# Patient Record
Sex: Male | Born: 1935
Health system: Southern US, Community
[De-identification: ages and names within clinical notes are randomized; demographics above are authoritative.]

## PROBLEM LIST (undated history)

## (undated) DIAGNOSIS — Z9289 Personal history of other medical treatment: Secondary | ICD-10-CM

## (undated) DIAGNOSIS — M1990 Unspecified osteoarthritis, unspecified site: Secondary | ICD-10-CM

## (undated) DIAGNOSIS — S72001A Fracture of unspecified part of neck of right femur, initial encounter for closed fracture: Secondary | ICD-10-CM

## (undated) DIAGNOSIS — R001 Bradycardia, unspecified: Secondary | ICD-10-CM

## (undated) DIAGNOSIS — H269 Unspecified cataract: Secondary | ICD-10-CM

## (undated) DIAGNOSIS — R2689 Other abnormalities of gait and mobility: Secondary | ICD-10-CM

## (undated) DIAGNOSIS — I10 Essential (primary) hypertension: Secondary | ICD-10-CM

## (undated) DIAGNOSIS — G4733 Obstructive sleep apnea (adult) (pediatric): Secondary | ICD-10-CM

## (undated) DIAGNOSIS — N400 Enlarged prostate without lower urinary tract symptoms: Secondary | ICD-10-CM

## (undated) DIAGNOSIS — G20A1 Parkinson's disease without dyskinesia, without mention of fluctuations: Secondary | ICD-10-CM

## (undated) DIAGNOSIS — G20 Parkinson's disease: Secondary | ICD-10-CM

## (undated) DIAGNOSIS — I4891 Unspecified atrial fibrillation: Secondary | ICD-10-CM

## (undated) DIAGNOSIS — S92109A Unspecified fracture of unspecified talus, initial encounter for closed fracture: Secondary | ICD-10-CM

## (undated) DIAGNOSIS — I2510 Atherosclerotic heart disease of native coronary artery without angina pectoris: Secondary | ICD-10-CM

## (undated) DIAGNOSIS — K219 Gastro-esophageal reflux disease without esophagitis: Secondary | ICD-10-CM

## (undated) DIAGNOSIS — I442 Atrioventricular block, complete: Secondary | ICD-10-CM

## (undated) HISTORY — DX: Unspecified cataract: H26.9

## (undated) HISTORY — PX: PROSTATE BIOPSY: SHX241

## (undated) HISTORY — DX: Benign prostatic hyperplasia without lower urinary tract symptoms: N40.0

## (undated) HISTORY — PX: FRACTURE SURGERY: SHX138

## (undated) HISTORY — DX: Obstructive sleep apnea (adult) (pediatric): G47.33

## (undated) HISTORY — PX: INGUINAL HERNIA REPAIR: SUR1180

## (undated) HISTORY — PX: COLONOSCOPY: SHX174

## (undated) HISTORY — DX: Bradycardia, unspecified: R00.1

## (undated) HISTORY — PX: HEMORROIDECTOMY: SUR656

## (undated) HISTORY — DX: Other abnormalities of gait and mobility: R26.89

## (undated) HISTORY — DX: Atherosclerotic heart disease of native coronary artery without angina pectoris: I25.10

## (undated) HISTORY — DX: Personal history of other medical treatment: Z92.89

## (undated) HISTORY — DX: Unspecified atrial fibrillation: I48.91

---

## 2005-04-08 ENCOUNTER — Ambulatory Visit: Payer: Self-pay | Admitting: Cardiology

## 2005-05-14 ENCOUNTER — Ambulatory Visit: Payer: Self-pay | Admitting: Cardiology

## 2005-05-14 ENCOUNTER — Encounter: Payer: Self-pay | Admitting: Cardiology

## 2005-05-14 ENCOUNTER — Ambulatory Visit: Payer: Self-pay

## 2005-06-22 ENCOUNTER — Ambulatory Visit: Payer: Self-pay | Admitting: Internal Medicine

## 2005-09-14 ENCOUNTER — Ambulatory Visit: Payer: Self-pay | Admitting: Cardiology

## 2007-09-09 ENCOUNTER — Ambulatory Visit (HOSPITAL_COMMUNITY): Admission: RE | Admit: 2007-09-09 | Discharge: 2007-09-09 | Payer: Self-pay | Admitting: Emergency Medicine

## 2007-09-09 ENCOUNTER — Ambulatory Visit: Payer: Self-pay | Admitting: *Deleted

## 2007-09-09 ENCOUNTER — Encounter: Payer: Self-pay | Admitting: Emergency Medicine

## 2008-12-24 ENCOUNTER — Encounter: Payer: Self-pay | Admitting: Cardiology

## 2010-07-10 NOTE — Assessment & Plan Note (Signed)
[facility]                              CARDIOLOGY OFFICE NOTE   NAME:Chad Russell, Chad Russell                      MRN:          [id number]  DATE:09/14/2005                            DOB:          11-15-1926    PRIMARY CARE PHYSICIAN:  Jonita Albee, MD   REASON FOR PRESENTATION:  Evaluate patient with complete heart block.   HISTORY OF PRESENT ILLNESS:  The patient returns for followup of the above.  He has seen Dr. Graciela Husbands in consultation.  He does have a complete heart block,  but no absolute indication for a pacemaker.  Dr. Graciela Husbands reviewed this at the  [employer].  There was a split opinion about whether the  electrophysiology experts at that meeting would put in a pacemaker.  This  has been discussed at length now with Chad Russell.  He has done quite a bit of  research and comes today to discuss this.   He still denies any symptoms.  He is quite active. He denies any presyncope  or syncope.  He has had no chest pain.  He has had no shortness of breath.  He has no palpitations.   PAST MEDICAL HISTORY:  1.  Hypertension.  2.  Bradycardia.  3.  Hemorrhoidectomy.   ALLERGIES:  None.   MEDICATIONS:  1.  Norvasc 10 mg daily.  2.  Aspirin 1/4 of a 325 mg daily.  3.  Ocuvite.  4.  Saw palmetto.  5.  Multivitamin.   REVIEW OF SYSTEMS:  Positive for mild lower extremity swelling.   PHYSICAL EXAMINATION:  GENERAL:  The patient is in no distress.  VITAL SIGNS:  Blood pressure 146/90, heart rate 40 and regular.  HEENT:  Eyes unremarkable, pupils equal, round and reactive to light, fundi  not visualized.  NECK:  No jugular venous distention, 45 degrees, carotid upstroke brisk and  symmetric, no bruits, no thyromegaly.  LYMPHATICS:  No adenopathy.  LUNGS:  Clear to auscultation bilaterally.  BACK:  No costovertebral angle tenderness.  CHEST:  Unremarkable.  HEART:  PMI not displaced or sustained, S1 and S2 within normal limits, no  S3, no  S4.  No murmurs.  ABDOMEN: Flat, positive bowel sounds, normal in frequency and pitch, no  bruits, no rebound, no guarding, no midline pulse, no masses, no  organomegaly.  SKIN:  No rashes, no nodules.  EXTREMITIES:  2+ pulses throughout.  No edema, no cyanosis, no clubbing.  NEUROLOGIC:  Grossly intact throughout.   EKG sinus rhythm, rate 60, complete heart block, narrow complex escape  rhythm, rate 40, no acute ST-T wave changes.   ASSESSMENT AND PLAN:  1.  Heart block.  Again we have had a very long discussion (greater than      half hour) with this entity.  There is no class I or IIA indication for      pacing at this point.  There is a very slight risk potentially of sudden      cardiac death, and we have reviewed this.  There is a risk of symptoms  to include syncope.  However, this is weighed against the risks of      putting in a pacemaker, which I have described in detail.  I would      suggest a pacemaker.  However, the patient at this point would still      like to watch this expectantly.  He will let me know at the first sign      of any symptoms.  2.  Hypertension.  His blood pressure is very slightly elevated.  He thinks      he has a little white coat.  He is tolerating the Norvasc.  He will keep      a blood pressure diary and share this with Dr. Perrin Maltese or myself.  3.  Followup.  See him back in 6 months, or sooner if needed.                               Rollene Rotunda, MD, [facility]    JH/MedQ  DD:  09/14/2005  DT:  09/15/2005  Job #:  [id number]   cc:   Jonita Albee, MD

## 2011-03-11 ENCOUNTER — Ambulatory Visit: Payer: Medicare Other | Attending: Cardiology | Admitting: Physical Therapy

## 2011-03-15 ENCOUNTER — Emergency Department (HOSPITAL_COMMUNITY): Payer: Medicare Other

## 2011-03-15 ENCOUNTER — Inpatient Hospital Stay (HOSPITAL_COMMUNITY)
Admission: EM | Admit: 2011-03-15 | Discharge: 2011-03-17 | DRG: 280 | Disposition: A | Discharge status: Home / Self Care | Payer: Medicare Other | Source: Ambulatory Visit | Attending: Cardiology | Admitting: Cardiology

## 2011-03-15 ENCOUNTER — Encounter (HOSPITAL_COMMUNITY): Payer: Self-pay | Admitting: Emergency Medicine

## 2011-03-15 ENCOUNTER — Other Ambulatory Visit: Payer: Self-pay

## 2011-03-15 DIAGNOSIS — I214 Non-ST elevation (NSTEMI) myocardial infarction: Principal | ICD-10-CM | POA: Diagnosis present

## 2011-03-15 DIAGNOSIS — I498 Other specified cardiac arrhythmias: Secondary | ICD-10-CM | POA: Diagnosis present

## 2011-03-15 DIAGNOSIS — I10 Essential (primary) hypertension: Secondary | ICD-10-CM | POA: Diagnosis present

## 2011-03-15 DIAGNOSIS — R079 Chest pain, unspecified: Secondary | ICD-10-CM

## 2011-03-15 DIAGNOSIS — I442 Atrioventricular block, complete: Secondary | ICD-10-CM

## 2011-03-15 DIAGNOSIS — I469 Cardiac arrest, cause unspecified: Secondary | ICD-10-CM | POA: Diagnosis not present

## 2011-03-15 DIAGNOSIS — I2510 Atherosclerotic heart disease of native coronary artery without angina pectoris: Secondary | ICD-10-CM | POA: Diagnosis present

## 2011-03-15 HISTORY — DX: Atrioventricular block, complete: I44.2

## 2011-03-15 HISTORY — DX: Essential (primary) hypertension: I10

## 2011-03-15 LAB — POCT I-STAT, CHEM 8
Calcium, Ion: 1.21 mmol/L (ref 1.12–1.32)
Creatinine, Ser: 1.1 mg/dL (ref 0.50–1.35)
Hemoglobin: 17 g/dL (ref 13.0–17.0)
Sodium: 141 mEq/L (ref 135–145)
TCO2: 28 mmol/L (ref 0–100)

## 2011-03-15 LAB — DIFFERENTIAL
Basophils Absolute: 0 10*3/uL (ref 0.0–0.1)
Eosinophils Relative: 0 % (ref 0–5)
Lymphocytes Relative: 15 % (ref 12–46)
Neutro Abs: 8.5 10*3/uL — ABNORMAL HIGH (ref 1.7–7.7)

## 2011-03-15 LAB — COMPREHENSIVE METABOLIC PANEL
ALT: 15 U/L (ref 0–53)
Alkaline Phosphatase: 51 U/L (ref 39–117)
BUN: 20 mg/dL (ref 6–23)
CO2: 27 mEq/L (ref 19–32)
Chloride: 102 mEq/L (ref 96–112)
GFR calc Af Amer: 87 mL/min — ABNORMAL LOW (ref >90)
GFR calc non Af Amer: 75 mL/min — ABNORMAL LOW (ref >90)
Glucose, Bld: 91 mg/dL (ref 70–99)
Potassium: 4.7 mEq/L (ref 3.5–5.1)
Total Bilirubin: 0.5 mg/dL (ref 0.3–1.2)
Total Protein: 6.5 g/dL (ref 6.0–8.3)

## 2011-03-15 LAB — CARDIAC PANEL(CRET KIN+CKTOT+MB+TROPI)
CK, MB: 5.8 ng/mL — ABNORMAL HIGH (ref 0.3–4.0)
Total CK: 161 U/L (ref 7–232)
Troponin I: 1.39 ng/mL (ref <0.30)

## 2011-03-15 LAB — CBC
Platelets: 270 10*3/uL (ref 150–400)
RDW: 13.4 % (ref 11.5–15.5)
WBC: 11 10*3/uL — ABNORMAL HIGH (ref 4.0–10.5)

## 2011-03-15 LAB — APTT: aPTT: 33 seconds (ref 24–37)

## 2011-03-15 LAB — PROTIME-INR: INR: 0.96 (ref 0.00–1.49)

## 2011-03-15 LAB — POCT I-STAT TROPONIN I

## 2011-03-15 MED ORDER — ROSUVASTATIN CALCIUM 20 MG PO TABS
20.0000 mg | ORAL_TABLET | Freq: Every day | ORAL | Status: DC
  Administered 2011-03-16 – 2011-03-17 (×3): 20 mg via ORAL
  Filled 2011-03-15 (×4): qty 1

## 2011-03-15 MED ORDER — MORPHINE SULFATE 4 MG/ML IJ SOLN
4.0000 mg | Freq: Once | INTRAMUSCULAR | Status: AC
  Administered 2011-03-15: 4 mg via INTRAVENOUS
  Filled 2011-03-15: qty 1

## 2011-03-15 MED ORDER — ASPIRIN 81 MG PO CHEW
324.0000 mg | CHEWABLE_TABLET | ORAL | Status: AC
  Administered 2011-03-16: 324 mg via ORAL
  Filled 2011-03-15 (×2): qty 4

## 2011-03-15 MED ORDER — LISINOPRIL 10 MG PO TABS
10.0000 mg | ORAL_TABLET | Freq: Every day | ORAL | Status: DC
  Administered 2011-03-16 – 2011-03-17 (×2): 10 mg via ORAL
  Filled 2011-03-15 (×3): qty 1

## 2011-03-15 MED ORDER — ONDANSETRON HCL 4 MG/2ML IJ SOLN: 4.0000 mg | Freq: Four times a day (QID) | INTRAMUSCULAR | Status: DC | PRN

## 2011-03-15 MED ORDER — NITROGLYCERIN IN D5W 200-5 MCG/ML-% IV SOLN
5.0000 ug/min | INTRAVENOUS | Status: DC
  Administered 2011-03-15: 5 ug/min via INTRAVENOUS
  Filled 2011-03-15: qty 250

## 2011-03-15 MED ORDER — ASPIRIN 81 MG PO CHEW
324.0000 mg | CHEWABLE_TABLET | ORAL | Status: AC
  Administered 2011-03-16: 324 mg via ORAL

## 2011-03-15 MED ORDER — ACETAMINOPHEN 325 MG PO TABS: 650.0000 mg | ORAL_TABLET | ORAL | Status: DC | PRN

## 2011-03-15 MED ORDER — NITROGLYCERIN 2 % TD OINT
1.0000 [in_us] | TOPICAL_OINTMENT | Freq: Once | TRANSDERMAL | Status: AC
  Administered 2011-03-15: 1 [in_us] via TOPICAL
  Filled 2011-03-15: qty 30

## 2011-03-15 MED ORDER — SODIUM CHLORIDE 0.9 % IJ SOLN: 3.0000 mL | INTRAMUSCULAR | Status: DC | PRN

## 2011-03-15 MED ORDER — HYDROCHLOROTHIAZIDE 12.5 MG PO CAPS
12.5000 mg | ORAL_CAPSULE | Freq: Every day | ORAL | Status: DC
  Administered 2011-03-17: 12.5 mg via ORAL
  Filled 2011-03-15 (×3): qty 1

## 2011-03-15 MED ORDER — ONDANSETRON HCL 4 MG/2ML IJ SOLN
4.0000 mg | Freq: Once | INTRAMUSCULAR | Status: AC
  Administered 2011-03-15: 4 mg via INTRAVENOUS
  Filled 2011-03-15: qty 2

## 2011-03-15 MED ORDER — MORPHINE SULFATE 2 MG/ML IJ SOLN
2.0000 mg | Freq: Once | INTRAMUSCULAR | Status: AC
  Administered 2011-03-15: 2 mg via INTRAVENOUS
  Filled 2011-03-15: qty 1

## 2011-03-15 MED ORDER — ASPIRIN EC 81 MG PO TBEC
81.0000 mg | DELAYED_RELEASE_TABLET | Freq: Every day | ORAL | Status: DC
  Administered 2011-03-17: 81 mg via ORAL
  Filled 2011-03-15: qty 1

## 2011-03-15 MED ORDER — SODIUM CHLORIDE 0.9 % IV SOLN
1.0000 mL/kg/h | INTRAVENOUS | Status: DC
  Administered 2011-03-16: 1.095 mL/kg/h via INTRAVENOUS

## 2011-03-15 MED ORDER — ASPIRIN 300 MG RE SUPP: 300.0000 mg | RECTAL | Status: AC

## 2011-03-15 MED ORDER — HEPARIN SOD (PORCINE) IN D5W 100 UNIT/ML IV SOLN
1300.0000 [IU]/h | INTRAVENOUS | Status: DC
  Administered 2011-03-15: 1100 [IU]/h via INTRAVENOUS
  Filled 2011-03-15 (×2): qty 250

## 2011-03-15 MED ORDER — NITROGLYCERIN 0.4 MG SL SUBL: 0.4000 mg | SUBLINGUAL_TABLET | SUBLINGUAL | Status: DC | PRN

## 2011-03-15 MED ORDER — SODIUM CHLORIDE 0.9 % IJ SOLN
3.0000 mL | Freq: Two times a day (BID) | INTRAMUSCULAR | Status: DC
  Administered 2011-03-16: 02:00:00 via INTRAVENOUS
  Administered 2011-03-16: 3 mL via INTRAVENOUS

## 2011-03-15 MED ORDER — HEPARIN BOLUS VIA INFUSION
4000.0000 [IU] | Freq: Once | INTRAVENOUS | Status: AC
  Administered 2011-03-15: 4000 [IU] via INTRAVENOUS
  Filled 2011-03-15: qty 4000

## 2011-03-15 MED ORDER — SODIUM CHLORIDE 0.9 % IV SOLN: 250.0000 mL | INTRAVENOUS | Status: DC | PRN

## 2011-03-15 NOTE — ED Provider Notes (Signed)
History     CSN: [id number]  Arrival date & time 03/15/11  1551   First MD Initiated Contact with Patient 03/15/11 1625      Chief Complaint  Patient presents with  . Chest Pain    (Consider location/radiation/quality/duration/timing/severity/associated sxs/prior treatment) HPI  Patient relates he was reviewing documents and was getting a little aggravated about noon and he started developing an achy constant pain in the center of his chest. He denies radiation of the pain. He states it feels like he's been lifting something heavy and he has sore muscles. He states nothing makes it feel better, nothing makes it feel worse. He states he would rate it at a level of 2 or 3 on pain scale. He denies nausea, vomiting, diaphoresis, shortness of breath, feeling dizzy, or lightheaded. He states he's never had this before. He relates about 1:30 he took 4 baby aspirin  Patient states he has a lifelong history of bradycardia and his heart rate can be 35 without symptoms. He states he has been evaluated by Dr. Antoine Poche in 2007 and has had an echo and stress test a couple years ago that were fine. He states Dr Antoine Poche took his EKG's to a conference and the general consensus was to do no intervention. Pt does admit he has felt off balance when standing for the past 18 months that he relates to a bad episode of poison ivy on his face that had him bed ridden for 2-3 days 18 months ago.  PCP Dr Christen Butter at [facility] Cardiologist Dr Antoine Poche  Past Medical History  Diagnosis Date  . Hypertension   Bradycardia  Past Surgical History  Procedure Date  . Hemmorhoidectomy     No family history on file.  History  Substance Use Topics  . Smoking status: Never Smoker   . Smokeless tobacco: Not on file  . 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    Allergies  Review of patient's allergies indicates no known allergies.  Home Medications   Current Outpatient Rx  Name Route Sig Dispense Refill  . ASPIRIN 81 MG PO CHEW Oral Chew 324 mg by mouth as needed. Chest pain    . BIOTIN 10 MG PO TABS Oral Take 1 tablet by mouth daily.    Marland Kitchen VITAMIN D 1000 UNITS PO TABS Oral Take 1,000 Units by mouth daily.    . OMEGA-3 FATTY ACIDS 1000 MG PO CAPS Oral Take 1 g by mouth daily.    Marland Kitchen HYDROCHLOROTHIAZIDE 12.5 MG PO CAPS Oral Take 12.5 mg by mouth daily.    Marland Kitchen LISINOPRIL 10 MG PO TABS Oral Take 10 mg by mouth daily.    Marland Kitchen VITAMIN B-12 1000 MCG PO TABS Oral Take 1,000 mcg by mouth daily.    Marland Kitchen VITAMIN C 500 MG PO TABS Oral Take 500 mg by mouth daily.      BP 159/99  Pulse 56  Temp(Src) 98.1 F (36.7 C) (Oral)  Resp 20  SpO2 100%  Vital signs normal except for bradycardia   Physical Exam  Nursing note and vitals reviewed. Constitutional: He is oriented to person, place, and time. He appears well-developed and well-nourished.  Non-toxic appearance. He does not appear ill. No distress.  HENT:  Head: Normocephalic and atraumatic.  Right Ear: External  ear normal.  Left Ear: External ear normal.  Nose: Nose normal. No mucosal edema or rhinorrhea.  Mouth/Throat: Oropharynx is clear and moist and mucous membranes are normal. No dental abscesses or uvula swelling.  Eyes: Conjunctivae and EOM are normal. Pupils are equal, round, and reactive to light.  Neck: Normal range of motion and full passive range of motion without pain. Neck supple.  Cardiovascular: Regular rhythm and normal heart sounds.  Bradycardia present.  Exam reveals no gallop and no friction rub.   No murmur heard. Pulmonary/Chest: Effort normal and breath sounds normal. No respiratory distress. He has no wheezes. He has no rhonchi. He has no rales. He exhibits no tenderness and no crepitus.  Abdominal: Soft. Normal appearance and bowel sounds are normal. He exhibits no  distension. There is no tenderness. There is no rebound and no guarding.  Musculoskeletal: Normal range of motion. He exhibits no edema and no tenderness.       Moves all extremities well.   Neurological: He is alert and oriented to person, place, and time. He has normal strength. No cranial nerve deficit.  Skin: Skin is warm, dry and intact. No rash noted. No erythema. No pallor.  Psychiatric: He has a normal mood and affect. His speech is normal and behavior is normal. His mood appears not anxious.    ED Course  Procedures (including critical care time)  Pt had NTG paste applied and given low dose morphine.  After receiving the results of his troponin, pt was started on NTG drip and given more morphine for pain. He was also started on heparin per pharmacy.  Pt had taken appropriate dose of ASA about 1:30 pm at home.   17:30  Cardiology will come see patient.   Results for orders placed during the hospital encounter of 03/15/11  CBC      Component Value Range   WBC 11.0 (*) 4.0 - 10.5 (K/uL)   RBC 4.77  4.22 - 5.81 (MIL/uL)   Hemoglobin 15.5  13.0 - 17.0 (g/dL)   HCT 16.1  09.6 - 04.5 (%)   MCV 95.6  78.0 - 100.0 (fL)   MCH 32.5  26.0 - 34.0 (pg)   MCHC 34.0  30.0 - 36.0 (g/dL)   RDW 40.9  81.1 - 91.4 (%)   Platelets 270  150 - 400 (K/uL)  DIFFERENTIAL      Component Value Range   Neutrophils Relative 77  43 - 77 (%)   Neutro Abs 8.5 (*) 1.7 - 7.7 (K/uL)   Lymphocytes Relative 15  12 - 46 (%)   Lymphs Abs 1.6  0.7 - 4.0 (K/uL)   Monocytes Relative 7  3 - 12 (%)   Monocytes Absolute 0.8  0.1 - 1.0 (K/uL)   Eosinophils Relative 0  0 - 5 (%)   Eosinophils Absolute 0.0  0.0 - 0.7 (K/uL)   Basophils Relative 0  0 - 1 (%)   Basophils Absolute 0.0  0.0 - 0.1 (K/uL)  POCT I-STAT, CHEM 8      Component Value Range   Sodium 141  135 - 145 (mEq/L)   Potassium 4.1  3.5 - 5.1 (mEq/L)   Chloride 105  96 - 112 (mEq/L)   BUN 23  6 - 23 (mg/dL)   Creatinine, Ser 7.82  0.50 -  1.35 (mg/dL)   Glucose, Bld 956 (*) 70 - 99 (mg/dL)   Calcium, Ion 2.13  0.86 - 1.32 (mmol/L)   TCO2 28  0 - 100 (  mmol/L)   Hemoglobin 17.0  13.0 - 17.0 (g/dL)   HCT 81.1  91.4 - 78.2 (%)  POCT I-STAT TROPONIN I      Component Value Range   Troponin i, poc 0.11 (*) 0.00 - 0.08 (ng/mL)   Comment NOTIFIED PHYSICIAN     Comment 3            Laboratory interpretation all normal except positive troponin, leukocytosis   Dg Chest Port 1 View  03/15/2011  *RADIOLOGY REPORT*  Clinical Data: Chest pain and hypertension.  PORTABLE CHEST - 1 VIEW  Comparison: CT of chest dated 04/16/2005  Findings: Bilateral calcified pleural plaques and chronic lung disease present.  No evidence of edema, infiltrate or pleural effusion.  Heart is mildly enlarged.  There is a thoracic scoliosis.  IMPRESSION: Evidence of chronic lung disease with bilateral calcified pleural plaques.  Mild cardiomegaly without edema.  Original Report Authenticated By: Reola Calkins, M.D.     Date: 03/15/2011  Rate: 40  Rhythm: 3rd degree heart block  QRS Axis: normal  Intervals: normal  ST/T Wave abnormalities: nonspecific ST changes  Conduction Disutrbances:none  Narrative Interpretation:   Old EKG Reviewed: none available   Diagnoses that have been ruled out:  None  Diagnoses that are still under consideration:  None  Final diagnoses:  NSTEMI (non-ST elevated myocardial infarction)  Complete heart block    Plan admission to cardiology  Devoria Albe, MD, FACEP    MDM          Ward Givens, MD 03/16/11 1057

## 2011-03-15 NOTE — ED Notes (Addendum)
Keep nitro drip infusing unless BP goes below 90 systolic per Dr. [place]

## 2011-03-15 NOTE — H&P (Signed)
Physician History and Physical    Patient ID: Chad Russell MRN: [id number] DOB/AGE: 1926-06-29 78 y.o. Admit date: 03/15/2011  Primary Care Physician: Karyl Kinnier MD Primary Cardiologist: Angelina Sheriff MD  HPI: Chad Russell is a very pleasant 78 year old white male who presents to the emergency department today with complaint of chest pain. He has a history of chronic complete heart block. His heart rate normally runs in the 40s. He remains very active and still works as a Runner, broadcasting/film/video. Today he was dealing with some stressful legal matters when he developed acute onset of mid substernal chest pain. This did not radiate. He denied any shortness of breath, diaphoresis, or nausea. His pain persisted and was graded at 3/10. He came to the emergency room and he has had mild relief with nitrates and morphine. He has no prior history of coronary disease. He does have a history of hypertension. There is no history of diabetes, hypercholesterolemia, or tobacco use. There is no family history of cardiac disease. This patient has a long history of complete heart block. There was a fairly extensive evaluation in 2007 with consideration for pacemaker implant. Patient states his heart rate has always been in the 40s and he has been completely asymptomatic. He deferred having a pacemaker placed. He has not had regular cardiac followup since then.  Review of systems is positive for a sense of imbalance. He states this began about 18 months ago after he had a breakout of poison ivy rash and also received an insect bite on his neck. He did undergo recent neurologic evaluation to test his balance and he passed without any difficulty. Past Medical History  Diagnosis Date  . Hypertension   . Complete heart block     No family history on file.  History   Social History  . Marital Status: Married    Spouse Name: N/A    Number of Children: 0  . Years of Education: N/A   Occupational History  . ski  Secondary school teacher   . civil Art gallery manager    Social History Main Topics  . Smoking status: Never Smoker   . Smokeless tobacco: Not on file  . Alcohol Use: 1.2 oz/week    2 Glasses of wine per week     daily  . Drug Use: No  . Sexually Active:    Other Topics Concern  . Not on file   Social History Narrative  . No narrative on file    Past Surgical History  Procedure Date  . Hemmorhoidectomy       Physical Exam: Blood pressure 159/99, pulse 56, temperature 98.1 F (36.7 C), temperature source Oral, resp. rate 20, height 6\' 1"  (1.854 m), weight 77.565 kg (171 lb), SpO2 100.00%. he is a pleasant elderly white male who appears younger than his stated age. He is normocephalic, atraumatic. Pupils are equal round and reactive to light accommodation. Sclera are clear. Oropharynx is clear. Neck is supple without JVD, adenopathy, thyromegaly, or bruits. Lungs are clear. Cardiac exam reveals a regular rate and rhythm with bradycardia. S1 and S2 are normal. There are no murmurs, rubs, or clicks. Abdomen is soft and nontender. No masses or bruits. There is no hepatosplenomegaly. Femoral and pedal pulses are 2+ and symmetric. He is alert and oriented x3. Cranial nerves II through XII are intact. Skin is warm and dry.  Labs:   Lab Results  Component Value Date   WBC 11.0* 03/15/2011   HGB 17.0 03/15/2011   HCT 50.0 03/15/2011  MCV 95.6 03/15/2011   PLT 270 03/15/2011     Lab 03/15/11 1707  NA 141  K 4.1  CL 105  CO2 --  BUN 23  CREATININE 1.10  CALCIUM --  PROT --  BILITOT --  ALKPHOS --  ALT --  AST --  GLUCOSE 102*   POC troponin is .11    No results found for this basename: CHOL   No results found for this basename: HDL   No results found for this basename: LDLCALC   No results found for this basename: TRIG   No results found for this basename: CHOLHDL   No results found for this basename: LDLDIRECT      Radiology: Chest x-ray shows bilateral calcified pleural plaques with  some chronic lung disease. There is mild cardiomegaly. EKG: Normal sinus rhythm with complete heart block and AV dissociation. There is a narrow complex escape with nonspecific ST abnormality.   ASSESSMENT AND PLAN:   1. Acute chest pain. Elevated troponin suggests this is a non-ST elevation myocardial infarction. Patient has continued low-grade chest pain.  2. Complete heart block, chronic with narrow complex escape. Patient is asymptomatic.  3. Hypertension.  4. Disequilibrium.   Plan:  Patient will be admitted to step down unit. Given the need for acute cardiac care we will transfer him to [facility]. We will initiate antianginal therapy with aspirin, IV heparin, and IV nitroglycerin. We will need to avoid beta blockers or any other rate slowing medications because of his bradycardia. We'll continue on his current antihypertensive therapy. We'll cycle cardiac enzymes and ECG. His subsequent enzymes are positive I would recommend invasive evaluation with coronary angiography tomorrow. We will start statin therapy.  SignedTheron Arista [facility] 03/15/2011, 6:19 PM

## 2011-03-15 NOTE — ED Notes (Signed)
Nitro patch removed before nitro infusion

## 2011-03-15 NOTE — ED Notes (Signed)
Critical lab value: Troponin 0.139

## 2011-03-15 NOTE — ED Notes (Signed)
Pt presenting to ed with c/o chest soreness pt states no shortness of breath, no nausea, no vomiting, no back pain. Pt is alert and oriented at this time. Pt states his blood pressure was elevated today and his pulse was in the 40's at which is high for him his pulse is normally in the 30's.

## 2011-03-15 NOTE — Progress Notes (Signed)
ANTICOAGULATION CONSULT NOTE - Initial Consult  Pharmacy Consult for IV Heparin Indication: chest pain/ACS  No Known Allergies  Patient Measurements: Patient stated height: 73" Patient stated weight: 187 lbs (85 kg) IBW: 79.9 kg Heparin Dosing Weight: actual body weight -> 85 kg per patient  Vital Signs: Temp: 98.1 F (36.7 C) (01/21 1607) Temp src: Oral (01/21 1607) BP: 159/99 mmHg (01/21 1607) Pulse Rate: 56  (01/21 1607)  Labs:  Basename 03/15/11 1707 03/15/11 1700  HGB 17.0 15.5  HCT 50.0 45.6  PLT -- 270  APTT -- 33  LABPROT -- 13.0  INR -- 0.96  HEPARINUNFRC -- --  CREATININE 1.10 --  CKTOTAL -- --  CKMB -- --  TROPONINI -- --   CrCl is unknown because there is no height on file for the current visit.  Medical History: Past Medical History  Diagnosis Date  . Hypertension     Medications:  Scheduled:    .  morphine injection  2 mg Intravenous Once  .  morphine injection  4 mg Intravenous Once  . nitroGLYCERIN  1 inch Topical Once  . ondansetron (ZOFRAN) IV  4 mg Intravenous Once   Infusions:    . nitroGLYCERIN      Assessment: Chad Russell presenting with chest pain to begin IV heparin for ACS/STEMI. SCr 1.10  Goal of Therapy:  Heparin level 0.3-0.7 units/ml   Plan:  Heparin 4000 units IV bolus x1 then begin infusion at 1100 units/hr (11 ml/hr). Follow up 8 hour heparin level. Daily heparin levels and CBC.  Clance Boll 03/15/2011,6:05 PM

## 2011-03-16 ENCOUNTER — Encounter (HOSPITAL_COMMUNITY): Payer: Self-pay | Admitting: *Deleted

## 2011-03-16 ENCOUNTER — Other Ambulatory Visit: Payer: Self-pay

## 2011-03-16 ENCOUNTER — Encounter (HOSPITAL_COMMUNITY)
Admission: EM | Disposition: A | Discharge status: Home / Self Care | Payer: Self-pay | Source: Ambulatory Visit | Attending: Cardiology

## 2011-03-16 DIAGNOSIS — I059 Rheumatic mitral valve disease, unspecified: Secondary | ICD-10-CM

## 2011-03-16 DIAGNOSIS — I214 Non-ST elevation (NSTEMI) myocardial infarction: Secondary | ICD-10-CM

## 2011-03-16 HISTORY — PX: LEFT HEART CATHETERIZATION WITH CORONARY ANGIOGRAM: SHX5451

## 2011-03-16 LAB — BASIC METABOLIC PANEL
Calcium: 9.2 mg/dL (ref 8.4–10.5)
GFR calc Af Amer: 86 mL/min — ABNORMAL LOW (ref >90)
GFR calc non Af Amer: 74 mL/min — ABNORMAL LOW (ref >90)
Glucose, Bld: 87 mg/dL (ref 70–99)
Sodium: 140 mEq/L (ref 135–145)

## 2011-03-16 LAB — LIPID PANEL
HDL: 62 mg/dL (ref >39)
LDL Cholesterol: 73 mg/dL (ref 0–99)
Total CHOL/HDL Ratio: 2.4 RATIO

## 2011-03-16 LAB — CBC
MCH: 32.3 pg (ref 26.0–34.0)
MCHC: 33.6 g/dL (ref 30.0–36.0)
Platelets: 210 10*3/uL (ref 150–400)
RBC: 4.18 MIL/uL — ABNORMAL LOW (ref 4.22–5.81)

## 2011-03-16 LAB — HEPARIN LEVEL (UNFRACTIONATED): Heparin Unfractionated: 0.21 IU/mL — ABNORMAL LOW (ref 0.30–0.70)

## 2011-03-16 LAB — TSH: TSH: 4.001 u[IU]/mL (ref 0.350–4.500)

## 2011-03-16 LAB — CARDIAC PANEL(CRET KIN+CKTOT+MB+TROPI)
CK, MB: 6.4 ng/mL (ref 0.3–4.0)
Troponin I: 0.95 ng/mL (ref <0.30)

## 2011-03-16 SURGERY — LEFT HEART CATHETERIZATION WITH CORONARY ANGIOGRAM
Anesthesia: LOCAL

## 2011-03-16 MED ORDER — LIDOCAINE HCL (PF) 1 % IJ SOLN
INTRAMUSCULAR | Status: AC
  Filled 2011-03-16: qty 30

## 2011-03-16 MED ORDER — LISINOPRIL 10 MG PO TABS: 10.0000 mg | ORAL_TABLET | Freq: Every day | ORAL | Status: DC

## 2011-03-16 MED ORDER — OMEGA-3 FATTY ACIDS 1000 MG PO CAPS: 1.0000 g | ORAL_CAPSULE | Freq: Every day | ORAL | Status: DC

## 2011-03-16 MED ORDER — HEART ATTACK BOUNCING BOOK
Freq: Once | Status: AC
  Administered 2011-03-16: 22:00:00
  Filled 2011-03-16: qty 1

## 2011-03-16 MED ORDER — SODIUM CHLORIDE 0.9 % IV SOLN
1.0000 mL/kg/h | INTRAVENOUS | Status: AC
  Administered 2011-03-16: 1 mL/kg/h via INTRAVENOUS

## 2011-03-16 MED ORDER — MIDAZOLAM HCL 2 MG/2ML IJ SOLN
INTRAMUSCULAR | Status: AC
  Filled 2011-03-16: qty 2

## 2011-03-16 MED ORDER — VITAMIN B-12 1000 MCG PO TABS
1000.0000 ug | ORAL_TABLET | Freq: Every day | ORAL | Status: DC
  Administered 2011-03-16 – 2011-03-17 (×2): 1000 ug via ORAL
  Filled 2011-03-16 (×2): qty 1

## 2011-03-16 MED ORDER — HEPARIN SODIUM (PORCINE) 1000 UNIT/ML IJ SOLN
INTRAMUSCULAR | Status: AC
  Filled 2011-03-16: qty 1

## 2011-03-16 MED ORDER — HEPARIN (PORCINE) IN NACL 2-0.9 UNIT/ML-% IJ SOLN
INTRAMUSCULAR | Status: AC
  Filled 2011-03-16: qty 2000

## 2011-03-16 MED ORDER — CLOPIDOGREL BISULFATE 75 MG PO TABS: 75.0000 mg | ORAL_TABLET | Freq: Every day | ORAL | Status: DC

## 2011-03-16 MED ORDER — NITROGLYCERIN 0.2 MG/ML ON CALL CATH LAB
INTRAVENOUS | Status: AC
  Filled 2011-03-16: qty 1

## 2011-03-16 MED ORDER — VERAPAMIL HCL 2.5 MG/ML IV SOLN
INTRAVENOUS | Status: AC
  Filled 2011-03-16: qty 2

## 2011-03-16 MED ORDER — OMEGA-3-ACID ETHYL ESTERS 1 G PO CAPS
1.0000 g | ORAL_CAPSULE | Freq: Every day | ORAL | Status: DC
  Administered 2011-03-16 – 2011-03-17 (×2): 1 g via ORAL
  Filled 2011-03-16 (×3): qty 1

## 2011-03-16 MED ORDER — HYDROCHLOROTHIAZIDE 12.5 MG PO CAPS: 12.5000 mg | ORAL_CAPSULE | Freq: Every day | ORAL | Status: DC

## 2011-03-16 MED ORDER — VITAMIN D3 25 MCG (1000 UNIT) PO TABS
1000.0000 [IU] | ORAL_TABLET | Freq: Every day | ORAL | Status: DC
  Administered 2011-03-16 – 2011-03-17 (×2): 1000 [IU] via ORAL
  Filled 2011-03-16 (×2): qty 1

## 2011-03-16 NOTE — Op Note (Signed)
Cardiac Catheterization Procedure Note  Name: Chad Russell MRN: [id number] DOB: Feb 19, 1927  Procedure: Left Heart Cath, Selective Coronary Angiography, LV angiography  Indication: 78 year old white male who presents with a non-ST elevation myocardial infarction.   Procedural Details: The right wrist was prepped, draped, and anesthetized with 1% lidocaine. Using the modified Seldinger technique, a 5 French sheath was introduced into the right radial artery. 3 mg of verapamil was administered through the sheath, weight-based unfractionated heparin was administered intravenously. Standard Judkins catheters were used for selective coronary angiography and left ventriculography. Catheter exchanges were performed over an exchange length guidewire. There were no immediate procedural complications. A TR band was used for radial hemostasis at the completion of the procedure.  The patient was transferred to the post catheterization recovery area for further monitoring.  Procedural Findings: Hemodynamics: AO 152/60 with a mean of 87 mmHg LV 145/16 mmHg  Coronary angiography: Coronary dominance: right  Left mainstem: Normal.  Left anterior descending (LAD): This is a large vessel that gives rise to 3 diagonal branches. There are mild irregularities in the mid vessel of approximately 10-20%.  Left circumflex (LCx): Distrust to one large obtuse marginal vessel and then terminates in a smaller marginal branch. There is minor irregularity in the proximal vessel less than 10%.  Right coronary artery (RCA): This is a dominant vessel. There is diffuse irregularity in the mid vessel up to 20%.  Left ventriculography: Left ventricular systolic function is overall normal, LVEF is estimated at 55%, there is hypokinesis of the basal anterior wall. There is no significant mitral insufficiency.  Final Conclusions:   1. Nonobstructive atherosclerotic coronary disease. 2. Good overall left ventricular  systolic function with wall motion abnormality involving the basal anterior wall.  Recommendations: Recommend medical therapy. We'll obtain an echocardiogram.  Theron Arista [facility] 03/16/2011, 12:41 PM

## 2011-03-16 NOTE — Plan of Care (Signed)
Problem: Phase I Progression Outcomes Goal: Hemodynamically stable Outcome: Completed/Met Date Met:  03/16/11 Pt stable; heart rate runs in the 30s (pt and MD report that this is pts baseline)

## 2011-03-16 NOTE — Progress Notes (Signed)
TR BAND REMOVAL  LOCATION:    right radial  DEFLATED PER PROTOCOL:    yes  TIME BAND OFF / DRESSING APPLIED:    1500   SITE UPON ARRIVAL:    Level 0  SITE AFTER BAND REMOVAL:    Level 0  REVERSE ALLEN'S TEST:     positive  CIRCULATION SENSATION AND MOVEMENT:    Within Normal Limits   yes  COMMENTS:   Tolerated procedure well 

## 2011-03-16 NOTE — Progress Notes (Signed)
2D Echo completed.  Masahiro Iglesia Johnson, RDCS 

## 2011-03-16 NOTE — ED Notes (Signed)
Report given to [facility] transport team. 

## 2011-03-16 NOTE — Interval H&P Note (Signed)
History and Physical Interval Note:  03/16/2011 11:49 AM  Chad Russell  has presented today for surgery, with the diagnosis of chest pain  The various methods of treatment have been discussed with the patient and family. After consideration of risks, benefits and other options for treatment, the patient has consented to  Procedure(s): LEFT HEART CATHETERIZATION WITH CORONARY ANGIOGRAM as a surgical intervention .  The patients' history has been reviewed, patient examined, no change in status, stable for surgery.  I have reviewed the patients' chart and labs.  Questions were answered to the patient's satisfaction.     Theron Arista [facility]

## 2011-03-16 NOTE — Progress Notes (Signed)
Subjective:  No further chest pain. Pulse remains extremely slow sinus bradycardia.  Asystole during sleep.  Objective:  Vital Signs in the last 24 hours: Temp:  [97.6 F (36.4 C)-98.1 F (36.7 C)] 97.6 F (36.4 C) (01/22 0800) Pulse Rate:  [30-88] 35  (01/22 0725) Resp:  [9-20] 14  (01/22 0725) BP: (86-159)/(42-99) 144/71 mmHg (01/22 0725) SpO2:  [91 %-100 %] 98 % (01/22 0725) Weight:  [171 lb (77.565 kg)-187 lb 2.7 oz (84.9 kg)] 187 lb 2.7 oz (84.9 kg) (01/22 0140)  Intake/Output from previous day: 01/21 0701 - 01/22 0700 In: 431 [I.V.:431] Out: 300 [Urine:300] Intake/Output from this shift: Total I/O In: 98 [I.V.:98] Out: 100 [Urine:100]     . aspirin  324 mg Oral NOW   Or  . aspirin  300 mg Rectal NOW  . aspirin  324 mg Oral Pre-Cath  . aspirin EC  81 mg Oral Daily  . heparin  4,000 Units Intravenous Once  . hydrochlorothiazide  12.5 mg Oral Daily  . lisinopril  10 mg Oral Daily  .  morphine injection  2 mg Intravenous Once  .  morphine injection  4 mg Intravenous Once  . nitroGLYCERIN  1 inch Topical Once  . ondansetron (ZOFRAN) IV  4 mg Intravenous Once  . rosuvastatin  20 mg Oral Daily  . sodium chloride  3 mL Intravenous Q12H      . sodium chloride 1.095 mL/kg/hr (03/16/11 0327)  . heparin 1,300 Units/hr (03/16/11 1610)  . nitroGLYCERIN 5 mcg/min (03/15/11 1825)    Physical Exam: The patient appears to be in no distress.  Head and neck exam reveals that the pupils are equal and reactive.  The extraocular movements are full.  There is no scleral icterus.  Mouth and pharynx are benign.  No lymphadenopathy.  No carotid bruits.  The jugular venous pressure is normal.  Thyroid is not enlarged or tender.  Chest is clear to percussion and auscultation.  No rales or rhonchi.  Expansion of the chest is symmetrical.  Heart reveals no abnormal lift or heave.  First and second heart sounds are normal.  There is no murmur gallop rub or click.  The abdomen is  soft and nontender.  Bowel sounds are normoactive.  There is no hepatosplenomegaly or mass.  There are no abdominal bruits.  Extremities reveal no phlebitis or edema.  Pedal pulses are good.  There is no cyanosis or clubbing.  Neurologic exam is normal strength and no lateralizing weakness.  No sensory deficits.  Integument reveals no rash  Lab Results:  Basename 03/16/11 0510 03/15/11 1707 03/15/11 1700  WBC 9.0 -- 11.0*  HGB 13.5 17.0 --  PLT 210 -- 270    Basename 03/16/11 0510 03/15/11 2200  NA 140 136  K 3.8 4.7  CL 106 102  CO2 26 27  GLUCOSE 87 91  BUN 19 20  CREATININE 0.99 0.95    Basename 03/16/11 0530 03/16/11 0023  TROPONINI 0.95* 1.21*   Hepatic Function Panel  Basename 03/15/11 2200  PROT 6.5  ALBUMIN 3.9  AST 25  ALT 15  ALKPHOS 51  BILITOT 0.5  BILIDIR --  IBILI --    Basename 03/16/11 0510  CHOL 146   No results found for this basename: PROTIME in the last 72 hours  Imaging: Imaging results have been reviewed  Cardiac Studies: Marked bradycardia. Assessment/Plan:   NSTEMI  No further pain. Enzymes coming down  Marked sinus bradycardia.    Present as far  back as 2007--asymptomatic.  Plan:  Cath today.   LOS: 1 day    Cassell Clement 03/16/2011, 8:31 AM

## 2011-03-16 NOTE — H&P (View-Only) (Signed)
 Subjective:  No further chest pain. Pulse remains extremely slow sinus bradycardia.  Asystole during sleep.  Objective:  Vital Signs in the last 24 hours: Temp:  [97.6 F (36.4 C)-98.1 F (36.7 C)] 97.6 F (36.4 C) (01/22 0800) Pulse Rate:  [30-88] 35  (01/22 0725) Resp:  [9-20] 14  (01/22 0725) BP: (86-159)/(42-99) 144/71 mmHg (01/22 0725) SpO2:  [91 %-100 %] 98 % (01/22 0725) Weight:  [171 lb (77.565 kg)-187 lb 2.7 oz (84.9 kg)] 187 lb 2.7 oz (84.9 kg) (01/22 0140)  Intake/Output from previous day: 01/21 0701 - 01/22 0700 In: 431 [I.V.:431] Out: 300 [Urine:300] Intake/Output from this shift: Total I/O In: 98 [I.V.:98] Out: 100 [Urine:100]     . aspirin  324 mg Oral NOW   Or  . aspirin  300 mg Rectal NOW  . aspirin  324 mg Oral Pre-Cath  . aspirin EC  81 mg Oral Daily  . heparin  4,000 Units Intravenous Once  . hydrochlorothiazide  12.5 mg Oral Daily  . lisinopril  10 mg Oral Daily  .  morphine injection  2 mg Intravenous Once  .  morphine injection  4 mg Intravenous Once  . nitroGLYCERIN  1 inch Topical Once  . ondansetron (ZOFRAN) IV  4 mg Intravenous Once  . rosuvastatin  20 mg Oral Daily  . sodium chloride  3 mL Intravenous Q12H      . sodium chloride 1.095 mL/kg/hr (03/16/11 0327)  . heparin 1,300 Units/hr (03/16/11 0638)  . nitroGLYCERIN 5 mcg/min (03/15/11 1825)    Physical Exam: The patient appears to be in no distress.  Head and neck exam reveals that the pupils are equal and reactive.  The extraocular movements are full.  There is no scleral icterus.  Mouth and pharynx are benign.  No lymphadenopathy.  No carotid bruits.  The jugular venous pressure is normal.  Thyroid is not enlarged or tender.  Chest is clear to percussion and auscultation.  No rales or rhonchi.  Expansion of the chest is symmetrical.  Heart reveals no abnormal lift or heave.  First and second heart sounds are normal.  There is no murmur gallop rub or click.  The abdomen is  soft and nontender.  Bowel sounds are normoactive.  There is no hepatosplenomegaly or mass.  There are no abdominal bruits.  Extremities reveal no phlebitis or edema.  Pedal pulses are good.  There is no cyanosis or clubbing.  Neurologic exam is normal strength and no lateralizing weakness.  No sensory deficits.  Integument reveals no rash  Lab Results:  Basename 03/16/11 0510 03/15/11 1707 03/15/11 1700  WBC 9.0 -- 11.0*  HGB 13.5 17.0 --  PLT 210 -- 270    Basename 03/16/11 0510 03/15/11 2200  NA 140 136  K 3.8 4.7  CL 106 102  CO2 26 27  GLUCOSE 87 91  BUN 19 20  CREATININE 0.99 0.95    Basename 03/16/11 0530 03/16/11 0023  TROPONINI 0.95* 1.21*   Hepatic Function Panel  Basename 03/15/11 2200  PROT 6.5  ALBUMIN 3.9  AST 25  ALT 15  ALKPHOS 51  BILITOT 0.5  BILIDIR --  IBILI --    Basename 03/16/11 0510  CHOL 146   No results found for this basename: PROTIME in the last 72 hours  Imaging: Imaging results have been reviewed  Cardiac Studies: Marked bradycardia. Assessment/Plan:   NSTEMI  No further pain. Enzymes coming down  Marked sinus bradycardia.    Present as far   back as 2007--asymptomatic.  Plan:  Cath today.   LOS: 1 day    Latwan Luchsinger 03/16/2011, 8:31 AM    

## 2011-03-16 NOTE — Progress Notes (Signed)
ANTICOAGULATION CONSULT NOTE - Follow Up Consult  Pharmacy Consult for heparin Indication: chest pain/ACS  No Known Allergies  Patient Measurements: Height: 6\' 1"  (185.4 cm) Weight: 187 lb 2.7 oz (84.9 kg) IBW/kg (Calculated) : 79.9   Vital Signs: Temp: 97.7 F (36.5 C) (01/22 0415) Temp src: Oral (01/22 0415) BP: 118/82 mmHg (01/22 0500) Pulse Rate: 33  (01/22 0500)  Labs:  Basename 03/16/11 0510 03/16/11 0023 03/15/11 2200 03/15/11 1707 03/15/11 1700  HGB 13.5 -- -- 17.0 --  HCT 40.2 -- -- 50.0 45.6  PLT 210 -- -- -- 270  APTT -- -- -- -- 33  LABPROT -- -- -- -- 13.0  INR -- -- -- -- 0.96  HEPARINUNFRC 0.21* -- -- -- --  CREATININE -- -- 0.95 1.10 --  CKTOTAL -- 172 161 -- --  CKMB -- 6.4* 5.8* -- --  TROPONINI -- 1.21* 1.39* -- --   Estimated Creatinine Clearance: 67.8 ml/min (by C-G formula based on Cr of 0.95).   Medications:  Scheduled:    . aspirin  324 mg Oral NOW   Or  . aspirin  300 mg Rectal NOW  . aspirin  324 mg Oral Pre-Cath  . aspirin EC  81 mg Oral Daily  . heparin  4,000 Units Intravenous Once  . hydrochlorothiazide  12.5 mg Oral Daily  . lisinopril  10 mg Oral Daily  .  morphine injection  2 mg Intravenous Once  .  morphine injection  4 mg Intravenous Once  . nitroGLYCERIN  1 inch Topical Once  . ondansetron (ZOFRAN) IV  4 mg Intravenous Once  . rosuvastatin  20 mg Oral Daily  . sodium chloride  3 mL Intravenous Q12H   Infusions:    . sodium chloride 1.095 mL/kg/hr (03/16/11 0327)  . heparin 1,100 Units/hr (03/15/11 2235)  . nitroGLYCERIN 5 mcg/min (03/15/11 1825)    Assessment: 78yo male subtherapeutic with initial dosing for CP.  Currently not on cath lab schedule.  Goal of Therapy:  Heparin level 0.3-0.7 units/ml   Plan:  Will increase gtt by ~2 units/kg/hr to 1300 units/hr and check level in ~8hr.  Colleen Can PharmD BCPS 03/16/2011,6:37 AM

## 2011-03-17 ENCOUNTER — Encounter (HOSPITAL_COMMUNITY): Payer: Self-pay | Admitting: Dietician

## 2011-03-17 ENCOUNTER — Other Ambulatory Visit: Payer: Self-pay

## 2011-03-17 DIAGNOSIS — I214 Non-ST elevation (NSTEMI) myocardial infarction: Principal | ICD-10-CM

## 2011-03-17 NOTE — Discharge Summary (Signed)
Discharge Summary   Patient ID: Chad Russell MRN: [id number], DOB/AGE: 06/05/1926 78 y.o. Admit date: 03/15/2011 D/C date:     03/17/2011   Primary Discharge Diagnoses:  1. NSTEMI - nonobstructive CAD with EF 55% and hypokinesis of basal anterior wall by cath 03/16/11  - EF 65-70% by echo 03/16/11 2. Chronic complete heart block - chronic and symptomatic with eval 2007 including consideration for pacemaker although patient has elected not to have one  3. HTN   Hospital Course: 78 y/o M with hx of CHB (has deferred pacemaker), HTN presented to [facility] with complaints of chest pain. He is still active and works as a Runner, broadcasting/film/video. He was dealing with stressful legal matters when he developed acute onset of mid substernal chest pain, non-radiating and not associated with any SOB, diaphoresis or nausea. He also reported a sense of disequilibrium. He came to the emergency room and he has had mild relief with nitrates and morphine. POC troponin was 0.11 felt possibly consistent with NSTEMI. EKG showed normal sinus rhythm with complete heart block and AV dissociation, narrow complex escape with nonspecific ST abnormality. He was placed on aspirin, IV heparin, IV nitroglycerin & Plavix. BB were avoided given CHB. Enzymes continued to trend upward with a peak of 1.39. Dr. Patty Sermons noted on 03/16/11 that he continued to have slow bradycardia & asystsole while asleep but was felt to be stable. Cath demonstrated nonobstructive CAD with EF 55% and hypokinesis of basal anterior wall. Follow-up 2D echo 1/22 demonstrated vigorous & normal LV function with EF 65-70%. The patient remained stable as of today. He has continued to elect not to have a pacemaker. Dr. Antoine Poche recommended no further cardiac workup. Dr. Antoine Poche has seen and examined him and feels he is stable for discharge. He would like the patient to continue prior home meds. Chad Russell ambulated well with cardiac rehab today without any  complications or complaints.  Discharge Vitals: Blood pressure 134/66, pulse 80, temperature 98.7 F (37.1 C), temperature source Oral, resp. rate 18, height 6\' 1"  (1.854 m), weight 187 lb 2.7 oz (84.9 kg), SpO2 96.00%.  Labs: Lab Results  Component Value Date   WBC 9.0 03/16/2011   HGB 13.5 03/16/2011   HCT 40.2 03/16/2011   MCV 96.2 03/16/2011   PLT 210 03/16/2011    Lab 03/16/11 0510 03/15/11 2200  NA 140 --  K 3.8 --  CL 106 --  CO2 26 --  BUN 19 --  CREATININE 0.99 --  CALCIUM 9.2 --  PROT -- 6.5  BILITOT -- 0.5  ALKPHOS -- 51  ALT -- 15  AST -- 25  GLUCOSE 87 --    Basename 03/16/11 0530 03/16/11 0023 03/15/11 2200  CKTOTAL 161 172 161  CKMB 6.1* 6.4* 5.8*  TROPONINI 0.95* 1.21* 1.39*   Lab Results  Component Value Date   CHOL 146 03/16/2011   HDL 62 03/16/2011   LDLCALC 73 03/16/2011   TRIG 56 03/16/2011    Diagnostic Studies/Procedures   1. Chest Port 1 View 03/15/2011  *RADIOLOGY REPORT*  Clinical Data: Chest pain and hypertension.  PORTABLE CHEST - 1 VIEW  Comparison: CT of chest dated 04/16/2005  Findings: Bilateral calcified pleural plaques and chronic lung disease present.  No evidence of edema, infiltrate or pleural effusion.  Heart is mildly enlarged.  There is a thoracic scoliosis.  IMPRESSION: Evidence of chronic lung disease with bilateral calcified pleural plaques.  Mild cardiomegaly without edema.  Original Report Authenticated By:  Reola Calkins, M.D.   2. 2D Echo 03/16/11 Study Conclusions - Left ventricle: The cavity size was normal. Wall thickness was normal. Systolic function was vigorous. The estimated ejection fraction was in the range of 65% to 70%. Wall motion was normal; there were no regional wall motion abnormalities. Left ventricular diastolic function parameters were normal. - Mitral valve: Calcified annulus. Mildly thickened leaflets . Mild regurgitation. - Left atrium: The atrium was mildly dilated. - Right ventricle: The cavity size was  mildly dilated.   Discharge Medications   Medication List  As of 03/17/2011 11:38 AM   TAKE these medications         aspirin 81 MG chewable tablet   Chew 324 mg by mouth as needed. Chest pain      Biotin 10 MG Tabs   Take 1 tablet by mouth daily.      cholecalciferol 1000 UNITS tablet   Commonly known as: VITAMIN D   Take 1,000 Units by mouth daily.      fish oil-omega-3 fatty acids 1000 MG capsule   Take 1 g by mouth daily.      hydrochlorothiazide 12.5 MG capsule   Commonly known as: MICROZIDE   Take 12.5 mg by mouth daily.      lisinopril 10 MG tablet   Commonly known as: PRINIVIL,ZESTRIL   Take 10 mg by mouth daily.      vitamin B-12 1000 MCG tablet   Commonly known as: CYANOCOBALAMIN   Take 1,000 mcg by mouth daily.      vitamin C 500 MG tablet   Commonly known as: ASCORBIC ACID   Take 500 mg by mouth daily.            Disposition   The patient will be discharged in stable condition to home. Discharge Orders    Future Appointments: Provider: Department: Dept Phone: Center:   04/01/2011 10:00 AM Rollene Rotunda, MD [facility] [phone] [facility]     Future Orders Please Complete By Expires   Diet - low sodium heart healthy      Increase activity slowly      Comments:   No driving for 2 days. No lifting over 5 lbs for 1 week. No sexual activity for 1 week. Keep procedure site clean & dry. If you notice increased pain, swelling, bleeding or pus, call/return!  You may shower, but no soaking baths/hot tubs/pools for 1 week.     Follow-up Information    Follow up with Rollene Rotunda, MD. (04/01/11 at 10am)    Contact information:   [address], [address] [place] [address] [phone]            Duration of Discharge Encounter: Greater than 30 minutes including physician and PA time.  Signed, Ronie Spies PA-C 03/17/2011, 11:38 AM  Patient seen and examined.  Plan as discussed in my rounding  note for today and outlined above. Rollene Rotunda  03/17/2011  5:29 PM

## 2011-03-17 NOTE — Progress Notes (Signed)
   SUBJECTIVE:  Post cath yesterday with nonobstructive disease.  LV normal with no significant valvular disease.  Denies chest pain or SOB.   PHYSICAL EXAM Filed Vitals:   03/16/11 1900 03/16/11 2045 03/17/11 0028 03/17/11 0410  BP: 124/62 140/69 109/67 133/67  Pulse: 37 35 65 36  Temp:  97.6 F (36.4 C) 98 F (36.7 C) 97.3 F (36.3 C)  TempSrc:  Oral Oral Oral  Resp:      Height:      Weight:      SpO2: 94% 98% 98% 95%   General:  No distress Lungs:  Clear Heart:  No murmur Abdomen:  Positive bowel sounds, no rebound no guarding Extremities:  Right wrist without hematoma or echymosis.  No edema  LABS: Lab Results  Component Value Date   CKTOTAL 161 03/16/2011   CKMB 6.1* 03/16/2011   TROPONINI 0.95* 03/16/2011   No results found for this or any previous visit (from the past 24 hour(s)).  Intake/Output Summary (Last 24 hours) at 03/17/11 0711 Last data filed at 03/16/11 2046  Gross per 24 hour  Intake 1644.04 ml  Output    675 ml  Net 969.04 ml   ASSESSMENT AND PLAN:   1)  NSTEMI (non-ST elevated myocardial infarction):  Non obstructive disease on cardiac catheterization.  No further cardiac work up.  Discontinue Plavix.     2) Complete heart block:  This is a chronic rhythm for many years without symptoms.  He has elected not to have a pacemaker.     3) HTN (hypertension):  At discharge continue previous medcations.  He has a follow up appt with me on Feb 7th.    Rollene Rotunda 03/17/2011 7:11 AM ;d

## 2011-03-17 NOTE — Progress Notes (Signed)
Pt quit many years ago.

## 2011-03-17 NOTE — Progress Notes (Signed)
CARDIAC REHAB PHASE I   PRE:  Rate/Rhythm: 36 CHB    BP: sitting 166/73    SaO2: 98 RA  MODE:  Ambulation: 340 ft   POST:  Rate/Rhythm: 45 CHB    BP: sitting 159/81     SaO2:   Tolerated well. No c/o. Gave MI book, walking gl and discussed CRPII. Requests his name be sent to [place] [facility].  [id number]  Harriet Masson CES, ACSM

## 2011-04-01 ENCOUNTER — Encounter: Payer: Self-pay | Admitting: Cardiology

## 2011-04-01 ENCOUNTER — Ambulatory Visit (INDEPENDENT_AMBULATORY_CARE_PROVIDER_SITE_OTHER): Payer: Medicare Other | Admitting: Cardiology

## 2011-04-01 VITALS — BP 180/80 | HR 40 | Ht 73.0 in | Wt 194.0 lb

## 2011-04-01 DIAGNOSIS — I10 Essential (primary) hypertension: Secondary | ICD-10-CM

## 2011-04-01 MED ORDER — LISINOPRIL-HYDROCHLOROTHIAZIDE 10-12.5 MG PO TABS: 1.0000 | ORAL_TABLET | Freq: Every day | ORAL | Status: DC

## 2011-04-01 NOTE — Assessment & Plan Note (Signed)
He has had no chest pain. He had only minimal plaque on his catheterization. No change in therapy is indicated.

## 2011-04-01 NOTE — Assessment & Plan Note (Signed)
He has had long-standing asymptomatic complete heart block with pacemaker. No change in therapy is indicated.

## 2011-04-01 NOTE — Progress Notes (Signed)
   HPI The patient presents for followup after recent hospitalization for non-Q wave myocardial infarction. He actually had a catheterization demonstrating no obstructive coronary disease.  Since discharge he has done well. He denies any cardiovascular symptoms. The patient denies any new symptoms such as chest discomfort, neck or arm discomfort. There has been no new shortness of breath, PND or orthopnea. There have been no reported palpitations, presyncope or syncope.  He has been active as is his usual.  No Known Allergies  Current Outpatient Prescriptions  Medication Sig Dispense Refill  . aspirin 81 MG chewable tablet Chew 324 mg by mouth as needed. Chest pain      . beta carotene 40981 UNIT capsule Take 25,000 Units by mouth daily.      . Biotin 10 MG TABS Take 1 tablet by mouth daily.      . cholecalciferol (VITAMIN D) 1000 UNITS tablet Take 1,000 Units by mouth daily.      Marland Kitchen co-enzyme Q-10 30 MG capsule Take 30 mg by mouth daily.      . fish oil-omega-3 fatty acids 1000 MG capsule Take 1 g by mouth daily.      Marland Kitchen lisinopril-hydrochlorothiazide (PRINZIDE,ZESTORETIC) 10-12.5 MG per tablet Take 1 tablet by mouth daily.      . LUTEIN-ZEAXANTHIN PO Take by mouth daily.      . Magnesium Oxide (MAG-OX 400 PO) Take by mouth daily.      . Misc Natural Products (GLUCOSAMINE CHOND COMPLEX/MSM PO) Take by mouth daily.      Marland Kitchen Resveratrol 250 MG CAPS Take by mouth daily.      Marland Kitchen Specialty Vitamins Products (PROSTATE PO) Take by mouth daily. 2 tab daily      . Turmeric 500 MG CAPS Take by mouth daily.      . vitamin B-12 (CYANOCOBALAMIN) 1000 MCG tablet Take 1,000 mcg by mouth daily.      . vitamin C (ASCORBIC ACID) 500 MG tablet Take 500 mg by mouth daily.        Past Medical History  Diagnosis Date  . Hypertension   . Complete heart block     Past Surgical History  Procedure Date  . Hemmorhoidectomy     ROS:  As stated in the HPI and negative for all other systems.  PHYSICAL EXAM BP  180/80  Pulse 40  Ht 6\' 1"  (1.854 m)  Wt 194 lb (87.998 kg)  BMI 25.60 kg/m2 GENERAL:  Well appearing HEENT:  Pupils equal round and reactive, fundi not visualized, oral mucosa unremarkable NECK:  No jugular venous distention, waveform within normal limits, carotid upstroke brisk and symmetric, no bruits, no thyromegaly LYMPHATICS:  No cervical, inguinal adenopathy LUNGS:  Clear to auscultation bilaterally BACK:  No CVA tenderness CHEST:  Unremarkable HEART:  PMI not displaced or sustained,S1 and S2 within normal limits, no S3, no S4, no clicks, no rubs, no murmurs ABD:  Flat, positive bowel sounds normal in frequency in pitch, no bruits, no rebound, no guarding, no midline pulsatile mass, no hepatomegaly, no splenomegaly EXT:  2 plus pulses throughout, no edema, no cyanosis no clubbing SKIN:  No rashes no nodules NEURO:  Cranial nerves II through XII grossly intact, motor grossly intact throughout PSYCH:  Cognitively intact, oriented to person place and time  ASSESSMENT AND PLAN

## 2011-04-01 NOTE — Patient Instructions (Signed)
The current medical regimen is effective;  continue present plan and medications.  You will be scheduled for a 24 hour blood pressure monitor.  You will be called with an appointment.  Please follow up with Dr Antoine Poche after wearing this monitor.

## 2011-04-01 NOTE — Assessment & Plan Note (Signed)
His blood pressure is elevated today. He says it's always under 130 systolic at home. I think his blood pressure was the cause for his non-Q-wave myocardial infarction. I'm going to have him come back for a 24-hour blood pressure monitor.  Further adjustments to his medications will be based on this.

## 2011-05-04 ENCOUNTER — Encounter (INDEPENDENT_AMBULATORY_CARE_PROVIDER_SITE_OTHER): Payer: Medicare Other

## 2011-05-04 DIAGNOSIS — R030 Elevated blood-pressure reading, without diagnosis of hypertension: Secondary | ICD-10-CM

## 2011-05-04 DIAGNOSIS — I10 Essential (primary) hypertension: Secondary | ICD-10-CM

## 2011-05-11 ENCOUNTER — Encounter: Payer: Self-pay | Admitting: Cardiology

## 2011-05-11 ENCOUNTER — Ambulatory Visit (INDEPENDENT_AMBULATORY_CARE_PROVIDER_SITE_OTHER): Payer: Medicare Other | Admitting: Cardiology

## 2011-05-11 VITALS — BP 180/70 | HR 38 | Ht 73.0 in | Wt 195.0 lb

## 2011-05-11 DIAGNOSIS — I442 Atrioventricular block, complete: Secondary | ICD-10-CM

## 2011-05-11 MED ORDER — LISINOPRIL 10 MG PO TABS: 10.0000 mg | ORAL_TABLET | Freq: Every day | ORAL | Status: DC

## 2011-05-11 NOTE — Progress Notes (Signed)
HPI The patient presents for followup after recent hospitalization for non-Q wave myocardial infarction. He actually had a catheterization demonstrating no obstructive coronary disease.  He also has chronic CHB but has not required a pacemaker.  After the last visit he had a 24-hour blood pressure monitor. This demonstrated that the stent the predominance of that day with systolics above 140s peaking in the 190s. In the early part of his sleeping hours he also was hypertensive.  The patient denies any new symptoms such as chest discomfort, neck or arm discomfort. There has been no new shortness of breath, PND or orthopnea. There have been no reported palpitations, presyncope or syncope.  In particular he denies any chest discomfort that he had at the time of his non-Q-wave microinfarction.  No Known Allergies  Current Outpatient Prescriptions  Medication Sig Dispense Refill  . aspirin 81 MG chewable tablet Chew 324 mg by mouth as needed. Chest pain      . beta carotene 40981 UNIT capsule Take 25,000 Units by mouth daily.      . Biotin 10 MG TABS Take 1 tablet by mouth daily.      . cholecalciferol (VITAMIN D) 1000 UNITS tablet Take 1,000 Units by mouth daily.      Marland Kitchen co-enzyme Q-10 30 MG capsule Take 30 mg by mouth daily.      . fish oil-omega-3 fatty acids 1000 MG capsule Take 1 g by mouth daily.      Marland Kitchen lisinopril-hydrochlorothiazide (PRINZIDE,ZESTORETIC) 10-12.5 MG per tablet Take 1 tablet by mouth daily.  30 tablet  11  . LUTEIN-ZEAXANTHIN PO Take by mouth daily.      . Magnesium Oxide (MAG-OX 400 PO) Take by mouth daily.      . Misc Natural Products (GLUCOSAMINE CHOND COMPLEX/MSM PO) Take by mouth daily.      Marland Kitchen Resveratrol 250 MG CAPS Take by mouth daily.      Marland Kitchen Specialty Vitamins Products (PROSTATE PO) Take by mouth daily. 2 tab daily      . Turmeric 500 MG CAPS Take by mouth daily.      . vitamin B-12 (CYANOCOBALAMIN) 1000 MCG tablet Take 1,000 mcg by mouth daily.      . vitamin C  (ASCORBIC ACID) 500 MG tablet Take 500 mg by mouth daily.        Past Medical History  Diagnosis Date  . Hypertension   . Complete heart block     Past Surgical History  Procedure Date  . Hemmorhoidectomy     ROS:  As stated in the HPI and negative for all other systems.  PHYSICAL EXAM BP 180/70  Pulse 38  Ht 6\' 1"  (1.854 m)  Wt 195 lb (88.451 kg)  BMI 25.73 kg/m2 GENERAL:  Well appearing NECK:  No jugular venous distention, waveform within normal limits, carotid upstroke brisk and symmetric, no bruits, no thyromegaly LYMPHATICS:  No cervical, inguinal adenopathy LUNGS:  Clear to auscultation bilaterally BACK:  No CVA tenderness CHEST:  Unremarkable HEART:  PMI not displaced or sustained,S1 and S2 within normal limits, no S3, no S4, no clicks, no rubs, no murmurs ABD:  Flat, positive bowel sounds normal in frequency in pitch, no bruits, no rebound, no guarding, no midline pulsatile mass, no hepatomegaly, no splenomegaly EXT:  2 plus pulses throughout, no edema, no cyanosis no clubbing NEURO:  Cranial nerves II through XII grossly intact, motor grossly intact throughout PSYCH:  Cognitively intact, oriented to person place and time  ASSESSMENT AND  PLAN

## 2011-05-11 NOTE — Assessment & Plan Note (Signed)
Today I will increase his lisinopril to 20 mg and continue other meds as listed. He will continue with his own blood pressure diary.

## 2011-05-11 NOTE — Patient Instructions (Signed)
Take an additional Lisinopril 10mg  daily.  Your physician recommends that you schedule a follow-up appointment in: 4 months with Dr. Antoine Poche.

## 2011-05-11 NOTE — Assessment & Plan Note (Signed)
He has had no further chest pain.  No further work the tongue is needed.

## 2011-05-11 NOTE — Assessment & Plan Note (Signed)
This has been chronic and stable and no pacemaker is planned.

## 2011-07-23 ENCOUNTER — Telehealth: Payer: Self-pay | Admitting: Family Medicine

## 2011-07-23 ENCOUNTER — Ambulatory Visit (INDEPENDENT_AMBULATORY_CARE_PROVIDER_SITE_OTHER): Payer: Medicare Other | Admitting: Family Medicine

## 2011-07-23 ENCOUNTER — Ambulatory Visit (HOSPITAL_COMMUNITY)
Admission: RE | Admit: 2011-07-23 | Discharge: 2011-07-23 | Disposition: A | Discharge status: Home / Self Care | Payer: Medicare Other | Source: Ambulatory Visit | Attending: Family Medicine | Admitting: Family Medicine

## 2011-07-23 VITALS — BP 210/100 | HR 34 | Temp 98.0°F | Resp 20 | Ht 72.0 in | Wt 189.0 lb

## 2011-07-23 DIAGNOSIS — M79609 Pain in unspecified limb: Secondary | ICD-10-CM | POA: Insufficient documentation

## 2011-07-23 DIAGNOSIS — M7989 Other specified soft tissue disorders: Secondary | ICD-10-CM

## 2011-07-23 DIAGNOSIS — M79669 Pain in unspecified lower leg: Secondary | ICD-10-CM

## 2011-07-23 DIAGNOSIS — I82819 Embolism and thrombosis of superficial veins of unspecified lower extremities: Secondary | ICD-10-CM | POA: Insufficient documentation

## 2011-07-23 DIAGNOSIS — M79661 Pain in right lower leg: Secondary | ICD-10-CM

## 2011-07-23 NOTE — Telephone Encounter (Signed)
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  Ryan notified, and patient advised results and that he should follow up with Dr. Milus Glazier in a few days-- also that after MD reviewed, if he had further recommendation we would contact patient.  Note to MD.

## 2011-07-23 NOTE — Patient Instructions (Signed)
Deep Vein Thrombosis A deep vein thrombosis (DVT) is a blood clot (thrombus) that develops in a deep vein. A DVT is a clot in the deep, larger veins of the leg, arm, or pelvis. These are more dangerous than clots that might form in veins on the surface of the body. Deep vein thrombosis can lead to complications if the clot breaks off and travels in the bloodstream to the lungs. CAUSES Blood clots form in a vein for different reasons. Usually several things cause blood clots. They include:  The flow of blood slows down.   The inside of the vein is damaged in some way.   The person has a condition that makes blood clot more easily. These conditions may include:   Older age (especially over 75 years old).   Having a history of blood clots.   Having major or lengthy surgery. Hip surgery is particularly high-risk.   Breaking a hip or leg.   Sitting or lying still for a long time.   Cancer or cancer treatment.   Having a long, thin tube (catheter) placed inside a vein during a medical procedure.   Being overweight (obese).   Pregnancy and childbirth.   Medicines with estrogen.   Smoking.   Other circulation or heart problems.  SYMPTOMS When a clot forms, it can either partially or totally block the blood flow in that vein. Symptoms of a DVT can include:  Swelling of the leg or arm, especially if one side is much worse.   Warmth and redness of the leg or arm, especially if one side is much worse.   Pain in an arm or leg. If the clot is in the leg, symptoms may be more noticeable or worse when standing or walking.  If the blood clot travels to the lung, it may cause:  Shortness of breath.   Chest pain. The pain may be worsened by deep breaths.   Coughing up thick mucus (phlegm), possibly flecked with blood.  Anyone with these symptoms should get emergency medical treatment right away. Call your local emergency services (911 in [place] if you have these symptoms. DIAGNOSIS If  a DVT is suspected, your caregiver will take a full medical history. He or she will also perform a physical exam. Tests that also may be required include:  Studies of the clotting properties of the blood.   An ultrasound scan.   X-rays to show the flow of blood when special dye is injected into the veins (venography).   Studies of your lungs if you have any chest symptoms.  PREVENTION  Exercise the legs regularly. Take a brisk 30 minute walk every day.   Maintain a weight that is appropriate for your height.   Avoid sitting or lying in bed for long periods of time without moving your legs.   Women, particularly those over the age of 35, should consider the risks and benefits of taking estrogen medicines, including birth control pills.   Do not smoke, especially if you take estrogen medicines.   Long-distance travel can increase your risk. You should exercise your legs by walking or pumping the muscles every hour.   In hospital prevention:   Prevention may include medical and nonmedical measures.  TREATMENT  The most common treatment for DVT is blood thinning (anticoagulant) medicine, which reduces the blood's tendency to clot. Anticoagulants can stop new blood clots from forming and old ones from growing. They cannot dissolve existing clots. Your body does this by itself over time.   Anticoagulants can be given by mouth, by intravenous (IV) access, or by injection. Your caregiver will determine the best program for you.   Less commonly, clot-dissolving drugs (thrombolytics) are used to dissolve a DVT. They carry a high risk of bleeding, so they are used mainly in severe cases.   Very rarely, a blood clot in the leg needs to be removed surgically.   If you are unable to take anticoagulants, your caregiver may arrange for you to have a filter placed in a main vein in your belly (abdomen). This filter prevents clots from traveling to your lungs.  HOME CARE INSTRUCTIONS  Take all  medicines prescribed by your caregiver. Follow the directions carefully.   You will most likely continue taking anticoagulants after you leave the hospital. Your caregiver will advise you on the length of treatment (usually 3 to 6 months, sometimes for life).   Taking too much or too little of an anticoagulant is dangerous. While taking this type of medicine, you will need to have regular blood tests to be sure the dose is correct. The dose can change for many reasons. It is critically important that you take this medicine exactly as prescribed, and that you have blood tests exactly as directed.   Many foods can interfere with anticoagulants. These include foods high in vitamin K, such as spinach, kale, broccoli, cabbage, collard and turnip greens, Brussels sprouts, peas, cauliflower, seaweed, parsley, beef and pork liver, green tea, and soybean oil. Your caregiver should discuss limits on these foods with you or you should arrange a visit with a dietician to answer your questions.   Many medicines can interfere with anticoagulants. You must tell your caregiver about any and all medicines you take. This includes all vitamins and supplements. Be especially cautious with aspirin and anti-inflammatory medicines. Ask your caregiver before taking these.   Anticoagulants can have side effects, mostly excessive bruising or bleeding. You will need to hold pressure over cuts for longer than usual. Avoid alcoholic drinks or consume only very small amounts while taking this medicine.   If you are taking an anticoagulant:   Wear a medical alert bracelet.   Notify your dentist or other caregivers before procedures.   Avoid contact sports.   Ask your caregiver how soon you can go back to normal activities. Not being active can lead to new clots. Ask for a list of what you should and should not do.   Exercise your lower leg muscles. This is important while traveling.   You may need to wear compression  stockings. These are tight elastic stockings that apply pressure to the lower legs. This can help keep the blood in the legs from clotting.   If you are a smoker, you should quit.   Learn as much as you can about DVT.  SEEK MEDICAL CARE IF:  You have unusual bruising or any bleeding problems.   The swelling or pain in your affected arm or leg is not gradually improving.   You anticipate surgery or long-distance travel. You should get specific advice on DVT prevention.   You discover other family members with blood clots. This may require further testing for inherited diseases or conditions.  SEEK IMMEDIATE MEDICAL CARE IF:  You develop chest pain.   You develop severe shortness of breath.   You begin to cough up bloody mucus or phlegm (sputum).   You feel dizzy or faint.   You develop swelling or pain in the leg.   You have   breathing problems after traveling.  MAKE SURE YOU:  Understand these instructions.   Will watch your condition.   Will get help right away if you are not doing well or get worse.  Document Released: 02/08/2005 Document Revised: 01/28/2011 Document Reviewed: 04/02/2010 [employer] Patient Information 2012 [employer]. 

## 2011-07-23 NOTE — Progress Notes (Signed)
78 yo man with spontaneous right mid calf pain today x several hours.  No trauma, swelling or ecchymosis  Wife had phlebitis.  O:  No acute distress,  Right calf exam is completely normal. No ecchymosis, no tenderness, no swelling, no redness.  Assessment: Remote possibility of phlebitis, new onset  Plan: Check venous Doppler today, if negative continue baby aspirin daily

## 2011-09-10 ENCOUNTER — Ambulatory Visit (INDEPENDENT_AMBULATORY_CARE_PROVIDER_SITE_OTHER): Payer: Medicare Other | Admitting: Emergency Medicine

## 2011-09-10 VITALS — BP 108/62 | HR 49 | Temp 97.7°F | Resp 16 | Ht 70.5 in | Wt 183.0 lb

## 2011-09-10 DIAGNOSIS — R300 Dysuria: Secondary | ICD-10-CM

## 2011-09-10 DIAGNOSIS — N3090 Cystitis, unspecified without hematuria: Secondary | ICD-10-CM

## 2011-09-10 DIAGNOSIS — N3000 Acute cystitis without hematuria: Secondary | ICD-10-CM

## 2011-09-10 LAB — POCT UA - MICROSCOPIC ONLY
Casts, Ur, LPF, POC: NEGATIVE
Crystals, Ur, HPF, POC: NEGATIVE
Epithelial cells, urine per micros: NEGATIVE
Mucus, UA: NEGATIVE
Yeast, UA: NEGATIVE

## 2011-09-10 LAB — POCT URINALYSIS DIPSTICK
Glucose, UA: NEGATIVE
Ketones, UA: 15
Nitrite, UA: POSITIVE
Protein, UA: 100
Spec Grav, UA: 1.02
Urobilinogen, UA: 1
pH, UA: 5.5

## 2011-09-10 MED ORDER — SULFAMETHOXAZOLE-TRIMETHOPRIM 800-160 MG PO TABS: 1.0000 | ORAL_TABLET | Freq: Two times a day (BID) | ORAL | Status: AC

## 2011-09-10 NOTE — Patient Instructions (Signed)
Heat Disorders  Heat related disorders are illnesses caused by continued exposure to hot and humid environments, not drinking enough fluids, and/or your body failing to regulate its temperature correctly. People suffer from heat stress and heat related disorders when their bodies are unable to compensate and cool down through sweating. With sufficient heat, sweating is not enough to keep you cool, and your body temperature can rise quickly. Very high body temperatures can damage your brain and other vital organs. High humidity (moisture in the air), adds to heat stress, because it is harder for sweat to evaporate and cool your body. Heat stress and disorders are not uncommon. Some medicines can increase your risk for heat related illness. Ask your caregiver about your medicines during periods of intense heat.   Heat related disorders include:   Heatstroke. When you cannot sweat or regulate your body temperature in an adequate way. This is very dangerous and can be life threatening. Get emergency medical help.   Heat exhaustion. Overheating causes heavy sweating and a fast heart rate. Your body can still regulate its own temperature.   Heat cramps. Painful, uncontrollable muscle spasms. Can occur during heavy exercise in hot environments.   Sunburn. Skin becomes red and painful (burned) after being out in the sun.   Heat rash. Sweat ducts become blocked, which traps sweat under the skin. This causes blisters and red bumps and may cause an itchy or tingling feeling.  PREVENTING HEAT STRESS AND HEAT RELATED DISORDERS  Overheating can be dangerous and life threatening. When exercising, working, or doing other activities in hot and humid environments, do the following:   Stay informed by listening to and watching local broadcast weather and safety updates during intense heat.   Air conditioning is the best way to prevent heat disorders. If your home is not air conditioned, spend time in air conditioned places  (malls, public libraries, or heat shelters set up by your local health department).   Wear light-weight, light colored, loose fitting clothing. Wear as little clothing as possible when at home.   Increase your fluid intake. Drink enough water and fluids to keep your urine clear or pale yellow. DO NOT WAIT UNTIL YOU ARE THIRSTY TO DRINK. You may already be heat stressed, and not recognize it.   If your caregiver has suggested that you limit the amount of fluid you drink or has prescribed water pills for a medical problem, ask how much you should drink when the weather is hot.   Do not drink liquids with alcohol, caffeine, or lots of sugar. They can cause more loss of body fluid.   Heavy sweating drains your body's salt and minerals, which must be replaced. If you must exercise in the heat, a sports beverage can replace the salt and minerals you lose in sweat. If you are on a low-salt diet, check with your caregiver before drinking a sports beverage.   Sunburn reduces your body's ability to cool itself and causes a loss of needed body fluids. If you go outdoors, protect yourself from the sun by wearing a wide-brimmed hat, along with sunglasses.   Put on sunscreen of SPF 15 or higher, 30 minutes before going out. (The most effective products say "broad spectrum" or "UVA/UVB protection" on the label.) Reapply sunscreen frequently -- at least every 1-2 hours.   Take added precautions when both the heat and humidity are high.   Rest often.   Even young and otherwise healthy people can become heat stressed and suffer   from a heat disorder, if they participate in strenuous activities during hot weather.   If you must be outdoors, try going out only during morning and evening hours, when it is cooler. Rest often in shady areas, so that your body's temperature can adjust.   If your heart pounds or you are gasping for breath, STOP all activity. Go immediately to a cool area, or at least into the shade, and rest.  This is especially true if you become lightheaded, confused, weak, or faint.   Electric fans may make you comfortable, but they DO NOT prevent heat related problems.  SYMPTOMS    Headache.   Nosebleed.   Weakness.   You feel very hot.   Muscle cramps.   Restlessness.   Fainting or dizziness.   Fast breathing and shortness of breath.   Excessive sweating. (There may be little or no sweating in late stages of heat exhaustion.)   Rapid pulse, heart pounding.   Feeling sick to your stomach (nauseous, vomiting).   Skin becoming cold and clammy, or excessively hot and dry.  HOME CARE INSTRUCTIONS    Lie down and rest in a cool or air conditioned area.   Drink enough water and fluids to keep your urine clear or pale yellow. Avoid fluids with caffeine or high sugar content. Avoid coffee, tea, alcohol or stimulants.   Do not take salt tablets, unless advised by your caregiver.   Avoid hot foods and heavy meals.   Bathe or shower in cool water.   Wear minimal clothing.   Use a fan. Add cool or warm mist to the air, if possible.   If possible, decrease the use of your stove or oven at home.   Monitor adults at risk at least twice a day, watching closely for signs of heat exhaustion or heat stroke. Infants and young children also require more frequent watching.   Never leave infants, children or pets in a parked car, even if the windows are cracked open.   If you are 65 years of age or older, have a friend or relative call to check on you twice a day during a heat wave. If you know someone in this age group, check on them at least twice a day.  SEEK IMMEDIATE MEDICAL CARE IF:   You have a hard time breathing.   You vomit or pass blood in your stool.   You have a seizure, feel dizzy or faint, or pass out.   You develop severe sweating.   Your skin is red, hot and dry (there is no sweating).   Your urine turns a dark color or has blood in it.   You are making very little or no urine.   You are  unable to keep fluids down.   You develop chest or abdominal pain.   You develop a throbbing headache.   You develop nausea or confusion.  IF YOU OBSERVE SOMEONE WHO MIGHT HAVE HEAT STROKE  This can be life threatening. Call your local emergency services (911 in the [place]   If the victim is in the sun, get him or her to a shady area.   Cool the victim rapidly, using whatever methods you have:   Place the victim in a tub of cool water or a cool shower.   Spray the victim with cool water from a garden hose, or sponge the person with cool water.   Wrap the victim in a cool, wet sheet and fan them.     If emergency medical help is delayed, call the hospital emergency room or your local emergency services (911 in the [place] for further instructions.   Sometimes a victim's muscles will begin to twitch from heat stroke. If this happens, keep the victim from injuring himself. However, do not place any object in the mouth. Give fluids, unless the muscle twitching makes it difficult or unsafe to do so. If there is vomiting, make sure the airway remains open by turning the victim on his or her side.  Document Released: 02/06/2000 Document Revised: 01/28/2011 Document Reviewed: 11/25/2008  [employer] Patient Information 2012 [employer].

## 2011-09-10 NOTE — Progress Notes (Signed)
  Subjective:    Patient ID: Chad Russell, male    DOB: 1926-10-08, 78 y.o.   MRN: [id number]  HPI Comments: Three year history of recurrent vertigo.  Never evaluated.  Says had episode yesterday following a 'hike' with his wife.  Apparently took no fluids outside despite the heat and spent nearly 24 hours at bedrest. Now resolved.  Denies other neuro complaints related to this episode of vertigo.  Dysuria  This is a new problem. The current episode started today. The problem occurs every urination. The problem has been unchanged. The pain is mild. There has been no fever. He is not sexually active. There is no history of pyelonephritis. Associated symptoms include chills, frequency and urgency. Pertinent negatives include no discharge, flank pain, hematuria, hesitancy, nausea, possible pregnancy, sweats or vomiting. He has tried nothing for the symptoms. The treatment provided no relief. There is no history of catheterization, kidney stones, recurrent UTIs, a single kidney, urinary stasis or a urological procedure.  Muscle Pain Associated symptoms include dysuria. Pertinent negatives include no fatigue, fever, headaches, nausea, vomiting or weakness.      Review of Systems  Constitutional: Positive for chills. Negative for fever, diaphoresis, activity change, appetite change, fatigue and unexpected weight change.  HENT: Negative.   Eyes: Negative.   Respiratory: Negative.   Cardiovascular: Negative.   Gastrointestinal: Negative.  Negative for nausea and vomiting.  Genitourinary: Positive for dysuria, urgency and frequency. Negative for hesitancy, hematuria and flank pain.  Musculoskeletal: Negative.   Skin: Negative.   Neurological: Positive for dizziness. Negative for tremors, seizures, syncope, facial asymmetry, speech difficulty, weakness, light-headedness, numbness and headaches.       Objective:   Physical Exam  Constitutional: He is oriented to person, place, and time. He  appears well-developed and well-nourished.  HENT:  Head: Normocephalic and atraumatic.  Right Ear: External ear normal.  Left Ear: External ear normal.  Eyes: Conjunctivae are normal. Pupils are equal, round, and reactive to light.  Neck: Normal range of motion. Neck supple.  Cardiovascular: Normal rate and regular rhythm.   Pulmonary/Chest: Effort normal.  Abdominal: Soft.  Musculoskeletal: Normal range of motion.  Neurological: He is alert and oriented to person, place, and time. He has normal reflexes. No cranial nerve deficit. He exhibits normal muscle tone. Coordination normal.  Skin: Skin is dry.          Assessment & Plan:   Cystitis Septra Follow up as needed

## 2011-11-15 ENCOUNTER — Ambulatory Visit (INDEPENDENT_AMBULATORY_CARE_PROVIDER_SITE_OTHER): Payer: Medicare Other

## 2011-11-15 DIAGNOSIS — Z23 Encounter for immunization: Secondary | ICD-10-CM

## 2012-01-12 ENCOUNTER — Encounter (HOSPITAL_COMMUNITY): Payer: Self-pay | Admitting: Emergency Medicine

## 2012-01-12 ENCOUNTER — Ambulatory Visit (INDEPENDENT_AMBULATORY_CARE_PROVIDER_SITE_OTHER): Payer: Medicare Other | Admitting: Family Medicine

## 2012-01-12 ENCOUNTER — Emergency Department (HOSPITAL_COMMUNITY)
Admission: EM | Admit: 2012-01-12 | Discharge: 2012-01-12 | Disposition: A | Discharge status: Home / Self Care | Payer: Medicare Other | Attending: Emergency Medicine | Admitting: Emergency Medicine

## 2012-01-12 ENCOUNTER — Ambulatory Visit: Payer: Medicare Other

## 2012-01-12 ENCOUNTER — Emergency Department (HOSPITAL_COMMUNITY): Payer: Medicare Other

## 2012-01-12 ENCOUNTER — Encounter: Payer: Self-pay | Admitting: Emergency Medicine

## 2012-01-12 VITALS — BP 115/66 | HR 54 | Temp 97.3°F | Resp 16

## 2012-01-12 DIAGNOSIS — I10 Essential (primary) hypertension: Secondary | ICD-10-CM | POA: Insufficient documentation

## 2012-01-12 DIAGNOSIS — S322XXA Fracture of coccyx, initial encounter for closed fracture: Secondary | ICD-10-CM | POA: Insufficient documentation

## 2012-01-12 DIAGNOSIS — M25559 Pain in unspecified hip: Secondary | ICD-10-CM

## 2012-01-12 DIAGNOSIS — I252 Old myocardial infarction: Secondary | ICD-10-CM | POA: Insufficient documentation

## 2012-01-12 DIAGNOSIS — Z87891 Personal history of nicotine dependence: Secondary | ICD-10-CM | POA: Insufficient documentation

## 2012-01-12 DIAGNOSIS — I442 Atrioventricular block, complete: Secondary | ICD-10-CM | POA: Insufficient documentation

## 2012-01-12 DIAGNOSIS — M25552 Pain in left hip: Secondary | ICD-10-CM

## 2012-01-12 DIAGNOSIS — I951 Orthostatic hypotension: Secondary | ICD-10-CM

## 2012-01-12 DIAGNOSIS — S7000XA Contusion of unspecified hip, initial encounter: Secondary | ICD-10-CM

## 2012-01-12 DIAGNOSIS — S7002XA Contusion of left hip, initial encounter: Secondary | ICD-10-CM

## 2012-01-12 DIAGNOSIS — Y939 Activity, unspecified: Secondary | ICD-10-CM | POA: Insufficient documentation

## 2012-01-12 DIAGNOSIS — N501 Vascular disorders of male genital organs: Secondary | ICD-10-CM

## 2012-01-12 DIAGNOSIS — S3210XA Unspecified fracture of sacrum, initial encounter for closed fracture: Secondary | ICD-10-CM | POA: Insufficient documentation

## 2012-01-12 DIAGNOSIS — Y9289 Other specified places as the place of occurrence of the external cause: Secondary | ICD-10-CM | POA: Insufficient documentation

## 2012-01-12 DIAGNOSIS — W138XXA Fall from, out of or through other building or structure, initial encounter: Secondary | ICD-10-CM | POA: Insufficient documentation

## 2012-01-12 DIAGNOSIS — Z79899 Other long term (current) drug therapy: Secondary | ICD-10-CM | POA: Insufficient documentation

## 2012-01-12 LAB — POCT CBC
Granulocyte percent: 84.8 %G — AB (ref 37–80)
HCT, POC: 42.6 % — AB (ref 43.5–53.7)
Hemoglobin: 12.9 g/dL — AB (ref 14.1–18.1)
Lymph, poc: 2.3 (ref 0.6–3.4)
MCH, POC: 31.8 pg — AB (ref 27–31.2)
MCHC: 30.3 g/dL — AB (ref 31.8–35.4)
MCV: 104.9 fL — AB (ref 80–97)
MID (cbc): 0.6 (ref 0–0.9)
MPV: 9.4 fL (ref 0–99.8)
POC Granulocyte: 16.6 — AB (ref 2–6.9)
POC LYMPH PERCENT: 11.9 %L (ref 10–50)
POC MID %: 3.3 %M (ref 0–12)
Platelet Count, POC: 225 10*3/uL (ref 142–424)
RBC: 4.06 M/uL — AB (ref 4.69–6.13)
RDW, POC: 14 %
WBC: 19.6 10*3/uL — AB (ref 4.6–10.2)

## 2012-01-12 LAB — BASIC METABOLIC PANEL
CO2: 25 mEq/L (ref 19–32)
Calcium: 8.9 mg/dL (ref 8.4–10.5)
GFR calc Af Amer: 74 mL/min — ABNORMAL LOW (ref >90)
GFR calc non Af Amer: 64 mL/min — ABNORMAL LOW (ref >90)
Sodium: 139 mEq/L (ref 135–145)

## 2012-01-12 LAB — CBC WITH DIFFERENTIAL/PLATELET
Basophils Absolute: 0 10*3/uL (ref 0.0–0.1)
Eosinophils Absolute: 0 10*3/uL (ref 0.0–0.7)
Eosinophils Relative: 0 % (ref 0–5)
Lymphocytes Relative: 4 % — ABNORMAL LOW (ref 12–46)
MCV: 98.9 fL (ref 78.0–100.0)
Platelets: 187 10*3/uL (ref 150–400)
RDW: 13.6 % (ref 11.5–15.5)
WBC: 15.4 10*3/uL — ABNORMAL HIGH (ref 4.0–10.5)

## 2012-01-12 MED ORDER — HYDROCODONE-ACETAMINOPHEN 5-500 MG PO TABS: 1.0000 | ORAL_TABLET | Freq: Four times a day (QID) | ORAL | Status: DC | PRN

## 2012-01-12 NOTE — ED Provider Notes (Signed)
History     CSN: [id number]  Arrival date & time 01/12/12  4098   First MD Initiated Contact with Patient 01/12/12 2021      Chief Complaint  Patient presents with  . Hip Pain     HPI Patient fell from roof and landed on buttock.  Hx of 3rd degree heart block.  Patient states that he had no syncope.  This was mechanical fall.  Has buttock hematoma.  Only complaint is buttock pain.   Past Medical History  Diagnosis Date  . Hypertension   . Complete heart block   . NSTEMI (non-ST elevated myocardial infarction)     Past Surgical History  Procedure Date  . Hemmorhoidectomy     History reviewed. No pertinent family history.  History  Substance Use Topics  . Smoking status: Former Smoker -- 1.0 packs/day for 3 years    Types: Cigarettes    Quit date: 03/17/1955  . Smokeless tobacco: Not on file  . Alcohol Use: 1.2 oz/week    2 Glasses of wine per week     Comment: daily      Review of Systems  All other systems reviewed and are negative.    Allergies  Review of patient's allergies indicates no known allergies.  Home Medications   Current Outpatient Rx  Name  Route  Sig  Dispense  Refill  . ASPIRIN 81 MG PO CHEW   Oral   Chew 324 mg by mouth as needed. Chest pain         . BETA CAROTENE 11914 UNITS PO CAPS   Oral   Take 25,000 Units by mouth daily.         Marland Kitchen BIOTIN 10 MG PO TABS   Oral   Take 1 tablet by mouth daily.         Marland Kitchen VITAMIN D 1000 UNITS PO TABS   Oral   Take 4,000 Units by mouth daily.          Marland Kitchen COENZYME Q10 30 MG PO CAPS   Oral   Take 30 mg by mouth daily.         Marland Kitchen LISINOPRIL 5 MG PO TABS   Oral   Take 5 mg by mouth daily.         Marland Kitchen LISINOPRIL-HYDROCHLOROTHIAZIDE 10-12.5 MG PO TABS   Oral   Take 0.5 tablets by mouth daily.         . LUTEIN-ZEAXANTHIN PO   Oral   Take by mouth daily.         Marland Kitchen MAG-OX 400 PO   Oral   Take by mouth daily.         Marland Kitchen PROBIOTIC DAILY PO   Oral   Take 1 tablet by mouth  daily.         Marland Kitchen RESVERATROL 250 MG PO CAPS   Oral   Take by mouth daily.         Marland Kitchen PROSTATE PO   Oral   Take by mouth daily. 2 tab daily         . TURMERIC 500 MG PO CAPS   Oral   Take by mouth daily.         Marland Kitchen VITAMIN B-12 1000 MCG PO TABS   Oral   Take 1,000 mcg by mouth daily.         Marland Kitchen VITAMIN C 500 MG PO TABS   Oral   Take 500 mg by mouth daily.         Marland Kitchen  HYDROCODONE-ACETAMINOPHEN 5-500 MG PO TABS   Oral   Take 1-2 tablets by mouth every 6 (six) hours as needed for pain.   20 tablet   0     BP 93/53  Pulse 41  Resp 18  SpO2 98%  Physical Exam  Nursing note and vitals reviewed. Constitutional: He is oriented to person, place, and time. He appears well-developed and well-nourished. No distress.  HENT:  Head: Normocephalic and atraumatic.  Eyes: Pupils are equal, round, and reactive to light.  Neck: Normal range of motion.  Cardiovascular: Normal rate and intact distal pulses.   Pulmonary/Chest: No respiratory distress.  Abdominal: Normal appearance. He exhibits no distension.  Genitourinary:     Musculoskeletal: Normal range of motion.  Neurological: He is alert and oriented to person, place, and time. No cranial nerve deficit.  Skin: Skin is warm and dry. No rash noted.  Psychiatric: He has a normal mood and affect. His behavior is normal.    ED Course  Procedures (including critical care time)  Labs Reviewed  CBC WITH DIFFERENTIAL - Abnormal; Notable for the following:    WBC 15.4 (*)     RBC 3.49 (*)     Hemoglobin 11.8 (*)     HCT 34.5 (*)     Neutrophils Relative 86 (*)     Neutro Abs 13.3 (*)     Lymphocytes Relative 4 (*)     Lymphs Abs 0.6 (*)     Monocytes Absolute 1.6 (*)     All other components within normal limits  BASIC METABOLIC PANEL - Abnormal; Notable for the following:    Glucose, Bld 121 (*)     GFR calc non Af Amer 64 (*)     GFR calc Af Amer 74 (*)     All other components within normal limits  LAB REPORT  - SCANNED   Dg Hip Complete Left  01/12/2012  *RADIOLOGY REPORT*  Clinical Data: Left hip pain after fall.  LEFT HIP - COMPLETE 2+ VIEW  Comparison: None.  Findings: Study is limited by incomplete visualization of the proximal left femur and left pubic rami.  On the two provided images, there is no evidence for a femoral neck fracture, but the lesser trochanter has not been included on either film.  Bones are diffusely demineralized.  IMPRESSION: No evidence for fracture on the provided images although assessment left hip is limited by field of view.  If symptoms persist or worsen, repeat imaging of the left hip is recommended.   Original Report Authenticated By: Kennith Center, M.D.    Ct Pelvis Wo Contrast  01/12/2012  *RADIOLOGY REPORT*  Clinical Data: Fall, left hip pain.  CT PELVIS WITHOUT CONTRAST  Technique:  Multidetector CT imaging of the pelvis was performed following the standard protocol without intravenous contrast.  Comparison: None.  Findings: The visualized intrapelvic contents show circumferential bladder wall thickening, nonspecific given incomplete distension. The prostate gland is enlarged and lobular in contour.  There is scattered atherosclerotic calcification of the aorta and its branches. No aneurysmal dilatation.  There is mild presacral edema.  There is a small hematoma collecting anterior to the left sacroiliac joint.  In addition, there is subcutaneous fat stranding and hematoma posterior to the left sacroiliac joint, along the gluteal musculature.  Diffuse osteopenia and multilevel degenerative changes.  Left SI joint ankylosis.  There is a nondisplaced fracture of the inferior left sacrum.  No appreciable extension into the SI joint.  The femoral acetabular joints are  intact.  No displaced femur or acetabular fracture is identified. The pubic rami are intact.  Left supra-acetabular bone island.  IMPRESSION: Nondisplaced inferior left sacral fracture with associated hematoma  anterior and posterior to the left SI joint.  The left hip appears intact. MRI has increased sensitivity for nondisplaced fracture if clinical concern persists.  Enlarged prostate gland.  Recommend PSA and physical exam correlation.   Original Report Authenticated By: Jearld Lesch, M.D.    EKG:  Complete heart block ,but no change since last tracing other than rate has improved.  1. Sacral fracture   2. Pelvic hematoma, male       MDM  Case discussed with Dr, Rayburn Ma or ortho.  States ok to ambulate.  Patient ambulating with little difficulty in hall.   After treatment in the ED the patient feels back to baseline and insists on going home.        Nelia Shi, MD 01/13/12 2255

## 2012-01-12 NOTE — ED Notes (Addendum)
Received from [facility]. Fell fell 4-5 feet while on roof and landed on his left hip. Has walked on it and bear weight. According to Pt. And wife he had an episode at the [facility] where he was standing and became faint and nauseated. Given 900cc of NS there.

## 2012-01-12 NOTE — ED Notes (Signed)
Patient transported to CT 

## 2012-01-12 NOTE — Progress Notes (Signed)
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 He says that he was on a ladder and he felt his lungs from the ladder and landed on his left buttock. There was no loss of consciousness or head injury. He continued to work but when he went inside to see his wife he could color with swelling and increasing pain in the left buttock.  In addition patient is feeling little lightheaded when he stands up at the present time. His wife has applied ice to the left buttock. Patient was driven to the facility by his wife who accompanies him at the bedside.  Objective: Large area of induration and ecchymosis the left cheek of the left buttock. Patient is able to bear weight and walk but he looks ashen when he stands.  I checked patient's blood pressure standing and sitting: 100/60 sitting and 60/46 standing  Heart: Regular at about 45 beats per minute, no murmur Abdomen: Soft nontender without HSM or swelling Extremities: Other than the large ecchymosis, patient seems to have full range of motion of his arms and legs. Head and neck: Nontender including nontender spine, alert, oriented, and cooperative. He OM intact, pupils equal reactive to light and accommodation. There is no evidence of head trauma. [facility] reading (PRIMARY) by  Dr. Milus Glazier: no fracture, some soft tissue swelling  6:25 pm  BP 120/80 sitting, 80/60 standing (after 300 cc NS infused):  Patient still lightheaded when standing  Assessment:  This is an elderly gentleman with trauma this evening and even though he seems or tolerated the fall for the first hour afterwards, he is now hypotensive was standing and has a huge hematoma suggesting a significant bleed. Furthermore am not clear whether he has some pelvic bleeding as well. For these reasons I am sending the patient by EMS to the emergency department at [facility] hospital for further evaluation and observation.

## 2012-02-23 DIAGNOSIS — N400 Enlarged prostate without lower urinary tract symptoms: Secondary | ICD-10-CM

## 2012-02-23 HISTORY — DX: Benign prostatic hyperplasia without lower urinary tract symptoms: N40.0

## 2012-04-26 ENCOUNTER — Encounter (HOSPITAL_COMMUNITY): Payer: Self-pay | Admitting: Sports Medicine

## 2012-04-26 ENCOUNTER — Inpatient Hospital Stay (HOSPITAL_COMMUNITY)
Admission: AD | Admit: 2012-04-26 | Discharge: 2012-04-29 | DRG: 690 | Disposition: A | Discharge status: Home / Self Care | Payer: Medicare Other | Source: Ambulatory Visit | Attending: Family Medicine | Admitting: Family Medicine

## 2012-04-26 ENCOUNTER — Encounter: Payer: Self-pay | Admitting: Family Medicine

## 2012-04-26 ENCOUNTER — Inpatient Hospital Stay (HOSPITAL_COMMUNITY): Payer: Medicare Other

## 2012-04-26 ENCOUNTER — Ambulatory Visit (INDEPENDENT_AMBULATORY_CARE_PROVIDER_SITE_OTHER): Payer: Medicare Other | Admitting: Family Medicine

## 2012-04-26 VITALS — BP 118/73 | HR 45 | Temp 97.5°F | Resp 18 | Ht 71.0 in | Wt 177.0 lb

## 2012-04-26 DIAGNOSIS — N390 Urinary tract infection, site not specified: Principal | ICD-10-CM

## 2012-04-26 DIAGNOSIS — I2510 Atherosclerotic heart disease of native coronary artery without angina pectoris: Secondary | ICD-10-CM | POA: Diagnosis present

## 2012-04-26 DIAGNOSIS — I442 Atrioventricular block, complete: Secondary | ICD-10-CM

## 2012-04-26 DIAGNOSIS — N179 Acute kidney failure, unspecified: Secondary | ICD-10-CM | POA: Diagnosis present

## 2012-04-26 DIAGNOSIS — R350 Frequency of micturition: Secondary | ICD-10-CM

## 2012-04-26 DIAGNOSIS — J111 Influenza due to unidentified influenza virus with other respiratory manifestations: Secondary | ICD-10-CM

## 2012-04-26 DIAGNOSIS — I252 Old myocardial infarction: Secondary | ICD-10-CM

## 2012-04-26 DIAGNOSIS — D72829 Elevated white blood cell count, unspecified: Secondary | ICD-10-CM

## 2012-04-26 DIAGNOSIS — M79609 Pain in unspecified limb: Secondary | ICD-10-CM | POA: Diagnosis present

## 2012-04-26 DIAGNOSIS — R300 Dysuria: Secondary | ICD-10-CM

## 2012-04-26 DIAGNOSIS — I10 Essential (primary) hypertension: Secondary | ICD-10-CM | POA: Diagnosis present

## 2012-04-26 DIAGNOSIS — R5381 Other malaise: Secondary | ICD-10-CM

## 2012-04-26 DIAGNOSIS — R5383 Other fatigue: Secondary | ICD-10-CM | POA: Diagnosis present

## 2012-04-26 DIAGNOSIS — M79669 Pain in unspecified lower leg: Secondary | ICD-10-CM

## 2012-04-26 DIAGNOSIS — I509 Heart failure, unspecified: Secondary | ICD-10-CM | POA: Diagnosis present

## 2012-04-26 DIAGNOSIS — E871 Hypo-osmolality and hyponatremia: Secondary | ICD-10-CM | POA: Diagnosis present

## 2012-04-26 DIAGNOSIS — N401 Enlarged prostate with lower urinary tract symptoms: Secondary | ICD-10-CM

## 2012-04-26 DIAGNOSIS — N400 Enlarged prostate without lower urinary tract symptoms: Secondary | ICD-10-CM | POA: Diagnosis present

## 2012-04-26 DIAGNOSIS — F17200 Nicotine dependence, unspecified, uncomplicated: Secondary | ICD-10-CM | POA: Diagnosis present

## 2012-04-26 DIAGNOSIS — R6889 Other general symptoms and signs: Secondary | ICD-10-CM

## 2012-04-26 DIAGNOSIS — Z7982 Long term (current) use of aspirin: Secondary | ICD-10-CM

## 2012-04-26 DIAGNOSIS — B9689 Other specified bacterial agents as the cause of diseases classified elsewhere: Secondary | ICD-10-CM | POA: Diagnosis present

## 2012-04-26 DIAGNOSIS — E440 Moderate protein-calorie malnutrition: Secondary | ICD-10-CM

## 2012-04-26 DIAGNOSIS — Z79899 Other long term (current) drug therapy: Secondary | ICD-10-CM

## 2012-04-26 DIAGNOSIS — J441 Chronic obstructive pulmonary disease with (acute) exacerbation: Secondary | ICD-10-CM | POA: Diagnosis present

## 2012-04-26 DIAGNOSIS — R7881 Bacteremia: Secondary | ICD-10-CM

## 2012-04-26 HISTORY — DX: Unspecified fracture of unspecified talus, initial encounter for closed fracture: S92.109A

## 2012-04-26 LAB — POCT URINALYSIS DIPSTICK
Glucose, UA: NEGATIVE
Nitrite, UA: NEGATIVE
Spec Grav, UA: >=1.03

## 2012-04-26 LAB — POCT UA - MICROSCOPIC ONLY: Crystals, Ur, HPF, POC: NEGATIVE

## 2012-04-26 LAB — POCT CBC
Hemoglobin: 15.9 g/dL (ref 14.1–18.1)
MCH, POC: 32.1 pg — AB (ref 27–31.2)
MCV: 98.9 fL — AB (ref 80–97)
RBC: 4.95 M/uL (ref 4.69–6.13)
WBC: 15.4 10*3/uL — AB (ref 4.6–10.2)

## 2012-04-26 LAB — CBC
HCT: 36.7 % — ABNORMAL LOW (ref 39.0–52.0)
MCH: 31.9 pg (ref 26.0–34.0)
MCV: 90.2 fL (ref 78.0–100.0)
RDW: 13.7 % (ref 11.5–15.5)
WBC: 12.2 10*3/uL — ABNORMAL HIGH (ref 4.0–10.5)

## 2012-04-26 LAB — COMPREHENSIVE METABOLIC PANEL
BUN: 40 mg/dL — ABNORMAL HIGH (ref 6–23)
CO2: 20 mEq/L (ref 19–32)
Calcium: 8.8 mg/dL (ref 8.4–10.5)
Chloride: 97 mEq/L (ref 96–112)
Creatinine, Ser: 1.57 mg/dL — ABNORMAL HIGH (ref 0.50–1.35)
GFR calc non Af Amer: 39 mL/min — ABNORMAL LOW (ref >90)
Total Bilirubin: 0.4 mg/dL (ref 0.3–1.2)

## 2012-04-26 LAB — GLUCOSE, POCT (MANUAL RESULT ENTRY): POC Glucose: 129 mg/dl — AB (ref 70–99)

## 2012-04-26 MED ORDER — HEPARIN SODIUM (PORCINE) 5000 UNIT/ML IJ SOLN
5000.0000 [IU] | Freq: Three times a day (TID) | INTRAMUSCULAR | Status: DC
  Administered 2012-04-26 – 2012-04-29 (×8): 5000 [IU] via SUBCUTANEOUS
  Filled 2012-04-26 (×11): qty 1

## 2012-04-26 MED ORDER — SODIUM CHLORIDE 0.9 % IJ SOLN
3.0000 mL | Freq: Two times a day (BID) | INTRAMUSCULAR | Status: DC
  Administered 2012-04-28 (×2): 3 mL via INTRAVENOUS

## 2012-04-26 MED ORDER — SODIUM CHLORIDE 0.9 % IV BOLUS (SEPSIS)
500.0000 mL | Freq: Once | INTRAVENOUS | Status: AC
  Administered 2012-04-27: 500 mL via INTRAVENOUS

## 2012-04-26 MED ORDER — SODIUM CHLORIDE 0.9 % IV SOLN
INTRAVENOUS | Status: DC
  Administered 2012-04-26: 23:00:00 via INTRAVENOUS

## 2012-04-26 MED ORDER — ACETAMINOPHEN 650 MG RE SUPP: 650.0000 mg | Freq: Four times a day (QID) | RECTAL | Status: DC | PRN

## 2012-04-26 MED ORDER — PIPERACILLIN-TAZOBACTAM 3.375 G IVPB
3.3750 g | Freq: Three times a day (TID) | INTRAVENOUS | Status: DC
  Administered 2012-04-26 – 2012-04-28 (×5): 3.375 g via INTRAVENOUS
  Filled 2012-04-26 (×7): qty 50

## 2012-04-26 MED ORDER — ASPIRIN 81 MG PO TABS
81.0000 mg | ORAL_TABLET | Freq: Every day | ORAL | Status: DC
  Filled 2012-04-26 (×2): qty 1

## 2012-04-26 MED ORDER — ASPIRIN EC 81 MG PO TBEC
81.0000 mg | DELAYED_RELEASE_TABLET | Freq: Every day | ORAL | Status: DC
  Administered 2012-04-27 – 2012-04-29 (×3): 81 mg via ORAL
  Filled 2012-04-26 (×3): qty 1

## 2012-04-26 MED ORDER — ACETAMINOPHEN 325 MG PO TABS: 650.0000 mg | ORAL_TABLET | Freq: Four times a day (QID) | ORAL | Status: DC | PRN

## 2012-04-26 NOTE — Patient Instructions (Addendum)
Go to [facility] for admission by the [facility] Service.  You will need to go to the Emergency room ONLY to be registered for admission, as you should have a bed already scheduled with the [facility] Teaching Service.

## 2012-04-26 NOTE — Progress Notes (Signed)
Subjective:    Patient ID: Chad Russell, male    DOB: [birth year], 79 y.o.   MRN: [id number]  HPI Chad Russell is a 79 y.o. male Hx of HTN, NSTEMI 03/15/11, - non-Q wave myocardial infarction. catheterization demonstrating no obstructive coronary disease.  chronic CHB but has not required a pacemaker. Followed by Dr. Antoine Poche with OV in 04/2011.   10 days ago, and about a week ago- around friends with respiratory infection of some type.  He started with shivers 1 week ago, urinary urgency and frequency over this past week - persistent. Lost a few pounds with this over past week - less appetite. Shaking chills this morning. Cold chills, but no fever.  Rare cough, some sinus congestion.  No abd pain. Malodorous urine, darker yellow. No hematuria.  Tx: none except 3 alleve. otc alka seltzer once only.   No prior eval for these current symptoms.    Review of Systems  Constitutional: Positive for chills.  Respiratory: Positive for cough. Negative for chest tightness and shortness of breath.   Cardiovascular: Negative for chest pain and palpitations.  Genitourinary: Positive for dysuria and frequency.  Neurological: Positive for dizziness and light-headedness (past week. ). Negative for syncope.       Objective:   Physical Exam  Vitals reviewed. Constitutional: He is oriented to person, place, and time. He appears well-developed. No distress.  Appears fatigued, slightly tremulous at times during exam, but conversant, nontoxic.    HENT:  Head: Normocephalic and atraumatic.  Dry lips, somewhat tacky oral mucosa.   Cardiovascular: Normal heart sounds and normal pulses.   No extrasystoles are present. Bradycardia present.   Pulses:      Radial pulses are 2+ on the right side, and 2+ on the left side.  Pulmonary/Chest: Effort normal and breath sounds normal. No respiratory distress. He has no wheezes. He has no rales.  Abdominal: Soft. He exhibits no distension. There is no  tenderness. There is no rebound and no guarding.  Neurological: He is alert and oriented to person, place, and time.  Skin: Skin is warm and dry.  Slight tenting of dorsal forearm skin.   Psychiatric: He has a normal mood and affect. His behavior is normal.   Results for orders placed in visit on 04/26/12  GLUCOSE, POCT (MANUAL RESULT ENTRY)      Result Value Range   POC Glucose 129 (*) 70 - 99 mg/dl  POCT CBC      Result Value Range   WBC 15.4 (*) 4.6 - 10.2 K/uL   Lymph, poc 2.3  0.6 - 3.4   POC LYMPH PERCENT 15.0  10 - 50 %L   MID (cbc) 1.1 (*) 0 - 0.9   POC MID % 7.0  0 - 12 %M   POC Granulocyte 12.0 (*) 2 - 6.9   Granulocyte percent 78.0  37 - 80 %G   RBC 4.95  4.69 - 6.13 M/uL   Hemoglobin 15.9  14.1 - 18.1 g/dL   HCT, POC 40.9  81.1 - 53.7 %   MCV 98.9 (*) 80 - 97 fL   MCH, POC 32.1 (*) 27 - 31.2 pg   MCHC 32.4  31.8 - 35.4 g/dL   RDW, POC 91.4     Platelet Count, POC 274  142 - 424 K/uL   MPV 9.1  0 - 99.8 fL  POCT URINALYSIS DIPSTICK      Result Value Range   Color, UA orange  Clarity, UA cloudy     Glucose, UA neg     Bilirubin, UA small     Ketones, UA trace     Spec Grav, UA >=1.030     Blood, UA mod     pH, UA 5.5     Protein, UA 100     Urobilinogen, UA 0.2     Nitrite, UA neg     Leukocytes, UA small (1+)    POCT UA - MICROSCOPIC ONLY      Result Value Range   WBC, Ur, HPF, POC tntc     RBC, urine, microscopic tntc     Bacteria, U Microscopic 5+     Mucus, UA pos     Epithelial cells, urine per micros 0-3     Crystals, Ur, HPF, POC neg     Casts, Ur, LPF, POC neg     Yeast, UA neg         Assessment & Plan:  Chad Russell is a 79 y.o. male Rigors - Plan: POCT CBC  Urinary frequency - Plan: POCT glucose (manual entry), POCT urinalysis dipstick, POCT UA - Microscopic Only  Dysuria - Plan: POCT urinalysis dipstick, POCT UA - Microscopic Only  Other malaise and fatigue - Plan: POCT CBC, Basic metabolic panel  UTI (urinary tract  infection)  Leukocytosis, unspecified  Suspected UTI with concern of bacteremia with rigors, and possible volume depletion.  Hx of chronic, asymptomatic complete heart block followed by Dr. Antoine Poche.  Denies any chest pain/pressure.   Admit to [facility] FPTS for eval, possible blood and urine cultures, IVF, antibiotics.  Discussed with Dr. Paulina Fusi of FPTS, plan of telemetry bed.  1850: pt to go to ED for admitting only.  Has tele bed arranged through FPTS.  Sent by private vehicle.    Patient Instructions  Go to [facility] for admission by the [facility] Service.  You will need to go to the Emergency room ONLY to be registered for admission, as you should have a bed already scheduled with the [facility] Teaching Service.

## 2012-04-26 NOTE — H&P (Signed)
Family Medicine Teaching Service Admission H&P Service Pager: [phone]  Patient name: Chad Russell Medical record number: [id number] Date of birth: 10-May-1926 Age: 79 y.o. Gender: male  Primary Care Provider: Shade Flood, MD Attending Physician: Sanjuana Letters, MD Consultants: CARDIOLOGY - known 3rd degree heart block  CODE STATUS: FULL CODE  CC  Fatigue, Malaise and Rigors  HPI  Chad Russell is a 79 y.o. year old male who presented to [facility] with 1 week of malaise, fatigue, and reported daily rigors.  He had been in his normal state of health and been extremely active with plans for a skiing trip later this week prior to becoming ill.  He denies and nausea or vomiting, no diarrhea, no bloating.  No significant cough but some upper airway congestion.  Significant urinary urgency and frequency with malodorous, dark yellow urine.  No hematuria.  No history of instrumentation, kidney stones, solitary kidney.  Hx of UTI treated in 08/2011 with Septra however no culture obtained and pt reports no significant in improvement following treatment.  Hx of Prostate Biopsy without evidence of CA.  Not currently taking OTC meds but did try aleve and alka seltzer without relief  At [facility] found to have leukocytosis, + UA and given significant systemic symptoms concerns for occult bacteremia were raised.  FMTS was asked for direct admission.  Of significance pt does have a history of 3rd Degree Heart Block that has been asymptomatic, narrow escape beat and chronotropically competent not requiring a pacer at this time.  Hx of Q-wave MI in 02/2011 (no obstructing coronary lesions on CATH  ROS   Constitutional Malaise, significant fatigue with ~16hrs of sleep q night X last 4 nights, generalized weakness  Infectious Rigors but no objective fever  Resp No cough, mild congestion  Cardiac No chest pain, no dyspnea, no orthostasis  GI No N/V.  No  constipation, no diarrhea, no melana, no hematachezia  GU Frequency, urgency as above  Skin No new rashes noted  Psych Had been under a lot of stress with Q-Wave MI  Neuro No weakness, no falls  MSK Generalized weakness and fatigue  Trauma Larey Seat off ladder in Nov, 2013 with sacral fx - no falls since  Activity Highly active until acute illness, active ski instructor  Diet Decreased po intake since acute illness, otherwise healthy   MEDS Has been holding ACEi during acute illness     HISTORY  PMHx:  Past Medical History  Diagnosis Date  . Hypertension   . Complete heart block   . NSTEMI (non-ST elevated myocardial infarction)   . Talar fracture     casted no surg    PSHx: Past Surgical History  Procedure Laterality Date  . Hemmorhoidectomy    . Colonoscopy      Repeated and normal in 2011  . Prostate biopsy      negative - [facility]    Social Hx: History   Social History  . Marital Status: Married    Spouse Name: N/A    Number of Children: 0  . Years of Education: N/A   Occupational History  . ski Secondary school teacher   . civil Art gallery manager    Social History Main Topics  . Smoking status: Former Smoker -- 1.00 packs/day for 3 years    Types: Cigarettes    Quit date: 03/17/1955  . Smokeless tobacco: None  . Alcohol Use: 1.2 oz/week    2 Glasses of wine per week  Comment: daily  . Drug Use: No  . Sexually Active: No   Other Topics Concern  . 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 No pertinent family history.  Allergies: No Known Allergies  Home Medications: Prescriptions prior to admission  Medication Sig Dispense Refill  . aspirin 81 MG tablet Take 81 mg by mouth daily.      . beta carotene 16109 UNIT capsule Take 25,000 Units by mouth daily.      . Biotin 10 MG TABS Take 1 tablet by  mouth daily.      . cholecalciferol (VITAMIN D) 1000 UNITS tablet Take 4,000 Units by mouth daily.       Marland Kitchen co-enzyme Q-10 30 MG capsule Take 30 mg by mouth daily.      Marland Kitchen lisinopril-hydrochlorothiazide (PRINZIDE,ZESTORETIC) 10-12.5 MG per tablet Take 0.5 tablets by mouth daily.      . LUTEIN-ZEAXANTHIN PO Take by mouth daily.      . Magnesium Oxide (MAG-OX 400 PO) Take by mouth daily.      . Probiotic Product (PROBIOTIC DAILY PO) Take 1 tablet by mouth daily.      Marland Kitchen Resveratrol 250 MG CAPS Take by mouth daily.      Marland Kitchen Specialty Vitamins Products (PROSTATE PO) Take by mouth daily. 2 tab daily      . Turmeric 500 MG CAPS Take by mouth daily.      . vitamin B-12 (CYANOCOBALAMIN) 1000 MCG tablet Take 1,000 mcg by mouth daily.      . vitamin C (ASCORBIC ACID) 500 MG tablet Take 500 mg by mouth daily.      . [DISCONTINUED] aspirin 81 MG chewable tablet Chew 324 mg by mouth as needed. Chest pain      . [DISCONTINUED] HYDROcodone-acetaminophen (VICODIN) 5-500 MG per tablet Take 1-2 tablets by mouth every 6 (six) hours as needed for pain.  20 tablet  0  . [DISCONTINUED] lisinopril (PRINIVIL,ZESTRIL) 5 MG tablet Take 5 mg by mouth daily.        OBJECTIVE  Vitals: Temp:  [97.5 F (36.4 C)-98.2 F (36.8 C)] 98.2 F (36.8 C) (03/05 2100) Pulse Rate:  [45-52] 52 (03/05 2100) Resp:  [18] 18 (03/05 2100) BP: (118-119)/(69-73) 119/69 mmHg (03/05 2100) Weight:  [176 lb 9.4 oz (80.1 kg)-177 lb (80.287 kg)] 176 lb 9.4 oz (80.1 kg) (03/05 2100)  Weight: Wt Readings from Last 3 Encounters:  04/26/12 176 lb 9.4 oz (80.1 kg)  04/26/12 177 lb (80.287 kg)  09/10/11 183 lb (83.008 kg)    I&Os: Yesterday:   This shift: Total I/O In: 3 [I.V.:3] Out: -    PE: GENERAL:  Elderly well built and athletic  male. In no discomfort; no respiratory distress. PSYCH: Alert and appropriately interactive; Insight: great H&N: AT/[place], trachea midline EENT:  MMM, no scleral icterus, EOMi HEART: Bradycardic, S1/S2  heard, no murmur LUNGS: crackles in RLL ABDOMEN: +BS, soft, non-tender BACK: no CVA tenderness EXTREMITIES: Moves all 4 extremities spontaneously, warm well perfused, no edema, bilateral DP and PT pulses 2/4.   SKIN: lesion on central region of back that is an erythematous papule concerning for superficial BCC    LABS: CBC BMET   Recent Labs Lab 04/26/12 1746 04/26/12 2223  WBC 15.4* 12.2*  HGB 15.9 13.0  HCT 49.0 36.7*  PLT  --  212   No results found for this basename: NA, K, CL, CO2, BUN, CREATININE, GLUCOSE, CALCIUM,  in the last 168 hours   URINE STUDIES: Results for JARELL, MCEWEN (MRN [id number]) as of 04/26/2012 23:08  04/26/2012 17:46  Color, UA orange  Specific Gravity, UA >=1.030  pH, UA 5.5  Glucose neg  Bilirubin, UA small  Ketones, UA trace  Clarity, UA cloudy  Protein, UA 100  Urobilinogen, UA 0.2  Nitrite, UA neg  Leukocytes, UA small (1+)  RBC, UA mod  Mucus, UA pos  Yeast, UA neg  Epithelial cells, urine per micros 0-3  Casts, Ur, LPF, POC neg  Crystals, Ur, HPF, POC neg  WBC, Ur, HPF, POC tntc  Bacteria, U Microscopic 5+  RBC, urine, microscopic tntc   MICRO: None Obtained  ORDERED: 3/5 Blood Cultures X 2 >> 3/5 Urine Gram Stain >>  IMAGING: No results found.  Medications:   acetaminophen, acetaminophen  Assessment & Plan  LOS: [age] y.o. year old highly active male with Leukocytosis rigors, malaise, fatigue X 1 week with likely UTI vs PNA concern for occult bacteremia.  Hx of complete heart block without pacer and currently asymptomatic.    # ID (Leukocytosis, Unclear UTI, crackles in RLL):  - Pt with suspected prior UTI in July 2013.  Will repeat UA here and obtain gram stain, urine culture.  Obtain Blood cultures given rigors and significant Leukocytosis concern for upper urinary track disease.  May need to consider obstruction, noted prostate enlargement from CT scan of pelvis in Nov 2013.   - Crackles on Exam - obtain 2V CXR as  this may represent PNA as well  Zosyn for Enterococcus coverage for UTI given recurrent UTI and for sufficient coverage of CAP.  Blood Cultures X 2  Urine Gram Stain  # CV (3rd Degree Heart Block, Hx of Q-wave MI, HTN): Obtain repeat EKG.  Given unusualness of case, I discussed with Dr. Tresa Endo ([facility] on Call).  Given pt is asymptomatic, narrow escape and chronotropically competent okay with current plan.  Pt is remarkable asymptomatic and is still participating in [employer] classes and working full time.    Although he has been stable given extent of fatigue may need to consider this a new change; hopefully his fatigue is only related to his infectious etiology.    TELE for changes in block and monitoring of new malignant arrhythmias  No indication for further cardiac risk evaluation given prior evaluation and lack of cardiac symptoms  # SKIN: likely BCC on upper thoracic region midline.  Will need further evaluated as OP. # Prostate Enlargement: seen on CT scan of pelvis 12/2011 -  Pt has previously been followed at [facility] and underwent prostate biopsy that was negative for malignancy however has failed to follow up.  May consider starting Flomax if culture proven UTI to help any type of outflow tract disease.  Consider referral back to [facility] as OP to re-establish.    PSA; if low reassuring, if indeterminate or high will need f/u at [facility] either way     --- FEN  *NS @ 122ml/hr -Regular Diet --- PPx: Heparin    Disposition  Pt to be admitted to [facility] for IV ABX and IV Fluids.  Cultures obtained and will need to be monitored for 24+ hours prior to d/c.  Monitor for s/sx Sepsis.  Cardiology to evaluate in AM given complexity/unusualness of case; anticipate no change in cardiology  plans.     Andrena Mews, DO [facility] Resident - PGY-2 04/26/2012 11:08 PM

## 2012-04-26 NOTE — Consult Note (Signed)
ANTIBIOTIC CONSULT NOTE - INITIAL  Pharmacy Consult for Zosyn Indication: UTI and ? PNA  No Known Allergies  Patient Measurements: Height: 5\' 11"  (180.3 cm) Weight: 176 lb 9.4 oz (80.1 kg) IBW/kg (Calculated) : 75.3  Vital Signs: Temp: 98.2 F (36.8 C) (03/05 2100) Temp src: Oral (03/05 2100) BP: 119/69 mmHg (03/05 2100) Pulse Rate: 52 (03/05 2100) Intake/Output from previous day:   Intake/Output from this shift:    Labs:  Recent Labs  04/26/12 1746  WBC 15.4*  HGB 15.9   Estimated Creatinine Clearance: 56.8 ml/min (by C-G formula based on Cr of 1.05).  Microbiology: No results found for this or any previous visit (from the past 720 hour(s)).  Medical History: Past Medical History  Diagnosis Date  . Hypertension   . Complete heart block   . NSTEMI (non-ST elevated myocardial infarction)   . Talar fracture     casted no surg   Assessment: 85yom to begin zosyn for suspected UTI and possible pneumonia. BMET for this admission is pending but sCr from 12/2011 was 1.05 with estimated CrCl 5ml/min.  Goal of Therapy:  Appropriate zosyn dosing  Plan:  1) Zosyn 3.375g IV q8 (4 hour infusion) 2) Follow up BMET and adjust dose if needed  Fredrik Rigger 04/26/2012,9:39 PM

## 2012-04-26 NOTE — Progress Notes (Signed)
Nurse spoke with Dr. Berline Chough concerning patient's 3rd Heart Block and HR in the 30's and 40's. Dr. Berline Chough informed the nurse that the patient is asymptomatic and that he would inform central monitoring to decrease the alarm to 30. Nurse contacts central monitoring and spoke with monitor tech Clydie Braun. Clydie Braun informed the nurse that the Dr had indeed called to notify them of the Hrt Block and HR, and instructed them to lower the alarm to 30. Dr. Berline Chough instructed the nurse to call him if the patient experiences any unusual arrhythmias. The nurse complied with Drs instructions. Chad Russell

## 2012-04-27 DIAGNOSIS — J111 Influenza due to unidentified influenza virus with other respiratory manifestations: Secondary | ICD-10-CM

## 2012-04-27 LAB — CBC
Hemoglobin: 13.9 g/dL (ref 13.0–17.0)
MCH: 31.7 pg (ref 26.0–34.0)
MCV: 91.3 fL (ref 78.0–100.0)
RBC: 4.38 MIL/uL (ref 4.22–5.81)

## 2012-04-27 LAB — BASIC METABOLIC PANEL
BUN: 36 mg/dL — ABNORMAL HIGH (ref 6–23)
CO2: 24 mEq/L (ref 19–32)
CO2: 25 mEq/L (ref 19–32)
Chloride: 99 mEq/L (ref 96–112)
GFR calc non Af Amer: 41 mL/min — ABNORMAL LOW (ref >90)
Glucose, Bld: 120 mg/dL — ABNORMAL HIGH (ref 70–99)
Glucose, Bld: 100 mg/dL — ABNORMAL HIGH (ref 70–99)
Potassium: 4.5 mEq/L (ref 3.5–5.3)
Potassium: 4.5 mEq/L (ref 3.5–5.1)
Sodium: 135 mEq/L (ref 135–145)

## 2012-04-27 LAB — URINALYSIS, ROUTINE W REFLEX MICROSCOPIC
Glucose, UA: NEGATIVE mg/dL
Specific Gravity, Urine: 1.02 (ref 1.005–1.030)
pH: 5 (ref 5.0–8.0)

## 2012-04-27 LAB — GRAM STAIN

## 2012-04-27 LAB — URINE MICROSCOPIC-ADD ON

## 2012-04-27 MED ORDER — DOXYCYCLINE HYCLATE 100 MG PO TABS
50.0000 mg | ORAL_TABLET | Freq: Two times a day (BID) | ORAL | Status: DC
  Filled 2012-04-27 (×2): qty 0.5

## 2012-04-27 MED ORDER — DOXYCYCLINE HYCLATE 100 MG PO TABS
100.0000 mg | ORAL_TABLET | Freq: Two times a day (BID) | ORAL | Status: DC
  Administered 2012-04-27 (×2): 100 mg via ORAL
  Filled 2012-04-27 (×4): qty 1

## 2012-04-27 MED ORDER — DOXYCYCLINE HYCLATE 50 MG PO CAPS
50.0000 mg | ORAL_CAPSULE | Freq: Two times a day (BID) | ORAL | Status: DC
  Filled 2012-04-27 (×2): qty 1

## 2012-04-27 MED ORDER — TAMSULOSIN HCL 0.4 MG PO CAPS
0.4000 mg | ORAL_CAPSULE | Freq: Every day | ORAL | Status: DC
  Administered 2012-04-27 – 2012-04-29 (×3): 0.4 mg via ORAL
  Filled 2012-04-27 (×3): qty 1

## 2012-04-27 NOTE — Clinical Documentation Improvement (Signed)
MALNUTRITION DOCUMENTATION CLARIFICATION  THIS DOCUMENT IS NOT A PERMANENT PART OF THE MEDICAL RECORD  TO RESPOND TO THE THIS QUERY, FOLLOW THE INSTRUCTIONS BELOW:  1. If needed, update documentation for the patient's encounter via the notes activity.  2. Access this query again and click edit on the In [employer].  3. After updating, or not, click F2 to complete all highlighted (required) fields concerning your review. Select "additional documentation in the medical record" OR "no additional documentation provided".  4. Click Sign note button.  5. The deficiency will fall out of your In Basket *Please let us know if you are not able to complete this workflow by phone or e-mail (listed below).  Please update your documentation within the medical record to reflect your response to this query.                                                                                        04/27/12   Dear Dr.Hensel / Associates,  In a better effort to capture your patient's severity of illness, reflect appropriate length of stay and utilization of resources, a review of the patient medical record has revealed the following indicators.    Based on your clinical judgment, please clarify and document in a progress note and/or discharge summary the clinical condition associated with the following supporting information:  In responding to this query please exercise your independent judgment.  The fact that a query is asked, does not imply that any particular answer is desired or expected.   Possible Clinical Conditions?  _______Mild Malnutrition  _______Moderate Malnutrition _______Protein Calorie Malnutrition _______Other Condition _______Cannot clinically determine     Supporting Information: Risk Factors:  Signs & Symptoms: Ht: 5'11"     Wt: 176lb 9.4oz  BMI:  24.64   Nutrition Consult: 04/27/12: Patient meets criteria for Moderate Malnutrition in the context of acute illness, given  less than 75% intake of energy requirement for > 7 days. Patient reports decreased appetite 10 days prior to admission and a 7 pound weight loss during that time period.   You may use possible, probable, or suspect with inpatient documentation. possible, probable, suspected diagnoses MUST be documented at the time of discharge  Reviewed: additional documentation in the medical record  Thank You,  Marciano Sequin,  Clinical Documentation Specialist:  Pager: [phone]  Phone: [phone]  Health Information Management [place]

## 2012-04-27 NOTE — Progress Notes (Signed)
Pt c/o of right lower leg pain. Pt does not complain of tenderness. Area is not swollen or red. MD paged and made aware. MD to follow up with patient. Will continue to monitor. Dion Saucier

## 2012-04-27 NOTE — Progress Notes (Signed)
INITIAL NUTRITION ASSESSMENT  DOCUMENTATION CODES Per approved criteria  -Non-severe (moderate) malnutrition in the context of acute illness or injury   INTERVENTION:  No nutrition intervention at this time ---> patient declined RD to follow for nutrition care plan  NUTRITION DIAGNOSIS: Inadequate oral intake related to decreased appetite as evidenced by patient report   Goal: Oral intake with meals to meet >/= 90% of estimated nutrition needs  Monitor:  PO intake, weight, labs, I/O's  Reason for Assessment: Malnutrition Screening Tool Report  79 y.o. male  Admitting Dx: fatigue, malaise and rigors  ASSESSMENT: Patient presented to [facility] with 1 week of malaise, fatigue, and reported daily rigors; significant for urinary urgency and frequency; found to have leukocytosis, + UA and given significant systemic symptoms concerns for occult bacteremia patient admitted.  Patient reports his appetite PTA had been decreased for approximately 10 days; states he's also lost 7 lbs within this time frame; no % meal intake recorded, however, reports he ate breakfast fairly well this AM; declined addition of nutrition supplements.  Patient meets criteria for non-severe (moderate) malnutrition in the context of acute illness given < 75% intake of energy requirement for > 7 days and 4% weight loss in > 1 week.  Height: Ht Readings from Last 1 Encounters:  04/26/12 5\' 11"  (1.803 m)    Weight: Wt Readings from Last 1 Encounters:  04/26/12 176 lb 9.4 oz (80.1 kg)    Ideal Body Weight: 172 lb  % Ideal Body Weight: 102%  Wt Readings from Last 10 Encounters:  04/26/12 176 lb 9.4 oz (80.1 kg)  04/26/12 177 lb (80.287 kg)  09/10/11 183 lb (83.008 kg)  07/23/11 189 lb (85.73 kg)  05/11/11 195 lb (88.451 kg)  04/01/11 194 lb (87.998 kg)  03/16/11 187 lb 2.7 oz (84.9 kg)  03/16/11 187 lb 2.7 oz (84.9 kg)    Usual Body Weight: 183 lb  % Usual Body Weight:  96%  BMI:  Body mass index is 24.64 kg/(m^2).  Estimated Nutritional Needs: Kcal: 1800-2000 Protein: 90-100 gm Fluid: 1.8-2.0 L  Skin: Intact  Diet Order: Cardiac  EDUCATION NEEDS: -No education needs identified at this time   Intake/Output Summary (Last 24 hours) at 04/27/12 1138 Last data filed at 04/27/12 1110  Gross per 24 hour  Intake      3 ml  Output    825 ml  Net   -822 ml    Last BM: 3/5  Labs:   Recent Labs Lab 04/26/12 2223 04/27/12 0505  NA 128* 135  K 4.5 4.5  CL 97 99  CO2 20 25  BUN 40* 37*  CREATININE 1.57* 1.51*  CALCIUM 8.8 9.3  GLUCOSE 119* 100*    Scheduled Meds: . aspirin EC  81 mg Oral Daily  . doxycycline  100 mg Oral Q12H  . heparin  5,000 Units Subcutaneous Q8H  . piperacillin-tazobactam (ZOSYN)  IV  3.375 g Intravenous Q8H  . sodium chloride  3 mL Intravenous Q12H  . tamsulosin  0.4 mg Oral Daily    Continuous Infusions: . sodium chloride 100 mL/hr at 04/26/12 2234    Past Medical History  Diagnosis Date  . Hypertension   . Complete heart block   . NSTEMI (non-ST elevated myocardial infarction)   . Talar fracture     casted no surg    Past Surgical History  Procedure Laterality Date  . Hemmorhoidectomy    . Colonoscopy      Repeated  and normal in 2011  . Prostate biopsy      negative - [facility]    Maureen Chatters, [place], LDN Pager #: [phone] After-Hours Pager #: [phone]

## 2012-04-27 NOTE — Care Management Note (Unsigned)
    Page 1 of 1   04/27/2012     4:00:20 PM   CARE MANAGEMENT NOTE 04/27/2012  Patient:  Chad Russell, Chad Russell   Account Number:  [id number]  Date Initiated:  04/27/2012  Documentation initiated by:  Leann Mayweather  Subjective/Objective Assessment:   PT ADM WITH UTI, ? SEPSIS ON 04/26/12.  PTA, INDEPENDENT, LIVES WITH SPOUSE.     Action/Plan:   WILL FOLLOW FOR HOME NEEDS AS PT PROGRESSES.   Anticipated DC Date:  04/29/2012   Anticipated DC Plan:  HOME/SELF CARE      DC Planning Services  CM consult      Choice offered to / List presented to:             Status of service:  In process, will continue to follow Medicare Important Message given?   (If response is "NO", the following Medicare IM given date fields will be blank) Date Medicare IM given:   Date Additional Medicare IM given:    Discharge Disposition:    Per UR Regulation:  Reviewed for med. necessity/level of care/duration of stay  If discussed at Long Length of Stay Meetings, dates discussed:    Comments:

## 2012-04-27 NOTE — Progress Notes (Signed)
Utilization Review Completed.Dowell, Deborah T3/07/2012  

## 2012-04-27 NOTE — Progress Notes (Signed)
Seen and examined.  Agree with Dr. Adriana Simas.  See my co sign of the H&PE for my note of today.

## 2012-04-27 NOTE — Progress Notes (Signed)
Pt voided 300 ml in urinal. Measured post void residual per order and bladder scan revealed 360 ml of urine. Will continue to monitor intake and output. Chad Russell

## 2012-04-27 NOTE — Progress Notes (Signed)
Family Medicine Teaching Service Daily Progress Note Service Page: [phone]  Subjective:  Feeling much improved. Denies fevers, chills this am.  States appetite is improving.  Does report straining to urinate.  Objective: Temp:  [97.5 F (36.4 C)-98.2 F (36.8 C)] 98.2 F (36.8 C) (03/06 0345) Pulse Rate:  [40-52] 40 (03/06 0345) Resp:  [18] 18 (03/06 0345) BP: (118-154)/(62-73) 154/62 mmHg (03/06 0345) SpO2:  [97 %] 97 % (03/06 0345) Weight:  [176 lb 9.4 oz (80.1 kg)-177 lb (80.287 kg)] 176 lb 9.4 oz (80.1 kg) (03/05 2100)  Exam: General: well appearing, NAD. Cardiovascular: Bradycardic. Regular rhythm. No murmurs, rubs, or gallops. Respiratory: CTAB. No rales, rhonchi, or wheezing appreciated. Abdomen: soft, nontender, nondistended. Extremities: warm, well perfused. No edema  CBC BMET   Recent Labs Lab 04/26/12 1746 04/26/12 2223 04/27/12 0505  WBC 15.4* 12.2* 12.0*  HGB 15.9 13.0 13.9  HCT 49.0 36.7* 40.0  PLT  --  212 234    Recent Labs Lab 04/26/12 2223 04/27/12 0505  NA 128* 135  K 4.5 4.5  CL 97 99  CO2 20 25  BUN 40* 37*  CREATININE 1.57* 1.51*  GLUCOSE 119* 100*  CALCIUM 8.8 9.3     Urinalysis    Component Value Date/Time   COLORURINE AMBER* 04/27/2012 0019   APPEARANCEUR TURBID* 04/27/2012 0019   LABSPEC 1.020 04/27/2012 0019   PHURINE 5.0 04/27/2012 0019   GLUCOSEU NEGATIVE 04/27/2012 0019   HGBUR MODERATE* 04/27/2012 0019   BILIRUBINUR SMALL* 04/27/2012 0019   BILIRUBINUR small 04/26/2012 1746   KETONESUR 15* 04/27/2012 0019   PROTEINUR 100* 04/27/2012 0019   UROBILINOGEN 1.0 04/27/2012 0019   UROBILINOGEN 0.2 04/26/2012 1746   NITRITE NEGATIVE 04/27/2012 0019   NITRITE neg 04/26/2012 1746   LEUKOCYTESUR LARGE* 04/27/2012 0019   Imaging/Diagnostic Tests:  X-ray Chest Pa And Lateral  04/26/2012   IMPRESSION: Stable appearance to bilateral calcified pleural plaques.  There could be minimal superimposed right basilar opacity, which may reflect atelectasis or  possibly very mild pneumonia.   Assessment/Plan: 79 y.o. year old male with PMH of CAD, HTN, and Complete heart block presents with rigors, malaise, and fatigue x 1 week.  Patient also noted to have urinary urgency and frequency with malodorous urine.  Work up revealed mild leukocytosis of 15.4 and positive UA (large leukocytes, TNTC WBC's, many bacteria).  # UTI, possible prostatitis - Gram stain revealed gram - rods and gram + cocci in pairs.  Awaiting urine culture and blood cultures. - Will continue empiric Zosyn while awaiting culture results (patient has history of instrumentation and UTI's in the past).  Gram stain concerning for Enterococcus.  # Possible CAP - Chest xray revealed possible right basilar opacity which could represent PNA - Will continue empiric Zosyn. Doxycycline added this am.  # AKI - Creatinine elevated to 1.57 on admission - Will continue IV fluids and monitor with daily BMP  # Complete Heart Block - Long standing issue for patient.  No pacemaker placed. - Discussed with Cardiology. - Will continue to monitor closely.   # Hx of Prostate enlargement - Previously been followed by [facility].  FEN/GI: IV fluids - NS @ 100 mL/hr; Regular Diet PPx: Heparin Dispo: Pending clinical improvement Code Status: Full  Everlene Other, DO 04/27/2012, 8:05 AM

## 2012-04-27 NOTE — Clinical Documentation Improvement (Signed)
GENERIC DOCUMENTATION CLARIFICATION QUERY  THIS DOCUMENT IS NOT A PERMANENT PART OF THE MEDICAL RECORD  TO RESPOND TO THE THIS QUERY, FOLLOW THE INSTRUCTIONS BELOW:  1. If needed, update documentation for the patient's encounter via the notes activity.  2. Access this query again and click edit on the In [employer].  3. After updating, or not, click F2 to complete all highlighted (required) fields concerning your review. Select "additional documentation in the medical record" OR "no additional documentation provided".  4. Click Sign note button.  5. The deficiency will fall out of your In Basket *Please let us know if you are not able to complete this workflow by phone or e-mail (listed below).  Please update your documentation within the medical record to reflect your response to this query.                                                                                        04/27/12   Dear Dr. Leveda Anna / Associates,  In a better effort to capture your patient's severity of illness, reflect appropriate length of stay and utilization of resources, a review of the patient medical record has revealed the following indicators.    Based on your clinical judgment, please clarify and document in a progress note and/or discharge summary the clinical condition associated with the following supporting information:  In responding to this query please exercise your independent judgment.  The fact that a query is asked, does not imply that any particular answer is desired or expected.   Possible Clinical Conditions?  _______Hyponatremia  _______Other Condition  _______Cannot Clinically Determine     Supporting Information:  Diagnostics: 3/06: sodium: 128  Treatment: 3/05: 0.9% NaCl @ 153ml/hr.   You may use possible, probable, or suspect with inpatient documentation. possible, probable, suspected diagnoses MUST be documented at the time of discharge  Reviewed: additional  documentation in the medical record  Thank You,  Marciano Sequin,  Clinical Documentation Specialist:  Pager: [phone]  Phone: [phone]  Health Information Management [place]

## 2012-04-27 NOTE — H&P (Signed)
Seen and examined.  Agree with Dr. Jeri Cos, documentation and management.  Briefly  79 yo male with one hx of illness which began with rigors and progressed to rhinorrhea, generalized weakness and minimal cough.  Denies pain or dysuria.  Clearly he is improving.  Issues: Acute febrile illness, clinically resolving without treatment.  His clinical improvement strongly points to a viral etiology - likely influenza either a strain that was not covered by the flu shot he received or a partial case due to sub optimal immune response to the immunization.  Less likely are bacterial causes - either urinary or pneumonia.    Also has impressive third degree heart block.  The only addition I have is to put him on resp. Isolation.  He is not coughing now.  Still, since ILI is my leading diagnosis, I think it is prudent to put him on droplet precautions.

## 2012-04-28 ENCOUNTER — Inpatient Hospital Stay (HOSPITAL_COMMUNITY): Payer: Medicare Other

## 2012-04-28 DIAGNOSIS — E440 Moderate protein-calorie malnutrition: Secondary | ICD-10-CM

## 2012-04-28 DIAGNOSIS — M79609 Pain in unspecified limb: Secondary | ICD-10-CM

## 2012-04-28 DIAGNOSIS — R7881 Bacteremia: Secondary | ICD-10-CM

## 2012-04-28 DIAGNOSIS — N401 Enlarged prostate with lower urinary tract symptoms: Secondary | ICD-10-CM

## 2012-04-28 DIAGNOSIS — M79669 Pain in unspecified lower leg: Secondary | ICD-10-CM

## 2012-04-28 LAB — BASIC METABOLIC PANEL
CO2: 23 mEq/L (ref 19–32)
Chloride: 104 mEq/L (ref 96–112)
Creatinine, Ser: 1.34 mg/dL (ref 0.50–1.35)
GFR calc Af Amer: 55 mL/min — ABNORMAL LOW (ref >90)
Potassium: 4.1 mEq/L (ref 3.5–5.1)

## 2012-04-28 LAB — CBC
MCV: 93.2 fL (ref 78.0–100.0)
Platelets: 229 10*3/uL (ref 150–400)
RBC: 3.96 MIL/uL — ABNORMAL LOW (ref 4.22–5.81)
WBC: 9.1 10*3/uL (ref 4.0–10.5)

## 2012-04-28 LAB — INFLUENZA PANEL BY PCR (TYPE A & B)
Influenza A By PCR: NEGATIVE
Influenza B By PCR: NEGATIVE

## 2012-04-28 MED ORDER — LISINOPRIL 5 MG PO TABS
5.0000 mg | ORAL_TABLET | Freq: Every day | ORAL | Status: DC
  Administered 2012-04-28 – 2012-04-29 (×2): 5 mg via ORAL
  Filled 2012-04-28 (×2): qty 1

## 2012-04-28 MED ORDER — CEFTRIAXONE SODIUM 1 G IJ SOLR
1.0000 g | INTRAMUSCULAR | Status: DC
  Administered 2012-04-28: 1 g via INTRAVENOUS
  Filled 2012-04-28 (×2): qty 10

## 2012-04-28 NOTE — Progress Notes (Signed)
*  PRELIMINARY RESULTS* Vascular Ultrasound Right lower extremity venous duplex has been completed.  Preliminary findings: No evidence of DVT. 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   Farrel Demark, RDMS, RVT  04/28/2012, 10:41 AM

## 2012-04-28 NOTE — Progress Notes (Addendum)
Seen and examined.  Feeling great.   I am very surprised by the positive blood culture for gram neg rods.  This focuses my attention on the urinary track and I am no longer concerned about pneumonia.  Continue iv antibiotics until sensitivities are available.    Two additional problems to document.   1. On admit, patient had hyponatremia, likely due to his dehydration.   2. On admit, he had moderate malnutrition: Patient meets criteria for Moderate Malnutrition in the context of acute illness, given less than 75% intake of energy requirement for > 7 days. Patient reports decreased appetite 10 days prior to admission and a 7 pound weight loss during that time period.

## 2012-04-28 NOTE — Progress Notes (Signed)
Family Medicine Teaching Service Daily Progress Note Service Page: [phone]  Subjective:  Feeling well this morning.  Reports decreased appetite this am, which he attributes to Doxycycline.  Overnight Events: RN informed me of + Blood Culture - Gram negative rods.  Objective: Temp:  [97.8 F (36.6 C)-99 F (37.2 C)] 98.1 F (36.7 C) (03/07 0352) Pulse Rate:  [38-92] 38 (03/07 0352) Resp:  [20] 20 (03/07 0352) BP: (110-132)/(54-67) 132/62 mmHg (03/07 0352) SpO2:  [96 %-100 %] 96 % (03/07 0352)  Exam: General: well appearing, NAD. Cardiovascular: Bradycardic. Regular rhythm. No murmurs, rubs, or gallops. Respiratory: Rales appreciated in right base.  No rhonchi or wheezing. Abdomen: soft, nontender, nondistended. Extremities: warm, well perfused. No edema  CBC BMET   Recent Labs Lab 04/26/12 1746 04/26/12 2223 04/27/12 0505  WBC 15.4* 12.2* 12.0*  HGB 15.9 13.0 13.9  HCT 49.0 36.7* 40.0  PLT  --  212 234    Recent Labs Lab 04/26/12 1733 04/26/12 2223 04/27/12 0505  NA 131* 128* 135  K 4.5 4.5 4.5  CL 96 97 99  CO2 24 20 25   BUN 36* 40* 37*  CREATININE 1.53* 1.57* 1.51*  GLUCOSE 120* 119* 100*  CALCIUM 9.7 8.8 9.3     Urinalysis    Component Value Date/Time   COLORURINE AMBER* 04/27/2012 0019   APPEARANCEUR TURBID* 04/27/2012 0019   LABSPEC 1.020 04/27/2012 0019   PHURINE 5.0 04/27/2012 0019   GLUCOSEU NEGATIVE 04/27/2012 0019   HGBUR MODERATE* 04/27/2012 0019   BILIRUBINUR SMALL* 04/27/2012 0019   BILIRUBINUR small 04/26/2012 1746   KETONESUR 15* 04/27/2012 0019   PROTEINUR 100* 04/27/2012 0019   UROBILINOGEN 1.0 04/27/2012 0019   UROBILINOGEN 0.2 04/26/2012 1746   NITRITE NEGATIVE 04/27/2012 0019   NITRITE neg 04/26/2012 1746   LEUKOCYTESUR LARGE* 04/27/2012 0019   Imaging/Diagnostic Tests:  X-ray Chest Pa And Lateral  04/26/2012   IMPRESSION: Stable appearance to bilateral calcified pleural plaques.  There could be minimal superimposed right basilar opacity, which may  reflect atelectasis or possibly very mild pneumonia.   Assessment/Plan: 79 y.o. year old male with PMH of CAD, HTN, and Complete heart block presents with rigors, malaise, and fatigue x 1 week.  Patient also noted to have urinary urgency and frequency with malodorous urine.  Work up revealed mild leukocytosis of 15.4 and positive UA (large leukocytes, TNTC WBC's, many bacteria).  # Bacteremia secondary to UTI - Blood Culture returned last night.  + for Gram negative rods.  Urine culture revealed >100,000 CFU of E.coli - Given culture results, empiric Zosyn changed to IV Rocephin. Awaiting final culture results.  # Possible CAP - Chest xray revealed possible right basilar opacity which could represent PNA.   - Empiric Zosyn (see above) will cover majority of organisms if patient has underlying PNA as well.  - Repeat chest xray this am.  # AKI- Creatinine elevated to 1.57 on admission - Now resolved. Creatinine 1.34 (WNL) this am.  # Calf pain - Patient concerned for DVT.  Palpable cord present.  LE doppler ordered to rule out DVT although this is highly unlikely.  # Complete Heart Block - Long standing issue for patient.  No pacemaker placed. - Discussed with Cardiology. - Will continue to monitor closely.   # Hx of Prostate enlargement - Previously been followed by [facility]. - Will continue Flomax (started 3/6).  Patient retaining urine with post void residual of 263 this am.  Will need follow up with Urology upon discharge.  FEN/GI: Saline lock IV. Heart healthy diet. PPx: Heparin Dispo: Pending continued clinical improvement and blood culture results. Code Status: Full  Everlene Other, DO 04/28/2012, 6:50 AM

## 2012-04-28 NOTE — Progress Notes (Signed)
CRITICAL VALUE ALERT  Critical value received:  + Blood culture. + gram negative rods.   Date of notification:  2/7/20014  Time of notification:  0023   Critical value read back:yes  Nurse who received alert:  Starlyn Skeans, RN  MD notified (1st page):  Dr. Glendell Docker   Time of first page:  0035  MD notified (2nd page):  Time of second page:  Responding MD:  Dr.  Glendell Docker   Time MD responded:  [phone]

## 2012-04-28 NOTE — Discharge Summary (Signed)
Family Medicine Teaching [facility] Discharge Summary  Patient name: Chad Russell Medical record number: [id number] Date of birth: 07-02-1926 Age: 79 y.o. Gender: male Date of Admission: 04/26/2012  Date of Discharge: 04/29/12 Admitting Physician: Sanjuana Letters, MD  Primary Care Provider: Shade Flood, MD  Indication for Hospitalization: Leukocytosis, Positive UA, Rigors, Fever  Discharge Diagnoses:  Bacteremia - gram negative rod UTI Acute Kidney Injury Hyponatremia secondary to dehydration Benign prostatic hyperplasia with lower urinary tract symptoms HTN Moderate malnutrition Complete Heart Block Influenza-like illness CAD  Brief Hospital Course:  79 y.o. year old male with PMH of CAD, HTN, and Complete heart block presents with recent rigors, malaise, and fatigue. Patient also reported urinary urgency and frequency with malodorous urine. Work up revealed mild leukocytosis of 15.4 and positive UA (large leukocytes, TNTC WBC's, many bacteria).  1) Gram negative rod bacteremia secondary to urinary tract infection - Initial UA (from Urgent care) revealed large leukocytes, TNTC WBC's. - Blood cultures, urine culture and gram stain were obtained on admission.  Gram stain was concerning for enterococcus and patient was started on empiric IV Zosyn. - Urine culture returned positive for >100,000 CFU of E.coli that was pansensitive and blood culture was positive for E.coli as well.   - Patient was treated with IV Zosyn for 2 days and then transitioned to IV CTX while awaiting culture sensitivities. - Patient improved following antibiotics and patient was discharged on 7 day course of Keflex.  2) Acute Kidney injury  - Creatinine elevated at 1.57 on admission. Patient reported poor PO intake over the past several days. - Creatinine returned to baseline following IV fluids and improvement in PO intake.  3) Hyponatremia secondary to dehydration - Sodium 131 on  admission - Resolved following IV fluids and improvement in PO intake.  4) Benign prostatic hyperplasia with lower urinary tract symptoms - Patient started on Flomax during admission.   - Post void residuals were also obtained - 360 mL and 263 mL.   No foley catheter was placed.  Patient will need close outpatient follow up with Urology [facility] urology).  5) HTN - Home lisinopril was continued during admission.  6) Moderate malnutrion - In the setting of decreased PO intake and acute illness, patient met criteria for malnutrition. - Patient declined nutritional intervention.  Appetite improved during admission.  7) Complete heart block - Asymptomatic, no pacemaker. Discussed with cardiology who stated to monitor during admission.   8) Influenza-like illness - Patient reported recent cough and rhinorrhea on admission.  Patient likely had an underlying influenza-like illness prior to admission. - They was concern for PNA on admission and chest xray was obtained.  Patient also initally placed on Doxycline.  This was quickly discontinued as chest xray findings were not consistent with PNA.  Patient also tested for Influenza during admission, which was negative.  9) Calf pain - US obtained to rule out DVT.  Revealed chronic superficial thrombosis of small saphenous vein.  No DVT evident.  10) CAD - Continued daily Aspirin 81 mg.  Significant Labs and Imaging:   CBC BMET   Recent Labs Lab 04/27/12 0505 04/28/12 0725 04/29/12 0700  WBC 12.0* 9.1 7.7  HGB 13.9 12.8* 13.8  HCT 40.0 36.9* 39.8  PLT 234 229 291    Recent Labs Lab 04/27/12 0505 04/28/12 0725 04/29/12 0700  NA 135 137 139  K 4.5 4.1 3.9  CL 99 104 104  CO2 25 23 26   BUN 37* 29* 23  CREATININE  1.51* 1.34 1.18  GLUCOSE 100* 98 89  CALCIUM 9.3 8.6 9.0     Results for orders placed during the hospital encounter of 04/26/12  CULTURE, BLOOD (ROUTINE X 2)     Status: None   Collection Time    04/26/12 10:15  PM      Result Value Range Status   Specimen Description BLOOD LEFT FOREARM   Final   Special Requests BOTTLES DRAWN AEROBIC AND ANAEROBIC 10CC EACH   Final   Culture  Setup Time 04/27/2012 09:44   Final   Culture     Final   Value: ESCHERICHIA COLI     Note: Gram Stain Report Called to,Read Back By and Verified With: STEPHANIE AT 0710 04/29/12 BY SNOLO   Report Status PENDING   Incomplete  CULTURE, BLOOD (ROUTINE X 2)     Status: None   Collection Time    04/26/12 10:25 PM      Result Value Range Status   Specimen Description BLOOD LEFT FOREARM   Final   Special Requests BOTTLES DRAWN AEROBIC ONLY 10CC   Final   Culture  Setup Time 04/27/2012 09:44   Final   Culture     Final   Value: ESCHERICHIA COLI     Note: Gram Stain Report Called to,Read Back By and Verified With: REGGIE MANNING ON 04/28/2012 AT 12:25A BY WILEJ   Report Status PENDING   Incomplete  URINE CULTURE     Status: None   Collection Time    04/27/12 12:19 AM      Result Value Range Status   Specimen Description URINE, RANDOM   Final   Special Requests NONE   Final   Culture  Setup Time 04/27/2012 09:47   Final   Colony Count >=100,000 COLONIES/ML   Final   Culture ESCHERICHIA COLI   Final   Report Status 04/29/2012 FINAL   Final   Organism ID, Bacteria ESCHERICHIA COLI   Final  GRAM STAIN     Status: None   Collection Time    04/27/12 12:19 AM      Result Value Range Status   Specimen Description URINE, RANDOM   Final   Special Requests NONE   Final   Gram Stain     Final   Value: CYTOSPIN SLIDE     WBC PRESENT,BOTH PMN AND MONONUCLEAR     GRAM NEGATIVE RODS     GRAM POSITIVE COCCI IN PAIRS   Report Status 04/27/2012 FINAL   Final  CULTURE, BLOOD (SINGLE)     Status: None   Collection Time    04/28/12  7:25 AM      Result Value Range Status   Specimen Description BLOOD LEFT ARM   Final   Special Requests     Final   Value: BOTTLES DRAWN AEROBIC AND ANAEROBIC AERO 10CC ANA 5CC   Culture  Setup Time  04/28/2012 15:06   Final   Culture     Final   Value:        BLOOD CULTURE RECEIVED NO GROWTH TO DATE CULTURE WILL BE HELD FOR 5 DAYS BEFORE ISSUING A FINAL NEGATIVE REPORT   Report Status PENDING   Incomplete   Urinalysis    Component Value Date/Time   COLORURINE AMBER* 04/27/2012 0019   APPEARANCEUR TURBID* 04/27/2012 0019   LABSPEC 1.020 04/27/2012 0019   PHURINE 5.0 04/27/2012 0019   GLUCOSEU NEGATIVE 04/27/2012 0019   HGBUR MODERATE* 04/27/2012 0019   BILIRUBINUR SMALL* 04/27/2012 0019  BILIRUBINUR small 04/26/2012 1746   KETONESUR 15* 04/27/2012 0019   PROTEINUR 100* 04/27/2012 0019   UROBILINOGEN 1.0 04/27/2012 0019   UROBILINOGEN 0.2 04/26/2012 1746   NITRITE NEGATIVE 04/27/2012 0019   NITRITE neg 04/26/2012 1746   LEUKOCYTESUR LARGE* 04/27/2012 0019   Procedures: None  Consultations: None  Discharge Medications:    Medication List    STOP taking these medications       aspirin 81 MG chewable tablet     HYDROcodone-acetaminophen 5-500 MG per tablet  Commonly known as:  VICODIN     lisinopril 5 MG tablet  Commonly known as:  PRINIVIL,ZESTRIL     Prostate Tabs      TAKE these medications       aspirin 81 MG tablet  Take 81 mg by mouth daily.     beta carotene 16109 UNIT capsule  Take 25,000 Units by mouth daily.     Biotin 10 MG Tabs  Take 10 mg by mouth daily.     cephALEXin 500 MG capsule  Commonly known as:  KEFLEX  Take 1 capsule (500 mg total) by mouth 3 (three) times daily.     cholecalciferol 1000 UNITS tablet  Commonly known as:  VITAMIN D  Take 4,000 Units by mouth daily.     co-enzyme Q-10 30 MG capsule  Take 30 mg by mouth daily.     lisinopril-hydrochlorothiazide 10-12.5 MG per tablet  Commonly known as:  PRINZIDE,ZESTORETIC  Take 0.5 tablets by mouth daily.     LUTEIN-ZEAXANTHIN PO  Take by mouth daily.     magnesium oxide 400 MG tablet  Commonly known as:  MAG-OX  Take 400 mg by mouth daily.     OVER THE COUNTER MEDICATION  Take 500 mg by mouth  daily. Tumeric 500mg      PROBIOTIC DAILY PO  Take 1 tablet by mouth daily.     Resveratrol 250 MG Caps  Take 250 mg by mouth daily.     tamsulosin 0.4 MG Caps  Commonly known as:  FLOMAX  Take 1 capsule (0.4 mg total) by mouth daily.     vitamin B-12 1000 MCG tablet  Commonly known as:  CYANOCOBALAMIN  Take 1,000 mcg by mouth daily.     vitamin C 500 MG tablet  Commonly known as:  ASCORBIC ACID  Take 500 mg by mouth daily.       Issues for Follow Up:  1) Completion of antibiotic course 2) Ensure follow up with Urology for BPH and urinary retention. 3) Follow up appetite/PO intake  Outstanding Results: Final Blood culture   Discharge Instructions: Patient was counseled important signs and symptoms that should prompt return to medical care, changes in medications, dietary instructions, activity restrictions, and follow up appointments.   Follow-up Information   Schedule an appointment as soon as possible for a visit with Shade Flood, MD.   Contact information:   [address] [place] [address] [phone]       Schedule an appointment as soon as possible for a visit with [facility] .   Contact information:   [address] [place], [place] [address] [phone]      Discharge Condition: Stable. Discharged home.  Everlene Other, DO 04/29/2012, 8:17 PM

## 2012-04-29 DIAGNOSIS — R7881 Bacteremia: Secondary | ICD-10-CM

## 2012-04-29 DIAGNOSIS — E440 Moderate protein-calorie malnutrition: Secondary | ICD-10-CM

## 2012-04-29 LAB — URINE CULTURE

## 2012-04-29 LAB — BASIC METABOLIC PANEL
BUN: 23 mg/dL (ref 6–23)
CO2: 26 mEq/L (ref 19–32)
Calcium: 9 mg/dL (ref 8.4–10.5)
Chloride: 104 mEq/L (ref 96–112)
Creatinine, Ser: 1.18 mg/dL (ref 0.50–1.35)
GFR calc Af Amer: 64 mL/min — ABNORMAL LOW (ref >90)
GFR calc non Af Amer: 55 mL/min — ABNORMAL LOW (ref >90)
Glucose, Bld: 89 mg/dL (ref 70–99)
Potassium: 3.9 mEq/L (ref 3.5–5.1)
Sodium: 139 mEq/L (ref 135–145)

## 2012-04-29 LAB — CBC
HCT: 39.8 % (ref 39.0–52.0)
Hemoglobin: 13.8 g/dL (ref 13.0–17.0)
MCH: 32.5 pg (ref 26.0–34.0)
MCHC: 34.7 g/dL (ref 30.0–36.0)
MCV: 93.6 fL (ref 78.0–100.0)
Platelets: 291 10*3/uL (ref 150–400)
RBC: 4.25 MIL/uL (ref 4.22–5.81)
RDW: 13.7 % (ref 11.5–15.5)
WBC: 7.7 10*3/uL (ref 4.0–10.5)

## 2012-04-29 MED ORDER — TAMSULOSIN HCL 0.4 MG PO CAPS: 0.4000 mg | ORAL_CAPSULE | Freq: Every day | ORAL | Status: DC

## 2012-04-29 MED ORDER — CEPHALEXIN 500 MG PO CAPS: 500.0000 mg | ORAL_CAPSULE | Freq: Three times a day (TID) | ORAL | Status: DC

## 2012-04-29 NOTE — Progress Notes (Signed)
Pt discharged per MD order and protocol. All discharge instructions reviewed with patient and patient aware of all follow up appointments. All questions answered.

## 2012-04-29 NOTE — Progress Notes (Signed)
Seen and examined.  Chart reviewed.  Discussed with Dr. Mikel Cella.  Agree with admit, her documentation and management.  Briefly. 79 yo male with "cold" x 1 month.  Worsening SOB for past week.  Admits that she had not been taking any of her chronic meds for the past 2 months "because I was stupid."  Admitted with a combo of problems which are all feeding on themselves. Issues CHF - does appear volume overloaded and needs diuresis Acute exacerbation of COPD.   Hypertensive urgency.  She is responding to treatments and I anticipate steady improvement.  We will follow.  Will need to emphasize compliance and smoking cessation at DC.

## 2012-04-29 NOTE — Progress Notes (Signed)
Family Medicine Teaching Service Daily Progress Note Service Page: [phone]  Subjective:  Feeling much improved this morning.  No complaints.  States urination is much improved following flomax. Denies fever, chills, abdominal pain.  Objective: Temp:  [97.3 F (36.3 C)-98.4 F (36.9 C)] 97.4 F (36.3 C) (03/08 0426) Pulse Rate:  [33-41] 33 (03/08 0426) Resp:  [18] 18 (03/08 0426) BP: (126-156)/(53-69) 126/53 mmHg (03/08 0426) SpO2:  [96 %-99 %] 98 % (03/08 0426)  Exam: General: well appearing, NAD. Cardiovascular: Bradycardic. Regular rhythm. No murmurs, rubs, or gallops. Respiratory: Rales appreciated in right base.  No rhonchi or wheezing. Abdomen: soft, nontender, nondistended. Extremities: warm, well perfused. No edema  CBC BMET   Recent Labs Lab 04/26/12 2223 04/27/12 0505 04/28/12 0725  WBC 12.2* 12.0* 9.1  HGB 13.0 13.9 12.8*  HCT 36.7* 40.0 36.9*  PLT 212 234 229    Recent Labs Lab 04/26/12 2223 04/27/12 0505 04/28/12 0725  NA 128* 135 137  K 4.5 4.5 4.1  CL 97 99 104  CO2 20 25 23   BUN 40* 37* 29*  CREATININE 1.57* 1.51* 1.34  GLUCOSE 119* 100* 98  CALCIUM 8.8 9.3 8.6     Urinalysis    Component Value Date/Time   COLORURINE AMBER* 04/27/2012 0019   APPEARANCEUR TURBID* 04/27/2012 0019   LABSPEC 1.020 04/27/2012 0019   PHURINE 5.0 04/27/2012 0019   GLUCOSEU NEGATIVE 04/27/2012 0019   HGBUR MODERATE* 04/27/2012 0019   BILIRUBINUR SMALL* 04/27/2012 0019   BILIRUBINUR small 04/26/2012 1746   KETONESUR 15* 04/27/2012 0019   PROTEINUR 100* 04/27/2012 0019   UROBILINOGEN 1.0 04/27/2012 0019   UROBILINOGEN 0.2 04/26/2012 1746   NITRITE NEGATIVE 04/27/2012 0019   NITRITE neg 04/26/2012 1746   LEUKOCYTESUR LARGE* 04/27/2012 0019   Results for orders placed during the hospital encounter of 04/26/12  CULTURE, BLOOD (ROUTINE X 2)     Status: None   Collection Time    04/26/12 10:15 PM      Result Value Range Status   Specimen Description BLOOD LEFT FOREARM   Final   Special  Requests BOTTLES DRAWN AEROBIC AND ANAEROBIC 10CC EACH   Final   Culture  Setup Time 04/27/2012 09:44   Final   Culture     Final   Value:        BLOOD CULTURE RECEIVED NO GROWTH TO DATE CULTURE WILL BE HELD FOR 5 DAYS BEFORE ISSUING A FINAL NEGATIVE REPORT   Report Status PENDING   Incomplete  CULTURE, BLOOD (ROUTINE X 2)     Status: None   Collection Time    04/26/12 10:25 PM      Result Value Range Status   Specimen Description BLOOD LEFT FOREARM   Final   Special Requests BOTTLES DRAWN AEROBIC ONLY 10CC   Final   Culture  Setup Time 04/27/2012 09:44   Final   Culture     Final   Value: GRAM NEGATIVE RODS     Note: Gram Stain Report Called to,Read Back By and Verified With: REGGIE MANNING ON 04/28/2012 AT 12:25A BY WILEJ   Report Status PENDING   Incomplete  URINE CULTURE     Status: None   Collection Time    04/27/12 12:19 AM      Result Value Range Status   Specimen Description URINE, RANDOM   Final   Special Requests NONE   Final   Culture  Setup Time 04/27/2012 09:47   Final   Colony Count >=100,000 COLONIES/ML  Final   Culture ESCHERICHIA COLI   Final   Report Status PENDING   Incomplete  GRAM STAIN     Status: None   Collection Time    04/27/12 12:19 AM      Result Value Range Status   Specimen Description URINE, RANDOM   Final   Special Requests NONE   Final   Gram Stain     Final   Value: CYTOSPIN SLIDE     WBC PRESENT,BOTH PMN AND MONONUCLEAR     GRAM NEGATIVE RODS     GRAM POSITIVE COCCI IN PAIRS   Report Status 04/27/2012 FINAL   Final   Imaging/Diagnostic Tests:  X-ray Chest Pa And Lateral  04/26/2012   IMPRESSION: Stable appearance to bilateral calcified pleural plaques.  There could be minimal superimposed right basilar opacity, which may reflect atelectasis or possibly very mild pneumonia.   Assessment/Plan: 79 y.o. year old male with PMH of CAD, HTN, and Complete heart block presents with rigors, malaise, and fatigue x 1 week.  Patient also noted to have  urinary urgency and frequency with malodorous urine.  Work up revealed mild leukocytosis of 15.4 and positive UA (large leukocytes, TNTC WBC's, many bacteria).  # Bacteremia secondary to UTI - Blood Culture returned last night.  + for Gram negative rods.  Urine culture revealed >100,000 CFU of E.coli - Given culture results, empiric Zosyn changed to IV Rocephin. Awaiting final culture results.  # Recent influenza like illness with questionable PNA on admission - Chest xray revealed possible right basilar opacity which could represent PNA.  Repeat Chest xray was negative for infiltrate. - Influenza negative  # AKI- Creatinine elevated to 1.57 on admission - Now resolved. Creatinine 1.34 (WNL) this am.  # Hyponatremia secondary to dehydration - Now resolved  # Calf pain - Patient concerned for DVT.  Palpable cord present.  - LE doppler obtained yesterday and was negative for DVT.  Did revealed thrombosis of superficial saphenous vein.  # Complete Heart Block - Long standing issue for patient.  No pacemaker placed. - Discussed with Cardiology. - Will continue to monitor closely.   # Hx of Prostate enlargement - Previously been followed by [facility]. - Will continue Flomax (started 3/6).  Patient retaining urine with post void residual of 263 this am.  Will need follow up with Urology upon discharge.  # Moderate malnutrition  - In the setting of decreased PO intake and acute illness, patient met criteria for malnutrition.  - Patient declined nutritional intervention. Appetite improved during admission.  FEN/GI: Saline lock IV. Heart healthy diet. PPx: Heparin Dispo: Planned D/C home today following final culture results. Code Status: Full  Everlene Other, DO 04/29/2012, 6:36 AM

## 2012-04-30 LAB — CULTURE, BLOOD (ROUTINE X 2)

## 2012-04-30 NOTE — Discharge Summary (Signed)
Seen and examined on the day of DC.  Agree with DC as outlined by Dr. Cook. 

## 2012-05-04 LAB — CULTURE, BLOOD (SINGLE)

## 2012-05-10 ENCOUNTER — Telehealth: Payer: Self-pay | Admitting: Radiology

## 2012-05-10 NOTE — Telephone Encounter (Signed)
Nurse practitioner with [facility] called to advise patient home with BP 182/90. He is compliant with his medications. The nurse practitioner did advise patient to follow up here with Dr Neva Seat regarding his elevated blood pressure. He is going to check this also at the drug store, as his home unit may not be accurate.

## 2012-06-05 ENCOUNTER — Encounter: Payer: Self-pay | Admitting: Family Medicine

## 2012-06-05 ENCOUNTER — Ambulatory Visit (INDEPENDENT_AMBULATORY_CARE_PROVIDER_SITE_OTHER): Payer: Medicare Other | Admitting: Family Medicine

## 2012-06-05 VITALS — BP 170/86 | HR 38 | Temp 97.5°F | Resp 16 | Ht 71.0 in | Wt 185.6 lb

## 2012-06-05 DIAGNOSIS — R5381 Other malaise: Secondary | ICD-10-CM

## 2012-06-05 DIAGNOSIS — Z8619 Personal history of other infectious and parasitic diseases: Secondary | ICD-10-CM

## 2012-06-05 DIAGNOSIS — I498 Other specified cardiac arrhythmias: Secondary | ICD-10-CM

## 2012-06-05 DIAGNOSIS — I442 Atrioventricular block, complete: Secondary | ICD-10-CM

## 2012-06-05 DIAGNOSIS — R001 Bradycardia, unspecified: Secondary | ICD-10-CM

## 2012-06-05 DIAGNOSIS — Z87898 Personal history of other specified conditions: Secondary | ICD-10-CM

## 2012-06-05 DIAGNOSIS — I10 Essential (primary) hypertension: Secondary | ICD-10-CM

## 2012-06-05 LAB — CBC WITH DIFFERENTIAL/PLATELET
Eosinophils Relative: 4 % (ref 0–5)
HCT: 40.5 % (ref 39.0–52.0)
Hemoglobin: 13.3 g/dL (ref 13.0–17.0)
Lymphocytes Relative: 34 % (ref 12–46)
MCV: 94.4 fL (ref 78.0–100.0)
Monocytes Absolute: 0.9 10*3/uL (ref 0.1–1.0)
Monocytes Relative: 12 % (ref 3–12)
Neutro Abs: 3.7 10*3/uL (ref 1.7–7.7)
RDW: 15.7 % — ABNORMAL HIGH (ref 11.5–15.5)
WBC: 7.5 10*3/uL (ref 4.0–10.5)

## 2012-06-05 LAB — BASIC METABOLIC PANEL
BUN: 23 mg/dL (ref 6–23)
CO2: 26 mEq/L (ref 19–32)
Chloride: 105 mEq/L (ref 96–112)
Creat: 1.07 mg/dL (ref 0.50–1.35)
Glucose, Bld: 80 mg/dL (ref 70–99)
Potassium: 4.7 mEq/L (ref 3.5–5.3)

## 2012-06-05 LAB — TSH: TSH: 3.556 u[IU]/mL (ref 0.350–4.500)

## 2012-06-05 NOTE — Patient Instructions (Signed)
Keep a record of your blood pressures outside of the office and bring them to the next office visit, as well as your meter. Recheck in next 2 weeks. We will refer you back to Dr. Antoine Poche. Return to the clinic or go to the nearest emergency room if any of your symptoms worsen or new symptoms occur.

## 2012-06-05 NOTE — Progress Notes (Signed)
Subjective:    Patient ID: Chad Russell, male    DOB: 01-Mar-1926, 79 y.o.   MRN: [id number]  HPI Chad Russell is a 79 y.o. male  Complicated history with hospitalization March of this year for E coli bacteremia/uti. discharged 04/29/12. - Urine culture returned positive for >100,000 CFU of E.coli that was pansensitive and blood culture was positive for E.coli as well. - Patient was treated with IV Zosyn for 2 days and then transitioned to IV CTX while awaiting culture sensitivities. - Patient improved following antibiotics and patient was discharged on 7 day course of Keflex. Creatinine elevated at 1.57 on admission, and hyponatremic, but Creatinine and sodium returned to baseline following IV fluids and improvement in PO intake. Benign prostatic hyperplasia with lower urinary tract symptoms - Patient started on Flomax during admission. Planned on follow up with [facility].   Here for hospital follow up.  Will be following up with me as primary care provider. Seen by urology 05/29/12 - saw Dr Annabell Howells.  Next appt June 6th. Had urine test there - clear.  No bloodwork.  Feels much better. Urinating better on flomax. No fevers recently. Some decreased energy overall past 6 months.  Possible low testosterone in past.  Eating and drinking ok.  Drinking fluids, but trying to drink more. No lightheadness, dizziness or chest pains.  Longstanding bradycardia, but now with fatigue past 6 months.   Outside BP's: 130's over 70's.  Hx of white coat syndrome by his report, as higher usually in our office.   05/10/12 phone call - Nurse practitioner with [facility] called to advise patient home with BP 182/90. No chest pains, not dizzy, no headaches, no slurred speech.   Review of Systems  Constitutional: Positive for fatigue. Negative for unexpected weight change.  Eyes: Negative for visual disturbance.  Respiratory: Negative for cough, chest tightness and shortness of breath.   Cardiovascular:  Negative for chest pain, palpitations and leg swelling.  Gastrointestinal: Negative for abdominal pain and blood in stool.  Neurological: Negative for dizziness, weakness (generalized past 6 months. ), light-headedness and headaches.       Objective:   Physical Exam  Vitals reviewed. Constitutional: He is oriented to person, place, and time. He appears well-developed and well-nourished.  HENT:  Head: Normocephalic and atraumatic.  Eyes: EOM are normal. Pupils are equal, round, and reactive to light. No scleral icterus.  Neck: No JVD present. Carotid bruit is not present.  Cardiovascular: Regular rhythm, normal heart sounds and normal pulses.   No extrasystoles are present. Bradycardia present.   No murmur heard. Pulmonary/Chest: Effort normal and breath sounds normal. He has no rales.  Abdominal: He exhibits no distension. There is no tenderness. There is no CVA tenderness.  Musculoskeletal: He exhibits no edema.  Neurological: He is alert and oriented to person, place, and time.  Skin: Skin is warm and dry.  Psychiatric: He has a normal mood and affect. His behavior is normal.       Assessment & Plan:  [name] is a 79 y.o. male Other malaise and fatigue - Plan: Ambulatory referral to Cardiology, TSH, CBC with Differential, Basic metabolic panel  HTN (hypertension) - Plan: Ambulatory referral to Cardiology, TSH, CBC with Differential, Basic metabolic panel  Complete heart block - Plan: Ambulatory referral to Cardiology, TSH, CBC with Differential, Basic metabolic panel  Bradycardia - Plan: Ambulatory referral to Cardiology, TSH, CBC with Differential, Basic metabolic panel  Hx of bacteremia - Plan: TSH, CBC  with Differential, Basic metabolic panel   Hx of bradycardia, complete heart block - will refer back to Dr Antoine Poche given hx of complete block - prior asx, but now possible symptomatic bradycardia.  Can also discuss bp with Dr. Antoine Poche. In regards to fatigue - will  also check tsh, bmp, cbc. Follow up in next 2 weeks. Rtc/er/911 precautions discussed with patient and wife in room.   HTN - ? White coat component with isoilated systolic component. Check outide bp's and bring meter to next ov in few weeks. Continue same meds at this point.   Prior UTI bacteremia- doing well and s/p urology follow up.   Plan to discuss testosterone testing with urologist.   Patient Instructions  Keep a record of your blood pressures outside of the office and bring them to the next office visit, as well as your meter. Recheck in next 2 weeks. We will refer you back to Dr. Antoine Poche. Return to the clinic or go to the nearest emergency room if any of your symptoms worsen or new symptoms occur.

## 2012-06-06 ENCOUNTER — Other Ambulatory Visit: Payer: Self-pay | Admitting: Cardiology

## 2012-06-07 NOTE — Telephone Encounter (Signed)
..   Requested Prescriptions   Pending Prescriptions Disp Refills  . lisinopril-hydrochlorothiazide (PRINZIDE,ZESTORETIC) 10-12.5 MG per tablet [Pharmacy Med Name: LISINOPRIL/HCTZ 10-12.5MG  TAB] 30 tablet 1    Sig: TAKE 1 TABLET BY MOUTH DAILY.

## 2012-06-08 ENCOUNTER — Ambulatory Visit (INDEPENDENT_AMBULATORY_CARE_PROVIDER_SITE_OTHER): Payer: Medicare Other | Admitting: Physician Assistant

## 2012-06-08 ENCOUNTER — Encounter: Payer: Self-pay | Admitting: Physician Assistant

## 2012-06-08 VITALS — BP 148/80 | HR 36 | Ht 72.0 in | Wt 187.4 lb

## 2012-06-08 DIAGNOSIS — I2510 Atherosclerotic heart disease of native coronary artery without angina pectoris: Secondary | ICD-10-CM

## 2012-06-08 DIAGNOSIS — I4891 Unspecified atrial fibrillation: Secondary | ICD-10-CM | POA: Insufficient documentation

## 2012-06-08 DIAGNOSIS — I10 Essential (primary) hypertension: Secondary | ICD-10-CM

## 2012-06-08 DIAGNOSIS — I442 Atrioventricular block, complete: Secondary | ICD-10-CM

## 2012-06-08 MED ORDER — RIVAROXABAN 20 MG PO TABS: 20.0000 mg | ORAL_TABLET | Freq: Every day | ORAL | Status: DC

## 2012-06-08 NOTE — Progress Notes (Signed)
[address], [address] [place], [place]  [address] Phone: [phone] Fax:  [phone]  Date:  06/08/2012   ID:  Chad, Russell 10-29-1926, MRN [id number]  PCP:  Shade Flood, MD  Primary Cardiologist:  Dr. Rollene Rotunda     History of Present Illness: Chad Russell is a 79 y.o. male who returns for f/u.  He has a history of CAD, status post non-STEMI 1/13, complete heart block (patient has deferred pacemaker in the past), HTN. LHC 03/16/11: Mid LAD 10-20%, proximal circumflex 10%, mid RCA 20%, EF 55%. He was treated medically for his non-STEMI.  Echo 02/2011: EF 65-70%, normal wall motion, MAC, mild MR, mild LAE, mild RVE. Last seen by Dr. Antoine Poche 04/2011.  Regarding his complete heart block, he was stable at that time and pacemaker was planned. He was recently admitted 04/2012 with gram-negative bacteremia/urosepsis complicated by acute kidney injury.  He was seen back in followup by primary care.  He noted fatigue and was referred back here for followup.  The patient is an active octogenarian. 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 He denies chest pain or shortness of breath. He really denies any decreased exercise tolerance. He just notes fatigue over the last several months. He denies syncope or near-syncope. He denies palpitations. He denies orthopnea, PND or edema.  Labs (4/14):  K 4.7, Cr 1.07, Hgb 13.3, TSH 3.556  Wt Readings from Last 3 Encounters:  06/05/12 185 lb 9.6 oz (84.188 kg)  04/26/12 176 lb 9.4 oz (80.1 kg)  04/26/12 177 lb (80.287 kg)     Past Medical History  Diagnosis Date  . Hypertension   . Complete heart block   . NSTEMI (non-ST elevated myocardial infarction)   . Talar fracture     casted no surg  . CAD (coronary artery disease)     LHC 03/16/11: Mid LAD 10-20%, proximal circumflex 10%, mid RCA 20%, EF 55%.  . Atrial fibrillation   . Hx of echocardiogram     Echo 02/2011: EF 65-70%, normal wall motion, MAC, mild MR, mild LAE, mild RVE   . OSA (obstructive sleep apnea)     Current Outpatient Prescriptions  Medication Sig Dispense Refill  . aspirin 81 MG tablet Take 81 mg by mouth daily.      . beta carotene 69629 UNIT capsule Take 25,000 Units by mouth daily.      . Biotin 10 MG TABS Take 10 mg by mouth daily.      . cephALEXin (KEFLEX) 500 MG capsule Take 1 capsule (500 mg total) by mouth 3 (three) times daily.  21 capsule  0  . cholecalciferol (VITAMIN D) 1000 UNITS tablet Take 4,000 Units by mouth daily.       Marland Kitchen co-enzyme Q-10 30 MG capsule Take 30 mg by mouth daily.      Marland Kitchen lisinopril-hydrochlorothiazide (PRINZIDE,ZESTORETIC) 10-12.5 MG per tablet Take 0.5 tablets by mouth daily.      Marland Kitchen lisinopril-hydrochlorothiazide (PRINZIDE,ZESTORETIC) 10-12.5 MG per tablet TAKE 1 TABLET BY MOUTH DAILY.  30 tablet  1  . LUTEIN-ZEAXANTHIN PO Take by mouth daily.      . magnesium oxide (MAG-OX) 400 MG tablet Take 400 mg by mouth daily.      Marland Kitchen OVER THE COUNTER MEDICATION Take 500 mg by mouth daily. Tumeric 500mg       . Probiotic Product (PROBIOTIC DAILY PO) Take 1 tablet by mouth daily.      Marland Kitchen Resveratrol 250 MG CAPS Take 250 mg by mouth  daily.      . tamsulosin (FLOMAX) 0.4 MG CAPS Take 1 capsule (0.4 mg total) by mouth daily.  30 capsule  3  . vitamin B-12 (CYANOCOBALAMIN) 1000 MCG tablet Take 1,000 mcg by mouth daily.      . vitamin C (ASCORBIC ACID) 500 MG tablet Take 500 mg by mouth daily.       No current facility-administered medications for this visit.    Allergies:    Allergies  Allergen Reactions  . Doxycycline     Upset stomach    Social History:  The patient  reports that he quit smoking about 57 years ago. His smoking use included Cigarettes. He has a 3 pack-year smoking history. He does not have any smokeless tobacco history on file. He reports that he drinks about 1.2 ounces of alcohol per week. He reports that he does not use illicit drugs.   ROS:  Please see the history of present illness.   All other systems  reviewed and negative.   PHYSICAL EXAM: VS:  BP 148/80  Pulse 36  Ht 6' (1.829 m)  Wt 187 lb 6.4 oz (85.004 kg)  BMI 25.41 kg/m2 Well nourished, well developed, in no acute distress HEENT: normal Neck: no JVD Cardiac:  normal S1, S2; Slow RRR; no murmur Lungs:  clear to auscultation bilaterally, no wheezing, rhonchi or rales Abd: soft, nontender, no hepatomegaly Ext: no edema Skin: warm and dry Neuro:  CNs 2-12 intact, no focal abnormalities noted  EKG:  Atrial fibrillation, HR 36     ASSESSMENT AND PLAN:  1. Atrial Fibrillation:  His HR remains slow.  It is regular.  I reviewed his ECG with Dr. Hillis Range who was in the office today.  ECG c/w AFib and underlying CHB.  It is not clear that he is symptomatic with AFib.  Dr. Johney Frame recommending against DCCV due to his conduction system disease.  I do not think we need to pursue a rhythm control strategy at this point in time.  CHADS2=2.  He would benefit from anticoagulation.  We had a long discussion regarding warfarin and the NOACs.  He prefers one of the NOACs and he prefers once daily dosing.  His creatinine clearance is > 50.  I will place him on Xarelto 20 mg QD.  Arrange f/u CBC and BMET in 1 month.  Arrange visit with the CVRR clinic.   2. CAD:  No angina.  Stop ASA with start of NOAC.   3. Hypertension:  Controlled.  Continue current therapy.  4. Disposition:  F/u with Dr. Rollene Rotunda in 4-6 weeks.   Signed, Tereso Newcomer, PA-C  2:34 PM 06/08/2012

## 2012-06-08 NOTE — Patient Instructions (Addendum)
LABS TODAY; BMET, CBC W/DIFF IN 1 MONTH   PLEASE MAKE A NEW PT APPT WITH THE CV[facility] CLINIC FOR NEW START OF XARELTO THIS IS TO BE IN 1 MONTH YOU WILL HAVE LABS THEN  YOU HAVE A FOLLOW UP APPT WITH DR. HOCHREIN 07/13/12 @ 10:45  STOP ASPIRIN

## 2012-06-19 ENCOUNTER — Encounter: Payer: Self-pay | Admitting: Family Medicine

## 2012-06-19 ENCOUNTER — Ambulatory Visit (INDEPENDENT_AMBULATORY_CARE_PROVIDER_SITE_OTHER): Payer: Medicare Other | Admitting: Family Medicine

## 2012-06-19 VITALS — BP 136/90 | HR 34 | Temp 97.5°F | Resp 16 | Ht 71.0 in | Wt 183.4 lb

## 2012-06-19 DIAGNOSIS — R001 Bradycardia, unspecified: Secondary | ICD-10-CM

## 2012-06-19 DIAGNOSIS — R5381 Other malaise: Secondary | ICD-10-CM

## 2012-06-19 DIAGNOSIS — I10 Essential (primary) hypertension: Secondary | ICD-10-CM

## 2012-06-19 DIAGNOSIS — N390 Urinary tract infection, site not specified: Secondary | ICD-10-CM

## 2012-06-19 DIAGNOSIS — R5383 Other fatigue: Secondary | ICD-10-CM

## 2012-06-19 DIAGNOSIS — I498 Other specified cardiac arrhythmias: Secondary | ICD-10-CM

## 2012-06-19 DIAGNOSIS — I4891 Unspecified atrial fibrillation: Secondary | ICD-10-CM

## 2012-06-19 NOTE — Progress Notes (Signed)
Subjective:    Patient ID: Chad Russell, male    DOB: 04-Nov-1926, 79 y.o.   MRN: [id number]  HPI Chad Russell is a 79 y.o. male  See prior ov's.  Complicated history with hospitalization March of this year for E coli bacteremia/uti. discharged 04/29/12. - Urine culture returned positive for >100,000 CFU of E.coli that was pansensitive and blood culture was positive for E.coli as well. - Patient was treated with IV Zosyn for 2 days and then transitioned to IV CTX while awaiting culture sensitivities. - Patient improved following antibiotics and patient was discharged on 7 day course of Keflex. Creatinine elevated at 1.57 on admission, and hyponatremic, but Creatinine and sodium returned to baseline following IV fluids and improvement in PO intake. Benign prostatic hyperplasia with lower urinary tract symptoms - Patient started on Flomax during admission.  Urinating better on flomax, and follow up with urology 06/13/12 - noted to have recurrent UTI - started on Keflex. Plan on follow up there in 4 days for repeat u/a. No fever, improved urinary sx's.   Some decreased energy overall past 6 months discussed last ov.  No lightheadness, dizziness or chest pains. Hx of bradycardia, complete heart block - referred back to Dr Antoine Poche given hx of complete block - prior asx, but now possible symptomatic bradycardia. Seen by Tereso Newcomer, PA-C. Started on Xarelto in place of ASA.  Summary from cardiology ov: 1. Atrial Fibrillation: His HR remains slow. It is regular. I reviewed his ECG with Dr. Hillis Range who was in the office today. ECG c/w AFib and underlying CHB. It is not clear that he is symptomatic with AFib. Dr. Johney Frame recommending against DCCV due to his conduction system disease. I do not think we need to pursue a rhythm control strategy at this point in time. CHADS2=2. He would benefit from anticoagulation. We had a long discussion regarding warfarin and the NOACs. He prefers one of the NOACs and  he prefers once daily dosing. His creatinine clearance is > 50. I will place him on Xarelto 20 mg QD. Arrange f/u CBC and BMET in 1 month. Arrange visit with the CVRR clinic.  2. CAD: No angina. Stop ASA with start of NOAC.  3. Hypertension: Controlled. Continue current therapy.  4. Disposition: F/u with Dr. Rollene Rotunda in 4-6 weeks.    HTN- Outside BP's: 130's over 70's  by his report last ov. ? White coat component with isoilated systolic component. 136/90 in office today. Home readings: 105-138/56-82.  Denies chest pain, lightheadedness. Just fatigue. Denies depression sx's. Does have a court case in [place] that waiting on a decision on this.  That is weighing on his mind. Denies insomnia. Plans on discussing testosterone.     Results for orders placed in visit on 06/05/12  TSH      Result Value Range   TSH 3.556  0.350 - 4.500 uIU/mL  CBC WITH DIFFERENTIAL      Result Value Range   WBC 7.5  4.0 - 10.5 K/uL   RBC 4.29  4.22 - 5.81 MIL/uL   Hemoglobin 13.3  13.0 - 17.0 g/dL   HCT 40.9  81.1 - 91.4 %   MCV 94.4  78.0 - 100.0 fL   MCH 31.0  26.0 - 34.0 pg   MCHC 32.8  30.0 - 36.0 g/dL   RDW 78.2 (*) 95.6 - 21.3 %   Platelets 261  150 - 400 K/uL   Neutrophils Relative 50  43 - 77 %  Neutro Abs 3.7  1.7 - 7.7 K/uL   Lymphocytes Relative 34  12 - 46 %   Lymphs Abs 2.5  0.7 - 4.0 K/uL   Monocytes Relative 12  3 - 12 %   Monocytes Absolute 0.9  0.1 - 1.0 K/uL   Eosinophils Relative 4  0 - 5 %   Eosinophils Absolute 0.3  0.0 - 0.7 K/uL   Basophils Relative 0  0 - 1 %   Basophils Absolute 0.0  0.0 - 0.1 K/uL   Smear Review Criteria for review not met    BASIC METABOLIC PANEL      Result Value Range   Sodium 139  135 - 145 mEq/L   Potassium 4.7  3.5 - 5.3 mEq/L   Chloride 105  96 - 112 mEq/L   CO2 26  19 - 32 mEq/L   Glucose, Bld 80  70 - 99 mg/dL   BUN 23  6 - 23 mg/dL   Creat 1.61  0.96 - 0.45 mg/dL   Calcium 9.4  8.4 - 40.9 mg/dL      Review of Systems   Constitutional: Positive for fatigue. Negative for fever, chills and unexpected weight change.  Eyes: Negative for visual disturbance.  Respiratory: Negative for cough, chest tightness and shortness of breath.   Cardiovascular: Negative for chest pain, palpitations and leg swelling.  Gastrointestinal: Negative for abdominal pain and blood in stool.  Genitourinary: Negative for difficulty urinating (improving. see hpi. ).  Neurological: Negative for dizziness, light-headedness and headaches.      Objective:   Physical Exam  Vitals reviewed. Constitutional: He is oriented to person, place, and time. He appears well-developed and well-nourished.  HENT:  Head: Normocephalic and atraumatic.  Eyes: EOM are normal. Pupils are equal, round, and reactive to light.  Neck: No JVD present. Carotid bruit is not present.  Cardiovascular: Normal rate, regular rhythm and normal heart sounds.   No murmur heard. Pulmonary/Chest: Effort normal and breath sounds normal. He has no rales.  Abdominal: Soft. Bowel sounds are normal. There is no tenderness. There is no CVA tenderness.  Musculoskeletal: He exhibits no edema.  Neurological: He is alert and oriented to person, place, and time.  Skin: Skin is warm and dry.  Psychiatric: He has a normal mood and affect. His behavior is normal.      Assessment & Plan:  Chad Russell is a 79 y.o. male HTN (hypertension), Bradycardia, fatigue.  Hx longstanding bradycardia, afib with heart block, but prior asymptomatic. Fatigue past 6 months may be multifactorial with prior UTI, cardiac as above. Now on Xarelto for anticoagulation, and has follow up with cardiology planned. rtc precautions discussed.   Recurrent UTI - symptomatically improved - follow up as planned for repeat U/a and under care of urologist. rtc precautions.   HTN - home readings reviewed. Stable. No new meds.   Patient Instructions  Keep appointment with your cardiologist June 6th.  Discuss  your fatigue over the past 6 months with Dr. Antoine Poche to make sure your heart rate is not contributing to these symptoms.   Maintain good hydration. Keep follow up with urologist for recheck urine test and can discuss testosterone with urologist. Return to the clinic or go to the nearest emergency room if any of your symptoms worsen or new symptoms occur.

## 2012-06-19 NOTE — Patient Instructions (Signed)
Keep appointment with your cardiologist June 6th.  Discuss your fatigue over the past 6 months with Dr. Antoine Poche to make sure your heart rate is not contributing to these symptoms.   Maintain good hydration. Keep follow up with urologist for recheck urine test and can discuss testosterone with urologist. Return to the clinic or go to the nearest emergency room if any of your symptoms worsen or new symptoms occur.

## 2012-07-03 ENCOUNTER — Other Ambulatory Visit: Payer: Self-pay | Admitting: *Deleted

## 2012-07-03 NOTE — Telephone Encounter (Signed)
S/w sylvia, received approval for xarelto ( 20 mg ) daily till May 2015 approval #[id number]

## 2012-07-13 ENCOUNTER — Encounter: Payer: Self-pay | Admitting: Cardiology

## 2012-07-13 ENCOUNTER — Ambulatory Visit (INDEPENDENT_AMBULATORY_CARE_PROVIDER_SITE_OTHER): Payer: Medicare Other | Admitting: Cardiology

## 2012-07-13 ENCOUNTER — Ambulatory Visit (INDEPENDENT_AMBULATORY_CARE_PROVIDER_SITE_OTHER): Payer: Medicare Other | Admitting: *Deleted

## 2012-07-13 ENCOUNTER — Other Ambulatory Visit (INDEPENDENT_AMBULATORY_CARE_PROVIDER_SITE_OTHER): Payer: Medicare Other

## 2012-07-13 VITALS — BP 180/80 | HR 48 | Ht 72.0 in | Wt 182.4 lb

## 2012-07-13 DIAGNOSIS — I4891 Unspecified atrial fibrillation: Secondary | ICD-10-CM

## 2012-07-13 DIAGNOSIS — I442 Atrioventricular block, complete: Secondary | ICD-10-CM

## 2012-07-13 LAB — CBC WITH DIFFERENTIAL/PLATELET
Basophils Absolute: 0 10*3/uL (ref 0.0–0.1)
HCT: 40.8 % (ref 39.0–52.0)
Lymphocytes Relative: 36.2 % (ref 12.0–46.0)
Lymphs Abs: 2.5 10*3/uL (ref 0.7–4.0)
Monocytes Relative: 11 % (ref 3.0–12.0)
Neutrophils Relative %: 50.3 % (ref 43.0–77.0)
Platelets: 211 10*3/uL (ref 150.0–400.0)
RDW: 15.8 % — ABNORMAL HIGH (ref 11.5–14.6)
WBC: 6.9 10*3/uL (ref 4.5–10.5)

## 2012-07-13 LAB — BASIC METABOLIC PANEL
BUN: 27 mg/dL — ABNORMAL HIGH (ref 6–23)
Calcium: 9.1 mg/dL (ref 8.4–10.5)
Creatinine, Ser: 1.2 mg/dL (ref 0.4–1.5)
GFR: 64.4 mL/min (ref >60.00)
Glucose, Bld: 84 mg/dL (ref 70–99)

## 2012-07-13 NOTE — Patient Instructions (Signed)

## 2012-07-13 NOTE — Progress Notes (Signed)
HPI The patient presents for followup r non-Q wave myocardial infarction. He actually had a catheterization demonstrating no obstructive coronary disease.  He also has chronic CHB but has not required a pacemaker.  When he was last seen here he was noted to be in atrial fibrillation.  He was started on Xarelto.  At that time he had been complaining of fatigue but was also being managed for urinary infection. He says that he is recovered from this and feeling better.  He was able to hike yesterday.     Allergies  Allergen Reactions  . Doxycycline     Upset stomach    Current Outpatient Prescriptions  Medication Sig Dispense Refill  . B Complex-Biotin-FA (MULTI-B COMPLEX PO) Take by mouth daily.      . Biotin 10 MG TABS Take 10 mg by mouth daily.      Dewayne Shorter Seed OIL Take by mouth daily.      . cephALEXin (KEFLEX) 500 MG capsule Take 1 capsule (500 mg total) by mouth 3 (three) times daily.  21 capsule  0  . cholecalciferol (VITAMIN D) 1000 UNITS tablet Take 4,000 Units by mouth daily.       Marland Kitchen CINNAMON PO Take by mouth. 1/4 tsp every am      . co-enzyme Q-10 30 MG capsule Take 30 mg by mouth daily.      . Cranberry 400 MG CAPS Take by mouth daily.      Marland Kitchen lisinopril-hydrochlorothiazide (PRINZIDE,ZESTORETIC) 10-12.5 MG per tablet Take 0.5 tablets by mouth daily.      Marland Kitchen lisinopril-hydrochlorothiazide (PRINZIDE,ZESTORETIC) 10-12.5 MG per tablet TAKE 1 TABLET BY MOUTH DAILY.  30 tablet  1  . LUTEIN-ZEAXANTHIN PO Take 65 mg by mouth daily.       . magnesium oxide (MAG-OX) 400 MG tablet Take 400 mg by mouth daily.      Marland Kitchen OVER THE COUNTER MEDICATION Take 500 mg by mouth daily. Tumeric 500mg       . Probiotic Product (PROBIOTIC DAILY PO) Take 1 tablet by mouth daily.      Marland Kitchen Resveratrol 250 MG CAPS Take 250 mg by mouth daily.      . Rivaroxaban (XARELTO) 20 MG TABS Take 1 tablet (20 mg total) by mouth daily.  30 tablet  11  . tamsulosin (FLOMAX) 0.4 MG CAPS Take 1 capsule (0.4 mg total) by  mouth daily.  30 capsule  3  . TURMERIC PO Take by mouth daily.      . vitamin B-12 (CYANOCOBALAMIN) 1000 MCG tablet Take 1,000 mcg by mouth daily.      . vitamin C (ASCORBIC ACID) 500 MG tablet Take 500 mg by mouth daily.      . vitamin E 400 UNIT capsule Take 400 Units by mouth daily.       No current facility-administered medications for this visit.    Past Medical History  Diagnosis Date  . Hypertension   . Complete heart block   . NSTEMI (non-ST elevated myocardial infarction)   . Talar fracture     casted no surg  . CAD (coronary artery disease)     LHC 03/16/11: Mid LAD 10-20%, proximal circumflex 10%, mid RCA 20%, EF 55%.  . Atrial fibrillation   . Hx of echocardiogram     Echo 02/2011: EF 65-70%, normal wall motion, MAC, mild MR, mild LAE, mild RVE  . OSA (obstructive sleep apnea)     Past Surgical History  Procedure Laterality Date  .  Hemmorhoidectomy    . Colonoscopy      Repeated and normal in 2011  . Prostate biopsy      negative - [facility]    ROS:  As stated in the HPI and negative for all other systems.  PHYSICAL EXAM BP 180/80  Pulse 48  Ht 6' (1.829 m)  Wt 182 lb 6.4 oz (82.736 kg)  BMI 24.73 kg/m2 GENERAL:  Well appearing, in looking younger than stated age NECK:  No jugular venous distention, waveform within normal limits, carotid upstroke brisk and symmetric, no bruits, no thyromegaly LYMPHATICS:  No cervical, inguinal adenopathy LUNGS:  Clear to auscultation bilaterally CHEST:  Unremarkable HEART:  PMI not displaced or sustained,S1 and S2 within normal limits, no S3, no clicks, no rubs, no murmurs ABD:  Flat, positive bowel sounds normal in frequency in pitch, no bruits, no rebound, no guarding, no midline pulsatile mass, no hepatomegaly, no splenomegaly   ASSESSMENT AND PLAN   ATRIAL FIBRILLATION:  He tolerates this rhythm. He is tolerating anticoagulation. There is no plan for cardioversion as in the previous note.  COMPLETE HEART  BLOCK:  All of these years later he still has no indication for pacemaker.  HTN: his blood pressure is controlled at home with systolics in the 130 - 140 range.

## 2012-07-13 NOTE — Patient Instructions (Addendum)
The current medical regimen is effective;  continue present plan and medications.  Follow up in 6 months with Dr Hochrein.  You will receive a letter in the mail 2 months before you are due.  Please call us when you receive this letter to schedule your follow up appointment.  

## 2012-07-13 NOTE — Progress Notes (Signed)
Pt was started on Xarelto 20mg  for AFib on 06/09/12.    Reviewed patients medication list.  Pt is not  currently on any combined P-gp and strong CYP3A4 inhibitors/inducers (ketoconazole, traconazole, ritonavir, carbamazepine, phenytoin, rifampin, St. John's wort).  Reviewed labs.  SCr 1.2,   Weight  82.9 Kg,  CrCl- 54.69.   Dose appropriate  based on CrCl.   Hgb and HCT 13.8 & 40.8  A full discussion of the nature of anticoagulants has been carried out.  A benefit/risk analysis has been presented to the patient, so that they understand the justification for choosing anticoagulation with Xarelto at this time.  The need for compliance is stressed.  Pt is aware to take the medication once daily with the largest meal of the day.  Side effects of potential bleeding are discussed, including unusual colored urine or stools, coughing up blood or coffee ground emesis, nose bleeds or serious fall or head trauma.  Discussed signs and symptoms of stroke. The patient should avoid any OTC items containing aspirin or ibuprofen.  Avoid alcohol consumption.   Call if any signs of abnormal bleeding.  Discussed financial obligations and resolved any difficulty in obtaining medication.  Next lab test test in 6 months.

## 2012-08-06 ENCOUNTER — Other Ambulatory Visit: Payer: Self-pay | Admitting: Cardiology

## 2012-08-14 ENCOUNTER — Ambulatory Visit: Payer: Self-pay | Admitting: Pharmacist

## 2012-08-14 DIAGNOSIS — I4891 Unspecified atrial fibrillation: Secondary | ICD-10-CM

## 2012-08-20 ENCOUNTER — Other Ambulatory Visit (HOSPITAL_COMMUNITY): Payer: Self-pay | Admitting: Family Medicine

## 2012-08-21 ENCOUNTER — Ambulatory Visit: Payer: Medicare Other | Admitting: Family Medicine

## 2012-08-27 ENCOUNTER — Other Ambulatory Visit (HOSPITAL_COMMUNITY): Payer: Self-pay | Admitting: Family Medicine

## 2012-08-28 ENCOUNTER — Encounter: Payer: Self-pay | Admitting: Family Medicine

## 2012-08-28 ENCOUNTER — Ambulatory Visit (INDEPENDENT_AMBULATORY_CARE_PROVIDER_SITE_OTHER): Payer: Medicare Other | Admitting: Family Medicine

## 2012-08-28 VITALS — BP 148/80 | HR 39 | Temp 97.7°F | Resp 16 | Ht 71.5 in | Wt 182.0 lb

## 2012-08-28 DIAGNOSIS — I10 Essential (primary) hypertension: Secondary | ICD-10-CM

## 2012-08-28 DIAGNOSIS — R350 Frequency of micturition: Secondary | ICD-10-CM

## 2012-08-28 DIAGNOSIS — N400 Enlarged prostate without lower urinary tract symptoms: Secondary | ICD-10-CM | POA: Insufficient documentation

## 2012-08-28 LAB — POCT URINALYSIS DIPSTICK
Bilirubin, UA: NEGATIVE
Blood, UA: NEGATIVE
Glucose, UA: NEGATIVE
Ketones, UA: NEGATIVE
Leukocytes, UA: NEGATIVE
Nitrite, UA: NEGATIVE

## 2012-08-28 LAB — POCT UA - MICROSCOPIC ONLY: Yeast, UA: NEGATIVE

## 2012-08-28 MED ORDER — TAMSULOSIN HCL 0.4 MG PO CAPS: 0.4000 mg | ORAL_CAPSULE | Freq: Every day | ORAL | Status: DC

## 2012-08-28 NOTE — Progress Notes (Signed)
Subjective:    Patient ID: Chad Russell, male    DOB: 19-Jul-1926, 79 y.o.   MRN: [id number]  HPI Chad Russell is a 79 y.o. male  See prior ov's.  Hx recurrent UTI: Complicated history with hospitalization March 2014 E coli bacteremia/uti. discharged 04/29/12. - Creatinine elevated at 1.57 on admission, and hyponatremic, but Creatinine and sodium returned to baseline following IV fluids and improvement in PO intake.  Benign prostatic hyperplasia with lower urinary tract symptoms - Patient started on Flomax during admission.  Urinating better on flomax, and followed up with urology 06/13/12 - noted to have recurrent UTI - started on Keflex at their office. Last abx's in May for UTI - treated at urologist in May. Initially Flomax prescribed by urologist - Dr. Annabell Howells. Last seen June 6th.  Plan on repeat Nov 26th.  Ran out FLomax 2 days ago. Tried calling urologist for refill last week and yesterday, but has not received response. Did not take yesterday. Some frequency past 2 days. No fever. No abd pain. flomax usually allows less frequency and feels like bladder emptying better.  No change in balance with flomax.   Hx of bradycardia, complete heart block, hx of CAD.  Started on Xarelto in place of ASA. Cardiologist: Hochrein. ECG c/w AFib and underlying CHB at cardiologists office. recommended against DCCV due to his conduction system disease. Still taking Xarelto at night. No chest pains.  Has appt with cardiology this fall. Trouble with balance for years at times.  No recent changes.  Able to hike ok today. No dizziness/lightheadedness/fatigue.   HTN- Outside BP's: 123/67.  No new side effects with antihypertensives.    Results for orders placed in visit on 07/13/12  BASIC METABOLIC PANEL      Result Value Range   Sodium 140  135 - 145 mEq/L   Potassium 4.6  3.5 - 5.1 mEq/L   Chloride 105  96 - 112 mEq/L   CO2 27  19 - 32 mEq/L   Glucose, Bld 84  70 - 99 mg/dL   BUN 27 (*) 6 - 23 mg/dL    Creatinine, Ser 1.2  0.4 - 1.5 mg/dL   Calcium 9.1  8.4 - 11.9 mg/dL   GFR 14.78  >29.56 mL/min  CBC WITH DIFFERENTIAL      Result Value Range   WBC 6.9  4.5 - 10.5 K/uL   RBC 4.19 (*) 4.22 - 5.81 Mil/uL   Hemoglobin 13.8  13.0 - 17.0 g/dL   HCT 21.3  08.6 - 57.8 %   MCV 97.6  78.0 - 100.0 fl   MCHC 33.7  30.0 - 36.0 g/dL   RDW 46.9 (*) 62.9 - 52.8 %   Platelets 211.0  150.0 - 400.0 K/uL   Neutrophils Relative % 50.3  43.0 - 77.0 %   Lymphocytes Relative 36.2  12.0 - 46.0 %   Monocytes Relative 11.0  3.0 - 12.0 %   Eosinophils Relative 2.0  0.0 - 5.0 %   Basophils Relative 0.5  0.0 - 3.0 %   Neutro Abs 3.5  1.4 - 7.7 K/uL   Lymphs Abs 2.5  0.7 - 4.0 K/uL   Monocytes Absolute 0.8  0.1 - 1.0 K/uL   Eosinophils Absolute 0.1  0.0 - 0.7 K/uL   Basophils Absolute 0.0  0.0 - 0.1 K/uL   Review of Systems       Objective:   Physical Exam  Vitals reviewed. Constitutional: He is oriented to person, place, and  time. He appears well-developed and well-nourished.  HENT:  Head: Normocephalic and atraumatic.  Eyes: EOM are normal. Pupils are equal, round, and reactive to light.  Neck: No JVD present. Carotid bruit is not present.  Cardiovascular: Regular rhythm, normal heart sounds and normal pulses.  Bradycardia present.  PMI is not displaced.   No murmur heard. Pulmonary/Chest: Effort normal and breath sounds normal. He has no rales.  Abdominal: Normal appearance. He exhibits no distension. There is no tenderness. There is no rebound and no CVA tenderness.  Musculoskeletal: He exhibits no edema.  Neurological: He is alert and oriented to person, place, and time. He has normal strength. No sensory deficit. He displays a negative Romberg sign. Coordination and gait normal.  nonfocal   Skin: Skin is warm and dry.  Psychiatric: He has a normal mood and affect.    Results for orders placed in visit on 08/28/12  POCT UA - MICROSCOPIC ONLY      Result Value Range   WBC, Ur, HPF, POC  0-1     RBC, urine, microscopic 0-3     Bacteria, U Microscopic neg     Mucus, UA trace     Epithelial cells, urine per micros 0-2     Crystals, Ur, HPF, POC neg     Casts, Ur, LPF, POC neg     Yeast, UA neg    POCT URINALYSIS DIPSTICK      Result Value Range   Color, UA yellow     Clarity, UA clear     Glucose, UA neg     Bilirubin, UA neg     Ketones, UA neg     Spec Grav, UA 1.015     Blood, UA neg     pH, UA 6.0     Protein, UA neg     Urobilinogen, UA 0.2     Nitrite, UA neg     Leukocytes, UA Negative         Assessment & Plan:  Chad Russell is a 79 y.o. male BPH (benign prostatic hyperplasia) - Plan: POCT UA - Microscopic Only, POCT urinalysis dipstick, Urine culture, tamsulosin (FLOMAX) 0.4 MG CAPS  Urinary frequency - Plan: POCT UA - Microscopic Only, POCT urinalysis dipstick, Urine culture, tamsulosin (FLOMAX) 0.4 MG CAPS  Unspecified essential hypertension   HTN - stable.  No change in meds. Has hx of CHB, followed by cardiology, without recent change in symptoms.  To discuss allowable exercise with cardiology.   BPH - urinary frequency likely off flomax - restart flomax, check urine culture, but U/A does not appear infected at present.  rtc precautions. Follow up with urologist scheduled.   Hx of imbalance, without falls. Chronic problem.  Hx of CHB, but denies any new symptoms.   Can be discussed with cardiology at next ov. No apparent changes on flomax, but cautioned on anticholinergic effects., fall precautions, and to maintain hydration.  rtc precautions.  Meds ordered this encounter  . tamsulosin (FLOMAX) 0.4 MG CAPS    Sig: Take 1 capsule (0.4 mg total) by mouth daily.    Dispense:  90 capsule    Refill:  1    Patient Instructions  Continue follow up with your urologist and cardiologist as scheduled. Maintain adequate hydration.  Return to the clinic or go to the nearest emergency room if any of your symptoms worsen or new symptoms  occur.

## 2012-08-28 NOTE — Telephone Encounter (Signed)
Is his urologist prescribing this? Has he had recent follow up with urology?

## 2012-08-28 NOTE — Patient Instructions (Signed)
Continue follow up with your urologist and cardiologist as scheduled. Maintain adequate hydration.  Return to the clinic or go to the nearest emergency room if any of your symptoms worsen or new symptoms occur.

## 2012-08-28 NOTE — Telephone Encounter (Signed)
Will discuss at ov today

## 2012-08-30 LAB — URINE CULTURE: Organism ID, Bacteria: NO GROWTH

## 2012-09-29 ENCOUNTER — Encounter: Payer: Self-pay | Admitting: Radiology

## 2012-10-31 ENCOUNTER — Other Ambulatory Visit: Payer: Self-pay | Admitting: Cardiology

## 2012-10-31 DIAGNOSIS — I4891 Unspecified atrial fibrillation: Secondary | ICD-10-CM

## 2012-10-31 NOTE — Telephone Encounter (Signed)
PT AWARE  DR HOCHREIN  NOT  IN OFFICE THIS WEEK WILL  FORWARD TO DR HOCHREIN TO REVIEW  PER PT  WILL ONLY PICK UP 10 TABS   TO HOLD  HIM OVER  UNTIL  MESSAGE ADDRESSED .Zack Seal

## 2012-10-31 NOTE — Telephone Encounter (Signed)
New Problem   Xarelto regimen// 20 mg // believes that his legs muscle and joint pain may be the result of this high dosage of medication// wants to have it reduced to 10 mg// is due for a refill in 2 days and would like to switch to 10 mg//

## 2012-11-02 ENCOUNTER — Other Ambulatory Visit: Payer: Self-pay | Admitting: *Deleted

## 2012-11-02 DIAGNOSIS — I4891 Unspecified atrial fibrillation: Secondary | ICD-10-CM

## 2012-11-02 MED ORDER — RIVAROXABAN 20 MG PO TABS: 20.0000 mg | ORAL_TABLET | Freq: Every day | ORAL | Status: DC

## 2012-11-02 NOTE — Telephone Encounter (Signed)
Patient states he is having some leg cramps that he associates with xarelto, reviewed side effects and dosing of xarelto with patient, he states he knows not to stop this anticoagulant medication, he has an appointment to talk with Dr Antoine Poche on Tuesday 11/07/2012, he request a 10 day supply to go to his pharmacy

## 2012-11-02 NOTE — Telephone Encounter (Signed)
His therapeutic dose is 20 mg of Xarelto.  No data for 10 mg in this situation.  We could try a different NOAC such as Eliquis.

## 2012-11-03 NOTE — Telephone Encounter (Signed)
Left message for patient to call our office to discuss Dr. Jenene Slicker advice

## 2012-11-06 ENCOUNTER — Other Ambulatory Visit: Payer: Self-pay | Admitting: *Deleted

## 2012-11-06 DIAGNOSIS — I4891 Unspecified atrial fibrillation: Secondary | ICD-10-CM

## 2012-11-06 MED ORDER — RIVAROXABAN 20 MG PO TABS: 20.0000 mg | ORAL_TABLET | Freq: Every day | ORAL | Status: DC

## 2012-11-06 NOTE — Telephone Encounter (Signed)
PT AWARE WILL CONT TAKING THE  20 MG   OF XARELTO  AND  DISCUSS  WITH   DR [facility] AT NEXT VISIT./CY

## 2012-11-06 NOTE — Telephone Encounter (Signed)
Left message to call back  

## 2012-11-07 ENCOUNTER — Telehealth: Payer: Self-pay

## 2012-11-07 NOTE — Telephone Encounter (Signed)
Call patient to see if patient was taking 20 mg of xarelto or 10 mgs. he stated that he was taking 20 mg a day, I also made him aware that  Dr hochrein wanted him to sstay on the 20 mg of xarelto

## 2012-11-23 ENCOUNTER — Other Ambulatory Visit: Payer: Self-pay | Admitting: Family Medicine

## 2012-12-01 ENCOUNTER — Other Ambulatory Visit: Payer: Self-pay | Admitting: *Deleted

## 2012-12-01 DIAGNOSIS — I4891 Unspecified atrial fibrillation: Secondary | ICD-10-CM

## 2012-12-01 MED ORDER — RIVAROXABAN 20 MG PO TABS: 20.0000 mg | ORAL_TABLET | Freq: Every day | ORAL | Status: DC

## 2012-12-08 ENCOUNTER — Encounter: Payer: Self-pay | Admitting: Cardiology

## 2012-12-08 ENCOUNTER — Ambulatory Visit (INDEPENDENT_AMBULATORY_CARE_PROVIDER_SITE_OTHER): Payer: Medicare Other | Admitting: Cardiology

## 2012-12-08 VITALS — BP 186/92 | HR 37 | Ht 71.5 in | Wt 182.0 lb

## 2012-12-08 DIAGNOSIS — I214 Non-ST elevation (NSTEMI) myocardial infarction: Secondary | ICD-10-CM

## 2012-12-08 DIAGNOSIS — I4891 Unspecified atrial fibrillation: Secondary | ICD-10-CM

## 2012-12-08 DIAGNOSIS — I442 Atrioventricular block, complete: Secondary | ICD-10-CM

## 2012-12-08 DIAGNOSIS — I10 Essential (primary) hypertension: Secondary | ICD-10-CM

## 2012-12-08 MED ORDER — RIVAROXABAN 20 MG PO TABS: 20.0000 mg | ORAL_TABLET | Freq: Every day | ORAL | Status: DC

## 2012-12-08 NOTE — Patient Instructions (Signed)
The current medical regimen is effective;  continue present plan and medications.  Follow up in 6 months with Dr Hochrein.  You will receive a letter in the mail 2 months before you are due.  Please call us when you receive this letter to schedule your follow up appointment.  

## 2012-12-08 NOTE — Progress Notes (Signed)
HPI The patient presents for followup of [facility].   He has been in atrial fibrillation. He tolerates his complete heart block. Since I last saw him he has had no new cardiovascular complaints. He denies palpitations, presyncope or syncope. He has had no chest pressure, neck or arm discomfort. He remains active. He is tolerating his blood thinner. He's had no weight gain or edema. He has never required a pacemaker.   Allergies  Allergen Reactions  . Doxycycline     Upset stomach  . Keflex [Cephalexin]     Abdominal disruption    Current Outpatient Prescriptions  Medication Sig Dispense Refill  . B Complex-Biotin-FA (MULTI-B COMPLEX PO) Take by mouth daily.      . Biotin 10 MG TABS Take 10 mg by mouth daily.      Dewayne Shorter Seed OIL Take by mouth daily.      . cholecalciferol (VITAMIN D) 1000 UNITS tablet Take 4,000 Units by mouth daily.       Marland Kitchen CINNAMON PO Take by mouth. 1/4 tsp every am      . co-enzyme Q-10 30 MG capsule Take 30 mg by mouth daily.      . Cranberry 400 MG CAPS Take by mouth daily.      Marland Kitchen FLUZONE HIGH-DOSE injection       . lisinopril-hydrochlorothiazide (PRINZIDE,ZESTORETIC) 10-12.5 MG per tablet TAKE 1 TABLET BY MOUTH DAILY.  60 tablet  0  . LUTEIN-ZEAXANTHIN PO Take 65 mg by mouth daily.       . magnesium oxide (MAG-OX) 400 MG tablet Take 400 mg by mouth daily.      Marland Kitchen OVER THE COUNTER MEDICATION Take 500 mg by mouth daily. Tumeric 500mg       . Probiotic Product (PROBIOTIC DAILY PO) Take 1 tablet by mouth daily.      Marland Kitchen Resveratrol 250 MG CAPS Take 250 mg by mouth daily.      . Rivaroxaban (XARELTO) 20 MG TABS tablet Take 1 tablet (20 mg total) by mouth daily.  10 tablet  0  . tamsulosin (FLOMAX) 0.4 MG CAPS capsule TAKE 1 CAPSULE (0.4 MG TOTAL) BY MOUTH DAILY.  90 capsule  0  . TURMERIC PO Take by mouth daily.      . vitamin B-12 (CYANOCOBALAMIN) 1000 MCG tablet Take 1,000 mcg by mouth daily.      . vitamin C (ASCORBIC ACID) 500 MG tablet Take 500 mg by mouth daily.       . vitamin E 400 UNIT capsule Take 400 Units by mouth daily.       No current facility-administered medications for this visit.    Past Medical History  Diagnosis Date  . Hypertension   . Complete heart block   . NSTEMI (non-ST elevated myocardial infarction)   . Talar fracture     casted no surg  . CAD (coronary artery disease)     LHC 03/16/11: Mid LAD 10-20%, proximal circumflex 10%, mid RCA 20%, EF 55%.  . Atrial fibrillation   . Hx of echocardiogram     Echo 02/2011: EF 65-70%, normal wall motion, MAC, mild MR, mild LAE, mild RVE  . OSA (obstructive sleep apnea)   . BPH (benign prostatic hyperplasia) 2014    Urology Dr Annabell Howells with [facility] [phone]     Past Surgical History  Procedure Laterality Date  . Hemmorhoidectomy    . Colonoscopy      Repeated and normal in 2011  . Prostate biopsy  negative - [facility]    ROS:  As stated in the HPI and negative for all other systems.  PHYSICAL EXAM BP 186/92  Pulse 37  Ht 5' 11.5" (1.816 m)  Wt 182 lb (82.555 kg)  BMI 25.03 kg/m2 GENERAL:  Well appearing, in looking younger than stated age NECK:  No jugular venous distention, waveform within normal limits, carotid upstroke brisk and symmetric, no bruits, no thyromegaly LYMPHATICS:  No cervical, inguinal adenopathy LUNGS:  Clear to auscultation bilaterally CHEST:  Unremarkable HEART:  PMI not displaced or sustained,S1 and S2 within normal limits, no S3, no clicks, no rubs, no murmurs ABD:  Flat, positive bowel sounds normal in frequency in pitch, no bruits, no rebound, no guarding, no midline pulsatile mass, no hepatomegaly, no splenomegaly  EKG:   Atrial fibrillation with complete heart block, right bundle branch block  12/08/2012  ASSESSMENT AND PLAN   ATRIAL FIBRILLATION:  He tolerates this rhythm. He is tolerating anticoagulation. There is no plan for cardioversion.  He will remain on Xarelto and we discussed this at length.   COMPLETE HEART BLOCK:  All  of these years later he still has no indication for pacemaker.  HTN: his blood pressure is controlled at home with systolics in the 130 - 140 range.  NQWMI:    The patient has had no further symptoms. No further cardiovascular testing is suggested.

## 2012-12-25 ENCOUNTER — Telehealth: Payer: Self-pay | Admitting: Cardiology

## 2012-12-25 NOTE — Telephone Encounter (Signed)
Will forward information to Dr Antoine Poche

## 2012-12-25 NOTE — Telephone Encounter (Signed)
Up date     Pt's wife called and said this is a Ship broker to  Dr Antoine Poche.    Look at your e-mail about the [facility].     Thank you!

## 2013-01-10 ENCOUNTER — Telehealth: Payer: Self-pay | Admitting: Cardiology

## 2013-01-10 NOTE — Telephone Encounter (Signed)
New Problem:  Pt wants to know if he needs to come to his appt on the 11/24 with the coumadin clinic. Pt states he came in last month for his xarelto check. I informed the pt he saw Dr. Antoine Poche but I'm not seeing that he came into the coumadin clinic. Pt states he did. Please advise pt if he needs to come to his appt or if those levels have already been checked.

## 2013-01-10 NOTE — Telephone Encounter (Signed)
Spoke with pt.  He did see Dr. Antoine Poche in October but no labs have been done since May.  CrCl borderline at that time.  Pt okay with keeping appt next week to recheck bloodwork for Xarelto.

## 2013-01-15 ENCOUNTER — Ambulatory Visit (INDEPENDENT_AMBULATORY_CARE_PROVIDER_SITE_OTHER): Payer: Medicare Other | Admitting: *Deleted

## 2013-01-15 DIAGNOSIS — I4891 Unspecified atrial fibrillation: Secondary | ICD-10-CM

## 2013-01-15 LAB — BASIC METABOLIC PANEL
CO2: 28 mEq/L (ref 19–32)
Chloride: 104 mEq/L (ref 96–112)
Sodium: 139 mEq/L (ref 135–145)

## 2013-01-15 LAB — CBC
RDW: 14.2 % (ref 11.5–14.6)
WBC: 6.2 10*3/uL (ref 4.5–10.5)

## 2013-01-15 NOTE — Progress Notes (Signed)
Pt was started on Xarelto for Afib on 12/15/12.    Reviewed patients medication list.  Pt is not currently on any combined P-gp and strong CYP3A4 inhibitors/inducers (ketoconazole, traconazole, ritonavir, carbamazepine, phenytoin, rifampin, St. John's wort).  Reviewed labs.  SCr 1.1, Weight 83Kg, CrCl- 58.68  Dose appropriate based on CrCl.   Hgb and HCT 14.6 & 43.4 .  A full discussion of the nature of anticoagulants has been carried out.  A benefit/risk analysis has been presented to the patient, so that they understand the justification for choosing anticoagulation with Xarelto at this time.  The need for compliance is stressed.  Pt is aware to take the medication once daily with the largest meal of the day.  Side effects of potential bleeding are discussed, including unusual colored urine or stools, coughing up blood or coffee ground emesis, nose bleeds or serious fall or head trauma.  Discussed signs and symptoms of stroke. The patient should avoid any OTC items containing aspirin or ibuprofen.  Avoid alcohol consumption.   Call if any signs of abnormal bleeding.  Discussed financial obligations and resolved any difficulty in obtaining medication.  Next lab test test in 6 months.

## 2013-01-15 NOTE — Patient Instructions (Signed)

## 2013-03-13 ENCOUNTER — Other Ambulatory Visit: Payer: Self-pay | Admitting: Cardiology

## 2013-06-21 ENCOUNTER — Ambulatory Visit: Payer: Medicare Other | Admitting: Cardiology

## 2013-07-09 ENCOUNTER — Ambulatory Visit (INDEPENDENT_AMBULATORY_CARE_PROVIDER_SITE_OTHER): Payer: Medicare Other | Admitting: *Deleted

## 2013-07-09 ENCOUNTER — Telehealth: Payer: Self-pay | Admitting: Cardiology

## 2013-07-09 ENCOUNTER — Other Ambulatory Visit: Payer: Self-pay | Admitting: Cardiology

## 2013-07-09 DIAGNOSIS — I4891 Unspecified atrial fibrillation: Secondary | ICD-10-CM

## 2013-07-09 LAB — BASIC METABOLIC PANEL
BUN: 24 mg/dL — ABNORMAL HIGH (ref 6–23)
CHLORIDE: 106 meq/L (ref 96–112)
CO2: 29 meq/L (ref 19–32)
CREATININE: 1.1 mg/dL (ref 0.4–1.5)
Calcium: 8.9 mg/dL (ref 8.4–10.5)
GFR: 65.56 mL/min (ref >60.00)
Glucose, Bld: 89 mg/dL (ref 70–99)
POTASSIUM: 4 meq/L (ref 3.5–5.1)
SODIUM: 142 meq/L (ref 135–145)

## 2013-07-09 LAB — CBC
HCT: 43.2 % (ref 39.0–52.0)
Hemoglobin: 14.2 g/dL (ref 13.0–17.0)
MCHC: 32.9 g/dL (ref 30.0–36.0)
MCV: 102.6 fl — ABNORMAL HIGH (ref 78.0–100.0)
Platelets: 222 10*3/uL (ref 150.0–400.0)
RBC: 4.21 Mil/uL — ABNORMAL LOW (ref 4.22–5.81)
RDW: 13.8 % (ref 11.5–15.5)
WBC: 7.6 10*3/uL (ref 4.0–10.5)

## 2013-07-09 NOTE — Telephone Encounter (Signed)
Patient is returning your call. Please call back.  °

## 2013-07-09 NOTE — Progress Notes (Signed)
Pt was started on Xarelto for Afib on 12/15/12.    Reviewed patients medication list.  Pt is not currently on any combined P-gp and strong CYP3A4 inhibitors/inducers (ketoconazole, traconazole, ritonavir, carbamazepine, phenytoin, rifampin, St. John's wort).  Reviewed labs.  SCr 1.1, Weight 84.45Kg , CrCl- 59.71.  Dose appropriate based on CrCl.   Hgb and HCT 14.2/43.2.   A full discussion of the nature of anticoagulants has been carried out.  A benefit/risk analysis has been presented to the patient, so that they understand the justification for choosing anticoagulation with Xarelto at this time.  The need for compliance is stressed.  Pt is aware to take the medication once daily with the largest meal of the day.  Side effects of potential bleeding are discussed, including unusual colored urine or stools, coughing up blood or coffee ground emesis, nose bleeds or serious fall or head trauma.  Discussed signs and symptoms of stroke. The patient should avoid any OTC items containing aspirin or ibuprofen.  Avoid alcohol consumption.   Call if any signs of abnormal bleeding.  Discussed financial obligations and resolved any difficulty in obtaining medication.  Next lab test test in 6 months on 01/14/14.

## 2013-07-19 ENCOUNTER — Ambulatory Visit (INDEPENDENT_AMBULATORY_CARE_PROVIDER_SITE_OTHER): Payer: Medicare Other | Admitting: Cardiology

## 2013-07-19 VITALS — BP 170/80 | HR 38 | Ht 71.5 in | Wt 196.8 lb

## 2013-07-19 DIAGNOSIS — I442 Atrioventricular block, complete: Secondary | ICD-10-CM

## 2013-07-19 DIAGNOSIS — I214 Non-ST elevation (NSTEMI) myocardial infarction: Secondary | ICD-10-CM

## 2013-07-19 DIAGNOSIS — I4891 Unspecified atrial fibrillation: Secondary | ICD-10-CM

## 2013-07-19 DIAGNOSIS — I10 Essential (primary) hypertension: Secondary | ICD-10-CM

## 2013-07-19 NOTE — Progress Notes (Signed)
HPI The patient presents for followup of CHB.   He has been in atrial fibrillation. He tolerates his complete heart block. Since I last saw him he has had no new cardiovascular complaints. He denies palpitations, presyncope or syncope. He has had no chest pressure, neck or arm discomfort. He remains active. He is tolerating his blood thinner. He's had no weight gain or edema. He has never required a pacemaker.   Allergies  Allergen Reactions  . Doxycycline     Upset stomach  . Keflex [Cephalexin]     Abdominal disruption    Current Outpatient Prescriptions  Medication Sig Dispense Refill  . B Complex-Biotin-FA (MULTI-B COMPLEX PO) Take by mouth daily.      . Biotin 10 MG TABS Take 10 mg by mouth daily.      Antoine Poche Seed OIL Take by mouth daily.      . cholecalciferol (VITAMIN D) 1000 UNITS tablet Take 4,000 Units by mouth daily.       Marland Kitchen CINNAMON PO Take by mouth. 1/4 tsp every am      . co-enzyme Q-10 30 MG capsule Take 30 mg by mouth daily.      . Cranberry 400 MG CAPS Take by mouth daily.      Marland Kitchen FLUZONE HIGH-DOSE injection       . lisinopril-hydrochlorothiazide (PRINZIDE,ZESTORETIC) 10-12.5 MG per tablet TAKE 1 TABLET BY MOUTH DAILY.  30 tablet  0  . LUTEIN-ZEAXANTHIN PO Take 65 mg by mouth daily.       . magnesium oxide (MAG-OX) 400 MG tablet Take 400 mg by mouth daily.      Marland Kitchen OVER THE COUNTER MEDICATION Take 500 mg by mouth daily. Tumeric 500mg       . Probiotic Product (PROBIOTIC DAILY PO) Take 1 tablet by mouth daily.      Marland Kitchen Resveratrol 250 MG CAPS Take 250 mg by mouth daily.      . Rivaroxaban (XARELTO) 20 MG TABS tablet Take 1 tablet (20 mg total) by mouth daily.  90 tablet  3  . saw palmetto 160 MG capsule Take 160 mg by mouth 2 (two) times daily.      . tamsulosin (FLOMAX) 0.4 MG CAPS capsule TAKE 1 CAPSULE (0.4 MG TOTAL) BY MOUTH DAILY.  90 capsule  0  . TURMERIC PO Take by mouth daily.      . vitamin B-12 (CYANOCOBALAMIN) 1000 MCG tablet Take 1,000 mcg by mouth  daily.      . vitamin C (ASCORBIC ACID) 500 MG tablet Take 500 mg by mouth daily.      . vitamin E 400 UNIT capsule Take 400 Units by mouth daily.       No current facility-administered medications for this visit.    Past Medical History  Diagnosis Date  . Hypertension   . Complete heart block   . NSTEMI (non-ST elevated myocardial infarction)   . Talar fracture     casted no surg  . CAD (coronary artery disease)     LHC 03/16/11: Mid LAD 10-20%, proximal circumflex 10%, mid RCA 20%, EF 55%.  . Atrial fibrillation   . Hx of echocardiogram     Echo 02/2011: EF 65-70%, normal wall motion, MAC, mild MR, mild LAE, mild RVE  . OSA (obstructive sleep apnea)   . BPH (benign prostatic hyperplasia) 2014    Urology Dr [name] with [facility] [phone]     Past Surgical History  Procedure Laterality Date  . Hemmorhoidectomy    .  Colonoscopy      Repeated and normal in 2011  . Prostate biopsy      negative - [facility]    ROS:  As stated in the HPI and negative for all other systems.  PHYSICAL EXAM BP 170/80  Pulse 38  Ht 5' 11.5" (1.816 m)  Wt 196 lb 12.8 oz (89.268 kg)  BMI 27.07 kg/m2 GENERAL:  Well appearing, in looking younger than stated age NECK:  No jugular venous distention, waveform within normal limits, carotid upstroke brisk and symmetric, no bruits, no thyromegaly LYMPHATICS:  No cervical, inguinal adenopathy LUNGS:  Clear to auscultation bilaterally CHEST:  Unremarkable HEART:  PMI not displaced or sustained,S1 and S2 within normal limits, no S3, no clicks, no rubs, no murmurs ABD:  Flat, positive bowel sounds normal in frequency in pitch, no bruits, no rebound, no guarding, no midline pulsatile mass, no hepatomegaly, no splenomegaly  EKG:   Atrial fibrillation with complete heart block, right bundle branch block, junctional escape rhythm rate 38.   07/19/2013  ASSESSMENT AND PLAN   ATRIAL FIBRILLATION:  He tolerates this rhythm. He is tolerating anticoagulation.  There is no plan for cardioversion.  He will remain on Xarelto.  COMPLETE HEART BLOCK:  All of these years later he still has no indication for pacemaker.  HTN: his blood pressure is controlled at home with systolics in the 287 - 681 range.  NQWMI:    The patient has had no further symptoms. No further cardiovascular testing is suggested.

## 2013-07-19 NOTE — Patient Instructions (Signed)
The current medical regimen is effective;  continue present plan and medications.  Follow up in 6 months with Dr Hochrein.  You will receive a letter in the mail 2 months before you are due.  Please call us when you receive this letter to schedule your follow up appointment.  

## 2013-07-20 ENCOUNTER — Ambulatory Visit: Payer: Medicare Other | Admitting: Cardiology

## 2013-07-20 ENCOUNTER — Encounter: Payer: Self-pay | Admitting: Cardiology

## 2013-08-26 ENCOUNTER — Other Ambulatory Visit: Payer: Self-pay | Admitting: Cardiology

## 2013-10-02 ENCOUNTER — Other Ambulatory Visit: Payer: Self-pay | Admitting: Cardiology

## 2013-10-26 ENCOUNTER — Ambulatory Visit (INDEPENDENT_AMBULATORY_CARE_PROVIDER_SITE_OTHER): Payer: Medicare Other

## 2013-10-26 ENCOUNTER — Ambulatory Visit (INDEPENDENT_AMBULATORY_CARE_PROVIDER_SITE_OTHER): Payer: Medicare Other | Admitting: Family Medicine

## 2013-10-26 VITALS — BP 150/80 | HR 35 | Temp 97.5°F | Resp 18 | Ht 71.5 in | Wt 184.0 lb

## 2013-10-26 DIAGNOSIS — R2689 Other abnormalities of gait and mobility: Secondary | ICD-10-CM

## 2013-10-26 DIAGNOSIS — K5900 Constipation, unspecified: Secondary | ICD-10-CM

## 2013-10-26 DIAGNOSIS — R1011 Right upper quadrant pain: Secondary | ICD-10-CM

## 2013-10-26 DIAGNOSIS — R29818 Other symptoms and signs involving the nervous system: Secondary | ICD-10-CM

## 2013-10-26 DIAGNOSIS — I442 Atrioventricular block, complete: Secondary | ICD-10-CM

## 2013-10-26 LAB — COMPLETE METABOLIC PANEL WITH GFR
ALT: 17 U/L (ref 0–53)
AST: 20 U/L (ref 0–37)
Albumin: 4.7 g/dL (ref 3.5–5.2)
Alkaline Phosphatase: 50 U/L (ref 39–117)
BUN: 22 mg/dL (ref 6–23)
CHLORIDE: 102 meq/L (ref 96–112)
CO2: 29 meq/L (ref 19–32)
CREATININE: 1.09 mg/dL (ref 0.50–1.35)
Calcium: 9.7 mg/dL (ref 8.4–10.5)
GFR, EST AFRICAN AMERICAN: 71 mL/min
GFR, EST NON AFRICAN AMERICAN: 62 mL/min
GLUCOSE: 99 mg/dL (ref 70–99)
Potassium: 4.6 mEq/L (ref 3.5–5.3)
Sodium: 139 mEq/L (ref 135–145)
Total Bilirubin: 1.2 mg/dL (ref 0.2–1.2)
Total Protein: 7 g/dL (ref 6.0–8.3)

## 2013-10-26 LAB — POCT CBC
Granulocyte percent: 53 %G (ref 37–80)
HEMATOCRIT: 48.1 % (ref 43.5–53.7)
HEMOGLOBIN: 15.9 g/dL (ref 14.1–18.1)
LYMPH, POC: 2.9 (ref 0.6–3.4)
MCH, POC: 33.7 pg — AB (ref 27–31.2)
MCHC: 33.2 g/dL (ref 31.8–35.4)
MCV: 101.7 fL — AB (ref 80–97)
MID (cbc): 0.6 (ref 0–0.9)
MPV: 7.9 fL (ref 0–99.8)
POC GRANULOCYTE: 4 (ref 2–6.9)
POC LYMPH PERCENT: 38.5 %L (ref 10–50)
POC MID %: 8.5 %M (ref 0–12)
Platelet Count, POC: 212 10*3/uL (ref 142–424)
RBC: 4.73 M/uL (ref 4.69–6.13)
RDW, POC: 14.3 %
WBC: 7.5 10*3/uL (ref 4.6–10.2)

## 2013-10-26 LAB — TSH: TSH: 2.857 u[IU]/mL (ref 0.350–4.500)

## 2013-10-26 NOTE — Progress Notes (Signed)
Subjective:    Patient ID: Chad Russell, male    DOB: 06-05-1926, 80 y.o.   MRN: [id number]  This chart was scribed for [name], MD by Rosary Lively, ED scribe. This patient was seen in room Room/bed 12 and the patient's care was started at 10:25 AM.  PCP: [name], MD  Chief Complaint  Patient presents with  . Abdominal Pain    RLQ x 1 month  . Balance issues    ongoing prolem     HPI Chad Russell is a 80 y.o. male with a h/o multiple medical problems including complete heart block, h/o MI, HTN, Atrial fibrillation, most recently seen by Cardiology May 25th. HR: 38 BP: 170/80, but pt was asymptomatic at that time. Pt takes Xarelto QD for Afib. Pt has not required a pace maker in the past. Based on that visit no changes in medication as he was tolerating anti-coag, and asymptomatic from his heart block. Last seen by me in 08/2012. He did note at visit difficulty with balance at times for years without recent changes at that time. Pt reports that his balance has  degenerated slowly at current visit. Pt describes balance as unsteady, and is unable to maintain balance when moving slow or static. Pt reports that moving from a sit to stand position, his balance is normal.  Pt reports that he skis, but has never fallen. Pt has not seen a neurologist for issue. Pt reports that this may be due to lack of exercise. Pt denies weakness. He was taking Flomax, and discussed anticholinergic, but also encouraged to discuss balance issues with cardiologist. Pt reports that he takes Flomax daily.   Abdominal Pain: Pt describes that pain is in right quadrant, and rates pain as a 2 or 3 out of 10, onset 1 month ago. Pt denies swelling. Pt denies fever, nausea, diarrhea or vomiting. Pt reports that he has 2 BM a day, however he does have hard stool, and takes stool softener each day. Pt has not taken any medication to modify discomfort.   Mental Capacity: Pt denies change in memory  issues, and reports that he does [place].  Colonoscopy reportedly normal 6 years ago.   Patient Active Problem List   Diagnosis Date Noted  . BPH (benign prostatic hyperplasia) 08/28/2012  . Atrial fibrillation 06/08/2012  . Bacteremia 04/28/2012  . Hyperplasia of prostate with lower urinary tract symptoms (LUTS) 04/28/2012  . Calf pain 04/28/2012  . Moderate malnutrition 04/28/2012  . Influenza-like illness 04/27/2012  . Infection of urinary tract - recurrent 04/26/2012  . NSTEMI (non-ST elevated myocardial infarction) 03/15/2011  . Complete heart block 03/15/2011  . HTN (hypertension) 03/15/2011   Past Medical History  Diagnosis Date  . Hypertension   . Complete heart block   . NSTEMI (non-ST elevated myocardial infarction)   . Talar fracture     casted no surg  . CAD (coronary artery disease)     LHC 03/16/11: Mid LAD 10-20%, proximal circumflex 10%, mid RCA 20%, EF 55%.  . Atrial fibrillation   . Hx of echocardiogram     Echo 02/2011: EF 65-70%, normal wall motion, MAC, mild MR, mild LAE, mild RVE  . OSA (obstructive sleep apnea)   . BPH (benign prostatic hyperplasia) 2014    Urology Dr [name] with [facility] [phone]    Past Surgical History  Procedure Laterality Date  . Hemmorhoidectomy    . Colonoscopy      Repeated and normal in 2011  .  Prostate biopsy      negative - [facility]   Allergies  Allergen Reactions  . Doxycycline     Upset stomach  . Keflex [Cephalexin]     Abdominal disruption   Prior to Admission medications   Medication Sig Start Date End Date Taking? Authorizing Provider  B Complex-Biotin-FA (MULTI-B COMPLEX PO) Take by mouth daily.   Yes Historical Provider, MD  Biotin 10 MG TABS Take 10 mg by mouth daily.   Yes Historical Provider, MD  Celery Seed OIL Take by mouth daily.   Yes Historical Provider, MD  cholecalciferol (VITAMIN D) 1000 UNITS tablet Take 4,000 Units by mouth daily.    Yes Historical Provider, MD  CINNAMON PO Take by  mouth. 1/4 tsp every am   Yes Historical Provider, MD  co-enzyme Q-10 30 MG capsule Take 30 mg by mouth daily.   Yes Historical Provider, MD  Cranberry 400 MG CAPS Take by mouth daily.   Yes Historical Provider, MD  FLUZONE HIGH-DOSE injection  11/02/12  Yes Historical Provider, MD  lisinopril-hydrochlorothiazide (PRINZIDE,ZESTORETIC) 10-12.5 MG per tablet TAKE 1 TABLET BY MOUTH DAILY.   Yes Minus Breeding, MD  magnesium oxide (MAG-OX) 400 MG tablet Take 400 mg by mouth daily.   Yes Historical Provider, MD  OVER THE COUNTER MEDICATION Take 500 mg by mouth daily. Tumeric 500mg    Yes Historical Provider, MD  Probiotic Product (PROBIOTIC DAILY PO) Take 1 tablet by mouth daily.   Yes Historical Provider, MD  Resveratrol 250 MG CAPS Take 250 mg by mouth daily.   Yes Historical Provider, MD  saw palmetto 160 MG capsule Take 160 mg by mouth 2 (two) times daily.   Yes Historical Provider, MD  tamsulosin (FLOMAX) 0.4 MG CAPS capsule TAKE 1 CAPSULE (0.4 MG TOTAL) BY MOUTH DAILY. 11/23/12  Yes [name], MD  TURMERIC PO Take by mouth daily.   Yes Historical Provider, MD  vitamin B-12 (CYANOCOBALAMIN) 1000 MCG tablet Take 1,000 mcg by mouth daily.   Yes Historical Provider, MD  vitamin C (ASCORBIC ACID) 500 MG tablet Take 500 mg by mouth daily.   Yes Historical Provider, MD  vitamin E 400 UNIT capsule Take 400 Units by mouth daily.   Yes Historical Provider, MD  XARELTO 20 MG TABS tablet TAKE 1 TABLET BY MOUTH DAILY 10/04/13  Yes Minus Breeding, MD  LUTEIN-ZEAXANTHIN PO Take 65 mg by mouth daily.     Historical Provider, MD   History   Social History  . Marital Status: Married    Spouse Name: N/A    Number of Children: 0  . Years of Education: N/A   Occupational History  . 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 Smoking status: Former Smoker -- 1.00 packs/day for 3 years    Types: Cigarettes    Quit date: 03/17/1955  . Smokeless tobacco: Not on file  . Alcohol  Use: 1.2 oz/week    2 Glasses of wine per week     Comment: daily  . Drug Use: No  . Sexual Activity: No   Other Topics Concern  . 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  Eyes:       No hearing difficulty,  or recent changes  Gastrointestinal: Positive for constipation. Negative for nausea, vomiting and diarrhea.  Musculoskeletal: Negative for joint swelling.      Objective:   Physical Exam  Nursing note and vitals reviewed. Constitutional: He is oriented to person, place, and time. He appears well-developed and well-nourished.  HENT:  Head: Normocephalic and atraumatic.  Eyes: EOM are normal. Pupils are equal, round, and reactive to light.  No nystagmus.  Neck: Normal range of motion. Neck supple. No JVD present. Carotid bruit is not present.  Cardiovascular: Normal heart sounds.  Bradycardia present.   No murmur heard. HR: approximately 40, but regular  Pulmonary/Chest: Effort normal and breath sounds normal. He has no rales.  Abdominal: Bowel sounds are normal. There is tenderness in the right upper quadrant. There is no CVA tenderness. No hernia (No hernia palpated).  Musculoskeletal: Normal range of motion. He exhibits no edema.  Neurological: He is alert and oriented to person, place, and time. He displays a negative Romberg sign.  No pronator drift. Normal finger to nose. Unsteady with heel to toe, but able to accomplish.   Skin: Skin is warm and dry.  Psychiatric: He has a normal mood and affect. His behavior is normal.   Results for orders placed in visit on 10/26/13  POCT CBC      Result Value Ref Range   WBC 7.5  4.6 - 10.2 K/uL   Lymph, poc 2.9  0.6 - 3.4   POC LYMPH PERCENT 38.5  10 - 50 %L   MID (cbc) 0.6  0 - 0.9   POC MID % 8.5  0 - 12 %M   POC Granulocyte  4.0  2 - 6.9   Granulocyte percent 53.0  37 - 80 %G   RBC 4.73  4.69 - 6.13 M/uL   Hemoglobin 15.9  14.1 - 18.1 g/dL   HCT, POC 48.1  43.5 - 53.7 %   MCV 101.7 (*) 80 - 97 fL   MCH, POC 33.7 (*) 27 - 31.2 pg   MCHC 33.2  31.8 - 35.4 g/dL   RDW, POC 14.3     Platelet Count, POC 212  142 - 424 K/uL   MPV 7.9  0 - 99.8 fL   [facility] reading (PRIMARY) by Dr. Carlota Raspberry Abdominal Series: Increased stool throughout without specific bowel gas findings. On CXR multiple areas of scarring without apparent change from prior study.   Filed Vitals:   10/26/13 0919  BP: 150/80  Pulse: 35  Temp: 97.5 F (36.4 C)  TempSrc: Oral  Resp: 18  Height: 5' 11.5" (1.816 m)  Weight: 184 lb (83.462 kg)  SpO2: 99%      Assessment & Plan:  Chad Russell is a 80 y.o. male Abdominal pain, right upper quadrant - Plan: TSH, COMPLETE METABOLIC PANEL WITH GFR, DG Abd Acute W/Chest, POCT CBC Unspecified constipation - Plan: TSH, COMPLETE METABOLIC PANEL WITH GFR, DG Abd Acute W/Chest, POCT CBC   -suspected constipation cause vs abd wall/muscular cause, no hernia appreciated. CBC reassuring.  CMP pending.  Trial of stool softener, miralax if needed, other tx below. rtc precautions.   Balance problem - Plan: TSH, COMPLETE METABOLIC PANEL WITH GFR, DG Abd Acute W/Chest, POCT CBC, Ambulatory referral to Neurology, CT Head Wo Contrast, Complete heart block - Plan: TSH, COMPLETE METABOLIC PANEL WITH GFR, DG Abd Acute W/Chest, POCT CBC  -no focal weakness on exam. Balance issue with slower movements, no apparent tremor not dizzy/lightheaded or chest sx's. Hx of  heart block, and bradycardic with this, but this is not new symptom, and stable form cardiac standpoint when seen last OV.   -possible anticholinergic effect of Flomax as discussed prior, but will check TSH, lytes, CT head and refer to neuro for eval.  Advised to call cardiologist to discuss sx's as well to make sure they do not think this is related to his CHB.    -ER/911/RTC precautions discussed.     No orders of the defined types were placed in this encounter.   Patient Instructions  Your abdominal symptoms may be due in part to constipation.  You should receive a call or letter about your lab results within the next week to 10 days. Colace as stool softener over the counter. Miralax once if no bowel movement for 2 days. Increase fluids and fiber. See other information below. If this is not helping your abdominal pain in the next week to 10 days, we may need to look into other causes - return to discuss. Return to the clinic or go to the nearest emergency room if any of your symptoms worsen or new symptoms occur.  I will refer you to a neurologist for evaluation of the dizziness, and cat scan of the head to be scheduled.  The flomax may contribute to this as well as your heart block, so I would discuss these symptoms with Dr. Percival Spanish to make sure he does not think these are related to your heart condition. Return to the clinic or go to the nearest emergency room if any of your symptoms worsen or new symptoms occur.  Constipation Constipation is when a person has fewer than three bowel movements a week, has difficulty having a bowel movement, or has stools that are dry, hard, or larger than normal. As people grow older, constipation is more common. If you try to fix constipation with medicines that make you have a bowel movement (laxatives), the problem may get worse. Long-term laxative use may cause the muscles of the colon to become weak. A low-fiber diet, not taking in enough fluids, and taking certain medicines may make constipation worse.  CAUSES   Certain medicines, such as antidepressants, pain medicine, iron supplements, antacids, and water pills.   Certain diseases, such as diabetes, irritable bowel syndrome (IBS), thyroid disease, or depression.   Not drinking enough water.   Not eating enough fiber-rich foods.   Stress or travel.    Lack of physical activity or exercise.   Ignoring the urge to have a bowel movement.   Using laxatives too much.  SIGNS AND SYMPTOMS   Having fewer than three bowel movements a week.   Straining to have a bowel movement.   Having stools that are hard, dry, or larger than normal.   Feeling full or bloated.   Pain in the lower abdomen.   Not feeling relief after having a bowel movement.  DIAGNOSIS  Your health care provider will take a medical history and perform a physical exam. Further testing may be done for severe constipation. Some tests may include:  A barium enema X-ray to examine your rectum, colon, and, sometimes, your small intestine.   A sigmoidoscopy to examine your lower colon.   A colonoscopy to examine your entire colon. TREATMENT  Treatment will depend on the severity of your constipation and what is causing it. Some dietary treatments include drinking more fluids and eating more fiber-rich foods. Lifestyle treatments may include regular exercise. If these diet and lifestyle recommendations do not help,  your health care provider may recommend taking over-the-counter laxative medicines to help you have bowel movements. Prescription medicines may be prescribed if over-the-counter medicines do not work.  HOME CARE INSTRUCTIONS   Eat foods that have a lot of fiber, such as fruits, vegetables, whole grains, and beans.  Limit foods high in fat and processed sugars, such as french fries, hamburgers, cookies, candies, and soda.   A fiber supplement may be added to your diet if you cannot get enough fiber from foods.   Drink enough fluids to keep your urine clear or pale yellow.   Exercise regularly or as directed by your health care provider.   Go to the restroom when you have the urge to go. Do not hold it.   Only take over-the-counter or prescription medicines as directed by your health care provider. Do not take other medicines for constipation  without talking to your health care provider first.  [place] IF:   You have bright red blood in your stool.   Your constipation lasts for more than 4 days or gets worse.   You have abdominal or rectal pain.   You have thin, pencil-like stools.   You have unexplained weight loss. MAKE SURE YOU:   Understand these instructions.  Will watch your condition.  Will get help right away if you are not doing well or get worse. Document Released: 11/07/2003 Document Revised: 02/13/2013 Document Reviewed: 11/20/2012 [facility] Patient Information 2015 [place], [place]. This information is not intended to replace advice given to you by your health care provider. Make sure you discuss any questions you have with your health care provider.     I personally performed the services described in this documentation, which was scribed in my presence. The recorded information has been reviewed and considered, and addended by me as needed.

## 2013-10-26 NOTE — Patient Instructions (Addendum)
Your abdominal symptoms may be due in part to constipation.  You should receive a call or letter about your lab results within the next week to 10 days. Colace as stool softener over the counter. Miralax once if no bowel movement for 2 days. Increase fluids and fiber. See other information below. If this is not helping your abdominal pain in the next week to 10 days, we may need to look into other causes - return to discuss. Return to the clinic or go to the nearest emergency room if any of your symptoms worsen or new symptoms occur.  I will refer you to a neurologist for evaluation of the dizziness, and cat scan of the head to be scheduled.  The flomax may contribute to this as well as your heart block, so I would discuss these symptoms with Dr. Percival Spanish to make sure he does not think these are related to your heart condition. Return to the clinic or go to the nearest emergency room if any of your symptoms worsen or new symptoms occur.  Constipation Constipation is when a person has fewer than three bowel movements a week, has difficulty having a bowel movement, or has stools that are dry, hard, or larger than normal. As people grow older, constipation is more common. If you try to fix constipation with medicines that make you have a bowel movement (laxatives), the problem may get worse. Long-term laxative use may cause the muscles of the colon to become weak. A low-fiber diet, not taking in enough fluids, and taking certain medicines may make constipation worse.  CAUSES   Certain medicines, such as antidepressants, pain medicine, iron supplements, antacids, and water pills.   Certain diseases, such as diabetes, irritable bowel syndrome (IBS), thyroid disease, or depression.   Not drinking enough water.   Not eating enough fiber-rich foods.   Stress or travel.   Lack of physical activity or exercise.   Ignoring the urge to have a bowel movement.   Using laxatives too much.  SIGNS AND  SYMPTOMS   Having fewer than three bowel movements a week.   Straining to have a bowel movement.   Having stools that are hard, dry, or larger than normal.   Feeling full or bloated.   Pain in the lower abdomen.   Not feeling relief after having a bowel movement.  DIAGNOSIS  Your health care provider will take a medical history and perform a physical exam. Further testing may be done for severe constipation. Some tests may include:  A barium enema X-ray to examine your rectum, colon, and, sometimes, your small intestine.   A sigmoidoscopy to examine your lower colon.   A colonoscopy to examine your entire colon. TREATMENT  Treatment will depend on the severity of your constipation and what is causing it. Some dietary treatments include drinking more fluids and eating more fiber-rich foods. Lifestyle treatments may include regular exercise. If these diet and lifestyle recommendations do not help, your health care provider may recommend taking over-the-counter laxative medicines to help you have bowel movements. Prescription medicines may be prescribed if over-the-counter medicines do not work.  HOME CARE INSTRUCTIONS   Eat foods that have a lot of fiber, such as fruits, vegetables, whole grains, and beans.  Limit foods high in fat and processed sugars, such as french fries, hamburgers, cookies, candies, and soda.   A fiber supplement may be added to your diet if you cannot get enough fiber from foods.   Drink enough fluids to keep  your urine clear or pale yellow.   Exercise regularly or as directed by your health care provider.   Go to the restroom when you have the urge to go. Do not hold it.   Only take over-the-counter or prescription medicines as directed by your health care provider. Do not take other medicines for constipation without talking to your health care provider first.  [place] IF:   You have bright red blood in your stool.    Your constipation lasts for more than 4 days or gets worse.   You have abdominal or rectal pain.   You have thin, pencil-like stools.   You have unexplained weight loss. MAKE SURE YOU:   Understand these instructions.  Will watch your condition.  Will get help right away if you are not doing well or get worse. Document Released: 11/07/2003 Document Revised: 02/13/2013 Document Reviewed: 11/20/2012 [facility] Patient Information 2015 [place], [place]. This information is not intended to replace advice given to you by your health care provider. Make sure you discuss any questions you have with your health care provider.

## 2013-10-31 ENCOUNTER — Ambulatory Visit (INDEPENDENT_AMBULATORY_CARE_PROVIDER_SITE_OTHER): Payer: Medicare Other | Admitting: Neurology

## 2013-10-31 ENCOUNTER — Encounter: Payer: Self-pay | Admitting: Neurology

## 2013-10-31 VITALS — BP 138/79 | HR 48 | Temp 97.9°F | Ht 71.0 in | Wt 185.0 lb

## 2013-10-31 DIAGNOSIS — R29818 Other symptoms and signs involving the nervous system: Secondary | ICD-10-CM

## 2013-10-31 DIAGNOSIS — I4891 Unspecified atrial fibrillation: Secondary | ICD-10-CM

## 2013-10-31 DIAGNOSIS — R2689 Other abnormalities of gait and mobility: Secondary | ICD-10-CM | POA: Insufficient documentation

## 2013-10-31 DIAGNOSIS — I442 Atrioventricular block, complete: Secondary | ICD-10-CM

## 2013-10-31 DIAGNOSIS — I480 Paroxysmal atrial fibrillation: Secondary | ICD-10-CM

## 2013-10-31 DIAGNOSIS — G4733 Obstructive sleep apnea (adult) (pediatric): Secondary | ICD-10-CM

## 2013-10-31 HISTORY — DX: Other abnormalities of gait and mobility: R26.89

## 2013-10-31 NOTE — Progress Notes (Signed)
Subjective:    Patient ID: Chad Russell is a 80 y.o. male.  HPI   Star Age, MD, PhD [facility] [address], [address] [place], [place] [address]  Dear Dr. Carlota Raspberry,  I saw your patient, Chad Russell, upon your kind request in my neurologic clinic today for initial consultation of his gait and balance problems. The patient is accompanied by his wife today. As you know, Chad Russell is a 80 year old right-handed gentleman with an underlying complex medical history of hypertension, complete heart block, coronary artery disease, status post non-STEMI, recurrent UTI, OSA (by history), BPH, atrial fibrillation on Xarelto and complete heart block, who reports progressive problems with his balance and near falls. Thankfully he has never actually fallen, but slid off the roof one time, fell 6 ft, 3 years ago. He was seen in the ER and injured his hip and had a large hematoma. You last saw him on 10/26/2013 and ordered a head CT without contrast which has not yet been done. Recent blood work from 9/4 was reviewed and showed no significant abnormalities.  He reports issues with his balance, which started gradually some 5 years ago. He recalls having had issues with painting a door in a confined space, he recalls having had bug bites, perhaps a black widow spider.   He has been a Scientist, product/process development for 40 years and up until 2010, he really had good balance.  He has had bradycardia for many years. He quit smoking at age 66. He drinks 1-2 glasses of wine per day for the past 8-9. He does not use a cane.  Never had TIA or stroke symptoms, denying sudden onset of one sided weakness, numbness, tingling, slurring of speech or droopy face, hearing loss, tinnitus, diplopia or visual field cut or monocular loss of vision, and denies recurrent headaches.  He has nocturia x 3. He has been on Xarelto. He has been on Flomax and takes multiple supplements over-the-counter. Of note, he  snores which can be loud at times. He has witnessed breathing pauses but never actually had a sleep study and has never been treated with CPAP. Snoring has been less since he has elevated his head with 2 pillows. He may have mild hearing loss. He denies vertigo and orthostatic dizziness. His balance seems to be worse when he is hot, working outside, and in confined spaces.  His Past Medical History Is Significant For: Past Medical History  Diagnosis Date  . Hypertension   . Complete heart block   . NSTEMI (non-ST elevated myocardial infarction)   . Talar fracture     casted no surg  . CAD (coronary artery disease)     LHC 03/16/11: Mid LAD 10-20%, proximal circumflex 10%, mid RCA 20%, EF 55%.  . Atrial fibrillation   . Hx of echocardiogram     Echo 02/2011: EF 65-70%, normal wall motion, MAC, mild MR, mild LAE, mild RVE  . OSA (obstructive sleep apnea)   . BPH (benign prostatic hyperplasia) 2014    Urology Dr [name] with [phone]     His Past Surgical History Is Significant For: Past Surgical History  Procedure Laterality Date  . Hemmorhoidectomy    . Colonoscopy      Repeated and normal in 2011  . Prostate biopsy      negative - [facility]    His Family History Is Significant For: Family History  Problem Relation Age of Onset  . Multiple sclerosis Father   .  Hypertension Mother     His Social History Is Significant For: History   Social History  . Marital Status: Married    Spouse Name: Mardene Celeste    Number of Children: 0  . Years of Education: 18   Occupational History  . 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 Smoking status: Former Smoker -- 1.00 packs/day for 3 years    Types: Cigarettes    Quit date: 03/17/1955  . Smokeless tobacco: None  . Alcohol Use: 1.2 oz/week    2 Glasses of wine per week     Comment: daily  . Drug Use: No  . Sexual Activity: No   Other Topics Concern  . 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 Doxycycline     Upset stomach  . Keflex [Cephalexin]     Abdominal disruption  :   His Current Medications Are:  Outpatient Encounter Prescriptions as of 10/31/2013  Medication Sig  . B Complex-Biotin-FA (MULTI-B COMPLEX PO) Take by mouth daily.  . Biotin 10 MG TABS Take 10 mg by mouth daily.  Antoine Poche Seed OIL Take by mouth daily.  . cholecalciferol (VITAMIN D) 1000 UNITS tablet Take 4,000 Units by mouth daily.   Marland Kitchen CINNAMON PO Take by mouth. 1/4 tsp every am  . co-enzyme Q-10 30 MG capsule Take 30 mg by mouth daily.  . Cranberry 400 MG CAPS Take by mouth daily.  Marland Kitchen FLUZONE HIGH-DOSE injection   . lisinopril-hydrochlorothiazide (PRINZIDE,ZESTORETIC) 10-12.5 MG per tablet TAKE 1 TABLET BY MOUTH DAILY.  Marland Kitchen LUTEIN-ZEAXANTHIN PO Take 65 mg by mouth daily.   . magnesium oxide (MAG-OX) 400 MG tablet Take 400 mg by mouth daily.  Marland Kitchen OVER THE COUNTER MEDICATION Take 500 mg by mouth daily. Tumeric 500mg   . Probiotic Product (PROBIOTIC DAILY PO) Take 1 tablet by mouth daily.  Marland Kitchen Resveratrol 250 MG CAPS Take 250 mg by mouth daily.  . saw palmetto 160 MG capsule Take 160 mg by mouth 2 (two) times daily.  . tamsulosin (FLOMAX) 0.4 MG CAPS capsule TAKE 1 CAPSULE (0.4 MG TOTAL) BY MOUTH DAILY.  Marland Kitchen TURMERIC PO Take by mouth daily.  . vitamin B-12 (CYANOCOBALAMIN) 1000 MCG tablet Take 1,000 mcg by mouth daily.  . vitamin C (ASCORBIC ACID) 500 MG tablet Take 500 mg by mouth daily.  . vitamin E 400 UNIT capsule Take 400 Units by mouth daily.  Alveda Reasons 20 MG TABS tablet TAKE 1 TABLET BY MOUTH DAILY  : Review of Systems:  Out of a complete 14 point review of systems, all are reviewed and negative with the exception of these symptoms as listed below:   Review of Systems  Constitutional: Positive for  fatigue.  Gastrointestinal: Positive for constipation.  Genitourinary:       Urination problems  Skin:       itching  Neurological:       Balance issues,snoring    Objective:  Neurologic Exam  Physical Exam Physical Examination:   Filed Vitals:   10/31/13 0951  Temp: 97.9 F (36.6 C)   140/80, 43 sitting, 130/79, 48 standing, no orthostatic dizziness or vertigo.   General Examination: The patient is a very pleasant 80 y.o. male  in no acute distress. He appears somewhat frail and deconditioned and well groomed.   HEENT: Normocephalic, atraumatic, pupils are equal, round and reactive to light and accommodation. Funduscopic exam is normal with sharp disc margins noted. Extraocular tracking is good without limitation to gaze excursion or nystagmus noted. Normal smooth pursuit is noted. Hearing is mildly impaired. Tympanic membranes is clear on the left and obscured with cerumen on the right. Face is symmetric with normal facial animation and normal facial sensation. Speech is clear with no dysarthria noted. There is no hypophonia. There is no lip, neck/head, jaw or voice tremor. Neck is supple with full range of passive and active motion. There are no carotid bruits on auscultation. Oropharynx exam reveals: moderate mouth dryness, adequate dental hygiene and mild airway crowding, due to thicker time, tonsils in place, and an elongated uvula. Mallampati is class II. Tongue protrudes centrally and palate elevates symmetrically. Tonsils are 1+ in size/absent. Neck size is 16.75 inches.    Chest: Clear to auscultation without wheezing, rhonchi or crackles noted.  Heart: S1+S2+0, regular and normal without murmurs, rubs or gallops noted.   Abdomen: Soft, non-tender and non-distended with normal bowel sounds appreciated on auscultation.  Extremities: There is no pitting edema in the distal lower extremities bilaterally. Pedal pulses are intact.  Skin: Warm and dry without trophic changes  noted. There are multiple varicose veins.  Musculoskeletal: exam reveals no obvious joint deformities, tenderness or joint swelling or erythema.   Neurologically:  Mental status: The patient is awake, alert and oriented in all 4 spheres. His immediate and remote memory, attention, language skills and fund of knowledge are appropriate. There is no evidence of aphasia, agnosia, apraxia or anomia. Speech is clear with normal prosody and enunciation. Thought process is linear. Mood is normal and affect is normal.  Cranial nerves II - XII are as described above under HEENT exam. In addition: shoulder shrug is normal with equal shoulder height noted. Motor exam: Normal bulk, strength and tone is noted. There is no drift, tremor or rebound. Romberg is negative. Reflexes are 2+ throughout. Babinski: Toes are flexor bilaterally. Fine motor skills and coordination: intact with normal finger taps, normal hand movements, normal rapid alternating patting, normal foot taps and normal foot agility.  Cerebellar testing: No dysmetria or intention tremor on finger to nose testing. Heel to shin is unremarkable bilaterally. There is no truncal or gait ataxia.  Sensory exam: intact to light touch, pinprick, vibration, temperature sense in the upper and lower extremities.  Gait, station and balance: He stands with difficulty. No veering to one side is noted. No leaning to one side is noted. Posture is age-appropriate and stance is slightly wide-based. He is not able to do tandem walk. His gait is cautious and slightly slow for someone who has been physically very fit and active. He turns slightly slowly and cautiously.   Assessment and Plan:   In summary, Epimenio I Gathright is a very pleasant 80 y.o.-year old male with an underlying complex medical history of hypertension, complete heart block, coronary artery disease, status post non-STEMI, recurrent UTI, OSA (by history), BPH, atrial fibrillation on Xarelto and complete  heart block, who reports progressive problems with his balance and near falls of approximately 5 years duration. Upon examination he has no focal neurologic findings. He has a cautious gait and is unable to tandem walk safely. I believe his balance issues are probably a combination of things. He is advised to stay better hydrated. Upon further questioning,  he does not drink enough water. I've asked him to increase his water intake to at least 6 glasses of water per day. He may do better with using a cane as needed. Given his heart disease, would like to suggest a brain MRI as opposed to a CAT scan to look for atherosclerotic changes. I have advised him to consider reducing his alcohol consumption and may be limit himself to just one glass of wine per day. He is scheduled for a head CT but would be willing to have it switched if you agree and he would like to run this by you. He may benefit from a round of physical therapy which he can discuss is well with you. He has been labeled with sleep apnea but never actually had a sleep study. I would like to suggest a sleep study and consider treatment for sleep apnea if the need arises we'll in the form of CPAP. He would be willing to get tested and also try CPAP if the need arises. I did not suggest any new medications but it that was one culprit on the list of his medication it may be the Flomax. At this juncture, I suggested that I see him back after his sleep study and his MRI is completed. I have not ordered a brain MRI as yet as he would like to discuss this with you first and maybe have you change the order from a head CT to a brain MRI. We talked about maintaining a healthy lifestyle in general. I encouraged the patient to eat healthy, exercise daily and keep well hydrated, to keep a scheduled bedtime and wake time routine, to not skip any meals and eat healthy snacks in between meals and to have protein with every meal.  I answered all their questions today and the  patient and his wife were in agreement with the above outlined plan. Thank you very much for allowing me to participate in the care of this nice patient. If I can be of any further assistance to you please do not hesitate to call me at [phone].  Sincerely,   Star Age, MD, PhD

## 2013-10-31 NOTE — Patient Instructions (Addendum)
I believe your balance problems are due to several issues including: normal aging, excessive alcohol use, degenerative arthritis of your back, and possible atherosclerosis of the blood vessels in your brain.   Remember to drink plenty of fluid, eat healthy meals and do not skip any meals. Try to eat protein with a every meal and eat a healthy snack such as fruit or nuts in between meals. Try to keep a regular sleep-wake schedule and try to exercise daily, particularly in the form of walking, 10-20 minutes a day, if you can. Change positions slowly and you should start using a cane for safety.   As far as your medications are concerned, I would like to suggest no new medications. Think about reducing your alcohol intake.   As far as diagnostic testing: I would like to recommend an MRI brain as opposed to CT head and will recommend this to Dr. Carlota Raspberry.   Based on your symptoms and your exam I believe you are at risk for obstructive sleep apnea or OSA, and I think we should proceed with a sleep study to determine whether you do or do not have OSA and how severe it is. If you have more than mild OSA, I want you to consider treatment with CPAP. Please remember, the risks and ramifications of moderate to severe obstructive sleep apnea or OSA are: Cardiovascular disease, including congestive heart failure, stroke, difficult to control hypertension, arrhythmias, and even type 2 diabetes has been linked to untreated OSA. Sleep apnea causes disruption of sleep and sleep deprivation in most cases, which, in turn, can cause recurrent headaches, problems with memory, mood, concentration, focus, and vigilance. Most people with untreated sleep apnea report excessive daytime sleepiness, which can affect their ability to drive. Please do not drive if you feel sleepy.  I will see you back after your sleep study to go over the test results and where to go from there. We will call you after your sleep study and to set up an  appointment at the time.

## 2013-11-05 ENCOUNTER — Other Ambulatory Visit: Payer: Self-pay | Admitting: Cardiology

## 2013-11-13 ENCOUNTER — Ambulatory Visit (INDEPENDENT_AMBULATORY_CARE_PROVIDER_SITE_OTHER): Payer: Medicare Other | Admitting: Family Medicine

## 2013-11-13 ENCOUNTER — Telehealth: Payer: Self-pay | Admitting: Radiology

## 2013-11-13 VITALS — BP 162/94 | HR 36 | Temp 97.7°F | Resp 16 | Ht 71.5 in | Wt 182.6 lb

## 2013-11-13 DIAGNOSIS — R1900 Intra-abdominal and pelvic swelling, mass and lump, unspecified site: Secondary | ICD-10-CM

## 2013-11-13 DIAGNOSIS — R29818 Other symptoms and signs involving the nervous system: Secondary | ICD-10-CM

## 2013-11-13 DIAGNOSIS — R2689 Other abnormalities of gait and mobility: Secondary | ICD-10-CM

## 2013-11-13 DIAGNOSIS — R269 Unspecified abnormalities of gait and mobility: Secondary | ICD-10-CM

## 2013-11-13 NOTE — Telephone Encounter (Signed)
MRI scan ordered, to replace order for CT. Patient has no additional questions, to you [facility]

## 2013-11-13 NOTE — Patient Instructions (Signed)
Increase fluids and regular meals with protein as discussed. We will schedule th MRI of brain, and once we have the results of this and sleep study, can decide on next step - including possible physical therapy.   Stool softener if needed to lessen straining with bowel movements. If swelling in abdomen returns - return for recheck, or if painful and unable to press back in - emergency room if needed - see precautions below. This may be a hernia, so if recurs, may need to see a Psychologist, sport and exercise.  Hernia A hernia occurs when an internal organ pushes out through a weak spot in the abdominal wall. Hernias most commonly occur in the groin and around the navel. Hernias often can be pushed back into place (reduced). Most hernias tend to get worse over time. Some abdominal hernias can get stuck in the opening (irreducible or incarcerated hernia) and cannot be reduced. An irreducible abdominal hernia which is tightly squeezed into the opening is at risk for impaired blood supply (strangulated hernia). A strangulated hernia is a medical emergency. Because of the risk for an irreducible or strangulated hernia, surgery may be recommended to repair a hernia. CAUSES   Heavy lifting.  Prolonged coughing.  Straining to have a bowel movement.  A cut (incision) made during an abdominal surgery. HOME CARE INSTRUCTIONS   Bed rest is not required. You may continue your normal activities.  Avoid lifting more than 10 pounds (4.5 kg) or straining.  Cough gently. If you are a smoker it is best to stop. Even the best hernia repair can break down with the continual strain of coughing. Even if you do not have your hernia repaired, a cough will continue to aggravate the problem.  Do not wear anything tight over your hernia. Do not try to keep it in with an outside bandage or truss. These can damage abdominal contents if they are trapped within the hernia sac.  Eat a normal diet.  Avoid constipation. Straining over long periods  of time will increase hernia size and encourage breakdown of repairs. If you cannot do this with diet alone, stool softeners may be used. SEEK IMMEDIATE MEDICAL CARE IF:   You have a fever.  You develop increasing abdominal pain.  You feel nauseous or vomit.  Your hernia is stuck outside the abdomen, looks discolored, feels hard, or is tender.  You have any changes in your bowel habits or in the hernia that are unusual for you.  You have increased pain or swelling around the hernia.  You cannot push the hernia back in place by applying gentle pressure while lying down. MAKE SURE YOU:   Understand these instructions.  Will watch your condition.  Will get help right away if you are not doing well or get worse. Document Released: 02/08/2005 Document Revised: 05/03/2011 Document Reviewed: 09/28/2007 [facility] Patient Information 2015 [place], [place]. This information is not intended to replace advice given to you by your health care provider. Make sure you discuss any questions you have with your health care provider.

## 2013-11-13 NOTE — Telephone Encounter (Signed)
Spoke to patient.  He states that he has no metal in his body nor does he have claustrophobia. He is fine with proceeding with the MRI.

## 2013-11-13 NOTE — Progress Notes (Signed)
Subjective:    Patient ID: Chad Russell, male    DOB: 1926-09-26, 80 y.o.   MRN: [id number]  HPI Chad Russell is a 80 y.o. male  Balance problem - see prior ov. Sx's x 5 years. Possible multifactorial issue, with multiple comorbidities,including CAD, complete heart block, anticholinergic effect of Flomax, but referred to neurology.  Initially planned on CT head, but per neuro recommendations - changed to MRI brain - pending. No focal neuro findings on neuro eval, and advised to increase fluid intake. Also advised cane for stability, decrease alcohol to 1 glass per day, regular meals with protein intake, regular wake and bedtime routine, and planning on sleep study for OSA - this is scheduled this Sunday. Also considering physical therapy. No new neurologic sx's.  No falls. Doing normal activities.     Today presents with concern of hernia. Noticed bulge in R groin area this morning.  No pain, no pain with straining, and just noticed this am. Constipated past few days. Hard stools past few days, with straining to have BM. Softer stools today - 2 bm's today.  No watery stool. No blood in stools and able to defecate easier today. No fever, no abd pain.  No new urinary symptoms. Other abdominal pain from last ov has improved.    Patient Active Problem List   Diagnosis Date Noted  . Balance problem 10/31/2013  . OSA (obstructive sleep apnea) 10/31/2013  . BPH (benign prostatic hyperplasia) 08/28/2012  . Atrial fibrillation 06/08/2012  . Bacteremia 04/28/2012  . Hyperplasia of prostate with lower urinary tract symptoms (LUTS) 04/28/2012  . Calf pain 04/28/2012  . Moderate malnutrition 04/28/2012  . Influenza-like illness 04/27/2012  . Infection of urinary tract - recurrent 04/26/2012  . NSTEMI (non-ST elevated myocardial infarction) 03/15/2011  . Complete heart block 03/15/2011  . HTN (hypertension) 03/15/2011   Past Medical History  Diagnosis Date  . Hypertension   . Complete  heart block   . NSTEMI (non-ST elevated myocardial infarction)   . Talar fracture     casted no surg  . CAD (coronary artery disease)     LHC 03/16/11: Mid LAD 10-20%, proximal circumflex 10%, mid RCA 20%, EF 55%.  . Atrial fibrillation   . Hx of echocardiogram     Echo 02/2011: EF 65-70%, normal wall motion, MAC, mild MR, mild LAE, mild RVE  . OSA (obstructive sleep apnea)   . BPH (benign prostatic hyperplasia) 2014    Urology Dr [name] with [facility] [phone]   . Balance problem 10/31/2013   Past Surgical History  Procedure Laterality Date  . Hemmorhoidectomy    . Colonoscopy      Repeated and normal in 2011  . Prostate biopsy      negative - [facility]   Allergies  Allergen Reactions  . Doxycycline     Upset stomach  . Keflex [Cephalexin]     Abdominal disruption   Prior to Admission medications   Medication Sig Start Date End Date Taking? Authorizing Provider  B Complex-Biotin-FA (MULTI-B COMPLEX PO) Take by mouth daily.   Yes Historical Provider, MD  Biotin 10 MG TABS Take 10 mg by mouth daily.   Yes Historical Provider, MD  Celery Seed OIL Take by mouth daily.   Yes Historical Provider, MD  cholecalciferol (VITAMIN D) 1000 UNITS tablet Take 4,000 Units by mouth daily.    Yes Historical Provider, MD  CINNAMON PO Take by mouth. 1/4 tsp every am   Yes Historical  Provider, MD  co-enzyme Q-10 30 MG capsule Take 30 mg by mouth daily.   Yes Historical Provider, MD  Cranberry 400 MG CAPS Take by mouth daily.   Yes Historical Provider, MD  FLUZONE HIGH-DOSE injection  11/02/12  Yes Historical Provider, MD  lisinopril-hydrochlorothiazide (PRINZIDE,ZESTORETIC) 10-12.5 MG per tablet TAKE 1 TABLET BY MOUTH DAILY.   Yes Minus Breeding, MD  LUTEIN-ZEAXANTHIN PO Take 65 mg by mouth daily.    Yes Historical Provider, MD  magnesium oxide (MAG-OX) 400 MG tablet Take 400 mg by mouth daily.   Yes Historical Provider, MD  OVER THE COUNTER MEDICATION Take 500 mg by mouth daily. Tumeric 500mg     Yes Historical Provider, MD  Probiotic Product (PROBIOTIC DAILY PO) Take 1 tablet by mouth daily.   Yes Historical Provider, MD  Resveratrol 250 MG CAPS Take 250 mg by mouth daily.   Yes Historical Provider, MD  saw palmetto 160 MG capsule Take 160 mg by mouth 2 (two) times daily.   Yes Historical Provider, MD  tamsulosin (FLOMAX) 0.4 MG CAPS capsule TAKE 1 CAPSULE (0.4 MG TOTAL) BY MOUTH DAILY. 11/23/12  Yes [name], MD  TURMERIC PO Take by mouth daily.   Yes Historical Provider, MD  vitamin B-12 (CYANOCOBALAMIN) 1000 MCG tablet Take 1,000 mcg by mouth daily.   Yes Historical Provider, MD  vitamin C (ASCORBIC ACID) 500 MG tablet Take 500 mg by mouth daily.   Yes Historical Provider, MD  vitamin E 400 UNIT capsule Take 400 Units by mouth daily.   Yes Historical Provider, MD  XARELTO 20 MG TABS tablet TAKE 1 TABLET BY MOUTH DAILY 11/05/13  Yes Minus Breeding, MD   History   Social History  . Marital Status: Married    Spouse Name: Mardene Celeste    Number of Children: 0  . Years of Education: 18   Occupational History  . 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 Smoking status: Former Smoker -- 1.00 packs/day for 3 years    Types: Cigarettes    Quit date: 03/17/1955  . Smokeless tobacco: Not on file  . Alcohol Use: 1.2 oz/week    2 Glasses of wine per week     Comment: daily  . Drug Use: No  . Sexual Activity: No   Other Topics Concern  . 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       Swollen area R lower abdomen - resolved on own.   Skin: Negative for rash and wound.  Neurological:       Stable balance difficulty. nonfocal       Objective:   Physical Exam  Vitals  reviewed. Constitutional: He is oriented to person, place, and time. He appears well-developed and well-nourished. No distress.  Cardiovascular:  No extrasystoles are present. Bradycardia present.   Pulmonary/Chest: Effort normal and breath sounds normal.  Abdominal: Soft. Bowel sounds are normal. He exhibits no distension and no mass. There is no tenderness. There is no rebound and no guarding. No hernia. Hernia confirmed negative in the ventral area, confirmed negative in the right inguinal area and confirmed negative in the left inguinal area.    Neurological: He  is alert and oriented to person, place, and time. Gait (somewhat slowed initially/cautious gait. ) normal.  Skin: Skin is warm and dry.  Psychiatric: He has a normal mood and affect. His behavior is normal.   Filed Vitals:   11/13/13 1429  BP: 162/94  Pulse: 36  Temp: 97.7 F (36.5 C)  TempSrc: Oral  Resp: 16  Height: 5' 11.5" (1.816 m)  Weight: 182 lb 9.6 oz (82.827 kg)  SpO2: 99%       Assessment & Plan:   TAJAY MUZZY is a 80 y.o. male Balance problem  -workup pending, including MRI brain, sleep study  -discussed increased po fluids, protein with regular meals, and decrease alcohol to 1 glass or less a day (minimal change from reported current intake).    -consider PT.  Follow up in next month to decide next step.   -RTC/ER precautions.   Abdominal wall swelling  -ddx of spigelian hernia, but asx when occurred and not able to palpate any abnormality at present. If recurs, consider CT scanning to further identify if this is true hernia. RTC/ER precautions discussed for hernias and handout below. Left message on VM after received recommendations about CT scanning from surgeon's office.   No orders of the defined types were placed in this encounter.   Patient Instructions  Increase fluids and regular meals with protein as discussed. We will schedule th MRI of brain, and once we have the results of this and  sleep study, can decide on next step - including possible physical therapy.   Stool softener if needed to lessen straining with bowel movements. If swelling in abdomen returns - return for recheck, or if painful and unable to press back in - emergency room if needed - see precautions below. This may be a hernia, so if recurs, may need to see a Psychologist, sport and exercise.  Hernia A hernia occurs when an internal organ pushes out through a weak spot in the abdominal wall. Hernias most commonly occur in the groin and around the navel. Hernias often can be pushed back into place (reduced). Most hernias tend to get worse over time. Some abdominal hernias can get stuck in the opening (irreducible or incarcerated hernia) and cannot be reduced. An irreducible abdominal hernia which is tightly squeezed into the opening is at risk for impaired blood supply (strangulated hernia). A strangulated hernia is a medical emergency. Because of the risk for an irreducible or strangulated hernia, surgery may be recommended to repair a hernia. CAUSES   Heavy lifting.  Prolonged coughing.  Straining to have a bowel movement.  A cut (incision) made during an abdominal surgery. HOME CARE INSTRUCTIONS   Bed rest is not required. You may continue your normal activities.  Avoid lifting more than 10 pounds (4.5 kg) or straining.  Cough gently. If you are a smoker it is best to stop. Even the best hernia repair can break down with the continual strain of coughing. Even if you do not have your hernia repaired, a cough will continue to aggravate the problem.  Do not wear anything tight over your hernia. Do not try to keep it in with an outside bandage or truss. These can damage abdominal contents if they are trapped within the hernia sac.  Eat a normal diet.  Avoid constipation. Straining over long periods of time will increase hernia size and encourage breakdown of repairs. If you cannot do this with diet alone, stool softeners may be  used. SEEK IMMEDIATE MEDICAL CARE IF:  You have a fever.  You develop increasing abdominal pain.  You feel nauseous or vomit.  Your hernia is stuck outside the abdomen, looks discolored, feels hard, or is tender.  You have any changes in your bowel habits or in the hernia that are unusual for you.  You have increased pain or swelling around the hernia.  You cannot push the hernia back in place by applying gentle pressure while lying down. MAKE SURE YOU:   Understand these instructions.  Will watch your condition.  Will get help right away if you are not doing well or get worse. Document Released: 02/08/2005 Document Revised: 05/03/2011 Document Reviewed: 09/28/2007 [facility] Patient Information 2015 [place], [place]. This information is not intended to replace advice given to you by your health care provider. Make sure you discuss any questions you have with your health care provider.

## 2013-11-13 NOTE — Telephone Encounter (Signed)
Message copied by Candice Camp on Tue Nov 13, 2013 10:05 AM ------      Message from: [name]      Created: Sat Nov 10, 2013 11:49 PM       I ordered a head CT for Mr. Ozer, but per neurology note, recommended to change this to brain MRI.. I am fine with cancelling head ct and cna have brain MRI ordered if ok with Mr. Sulton. Brain MRI was favored due to his history of heart diease to evaluate for cerebral atherosclerotic disease. Let me know if he has questions.  ------

## 2013-11-13 NOTE — Telephone Encounter (Signed)
MRI brain ordered patient has no additional questions.

## 2013-11-13 NOTE — Addendum Note (Signed)
Addended byCandice Camp on: 11/13/2013 12:22 PM   Modules accepted: Orders

## 2013-11-18 ENCOUNTER — Ambulatory Visit (INDEPENDENT_AMBULATORY_CARE_PROVIDER_SITE_OTHER): Payer: Medicare Other | Admitting: Neurology

## 2013-11-18 DIAGNOSIS — I442 Atrioventricular block, complete: Secondary | ICD-10-CM

## 2013-11-18 DIAGNOSIS — I480 Paroxysmal atrial fibrillation: Secondary | ICD-10-CM

## 2013-11-18 DIAGNOSIS — G4761 Periodic limb movement disorder: Secondary | ICD-10-CM

## 2013-11-18 DIAGNOSIS — R0989 Other specified symptoms and signs involving the circulatory and respiratory systems: Secondary | ICD-10-CM

## 2013-11-18 DIAGNOSIS — R0609 Other forms of dyspnea: Secondary | ICD-10-CM

## 2013-11-18 DIAGNOSIS — G4733 Obstructive sleep apnea (adult) (pediatric): Secondary | ICD-10-CM

## 2013-11-18 DIAGNOSIS — R2689 Other abnormalities of gait and mobility: Secondary | ICD-10-CM

## 2013-11-21 ENCOUNTER — Ambulatory Visit
Admission: RE | Admit: 2013-11-21 | Discharge: 2013-11-21 | Disposition: A | Discharge status: Home / Self Care | Payer: Medicare Other | Source: Ambulatory Visit | Attending: Family Medicine | Admitting: Family Medicine

## 2013-11-21 DIAGNOSIS — R269 Unspecified abnormalities of gait and mobility: Secondary | ICD-10-CM

## 2013-11-23 ENCOUNTER — Telehealth: Payer: Self-pay | Admitting: Neurology

## 2013-11-23 NOTE — Telephone Encounter (Signed)
Please call and notify the patient that the recent sleep study did not show any significant obstructive sleep apnea. Please inform patient that I would like to go over the details of the study during a follow up appointment and if not already previously scheduled, arrange a followup appointment (please utilize a followu-up slot). Also, route or fax report to PCP and referring MD, if other than PCP.  Once you have spoken to patient, you can close this encounter.   Thanks,  Star Age, MD, PhD [facility])

## 2013-11-29 NOTE — Telephone Encounter (Signed)
Patient contacted and provided the results of his overnight sleep study that showed a normal AHI.  Patient was instructed a copy would be mailed to him and a copy was faxed to Dr. [name].  Patient was transferred to schedule a follow up appointment with Dr. Rexene Alberts

## 2013-11-30 ENCOUNTER — Encounter: Payer: Self-pay | Admitting: Neurology

## 2013-11-30 ENCOUNTER — Ambulatory Visit (INDEPENDENT_AMBULATORY_CARE_PROVIDER_SITE_OTHER): Payer: Medicare Other | Admitting: Neurology

## 2013-11-30 VITALS — BP 126/90 | HR 42 | Temp 97.8°F | Resp 14 | Ht 72.0 in | Wt 187.0 lb

## 2013-11-30 DIAGNOSIS — I442 Atrioventricular block, complete: Secondary | ICD-10-CM

## 2013-11-30 DIAGNOSIS — I480 Paroxysmal atrial fibrillation: Secondary | ICD-10-CM

## 2013-11-30 DIAGNOSIS — R29818 Other symptoms and signs involving the nervous system: Secondary | ICD-10-CM

## 2013-11-30 DIAGNOSIS — R2689 Other abnormalities of gait and mobility: Secondary | ICD-10-CM

## 2013-11-30 NOTE — Patient Instructions (Signed)
MRI brain looked age-appropriate.  Drink more water.  Please have your R ear waxed irrigated out.  Exercise in moderation.  Thankfully her sleep study did not indicate any significant obstructive sleep apnea. I agree, you are not heavy drinker. Nevertheless, current recommendations suggest that you limit yourself to one glass of wine. I can see you back as needed.

## 2013-11-30 NOTE — Progress Notes (Signed)
Subjective:    Patient ID: Chad Russell is a 80 y.o. male.  HPI    Interim history:   Chad Russell is an 80 year old right-handed gentleman with an underlying complex medical history of hypertension, complete heart block, coronary artery disease, status post non-STEMI, recurrent UTI, OSA (by history), BPH, atrial fibrillation on Xarelto and complete heart block, who presents for followup consultation of his balance issues and near falls. He is unaccompanied today. I first met him on 10/31/2013, at which time he reported progressive problems with his balance and near falls. He was scheduled for a head CT by his primary care physician at the time. He actually had a brain MRI without contrast on 11/21/2013: No acute intracranial abnormality, and largely unremarkable for age non contrast MRI appearance of the brain. In addition, I personally reviewed the images through the PACS system. Since he reported snoring and witnessed breathing pauses, I suggested he come back for a sleep study. He had a baseline sleep study on  11/18/2013 and went over his test results with him in detail today: His sleep efficiency was markedly reduced at 53.8% with a latency to sleep of 92 minutes and wake after sleep onset of 115.5 minutes with overall mild sleep fragmentation noted and the inability to return to sleep after approximately 3:30 AM after a bathroom break. He went to the restroom 2 times. The arousal index was normal at 5 per hour. He had an increased percentage of stage II sleep, absence of slow-wave sleep, and is a mildly increased percentage of REM sleep at 26.5% with a mildly reduced REM latency of 56 minutes. He had mild periodic leg movements at 14.6 per hour, resulting in only 0.6 arousals per hour. He had frequent PVCs. He had atrial fibrillation. Mild intermittent snoring was noted. Overall AHI was 3.8 per hour, baseline oxygen saturation was 93%, nadir was 90%.  Today, he reports that he was surprised and  am happy to see that my note indicated last time that he was a heavy drinker. I clarified to him that I did not mean to indicate that he is an alcoholic, but I did indicate to him that current suggestions or recommendations indicate that patient's limit themselves to one glass of wine per day and not 2. He feels overall unchanged. He is trying to drink more water. He does not go to the gym but is intending to sign up with Silver sneakers. He does not endorse any vertigo. He does not endorse any lightheadedness.  He reported issues with his balance, which started gradually some 5 years ago. He recalls having had issues with painting a door in a confined space, he recalls having had bug bites, perhaps a black widow spider.  He has been a Scientist, product/process development for 40 years and up until 2010, he really had good balance.  He has had bradycardia for many years. He quit smoking at age 45. He drinks 1-2 glasses of wine per day for the past 8-9. He does not use a cane.  Never had TIA or stroke symptoms, denying sudden onset of one sided weakness, numbness, tingling, slurring of speech or droopy face, hearing loss, tinnitus, diplopia or visual field cut or monocular loss of vision, and denies recurrent headaches.  He has nocturia x 3. He has been on Xarelto. He has been on Flomax and takes multiple supplements over-the-counter.  He may have mild hearing loss. He denies vertigo and orthostatic dizziness. His balance seems to be worse when he  is hot, working outside, and in confined spaces.   His Past Medical History Is Significant For: Past Medical History  Diagnosis Date  . Hypertension   . Complete heart block   . NSTEMI (non-ST elevated myocardial infarction)   . Talar fracture     casted no surg  . CAD (coronary artery disease)     LHC 03/16/11: Mid LAD 10-20%, proximal circumflex 10%, mid RCA 20%, EF 55%.  . Atrial fibrillation   . Hx of echocardiogram     Echo 02/2011: EF 65-70%, normal wall motion, MAC,  mild MR, mild LAE, mild RVE  . OSA (obstructive sleep apnea)   . BPH (benign prostatic hyperplasia) 2014    Urology Dr [name] with [facility] [phone]   . Balance problem 10/31/2013    His Past Surgical History Is Significant For: Past Surgical History  Procedure Laterality Date  . Hemmorhoidectomy    . Colonoscopy      Repeated and normal in 2011  . Prostate biopsy      negative - [facility]    His Family History Is Significant For: Family History  Problem Relation Age of Onset  . Multiple sclerosis Father   . Hypertension Mother     His Social History Is Significant For: History   Social History  . Marital Status: Married    Spouse Name: Chad Russell    Number of Children: 0  . Years of Education: 18   Occupational History  . 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 Smoking status: Former Smoker -- 1.00 packs/day for 3 years    Types: Cigarettes    Quit date: 03/17/1955  . Smokeless tobacco: None  . Alcohol Use: 1.2 oz/week    2 Glasses of wine per week     Comment: daily  . Drug Use: No  . Sexual Activity: No   Other Topics Concern  . 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 Doxycycline     Upset stomach  . Keflex [Cephalexin]     Abdominal disruption  :   His Current Medications Are:  Outpatient Encounter Prescriptions as of 11/30/2013  Medication Sig  . B Complex-Biotin-FA (MULTI-B COMPLEX PO) Take by mouth daily.  . Biotin 10 MG TABS Take 10 mg by mouth daily.  Antoine Poche Seed OIL Take by mouth daily.  . cholecalciferol (VITAMIN D) 1000 UNITS tablet Take 4,000 Units by mouth daily.   Marland Kitchen CINNAMON PO Take by mouth. 1/4 tsp every am  . co-enzyme Q-10 30 MG capsule Take 30 mg by mouth daily.  . Cranberry 400 MG CAPS Take by  mouth daily.  Marland Kitchen FLUZONE HIGH-DOSE injection   . lisinopril-hydrochlorothiazide (PRINZIDE,ZESTORETIC) 10-12.5 MG per tablet TAKE 1 TABLET BY MOUTH DAILY.  Marland Kitchen LUTEIN-ZEAXANTHIN PO Take 65 mg by mouth daily.   . magnesium oxide (MAG-OX) 400 MG tablet Take 400 mg by mouth daily.  Marland Kitchen OVER THE COUNTER MEDICATION Take 500 mg by mouth daily. Tumeric 565m  . Probiotic Product (PROBIOTIC DAILY PO) Take 1 tablet by mouth daily.  .Marland KitchenResveratrol 250 MG CAPS Take 250 mg by mouth daily.  . saw palmetto 160 MG capsule Take 160 mg by mouth 2 (two) times  daily.  . tamsulosin (FLOMAX) 0.4 MG CAPS capsule TAKE 1 CAPSULE (0.4 MG TOTAL) BY MOUTH DAILY.  Marland Kitchen TURMERIC PO Take by mouth daily.  . vitamin B-12 (CYANOCOBALAMIN) 1000 MCG tablet Take 1,000 mcg by mouth daily.  . vitamin C (ASCORBIC ACID) 500 MG tablet Take 500 mg by mouth daily.  . vitamin E 400 UNIT capsule Take 400 Units by mouth daily.  Alveda Reasons 20 MG TABS tablet TAKE 1 TABLET BY MOUTH DAILY  :  Review of Systems:  Out of a complete 14 point review of systems, all are reviewed and negative with the exception of these symptoms as listed below:   Review of Systems  All other systems reviewed and are negative.   Objective:  Neurologic Exam  Physical Exam Physical Examination:   Filed Vitals:   11/30/13 0825  BP: 126/90  Pulse: 42  Temp: 97.8 F (36.6 C)  Resp: 14   General Examination: The patient is a very pleasant 80 y.o. male in no acute distress. He appears somewhat frail and mildly deconditioned and is well groomed.   HEENT: Normocephalic, atraumatic, pupils are equal, round and reactive to light and accommodation. Funduscopic exam is normal with sharp disc margins noted. Extraocular tracking is good without limitation to gaze excursion or nystagmus noted. Normal smooth pursuit is noted. Hearing is mildly impaired. Tympanic membranes is clear on the left and again noted to be obscured with cerumen on the right. Face is symmetric with  normal facial animation and normal facial sensation. Speech is clear with no dysarthria noted. There is no hypophonia. There is no lip, neck/head, jaw or voice tremor. Neck is supple with full range of passive and active motion. There are no carotid bruits on auscultation. Oropharynx exam reveals: moderate mouth dryness, adequate dental hygiene and mild airway crowding, due to thicker time, tonsils in place, and an elongated uvula. Mallampati is class II. Tongue protrudes centrally and palate elevates symmetrically.   Chest: Clear to auscultation without wheezing, rhonchi or crackles noted.  Heart: S1+S2+0, regular and normal without murmurs, rubs or gallops noted.   Abdomen: Soft, non-tender and non-distended with normal bowel sounds appreciated on auscultation.  Extremities: There is no pitting edema in the distal lower extremities bilaterally. Pedal pulses are intact.  Skin: Warm and dry without trophic changes noted. There are multiple varicose veins.  Musculoskeletal: exam reveals no obvious joint deformities, tenderness or joint swelling or erythema.   Neurologically:  Mental status: The patient is awake, alert and oriented in all 4 spheres. His immediate and remote memory, attention, language skills and fund of knowledge are appropriate. There is no evidence of aphasia, agnosia, apraxia or anomia. Speech is clear with normal prosody and enunciation. Thought process is linear. Mood is normal and affect is normal.  Cranial nerves II - XII are as described above under HEENT exam. In addition: shoulder shrug is normal with equal shoulder height noted. Motor exam: Normal bulk, strength and tone is noted. There is no drift, tremor or rebound. Reflexes are 2+ throughout. Fine motor skills and coordination: intact with normal finger taps, normal hand movements, normal rapid alternating patting, normal foot taps and normal foot agility.  Cerebellar testing: No dysmetria or intention tremor on finger  to nose testing. Heel to shin is unremarkable bilaterally. There is no truncal or gait ataxia.  Sensory exam: intact to light touch.  Gait, station and balance: He stands with no difficulty. No veering to one side is noted. No leaning to one  side is noted. Posture is age-appropriate, but I do detect that he may have a slight degree of scoliosis and his right shoulder blade protrudes out just a little bit. stance is slightly wide-based. His gait is slightly slow. He turns slightly slowly and cautiously.   Assessment and Plan:   In summary, Chad Russell is a very pleasant 80 year old male with an underlying complex medical history of hypertension, complete heart block, coronary artery disease, status post non-STEMI, recurrent UTI, OSA (by history), BPH, atrial fibrillation on Xarelto and complete heart block, who reports progressive problems with his balance and near falls of approximately 5 years duration. Upon examination he has no focal neurologic findings. He has a cautious gait workup thus far reveals an age-appropriate brain MRI and negative sleep study for obstructive sleep apnea. Today we spent most of our visit discussing recent test results including blood work through his primary care physician, MRI results and sleep test results. He is advised to stay better hydrated and continue exercising and moderation. He is encouraged to sign out with his gym. He is encouraged to limit himself to one glass of wine per day. I clarified to him that I did not believe he was a heavy drinker and did not mean to indicate that last time. Nevertheless, most recommendations regarding alcohol intake on a daily basis indicate limitation to one glass of wine per day. All in all, I do not believe he has one specific issue here. He certainly does not indicate orthostatic symptoms or true vertigo. I did encourage him to have his wax cleared out of his right ear. He does believe he hears better on the left. At this  juncture, I suggested he followup with me on an as-needed basis. I answered all their questions today and the patient and his wife were in agreement.

## 2013-12-04 ENCOUNTER — Ambulatory Visit (INDEPENDENT_AMBULATORY_CARE_PROVIDER_SITE_OTHER): Payer: Medicare Other | Admitting: Family Medicine

## 2013-12-04 VITALS — BP 134/72 | HR 95 | Temp 97.5°F | Resp 16 | Ht 72.0 in | Wt 186.0 lb

## 2013-12-04 DIAGNOSIS — R2689 Other abnormalities of gait and mobility: Secondary | ICD-10-CM

## 2013-12-04 DIAGNOSIS — H6121 Impacted cerumen, right ear: Secondary | ICD-10-CM

## 2013-12-04 DIAGNOSIS — Z23 Encounter for immunization: Secondary | ICD-10-CM

## 2013-12-04 DIAGNOSIS — R29818 Other symptoms and signs involving the nervous system: Secondary | ICD-10-CM

## 2013-12-04 DIAGNOSIS — K4090 Unilateral inguinal hernia, without obstruction or gangrene, not specified as recurrent: Secondary | ICD-10-CM

## 2013-12-04 NOTE — Patient Instructions (Addendum)
I will refer you for evaluation of the hernia on the right side. See precautions below.  If surgery is recommended, you will likely need to be evaluated for surgical clearance by your cardiologist and other discussion or lab testing with me. Let me know.   As discussed, make sure you are staying hydrated, and if balance difficulties persist - can look at Flomax dosing or possibly different medication.  Return to the clinic or go to the nearest emergency room if any of your symptoms worsen or new symptoms occur.  Hernia A hernia occurs when an internal organ pushes out through a weak spot in the abdominal wall. Hernias most commonly occur in the groin and around the navel. Hernias often can be pushed back into place (reduced). Most hernias tend to get worse over time. Some abdominal hernias can get stuck in the opening (irreducible or incarcerated hernia) and cannot be reduced. An irreducible abdominal hernia which is tightly squeezed into the opening is at risk for impaired blood supply (strangulated hernia). A strangulated hernia is a medical emergency. Because of the risk for an irreducible or strangulated hernia, surgery may be recommended to repair a hernia. CAUSES   Heavy lifting.  Prolonged coughing.  Straining to have a bowel movement.  A cut (incision) made during an abdominal surgery. HOME CARE INSTRUCTIONS   Bed rest is not required. You may continue your normal activities.  Avoid lifting more than 10 pounds (4.5 kg) or straining.  Cough gently. If you are a smoker it is best to stop. Even the best hernia repair can break down with the continual strain of coughing. Even if you do not have your hernia repaired, a cough will continue to aggravate the problem.  Do not wear anything tight over your hernia. Do not try to keep it in with an outside bandage or truss. These can damage abdominal contents if they are trapped within the hernia sac.  Eat a normal diet.  Avoid constipation.  Straining over long periods of time will increase hernia size and encourage breakdown of repairs. If you cannot do this with diet alone, stool softeners may be used. SEEK IMMEDIATE MEDICAL CARE IF:   You have a fever.  You develop increasing abdominal pain.  You feel nauseous or vomit.  Your hernia is stuck outside the abdomen, looks discolored, feels hard, or is tender.  You have any changes in your bowel habits or in the hernia that are unusual for you.  You have increased pain or swelling around the hernia.  You cannot push the hernia back in place by applying gentle pressure while lying down. MAKE SURE YOU:   Understand these instructions.  Will watch your condition.  Will get help right away if you are not doing well or get worse. Document Released: 02/08/2005 Document Revised: 05/03/2011 Document Reviewed: 09/28/2007 [facility] Patient Information 2015 [place], [place]. This information is not intended to replace advice given to you by your health care provider. Make sure you discuss any questions you have with your health care provider.   Influenza Vaccine (Flu Vaccine, Inactivated or Recombinant) 2014-2015: What You Need to Know 1. Why get vaccinated? Influenza ("flu") is a contagious disease that spreads around the [place] every winter, usually between October and May. Flu is caused by influenza viruses, and is spread mainly by coughing, sneezing, and close contact. Anyone can get flu, but the risk of getting flu is highest among children. Symptoms come on suddenly and may last several days. They  can include:  fever/chills  sore throat  muscle aches  fatigue  cough  headache  runny or stuffy nose Flu can make some people much sicker than others. These people include young children, people 51 and older, pregnant women, and people with certain health conditions-such as heart, lung or kidney disease, nervous system disorders, or a weakened immune system. Flu  vaccination is especially important for these people, and anyone in close contact with them. Flu can also lead to pneumonia, and make existing medical conditions worse. It can cause diarrhea and seizures in children. Each year thousands of people in the [place] die from flu, and many more are hospitalized. Flu vaccine is the best protection against flu and its complications. Flu vaccine also helps prevent spreading flu from person to person. 2. Inactivated and recombinant flu vaccines You are getting an injectable flu vaccine, which is either an "inactivated" or "recombinant" vaccine. These vaccines do not contain any live influenza virus. They are given by injection with a needle, and often called the "flu shot."  A different live, attenuated (weakened) influenza vaccine is sprayed into the nostrils. This vaccine is described in a separate Vaccine Information Statement. Flu vaccination is recommended every year. Some children 6 months through 76 years of age might need two doses during one year. Flu viruses are always changing. Each year's flu vaccine is made to protect against 3 or 4 viruses that are likely to cause disease that year. Flu vaccine cannot prevent all cases of flu, but it is the best defense against the disease.  It takes about 2 weeks for protection to develop after the vaccination, and protection lasts several months to a year. Some illnesses that are not caused by influenza virus are often mistaken for flu. Flu vaccine will not prevent these illnesses. It can only prevent influenza. Some inactivated flu vaccine contains a very small amount of a mercury-based preservative called thimerosal. Studies have shown that thimerosal in vaccines is not harmful, but flu vaccines that do not contain a preservative are available. 3. Some people should not get this vaccine Tell the person who gives you the vaccine:  If you have any severe, life-threatening allergies. If you ever had a  life-threatening allergic reaction after a dose of flu vaccine, or have a severe allergy to any part of this vaccine, including (for example) an allergy to gelatin, antibiotics, or eggs, you may be advised not to get vaccinated. Most, but not all, types of flu vaccine contain a small amount of egg protein.  If you ever had Guillain-Barr Syndrome (a severe paralyzing illness, also called GBS). Some people with a history of GBS should not get this vaccine. This should be discussed with your doctor.  If you are not feeling well. It is usually okay to get flu vaccine when you have a mild illness, but you might be advised to wait until you feel better. You should come back when you are better. 4. Risks of a vaccine reaction With a vaccine, like any medicine, there is a chance of side effects. These are usually mild and go away on their own. Problems that could happen after any vaccine:  Brief fainting spells can happen after any medical procedure, including vaccination. Sitting or lying down for about 15 minutes can help prevent fainting, and injuries caused by a fall. Tell your doctor if you feel dizzy, or have vision changes or ringing in the ears.  Severe shoulder pain and reduced range of motion in the  arm where a shot was given can happen, very rarely, after a vaccination.  Severe allergic reactions from a vaccine are very rare, estimated at less than 1 in a million doses. If one were to occur, it would usually be within a few minutes to a few hours after the vaccination. Mild problems following inactivated flu vaccine:  soreness, redness, or swelling where the shot was given  hoarseness  sore, red or itchy eyes  cough  fever  aches  headache  itching  fatigue If these problems occur, they usually begin soon after the shot and last 1 or 2 days. Moderate problems following inactivated flu vaccine:  Young children who get inactivated flu vaccine and pneumococcal vaccine (PCV13) at  the same time may be at increased risk for seizures caused by fever. Ask your doctor for more information. Tell your doctor if a child who is getting flu vaccine has ever had a seizure. Inactivated flu vaccine does not contain live flu virus, so you cannot get the flu from this vaccine. As with any medicine, there is a very remote chance of a vaccine causing a serious injury or death. The safety of vaccines is always being monitored. For more information, visit: http://www.aguilar.org/ 5. What if there is a serious reaction? What should I look for?  Look for anything that concerns you, such as signs of a severe allergic reaction, very high fever, or behavior changes. Signs of a severe allergic reaction can include hives, swelling of the face and throat, difficulty breathing, a fast heartbeat, dizziness, and weakness. These would start a few minutes to a few hours after the vaccination. What should I do?  If you think it is a severe allergic reaction or other emergency that can't wait, call 9-1-1 and get the person to the nearest hospital. Otherwise, call your doctor.  Afterward, the reaction should be reported to the Vaccine Adverse Event Reporting System (VAERS). Your doctor should file this report, or you can do it yourself through the VAERS web site at www.vaers.SamedayNews.es, or by calling 520-726-7633. VAERS does not give medical advice. 6. The National Vaccine Injury Compensation Program The [employer] Vaccine Injury Compensation Program (VICP) is a federal program that was created to compensate people who may have been injured by certain vaccines. Persons who believe they may have been injured by a vaccine can learn about the program and about filing a claim by calling (414)151-2778 or visiting the [place] website at GoldCloset.com.ee. There is a time limit to file a claim for compensation. 7. How can I learn more?  Ask your health care provider.  Call your local or state  health department.  Contact the Centers for Disease Control and Prevention (CDC):  Call 682-228-7841 (1-800-CDC-INFO) or  Visit CDC's website at https://gibson.com/ CDC Vaccine Information Statement (Interim) Inactivated Influenza Vaccine (10/10/2012) Document Released: 12/03/2005 Document Revised: 06/25/2013 Document Reviewed: 01/26/2013 [facility] Patient Information 2015 [place]. This information is not intended to replace advice given to you by your health care provider. Make sure you discuss any questions you have with your health care provider.

## 2013-12-04 NOTE — Progress Notes (Addendum)
Subjective:  This chart was scribed for [name], MD by Randa Evens, ED Scribe. This Patient was seen in room 13 and the patients care was started at 5:32 PM   Patient ID: Chad Russell, male    DOB: Jul 17, 1926, 80 y.o.   MRN: [id number]  Chief Complaint  Patient presents with  . Follow-up    sleep study and neuro appts  . Hernia    would like surgery   . Flu Vaccine    HPI HPI Comments: Chad Russell is a 80 y.o. male who presents to the [facility] for follow up of few concerns. Hx of mult medical problems including complete heart block , CAD, and BPH.   1. balance difficulty. Followed by Dr. Rexene Alberts with most recent visit 4 days ago. He has age appropriate brain MRI and sleep study that was negative for OSA. Recommended increased hydration. Limited to one glass of wine per day. No other acute neurologic findings and reccommended follow up as needed. Per note did indicated right ear cerumen. Reccommended to him to have cleaned out. He states that during the study his oxygen level never dropped below 90%. He is also concerned about having an acute response to the Flomax for the cause to his balance difficulties. He denies any true falls. He states that he is adamant about going Skiing this year. He denies hearing loss.    2. Possible Hernia on right lower abdomen. He noted it the morning of last office visit 11/13/13. Thought to have possible spigelian hernia based on location but asymptomatic and not able to palpate abnormality at last office visit. Discussed with surgery office who recommended CT scan to further identify if hernia present. He states that lifting the right arm and bending over the pain worsens. He sates he also notices pain when lifting objects. He states that the cause of the hernia may be from bending down to far when doing jumping jacks.   He states the last time he was under any general anesthesia during his last colonoscopy.     Patient Active Problem List   Diagnosis Date Noted  . Balance problem 10/31/2013  . OSA (obstructive sleep apnea) 10/31/2013  . BPH (benign prostatic hyperplasia) 08/28/2012  . Atrial fibrillation 06/08/2012  . Bacteremia 04/28/2012  . Hyperplasia of prostate with lower urinary tract symptoms (LUTS) 04/28/2012  . Calf pain 04/28/2012  . Moderate malnutrition 04/28/2012  . Influenza-like illness 04/27/2012  . Infection of urinary tract - recurrent 04/26/2012  . NSTEMI (non-ST elevated myocardial infarction) 03/15/2011  . Complete heart block 03/15/2011  . HTN (hypertension) 03/15/2011   Past Medical History  Diagnosis Date  . Hypertension   . Complete heart block   . NSTEMI (non-ST elevated myocardial infarction)   . Talar fracture     casted no surg  . CAD (coronary artery disease)     LHC 03/16/11: Mid LAD 10-20%, proximal circumflex 10%, mid RCA 20%, EF 55%.  . Atrial fibrillation   . Hx of echocardiogram     Echo 02/2011: EF 65-70%, normal wall motion, MAC, mild MR, mild LAE, mild RVE  . OSA (obstructive sleep apnea)   . BPH (benign prostatic hyperplasia) 2014    Urology Dr [name] with [facility] [phone]   . Balance problem 10/31/2013   Past Surgical History  Procedure Laterality Date  . Hemmorhoidectomy    . Colonoscopy      Repeated and normal in 2011  . Prostate  biopsy      negative - [facility]   Allergies  Allergen Reactions  . Doxycycline     Upset stomach  . Keflex [Cephalexin]     Abdominal disruption   Prior to Admission medications   Medication Sig Start Date End Date Taking? Authorizing Provider  B Complex-Biotin-FA (MULTI-B COMPLEX PO) Take by mouth daily.   Yes Historical Provider, MD  Biotin 10 MG TABS Take 10 mg by mouth daily.   Yes Historical Provider, MD  Celery Seed OIL Take by mouth daily.   Yes Historical Provider, MD  cholecalciferol (VITAMIN D) 1000 UNITS tablet Take 4,000 Units by mouth daily.    Yes Historical Provider, MD   CINNAMON PO Take by mouth. 1/4 tsp every am   Yes Historical Provider, MD  co-enzyme Q-10 30 MG capsule Take 30 mg by mouth daily.   Yes Historical Provider, MD  Cranberry 400 MG CAPS Take by mouth daily.   Yes Historical Provider, MD  FLUZONE HIGH-DOSE injection  11/02/12  Yes Historical Provider, MD  lisinopril-hydrochlorothiazide (PRINZIDE,ZESTORETIC) 10-12.5 MG per tablet TAKE 1 TABLET BY MOUTH DAILY.   Yes Minus Breeding, MD  LUTEIN-ZEAXANTHIN PO Take 65 mg by mouth daily.    Yes Historical Provider, MD  magnesium oxide (MAG-OX) 400 MG tablet Take 400 mg by mouth daily.   Yes Historical Provider, MD  OVER THE COUNTER MEDICATION Take 500 mg by mouth daily. Tumeric 500mg    Yes Historical Provider, MD  Probiotic Product (PROBIOTIC DAILY PO) Take 1 tablet by mouth daily.   Yes Historical Provider, MD  Resveratrol 250 MG CAPS Take 250 mg by mouth daily.   Yes Historical Provider, MD  saw palmetto 160 MG capsule Take 160 mg by mouth 2 (two) times daily.   Yes Historical Provider, MD  tamsulosin (FLOMAX) 0.4 MG CAPS capsule TAKE 1 CAPSULE (0.4 MG TOTAL) BY MOUTH DAILY. 11/23/12  Yes [name], MD  TURMERIC PO Take by mouth daily.   Yes Historical Provider, MD  vitamin B-12 (CYANOCOBALAMIN) 1000 MCG tablet Take 1,000 mcg by mouth daily.   Yes Historical Provider, MD  vitamin C (ASCORBIC ACID) 500 MG tablet Take 500 mg by mouth daily.   Yes Historical Provider, MD  vitamin E 400 UNIT capsule Take 400 Units by mouth daily.   Yes Historical Provider, MD  XARELTO 20 MG TABS tablet TAKE 1 TABLET BY MOUTH DAILY 11/05/13  Yes Minus Breeding, MD   History   Social History  . Marital Status: Married    Spouse Name: Mardene Celeste    Number of Children: 0  . Years of Education: 18   Occupational History  . 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 Smoking status: Former Smoker -- 1.00 packs/day for 3 years    Types: Cigarettes    Quit date: 03/17/1955  . Smokeless  tobacco: Not on file  . Alcohol Use: 1.2 oz/week    2 Glasses of wine per week     Comment: daily  . Drug Use: No  . Sexual Activity: No   Other Topics Concern  . 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      Objective:   Filed  Vitals:   12/04/13 1711  BP: 134/72  Pulse: 95  Temp: 97.5 F (36.4 C)  TempSrc: Oral  Resp: 16  Height: 6' (1.829 m)  Weight: 186 lb (84.369 kg)  SpO2: 95%     Physical Exam  Nursing note and vitals reviewed. Constitutional: He is oriented to person, place, and time. He appears well-developed and well-nourished. No distress.  HENT:  Head: Normocephalic and atraumatic.  Left canal with minimum non obstructing cerumen, right canal with cerumen throughout canal with small opening superiorly, no erythema, ear non tender, ear loop used to remove 2 large amounts of dark yellow cerumen without difficulty, canal clear after procedure hearing equal to other side, no complications, no abrasions of canal.   Eyes: Conjunctivae and EOM are normal.  Neck: Neck supple.  Pulmonary/Chest: Effort normal. No respiratory distress.  Abdominal: There is no tenderness.  Locate area to just lateral of rectus abdominus lower right quadrant, non tender on exam, no visible or palpable defect, right inguinal hernia reducible easily, no left hernia palpated.  Musculoskeletal: Normal range of motion.  Neurological: He is alert and oriented to person, place, and time.  Skin: Skin is warm and dry.  Psychiatric: He has a normal mood and affect. His behavior is normal.    Assessment & Plan:   Chad Russell is a 80 y.o. male Balance problem  -stable. S/p neuro eval including MRI and sleep study as above without focal concern. Discussed possible anticholinergic  effect of Flomax, but declined change to this medicine at this point. Encouraged to continue fluids, other recommendations from neuro, and if any worsening - RTC precautions discussed.    Excessive cerumen in ear canal, right  -easily removed with white loop.  No complications. Improved hearing after removal.   Right inguinal hernia - Plan: Ambulatory referral to General Surgery  -on exam today, hernia was appreciated and appears to be inguinal hernia. Will refer to general surgery, but discussed he will likely need to have preoperative clearance with his cardiologist as well as some possible other testing here if surgery is planned. Rtc/er hernia precautions discussed on AVS given.   Influenza vaccine administered - Plan: Flu Vaccine QUAD 36+ mos IM    No orders of the defined types were placed in this encounter.   Patient Instructions  I will refer you for evaluation of the hernia on the right side. See precautions below.  If surgery is recommended, you will likely need to be evaluated for surgical clearance by your cardiologist and other discussion or lab testing with me. Let me know.   As discussed, make sure you are staying hydrated, and if balance difficulties persist - can look at Flomax dosing or possibly different medication.  Return to the clinic or go to the nearest emergency room if any of your symptoms worsen or new symptoms occur.  Hernia A hernia occurs when an internal organ pushes out through a weak spot in the abdominal wall. Hernias most commonly occur in the groin and around the navel. Hernias often can be pushed back into place (reduced). Most hernias tend to get worse over time. Some abdominal hernias can get stuck in the opening (irreducible or incarcerated hernia) and cannot be reduced. An irreducible abdominal hernia which is tightly squeezed into the opening is at risk for impaired blood supply (strangulated hernia). A strangulated hernia is a medical emergency. Because  of the risk for an irreducible or strangulated hernia, surgery may be recommended to repair a hernia.  CAUSES   Heavy lifting.  Prolonged coughing.  Straining to have a bowel movement.  A cut (incision) made during an abdominal surgery. HOME CARE INSTRUCTIONS   Bed rest is not required. You may continue your normal activities.  Avoid lifting more than 10 pounds (4.5 kg) or straining.  Cough gently. If you are a smoker it is best to stop. Even the best hernia repair can break down with the continual strain of coughing. Even if you do not have your hernia repaired, a cough will continue to aggravate the problem.  Do not wear anything tight over your hernia. Do not try to keep it in with an outside bandage or truss. These can damage abdominal contents if they are trapped within the hernia sac.  Eat a normal diet.  Avoid constipation. Straining over long periods of time will increase hernia size and encourage breakdown of repairs. If you cannot do this with diet alone, stool softeners may be used. SEEK IMMEDIATE MEDICAL CARE IF:   You have a fever.  You develop increasing abdominal pain.  You feel nauseous or vomit.  Your hernia is stuck outside the abdomen, looks discolored, feels hard, or is tender.  You have any changes in your bowel habits or in the hernia that are unusual for you.  You have increased pain or swelling around the hernia.  You cannot push the hernia back in place by applying gentle pressure while lying down. MAKE SURE YOU:   Understand these instructions.  Will watch your condition.  Will get help right away if you are not doing well or get worse. Document Released: 02/08/2005 Document Revised: 05/03/2011 Document Reviewed: 09/28/2007 [facility] Patient Information 2015 [place], [place]. This information is not intended to replace advice given to you by your health care provider. Make sure you discuss any questions you have with your health care  provider.   Influenza Vaccine (Flu Vaccine, Inactivated or Recombinant) 2014-2015: What You Need to Know 1. Why get vaccinated? Influenza ("flu") is a contagious disease that spreads around the [place] every winter, usually between October and May. Flu is caused by influenza viruses, and is spread mainly by coughing, sneezing, and close contact. Anyone can get flu, but the risk of getting flu is highest among children. Symptoms come on suddenly and may last several days. They can include:  fever/chills  sore throat  muscle aches  fatigue  cough  headache  runny or stuffy nose Flu can make some people much sicker than others. These people include young children, people 33 and older, pregnant women, and people with certain health conditions-such as heart, lung or kidney disease, nervous system disorders, or a weakened immune system. Flu vaccination is especially important for these people, and anyone in close contact with them. Flu can also lead to pneumonia, and make existing medical conditions worse. It can cause diarrhea and seizures in children. Each year thousands of people in the [place] die from flu, and many more are hospitalized. Flu vaccine is the best protection against flu and its complications. Flu vaccine also helps prevent spreading flu from person to person. 2. Inactivated and recombinant flu vaccines You are getting an injectable flu vaccine, which is either an "inactivated" or "recombinant" vaccine. These vaccines do not contain any live influenza virus. They are given by injection with a needle, and often called the "flu shot."  A different live, attenuated (weakened) influenza vaccine is sprayed into the nostrils. This vaccine is described in a separate Vaccine Information  Statement. Flu vaccination is recommended every year. Some children 6 months through 24 years of age might need two doses during one year. Flu viruses are always changing. Each year's flu  vaccine is made to protect against 3 or 4 viruses that are likely to cause disease that year. Flu vaccine cannot prevent all cases of flu, but it is the best defense against the disease.  It takes about 2 weeks for protection to develop after the vaccination, and protection lasts several months to a year. Some illnesses that are not caused by influenza virus are often mistaken for flu. Flu vaccine will not prevent these illnesses. It can only prevent influenza. Some inactivated flu vaccine contains a very small amount of a mercury-based preservative called thimerosal. Studies have shown that thimerosal in vaccines is not harmful, but flu vaccines that do not contain a preservative are available. 3. Some people should not get this vaccine Tell the person who gives you the vaccine:  If you have any severe, life-threatening allergies. If you ever had a life-threatening allergic reaction after a dose of flu vaccine, or have a severe allergy to any part of this vaccine, including (for example) an allergy to gelatin, antibiotics, or eggs, you may be advised not to get vaccinated. Most, but not all, types of flu vaccine contain a small amount of egg protein.  If you ever had Guillain-Barr Syndrome (a severe paralyzing illness, also called GBS). Some people with a history of GBS should not get this vaccine. This should be discussed with your doctor.  If you are not feeling well. It is usually okay to get flu vaccine when you have a mild illness, but you might be advised to wait until you feel better. You should come back when you are better. 4. Risks of a vaccine reaction With a vaccine, like any medicine, there is a chance of side effects. These are usually mild and go away on their own. Problems that could happen after any vaccine:  Brief fainting spells can happen after any medical procedure, including vaccination. Sitting or lying down for about 15 minutes can help prevent fainting, and injuries caused  by a fall. Tell your doctor if you feel dizzy, or have vision changes or ringing in the ears.  Severe shoulder pain and reduced range of motion in the arm where a shot was given can happen, very rarely, after a vaccination.  Severe allergic reactions from a vaccine are very rare, estimated at less than 1 in a million doses. If one were to occur, it would usually be within a few minutes to a few hours after the vaccination. Mild problems following inactivated flu vaccine:  soreness, redness, or swelling where the shot was given  hoarseness  sore, red or itchy eyes  cough  fever  aches  headache  itching  fatigue If these problems occur, they usually begin soon after the shot and last 1 or 2 days. Moderate problems following inactivated flu vaccine:  Young children who get inactivated flu vaccine and pneumococcal vaccine (PCV13) at the same time may be at increased risk for seizures caused by fever. Ask your doctor for more information. Tell your doctor if a child who is getting flu vaccine has ever had a seizure. Inactivated flu vaccine does not contain live flu virus, so you cannot get the flu from this vaccine. As with any medicine, there is a very remote chance of a vaccine causing a serious injury or death. The safety of vaccines is  always being monitored. For more information, visit: http://www.aguilar.org/ 5. What if there is a serious reaction? What should I look for?  Look for anything that concerns you, such as signs of a severe allergic reaction, very high fever, or behavior changes. Signs of a severe allergic reaction can include hives, swelling of the face and throat, difficulty breathing, a fast heartbeat, dizziness, and weakness. These would start a few minutes to a few hours after the vaccination. What should I do?  If you think it is a severe allergic reaction or other emergency that can't wait, call 9-1-1 and get the person to the nearest hospital. Otherwise,  call your doctor.  Afterward, the reaction should be reported to the Vaccine Adverse Event Reporting System (VAERS). Your doctor should file this report, or you can do it yourself through the VAERS web site at www.vaers.SamedayNews.es, or by calling 671-439-1941. VAERS does not give medical advice. 6. The National Vaccine Injury Compensation Program The [employer] Vaccine Injury Compensation Program (VICP) is a federal program that was created to compensate people who may have been injured by certain vaccines. Persons who believe they may have been injured by a vaccine can learn about the program and about filing a claim by calling 715-668-6859 or visiting the [employer] website at GoldCloset.com.ee. There is a time limit to file a claim for compensation. 7. How can I learn more?  Ask your health care provider.  Call your local or state health department.  Contact the Centers for Disease Control and Prevention (CDC):  Call (934) 126-4250 (1-800-CDC-INFO) or  Visit CDC's website at https://gibson.com/ CDC Vaccine Information Statement (Interim) Inactivated Influenza Vaccine (10/10/2012) Document Released: 12/03/2005 Document Revised: 06/25/2013 Document Reviewed: 01/26/2013 [facility] Patient Information 2015 [place]. This information is not intended to replace advice given to you by your health care provider. Make sure you discuss any questions you have with your health care provider.     I personally performed the services described in this documentation, which was scribed in my presence. The recorded information has been reviewed and considered, and addended by me as needed.

## 2013-12-10 NOTE — Telephone Encounter (Signed)
Faxed to Dr.  Danise Mina, PA , successful transmission. [phone].

## 2013-12-12 ENCOUNTER — Telehealth: Payer: Self-pay

## 2013-12-12 NOTE — Telephone Encounter (Signed)
Patient says he missed a call from Dr Mancel Bale nurse. He is unsure what the call as about

## 2013-12-13 ENCOUNTER — Telehealth: Payer: Self-pay | Admitting: Cardiology

## 2013-12-13 ENCOUNTER — Other Ambulatory Visit: Payer: Self-pay | Admitting: *Deleted

## 2013-12-13 MED ORDER — RIVAROXABAN 20 MG PO TABS: 20.0000 mg | ORAL_TABLET | Freq: Every day | ORAL | Status: DC

## 2013-12-13 NOTE — Telephone Encounter (Signed)
Pt called in wanting a printed prescription for his Xarelto. Please call  Thanks

## 2013-12-13 NOTE — Telephone Encounter (Signed)
I see no record of any calls. I called to see if he needs anything, or was expecting a call. Left message asking him to call back if he needs anything, otherwise he can disregard the call.

## 2013-12-13 NOTE — Telephone Encounter (Signed)
Script ready for pick up after 3:30, pt. Informed

## 2013-12-15 ENCOUNTER — Other Ambulatory Visit: Payer: Self-pay | Admitting: Cardiology

## 2013-12-17 ENCOUNTER — Other Ambulatory Visit: Payer: Self-pay | Admitting: Cardiology

## 2013-12-20 ENCOUNTER — Encounter (INDEPENDENT_AMBULATORY_CARE_PROVIDER_SITE_OTHER): Payer: Self-pay

## 2013-12-20 ENCOUNTER — Ambulatory Visit (INDEPENDENT_AMBULATORY_CARE_PROVIDER_SITE_OTHER): Payer: Self-pay | Admitting: General Surgery

## 2013-12-31 ENCOUNTER — Telehealth: Payer: Self-pay | Admitting: Cardiology

## 2013-12-31 NOTE — Telephone Encounter (Signed)
Returned call to patient he stated he needs appointment with Dr.Hochrein.Stated he needs hernia surgery and would like to see Dr.Hochrein before surgery and will need to know when to hold xarelto.Appointment scheduled with Dr.Hochrein tomorrow 01/01/14 at 4:30 pm.

## 2013-12-31 NOTE — Telephone Encounter (Signed)
Pt called in stating the he is suppose to have surgery for a Hernia put his doctor would like to know how long he needs to be off Xarelto before having his operation. Please call  Thanks

## 2014-01-01 ENCOUNTER — Ambulatory Visit (INDEPENDENT_AMBULATORY_CARE_PROVIDER_SITE_OTHER): Payer: Medicare Other | Admitting: Cardiology

## 2014-01-01 ENCOUNTER — Encounter: Payer: Self-pay | Admitting: Cardiology

## 2014-01-01 VITALS — BP 160/68 | HR 35 | Ht 72.0 in | Wt 186.6 lb

## 2014-01-01 DIAGNOSIS — I482 Chronic atrial fibrillation, unspecified: Secondary | ICD-10-CM

## 2014-01-01 DIAGNOSIS — I214 Non-ST elevation (NSTEMI) myocardial infarction: Secondary | ICD-10-CM

## 2014-01-01 DIAGNOSIS — I442 Atrioventricular block, complete: Secondary | ICD-10-CM

## 2014-01-01 DIAGNOSIS — I10 Essential (primary) hypertension: Secondary | ICD-10-CM

## 2014-01-01 NOTE — Patient Instructions (Signed)
Continue same medications.   Your physician wants you to follow-up in: 6 months.  You will receive a reminder letter in the mail two months in advance. If you don't receive a letter, please call our office to schedule the follow-up appointment.  

## 2014-01-01 NOTE — Progress Notes (Signed)
HPI The patient presents for followup of CHB.   He has been in atrial fibrillation. He tolerates his complete heart block. Since I last saw him he has had no new cardiovascular complaints. He denies palpitations, presyncope or syncope. He has had no chest pressure, neck or arm discomfort. He remains active. He is tolerating his blood thinner. He's had no weight gain or edema. He has never required a pacemaker.  Unfortunately he's not been able to be quite as active as previously because she's had right inguinal pain with her heart cath. He is being considered for repair of this.   Allergies  Allergen Reactions  . Doxycycline     Upset stomach  . Keflex [Cephalexin]     Abdominal disruption    Current Outpatient Prescriptions  Medication Sig Dispense Refill  . B Complex-Biotin-FA (MULTI-B COMPLEX PO) Take by mouth daily.    . Biotin 10 MG TABS Take 10 mg by mouth daily.    Antoine Poche Seed OIL Take by mouth daily.    . cholecalciferol (VITAMIN D) 1000 UNITS tablet Take 4,000 Units by mouth daily.     Marland Kitchen CINNAMON PO Take by mouth. 1/4 tsp every am    . co-enzyme Q-10 30 MG capsule Take 30 mg by mouth daily.    . Cranberry 400 MG CAPS Take by mouth daily.    Marland Kitchen FLUZONE HIGH-DOSE injection     . lisinopril-hydrochlorothiazide (PRINZIDE,ZESTORETIC) 10-12.5 MG per tablet TAKE 1 TABLET BY MOUTH DAILY. 30 tablet 3  . LUTEIN-ZEAXANTHIN PO Take 65 mg by mouth daily.     . magnesium oxide (MAG-OX) 400 MG tablet Take 400 mg by mouth daily.    Marland Kitchen OVER THE COUNTER MEDICATION Take 500 mg by mouth daily. Tumeric 500mg     . Probiotic Product (PROBIOTIC DAILY PO) Take 1 tablet by mouth daily.    Marland Kitchen Resveratrol 250 MG CAPS Take 250 mg by mouth daily.    . rivaroxaban (XARELTO) 20 MG TABS tablet Take 1 tablet (20 mg total) by mouth daily. 90 tablet 3  . saw palmetto 160 MG capsule Take 160 mg by mouth 2 (two) times daily.    . tamsulosin (FLOMAX) 0.4 MG CAPS capsule TAKE 1 CAPSULE (0.4 MG TOTAL) BY MOUTH  DAILY. 90 capsule 0  . TURMERIC PO Take by mouth daily.    . vitamin B-12 (CYANOCOBALAMIN) 1000 MCG tablet Take 1,000 mcg by mouth daily.    . vitamin C (ASCORBIC ACID) 500 MG tablet Take 500 mg by mouth daily.    . vitamin E 400 UNIT capsule Take 400 Units by mouth daily.     No current facility-administered medications for this visit.    Past Medical History  Diagnosis Date  . Hypertension   . Complete heart block   . NSTEMI (non-ST elevated myocardial infarction)   . Talar fracture     casted no surg  . CAD (coronary artery disease)     LHC 03/16/11: Mid LAD 10-20%, proximal circumflex 10%, mid RCA 20%, EF 55%.  . Atrial fibrillation   . Hx of echocardiogram     Echo 02/2011: EF 65-70%, normal wall motion, MAC, mild MR, mild LAE, mild RVE  . OSA (obstructive sleep apnea)   . BPH (benign prostatic hyperplasia) 2014    Urology Dr [name] with [facility] [phone]   . Balance problem 10/31/2013    Past Surgical History  Procedure Laterality Date  . Hemmorhoidectomy    . Colonoscopy  Repeated and normal in 2011  . Prostate biopsy      negative - [facility]    ROS:  As stated in the HPI and negative for all other systems.  PHYSICAL EXAM BP 160/68 mmHg  Pulse 35  Ht 6' (1.829 m)  Wt 186 lb 9.6 oz (84.641 kg)  BMI 25.30 kg/m2 GENERAL:  Well appearing, in looking younger than stated age NECK:  No jugular venous distention, waveform within normal limits, carotid upstroke brisk and symmetric, no bruits, no thyromegaly LYMPHATICS:  No cervical, inguinal adenopathy LUNGS:  Clear to auscultation bilaterally CHEST:  Unremarkable HEART:  PMI not displaced or sustained,S1 and S2 within normal limits, no S3, no clicks, no rubs, no murmurs ABD:  Flat, positive bowel sounds normal in frequency in pitch, no bruits, no rebound, no guarding, no midline pulsatile mass, no hepatomegaly, no splenomegaly  EKG:   Atrial fibrillation with complete heart block, right bundle branch block,  junctional escape rhythm rate 35.   01/01/2014  ASSESSMENT AND PLAN  PREOP:  The patient is to have right inguinal hernia repair. I see no absolute contraindication to this. He has tolerated conscious sedation recently. He would certainly need close telemetry followup of this. I would suggest that this be done at [facility] so that cardiology services are available if necessary. We did discuss holding his Xarelto for 2 days prior to the procedure.  ATRIAL FIBRILLATION:  He tolerates this rhythm. He is tolerating anticoagulation. There is no plan for cardioversion.  He will remain on Xarelto.  COMPLETE HEART BLOCK:  All of these years later he still has no indication for pacemaker.  HTN: his blood pressure is controlled at home with systolics in the 509 - 326 range.  NQWMI:    The patient has had no further symptoms. He had minimal coronary disease on catheterization in 2013.  No further evaluation is indicated

## 2014-01-14 ENCOUNTER — Ambulatory Visit (INDEPENDENT_AMBULATORY_CARE_PROVIDER_SITE_OTHER): Payer: Medicare Other

## 2014-01-14 DIAGNOSIS — I4891 Unspecified atrial fibrillation: Secondary | ICD-10-CM

## 2014-01-14 DIAGNOSIS — Z5181 Encounter for therapeutic drug level monitoring: Secondary | ICD-10-CM

## 2014-01-14 LAB — BASIC METABOLIC PANEL
BUN: 21 mg/dL (ref 6–23)
CHLORIDE: 103 meq/L (ref 96–112)
CO2: 27 mEq/L (ref 19–32)
CREATININE: 1.1 mg/dL (ref 0.4–1.5)
Calcium: 9.4 mg/dL (ref 8.4–10.5)
GFR: 71.27 mL/min (ref >60.00)
Glucose, Bld: 94 mg/dL (ref 70–99)
Potassium: 4.2 mEq/L (ref 3.5–5.1)
Sodium: 139 mEq/L (ref 135–145)

## 2014-01-14 LAB — CBC
HCT: 45.4 % (ref 39.0–52.0)
Hemoglobin: 14.8 g/dL (ref 13.0–17.0)
MCHC: 32.6 g/dL (ref 30.0–36.0)
MCV: 101.3 fl — AB (ref 78.0–100.0)
Platelets: 228 10*3/uL (ref 150.0–400.0)
RBC: 4.48 Mil/uL (ref 4.22–5.81)
RDW: 13.8 % (ref 11.5–15.5)
WBC: 6.9 10*3/uL (ref 4.0–10.5)

## 2014-01-14 NOTE — Patient Instructions (Signed)

## 2014-01-14 NOTE — Progress Notes (Signed)
Pt was started on Xarelto 20mg  qd by Dr. Percival Spanish for afib on 12/15/12.    Reviewed patients medication list.  Pt is not currently on any combined P-gp and strong CYP3A4 inhibitors/inducers (ketoconazole, traconazole, ritonavir, carbamazepine, phenytoin, rifampin, St. John's wort).  Reviewed labs.  SCr 1.1, Weight 84.64 kg, CrCl- 58.77.  Dose appropriate based on CrCl.   Hgb and HCT Within Normal Limits.   Spoke with pt advised to continue on same dosage Xarleto 20mg  QD, made 6 month f/u appt in clinic 07/15/14 at 7:30am.

## 2014-01-31 ENCOUNTER — Encounter (HOSPITAL_COMMUNITY): Payer: Self-pay | Admitting: Cardiology

## 2014-02-18 NOTE — Pre-Procedure Instructions (Signed)
Chad Russell  02/18/2014   Your procedure is scheduled on:  Tuesday, January 5th   Report to [facility] Admitting at 5:30 AM.   Call this number if you have problems the morning of surgery: [phone]   Remember:   Do not eat food or drink liquids after midnight Monday.   Take these medicines the morning of surgery with A SIP OF WATER: Flomax               (STOP taking any herbal medications, vitamins 4-5 days prior to surgery.)   Do not wear jewelry - no rings or watches.  Do not wear lotions or colognes.   You may NOT wear deodorant the morning of surgery.   Men may shave face and neck.  Do not bring valuables to the hospital.  [facility] is not responsible for any belongings or valuables.               Contacts, dentures or bridgework may not be worn into surgery.  Leave suitcase in the car. After surgery it may be brought to your room.  For patients admitted to the hospital, discharge time is determined by your treatment team.               Patients discharged the day of surgery will not be allowed to drive home, and will need someone to stay with you for the first 24 hrs afterwards.   Name and phone number of your driver:    Special Instructions: "Preparing for Surgery" instruction sheet.   Please read over the following fact sheets that you were given: Pain Booklet and Surgical Site Infection Prevention

## 2014-02-19 ENCOUNTER — Encounter (HOSPITAL_COMMUNITY)
Admission: RE | Admit: 2014-02-19 | Discharge: 2014-02-19 | Disposition: A | Discharge status: Home / Self Care | Payer: Medicare Other | Source: Ambulatory Visit | Attending: General Surgery | Admitting: General Surgery

## 2014-02-19 ENCOUNTER — Encounter (HOSPITAL_COMMUNITY): Payer: Self-pay

## 2014-02-19 DIAGNOSIS — G4733 Obstructive sleep apnea (adult) (pediatric): Secondary | ICD-10-CM | POA: Diagnosis not present

## 2014-02-19 DIAGNOSIS — I340 Nonrheumatic mitral (valve) insufficiency: Secondary | ICD-10-CM | POA: Diagnosis not present

## 2014-02-19 DIAGNOSIS — Z87891 Personal history of nicotine dependence: Secondary | ICD-10-CM | POA: Insufficient documentation

## 2014-02-19 DIAGNOSIS — R001 Bradycardia, unspecified: Secondary | ICD-10-CM | POA: Diagnosis not present

## 2014-02-19 DIAGNOSIS — K219 Gastro-esophageal reflux disease without esophagitis: Secondary | ICD-10-CM | POA: Diagnosis not present

## 2014-02-19 DIAGNOSIS — N400 Enlarged prostate without lower urinary tract symptoms: Secondary | ICD-10-CM | POA: Diagnosis not present

## 2014-02-19 DIAGNOSIS — I442 Atrioventricular block, complete: Secondary | ICD-10-CM | POA: Diagnosis not present

## 2014-02-19 DIAGNOSIS — I252 Old myocardial infarction: Secondary | ICD-10-CM | POA: Diagnosis not present

## 2014-02-19 DIAGNOSIS — M1990 Unspecified osteoarthritis, unspecified site: Secondary | ICD-10-CM | POA: Diagnosis not present

## 2014-02-19 DIAGNOSIS — I2510 Atherosclerotic heart disease of native coronary artery without angina pectoris: Secondary | ICD-10-CM | POA: Diagnosis not present

## 2014-02-19 DIAGNOSIS — R2689 Other abnormalities of gait and mobility: Secondary | ICD-10-CM | POA: Insufficient documentation

## 2014-02-19 DIAGNOSIS — Z01818 Encounter for other preprocedural examination: Secondary | ICD-10-CM | POA: Diagnosis present

## 2014-02-19 DIAGNOSIS — I4891 Unspecified atrial fibrillation: Secondary | ICD-10-CM | POA: Insufficient documentation

## 2014-02-19 DIAGNOSIS — I10 Essential (primary) hypertension: Secondary | ICD-10-CM | POA: Insufficient documentation

## 2014-02-19 HISTORY — DX: Unspecified osteoarthritis, unspecified site: M19.90

## 2014-02-19 HISTORY — DX: Gastro-esophageal reflux disease without esophagitis: K21.9

## 2014-02-19 LAB — CBC
HEMATOCRIT: 42.7 % (ref 39.0–52.0)
Hemoglobin: 14.1 g/dL (ref 13.0–17.0)
MCH: 33.3 pg (ref 26.0–34.0)
MCHC: 33 g/dL (ref 30.0–36.0)
MCV: 100.9 fL — AB (ref 78.0–100.0)
PLATELETS: 210 10*3/uL (ref 150–400)
RBC: 4.23 MIL/uL (ref 4.22–5.81)
RDW: 13.5 % (ref 11.5–15.5)
WBC: 6.9 10*3/uL (ref 4.0–10.5)

## 2014-02-19 LAB — BASIC METABOLIC PANEL
Anion gap: 7 (ref 5–15)
BUN: 20 mg/dL (ref 6–23)
CHLORIDE: 106 meq/L (ref 96–112)
CO2: 27 mmol/L (ref 19–32)
CREATININE: 1.03 mg/dL (ref 0.50–1.35)
Calcium: 9.3 mg/dL (ref 8.4–10.5)
GFR calc Af Amer: 74 mL/min — ABNORMAL LOW (ref >90)
GFR calc non Af Amer: 64 mL/min — ABNORMAL LOW (ref >90)
Glucose, Bld: 80 mg/dL (ref 70–99)
Potassium: 4.2 mmol/L (ref 3.5–5.1)
Sodium: 140 mmol/L (ref 135–145)

## 2014-02-19 NOTE — Progress Notes (Signed)
Pt. Sees Dr. Percival Spanish.  LOV was 2-3 weeks ago.  Has been doing well. No problems.   Has been instructed to stop taking Xarelto (per Dr. Rosezella Florida order) 2 days prior surgery.

## 2014-02-20 NOTE — Progress Notes (Signed)
Anesthesia Chart Review:  Patient is a 80 year old male scheduled for laparoscopic right IHR on 02/26/14 by Dr. Rosendo Gros.  History includes afib, complete heart block and bradycardia, NSTEMI 02/2011, mild CAD by 2013 cath, HTN, BPH, GERD, balance problem, BPH, OSA, arthritis. former smoker. PCP is listed as Dr. [name].   Cardiologist is Dr. Percival Spanish who last saw patient on 01/01/14 for routine follow-up and preoperative evaluation.  Patient's HR was 35 bpm at that time--as it was at PAT.  He also remained in afib.  Dr. Percival Spanish did not feel there was any need for cardioversion or PPM.  His note states, "I see no absolute contraindication to this. He has tolerated conscious sedation recently. He would certainly need close telemetry followup of this. I would suggest that this be done at [facility] so that cardiology services are available if necessary. We did discuss holding his Xarelto for 2 days prior to the procedure."  01/01/14 EKG: afib with slow ventricular response at 35 bpm with a competing junctional pacemaker, incomplete right BBB.  03/16/11 cardiac cath: -Coronary dominance: right -Left mainstem: Normal. -Left anterior descending (LAD): This is a large vessel that gives rise to 3 diagonal branches. There are mild irregularities in the mid vessel of approximately 10-20%. -Left circumflex (LCx): Distrust to one large obtuse marginal vessel and then terminates in a smaller marginal branch. There is minor irregularity in the proximal vessel less than 10%. -Right coronary artery (RCA): This is a dominant vessel. There is diffuse irregularity in the mid vessel up to 20%. -Left ventriculography: Left ventricular systolic function is overall normal, LVEF is estimated at 55%, there is hypokinesis of the basal anterior wall. There is no significant mitral insufficiency.  03/16/11 echo: - Left ventricle: The cavity size was normal. Wall thickness was normal. Systolic function was vigorous. The  estimatedejection fraction was in the range of 65% to 70%. Wallmotion was normal; there were no regional wall motionabnormalities. Left ventricular diastolic functionparameters were normal. - Mitral valve: Calcified annulus. Mildly thickened leaflets. Mild regurgitation. - Left atrium: The atrium was mildly dilated. - Right ventricle: The cavity size was mildly dilated.  10/26/13 1V CXR: The heart size and mediastinal contours are stable. The lungs are clear. There is no pleural effusion or pneumothorax. Calcified pleural plaque formation is again noted bilaterally.  Preoperative labs noted.    Patient felt stable for surgery following recent cardiology evaluation.  He has significant bradycardia, but has been asymptomatic without need for a PPM.  Consider placing pacing pads perioperatively.  Further evaluation by his assigned anesthesiologist on the day of surgery.  George Hugh [facility] Short Stay Center/Anesthesiology Phone [phone] 02/20/2014 9:25 AM

## 2014-02-25 MED ORDER — CHLORHEXIDINE GLUCONATE 4 % EX LIQD
1.0000 "application " | Freq: Once | CUTANEOUS | Status: DC
  Filled 2014-02-25: qty 15

## 2014-02-25 MED ORDER — VANCOMYCIN HCL IN DEXTROSE 1-5 GM/200ML-% IV SOLN
1000.0000 mg | INTRAVENOUS | Status: AC
  Administered 2014-02-26: 1000 mg via INTRAVENOUS
  Filled 2014-02-25: qty 200

## 2014-02-26 ENCOUNTER — Encounter (HOSPITAL_COMMUNITY): Payer: Self-pay | Admitting: *Deleted

## 2014-02-26 ENCOUNTER — Ambulatory Visit (HOSPITAL_COMMUNITY): Payer: PPO | Admitting: Certified Registered"

## 2014-02-26 ENCOUNTER — Ambulatory Visit (HOSPITAL_COMMUNITY)
Admission: RE | Admit: 2014-02-26 | Discharge: 2014-02-26 | Disposition: A | Discharge status: Home / Self Care | Payer: PPO | Source: Ambulatory Visit | Attending: General Surgery | Admitting: General Surgery

## 2014-02-26 ENCOUNTER — Encounter (HOSPITAL_COMMUNITY)
Admission: RE | Disposition: A | Discharge status: Home / Self Care | Payer: Self-pay | Source: Ambulatory Visit | Attending: General Surgery

## 2014-02-26 ENCOUNTER — Ambulatory Visit (HOSPITAL_COMMUNITY): Payer: PPO | Admitting: Vascular Surgery

## 2014-02-26 DIAGNOSIS — M1990 Unspecified osteoarthritis, unspecified site: Secondary | ICD-10-CM | POA: Diagnosis not present

## 2014-02-26 DIAGNOSIS — I10 Essential (primary) hypertension: Secondary | ICD-10-CM | POA: Diagnosis not present

## 2014-02-26 DIAGNOSIS — I4891 Unspecified atrial fibrillation: Secondary | ICD-10-CM | POA: Diagnosis not present

## 2014-02-26 DIAGNOSIS — Z87891 Personal history of nicotine dependence: Secondary | ICD-10-CM | POA: Diagnosis not present

## 2014-02-26 DIAGNOSIS — Z79899 Other long term (current) drug therapy: Secondary | ICD-10-CM | POA: Diagnosis not present

## 2014-02-26 DIAGNOSIS — I252 Old myocardial infarction: Secondary | ICD-10-CM | POA: Diagnosis not present

## 2014-02-26 DIAGNOSIS — G4733 Obstructive sleep apnea (adult) (pediatric): Secondary | ICD-10-CM | POA: Diagnosis not present

## 2014-02-26 DIAGNOSIS — K4090 Unilateral inguinal hernia, without obstruction or gangrene, not specified as recurrent: Secondary | ICD-10-CM | POA: Insufficient documentation

## 2014-02-26 DIAGNOSIS — K219 Gastro-esophageal reflux disease without esophagitis: Secondary | ICD-10-CM | POA: Insufficient documentation

## 2014-02-26 DIAGNOSIS — N400 Enlarged prostate without lower urinary tract symptoms: Secondary | ICD-10-CM | POA: Insufficient documentation

## 2014-02-26 DIAGNOSIS — I2510 Atherosclerotic heart disease of native coronary artery without angina pectoris: Secondary | ICD-10-CM | POA: Diagnosis not present

## 2014-02-26 HISTORY — PX: INGUINAL HERNIA REPAIR: SHX194

## 2014-02-26 HISTORY — PX: INSERTION OF MESH: SHX5868

## 2014-02-26 SURGERY — REPAIR, HERNIA, INGUINAL, LAPAROSCOPIC
Anesthesia: General | Site: Groin | Laterality: Right

## 2014-02-26 MED ORDER — OXYCODONE HCL 5 MG PO TABS
ORAL_TABLET | ORAL | Status: AC
  Administered 2014-02-26: 5 mg via ORAL
  Filled 2014-02-26: qty 1

## 2014-02-26 MED ORDER — ONDANSETRON HCL 4 MG/2ML IJ SOLN
INTRAMUSCULAR | Status: AC
  Filled 2014-02-26: qty 2

## 2014-02-26 MED ORDER — FENTANYL CITRATE 0.05 MG/ML IJ SOLN
INTRAMUSCULAR | Status: AC
  Filled 2014-02-26: qty 5

## 2014-02-26 MED ORDER — LIDOCAINE HCL (CARDIAC) 20 MG/ML IV SOLN
INTRAVENOUS | Status: DC | PRN
  Administered 2014-02-26: 80 mg via INTRAVENOUS

## 2014-02-26 MED ORDER — SODIUM CHLORIDE 0.9 % IJ SOLN: 3.0000 mL | Freq: Two times a day (BID) | INTRAMUSCULAR | Status: DC

## 2014-02-26 MED ORDER — NEOSTIGMINE METHYLSULFATE 10 MG/10ML IV SOLN
INTRAVENOUS | Status: AC
  Filled 2014-02-26: qty 1

## 2014-02-26 MED ORDER — EPINEPHRINE HCL 0.1 MG/ML IJ SOSY
PREFILLED_SYRINGE | INTRAMUSCULAR | Status: AC
  Filled 2014-02-26: qty 10

## 2014-02-26 MED ORDER — ACETAMINOPHEN 325 MG PO TABS: 650.0000 mg | ORAL_TABLET | ORAL | Status: DC | PRN

## 2014-02-26 MED ORDER — OXYCODONE-ACETAMINOPHEN 5-325 MG PO TABS: 1.0000 | ORAL_TABLET | ORAL | Status: DC | PRN

## 2014-02-26 MED ORDER — MIDAZOLAM HCL 2 MG/2ML IJ SOLN
INTRAMUSCULAR | Status: AC
  Filled 2014-02-26: qty 2

## 2014-02-26 MED ORDER — BUPIVACAINE HCL 0.25 % IJ SOLN
INTRAMUSCULAR | Status: DC | PRN
  Administered 2014-02-26: 3 mL

## 2014-02-26 MED ORDER — ROCURONIUM BROMIDE 50 MG/5ML IV SOLN
INTRAVENOUS | Status: AC
  Filled 2014-02-26: qty 1

## 2014-02-26 MED ORDER — SODIUM CHLORIDE 0.9 % IJ SOLN: 3.0000 mL | INTRAMUSCULAR | Status: DC | PRN

## 2014-02-26 MED ORDER — BUPIVACAINE HCL (PF) 0.25 % IJ SOLN
INTRAMUSCULAR | Status: AC
  Filled 2014-02-26: qty 30

## 2014-02-26 MED ORDER — MORPHINE SULFATE 2 MG/ML IJ SOLN: 1.0000 mg | INTRAMUSCULAR | Status: DC | PRN

## 2014-02-26 MED ORDER — SUCCINYLCHOLINE CHLORIDE 20 MG/ML IJ SOLN
INTRAMUSCULAR | Status: DC | PRN
  Administered 2014-02-26: 80 mg via INTRAVENOUS

## 2014-02-26 MED ORDER — MIDAZOLAM HCL 5 MG/5ML IJ SOLN
INTRAMUSCULAR | Status: DC | PRN
  Administered 2014-02-26: 1 mg via INTRAVENOUS

## 2014-02-26 MED ORDER — ONDANSETRON HCL 4 MG/2ML IJ SOLN
INTRAMUSCULAR | Status: DC | PRN
  Administered 2014-02-26: 4 mg via INTRAVENOUS

## 2014-02-26 MED ORDER — ACETAMINOPHEN 650 MG RE SUPP: 650.0000 mg | RECTAL | Status: DC | PRN

## 2014-02-26 MED ORDER — PROMETHAZINE HCL 25 MG/ML IJ SOLN: 6.2500 mg | INTRAMUSCULAR | Status: DC | PRN

## 2014-02-26 MED ORDER — LIDOCAINE HCL (CARDIAC) 20 MG/ML IV SOLN
INTRAVENOUS | Status: AC
  Filled 2014-02-26: qty 5

## 2014-02-26 MED ORDER — DEXAMETHASONE SODIUM PHOSPHATE 4 MG/ML IJ SOLN
INTRAMUSCULAR | Status: AC
  Filled 2014-02-26: qty 1

## 2014-02-26 MED ORDER — OXYCODONE HCL 5 MG PO TABS
5.0000 mg | ORAL_TABLET | ORAL | Status: DC | PRN
  Administered 2014-02-26: 5 mg via ORAL

## 2014-02-26 MED ORDER — LACTATED RINGERS IV SOLN
INTRAVENOUS | Status: DC | PRN
  Administered 2014-02-26: 07:00:00 via INTRAVENOUS

## 2014-02-26 MED ORDER — 0.9 % SODIUM CHLORIDE (POUR BTL) OPTIME
TOPICAL | Status: DC | PRN
  Administered 2014-02-26: 1000 mL

## 2014-02-26 MED ORDER — EPINEPHRINE HCL 0.1 MG/ML IJ SOSY
PREFILLED_SYRINGE | INTRAMUSCULAR | Status: DC | PRN
  Administered 2014-02-26: 30 ug via INTRAVENOUS

## 2014-02-26 MED ORDER — PROPOFOL 10 MG/ML IV BOLUS
INTRAVENOUS | Status: AC
  Filled 2014-02-26: qty 20

## 2014-02-26 MED ORDER — PROPOFOL 10 MG/ML IV BOLUS
INTRAVENOUS | Status: DC | PRN
  Administered 2014-02-26: 70 mg via INTRAVENOUS

## 2014-02-26 MED ORDER — SODIUM CHLORIDE 0.9 % IV SOLN: 250.0000 mL | INTRAVENOUS | Status: DC | PRN

## 2014-02-26 MED ORDER — SUCCINYLCHOLINE CHLORIDE 20 MG/ML IJ SOLN
INTRAMUSCULAR | Status: AC
  Filled 2014-02-26: qty 1

## 2014-02-26 MED ORDER — FENTANYL CITRATE 0.05 MG/ML IJ SOLN: 25.0000 ug | INTRAMUSCULAR | Status: DC | PRN

## 2014-02-26 MED ORDER — GLYCOPYRROLATE 0.2 MG/ML IJ SOLN
INTRAMUSCULAR | Status: AC
  Filled 2014-02-26: qty 2

## 2014-02-26 MED ORDER — FENTANYL CITRATE 0.05 MG/ML IJ SOLN
INTRAMUSCULAR | Status: DC | PRN
  Administered 2014-02-26 (×2): 25 ug via INTRAVENOUS
  Administered 2014-02-26: 50 ug via INTRAVENOUS

## 2014-02-26 MED ORDER — DEXAMETHASONE SODIUM PHOSPHATE 4 MG/ML IJ SOLN
INTRAMUSCULAR | Status: DC | PRN
  Administered 2014-02-26: 4 mg via INTRAVENOUS

## 2014-02-26 SURGICAL SUPPLY — 51 items
APL SKNCLS STERI-STRIP NONHPOA (GAUZE/BANDAGES/DRESSINGS) ×1
APPLIER CLIP 5 13 M/L LIGAMAX5 (MISCELLANEOUS)
APR CLP MED LRG 5 ANG JAW (MISCELLANEOUS)
BENZOIN TINCTURE PRP APPL 2/3 (GAUZE/BANDAGES/DRESSINGS) ×2 IMPLANT
CANISTER SUCTION 2500CC (MISCELLANEOUS) IMPLANT
CHLORAPREP W/TINT 26ML (MISCELLANEOUS) ×2 IMPLANT
CLIP APPLIE 5 13 M/L LIGAMAX5 (MISCELLANEOUS) IMPLANT
COVER SURGICAL LIGHT HANDLE (MISCELLANEOUS) ×2 IMPLANT
DECANTER SPIKE VIAL GLASS SM (MISCELLANEOUS) ×1 IMPLANT
DISSECTOR BLUNT TIP ENDO 5MM (MISCELLANEOUS) IMPLANT
DRAPE LAPAROSCOPIC ABDOMINAL (DRAPES) ×2 IMPLANT
ELECT REM PT RETURN 9FT ADLT (ELECTROSURGICAL) ×2
ELECTRODE REM PT RTRN 9FT ADLT (ELECTROSURGICAL) ×1 IMPLANT
GAUZE SPONGE 2X2 8PLY STRL LF (GAUZE/BANDAGES/DRESSINGS) ×1 IMPLANT
GLOVE BIO SURGEON STRL SZ7 (GLOVE) ×1 IMPLANT
GLOVE BIO SURGEON STRL SZ7.5 (GLOVE) ×2 IMPLANT
GLOVE BIOGEL PI IND STRL 6 (GLOVE) IMPLANT
GLOVE BIOGEL PI IND STRL 7.0 (GLOVE) IMPLANT
GLOVE BIOGEL PI INDICATOR 6 (GLOVE) ×2
GLOVE BIOGEL PI INDICATOR 7.0 (GLOVE) ×2
GLOVE SURG SS PI 7.0 STRL IVOR (GLOVE) ×1 IMPLANT
GOWN STRL REUS W/ TWL LRG LVL3 (GOWN DISPOSABLE) ×2 IMPLANT
GOWN STRL REUS W/ TWL XL LVL3 (GOWN DISPOSABLE) ×1 IMPLANT
GOWN STRL REUS W/TWL LRG LVL3 (GOWN DISPOSABLE) ×4
GOWN STRL REUS W/TWL XL LVL3 (GOWN DISPOSABLE) ×2
KIT BASIN OR (CUSTOM PROCEDURE TRAY) ×2 IMPLANT
KIT ROOM TURNOVER OR (KITS) ×2 IMPLANT
MESH 3DMAX 4X6 RT LRG (Mesh General) ×1 IMPLANT
NDL INSUFFLATION 14GA 120MM (NEEDLE) ×1 IMPLANT
NEEDLE INSUFFLATION 14GA 120MM (NEEDLE) ×2 IMPLANT
NS IRRIG 1000ML POUR BTL (IV SOLUTION) ×2 IMPLANT
PAD ARMBOARD 7.5X6 YLW CONV (MISCELLANEOUS) ×4 IMPLANT
RELOAD STAPLE 4.0 BLU F/HERNIA (INSTRUMENTS) ×1 IMPLANT
RELOAD STAPLE 4.8 BLK F/HERNIA (STAPLE) IMPLANT
RELOAD STAPLE HERNIA 4.0 BLUE (INSTRUMENTS) ×2 IMPLANT
RELOAD STAPLE HERNIA 4.8 BLK (STAPLE) IMPLANT
SCISSORS LAP 5X35 DISP (ENDOMECHANICALS) ×2 IMPLANT
SET IRRIG TUBING LAPAROSCOPIC (IRRIGATION / IRRIGATOR) IMPLANT
SET TROCAR LAP APPLE-HUNT 5MM (ENDOMECHANICALS) ×2 IMPLANT
SPONGE GAUZE 2X2 STER 10/PKG (GAUZE/BANDAGES/DRESSINGS) ×1
STAPLER HERNIA 12 8.5 360D (INSTRUMENTS) ×2 IMPLANT
STRIP CLOSURE SKIN 1/2X4 (GAUZE/BANDAGES/DRESSINGS) ×2 IMPLANT
SUT MNCRL AB 4-0 PS2 18 (SUTURE) ×2 IMPLANT
SUT VIC AB 1 CT1 27 (SUTURE) ×2
SUT VIC AB 1 CT1 27XBRD ANBCTR (SUTURE) IMPLANT
TOWEL OR 17X24 6PK STRL BLUE (TOWEL DISPOSABLE) ×1 IMPLANT
TOWEL OR 17X26 10 PK STRL BLUE (TOWEL DISPOSABLE) ×2 IMPLANT
TRAY FOLEY CATH 16FR SILVER (SET/KITS/TRAYS/PACK) ×2 IMPLANT
TRAY LAPAROSCOPIC (CUSTOM PROCEDURE TRAY) ×2 IMPLANT
TROCAR XCEL 12X100 BLDLESS (ENDOMECHANICALS) ×2 IMPLANT
TUBING INSUFFLATION (TUBING) ×2 IMPLANT

## 2014-02-26 NOTE — H&P (Signed)
Chad Russell is an 81 y.o. male.   Chief Complaint: RIH HPI: 75 y/ M with RIH and pain.  Pain has been less recently.    Past Medical History  Diagnosis Date  . Hypertension   . Complete heart block   . NSTEMI (non-ST elevated myocardial infarction)   . Talar fracture     casted no surg  . CAD (coronary artery disease)     LHC 03/16/11: Mid LAD 10-20%, proximal circumflex 10%, mid RCA 20%, EF 55%.  . Atrial fibrillation   . Hx of echocardiogram     Echo 02/2011: EF 65-70%, normal wall motion, MAC, mild MR, mild LAE, mild RVE  . OSA (obstructive sleep apnea)   . BPH (benign prostatic hyperplasia) 2014    Urology Dr [name] with [facility] [phone]   . Balance problem 10/31/2013  . GERD (gastroesophageal reflux disease)     takes occasional  zantac  . Arthritis     Past Surgical History  Procedure Laterality Date  . Hemmorhoidectomy    . Colonoscopy      Repeated and normal in 2011  . Prostate biopsy      negative - [facility]  . Left heart catheterization with coronary angiogram N/A 03/16/2011    Procedure: LEFT HEART CATHETERIZATION WITH CORONARY ANGIOGRAM;  Surgeon: Peter M Martinique, MD;  Location: [facility] CATH LAB;  Service: Cardiovascular;  Laterality: N/A;    Family History  Problem Relation Age of Onset  . Multiple sclerosis Father   . Hypertension Mother    Social History:  reports that he quit smoking about 58 years ago. His smoking use included Cigarettes. He has a 3 pack-year smoking history. He does not have any smokeless tobacco history on file. He reports that he drinks about 1.2 oz of alcohol per week. He reports that he does not use illicit drugs.  Allergies:  Allergies  Allergen Reactions  . Doxycycline     Upset stomach  . Keflex [Cephalexin]     Abdominal disruption    Medications Prior to Admission  Medication Sig Dispense Refill  . lisinopril-hydrochlorothiazide (PRINZIDE,ZESTORETIC) 10-12.5 MG per tablet TAKE 1 TABLET BY MOUTH DAILY. 30 tablet 3   . Misc Natural Products (DAILY HERBS PROSTATE PO) Take 1 tablet by mouth daily.    . Multiple Vitamins-Minerals (MULTIVITAMIN PO) Take 1 tablet by mouth daily.    . Probiotic Product (PROBIOTIC DAILY PO) Take 1 tablet by mouth daily.    . rivaroxaban (XARELTO) 20 MG TABS tablet Take 1 tablet (20 mg total) by mouth daily. 90 tablet 3  . tamsulosin (FLOMAX) 0.4 MG CAPS capsule TAKE 1 CAPSULE (0.4 MG TOTAL) BY MOUTH DAILY. 90 capsule 0    No results found for this or any previous visit (from the past 48 hour(s)). No results found.  Review of Systems  Constitutional: Negative for weight loss.  HENT: Negative for ear discharge, ear pain, hearing loss and tinnitus.   Eyes: Negative for blurred vision, double vision, photophobia and pain.  Respiratory: Negative for cough, sputum production and shortness of breath.   Cardiovascular: Negative for chest pain.  Gastrointestinal: Negative for nausea, vomiting and abdominal pain.  Genitourinary: Negative for dysuria, urgency, frequency and flank pain.  Musculoskeletal: Negative for myalgias, back pain, joint pain, falls and neck pain.  Neurological: Negative for dizziness, tingling, sensory change, focal weakness, loss of consciousness and headaches.  Endo/Heme/Allergies: Does not bruise/bleed easily.  Psychiatric/Behavioral: Negative for depression, memory loss and substance abuse. The patient  is not nervous/anxious.     Blood pressure 180/77, pulse 35, temperature 97.5 F (36.4 C), temperature source Oral, resp. rate 18, weight 189 lb (85.73 kg), SpO2 100 %. Physical Exam  Constitutional: He is oriented to person, place, and time. He appears well-developed and well-nourished.  HENT:  Head: Normocephalic.  Eyes: Pupils are equal, round, and reactive to light.  Neck: Normal range of motion. Neck supple.  Cardiovascular: Normal rate and normal heart sounds.   Respiratory: Effort normal and breath sounds normal.  GI: Soft. Bowel sounds are  normal. A hernia is present. Hernia confirmed positive in the right inguinal area. Hernia confirmed negative in the left inguinal area.  Neurological: He is alert and oriented to person, place, and time.     Assessment/Plan 81 y/o M with RIH To OR with RIHR with mesh All risks and benefits were discussed with the patient, to generally include infection, bleeding, damage to surrounding structures, acute and chronic nerve pain, and recurrence. Alternatives were offered and described.  All questions were answered and the patient voiced understanding of the procedure and wishes to proceed at this point.   Rosario Jacks., Henley Boettner 02/26/2014, 7:24 AM

## 2014-02-26 NOTE — Op Note (Signed)
02/26/2014  8:20 AM  PATIENT:  Chad Russell  81 y.o. male  PRE-OPERATIVE DIAGNOSIS:  RIGHT INGUINAL HERNIA  POST-OPERATIVE DIAGNOSIS:  RIGHT INGUINAL HERNIA  PROCEDURE:  Procedure(s): LAPAROSCOPIC RIGHT INGUINAL HERNIA REPAIR (Right) INSERTION OF MESH (Right)  SURGEON:  Surgeon(s) and Role:    * Ralene Ok, MD - Primary  ASSISTANTS: none   ANESTHESIA:   local and general  EBL:     BLOOD ADMINISTERED:none  DRAINS: none   LOCAL MEDICATIONS USED:  BUPIVICAINE   SPECIMEN:  No Specimen  DISPOSITION OF SPECIMEN:  N/A  COUNTS:  YES  TOURNIQUET:  * No tourniquets in log *  DICTATION: .Dragon Dictation  Findings:  The patient had a moderate sized right indirect hernia  Indications for procedure:  The patient is a 81 year old male with a right hernia for several months. Patient complained of symptomatology to his right inguinal area. The patient was taken back for elective inguinal hernia repair.  Details of the procedure: The patient was taken back to the operating room. The patient was placed in supine position with bilateral SCDs in place.  The patient was prepped and draped in the usual sterile fashion.  After appropriate anitbiotics were confirmed, a time-out was confirmed and all facts were verified.  0.25% Marcaine was used to infiltrate the umbilical area. He was used to cut down the skin and blunt dissection was used to get the anterior fashion.  The anterior fascia was incised approximately 1 cm and the muscles were retracted laterally. Blunt dissection was then used to create a space in the preperitoneal area. At this time a 10 mm camera was then introduced into the space and advanced the pubic tubercle and a 12 mm trocar was placed over this and insufflation was started.  At this time and space was created from medial to laterally the preperitoneal space. The hernia sac was identified in the indirect space.  There was no direct hernia. Dissection of the hernia  sac was undertaken the vas deferens was identified and protected in all parts of the case.   Once the hernia sac was taken down to approximately the umbilicus a Bard 3D max mesh was introduced into the preperitoneal space.  The mesh was brought over the hernia direct and indirect her nia spaces and anchored into place and secured to Cooper's ligament with 4.55mm staples from a Coviden hernia stapler. It was anchored to the anterior abdominal wall with 4.8 mm staples. The hernia sac was seen lying anterior to the mesh. There was no staples placed laterally. The insufflation was evacuated. The trochars were removed. The anterior fascia was reapproximated using #1 Vicryl on a UR- 6.  Intra-abdominal air was evacuated and the Veress needle removed. The skin was reapproximated using 4-0 Monocryl subcuticular fashion the patient was awakened from general anesthesia and taken to recovery in stable condition.   PLAN OF CARE: Discharge to home after PACU  PATIENT DISPOSITION:  PACU - hemodynamically stable.   Delay start of Pharmacological VTE agent (>24hrs) due to surgical blood loss or risk of bleeding: not applicable

## 2014-02-26 NOTE — Discharge Instructions (Signed)
[facility] _______[facility] ° °INGUINAL HERNIA REPAIR: POST OP INSTRUCTIONS ° °Always review your discharge instruction sheet given to you by the facility where your surgery was performed. °IF YOU HAVE DISABILITY OR FAMILY LEAVE FORMS, YOU MUST BRING THEM TO THE OFFICE FOR PROCESSING.   °DO NOT GIVE THEM TO YOUR DOCTOR. ° °1. A  prescription for pain medication may be given to you upon discharge.  Take your pain medication as prescribed, if needed.  If narcotic pain medicine is not needed, then you may take acetaminophen (Tylenol) or ibuprofen (Advil) as needed. °2. Take your usually prescribed medications unless otherwise directed. °3. If you need a refill on your pain medication, please contact your pharmacy.  They will contact our office to request authorization. Prescriptions will not be filled after 5 pm or on week-ends. °4. You should follow a light diet the first 24 hours after arrival home, such as soup and crackers, etc.  Be sure to include lots of fluids daily.  Resume your normal diet the day after surgery. °5. Most patients will experience some swelling and bruising around the umbilicus or in the groin and scrotum.  Ice packs and reclining will help.  Swelling and bruising can take several days to resolve.  °6. It is common to experience some constipation if taking pain medication after surgery.  Increasing fluid intake and taking a stool softener (such as Colace) will usually help or prevent this problem from occurring.  A mild laxative (Milk of Magnesia or Miralax) should be taken according to package directions if there are no bowel movements after 48 hours. °7. Unless discharge instructions indicate otherwise, you may remove your bandages 24-48 hours after surgery, and you may shower at that time.  You may have steri-strips (small skin tapes) in place directly over the incision.  These strips should be left on the skin for 7-10 days.  If your surgeon used skin glue on the incision, you  may shower in 24 hours.  The glue will flake off over the next 2-3 weeks.  Any sutures or staples will be removed at the office during your follow-up visit. °8. ACTIVITIES:  You may resume regular (light) daily activities beginning the next day--such as daily self-care, walking, climbing stairs--gradually increasing activities as tolerated.  You may have sexual intercourse when it is comfortable.  Refrain from any heavy lifting or straining until approved by your doctor. °a. You may drive when you are no longer taking prescription pain medication, you can comfortably wear a seatbelt, and you can safely maneuver your car and apply brakes. °b. RETURN TO WORK:  __________________________________________________________ °9. You should see your doctor in the office for a follow-up appointment approximately 2-3 weeks after your surgery.  Make sure that you call for this appointment within a day or two after you arrive home to insure a convenient appointment time. °10. OTHER INSTRUCTIONS:  __________________________________________________________________________________________________________________________________________________________________________________________  °WHEN TO CALL YOUR DOCTOR: °1. Fever over 101.0 °2. Inability to urinate °3. Nausea and/or vomiting °4. Extreme swelling or bruising °5. Continued bleeding from incision. °6. Increased pain, redness, or drainage from the incision ° °The clinic staff is available to answer your questions during regular business hours.  Please don’t hesitate to call and ask to speak to one of the nurses for clinical concerns.  If you have a medical emergency, go to the nearest emergency room or call 911.  A surgeon from [facility] is always on call at the hospital ° ° °[address], [place], [place], [place]  [address] ?  [address], [place], [place]   [address] [phone] ? [phone] ? FAX [phone] Web site:  [email or web address] General Anesthesia, Adult, Care After  Refer to this sheet in the next few weeks. These instructions provide you with information on caring for yourself after your procedure. Your health care provider may also give you more specific instructions. Your treatment has been planned according to current medical practices, but problems sometimes occur. Call your health care provider if you have any problems or questions after your procedure.  WHAT TO EXPECT AFTER THE PROCEDURE  After the procedure, it is typical to experience:  Sleepiness.  Nausea and vomiting. HOME CARE INSTRUCTIONS  For the first 24 hours after general anesthesia:  Have a responsible person with you.  Do not drive a car. If you are alone, do not take public transportation.  Do not drink alcohol.  Do not take medicine that has not been prescribed by your health care provider.  Do not sign important papers or make important decisions.  You may resume a normal diet and activities as directed by your health care provider.  Change bandages (dressings) as directed.  If you have questions or problems that seem related to general anesthesia, call the hospital and ask for the anesthetist or anesthesiologist on call. SEEK MEDICAL CARE IF:  You have nausea and vomiting that continue the day after anesthesia.  You develop a rash. SEEK IMMEDIATE MEDICAL CARE IF:  You have difficulty breathing.  You have chest pain.  You have any allergic problems. Document Released: 05/17/2000 Document Revised: 10/11/2012 Document Reviewed: 08/24/2012   [facility] Patient Information 2014 [place], [place].

## 2014-02-26 NOTE — Anesthesia Procedure Notes (Signed)
Procedure Name: Intubation Date/Time: 02/26/2014 7:35 AM Performed by: Melina Copa, Tucker Minter R Pre-anesthesia Checklist: Patient identified, Emergency Drugs available, Suction available, Patient being monitored and Timeout performed Patient Re-evaluated:Patient Re-evaluated prior to inductionOxygen Delivery Method: Circle system utilized Preoxygenation: Pre-oxygenation with 100% oxygen Intubation Type: IV induction Ventilation: Mask ventilation without difficulty Laryngoscope Size: Mac and 4 Grade View: Grade I Tube type: Oral Tube size: 8.0 mm Number of attempts: 1 Airway Equipment and Method: Stylet Placement Confirmation: ETT inserted through vocal cords under direct vision,  positive ETCO2 and breath sounds checked- equal and bilateral Secured at: 23 cm Tube secured with: Tape Dental Injury: Teeth and Oropharynx as per pre-operative assessment

## 2014-02-26 NOTE — Anesthesia Postprocedure Evaluation (Signed)
  Anesthesia Post-op Note  Patient: Chad Russell  Procedure(s) Performed: Procedure(s) (LRB): LAPAROSCOPIC RIGHT INGUINAL HERNIA REPAIR (Right) INSERTION OF MESH (Right)  Patient Location: PACU  Anesthesia Type: General  Level of Consciousness: awake and alert   Airway and Oxygen Therapy: Patient Spontanous Breathing  Post-op Pain: mild  Post-op Assessment: Post-op Vital signs reviewed, Patient's Cardiovascular Status Stable, Respiratory Function Stable, Patent Airway and No signs of Nausea or vomiting  Last Vitals:  Filed Vitals:   02/26/14 0900  BP: 158/73  Pulse: 41  Temp:   Resp: 16    Post-op Vital Signs: stable   Complications: No apparent anesthesia complications

## 2014-02-26 NOTE — Progress Notes (Signed)
Called Dr Rosendo Gros about when client may resume xarelto and per Dr Rosendo Gros client may resume xarelto in 2 days; Per Dr Rosendo Gros need to call Dr Kalman Shan with anesthesia about discharge and called Dr Kalman Shan and per Dr Kalman Shan ok to d/c home now

## 2014-02-26 NOTE — Progress Notes (Signed)
Report given to jamie rn as caregiver 

## 2014-02-26 NOTE — Anesthesia Preprocedure Evaluation (Addendum)
Anesthesia Evaluation  Patient identified by MRN, date of birth, ID band Patient awake    Reviewed: Allergy & Precautions, NPO status , Patient's Chart, lab work & pertinent test results  Airway Mallampati: II  TM Distance: >3 FB Neck ROM: Full    Dental no notable dental hx.    Pulmonary sleep apnea , former smoker,  breath sounds clear to auscultation  Pulmonary exam normal       Cardiovascular hypertension, + CAD and + Past MI negative cardio ROS  + dysrhythmias Atrial Fibrillation Rhythm:Irregular Rate:Bradycardia     Neuro/Psych negative neurological ROS  negative psych ROS   GI/Hepatic negative GI ROS, Neg liver ROS,   Endo/Other  negative endocrine ROS  Renal/GU negative Renal ROS  negative genitourinary   Musculoskeletal negative musculoskeletal ROS (+)   Abdominal   Peds negative pediatric ROS (+)  Hematology negative hematology ROS (+)   Anesthesia Other Findings   Reproductive/Obstetrics negative OB ROS                            Anesthesia Physical Anesthesia Plan  ASA: IV  Anesthesia Plan: General   Post-op Pain Management:    Induction: Intravenous  Airway Management Planned: Oral ETT  Additional Equipment:   Intra-op Plan:   Post-operative Plan: Extubation in OR  Informed Consent: I have reviewed the patients History and Physical, chart, labs and discussed the procedure including the risks, benefits and alternatives for the proposed anesthesia with the patient or authorized representative who has indicated his/her understanding and acceptance.   Dental advisory given  Plan Discussed with: CRNA and Surgeon  Anesthesia Plan Comments: (Bradycardia is the most common dysrrhythmia before asystole. The patient is in a high risk stratification bracket because of his heart block which has not been treated with a pacemaker. Discussed with patient )         Anesthesia Quick Evaluation

## 2014-02-26 NOTE — Transfer of Care (Signed)
Immediate Anesthesia Transfer of Care Note  Patient: Chad Russell  Procedure(s) Performed: Procedure(s): LAPAROSCOPIC RIGHT INGUINAL HERNIA REPAIR (Right) INSERTION OF MESH (Right)  Patient Location: PACU  Anesthesia Type:General  Level of Consciousness: awake, alert  and oriented  Airway & Oxygen Therapy: Patient Spontanous Breathing and Patient connected to nasal cannula oxygen  Post-op Assessment: Report given to PACU RN and Post -op Vital signs reviewed and stable  Post vital signs: Reviewed and stable  Complications: No apparent anesthesia complications

## 2014-02-28 ENCOUNTER — Encounter (HOSPITAL_COMMUNITY): Payer: Self-pay | Admitting: General Surgery

## 2014-03-01 ENCOUNTER — Ambulatory Visit (INDEPENDENT_AMBULATORY_CARE_PROVIDER_SITE_OTHER): Payer: PPO | Admitting: Family Medicine

## 2014-03-01 VITALS — BP 150/100 | HR 40 | Temp 97.8°F | Resp 18 | Ht 72.0 in | Wt 187.0 lb

## 2014-03-01 DIAGNOSIS — Z9889 Other specified postprocedural states: Secondary | ICD-10-CM

## 2014-03-01 DIAGNOSIS — Z8719 Personal history of other diseases of the digestive system: Secondary | ICD-10-CM

## 2014-03-01 DIAGNOSIS — J9811 Atelectasis: Secondary | ICD-10-CM

## 2014-03-01 NOTE — Patient Instructions (Signed)
Practice good deep breathing and coughing  Return if any increase congestion or fever should occur  Change the dressings again in 3 days or so. Probably can leave open or just under a Band-Aid at that point.  Return if needed

## 2014-03-01 NOTE — Progress Notes (Signed)
Subjective: 81 year old man who had a herniorrhaphy repair early this week. He's doing well from that. He wanted to be rechecked. He has had no come up occasions. 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  Objective: Throat clear. Neck supple without nodes. Chest has one little wheeze in the left lower lung which cleared with deep inspirations. Heart irregular without murmur. Chronic bradycardia. Dressings were removed. He does have Steri-Strips which are in place. Wounds all look good. Redressed with dry Telfa dressing.   Assessment: Status post herniorrhaphy Mild atelectasis Atrial fibrillation with chronic bradycardia  Plan: He looks great. he is to return when necessary.

## 2014-03-23 ENCOUNTER — Other Ambulatory Visit: Payer: Self-pay | Admitting: Cardiology

## 2014-03-25 NOTE — Telephone Encounter (Signed)
Rx refill sent to patient pharmacy   

## 2014-04-03 ENCOUNTER — Ambulatory Visit: Payer: PPO | Admitting: Podiatry

## 2014-05-14 ENCOUNTER — Encounter: Payer: Self-pay | Admitting: *Deleted

## 2014-07-15 ENCOUNTER — Ambulatory Visit (INDEPENDENT_AMBULATORY_CARE_PROVIDER_SITE_OTHER): Payer: PPO | Admitting: *Deleted

## 2014-07-15 DIAGNOSIS — I4891 Unspecified atrial fibrillation: Secondary | ICD-10-CM

## 2014-07-15 LAB — BASIC METABOLIC PANEL
BUN: 19 mg/dL (ref 6–23)
CHLORIDE: 104 meq/L (ref 96–112)
CO2: 29 mEq/L (ref 19–32)
Calcium: 9.4 mg/dL (ref 8.4–10.5)
Creatinine, Ser: 1.06 mg/dL (ref 0.40–1.50)
GFR: 70.41 mL/min (ref >60.00)
Glucose, Bld: 97 mg/dL (ref 70–99)
Potassium: 4 mEq/L (ref 3.5–5.1)
SODIUM: 139 meq/L (ref 135–145)

## 2014-07-15 LAB — CBC
HCT: 44.8 % (ref 39.0–52.0)
Hemoglobin: 14.8 g/dL (ref 13.0–17.0)
MCHC: 33 g/dL (ref 30.0–36.0)
MCV: 99.3 fl (ref 78.0–100.0)
Platelets: 213 10*3/uL (ref 150.0–400.0)
RBC: 4.51 Mil/uL (ref 4.22–5.81)
RDW: 14.3 % (ref 11.5–15.5)
WBC: 6.1 10*3/uL (ref 4.0–10.5)

## 2014-07-15 NOTE — Progress Notes (Signed)
Pt was started on Xarelto for Afib on 12/15/12.    Reviewed patients medication list.  Pt is not  currently on any combined P-gp and strong CYP3A4 inhibitors/inducers (ketoconazole, traconazole, ritonavir, carbamazepine, phenytoin, rifampin, St. John's wort).  Reviewed labs.  SCr 1.06, Weight 85Kg, CrCl- 61.25.  Dose of 20mg s daily   appropriate based on CrCl.   Hgb and HCT 14.8/44.8.   A full discussion of the nature of anticoagulants has been carried out.  A benefit/risk analysis has been presented to the patient, so that they understand the justification for choosing anticoagulation with Xarelto at this time.  The need for compliance is stressed.  Pt is aware to take the medication once daily with the largest meal of the day.  Side effects of potential bleeding are discussed, including unusual colored urine or stools, coughing up blood or coffee ground emesis, nose bleeds or serious fall or head trauma.  Discussed signs and symptoms of stroke. The patient should avoid any OTC items containing aspirin or ibuprofen.  Avoid alcohol consumption.   Call if any signs of abnormal bleeding.  Discussed financial obligations and resolved any difficulty in obtaining medication.  Next lab test test in 6 months.

## 2014-08-16 ENCOUNTER — Ambulatory Visit (INDEPENDENT_AMBULATORY_CARE_PROVIDER_SITE_OTHER): Payer: PPO | Admitting: Family Medicine

## 2014-08-16 VITALS — BP 142/72 | HR 36 | Temp 97.6°F | Resp 18 | Ht 72.5 in | Wt 183.0 lb

## 2014-08-16 DIAGNOSIS — I482 Chronic atrial fibrillation, unspecified: Secondary | ICD-10-CM

## 2014-08-16 DIAGNOSIS — R5383 Other fatigue: Secondary | ICD-10-CM | POA: Diagnosis not present

## 2014-08-16 DIAGNOSIS — S0096XS Insect bite (nonvenomous) of unspecified part of head, sequela: Secondary | ICD-10-CM

## 2014-08-16 DIAGNOSIS — I442 Atrioventricular block, complete: Secondary | ICD-10-CM

## 2014-08-16 DIAGNOSIS — N400 Enlarged prostate without lower urinary tract symptoms: Secondary | ICD-10-CM

## 2014-08-16 DIAGNOSIS — W57XXXS Bitten or stung by nonvenomous insect and other nonvenomous arthropods, sequela: Secondary | ICD-10-CM

## 2014-08-16 DIAGNOSIS — R339 Retention of urine, unspecified: Secondary | ICD-10-CM

## 2014-08-16 DIAGNOSIS — R29818 Other symptoms and signs involving the nervous system: Secondary | ICD-10-CM

## 2014-08-16 DIAGNOSIS — R2689 Other abnormalities of gait and mobility: Secondary | ICD-10-CM

## 2014-08-16 LAB — POCT CBC
GRANULOCYTE PERCENT: 58.8 % (ref 37–80)
HCT, POC: 45.4 % (ref 43.5–53.7)
Hemoglobin: 14.7 g/dL (ref 14.1–18.1)
Lymph, poc: 2.2 (ref 0.6–3.4)
MCH, POC: 32.1 pg — AB (ref 27–31.2)
MCHC: 32.4 g/dL (ref 31.8–35.4)
MCV: 99 fL — AB (ref 80–97)
MID (CBC): 0.5 (ref 0–0.9)
MPV: 7.4 fL (ref 0–99.8)
POC Granulocyte: 3.9 (ref 2–6.9)
POC LYMPH %: 33.8 % (ref 10–50)
POC MID %: 7.4 % (ref 0–12)
Platelet Count, POC: 227 10*3/uL (ref 142–424)
RBC: 4.59 M/uL — AB (ref 4.69–6.13)
RDW, POC: 14.8 %
WBC: 6.6 10*3/uL (ref 4.6–10.2)

## 2014-08-16 LAB — POCT UA - MICROSCOPIC ONLY
Bacteria, U Microscopic: NEGATIVE
CASTS, UR, LPF, POC: NEGATIVE
Crystals, Ur, HPF, POC: NEGATIVE
EPITHELIAL CELLS, URINE PER MICROSCOPY: NEGATIVE
MUCUS UA: NEGATIVE
RBC, urine, microscopic: NEGATIVE
Yeast, UA: NEGATIVE

## 2014-08-16 LAB — POCT URINALYSIS DIPSTICK
Bilirubin, UA: NEGATIVE
Blood, UA: NEGATIVE
Glucose, UA: NEGATIVE
KETONES UA: NEGATIVE
Leukocytes, UA: NEGATIVE
Nitrite, UA: NEGATIVE
PH UA: 6.5
Protein, UA: NEGATIVE
Spec Grav, UA: 1.02
Urobilinogen, UA: 0.2

## 2014-08-16 LAB — COMPLETE METABOLIC PANEL WITH GFR
ALBUMIN: 4.1 g/dL (ref 3.5–5.2)
ALT: 14 U/L (ref 0–53)
AST: 17 U/L (ref 0–37)
Alkaline Phosphatase: 44 U/L (ref 39–117)
BUN: 19 mg/dL (ref 6–23)
CHLORIDE: 105 meq/L (ref 96–112)
CO2: 28 meq/L (ref 19–32)
Calcium: 9.5 mg/dL (ref 8.4–10.5)
Creat: 1.08 mg/dL (ref 0.50–1.35)
GFR, EST AFRICAN AMERICAN: 71 mL/min
GFR, EST NON AFRICAN AMERICAN: 62 mL/min
GLUCOSE: 98 mg/dL (ref 70–99)
Potassium: 4.6 mEq/L (ref 3.5–5.3)
Sodium: 140 mEq/L (ref 135–145)
Total Bilirubin: 1.2 mg/dL (ref 0.2–1.2)
Total Protein: 6.5 g/dL (ref 6.0–8.3)

## 2014-08-16 LAB — TSH: TSH: 2.474 u[IU]/mL (ref 0.350–4.500)

## 2014-08-16 LAB — GLUCOSE, POCT (MANUAL RESULT ENTRY): POC GLUCOSE: 99 mg/dL (ref 70–99)

## 2014-08-16 NOTE — Progress Notes (Addendum)
Subjective:  This chart was scribed for Chad Ray, MD by [name], Medical Scribe. This patient was seen in room 13 and the patient's care was started 10:35 AM.    Patient ID: Chad Russell, male    DOB: Feb 03, 1927, 81 y.o.   MRN: [id number]  HPI Chad Russell is a 81 y.o. male Hx of multiple medical problems including complete heart block, followed by cardiologist, and atrial fibrillation.  Has chronic bradycardia with heart rate of 35 at his last visit with cardiologist in Nov. 2015.   He is here today for possible urinary infection that started a couple of weeks. He had similar symptoms from urinary infection 2 years ago. He sometimes feels like he's not completely emptying.He denies dysuria, and frequency. He saw his urologist 2 months ago and had his prostate exam done.   He also mentions feeling fatigue, like "out of pep" for the past couple of weeks. He denies chest pains, fever, weakness, SOB, black tarry stools. He goes on rigorous walking daily.   He notes taking flomax for bph.   Hx of balance difficulty - seen by neurologist and sleep study was ok. See notes from urology.  Balance feels about the same, if not better with more exercise. He had an age appropriate brain MRI and sleep study that was negative for OSA. Urologist recommended increased hydration. Limited to one glass of wine per day. No other acute neurologic findings and reccommended follow up as needed.   Did some research on Lyme - [place] [employer].  Noted to have some of those symptoms including fatigue, hair loss, imbalance, motion perception (trouble with this in past, but not now).  Did remember having bites on back of head or neck in 2009, but does not remember pulling ticks off of him. Was behind house locally. No travel to [place].     Patient Active Problem List   Diagnosis Date Noted  . Balance problem 10/31/2013  . OSA (obstructive sleep apnea) 10/31/2013  . BPH (benign  prostatic hyperplasia) 08/28/2012  . Atrial fibrillation 06/08/2012  . Bacteremia 04/28/2012  . Hyperplasia of prostate with lower urinary tract symptoms (LUTS) 04/28/2012  . Calf pain 04/28/2012  . Moderate malnutrition 04/28/2012  . Influenza-like illness 04/27/2012  . Infection of urinary tract - recurrent 04/26/2012  . NSTEMI (non-ST elevated myocardial infarction) 03/15/2011  . Complete heart block 03/15/2011  . HTN (hypertension) 03/15/2011   Past Medical History  Diagnosis Date  . Hypertension   . Complete heart block   . NSTEMI (non-ST elevated myocardial infarction)   . Talar fracture     casted no surg  . CAD (coronary artery disease)     LHC 03/16/11: Mid LAD 10-20%, proximal circumflex 10%, mid RCA 20%, EF 55%.  . Atrial fibrillation   . Hx of echocardiogram     Echo 02/2011: EF 65-70%, normal wall motion, MAC, mild MR, mild LAE, mild RVE  . OSA (obstructive sleep apnea)   . BPH (benign prostatic hyperplasia) 2014    Urology Dr [name] with [facility] [phone]   . Balance problem 10/31/2013  . GERD (gastroesophageal reflux disease)     takes occasional  zantac  . Arthritis    Past Surgical History  Procedure Laterality Date  . Hemmorhoidectomy    . Colonoscopy      Repeated and normal in 2011  . Prostate biopsy      negative - [facility]  . Left heart catheterization with coronary  angiogram N/A 03/16/2011    Procedure: LEFT HEART CATHETERIZATION WITH CORONARY ANGIOGRAM;  Surgeon: Peter M Martinique, MD;  Location: [facility] CATH LAB;  Service: Cardiovascular;  Laterality: N/A;  . Inguinal hernia repair Right 02/26/2014    Procedure: LAPAROSCOPIC RIGHT INGUINAL HERNIA REPAIR;  Surgeon: Ralene Ok, MD;  Location: [place];  Service: General;  Laterality: Right;  . Insertion of mesh Right 02/26/2014    Procedure: INSERTION OF MESH;  Surgeon: Ralene Ok, MD;  Location: [place];  Service: General;  Laterality: Right;   Allergies  Allergen Reactions  . Doxycycline     Upset  stomach  . Keflex [Cephalexin]     Abdominal disruption   Prior to Admission medications   Medication Sig Start Date End Date Taking? Authorizing Provider  lisinopril-hydrochlorothiazide (PRINZIDE,ZESTORETIC) 10-12.5 MG per tablet Take 1 tablet by mouth daily. 03/25/14  Yes Minus Breeding, MD  Misc Natural Products (DAILY HERBS PROSTATE PO) Take 1 tablet by mouth daily.   Yes Historical Provider, MD  Multiple Vitamins-Minerals (MULTIVITAMIN PO) Take 1 tablet by mouth daily.   Yes Historical Provider, MD  Probiotic Product (PROBIOTIC DAILY PO) Take 1 tablet by mouth daily.   Yes Historical Provider, MD  rivaroxaban (XARELTO) 20 MG TABS tablet Take 1 tablet (20 mg total) by mouth daily. 12/13/13  Yes Minus Breeding, MD  tamsulosin (FLOMAX) 0.4 MG CAPS capsule TAKE 1 CAPSULE (0.4 MG TOTAL) BY MOUTH DAILY. 11/23/12  Yes [name], MD   History   Social History  . Marital Status: Married    Spouse Name: Mardene Celeste  . Number of Children: 0  . Years of Education: 18   Occupational History  . 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 Smoking status: Former Smoker -- 1.00 packs/day for 3 years    Types: Cigarettes    Quit date: 03/17/1955  . Smokeless tobacco: Not on file  . Alcohol Use: 1.2 oz/week    2 Glasses of wine per week     Comment: daily  . Drug Use: No  . Sexual Activity: No   Other Topics Concern  . 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 Negative for fever.  Respiratory: Negative for shortness of breath.   Cardiovascular: Negative for chest pain.  Gastrointestinal: Negative for diarrhea.  Genitourinary: Negative for dysuria, urgency and frequency.  Neurological: Negative for weakness.       Objective:   Physical  Exam  Constitutional: He is oriented to person, place, and time. He appears well-developed and well-nourished. No distress.  HENT:  Head: Normocephalic and atraumatic.  Eyes: EOM are normal. Pupils are equal, round, and reactive to light.  Neck: Neck supple.  Cardiovascular: Regular rhythm.  Bradycardia present.   Pulmonary/Chest: Effort normal and breath sounds normal. No respiratory distress.  Abdominal: Soft. There is no tenderness.  Musculoskeletal: Normal range of motion.  Neurological: He is alert and oriented to person, place, and time.  Skin: Skin is warm and dry.  Psychiatric: He has a normal mood and affect. His behavior is normal.  Nursing note and vitals reviewed.   Filed Vitals:   08/16/14 0950  BP: 142/72  Pulse: 36  Temp: 97.6 F (  36.4 C)  Resp: 18  Height: 6' 0.5" (1.842 m)  Weight: 183 lb (83.008 kg)  SpO2: 97%     Results for orders placed or performed in visit on 08/16/14  POCT urinalysis dipstick  Result Value Ref Range   Color, UA yellow    Clarity, UA clear    Glucose, UA neg    Bilirubin, UA neg    Ketones, UA neg    Spec Grav, UA 1.020    Blood, UA neg    pH, UA 6.5    Protein, UA neg    Urobilinogen, UA 0.2    Nitrite, UA neg    Leukocytes, UA Negative Negative  POCT UA - Microscopic Only  Result Value Ref Range   WBC, Ur, HPF, POC 0-1    RBC, urine, microscopic neg    Bacteria, U Microscopic neg    Mucus, UA neg    Epithelial cells, urine per micros neg    Crystals, Ur, HPF, POC neg    Casts, Ur, LPF, POC neg    Yeast, UA neg   POCT CBC  Result Value Ref Range   WBC 6.6 4.6 - 10.2 K/uL   Lymph, poc 2.2 0.6 - 3.4   POC LYMPH PERCENT 33.8 10 - 50 %L   MID (cbc) 0.5 0 - 0.9   POC MID % 7.4 0 - 12 %M   POC Granulocyte 3.9 2 - 6.9   Granulocyte percent 58.8 37 - 80 %G   RBC 4.59 (A) 4.69 - 6.13 M/uL   Hemoglobin 14.7 14.1 - 18.1 g/dL   HCT, POC 45.4 43.5 - 53.7 %   MCV 99.0 (A) 80 - 97 fL   MCH, POC 32.1 (A) 27 - 31.2 pg   MCHC  32.4 31.8 - 35.4 g/dL   RDW, POC 14.8 %   Platelet Count, POC 227 142 - 424 K/uL   MPV 7.4 0 - 99.8 fL  POCT glucose (manual entry)  Result Value Ref Range   POC Glucose 99 70 - 99 mg/dl      Assessment & Plan:    CHARLY HOLCOMB is a 81 y.o. male Other fatigue - Plan: POCT urinalysis dipstick, POCT UA - Microscopic Only, PSA, POCT CBC, COMPLETE METABOLIC PANEL WITH GFR, TSH, POCT glucose (manual entry), B. Burgdorfi Antibodies, Urine culture  BPH (benign prostatic hyperplasia) - Plan: PSA, Urine culture  Incomplete emptying of bladder - Plan: POCT urinalysis dipstick, POCT UA - Microscopic Only, PSA  Tick bite of head, sequela - Plan: B. Burgdorfi Antibodies  Balance disorder - Plan: B. Burgdorfi Antibodies  Chronic a-fib  Complete heart block  Somewhat generalized vague sensation of fatigue, but no fevers, no chest pain, no near syncope or reported lightheadedness or dizziness.  His initial concern was if he had a urinary tract infection, but no sign of this on specimen in office. Can check PSA and urine culture.  Reassuring CBC and glucose.  Agreed to check initial tests for Lyme disease as he was concerned about this as a cause of fatigue, but questionable whether the symptoms on the back of his head were truly due to a tick or other bite.  He has been evaluated for his balance disorder by neurology in the past and this is improved by his report.  He does have a history of A. fib and complete heart block which has been overall asymptomatic, but I did  discuss my concern of his fatigue as possibly being symptomatic from  his heart block.  Advised to call cardiologist for this reason for office visit.  Return to clinic, ER or 911 if acute changes in his symptoms or worsening.   No orders of the defined types were placed in this encounter.   Patient Instructions  I do not see a source for fatigue in office today.  I will check kidney, liver and thyroid tests, as well as lyme tests,  but I do not think is the cause at this point. I am concerned that your heart block may be contributing to the fatigue, so call cardiologist to be seen in next 1 week - or if any worsening of symptoms - go to emergency room or call 911.  Return to the clinic or go to the nearest emergency room if any of your symptoms worsen or new symptoms occur.     I personally performed the services described in this documentation, which was scribed in my presence. The recorded information has been reviewed and considered, and addended by me as needed.

## 2014-08-16 NOTE — Patient Instructions (Signed)
I do not see a source for fatigue in office today.  I will check kidney, liver and thyroid tests, as well as lyme tests, but I do not think is the cause at this point. I am concerned that your heart block may be contributing to the fatigue, so call cardiologist to be seen in next 1 week - or if any worsening of symptoms - go to emergency room or call 911.  Return to the clinic or go to the nearest emergency room if any of your symptoms worsen or new symptoms occur.

## 2014-08-17 LAB — URINE CULTURE
COLONY COUNT: NO GROWTH
Organism ID, Bacteria: NO GROWTH

## 2014-08-17 LAB — PSA: PSA: 7.32 ng/mL — ABNORMAL HIGH (ref <=4.00)

## 2014-08-19 LAB — B. BURGDORFI ANTIBODIES: B burgdorferi Ab IgG+IgM: 0.06 {ISR}

## 2014-11-29 ENCOUNTER — Other Ambulatory Visit: Payer: Self-pay

## 2014-11-29 MED ORDER — RIVAROXABAN 20 MG PO TABS: 20.0000 mg | ORAL_TABLET | Freq: Every day | ORAL | Status: DC

## 2014-12-06 ENCOUNTER — Ambulatory Visit (INDEPENDENT_AMBULATORY_CARE_PROVIDER_SITE_OTHER): Payer: PPO | Admitting: *Deleted

## 2014-12-06 DIAGNOSIS — Z23 Encounter for immunization: Secondary | ICD-10-CM

## 2014-12-20 ENCOUNTER — Other Ambulatory Visit: Payer: Self-pay

## 2014-12-20 ENCOUNTER — Other Ambulatory Visit: Payer: Self-pay | Admitting: Cardiology

## 2014-12-20 NOTE — Telephone Encounter (Signed)
°  STAT if patient is at the pharmacy , call can be transferred to refill team.   1. Which medications need to be refilled? Xarelto 10mg    2. Which pharmacy/location is medication to be sent to?[facility]    3. Do they need a 30 day or 90 day supply? [place]

## 2014-12-20 NOTE — Telephone Encounter (Signed)
Called patient to verify dosage of Rx. ( The chart says 20 mg, telephone message says 10 mg.)  Pt's. wife states that he has already received his refill and it was correct, 20 mg.

## 2014-12-21 ENCOUNTER — Other Ambulatory Visit: Payer: Self-pay | Admitting: Cardiology

## 2014-12-31 ENCOUNTER — Encounter: Payer: Self-pay | Admitting: Cardiology

## 2015-01-06 ENCOUNTER — Ambulatory Visit: Payer: Self-pay | Admitting: Cardiology

## 2015-01-06 MED ORDER — RIVAROXABAN 20 MG PO TABS: 20.0000 mg | ORAL_TABLET | Freq: Every day | ORAL | Status: DC

## 2015-01-06 NOTE — Telephone Encounter (Signed)
Pt's medication sent to pt's pharmacy requested. Confirmation received.

## 2015-01-10 ENCOUNTER — Other Ambulatory Visit: Payer: Self-pay | Admitting: Cardiology

## 2015-01-20 ENCOUNTER — Ambulatory Visit (INDEPENDENT_AMBULATORY_CARE_PROVIDER_SITE_OTHER): Payer: PPO | Admitting: Pharmacist

## 2015-01-20 VITALS — Ht 72.0 in | Wt 183.0 lb

## 2015-01-20 DIAGNOSIS — I4891 Unspecified atrial fibrillation: Secondary | ICD-10-CM

## 2015-01-20 LAB — BASIC METABOLIC PANEL
BUN: 21 mg/dL (ref 7–25)
CO2: 28 mmol/L (ref 20–31)
CREATININE: 0.85 mg/dL (ref 0.70–1.11)
Calcium: 9.4 mg/dL (ref 8.6–10.3)
Chloride: 105 mmol/L (ref 98–110)
Glucose, Bld: 89 mg/dL (ref 65–99)
Potassium: 4.3 mmol/L (ref 3.5–5.3)
Sodium: 139 mmol/L (ref 135–146)

## 2015-01-20 LAB — CBC
HCT: 40.7 % (ref 39.0–52.0)
Hemoglobin: 14.1 g/dL (ref 13.0–17.0)
MCH: 33.4 pg (ref 26.0–34.0)
MCHC: 34.6 g/dL (ref 30.0–36.0)
MCV: 96.4 fL (ref 78.0–100.0)
MPV: 9.6 fL (ref 8.6–12.4)
Platelets: 225 10*3/uL (ref 150–400)
RBC: 4.22 MIL/uL (ref 4.22–5.81)
RDW: 14 % (ref 11.5–15.5)
WBC: 5.4 10*3/uL (ref 4.0–10.5)

## 2015-01-20 NOTE — Progress Notes (Signed)
Pt was started on Xarelto 20mg  daily by Dr. Percival Spanish for atrial fibrillation on 12/15/12.  Reviewed patients medication list.  Pt is not currently on any combined P-gp and strong CYP3A4 inhibitors/inducers (ketoconazole, traconazole, ritonavir, carbamazepine, phenytoin, rifampin, St. John's wort).  Reviewed labs.  SCr 0.85, Weight 83kg, CrCl 46mL/min.  Dose appropriate based on CrCl.   Hgb and HCT stable and WNL at 14.1 and 40.7. Pt has been informed of lab results.  A full discussion of the nature of anticoagulants has been carried out.  A benefit/risk analysis has been presented to the patient, so that they understand the justification for choosing anticoagulation with Xarelto at this time.  The need for compliance is stressed.  Pt is aware to take the medication once daily with the largest meal of the day.  Side effects of potential bleeding are discussed, including unusual colored urine or stools, coughing up blood or coffee ground emesis, nose bleeds or serious fall or head trauma.  Discussed signs and symptoms of stroke. The patient should avoid any OTC items containing aspirin or ibuprofen.  Avoid alcohol consumption.   Call if any signs of abnormal bleeding.  Discussed financial obligations and resolved any difficulty in obtaining medication.

## 2015-01-23 ENCOUNTER — Ambulatory Visit (INDEPENDENT_AMBULATORY_CARE_PROVIDER_SITE_OTHER): Payer: PPO | Admitting: Cardiology

## 2015-01-23 ENCOUNTER — Encounter: Payer: Self-pay | Admitting: Cardiology

## 2015-01-23 VITALS — BP 172/84 | HR 36 | Ht 72.0 in | Wt 181.6 lb

## 2015-01-23 DIAGNOSIS — I442 Atrioventricular block, complete: Secondary | ICD-10-CM

## 2015-01-23 DIAGNOSIS — I4819 Other persistent atrial fibrillation: Secondary | ICD-10-CM

## 2015-01-23 DIAGNOSIS — I214 Non-ST elevation (NSTEMI) myocardial infarction: Secondary | ICD-10-CM

## 2015-01-23 DIAGNOSIS — I481 Persistent atrial fibrillation: Secondary | ICD-10-CM

## 2015-01-23 NOTE — Patient Instructions (Signed)
Your physician wants you to follow-up in: 1 Year. You will receive a reminder letter in the mail two months in advance. If you don't receive a letter, please call our office to schedule the follow-up appointment.  If you need a refill on your cardiac medications before your next appointment, please call your pharmacy.   Merry Christmas and Happy New Year

## 2015-01-23 NOTE — Progress Notes (Signed)
HPI The patient presents for followup of CHB.   He has been in atrial fibrillation. He tolerates his complete heart block. Since I last saw him he has had no new cardiovascular complaints. He tolerated inguinal hernia repair.  He denies palpitations, presyncope or syncope. He has had no chest pressure, neck or arm discomfort. He remains active. He is tolerating his blood thinner. He's had no weight gain or edema. He has never required a pacemaker.    Allergies  Allergen Reactions  . Doxycycline     Upset stomach  . Keflex [Cephalexin]     Abdominal disruption    Current Outpatient Prescriptions  Medication Sig Dispense Refill  . lisinopril-hydrochlorothiazide (PRINZIDE,ZESTORETIC) 10-12.5 MG tablet TAKE 1 TABLET BY MOUTH DAILY 60 tablet 0  . Misc Natural Products (DAILY HERBS PROSTATE PO) Take 1 tablet by mouth daily.    . Multiple Vitamins-Minerals (MULTIVITAMIN PO) Take 1 tablet by mouth daily.    . Probiotic Product (PROBIOTIC DAILY PO) Take 1 tablet by mouth daily.    . rivaroxaban (XARELTO) 20 MG TABS tablet Take 1 tablet (20 mg total) by mouth daily. 30 tablet 1  . tamsulosin (FLOMAX) 0.4 MG CAPS capsule TAKE 1 CAPSULE (0.4 MG TOTAL) BY MOUTH DAILY. 90 capsule 0   No current facility-administered medications for this visit.    Past Medical History  Diagnosis Date  . Hypertension   . Complete heart block ([place])   . NSTEMI (non-ST elevated myocardial infarction) ([place])   . Talar fracture     casted no surg  . CAD (coronary artery disease)     LHC 03/16/11: Mid LAD 10-20%, proximal circumflex 10%, mid RCA 20%, EF 55%.  . Atrial fibrillation ([place])   . Hx of echocardiogram     Echo 02/2011: EF 65-70%, normal wall motion, MAC, mild MR, mild LAE, mild RVE  . OSA (obstructive sleep apnea)   . BPH (benign prostatic hyperplasia) 2014    Urology Dr [name] with [facility] [phone]   . Balance problem 10/31/2013  . GERD (gastroesophageal reflux disease)     takes occasional  zantac    . Arthritis     Past Surgical History  Procedure Laterality Date  . Hemmorhoidectomy    . Colonoscopy      Repeated and normal in 2011  . Prostate biopsy      negative - [facility]  . Left heart catheterization with coronary angiogram N/A 03/16/2011    Procedure: LEFT HEART CATHETERIZATION WITH CORONARY ANGIOGRAM;  Surgeon: Peter M Martinique, MD;  Location[facility] CATH LAB;  Service: Cardiovascular;  Laterality: N/A;  . Inguinal hernia repair Right 02/26/2014    Procedure: LAPAROSCOPIC RIGHT INGUINAL HERNIA REPAIR;  Surgeon: Ralene Ok, MD;  Location: [place];  Service: General;  Laterality: Right;  . Insertion of mesh Right 02/26/2014    Procedure: INSERTION OF MESH;  Surgeon: Ralene Ok, MD;  Location: [place];  Service: General;  Laterality: Right;    ROS:  As stated in the HPI and negative for all other systems.  PHYSICAL EXAM BP 172/84 mmHg  Pulse 36  Ht 6' (1.829 m)  Wt 181 lb 9.6 oz (82.373 kg)  BMI 24.62 kg/m2 GENERAL:  Well appearing, in looking younger than stated age NECK:  No jugular venous distention, waveform within normal limits, carotid upstroke brisk and symmetric, no bruits, no thyromegaly LYMPHATICS:  No cervical, inguinal adenopathy LUNGS:  Clear to auscultation bilaterally CHEST:  Unremarkable HEART:  PMI not displaced  or sustained,S1 and S2 within normal limits, no S3, no clicks, no rubs, no murmurs ABD:  Flat, positive bowel sounds normal in frequency in pitch, no bruits, no rebound, no guarding, no midline pulsatile mass, no hepatomegaly, no splenomegaly  EKG:   Atrial fibrillation with complete heart block, right bundle branch block, junctional escape rhythm rate 36.   01/23/2015  ASSESSMENT AND PLAN   ATRIAL FIBRILLATION:  He tolerates this rhythm. He is tolerating anticoagulation. There is no plan for cardioversion.  He will remain on Xarelto.  COMPLETE HEART BLOCK:  All of these years later he still has no indication for pacemaker.  HTN: his  blood pressure is controlled at home with systolics in the [id number] - [id number] range.  He will keep a blood pressure diary and I will make adjustments if he is running above 100.  NQWMI:    The patient has had no further symptoms. He had minimal coronary disease on catheterization in 2013.  No further evaluation is indicated

## 2015-02-17 ENCOUNTER — Other Ambulatory Visit: Payer: Self-pay | Admitting: Cardiology

## 2015-03-17 ENCOUNTER — Telehealth: Payer: Self-pay | Admitting: Cardiology

## 2015-03-17 NOTE — Telephone Encounter (Signed)
Left message for pt, samples of xarelto placed at the front desk for pick up if he would like

## 2015-03-17 NOTE — Telephone Encounter (Signed)
°*  STAT* If patient is at the pharmacy, call can be transferred to refill team.   1. Which medications need to be refilled? (please list name of each medication and dose if known) Xarelto-he just wants enough until his mail order comes  2. Which pharmacy/location (including street and city if local pharmacy) is medication to be sent to?[facility] [phone]  3. Do they need a 30 day or 90 day supply? 5

## 2015-03-19 ENCOUNTER — Other Ambulatory Visit: Payer: Self-pay | Admitting: Cardiology

## 2015-05-26 ENCOUNTER — Other Ambulatory Visit: Payer: Self-pay | Admitting: Pharmacist Clinician (PhC)/ Clinical Pharmacy Specialist

## 2015-05-26 MED ORDER — RIVAROXABAN 20 MG PO TABS: 20.0000 mg | ORAL_TABLET | Freq: Every day | ORAL | Status: DC

## 2015-06-18 DIAGNOSIS — H353221 Exudative age-related macular degeneration, left eye, with active choroidal neovascularization: Secondary | ICD-10-CM | POA: Diagnosis not present

## 2015-07-11 DIAGNOSIS — L218 Other seborrheic dermatitis: Secondary | ICD-10-CM | POA: Diagnosis not present

## 2015-07-11 DIAGNOSIS — L821 Other seborrheic keratosis: Secondary | ICD-10-CM | POA: Diagnosis not present

## 2015-07-11 DIAGNOSIS — Z85828 Personal history of other malignant neoplasm of skin: Secondary | ICD-10-CM | POA: Diagnosis not present

## 2015-07-11 DIAGNOSIS — L570 Actinic keratosis: Secondary | ICD-10-CM | POA: Diagnosis not present

## 2015-07-11 DIAGNOSIS — L820 Inflamed seborrheic keratosis: Secondary | ICD-10-CM | POA: Diagnosis not present

## 2015-07-11 DIAGNOSIS — L853 Xerosis cutis: Secondary | ICD-10-CM | POA: Diagnosis not present

## 2015-07-11 DIAGNOSIS — D1801 Hemangioma of skin and subcutaneous tissue: Secondary | ICD-10-CM | POA: Diagnosis not present

## 2015-07-23 DIAGNOSIS — H353221 Exudative age-related macular degeneration, left eye, with active choroidal neovascularization: Secondary | ICD-10-CM | POA: Diagnosis not present

## 2015-08-20 DIAGNOSIS — H353221 Exudative age-related macular degeneration, left eye, with active choroidal neovascularization: Secondary | ICD-10-CM | POA: Diagnosis not present

## 2015-08-30 ENCOUNTER — Ambulatory Visit (INDEPENDENT_AMBULATORY_CARE_PROVIDER_SITE_OTHER): Payer: PPO | Admitting: Physician Assistant

## 2015-08-30 VITALS — BP 128/80 | HR 37 | Temp 97.5°F | Resp 18 | Ht 71.0 in | Wt 182.2 lb

## 2015-08-30 DIAGNOSIS — R300 Dysuria: Secondary | ICD-10-CM | POA: Diagnosis not present

## 2015-08-30 LAB — POCT URINALYSIS DIP (MANUAL ENTRY)
BILIRUBIN UA: NEGATIVE
BILIRUBIN UA: NEGATIVE
Blood, UA: NEGATIVE
Glucose, UA: NEGATIVE
LEUKOCYTES UA: NEGATIVE
NITRITE UA: NEGATIVE
PH UA: 5.5
Protein Ur, POC: NEGATIVE
Spec Grav, UA: 1.01
Urobilinogen, UA: 0.2

## 2015-08-30 LAB — POC MICROSCOPIC URINALYSIS (UMFC): Mucus: ABSENT

## 2015-08-30 NOTE — Progress Notes (Signed)
Patient ID: Chad Russell, male    DOB: April 25, 1926, 82 y.o.   MRN: [id number]  PCP: [name], MD  Subjective:   Chief Complaint  Patient presents with  . Urinary Tract Infection    C/O fatigue & urinary stinging x 2-3 days    HPI Presents for evaluation of possible UTI.  Reports mild fatigue and urinary stinging x 2-3 days. No increased frequency or urgency. No hematuria. He would normally have contacted his urologist, but that office is not open on Saturdays.  No fever, chills. No abdominal or back pain. No nausea, vomiting. Did have a little GI discomfort a few days ago, but that is resolved. No worsening of the chronic balance problem.  Hospitalized for septicemia due to UTI 04/2012.     Review of Systems As above.    Patient Active Problem List   Diagnosis Date Noted  . Balance problem 10/31/2013  . OSA (obstructive sleep apnea) 10/31/2013  . BPH (benign prostatic hyperplasia) 08/28/2012  . Atrial fibrillation ([place]) 06/08/2012  . Bacteremia 04/28/2012  . Hyperplasia of prostate with lower urinary tract symptoms (LUTS) 04/28/2012  . Calf pain 04/28/2012  . Moderate malnutrition ([place]) 04/28/2012  . Influenza-like illness 04/27/2012  . Infection of urinary tract - recurrent 04/26/2012  . NSTEMI (non-ST elevated myocardial infarction) ([place]) 03/15/2011  . Complete heart block ([place]) 03/15/2011  . HTN (hypertension) 03/15/2011     Prior to Admission medications   Medication Sig Start Date End Date Taking? Authorizing Provider  lisinopril-hydrochlorothiazide (PRINZIDE,ZESTORETIC) 10-12.5 MG tablet Take 1 tablet by mouth daily. 02/18/15  Yes Minus Breeding, MD  Misc Natural Products (DAILY HERBS PROSTATE PO) Take 1 tablet by mouth daily.   Yes Historical Provider, MD  Multiple Vitamins-Minerals (MULTIVITAMIN PO) Take 1 tablet by mouth daily.   Yes Historical Provider, MD  Probiotic Product (PROBIOTIC DAILY PO) Take 1 tablet by mouth daily.   Yes  Historical Provider, MD  rivaroxaban (XARELTO) 20 MG TABS tablet Take 1 tablet (20 mg total) by mouth daily with supper. 05/26/15  Yes Minus Breeding, MD  tamsulosin (FLOMAX) 0.4 MG CAPS capsule TAKE 1 CAPSULE (0.4 MG TOTAL) BY MOUTH DAILY. 11/23/12  Yes [name], MD     Allergies  Allergen Reactions  . Doxycycline     Upset stomach  . Keflex [Cephalexin]     Abdominal disruption       Objective:  Physical Exam  Constitutional: He is oriented to person, place, and time. He appears well-developed and well-nourished. He is active and cooperative. No distress.  BP 128/80 mmHg  Pulse 37  Temp(Src) 97.5 F (36.4 C) (Oral)  Resp 18  Ht 5\' 11"  (1.803 m)  Wt 182 lb 4 oz (82.668 kg)  BMI 25.43 kg/m2  SpO2 98%  HENT:  Head: Normocephalic and atraumatic.  Right Ear: Hearing normal.  Left Ear: Hearing normal.  Eyes: Conjunctivae are normal. No scleral icterus.  Neck: Normal range of motion. Neck supple. No thyromegaly present.  Cardiovascular: Regular rhythm and normal heart sounds.  Bradycardia present.   Pulses:      Radial pulses are 2+ on the right side, and 2+ on the left side.  Pulmonary/Chest: Effort normal and breath sounds normal.  Abdominal: Normal appearance and bowel sounds are normal. He exhibits no mass. There is no tenderness. There is no CVA tenderness.  Lymphadenopathy:       Head (right side): No tonsillar, no preauricular, no posterior auricular and no occipital adenopathy present.  Head (left side): No tonsillar, no preauricular, no posterior auricular and no occipital adenopathy present.    He has no cervical adenopathy.       Right: No supraclavicular adenopathy present.       Left: No supraclavicular adenopathy present.  Neurological: He is alert and oriented to person, place, and time. No sensory deficit.  Skin: Skin is warm, dry and intact. No rash noted. No cyanosis or erythema. Nails show no clubbing.  Psychiatric: He has a normal mood and  affect. His speech is normal and behavior is normal.    Results for orders placed or performed in visit on 08/30/15  POCT Microscopic Urinalysis ([facility])  Result Value Ref Range   WBC,UR,HPF,POC None None WBC/hpf   RBC,UR,HPF,POC None None RBC/hpf   Bacteria None None, Too numerous to count   Mucus Absent Absent   Epithelial Cells, UR Per Microscopy None None, Too numerous to count cells/hpf  POCT urinalysis dipstick  Result Value Ref Range   Color, UA yellow yellow   Clarity, UA clear clear   Glucose, UA negative negative   Bilirubin, UA negative negative   Ketones, POC UA negative negative   Spec Grav, UA 1.010    Blood, UA negative negative   pH, UA 5.5    Protein Ur, POC negative negative   Urobilinogen, UA 0.2    Nitrite, UA Negative Negative   Leukocytes, UA Negative Negative          Assessment & Plan:   1. Burning with urination No evidence of infection on labs today. He doesn't want an antibiotic if he doesn't need it, and so we agree to wait on the culture. Certainly, if his symptoms escalate, would initiate antibiotic treatment. - POCT Microscopic Urinalysis ([facility]) - POCT urinalysis dipstick - Urine culture   Fara Chute, PA-C Physician Assistant-Certified [facility] [place]

## 2015-08-30 NOTE — Patient Instructions (Signed)
     IF you received an x-ray today, you will receive an invoice from [facility]. Please contact [facility] at [phone] with questions or concerns regarding your invoice.   IF you received labwork today, you will receive an invoice from [facility]. Please contact [facility] at [phone] with questions or concerns regarding your invoice.   Our billing staff will not be able to assist you with questions regarding bills from these companies.  You will be contacted with the lab results as soon as they are available. The fastest way to get your results is to activate your My Chart account. Instructions are located on the last page of this paperwork. If you have not heard from us regarding the results in 2 weeks, please contact this office.      

## 2015-08-31 LAB — URINE CULTURE
Colony Count: NO GROWTH
Organism ID, Bacteria: NO GROWTH

## 2015-09-02 ENCOUNTER — Telehealth: Payer: Self-pay | Admitting: Emergency Medicine

## 2015-09-02 NOTE — Telephone Encounter (Signed)
Pt given lab results Sx's resolved as of this time Instructed to f/u with Urology if sx's persists

## 2015-09-02 NOTE — Telephone Encounter (Signed)
-----   Message from Harrison Mons, [place] sent at 09/01/2015  5:17 PM EDT ----- Please call this patient. The UCx was NEGATIVE. If his symptoms persist, he should follow up here or with urology.

## 2015-09-23 DIAGNOSIS — H353221 Exudative age-related macular degeneration, left eye, with active choroidal neovascularization: Secondary | ICD-10-CM | POA: Diagnosis not present

## 2015-09-23 DIAGNOSIS — H353133 Nonexudative age-related macular degeneration, bilateral, advanced atrophic without subfoveal involvement: Secondary | ICD-10-CM | POA: Diagnosis not present

## 2015-10-09 ENCOUNTER — Encounter: Payer: Self-pay | Admitting: Family Medicine

## 2015-10-20 ENCOUNTER — Encounter: Payer: Self-pay | Admitting: Cardiology

## 2015-10-29 DIAGNOSIS — H353221 Exudative age-related macular degeneration, left eye, with active choroidal neovascularization: Secondary | ICD-10-CM | POA: Diagnosis not present

## 2015-11-02 NOTE — Progress Notes (Signed)
HPI The patient presents for followup of CHB.   He has been in atrial fibrillation. He tolerates his complete heart block. Since I last saw him he has  had no new cardiovascular complaints.   He denies palpitations, presyncope or syncope. He has had no chest pressure, neck or arm discomfort. He remains active. He is tolerating his blood thinner. He's had no weight gain or edema. He has never required a pacemaker.    Allergies  Allergen Reactions  . Doxycycline     Upset stomach  . Keflex [Cephalexin]     Abdominal disruption    Current Outpatient Prescriptions  Medication Sig Dispense Refill  . lisinopril-hydrochlorothiazide (PRINZIDE,ZESTORETIC) 10-12.5 MG tablet Take 1 tablet by mouth daily. 90 tablet 3  . Misc Natural Products (DAILY HERBS PROSTATE PO) Take 1 tablet by mouth daily.    . Multiple Vitamins-Minerals (MULTIVITAMIN PO) Take 1 tablet by mouth daily.    . Probiotic Product (PROBIOTIC DAILY PO) Take 1 tablet by mouth daily.    . rivaroxaban (XARELTO) 20 MG TABS tablet Take 1 tablet (20 mg total) by mouth daily with supper. 90 tablet 1  . tamsulosin (FLOMAX) 0.4 MG CAPS capsule TAKE 1 CAPSULE (0.4 MG TOTAL) BY MOUTH DAILY. (Patient taking differently: TAKE 1/2 CAPSULE (0.2 MG TOTAL) BY MOUTH DAILY.) 90 capsule 0   No current facility-administered medications for this visit.     Past Medical History:  Diagnosis Date  . Arthritis   . Atrial fibrillation ([place])   . Balance problem 10/31/2013  . BPH (benign prostatic hyperplasia) 2014   Urology Dr Chad Russell with [facility] [phone]   . CAD (coronary artery disease)    LHC 03/16/11: Mid LAD 10-20%, proximal circumflex 10%, mid RCA 20%, EF 55%.  . Complete heart block ([place])   . GERD (gastroesophageal reflux disease)    takes occasional  zantac  . Hx of echocardiogram    Echo 02/2011: EF 65-70%, normal wall motion, MAC, mild MR, mild LAE, mild RVE  . Hypertension   . NSTEMI (non-ST elevated myocardial infarction) ([place])   . OSA  (obstructive sleep apnea)   . Talar fracture    casted no surg    Past Surgical History:  Procedure Laterality Date  . COLONOSCOPY     Repeated and normal in 2011  . hemmorhoidectomy    . INGUINAL HERNIA REPAIR Right 02/26/2014   Procedure: LAPAROSCOPIC RIGHT INGUINAL HERNIA REPAIR;  Surgeon: Ralene Ok, MD;  Location: [place];  Service: General;  Laterality: Right;  . INSERTION OF MESH Right 02/26/2014   Procedure: INSERTION OF MESH;  Surgeon: Ralene Ok, MD;  Location: [place];  Service: General;  Laterality: Right;  . LEFT HEART CATHETERIZATION WITH CORONARY ANGIOGRAM N/A 03/16/2011   Procedure: LEFT HEART CATHETERIZATION WITH CORONARY ANGIOGRAM;  Surgeon: Peter M Martinique, MD;  Location: [facility] CATH LAB;  Service: Cardiovascular;  Laterality: N/A;  . PROSTATE BIOPSY     negative - [facility]    ROS:  Mild gait problem.  Otherwise as stated in the HPI and negative for all other systems.  PHYSICAL EXAM BP (!) 158/80   Pulse (!) 34   Ht 6' (1.829 m)   Wt 178 lb (80.7 kg)   BMI 24.14 kg/m  GENERAL:  Well appearing, in looking younger than stated age NECK:  No jugular venous distention, waveform within normal limits, carotid upstroke brisk and symmetric, no bruits, no thyromegaly LYMPHATICS:  No cervical, inguinal adenopathy LUNGS:  Clear to  auscultation bilaterally CHEST:  Unremarkable HEART:  PMI not displaced or sustained,S1 and S2 within normal limits, no S3, no clicks, no rubs, no murmurs ABD:  Flat, positive bowel sounds normal in frequency in pitch, no bruits, no rebound, no guarding, no midline pulsatile mass, no hepatomegaly, no splenomegaly   ASSESSMENT AND PLAN   ATRIAL FIBRILLATION:  He tolerates this rhythm. He is tolerating anticoagulation. There is no plan for cardioversion.  He will remain on Xarelto.  Chad Russell has a CHA2DS2 - VASc score of 3 with a risk of stroke of 3.2%.  COMPLETE HEART BLOCK:  All of these years later he still has no  indication for pacemaker.  HTN: his blood pressure is controlled at home with systolics in the [id number] - [id number] range.  No change in therapy is indicated.   NQWMI:    The patient has had no further symptoms. He had minimal coronary disease on catheterization in 2013.  No further evaluation is indicated

## 2015-11-03 ENCOUNTER — Encounter: Payer: Self-pay | Admitting: Cardiology

## 2015-11-03 ENCOUNTER — Ambulatory Visit (INDEPENDENT_AMBULATORY_CARE_PROVIDER_SITE_OTHER): Payer: PPO | Admitting: Cardiology

## 2015-11-03 ENCOUNTER — Other Ambulatory Visit: Payer: Self-pay

## 2015-11-03 VITALS — BP 158/80 | HR 34 | Ht 72.0 in | Wt 178.0 lb

## 2015-11-03 DIAGNOSIS — I481 Persistent atrial fibrillation: Secondary | ICD-10-CM | POA: Diagnosis not present

## 2015-11-03 DIAGNOSIS — I442 Atrioventricular block, complete: Secondary | ICD-10-CM

## 2015-11-03 DIAGNOSIS — I4819 Other persistent atrial fibrillation: Secondary | ICD-10-CM

## 2015-11-03 NOTE — Patient Instructions (Signed)
Medication Instructions:  Continue current medications  Labwork: None ordered  Testing/Procedures: None ordered  Follow-Up: Your physician wants you to follow-up in: 1 Year. You will receive a reminder letter in the mail two months in advance. If you don't receive a letter, please call our office to schedule the follow-up appointment.   Any Other Special Instructions Will Be Listed Below (If Applicable).   If you need a refill on your cardiac medications before your next appointment, please call your pharmacy.

## 2015-11-04 MED ORDER — RIVAROXABAN 20 MG PO TABS: 20.0000 mg | ORAL_TABLET | Freq: Every day | ORAL | 1 refills | Status: DC

## 2015-11-06 ENCOUNTER — Other Ambulatory Visit: Payer: Self-pay | Admitting: *Deleted

## 2015-11-06 MED ORDER — RIVAROXABAN 20 MG PO TABS: 20.0000 mg | ORAL_TABLET | Freq: Every day | ORAL | 1 refills | Status: DC

## 2015-11-18 ENCOUNTER — Ambulatory Visit: Payer: Self-pay | Admitting: Physician Assistant

## 2015-12-04 DIAGNOSIS — H4052X2 Glaucoma secondary to other eye disorders, left eye, moderate stage: Secondary | ICD-10-CM | POA: Diagnosis not present

## 2015-12-04 DIAGNOSIS — H353221 Exudative age-related macular degeneration, left eye, with active choroidal neovascularization: Secondary | ICD-10-CM | POA: Diagnosis not present

## 2015-12-11 ENCOUNTER — Encounter: Payer: Self-pay | Admitting: Family Medicine

## 2015-12-11 ENCOUNTER — Ambulatory Visit (INDEPENDENT_AMBULATORY_CARE_PROVIDER_SITE_OTHER): Payer: PPO | Admitting: Family Medicine

## 2015-12-11 VITALS — BP 130/88 | HR 32 | Temp 97.6°F | Resp 16 | Ht 71.0 in | Wt 175.4 lb

## 2015-12-11 DIAGNOSIS — M6281 Muscle weakness (generalized): Secondary | ICD-10-CM | POA: Diagnosis not present

## 2015-12-11 DIAGNOSIS — R972 Elevated prostate specific antigen [PSA]: Secondary | ICD-10-CM

## 2015-12-11 DIAGNOSIS — R29818 Other symptoms and signs involving the nervous system: Secondary | ICD-10-CM | POA: Diagnosis not present

## 2015-12-11 DIAGNOSIS — Z0000 Encounter for general adult medical examination without abnormal findings: Secondary | ICD-10-CM

## 2015-12-11 DIAGNOSIS — I442 Atrioventricular block, complete: Secondary | ICD-10-CM

## 2015-12-11 DIAGNOSIS — N400 Enlarged prostate without lower urinary tract symptoms: Secondary | ICD-10-CM

## 2015-12-11 DIAGNOSIS — R634 Abnormal weight loss: Secondary | ICD-10-CM

## 2015-12-11 DIAGNOSIS — Z131 Encounter for screening for diabetes mellitus: Secondary | ICD-10-CM

## 2015-12-11 DIAGNOSIS — Z1329 Encounter for screening for other suspected endocrine disorder: Secondary | ICD-10-CM

## 2015-12-11 LAB — COMPLETE METABOLIC PANEL WITH GFR
ALBUMIN: 4.2 g/dL (ref 3.6–5.1)
ALK PHOS: 42 U/L (ref 40–115)
ALT: 16 U/L (ref 9–46)
AST: 20 U/L (ref 10–35)
BILIRUBIN TOTAL: 1.1 mg/dL (ref 0.2–1.2)
BUN: 18 mg/dL (ref 7–25)
CALCIUM: 9.4 mg/dL (ref 8.6–10.3)
CO2: 29 mmol/L (ref 20–31)
CREATININE: 0.9 mg/dL (ref 0.70–1.11)
Chloride: 104 mmol/L (ref 98–110)
GFR, EST AFRICAN AMERICAN: 88 mL/min (ref >=60)
GFR, EST NON AFRICAN AMERICAN: 77 mL/min (ref >=60)
Glucose, Bld: 94 mg/dL (ref 65–99)
Potassium: 4.3 mmol/L (ref 3.5–5.3)
Sodium: 141 mmol/L (ref 135–146)
TOTAL PROTEIN: 6.5 g/dL (ref 6.1–8.1)

## 2015-12-11 LAB — TSH: TSH: 2.71 m[IU]/L (ref 0.40–4.50)

## 2015-12-11 LAB — CK: Total CK: 126 U/L (ref 7–232)

## 2015-12-11 MED ORDER — TAMSULOSIN HCL 0.4 MG PO CAPS: ORAL_CAPSULE | ORAL | 1 refills | Status: DC

## 2015-12-11 NOTE — Patient Instructions (Addendum)
I will check some muscle tests, other tests, and refer you to neurology to discuss the balance and muscle strength issues. Follow-up with me in the next 4-6 weeks to discuss that further.  Okay to continue one half of the Flomax up to once per day, I will recheck a prostate level.   Look at the information on advanced directives and let me know if you have any questions. Please bring a copy of that so we can scan it into your chart.  Keeping you healthy  Get these tests  Blood pressure- Have your blood pressure checked once a year by your healthcare provider.  Normal blood pressure is 120/80  Weight- Have your body mass index (BMI) calculated to screen for obesity.  BMI is a measure of body fat based on height and weight. You can also calculate your own BMI at ViewBanking.si.  Cholesterol- Have your cholesterol checked every year.  Diabetes- Have your blood sugar checked regularly if you have high blood pressure, high cholesterol, have a family history of diabetes or if you are overweight.  Screening for Colon Cancer- Colonoscopy starting at age 23.  Screening may begin sooner depending on your family history and other health conditions. Follow up colonoscopy as directed by your Gastroenterologist.  Screening for Prostate Cancer- Both blood work (PSA) and a rectal exam help screen for Prostate Cancer.  Screening begins at age 62 with African-American men and at age 87 with Caucasian men.  Screening may begin sooner depending on your family history.  Take these medicines  Aspirin- One aspirin daily can help prevent Heart disease and Stroke.  Flu shot- Every fall.  Tetanus- Every 10 years.  Zostavax- Once after the age of 22 to prevent Shingles.  Pneumonia shot- Once after the age of 63; if you are younger than 64, ask your healthcare provider if you need a Pneumonia shot.  Take these steps  Don't smoke- If you do smoke, talk to your doctor about quitting.  For tips on  how to quit, go to www.smokefree.gov or call 1-800-QUIT-NOW.  Be physically active- Exercise 5 days a week for at least 30 minutes.  If you are not already physically active start slow and gradually work up to 30 minutes of moderate physical activity.  Examples of moderate activity include walking briskly, mowing the yard, dancing, swimming, bicycling, etc.  Eat a healthy diet- Eat a variety of healthy food such as fruits, vegetables, low fat milk, low fat cheese, yogurt, lean meant, poultry, fish, beans, tofu, etc. For more information go to www.thenutritionsource.org  Drink alcohol in moderation- Limit alcohol intake to less than two drinks a day. Never drink and drive.  Dentist- Brush and floss twice daily; visit your dentist twice a year.  Depression- Your emotional health is as important as your physical health. If you're feeling down, or losing interest in things you would normally enjoy please talk to your healthcare provider.  Eye exam- Visit your eye doctor every year.  Safe sex- If you may be exposed to a sexually transmitted infection, use a condom.  Seat belts- Seat belts can save your life; always wear one.  Smoke/Carbon Monoxide detectors- These detectors need to be installed on the appropriate level of your home.  Replace batteries at least once a year.  Skin cancer- When out in the sun, cover up and use sunscreen 15 SPF or higher.  Violence- If anyone is threatening you, please tell your healthcare provider.  Living Will/ Health care power of attorney-  Speak with your healthcare provider and family.   IF you received an x-ray today, you will receive an invoice from [facility]. Please contact [facility] at [phone] with questions or concerns regarding your invoice.   IF you received labwork today, you will receive an invoice from [employer]. Please contact [facility] at [phone] with questions or concerns regarding your  invoice.   Our billing staff will not be able to assist you with questions regarding bills from these companies.  You will be contacted with the lab results as soon as they are available. The fastest way to get your results is to activate your My Chart account. Instructions are located on the last page of this paperwork. If you have not heard from [place] regarding the results in 2 weeks, please contact this office.

## 2015-12-11 NOTE — Progress Notes (Signed)
By signing my name below, I, Mesha Guinyard, attest that this documentation has been prepared under the direction and in the presence of [name], MD.  Electronically Signed: Verlee Monte, Medical Scribe. 12/11/15. 2:14 PM.  Subjective:    Patient ID: Chad Russell, male    DOB: Oct 19, 1926, 82 y.o.   MRN: [id number]  HPI Chief Complaint  Patient presents with  . Annual Exam  . Medication Refill    Tamsulosin HCI Flomax 0.4 mg    HPI Comments: Chad Russell is a 82 y.o. male who presents to the [facility] for medicare wellness exam and follow-up. Last visit with me was in June 2016. He has a hx of complete heart block with A. Fib, followed by Dr. Percival Spanish and historically he tolerated his complete heart block. Last cardiologist visit was a month ago, per that visit he was still tolerating his heart block without pacemaker, without new CV complaints, he was kept on xarelto for his a. fib, and hx of non q-wave MI with minimal CAD on cath on 2013. No changes from that visit. He has had some fatigue in the past. See my discussion in June 2016 regarding tick borne illness, based on pt's research concerned about lyme disease with hx of fatigue, hair loss, imblanance and hx of motion perception difficulty. He had a negative lyme antibody IGM and IG on August 16 2014. At that time his TSH and CMP were nl, hemoglobin was nl, PSA was elevated but lower than 2 years prior, and advised at that time to discuss his fatigue with cardiology. He's had a hx of balance difficulty and has been evaluated by neuro. Most recent visit seen with Dr. Rexene Alberts Oct 2015. He had a brain MRI without contrast in Sept 2015 without any acute abnormalities. With his balance issues that was reportedly there for 5 years when he was seen by neurology. Was recommended to hydrate and to continue exercise in moderation, recommended to 1 glass of wine per day, and follow-up PRN. He did have a sleep study that  did not indicated significant OSA. Pt is fasting.  HTN: Compliant with lisinopril-HCTZ. Lab Results  Component Value Date   CREATININE 0.85 01/20/2015   Cancer Screening: Prostate CA: Pt takes flomax .2 mg daily. Pt last saw the urologist last year. Pt's prostate biopsy was all negative. Pt was dx with prostate CA in the "early 90s" Lab Results  Component Value Date   PSA 7.32 (H) 08/16/2014   PSA 11.70 (H) 04/27/2012   Immunizations: Pt received pneumovax. Immunization History  Administered Date(s) Administered  . Influenza Nasal 11/05/2012  . Influenza Split 11/15/2011  . Influenza,inj,Quad PF,36+ Mos 12/04/2013, 12/06/2014  . Influenza-Unspecified 11/19/2015  . Pneumococcal Conjugate-13 03/13/2015   Vision:  Visual Acuity Screening   Right eye Left eye Both eyes  Without correction:     With correction: 20/40 20/40 20/30    Gait: Push off with his left foot, but his right foot won't push back. Pt states his balance is "screwed up". Pt mentions he was working on the flat roof but understands that he shouldn't go on the roof or climb the ladder.  Exercise: Pt walks in the bog garden with a walking stick for exercise. Pt would like to ski this winter and he entered in an exercise group. Pt states strength, but primarily balance is preventing him from skiing. Pt has been gradually losing weight since he started losing his strength and balance over the  past 7 years. Pt denies chest pain, SOB, fever, chills, night sweats, bloody stool, abdominal pain, and melena.  Depression Screening: Depression screen [facility] 2/9 12/11/2015 08/30/2015 08/16/2014  Decreased Interest 0 0 0  Down, Depressed, Hopeless 0 0 0  PHQ - 2 Score 0 0 0   Advance Directives: He does not have an advance directive.  Fall Screening: No falls in the past year Fall Risk  12/11/2015 08/30/2015  Falls in the past year? No No   Functional Status Survey: Is the patient deaf or have difficulty hearing?: No Does the  patient have difficulty seeing, even when wearing glasses/contacts?: No Does the patient have difficulty concentrating, remembering, or making decisions?: No Does the patient have difficulty walking or climbing stairs?: No Does the patient have difficulty dressing or bathing?: No Does the patient have difficulty doing errands alone such as visiting a doctor's office or shopping?: No  Patient Active Problem List   Diagnosis Date Noted  . Balance problem 10/31/2013  . OSA (obstructive sleep apnea) 10/31/2013  . BPH (benign prostatic hyperplasia) 08/28/2012  . Atrial fibrillation ([place]) 06/08/2012  . Bacteremia 04/28/2012  . Hyperplasia of prostate with lower urinary tract symptoms (LUTS) 04/28/2012  . Calf pain 04/28/2012  . Moderate malnutrition ([place]) 04/28/2012  . Influenza-like illness 04/27/2012  . Infection of urinary tract - recurrent 04/26/2012  . NSTEMI (non-ST elevated myocardial infarction) ([place]) 03/15/2011  . Complete heart block ([place]) 03/15/2011  . HTN (hypertension) 03/15/2011   Past Medical History:  Diagnosis Date  . Arthritis   . Atrial fibrillation ([place])   . Balance problem 10/31/2013  . BPH (benign prostatic hyperplasia) 2014   Urology Dr [name] with [facility] [phone]   . CAD (coronary artery disease)    LHC 03/16/11: Mid LAD 10-20%, proximal circumflex 10%, mid RCA 20%, EF 55%.  . Cataract   . Complete heart block ([place])   . GERD (gastroesophageal reflux disease)    takes occasional  zantac  . Hx of echocardiogram    Echo 02/2011: EF 65-70%, normal wall motion, MAC, mild MR, mild LAE, mild RVE  . Hypertension   . NSTEMI (non-ST elevated myocardial infarction) ([place])   . OSA (obstructive sleep apnea)   . Talar fracture    casted no surg   Past Surgical History:  Procedure Laterality Date  . COLONOSCOPY     Repeated and normal in 2011  . hemmorhoidectomy    . INGUINAL HERNIA REPAIR Right 02/26/2014   Procedure: LAPAROSCOPIC RIGHT INGUINAL HERNIA REPAIR;  Surgeon:  Ralene Ok, MD;  Location: [facility];  Service: General;  Laterality: Right;  . INSERTION OF MESH Right 02/26/2014   Procedure: INSERTION OF MESH;  Surgeon: Ralene Ok, MD;  Location: [facility];  Service: General;  Laterality: Right;  . LEFT HEART CATHETERIZATION WITH CORONARY ANGIOGRAM N/A 03/16/2011   Procedure: LEFT HEART CATHETERIZATION WITH CORONARY ANGIOGRAM;  Surgeon: Peter M Martinique, MD;  Location: [facility] CATH LAB;  Service: Cardiovascular;  Laterality: N/A;  . PROSTATE BIOPSY     negative - [facility]   Allergies  Allergen Reactions  . Doxycycline     Upset stomach  . Keflex [Cephalexin]     Abdominal disruption   Prior to Admission medications   Medication Sig Start Date End Date Taking? Authorizing Provider  lisinopril-hydrochlorothiazide (PRINZIDE,ZESTORETIC) 10-12.5 MG tablet Take 1 tablet by mouth daily. 02/18/15   Minus Breeding, MD  Misc Natural Products (DAILY HERBS PROSTATE PO) Take 1 tablet by mouth daily.  Historical Provider, MD  Multiple Vitamins-Minerals (MULTIVITAMIN PO) Take 1 tablet by mouth daily.    Historical Provider, MD  Probiotic Product (PROBIOTIC DAILY PO) Take 1 tablet by mouth daily.    Historical Provider, MD  rivaroxaban (XARELTO) 20 MG TABS tablet Take 1 tablet (20 mg total) by mouth daily with supper. 11/06/15   Minus Breeding, MD  tamsulosin (FLOMAX) 0.4 MG CAPS capsule TAKE 1 CAPSULE (0.4 MG TOTAL) BY MOUTH DAILY. Patient taking differently: TAKE 1/2 CAPSULE (0.2 MG TOTAL) BY MOUTH DAILY. 11/23/12   [name], MD   Social History   Social History  . Marital status: Married    Spouse name: Mardene Celeste  . Number of children: 0  . Years of education: 75   Occupational History  . 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 Smoking status: Former Smoker    Packs/day: 1.00    Years: 3.00    Types: Cigarettes    Quit date: 03/17/1955  . Smokeless tobacco: Former Systems developer  . Alcohol use 4.2 oz/week    7 Standard  drinks or equivalent per week     Comment: daily  . Drug use: No  . Sexual activity: No   Other Topics Concern  . 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  Neurological: Positive for weakness.    Objective:  Physical Exam  Constitutional: He is oriented to person, place, and time. He appears well-developed and well-nourished.  HENT:  Head: Normocephalic and atraumatic.  Right Ear: External ear normal.  Left Ear: External ear normal.  Mouth/Throat: Oropharynx is clear and moist.  Eyes: Conjunctivae and EOM are normal. Pupils are equal, round, and reactive to light.  Neck: Normal range of motion. Neck supple. No thyromegaly present.  Cardiovascular: Regular rhythm, normal heart sounds and intact distal pulses.  Bradycardia present.   Pulmonary/Chest: Effort normal and breath sounds normal. No respiratory distress. He has no wheezes.  Abdominal: Soft. He exhibits no distension. There is no tenderness.  Musculoskeletal: Normal range of motion. He exhibits edema (trace non pitting edema at the ankles bilaterally). He exhibits no tenderness.  Lymphadenopathy:    He has no cervical adenopathy.  Neurological: He is alert and oriented to person, place, and time. He has normal reflexes.  Reflex Scores:      Patellar reflexes are 2+ on the right side and 2+ on the left side.      Achilles reflexes are 2+ on the right side and 2+ on the left side. Appears to have minimal upper body movement but narrow based gait that appears to be overall stable Able to stand from seated position quickly without difficulty  Skin: Skin is warm and dry.  Psychiatric: He has a normal mood and affect. His behavior is normal.  Vitals reviewed.  BP 130/88   Pulse (!) 32   Temp 97.6 F (36.4 C)  (Oral)   Resp 16   Ht 5' 11"  (1.803 m)   Wt 175 lb 6.4 oz (79.6 kg)   SpO2 98%   BMI 24.46 kg/m   Wt Readings from Last 3 Encounters:  12/11/15 175 lb 6.4 oz (79.6 kg)  11/03/15 178 lb (80.7 kg)  08/30/15 182 lb  4 oz (82.7 kg)    Assessment & Plan:   Chad Russell is a 82 y.o. male Medicare annual wellness visit, subsequent  - -anticipatory guidance as below in AVS, screening labs above. Health maintenance items as above in HPI discussed/recommended as applicable.   -fall, depression and functional status assessments without any concerning responses.   -advance directive ppwk given.   Difficulty balancing - Plan: Sedimentation Rate, Ambulatory referral to Neurology, Muscle weakness-general - Plan: Sedimentation Rate, CK, Aldolase, Ambulatory referral to Neurology  - reported balance/weakness issues, but no focal findings appreciated on exam, gait overall appears WNL.    -check ESR, CPK, aldolase, and refer to different neuro for second opinion at his request.   Loss of weight - Plan: Sedimentation Rate, TSH  - check sed rate, neuro eval as above, tsh, and recheck to discuss further depending on labs.   BPH with elevated PSA - Plan: PSA, tamsulosin (FLOMAX) 0.4 MG CAPS capsule  - continue flomax - taking 1/2 dose per day, no apparent new anticholinergic side effects.   Complete heart block (HCC)  - prior asymptomatic. Followed by cardiology, without recent change in treatment.  Rtc/er precautions if fatigue or other new sx's that may be form his CHB.    Screening for diabetes mellitus - Plan: COMPLETE METABOLIC PANEL WITH GFR  Screening for thyroid disorder - Plan: TSH  Recheck in 4-6 weeks.   Meds ordered this encounter  Medications  . Docusate Sodium (COLACE PO)    Sig: Take by mouth.  . tamsulosin (FLOMAX) 0.4 MG CAPS capsule    Sig: TAKE 1/2 CAPSULE (0.2 MG TOTAL) BY MOUTH DAILY.    Dispense:  90 capsule    Refill:  1   Patient Instructions    I will check  some muscle tests, other tests, and refer you to neurology to discuss the balance and muscle strength issues. Follow-up with me in the next 4-6 weeks to discuss that further.  Okay to continue one half of the Flomax up to once per day, I will recheck a prostate level.   Look at the information on advanced directives and let me know if you have any questions. Please bring a copy of that so we can scan it into your chart.  Keeping you healthy  Get these tests  Blood pressure- Have your blood pressure checked once a year by your healthcare provider.  Normal blood pressure is 120/80  Weight- Have your body mass index (BMI) calculated to screen for obesity.  BMI is a measure of body fat based on height and weight. You can also calculate your own BMI at ViewBanking.[email or web address].  Cholesterol- Have your cholesterol checked every year.  Diabetes- Have your blood sugar checked regularly if you have high blood pressure, high cholesterol, have a family history of diabetes or if you are overweight.  Screening for Colon Cancer- Colonoscopy starting at age 81.  Screening may begin sooner depending on your family history and other health conditions. Follow up colonoscopy as directed by your Gastroenterologist.  Screening for Prostate Cancer- Both blood work (PSA) and a rectal exam help screen for Prostate Cancer.  Screening begins at age 27 with African-American men and at age 49 with Caucasian men.  Screening may begin sooner depending on your family history.  Take these medicines  Aspirin- One aspirin daily can help prevent Heart disease and Stroke.  Flu shot- Every fall.  Tetanus- Every 10 years.  Zostavax- Once after the age of 75 to prevent  Shingles.  Pneumonia shot- Once after the age of 7; if you are younger than 67, ask your healthcare provider if you need a Pneumonia shot.  Take these steps  Don't smoke- If you do smoke, talk to your doctor about quitting.  For tips on how to quit, go  to www.smokefree.gov or call 1-800-QUIT-NOW.  Be physically active- Exercise 5 days a week for at least 30 minutes.  If you are not already physically active start slow and gradually work up to 30 minutes of moderate physical activity.  Examples of moderate activity include walking briskly, mowing the yard, dancing, swimming, bicycling, etc.  Eat a healthy diet- Eat a variety of healthy food such as fruits, vegetables, low fat milk, low fat cheese, yogurt, lean meant, poultry, fish, beans, tofu, etc. For more information go to www.thenutritionsource.org  Drink alcohol in moderation- Limit alcohol intake to less than two drinks a day. Never drink and drive.  Dentist- Brush and floss twice daily; visit your dentist twice a year.  Depression- Your emotional health is as important as your physical health. If you're feeling down, or losing interest in things you would normally enjoy please talk to your healthcare provider.  Eye exam- Visit your eye doctor every year.  Safe sex- If you may be exposed to a sexually transmitted infection, use a condom.  Seat belts- Seat belts can save your life; always wear one.  Smoke/Carbon Monoxide detectors- These detectors need to be installed on the appropriate level of your home.  Replace batteries at least once a year.  Skin cancer- When out in the sun, cover up and use sunscreen 15 SPF or higher.  Violence- If anyone is threatening you, please tell your healthcare provider.  Living Will/ Health care power of attorney- Speak with your healthcare provider and family.   IF you received an x-ray today, you will receive an invoice from [facility]. Please contact [facility] at [phone] with questions or concerns regarding your invoice.   IF you received labwork today, you will receive an invoice from [employer]. Please contact [facility] at [phone] with questions or concerns regarding your invoice.   Our  billing staff will not be able to assist you with questions regarding bills from these companies.  You will be contacted with the lab results as soon as they are available. The fastest way to get your results is to activate your My Chart account. Instructions are located on the last page of this paperwork. If you have not heard from [place] regarding the results in 2 weeks, please contact this office.       I personally performed the services described in this documentation, which was scribed in my presence. The recorded information has been reviewed and considered, and addended by me as needed.   Signed,   [name], MD [facility].  12/12/15 10:22 PM

## 2015-12-12 LAB — PSA: PSA: 7.3 ng/mL — AB (ref <=4.0)

## 2015-12-12 LAB — SEDIMENTATION RATE: Sed Rate: 1 mm/hr (ref 0–20)

## 2015-12-15 ENCOUNTER — Telehealth: Payer: Self-pay | Admitting: *Deleted

## 2015-12-15 LAB — ALDOLASE

## 2015-12-15 NOTE — Telephone Encounter (Signed)
[facility] called and stated that they could not run aldolase.  Its a frozen test.  Would you like for [place] to call patient back for redraw?

## 2015-12-15 NOTE — Telephone Encounter (Signed)
No. Just CPK is okay. We can cancel the aldolase. Thanks.   -JG

## 2015-12-22 ENCOUNTER — Encounter: Payer: Self-pay | Admitting: *Deleted

## 2015-12-29 ENCOUNTER — Ambulatory Visit (INDEPENDENT_AMBULATORY_CARE_PROVIDER_SITE_OTHER): Payer: PPO | Admitting: Neurology

## 2015-12-29 ENCOUNTER — Encounter: Payer: Self-pay | Admitting: Neurology

## 2015-12-29 ENCOUNTER — Other Ambulatory Visit (INDEPENDENT_AMBULATORY_CARE_PROVIDER_SITE_OTHER): Payer: PPO

## 2015-12-29 ENCOUNTER — Other Ambulatory Visit: Payer: Self-pay | Admitting: Neurology

## 2015-12-29 VITALS — BP 120/70 | HR 43 | Ht 71.0 in | Wt 177.6 lb

## 2015-12-29 DIAGNOSIS — R259 Unspecified abnormal involuntary movements: Secondary | ICD-10-CM

## 2015-12-29 DIAGNOSIS — R2681 Unsteadiness on feet: Secondary | ICD-10-CM

## 2015-12-29 DIAGNOSIS — G609 Hereditary and idiopathic neuropathy, unspecified: Secondary | ICD-10-CM

## 2015-12-29 LAB — VITAMIN B12: Vitamin B-12: 1209 pg/mL — ABNORMAL HIGH (ref 211–911)

## 2015-12-29 NOTE — Patient Instructions (Addendum)
1.  Check blood work 2.  Nerve and muscle testing of the legs 3.  We will send a referral for balance therapy  Return to clinic in 4-5 months  [facility]  Preventing Falls in the [place] are common, often dreaded events in the lives of older people. Aside from the obvious injuries and even death that may result, falls can cause wide-ranging consequences including loss of independence, mental decline, decreased activity, and mobility. Younger people are also at risk of falling, especially those with chronic illnesses and fatigue.  Ways to reduce the risk for falling:  * Examine diet and medications. Warm foods and alcohol dilate blood vessels, which can lead to dizziness when standing. Sleep aids, antidepressants, and pain medications can also increase the likelihood of a fall.  * Get a vison exam. Poor vision, cataracts, and glaucoma increase the chances of falling.  * Check foot gear. Shoes should fit snugly and have a sturdy, nonskid sole and broad, low heel.  * Participate in a physician-approved exercise program to build and maintain muscle strength and improve balance and coordination.  * Increase vitamin D intake. Vitamin D improves muscle strength and increases the amount of calcium the body is able to absorb and deposit in bones.  How to prevent falls from common hazards:  * Floors - Remove all loose wires, cords, and throw rugs. Minimize clutter. Make sure rugs are anchored and smooth. Keep furniture in its usual place.  * Chairs - Use chairs with straight backs, armrests, and firm seats. Add firm cushions to existing pieces to add height.  * Bathroom - Install grab bars and non-skid tape in the tub or shower. Use a bathtub transfer bench or a shower chair with a back support. Use an elevated toilet seat and/or safety rails to assist standing from a low surface. Do not use towel racks or bathroom tissue holders to help you stand.  * Lighting - Make sure halls, stairways, and  entrances are well-lit. Install a night light in your bathroom or hallway. Make sure there is a light switch at the top and bottom of the staircase. Turn lights on if you get up in the middle of the night. Make sure lamps or light switches are within reach of the bed if you have to get up during the night.  * Kitchen - Install non-skid rubber mats near the sink and stove. Clean spills immediately. Store frequently used utensils, pots, and pans between waist and eye level. This helps prevent reaching and bending. Sit when getting things out of the lower cupboards.  * Living room / [place] furniture with wide spaces in between, giving enough room to move around. Establish a route through the living room that gives you something to hold onto as you walk.  * Stairs - Make sure treads, rails, and rugs are secure. Install a rail on both sides of the stairs. If stairs are a threat, it might be helpful to arrange most of your activities on the lower level to reduce the number of times you must climb the stairs.  * Entrances and doorways - Install metal handles on the walls adjacent to the doorknobs of all doors to make it more secure as you travel through the doorway.  Tips for maintaining balance:  * Keep at least one hand free at all times Try using a backpack or fanny pack to hold things rather than carrying them in your hands. Never carry objects in  both hands when walking as this interferes with keeping your balance.  * Attempt to swing both arms from front to back while walking. This might require a conscious effort if Parkinson's disease has diminished your movement. It will, however, help you to maintain balance and posture, and reduce fatigue.  * Consciously lift your feet off the ground when walking. Shuffling and dragging of the feet is a common culprit in losing your balance.  * When trying to navigate turns, use a "U" technique of facing forward and making a wide turn, rather than pivoting  sharply.  * Try to stand with your feet shoulder-length apart. When your feet are close together for any length of time, you increase your risk of losing your balance and falling.  * Do one thing at a time. Do not try to walk and accomplish another task, such as reading or looking around. The decrease in your automatic reflexes complicates motor function, so the less distraction, the better.  * Do not wear rubber or gripping soled shoes, they might "catch" on the floor and cause tripping.  * Move slowly when changing positions. Use deliberate, concentrated movements and, if needed, use a grab bar or walking aid. Count fifteen (15) seconds after standing to begin walking.  * If balance is a continuous problem, you might want to consider a walking aid such as a cane, walking stick, or walker. Once you have mastered walking with help, you may be ready to try it again on your own.  This information is provided by [facility] and is not intended to replace the medical advice of your physician or other health care providers. Please consult your physician or other health care providers for advice regarding your specific medical condition.

## 2015-12-29 NOTE — Progress Notes (Signed)
[facility] Clinic Note - Initial Visit   Date: 12/29/15  Chad Russell MRN: [id number] DOB: 04/04/1926   Dear Dr. Carlota Raspberry:  Thank you for your kind referral of Chad Russell for consultation of imbalance and generalized weakness. Although his history is well known to you, please allow [place] to reiterate it for the purpose of our medical record. The patient was accompanied to the clinic by wife who also provides collateral information.     History of Present Illness: Chad Russell is a 82 y.o. right-handed Caucasian male with atrial fibrillation on anticoagulation therapy, hypertension, and BPH  presenting for evaluation of imbalance and generalized weakness.    Starting around 2010, he began having unsteadiness when walking.  He recalls that it started suddenly after working in the yard and developing a rash to poison ivy, but he also noticed two distinct areas, which he thought were spider bites. Within a few hours, he developed visual distortion as if objects were moving slowly.  This visual disturbance improved, but since this time, he feels that his balance has always been off.  He walks independently and has not suffered any falls.  Imbalance is worse in poor lit settings, such as at night and he has placed night lights to assist him, if he needs to get up at night time use the bathroom.  He denies any numbness/tingling of the legs or low back pain.  His wife has also noticed that he tends to walk heavy with his feet.    His wife feels that his voice has become softer, movements are slower, and penmanship has become poor.  Sense of smell is good.   He drinks one glass of wine nightly. 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    Out-side paper records, electronic medical record, and images have been reviewed where available and summarized as:  Labs 12/11/2015:  CK 126, 2.71, ESR  1 Labs 08/16/2014:  Lyme negative    Past Medical History:  Diagnosis Date  . Arthritis   . Atrial fibrillation ([place])   . Balance problem 10/31/2013  . BPH (benign prostatic hyperplasia) 2014   Urology Dr [name] with [facility] [phone]   . CAD (coronary artery disease)    LHC 03/16/11: Mid LAD 10-20%, proximal circumflex 10%, mid RCA 20%, EF 55%.  . Cataract   . Complete heart block ([place])   . GERD (gastroesophageal reflux disease)    takes occasional  zantac  . Hx of echocardiogram    Echo 02/2011: EF 65-70%, normal wall motion, MAC, mild MR, mild LAE, mild RVE  . Hypertension   . NSTEMI (non-ST elevated myocardial infarction) ([place])   . OSA (obstructive sleep apnea)   . Talar fracture    casted no surg    Past Surgical History:  Procedure Laterality Date  . COLONOSCOPY     Repeated and normal in 2011  . hemmorhoidectomy    . INGUINAL HERNIA REPAIR Right 02/26/2014   Procedure: LAPAROSCOPIC RIGHT INGUINAL HERNIA REPAIR;  Surgeon: Ralene Ok, MD;  Location: [facility];  Service: General;  Laterality: Right;  . INSERTION OF MESH Right 02/26/2014   Procedure: INSERTION OF MESH;  Surgeon: Ralene Ok, MD;  Location: [facility];  Service: General;  Laterality: Right;  . LEFT HEART CATHETERIZATION WITH CORONARY ANGIOGRAM N/A 03/16/2011   Procedure: LEFT HEART CATHETERIZATION WITH CORONARY ANGIOGRAM;  Surgeon: Peter M Martinique, MD;  Location:  [facility];  Service: Cardiovascular;  Laterality: N/A;  . PROSTATE BIOPSY     negative - [facility]     Medications:  Outpatient Encounter Prescriptions as of 12/29/2015  Medication Sig Note  . Docusate Sodium (COLACE PO) Take by mouth.   Marland Kitchen lisinopril-hydrochlorothiazide (PRINZIDE,ZESTORETIC) 10-12.5 MG tablet Take 1 tablet by mouth daily.   . Misc Natural Products (DAILY HERBS PROSTATE PO) Take 1 tablet by mouth daily.   . Multiple Vitamins-Minerals (MULTIVITAMIN PO) Take 1 tablet by mouth daily.   . Probiotic Product (PROBIOTIC DAILY PO) Take  1 tablet by mouth daily.   . rivaroxaban (XARELTO) 20 MG TABS tablet Take 1 tablet (20 mg total) by mouth daily with supper.   . tamsulosin (FLOMAX) 0.4 MG CAPS capsule TAKE 1/2 CAPSULE (0.2 MG TOTAL) BY MOUTH DAILY.   Marland Kitchen timolol (TIMOPTIC) 0.5 % ophthalmic solution  12/29/2015: Received from: External Pharmacy   No facility-administered encounter medications on file as of 12/29/2015.      Allergies:  Allergies  Allergen Reactions  . Doxycycline     Upset stomach  . Keflex [Cephalexin]     Abdominal disruption    Family History: Family History  Problem Relation Age of Onset  . Multiple sclerosis Father   . Hypertension Mother     Social History: Social History  Substance Use Topics  . Smoking status: Former Smoker    Packs/day: 1.00    Years: 3.00    Types: Cigarettes    Quit date: 03/17/1955  . Smokeless tobacco: Former Systems developer  . 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   EYES: No visual changes or eye pain ENT: No hearing changes.  No history of nose bleeds.   RESPIRATORY: No cough, wheezing and shortness of breath.   CARDIOVASCULAR: Negative for chest pain, and palpitations.   GI: Negative for abdominal discomfort, blood in stools or black stools.  No recent change in bowel habits.   GU:  No history of incontinence.   MUSCLOSKELETAL: +history of joint pain or swelling.  No myalgias.   SKIN: Negative for lesions, rash, and itching.   HEMATOLOGY/ONCOLOGY: Negative for prolonged bleeding, bruising easily, and swollen nodes.  No history of cancer.   ENDOCRINE: Negative for cold or heat intolerance, polydipsia or goiter.   PSYCH:  No  depression or anxiety symptoms.   NEURO: As Above.   Vital Signs:  BP 120/70   Pulse (!) 43   Ht _0  (1.803 m)   Wt 177 lb 9 oz (80.5 kg)   SpO2 98%   BMI 24.76 kg/m    General Medical Exam:   General:  Well appearing, severely blunted affect, comfortable.   Eyes/ENT: see cranial nerve examination.   Neck: No masses appreciated.  Full range of motion without tenderness.  No carotid bruits. Respiratory:  Clear to auscultation, good air entry bilaterally.   Cardiac:  Regular rate and rhythm, no murmur.   Extremities:  No deformities, edema, or skin discoloration.  Skin:  No rashes or lesions.  Neurological Exam: MENTAL STATUS including orientation to time, place, person, recent  and remote memory, attention span and concentration, language, and fund of knowledge is normal.  Speech is not dysarthric.  CRANIAL NERVES: II:  No visual field defects.  Unremarkable fundi.   III-IV-VI: Pupils equal round and reactive to light.  Normal conjugate, extra-ocular eye movements in all directions of gaze.  No nystagmus.  No ptosis.   V:  Normal facial sensation.   VII:  Normal facial symmetry and movements.   VIII:  Normal hearing and vestibular function.   IX-X:  Normal palatal movement.   XI:  Normal shoulder shrug and head rotation.   XII:  Normal tongue strength and range of motion, no deviation or fasciculation.  MOTOR:  No atrophy, fasciculations or abnormal movements.  No pronator drift.  Tone is normal.    Right Upper Extremity:    Left Upper Extremity:    Deltoid  5/5   Deltoid  5/5   Biceps  5/5   Biceps  5/5   Triceps  5/5   Triceps  5/5   Wrist extensors  5/5   Wrist extensors  5/5   Wrist flexors  5/5   Wrist flexors  5/5   Finger extensors  5/5   Finger extensors  5/5   Finger flexors  5/5   Finger flexors  5/5   Dorsal interossei  5/5   Dorsal interossei  5/5   Abductor pollicis  5/5   Abductor pollicis  5/5   Tone (Ashworth scale)  0  Tone (Ashworth scale)  0    Right Lower Extremity:    Left Lower Extremity:    Hip flexors  5/5   Hip flexors  5/5   Hip extensors  5/5   Hip extensors  5/5   Knee flexors  5/5   Knee flexors  5/5   Knee extensors  5/5   Knee extensors  5/5   Dorsiflexors  5/5   Dorsiflexors  5/5   Plantarflexors  5/5   Plantarflexors  5/5   Toe extensors  4/5   Toe extensors  4 /5   Toe flexors  5/5   Toe flexors  5/5   Tone (Ashworth scale)  0  Tone (Ashworth scale)  0   MSRs:  Right                                                                 Left brachioradialis 2+  brachioradialis 2+  biceps 2+  biceps 2+  triceps 2+  triceps 2+  patellar 2+  patellar 2+  ankle jerk 0  ankle jerk 0  Hoffman no  Hoffman no  plantar response down  plantar response down   SENSORY: vibration is reduced to 70% at the knees, 30% at the ankles and absent at the great toe. Temperature and pin prick is also reduced distal to ankles.  There is moderate sway with Rhomberg testing.  COORDINATION/GAIT: Normal finger-to- nose-finger.   Mild bradykinesias with finger tapping and toe tapping.  Able to rise from a chair without using arms.  He walks with short steps, turns with 3 steps.  He can barely walk in his heels.  Toe walking intact.  He is unsteady with tandem gait.  IMPRESSION: Mr. Hanrahan is a 82 year-old gentleman referred for gait imbalance.  His neurological examination shows a distal predominant  large fiber peripheral neuropathy. I had extensive discussion with the patient regarding the pathogenesis, etiology, management, and natural course of neuropathy.  I would like to test for treatable causes of neuropathy. I discussed that in the vast majority of cases, despite checking for reversible causes, we are unable to find the underlying etiology and management is symptomatic.  The atypical feature about his history is that symptoms started abruptly following a skin rash/irritation in 2017 and have been gradually progressive since this time.     Of note, he does have a blunted affect with short shuffling gait, which can be seen in parkinsonism, but there is no rigidity, tremor.  We discussed a trial of sinemet going forward.  PLAN/RECOMMENDATIONS:  1.  Check SPEP with IFE, vitamin B1, vitamin B12, copper 2.  NCS/EMG of the leg 3.  PT for balance training 4.  Fall precautions discussed, especially since he is also on anticoagulation therapy for atrial fibrillation.  Fortunately, he has not suffered any falls.  Return to clinic in 5 months.   The duration of this appointment visit was 60 minutes of face-to-face time with the patient.  Greater than 50% of this time was spent in counseling, explanation of diagnosis, planning of further management, and coordination of care.   Thank you for allowing me to participate in patient's care.  If I can answer any additional questions, I would be pleased to do so.    Sincerely,    Donika K. Posey Pronto, DO

## 2015-12-30 ENCOUNTER — Ambulatory Visit (INDEPENDENT_AMBULATORY_CARE_PROVIDER_SITE_OTHER): Payer: PPO | Admitting: Neurology

## 2015-12-30 DIAGNOSIS — R2681 Unsteadiness on feet: Secondary | ICD-10-CM

## 2015-12-30 DIAGNOSIS — G609 Hereditary and idiopathic neuropathy, unspecified: Secondary | ICD-10-CM | POA: Diagnosis not present

## 2015-12-30 NOTE — Procedures (Signed)
[facility]  [address]  [place], [place] [address] Tel: [phone] Fax:  [phone] Test Date:  12/30/2015  Patient: Chad Russell DOB: 13-Jan-1927 Physician: Narda Amber, DO  Sex: Male Height: 6\' 0"  Ref Phys: Narda Amber, DO  ID#: [id number] Temp: 33.4C Technician: Jerilynn Mages. Dean   Patient Complaints: This is a 82 year old gentleman with atrial fibrillation on chronic anticoagulation referred for evaluation of gait instability.   NCV & EMG Findings: Extensive electrodiagnostic testing of the right lower extremity and additional studies of the left shows:  1. Bilateral sural and superficial peroneal sensory responses are absent. 2. Left peroneal motor response shows reduced amplitude at the extensor digitorum brevis, however, motor response recording at the tibialis anterior is within normal limits.  Bilateral tibial and the right peroneal motor responses are within normal limits. 3. Bilateral tibial H reflex studies are within normal limits. 4. Needle electrode examination was limited as the patient is on anticoagulation therapy. Chronic motor axon loss changes are seen affecting the muscles below the knee bilaterally, without accompanied active denervation.    Impression: The electrophysiologic findings are most consistent with a distal and symmetric sensorimotor polyneuropathy, axon loss in type, affecting the lower extremities.    ___________________________ Narda Amber, DO    Nerve Conduction Studies Anti Sensory Summary Table   Stim Site NR Peak (ms) Norm Peak (ms) P-T Amp (V) Norm P-T Amp  Left Sup Peroneal Anti Sensory (Ant Lat Mall)  33.4C  12 cm NR  <4.6  >3  Right Sup Peroneal Anti Sensory (Ant Lat Mall)  33.4C  12 cm NR  <4.6  >3  Left Sural Anti Sensory (Lat Mall)  33.4C  Calf NR  <4.6  >3  Right Sural Anti Sensory (Lat Mall)  33.4C  Calf NR  <4.6  >3   Motor Summary Table   Stim Site NR Onset (ms) Norm Onset (ms) O-P Amp (mV)  Norm O-P Amp Site1 Site2 Delta-0 (ms) Dist (cm) Vel (m/s) Norm Vel (m/s)  Left Peroneal Motor (Ext Dig Brev)  33.4C  Ankle    4.7 <6.0 2.0 >2.5 B Fib Ankle 8.0 34.0 43 >40  B Fib    12.7  1.4  Poplt B Fib 2.4 10.0 42 >40  Poplt    15.1  1.4         Right Peroneal Motor (Ext Dig Brev)  33.4C  Ankle    3.3 <6.0 4.2 >2.5 B Fib Ankle 7.7 35.0 45 >40  B Fib    11.0  3.9  Poplt B Fib 2.2 10.0 45 >40  Poplt    13.2  3.6         Left Peroneal TA Motor (Tib Ant)  33.4C  Fib Head    3.3 <4.5 4.1 >3 Poplit Fib Head 2.2 10.0 45 >40  Poplit    5.5  3.9         Left Tibial Motor (Abd Hall Brev)  33.4C  Ankle    3.8 <6.0 4.2 >4 Knee Ankle 10.6 43.0 41 >40  Knee    14.4  2.8         Right Tibial Motor (Abd Hall Brev)  33.4C  Ankle    3.9 <6.0 6.7 >4 Knee Ankle 9.7 42.0 43 >40  Knee    13.6  5.6          H Reflex Studies   NR H-Lat (ms) Lat Norm (ms) L-R H-Lat (ms) M-Lat (ms) HLat-MLat (ms)  Left Tibial (Gastroc)  33.4C     34.29 <35 0.68 4.49 29.80  Right Tibial (Gastroc)  33.4C     33.61 <35 0.68 4.49 29.12   EMG   Side Muscle Ins Act Fibs Psw Fasc Number Recrt Dur Dur. Amp Amp. Poly Poly. Comment  Right Gastroc Nml Nml Nml Nml 1- Rapid Some 1+ Some 1+ Nml Nml N/A  Right RectFemoris Nml Nml Nml Nml Nml Nml Nml Nml Nml Nml Nml Nml N/A  Right AntTibialis Nml Nml Nml Nml 1- Rapid Some 1+ Some 1+ Nml Nml N/A  Right BicepsFemS Nml Nml Nml Nml Nml Nml Nml Nml Nml Nml Nml Nml N/A  Left RectFemoris Nml Nml Nml Nml Nml Nml Nml Nml Nml Nml Nml Nml N/A  Left AntTibialis Nml Nml Nml Nml 1- Rapid Some 1+ Some 1+ Nml Nml N/A  Left BicepsFemS Nml Nml Nml Nml 1- Rapid Few 1+ Few 1+ Nml Nml N/A  Left Gastroc Nml Nml Nml Nml 1- Rapid Some 1+ Some 1+ Nml Nml N/A      Waveforms:

## 2015-12-31 LAB — COPPER, SERUM: COPPER: 89 ug/dL (ref 72–166)

## 2015-12-31 LAB — PROTEIN ELECTROPHORESIS, SERUM
ABNORMAL PROTEIN BAND1: 0.3 g/dL
ALPHA-2-GLOBULIN: 0.5 g/dL (ref 0.5–0.9)
Albumin ELP: 4 g/dL (ref 3.8–4.8)
Alpha-1-Globulin: 0.3 g/dL (ref 0.2–0.3)
Beta 2: 0.2 g/dL (ref 0.2–0.5)
Beta Globulin: 0.3 g/dL — ABNORMAL LOW (ref 0.4–0.6)
Gamma Globulin: 1 g/dL (ref 0.8–1.7)
Total Protein, Serum Electrophoresis: 6.3 g/dL (ref 6.1–8.1)

## 2016-01-01 LAB — IMMUNOFIXATION ELECTROPHORESIS
IGA: 206 mg/dL (ref 81–463)
IGG (IMMUNOGLOBIN G), SERUM: 1177 mg/dL (ref 694–1618)
IGM, SERUM: 43 mg/dL — AB (ref 48–271)

## 2016-01-02 LAB — VITAMIN B1: Vitamin B1 (Thiamine): 60 nmol/L — ABNORMAL HIGH (ref 8–30)

## 2016-01-05 DIAGNOSIS — R2689 Other abnormalities of gait and mobility: Secondary | ICD-10-CM | POA: Diagnosis not present

## 2016-01-05 DIAGNOSIS — M6281 Muscle weakness (generalized): Secondary | ICD-10-CM | POA: Diagnosis not present

## 2016-01-08 DIAGNOSIS — H353221 Exudative age-related macular degeneration, left eye, with active choroidal neovascularization: Secondary | ICD-10-CM | POA: Diagnosis not present

## 2016-01-08 DIAGNOSIS — H353133 Nonexudative age-related macular degeneration, bilateral, advanced atrophic without subfoveal involvement: Secondary | ICD-10-CM | POA: Diagnosis not present

## 2016-01-09 DIAGNOSIS — M6281 Muscle weakness (generalized): Secondary | ICD-10-CM | POA: Diagnosis not present

## 2016-01-09 DIAGNOSIS — R2689 Other abnormalities of gait and mobility: Secondary | ICD-10-CM | POA: Diagnosis not present

## 2016-01-12 ENCOUNTER — Other Ambulatory Visit: Payer: Self-pay | Admitting: *Deleted

## 2016-01-12 DIAGNOSIS — M6281 Muscle weakness (generalized): Secondary | ICD-10-CM | POA: Diagnosis not present

## 2016-01-12 DIAGNOSIS — R2689 Other abnormalities of gait and mobility: Secondary | ICD-10-CM | POA: Diagnosis not present

## 2016-01-12 DIAGNOSIS — G609 Hereditary and idiopathic neuropathy, unspecified: Secondary | ICD-10-CM

## 2016-01-13 ENCOUNTER — Other Ambulatory Visit: Payer: PPO

## 2016-01-13 DIAGNOSIS — G609 Hereditary and idiopathic neuropathy, unspecified: Secondary | ICD-10-CM | POA: Diagnosis not present

## 2016-01-16 LAB — IMMUNOFIXATION INTE

## 2016-01-19 LAB — PROTEIN ELECTROPHORESIS,RANDOM URN
CREATININE, URINE: 189 mg/dL (ref 20–370)
PROTEIN CREATININE RATIO: 111 mg/g{creat} (ref 22–128)
Total Protein, Urine: 21 mg/dL (ref 5–25)

## 2016-01-22 ENCOUNTER — Encounter: Payer: Self-pay | Admitting: Family Medicine

## 2016-01-22 ENCOUNTER — Ambulatory Visit (INDEPENDENT_AMBULATORY_CARE_PROVIDER_SITE_OTHER): Payer: PPO | Admitting: Family Medicine

## 2016-01-22 VITALS — BP 130/74 | HR 35 | Temp 97.3°F | Resp 16 | Ht 72.0 in | Wt 176.4 lb

## 2016-01-22 DIAGNOSIS — G609 Hereditary and idiopathic neuropathy, unspecified: Secondary | ICD-10-CM | POA: Diagnosis not present

## 2016-01-22 NOTE — Progress Notes (Signed)
By signing my name below, I, Mesha Guinyard, attest that this documentation has been prepared under the direction and in the presence of [name], MD.  Electronically Signed: Verlee Monte, Medical Scribe. 01/22/16. 10:57 AM.  Subjective:    Patient ID: Chad Russell, male    DOB: 01/20/1927, 82 y.o.   MRN: [id number]  HPI Chief Complaint  Patient presents with  . Follow-up    4-6 wks, per pt "feels fine"    HPI Comments: Chad Russell is a 82 y.o. male who presents to the [facility] for follow-up. Hx of HTN, difficulty balancing, BPH, compete heart block and peripheral neuropathy. He was most recently seen 11/6 by Dr. Posey Pronto for his neuropathy and evaluation of his balance and generalized weakness. Based on her note, had a distal predominant large fiber peripheral neuropathy. Testing for treatable causes was obtained including: Vit B12, Vit B1, copper, SPEP, and nerve conduction studies/EMG of the leg. Referred to PT for balance training, and fall precautions were discussed; had MCV and EMG on 11/7. EP testing was consistent with a distal and symmetric sensory motor polyneuropathy, axon loss type effecting lower extremities. He had a normal/high B1, B12. SPEP slightly elevated protein, UPEP was nl, planned for recheck blood work in 6 months. With his muscle weakness I did check a CPK that was nl, TSH that was nl, and CMP also nl in October.  Pt tried [facility] but they're out of network. discussed  getting a pair of orthotics from [facility] and feels like his shoes are not giving appropriate support. feels he is "working on an unstable platform". He does not have a pair of orthotics except in his ski boots.   Patient Active Problem List   Diagnosis Date Noted  . Balance problem 10/31/2013  . OSA (obstructive sleep apnea) 10/31/2013  . BPH (benign prostatic hyperplasia) 08/28/2012  . Atrial fibrillation ([place]) 06/08/2012  . Bacteremia  04/28/2012  . Hyperplasia of prostate with lower urinary tract symptoms (LUTS) 04/28/2012  . Calf pain 04/28/2012  . Moderate malnutrition ([place]) 04/28/2012  . Influenza-like illness 04/27/2012  . Infection of urinary tract - recurrent 04/26/2012  . NSTEMI (non-ST elevated myocardial infarction) ([place]) 03/15/2011  . Complete heart block ([place]) 03/15/2011  . HTN (hypertension) 03/15/2011   Past Medical History:  Diagnosis Date  . Arthritis   . Atrial fibrillation ([place])   . Balance problem 10/31/2013  . BPH (benign prostatic hyperplasia) 2014   Urology Dr [name] with [facility] [phone]   . CAD (coronary artery disease)    LHC 03/16/11: Mid LAD 10-20%, proximal circumflex 10%, mid RCA 20%, EF 55%.  . Cataract   . Complete heart block ([place])   . GERD (gastroesophageal reflux disease)    takes occasional  zantac  . Hx of echocardiogram    Echo 02/2011: EF 65-70%, normal wall motion, MAC, mild MR, mild LAE, mild RVE  . Hypertension   . NSTEMI (non-ST elevated myocardial infarction) ([place])   . OSA (obstructive sleep apnea)   . Talar fracture    casted no surg   Past Surgical History:  Procedure Laterality Date  . COLONOSCOPY     Repeated and normal in 2011  . hemmorhoidectomy    . INGUINAL HERNIA REPAIR Right 02/26/2014   Procedure: LAPAROSCOPIC RIGHT INGUINAL HERNIA REPAIR;  Surgeon: Ralene Ok, MD;  Location: [facility];  Service: General;  Laterality: Right;  . INSERTION OF MESH Right 02/26/2014   Procedure:  INSERTION OF MESH;  Surgeon: Ralene Ok, MD;  Location: [facility];  Service: General;  Laterality: Right;  . LEFT HEART CATHETERIZATION WITH CORONARY ANGIOGRAM N/A 03/16/2011   Procedure: LEFT HEART CATHETERIZATION WITH CORONARY ANGIOGRAM;  Surgeon: Peter M Martinique, MD;  Location: [facility] CATH LAB;  Service: Cardiovascular;  Laterality: N/A;  . PROSTATE BIOPSY     negative - [facility]   Allergies  Allergen Reactions  . Doxycycline     Upset stomach  . Keflex [Cephalexin]      Abdominal disruption   Prior to Admission medications   Medication Sig Start Date End Date Taking? Authorizing Provider  Docusate Sodium (COLACE PO) Take by mouth.   Yes Historical Provider, MD  lisinopril-hydrochlorothiazide (PRINZIDE,ZESTORETIC) 10-12.5 MG tablet Take 1 tablet by mouth daily. 02/18/15  Yes Minus Breeding, MD  Misc Natural Products (DAILY HERBS PROSTATE PO) Take 1 tablet by mouth daily.   Yes Historical Provider, MD  Multiple Vitamins-Minerals (MULTIVITAMIN PO) Take 1 tablet by mouth daily.   Yes Historical Provider, MD  Probiotic Product (PROBIOTIC DAILY PO) Take 1 tablet by mouth daily.   Yes Historical Provider, MD  rivaroxaban (XARELTO) 20 MG TABS tablet Take 1 tablet (20 mg total) by mouth daily with supper. 11/06/15  Yes Minus Breeding, MD  tamsulosin (FLOMAX) 0.4 MG CAPS capsule TAKE 1/2 CAPSULE (0.2 MG TOTAL) BY MOUTH DAILY. 12/11/15  Yes [name], MD  timolol (TIMOPTIC) 0.5 % ophthalmic solution  12/04/15  Yes Historical Provider, MD   Social History   Social History  . Marital status: Married    Spouse name: Mardene Celeste  . Number of children: 0  . Years of education: 48   Occupational History  . 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 Smoking status: Former Smoker    Packs/day: 1.00    Years: 3.00    Types: Cigarettes    Quit date: 03/17/1955  . Smokeless tobacco: Former Systems developer  . Alcohol use 4.2 oz/week    7 Standard drinks or equivalent per week     Comment: daily  . Drug use: No  . Sexual activity: No   Other Topics Concern  . 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  Has no children.     Review of Systems Objective:  Physical Exam  Constitutional: He appears well-developed and well-nourished. No distress.  HENT:  Head:  Normocephalic and atraumatic.  Eyes: Conjunctivae are normal.  Neck: Neck supple.  Cardiovascular: Normal rate.   Pulmonary/Chest: Effort normal.  Neurological: He is alert. He displays a negative Romberg sign.  Seated to standing less than 1 sec Slightly cautious gait, but not wide base.  Skin: Skin is warm and dry.  Psychiatric: He has a normal mood and affect. His behavior is normal.  Nursing note and vitals reviewed.  BP 130/74 (BP Location: Left Arm, Cuff Size: Normal)   Pulse (!) 35   Temp 97.3 F (36.3 C) (Oral)   Resp 16   Ht 6' (1.829 m) Comment: with shoes  Wt 176 lb 6.4 oz (80 kg)   SpO2 95%   BMI 23.92 kg/m  Assessment & Plan:  Chad Russell is a 82 y.o. male Idiopathic peripheral neuropathy -  Plan: AMB referral to sports medicine  - Continue follow-up with neurology, physical therapy. Advised to call neurologist to determine if other practice, including [facility] outpatient [facility] may be in network for balance training. Will also referred to [facility] sports medicine for assessment for orthotics. rtc precautions if worse symptoms sooner.  No orders of the defined types were placed in this encounter.  Patient Instructions    Call your neurologist to see if other physical therapy practice may be in network. [facility] outpatient [facility] therapy may be an option. I will let you know if [facility] sports medicine would be a good fit for orthotic assessment. Let me know if you have questions in the meantime. Otherwise follow-up with your neurologist as planned for further testing in 6 months and continue balance training.  Return to the clinic or go to the nearest emergency room if any of your symptoms worsen or new symptoms occur.   IF you received an x-ray today, you will receive an invoice from [facility]. Please contact [facility] at [phone] with questions or concerns regarding your invoice.   IF you received labwork today, you will receive an invoice  from [employer]. Please contact [facility] at [phone] with questions or concerns regarding your invoice.   Our billing staff will not be able to assist you with questions regarding bills from these companies.  You will be contacted with the lab results as soon as they are available. The fastest way to get your results is to activate your My Chart account. Instructions are located on the last page of this paperwork. If you have not heard from [place] regarding the results in 2 weeks, please contact this office.       I personally performed the services described in this documentation, which was scribed in my presence. The recorded information has been reviewed and considered, and addended by me as needed.   Signed,   [name], MD [facility].  01/24/16 10:09 AM

## 2016-01-22 NOTE — Patient Instructions (Addendum)
  Call your neurologist to see if other physical therapy practice may be in network. [facility] may be an option. I will let you know if [facility] would be a good fit for orthotic assessment. Let me know if you have questions in the meantime. Otherwise follow-up with your neurologist as planned for further testing in 6 months and continue balance training.  Return to the clinic or go to the nearest emergency room if any of your symptoms worsen or new symptoms occur.   IF you received an x-ray today, you will receive an invoice from [facility]. Please contact [facility] at [phone] with questions or concerns regarding your invoice.   IF you received labwork today, you will receive an invoice from [employer]. Please contact [facility] at [phone] with questions or concerns regarding your invoice.   Our billing staff will not be able to assist you with questions regarding bills from these companies.  You will be contacted with the lab results as soon as they are available. The fastest way to get your results is to activate your My Chart account. Instructions are located on the last page of this paperwork. If you have not heard from [place] regarding the results in 2 weeks, please contact this office.

## 2016-02-02 ENCOUNTER — Ambulatory Visit (INDEPENDENT_AMBULATORY_CARE_PROVIDER_SITE_OTHER): Payer: PPO | Admitting: Sports Medicine

## 2016-02-02 ENCOUNTER — Encounter: Payer: Self-pay | Admitting: Sports Medicine

## 2016-02-02 VITALS — BP 202/76 | Ht 72.0 in | Wt 175.0 lb

## 2016-02-02 DIAGNOSIS — R2689 Other abnormalities of gait and mobility: Secondary | ICD-10-CM

## 2016-02-02 NOTE — Progress Notes (Signed)
   Subjective:    Patient ID: Chad Russell, male    DOB: 29-Jun-1926, 82 y.o.   MRN: [id number]  HPI chief complaint: "I'm here for orthotics"  Very pleasant 82 year old male comes in today at the request of Dr. Janeann Forehand for evaluation and consideration of possible custom orthotics. Patient has a history of balance issues and gait abnormality secondary to this. He has been diagnosed with idiopathic peripheral neuropathy but, per his report and his wife's report, he still has really good feeling in his feet. He denies any pain. His main problem really is with balance. He feels like his current tennis shoes are "too loose" which leads to a problem with proprioception. He has had custom orthotics in the past. These were fitted in a pair of ski boots and he feels like a custom orthotic for his tennis shoes would be helpful. He is here today with his wife.  Past medical history is reviewed His medications are reviewed Allergies reviewed    Review of Systems    as above Objective:   Physical Exam  Well-developed, well-nourished. No acute distress. Awake alert and oriented 3.  Examination of both feet show a well-preserved arch. No significant callus buildup. No skin breakdown. There is a slight dusky appearance to both feet with slightly decreased posterior tibial and dorsalis pedis pulses. Sensation is intact to light touch.  Patient ambulates with a slow gait and is a bit unsteady on his feet.    Assessment & Plan:   Idiopathic peripheral neuropathy  Custom orthotics were constructed for the patient today. I do think they will help him with his sense of proprioception. He found them to be comfortable prior to leaving the office. Of note, the patient was unable to afford physical therapy at [facility]. I think physical therapy is very important for him so we have referred him to [facility]. As long as his orthotics are comfortable, he  will follow-up with [place] as needed.  Total time spent with the patient was 40 minutes with greater than 50% of the time spent in face-to-face consultation discussing orthotic construction, instruction, and fitting.  Patient was fitted for a : standard, cushioned, semi-rigid orthotic. The orthotic was heated and afterward the patient stood on the orthotic blank positioned on the orthotic stand. The patient was positioned in subtalar neutral position and 10 degrees of ankle dorsiflexion in a weight bearing stance. After completion of molding, a stable base was applied to the orthotic blank. The blank was ground to a stable position for weight bearing. Size: 13 Base: Blue EVA Posting: none Additional orthotic padding: none

## 2016-02-19 DIAGNOSIS — H353221 Exudative age-related macular degeneration, left eye, with active choroidal neovascularization: Secondary | ICD-10-CM | POA: Diagnosis not present

## 2016-03-30 DIAGNOSIS — H2513 Age-related nuclear cataract, bilateral: Secondary | ICD-10-CM | POA: Diagnosis not present

## 2016-03-30 DIAGNOSIS — H353221 Exudative age-related macular degeneration, left eye, with active choroidal neovascularization: Secondary | ICD-10-CM | POA: Diagnosis not present

## 2016-03-30 DIAGNOSIS — H353211 Exudative age-related macular degeneration, right eye, with active choroidal neovascularization: Secondary | ICD-10-CM | POA: Diagnosis not present

## 2016-03-30 DIAGNOSIS — H353133 Nonexudative age-related macular degeneration, bilateral, advanced atrophic without subfoveal involvement: Secondary | ICD-10-CM | POA: Diagnosis not present

## 2016-03-30 DIAGNOSIS — H3562 Retinal hemorrhage, left eye: Secondary | ICD-10-CM | POA: Diagnosis not present

## 2016-04-02 DIAGNOSIS — H353211 Exudative age-related macular degeneration, right eye, with active choroidal neovascularization: Secondary | ICD-10-CM | POA: Diagnosis not present

## 2016-04-17 ENCOUNTER — Other Ambulatory Visit: Payer: Self-pay | Admitting: Cardiology

## 2016-04-19 NOTE — Telephone Encounter (Signed)
Rx(s) sent to pharmacy electronically.  

## 2016-04-30 ENCOUNTER — Ambulatory Visit: Payer: Self-pay | Admitting: Neurology

## 2016-05-05 DIAGNOSIS — H353221 Exudative age-related macular degeneration, left eye, with active choroidal neovascularization: Secondary | ICD-10-CM | POA: Diagnosis not present

## 2016-05-10 DIAGNOSIS — H353211 Exudative age-related macular degeneration, right eye, with active choroidal neovascularization: Secondary | ICD-10-CM | POA: Diagnosis not present

## 2016-05-12 ENCOUNTER — Ambulatory Visit: Payer: Self-pay

## 2016-05-13 ENCOUNTER — Emergency Department (HOSPITAL_COMMUNITY)
Admission: EM | Admit: 2016-05-13 | Discharge: 2016-05-13 | Disposition: A | Discharge status: Home / Self Care | Payer: PPO | Attending: Emergency Medicine | Admitting: Emergency Medicine

## 2016-05-13 ENCOUNTER — Ambulatory Visit (INDEPENDENT_AMBULATORY_CARE_PROVIDER_SITE_OTHER): Payer: PPO | Admitting: Emergency Medicine

## 2016-05-13 VITALS — BP 127/79 | HR 44 | Temp 97.7°F | Resp 18 | Ht 72.0 in | Wt 178.8 lb

## 2016-05-13 DIAGNOSIS — R110 Nausea: Secondary | ICD-10-CM | POA: Diagnosis not present

## 2016-05-13 DIAGNOSIS — R300 Dysuria: Secondary | ICD-10-CM | POA: Diagnosis not present

## 2016-05-13 DIAGNOSIS — R102 Pelvic and perineal pain: Secondary | ICD-10-CM | POA: Diagnosis not present

## 2016-05-13 DIAGNOSIS — Z7901 Long term (current) use of anticoagulants: Secondary | ICD-10-CM | POA: Insufficient documentation

## 2016-05-13 DIAGNOSIS — R339 Retention of urine, unspecified: Secondary | ICD-10-CM

## 2016-05-13 DIAGNOSIS — Z79899 Other long term (current) drug therapy: Secondary | ICD-10-CM | POA: Diagnosis not present

## 2016-05-13 DIAGNOSIS — I10 Essential (primary) hypertension: Secondary | ICD-10-CM | POA: Diagnosis not present

## 2016-05-13 DIAGNOSIS — R310 Gross hematuria: Secondary | ICD-10-CM | POA: Diagnosis not present

## 2016-05-13 DIAGNOSIS — Z87891 Personal history of nicotine dependence: Secondary | ICD-10-CM | POA: Insufficient documentation

## 2016-05-13 DIAGNOSIS — R197 Diarrhea, unspecified: Secondary | ICD-10-CM

## 2016-05-13 DIAGNOSIS — I2510 Atherosclerotic heart disease of native coronary artery without angina pectoris: Secondary | ICD-10-CM | POA: Insufficient documentation

## 2016-05-13 DIAGNOSIS — I252 Old myocardial infarction: Secondary | ICD-10-CM | POA: Diagnosis not present

## 2016-05-13 LAB — URINALYSIS, ROUTINE W REFLEX MICROSCOPIC
BACTERIA UA: NONE SEEN
Bilirubin Urine: NEGATIVE
Glucose, UA: NEGATIVE mg/dL
Ketones, ur: NEGATIVE mg/dL
Leukocytes, UA: NEGATIVE
Nitrite: NEGATIVE
PROTEIN: NEGATIVE mg/dL
Specific Gravity, Urine: 1.011 (ref 1.005–1.030)
Squamous Epithelial / LPF: NONE SEEN
WBC UA: NONE SEEN WBC/hpf (ref 0–5)
pH: 5 (ref 5.0–8.0)

## 2016-05-13 NOTE — Addendum Note (Signed)
Addended by: Gari Crown D on: 05/13/2016 02:30 PM   Modules accepted: Orders

## 2016-05-13 NOTE — Progress Notes (Signed)
Chad Russell 83 y.o.   Chief Complaint  Patient presents with  . Urinary Tract Infection    x 2 days   . Diarrhea    x 2 days     HISTORY OF PRESENT ILLNESS: This is a 83 y.o. male complaining of urinary discomfort and burning; has h/o BPH and also sepsis from UTI in the past; had diarrhea yesterday; nauseous today with lower abdominal pain.Denies fever, chills, rectal bleeding. Thinks he's starting to develop UTI.  HPI   Prior to Admission medications   Medication Sig Start Date End Date Taking? Authorizing Provider  Docusate Sodium (COLACE PO) Take by mouth.   Yes Historical Provider, MD  lisinopril-hydrochlorothiazide (PRINZIDE,ZESTORETIC) 10-12.5 MG tablet TAKE 1 TABLET BY MOUTH DAILY. 04/19/16  Yes Minus Breeding, MD  Misc Natural Products (DAILY HERBS PROSTATE PO) Take 1 tablet by mouth daily.   Yes Historical Provider, MD  Multiple Vitamins-Minerals (MULTIVITAMIN PO) Take 1 tablet by mouth daily.   Yes Historical Provider, MD  Probiotic Product (PROBIOTIC DAILY PO) Take 1 tablet by mouth daily.   Yes Historical Provider, MD  rivaroxaban (XARELTO) 20 MG TABS tablet Take 1 tablet (20 mg total) by mouth daily with supper. 11/06/15  Yes Minus Breeding, MD  tamsulosin (FLOMAX) 0.4 MG CAPS capsule TAKE 1/2 CAPSULE (0.2 MG TOTAL) BY MOUTH DAILY. 12/11/15  Yes [name], MD  timolol (TIMOPTIC) 0.5 % ophthalmic solution  12/04/15  Yes Historical Provider, MD    Allergies  Allergen Reactions  . Doxycycline     Upset stomach  . Keflex [Cephalexin]     Abdominal disruption    Patient Active Problem List   Diagnosis Date Noted  . Balance problem 10/31/2013  . OSA (obstructive sleep apnea) 10/31/2013  . BPH (benign prostatic hyperplasia) 08/28/2012  . Atrial fibrillation ([place]) 06/08/2012  . Bacteremia 04/28/2012  . Hyperplasia of prostate with lower urinary tract symptoms (LUTS) 04/28/2012  . Calf pain 04/28/2012  . Moderate malnutrition ([place]) 04/28/2012  .  Influenza-like illness 04/27/2012  . Infection of urinary tract - recurrent 04/26/2012  . NSTEMI (non-ST elevated myocardial infarction) ([place]) 03/15/2011  . Complete heart block ([place]) 03/15/2011  . HTN (hypertension) 03/15/2011    Past Medical History:  Diagnosis Date  . Arthritis   . Atrial fibrillation ([place])   . Balance problem 10/31/2013  . BPH (benign prostatic hyperplasia) 2014   Urology Dr [name] with [facility] [phone]   . CAD (coronary artery disease)    LHC 03/16/11: Mid LAD 10-20%, proximal circumflex 10%, mid RCA 20%, EF 55%.  . Cataract   . Complete heart block ([place])   . GERD (gastroesophageal reflux disease)    takes occasional  zantac  . Hx of echocardiogram    Echo 02/2011: EF 65-70%, normal wall motion, MAC, mild MR, mild LAE, mild RVE  . Hypertension   . NSTEMI (non-ST elevated myocardial infarction) ([place])   . OSA (obstructive sleep apnea)   . Talar fracture    casted no surg    Past Surgical History:  Procedure Laterality Date  . COLONOSCOPY     Repeated and normal in 2011  . hemmorhoidectomy    . INGUINAL HERNIA REPAIR Right 02/26/2014   Procedure: LAPAROSCOPIC RIGHT INGUINAL HERNIA REPAIR;  Surgeon: Ralene Ok, MD;  Location: [facility];  Service: General;  Laterality: Right;  . INSERTION OF MESH Right 02/26/2014   Procedure: INSERTION OF MESH;  Surgeon: Ralene Ok, MD;  Location: [facility];  Service: General;  Laterality:  Right;  Marland Kitchen LEFT HEART CATHETERIZATION WITH CORONARY ANGIOGRAM N/A 03/16/2011   Procedure: LEFT HEART CATHETERIZATION WITH CORONARY ANGIOGRAM;  Surgeon: Peter M Martinique, MD;  Location: [facility] C[facility] LAB;  Service: Cardiovascular;  Laterality: N/A;  . PROSTATE BIOPSY     negative - [facility]    Social History   Social History  . Marital status: Married    Spouse name: Mardene Celeste  . Number of children: 0  . Years of education: 46   Occupational History  . 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 Smoking status:  Former Smoker    Packs/day: 1.00    Years: 3.00    Types: Cigarettes    Quit date: 03/17/1955  . Smokeless tobacco: Former Systems developer  . Alcohol use 4.2 oz/week    7 Standard drinks or equivalent per week     Comment: daily  . Drug use: No  . Sexual activity: No   Other Topics Concern  . 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  Has no children.      Family History  Problem Relation Age of Onset  . Hypertension Mother   . Multiple sclerosis Sister      Review of Systems  Constitutional: Negative for chills and fever.  HENT: Negative.   Eyes: Negative.   Respiratory: Negative for cough and shortness of breath.   Cardiovascular: Negative for chest pain and leg swelling.  Gastrointestinal: Positive for abdominal pain, diarrhea and nausea. Negative for blood in stool, melena and vomiting.  Genitourinary: Positive for dysuria. Negative for flank pain.  Musculoskeletal: Negative for back pain and neck pain.  Skin: Negative for rash.  Neurological: Positive for headaches. Negative for dizziness.  All other systems reviewed and are negative.  Vitals:   05/13/16 0946  BP: 127/79  Pulse: (!) 44  Resp: 18  Temp: 97.7 F (36.5 C)   Has h/o slow pulse.  Physical Exam  Constitutional: He is oriented to person, place, and time. He appears well-developed and well-nourished.  HENT:  Head: Normocephalic and atraumatic.  Nose: Nose normal.  Mouth/Throat: Oropharynx is clear and moist. No oropharyngeal exudate.  Eyes: Conjunctivae and EOM are normal. Pupils are equal, round, and reactive to light.  Neck: Normal range of motion. Neck supple. No JVD present. No thyromegaly present.  Cardiovascular: Normal rate, regular rhythm and normal heart sounds.   Pulmonary/Chest: Effort normal and breath sounds  normal.  Abdominal: Bowel sounds are normal. There is tenderness (suprapubic area).  Distended bladder.  Musculoskeletal: Normal range of motion.  Lymphadenopathy:    He has no cervical adenopathy.  Neurological: He is alert and oriented to person, place, and time. He displays normal reflexes. No sensory deficit. He exhibits normal muscle tone.  Skin: Skin is warm and dry. Capillary refill takes less than 2 seconds.  Psychiatric: He has a normal mood and affect. His behavior is normal.  Vitals reviewed.    ASSESSMENT & PLAN: To ED now; spoke to [place], ER charge nurse at [facility].   Traylon was seen today for urinary tract infection and diarrhea.  Diagnoses and all orders for this visit:  Dysuria -     Cancel: Urinalysis Dipstick -  Basic metabolic panel -     Cancel: POCT CBC -     POCT urinalysis dipstick -     Urine culture -     CBC with Differential/Platelet  Urinary retention  Nausea without vomiting  Pelvic pain  Diarrhea, unspecified type      Agustina Caroli, MD [facility]

## 2016-05-13 NOTE — Patient Instructions (Signed)
     IF you received an x-ray today, you will receive an invoice from [facility]. Please contact [facility] at [phone] with questions or concerns regarding your invoice.   IF you received labwork today, you will receive an invoice from [facility]. Please contact [facility] at [phone] with questions or concerns regarding your invoice.   Our billing staff will not be able to assist you with questions regarding bills from these companies.  You will be contacted with the lab results as soon as they are available. The fastest way to get your results is to activate your My Chart account. Instructions are located on the last page of this paperwork. If you have not heard from us regarding the results in 2 weeks, please contact this office.     

## 2016-05-13 NOTE — Discharge Instructions (Signed)
Follow-up with your urologist.  

## 2016-05-13 NOTE — ED Triage Notes (Signed)
Pt sent by PCP for possible urinary retention , pt reports dysuria and oliguria. Last void this AM. denies abd pain . No further symptoms.

## 2016-05-13 NOTE — ED Provider Notes (Signed)
[facility] DEPT Provider Note   CSN: [id number] Arrival date & time: 05/13/16  1221     History   Chief Complaint Chief Complaint  Patient presents with  . Urinary Retention    HPI Chad Russell is a 83 y.o. male.  83 yo M with a chief complaint of urinary retention. Patient has been having difficulty urinating for the past couple days. Started having some suprapubic pressure. Having some loose stools as well. Take Imodium for his diarrhea. Seen in urgent care with concern for a palpable bladder and sent to the ED. Patient denies fevers or chills denies difficulty eating or drinking.   The history is provided by the patient and the spouse.  Illness  This is a new problem. The current episode started 2 days ago. The problem occurs constantly. The problem has not changed since onset.Pertinent negatives include no chest pain, no abdominal pain, no headaches and no shortness of breath. Nothing aggravates the symptoms. Nothing relieves the symptoms. He has tried nothing for the symptoms. The treatment provided no relief.    Past Medical History:  Diagnosis Date  . Arthritis   . Atrial fibrillation ([place])   . Balance problem 10/31/2013  . BPH (benign prostatic hyperplasia) 2014   Urology Dr [name] with [facility] [phone]   . CAD (coronary artery disease)    LHC 03/16/11: Mid LAD 10-20%, proximal circumflex 10%, mid RCA 20%, EF 55%.  . Cataract   . Complete heart block ([place])   . GERD (gastroesophageal reflux disease)    takes occasional  zantac  . Hx of echocardiogram    Echo 02/2011: EF 65-70%, normal wall motion, MAC, mild MR, mild LAE, mild RVE  . Hypertension   . NSTEMI (non-ST elevated myocardial infarction) ([place])   . OSA (obstructive sleep apnea)   . Talar fracture    casted no surg    Patient Active Problem List   Diagnosis Date Noted  . Dysuria 05/13/2016  . Urinary retention 05/13/2016  . Nausea without vomiting 05/13/2016  . Pelvic pain 05/13/2016  . Balance  problem 10/31/2013  . OSA (obstructive sleep apnea) 10/31/2013  . BPH (benign prostatic hyperplasia) 08/28/2012  . Atrial fibrillation ([place]) 06/08/2012  . Bacteremia 04/28/2012  . Hyperplasia of prostate with lower urinary tract symptoms (LUTS) 04/28/2012  . Calf pain 04/28/2012  . Moderate malnutrition ([place]) 04/28/2012  . Influenza-like illness 04/27/2012  . Infection of urinary tract - recurrent 04/26/2012  . NSTEMI (non-ST elevated myocardial infarction) ([place]) 03/15/2011  . Complete heart block ([place]) 03/15/2011  . HTN (hypertension) 03/15/2011    Past Surgical History:  Procedure Laterality Date  . COLONOSCOPY     Repeated and normal in 2011  . hemmorhoidectomy    . INGUINAL HERNIA REPAIR Right 02/26/2014   Procedure: LAPAROSCOPIC RIGHT INGUINAL HERNIA REPAIR;  Surgeon: Ralene Ok, MD;  Location: [facility];  Service: General;  Laterality: Right;  . INSERTION OF MESH Right 02/26/2014   Procedure: INSERTION OF MESH;  Surgeon: Ralene Ok, MD;  Location: [facility];  Service: General;  Laterality: Right;  . LEFT HEART CATHETERIZATION WITH CORONARY ANGIOGRAM N/A 03/16/2011   Procedure: LEFT HEART CATHETERIZATION WITH CORONARY ANGIOGRAM;  Surgeon: Peter M Martinique, MD;  Location[facility] C[facility] LAB;  Service: Cardiovascular;  Laterality: N/A;  . PROSTATE BIOPSY     negative - [facility]       Home Medications    Prior to Admission medications   Medication Sig Start Date End Date Taking? Authorizing Provider  Docusate Sodium (COLACE PO) Take by mouth.   Yes Historical Provider, MD  lisinopril-hydrochlorothiazide (PRINZIDE,ZESTORETIC) 10-12.5 MG tablet TAKE 1 TABLET BY MOUTH DAILY. 04/19/16  Yes Minus Breeding, MD  Misc Natural Products (DAILY HERBS PROSTATE PO) Take 1 tablet by mouth daily.   Yes Historical Provider, MD  Multiple Vitamins-Minerals (MULTIVITAMIN PO) Take 1 tablet by mouth daily.   Yes Historical Provider, MD  multivitamin-lutein (OCUVITE-LUTEIN) CAPS capsule Take 1  capsule by mouth daily.   Yes Historical Provider, MD  Probiotic Product (PROBIOTIC DAILY PO) Take 1 tablet by mouth daily.   Yes Historical Provider, MD  rivaroxaban (XARELTO) 20 MG TABS tablet Take 1 tablet (20 mg total) by mouth daily with supper. 11/06/15  Yes Minus Breeding, MD  tamsulosin (FLOMAX) 0.4 MG CAPS capsule TAKE 1/2 CAPSULE (0.2 MG TOTAL) BY MOUTH DAILY. 12/11/15  Yes [name], MD  timolol (TIMOPTIC) 0.5 % ophthalmic solution Place 1 drop into the left eye daily.  12/04/15  Yes Historical Provider, MD  Turmeric (CURCUMIN 95) 500 MG CAPS Take 500 mg by mouth daily.   Yes Historical Provider, MD  vitamin C (ASCORBIC ACID) 500 MG tablet Take 500 mg by mouth daily.   Yes Historical Provider, MD    Family History Family History  Problem Relation Age of Onset  . Hypertension Mother   . Multiple sclerosis Sister     Social History Social History  Substance Use Topics  . Smoking status: Former Smoker    Packs/day: 1.00    Years: 3.00    Types: Cigarettes    Quit date: 03/17/1955  . Smokeless tobacco: Former Systems developer  . Alcohol use 4.2 oz/week    7 Standard drinks or equivalent per week     Comment: daily     Allergies   Doxycycline and Keflex [cephalexin]   Review of Systems Review of Systems  Constitutional: Negative for chills and fever.  HENT: Negative for congestion and facial swelling.   Eyes: Negative for discharge and visual disturbance.  Respiratory: Negative for shortness of breath.   Cardiovascular: Negative for chest pain and palpitations.  Gastrointestinal: Negative for abdominal pain, diarrhea and vomiting.  Genitourinary: Positive for difficulty urinating and dysuria.  Musculoskeletal: Negative for arthralgias and myalgias.  Skin: Negative for color change and rash.  Neurological: Negative for tremors, syncope and headaches.  Psychiatric/Behavioral: Negative for confusion and dysphoric mood.     Physical Exam Updated Vital Signs BP (!)  149/70   Pulse (!) 35   Temp 97.7 F (36.5 C)   SpO2 98%   Physical Exam  Constitutional: He is oriented to person, place, and time. He appears well-developed and well-nourished.  HENT:  Head: Normocephalic and atraumatic.  Eyes: EOM are normal. Pupils are equal, round, and reactive to light.  Neck: Normal range of motion. Neck supple. No JVD present.  Cardiovascular: Normal rate and regular rhythm.  Exam reveals no gallop and no friction rub.   No murmur heard. Pulmonary/Chest: No respiratory distress. He has no wheezes.  Abdominal: He exhibits distension (suprapubic). He exhibits no mass. There is no tenderness. There is no rebound and no guarding.  Musculoskeletal: Normal range of motion.  Neurological: He is alert and oriented to person, place, and time.  Skin: No rash noted. No pallor.  Psychiatric: He has a normal mood and affect. His behavior is normal.  Nursing note and vitals reviewed.    ED Treatments / Results  Labs (all labs ordered are listed, but only abnormal results  are displayed) Labs Reviewed  URINALYSIS, ROUTINE W REFLEX MICROSCOPIC - Abnormal; Notable for the following:       Result Value   Hgb urine dipstick MODERATE (*)    All other components within normal limits  URINE CULTURE    EKG  EKG Interpretation None       Radiology No results found.  Procedures Procedures (including critical care time)  Medications Ordered in ED Medications - No data to display   Initial Impression / Assessment and Plan / ED Course  I have reviewed the triage vital signs and the nursing notes.  Pertinent labs & imaging results that were available during my care of the patient were reviewed by me and considered in my medical decision making (see chart for details).     83 yo M With a chief complaint of suprapubic abdominal pain. Bladder scan with greater than a liter. We'll place a Foley and reassess.  Symptoms completely resolved with catheter placement. UA  without infection. Urology follow-up.  2:01 PM:  I have discussed the diagnosis/risks/treatment options with the patient and family and believe the pt to be eligible for discharge home to follow-up with Urology. We also discussed returning to the ED immediately if new or worsening sx occur. We discussed the sx which are most concerning (e.g., sudden worsening pain, fever, inability to tolerate by mouth) that necessitate immediate return. Medications administered to the patient during their visit and any new prescriptions provided to the patient are listed below.  Medications given during this visit Medications - No data to display   The patient appears reasonably screen and/or stabilized for discharge and I doubt any other medical condition or other [facility] requiring further screening, evaluation, or treatment in the ED at this time prior to discharge.    Final Clinical Impressions(s) / ED Diagnoses   Final diagnoses:  Urinary retention  Gross hematuria    New Prescriptions New Prescriptions   No medications on file     Deno Etienne, DO 05/13/16 1401

## 2016-05-14 ENCOUNTER — Encounter: Payer: Self-pay | Admitting: Neurology

## 2016-05-14 ENCOUNTER — Ambulatory Visit (INDEPENDENT_AMBULATORY_CARE_PROVIDER_SITE_OTHER): Payer: PPO | Admitting: Neurology

## 2016-05-14 ENCOUNTER — Other Ambulatory Visit: Payer: PPO

## 2016-05-14 VITALS — BP 138/80 | HR 39 | Ht 72.0 in | Wt 174.6 lb

## 2016-05-14 DIAGNOSIS — G609 Hereditary and idiopathic neuropathy, unspecified: Secondary | ICD-10-CM

## 2016-05-14 DIAGNOSIS — R531 Weakness: Secondary | ICD-10-CM

## 2016-05-14 DIAGNOSIS — R2689 Other abnormalities of gait and mobility: Secondary | ICD-10-CM

## 2016-05-14 LAB — CMP14+6AC+CBC/D/PLT
ALK PHOS: 51 IU/L (ref 39–117)
ALT: 14 IU/L (ref 0–44)
AST: 23 IU/L (ref 0–40)
Albumin/Globulin Ratio: 1.8 (ref 1.2–2.2)
Albumin: 4.2 g/dL (ref 3.5–4.7)
BASOS: 0 %
BILIRUBIN INDIRECT: 0.98 mg/dL — AB (ref 0.10–0.80)
BILIRUBIN TOTAL: 1.4 mg/dL — AB (ref 0.0–1.2)
BUN: 28 mg/dL — AB (ref 8–27)
Basophils Absolute: 0 10*3/uL (ref 0.0–0.2)
Bilirubin, Direct: 0.42 mg/dL — ABNORMAL HIGH (ref 0.00–0.40)
CHLORIDE: 98 mmol/L (ref 96–106)
CO2: 24 mmol/L (ref 18–29)
CREATININE: 1.8 mg/dL — AB (ref 0.76–1.27)
Calcium: 9.8 mg/dL (ref 8.6–10.2)
Cholesterol, Total: 132 mg/dL (ref 100–199)
EOS (ABSOLUTE): 0 10*3/uL (ref 0.0–0.4)
Eos: 0 %
GFR, EST AFRICAN AMERICAN: 38 mL/min/{1.73_m2} — AB (ref >59)
GFR, EST NON AFRICAN AMERICAN: 33 mL/min/{1.73_m2} — AB (ref >59)
GGT: 19 IU/L (ref 0–65)
Globulin, Total: 2.3 g/dL (ref 1.5–4.5)
Glucose: 122 mg/dL — ABNORMAL HIGH (ref 65–99)
HEMATOCRIT: 42.7 % (ref 37.5–51.0)
Hemoglobin: 14.5 g/dL (ref 13.0–17.7)
IMMATURE GRANS (ABS): 0 10*3/uL (ref 0.0–0.1)
Immature Granulocytes: 0 %
LDH: 217 IU/L (ref 121–224)
LYMPHS: 8 %
Lymphocytes Absolute: 1 10*3/uL (ref 0.7–3.1)
MCH: 32.6 pg (ref 26.6–33.0)
MCHC: 34 g/dL (ref 31.5–35.7)
MCV: 96 fL (ref 79–97)
MONOCYTES: 11 %
Monocytes Absolute: 1.4 10*3/uL — ABNORMAL HIGH (ref 0.1–0.9)
NEUTROS ABS: 9.7 10*3/uL — AB (ref 1.4–7.0)
Neutrophils: 81 %
PHOSPHORUS: 4.1 mg/dL (ref 2.5–4.5)
POTASSIUM: 4.3 mmol/L (ref 3.5–5.2)
Platelets: 223 10*3/uL (ref 150–379)
RBC: 4.45 x10E6/uL (ref 4.14–5.80)
RDW: 14.3 % (ref 12.3–15.4)
SODIUM: 140 mmol/L (ref 134–144)
TOTAL PROTEIN: 6.5 g/dL (ref 6.0–8.5)
URIC ACID: 5.6 mg/dL (ref 3.7–8.6)
WBC: 12.1 10*3/uL — AB (ref 3.4–10.8)

## 2016-05-14 LAB — URINE CULTURE

## 2016-05-14 LAB — BASIC METABOLIC PANEL: BUN / CREAT RATIO: 16 (ref 10–24)

## 2016-05-14 NOTE — Patient Instructions (Addendum)
1.  Check SPEP with [facility] 2.  Referral to [facility] for [facility] training  Return to clinic in 6 months, or sooner as needed

## 2016-05-14 NOTE — Progress Notes (Signed)
Follow-up Visit   Date: 05/14/16    Chad Russell MRN: [id number] DOB: 1927-01-06   Interim History: Chad Russell is a 83 y.o. right-handed Caucasian male with atrial fibrillation on anticoagulation therapy, hypertension, and BPH returning to the clinic for follow-up of neuropathy causing gait imbalance.  The patient was accompanied to the clinic by self.  History of present illness: Starting around 2010, he began having unsteadiness when walking.  He recalls that it started suddenly after working in the yard and developing a rash to poison ivy, but he also noticed two distinct areas, which he thought were spider bites. Within a few hours, he developed visual distortion as if objects were moving slowly.  This visual disturbance improved, but since this time, he feels that his balance has always been off.  He walks independently and has not suffered any falls.  Imbalance is worse in poor lit settings, such as at night and he has placed night lights to assist him, if he needs to get up at night time use the bathroom.  He denies any numbness/tingling of the legs or low back pain.  His wife has also noticed that he tends to walk heavy with his feet.    His wife feels that his voice has become softer, movements are slower, and penmanship has become poor.  Sense of smell is good.   He drinks one glass of wine nightly. 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    UPDATE 05/14/2016:  He is here for 4 month follow-up appointment. He reports imbalance being stable without significant worsening and continues to walk unassisted and has not suffered any falls.  He is interested in starting physical therapy.  He denies any new tremor or stiffness.  He went to the ER yesterday with urinary retention and now has a foley catheter and will be seeing urology as follow-up.  No new complaints.   Medications:  Current Outpatient  Prescriptions on File Prior to Visit  Medication Sig Dispense Refill  . Docusate Sodium (COLACE PO) Take by mouth.    Marland Kitchen lisinopril-hydrochlorothiazide (PRINZIDE,ZESTORETIC) 10-12.5 MG tablet TAKE 1 TABLET BY MOUTH DAILY. 90 tablet 1  . Misc Natural Products (DAILY HERBS PROSTATE PO) Take 1 tablet by mouth daily.    . Multiple Vitamins-Minerals (MULTIVITAMIN PO) Take 1 tablet by mouth daily.    . multivitamin-lutein (OCUVITE-LUTEIN) CAPS capsule Take 1 capsule by mouth daily.    . Probiotic Product (PROBIOTIC DAILY PO) Take 1 tablet by mouth daily.    . rivaroxaban (XARELTO) 20 MG TABS tablet Take 1 tablet (20 mg total) by mouth daily with supper. 90 tablet 1  . tamsulosin (FLOMAX) 0.4 MG CAPS capsule TAKE 1/2 CAPSULE (0.2 MG TOTAL) BY MOUTH DAILY. 90 capsule 1  . timolol (TIMOPTIC) 0.5 % ophthalmic solution Place 1 drop into the left eye daily.     . Turmeric (CURCUMIN 95) 500 MG CAPS Take 500 mg by mouth daily.    . vitamin C (ASCORBIC ACID) 500 MG tablet Take 500 mg by mouth daily.     No current facility-administered medications on file prior to visit.     Allergies:  Allergies  Allergen Reactions  . Doxycycline     Upset stomach  . Keflex [Cephalexin]     Abdominal disruption    Review of Systems:  CONSTITUTIONAL: No fevers, chills, night sweats, or weight loss.  EYES: No visual changes  or eye pain ENT: No hearing changes.  No history of nose bleeds.   RESPIRATORY: No cough, wheezing and shortness of breath.   CARDIOVASCULAR: Negative for chest pain, and palpitations.   GI: Negative for abdominal discomfort, blood in stools or black stools.  No recent change in bowel habits.   GU:  +history of retention.   MUSCLOSKELETAL: No history of joint pain or swelling.  No myalgias.   SKIN: Negative for lesions, rash, and itching.   ENDOCRINE: Negative for cold or heat intolerance, polydipsia or goiter.   PSYCH:  No depression or anxiety symptoms.   NEURO: As Above.   Vital Signs:    BP 138/80   Pulse (!) 39   Ht 6' (1.829 m)   Wt 174 lb 9 oz (79.2 kg)   SpO2 97%   BMI 23.67 kg/m   Neurological Exam: MENTAL STATUS including orientation to time, place, person, recent and remote memory, attention span and concentration, language, and fund of knowledge is normal.  Speech is not dysarthric, soft.  CRANIAL NERVES:  Pupils equal round and reactive to light.  Normal conjugate, extra-ocular eye movements in all directions of gaze. Subtle left ptosis.  Face is symmetric.   MOTOR:  Motor strength is 5/5 in all extremities, except toe extensors are 4/5.  Mild tremor with arms out-stretched.  No resting tremor.  Tone is normal.    SENSORY:  Vibration remains reduced to 70% at the knees, 30% at the ankles .  COORDINATION/GAIT:  Normal finger-to- nose-finger.  Intact rapid alternating movements bilaterally.  Able to rise from a chair without using arms.  He walks with short steps, turns with 3 steps.   Data: NCS/EMG of the legs 12/30/2015: The electrophysiologic findings are most consistent with a distal and symmetric sensorimotor polyneuropathy, axon loss in type, affecting the lower extremities.   Labs 12/29/2015:  SPEP with IFE IgG lambda monoclonal band, vitamin B1 60, vitamin B12 1209, copper  IMPRESSION/PLAN: 1.  Idiopathic peripheral neuropathy causing sensory ataxia and gait imbalance.  Overall, he is clinically stable without interval falls.  NCS/EMG confirmed chronic axonal sensorimotor neuropathy.  Labs showed  IgG lambda monoclonal band on serum only and this will be rechecked.  If he continues to have monoclonal protein, he will be referred to hematology to evaluate for MGUS vs other paraproteinmia.  He will also be started in gait training program.  Again, fall precautions were stressed, especially as he is on anticoagulation therapy for atrial fibrillation.  2.  Parkinsonism.  Exam continues to show blunted affect with short shuffling gait, but there is no rigidity or  tremor.  If there is no improvement with gait after PT, will offer a trial of sinemet.  Return to clinic in 6 months, or sooner as needed  The duration of this appointment visit was 20 minutes of face-to-face time with the patient.  Greater than 50% of this time was spent in counseling, explanation of diagnosis, planning of further management, and coordination of care.   Thank you for allowing me to participate in patient's care.  If I can answer any additional questions, I would be pleased to do so.    Sincerely,    Donika K. Posey Pronto, DO

## 2016-05-18 LAB — PROTEIN ELECTROPHORESIS, SERUM
ABNORMAL PROTEIN BAND1: 0.2 g/dL
Albumin ELP: 3.4 g/dL — ABNORMAL LOW (ref 3.8–4.8)
Alpha-1-Globulin: 0.3 g/dL (ref 0.2–0.3)
Alpha-2-Globulin: 0.5 g/dL (ref 0.5–0.9)
BETA 2: 0.2 g/dL (ref 0.2–0.5)
Beta Globulin: 0.3 g/dL — ABNORMAL LOW (ref 0.4–0.6)
Gamma Globulin: 0.9 g/dL (ref 0.8–1.7)
Total Protein, Serum Electrophoresis: 5.6 g/dL — ABNORMAL LOW (ref 6.1–8.1)

## 2016-05-18 LAB — IMMUNOFIXATION ELECTROPHORESIS
IGM, SERUM: 41 mg/dL — AB (ref 48–271)
IgA: 184 mg/dL (ref 81–463)
IgG (Immunoglobin G), Serum: 1053 mg/dL (ref 694–1618)

## 2016-05-19 ENCOUNTER — Other Ambulatory Visit: Payer: Self-pay | Admitting: *Deleted

## 2016-05-19 ENCOUNTER — Telehealth: Payer: Self-pay | Admitting: *Deleted

## 2016-05-19 DIAGNOSIS — D472 Monoclonal gammopathy: Secondary | ICD-10-CM

## 2016-05-19 NOTE — Telephone Encounter (Signed)
-----   Message from Alda Berthold, DO sent at 05/18/2016  3:03 PM EDT ----- Please inform patient that I would like to refer him to hematology for evaluation, as we discussed in the office.  Please send referral for monoclonal gammopathy/paraproteinia and neuropathy. Thanks.

## 2016-05-19 NOTE — Telephone Encounter (Signed)
Referral sent 

## 2016-05-19 NOTE — Progress Notes (Unsigned)
amb  

## 2016-05-20 ENCOUNTER — Telehealth: Payer: Self-pay | Admitting: Cardiology

## 2016-05-20 DIAGNOSIS — N401 Enlarged prostate with lower urinary tract symptoms: Secondary | ICD-10-CM | POA: Diagnosis not present

## 2016-05-20 DIAGNOSIS — R338 Other retention of urine: Secondary | ICD-10-CM | POA: Diagnosis not present

## 2016-05-20 DIAGNOSIS — R310 Gross hematuria: Secondary | ICD-10-CM | POA: Diagnosis not present

## 2016-05-20 NOTE — Telephone Encounter (Signed)
New Message    Pt c/o medication issue:  1. Name of Medication: rivaroxaban (XARELTO) 20 MG TABS tablet  2. How are you currently taking this medication (dosage and times per day)? As Prescribed  3. Are you having a reaction (difficulty breathing--STAT)? No  4. What is your medication issue? Per pt has experienced some blood loss, and Dr. [name] suggested he come off medication for two days, and wanted to run it by Dr. Percival Spanish first

## 2016-05-20 NOTE — Telephone Encounter (Signed)
Spoke to patient information-given patient states he will hold medication for 4 days per dr  [name].  patient states he will call dr [name] in a days

## 2016-05-20 NOTE — Telephone Encounter (Signed)
Dr Percival Spanish recent note stated "CHA2DS2 - VASc score of 3 with a risk of stroke of 3.2%."  Consider low risk and okay to [place] as recommended by urologist while experiencing hematuria.

## 2016-05-20 NOTE — Telephone Encounter (Signed)
Returned the phone call to the patient. He stated that he had to go to [facility] a week ago for urinary retention. He had a foley catheter placed and since then has experienced bleeding from the site. He was discharged with the foley catheter. He stated that he has seen Dr. [name], urologist, and he advised him to speak to his cardiologist about holding his Xarelto "for a while."  He is currently on 20 mg Xarelto. Please advise.

## 2016-05-25 DIAGNOSIS — R310 Gross hematuria: Secondary | ICD-10-CM | POA: Diagnosis not present

## 2016-05-28 ENCOUNTER — Telehealth: Payer: Self-pay | Admitting: Cardiology

## 2016-05-28 DIAGNOSIS — R310 Gross hematuria: Secondary | ICD-10-CM | POA: Diagnosis not present

## 2016-05-28 NOTE — Telephone Encounter (Signed)
New message      Pt c/o medication issue:  1. Name of Medication: Xarelto 2. How are you currently taking this medication (dosage and times per day)?  20mg  daily  3. Are you having a reaction (difficulty breathing--STAT)? no 4. What is your medication issue?  Talk to a nurse about a bleeding issue while on xarelto

## 2016-05-28 NOTE — Telephone Encounter (Signed)
We need to HOLD all anticoagulation until bleeding resolved. Giving anticoagulation while patient experiencing significant bleeding may outweigh any potential benefit.   Please contact your urologist to determine the reason for the current bleeding (catheter should not be causing bleeding and xarelto will not cause bleeding by itself either).  Need to rule out traumatic placement, out of place or any other cause.  Patient should be off any tumeric, NSAIDs or irritant as well.

## 2016-05-28 NOTE — Telephone Encounter (Signed)
The patient stated that he spoke with his Urologist this morning and they wanted to know what Cardiology thought. He is tentatively getting the foley catheter removed on 4/26. He states that he only bleeds when he takes the Xarelto. He will hold the Xarelto until further notice from Dr. Percival Spanish.

## 2016-05-28 NOTE — Telephone Encounter (Signed)
Spoke with the patient. He had been hospitalized on 3/22 for urinary retention. He had a permanent foley catheter placed. He had been experiencing bleeding at the site. He called [facility] on 3/29 and was advised to hold Xarelto (20mg ) as recommended by his Urologist. He states that he took [place] on Sunday and experienced bleeding. Held it again until yesterday (4/5) where he experienced bleeding again. He wants to know how long he can safely hold the xarelto.

## 2016-05-30 NOTE — Telephone Encounter (Signed)
I agree with the note from our PharmD.  He does have a risk of stroke while of of Xarelto but if he has significant bleeding and nothing is wrong with the Foley or placement of this and it is going to continue bleeding with anticoagulation then he should not restart the Xarelto until the Foley is out.

## 2016-05-31 NOTE — Telephone Encounter (Signed)
Returned call, and have advised patient per MD instruction not to resume xarelto until foley removed. He wanted to update Dr. Percival Spanish and inform that he's not had any signs of bleeding for 3 days. As noted before, he gets catheter out at the end of the month (4/26). Informed him I will see with resolution of bleeding if Dr. Percival Spanish OK's resumption of Xarelto earlier.

## 2016-05-31 NOTE — Telephone Encounter (Signed)
Follow up    Pt is calling to follow up about his xarelto

## 2016-06-01 NOTE — Telephone Encounter (Signed)
Left msg for patient to call. 

## 2016-06-01 NOTE — Telephone Encounter (Signed)
He can try to resume it again.  However, if he rebleeds he needs to stay off until he has the Foley out.

## 2016-06-02 ENCOUNTER — Ambulatory Visit (INDEPENDENT_AMBULATORY_CARE_PROVIDER_SITE_OTHER): Payer: PPO | Admitting: Emergency Medicine

## 2016-06-02 VITALS — BP 152/73 | HR 34 | Temp 97.4°F | Ht 72.0 in | Wt 169.2 lb

## 2016-06-02 DIAGNOSIS — K5900 Constipation, unspecified: Secondary | ICD-10-CM | POA: Diagnosis not present

## 2016-06-02 MED ORDER — POLYETHYLENE GLYCOL 3350 17 GM/SCOOP PO POWD: 17.0000 g | Freq: Two times a day (BID) | ORAL | 1 refills | Status: DC | PRN

## 2016-06-02 NOTE — Patient Instructions (Addendum)
Continue Docusate(colace).   Constipation, Adult Constipation is when a person:  Poops (has a bowel movement) fewer times in a week than normal.  Has a hard time pooping.  Has poop that is dry, hard, or bigger than normal. Follow these instructions at home: Eating and drinking    Eat foods that have a lot of fiber, such as:  Fresh fruits and vegetables.  Whole grains.  Beans.  Eat less of foods that are high in fat, low in fiber, or overly processed, such as:  [place] fries.  Hamburgers.  Cookies.  Candy.  Soda.  Drink enough fluid to keep your pee (urine) clear or pale yellow. General instructions   Exercise regularly or as told by your doctor.  Go to the restroom when you feel like you need to poop. Do not hold it in.  Take over-the-counter and prescription medicines only as told by your doctor. These include any fiber supplements.  Do pelvic floor retraining exercises, such as:  Doing deep breathing while relaxing your lower belly (abdomen).  Relaxing your pelvic floor while pooping.  Watch your condition for any changes.  Keep all follow-up visits as told by your doctor. This is important. Contact a doctor if:  You have pain that gets worse.  You have a fever.  You have not pooped for 4 days.  You throw up (vomit).  You are not hungry.  You lose weight.  You are bleeding from the anus.  You have thin, pencil-like poop (stool). Get help right away if:  You have a fever, and your symptoms suddenly get worse.  You leak poop or have blood in your poop.  Your belly feels hard or bigger than normal (is bloated).  You have very bad belly pain.  You feel dizzy or you faint. This information is not intended to replace advice given to you by your health care provider. Make sure you discuss any questions you have with your health care provider. Document Released: 07/28/2007 Document Revised: 08/29/2015 Document Reviewed: 07/30/2015 Elsevier  Interactive Patient Education  2017 [employer].   IF you received an x-ray today, you will receive an invoice from [facility]. Please contact [facility] at [phone] with questions or concerns regarding your invoice.   IF you received labwork today, you will receive an invoice from [facility]. Please contact [facility] at [phone] with questions or concerns regarding your invoice.   Our billing staff will not be able to assist you with questions regarding bills from these companies.  You will be contacted with the lab results as soon as they are available. The fastest way to get your results is to activate your My Chart account. Instructions are located on the last page of this paperwork. If you have not heard from [place] regarding the results in 2 weeks, please contact this office.

## 2016-06-02 NOTE — Progress Notes (Signed)
Chad Russell 83 y.o.   Chief Complaint  Patient presents with  . Constipation    X 4 days    HISTORY OF PRESENT ILLNESS: This is a 83 y.o. male complaining of constipation for several days. Recently seen by me and sent to ER for acute urinary retention and gross hematuria; urine culture grew multiple organisms; started on Bactrim DS. Denies abdominal pain.  Constipation  This is a new problem. The current episode started in the past 7 days. The problem has been waxing and waning since onset. The patient is not on a high fiber diet. He does not exercise regularly. There has not been adequate water intake. Pertinent negatives include no abdominal pain, back pain, diarrhea, difficulty urinating, fever, hematochezia, melena, nausea, rectal pain or vomiting.     Prior to Admission medications   Medication Sig Start Date End Date Taking? Authorizing Provider  Docusate Sodium (COLACE PO) Take by mouth.   Yes Historical Provider, MD  lisinopril-hydrochlorothiazide (PRINZIDE,ZESTORETIC) 10-12.5 MG tablet TAKE 1 TABLET BY MOUTH DAILY. 04/19/16  Yes Minus Breeding, MD  Misc Natural Products (DAILY HERBS PROSTATE PO) Take 1 tablet by mouth daily.   Yes Historical Provider, MD  Multiple Vitamins-Minerals (MULTIVITAMIN PO) Take 1 tablet by mouth daily.   Yes Historical Provider, MD  multivitamin-lutein (OCUVITE-LUTEIN) CAPS capsule Take 1 capsule by mouth daily.   Yes Historical Provider, MD  Probiotic Product (PROBIOTIC DAILY PO) Take 1 tablet by mouth daily.   Yes Historical Provider, MD  rivaroxaban (XARELTO) 20 MG TABS tablet Take 1 tablet (20 mg total) by mouth daily with supper. 11/06/15  Yes Minus Breeding, MD  tamsulosin (FLOMAX) 0.4 MG CAPS capsule TAKE 1/2 CAPSULE (0.2 MG TOTAL) BY MOUTH DAILY. 12/11/15  Yes [name], MD  timolol (TIMOPTIC) 0.5 % ophthalmic solution Place 1 drop into the left eye daily.  12/04/15  Yes Historical Provider, MD  Turmeric (CURCUMIN 95) 500 MG CAPS  Take 500 mg by mouth daily.   Yes Historical Provider, MD  vitamin C (ASCORBIC ACID) 500 MG tablet Take 500 mg by mouth daily.   Yes Historical Provider, MD    Allergies  Allergen Reactions  . Doxycycline     Upset stomach  . Keflex [Cephalexin]     Abdominal disruption    Patient Active Problem List   Diagnosis Date Noted  . Dysuria 05/13/2016  . Urinary retention 05/13/2016  . Nausea without vomiting 05/13/2016  . Pelvic pain 05/13/2016  . Balance problem 10/31/2013  . OSA (obstructive sleep apnea) 10/31/2013  . BPH (benign prostatic hyperplasia) 08/28/2012  . Atrial fibrillation ([place]) 06/08/2012  . Bacteremia 04/28/2012  . Hyperplasia of prostate with lower urinary tract symptoms (LUTS) 04/28/2012  . Calf pain 04/28/2012  . Moderate malnutrition ([place]) 04/28/2012  . Influenza-like illness 04/27/2012  . Infection of urinary tract - recurrent 04/26/2012  . NSTEMI (non-ST elevated myocardial infarction) ([place]) 03/15/2011  . Complete heart block ([place]) 03/15/2011  . HTN (hypertension) 03/15/2011    Past Medical History:  Diagnosis Date  . Arthritis   . Atrial fibrillation ([place])   . Balance problem 10/31/2013  . BPH (benign prostatic hyperplasia) 2014   [facility]rology Dr [name] with [facility] [phone]   . CAD (coronary artery disease)    LHC 03/16/11: Mid LAD 10-20%, proximal circumflex 10%, mid RCA 20%, EF 55%.  . Cataract   . Complete heart block ([place])   . GERD (gastroesophageal reflux disease)    takes occasional  zantac  .  Hx of echocardiogram    Echo 02/2011: EF 65-70%, normal wall motion, MAC, mild MR, mild LAE, mild RVE  . Hypertension   . NSTEMI (non-ST elevated myocardial infarction) ([place])   . OSA (obstructive sleep apnea)   . Talar fracture    casted no surg    Past Surgical History:  Procedure Laterality Date  . COLONOSCOPY     Repeated and normal in 2011  . hemmorhoidectomy    . INGUINAL HERNIA REPAIR Right 02/26/2014   Procedure: LAPAROSCOPIC RIGHT INGUINAL  HERNIA REPAIR;  Surgeon: Ralene Ok, MD;  Location: [place];  Service: General;  Laterality: Right;  . INSERTION OF MESH Right 02/26/2014   Procedure: INSERTION OF MESH;  Surgeon: Ralene Ok, MD;  Location: [place];  Service: General;  Laterality: Right;  . LEFT HEART CATHETERIZATION WITH CORONARY ANGIOGRAM N/A 03/16/2011   Procedure: LEFT HEART CATHETERIZATION WITH CORONARY ANGIOGRAM;  Surgeon: Peter M Martinique, MD;  Location: [facility] CATH LAB;  Service: Cardiovascular;  Laterality: N/A;  . PROSTATE BIOPSY     negative - [facility]    Social History   Social History  . Marital status: Married    Spouse name: Chad Russell  . Number of children: 0  . Years of education: 47   Occupational History  . 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 Smoking status: Former Smoker    Packs/day: 1.00    Years: 3.00    Types: Cigarettes    Quit date: 03/17/1955  . Smokeless tobacco: Former Systems developer  . Alcohol use 4.2 oz/week    7 Standard drinks or equivalent per week     Comment: daily  . Drug use: No  . Sexual activity: No   Other Topics Concern  . 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  Has no children.      Family History  Problem Relation Age of Onset  . Hypertension Mother   . Multiple sclerosis Sister      Review of Systems  Constitutional: Negative for chills and fever.  HENT: Negative for congestion, nosebleeds, sinus pain and sore throat.   Eyes: Negative for discharge and redness.  Respiratory: Negative for cough and shortness of breath.   Cardiovascular: Negative for chest pain, palpitations and leg swelling.  Gastrointestinal: Positive for constipation. Negative for abdominal pain, diarrhea, hematochezia, melena, nausea, rectal pain and vomiting.    Genitourinary: Negative for difficulty urinating.       Urinary retention  Musculoskeletal: Negative for back pain.  Skin: Negative for rash.  Neurological: Negative for dizziness, sensory change, focal weakness and headaches.  All other systems reviewed and are negative.  Vitals:   06/02/16 1430  BP: (!) 152/73  Pulse: (!) 34  Temp: 97.4 F (36.3 C)     Physical Exam  Constitutional: He is oriented to person, place, and time. He appears well-developed and well-nourished.  HENT:  Head: Normocephalic and atraumatic.  Nose: Nose normal.  Mouth/Throat: Oropharynx is clear and moist. No oropharyngeal exudate.  Eyes: Conjunctivae and EOM are normal. Pupils are equal, round, and reactive to light.  Neck: Normal range of motion. Neck supple. No JVD present.  Cardiovascular: Regular rhythm and normal heart sounds.  Bradycardia present.  Chronic bradycardia  Pulmonary/Chest: Effort normal and breath sounds normal.  Abdominal: Soft. Bowel sounds are normal. He exhibits no distension. There is no tenderness.  Genitourinary:  Genitourinary Comments: Foley catheter in place.  Musculoskeletal: Normal range of motion.  Lymphadenopathy:    He has no cervical adenopathy.  Neurological: He is alert and oriented to person, place, and time. No sensory deficit. He exhibits normal muscle tone.  Skin: Skin is warm and dry. Capillary refill takes less than 2 seconds.  Psychiatric: He has a normal mood and affect.  Vitals reviewed.    ASSESSMENT & PLAN: Loyal was seen today for constipation.  Diagnoses and all orders for this visit:  Constipation, unspecified constipation type  Other orders -     polyethylene glycol powder (GLYCOLAX/MIRALAX) powder; Take 17 g by mouth 2 (two) times daily as needed.    Patient Instructions   Continue Docusate(colace).   Constipation, Adult Constipation is when a person:  Poops (has a bowel movement) fewer times in a week than normal.  Has a  hard time pooping.  Has poop that is dry, hard, or bigger than normal. Follow these instructions at home: Eating and drinking    Eat foods that have a lot of fiber, such as:  Fresh fruits and vegetables.  Whole grains.  Beans.  Eat less of foods that are high in fat, low in fiber, or overly processed, such as:  [place] fries.  Hamburgers.  Cookies.  Candy.  Soda.  Drink enough fluid to keep your pee (urine) clear or pale yellow. General instructions   Exercise regularly or as told by your doctor.  Go to the restroom when you feel like you need to poop. Do not hold it in.  Take over-the-counter and prescription medicines only as told by your doctor. These include any fiber supplements.  Do pelvic floor retraining exercises, such as:  Doing deep breathing while relaxing your lower belly (abdomen).  Relaxing your pelvic floor while pooping.  Watch your condition for any changes.  Keep all follow-up visits as told by your doctor. This is important. Contact a doctor if:  You have pain that gets worse.  You have a fever.  You have not pooped for 4 days.  You throw up (vomit).  You are not hungry.  You lose weight.  You are bleeding from the anus.  You have thin, pencil-like poop (stool). Get help right away if:  You have a fever, and your symptoms suddenly get worse.  You leak poop or have blood in your poop.  Your belly feels hard or bigger than normal (is bloated).  You have very bad belly pain.  You feel dizzy or you faint. This information is not intended to replace advice given to you by your health care provider. Make sure you discuss any questions you have with your health care provider. Document Released: 07/28/2007 Document Revised: 08/29/2015 Document Reviewed: 07/30/2015 Elsevier Interactive Patient Education  2017 [employer].   IF you received an x-ray today, you will receive an invoice from [facility]. Please contact  [facility] at [phone] with questions or concerns regarding your invoice.   IF you received labwork today, you will receive an invoice from [facility]. Please contact [facility] at [phone] with questions or concerns regarding your invoice.   Our billing staff will not be able to assist you with questions regarding bills from these companies.  You will be contacted with the lab results as soon as they are available. The fastest  way to get your results is to activate your My Chart account. Instructions are located on the last page of this paperwork. If you have not heard from [place] regarding the results in 2 weeks, please contact this office.          Agustina Caroli, MD [facility]

## 2016-06-04 NOTE — Telephone Encounter (Signed)
Left message to call back  

## 2016-06-07 NOTE — Telephone Encounter (Signed)
Spoke with pt, he is aware of dr hochrein's recommendations.

## 2016-06-08 ENCOUNTER — Encounter: Payer: Self-pay | Admitting: Family Medicine

## 2016-06-08 ENCOUNTER — Ambulatory Visit (INDEPENDENT_AMBULATORY_CARE_PROVIDER_SITE_OTHER): Payer: PPO | Admitting: Family Medicine

## 2016-06-08 VITALS — BP 166/73 | HR 37 | Temp 98.4°F | Resp 17 | Ht 71.0 in | Wt 176.0 lb

## 2016-06-08 DIAGNOSIS — K5900 Constipation, unspecified: Secondary | ICD-10-CM

## 2016-06-08 NOTE — Progress Notes (Addendum)
Subjective:  By signing my name below, I, Chad Russell, attest that this documentation has been prepared under the direction and in the presence of Chad Cheadle, MD  Electronically Signed: Ladene Artist, ED Scribe 06/08/2016 at 2:50 PM.   Patient ID: Chad Russell, male    DOB: 1927-02-01, 83 y.o.   MRN: [id number]  Chief Complaint  Patient presents with  . Constipation   HPI Chad Russell is a 83 y.o. male who presents to Primary Care at [facility] complaining of constipation. H/o multiple medical problems including complete heart block followed by Dr. Percival Spanish. Was admitted 3/22 with urinary retention. Sent to urology outpatient for follow-up.   Pt saw Dr. Mitchel Honour on 4/11 for constipation, was prescribed Miralax twice daily and advised to continue colace. Pt states he didn't read the prescription and only took Miralax once daily as the bottle advised and has increased his fruit intake. He has not tried any enemas. Pt's last BM was 4 days ago; states it was small and similar to pebbles. He denies abdominal pain.  Patient Active Problem List   Diagnosis Date Noted  . Dysuria 05/13/2016  . Urinary retention 05/13/2016  . Nausea without vomiting 05/13/2016  . Pelvic pain 05/13/2016  . Balance problem 10/31/2013  . OSA (obstructive sleep apnea) 10/31/2013  . BPH (benign prostatic hyperplasia) 08/28/2012  . Atrial fibrillation ([place]) 06/08/2012  . Bacteremia 04/28/2012  . Hyperplasia of prostate with lower urinary tract symptoms (LUTS) 04/28/2012  . Calf pain 04/28/2012  . Moderate malnutrition ([place]) 04/28/2012  . Influenza-like illness 04/27/2012  . Infection of urinary tract - recurrent 04/26/2012  . NSTEMI (non-ST elevated myocardial infarction) ([place]) 03/15/2011  . Complete heart block ([place]) 03/15/2011  . HTN (hypertension) 03/15/2011   Past Medical History:  Diagnosis Date  . Arthritis   . Atrial fibrillation ([place])   . Balance problem 10/31/2013  . BPH (benign prostatic  hyperplasia) 2014   Urology Dr [name] with [facility] [phone]   . CAD (coronary artery disease)    LHC 03/16/11: Mid LAD 10-20%, proximal circumflex 10%, mid RCA 20%, EF 55%.  . Cataract   . Complete heart block ([place])   . GERD (gastroesophageal reflux disease)    takes occasional  zantac  . Hx of echocardiogram    Echo 02/2011: EF 65-70%, normal wall motion, MAC, mild MR, mild LAE, mild RVE  . Hypertension   . NSTEMI (non-ST elevated myocardial infarction) ([place])   . OSA (obstructive sleep apnea)   . Talar fracture    casted no surg   Past Surgical History:  Procedure Laterality Date  . COLONOSCOPY     Repeated and normal in 2011  . hemmorhoidectomy    . INGUINAL HERNIA REPAIR Right 02/26/2014   Procedure: LAPAROSCOPIC RIGHT INGUINAL HERNIA REPAIR;  Surgeon: Ralene Ok, MD;  Location: [place];  Service: General;  Laterality: Right;  . INSERTION OF MESH Right 02/26/2014   Procedure: INSERTION OF MESH;  Surgeon: Ralene Ok, MD;  Location: [place];  Service: General;  Laterality: Right;  . LEFT HEART CATHETERIZATION WITH CORONARY ANGIOGRAM N/A 03/16/2011   Procedure: LEFT HEART CATHETERIZATION WITH CORONARY ANGIOGRAM;  Surgeon: Peter M Martinique, MD;  Location: [facility] CATH LAB;  Service: Cardiovascular;  Laterality: N/A;  . PROSTATE BIOPSY     negative - [facility]   Allergies  Allergen Reactions  . Doxycycline     Upset stomach  . Keflex [Cephalexin]     Abdominal disruption   Prior to Admission  medications   Medication Sig Start Date End Date Taking? Authorizing Provider  Docusate Sodium (COLACE PO) Take by mouth.   Yes Historical Provider, MD  lisinopril-hydrochlorothiazide (PRINZIDE,ZESTORETIC) 10-12.5 MG tablet TAKE 1 TABLET BY MOUTH DAILY. 04/19/16  Yes Minus Breeding, MD  Misc Natural Products (DAILY HERBS PROSTATE PO) Take 1 tablet by mouth daily.   Yes Historical Provider, MD  Multiple Vitamins-Minerals (MULTIVITAMIN PO) Take 1 tablet by mouth daily.   Yes Historical  Provider, MD  multivitamin-lutein (OCUVITE-LUTEIN) CAPS capsule Take 1 capsule by mouth daily.   Yes Historical Provider, MD  polyethylene glycol powder (GLYCOLAX/MIRALAX) powder Take 17 g by mouth 2 (two) times daily as needed. 06/02/16  Yes [place], MD  Probiotic Product (PROBIOTIC DAILY PO) Take 1 tablet by mouth daily.   Yes Historical Provider, MD  rivaroxaban (XARELTO) 20 MG TABS tablet Take 1 tablet (20 mg total) by mouth daily with supper. 11/06/15  Yes Minus Breeding, MD  tamsulosin (FLOMAX) 0.4 MG CAPS capsule TAKE 1/2 CAPSULE (0.2 MG TOTAL) BY MOUTH DAILY. 12/11/15  Yes [name], MD  timolol (TIMOPTIC) 0.5 % ophthalmic solution Place 1 drop into the left eye daily.  12/04/15  Yes Historical Provider, MD  Turmeric (CURCUMIN 95) 500 MG CAPS Take 500 mg by mouth daily.   Yes Historical Provider, MD  vitamin C (ASCORBIC ACID) 500 MG tablet Take 500 mg by mouth daily.   Yes Historical Provider, MD   Social History   Social History  . Marital status: Married    Spouse name: Chad Russell  . Number of children: 0  . Years of education: 61   Occupational History  . 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 Smoking status: Former Smoker    Packs/day: 1.00    Years: 3.00    Types: Cigarettes    Quit date: 03/17/1955  . Smokeless tobacco: Former Systems developer  . Alcohol use 4.2 oz/week    7 Standard drinks or equivalent per week     Comment: daily  . Drug use: No  . Sexual activity: No   Other Topics Concern  . 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  Has no children.     Review of Systems  Gastrointestinal: Positive for constipation. Negative for abdominal pain.      Objective:   Physical Exam  Constitutional: He is oriented to person, place, and time.  He appears well-developed and well-nourished. No distress.  HENT:  Head: Normocephalic and atraumatic.  Eyes: Conjunctivae and EOM are normal.  Neck: Neck supple. No tracheal deviation present.  Cardiovascular: Normal rate, regular rhythm and normal heart sounds.   Pulmonary/Chest: Effort normal and breath sounds normal. No respiratory distress.  Genitourinary:  Genitourinary Comments: Foley catheter in place with moderate yellow urine. Normal rectal exam. Without stool impaction. Enlarged prostate but non-tender. No blood seen.  Musculoskeletal: Normal range of motion.  Neurological: He is alert and oriented to person, place, and time.  Skin: Skin is warm and dry.  Psychiatric: He has a normal mood and affect. His behavior is normal.  Nursing note and vitals reviewed.  Vitals:   06/08/16 1411  BP: (!) 166/73  Pulse: (!) 37  Resp: 17  Temp: 98.4 F (36.9 C)  TempSrc: Oral  SpO2: 97%  Weight: 176 lb (79.8 kg)  Height: 5' 11"  (1.803 m)      Assessment & Plan:   TYVION EDMONDSON is a 83 y.o. male Constipation, unspecified constipation type  - No fecal impaction appreciated. Denies abdominal pain.  -Increase fluids, fiber, increase MiraLAX to twice a day for the next 2 days, then 3 times a day if no relief. Continue Colace  -  recheck in 4 days if not improving. Sooner if worse.  No orders of the defined types were placed in this encounter.  Patient Instructions   I do not feel any fecal impaction today. Increase the MiraLAX to twice per day for the next 2 days, then if no relief increase to 3 times per day. Continue Colace once per day. Make sure you drink plenty of fluids, fiber with fruits and vegetables in the diet as well as whole grains.   If not improving in the next 3-4 days, return for recheck.  Sooner if worse.   Constipation, Adult Constipation is when a person has fewer bowel movements in a week than normal, has difficulty having a bowel movement, or has stools  that are dry, hard, or larger than normal. Constipation may be caused by an underlying condition. It may become worse with age if a person takes certain medicines and does not take in enough fluids. Follow these instructions at home: Eating and drinking    Eat foods that have a lot of fiber, such as fresh fruits and vegetables, whole grains, and beans.  Limit foods that are high in fat, low in fiber, or overly processed, such as french fries, hamburgers, cookies, candies, and soda.  Drink enough fluid to keep your urine clear or pale yellow. General instructions   Exercise regularly or as told by your health care provider.  Go to the restroom when you have the urge to go. Do not hold it in.  Take over-the-counter and prescription medicines only as told by your health care provider. These include any fiber supplements.  Practice pelvic floor retraining exercises, such as deep breathing while relaxing the lower abdomen and pelvic floor relaxation during bowel movements.  Watch your condition for any changes.  Keep all follow-up visits as told by your health care provider. This is important. Contact a health care provider if:  You have pain that gets worse.  You have a fever.  You do not have a bowel movement after 4 days.  You vomit.  You are not hungry.  You lose weight.  You are bleeding from the anus.  You have thin, pencil-like stools. Get help right away if:  You have a fever and your symptoms suddenly get worse.  You leak stool or have blood in your stool.  Your abdomen is bloated.  You have severe pain in your abdomen.  You feel dizzy or you faint. This information is not intended to replace advice given to you by your health care provider. Make sure you discuss any questions you have with your health care provider. Document Released: 11/07/2003 Document Revised: 08/29/2015 Document Reviewed: 07/30/2015 Elsevier Interactive Patient Education  2017 [employer].    IF you received an x-ray today, you will receive an invoice from [facility]. Please contact [facility] at [phone] with questions or concerns regarding your invoice.   IF you received labwork today, you will receive an invoice from [place]. Please contact [facility] at [phone] with questions  or concerns regarding your invoice.   Our billing staff will not be able to assist you with questions regarding bills from these companies.  You will be contacted with the lab results as soon as they are available. The fastest way to get your results is to activate your My Chart account. Instructions are located on the last page of this paperwork. If you have not heard from [place] regarding the results in 2 weeks, please contact this office.       I personally performed the services described in this documentation, which was scribed in my presence. The recorded information has been reviewed and considered for accuracy and completeness, addended by me as needed, and agree with information above.  Signed,   [name], MD [facility] at [place].  06/08/16 3:05 PM

## 2016-06-08 NOTE — Patient Instructions (Addendum)
I do not feel any fecal impaction today. Increase the MiraLAX to twice per day for the next 2 days, then if no relief increase to 3 times per day. Continue Colace once per day. Make sure you drink plenty of fluids, fiber with fruits and vegetables in the diet as well as whole grains.   If not improving in the next 3-4 days, return for recheck.  Sooner if worse.   Constipation, Adult Constipation is when a person has fewer bowel movements in a week than normal, has difficulty having a bowel movement, or has stools that are dry, hard, or larger than normal. Constipation may be caused by an underlying condition. It may become worse with age if a person takes certain medicines and does not take in enough fluids. Follow these instructions at home: Eating and drinking    Eat foods that have a lot of fiber, such as fresh fruits and vegetables, whole grains, and beans.  Limit foods that are high in fat, low in fiber, or overly processed, such as french fries, hamburgers, cookies, candies, and soda.  Drink enough fluid to keep your urine clear or pale yellow. General instructions   Exercise regularly or as told by your health care provider.  Go to the restroom when you have the urge to go. Do not hold it in.  Take over-the-counter and prescription medicines only as told by your health care provider. These include any fiber supplements.  Practice pelvic floor retraining exercises, such as deep breathing while relaxing the lower abdomen and pelvic floor relaxation during bowel movements.  Watch your condition for any changes.  Keep all follow-up visits as told by your health care provider. This is important. Contact a health care provider if:  You have pain that gets worse.  You have a fever.  You do not have a bowel movement after 4 days.  You vomit.  You are not hungry.  You lose weight.  You are bleeding from the anus.  You have thin, pencil-like stools. Get help right away  if:  You have a fever and your symptoms suddenly get worse.  You leak stool or have blood in your stool.  Your abdomen is bloated.  You have severe pain in your abdomen.  You feel dizzy or you faint. This information is not intended to replace advice given to you by your health care provider. Make sure you discuss any questions you have with your health care provider. Document Released: 11/07/2003 Document Revised: 08/29/2015 Document Reviewed: 07/30/2015 Elsevier Interactive Patient Education  2017 [employer].    IF you received an x-ray today, you will receive an invoice from [facility]. Please contact [facility] at [phone] with questions or concerns regarding your invoice.   IF you received labwork today, you will receive an invoice from [facility]. Please contact [facility] at [phone] with questions or concerns regarding your invoice.   Our billing staff will not be able to assist you with questions regarding bills from these companies.  You will be contacted with the lab results as soon as they are available. The fastest way to get your results is to activate your My Chart account. Instructions are located on the last page of this paperwork. If you have not heard from [place] regarding the results in 2 weeks, please contact this office.

## 2016-06-09 DIAGNOSIS — H353211 Exudative age-related macular degeneration, right eye, with active choroidal neovascularization: Secondary | ICD-10-CM | POA: Diagnosis not present

## 2016-06-09 DIAGNOSIS — H353133 Nonexudative age-related macular degeneration, bilateral, advanced atrophic without subfoveal involvement: Secondary | ICD-10-CM | POA: Diagnosis not present

## 2016-06-09 DIAGNOSIS — H353221 Exudative age-related macular degeneration, left eye, with active choroidal neovascularization: Secondary | ICD-10-CM | POA: Diagnosis not present

## 2016-06-16 DIAGNOSIS — R310 Gross hematuria: Secondary | ICD-10-CM | POA: Diagnosis not present

## 2016-06-16 DIAGNOSIS — R338 Other retention of urine: Secondary | ICD-10-CM | POA: Diagnosis not present

## 2016-06-19 ENCOUNTER — Telehealth: Payer: Self-pay | Admitting: Hematology

## 2016-06-19 ENCOUNTER — Encounter: Payer: Self-pay | Admitting: Hematology

## 2016-06-19 NOTE — Telephone Encounter (Signed)
Appt has been scheduled for the pt to see Dr. Irene Limbo on 5/22 at 1pm. Will mail a letter w/appt date and time. Notified Caryl Pina from the referring to notify the pt w/the appt date and time.

## 2016-06-22 DIAGNOSIS — H353221 Exudative age-related macular degeneration, left eye, with active choroidal neovascularization: Secondary | ICD-10-CM | POA: Diagnosis not present

## 2016-06-22 DIAGNOSIS — H353133 Nonexudative age-related macular degeneration, bilateral, advanced atrophic without subfoveal involvement: Secondary | ICD-10-CM | POA: Diagnosis not present

## 2016-06-22 DIAGNOSIS — H353211 Exudative age-related macular degeneration, right eye, with active choroidal neovascularization: Secondary | ICD-10-CM | POA: Diagnosis not present

## 2016-07-13 ENCOUNTER — Encounter: Payer: Self-pay | Admitting: Hematology

## 2016-07-16 DIAGNOSIS — R338 Other retention of urine: Secondary | ICD-10-CM | POA: Diagnosis not present

## 2016-07-16 DIAGNOSIS — R339 Retention of urine, unspecified: Secondary | ICD-10-CM | POA: Diagnosis not present

## 2016-07-20 DIAGNOSIS — R338 Other retention of urine: Secondary | ICD-10-CM | POA: Diagnosis not present

## 2016-07-21 DIAGNOSIS — N3000 Acute cystitis without hematuria: Secondary | ICD-10-CM | POA: Diagnosis not present

## 2016-07-21 DIAGNOSIS — N401 Enlarged prostate with lower urinary tract symptoms: Secondary | ICD-10-CM | POA: Diagnosis not present

## 2016-07-21 DIAGNOSIS — R338 Other retention of urine: Secondary | ICD-10-CM | POA: Diagnosis not present

## 2016-08-02 DIAGNOSIS — H43812 Vitreous degeneration, left eye: Secondary | ICD-10-CM | POA: Diagnosis not present

## 2016-08-02 DIAGNOSIS — H353222 Exudative age-related macular degeneration, left eye, with inactive choroidal neovascularization: Secondary | ICD-10-CM | POA: Diagnosis not present

## 2016-08-02 DIAGNOSIS — H353123 Nonexudative age-related macular degeneration, left eye, advanced atrophic without subfoveal involvement: Secondary | ICD-10-CM | POA: Diagnosis not present

## 2016-08-02 DIAGNOSIS — H2513 Age-related nuclear cataract, bilateral: Secondary | ICD-10-CM | POA: Diagnosis not present

## 2016-08-06 DIAGNOSIS — H353222 Exudative age-related macular degeneration, left eye, with inactive choroidal neovascularization: Secondary | ICD-10-CM | POA: Diagnosis not present

## 2016-08-06 DIAGNOSIS — H2513 Age-related nuclear cataract, bilateral: Secondary | ICD-10-CM | POA: Diagnosis not present

## 2016-08-06 DIAGNOSIS — H353113 Nonexudative age-related macular degeneration, right eye, advanced atrophic without subfoveal involvement: Secondary | ICD-10-CM | POA: Diagnosis not present

## 2016-08-06 DIAGNOSIS — H353211 Exudative age-related macular degeneration, right eye, with active choroidal neovascularization: Secondary | ICD-10-CM | POA: Diagnosis not present

## 2016-08-11 DIAGNOSIS — R338 Other retention of urine: Secondary | ICD-10-CM | POA: Diagnosis not present

## 2016-08-13 ENCOUNTER — Other Ambulatory Visit: Payer: Self-pay | Admitting: Cardiology

## 2016-08-13 ENCOUNTER — Telehealth: Payer: Self-pay | Admitting: Cardiology

## 2016-08-13 DIAGNOSIS — N401 Enlarged prostate with lower urinary tract symptoms: Secondary | ICD-10-CM | POA: Diagnosis not present

## 2016-08-13 DIAGNOSIS — I482 Chronic atrial fibrillation, unspecified: Secondary | ICD-10-CM

## 2016-08-13 DIAGNOSIS — R338 Other retention of urine: Secondary | ICD-10-CM | POA: Diagnosis not present

## 2016-08-13 NOTE — Telephone Encounter (Signed)
Last SCr was drawn 3 months ago, but from ER visit for urinary retention.  SCr had jumped from 0.9 to 1.8.  Has not been drawn since.    Will refill the Xarelto for 30 days at 20 mg, in the meantime patient will go to lab next week for a repeat BMET

## 2016-08-13 NOTE — Telephone Encounter (Signed)
New message         [facility] Pre-operative Risk Assessment    Request for surgical clearance:  1. What type of surgery is being performed?  TURP  2. When is this surgery scheduled? 08-19-16  3. Are there any medications that need to be held prior to surgery and how long? Hold xarelto?  If yes, how long.  Also, need cardiac clearance  4. Name of physician performing surgery?  Dr [name]  5. What is your office phone and fax number? fax [phone].    Chad Russell 08/13/2016, 3:13 PM  _________________________________________________________________   (provider comments below)

## 2016-08-15 NOTE — Telephone Encounter (Signed)
I would like for him to come in for preop appt prior to clearing him.  Needs to be seen next week.

## 2016-08-17 NOTE — Telephone Encounter (Signed)
Leave message for pt to call back about scheduling an appt.Marland KitchenMarland Kitchen

## 2016-08-18 NOTE — Telephone Encounter (Signed)
appt made with Bernerd Pho July 3rd @ 330

## 2016-08-23 NOTE — Progress Notes (Signed)
Cardiology Office Note    Date:  08/24/2016   ID:  Chad Russell 1927-01-10, MRN [id number]  PCP:  Chad Agreste, MD  Cardiologist: Dr. Percival Russell   Chief Complaint  Patient presents with  . Pre-op Exam    History of Present Illness:    Chad Russell is a 83 y.o. male with past medical history of persistent atrial fibrillation (on Xarelto), CHB (never required PPM placement), HTN, and minimal CAD by cath in 2013 who presents to the office today for cardiac clearance.   He was last examined by Dr. Percival Russell in 10/2015 and was doing well from a cardiac perspective at that time. He denied any recent chest pain or dyspnea on exertion. He experienced episodes of hematuria in 04/2016 and held Xarelto at that time until instructed to resume per Russell.   In talking with the patient today, he reports doing well from a cardiac perspective since his last office visit. He is frustrated with having to use a urinary catheter and is very excited about an upcoming TURP procedure to improve his urinary issues. This is to be performed by Dr. Jeffie Russell with Chad Russell   He has baseline bradycardia and reports a history of this for the past 15+ years. He has been asymptomatic with this and denies any recent lightheadedness, dizziness, or presyncope. Due to his asymptomatic state, he has never required PPM placement.    He is active at baseline for his age and can walk around the grocery store, to the mailbox, and climb a flight of stairs without any exertional chest pain or dyspnea on exertion. No recent orthopnea, PND, or lower extremity edema.   He remains on Xarelto for atrial fibrillation and denies any evidence of active bleeding. Was intermittently holding this over the past few months secondary to hematuria. His last surgery was hernia repair in 2015 and he denies any known complications at that time.    Past Medical History:  Diagnosis Date  . Arthritis   . Atrial fibrillation  ([place])   . Balance problem 10/31/2013  . BPH (benign prostatic hyperplasia) 2014   Russell Dr Chad Russell   . Bradycardia    a. baseline HR in the 30's. Asymptomatic - no history of PPM placement.   Marland Kitchen CAD (coronary artery disease)    LHC 03/16/11: Mid LAD 10-20%, proximal circumflex 10%, mid RCA 20%, EF 55%.  . Cataract   . Complete heart block ([place])   . GERD (gastroesophageal reflux disease)    takes occasional  zantac  . Hx of echocardiogram    Echo 02/2011: EF 65-70%, normal wall motion, MAC, mild MR, mild LAE, mild RVE  . Hypertension   . OSA (obstructive sleep apnea)   . Talar fracture    casted no surg    Past Surgical History:  Procedure Laterality Date  . COLONOSCOPY     Repeated and normal in 2011  . hemmorhoidectomy    . INGUINAL HERNIA REPAIR Right 02/26/2014   Procedure: LAPAROSCOPIC RIGHT INGUINAL HERNIA REPAIR;  Surgeon: Chad Ok, MD;  Location: [facility];  Service: General;  Laterality: Right;  . INSERTION OF MESH Right 02/26/2014   Procedure: INSERTION OF MESH;  Surgeon: Chad Ok, MD;  Location: [place];  Service: General;  Laterality: Right;  . LEFT HEART CATHETERIZATION WITH CORONARY ANGIOGRAM N/A 03/16/2011   Procedure: LEFT HEART CATHETERIZATION WITH CORONARY ANGIOGRAM;  Surgeon: Chad M Martinique, MD;  Location: [facility] CATH LAB;  Service:  Cardiovascular;  Laterality: N/A;  . PROSTATE BIOPSY     negative - Chad Russell    Current Medications: Outpatient Medications Prior to Visit  Medication Sig Dispense Refill  . Docusate Sodium (COLACE PO) Take by mouth.    Marland Kitchen lisinopril-hydrochlorothiazide (PRINZIDE,ZESTORETIC) 10-12.5 MG tablet TAKE 1 TABLET BY MOUTH DAILY. 90 tablet 1  . Misc Natural Products (DAILY HERBS PROSTATE PO) Take 1 tablet by mouth daily.    . Multiple Vitamins-Minerals (MULTIVITAMIN PO) Take 1 tablet by mouth daily.    . multivitamin-lutein (OCUVITE-LUTEIN) CAPS capsule Take 1 capsule by mouth daily.    . Probiotic Product  (PROBIOTIC DAILY PO) Take 1 tablet by mouth daily.    . timolol (TIMOPTIC) 0.5 % ophthalmic solution Place 1 drop into the left eye daily.     . Turmeric (CURCUMIN 95) 500 MG CAPS Take 500 mg by mouth daily.    . vitamin C (ASCORBIC ACID) 500 MG tablet Take 500 mg by mouth daily.    Chad Russell 20 MG TABS tablet TAKE ONE TABLET BY MOUTH DAILY WITH SUPPER 30 tablet 0  . polyethylene glycol powder (GLYCOLAX/MIRALAX) powder Take 17 g by mouth 2 (two) times daily as needed. 3350 g 1  . tamsulosin (FLOMAX) 0.4 MG CAPS capsule TAKE 1/2 CAPSULE (0.2 MG TOTAL) BY MOUTH DAILY. 90 capsule 1   No facility-administered medications prior to visit.      Allergies:   Doxycycline and Keflex [cephalexin]   Social History   Social History  . Marital status: Married    Spouse name: Chad Russell  . Number of children: 0  . Years of education: 34   Occupational History  . 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 Smoking status: Former Smoker    Packs/day: 1.00    Years: 3.00    Types: Cigarettes    Quit date: 03/17/1955  . Smokeless tobacco: Former Systems developer  . Alcohol use 4.2 oz/week    7 Standard drinks or equivalent per week     Comment: daily  . Drug use: No  . Sexual activity: No   Other Topics Concern  . 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  Has no children.       Family History:  The patient's family history includes Hypertension in his mother; Multiple sclerosis in his sister.   Review of Systems:   Please see the history of present illness.     General:  No chills, fever, night sweats or weight changes.  Cardiovascular:  No chest pain, dyspnea on exertion, edema, orthopnea, palpitations, paroxysmal nocturnal dyspnea. Dermatological: No rash, lesions/masses Respiratory: No  cough, dyspnea Urologic: Positive for hematuria and dysuria. Abdominal:   No nausea, vomiting, diarrhea, bright red blood per rectum, melena, or hematemesis Neurologic:  No visual changes, wkns, changes in mental status. All other systems reviewed and are otherwise negative except as noted above.   Physical Exam:    VS:  BP 124/82   Pulse (!) 38   Ht _0  (1.803 m)   Wt 173 lb (78.5 kg)   BMI 24.13 kg/m    General: Well developed, well nourished Caucasian male appearing in no acute distress. Head: Normocephalic, atraumatic, sclera non-icteric, no xanthomas, nares  are without discharge.  Neck: No carotid bruits. JVD not elevated.  Lungs: Respirations regular and unlabored, without wheezes or rales.  Heart: Irregularly irregular, bradycardiac. No S3 or S4.  No murmur, no rubs, or gallops appreciated. Abdomen: Soft, non-tender, non-distended with normoactive bowel sounds. No hepatomegaly. No rebound/guarding. No obvious abdominal masses. Msk:  Strength and tone appear normal for age. No joint deformities or effusions. Extremities: No clubbing or cyanosis. No lower extremity edema.  Distal pedal pulses are 2+ bilaterally. Neuro: Alert and oriented X 3. Moves all extremities spontaneously. No focal deficits noted. Psych:  Responds to questions appropriately with a normal affect. Skin: No rashes or lesions noted  Wt Readings from Last 3 Encounters:  08/24/16 173 lb (78.5 kg)  06/08/16 176 lb (79.8 kg)  06/02/16 169 lb 3.2 oz (76.7 kg)     Studies/Labs Reviewed:   EKG:  EKG is ordered today.  The ekg ordered today demonstrates atrial fibrillation, HR 38, with known RBBB.   Recent Labs: 12/11/2015: TSH 2.71 05/13/2016: ALT 14; BUN 28; Creatinine, Ser 1.80; Hemoglobin 14.5; Platelets 223; Potassium 4.3; Sodium 140   Lipid Panel    Component Value Date/Time   CHOL 132 05/13/2016 1038   TRIG 56 03/16/2011 0510   HDL 62 03/16/2011 0510   CHOLHDL 2.4 03/16/2011 0510   VLDL 11  03/16/2011 0510   LDLCALC 73 03/16/2011 0510    Additional studies/ records that were reviewed today include:   Cardiac Catheterization: 03/16/2011 Coronary dominance: right  Left mainstem: Normal.  Left anterior descending (LAD): This is a large vessel that gives rise to 3 diagonal branches. There are mild irregularities in the mid vessel of approximately 10-20%.  Left circumflex (LCx): Distrust to one large obtuse marginal vessel and then terminates in a smaller marginal branch. There is minor irregularity in the proximal vessel less than 10%.  Right coronary artery (RCA): This is a dominant vessel. There is diffuse irregularity in the mid vessel up to 20%.  Left ventriculography: Left ventricular systolic function is overall normal, LVEF is estimated at 55%, there is hypokinesis of the basal anterior wall. There is no significant mitral insufficiency.  Final Conclusions:   1. Nonobstructive atherosclerotic coronary disease. 2. Good overall left ventricular systolic function with wall motion abnormality involving the basal anterior wall.  Recommendations: Recommend medical therapy. We'll obtain an echocardiogram.  Assessment:    1. Preoperative cardiovascular examination   2. Persistent atrial fibrillation (HCC)   3. Current use of long term anticoagulation   4. Complete heart block ([place])   5. RBBB      Plan:   In order of problems listed above:  1. Preoperative Cardiovascular Examination for TURP Procedure - he has been experiencing episodes of dysuria and hematuria with plans to undergo a TURP procedure by Dr. Jeffie Russell with Chad Russell. - he denies any recent anginal symptoms. He can walk around the grocery store, to the mailbox, and climb a flight of stairs without any exertional chest pain or dyspnea on exertion. No recent orthopnea, PND, or lower extremity edema. He has baseline bradycardia but is asymptomatic with this. - Cath in 2013 showed minimal CAD and  EKG today is without acute ischemic changes. Per the RCRI stratification criteria, his risk of a major cardiac event is 0.4%. No further testing is indicated at this time in the setting of the procedure itself being of lower risk and him not having any recent anginal symptoms. He is of acceptable cardiac risk to proceed. He  can hold Xarelto for 48 hours prior to the procedure and resume once safe to do so from a Russell perspective following surgery.   2. Persistent Atrial Fibrillation/ Use of Long-term Anticoagulation  - he denies any recent dyspnea on exertion or palpitations. Not on any AV nodal blocking agents in the setting of his baseline bradycardia. - This patients CHA2DS2-VASc Score and unadjusted Ischemic Stroke Rate (% per year) is equal to 2.2 % stroke rate/year from a score of 2 (Age (2)). He was experiencing episodes of hematuria in 04/2016, now resolved. Can hold Xarelto 48 hours prior to his scheduled procedure. No indication for bridging as he has no history of a TIA, CVA, PE, or DVT.   3. Complete Heart Block - baseline HR has been in the mid to high-30's for years. He denies any recent lightheadedness, dizziness, or presyncope. Has never required PPM placement due to his asymptomatic state.  - monitor HR response closely while receiving general anesthesia.   4. RBBB - chronic. Noted on repeat EKG today.   Medication Adjustments/Labs and Tests Ordered: Current medicines are reviewed at length with the patient today.  Concerns regarding medicines are outlined above.  Medication changes, Labs and Tests ordered today are listed in the Patient Instructions below. Patient Instructions  Medication Instructions:  Your physician recommends that you continue on your current medications as directed. Please refer to the Current Medication list given to you today.  Labwork: none  Testing/Procedures: none  Follow-Up: Your physician wants you to follow-up in: 6 month ov with Dr Allena Napoleon will receive a reminder letter in the mail two months in advance. If you don't receive a letter, please call our office to schedule the follow-up appointment.  Any Other Special Instructions Will Be Listed Below (If Applicable). YOU ARE CLEARED FOR SURGERY WITH DR WRENN, WILL SEND OVER CLEARANCE   If you need a refill on your cardiac medications before your next appointment, please call your pharmacy.   Signed, Erma Heritage, PA-C  08/24/2016 9:01 PM    [facility] [place], [place], [place]  [address] Phone: [phone]; Fax: [phone]  [address], [place], [place] [address] Phone: [phone]

## 2016-08-24 ENCOUNTER — Encounter: Payer: Self-pay | Admitting: Student

## 2016-08-24 ENCOUNTER — Ambulatory Visit (INDEPENDENT_AMBULATORY_CARE_PROVIDER_SITE_OTHER): Payer: PPO | Admitting: Student

## 2016-08-24 VITALS — BP 124/82 | HR 38 | Ht 71.0 in | Wt 173.0 lb

## 2016-08-24 DIAGNOSIS — I481 Persistent atrial fibrillation: Secondary | ICD-10-CM

## 2016-08-24 DIAGNOSIS — Z7901 Long term (current) use of anticoagulants: Secondary | ICD-10-CM | POA: Diagnosis not present

## 2016-08-24 DIAGNOSIS — I4819 Other persistent atrial fibrillation: Secondary | ICD-10-CM

## 2016-08-24 DIAGNOSIS — Z7189 Other specified counseling: Secondary | ICD-10-CM | POA: Insufficient documentation

## 2016-08-24 DIAGNOSIS — Z01810 Encounter for preprocedural cardiovascular examination: Secondary | ICD-10-CM | POA: Insufficient documentation

## 2016-08-24 DIAGNOSIS — I4510 Unspecified right bundle-branch block: Secondary | ICD-10-CM | POA: Diagnosis not present

## 2016-08-24 DIAGNOSIS — I442 Atrioventricular block, complete: Secondary | ICD-10-CM

## 2016-08-24 NOTE — Patient Instructions (Addendum)
Medication Instructions:  Your physician recommends that you continue on your current medications as directed. Please refer to the Current Medication list given to you today.  Labwork: none  Testing/Procedures: none  Follow-Up: Your physician wants you to follow-up in: 6 month ov with Dr Allena Napoleon will receive a reminder letter in the mail two months in advance. If you don't receive a letter, please call our office to schedule the follow-up appointment.  Any Other Special Instructions Will Be Listed Below (If Applicable). YOU ARE CLEARED FOR SURGERY WITH DR WRENN, WILL SEND OVER CLEARANCE   If you need a refill on your cardiac medications before your next appointment, please call your pharmacy.

## 2016-08-27 ENCOUNTER — Other Ambulatory Visit: Payer: Self-pay | Admitting: Urology

## 2016-08-29 ENCOUNTER — Encounter (HOSPITAL_COMMUNITY): Payer: Self-pay

## 2016-08-29 ENCOUNTER — Emergency Department (HOSPITAL_COMMUNITY)
Admission: EM | Admit: 2016-08-29 | Discharge: 2016-08-29 | Disposition: A | Discharge status: Home / Self Care | Payer: PPO | Attending: Emergency Medicine | Admitting: Emergency Medicine

## 2016-08-29 DIAGNOSIS — Y733 Surgical instruments, materials and gastroenterology and urology devices (including sutures) associated with adverse incidents: Secondary | ICD-10-CM | POA: Insufficient documentation

## 2016-08-29 DIAGNOSIS — I10 Essential (primary) hypertension: Secondary | ICD-10-CM | POA: Insufficient documentation

## 2016-08-29 DIAGNOSIS — R339 Retention of urine, unspecified: Secondary | ICD-10-CM | POA: Diagnosis present

## 2016-08-29 DIAGNOSIS — Z7901 Long term (current) use of anticoagulants: Secondary | ICD-10-CM | POA: Insufficient documentation

## 2016-08-29 DIAGNOSIS — T83091A Other mechanical complication of indwelling urethral catheter, initial encounter: Secondary | ICD-10-CM

## 2016-08-29 DIAGNOSIS — I2510 Atherosclerotic heart disease of native coronary artery without angina pectoris: Secondary | ICD-10-CM | POA: Insufficient documentation

## 2016-08-29 DIAGNOSIS — Z87891 Personal history of nicotine dependence: Secondary | ICD-10-CM | POA: Insufficient documentation

## 2016-08-29 DIAGNOSIS — Z79899 Other long term (current) drug therapy: Secondary | ICD-10-CM | POA: Insufficient documentation

## 2016-08-29 LAB — URINALYSIS, ROUTINE W REFLEX MICROSCOPIC
GLUCOSE, UA: NEGATIVE mg/dL
Ketones, ur: NEGATIVE mg/dL
NITRITE: NEGATIVE
Protein, ur: >300 mg/dL — AB

## 2016-08-29 LAB — URINALYSIS, MICROSCOPIC (REFLEX)

## 2016-08-29 NOTE — ED Notes (Signed)
Bed: WA12 Expected date:  Expected time:  Means of arrival:  Comments: 

## 2016-08-29 NOTE — ED Triage Notes (Signed)
Pt has blocked urinary cath.  Denies pain.  Has prostate surgery coming up. Pt appears comfortable at this time

## 2016-08-30 NOTE — ED Provider Notes (Signed)
[facility] DEPT Provider Note   CSN: [id number] Arrival date & time: 08/29/16  1713     History   Chief Complaint Chief Complaint  Patient presents with  . Urinary Retention    HPI Chad Russell is a 83 y.o. male.  83 year old male with extensive past medical history below including BPH who presents with Foley obstruction. This afternoon, the patient's Foley catheter stopped draining. This happened a few times this week but spontaneously resolved. He does note that as soon as he arrived to the ED room his urine seemed to be flowing again. He denies any dysuria, abdominal pain, fevers, or change in quality of his urine. He recently finished antibiotics for UTI and denies any ongoing symptoms. He is scheduled for a preoperative appointment this week to have a TURP procedure this month with Dr. [name].   The history is provided by the patient.    Past Medical History:  Diagnosis Date  . Arthritis   . Atrial fibrillation ([place])   . Balance problem 10/31/2013  . BPH (benign prostatic hyperplasia) 2014   Urology Dr [name] with [facility] [phone]   . Bradycardia    a. baseline HR in the 30's. Asymptomatic - no history of PPM placement.   Marland Kitchen CAD (coronary artery disease)    LHC 03/16/11: Mid LAD 10-20%, proximal circumflex 10%, mid RCA 20%, EF 55%.  . Cataract   . Complete heart block ([place])   . GERD (gastroesophageal reflux disease)    takes occasional  zantac  . Hx of echocardiogram    Echo 02/2011: EF 65-70%, normal wall motion, MAC, mild MR, mild LAE, mild RVE  . Hypertension   . OSA (obstructive sleep apnea)   . Talar fracture    casted no surg    Patient Active Problem List   Diagnosis Date Noted  . Preoperative cardiovascular examination 08/24/2016  . Dysuria 05/13/2016  . Urinary retention 05/13/2016  . Nausea without vomiting 05/13/2016  . Pelvic pain 05/13/2016  . Balance problem 10/31/2013  . OSA (obstructive sleep apnea) 10/31/2013  . BPH (benign prostatic  hyperplasia) 08/28/2012  . Atrial fibrillation ([place]) 06/08/2012  . Bacteremia 04/28/2012  . Hyperplasia of prostate with lower urinary tract symptoms (LUTS) 04/28/2012  . Calf pain 04/28/2012  . Moderate malnutrition ([place]) 04/28/2012  . Influenza-like illness 04/27/2012  . Infection of urinary tract - recurrent 04/26/2012  . NSTEMI (non-ST elevated myocardial infarction) ([place]) 03/15/2011  . Complete heart block ([place]) 03/15/2011  . HTN (hypertension) 03/15/2011    Past Surgical History:  Procedure Laterality Date  . COLONOSCOPY     Repeated and normal in 2011  . hemmorhoidectomy    . INGUINAL HERNIA REPAIR Right 02/26/2014   Procedure: LAPAROSCOPIC RIGHT INGUINAL HERNIA REPAIR;  Surgeon: Ralene Ok, MD;  Location: [facility];  Service: General;  Laterality: Right;  . INSERTION OF MESH Right 02/26/2014   Procedure: INSERTION OF MESH;  Surgeon: Ralene Ok, MD;  Location: [facility];  Service: General;  Laterality: Right;  . LEFT HEART CATHETERIZATION WITH CORONARY ANGIOGRAM N/A 03/16/2011   Procedure: LEFT HEART CATHETERIZATION WITH CORONARY ANGIOGRAM;  Surgeon: Peter M Martinique, MD;  Location: [facility] CATH LAB;  Service: Cardiovascular;  Laterality: N/A;  . PROSTATE BIOPSY     negative - [facility]       Home Medications    Prior to Admission medications   Medication Sig Start Date End Date Taking? Authorizing Provider  Docusate Sodium (COLACE PO) Take 1 tablet by mouth at bedtime  as needed (constipation).    Yes [provider]  finasteride (PROSCAR) 5 MG tablet Take 5 mg by mouth at bedtime.   Yes [provider]  lisinopril-hydrochlorothiazide (PRINZIDE,ZESTORETIC) 10-12.5 MG tablet TAKE 1 TABLET BY MOUTH DAILY. 04/19/16  Yes Minus Breeding, MD  LUTEIN-ZEAXANTHIN PO Take 1 tablet by mouth at bedtime.   Yes [provider]  Multiple Vitamins-Minerals (PRESERVISION AREDS 2+MULTI VIT PO) Take 1 tablet by mouth 2 (two) times daily.   Yes [provider]  OVER THE COUNTER MEDICATION Take 1 tablet by mouth daily. Super Beta Prostate   Yes [provider]  timolol (TIMOPTIC) 0.5 % ophthalmic solution Place 1 drop into the left eye at bedtime.  12/04/15  Yes [provider]  XARELTO 20 MG TABS tablet TAKE ONE TABLET BY MOUTH DAILY WITH SUPPER 08/13/16  Yes Hochrein, Jeneen Rinks, MD  Multiple Vitamins-Minerals (MULTIVITAMIN PO) Take 1 tablet by mouth daily.    [provider]  nitrofurantoin, macrocrystal-monohydrate, (MACROBID) 100 MG capsule Take 100 mg by mouth daily. 07/27/16   [provider]  Probiotic Product (PROBIOTIC DAILY PO) Take 1 tablet by mouth daily.    [provider]  Turmeric (CURCUMIN 95) 500 MG CAPS Take 500 mg by mouth daily.    [provider]  vitamin C (ASCORBIC ACID) 500 MG tablet Take 500 mg by mouth daily.    [provider]    Family History Family History  Problem Relation Age of Onset  . Hypertension Mother   . Multiple sclerosis Sister     Social History Social History  Substance Use Topics  . Smoking status: Former Smoker    Packs/day: 1.00    Years: 3.00    Types: Cigarettes    Quit date: 03/17/1955  . Smokeless tobacco: Former Systems developer  . Alcohol use 4.2 oz/week    7 Standard drinks or equivalent per week     Comment: daily     Allergies   Doxycycline; Keflex [cephalexin]; and Wheat bran   Review of Systems Review of Systems All other systems reviewed and are negative except that which was mentioned in HPI   Physical Exam Updated Vital Signs BP (!) 182/77 (BP Location: Left Arm)   Pulse (!) 42   Temp 97.7 F (36.5 C) (Oral)   Resp 18   Wt 78 kg (172 lb)   SpO2 98%   BMI 23.99 kg/m   Physical Exam  Constitutional: He is oriented to person, place, and time. He appears well-developed and well-nourished. No distress.  HENT:  Head: Normocephalic and atraumatic.  Eyes: Conjunctivae are normal.  Neck: Neck supple.  Genitourinary:    Genitourinary Comments: Foley catheter draining yellow urine, small amount of white sediment in bag  Neurological: He is alert and oriented to person, place, and time.  Skin: Skin is warm and dry.  Psychiatric: He has a normal mood and affect. Judgment normal.  Nursing note and vitals reviewed.    ED Treatments / Results  Labs (all labs ordered are listed, but only abnormal results are displayed) Labs Reviewed  URINALYSIS, ROUTINE W REFLEX MICROSCOPIC - Abnormal; Notable for the following:       Result Value   Color, Urine GREEN (*)    APPearance TURBID (*)    Specific Gravity, Urine <1.005 (*)    pH >9.0 (*)    Hgb urine dipstick LARGE (*)    Bilirubin Urine SMALL (*)    Protein, ur >300 (*)  Leukocytes, UA LARGE (*)    All other components within normal limits  URINALYSIS, MICROSCOPIC (REFLEX) - Abnormal; Notable for the following:    Bacteria, UA MANY (*)    Squamous Epithelial / LPF TOO NUMEROUS TO COUNT (*)    All other components within normal limits  URINE CULTURE    EKG  EKG Interpretation None       Radiology No results found.  Procedures Procedures (including critical care time)  Medications Ordered in ED Medications - No data to display   Initial Impression / Assessment and Plan / ED Course  I have reviewed the triage vital signs and the nursing notes.  Pertinent labs that were available during my care of the patient were reviewed by me and considered in my medical decision making (see chart for details).     Foley replaced by ED nurse without problems. Urine culture sent but no sx of UTI therefore will just monitor for sx at home. Pt to f/u with urology clinic this week. Return precautions reviewed.  Final Clinical Impressions(s) / ED Diagnoses   Final diagnoses:  Obstruction of Foley catheter, initial encounter [facility])    New Prescriptions Discharge Medication List as of 08/29/2016  8:56 PM       Mera Gunkel, Wenda Overland, MD 08/30/16  [phone]

## 2016-08-31 DIAGNOSIS — R338 Other retention of urine: Secondary | ICD-10-CM | POA: Diagnosis not present

## 2016-08-31 LAB — URINE CULTURE
Culture: >=100000 — AB
Special Requests: NORMAL

## 2016-08-31 NOTE — Patient Instructions (Addendum)
TOBEN ACUNA  08/31/2016   Your procedure is scheduled on: 09-07-16  Report to [facility] Main  Entrance Take [place]  elevators to 3rd floor to  [place] [facility] [place].   Call this number if you have problems the morning of surgery [phone]   Remember: ONLY 1 PERSON MAY GO WITH YOU TO SHORT STAY TO GET  READY MORNING OF YOUR SURGERY.  Do not eat food or drink liquids :After Midnight.      Take these medicines the morning of surgery with A SIP OF WATER: NONE                                You may not have any metal on your body including hair pins and              piercings  Do not wear jewelry, make-up, lotions, powders or perfumes, deodorant              Men may shave face and neck.   Do not bring valuables to the hospital. [place].  Contacts, dentures or bridgework may not be worn into surgery.  Leave suitcase in the car. After surgery it may be brought to your room.              Please read over the following fact sheets you were given: _____________________________________________________________________            [facility] - Preparing for Surgery Before surgery, you can play an important role.  Because skin is not sterile, your skin needs to be as free of germs as possible.  You can reduce the number of germs on your skin by washing with CHG (chlorahexidine gluconate) soap before surgery.  CHG is an antiseptic cleaner which kills germs and bonds with the skin to continue killing germs even after washing. Please DO NOT use if you have an allergy to CHG or antibacterial soaps.  If your skin becomes reddened/irritated stop using the CHG and inform your nurse when you arrive at Short Stay. Do not shave (including legs and underarms) for at least 48 hours prior to the first CHG shower.  You may shave your face/neck. Please follow these instructions carefully:  1.  Shower with CHG Soap the night  before surgery and the  morning of Surgery.  2.  If you choose to wash your hair, wash your hair first as usual with your  normal  shampoo.  3.  After you shampoo, rinse your hair and body thoroughly to remove the  shampoo.                           4.  Use CHG as you would any other liquid soap.  You can apply chg directly  to the skin and wash                       Gently with a scrungie or clean washcloth.  5.  Apply the CHG Soap to your body ONLY FROM THE NECK DOWN.   Do not use on face/ open  Wound or open sores. Avoid contact with eyes, ears mouth and genitals (private parts).                       Wash face,  Genitals (private parts) with your normal soap.             6.  Wash thoroughly, paying special attention to the area where your surgery  will be performed.  7.  Thoroughly rinse your body with warm water from the neck down.  8.  DO NOT shower/wash with your normal soap after using and rinsing off  the CHG Soap.                9.  Pat yourself dry with a clean towel.            10.  Wear clean pajamas.            11.  Place clean sheets on your bed the night of your first shower and do not  sleep with pets. Day of Surgery : Do not apply any lotions/deodorants the morning of surgery.  Please wear clean clothes to the hospital/surgery center.  FAILURE TO FOLLOW THESE INSTRUCTIONS MAY RESULT IN THE CANCELLATION OF YOUR SURGERY PATIENT SIGNATURE_________________________________  NURSE SIGNATURE__________________________________  ________________________________________________________________________

## 2016-08-31 NOTE — Progress Notes (Signed)
LOV/ cardiology clearance Strader PA-C  08-24-16 epic  EKG 08-24-16 reviewed by cardiology "without acute ischemic changes " per Strader PA-C  UA 08-29-16 epic  ECHO 03-16-11 epic ; result reads "Aortic valve:  Mildly thickened, mildly calcified leaflets.Doppler:  There was no stenosis."

## 2016-09-01 ENCOUNTER — Encounter (HOSPITAL_COMMUNITY)
Admission: RE | Admit: 2016-09-01 | Discharge: 2016-09-01 | Disposition: A | Discharge status: Home / Self Care | Payer: PPO | Source: Ambulatory Visit | Attending: Urology | Admitting: Urology

## 2016-09-01 ENCOUNTER — Telehealth: Payer: Self-pay | Admitting: Emergency Medicine

## 2016-09-01 ENCOUNTER — Encounter (HOSPITAL_COMMUNITY): Payer: Self-pay

## 2016-09-01 ENCOUNTER — Emergency Department (HOSPITAL_COMMUNITY)
Admission: EM | Admit: 2016-09-01 | Discharge: 2016-09-01 | Disposition: A | Discharge status: Home / Self Care | Payer: PPO | Attending: Emergency Medicine | Admitting: Emergency Medicine

## 2016-09-01 DIAGNOSIS — I4891 Unspecified atrial fibrillation: Secondary | ICD-10-CM | POA: Diagnosis not present

## 2016-09-01 DIAGNOSIS — Z7901 Long term (current) use of anticoagulants: Secondary | ICD-10-CM | POA: Insufficient documentation

## 2016-09-01 DIAGNOSIS — I2510 Atherosclerotic heart disease of native coronary artery without angina pectoris: Secondary | ICD-10-CM | POA: Diagnosis not present

## 2016-09-01 DIAGNOSIS — I252 Old myocardial infarction: Secondary | ICD-10-CM | POA: Insufficient documentation

## 2016-09-01 DIAGNOSIS — N401 Enlarged prostate with lower urinary tract symptoms: Secondary | ICD-10-CM | POA: Insufficient documentation

## 2016-09-01 DIAGNOSIS — R339 Retention of urine, unspecified: Secondary | ICD-10-CM | POA: Insufficient documentation

## 2016-09-01 DIAGNOSIS — I10 Essential (primary) hypertension: Secondary | ICD-10-CM | POA: Insufficient documentation

## 2016-09-01 DIAGNOSIS — Z87891 Personal history of nicotine dependence: Secondary | ICD-10-CM | POA: Diagnosis not present

## 2016-09-01 DIAGNOSIS — Z79899 Other long term (current) drug therapy: Secondary | ICD-10-CM | POA: Diagnosis not present

## 2016-09-01 DIAGNOSIS — Z01818 Encounter for other preprocedural examination: Secondary | ICD-10-CM | POA: Insufficient documentation

## 2016-09-01 DIAGNOSIS — Z466 Encounter for fitting and adjustment of urinary device: Secondary | ICD-10-CM | POA: Diagnosis present

## 2016-09-01 DIAGNOSIS — N400 Enlarged prostate without lower urinary tract symptoms: Secondary | ICD-10-CM | POA: Insufficient documentation

## 2016-09-01 LAB — CBC WITH DIFFERENTIAL/PLATELET
BASOS ABS: 0 10*3/uL (ref 0.0–0.1)
BASOS PCT: 0 %
EOS ABS: 0.3 10*3/uL (ref 0.0–0.7)
EOS PCT: 3 %
HCT: 39.8 % (ref 39.0–52.0)
HEMOGLOBIN: 13.3 g/dL (ref 13.0–17.0)
LYMPHS ABS: 2.5 10*3/uL (ref 0.7–4.0)
Lymphocytes Relative: 22 %
MCH: 32.6 pg (ref 26.0–34.0)
MCHC: 33.4 g/dL (ref 30.0–36.0)
MCV: 97.5 fL (ref 78.0–100.0)
Monocytes Absolute: 1 10*3/uL (ref 0.1–1.0)
Monocytes Relative: 9 %
NEUTROS PCT: 66 %
Neutro Abs: 7.2 10*3/uL (ref 1.7–7.7)
PLATELETS: 231 10*3/uL (ref 150–400)
RBC: 4.08 MIL/uL — AB (ref 4.22–5.81)
RDW: 14.1 % (ref 11.5–15.5)
WBC: 11.1 10*3/uL — AB (ref 4.0–10.5)

## 2016-09-01 LAB — I-STAT CHEM 8, ED
BUN: 31 mg/dL — AB (ref 6–20)
CHLORIDE: 105 mmol/L (ref 101–111)
Calcium, Ion: 1.12 mmol/L — ABNORMAL LOW (ref 1.15–1.40)
Creatinine, Ser: 1.1 mg/dL (ref 0.61–1.24)
Glucose, Bld: 95 mg/dL (ref 65–99)
HEMATOCRIT: 41 % (ref 39.0–52.0)
Hemoglobin: 13.9 g/dL (ref 13.0–17.0)
Potassium: 4.6 mmol/L (ref 3.5–5.1)
SODIUM: 140 mmol/L (ref 135–145)
TCO2: 26 mmol/L (ref 0–100)

## 2016-09-01 LAB — URINALYSIS, ROUTINE W REFLEX MICROSCOPIC
BACTERIA UA: NONE SEEN
Bilirubin Urine: NEGATIVE
GLUCOSE, UA: NEGATIVE mg/dL
KETONES UR: NEGATIVE mg/dL
NITRITE: NEGATIVE
PH: 8 (ref 5.0–8.0)
PROTEIN: 100 mg/dL — AB
Specific Gravity, Urine: 1.012 (ref 1.005–1.030)
Squamous Epithelial / LPF: NONE SEEN

## 2016-09-01 MED ORDER — FOSFOMYCIN TROMETHAMINE 3 G PO PACK
3.0000 g | PACK | Freq: Once | ORAL | Status: AC
  Administered 2016-09-01: 3 g via ORAL
  Filled 2016-09-01: qty 3

## 2016-09-01 MED ORDER — LIDOCAINE HCL 2 % EX GEL
1.0000 "application " | Freq: Once | CUTANEOUS | Status: AC | PRN
  Administered 2016-09-01: 1 via URETHRAL
  Filled 2016-09-01: qty 11

## 2016-09-01 MED ORDER — ACETAMINOPHEN 325 MG PO TABS
650.0000 mg | ORAL_TABLET | Freq: Once | ORAL | Status: AC
  Administered 2016-09-01: 650 mg via ORAL
  Filled 2016-09-01: qty 2

## 2016-09-01 NOTE — Telephone Encounter (Signed)
Post ED Visit - Positive Culture Follow-up  Culture report reviewed by antimicrobial stewardship pharmacist:  []  Elenor Quinones, Pharm.D. []  Heide Guile, Pharm.D., BCPS AQ-ID []  Parks Neptune, Pharm.D., BCPS []  Alycia Rossetti, Pharm.D., BCPS []  [place], [place].D., BCPS, AAHIVP []  Legrand Como, Pharm.D., BCPS, AAHIVP []  Salome Arnt, PharmD, BCPS []  Dimitri Ped, PharmD, BCPS []  Vincenza Hews, PharmD, BCPS  Positive urine culture Treated with nitrofurantoin, organism sensitive to the same and no further patient follow-up is required at this time.  Hazle Nordmann 09/01/2016, 4:34 PM

## 2016-09-01 NOTE — Progress Notes (Signed)
CBCdiff, CMP , urinalysis 09-01-16 epic

## 2016-09-01 NOTE — ED Triage Notes (Signed)
Pt was seen on Sunday for a blocked catheter and it was replaced, since then he's had bleeding through his catheter and pain Pt has a scheduled TURP on the 17th

## 2016-09-01 NOTE — ED Provider Notes (Signed)
[facility] DEPT Provider Note   CSN: [id number] Arrival date & time: 09/01/16  0021  By signing my name below, I, Theresia Bough, attest that this documentation has been prepared under the direction and in the presence of [place], Claudetta Sallie, MD. Electronically Signed: Theresia Bough, ED Scribe. 09/01/16. 1:13 AM.  History   Chief Complaint Chief Complaint  Patient presents with  . catheter pain   The history is provided by the patient. No language interpreter was used.  Hematuria  This is a recurrent problem. The current episode started yesterday. The problem occurs constantly. The problem has not changed since onset.Pertinent negatives include no chest pain and no shortness of breath. Nothing aggravates the symptoms. Nothing relieves the symptoms. He has tried nothing for the symptoms. The treatment provided no relief.   HPI Comments: Chad Russell is a 83 y.o. male who presents to the Emergency Department complaining of recurrent cathter pain onset 7 hours ago. Pt noticed blood in the bag of his foley catheter. No fall or trauma. Pt was seen in the ED 2 days ago with the same problem. Pt is scheduled for a surgery in 6 days. No antibiotic use recently. Pt takes proscar regularly. Pt states his low HR is normal for him since he was young. Pt denies fever or any other complaints at this time.  Past Medical History:  Diagnosis Date  . Arthritis   . Atrial fibrillation ([place])   . Balance problem 10/31/2013  . BPH (benign prostatic hyperplasia) 2014   Urology Dr [name] with [facility] [phone]   . Bradycardia    a. baseline HR in the 30's. Asymptomatic - no history of PPM placement.   Marland Kitchen CAD (coronary artery disease)    LHC 03/16/11: Mid LAD 10-20%, proximal circumflex 10%, mid RCA 20%, EF 55%.  . Cataract   . Complete heart block ([place])   . GERD (gastroesophageal reflux disease)    takes occasional  zantac  . Hx of echocardiogram    Echo 02/2011: EF 65-70%, normal wall motion, MAC,  mild MR, mild LAE, mild RVE  . Hypertension   . OSA (obstructive sleep apnea)   . Talar fracture    casted no surg    Patient Active Problem List   Diagnosis Date Noted  . Preoperative cardiovascular examination 08/24/2016  . Dysuria 05/13/2016  . Urinary retention 05/13/2016  . Nausea without vomiting 05/13/2016  . Pelvic pain 05/13/2016  . Balance problem 10/31/2013  . OSA (obstructive sleep apnea) 10/31/2013  . BPH (benign prostatic hyperplasia) 08/28/2012  . Atrial fibrillation ([place]) 06/08/2012  . Bacteremia 04/28/2012  . Hyperplasia of prostate with lower urinary tract symptoms (LUTS) 04/28/2012  . Calf pain 04/28/2012  . Moderate malnutrition ([place]) 04/28/2012  . Influenza-like illness 04/27/2012  . Infection of urinary tract - recurrent 04/26/2012  . NSTEMI (non-ST elevated myocardial infarction) ([place]) 03/15/2011  . Complete heart block ([place]) 03/15/2011  . HTN (hypertension) 03/15/2011    Past Surgical History:  Procedure Laterality Date  . COLONOSCOPY     Repeated and normal in 2011  . hemmorhoidectomy    . INGUINAL HERNIA REPAIR Right 02/26/2014   Procedure: LAPAROSCOPIC RIGHT INGUINAL HERNIA REPAIR;  Surgeon: Ralene Ok, MD;  Location: [facility];  Service: General;  Laterality: Right;  . INSERTION OF MESH Right 02/26/2014   Procedure: INSERTION OF MESH;  Surgeon: Ralene Ok, MD;  Location: [facility];  Service: General;  Laterality: Right;  . LEFT HEART CATHETERIZATION WITH CORONARY ANGIOGRAM N/A 03/16/2011  Procedure: LEFT HEART CATHETERIZATION WITH CORONARY ANGIOGRAM;  Surgeon: Peter M Martinique, MD;  Location: [facility] CATH LAB;  Service: Cardiovascular;  Laterality: N/A;  . PROSTATE BIOPSY     negative - [facility]       Home Medications    Prior to Admission medications   Medication Sig Start Date End Date Taking? Authorizing Provider  Docusate Sodium (COLACE PO) Take 1 tablet by mouth at bedtime as needed (constipation).     [provider]    finasteride (PROSCAR) 5 MG tablet Take 5 mg by mouth at bedtime.    [provider]  lisinopril-hydrochlorothiazide (PRINZIDE,ZESTORETIC) 10-12.5 MG tablet TAKE 1 TABLET BY MOUTH DAILY. 04/19/16   Minus Breeding, MD  LUTEIN-ZEAXANTHIN PO Take 1 tablet by mouth at bedtime.    [provider]  Multiple Vitamins-Minerals (MULTIVITAMIN PO) Take 1 tablet by mouth daily.    [provider]  Multiple Vitamins-Minerals (PRESERVISION AREDS 2+MULTI VIT PO) Take 1 tablet by mouth 2 (two) times daily.    [provider]  nitrofurantoin, macrocrystal-monohydrate, (MACROBID) 100 MG capsule Take 100 mg by mouth daily. 07/27/16   [provider]  OVER THE COUNTER MEDICATION Take 1 tablet by mouth daily. Super Beta Prostate    [provider]  Probiotic Product (PROBIOTIC DAILY PO) Take 1 tablet by mouth daily.    [provider]  timolol (TIMOPTIC) 0.5 % ophthalmic solution Place 1 drop into the left eye at bedtime.  12/04/15   [provider]  Turmeric (CURCUMIN 95) 500 MG CAPS Take 500 mg by mouth daily.    [provider]  vitamin C (ASCORBIC ACID) 500 MG tablet Take 500 mg by mouth daily.    [provider]  XARELTO 20 MG TABS tablet TAKE ONE TABLET BY MOUTH DAILY WITH SUPPER 08/13/16   Minus Breeding, MD    Family History Family History  Problem Relation Age of Onset  . Hypertension Mother   . Multiple sclerosis Sister     Social History Social History  Substance Use Topics  . Smoking status: Former Smoker    Packs/day: 1.00    Years: 3.00    Types: Cigarettes    Quit date: 03/17/1955  . Smokeless tobacco: Former Systems developer  . Alcohol use 4.2 oz/week    7 Standard drinks or equivalent per week     Comment: daily     Allergies   Doxycycline; Keflex [cephalexin]; and Wheat bran   Review of Systems Review of Systems  Constitutional: Negative for chills and fever.  HENT: Negative for drooling and facial  swelling.   Eyes: Negative for photophobia.  Respiratory: Negative for shortness of breath.   Cardiovascular: Negative for chest pain, palpitations and leg swelling.  Gastrointestinal: Negative for anal bleeding.  Genitourinary: Positive for hematuria. Negative for difficulty urinating.       Pelvic pain  Musculoskeletal: Negative for neck stiffness.  Skin: Negative for pallor.  Neurological: Negative for facial asymmetry and speech difficulty.  Psychiatric/Behavioral: Negative for suicidal ideas.  All other systems reviewed and are negative.    Physical Exam Updated Vital Signs BP (!) 172/87 (BP Location: Left Arm)   Pulse (!) 36   Temp 97.6 F (36.4 C) (Oral)   Resp 20   SpO2 100%   Physical Exam  Constitutional: He appears well-developed and well-nourished.  HENT:  Head: Normocephalic.  Mouth/Throat: Oropharynx is clear and moist. No oropharyngeal exudate.  Eyes: Conjunctivae and EOM are normal. Pupils are equal, round, and  reactive to light. Right eye exhibits no discharge. Left eye exhibits no discharge. No scleral icterus.  Neck: Normal range of motion. Neck supple. No JVD present. No tracheal deviation present.  Trachea is midline. No stridor or carotid bruits.  Cardiovascular: Regular rhythm, normal heart sounds and intact distal pulses.   No murmur heard. Pulmonary/Chest: Effort normal and breath sounds normal. No stridor. No respiratory distress. He has no wheezes. He has no rales.  Lungs CTA bilaterally.  Abdominal: Soft. Bowel sounds are normal. He exhibits no distension. There is no tenderness. There is no rebound and no guarding.  Genitourinary:  Genitourinary Comments: Chronic foley catheter in place. Bladder is enlarged.   Musculoskeletal: Normal range of motion. He exhibits no edema or tenderness.  All compartments are soft. No palpable cords.   Lymphadenopathy:    He has no cervical adenopathy.  Neurological: He is alert. He has normal reflexes. He  displays normal reflexes.  Skin: Skin is warm and dry. Capillary refill takes less than 2 seconds.  Psychiatric: He has a normal mood and affect. His behavior is normal.  Nursing note and vitals reviewed.    ED Treatments / Results  DIAGNOSTIC STUDIES: Oxygen Saturation is 100% on RA, normal by my interpretation.   COORDINATION OF CARE: 12:46 AM-Discussed next steps with pt including catheter replacement. Pt verbalized understanding and is agreeable with the plan.   Labs (all labs ordered are listed, but only abnormal results are displayed)  Results for orders placed or performed during the hospital encounter of 09/01/16  CBC with Differential/Platelet  Result Value Ref Range   WBC 11.1 (H) 4.0 - 10.5 K/uL   RBC 4.08 (L) 4.22 - 5.81 MIL/uL   Hemoglobin 13.3 13.0 - 17.0 g/dL   HCT 39.8 39.0 - 52.0 %   MCV 97.5 78.0 - 100.0 fL   MCH 32.6 26.0 - 34.0 pg   MCHC 33.4 30.0 - 36.0 g/dL   RDW 14.1 11.5 - 15.5 %   Platelets 231 150 - 400 K/uL   Neutrophils Relative % 66 %   Neutro Abs 7.2 1.7 - 7.7 K/uL   Lymphocytes Relative 22 %   Lymphs Abs 2.5 0.7 - 4.0 K/uL   Monocytes Relative 9 %   Monocytes Absolute 1.0 0.1 - 1.0 K/uL   Eosinophils Relative 3 %   Eosinophils Absolute 0.3 0.0 - 0.7 K/uL   Basophils Relative 0 %   Basophils Absolute 0.0 0.0 - 0.1 K/uL  Urinalysis, Routine w reflex microscopic  Result Value Ref Range   Color, Urine YELLOW YELLOW   APPearance CLOUDY (A) CLEAR   Specific Gravity, Urine 1.012 1.005 - 1.030   pH 8.0 5.0 - 8.0   Glucose, UA NEGATIVE NEGATIVE mg/dL   Hgb urine dipstick LARGE (A) NEGATIVE   Bilirubin Urine NEGATIVE NEGATIVE   Ketones, ur NEGATIVE NEGATIVE mg/dL   Protein, ur 100 (A) NEGATIVE mg/dL   Nitrite NEGATIVE NEGATIVE   Leukocytes, UA SMALL (A) NEGATIVE   RBC / HPF TOO NUMEROUS TO COUNT 0 - 5 RBC/hpf   WBC, UA 0-5 0 - 5 WBC/hpf   Bacteria, UA NONE SEEN NONE SEEN   Squamous Epithelial / LPF NONE SEEN NONE SEEN   Mucous PRESENT     Non Squamous Epithelial 0-5 (A) NONE SEEN  I-Stat Chem 8, ED  Result Value Ref Range   Sodium 140 135 - 145 mmol/L   Potassium 4.6 3.5 - 5.1 mmol/L   Chloride 105 101 - 111 mmol/L  BUN 31 (H) 6 - 20 mg/dL   Creatinine, Ser 1.10 0.61 - 1.24 mg/dL   Glucose, Bld 95 65 - 99 mg/dL   Calcium, Ion 1.12 (L) 1.15 - 1.40 mmol/L   TCO2 26 0 - 100 mmol/L   Hemoglobin 13.9 13.0 - 17.0 g/dL   HCT 41.0 39.0 - 52.0 %   No results found.  Procedures Procedures (including critical care time)  Medications Ordered in ED  Medications  fosfomycin (MONUROL) packet 3 g (not administered)  lidocaine (XYLOCAINE) 2 % jelly 1 application (1 application Urethral Given 09/01/16 0130)    Final Clinical Impressions(s) / ED Diagnoses  Follow up with your urologist.  No signs of infection.  Cude placed in the ED with drainage.    Return for fevers, weakness, lack of urine drainage, worsening swelling, streaking up the arm, weakness, inability to tolerate oral medication, worsening pain, altered level of consciousness,bleeding or any concerns.   The patient is nontoxic-appearing on exam and vital signs are within normal limits.   I have reviewed the triage vital signs and the nursing notes. Pertinent labs &imaging results that were available during my care of the patient were reviewed by me and considered in my medical decision making (see chart for details).  After history, exam, and medical workup I feel the patient has been appropriately medically screened and is safe for discharge home. Pertinent diagnoses were discussed with the patient. Patient was given return precautions.    I personally performed the services described in this documentation, which was scribed in my presence. The recorded information has been reviewed and is accurate.    Addilynn Mowrer, MD 09/01/16 [phone]

## 2016-09-01 NOTE — ED Notes (Signed)
Patient with intermittent bladder spasms-irrigated foley for light bloody urine-draining with no further mucous noted.

## 2016-09-01 NOTE — Progress Notes (Signed)
Per patient; he went to [facility] ED yesterday night c/o foley catheter not draining properly and with some red tinged urine noticed in Foley drainage bag. ED physician dx with foley catheter obstruction. CBC diff , CMP, and UA were drawn . WBC slightly elevated. Per ED physician summary note, catheter was replaced with Coude cath and "no signs of infection " was noted. However pat states he was started on anew antibiotic. Him and his wife could not recall the name of the medication, only that it starts with an "M". RN did not see any new abx added to medication, although there was a completed Macrobid rx.  Rn called over to [facility] and spoke directly with Pam to give an FYI about patient being seen in the ED and what was done. Pam states that they are waiting for patient's urine culture to result that was collected on 08-31-16 at [facility] office and will F/U as needed with Dr [name] and the patient . Pam thanked RN for the update.

## 2016-09-07 ENCOUNTER — Ambulatory Visit (HOSPITAL_COMMUNITY): Payer: PPO | Admitting: Anesthesiology

## 2016-09-07 ENCOUNTER — Encounter (HOSPITAL_COMMUNITY): Payer: Self-pay | Admitting: *Deleted

## 2016-09-07 ENCOUNTER — Inpatient Hospital Stay (HOSPITAL_COMMUNITY)
Admission: RE | Admit: 2016-09-07 | Discharge: 2016-09-09 | DRG: 713 | Disposition: A | Discharge status: Home / Self Care | Payer: PPO | Source: Ambulatory Visit | Attending: Urology | Admitting: Urology

## 2016-09-07 ENCOUNTER — Encounter (HOSPITAL_COMMUNITY)
Admission: RE | Disposition: A | Discharge status: Home / Self Care | Payer: Self-pay | Source: Ambulatory Visit | Attending: Urology

## 2016-09-07 DIAGNOSIS — R339 Retention of urine, unspecified: Secondary | ICD-10-CM | POA: Diagnosis present

## 2016-09-07 DIAGNOSIS — Z7901 Long term (current) use of anticoagulants: Secondary | ICD-10-CM | POA: Diagnosis not present

## 2016-09-07 DIAGNOSIS — Z881 Allergy status to other antibiotic agents status: Secondary | ICD-10-CM

## 2016-09-07 DIAGNOSIS — I442 Atrioventricular block, complete: Secondary | ICD-10-CM | POA: Diagnosis present

## 2016-09-07 DIAGNOSIS — R338 Other retention of urine: Secondary | ICD-10-CM | POA: Diagnosis not present

## 2016-09-07 DIAGNOSIS — Z87891 Personal history of nicotine dependence: Secondary | ICD-10-CM | POA: Diagnosis not present

## 2016-09-07 DIAGNOSIS — G4733 Obstructive sleep apnea (adult) (pediatric): Secondary | ICD-10-CM | POA: Diagnosis not present

## 2016-09-07 DIAGNOSIS — I4891 Unspecified atrial fibrillation: Secondary | ICD-10-CM | POA: Diagnosis not present

## 2016-09-07 DIAGNOSIS — N401 Enlarged prostate with lower urinary tract symptoms: Secondary | ICD-10-CM | POA: Diagnosis not present

## 2016-09-07 DIAGNOSIS — N138 Other obstructive and reflux uropathy: Secondary | ICD-10-CM | POA: Diagnosis not present

## 2016-09-07 DIAGNOSIS — D62 Acute posthemorrhagic anemia: Secondary | ICD-10-CM | POA: Diagnosis not present

## 2016-09-07 DIAGNOSIS — K219 Gastro-esophageal reflux disease without esophagitis: Secondary | ICD-10-CM | POA: Diagnosis not present

## 2016-09-07 DIAGNOSIS — Z79899 Other long term (current) drug therapy: Secondary | ICD-10-CM

## 2016-09-07 DIAGNOSIS — N400 Enlarged prostate without lower urinary tract symptoms: Secondary | ICD-10-CM | POA: Diagnosis not present

## 2016-09-07 DIAGNOSIS — I10 Essential (primary) hypertension: Secondary | ICD-10-CM | POA: Diagnosis present

## 2016-09-07 DIAGNOSIS — I2510 Atherosclerotic heart disease of native coronary artery without angina pectoris: Secondary | ICD-10-CM | POA: Diagnosis present

## 2016-09-07 DIAGNOSIS — I214 Non-ST elevation (NSTEMI) myocardial infarction: Secondary | ICD-10-CM | POA: Diagnosis not present

## 2016-09-07 HISTORY — PX: TRANSURETHRAL RESECTION OF PROSTATE: SHX73

## 2016-09-07 SURGERY — TURP (TRANSURETHRAL RESECTION OF PROSTATE)
Anesthesia: General

## 2016-09-07 MED ORDER — FLEET ENEMA 7-19 GM/118ML RE ENEM: 1.0000 | ENEMA | Freq: Once | RECTAL | Status: DC | PRN

## 2016-09-07 MED ORDER — PROSIGHT PO TABS
1.0000 | ORAL_TABLET | Freq: Every day | ORAL | Status: DC
  Administered 2016-09-07 – 2016-09-08 (×2): 1 via ORAL
  Filled 2016-09-07 (×2): qty 1

## 2016-09-07 MED ORDER — PRESERVISION AREDS 2+MULTI VIT PO CAPS: ORAL_CAPSULE | Freq: Two times a day (BID) | ORAL | Status: DC

## 2016-09-07 MED ORDER — BISACODYL 10 MG RE SUPP: 10.0000 mg | Freq: Every day | RECTAL | Status: DC | PRN

## 2016-09-07 MED ORDER — ONDANSETRON HCL 4 MG/2ML IJ SOLN: 4.0000 mg | INTRAMUSCULAR | Status: DC | PRN

## 2016-09-07 MED ORDER — CIPROFLOXACIN IN D5W 400 MG/200ML IV SOLN
400.0000 mg | Freq: Two times a day (BID) | INTRAVENOUS | Status: DC
  Administered 2016-09-07: 400 mg via INTRAVENOUS
  Filled 2016-09-07: qty 200

## 2016-09-07 MED ORDER — FENTANYL CITRATE (PF) 100 MCG/2ML IJ SOLN
INTRAMUSCULAR | Status: AC
  Filled 2016-09-07: qty 2

## 2016-09-07 MED ORDER — POTASSIUM CHLORIDE IN NACL 20-0.45 MEQ/L-% IV SOLN
INTRAVENOUS | Status: DC
  Administered 2016-09-07 – 2016-09-08 (×4): via INTRAVENOUS
  Filled 2016-09-07 (×6): qty 1000

## 2016-09-07 MED ORDER — LIDOCAINE 2% (20 MG/ML) 5 ML SYRINGE
INTRAMUSCULAR | Status: AC
  Filled 2016-09-07: qty 5

## 2016-09-07 MED ORDER — METOCLOPRAMIDE HCL 5 MG/ML IJ SOLN: 10.0000 mg | Freq: Once | INTRAMUSCULAR | Status: DC | PRN

## 2016-09-07 MED ORDER — SENNOSIDES-DOCUSATE SODIUM 8.6-50 MG PO TABS
1.0000 | ORAL_TABLET | Freq: Every evening | ORAL | Status: DC | PRN
  Administered 2016-09-07: 1 via ORAL
  Filled 2016-09-07: qty 1

## 2016-09-07 MED ORDER — MEPERIDINE HCL 50 MG/ML IJ SOLN: 6.2500 mg | INTRAMUSCULAR | Status: DC | PRN

## 2016-09-07 MED ORDER — TIMOLOL MALEATE 0.5 % OP SOLN
1.0000 [drp] | Freq: Every day | OPHTHALMIC | Status: DC
  Administered 2016-09-07 – 2016-09-08 (×2): 1 [drp] via OPHTHALMIC
  Filled 2016-09-07: qty 5

## 2016-09-07 MED ORDER — HYDROCHLOROTHIAZIDE 12.5 MG PO CAPS
12.5000 mg | ORAL_CAPSULE | Freq: Every day | ORAL | Status: DC
  Administered 2016-09-07 – 2016-09-09 (×3): 12.5 mg via ORAL
  Filled 2016-09-07 (×3): qty 1

## 2016-09-07 MED ORDER — DEXAMETHASONE SODIUM PHOSPHATE 10 MG/ML IJ SOLN
INTRAMUSCULAR | Status: AC
  Filled 2016-09-07: qty 1

## 2016-09-07 MED ORDER — LIDOCAINE 2% (20 MG/ML) 5 ML SYRINGE
INTRAMUSCULAR | Status: DC | PRN
  Administered 2016-09-07: 40 mg via INTRAVENOUS

## 2016-09-07 MED ORDER — HYDROCODONE-ACETAMINOPHEN 5-325 MG PO TABS: 1.0000 | ORAL_TABLET | ORAL | Status: DC | PRN

## 2016-09-07 MED ORDER — LACTATED RINGERS IV SOLN
INTRAVENOUS | Status: DC
  Administered 2016-09-07 (×2): via INTRAVENOUS

## 2016-09-07 MED ORDER — LISINOPRIL-HYDROCHLOROTHIAZIDE 10-12.5 MG PO TABS: 1.0000 | ORAL_TABLET | Freq: Every day | ORAL | Status: DC

## 2016-09-07 MED ORDER — LISINOPRIL 10 MG PO TABS
10.0000 mg | ORAL_TABLET | Freq: Every day | ORAL | Status: DC
  Administered 2016-09-07 – 2016-09-09 (×3): 10 mg via ORAL
  Filled 2016-09-07 (×3): qty 1

## 2016-09-07 MED ORDER — ONDANSETRON HCL 4 MG/2ML IJ SOLN
INTRAMUSCULAR | Status: DC | PRN
  Administered 2016-09-07: 4 mg via INTRAVENOUS

## 2016-09-07 MED ORDER — HYDROMORPHONE HCL-NACL 0.5-0.9 MG/ML-% IV SOSY: 0.5000 mg | PREFILLED_SYRINGE | INTRAVENOUS | Status: DC | PRN

## 2016-09-07 MED ORDER — DOCUSATE SODIUM 100 MG PO CAPS
100.0000 mg | ORAL_CAPSULE | Freq: Two times a day (BID) | ORAL | Status: DC
  Administered 2016-09-07 – 2016-09-09 (×4): 100 mg via ORAL
  Filled 2016-09-07 (×4): qty 1

## 2016-09-07 MED ORDER — FINASTERIDE 5 MG PO TABS
5.0000 mg | ORAL_TABLET | Freq: Every day | ORAL | Status: DC
  Administered 2016-09-07 – 2016-09-08 (×2): 5 mg via ORAL
  Filled 2016-09-07 (×2): qty 1

## 2016-09-07 MED ORDER — PROPOFOL 10 MG/ML IV BOLUS
INTRAVENOUS | Status: AC
  Filled 2016-09-07: qty 20

## 2016-09-07 MED ORDER — ONDANSETRON HCL 4 MG/2ML IJ SOLN
INTRAMUSCULAR | Status: AC
  Filled 2016-09-07: qty 2

## 2016-09-07 MED ORDER — FENTANYL CITRATE (PF) 100 MCG/2ML IJ SOLN
INTRAMUSCULAR | Status: DC | PRN
  Administered 2016-09-07 (×2): 25 ug via INTRAVENOUS

## 2016-09-07 MED ORDER — CIPROFLOXACIN HCL 250 MG PO TABS: 250.0000 mg | ORAL_TABLET | Freq: Two times a day (BID) | ORAL | 0 refills | Status: AC

## 2016-09-07 MED ORDER — DEXAMETHASONE SODIUM PHOSPHATE 10 MG/ML IJ SOLN
INTRAMUSCULAR | Status: DC | PRN
  Administered 2016-09-07: 10 mg via INTRAVENOUS

## 2016-09-07 MED ORDER — CIPROFLOXACIN HCL 250 MG PO TABS
250.0000 mg | ORAL_TABLET | Freq: Two times a day (BID) | ORAL | Status: DC
Start: 2016-09-07 — End: 2016-09-09
  Administered 2016-09-07 – 2016-09-09 (×4): 250 mg via ORAL
  Filled 2016-09-07 (×4): qty 1

## 2016-09-07 MED ORDER — SODIUM CHLORIDE 0.9 % IR SOLN
Status: DC | PRN
  Administered 2016-09-07: 30000 mL via INTRAVESICAL

## 2016-09-07 MED ORDER — ACETAMINOPHEN 325 MG PO TABS: 650.0000 mg | ORAL_TABLET | ORAL | Status: DC | PRN

## 2016-09-07 MED ORDER — PROPOFOL 10 MG/ML IV BOLUS
INTRAVENOUS | Status: DC | PRN
  Administered 2016-09-07 (×2): 50 mg via INTRAVENOUS

## 2016-09-07 MED ORDER — FENTANYL CITRATE (PF) 100 MCG/2ML IJ SOLN
25.0000 ug | INTRAMUSCULAR | Status: DC | PRN
  Administered 2016-09-07 (×2): 50 ug via INTRAVENOUS

## 2016-09-07 SURGICAL SUPPLY — 15 items
BAG URINE DRAINAGE (UROLOGICAL SUPPLIES) ×1 IMPLANT
BAG URO CATCHER STRL LF (MISCELLANEOUS) ×2 IMPLANT
CATH FOLEY 3WAY 30CC 22FR (CATHETERS) ×1 IMPLANT
COVER SURGICAL LIGHT HANDLE (MISCELLANEOUS) ×2 IMPLANT
ELECT REM PT RETURN 15FT ADLT (MISCELLANEOUS) ×2 IMPLANT
GLOVE SURG SS PI 8.0 STRL IVOR (GLOVE) IMPLANT
GOWN STRL REUS W/TWL XL LVL3 (GOWN DISPOSABLE) ×2 IMPLANT
HOLDER FOLEY CATH W/STRAP (MISCELLANEOUS) ×1 IMPLANT
LOOP CUT BIPOLAR 24F LRG (ELECTROSURGICAL) ×1 IMPLANT
MANIFOLD NEPTUNE II (INSTRUMENTS) ×2 IMPLANT
PACK CYSTO (CUSTOM PROCEDURE TRAY) ×2 IMPLANT
SET ASPIRATION TUBING (TUBING) ×2 IMPLANT
SYR 30ML LL (SYRINGE) ×1 IMPLANT
SYRINGE IRR TOOMEY STRL 70CC (SYRINGE) ×1 IMPLANT
TUBING CONNECTING 10 (TUBING) ×2 IMPLANT

## 2016-09-07 NOTE — Discharge Instructions (Signed)
Transurethral Resection of the Prostate, Care After °Refer to this sheet in the next few weeks. These instructions provide you with information about caring for yourself after your procedure. Your health care provider may also give you more specific instructions. Your treatment has been planned according to current medical practices, but problems sometimes occur. Call your health care provider if you have any problems or questions after your procedure. °What can I expect after the procedure? °After the procedure, it is common to have: °· Mild pain in your lower abdomen. °· Soreness or mild discomfort in your penis from having the catheter inserted during the procedure. °· A feeling of urgency when you need to urinate. °· A small amount of blood in your urine. You may notice some small blood clots in your urine. These are normal. ° °Follow these instructions at home: °Medicines ° °· Take over-the-counter and prescription medicines only as told by your health care provider. °· Do not drive or operate heavy machinery while taking prescription pain medicine. °· Do not drive for 24 hours if you received a sedative. °· If you were prescribed antibiotic medicine, take it as told by your health care provider. Do not stop taking the antibiotic even if you start to feel better. °Activity °· Return to your normal activities as told by your health care provider. Ask your health care provider what activities are safe for you. °· Do not lift anything that is heavier than 10 lb (4.5 kg) for 3 weeks after your procedure, or as long as told by your health care provider. °· Avoid intense physical activity for as long as told by your health care provider. °· Walk at least one time every day. This helps to prevent blood clots. You may increase your physical activity gradually as you start to feel better. °Lifestyle °· Do not drink alcohol for as long as told by your health care provider. This is especially important if you are taking  prescription pain medicines. °· Do not engage in sexual activity until your health care provider says that you can do this. °General instructions °· Do not take baths, swim, or use a hot tub until your health care provider approves. °· Drink enough fluid to keep your urine clear or pale yellow. °· Urinate as soon as you feel the need to. Do not try to hold your urine for long periods of time. °· If your health care provider approves, you may take a stool softener for 2-3 weeks to prevent you from straining to have a bowel movement. °· Wear compression stockings as told by your health care provider. These stockings help to prevent blood clots and reduce swelling in your legs. °· Keep all follow-up visits as told by your health care provider. This is important. °Contact a health care provider if: °· You have difficulty urinating. °· You have a fever. °· You have pain that gets worse or does not improve with medicine. °· You have blood in your urine that does not go away after 1 week of resting and drinking more fluids. °· You have swelling in your penis or testicles. °Get help right away if: °· You are unable to urinate. °· You are having more blood clots in your urine instead of fewer. °· You have: °? Large blood clots. °? A lot of blood in your urine. °? Pain in your back or lower abdomen. °? Pain or swelling in your legs. °? Chills and you are shaking. °This information is not intended to   replace advice given to you by your health care provider. Make sure you discuss any questions you have with your health care provider. °Document Released: 02/08/2005 Document Revised: 10/12/2015 Document Reviewed: 10/31/2014 °Elsevier Interactive Patient Education © 2017 [employer] ° °

## 2016-09-07 NOTE — Anesthesia Preprocedure Evaluation (Addendum)
Anesthesia Evaluation  Patient identified by MRN, date of birth, ID band Patient awake    Reviewed: Allergy & Precautions, NPO status , Patient's Chart, lab work & pertinent test results  Airway Mallampati: II  TM Distance: >3 FB Neck ROM: Full    Dental no notable dental hx.    Pulmonary sleep apnea , former smoker,    Pulmonary exam normal breath sounds clear to auscultation       Cardiovascular hypertension, Pt. on medications + CAD  Normal cardiovascular exam+ dysrhythmias Atrial Fibrillation  Rhythm:Regular Rate:Normal  Resting HR in 30's d/t Complete Heart Block.  Cardiac Catheterization: 03/16/2011 Coronary dominance:right  Left mainstem: Normal.  Left anterior descending (LAD): This is a large vessel that gives rise to 3 diagonal branches. There are mild irregularities in the mid vessel of approximately 10-20%.  Left circumflex (LCx): Distrust to one large obtuse marginal vessel and then terminates in a smaller marginal branch. There is minor irregularity in the proximal vessel less than 10%.  Right coronary artery (RCA): This is a dominant vessel. There is diffuse irregularity in the mid vessel up to 20%.  Left ventriculography: Left ventricular systolic function is overall normal, LVEF is estimated at 55%, there is hypokinesis of the basal anterior wall. There is no significant mitral insufficiency.  Final Conclusions:  1. Nonobstructive atherosclerotic coronary disease. 2. Good overall left ventricular systolic function with wall motion abnormality involving the basal anterior wall.   Neuro/Psych negative neurological ROS  negative psych ROS   GI/Hepatic negative GI ROS, Neg liver ROS,   Endo/Other  negative endocrine ROS  Renal/GU negative Renal ROS  negative genitourinary   Musculoskeletal negative musculoskeletal ROS (+)   Abdominal   Peds negative pediatric ROS (+)  Hematology negative  hematology ROS (+)   Anesthesia Other Findings   Reproductive/Obstetrics negative OB ROS                            Anesthesia Physical Anesthesia Plan  ASA: III  Anesthesia Plan: General   Post-op Pain Management:    Induction: Intravenous  PONV Risk Score and Plan: 3 and Ondansetron, Dexamethasone, Propofol and Treatment may vary due to age or medical condition  Airway Management Planned: LMA and Oral ETT  Additional Equipment:   Intra-op Plan:   Post-operative Plan: Extubation in OR  Informed Consent: I have reviewed the patients History and Physical, chart, labs and discussed the procedure including the risks, benefits and alternatives for the proposed anesthesia with the patient or authorized representative who has indicated his/her understanding and acceptance.   Dental advisory given  Plan Discussed with: CRNA  Anesthesia Plan Comments: (Pacing pads placed pre-op.)        Anesthesia Quick Evaluation

## 2016-09-07 NOTE — H&P (Signed)
[name] returns today following a prostate [place] and urodynamics earlier this week. He has a 138ml prostate and had a good detrusor contraction but was unable to void. He still has a foley for the retention. He remains on finasteride and macrobid.   On UDS Chad Russell held a max capacity of approx. 300 mls. He expressed his 1st sensation at 183 mls. There was positive instability, but he was able to inhibit leaking. He generated a well sustained voluntary contraction but was unable to get his flow started. Trabeculation and elevation of the bladder base were noted, but no reflux was seen. A 20 fr foley was inserted post UDS.     HPI: Seen in the ER on 3/22 for urinary retention. He was doing well on 3/21. He reports that he had at least 1252ml of urine with the foley. He has had no prior GU surgery but has had a prostate biopsy for an elevated PSA in 2007 and his prostate was 175ml. He had a UTI that required hospitalization about 3-4 years ago. His culture from the ER was Mx Species. He had finasteride added at his ER visit. He had hematuria post decompression and held Xarelto for that reason.    Cystoscopy with Dr Chad Russell on 3/29.   Prostate: Obstructing. Severe hyperplasia. 5cm with bleeding in prostatic urethra. prominent veins.  Bladder: Moderate trabeculation. Erythematous mucosa for catheter irritation. No obvious tumors. No stones. Urine was bloody and cloudy and a specimen was aspirated to send for culture. The procedure was well tolerated and there were no complications. His paraphimosis was reduced.   S He did have a culture on 5/25 that was positive for enterococcus and he was started on macrobid.     ALLERGIES: Doxycycline Hyclate CAPS - Other Reaction, GI upset Erythromycin - Other Reaction, GI upset    MEDICATIONS: Finasteride 5 mg tablet 1 tablet PO Daily  Macrobid 100 mg capsule 1 capsule PO Q HS  Fish Oil  Lisinopril-Hydrochlorothiazide 10-12.5 MG Oral Tablet 0 Oral  Multi  Vita  Multi Vitamin/Minerals TABS Oral  Xarelto 20 MG Oral Tablet Oral     GU PSH: Complex cystometrogram, w/ void pressure and urethral pressure profile studies, any technique - 08/11/2016 Complex Uroflow - 08/11/2016, 07/21/2016 Cystoscopy - 05/20/2016 Emg surf Electrd - 08/11/2016 Inject For cystogram - 08/11/2016 Intrabd voidng Press - 08/11/2016 Prostate Needle Biopsy - about 2007    NON-GU PSH: Hemorrhoidectomy (favorite) - 2014 Hernia Repair, Right    GU PMH: Gross hematuria (Worsening, Chronic), Recommend pt call his cardiologist to see if they want him to remain off Xarleto or resume. Recommend he push fluids to keep bladder flushed. Will begin SMZ-TMP DS 1 po BID for UTI. - 05/25/2016, - 05/20/2016 Urinary Retention, He has a very large prostate with obstruction and retention. The hematuria is probably from overdistention followed by decompression in the face of Xarelto. I am going to have him hold the Xarelto to allow the urine to clear. He failed a voiding trial post cystoscopy so an 18 fr foley was replaced. He will stay on tamsulosin and finasteride and will f/u in 1 month for another voiding trial. - 05/20/2016 BPH w/LUTS, Benign prostatic hyperplasia with urinary obstruction - 2016 Personal Hx Urinary Tract Infections, History of urinary tract infection - 2016, H/O bladder infections, - 2016 Urinary Retention, Unspec, Incomplete bladder emptying - 2016 Elevated PSA, Elevated prostate specific antigen (PSA) - 2015      PMH Notes:  2012-05-29 10:32:13 -  Note: Sinus Arrhythmia   NON-GU PMH: Encounter for general adult medical examination without abnormal findings, Encounter for preventive health examination - June 08, 2014 Personal history of other diseases of the circulatory system, History of hypertension - 06/07/12 Personal history of other diseases of the nervous system and sense organs, History of sleep apnea - 06-07-12 Unspecified atrial fibrillation, Atrial Fibrillation - 07-Jun-2012    FAMILY  HISTORY: Death In The Family Father - Runs In Family Death In The Family Mother - Runs In Family multiple sclerosis - Sister   SOCIAL HISTORY: Marital Status: Married Current Smoking Status: Patient does not smoke anymore. Has not smoked since 04/22/1949.   Tobacco Use Assessment Completed: Used Tobacco in last 30 days? Drinks 2 caffeinated drinks per day.    REVIEW OF SYSTEMS:    GU Review Male:   Patient denies frequent urination, hard to postpone urination, burning/ pain with urination, get up at night to urinate, leakage of urine, stream starts and stops, trouble starting your stream, have to strain to urinate , erection problems, and penile pain.  Gastrointestinal (Upper):   Patient denies nausea, vomiting, and indigestion/ heartburn.  Gastrointestinal (Lower):   Patient denies diarrhea and constipation.  Constitutional:   Patient reports fatigue. Patient denies fever, night sweats, and weight loss.  Skin:   Patient denies skin rash/ lesion and itching.  Eyes:   Patient denies blurred vision and double vision.  Ears/ Nose/ Throat:   Patient denies sore throat and sinus problems.  Hematologic/Lymphatic:   Patient denies swollen glands and easy bruising.  Cardiovascular:   Patient denies leg swelling and chest pains.  Respiratory:   Patient denies cough and shortness of breath.  Endocrine:   Patient denies excessive thirst.  Musculoskeletal:   Patient denies back pain and joint pain.  Neurological:   Patient denies headaches and dizziness.  Psychologic:   Patient denies depression and anxiety.   VITAL SIGNS:      08/13/2016 10:44 AM  Temperature 97.5 F / 36 C   MULTI-SYSTEM PHYSICAL EXAMINATION:    Constitutional: Well-nourished. No physical deformities. Normally developed. Good grooming.  Respiratory: No labored breathing, no use of accessory muscles. CTA  Cardiovascular: Normal temperature, IRR with bradycardia but no murmur.     PAST DATA REVIEWED:  Source Of History:   Patient  Urodynamics Review:   Review Urodynamics Tests   02/05/13  PSA  Total PSA 7.39     04/14/05  Hormones  Testosterone, Total 4.37     PROCEDURES: None   ASSESSMENT:      ICD-10 Details  1 GU:   BPH w/LUTS - N40.1 He had good detrusor function on UDS and a 172ml prostate on [place]. He needs a TURP for relief of obstruction. I reviewd the risks of the procedure in detail and some of the alternative options. I will have him cleared by Dr. Percival Spanish to come off of the Xarelto for the perioperative period. I will stop the tamsulosin but continue the finasteride and macrobid pending the procedure.   2   Urinary Retention - R33.8    PLAN:            Medications Stop Meds: Tamsulosin HCl - 0.4 MG Oral Capsule 0 Oral Daily  Start: 05/29/2012  Discontinue: 08/13/2016  - Reason: He is going to have a TURP.           Schedule Return Visit/Planned Activity: ASAP - Schedule Surgery  Procedure: Unspecified Date - Cystoscopy TURP - 94174 Notes: Next available.  Document Letter(s):  Created for Patient: Clinical Summary

## 2016-09-07 NOTE — Anesthesia Procedure Notes (Signed)
Procedure Name: LMA Insertion Date/Time: 09/07/2016 9:18 AM Performed by: Dione Booze Pre-anesthesia Checklist: Patient identified, Emergency Drugs available, Suction available and Patient being monitored Patient Re-evaluated:Patient Re-evaluated prior to induction Oxygen Delivery Method: Circle system utilized Preoxygenation: Pre-oxygenation with 100% oxygen Induction Type: IV induction LMA: LMA with gastric port inserted LMA Size: 4.0 Placement Confirmation: breath sounds checked- equal and bilateral and positive ETCO2 Tube secured with: Tape

## 2016-09-07 NOTE — Interval H&P Note (Signed)
History and Physical Interval Note:  His foley hasn't drained since MN but he is not uncomfortable.   09/07/2016 10:35 AM  Chad Russell  has presented today for surgery, with the diagnosis of benign prostate hyperplasia  The various methods of treatment have been discussed with the patient and family. After consideration of risks, benefits and other options for treatment, the patient has consented to  Procedure(s): TRANSURETHRAL RESECTION OF THE PROSTATE (TURP) (N/A) as a surgical intervention .  The patient's history has been reviewed, patient examined, no change in status, stable for surgery.  I have reviewed the patient's chart and labs.  Questions were answered to the patient's satisfaction.     [name] J

## 2016-09-07 NOTE — Transfer of Care (Signed)
Immediate Anesthesia Transfer of Care Note  Patient: Chad Russell  Procedure(s) Performed: Procedure(s): TRANSURETHRAL RESECTION OF THE PROSTATE (TURP) (N/A)  Patient Location: PACU  Anesthesia Type:General  Level of Consciousness: awake, alert , oriented and patient cooperative  Airway & Oxygen Therapy: Patient Spontanous Breathing and Patient connected to face mask oxygen  Post-op Assessment: Report given to RN and Post -op Vital signs reviewed and stable  Post vital signs: Reviewed and stable  Last Vitals:  Vitals:   09/07/16 0721  BP: (!) 180/65  Pulse: (!) 36  Resp: 18  Temp: 36.6 C    Last Pain:  Vitals:   09/07/16 0721  TempSrc: Oral      Patients Stated Pain Goal: 4 (82/51/89 8421)  Complications: No apparent anesthesia complications

## 2016-09-07 NOTE — Anesthesia Postprocedure Evaluation (Signed)
Anesthesia Post Note  Patient: Chad Russell  Procedure(s) Performed: Procedure(s) (LRB): TRANSURETHRAL RESECTION OF THE PROSTATE (TURP) (N/A)     Patient location during evaluation: PACU Anesthesia Type: General Level of consciousness: awake and alert Pain management: pain level controlled Vital Signs Assessment: post-procedure vital signs reviewed and stable Respiratory status: spontaneous breathing, nonlabored ventilation, respiratory function stable and patient connected to nasal cannula oxygen Cardiovascular status: blood pressure returned to baseline and stable Postop Assessment: no signs of nausea or vomiting Anesthetic complications: no    Last Vitals:  Vitals:   09/07/16 1138 09/07/16 1155  BP: (!) 150/77 (!) 143/73  Pulse: (!) 32   Resp: 12 14  Temp:  36.4 C    Last Pain:  Vitals:   09/07/16 1200  TempSrc:   PainSc: 0-No pain                 Montez Hageman

## 2016-09-08 DIAGNOSIS — K219 Gastro-esophageal reflux disease without esophagitis: Secondary | ICD-10-CM | POA: Diagnosis present

## 2016-09-08 DIAGNOSIS — I4891 Unspecified atrial fibrillation: Secondary | ICD-10-CM | POA: Diagnosis present

## 2016-09-08 DIAGNOSIS — N401 Enlarged prostate with lower urinary tract symptoms: Secondary | ICD-10-CM | POA: Diagnosis present

## 2016-09-08 DIAGNOSIS — I2510 Atherosclerotic heart disease of native coronary artery without angina pectoris: Secondary | ICD-10-CM | POA: Diagnosis present

## 2016-09-08 DIAGNOSIS — D62 Acute posthemorrhagic anemia: Secondary | ICD-10-CM | POA: Diagnosis not present

## 2016-09-08 DIAGNOSIS — N138 Other obstructive and reflux uropathy: Secondary | ICD-10-CM | POA: Diagnosis present

## 2016-09-08 DIAGNOSIS — Z7901 Long term (current) use of anticoagulants: Secondary | ICD-10-CM | POA: Diagnosis not present

## 2016-09-08 DIAGNOSIS — Z881 Allergy status to other antibiotic agents status: Secondary | ICD-10-CM | POA: Diagnosis not present

## 2016-09-08 DIAGNOSIS — Z79899 Other long term (current) drug therapy: Secondary | ICD-10-CM | POA: Diagnosis not present

## 2016-09-08 DIAGNOSIS — Z87891 Personal history of nicotine dependence: Secondary | ICD-10-CM | POA: Diagnosis not present

## 2016-09-08 DIAGNOSIS — I442 Atrioventricular block, complete: Secondary | ICD-10-CM | POA: Diagnosis present

## 2016-09-08 DIAGNOSIS — R338 Other retention of urine: Secondary | ICD-10-CM | POA: Diagnosis present

## 2016-09-08 DIAGNOSIS — G4733 Obstructive sleep apnea (adult) (pediatric): Secondary | ICD-10-CM | POA: Diagnosis present

## 2016-09-08 DIAGNOSIS — R339 Retention of urine, unspecified: Secondary | ICD-10-CM | POA: Diagnosis present

## 2016-09-08 DIAGNOSIS — I10 Essential (primary) hypertension: Secondary | ICD-10-CM | POA: Diagnosis present

## 2016-09-08 LAB — BASIC METABOLIC PANEL
ANION GAP: 6 (ref 5–15)
BUN: 19 mg/dL (ref 6–20)
CALCIUM: 9 mg/dL (ref 8.9–10.3)
CO2: 27 mmol/L (ref 22–32)
CREATININE: 0.99 mg/dL (ref 0.61–1.24)
Chloride: 106 mmol/L (ref 101–111)
GFR calc Af Amer: >60 mL/min (ref >60)
Glucose, Bld: 102 mg/dL — ABNORMAL HIGH (ref 65–99)
Potassium: 4.6 mmol/L (ref 3.5–5.1)
Sodium: 139 mmol/L (ref 135–145)

## 2016-09-08 LAB — HEMOGLOBIN AND HEMATOCRIT, BLOOD
HEMATOCRIT: 36.2 % — AB (ref 39.0–52.0)
HEMOGLOBIN: 12.2 g/dL — AB (ref 13.0–17.0)

## 2016-09-08 MED ORDER — HYOSCYAMINE SULFATE 0.125 MG SL SUBL
0.1250 mg | SUBLINGUAL_TABLET | Freq: Four times a day (QID) | SUBLINGUAL | Status: AC | PRN
  Administered 2016-09-08: 0.125 mg via SUBLINGUAL
  Filled 2016-09-08: qty 1

## 2016-09-08 MED ORDER — MENTHOL 3 MG MT LOZG
1.0000 | LOZENGE | OROMUCOSAL | Status: DC | PRN
  Filled 2016-09-08: qty 9

## 2016-09-08 MED ORDER — FAMOTIDINE 20 MG PO TABS
20.0000 mg | ORAL_TABLET | Freq: Every day | ORAL | Status: DC
  Administered 2016-09-08 – 2016-09-09 (×2): 20 mg via ORAL
  Filled 2016-09-08 (×2): qty 1

## 2016-09-08 NOTE — Care Management Note (Signed)
Case Management Note  Patient Details  Name: ABUBAKAR CRISPO MRN: [id number] Date of Birth: [birth year]  Subjective/Objective:   83 y/o m admitted w/BPH. From home.                 Action/Plan:d/c plan home.   Expected Discharge Date:                  Expected Discharge Plan:  Home/Self Care  In-House Referral:     Discharge planning Services  CM Consult  Post Acute Care Choice:    Choice offered to:     DME Arranged:    DME Agency:     HH Arranged:    HH Agency:     Status of Service:  In process, will continue to follow  If discussed at Long Length of Stay Meetings, dates discussed:    Additional Comments:  Dessa Phi, RN 09/08/2016, 12:37 PM

## 2016-09-08 NOTE — Progress Notes (Signed)
1 Day Post-Op  Subjective: He is doing well post TURP but had some bleeding last night.  There urine is now clear.   He is without complaints.  His has a mild anemia with a Hgb of 12.2 from 13.9 preop.   ROS:  Review of Systems  All other systems reviewed and are negative.   Anti-infectives: Anti-infectives    Start     Dose/Rate Route Frequency Ordered Stop   09/07/16 2000  ciprofloxacin (CIPRO) tablet 250 mg     250 mg Oral 2 times daily 09/07/16 1207     09/07/16 0654  ciprofloxacin (CIPRO) IVPB 400 mg  Status:  Discontinued     400 mg 200 mL/hr over 60 Minutes Intravenous Every 12 hours 09/07/16 0654 09/07/16 1207   09/07/16 0000  ciprofloxacin (CIPRO) 250 MG tablet     250 mg Oral 2 times daily 09/07/16 1041 09/17/16 2359      Current Facility-Administered Medications  Medication Dose Route Frequency Provider Last Rate Last Dose  . 0.45 % NaCl with KCl 20 mEq / L infusion   Intravenous Continuous Irine Seal, MD 100 mL/hr at 09/08/16 0055    . acetaminophen (TYLENOL) tablet 650 mg  650 mg Oral Q4H PRN Irine Seal, MD      . bisacodyl (DULCOLAX) suppository 10 mg  10 mg Rectal Daily PRN Irine Seal, MD      . ciprofloxacin (CIPRO) tablet 250 mg  250 mg Oral BID Irine Seal, MD   250 mg at 09/07/16 2027  . docusate sodium (COLACE) capsule 100 mg  100 mg Oral BID Irine Seal, MD   100 mg at 09/07/16 2115  . finasteride (PROSCAR) tablet 5 mg  5 mg Oral QHS Irine Seal, MD   5 mg at 09/07/16 2114  . lisinopril (PRINIVIL,ZESTRIL) tablet 10 mg  10 mg Oral Daily Irine Seal, MD   10 mg at 09/07/16 1255   And  . hydrochlorothiazide (MICROZIDE) capsule 12.5 mg  12.5 mg Oral Daily Irine Seal, MD   12.5 mg at 09/07/16 1255  . HYDROcodone-acetaminophen (NORCO/VICODIN) 5-325 MG per tablet 1-2 tablet  1-2 tablet Oral Q4H PRN Irine Seal, MD      . HYDROmorphone (DILAUDID) injection 0.5-1 mg  0.5-1 mg Intravenous Q2H PRN Irine Seal, MD      . lactated ringers infusion   Intravenous  Continuous Effie Berkshire, MD   Stopped at 09/07/16 1255  . menthol-cetylpyridinium (CEPACOL) lozenge 3 mg  1 lozenge Oral PRN Irine Seal, MD      . multivitamin (PROSIGHT) tablet 1 tablet  1 tablet Oral QHS Irine Seal, MD   1 tablet at 09/07/16 2115  . ondansetron (ZOFRAN) injection 4 mg  4 mg Intravenous Q4H PRN Irine Seal, MD      . senna-docusate (Senokot-S) tablet 1 tablet  1 tablet Oral QHS PRN Irine Seal, MD   1 tablet at 09/07/16 2115  . sodium phosphate (FLEET) 7-19 GM/118ML enema 1 enema  1 enema Rectal Once PRN Irine Seal, MD      . timolol (TIMOPTIC) 0.5 % ophthalmic solution 1 drop  1 drop Left Eye Vira Browns, MD   1 drop at 09/07/16 2114     Objective: Vital signs in last 24 hours: Temp:  [97.5 F (36.4 C)-97.9 F (36.6 C)] 97.5 F (36.4 C) (07/18 0522) Pulse Rate:  [30-58] 30 (07/18 0522) Resp:  [12-18] 18 (07/18 0522) BP: (140-180)/(60-77) 140/60 (07/18 0522) SpO2:  [98 %-100 %] 100 % (  07/18 0522) Weight:  [78 kg (172 lb)-79.5 kg (175 lb 4.3 oz)] 79.5 kg (175 lb 4.3 oz) (07/17 1155)  Intake/Output from previous day: 07/17 0701 - 07/18 0700 In: 12719.3 [I.V.:1919.3] Out: 15075 [XLEZV:47159; Blood:300] Intake/Output this shift: Total I/O In: 6500 [I.V.:500; Other:6000] Out: 7775 [Urine:7775]   Physical Exam  Constitutional: He is well-developed, well-nourished, and in no distress.  Cardiovascular: Normal rate and regular rhythm.   Pulmonary/Chest: Effort normal. No respiratory distress.  Vitals reviewed.   Lab Results:   Recent Labs  09/08/16 0447  HGB 12.2*  HCT 36.2*   BMET  Recent Labs  09/08/16 0447  NA 139  K 4.6  CL 106  CO2 27  GLUCOSE 102*  BUN 19  CREATININE 0.99  CALCIUM 9.0   PT/INR No results for input(s): LABPROT, INR in the last 72 hours. ABG No results for input(s): PHART, HCO3 in the last 72 hours.  Invalid input(s): PCO2, PO2  Studies/Results: No results found.   Assessment and Plan: BPH with Retention  now POD1 s/p TURP.  He some bleeding through the night but the urine is now clear.   DC CBI.  I will keep him one more night and give a voiding trial in AM.         LOS: 0 days    Malka So 09/08/2016 [phone] ID: [name], male   DOB: July 27, 1926, 83 y.o.   MRN: [id number]

## 2016-09-08 NOTE — Care Management Obs Status (Signed)
[facility] NOTIFICATION   Patient Details  Name: Chad Russell MRN: [id number] Date of Birth: Jul 05, 1926   Medicare Observation Status Notification Given:  Yes    MahabirJuliann Pulse, RN 09/08/2016, 11:17 AM

## 2016-09-09 MED ORDER — CIPROFLOXACIN HCL 250 MG PO TABS: 250.0000 mg | ORAL_TABLET | Freq: Two times a day (BID) | ORAL | 0 refills | Status: DC

## 2016-09-09 NOTE — Discharge Summary (Signed)
Physician Discharge Summary  Patient ID: Chad Russell MRN: [id number] DOB/AGE: 1926-10-29 83 y.o.  Admit date: 09/07/2016 Discharge date: 09/09/2016  Admission Diagnoses:  Urinary retention  Discharge Diagnoses:  Principal Problem:   Urinary retention Active Problems:   BPH with obstruction/lower urinary tract symptoms   Past Medical History:  Diagnosis Date  . Arthritis   . Atrial fibrillation ([place])   . Balance problem 10/31/2013  . BPH (benign prostatic hyperplasia) 2014   Urology Dr [name] with [facility] [phone]   . Bradycardia    a. baseline HR in the 30's. Asymptomatic - no history of PPM placement.   Marland Kitchen CAD (coronary artery disease)    LHC 03/16/11: Mid LAD 10-20%, proximal circumflex 10%, mid RCA 20%, EF 55%.  . Cataract   . Complete heart block ([place])   . GERD (gastroesophageal reflux disease)    takes occasional  zantac  . Hx of echocardiogram    Echo 02/2011: EF 65-70%, normal wall motion, MAC, mild MR, mild LAE, mild RVE  . Hypertension   . OSA (obstructive sleep apnea)    per patient went to have sleep study done about 2 years ago, bu they never F/U with him about results; but wife reports he still has periods of apnea at night   . Talar fracture    casted no surg    Surgeries: Procedure(s): TRANSURETHRAL RESECTION OF THE PROSTATE (TURP) on 09/07/2016   Consultants (if any):   Discharged Condition: Improved  Hospital Course: Chad Russell is an 83 y.o. male who was admitted 09/07/2016 with a diagnosis of Urinary retention and went to the operating room on 09/07/2016 and underwent the above named procedures.   His  Foley was left until 7/19 because of his advanced age, very large prostate and history of retention.  He voided incontinently after foley removal but had good voided volumes and the urine was clear.   He will be discharged after a PVR check with a condom catheter if the PVR is no too high.   He was given perioperative antibiotics:  Anti-infectives     Start     Dose/Rate Route Frequency Ordered Stop   09/09/16 0000  ciprofloxacin (CIPRO) 250 MG tablet     250 mg Oral 2 times daily 09/09/16 1051     09/07/16 2000  ciprofloxacin (CIPRO) tablet 250 mg     250 mg Oral 2 times daily 09/07/16 1207     09/07/16 0654  ciprofloxacin (CIPRO) IVPB 400 mg  Status:  Discontinued     400 mg 200 mL/hr over 60 Minutes Intravenous Every 12 hours 09/07/16 0654 09/07/16 1207   09/07/16 0000  ciprofloxacin (CIPRO) 250 MG tablet     250 mg Oral 2 times daily 09/07/16 1041 09/17/16 2359    .  He was given sequential compression devices, for DVT prophylaxis.  He benefited maximally from the hospital stay and there were no complications.    Recent vital signs:  Vitals:   09/09/16 0631 09/09/16 0946  BP: 126/67 125/64  Pulse: (!) 35   Resp: 18   Temp: 98.1 F (36.7 C)     Recent laboratory studies:  Lab Results  Component Value Date   HGB 12.2 (L) 09/08/2016   HGB 13.9 09/01/2016   HGB 13.3 09/01/2016   Lab Results  Component Value Date   WBC 11.1 (H) 09/01/2016   PLT 231 09/01/2016   Lab Results  Component Value Date   INR 0.96 03/15/2011  Lab Results  Component Value Date   NA 139 09/08/2016   K 4.6 09/08/2016   CL 106 09/08/2016   CO2 27 09/08/2016   BUN 19 09/08/2016   CREATININE 0.99 09/08/2016   GLUCOSE 102 (H) 09/08/2016    Discharge Medications:   Allergies as of 09/09/2016      Reactions   Doxycycline    Upset stomach   Keflex [cephalexin]    Abdominal disruption      Medication List    TAKE these medications   ciprofloxacin 250 MG tablet Commonly known as:  CIPRO Take 1 tablet (250 mg total) by mouth 2 (two) times daily.   ciprofloxacin 250 MG tablet Commonly known as:  CIPRO Take 1 tablet (250 mg total) by mouth 2 (two) times daily.   COLACE PO Take 1 tablet by mouth at bedtime as needed (constipation).   CURCUMIN 95 500 MG Caps Generic drug:  Turmeric Take 500 mg by mouth daily.   finasteride  5 MG tablet Commonly known as:  PROSCAR Take 5 mg by mouth at bedtime.   lisinopril-hydrochlorothiazide 10-12.5 MG tablet Commonly known as:  PRINZIDE,ZESTORETIC TAKE 1 TABLET BY MOUTH DAILY.   LUTEIN-ZEAXANTHIN PO Take 1 tablet by mouth at bedtime.   MULTIVITAMIN PO Take 1 tablet by mouth daily.   PRESERVISION AREDS 2+MULTI VIT PO Take 1 tablet by mouth 2 (two) times daily.   OVER THE COUNTER MEDICATION Take 1 tablet by mouth daily. Super Beta Prostate   PROBIOTIC DAILY PO Take 1 tablet by mouth daily.   timolol 0.5 % ophthalmic solution Commonly known as:  TIMOPTIC Place 1 drop into the left eye at bedtime.   vitamin C 500 MG tablet Commonly known as:  ASCORBIC ACID Take 500 mg by mouth daily.   XARELTO 20 MG Tabs tablet Generic drug:  rivaroxaban TAKE ONE TABLET BY MOUTH DAILY WITH SUPPER       Diagnostic Studies: No results found.  Disposition: 01-Home or Self Care    Follow-up Information    Jimmey Ralph, NP Follow up on 09/21/2016.   Specialty:  Urology Why:  [address] information: [place] [address] [phone]            Signed: Malka So 09/09/2016, 10:52 AM

## 2016-09-09 NOTE — Care Management Note (Signed)
Case Management Note  Patient Details  Name: [name] MRN: [id number] Date of Birth: 10-22-1926  Subjective/Objective:                    Action/Plan:d/c home.   Expected Discharge Date:  09/09/16               Expected Discharge Plan:  Home/Self Care  In-House Referral:     Discharge planning Services  CM Consult  Post Acute Care Choice:    Choice offered to:     DME Arranged:    DME Agency:     HH Arranged:    HH Agency:     Status of Service:  Completed, signed off  If discussed at [employer] of Stay Meetings, dates discussed:    Additional Comments:  Dessa Phi, RN 09/09/2016, 11:23 AM

## 2016-09-14 NOTE — Op Note (Signed)
NAME:  Chad Russell, Chad Russell                  ACCOUNT NO.:  MEDICAL RECORD NO.:  [id number]  LOCATION:                                 FACILITY:  PHYSICIAN:  Marshall Cork. [name], M.D.    DATE OF BIRTH:  [birth year]  DATE OF PROCEDURE:  09/07/2016 DATE OF DISCHARGE:                              OPERATIVE REPORT   PROCEDURE:  Cystoscopy with transurethral resection of the prostate.  PREOPERATIVE DIAGNOSIS:  Benign prostatic hyperplasia with retention.  POSTOPERATIVE DIAGNOSIS:  Benign prostatic hyperplasia with retention.  SURGEON:  Marshall Cork. [name], M.D.  ANESTHESIA:  General.  SPECIMEN:  TUR chips.  DRAINS:  A 22-French 3-way Foley catheter.  BLOOD LOSS:  150-200 mL estimated.  SPECIMEN:  Prostate chips.  COMPLICATIONS:  None.  INDICATIONS:  [name] is an 83 year old white male with urinary retention.  He has a 139 mL prostate with trilobar hyperplasia and has failed medical therapy.  Urodynamic showed a well-preserved detrusor contraction, and it was felt that a TURP was indicated.  The patient is on finasteride.  FINDINGS AND PROCEDURES:  He was taken to the operating room where general anesthetic was induced.  He was given antibiotics as document in the chart.  He was placed in lithotomy position and fitted with PAS hose.  His perineum and genitalia were prepped with Betadine solution, he was draped in usual sterile fashion.  After a 28-French continuous flow resectoscope sheath was inserted, examination revealed the normal urethra, the external sphincter was intact.  The prostatic urethra was approximately 5-6 cm in length with trilobar hyperplasia with a generous middle lobe.  Examination of the bladder revealed moderate-to-severe trabeculation with marked catheter irritation.  The ureteral orifices were in their normal anatomic position away from the bladder neck.  Once inspection had been performed, resection was initiated.  The middle lobe was resected down the bladder  neck fibers and the floor of the prostate was then resected out to alongside the verumontanum.  The left lobe of the prostate was then resected from bladder neck to apex out to the capsular fibers.  This was followed by the right lobe in a similar fashion.  Once the bulk of the resection had been performed, the chips were evacuated and initial hemostasis was achieved.  I then resected additional apical and anterior tissue and generally widened the cavity.  Once complete resection had been performed, the bladder was evacuated free of chips, hemostasis was assured.  Final inspection revealed intact ureteral orifices, no retained chips, no active bleeding and an intact external sphincter.  Once the scope was removed, pressure on the bladder produced a good stream.  A 23-French Foley catheter was inserted with the aid of catheter guide.  The balloon was filled with 30 mL sterile fluid and hand irrigated with clear return.  The catheter was then placed to continuous irrigation and straight drainage.  There were no complications.     Marshall Cork. [name], M.D.     JJW/MEDQ  D:  09/13/2016  T:  09/13/2016  Job:  [id number]

## 2016-09-17 DIAGNOSIS — H353211 Exudative age-related macular degeneration, right eye, with active choroidal neovascularization: Secondary | ICD-10-CM | POA: Diagnosis not present

## 2016-09-17 DIAGNOSIS — H4052X2 Glaucoma secondary to other eye disorders, left eye, moderate stage: Secondary | ICD-10-CM | POA: Diagnosis not present

## 2016-09-17 DIAGNOSIS — H353113 Nonexudative age-related macular degeneration, right eye, advanced atrophic without subfoveal involvement: Secondary | ICD-10-CM | POA: Diagnosis not present

## 2016-09-17 DIAGNOSIS — H353222 Exudative age-related macular degeneration, left eye, with inactive choroidal neovascularization: Secondary | ICD-10-CM | POA: Diagnosis not present

## 2016-09-17 DIAGNOSIS — H43812 Vitreous degeneration, left eye: Secondary | ICD-10-CM | POA: Diagnosis not present

## 2016-09-20 ENCOUNTER — Other Ambulatory Visit: Payer: Self-pay | Admitting: Pharmacist Clinician (PhC)/ Clinical Pharmacy Specialist

## 2016-09-20 MED ORDER — RIVAROXABAN 20 MG PO TABS: ORAL_TABLET | ORAL | 5 refills | Status: DC

## 2016-09-21 DIAGNOSIS — N401 Enlarged prostate with lower urinary tract symptoms: Secondary | ICD-10-CM | POA: Diagnosis not present

## 2016-09-21 DIAGNOSIS — R351 Nocturia: Secondary | ICD-10-CM | POA: Diagnosis not present

## 2016-10-04 DIAGNOSIS — H353113 Nonexudative age-related macular degeneration, right eye, advanced atrophic without subfoveal involvement: Secondary | ICD-10-CM | POA: Diagnosis not present

## 2016-10-04 DIAGNOSIS — H43812 Vitreous degeneration, left eye: Secondary | ICD-10-CM | POA: Diagnosis not present

## 2016-10-04 DIAGNOSIS — H353222 Exudative age-related macular degeneration, left eye, with inactive choroidal neovascularization: Secondary | ICD-10-CM | POA: Diagnosis not present

## 2016-10-04 DIAGNOSIS — H353211 Exudative age-related macular degeneration, right eye, with active choroidal neovascularization: Secondary | ICD-10-CM | POA: Diagnosis not present

## 2016-10-07 DIAGNOSIS — R310 Gross hematuria: Secondary | ICD-10-CM | POA: Diagnosis not present

## 2016-10-08 DIAGNOSIS — R310 Gross hematuria: Secondary | ICD-10-CM | POA: Diagnosis not present

## 2016-10-09 ENCOUNTER — Encounter (HOSPITAL_COMMUNITY): Payer: Self-pay

## 2016-10-09 ENCOUNTER — Emergency Department (HOSPITAL_COMMUNITY)
Admission: EM | Admit: 2016-10-09 | Discharge: 2016-10-09 | Disposition: A | Discharge status: Home / Self Care | Payer: PPO | Attending: Emergency Medicine | Admitting: Emergency Medicine

## 2016-10-09 DIAGNOSIS — I10 Essential (primary) hypertension: Secondary | ICD-10-CM | POA: Diagnosis not present

## 2016-10-09 DIAGNOSIS — R310 Gross hematuria: Secondary | ICD-10-CM

## 2016-10-09 DIAGNOSIS — Z7901 Long term (current) use of anticoagulants: Secondary | ICD-10-CM | POA: Diagnosis not present

## 2016-10-09 DIAGNOSIS — D649 Anemia, unspecified: Secondary | ICD-10-CM | POA: Diagnosis not present

## 2016-10-09 DIAGNOSIS — I2510 Atherosclerotic heart disease of native coronary artery without angina pectoris: Secondary | ICD-10-CM | POA: Insufficient documentation

## 2016-10-09 DIAGNOSIS — Z79899 Other long term (current) drug therapy: Secondary | ICD-10-CM | POA: Diagnosis not present

## 2016-10-09 DIAGNOSIS — Z87891 Personal history of nicotine dependence: Secondary | ICD-10-CM | POA: Insufficient documentation

## 2016-10-09 DIAGNOSIS — R319 Hematuria, unspecified: Secondary | ICD-10-CM | POA: Diagnosis present

## 2016-10-09 LAB — CBC WITH DIFFERENTIAL/PLATELET
BASOS PCT: 0 %
Basophils Absolute: 0 10*3/uL (ref 0.0–0.1)
EOS ABS: 0 10*3/uL (ref 0.0–0.7)
EOS PCT: 0 %
HCT: 33.3 % — ABNORMAL LOW (ref 39.0–52.0)
Hemoglobin: 10.9 g/dL — ABNORMAL LOW (ref 13.0–17.0)
LYMPHS ABS: 2.5 10*3/uL (ref 0.7–4.0)
Lymphocytes Relative: 25 %
MCH: 32 pg (ref 26.0–34.0)
MCHC: 32.7 g/dL (ref 30.0–36.0)
MCV: 97.7 fL (ref 78.0–100.0)
MONO ABS: 1.1 10*3/uL — AB (ref 0.1–1.0)
MONOS PCT: 11 %
Neutro Abs: 6.5 10*3/uL (ref 1.7–7.7)
Neutrophils Relative %: 64 %
Platelets: 226 10*3/uL (ref 150–400)
RBC: 3.41 MIL/uL — ABNORMAL LOW (ref 4.22–5.81)
RDW: 14.3 % (ref 11.5–15.5)
WBC: 10.2 10*3/uL (ref 4.0–10.5)

## 2016-10-09 LAB — BASIC METABOLIC PANEL
Anion gap: 10 (ref 5–15)
BUN: 24 mg/dL — AB (ref 6–20)
CALCIUM: 9.4 mg/dL (ref 8.9–10.3)
CHLORIDE: 101 mmol/L (ref 101–111)
CO2: 25 mmol/L (ref 22–32)
Creatinine, Ser: 1.37 mg/dL — ABNORMAL HIGH (ref 0.61–1.24)
GFR calc non Af Amer: 44 mL/min — ABNORMAL LOW (ref >60)
GFR, EST AFRICAN AMERICAN: 51 mL/min — AB (ref >60)
Glucose, Bld: 111 mg/dL — ABNORMAL HIGH (ref 65–99)
Potassium: 4.3 mmol/L (ref 3.5–5.1)
SODIUM: 136 mmol/L (ref 135–145)

## 2016-10-09 NOTE — ED Notes (Signed)
Bladder scan results: 498 mL.

## 2016-10-09 NOTE — Discharge Instructions (Signed)
Follow-up with Dr. [name] next week. Stop taking your xarelto while you are having the active bleeding until you are told to resume it.  Return for worsening symptoms

## 2016-10-09 NOTE — ED Triage Notes (Signed)
Patient states he has had light bleeding since having a TURP, but patient states he has had heavy bleeding x 3 days. Patient states he is currently taking Xarelto.

## 2016-10-09 NOTE — ED Provider Notes (Signed)
[facility] DEPT Provider Note   CSN: [id number] Arrival date & time: 10/09/16  1447     History   Chief Complaint Chief Complaint  Patient presents with  . Hematuria    HPI Chad Russell is a 83 y.o. male.  HPI   Patient presents to the emergency room for evaluation of hematuria. Patient had a TURP procedure the end of July.  Patient had been doing well until this last week when he started having bleeding. Patient has been having intermittent spasms feels down in the lower abdomen in the penile region. He's been passing large clots. He went and saw his urologist both Thursday and Friday. He had what sounds like bladder irrigation. Patient  was sent home and returns today because he continues to have urinary bleeding and feels like he is passing clots.  He also feels like he is getting weaker and wants to get his blood checked. Past Medical History:  Diagnosis Date  . Arthritis   . Atrial fibrillation ([place])   . Balance problem 10/31/2013  . BPH (benign prostatic hyperplasia) 2014   Urology Dr [name] with [facility] [phone]   . Bradycardia    a. baseline HR in the 30's. Asymptomatic - no history of PPM placement.   Marland Kitchen CAD (coronary artery disease)    LHC 03/16/11: Mid LAD 10-20%, proximal circumflex 10%, mid RCA 20%, EF 55%.  . Cataract   . Complete heart block ([place])   . GERD (gastroesophageal reflux disease)    takes occasional  zantac  . Hx of echocardiogram    Echo 02/2011: EF 65-70%, normal wall motion, MAC, mild MR, mild LAE, mild RVE  . Hypertension   . OSA (obstructive sleep apnea)    per patient went to have sleep study done about 2 years ago, bu they never F/U with him about results; but wife reports he still has periods of apnea at night   . Talar fracture    casted no surg    Patient Active Problem List   Diagnosis Date Noted  . BPH with obstruction/lower urinary tract symptoms 09/07/2016  . Preoperative cardiovascular examination 08/24/2016  . Dysuria  05/13/2016  . Urinary retention 05/13/2016  . Nausea without vomiting 05/13/2016  . Pelvic pain 05/13/2016  . Balance problem 10/31/2013  . OSA (obstructive sleep apnea) 10/31/2013  . BPH (benign prostatic hyperplasia) 08/28/2012  . Atrial fibrillation ([place]) 06/08/2012  . Bacteremia 04/28/2012  . Hyperplasia of prostate with lower urinary tract symptoms (LUTS) 04/28/2012  . Calf pain 04/28/2012  . Moderate malnutrition ([place]) 04/28/2012  . Influenza-like illness 04/27/2012  . Infection of urinary tract - recurrent 04/26/2012  . NSTEMI (non-ST elevated myocardial infarction) ([place]) 03/15/2011  . Complete heart block ([place]) 03/15/2011  . HTN (hypertension) 03/15/2011    Past Surgical History:  Procedure Laterality Date  . COLONOSCOPY     Repeated and normal in 2011  . hemmorhoidectomy    . INGUINAL HERNIA REPAIR Right 02/26/2014   Procedure: LAPAROSCOPIC RIGHT INGUINAL HERNIA REPAIR;  Surgeon: Ralene Ok, MD;  Location: [facility];  Service: General;  Laterality: Right;  . INSERTION OF MESH Right 02/26/2014   Procedure: INSERTION OF MESH;  Surgeon: Ralene Ok, MD;  Location: [facility];  Service: General;  Laterality: Right;  . LEFT HEART CATHETERIZATION WITH CORONARY ANGIOGRAM N/A 03/16/2011   Procedure: LEFT HEART CATHETERIZATION WITH CORONARY ANGIOGRAM;  Surgeon: Peter M Martinique, MD;  Location: [facility] C[facility] LAB;  Service: Cardiovascular;  Laterality: N/A;  . PROSTATE BIOPSY  negative - [facility]  . TRANSURETHRAL RESECTION OF PROSTATE N/A 09/07/2016   Procedure: TRANSURETHRAL RESECTION OF THE PROSTATE (TURP);  Surgeon: Irine Seal, MD;  Location: [facility];  Service: Urology;  Laterality: N/A;       Home Medications    Prior to Admission medications   Medication Sig Start Date End Date Taking? Authorizing Provider  Docusate Sodium (COLACE PO) Take 1 tablet by mouth at bedtime as needed (constipation).    Yes [provider]  finasteride (PROSCAR) 5 MG tablet Take 5 mg by  mouth daily.    Yes [provider]  lisinopril-hydrochlorothiazide (PRINZIDE,ZESTORETIC) 10-12.5 MG tablet TAKE 1 TABLET BY MOUTH DAILY. 04/19/16  Yes Minus Breeding, MD  LUTEIN-ZEAXANTHIN PO Take 1 tablet by mouth at bedtime.   Yes [provider]  Multiple Vitamins-Minerals (MULTIVITAMIN PO) Take 1 tablet by mouth daily.   Yes [provider]  Multiple Vitamins-Minerals (PRESERVISION AREDS 2+MULTI VIT PO) Take 1 tablet by mouth 2 (two) times daily.   Yes [provider]  POLYETHYLENE GLYCOL 3350 PO Take 17 g by mouth daily.   Yes [provider]  rivaroxaban (XARELTO) 20 MG TABS tablet TAKE ONE TABLET BY MOUTH DAILY WITH SUPPER 09/20/16  Yes Minus Breeding, MD  timolol (TIMOPTIC) 0.5 % ophthalmic solution Place 1 drop into both eyes at bedtime.  12/04/15  Yes [provider]  vitamin C (ASCORBIC ACID) 500 MG tablet Take 500 mg by mouth daily.   Yes [provider]  ciprofloxacin (CIPRO) 250 MG tablet Take 1 tablet (250 mg total) by mouth 2 (two) times daily. 09/09/16   Irine Seal, MD  Probiotic Product (PROBIOTIC DAILY PO) Take 1 tablet by mouth daily.    [provider]  Turmeric (CURCUMIN 95) 500 MG CAPS Take 500 mg by mouth daily.    [provider]    Family History Family History  Problem Relation Age of Onset  . Hypertension Mother   . Multiple sclerosis Sister     Social History Social History  Substance Use Topics  . Smoking status: Former Smoker    Packs/day: 1.00    Years: 3.00    Types: Cigarettes    Quit date: 03/17/1955  . Smokeless tobacco: Former Systems developer  . Alcohol use 8.4 oz/week    7 Cans of beer, 7 Standard drinks or equivalent per week     Comment: daily     Allergies   Doxycycline and Keflex [cephalexin]   Review of Systems Review of Systems  All other systems reviewed and are negative.    Physical Exam Updated Vital Signs BP 128/66   Pulse (!) 36   Temp (!) 97.5 F  (36.4 C) (Oral)   Resp 18   Ht 1.829 m (6')   Wt 77.1 kg (170 lb)   SpO2 100%   BMI 23.06 kg/m   Physical Exam  Constitutional: He appears well-developed and well-nourished. No distress.  HENT:  Head: Normocephalic and atraumatic.  Right Ear: External ear normal.  Left Ear: External ear normal.  Eyes: Conjunctivae are normal. Right eye exhibits no discharge. Left eye exhibits no discharge. No scleral icterus.  Neck: Neck supple. No tracheal deviation present.  Cardiovascular: Normal rate, regular rhythm and intact distal pulses.   Pulmonary/Chest: Effort normal and breath sounds normal. No stridor. No respiratory distress. He has no wheezes. He has no rales.  Abdominal: Soft. Bowel sounds are normal. He exhibits no distension. There is no tenderness. There is no rebound  and no guarding.  Mild ttp suprapubic region  Musculoskeletal: He exhibits no edema or tenderness.  Neurological: He is alert. He has normal strength. No cranial nerve deficit (no facial droop, extraocular movements intact, no slurred speech) or sensory deficit. He exhibits normal muscle tone. He displays no seizure activity. Coordination normal.  Skin: Skin is warm and dry. No rash noted. There is pallor.  Psychiatric: He has a normal mood and affect.  Nursing note and vitals reviewed.    ED Treatments / Results  Labs (all labs ordered are listed, but only abnormal results are displayed) Labs Reviewed  CBC WITH DIFFERENTIAL/PLATELET - Abnormal; Notable for the following:       Result Value   RBC 3.41 (*)    Hemoglobin 10.9 (*)    HCT 33.3 (*)    Monocytes Absolute 1.1 (*)    All other components within normal limits  BASIC METABOLIC PANEL - Abnormal; Notable for the following:    Glucose, Bld 111 (*)    BUN 24 (*)    Creatinine, Ser 1.37 (*)    GFR calc non Af Amer 44 (*)    GFR calc Af Amer 51 (*)    All other components within normal limits  URINALYSIS, ROUTINE W REFLEX MICROSCOPIC    EKG  EKG  Interpretation None       Radiology No results found.  Procedures Procedures (including critical care time)  Medications Ordered in ED Medications - No data to display   Initial Impression / Assessment and Plan / ED Course  I have reviewed the triage vital signs and the nursing notes.  Pertinent labs & imaging results that were available during my care of the patient were reviewed by me and considered in my medical decision making (see chart for details).  Clinical Course as of Oct 10 2147  Sat Oct 09, 2016  1927 500 cc of urine on the bladder scan.  Will insert foley and irrigate  [JK]  2103 Patient's hemoglobin has decreased to 10.9  [JK]  2103 Down from 12.21 month ago. Creatinine was also slightly increased  [JK]  2104 Bladder irrigated.  Large volume irrigation with clearing of the urine.  Large clots were passed.  Will consult with urology  [JK]  2127 Discussed with Dr Tresa Moore.  Will have him hold his eliquis until the bleeding decreased.  Will keep the catheter in place at this point.   Follow up in the office.  [JK]    Clinical Course User Index [JK] Dorie Rank, MD    Patient presented to the emergency room for recurrent hematuria. Patient's laboratory tests are reassuring. He does have an anemia compared to his previous hemoglobin however  he does not require transfusion.  Pt has known bradycardia, he is asymptomatic.    D/w Dr Tresa Moore.  Will have pt hold his xarelto.  Continue catheter.  Follow up with urology.  Pt is comfortable with going home.  Final Clinical Impressions(s) / ED Diagnoses   Final diagnoses:  Gross hematuria  Anemia, unspecified type    New Prescriptions New Prescriptions   No medications on file     Dorie Rank, MD 10/09/16 2149

## 2016-10-10 ENCOUNTER — Emergency Department (HOSPITAL_COMMUNITY)
Admission: EM | Admit: 2016-10-10 | Discharge: 2016-10-10 | Disposition: A | Discharge status: Home / Self Care | Payer: PPO | Attending: Emergency Medicine | Admitting: Emergency Medicine

## 2016-10-10 ENCOUNTER — Encounter (HOSPITAL_COMMUNITY): Payer: Self-pay | Admitting: Emergency Medicine

## 2016-10-10 DIAGNOSIS — R310 Gross hematuria: Secondary | ICD-10-CM | POA: Diagnosis not present

## 2016-10-10 DIAGNOSIS — R000 Tachycardia, unspecified: Secondary | ICD-10-CM | POA: Diagnosis not present

## 2016-10-10 DIAGNOSIS — I2510 Atherosclerotic heart disease of native coronary artery without angina pectoris: Secondary | ICD-10-CM | POA: Insufficient documentation

## 2016-10-10 DIAGNOSIS — R301 Vesical tenesmus: Secondary | ICD-10-CM | POA: Diagnosis present

## 2016-10-10 DIAGNOSIS — R339 Retention of urine, unspecified: Secondary | ICD-10-CM | POA: Diagnosis not present

## 2016-10-10 DIAGNOSIS — I10 Essential (primary) hypertension: Secondary | ICD-10-CM | POA: Diagnosis not present

## 2016-10-10 DIAGNOSIS — Z87891 Personal history of nicotine dependence: Secondary | ICD-10-CM | POA: Insufficient documentation

## 2016-10-10 DIAGNOSIS — D62 Acute posthemorrhagic anemia: Secondary | ICD-10-CM | POA: Diagnosis not present

## 2016-10-10 DIAGNOSIS — Z79899 Other long term (current) drug therapy: Secondary | ICD-10-CM | POA: Insufficient documentation

## 2016-10-10 DIAGNOSIS — D500 Iron deficiency anemia secondary to blood loss (chronic): Secondary | ICD-10-CM | POA: Insufficient documentation

## 2016-10-10 LAB — BASIC METABOLIC PANEL
Anion gap: 8 (ref 5–15)
BUN: 33 mg/dL — AB (ref 6–20)
CALCIUM: 9.4 mg/dL (ref 8.9–10.3)
CO2: 27 mmol/L (ref 22–32)
CREATININE: 1.38 mg/dL — AB (ref 0.61–1.24)
Chloride: 106 mmol/L (ref 101–111)
GFR calc Af Amer: 51 mL/min — ABNORMAL LOW (ref >60)
GFR calc non Af Amer: 44 mL/min — ABNORMAL LOW (ref >60)
GLUCOSE: 121 mg/dL — AB (ref 65–99)
Potassium: 4.5 mmol/L (ref 3.5–5.1)
Sodium: 141 mmol/L (ref 135–145)

## 2016-10-10 LAB — CBC
HEMATOCRIT: 28.1 % — AB (ref 39.0–52.0)
HEMOGLOBIN: 9.5 g/dL — AB (ref 13.0–17.0)
MCH: 32.9 pg (ref 26.0–34.0)
MCHC: 33.8 g/dL (ref 30.0–36.0)
MCV: 97.2 fL (ref 78.0–100.0)
Platelets: 211 10*3/uL (ref 150–400)
RBC: 2.89 MIL/uL — AB (ref 4.22–5.81)
RDW: 14.5 % (ref 11.5–15.5)
WBC: 9.1 10*3/uL (ref 4.0–10.5)

## 2016-10-10 MED ORDER — LIDOCAINE HCL 2 % EX GEL
1.0000 "application " | Freq: Once | CUTANEOUS | Status: AC | PRN
  Administered 2016-10-10: 1 via URETHRAL
  Filled 2016-10-10: qty 11

## 2016-10-10 NOTE — ED Notes (Signed)
Emptied 3 quarts from foley bag. Pt on continuous irrigation. RN aware.

## 2016-10-10 NOTE — Discharge Instructions (Signed)
Follow up with Dr [name].  Call the office tomorrow morning to arrange an appointment time.  Return as needed for worsening symptoms

## 2016-10-10 NOTE — ED Provider Notes (Signed)
[facility] DEPT Provider Note   CSN: [id number] Arrival date & time: 10/10/16  1519     History   Chief Complaint Chief Complaint  Patient presents with  . Foley Catheter Problem  . Urinary Retention    HPI Chad Russell is a 83 y.o. male.  HPI  Patient was seen by me last evening for persistent hematuria. Patient had been having difficulty with hematuria this since his TURP. It had been decreasing until last week or so. Last night when he was seen, a Foley catheter was placed. The patient's bladder was irrigated copiously. I discussed the case with Dr. Tresa Moore who was on call for urology. Patient was discharged home with his Foley catheter in place.  I had him stop taking his Xarelto that he was taking for atrial fibrillation. Patient states he was doing well until early this morning when he started having more bladder spasms. He also noticed that he was having bloody urine draining from around the Foley catheter. He continues to have some urine output in the Foley catheter bag but he does not feel like the catheter is completely draining properly. He denies any fevers or chills no other complaints.  Note: Patient states he has a history of bradycardia. This is not new for him and is not having any symptoms associated with this Past Medical History:  Diagnosis Date  . Arthritis   . Atrial fibrillation ([place])   . Balance problem 10/31/2013  . BPH (benign prostatic hyperplasia) 2014   Urology Dr [name] with [facility] [phone]   . Bradycardia    a. baseline HR in the 30's. Asymptomatic - no history of PPM placement.   Marland Kitchen CAD (coronary artery disease)    LHC 03/16/11: Mid LAD 10-20%, proximal circumflex 10%, mid RCA 20%, EF 55%.  . Cataract   . Complete heart block ([place])   . GERD (gastroesophageal reflux disease)    takes occasional  zantac  . Hx of echocardiogram    Echo 02/2011: EF 65-70%, normal wall motion, MAC, mild MR, mild LAE, mild RVE  . Hypertension   . OSA (obstructive  sleep apnea)    per patient went to have sleep study done about 2 years ago, bu they never F/U with him about results; but wife reports he still has periods of apnea at night   . Talar fracture    casted no surg    Patient Active Problem List   Diagnosis Date Noted  . BPH with obstruction/lower urinary tract symptoms 09/07/2016  . Preoperative cardiovascular examination 08/24/2016  . Dysuria 05/13/2016  . Urinary retention 05/13/2016  . Nausea without vomiting 05/13/2016  . Pelvic pain 05/13/2016  . Balance problem 10/31/2013  . OSA (obstructive sleep apnea) 10/31/2013  . BPH (benign prostatic hyperplasia) 08/28/2012  . Atrial fibrillation ([place]) 06/08/2012  . Bacteremia 04/28/2012  . Hyperplasia of prostate with lower urinary tract symptoms (LUTS) 04/28/2012  . Calf pain 04/28/2012  . Moderate malnutrition ([place]) 04/28/2012  . Influenza-like illness 04/27/2012  . Infection of urinary tract - recurrent 04/26/2012  . NSTEMI (non-ST elevated myocardial infarction) ([place]) 03/15/2011  . Complete heart block ([place]) 03/15/2011  . HTN (hypertension) 03/15/2011    Past Surgical History:  Procedure Laterality Date  . COLONOSCOPY     Repeated and normal in 2011  . hemmorhoidectomy    . INGUINAL HERNIA REPAIR Right 02/26/2014   Procedure: LAPAROSCOPIC RIGHT INGUINAL HERNIA REPAIR;  Surgeon: Ralene Ok, MD;  Location: [facility];  Service: General;  Laterality: Right;  . INSERTION OF MESH Right 02/26/2014   Procedure: INSERTION OF MESH;  Surgeon: Ralene Ok, MD;  Location: [facility];  Service: General;  Laterality: Right;  . LEFT HEART CATHETERIZATION WITH CORONARY ANGIOGRAM N/A 03/16/2011   Procedure: LEFT HEART CATHETERIZATION WITH CORONARY ANGIOGRAM;  Surgeon: Peter M Martinique, MD;  Location: [facility] CATH LAB;  Service: Cardiovascular;  Laterality: N/A;  . PROSTATE BIOPSY     negative - [facility]  . TRANSURETHRAL RESECTION OF PROSTATE N/A 09/07/2016   Procedure: TRANSURETHRAL RESECTION OF  THE PROSTATE (TURP);  Surgeon: Irine Seal, MD;  Location: [facility];  Service: Urology;  Laterality: N/A;       Home Medications    Prior to Admission medications   Medication Sig Start Date End Date Taking? Authorizing Provider  ciprofloxacin (CIPRO) 250 MG tablet Take 1 tablet (250 mg total) by mouth 2 (two) times daily. 09/09/16   Irine Seal, MD  Docusate Sodium (COLACE PO) Take 1 tablet by mouth at bedtime as needed (constipation).     [provider]  finasteride (PROSCAR) 5 MG tablet Take 5 mg by mouth daily.     [provider]  lisinopril-hydrochlorothiazide (PRINZIDE,ZESTORETIC) 10-12.5 MG tablet TAKE 1 TABLET BY MOUTH DAILY. 04/19/16   Minus Breeding, MD  LUTEIN-ZEAXANTHIN PO Take 1 tablet by mouth at bedtime.    [provider]  Multiple Vitamins-Minerals (MULTIVITAMIN PO) Take 1 tablet by mouth daily.    [provider]  Multiple Vitamins-Minerals (PRESERVISION AREDS 2+MULTI VIT PO) Take 1 tablet by mouth 2 (two) times daily.    [provider]  POLYETHYLENE GLYCOL 3350 PO Take 17 g by mouth daily.    [provider]  Probiotic Product (PROBIOTIC DAILY PO) Take 1 tablet by mouth daily.    [provider]  timolol (TIMOPTIC) 0.5 % ophthalmic solution Place 1 drop into both eyes at bedtime.  12/04/15   [provider]  Turmeric (CURCUMIN 95) 500 MG CAPS Take 500 mg by mouth daily.    [provider]  vitamin C (ASCORBIC ACID) 500 MG tablet Take 500 mg by mouth daily.    [provider]    Family History Family History  Problem Relation Age of Onset  . Hypertension Mother   . Multiple sclerosis Sister     Social History Social History  Substance Use Topics  . Smoking status: Former Smoker    Packs/day: 1.00    Years: 3.00    Types: Cigarettes    Quit date: 03/17/1955  . Smokeless tobacco: Former Systems developer  . Alcohol use 8.4 oz/week    7 Cans of beer, 7 Standard drinks or equivalent per  week     Comment: daily     Allergies   Doxycycline and Keflex [cephalexin]   Review of Systems Review of Systems  Constitutional: Negative for fever.  Genitourinary: Positive for hematuria.  All other systems reviewed and are negative.    Physical Exam Updated Vital Signs BP (!) 144/66 (BP Location: Left Arm)   Pulse 66   Temp 98.1 F (36.7 C) (Oral)   Resp 18   Ht 1.829 m (6')   Wt 77.1 kg (170 lb)   SpO2 94%   BMI 23.06 kg/m   Physical Exam  Constitutional: No distress.  HENT:  Head: Normocephalic and atraumatic.  Right Ear: External ear normal.  Left Ear: External ear normal.  Eyes: Conjunctivae are normal. Right eye exhibits no discharge. Left eye exhibits no discharge. No  scleral icterus.  Neck: Neck supple. No tracheal deviation present.  Cardiovascular: Intact distal pulses.  An irregular rhythm present. Tachycardia present.   Pulmonary/Chest: Effort normal and breath sounds normal. No stridor. No respiratory distress. He has no wheezes. He has no rales.  Abdominal: Soft. Bowel sounds are normal. He exhibits no distension. There is no tenderness. There is no rebound and no guarding.  Genitourinary:  Genitourinary Comments: Foley catheter in place, draining bloody urine, blood around the catheter in his underwear  Musculoskeletal: He exhibits no edema or tenderness.  Neurological: He is alert. He has normal strength. No cranial nerve deficit (no facial droop, extraocular movements intact, no slurred speech) or sensory deficit. He exhibits normal muscle tone. He displays no seizure activity. Coordination normal.  Skin: Skin is warm and dry. No rash noted.  Psychiatric: He has a normal mood and affect.  Nursing note and vitals reviewed.    ED Treatments / Results  Labs (all labs ordered are listed, but only abnormal results are displayed) Labs Reviewed  CBC - Abnormal; Notable for the following:       Result Value   RBC 2.89 (*)    Hemoglobin 9.5 (*)     HCT 28.1 (*)    All other components within normal limits  BASIC METABOLIC PANEL - Abnormal; Notable for the following:    Glucose, Bld 121 (*)    BUN 33 (*)    Creatinine, Ser 1.38 (*)    GFR calc non Af Amer 44 (*)    GFR calc Af Amer 51 (*)    All other components within normal limits    Radiology No results found.  Procedures Procedures (including critical care time)  Medications Ordered in ED Medications  lidocaine (XYLOCAINE) 2 % jelly 1 application (1 application Urethral Given 10/10/16 1745)     Initial Impression / Assessment and Plan / ED Course  I have reviewed the triage vital signs and the nursing notes.  Pertinent labs & imaging results that were available during my care of the patient were reviewed by me and considered in my medical decision making (see chart for details).  Clinical Course as of Oct 11 2022  Nancy Fetter Oct 10, 2016  1842 Discussed with Dr Tresa Moore.  Pt was offered admission to have a surgical procedure, ablation vs dc home with close follow up ( he did stop the xarelto as of yesterday so the bleeding may stop).  Pt would prefer to go home.  [JK]    Clinical Course User Index [JK] Dorie Rank, MD    Pt presented with persistent hematuria.  I saw him last night as well.  His catheter was irrigated copiously.  No more clots noted.Labs shows that his hemoglobin is trending down but no indication for transfusion at this time . Pt offered admission vs discharge with close follow up.  Pt would prefer to go home.  He will call the urology office tomorrow  Final Clinical Impressions(s) / ED Diagnoses   Final diagnoses:  Gross hematuria  Acute blood loss anemia    New Prescriptions New Prescriptions   No medications on file     Dorie Rank, MD 10/10/16 2024

## 2016-10-10 NOTE — ED Triage Notes (Signed)
Pt comes in after being seen yesterday and having a foley catheter placed.  Pt states since then he has had spasms and some retention and believes it is partially obstructed by a blood clot.  Pt has not had a chance to speak to his urologist yet.  Blood noted in foley bag. Pt states he was taken off of his Xarelto yesterday.

## 2016-10-11 ENCOUNTER — Emergency Department (HOSPITAL_COMMUNITY)
Admission: EM | Admit: 2016-10-11 | Discharge: 2016-10-11 | Disposition: A | Discharge status: Home / Self Care | Payer: PPO | Attending: Emergency Medicine | Admitting: Emergency Medicine

## 2016-10-11 ENCOUNTER — Encounter (HOSPITAL_COMMUNITY): Payer: Self-pay | Admitting: Emergency Medicine

## 2016-10-11 DIAGNOSIS — Z79899 Other long term (current) drug therapy: Secondary | ICD-10-CM | POA: Diagnosis not present

## 2016-10-11 DIAGNOSIS — Z87891 Personal history of nicotine dependence: Secondary | ICD-10-CM | POA: Insufficient documentation

## 2016-10-11 DIAGNOSIS — R301 Vesical tenesmus: Secondary | ICD-10-CM | POA: Diagnosis not present

## 2016-10-11 DIAGNOSIS — R319 Hematuria, unspecified: Secondary | ICD-10-CM | POA: Insufficient documentation

## 2016-10-11 DIAGNOSIS — I10 Essential (primary) hypertension: Secondary | ICD-10-CM | POA: Insufficient documentation

## 2016-10-11 DIAGNOSIS — T83098A Other mechanical complication of other indwelling urethral catheter, initial encounter: Secondary | ICD-10-CM | POA: Diagnosis not present

## 2016-10-11 DIAGNOSIS — N401 Enlarged prostate with lower urinary tract symptoms: Secondary | ICD-10-CM | POA: Diagnosis not present

## 2016-10-11 DIAGNOSIS — I259 Chronic ischemic heart disease, unspecified: Secondary | ICD-10-CM | POA: Diagnosis not present

## 2016-10-11 DIAGNOSIS — R310 Gross hematuria: Secondary | ICD-10-CM | POA: Diagnosis not present

## 2016-10-11 DIAGNOSIS — Z436 Encounter for attention to other artificial openings of urinary tract: Secondary | ICD-10-CM | POA: Diagnosis present

## 2016-10-11 DIAGNOSIS — R338 Other retention of urine: Secondary | ICD-10-CM | POA: Diagnosis not present

## 2016-10-11 DIAGNOSIS — T839XXA Unspecified complication of genitourinary prosthetic device, implant and graft, initial encounter: Secondary | ICD-10-CM

## 2016-10-11 LAB — I-STAT CHEM 8, ED
BUN: 33 mg/dL — AB (ref 6–20)
CALCIUM ION: 1.19 mmol/L (ref 1.15–1.40)
CHLORIDE: 101 mmol/L (ref 101–111)
CREATININE: 1.3 mg/dL — AB (ref 0.61–1.24)
Glucose, Bld: 122 mg/dL — ABNORMAL HIGH (ref 65–99)
HEMATOCRIT: 27 % — AB (ref 39.0–52.0)
Hemoglobin: 9.2 g/dL — ABNORMAL LOW (ref 13.0–17.0)
Potassium: 3.9 mmol/L (ref 3.5–5.1)
Sodium: 139 mmol/L (ref 135–145)
TCO2: 27 mmol/L (ref 0–100)

## 2016-10-11 NOTE — ED Triage Notes (Signed)
Pt was seen earlier this evening for a possible obstructed catheter.  Foley was changed and catheter was irrigated with 3 bags of fluid. Pt reports he is still having spasms in his bladder and there is no urine output in bag. Pt was given option for admission and chose to go home.  Returns to be admitted.

## 2016-10-11 NOTE — ED Notes (Signed)
Patient catheter was flushed with sterile water until there were no more blood clots. Patient states that he does not feel spasms anymore. Patient had a few blood clots in his return of fluids coming out.

## 2016-10-11 NOTE — ED Provider Notes (Signed)
[facility] DEPT Provider Note   CSN: [id number] Arrival date & time: 10/11/16  0215     History   Chief Complaint Chief Complaint  Patient presents with  . Foley Catheter Problem  . Spasms    HPI Chad Russell is a 83 y.o. male.  The history is provided by the patient.  Hematuria  This is a chronic problem. The current episode started more than 1 week ago. The problem has not changed since onset.Pertinent negatives include no chest pain, no abdominal pain and no shortness of breath. Nothing aggravates the symptoms. Nothing relieves the symptoms. He has tried nothing for the symptoms. The treatment provided no relief.  Clots obstructing foley, returned for same.  Flushed and is feeling improved.    Past Medical History:  Diagnosis Date  . Arthritis   . Atrial fibrillation ([place])   . Balance problem 10/31/2013  . BPH (benign prostatic hyperplasia) 2014   Urology Dr [name] with [facility] [phone]   . Bradycardia    a. baseline HR in the 30's. Asymptomatic - no history of PPM placement.   Marland Kitchen CAD (coronary artery disease)    LHC 03/16/11: Mid LAD 10-20%, proximal circumflex 10%, mid RCA 20%, EF 55%.  . Cataract   . Complete heart block ([place])   . GERD (gastroesophageal reflux disease)    takes occasional  zantac  . Hx of echocardiogram    Echo 02/2011: EF 65-70%, normal wall motion, MAC, mild MR, mild LAE, mild RVE  . Hypertension   . OSA (obstructive sleep apnea)    per patient went to have sleep study done about 2 years ago, bu they never F/U with him about results; but wife reports he still has periods of apnea at night   . Talar fracture    casted no surg    Patient Active Problem List   Diagnosis Date Noted  . BPH with obstruction/lower urinary tract symptoms 09/07/2016  . Preoperative cardiovascular examination 08/24/2016  . Dysuria 05/13/2016  . Urinary retention 05/13/2016  . Nausea without vomiting 05/13/2016  . Pelvic pain 05/13/2016  . Balance problem  10/31/2013  . OSA (obstructive sleep apnea) 10/31/2013  . BPH (benign prostatic hyperplasia) 08/28/2012  . Atrial fibrillation ([place]) 06/08/2012  . Bacteremia 04/28/2012  . Hyperplasia of prostate with lower urinary tract symptoms (LUTS) 04/28/2012  . Calf pain 04/28/2012  . Moderate malnutrition ([place]) 04/28/2012  . Influenza-like illness 04/27/2012  . Infection of urinary tract - recurrent 04/26/2012  . NSTEMI (non-ST elevated myocardial infarction) ([place]) 03/15/2011  . Complete heart block ([place]) 03/15/2011  . HTN (hypertension) 03/15/2011    Past Surgical History:  Procedure Laterality Date  . COLONOSCOPY     Repeated and normal in 2011  . hemmorhoidectomy    . INGUINAL HERNIA REPAIR Right 02/26/2014   Procedure: LAPAROSCOPIC RIGHT INGUINAL HERNIA REPAIR;  Surgeon: Ralene Ok, MD;  Location: [facility];  Service: General;  Laterality: Right;  . INSERTION OF MESH Right 02/26/2014   Procedure: INSERTION OF MESH;  Surgeon: Ralene Ok, MD;  Location: [facility];  Service: General;  Laterality: Right;  . LEFT HEART CATHETERIZATION WITH CORONARY ANGIOGRAM N/A 03/16/2011   Procedure: LEFT HEART CATHETERIZATION WITH CORONARY ANGIOGRAM;  Surgeon: Peter M Martinique, MD;  Location: [facility] CATH LAB;  Service: Cardiovascular;  Laterality: N/A;  . PROSTATE BIOPSY     negative - [facility]  . TRANSURETHRAL RESECTION OF PROSTATE N/A 09/07/2016   Procedure: TRANSURETHRAL RESECTION OF THE PROSTATE (TURP);  Surgeon: [name],  Jenny Reichmann, MD;  Location: [facility];  Service: Urology;  Laterality: N/A;       Home Medications    Prior to Admission medications   Medication Sig Start Date End Date Taking? Authorizing Provider  ciprofloxacin (CIPRO) 250 MG tablet Take 1 tablet (250 mg total) by mouth 2 (two) times daily. 09/09/16   Irine Seal, MD  Docusate Sodium (COLACE PO) Take 1 tablet by mouth at bedtime as needed (constipation).     [provider]  finasteride (PROSCAR) 5 MG tablet Take 5 mg by mouth  daily.     [provider]  lisinopril-hydrochlorothiazide (PRINZIDE,ZESTORETIC) 10-12.5 MG tablet TAKE 1 TABLET BY MOUTH DAILY. 04/19/16   Minus Breeding, MD  LUTEIN-ZEAXANTHIN PO Take 1 tablet by mouth at bedtime.    [provider]  Multiple Vitamins-Minerals (MULTIVITAMIN PO) Take 1 tablet by mouth daily.    [provider]  Multiple Vitamins-Minerals (PRESERVISION AREDS 2+MULTI VIT PO) Take 1 tablet by mouth 2 (two) times daily.    [provider]  POLYETHYLENE GLYCOL 3350 PO Take 17 g by mouth daily.    [provider]  Probiotic Product (PROBIOTIC DAILY PO) Take 1 tablet by mouth daily.    [provider]  timolol (TIMOPTIC) 0.5 % ophthalmic solution Place 1 drop into both eyes at bedtime.  12/04/15   [provider]  Turmeric (CURCUMIN 95) 500 MG CAPS Take 500 mg by mouth daily.    [provider]  vitamin C (ASCORBIC ACID) 500 MG tablet Take 500 mg by mouth daily.    [provider]    Family History Family History  Problem Relation Age of Onset  . Hypertension Mother   . Multiple sclerosis Sister     Social History Social History  Substance Use Topics  . Smoking status: Former Smoker    Packs/day: 1.00    Years: 3.00    Types: Cigarettes    Quit date: 03/17/1955  . Smokeless tobacco: Former Systems developer  . Alcohol use 8.4 oz/week    7 Cans of beer, 7 Standard drinks or equivalent per week     Comment: daily     Allergies   Doxycycline and Keflex [cephalexin]   Review of Systems Review of Systems  Constitutional: Negative for fever.  Respiratory: Negative for shortness of breath.   Cardiovascular: Negative for chest pain.  Gastrointestinal: Negative for abdominal pain.  Genitourinary: Positive for hematuria. Negative for difficulty urinating, dysuria and frequency.  All other systems reviewed and are negative.    Physical Exam Updated Vital Signs BP (!) 144/58 (BP Location: Left Arm)    Pulse (!) 35   Temp (!) 97.4 F (36.3 C) (Oral)   Resp 16   SpO2 100%   Physical Exam  Constitutional: He is oriented to person, place, and time. He appears well-developed and well-nourished. No distress.  HENT:  Head: Normocephalic and atraumatic.  Mouth/Throat: No oropharyngeal exudate.  Eyes: Pupils are equal, round, and reactive to light. Conjunctivae are normal.  Neck: Normal range of motion. Neck supple.  Cardiovascular: Normal rate, regular rhythm, normal heart sounds and intact distal pulses.   Pulmonary/Chest: Effort normal and breath sounds normal. He has no wheezes. He has no rales.  Abdominal: Soft. Bowel sounds are normal. He exhibits no mass. There is no tenderness. There is no rebound and no guarding.  Musculoskeletal: Normal range of motion.  Neurological: He is alert and oriented to person, place, and time.  Skin: Skin is warm and  dry. Capillary refill takes less than 2 seconds.  Psychiatric: He has a normal mood and affect.     ED Treatments / Results  Labs (all labs ordered are listed, but only abnormal results are displayed)  Results for orders placed or performed during the hospital encounter of 10/11/16  I-Stat Chem 8, ED  Result Value Ref Range   Sodium 139 135 - 145 mmol/L   Potassium 3.9 3.5 - 5.1 mmol/L   Chloride 101 101 - 111 mmol/L   BUN 33 (H) 6 - 20 mg/dL   Creatinine, Ser 1.30 (H) 0.61 - 1.24 mg/dL   Glucose, Bld 122 (H) 65 - 99 mg/dL   Calcium, Ion 1.19 1.15 - 1.40 mmol/L   TCO2 27 0 - 100 mmol/L   Hemoglobin 9.2 (L) 13.0 - 17.0 g/dL   HCT 27.0 (L) 39.0 - 52.0 %   No results found.   Procedures Procedures (including critical care time)    Final Clinical Impressions(s) / ED Diagnoses   Final diagnoses:  Problem with Foley catheter, initial encounter ([place])  foley draining urine is still bloody but not pure blood.   The patient is very well appearing and has been observed in the ED.  Strict return precautions given for  intractable  rash, swelling or the lips tongue or floor of the mouth, chest pain, dyspnea on exertion, wheezing, weakness, vomiting, diarrhea,  Inability to tolerate liquids or food, fevers > 101, rashes on the skin, altered mental status or any concerns. No signs of systemic illness or infection. The patient is nontoxic-appearing on exam and vital signs are within normal limits.    I have reviewed the triage vital signs and the nursing notes. Pertinent labs &imaging results that were available during my care of the patient were reviewed by me and considered in my medical decision making (see chart for details).  After history, exam, and medical workup I feel the patient has been appropriately medically screened and is safe for discharge home. Pertinent diagnoses were discussed with the patient. Patient was given return precautions.    Waldon Sheerin, MD 10/11/16 [phone]

## 2016-10-12 ENCOUNTER — Other Ambulatory Visit: Payer: Self-pay

## 2016-10-12 DIAGNOSIS — R310 Gross hematuria: Secondary | ICD-10-CM | POA: Diagnosis not present

## 2016-10-12 NOTE — Patient Outreach (Signed)
[facility]) Care Management  10/12/2016  Chad Russell 07/07/1926 [id number]   Telephone call to patient for ED Utilization screening.  No answer.  HIPAA compliant voice message left.   Plan: RN Health Coach will attempt patient again in the next ten business days.   [name], RN, MSN [place] [phone]

## 2016-10-13 ENCOUNTER — Other Ambulatory Visit: Payer: Self-pay

## 2016-10-13 NOTE — Patient Outreach (Signed)
[facility]) Care Management  10/13/2016  Chad Russell December 10, 1926 [id number]   2nd telephone call to patient for ED utilization screening.  No answer.  HIPAA compliant voice message left.    Plan: RN Health Coach will attempt patient again in the next 10 business days.    [name], RN, MSN [place] [phone]

## 2016-10-18 ENCOUNTER — Other Ambulatory Visit: Payer: Self-pay

## 2016-10-18 NOTE — Patient Outreach (Signed)
[facility]) Care Management  10/18/2016  JARAY BOLIVER [birth year] [id number]   3rd telephone call to patient for ED Utilization screening.  No answer.  HIPAA complaint voice message left.    Plan: RN Health Coach will send letter to attempt outreach.  If no response within ten business days will proceed with case closure.    [name], RN, MSN [place] [phone]

## 2016-10-29 DIAGNOSIS — H2513 Age-related nuclear cataract, bilateral: Secondary | ICD-10-CM | POA: Diagnosis not present

## 2016-10-29 DIAGNOSIS — H353113 Nonexudative age-related macular degeneration, right eye, advanced atrophic without subfoveal involvement: Secondary | ICD-10-CM | POA: Diagnosis not present

## 2016-10-29 DIAGNOSIS — H353211 Exudative age-related macular degeneration, right eye, with active choroidal neovascularization: Secondary | ICD-10-CM | POA: Diagnosis not present

## 2016-10-29 DIAGNOSIS — H43812 Vitreous degeneration, left eye: Secondary | ICD-10-CM | POA: Diagnosis not present

## 2016-11-03 ENCOUNTER — Other Ambulatory Visit: Payer: Self-pay

## 2016-11-03 NOTE — Patient Outreach (Signed)
[facility]) Care Management  11/03/2016  Chad Russell 1926-03-12 [id number]   Patient has not responded to calls or letter. Will proceed with case closure.  Will notify care management assistant of case status.     [name], RN, MSN [place] [phone]

## 2016-11-04 DIAGNOSIS — H353123 Nonexudative age-related macular degeneration, left eye, advanced atrophic without subfoveal involvement: Secondary | ICD-10-CM | POA: Diagnosis not present

## 2016-11-04 DIAGNOSIS — H43812 Vitreous degeneration, left eye: Secondary | ICD-10-CM | POA: Diagnosis not present

## 2016-11-04 DIAGNOSIS — H353221 Exudative age-related macular degeneration, left eye, with active choroidal neovascularization: Secondary | ICD-10-CM | POA: Diagnosis not present

## 2016-11-09 ENCOUNTER — Ambulatory Visit (INDEPENDENT_AMBULATORY_CARE_PROVIDER_SITE_OTHER): Payer: PPO | Admitting: Urgent Care

## 2016-11-09 VITALS — BP 151/75 | HR 34 | Temp 98.1°F | Resp 16 | Ht 72.0 in | Wt 166.4 lb

## 2016-11-09 DIAGNOSIS — R5383 Other fatigue: Secondary | ICD-10-CM | POA: Diagnosis not present

## 2016-11-09 DIAGNOSIS — R350 Frequency of micturition: Secondary | ICD-10-CM | POA: Diagnosis not present

## 2016-11-09 DIAGNOSIS — R300 Dysuria: Secondary | ICD-10-CM | POA: Diagnosis not present

## 2016-11-09 LAB — POCT URINALYSIS DIP (MANUAL ENTRY)
BILIRUBIN UA: NEGATIVE
BILIRUBIN UA: NEGATIVE mg/dL
Glucose, UA: NEGATIVE mg/dL
Nitrite, UA: NEGATIVE
PH UA: 7 (ref 5.0–8.0)
SPEC GRAV UA: 1.025 (ref 1.010–1.025)
Urobilinogen, UA: 0.2 E.U./dL

## 2016-11-09 LAB — POC MICROSCOPIC URINALYSIS (UMFC): Mucus: ABSENT

## 2016-11-09 NOTE — Patient Instructions (Addendum)
Keeping you healthy  Get these tests  Blood pressure- Have your blood pressure checked once a year by your healthcare provider.  Normal blood pressure is 120/80  Weight- Have your body mass index (BMI) calculated to screen for obesity.  BMI is a measure of body fat based on height and weight. You can also calculate your own BMI at ViewBanking.si.  Cholesterol- Have your cholesterol checked every year.  Diabetes- Have your blood sugar checked regularly if you have high blood pressure, high cholesterol, have a family history of diabetes or if you are overweight.  Screening for Colon Cancer- Colonoscopy starting at age 54.  Screening may begin sooner depending on your family history and other health conditions. Follow up colonoscopy as directed by your Gastroenterologist.  Screening for Prostate Cancer- Both blood work (PSA) and a rectal exam help screen for Prostate Cancer.  Screening begins at age 70 with African-American men and at age 35 with Caucasian men.  Screening may begin sooner depending on your family history.  Take these medicines  Aspirin- One aspirin daily can help prevent Heart disease and Stroke.  Flu shot- Every fall.  Tetanus- Every 10 years.  Zostavax- Once after the age of 87 to prevent Shingles.  Pneumonia shot- Once after the age of 55; if you are younger than 64, ask your healthcare provider if you need a Pneumonia shot.  Take these steps  Don't smoke- If you do smoke, talk to your doctor about quitting.  For tips on how to quit, go to www.smokefree.gov or call 1-800-QUIT-NOW.  Be physically active- Exercise 5 days a week for at least 30 minutes.  If you are not already physically active start slow and gradually work up to 30 minutes of moderate physical activity.  Examples of moderate activity include walking briskly, mowing the yard, dancing, swimming, bicycling, etc.  Eat a healthy diet- Eat a variety of healthy food such as fruits, vegetables, low  fat milk, low fat cheese, yogurt, lean meant, poultry, fish, beans, tofu, etc. For more information go to www.thenutritionsource.org  Drink alcohol in moderation- Limit alcohol intake to less than two drinks a day. Never drink and drive.  Dentist- Brush and floss twice daily; visit your dentist twice a year.  Depression- Your emotional health is as important as your physical health. If you're feeling down, or losing interest in things you would normally enjoy please talk to your healthcare provider.  Eye exam- Visit your eye doctor every year.  Safe sex- If you may be exposed to a sexually transmitted infection, use a condom.  Seat belts- Seat belts can save your life; always wear one.  Smoke/Carbon Monoxide detectors- These detectors need to be installed on the appropriate level of your home.  Replace batteries at least once a year.  Skin cancer- When out in the sun, cover up and use sunscreen 15 SPF or higher.  Violence- If anyone is threatening you, please tell your healthcare provider.  Living Will/ Health care power of attorney- Speak with your healthcare provider and family.     Urinary Frequency, Adult Urinary frequency means urinating more often than usual. People with urinary frequency urinate at least 8 times in 24 hours, even if they drink a normal amount of fluid. Although they urinate more often than normal, the total amount of urine produced in a day may be normal. Urinary frequency is also called pollakiuria. What are the causes? This condition may be caused by:  A urinary tract infection.  Obesity.  Bladder problems, such as bladder stones.  Caffeine or alcohol.  Eating food or drinking fluids that irritate the bladder. These include coffee, tea, soda, artificial sweeteners, citrus, tomato-based foods, and chocolate.  Certain medicines, such as medicines that help the body get rid of extra fluid (diuretics).  Muscle or nerve weakness.  Overactive  bladder.  Chronic diabetes.  Interstitial cystitis.  In men, problems with the prostate, such as an enlarged prostate.  In women, pregnancy.  In some cases, the cause may not be known. What increases the risk? This condition is more likely to develop in:  Women who have gone through menopause.  Men with prostate problems.  People with a disease or injury that affects the nerves or spinal cord.  People who have or have had a condition that affects the brain, such as a stroke.  What are the signs or symptoms? Symptoms of this condition include:  Feeling an urgent need to urinate often. The stress and anxiety of needing to find a bathroom quickly can make this urge worse.  Urinating 8 or more times in 24 hours.  Urinating as often as every 1 to 2 hours.  How is this diagnosed? This condition is diagnosed based on your symptoms, your medical history, and a physical exam. You may have tests, such as:  Blood tests.  Urine tests.  Imaging tests, such as X-rays or ultrasounds.  A bladder test.  A test of your neurological system. This is the body system that senses the need to urinate.  A test to check for problems in the urethra and bladder called cystoscopy.  You may also be asked to keep a bladder diary. A bladder diary is a record of what you eat and drink, how often you urinate, and how much you urinate. You may need to see a health care provider who specializes in conditions of the urinary tract (urologist) or kidneys (nephrologist). How is this treated? Treatment for this condition depends on the cause. Sometimes the condition goes away on its own and treatment is not necessary. If treatment is needed, it may include:  Taking medicine.  Learning exercises that strengthen the muscles that help control urination.  Following a bladder training program. This may include: ? Learning to delay going to the bathroom. ? Double urinating (voiding). This helps if you are  not completely emptying your bladder. ? Scheduled voiding.  Making diet changes, such as: ? Avoiding caffeine. ? Drinking fewer fluids, especially alcohol. ? Not drinking in the evening. ? Not having foods or drinks that may irritate the bladder. ? Eating foods that help prevent or ease constipation. Constipation can make this condition worse.  Having the nerves in your bladder stimulated. There are two options for stimulating the nerves to your bladder: ? Outpatient electrical nerve stimulation. This is done by your health care provider. ? Surgery to implant a bladder pacemaker. The pacemaker helps to control the urge to urinate.  Follow these instructions at home:  Keep a bladder diary if told to by your health care provider.  Take over-the-counter and prescription medicines only as told by your health care provider.  Do any exercises as told by your health care provider.  Follow a bladder training program as told by your health care provider.  Make any recommended diet changes.  Keep all follow-up visits as told by your health care provider. This is important. Contact a health care provider if:  You start urinating more often.  You feel pain or irritation  when you urinate.  You notice blood in your urine.  Your urine looks cloudy.  You develop a fever.  You begin vomiting. Get help right away if:  You are unable to urinate. This information is not intended to replace advice given to you by your health care provider. Make sure you discuss any questions you have with your health care provider. Document Released: 12/05/2008 Document Revised: 03/12/2015 Document Reviewed: 09/04/2014 Elsevier Interactive Patient Education  2018 [employer].    IF you received an x-ray today, you will receive an invoice from [facility]. Please contact [facility] at [phone] with questions or concerns regarding your invoice.   IF you received labwork today, you  will receive an invoice from [facility]. Please contact [facility] at [phone] with questions or concerns regarding your invoice.   Our billing staff will not be able to assist you with questions regarding bills from these companies.  You will be contacted with the lab results as soon as they are available. The fastest way to get your results is to activate your My Chart account. Instructions are located on the last page of this paperwork. If you have not heard from [place] regarding the results in 2 weeks, please contact this office.

## 2016-11-09 NOTE — Progress Notes (Signed)
   MRN: [id number] DOB: 1926/08/01  Subjective:   Chad Russell is a 83 y.o. male presenting for chief complaint of urinary frequency. Patient is coming in to try and have labs done prior to his annual exam with Dr. Carlota Russell. His only symptom currently is that he has urinary frequency. Denies fever, n/v, abdominal pain, dysuria, hematuria, urinary urgency, cloudy urine. He last ate a small meals including 2 eggs, bacon and toast at noon.   Chad Russell has a current medication list which includes the following prescription(s): ciprofloxacin, docusate sodium, finasteride, lisinopril-hydrochlorothiazide, lutein-zeaxanthin, multiple vitamins-minerals, multiple vitamins-minerals, polyethylene glycol 3350, probiotic product, timolol, turmeric, and vitamin c. Also is allergic to doxycycline and keflex [cephalexin].  Chad Russell  has a past medical history of Arthritis; Atrial fibrillation ([place]); Balance problem (10/31/2013); BPH (benign prostatic hyperplasia) (2014); Bradycardia; CAD (coronary artery disease); Cataract; Complete heart block (HCC); GERD (gastroesophageal reflux disease); echocardiogram; Hypertension; OSA (obstructive sleep apnea); and Talar fracture. Also  has a past surgical history that includes hemmorhoidectomy; Colonoscopy; Prostate biopsy; left heart catheterization with coronary angiogram (N/A, 03/16/2011); Inguinal hernia repair (Right, 02/26/2014); Insertion of mesh (Right, 02/26/2014); and Transurethral resection of prostate (N/A, 09/07/2016).  Objective:   Vitals: BP (!) 151/75   Pulse (!) 34   Temp 98.1 F (36.7 C) (Oral)   Resp 16   Ht 6' (1.829 m)   Wt 166 lb 6.4 oz (75.5 kg)   SpO2 100%   BMI 22.57 kg/m   Physical Exam  Constitutional: He is oriented to person, place, and time. He appears well-developed and well-nourished.  Cardiovascular: Normal rate.   Pulmonary/Chest: Effort normal.  Neurological: He is alert and oriented to person, place, and time.   Results for orders  placed or performed in visit on 11/09/16 (from the past 24 hour(s))  POCT urinalysis dipstick     Status: Abnormal   Collection Time: 11/09/16  5:13 PM  Result Value Ref Range   Color, UA yellow yellow   Clarity, UA cloudy (A) clear   Glucose, UA negative negative mg/dL   Bilirubin, UA negative negative   Ketones, POC UA negative negative mg/dL   Spec Grav, UA 1.025 1.010 - 1.025   Blood, UA small (A) negative   pH, UA 7.0 5.0 - 8.0   Protein Ur, POC >=300 (A) negative mg/dL   Urobilinogen, UA 0.2 0.2 or 1.0 E.U./dL   Nitrite, UA Negative Negative   Leukocytes, UA Small (1+) (A) Negative  POCT Microscopic Urinalysis ([facility])     Status: Abnormal   Collection Time: 11/09/16  5:24 PM  Result Value Ref Range   WBC,UR,HPF,POC Many (A) None WBC/hpf   RBC,UR,HPF,POC Few (A) None RBC/hpf   Bacteria Many (A) None, Too numerous to count   Mucus Absent Absent   Epithelial Cells, UR Per Microscopy Few (A) None, Too numerous to count cells/hpf   Assessment and Plan :   1. Urinary frequency 2. Fatigue, unspecified type 3. Decreased energy - Patient will have labs drawn today for review at his annual exam. He is asymptomatic. Labs pending. Return-to-clinic precautions discussed, patient verbalized understanding.   Chad Eagles, PA-C Primary Care at [facility] [phone] 11/09/2016  5:25 PM

## 2016-11-10 LAB — LIPID PANEL
CHOL/HDL RATIO: 2.1 ratio (ref 0.0–5.0)
CHOLESTEROL TOTAL: 147 mg/dL (ref 100–199)
HDL: 71 mg/dL (ref >39)
LDL CALC: 65 mg/dL (ref 0–99)
Triglycerides: 55 mg/dL (ref 0–149)
VLDL CHOLESTEROL CAL: 11 mg/dL (ref 5–40)

## 2016-11-10 LAB — CBC
HEMATOCRIT: 34.1 % — AB (ref 37.5–51.0)
HEMOGLOBIN: 11.2 g/dL — AB (ref 13.0–17.7)
MCH: 32.7 pg (ref 26.6–33.0)
MCHC: 32.8 g/dL (ref 31.5–35.7)
MCV: 99 fL — AB (ref 79–97)
Platelets: 264 10*3/uL (ref 150–379)
RBC: 3.43 x10E6/uL — ABNORMAL LOW (ref 4.14–5.80)
RDW: 14.6 % (ref 12.3–15.4)
WBC: 5.6 10*3/uL (ref 3.4–10.8)

## 2016-11-10 LAB — COMPREHENSIVE METABOLIC PANEL
ALBUMIN: 4.1 g/dL (ref 3.5–4.7)
ALT: 12 IU/L (ref 0–44)
AST: 25 IU/L (ref 0–40)
Albumin/Globulin Ratio: 1.7 (ref 1.2–2.2)
Alkaline Phosphatase: 54 IU/L (ref 39–117)
BUN / CREAT RATIO: 20 (ref 10–24)
BUN: 22 mg/dL (ref 8–27)
Bilirubin Total: 0.5 mg/dL (ref 0.0–1.2)
CHLORIDE: 102 mmol/L (ref 96–106)
CO2: 23 mmol/L (ref 20–29)
CREATININE: 1.09 mg/dL (ref 0.76–1.27)
Calcium: 9.4 mg/dL (ref 8.6–10.2)
GFR, EST AFRICAN AMERICAN: 70 mL/min/{1.73_m2} (ref >59)
GFR, EST NON AFRICAN AMERICAN: 60 mL/min/{1.73_m2} (ref >59)
GLUCOSE: 89 mg/dL (ref 65–99)
Globulin, Total: 2.4 g/dL (ref 1.5–4.5)
Potassium: 4.9 mmol/L (ref 3.5–5.2)
Sodium: 139 mmol/L (ref 134–144)
TOTAL PROTEIN: 6.5 g/dL (ref 6.0–8.5)

## 2016-11-10 LAB — URINE CULTURE

## 2016-11-10 LAB — TSH: TSH: 3.94 u[IU]/mL (ref 0.450–4.500)

## 2016-11-12 ENCOUNTER — Encounter: Payer: Self-pay | Admitting: Family Medicine

## 2016-11-12 ENCOUNTER — Ambulatory Visit (INDEPENDENT_AMBULATORY_CARE_PROVIDER_SITE_OTHER): Payer: PPO | Admitting: Family Medicine

## 2016-11-12 VITALS — BP 190/88 | HR 33 | Resp 18 | Ht 72.0 in | Wt 165.2 lb

## 2016-11-12 DIAGNOSIS — Z23 Encounter for immunization: Secondary | ICD-10-CM | POA: Diagnosis not present

## 2016-11-12 DIAGNOSIS — N138 Other obstructive and reflux uropathy: Secondary | ICD-10-CM | POA: Diagnosis not present

## 2016-11-12 DIAGNOSIS — R5381 Other malaise: Secondary | ICD-10-CM | POA: Diagnosis not present

## 2016-11-12 DIAGNOSIS — N401 Enlarged prostate with lower urinary tract symptoms: Secondary | ICD-10-CM | POA: Diagnosis not present

## 2016-11-12 NOTE — Patient Instructions (Addendum)
  For deconditioning, start with low intensity (slow walk or pool based exercise with floats if needed), and only as tolerated. Range of motion initially before adding any light weights.  Goal of slow increase activity over next 3 months.   I completed your handicap placard paperwork. Follow-up as planned in one month and we can recheck your blood count as it was borderline low but improved on blood work from a few days ago.  Return to the clinic or go to the nearest emergency room if any of your symptoms worsen or new symptoms occur.      IF you received an x-ray today, you will receive an invoice from [facility]. Please contact [facility] at [phone] with questions or concerns regarding your invoice.   IF you received labwork today, you will receive an invoice from [facility]. Please contact [facility] at [phone] with questions or concerns regarding your invoice.   Our billing staff will not be able to assist you with questions regarding bills from these companies.  You will be contacted with the lab results as soon as they are available. The fastest way to get your results is to activate your My Chart account. Instructions are located on the last page of this paperwork. If you have not heard from [place] regarding the results in 2 weeks, please contact this office.

## 2016-11-12 NOTE — Progress Notes (Signed)
Subjective:  By signing my name below, I, Chad Russell, attest that this documentation has been prepared under the direction and in the presence of Chad Ray, MD. Electronically Signed: [name], [place]. 11/12/2016 , 10:07 AM .  Patient was seen in Room 10 .   Patient ID: Chad Russell, male    DOB: 13-Dec-1926, 83 y.o.   MRN: [id number] Chief Complaint  Patient presents with  . Other    handicap paperwork    HPI Chad Russell is a 83 y.o. male  Here for completion of paperwork. He has a history of multiple medical problems including complete heart block, BPH with obstruction in lower urinary tract symptoms, status post TURP in July, balance issues and HTN. He was initially scheduled for a physical today, but rescheduled due to physical being too early.   He had Russell work done a few days ago, which was overall reassuring, including normal TSH, lipid panel, CMP, and urine culture without apparent infection. He was borderline anemic at 11.2 but improved from 1 month ago. His creatinine improved to normal level.   He mentions urinary blockage from March to July with s/p TURP and has taken him physically. He asked advice on returning to physical regime. He's been walking on an elliptical for exercise recently. He did not have physical therapy after his surgery.   He has paperwork needing to complete regarding disability from deconditioning. He denies using a brace or a crutch- but he does have a cane just in case. He does have to stop to rest after walking every 200 feet.   He also updated his pneumovax-23 today.  He is a Scientist, product/process development, and an excellent skier.   Patient Active Problem List   Diagnosis Date Noted  . BPH with obstruction/lower urinary tract symptoms 09/07/2016  . Preoperative cardiovascular examination 08/24/2016  . Dysuria 05/13/2016  . Urinary retention 05/13/2016  . Nausea without vomiting 05/13/2016  . Pelvic pain 05/13/2016  . Balance problem  10/31/2013  . OSA (obstructive sleep apnea) 10/31/2013  . BPH (benign prostatic hyperplasia) 08/28/2012  . Atrial fibrillation ([place]) 06/08/2012  . Bacteremia 04/28/2012  . Hyperplasia of prostate with lower urinary tract symptoms (LUTS) 04/28/2012  . Calf pain 04/28/2012  . Moderate malnutrition ([place]) 04/28/2012  . Influenza-like illness 04/27/2012  . Infection of urinary tract - recurrent 04/26/2012  . NSTEMI (non-ST elevated myocardial infarction) ([place]) 03/15/2011  . Complete heart block ([place]) 03/15/2011  . HTN (hypertension) 03/15/2011   Past Medical History:  Diagnosis Date  . Arthritis   . Atrial fibrillation ([place])   . Balance problem 10/31/2013  . BPH (benign prostatic hyperplasia) 2014   Urology Dr [name] with [facility] [phone]   . Bradycardia    a. baseline HR in the 30's. Asymptomatic - no history of PPM placement.   Marland Kitchen CAD (coronary artery disease)    LHC 03/16/11: Mid LAD 10-20%, proximal circumflex 10%, mid RCA 20%, EF 55%.  . Cataract   . Complete heart block ([place])   . GERD (gastroesophageal reflux disease)    takes occasional  zantac  . Hx of echocardiogram    Echo 02/2011: EF 65-70%, normal wall motion, MAC, mild MR, mild LAE, mild RVE  . Hypertension   . OSA (obstructive sleep apnea)    per patient went to have sleep study done about 2 years ago, bu they never F/U with him about results; but wife reports he still has periods of apnea at night   .  Talar fracture    casted no surg   Past Surgical History:  Procedure Laterality Date  . COLONOSCOPY     Repeated and normal in 2011  . hemmorhoidectomy    . INGUINAL HERNIA REPAIR Right 02/26/2014   Procedure: LAPAROSCOPIC RIGHT INGUINAL HERNIA REPAIR;  Surgeon: Ralene Ok, MD;  Location: [place];  Service: General;  Laterality: Right;  . INSERTION OF MESH Right 02/26/2014   Procedure: INSERTION OF MESH;  Surgeon: Ralene Ok, MD;  Location: [place];  Service: General;  Laterality: Right;  . LEFT HEART CATHETERIZATION  WITH CORONARY ANGIOGRAM N/A 03/16/2011   Procedure: LEFT HEART CATHETERIZATION WITH CORONARY ANGIOGRAM;  Surgeon: Peter M Martinique, MD;  Location: [facility] C[facility] LAB;  Service: Cardiovascular;  Laterality: N/A;  . PROSTATE BIOPSY     negative - [facility]  . TRANSURETHRAL RESECTION OF PROSTATE N/A 09/07/2016   Procedure: TRANSURETHRAL RESECTION OF THE PROSTATE (TURP);  Surgeon: Irine Seal, MD;  Location: [facility];  Service: Urology;  Laterality: N/A;   Allergies  Allergen Reactions  . Doxycycline     Upset stomach  . Keflex [Cephalexin]     Abdominal disruption   Prior to Admission medications   Medication Sig Start Date End Date Taking? Authorizing Provider  ciprofloxacin (CIPRO) 250 MG tablet Take 1 tablet (250 mg total) by mouth 2 (two) times daily. 09/09/16  Yes Irine Seal, MD  Docusate Sodium (COLACE PO) Take 1 tablet by mouth at bedtime as needed (constipation).    Yes [provider]  finasteride (PROSCAR) 5 MG tablet Take 5 mg by mouth daily.    Yes [provider]  lisinopril-hydrochlorothiazide (PRINZIDE,ZESTORETIC) 10-12.5 MG tablet TAKE 1 TABLET BY MOUTH DAILY. 04/19/16  Yes Minus Breeding, MD  LUTEIN-ZEAXANTHIN PO Take 1 tablet by mouth at bedtime.   Yes [provider]  Multiple Vitamins-Minerals (MULTIVITAMIN PO) Take 1 tablet by mouth daily.   Yes [provider]  Multiple Vitamins-Minerals (PRESERVISION AREDS 2+MULTI VIT PO) Take 1 tablet by mouth 2 (two) times daily.   Yes [provider]  POLYETHYLENE GLYCOL 3350 PO Take 17 g by mouth daily.   Yes [provider]  Probiotic Product (PROBIOTIC DAILY PO) Take 1 tablet by mouth daily.   Yes [provider]  timolol (TIMOPTIC) 0.5 % ophthalmic solution Place 1 drop into both eyes at bedtime.  12/04/15  Yes [provider]  Turmeric (CURCUMIN 95) 500 MG CAPS Take 500 mg by mouth daily.   Yes [provider]  vitamin C (ASCORBIC ACID) 500 MG tablet Take  500 mg by mouth daily.   Yes [provider]   Social History   Social History  . Marital status: Married    Spouse name: Mardene Celeste  . Number of children: 0  . Years of education: 57   Occupational History  . 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 Smoking status: Former Smoker    Packs/day: 1.00    Years: 3.00    Types: Cigarettes    Quit date: 03/17/1955  . Smokeless tobacco: Former Systems developer  . Alcohol use 8.4 oz/week    7 Cans of beer, 7 Standard drinks or equivalent per week     Comment: daily  . Drug use: No  . Sexual activity: No   Other Topics Concern  . 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  Has no children.     Review of Systems  Constitutional: Negative for fatigue and unexpected weight change.  Eyes: Negative for visual disturbance.  Respiratory: Negative for cough, chest tightness and shortness of breath.   Cardiovascular: Negative for chest pain, palpitations and leg swelling.  Gastrointestinal: Negative for abdominal pain and Russell in stool.  Neurological: Negative for dizziness, light-headedness and headaches.       Objective:   Physical Exam  Constitutional: He is oriented to person, place, and time. He appears well-developed and well-nourished.  HENT:  Head: Normocephalic and atraumatic.  Eyes: Pupils are equal, round, and reactive to light. EOM are normal.  Neck: No JVD present. Carotid bruit is not present.  Cardiovascular: Regular rhythm and normal heart sounds.  Bradycardia present.   No murmur heard. Pulmonary/Chest: Effort normal and breath sounds normal. He has no rales.  Musculoskeletal: He exhibits no edema.  1+ pitting edema to the ankles  Neurological: He is alert and oriented to person, place, and time.  Skin:  Skin is warm and dry.  Psychiatric: He has a normal mood and affect.  Vitals reviewed.   Vitals:   11/12/16 0910  BP: (!) 190/88  Pulse: (!) 33  Resp: 18  SpO2: 99%  Weight: 165 lb 3.2 oz (74.9 kg)  Height: 6' (1.829 m)      Assessment & Plan:   Chad Russell is a 83 y.o. male Physical deconditioning  - Discussed low intensity activity with slow walking or pool based exercise with floats and slow increase in level of activity as tolerated over the next few months. Handicap placard provided. If he has a plateau activity resumption, consider physical therapy evaluation.  Need for prophylactic vaccination and inoculation against influenza - Plan: Flu Vaccine QUAD 36+ mos IM  BPH with obstruction/lower urinary tract symptoms  -Status post TURP.  Continue routine follow-up with urology  No orders of the defined types were placed in this encounter.  Patient Instructions    For deconditioning, start with low intensity (slow walk or pool based exercise with floats if needed), and only as tolerated. Range of motion initially before adding any light weights.  Goal of slow increase activity over next 3 months.   I completed your handicap placard paperwork. Follow-up as planned in one month and we can recheck your Russell count as it was borderline low but improved on Russell work from a few days ago.  Return to the clinic or go to the nearest emergency room if any of your symptoms worsen or new symptoms occur.      IF you received an x-Russell today, you will receive an invoice from [facility]. Please contact [facility] at [phone] with questions or concerns regarding your invoice.   IF you received labwork today, you will receive an invoice from [facility]. Please contact [facility] at [phone] with questions or concerns regarding your invoice.   Our billing staff will not be able to assist you with questions regarding bills from these companies.  You will be  contacted with the lab results as soon as they are available. The fastest way to get your results is to activate your My Chart account. Instructions are located on the last page of this paperwork. If you have not heard from [place] regarding the results in 2 weeks, please contact this office.  I personally performed the services described in this documentation, which was scribed in my presence. The recorded information has been reviewed and considered for accuracy and completeness, addended by me as needed, and agree with information above.  Signed,   Chad Ray, MD Primary Care at [facility].  11/14/16 12:42 PM

## 2016-11-18 ENCOUNTER — Encounter: Payer: Self-pay | Admitting: Neurology

## 2016-11-18 ENCOUNTER — Ambulatory Visit (INDEPENDENT_AMBULATORY_CARE_PROVIDER_SITE_OTHER): Payer: PPO | Admitting: Neurology

## 2016-11-18 VITALS — BP 130/84 | HR 33 | Ht 72.0 in | Wt 158.0 lb

## 2016-11-18 DIAGNOSIS — D472 Monoclonal gammopathy: Secondary | ICD-10-CM | POA: Diagnosis not present

## 2016-11-18 DIAGNOSIS — R269 Unspecified abnormalities of gait and mobility: Secondary | ICD-10-CM

## 2016-11-18 DIAGNOSIS — G629 Polyneuropathy, unspecified: Secondary | ICD-10-CM | POA: Diagnosis not present

## 2016-11-18 DIAGNOSIS — G20 Parkinson's disease: Secondary | ICD-10-CM

## 2016-11-18 MED ORDER — CARBIDOPA-LEVODOPA 25-100 MG PO TABS: ORAL_TABLET | ORAL | 5 refills | Status: DC

## 2016-11-18 NOTE — Progress Notes (Signed)
Follow-up Visit   Date: 11/18/16    Chad Russell MRN: [id number] DOB: 11-28-1926   Interim History: Chad Russell is a 83 y.o. right-handed Caucasian male with atrial fibrillation on anticoagulation therapy, hypertension, and BPH returning to the clinic for follow-up of neuropathy causing gait imbalance.  The patient was accompanied to the clinic by self.  History of present illness: Starting around 2010, he began having unsteadiness when walking.  He recalls that it started suddenly after working in the yard and developing a rash to poison ivy, but he also noticed two distinct areas, which he thought were spider bites. Within a few hours, he developed visual distortion as if objects were moving slowly.  This visual disturbance improved, but since this time, he feels that his balance has always been off.  He walks independently and has not suffered any falls.  Imbalance is worse in poor lit settings, such as at night and he has placed night lights to assist him, if he needs to get up at night time use the bathroom.  He denies any numbness/tingling of the legs or low back pain.  His wife has also noticed that he tends to walk heavy with his feet.    His wife feels that his voice has become softer, movements are slower, and penmanship has become poor.  Sense of smell is good.   He drinks one glass of wine nightly. 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    UPDATE 05/14/2016:  He reports imbalance being stable without significant worsening and continues to walk unassisted and has not suffered any falls.  He is interested in starting physical therapy.  He denies any new tremor or stiffness.  He went to the ER yesterday with urinary retention and now has a foley catheter and will be seeing urology as follow-up.  No new complaints.   UPDATE 11/18/2016:  He is here for 6 month follow-up visit.  In July, he underwent TURP  due to urinary retention from BPH. He reports having up to mild to severe related to Foley placement and prostate issues, however is pleased with the results of his surgery. At his last visit, I recommended he start physical therapy and see hematology for monoclonal gammopathy, however he was unable to follow-up with this due to ongoing issues with his prostate. Overall, his gait remains unchanged with shuffling pattern and wife reports mild dragging of the left foot. Sometimes, she has noticed a tremor when his arms are outstretched, but not when resting. He denies any stiffness of the muscles or falls. He continues to walk unassisted.  Medications:  Current Outpatient Prescriptions on File Prior to Visit  Medication Sig Dispense Refill  . Docusate Sodium (COLACE PO) Take 1 tablet by mouth at bedtime as needed (constipation).     . finasteride (PROSCAR) 5 MG tablet Take 5 mg by mouth daily.     Marland Kitchen lisinopril-hydrochlorothiazide (PRINZIDE,ZESTORETIC) 10-12.5 MG tablet TAKE 1 TABLET BY MOUTH DAILY. 90 tablet 1  . LUTEIN-ZEAXANTHIN PO Take 1 tablet by mouth at bedtime.    . Multiple Vitamins-Minerals (PRESERVISION AREDS 2+MULTI VIT PO) Take 1 tablet by mouth 2 (two) times daily.    Marland Kitchen POLYETHYLENE GLYCOL 3350 PO Take 17 g by mouth daily.    . Probiotic Product (PROBIOTIC DAILY PO) Take 1 tablet by mouth daily.    . timolol (TIMOPTIC) 0.5 % ophthalmic solution Place 1 drop into  both eyes at bedtime.     . Turmeric (CURCUMIN 95) 500 MG CAPS Take 500 mg by mouth daily.    . vitamin C (ASCORBIC ACID) 500 MG tablet Take 500 mg by mouth daily.     No current facility-administered medications on file prior to visit.     Allergies:  Allergies  Allergen Reactions  . Doxycycline     Upset stomach  . Keflex [Cephalexin]     Abdominal disruption    Review of Systems:  CONSTITUTIONAL: No fevers, chills, night sweats, or weight loss.  EYES: No visual changes or eye pain ENT: No hearing changes.  No  history of nose bleeds.   RESPIRATORY: No cough, wheezing and shortness of breath.   CARDIOVASCULAR: Negative for chest pain, and palpitations.   GI: Negative for abdominal discomfort, blood in stools or black stools.  No recent change in bowel habits.   MUSCLOSKELETAL: No history of joint pain or swelling.  No myalgias.   SKIN: Negative for lesions, rash, and itching.   ENDOCRINE: Negative for cold or heat intolerance, polydipsia or goiter.   PSYCH:  No depression or anxiety symptoms.   NEURO: As Above.   Vital Signs:  BP 130/84   Pulse (!) 33   Ht 6' (1.829 m)   Wt 158 lb (71.7 kg)   SpO2 99%   BMI 21.43 kg/m   Gen.: Well appearing, comfortable  Neurological Exam: MENTAL STATUS including orientation to time, place, person, recent and remote memory, attention span and concentration, language, and fund of knowledge is normal.  Speech is not dysarthric, soft, mild loss of prosody.   CRANIAL NERVES:  Pupils equal round and reactive to light.  Normal conjugate, extra-ocular eye movements in all directions of gaze. Subtle left ptosis.  Face is symmetric.  Myerson's sign is positive.  There is left snout present.   MOTOR:  Generalized loss of muscle bulk. Motor strength is 5/5 in all extremities, except L dorsiflexion and inversion is 4+/5 and toe extensors are 4/5 bilaterally. There is a mild tremor of the hands when out-stretched, no resting tremor.  Tone is mildly increased in the LUE.  SENSORY:  Vibration remains reduced to 70% at the knees, and absent at the ankles.  REFLEXES: Reflexes are 2+/4 in the upper extremities, 3+/4 with spread at the patella and 2+/4 at the ankles.   COORDINATION/GAIT:  Normal finger-to- nose-finger.  Intact rapid alternating movements bilaterally.  Able to rise from a chair without using arms. There is slight dragging of the left foot and steps are small, shuffling type. He turns within 3 steps.  Data: NCS/EMG of the legs 12/30/2015: The  electrophysiologic findings are most consistent with a distal and symmetric sensorimotor polyneuropathy, axon loss in type, affecting the lower extremities.   Labs 12/29/2015:  SPEP with IFE IgG lambda monoclonal band, vitamin B1 60, vitamin B12 1209, copper  Lab Results  Component Value Date   TSH 3.940 11/09/2016    IMPRESSION/PLAN: 1.  Multifactorial gait instability due to neuropathy and possible parkinsonism Recommend outpatient physical therapy for gait training Hopefully, offering a trial of Sinemet will also be beneficial. Fall precautions discussed, especially with him on anticoagulation.  2.  Idiopathic peripheral axonal neuropathy causing sensory ataxia and gait imbalance. Clinically, there have been no falls and sensory exam is stable, however there is new left foot weakness with dorsiflexion which may be due to worsening neuropathy versus lumbosacral canal stenosis.  As part of his neuropathy workup, SPEP was  check and shows IgG lambda monoclonal band and he was referred to see hematology but did not go for the appointment due to ongoing issues with his prostate. He is interested in seeking their opinion now, so I will resend the referral.  3. Hyperreflexia of the lower extremities.  With his asymmetrical left dorsiflexion weakness, it is possible that he has lumbosacral canal stenosis causing these symptoms and I offered MRI lumbar spine, but he denies any pain and prefers to try physical therapy first.  I suspect there is some degree of canal stenosis of the spine as his reflexes would be diminished in a peripheral neuropathy and have low threshold image, should he develop any new back pain or progression of weakness.  4.  Parkinsonism. Exam shows blunted affect, shuffling gait, mild rigidity and tremor only with hands outstretched, no resting tremor.  I will start him on sinemet 25/100mg  half-tab TID and titrate to 1 tab TID.    Return to clinic in 3 months, or sooner as  needed   Thank you for allowing me to participate in patient's care.  If I can answer any additional questions, I would be pleased to do so.    Sincerely,    Donika K. Posey Pronto, DO

## 2016-11-18 NOTE — Patient Instructions (Addendum)
Start Carbidopa Levodopa as follows at least 30-min prior to meals:     AM  Afternoon   Evening   Week 1:  1/2 tab  1/2 tab   1/2 tab  Week 2:   1/2 tab  1/2 tab   1 tab  Week 3:  1/2 tab  1 tab   1 tab  Week 4:  1 tab  1 tab   1 tab  *Avoid taking with protein products, such as milk, meat, cheese  *if you develop nausea, take with crackers  Start physical therapy  Referral to hematology for elevated protein

## 2016-11-25 NOTE — Progress Notes (Signed)
HPI The patient presents for followup of CHB.  He was seen recently preop prior to a TURP.  From a cardiac standpoint he has done well.  The patient denies any new symptoms such as chest discomfort, neck or arm discomfort. There has been no new shortness of breath, PND or orthopnea. There have been no reported palpitations, presyncope or syncope. He is happy to not have an indwelling or in and out catheter.    Allergies  Allergen Reactions  . Doxycycline     Upset stomach  . Keflex [Cephalexin]     Abdominal disruption    Current Outpatient Prescriptions  Medication Sig Dispense Refill  . carbidopa-levodopa (SINEMET IR) 25-100 MG tablet Take 1 tab at 8:30am, 1 tablet at 1pm, and 1 tablet at 6pm. 90 tablet 5  . Docusate Sodium (COLACE PO) Take 1 tablet by mouth at bedtime as needed (constipation).     . finasteride (PROSCAR) 5 MG tablet Take 5 mg by mouth daily.     Marland Kitchen lisinopril-hydrochlorothiazide (PRINZIDE,ZESTORETIC) 10-12.5 MG tablet TAKE 1 TABLET BY MOUTH DAILY. 90 tablet 1  . LUTEIN-ZEAXANTHIN PO Take 1 tablet by mouth at bedtime.    . Multiple Vitamins-Minerals (PRESERVISION AREDS 2+MULTI VIT PO) Take 1 tablet by mouth 2 (two) times daily.    Marland Kitchen POLYETHYLENE GLYCOL 3350 PO Take 17 g by mouth daily.    . Probiotic Product (PROBIOTIC DAILY PO) Take 1 tablet by mouth daily.    . timolol (TIMOPTIC) 0.5 % ophthalmic solution Place 1 drop into both eyes at bedtime.     . Turmeric (CURCUMIN 95) 500 MG CAPS Take 500 mg by mouth daily.    . vitamin C (ASCORBIC ACID) 500 MG tablet Take 500 mg by mouth daily.    Alveda Reasons 20 MG TABS tablet      No current facility-administered medications for this visit.     Past Medical History:  Diagnosis Date  . Arthritis   . Atrial fibrillation ([place])   . Balance problem 10/31/2013  . BPH (benign prostatic hyperplasia) 2014   Urology Dr [name] with [facility] [phone]   . Bradycardia    a. baseline HR in the 30's. Asymptomatic - no history of PPM  placement.   Marland Kitchen CAD (coronary artery disease)    LHC 03/16/11: Mid LAD 10-20%, proximal circumflex 10%, mid RCA 20%, EF 55%.  . Cataract   . Complete heart block ([place])   . GERD (gastroesophageal reflux disease)    takes occasional  zantac  . Hx of echocardiogram    Echo 02/2011: EF 65-70%, normal wall motion, MAC, mild MR, mild LAE, mild RVE  . Hypertension   . OSA (obstructive sleep apnea)    per patient went to have sleep study done about 2 years ago, bu they never F/U with him about results; but wife reports he still has periods of apnea at night   . Talar fracture    casted no surg    Past Surgical History:  Procedure Laterality Date  . COLONOSCOPY     Repeated and normal in 2011  . hemmorhoidectomy    . INGUINAL HERNIA REPAIR Right 02/26/2014   Procedure: LAPAROSCOPIC RIGHT INGUINAL HERNIA REPAIR;  Surgeon: Ralene Ok, MD;  Location: [place];  Service: General;  Laterality: Right;  . INSERTION OF MESH Right 02/26/2014   Procedure: INSERTION OF MESH;  Surgeon: Ralene Ok, MD;  Location: [place];  Service: General;  Laterality: Right;  . LEFT HEART CATHETERIZATION  WITH CORONARY ANGIOGRAM N/A 03/16/2011   Procedure: LEFT HEART CATHETERIZATION WITH CORONARY ANGIOGRAM;  Surgeon: Peter M Martinique, MD;  Location: [facility];  Service: Cardiovascular;  Laterality: N/A;  . PROSTATE BIOPSY     negative - [facility]  . TRANSURETHRAL RESECTION OF PROSTATE N/A 09/07/2016   Procedure: TRANSURETHRAL RESECTION OF THE PROSTATE (TURP);  Surgeon: Irine Seal, MD;  Location: [facility];  Service: Urology;  Laterality: N/A;    ROS:   As stated in the HPI and negative for all other systems.  PHYSICAL EXAM BP (!) 168/82   Pulse (!) 35   Ht 5' 11"  (1.803 m)   Wt 170 lb (77.1 kg)   SpO2 100%   BMI 23.71 kg/m   GENERAL:  Well appearing NECK:  No jugular venous distention, waveform within normal limits, carotid upstroke brisk and symmetric, no bruits, no thyromegaly LUNGS:  Clear to auscultation  bilaterally CHEST:  Unremarkable HEART:  PMI not displaced or sustained,S1 and S2 within normal limits, no S3, no clicks, no rubs, no murmurs, irregular ABD:  Flat, positive bowel sounds normal in frequency in pitch, no bruits, no rebound, no guarding, no midline pulsatile mass, no hepatomegaly, no splenomegaly EXT:  2 plus pulses throughout, no edema, no cyanosis no clubbing    EKG:  NA  ASSESSMENT AND PLAN   ATRIAL FIBRILLATION:   Chad Russell has a CHA2DS2 - VASc score of 3 with a risk of stroke of 3.2%.  The patient  tolerates this rhythm with anticoagulation. We will continue with the meds as listed.   COMPLETE HEART BLOCK:  For many years he has had CHB and no symptoms.  He has not required beta blocker.   HTN: his blood pressure is slightly elevated but he reports that at home the systolic is below 782.  No change in therapy is planned.  He will keep a BP diary.   NQWMI:    The patient has had no further symptoms. He had minimal coronary disease on catheterization in 2013.  No change in therapy is indicated.  No further evaluation is indicated

## 2016-11-26 ENCOUNTER — Ambulatory Visit (INDEPENDENT_AMBULATORY_CARE_PROVIDER_SITE_OTHER): Payer: PPO | Admitting: Cardiology

## 2016-11-26 ENCOUNTER — Encounter: Payer: Self-pay | Admitting: Cardiology

## 2016-11-26 VITALS — BP 168/82 | HR 35 | Ht 71.0 in | Wt 170.0 lb

## 2016-11-26 DIAGNOSIS — I482 Chronic atrial fibrillation, unspecified: Secondary | ICD-10-CM

## 2016-11-26 DIAGNOSIS — I442 Atrioventricular block, complete: Secondary | ICD-10-CM | POA: Diagnosis not present

## 2016-11-26 DIAGNOSIS — I10 Essential (primary) hypertension: Secondary | ICD-10-CM | POA: Diagnosis not present

## 2016-11-26 NOTE — Patient Instructions (Signed)
Medication Instructions:  Continue current medications  If you need a refill on your cardiac medications before your next appointment, please call your pharmacy.  Labwork: None Ordered   Testing/Procedures: None Ordered  Follow-Up: Your physician wants you to follow-up in: 1 Year. You should receive a reminder letter in the mail two months in advance. If you do not receive a letter, please call our office [phone].    Thank you for choosing [facility]!!      

## 2016-11-28 ENCOUNTER — Encounter: Payer: Self-pay | Admitting: Cardiology

## 2016-11-30 ENCOUNTER — Encounter: Payer: Self-pay | Admitting: Emergency Medicine

## 2016-11-30 ENCOUNTER — Ambulatory Visit (INDEPENDENT_AMBULATORY_CARE_PROVIDER_SITE_OTHER): Payer: PPO

## 2016-11-30 ENCOUNTER — Ambulatory Visit (INDEPENDENT_AMBULATORY_CARE_PROVIDER_SITE_OTHER): Payer: PPO | Admitting: Emergency Medicine

## 2016-11-30 VITALS — BP 112/70 | HR 50 | Temp 98.4°F | Resp 17 | Ht 71.0 in | Wt 166.0 lb

## 2016-11-30 DIAGNOSIS — R0989 Other specified symptoms and signs involving the circulatory and respiratory systems: Secondary | ICD-10-CM | POA: Diagnosis not present

## 2016-11-30 DIAGNOSIS — J209 Acute bronchitis, unspecified: Secondary | ICD-10-CM

## 2016-11-30 DIAGNOSIS — R059 Cough, unspecified: Secondary | ICD-10-CM

## 2016-11-30 DIAGNOSIS — R05 Cough: Secondary | ICD-10-CM

## 2016-11-30 MED ORDER — AMOXICILLIN-POT CLAVULANATE 875-125 MG PO TABS: 1.0000 | ORAL_TABLET | Freq: Two times a day (BID) | ORAL | 0 refills | Status: AC

## 2016-11-30 NOTE — Patient Instructions (Addendum)
     IF you received an x-ray today, you will receive an invoice from [facility]. Please contact [facility] at [phone] with questions or concerns regarding your invoice.   IF you received labwork today, you will receive an invoice from [facility]. Please contact [facility] at [phone] with questions or concerns regarding your invoice.   Our billing staff will not be able to assist you with questions regarding bills from these companies.  You will be contacted with the lab results as soon as they are available. The fastest way to get your results is to activate your My Chart account. Instructions are located on the last page of this paperwork. If you have not heard from us regarding the results in 2 weeks, please contact this office.      Acute Bronchitis, Adult Acute bronchitis is when air tubes (bronchi) in the lungs suddenly get swollen. The condition can make it hard to breathe. It can also cause these symptoms:  A cough.  Coughing up clear, yellow, or green mucus.  Wheezing.  Chest congestion.  Shortness of breath.  A fever.  Body aches.  Chills.  A sore throat.  Follow these instructions at home: Medicines  Take over-the-counter and prescription medicines only as told by your doctor.  If you were prescribed an antibiotic medicine, take it as told by your doctor. Do not stop taking the antibiotic even if you start to feel better. General instructions  Rest.  Drink enough fluids to keep your pee (urine) clear or pale yellow.  Avoid smoking and secondhand smoke. If you smoke and you need help quitting, ask your doctor. Quitting will help your lungs heal faster.  Use an inhaler, cool mist vaporizer, or humidifier as told by your doctor.  Keep all follow-up visits as told by your doctor. This is important. How is this prevented? To lower your risk of getting this condition again:  Wash your hands often with soap and water. If you cannot  use soap and water, use hand sanitizer.  Avoid contact with people who have cold symptoms.  Try not to touch your hands to your mouth, nose, or eyes.  Make sure to get the flu shot every year.  Contact a doctor if:  Your symptoms do not get better in 2 weeks. Get help right away if:  You cough up blood.  You have chest pain.  You have very bad shortness of breath.  You become dehydrated.  You faint (pass out) or keep feeling like you are going to pass out.  You keep throwing up (vomiting).  You have a very bad headache.  Your fever or chills gets worse. This information is not intended to replace advice given to you by your health care provider. Make sure you discuss any questions you have with your health care provider. Document Released: 07/28/2007 Document Revised: 09/17/2015 Document Reviewed: 07/30/2015 Elsevier Interactive Patient Education  2017 [employer]  

## 2016-11-30 NOTE — Progress Notes (Signed)
Chad Russell 83 y.o.   Chief Complaint  Patient presents with  . Nasal Congestion  . URI    HISTORY OF PRESENT ILLNESS: This is a 83 y.o. male complaining of URI symptoms x 1 week.  URI   This is a new problem. The current episode started in the past 7 days. The problem has been gradually worsening. There has been no fever. Associated symptoms include congestion, coughing and sinus pain. Pertinent negatives include no abdominal pain, chest pain, diarrhea, dysuria, ear pain, headaches, joint pain, nausea, neck pain, rash, sore throat, vomiting or wheezing. Associated symptoms comments: chills. He has tried nothing for the symptoms.     Prior to Admission medications   Medication Sig Start Date End Date Taking? Authorizing Provider  carbidopa-levodopa (SINEMET IR) 25-100 MG tablet Take 1 tab at 8:30am, 1 tablet at 1pm, and 1 tablet at 6pm. 11/18/16  Yes Patel, Donika K, DO  Docusate Sodium (COLACE PO) Take 1 tablet by mouth at bedtime as needed (constipation).    Yes [provider]  finasteride (PROSCAR) 5 MG tablet Take 5 mg by mouth daily.    Yes [provider]  lisinopril-hydrochlorothiazide (PRINZIDE,ZESTORETIC) 10-12.5 MG tablet TAKE 1 TABLET BY MOUTH DAILY. 04/19/16  Yes Minus Breeding, MD  LUTEIN-ZEAXANTHIN PO Take 1 tablet by mouth at bedtime.   Yes [provider]  Multiple Vitamins-Minerals (PRESERVISION AREDS 2+MULTI VIT PO) Take 1 tablet by mouth 2 (two) times daily.   Yes [provider]  POLYETHYLENE GLYCOL 3350 PO Take 17 g by mouth daily.   Yes [provider]  Probiotic Product (PROBIOTIC DAILY PO) Take 1 tablet by mouth daily.   Yes [provider]  timolol (TIMOPTIC) 0.5 % ophthalmic solution Place 1 drop into both eyes at bedtime.  12/04/15  Yes [provider]  Turmeric (CURCUMIN 95) 500 MG CAPS Take 500 mg by mouth daily.   Yes [provider]  vitamin C (ASCORBIC ACID) 500 MG tablet Take  500 mg by mouth daily.   Yes [provider]  XARELTO 20 MG TABS tablet  11/12/16  Yes [provider]    Allergies  Allergen Reactions  . Doxycycline     Upset stomach  . Keflex [Cephalexin]     Abdominal disruption    Patient Active Problem List   Diagnosis Date Noted  . BPH with obstruction/lower urinary tract symptoms 09/07/2016  . Preoperative cardiovascular examination 08/24/2016  . Dysuria 05/13/2016  . Urinary retention 05/13/2016  . Nausea without vomiting 05/13/2016  . Pelvic pain 05/13/2016  . Balance problem 10/31/2013  . OSA (obstructive sleep apnea) 10/31/2013  . BPH (benign prostatic hyperplasia) 08/28/2012  . Atrial fibrillation ([place]) 06/08/2012  . Bacteremia 04/28/2012  . Hyperplasia of prostate with lower urinary tract symptoms (LUTS) 04/28/2012  . Calf pain 04/28/2012  . Moderate malnutrition ([place]) 04/28/2012  . Influenza-like illness 04/27/2012  . Infection of urinary tract - recurrent 04/26/2012  . NSTEMI (non-ST elevated myocardial infarction) ([place]) 03/15/2011  . Complete heart block ([place]) 03/15/2011  . HTN (hypertension) 03/15/2011    Past Medical History:  Diagnosis Date  . Arthritis   . Atrial fibrillation ([place])   . Balance problem 10/31/2013  . BPH (benign prostatic hyperplasia) 2014   Urology Dr [name] with [facility] [phone]   . Bradycardia    a. baseline HR in the 30's. Asymptomatic - no history of PPM placement.   Marland Kitchen CAD (coronary artery disease)    LHC 03/16/11: Mid  LAD 10-20%, proximal circumflex 10%, mid RCA 20%, EF 55%.  . Cataract   . Complete heart block ([place])   . GERD (gastroesophageal reflux disease)    takes occasional  zantac  . Hx of echocardiogram    Echo 02/2011: EF 65-70%, normal wall motion, MAC, mild MR, mild LAE, mild RVE  . Hypertension   . OSA (obstructive sleep apnea)    per patient went to have sleep study done about 2 years ago, bu they never F/U with him about results; but wife reports he still has  periods of apnea at night   . Talar fracture    casted no surg    Past Surgical History:  Procedure Laterality Date  . COLONOSCOPY     Repeated and normal in 2011  . hemmorhoidectomy    . INGUINAL HERNIA REPAIR Right 02/26/2014   Procedure: LAPAROSCOPIC RIGHT INGUINAL HERNIA REPAIR;  Surgeon: Ralene Ok, MD;  Location: [facility];  Service: General;  Laterality: Right;  . INSERTION OF MESH Right 02/26/2014   Procedure: INSERTION OF MESH;  Surgeon: Ralene Ok, MD;  Location: [facility];  Service: General;  Laterality: Right;  . LEFT HEART CATHETERIZATION WITH CORONARY ANGIOGRAM N/A 03/16/2011   Procedure: LEFT HEART CATHETERIZATION WITH CORONARY ANGIOGRAM;  Surgeon: Peter M Martinique, MD;  Location[facility] CATH LAB;  Service: Cardiovascular;  Laterality: N/A;  . PROSTATE BIOPSY     negative - [facility]  . TRANSURETHRAL RESECTION OF PROSTATE N/A 09/07/2016   Procedure: TRANSURETHRAL RESECTION OF THE PROSTATE (TURP);  Surgeon: Irine Seal, MD;  Location: [facility];  Service: Urology;  Laterality: N/A;    Social History   Social History  . Marital status: Married    Spouse name: Mardene Celeste  . Number of children: 0  . Years of education: 27   Occupational History  . 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 Smoking status: Former Smoker    Packs/day: 1.00    Years: 3.00    Types: Cigarettes    Quit date: 03/17/1955  . Smokeless tobacco: Former Systems developer  . Alcohol use 8.4 oz/week    7 Cans of beer, 7 Standard drinks or equivalent per week     Comment: daily  . Drug use: No  . Sexual activity: No   Other Topics Concern  . 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  Has no children.      Family History  Problem Relation Age of Onset  . Hypertension Mother   . Multiple  sclerosis Sister      Review of Systems  Constitutional: Positive for chills. Negative for fever.  HENT: Positive for congestion and sinus pain. Negative for ear pain, nosebleeds and sore throat.   Eyes: Negative for discharge and redness.  Respiratory: Positive for cough. Negative for hemoptysis, shortness of breath and wheezing.   Cardiovascular: Negative for chest pain and palpitations.  Gastrointestinal: Negative for abdominal pain, blood in stool, diarrhea, nausea and vomiting.  Genitourinary: Negative for dysuria and hematuria.  Musculoskeletal: Negative for joint pain, myalgias and neck pain.  Skin: Negative for rash.  Neurological: Negative.  Negative for dizziness and headaches.  Endo/Heme/Allergies: Negative.     Vitals:   11/30/16 1219  BP: 112/70  Pulse: (!) 50  Resp: 17  Temp: 98.4 F (36.9 C)  SpO2: 98%    Physical Exam  Constitutional: He is oriented to person, place, and time. He appears well-developed and well-nourished.  HENT:  Head: Normocephalic and atraumatic.  Eyes: Pupils are equal, round, and reactive to light. Conjunctivae and EOM are normal.  Neck: Normal range of motion. Neck supple. No JVD present. No thyromegaly present.  Cardiovascular: Normal rate, regular rhythm and normal heart sounds.   Pulmonary/Chest: Effort normal and breath sounds normal. He has no wheezes. He has no rales.  Abdominal: Soft. There is no tenderness.  Musculoskeletal: He exhibits edema (LE).  Neurological: He is alert and oriented to person, place, and time. No sensory deficit. He exhibits normal muscle tone.  Skin: Skin is warm and dry. Capillary refill takes less than 2 seconds. No rash noted.  Psychiatric: He has a normal mood and affect. His behavior is normal.  Vitals reviewed.  Dg Chest 2 View  Result Date: 11/30/2016 CLINICAL DATA:  Chest congestion and cough. EXAM: CHEST  2 VIEW COMPARISON:  Chest x-rays dated October 26, 2013 and April 28, 2012. FINDINGS: The  cardiomediastinal silhouette is normal in size. Normal pulmonary vascularity. No focal consolidation, pleural effusion, or pneumothorax. Bilateral calcified pleural plaques are again noted. No acute osseous abnormality. IMPRESSION: No active cardiopulmonary disease. Stable calcified pleural plaques bilaterally. Electronically Signed   By: Titus Dubin M.D.   On: 11/30/2016 13:05     ASSESSMENT & PLAN: Tremar was seen today for nasal congestion and uri.  Diagnoses and all orders for this visit:  Acute bronchitis, unspecified organism -     amoxicillin-clavulanate (AUGMENTIN) 875-125 MG tablet; Take 1 tablet by mouth 2 (two) times daily.  Chest congestion -     DG Chest 2 View; Future  Cough    Patient Instructions       IF you received an x-ray today, you will receive an invoice from [facility]. Please contact [facility] at [phone] with questions or concerns regarding your invoice.   IF you received labwork today, you will receive an invoice from [facility]. Please contact [facility] at [phone] with questions or concerns regarding your invoice.   Our billing staff will not be able to assist you with questions regarding bills from these companies.  You will be contacted with the lab results as soon as they are available. The fastest way to get your results is to activate your My Chart account. Instructions are located on the last page of this paperwork. If you have not heard from [place] regarding the results in 2 weeks, please contact this office.      Acute Bronchitis, Adult Acute bronchitis is when air tubes (bronchi) in the lungs suddenly get swollen. The condition can make it hard to breathe. It can also cause these symptoms:  A cough.  Coughing up clear, yellow, or green mucus.  Wheezing.  Chest congestion.  Shortness of breath.  A fever.  Body aches.  Chills.  A sore throat.  Follow these instructions at home: Medicines  Take  over-the-counter and prescription medicines only as told by your doctor.  If you were prescribed an antibiotic medicine, take it as told by your doctor. Do not stop taking the antibiotic even if you start to feel better. General instructions  Rest.  Drink enough fluids to keep your pee (urine) clear or pale yellow.  Avoid smoking and secondhand smoke. If you smoke and you need  help quitting, ask your doctor. Quitting will help your lungs heal faster.  Use an inhaler, cool mist vaporizer, or humidifier as told by your doctor.  Keep all follow-up visits as told by your doctor. This is important. How is this prevented? To lower your risk of getting this condition again:  Wash your hands often with soap and water. If you cannot use soap and water, use hand sanitizer.  Avoid contact with people who have cold symptoms.  Try not to touch your hands to your mouth, nose, or eyes.  Make sure to get the flu shot every year.  Contact a doctor if:  Your symptoms do not get better in 2 weeks. Get help right away if:  You cough up blood.  You have chest pain.  You have very bad shortness of breath.  You become dehydrated.  You faint (pass out) or keep feeling like you are going to pass out.  You keep throwing up (vomiting).  You have a very bad headache.  Your fever or chills gets worse. This information is not intended to replace advice given to you by your health care provider. Make sure you discuss any questions you have with your health care provider. Document Released: 07/28/2007 Document Revised: 09/17/2015 Document Reviewed: 07/30/2015 Elsevier Interactive Patient Education  2017 [employer]      Agustina Caroli, MD [facility]

## 2016-12-07 DIAGNOSIS — H353221 Exudative age-related macular degeneration, left eye, with active choroidal neovascularization: Secondary | ICD-10-CM | POA: Diagnosis not present

## 2016-12-07 DIAGNOSIS — H353211 Exudative age-related macular degeneration, right eye, with active choroidal neovascularization: Secondary | ICD-10-CM | POA: Diagnosis not present

## 2016-12-07 DIAGNOSIS — H353113 Nonexudative age-related macular degeneration, right eye, advanced atrophic without subfoveal involvement: Secondary | ICD-10-CM | POA: Diagnosis not present

## 2016-12-07 DIAGNOSIS — H353222 Exudative age-related macular degeneration, left eye, with inactive choroidal neovascularization: Secondary | ICD-10-CM | POA: Diagnosis not present

## 2016-12-09 DIAGNOSIS — N401 Enlarged prostate with lower urinary tract symptoms: Secondary | ICD-10-CM | POA: Diagnosis not present

## 2016-12-09 DIAGNOSIS — R338 Other retention of urine: Secondary | ICD-10-CM | POA: Diagnosis not present

## 2016-12-09 DIAGNOSIS — Z87440 Personal history of urinary (tract) infections: Secondary | ICD-10-CM | POA: Diagnosis not present

## 2016-12-16 ENCOUNTER — Encounter: Payer: Self-pay | Admitting: Family Medicine

## 2016-12-16 ENCOUNTER — Ambulatory Visit (INDEPENDENT_AMBULATORY_CARE_PROVIDER_SITE_OTHER): Payer: PPO

## 2016-12-16 ENCOUNTER — Ambulatory Visit (INDEPENDENT_AMBULATORY_CARE_PROVIDER_SITE_OTHER): Payer: PPO | Admitting: Family Medicine

## 2016-12-16 VITALS — BP 134/80 | HR 33 | Temp 97.1°F | Resp 18 | Ht 71.0 in | Wt 167.8 lb

## 2016-12-16 DIAGNOSIS — N400 Enlarged prostate without lower urinary tract symptoms: Secondary | ICD-10-CM | POA: Diagnosis not present

## 2016-12-16 DIAGNOSIS — I442 Atrioventricular block, complete: Secondary | ICD-10-CM | POA: Diagnosis not present

## 2016-12-16 DIAGNOSIS — H353221 Exudative age-related macular degeneration, left eye, with active choroidal neovascularization: Secondary | ICD-10-CM | POA: Diagnosis not present

## 2016-12-16 DIAGNOSIS — Z0000 Encounter for general adult medical examination without abnormal findings: Secondary | ICD-10-CM | POA: Diagnosis not present

## 2016-12-16 DIAGNOSIS — M545 Low back pain, unspecified: Secondary | ICD-10-CM

## 2016-12-16 DIAGNOSIS — H353211 Exudative age-related macular degeneration, right eye, with active choroidal neovascularization: Secondary | ICD-10-CM | POA: Diagnosis not present

## 2016-12-16 DIAGNOSIS — I10 Essential (primary) hypertension: Secondary | ICD-10-CM | POA: Diagnosis not present

## 2016-12-16 DIAGNOSIS — J209 Acute bronchitis, unspecified: Secondary | ICD-10-CM | POA: Diagnosis not present

## 2016-12-16 DIAGNOSIS — G20 Parkinson's disease: Secondary | ICD-10-CM | POA: Diagnosis not present

## 2016-12-16 DIAGNOSIS — R059 Cough, unspecified: Secondary | ICD-10-CM

## 2016-12-16 DIAGNOSIS — R05 Cough: Secondary | ICD-10-CM

## 2016-12-16 DIAGNOSIS — R269 Unspecified abnormalities of gait and mobility: Secondary | ICD-10-CM

## 2016-12-16 DIAGNOSIS — I4891 Unspecified atrial fibrillation: Secondary | ICD-10-CM | POA: Diagnosis not present

## 2016-12-16 NOTE — Progress Notes (Signed)
Subjective:  This chart was scribed for [name], MD by Tamsen Roers, at [place] [facility].  This patient was seen in room 11 and the patient's care was started at 9:50 AM.   Chief Complaint  Patient presents with  . Annual Exam    Medicare PE     Patient ID: Chad Russell, male    DOB: 12/10/1926, 82 y.o.   MRN: [id number]  HPI HPI Comments: Chad Russell is a 83 y.o. male who presents to Primary Care for a wellness exam.  History of multiple medical problems including complete heart block, hypertension, BPH, OSA, balance issues.    Back pain: Patient has been having lower right sided back pain for the past two weeks.  He denies any recent injuries and thinks it may be due to lifting something heavy.  His back pain is effecting his ability to walk with ease  Denies loss of control of bladder/bowel movements, numbness when wiping or weakness in his legs.  He has been taking Tylenol (2 per day) and feels like this helps alleviate his pain.    Balance issues/gait difficulty: He was seen by neurology Dr. Posey Pronto on September 27 th. He had been treated with physical therapy but had noticed a shuffling pattern to the gait and dragging of his foot as well as a tremor in his arms. Suspecting parkinsonism, started on Sinemet 20-100 initially half tab TID with plan for titration to 1 tab TID over four weeks. He was also referred to hematology for monoclonal gammopathy. Also thought his instability in gait was due to neuropathy.  Outpatient physical therapy.  He also had hyperreflexia of lower extremities, possible lumbar stenosis, MRI spine was offered by neuro, declined at present, plan for initial PT. ---- Patient has stopped taking Sinemet and has not yet notified his neurologist regarding this. He stopped taking it due to his worsening balance after taking the medication. He is no longer attending physical therapy due to his back pain.    Complete Heart Block/history  of atrial fibrillation : Dr Percival Spanish- cardiologist most recently seen October 5th. -asymptomatic.  CHADS #2-VASC score of 3. He was continued on anti-coagulation no other changes, plan for one year follow up.    Bronchitis: Treated with Augmentin October 9th.  Without acute findings on chest x-ray at that time. ---- Patient still has a cough (worse last night), has not been coughing this morning.  He has associated symptoms of sinus draining and has been taking Alka Seltzer plus which contains Guaifenesin and Dextromethorphan. He denies any fevers/chills   BPH: He had a Turp in July.  Was continued on Proscar 5 mg QD. Doing well currently.   Hypertension: continued on Lisinopril HCT- same dose by Cardiology. No new side effects   Immunizations:  Immunization History  Administered Date(s) Administered  . Influenza Nasal 11/05/2012  . Influenza Split 11/15/2011  . Influenza,inj,Quad PF,6+ Mos 12/04/2013, 12/06/2014, 11/12/2016  . Influenza-Unspecified 11/19/2015  . Pneumococcal Conjugate-13 03/13/2015  . Pneumococcal Polysaccharide-23 11/12/2016    Fall screening: no falls in the past year.    Depression:  Depression screen [facility] 2/9 12/16/2016 11/30/2016 11/12/2016 06/08/2016 06/02/2016  Decreased Interest 0 0 0 0 0  Down, Depressed, Hopeless 0 0 0 0 0  PHQ - 2 Score 0 0 0 0 0    Functional Status Survey: Is the patient deaf or have difficulty hearing?: No Does the patient have difficulty seeing, even when wearing glasses/contacts?: No Does the  patient have difficulty concentrating, remembering, or making decisions?: No Does the patient have difficulty walking or climbing stairs?: Yes Does the patient have difficulty dressing or bathing?: No Does the patient have difficulty doing errands alone such as visiting a doctor's office or shopping?: No Difficulty walking and climbing the stairs due to back pain.    Dementia screening:  6CIT Screen 12/16/2016  What Year? 0 points  What  month? 0 points  What time? 0 points  Count back from 20 0 points  Months in reverse 0 points  Repeat phrase 0 points  Total Score 0    Vision:   Visual Acuity Screening   Right eye Left eye Both eyes  Without correction: 20/100 20/50 20/70  With correction:     Hearing Screening Comments: Pt could not hear colors for whisper test.    Advanced Directives: Information provided in September.    Patient Active Problem List   Diagnosis Date Noted  . Chest congestion 11/30/2016  . Cough 11/30/2016  . Acute bronchitis 11/30/2016  . BPH with obstruction/lower urinary tract symptoms 09/07/2016  . Preoperative cardiovascular examination 08/24/2016  . Dysuria 05/13/2016  . Urinary retention 05/13/2016  . Nausea without vomiting 05/13/2016  . Pelvic pain 05/13/2016  . Balance problem 10/31/2013  . OSA (obstructive sleep apnea) 10/31/2013  . BPH (benign prostatic hyperplasia) 08/28/2012  . Atrial fibrillation ([place]) 06/08/2012  . Bacteremia 04/28/2012  . Hyperplasia of prostate with lower urinary tract symptoms (LUTS) 04/28/2012  . Calf pain 04/28/2012  . Moderate malnutrition ([place]) 04/28/2012  . Influenza-like illness 04/27/2012  . Infection of urinary tract - recurrent 04/26/2012  . NSTEMI (non-ST elevated myocardial infarction) ([place]) 03/15/2011  . Complete heart block ([place]) 03/15/2011  . HTN (hypertension) 03/15/2011   Past Medical History:  Diagnosis Date  . Arthritis   . Atrial fibrillation ([place])   . Balance problem 10/31/2013  . BPH (benign prostatic hyperplasia) 2014   Urology Dr [name] with [facility] [phone]   . Bradycardia    a. baseline HR in the 30's. Asymptomatic - no history of PPM placement.   Marland Kitchen CAD (coronary artery disease)    LHC 03/16/11: Mid LAD 10-20%, proximal circumflex 10%, mid RCA 20%, EF 55%.  . Cataract   . Complete heart block ([place])   . GERD (gastroesophageal reflux disease)    takes occasional  zantac  . Hx of echocardiogram    Echo 02/2011: EF  65-70%, normal wall motion, MAC, mild MR, mild LAE, mild RVE  . Hypertension   . OSA (obstructive sleep apnea)    per patient went to have sleep study done about 2 years ago, bu they never F/U with him about results; but wife reports he still has periods of apnea at night   . Talar fracture    casted no surg   Past Surgical History:  Procedure Laterality Date  . COLONOSCOPY     Repeated and normal in 2011  . hemmorhoidectomy    . INGUINAL HERNIA REPAIR Right 02/26/2014   Procedure: LAPAROSCOPIC RIGHT INGUINAL HERNIA REPAIR;  Surgeon: Ralene Ok, MD;  Location: [facility];  Service: General;  Laterality: Right;  . INSERTION OF MESH Right 02/26/2014   Procedure: INSERTION OF MESH;  Surgeon: Ralene Ok, MD;  Location: [place];  Service: General;  Laterality: Right;  . LEFT HEART CATHETERIZATION WITH CORONARY ANGIOGRAM N/A 03/16/2011   Procedure: LEFT HEART CATHETERIZATION WITH CORONARY ANGIOGRAM;  Surgeon: Peter M Martinique, MD;  Location: [facility] CATH LAB;  Service:  Cardiovascular;  Laterality: N/A;  . PROSTATE BIOPSY     negative - [facility]  . TRANSURETHRAL RESECTION OF PROSTATE N/A 09/07/2016   Procedure: TRANSURETHRAL RESECTION OF THE PROSTATE (TURP);  Surgeon: Irine Seal, MD;  Location: [facility];  Service: Urology;  Laterality: N/A;   Allergies  Allergen Reactions  . Doxycycline     Upset stomach  . Keflex [Cephalexin]     Abdominal disruption   Prior to Admission medications   Medication Sig Start Date End Date Taking? Authorizing Provider  carbidopa-levodopa (SINEMET IR) 25-100 MG tablet Take 1 tab at 8:30am, 1 tablet at 1pm, and 1 tablet at 6pm. 11/18/16   Patel, Arvin Collard K, DO  Docusate Sodium (COLACE PO) Take 1 tablet by mouth at bedtime as needed (constipation).     [provider]  finasteride (PROSCAR) 5 MG tablet Take 5 mg by mouth daily.     [provider]  lisinopril-hydrochlorothiazide (PRINZIDE,ZESTORETIC) 10-12.5 MG tablet TAKE 1 TABLET BY MOUTH DAILY.  04/19/16   Minus Breeding, MD  LUTEIN-ZEAXANTHIN PO Take 1 tablet by mouth at bedtime.    [provider]  Multiple Vitamins-Minerals (PRESERVISION AREDS 2+MULTI VIT PO) Take 1 tablet by mouth 2 (two) times daily.    [provider]  POLYETHYLENE GLYCOL 3350 PO Take 17 g by mouth daily.    [provider]  Probiotic Product (PROBIOTIC DAILY PO) Take 1 tablet by mouth daily.    [provider]  timolol (TIMOPTIC) 0.5 % ophthalmic solution Place 1 drop into both eyes at bedtime.  12/04/15   [provider]  Turmeric (CURCUMIN 95) 500 MG CAPS Take 500 mg by mouth daily.    [provider]  vitamin C (ASCORBIC ACID) 500 MG tablet Take 500 mg by mouth daily.    [provider]  Alveda Reasons 20 MG TABS tablet  11/12/16   [provider]   Social History   Social History  . Marital status: Married    Spouse name: Mardene Celeste  . Number of children: 0  . Years of education: 56   Occupational History  . 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 Smoking status: Former Smoker    Packs/day: 1.00    Years: 3.00    Types: Cigarettes    Quit date: 03/17/1955  . Smokeless tobacco: Former Systems developer  . Alcohol use 8.4 oz/week    7 Cans of beer, 7 Standard drinks or equivalent per week     Comment: daily  . Drug use: No  . Sexual activity: No   Other Topics Concern  . 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  Has no children.        Review of Systems  HENT: Positive for rhinorrhea.   Musculoskeletal: Positive for back pain.  All other systems reviewed and are negative.      Objective:   Physical Exam  Constitutional: He is oriented to person, place, and time. He appears well-developed and well-nourished.  HENT:    Head: Normocephalic and atraumatic.  Right Ear: External ear normal.  Left Ear: External ear normal.  Mouth/Throat: Oropharynx is clear and moist.  Eyes: Pupils are equal, round, and reactive to light. Conjunctivae  and EOM are normal.  Neck: Normal range of motion. Neck supple. No thyromegaly present.  Cardiovascular: Normal rate, regular rhythm, normal heart sounds and intact distal pulses.   Slow heart rate but regular.   Pulmonary/Chest: Effort normal and breath sounds normal. No respiratory distress. He has no wheezes.  Breath sounds clear, good air movement throughout   Abdominal: Soft. He exhibits no distension. There is no tenderness.  Musculoskeletal: Normal range of motion. He exhibits no edema or tenderness.  Back pain locates to the right paraspinal area, no midline bony tenderness, negative seated straight leg raise.   Lymphadenopathy:    He has no cervical adenopathy.  Neurological: He is alert and oriented to person, place, and time. He has normal reflexes.  Skin: Skin is warm and dry.  Psychiatric: He has a normal mood and affect. His behavior is normal.  Vitals reviewed.    Vitals:   12/16/16 0913  BP: 134/80  Pulse: (!) 33  Resp: 18  Temp: (!) 97.1 F (36.2 C)  TempSrc: Oral  SpO2: 99%  Weight: 167 lb 12.8 oz (76.1 kg)  Height: _0  (1.803 m)    Dg Chest 2 View  Result Date: 12/16/2016 CLINICAL DATA:  Cough.  History of bronchitis. EXAM: CHEST  2 VIEW COMPARISON:  PA and lateral chest 11/30/2016 and 10/26/2013. FINDINGS: The chest is hyperexpanded with attenuation of the pulmonary vasculature. Calcified pleural plaques are noted. Lungs are clear. Heart size is normal. No pneumothorax or pleural fluid. Aortic atherosclerosis is identified. No acute bony abnormality. IMPRESSION: No acute disease. Hyperexpansion compatible with emphysema. Calcified pleural plaques consistent with prior asbestos exposure. Electronically Signed   By: Inge Rise M.D.   On:  12/16/2016 10:21        Assessment & Plan:    Chad Russell is a 83 y.o. male Medicare annual wellness visit, subsequent  - anticipatory guidance as below in AVS, screening labs if needed. Health maintenance items as above in HPI discussed/recommended as applicable.   - no concerning responses on depression, fall, or functional status screening. Any positive responses noted as above. Advanced directives discussed as in CHL.   Parkinsonism, unspecified Parkinsonism type ([place]) Abnormality of gait  - Reportedly worse with use of Sinemet. May be related to his back. Advised to follow-up with neuro to discuss next step. RTC precautions discussed as below for lumbar spine issues  CHB (complete heart block) (HCC) Atrial fibrillation, unspecified type ([place])  - Stable, has regular follow-up with cardiology, no med changes. Asymptomatic heart block.  Benign prostatic hyperplasia, unspecified whether lower urinary tract symptoms present  - Status post TURP. Stable currently with urination.  Essential hypertension  -Stable, no med changes  Cough - Plan: DG Chest 2 View Acute bronchitis, unspecified organism - Plan: DG Chest 2 View  -Previous treated for bronchitis, some persistent cough. No apparent acute findings on chest x-ray and afebrile. Suspect healing bronchitis.  -Continue Mucinex DM, recheck in 2 weeks, sooner if worse  Acute right-sided low back pain without sciatica  - History of low back pain, slightly worse currently, denies weakness in legs. Negative straight leg raise. May be related to his gait issues. Would consider advanced imaging with MRI if persistent or worsening, but plans to discuss this further with neuro. Recheck back in the next 2 weeks. Tylenol okay if needed.  Meds ordered this encounter  Medications  . Calcium Carbonate Antacid (ALKA-SELTZER ANTACID PO)    Sig: Take by mouth.   Patient  Instructions   I would recommend following up with neurology  regarding the balance issue and possible symptom worsening with Sinemet. It is unlikely the Sinemet made that worse, but I am concerned that your back may be more of an issue and may need to have MRI. If any new weakness, acute worsening of back pain, or new incontinence - return here or emergency room. If tylenol helps, ok to continue. Return to the clinic or go to the nearest emergency room if any of your symptoms worsen or new symptoms occur.  For cough, continue guaifenesin and dextromethorphan as needed. That should be improving in the next 2 weeks, not worsening. If you experience fever or other worsening symptoms, return for recheck. Ok to try flonase nasal spray for congestion as postnasal drip may also be contributing to cough.   Please recheck in next 2 weeks for back and cough. Return to the clinic or go to the nearest emergency room if any of your symptoms worsen or new symptoms occur.    Back Pain, Adult Back pain is very common in adults.The cause of back pain is rarely dangerous and the pain often gets better over time.The cause of your back pain may not be known. Some common causes of back pain include:  Strain of the muscles or ligaments supporting the spine.  Wear and tear (degeneration) of the spinal disks.  Arthritis.  Direct injury to the back.  For many people, back pain may return. Since back pain is rarely dangerous, most people can learn to manage this condition on their own. Follow these instructions at home: Watch your back pain for any changes. The following actions may help to lessen any discomfort you are feeling:  Remain active. It is stressful on your back to sit or stand in one place for long periods of time. Do not sit, drive, or stand in one place for more than 30 minutes at a time. Take short walks on even surfaces as soon as you are able.Try to increase the length of time you walk each day.  Exercise regularly as directed by your health care provider.  Exercise helps your back heal faster. It also helps avoid future injury by keeping your muscles strong and flexible.  Do not stay in bed.Resting more than 1-2 days can delay your recovery.  Pay attention to your body when you bend and lift. The most comfortable positions are those that put less stress on your recovering back. Always use proper lifting techniques, including: ? Bending your knees. ? Keeping the load close to your body. ? Avoiding twisting.  Find a comfortable position to sleep. Use a firm mattress and lie on your side with your knees slightly bent. If you lie on your back, put a pillow under your knees.  Avoid feeling anxious or stressed.Stress increases muscle tension and can worsen back pain.It is important to recognize when you are anxious or stressed and learn ways to manage it, such as with exercise.  Take medicines only as directed by your health care provider. Over-the-counter medicines to reduce pain and inflammation are often the most helpful.Your health care provider may prescribe muscle relaxant drugs.These medicines help dull your pain so you can more quickly return to your normal activities and healthy exercise.  Apply ice to the injured area: ? Put ice in a plastic bag. ? Place a towel between your skin and the bag. ? Leave the ice on for 20 minutes, 2-3 times a day for the first 2-3  days. After that, ice and heat may be alternated to reduce pain and spasms.  Maintain a healthy weight. Excess weight puts extra stress on your back and makes it difficult to maintain good posture.  Contact a health care provider if:  You have pain that is not relieved with rest or medicine.  You have increasing pain going down into the legs or buttocks.  You have pain that does not improve in one week.  You have night pain.  You lose weight.  You have a fever or chills. Get help right away if:  You develop new bowel or bladder control problems.  You have unusual  weakness or numbness in your arms or legs.  You develop nausea or vomiting.  You develop abdominal pain.  You feel faint. This information is not intended to replace advice given to you by your health care provider. Make sure you discuss any questions you have with your health care provider. Document Released: 02/08/2005 Document Revised: 06/19/2015 Document Reviewed: 06/12/2013 Elsevier Interactive Patient Education  2017 Elsevier Inc.   Cough, Adult Coughing is a reflex that clears your throat and your airways. Coughing helps to heal and protect your lungs. It is normal to cough occasionally, but a cough that happens with other symptoms or lasts a long time may be a sign of a condition that needs treatment. A cough may last only 2-3 weeks (acute), or it may last longer than 8 weeks (chronic). What are the causes? Coughing is commonly caused by:  Breathing in substances that irritate your lungs.  A viral or bacterial respiratory infection.  Allergies.  Asthma.  Postnasal drip.  Smoking.  Acid backing up from the stomach into the esophagus (gastroesophageal reflux).  Certain medicines.  Chronic lung problems, including COPD (or rarely, lung cancer).  Other medical conditions such as heart failure.  Follow these instructions at home: Pay attention to any changes in your symptoms. Take these actions to help with your discomfort:  Take medicines only as told by your health care provider. ? If you were prescribed an antibiotic medicine, take it as told by your health care provider. Do not stop taking the antibiotic even if you start to feel better. ? Talk with your health care provider before you take a cough suppressant medicine.  Drink enough fluid to keep your urine clear or pale yellow.  If the air is dry, use a cold steam vaporizer or humidifier in your bedroom or your home to help loosen secretions.  Avoid anything that causes you to cough at work or at home.  If  your cough is worse at night, try sleeping in a semi-upright position.  Avoid cigarette smoke. If you smoke, quit smoking. If you need help quitting, ask your health care provider.  Avoid caffeine.  Avoid alcohol.  Rest as needed.  Contact a health care provider if:  You have new symptoms.  You cough up pus.  Your cough does not get better after 2-3 weeks, or your cough gets worse.  You cannot control your cough with suppressant medicines and you are losing sleep.  You develop pain that is getting worse or pain that is not controlled with pain medicines.  You have a fever.  You have unexplained weight loss.  You have night sweats. Get help right away if:  You cough up blood.  You have difficulty breathing.  Your heartbeat is very fast. This information is not intended to replace advice given to you by your health care  provider. Make sure you discuss any questions you have with your health care provider. Document Released: 08/07/2010 Document Revised: 07/17/2015 Document Reviewed: 04/17/2014 Elsevier Interactive Patient Education  2017 [place] you healthy  Get these tests  Blood pressure- Have your blood pressure checked once a year by your healthcare provider.  Normal blood pressure is 120/80  Weight- Have your body mass index (BMI) calculated to screen for obesity.  BMI is a measure of body fat based on height and weight. You can also calculate your own BMI at ViewBanking.si.  Cholesterol- Have your cholesterol checked every year.  Diabetes- Have your blood sugar checked regularly if you have high blood pressure, high cholesterol, have a family history of diabetes or if you are overweight.  Screening for Colon Cancer- Colonoscopy starting at age 86.  Screening may begin sooner depending on your family history and other health conditions. Follow up colonoscopy as directed by your Gastroenterologist.  Screening for Prostate Cancer-  Both blood work (PSA) and a rectal exam help screen for Prostate Cancer.  Screening begins at age 66 with African-American men and at age 68 with Caucasian men.  Screening may begin sooner depending on your family history.  Take these medicines  Aspirin- One aspirin daily can help prevent Heart disease and Stroke.  Flu shot- Every fall.  Tetanus- Every 10 years.  Zostavax- Once after the age of 41 to prevent Shingles.  Pneumonia shot- Once after the age of 58; if you are younger than 2, ask your healthcare provider if you need a Pneumonia shot.  Take these steps  Don't smoke- If you do smoke, talk to your doctor about quitting.  For tips on how to quit, go to www.smokefree.gov or call 1-800-QUIT-NOW.  Be physically active- Exercise 5 days a week for at least 30 minutes.  If you are not already physically active start slow and gradually work up to 30 minutes of moderate physical activity.  Examples of moderate activity include walking briskly, mowing the yard, dancing, swimming, bicycling, etc.  Eat a healthy diet- Eat a variety of healthy food such as fruits, vegetables, low fat milk, low fat cheese, yogurt, lean meant, poultry, fish, beans, tofu, etc. For more information go to www.thenutritionsource.org  Drink alcohol in moderation- Limit alcohol intake to less than two drinks a day. Never drink and drive.  Dentist- Brush and floss twice daily; visit your dentist twice a year.  Depression- Your emotional health is as important as your physical health. If you're feeling down, or losing interest in things you would normally enjoy please talk to your healthcare provider.  Eye exam- Visit your eye doctor every year.  Safe sex- If you may be exposed to a sexually transmitted infection, use a condom.  Seat belts- Seat belts can save your life; always wear one.  Smoke/Carbon Monoxide detectors- These detectors need to be installed on the appropriate level of your home.  Replace  batteries at least once a year.  Skin cancer- When out in the sun, cover up and use sunscreen 15 SPF or higher.  Violence- If anyone is threatening you, please tell your healthcare provider.  Living Will/ Health care power of attorney- Speak with your healthcare provider and family.  IF you received an x-ray today, you will receive an invoice from [facility]. Please contact [facility] at [phone] with questions or concerns regarding your invoice.   IF you received labwork today, you will receive an invoice from [facility]. Please  contact [facility] at [phone] with questions or concerns regarding your invoice.   Our billing staff will not be able to assist you with questions regarding bills from these companies.  You will be contacted with the lab results as soon as they are available. The fastest way to get your results is to activate your My Chart account. Instructions are located on the last page of this paperwork. If you have not heard from [place] regarding the results in 2 weeks, please contact this office.      I personally performed the services described in this documentation, which was scribed in my presence. The recorded information has been reviewed and considered for accuracy and completeness, addended by me as needed, and agree with information above.  Signed,   [name], MD [facility].  12/19/16 12:58 PM

## 2016-12-16 NOTE — Patient Instructions (Addendum)
I would recommend following up with neurology regarding the balance issue and possible symptom worsening with Sinemet. It is unlikely the Sinemet made that worse, but I am concerned that your back may be more of an issue and may need to have MRI. If any new weakness, acute worsening of back pain, or new incontinence - return here or emergency room. If tylenol helps, ok to continue. Return to the clinic or go to the nearest emergency room if any of your symptoms worsen or new symptoms occur.  For cough, continue guaifenesin and dextromethorphan as needed. That should be improving in the next 2 weeks, not worsening. If you experience fever or other worsening symptoms, return for recheck. Ok to try flonase nasal spray for congestion as postnasal drip may also be contributing to cough.   Please recheck in next 2 weeks for back and cough. Return to the clinic or go to the nearest emergency room if any of your symptoms worsen or new symptoms occur.    Back Pain, Adult Back pain is very common in adults.The cause of back pain is rarely dangerous and the pain often gets better over time.The cause of your back pain may not be known. Some common causes of back pain include:  Strain of the muscles or ligaments supporting the spine.  Wear and tear (degeneration) of the spinal disks.  Arthritis.  Direct injury to the back.  For many people, back pain may return. Since back pain is rarely dangerous, most people can learn to manage this condition on their own. Follow these instructions at home: Watch your back pain for any changes. The following actions may help to lessen any discomfort you are feeling:  Remain active. It is stressful on your back to sit or stand in one place for long periods of time. Do not sit, drive, or stand in one place for more than 30 minutes at a time. Take short walks on even surfaces as soon as you are able.Try to increase the length of time you walk each day.  Exercise  regularly as directed by your health care provider. Exercise helps your back heal faster. It also helps avoid future injury by keeping your muscles strong and flexible.  Do not stay in bed.Resting more than 1-2 days can delay your recovery.  Pay attention to your body when you bend and lift. The most comfortable positions are those that put less stress on your recovering back. Always use proper lifting techniques, including: ? Bending your knees. ? Keeping the load close to your body. ? Avoiding twisting.  Find a comfortable position to sleep. Use a firm mattress and lie on your side with your knees slightly bent. If you lie on your back, put a pillow under your knees.  Avoid feeling anxious or stressed.Stress increases muscle tension and can worsen back pain.It is important to recognize when you are anxious or stressed and learn ways to manage it, such as with exercise.  Take medicines only as directed by your health care provider. Over-the-counter medicines to reduce pain and inflammation are often the most helpful.Your health care provider may prescribe muscle relaxant drugs.These medicines help dull your pain so you can more quickly return to your normal activities and healthy exercise.  Apply ice to the injured area: ? Put ice in a plastic bag. ? Place a towel between your skin and the bag. ? Leave the ice on for 20 minutes, 2-3 times a day for the first 2-3 days. After that, ice  and heat may be alternated to reduce pain and spasms.  Maintain a healthy weight. Excess weight puts extra stress on your back and makes it difficult to maintain good posture.  Contact a health care provider if:  You have pain that is not relieved with rest or medicine.  You have increasing pain going down into the legs or buttocks.  You have pain that does not improve in one week.  You have night pain.  You lose weight.  You have a fever or chills. Get help right away if:  You develop new  bowel or bladder control problems.  You have unusual weakness or numbness in your arms or legs.  You develop nausea or vomiting.  You develop abdominal pain.  You feel faint. This information is not intended to replace advice given to you by your health care provider. Make sure you discuss any questions you have with your health care provider. Document Released: 02/08/2005 Document Revised: 06/19/2015 Document Reviewed: 06/12/2013 Elsevier Interactive Patient Education  2017 [employer]   Cough, Adult Coughing is a reflex that clears your throat and your airways. Coughing helps to heal and protect your lungs. It is normal to cough occasionally, but a cough that happens with other symptoms or lasts a long time may be a sign of a condition that needs treatment. A cough may last only 2-3 weeks (acute), or it may last longer than 8 weeks (chronic). What are the causes? Coughing is commonly caused by:  Breathing in substances that irritate your lungs.  A viral or bacterial respiratory infection.  Allergies.  Asthma.  Postnasal drip.  Smoking.  Acid backing up from the stomach into the esophagus (gastroesophageal reflux).  Certain medicines.  Chronic lung problems, including COPD (or rarely, lung cancer).  Other medical conditions such as heart failure.  Follow these instructions at home: Pay attention to any changes in your symptoms. Take these actions to help with your discomfort:  Take medicines only as told by your health care provider. ? If you were prescribed an antibiotic medicine, take it as told by your health care provider. Do not stop taking the antibiotic even if you start to feel better. ? Talk with your health care provider before you take a cough suppressant medicine.  Drink enough fluid to keep your urine clear or pale yellow.  If the air is dry, use a cold steam vaporizer or humidifier in your bedroom or your home to help loosen secretions.  Avoid  anything that causes you to cough at work or at home.  If your cough is worse at night, try sleeping in a semi-upright position.  Avoid cigarette smoke. If you smoke, quit smoking. If you need help quitting, ask your health care provider.  Avoid caffeine.  Avoid alcohol.  Rest as needed.  Contact a health care provider if:  You have new symptoms.  You cough up pus.  Your cough does not get better after 2-3 weeks, or your cough gets worse.  You cannot control your cough with suppressant medicines and you are losing sleep.  You develop pain that is getting worse or pain that is not controlled with pain medicines.  You have a fever.  You have unexplained weight loss.  You have night sweats. Get help right away if:  You cough up blood.  You have difficulty breathing.  Your heartbeat is very fast. This information is not intended to replace advice given to you by your health care provider. Make sure you  discuss any questions you have with your health care provider. Document Released: 08/07/2010 Document Revised: 07/17/2015 Document Reviewed: 04/17/2014 Elsevier Interactive Patient Education  2017 [place] you healthy  Get these tests  Blood pressure- Have your blood pressure checked once a year by your healthcare provider.  Normal blood pressure is 120/80  Weight- Have your body mass index (BMI) calculated to screen for obesity.  BMI is a measure of body fat based on height and weight. You can also calculate your own BMI at [email or web address].  Cholesterol- Have your cholesterol checked every year.  Diabetes- Have your blood sugar checked regularly if you have high blood pressure, high cholesterol, have a family history of diabetes or if you are overweight.  Screening for Colon Cancer- Colonoscopy starting at age 28.  Screening may begin sooner depending on your family history and other health conditions. Follow up colonoscopy as directed by  your Gastroenterologist.  Screening for Prostate Cancer- Both blood work (PSA) and a rectal exam help screen for Prostate Cancer.  Screening begins at age 47 with African-American men and at age 52 with Caucasian men.  Screening may begin sooner depending on your family history.  Take these medicines  Aspirin- One aspirin daily can help prevent Heart disease and Stroke.  Flu shot- Every fall.  Tetanus- Every 10 years.  Zostavax- Once after the age of 8 to prevent Shingles.  Pneumonia shot- Once after the age of 53; if you are younger than 82, ask your healthcare provider if you need a Pneumonia shot.  Take these steps  Don't smoke- If you do smoke, talk to your doctor about quitting.  For tips on how to quit, go to www.smokefree.gov or call 1-800-QUIT-NOW.  Be physically active- Exercise 5 days a week for at least 30 minutes.  If you are not already physically active start slow and gradually work up to 30 minutes of moderate physical activity.  Examples of moderate activity include walking briskly, mowing the yard, dancing, swimming, bicycling, etc.  Eat a healthy diet- Eat a variety of healthy food such as fruits, vegetables, low fat milk, low fat cheese, yogurt, lean meant, poultry, fish, beans, tofu, etc. For more information go to www.thenutritionsource.org  Drink alcohol in moderation- Limit alcohol intake to less than two drinks a day. Never drink and drive.  Dentist- Brush and floss twice daily; visit your dentist twice a year.  Depression- Your emotional health is as important as your physical health. If you're feeling down, or losing interest in things you would normally enjoy please talk to your healthcare provider.  Eye exam- Visit your eye doctor every year.  Safe sex- If you may be exposed to a sexually transmitted infection, use a condom.  Seat belts- Seat belts can save your life; always wear one.  Smoke/Carbon Monoxide detectors- These detectors need to be  installed on the appropriate level of your home.  Replace batteries at least once a year.  Skin cancer- When out in the sun, cover up and use sunscreen 15 SPF or higher.  Violence- If anyone is threatening you, please tell your healthcare provider.  Living Will/ Health care power of attorney- Speak with your healthcare provider and family.  IF you received an x-ray today, you will receive an invoice from [facility]. Please contact [facility] at [phone] with questions or concerns regarding your invoice.   IF you received labwork today, you will receive an invoice from [facility]. Please contact [facility] at [phone]  with questions or concerns regarding your invoice.   Our billing staff will not be able to assist you with questions regarding bills from these companies.  You will be contacted with the lab results as soon as they are available. The fastest way to get your results is to activate your My Chart account. Instructions are located on the last page of this paperwork. If you have not heard from [place] regarding the results in 2 weeks, please contact this office.

## 2016-12-20 ENCOUNTER — Other Ambulatory Visit: Payer: Self-pay | Admitting: *Deleted

## 2016-12-20 MED ORDER — LISINOPRIL-HYDROCHLOROTHIAZIDE 10-12.5 MG PO TABS: 1.0000 | ORAL_TABLET | Freq: Every day | ORAL | 3 refills | Status: DC

## 2016-12-20 NOTE — Telephone Encounter (Signed)
REFILL 

## 2016-12-30 ENCOUNTER — Ambulatory Visit (INDEPENDENT_AMBULATORY_CARE_PROVIDER_SITE_OTHER): Payer: PPO

## 2016-12-30 ENCOUNTER — Ambulatory Visit (INDEPENDENT_AMBULATORY_CARE_PROVIDER_SITE_OTHER): Payer: PPO | Admitting: Family Medicine

## 2016-12-30 ENCOUNTER — Encounter: Payer: Self-pay | Admitting: Family Medicine

## 2016-12-30 VITALS — BP 134/88 | HR 33 | Resp 18 | Ht 71.0 in | Wt 169.4 lb

## 2016-12-30 DIAGNOSIS — I739 Peripheral vascular disease, unspecified: Secondary | ICD-10-CM | POA: Diagnosis not present

## 2016-12-30 DIAGNOSIS — M19072 Primary osteoarthritis, left ankle and foot: Secondary | ICD-10-CM | POA: Diagnosis not present

## 2016-12-30 DIAGNOSIS — M25572 Pain in left ankle and joints of left foot: Secondary | ICD-10-CM

## 2016-12-30 DIAGNOSIS — L03119 Cellulitis of unspecified part of limb: Secondary | ICD-10-CM | POA: Diagnosis not present

## 2016-12-30 DIAGNOSIS — L97321 Non-pressure chronic ulcer of left ankle limited to breakdown of skin: Secondary | ICD-10-CM

## 2016-12-30 DIAGNOSIS — M533 Sacrococcygeal disorders, not elsewhere classified: Secondary | ICD-10-CM

## 2016-12-30 DIAGNOSIS — M40205 Unspecified kyphosis, thoracolumbar region: Secondary | ICD-10-CM

## 2016-12-30 DIAGNOSIS — M545 Low back pain, unspecified: Secondary | ICD-10-CM

## 2016-12-30 DIAGNOSIS — R05 Cough: Secondary | ICD-10-CM | POA: Diagnosis not present

## 2016-12-30 DIAGNOSIS — M47814 Spondylosis without myelopathy or radiculopathy, thoracic region: Secondary | ICD-10-CM | POA: Diagnosis not present

## 2016-12-30 DIAGNOSIS — R059 Cough, unspecified: Secondary | ICD-10-CM

## 2016-12-30 MED ORDER — SULFAMETHOXAZOLE-TRIMETHOPRIM 800-160 MG PO TABS: 1.0000 | ORAL_TABLET | Freq: Two times a day (BID) | ORAL | 0 refills | Status: DC

## 2016-12-30 NOTE — Patient Instructions (Addendum)
  I would still recommend coordinating treatment and next step with gait and balance with neurology. I would also discuss the medication that was stopped with neurologist.   If cough returns, please follow up for recheck.   Back x-ray does indicate some arthritis, and suspected chronic compression of the first lumbar vertebrae.  For back pain, tylenol up to 4 times per day - see maximum dosing on label. If pain is not improving in next week or two, I would recommend MRI or evaluation with specialist. Please let me know if a stronger medication as needed. If any fevers or acute worsening of pain, be seen right away.  Concern that there may be some component of circulation issues into the ankles and feet, and we'll refer you to a vascular specialist. The wound on the outside of the left ankle may be an abrasion, or less likely an ulcer from decreased circulation. Keep area clean with soap and water twice per day, start antibiotic Septra as it appears it may be trying to get infected,, cover with Vaseline and bandage, and recheck with me to examine that area in the next 4-5 days. Sooner if worse.      IF you received an x-ray today, you will receive an invoice from [facility]. Please contact [facility] at [phone] with questions or concerns regarding your invoice.   IF you received labwork today, you will receive an invoice from [facility]. Please contact [facility] at [phone] with questions or concerns regarding your invoice.   Our billing staff will not be able to assist you with questions regarding bills from these companies.  You will be contacted with the lab results as soon as they are available. The fastest way to get your results is to activate your My Chart account. Instructions are located on the last page of this paperwork. If you have not heard from [place] regarding the results in 2 weeks, please contact this office.

## 2016-12-30 NOTE — Progress Notes (Signed)
Subjective:  This chart was scribed for [name], MD by Tamsen Roers, at [place] at [facility].  This patient was seen in room 11 and the patient's care was started at 11:32 AM.   Chief Complaint  Patient presents with  . Follow-up    patient presents for follow up on cough, states that cough has subsided  . Ankle Pain    paer patient, outside of both ankles, also c/o back pain     Patient ID: Chad Russell, male    DOB: 1927-01-20, 83 y.o.   MRN: [id number]  HPI HPI Comments: Chad Russell is a 83 y.o. male who presents to Primary Care at [facility] for a follow up.  He has a history of multiple medical problems including hypertension, complete heart block, BPH status post TURP in July, LSA, and balance issues/gait difficulty- possible parkinsonism. ----Patient is not taking Sinemet as he feels like it did not help him (neurology has not been made aware of this).  He is not attending out- patient physical therapy (felt like it was "worthless") and has been going to the sauna at the  [facility].    Cough: He was treated for bronchitis on October 9th, Augmentin prescribed by Dr. Mitchel Honour for one week.  Chest x-ray- no active cardiopulmonary disease, stable calcified plural plaques bilaterally.  He had persistent cough that was worse at night when discussed October 25 th but also with some sinus drainage, no fever or chills.  Repeat chest x-ray on the 25 th- hyperexpansion but no acute disease. Recommended continued Mucin ex DM, Flonase nasal spray for congestion. ---- Patients cough has improved and states that it is "almost gone".  He is no longer using the Mucin-ex or Flonase nasal spray.    Back pain: See last visit, right sided lower back pain prior two weeks. Pain was improved with tylenol, 2 per day. No incontinence, no weakness. He had been treated by neuro for gait difficulty, there was hyperreflexia of lower extremities, concerned for lumbar stenosis. MRI LS spine was  discussed but declined. He had been referred to physical therapy but stopped due to the back pain. I also recommended MRI last visit but he planned to further discuss with neurology.----- Patient is taking extra strength tylenol- 2 per day (but does not feel like it is helping much).  He feels that his lower back pain has not changed since his last visit, states that it started a couple months ago.  He denies any pain when in a seated position and starts to feel it (on his right side) when he is moving. Patient had previous upper back pain which has now improved (states that it improved on its own with time).  He denies any pain or weakness in his legs but does feel unstable in his back while walking. Denies loss of control of his bladder or bowel movements, numbness when wiping, fevers/chills or night sweats.  Patient denies any recent injuries prior to his back pain.    Ankle pain: Patient has been having bilateral ankle pain (left worse than right) for the past month.  . Patient Active Problem List   Diagnosis Date Noted  . Chest congestion 11/30/2016  . Cough 11/30/2016  . Acute bronchitis 11/30/2016  . BPH with obstruction/lower urinary tract symptoms 09/07/2016  . Preoperative cardiovascular examination 08/24/2016  . Dysuria 05/13/2016  . Urinary retention 05/13/2016  . Nausea without vomiting 05/13/2016  . Pelvic pain 05/13/2016  . Balance problem 10/31/2013  .  OSA (obstructive sleep apnea) 10/31/2013  . BPH (benign prostatic hyperplasia) 08/28/2012  . Atrial fibrillation ([place]) 06/08/2012  . Bacteremia 04/28/2012  . Hyperplasia of prostate with lower urinary tract symptoms (LUTS) 04/28/2012  . Calf pain 04/28/2012  . Moderate malnutrition ([place]) 04/28/2012  . Influenza-like illness 04/27/2012  . Infection of urinary tract - recurrent 04/26/2012  . NSTEMI (non-ST elevated myocardial infarction) ([place]) 03/15/2011  . Complete heart block ([place]) 03/15/2011  . HTN (hypertension)  03/15/2011   Past Medical History:  Diagnosis Date  . Arthritis   . Atrial fibrillation ([place])   . Balance problem 10/31/2013  . BPH (benign prostatic hyperplasia) 2014   Urology Dr [name] with [facility] [phone]   . Bradycardia    a. baseline HR in the 30's. Asymptomatic - no history of PPM placement.   Marland Kitchen CAD (coronary artery disease)    LHC 03/16/11: Mid LAD 10-20%, proximal circumflex 10%, mid RCA 20%, EF 55%.  . Cataract   . Complete heart block ([place])   . GERD (gastroesophageal reflux disease)    takes occasional  zantac  . Hx of echocardiogram    Echo 02/2011: EF 65-70%, normal wall motion, MAC, mild MR, mild LAE, mild RVE  . Hypertension   . OSA (obstructive sleep apnea)    per patient went to have sleep study done about 2 years ago, bu they never F/U with him about results; but wife reports he still has periods of apnea at night   . Talar fracture    casted no surg   Past Surgical History:  Procedure Laterality Date  . COLONOSCOPY     Repeated and normal in 2011  . hemmorhoidectomy    . PROSTATE BIOPSY     negative - [facility]   Allergies  Allergen Reactions  . Doxycycline     Upset stomach  . Keflex [Cephalexin]     Abdominal disruption   Prior to Admission medications   Medication Sig Start Date End Date Taking? Authorizing Provider  Calcium Carbonate Antacid (ALKA-SELTZER ANTACID PO) Take by mouth.    [provider]  carbidopa-levodopa (SINEMET IR) 25-100 MG tablet Take 1 tab at 8:30am, 1 tablet at 1pm, and 1 tablet at 6pm. 11/18/16   Patel, Arvin Collard K, DO  Docusate Sodium (COLACE PO) Take 1 tablet by mouth at bedtime as needed (constipation).     [provider]  finasteride (PROSCAR) 5 MG tablet Take 5 mg by mouth daily.     [provider]  lisinopril-hydrochlorothiazide (PRINZIDE,ZESTORETIC) 10-12.5 MG tablet Take 1 tablet by mouth daily. 12/20/16   Minus Breeding, MD  LUTEIN-ZEAXANTHIN PO Take 1 tablet by mouth at bedtime.     [provider]  Multiple Vitamins-Minerals (PRESERVISION AREDS 2+MULTI VIT PO) Take 1 tablet by mouth 2 (two) times daily.    [provider]  POLYETHYLENE GLYCOL 3350 PO Take 17 g by mouth daily.    [provider]  Probiotic Product (PROBIOTIC DAILY PO) Take 1 tablet by mouth daily.    [provider]  timolol (TIMOPTIC) 0.5 % ophthalmic solution Place 1 drop into both eyes at bedtime.  12/04/15   [provider]  Turmeric (CURCUMIN 95) 500 MG CAPS Take 500 mg by mouth daily.    [provider]  vitamin C (ASCORBIC ACID) 500 MG tablet Take 500 mg by mouth daily.    [provider]  Alveda Reasons 20 MG TABS tablet  11/12/16   [provider]   Social History  Socioeconomic History  . Marital status: Married    Spouse name: Mardene Celeste  . Number of children: 0  . Years of education: 57  . Highest education level: Not on file  Social Needs  . Financial resource strain: Not on file  . Food insecurity - worry: Not on file  . Food insecurity - inability: Not on file  . Transportation needs - medical: Not on file  . Transportation needs - non-medical: Not on file  Occupational History  . Occupation: Scientist, product/process development  . Occupation: Civil engineer, contracting  Tobacco Use  . Smoking status: Former Smoker    Packs/day: 1.00    Years: 3.00    Pack years: 3.00    Types: Cigarettes    Last attempt to quit: 03/17/1955    Years since quitting: 61.8  . Smokeless tobacco: Former Network engineer and Sexual Activity  . Alcohol use: Yes    Alcohol/week: 8.4 oz    Types: 7 Cans of beer, 7 Standard drinks or equivalent per week    Comment: daily  . Drug use: No  . Sexual activity: No    Birth control/protection: None  Other Topics Concern  . 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  Has no children.        Review of Systems  Constitutional: Negative for chills and fever.  Eyes: Negative for pain, redness and itching.  Respiratory: Negative for cough, choking and shortness of breath.   Gastrointestinal: Negative for blood in stool, nausea and vomiting.  Genitourinary: Negative for difficulty urinating and frequency.  Musculoskeletal: Positive for back pain.  Skin: Positive for wound.  Neurological: Negative for seizures and speech difficulty.       Objective:   Physical Exam  Constitutional: He is oriented to person, place, and time. No distress.  HENT:  Head: Normocephalic and atraumatic.  Cardiovascular:  Bradycardic but regular.   Pulmonary/Chest:  Few lower coarse breath sounds, overall clear.   Musculoskeletal:  Tenderness over the right SI joint. Some thoracic kyphosis.   Neurological: He is alert and oriented to person, place, and time.  Skin:  Left: slight purple hue to the foot with cool toes, cap refill 1-2 seconds. Difficulty palpating DP pulse.   Prominent lateral malleolus, slight erythema surrounding a shallow ulcer or abrasion (measuring at 1 cm with surrounding erythema 3 by 3 cm) do not see any exudate expressed.  Right: Prominent lateral malleolus- skin is intact, no erythema.  Minimal tenderness on the right. Slight purple hue to his feet, slightly cool toes. Cap refill 1-2 seconds. Difficulty palpating DP pulse, there are some varicose veins in the foot and ankle.   Psychiatric: He has a normal mood and affect. His behavior is normal.   Vitals:   12/30/16 1113  BP: 134/88  Pulse: (!) 33  Resp: 18  SpO2: 100%  Weight: 169 lb 6.4 oz (76.8 kg)  Height: 5' 11"  (1.803 m)  History of asymptomatic heart block.      Assessment & Plan:   Chad Russell is a 83 y.o. male Ankle ulcer, left, limited to breakdown of skin ([place]) - Plan: Ambulatory referral to Vascular Surgery, sulfamethoxazole-trimethoprim  (BACTRIM DS,SEPTRA DS) 800-160 MG tablet, WOUND CULTURE PVD (peripheral vascular disease) ([place]) - Plan:  Ambulatory referral to Vascular Surgery Left ankle pain, unspecified chronicity - Plan: DG Ankle Complete Left Cellulitis of ankle - Plan: sulfamethoxazole-trimethoprim (BACTRIM DS,SEPTRA DS) 800-160 MG tablet, WOUND CULTURE  - Abrasion with ulcer versus less likely insufficiency ulcer. Some increase in redness recently, possible early cellulitis.  -Due to doxycycline and Keflex allergies, started on Septra DS twice a day, wound culture obtained. Potential side effects discussed, RTC precautions  -Referred to vascular surgery as suspect some peripheral arterial disease.  -Recheck 4 days   Acute right-sided low back pain without sciatica Pain of right sacroiliac joint - Plan: DG Lumbar Spine Complete, DG Si Joints Kyphosis of thoracolumbar region, unspecified kyphosis type - Plan: DG Thoracic Spine 2 View  -Suspected remote compression fracture, pain is lower right side at SI joint. No red flags on history or exam at present, but suspect he does have some component of DDD and possible lumbar stenosis. MRI has been discussed previously.  -Okay to continue Tylenol for now, but if pain is not improving within the next week to 2 weeks, would recommend MRI lumbar spine possible orthopedic evaluation as well. RTC precautions if worse sooner  Cough  -Improved. RTC precautions if any recurrence  Meds ordered this encounter  Medications  . sulfamethoxazole-trimethoprim (BACTRIM DS,SEPTRA DS) 800-160 MG tablet    Sig: Take 1 tablet 2 (two) times daily by mouth.    Dispense:  14 tablet    Refill:  0   Patient Instructions    I would still recommend coordinating treatment and next step with gait and balance with neurology. I would also discuss the medication that was stopped with neurologist.   If cough returns, please follow up for recheck.   Back x-ray does indicate some arthritis, and  suspected chronic compression of the first lumbar vertebrae.  For back pain, tylenol up to 4 times per day - see maximum dosing on label. If pain is not improving in next week or two, I would recommend MRI or evaluation with specialist. Please let me know if a stronger medication as needed. If any fevers or acute worsening of pain, be seen right away.  Concern that there may be some component of circulation issues into the ankles and feet, and we'll refer you to a vascular specialist. The wound on the outside of the left ankle may be an abrasion, or less likely an ulcer from decreased circulation. Keep area clean with soap and water twice per day, start antibiotic Septra as it appears it may be trying to get infected,, cover with Vaseline and bandage, and recheck with me to examine that area in the next 4-5 days. Sooner if worse.      IF you received an x-ray today, you will receive an invoice from [facility]. Please contact [facility] at [phone] with questions or concerns regarding your invoice.   IF you received labwork today, you will receive an invoice from [facility]. Please contact [facility] at [phone] with questions or concerns regarding your invoice.   Our billing staff will not be able to assist you with questions regarding bills from these companies.  You will be contacted with the lab results as soon as they are available. The fastest way to get your results is to activate your My Chart account. Instructions are located on the last page of this paperwork. If you have not heard from [place] regarding the results in 2 weeks, please contact this office.      I personally performed the services described  in this documentation, which was scribed in my presence. The recorded information has been reviewed and considered for accuracy and completeness, addended by me as needed, and agree with information above.  Signed,   [name], MD Primary Care at [facility].  12/30/16 1:32 PM

## 2017-01-01 LAB — WOUND CULTURE: ORGANISM ID, BACTERIA: NONE SEEN

## 2017-01-03 ENCOUNTER — Other Ambulatory Visit: Payer: Self-pay

## 2017-01-03 ENCOUNTER — Ambulatory Visit (INDEPENDENT_AMBULATORY_CARE_PROVIDER_SITE_OTHER): Payer: PPO | Admitting: Family Medicine

## 2017-01-03 ENCOUNTER — Encounter: Payer: Self-pay | Admitting: Family Medicine

## 2017-01-03 VITALS — BP 138/68 | HR 34 | Resp 18 | Ht 71.0 in | Wt 170.0 lb

## 2017-01-03 DIAGNOSIS — M545 Low back pain: Secondary | ICD-10-CM

## 2017-01-03 DIAGNOSIS — S91002D Unspecified open wound, left ankle, subsequent encounter: Secondary | ICD-10-CM | POA: Diagnosis not present

## 2017-01-03 DIAGNOSIS — I70249 Atherosclerosis of native arteries of left leg with ulceration of unspecified site: Secondary | ICD-10-CM

## 2017-01-03 NOTE — Progress Notes (Signed)
Subjective:  This chart was scribed for Chad Agreste, MD by Chad Russell, at [place] at [facility].  This patient was seen in room 9 and the patient's care was started at 9:00 AM.    Chief Complaint  Patient presents with  . Follow-up    ankle wound     Patient ID: Chad Russell, male    DOB: 1926/07/06, 83 y.o.   MRN: [id number]   HPI HPI Comments: Chad Russell is a 83 y.o. male who presents to Primary Care at [facility] for a follow up of an ankle ulcer/wound noted at November 8th visit. He had been experiencing bilateral ankle pain for the past month.  He did have some possible signs of peripheral vascular disease with somewhat pale toes.  He was started on Septra DS for a ulcer/wound on the lateral ankle due to his other allergies.  Unsure if this was an abrasion or early vascular ulcer with secondary infection.  --- Patient has been compliant with his antibiotics and denies any side effects.  He has taken one this morning and has 6 tablets left.    Right lower back pain: see last visit. Plan for MRI if not improving within the next 1-2 weeks. --- Patient states that his back pain has improved significantly.    Patient Active Problem List   Diagnosis Date Noted  . Chest congestion 11/30/2016  . Cough 11/30/2016  . Acute bronchitis 11/30/2016  . BPH with obstruction/lower urinary tract symptoms 09/07/2016  . Preoperative cardiovascular examination 08/24/2016  . Dysuria 05/13/2016  . Urinary retention 05/13/2016  . Nausea without vomiting 05/13/2016  . Pelvic pain 05/13/2016  . Balance problem 10/31/2013  . OSA (obstructive sleep apnea) 10/31/2013  . BPH (benign prostatic hyperplasia) 08/28/2012  . Atrial fibrillation ([place]) 06/08/2012  . Bacteremia 04/28/2012  . Hyperplasia of prostate with lower urinary tract symptoms (LUTS) 04/28/2012  . Calf pain 04/28/2012  . Moderate malnutrition ([place]) 04/28/2012  . Influenza-like illness 04/27/2012  . Infection of  urinary tract - recurrent 04/26/2012  . NSTEMI (non-ST elevated myocardial infarction) ([place]) 03/15/2011  . Complete heart block ([place]) 03/15/2011  . HTN (hypertension) 03/15/2011   Past Medical History:  Diagnosis Date  . Arthritis   . Atrial fibrillation ([place])   . Balance problem 10/31/2013  . BPH (benign prostatic hyperplasia) 2014   Urology Dr [name] with [facility] [phone]   . Bradycardia    a. baseline HR in the 30's. Asymptomatic - no history of PPM placement.   Mar[place] Kitchen CAD (coronary artery disease)    LHC 03/16/11: Mid LAD 10-20%, proximal circumflex 10%, mid RCA 20%, EF 55%.  . Cataract   . Complete heart block ([place])   . GERD (gastroesophageal reflux disease)    takes occasional  zantac  . Hx of echocardiogram    Echo 02/2011: EF 65-70%, normal wall motion, MAC, mild MR, mild LAE, mild RVE  . Hypertension   . OSA (obstructive sleep apnea)    per patient went to have sleep study done about 2 years ago, bu they never F/U with him about results; but wife reports he still has periods of apnea at night   . Talar fracture    casted no surg   Past Surgical History:  Procedure Laterality Date  . COLONOSCOPY     Repeated and normal in 2011  . hemmorhoidectomy    . PROSTATE BIOPSY     negative - [facility]   Allergies  Allergen Reactions  .  Doxycycline     Upset stomach  . Keflex [Cephalexin]     Abdominal disruption   Prior to Admission medications   Medication Sig Start Date End Date Taking? Authorizing Provider  Calcium Carbonate Antacid (ALKA-SELTZER ANTACID PO) Take by mouth.    [provider]  carbidopa-levodopa (SINEMET IR) 25-100 MG tablet Take 1 tab at 8:30am, 1 tablet at 1pm, and 1 tablet at 6pm. 11/18/16   Patel, Arvin Collard K, DO  Docusate Sodium (COLACE PO) Take 1 tablet by mouth at bedtime as needed (constipation).     [provider]  finasteride (PROSCAR) 5 MG tablet Take 5 mg by mouth daily.     [provider]    lisinopril-hydrochlorothiazide (PRINZIDE,ZESTORETIC) 10-12.5 MG tablet Take 1 tablet by mouth daily. 12/20/16   Minus Breeding, MD  LUTEIN-ZEAXANTHIN PO Take 1 tablet by mouth at bedtime.    [provider]  Multiple Vitamins-Minerals (PRESERVISION AREDS 2+MULTI VIT PO) Take 1 tablet by mouth 2 (two) times daily.    [provider]  POLYETHYLENE GLYCOL 3350 PO Take 17 g by mouth daily.    [provider]  Probiotic Product (PROBIOTIC DAILY PO) Take 1 tablet by mouth daily.    [provider]  sulfamethoxazole-trimethoprim (BACTRIM DS,SEPTRA DS) 800-160 MG tablet Take 1 tablet 2 (two) times daily by mouth. 12/30/16   Chad Agreste, MD  timolol (TIMOPTIC) 0.5 % ophthalmic solution Place 1 drop into both eyes at bedtime.  12/04/15   [provider]  Turmeric (CURCUMIN 95) 500 MG CAPS Take 500 mg by mouth daily.    [provider]  vitamin C (ASCORBIC ACID) 500 MG tablet Take 500 mg by mouth daily.    [provider]  Alveda Reasons 20 MG TABS tablet  11/12/16   [provider]   Social History   Socioeconomic History  . Marital status: Married    Spouse name: Chad Russell  . Number of children: 0  . Years of education: 58  . Highest education level: Not on file  Social Needs  . Financial resource strain: Not on file  . Food insecurity - worry: Not on file  . Food insecurity - inability: Not on file  . Transportation needs - medical: Not on file  . Transportation needs - non-medical: Not on file  Occupational History  . Occupation: Scientist, product/process development  . Occupation: Civil engineer, contracting  Tobacco Use  . Smoking status: Former Smoker    Packs/day: 1.00    Years: 3.00    Pack years: 3.00    Types: Cigarettes    Last attempt to quit: 03/17/1955    Years since quitting: 61.8  . Smokeless tobacco: Former Network engineer and Sexual Activity  . Alcohol use: Yes    Alcohol/week: 8.4 oz    Types: 7 Cans of beer, 7 Standard drinks or  equivalent per week    Comment: daily  . Drug use: No  . Sexual activity: No    Birth control/protection: None  Other Topics Concern  . 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  Has no children.      Review of Systems  Constitutional: Negative for chills and fever.  Eyes: Negative for pain, redness and itching.  Respiratory: Negative for cough,  choking and shortness of breath.   Gastrointestinal: Negative for nausea and vomiting.  Skin: Positive for wound.       Objective:   Physical Exam  Constitutional: He is oriented to person, place, and time. He appears well-developed and well-nourished. No distress.  HENT:  Head: Normocephalic and atraumatic.  Pulmonary/Chest: Effort normal. No respiratory distress.  Neurological: He is alert and oriented to person, place, and time.  Skin:  Left foot: Pale, Slightly cyanotic toes, cap refill approximately two seconds. There is some dry skin but no appearing ulcers on the feet.  Lateral ankle: he has a shallow ulcer/abrated area measuring 13 by 5 mm on his left ankle at the malleolus, no surrounding warmth or significant erythema, small eschar forming, very minimal tenderness over the lateral malleolus.   Psychiatric: He has a normal mood and affect. His behavior is normal.   Vitals:   01/03/17 0828  BP: 138/68  Pulse: (!) 34  Resp: 18  SpO2: 97%  Weight: 170 lb (77.1 kg)  Height: _0  (1.803 m)   Results for orders placed or performed in visit on 12/30/16  WOUND CULTURE  Result Value Ref Range   Gram Stain Result Final report    Organism ID, Bacteria Comment    Organism ID, Bacteria No organisms seen    Aerobic Bacterial Culture Final report    Organism ID, Bacteria Mixed skin flora       Assessment & Plan:  Chad Russell is a 83  y.o. male Wound of left ankle, subsequent encounter  -Stable, wound culture reassuring. Tolerating antibiotic, okay to finish the antibiotic at this point but if any new side effects, stop it and continue local wound care.  - Planning for vascular surgery eval for PVD as vascular ulcer still in differential.  - Recheck 1 week if not improving, sooner if worse  Right low back pain, unspecified chronicity, with sciatica presence unspecified  - Improved. Still discussed potential MRI to look into other causes of pain as well as potential cause for balance/gait abnormality.  No orders of the defined types were placed in this encounter.  Patient Instructions    You should be receiving a call from the vascular specialist soon. If you have not heard from them within the next 1 week, let me know.  The wound on your left ankle appears to be improving. Keep clean that area with soap and water at least once per day, Polysporin antibiotic ointment and bandage. If it is not significantly improving or shrinking by next Monday, please return for recheck. Return sooner if worse  IF you received an x-ray today, you will receive an invoice from [facility]. Please contact [facility] at [phone] with questions or concerns regarding your invoice.   IF you received labwork today, you will receive an invoice from [place]. Please contact [facility] at [phone] with questions or concerns regarding your invoice.   Our billing staff will not be able to assist you with questions regarding bills from these companies.  You will be contacted with the lab results as soon as they are available. The fastest way to get your results is to activate your My Chart account. Instructions are located on the last page of this paperwork. If you have not heard from [place] regarding the results in 2 weeks, please contact this office.      I personally performed the services described in this documentation,  which was scribed in my presence. The recorded information has been  reviewed and considered for accuracy and completeness, addended by me as needed, and agree with information above.  Signed,   [name], MD Primary Care at [place].  01/03/17 9:14 AM

## 2017-01-03 NOTE — Patient Instructions (Addendum)
  You should be receiving a call from the vascular specialist soon. If you have not heard from them within the next 1 week, let me know.  The wound on your left ankle appears to be improving. Keep clean that area with soap and water at least once per day, Polysporin antibiotic ointment and bandage. If it is not significantly improving or shrinking by next Monday, please return for recheck. Return sooner if worse  IF you received an x-ray today, you will receive an invoice from [facility]. Please contact [facility] at [phone] with questions or concerns regarding your invoice.   IF you received labwork today, you will receive an invoice from [facility]. Please contact [facility] at [phone] with questions or concerns regarding your invoice.   Our billing staff will not be able to assist you with questions regarding bills from these companies.  You will be contacted with the lab results as soon as they are available. The fastest way to get your results is to activate your My Chart account. Instructions are located on the last page of this paperwork. If you have not heard from [place] regarding the results in 2 weeks, please contact this office.

## 2017-01-04 ENCOUNTER — Telehealth: Payer: Self-pay

## 2017-01-04 ENCOUNTER — Ambulatory Visit: Payer: PPO | Admitting: Hematology and Oncology

## 2017-01-04 ENCOUNTER — Encounter: Payer: Self-pay | Admitting: Hematology and Oncology

## 2017-01-04 ENCOUNTER — Ambulatory Visit (HOSPITAL_COMMUNITY)
Admission: RE | Admit: 2017-01-04 | Discharge: 2017-01-04 | Disposition: A | Discharge status: Home / Self Care | Payer: PPO | Source: Ambulatory Visit | Attending: Vascular Surgery | Admitting: Vascular Surgery

## 2017-01-04 ENCOUNTER — Ambulatory Visit (HOSPITAL_BASED_OUTPATIENT_CLINIC_OR_DEPARTMENT_OTHER): Payer: PPO

## 2017-01-04 VITALS — BP 145/60 | HR 40 | Resp 18 | Ht 71.0 in | Wt 170.0 lb

## 2017-01-04 DIAGNOSIS — I70243 Atherosclerosis of native arteries of left leg with ulceration of ankle: Secondary | ICD-10-CM | POA: Diagnosis not present

## 2017-01-04 DIAGNOSIS — I4891 Unspecified atrial fibrillation: Secondary | ICD-10-CM | POA: Diagnosis not present

## 2017-01-04 DIAGNOSIS — I10 Essential (primary) hypertension: Secondary | ICD-10-CM | POA: Diagnosis not present

## 2017-01-04 DIAGNOSIS — D472 Monoclonal gammopathy: Secondary | ICD-10-CM

## 2017-01-04 DIAGNOSIS — G620 Drug-induced polyneuropathy: Secondary | ICD-10-CM

## 2017-01-04 DIAGNOSIS — I70249 Atherosclerosis of native arteries of left leg with ulceration of unspecified site: Secondary | ICD-10-CM

## 2017-01-04 DIAGNOSIS — R609 Edema, unspecified: Secondary | ICD-10-CM

## 2017-01-04 DIAGNOSIS — Z87891 Personal history of nicotine dependence: Secondary | ICD-10-CM | POA: Diagnosis not present

## 2017-01-04 DIAGNOSIS — I739 Peripheral vascular disease, unspecified: Secondary | ICD-10-CM

## 2017-01-04 LAB — CBC & DIFF AND RETIC
BASO%: 0.3 % (ref 0.0–2.0)
Basophils Absolute: 0 10*3/uL (ref 0.0–0.1)
EOS ABS: 0.1 10*3/uL (ref 0.0–0.5)
EOS%: 0.9 % (ref 0.0–7.0)
HCT: 37.8 % — ABNORMAL LOW (ref 38.4–49.9)
HGB: 12.3 g/dL — ABNORMAL LOW (ref 13.0–17.1)
IMMATURE RETIC FRACT: 4.3 % (ref 3.00–10.60)
LYMPH#: 1.6 10*3/uL (ref 0.9–3.3)
LYMPH%: 27.2 % (ref 14.0–49.0)
MCH: 31.1 pg (ref 27.2–33.4)
MCHC: 32.5 g/dL (ref 32.0–36.0)
MCV: 95.5 fL (ref 79.3–98.0)
MONO#: 0.5 10*3/uL (ref 0.1–0.9)
MONO%: 9.4 % (ref 0.0–14.0)
NEUT%: 62.2 % (ref 39.0–75.0)
NEUTROS ABS: 3.6 10*3/uL (ref 1.5–6.5)
NRBC: 0 % (ref 0–0)
PLATELETS: 318 10*3/uL (ref 140–400)
RBC: 3.96 10*6/uL — AB (ref 4.20–5.82)
RDW: 14.4 % (ref 11.0–14.6)
RETIC %: 1.03 % (ref 0.80–1.80)
RETIC CT ABS: 40.79 10*3/uL (ref 34.80–93.90)
WBC: 5.8 10*3/uL (ref 4.0–10.3)

## 2017-01-04 LAB — COMPREHENSIVE METABOLIC PANEL
ALT: 18 U/L (ref 0–55)
AST: 25 U/L (ref 5–34)
Albumin: 4 g/dL (ref 3.5–5.0)
Alkaline Phosphatase: 58 U/L (ref 40–150)
Anion Gap: 8 mEq/L (ref 3–11)
BILIRUBIN TOTAL: 0.46 mg/dL (ref 0.20–1.20)
BUN: 20.7 mg/dL (ref 7.0–26.0)
CHLORIDE: 105 meq/L (ref 98–109)
CO2: 26 mEq/L (ref 22–29)
CREATININE: 1.2 mg/dL (ref 0.7–1.3)
Calcium: 9.8 mg/dL (ref 8.4–10.4)
EGFR: 56 mL/min/{1.73_m2} — ABNORMAL LOW (ref >60)
GLUCOSE: 99 mg/dL (ref 70–140)
Potassium: 4.3 mEq/L (ref 3.5–5.1)
SODIUM: 139 meq/L (ref 136–145)
TOTAL PROTEIN: 7.4 g/dL (ref 6.4–8.3)

## 2017-01-04 LAB — LACTATE DEHYDROGENASE: LDH: 199 U/L (ref 125–245)

## 2017-01-04 NOTE — Telephone Encounter (Signed)
Printed avs and calender for upcoming appointment. Per 11/13 los

## 2017-01-05 LAB — KAPPA/LAMBDA LIGHT CHAINS
IG LAMBDA FREE LIGHT CHAIN: 17.4 mg/L (ref 5.7–26.3)
Ig Kappa Free Light Chain: 22.9 mg/L — ABNORMAL HIGH (ref 3.3–19.4)
Kappa/Lambda FluidC Ratio: 1.32 (ref 0.26–1.65)

## 2017-01-05 LAB — ANTINUCLEAR ANTIBODIES, IFA: ANTINUCLEAR ANTIBODIES, IFA: NEGATIVE

## 2017-01-05 LAB — BETA 2 MICROGLOBULIN, SERUM: Beta-2: 1.9 mg/L (ref 0.6–2.4)

## 2017-01-05 LAB — RHEUMATOID FACTOR

## 2017-01-06 ENCOUNTER — Ambulatory Visit: Payer: PPO | Admitting: Vascular Surgery

## 2017-01-06 ENCOUNTER — Encounter: Payer: Self-pay | Admitting: Vascular Surgery

## 2017-01-06 ENCOUNTER — Other Ambulatory Visit: Payer: Self-pay

## 2017-01-06 VITALS — BP 140/76 | HR 60 | Temp 97.1°F | Resp 16 | Ht 71.0 in | Wt 166.0 lb

## 2017-01-06 DIAGNOSIS — L97909 Non-pressure chronic ulcer of unspecified part of unspecified lower leg with unspecified severity: Secondary | ICD-10-CM | POA: Diagnosis not present

## 2017-01-06 DIAGNOSIS — I70299 Other atherosclerosis of native arteries of extremities, unspecified extremity: Secondary | ICD-10-CM

## 2017-01-06 LAB — MULTIPLE MYELOMA PANEL, SERUM
ALBUMIN SERPL ELPH-MCNC: 4 g/dL (ref 2.9–4.4)
ALPHA 1: 0.3 g/dL (ref 0.0–0.4)
Albumin/Glob SerPl: 1.5 (ref 0.7–1.7)
Alpha2 Glob SerPl Elph-Mcnc: 0.6 g/dL (ref 0.4–1.0)
B-Globulin SerPl Elph-Mcnc: 0.8 g/dL (ref 0.7–1.3)
GAMMA GLOB SERPL ELPH-MCNC: 1.1 g/dL (ref 0.4–1.8)
GLOBULIN, TOTAL: 2.8 g/dL (ref 2.2–3.9)
IGA/IMMUNOGLOBULIN A, SERUM: 231 mg/dL (ref 61–437)
IgG, Qn, Serum: 1144 mg/dL (ref 700–1600)
IgM, Qn, Serum: 49 mg/dL (ref 15–143)
M PROTEIN SERPL ELPH-MCNC: 0.3 g/dL — AB
Total Protein: 6.8 g/dL (ref 6.0–8.5)

## 2017-01-06 NOTE — Progress Notes (Signed)
Patient name: Chad Russell MRN: [id number] DOB: 1926/09/09 Sex: male   REASON FOR CONSULT:    Left ankle ulcer.  The consult is requested by Dr. Carlota Raspberry.   HPI:   Chad Russell is a pleasant 83 y.o. male, who developed a wound on his left lateral malleolus approximately 3 weeks ago.  He does not remember any specific injury to the ankle.  This has been slow to heal.  I reviewed the office note from the referring office.  The patient was seen on 01/03/2017 for continued follow-up of a left ankle ulcer.  The patient has had some bilateral ankle pain for the last month.  He was started on Bactrim for the wound on the lateral ankle.  Patient is referred for vascular consultation given persistent wound.  Patient activity is limited but he denies any claudication.  He denies any history of rest pain.  He has been very active when he was younger.  He is on Xarelto for atrial fibrillation.  His risk factors for peripheral vascular disease or hypertension.  He denies any history of diabetes, hypercholesterolemia, family history of premature cardiovascular disease or tobacco use.  Past Medical History:  Diagnosis Date  . Arthritis   . Atrial fibrillation ([place])   . Balance problem 10/31/2013  . BPH (benign prostatic hyperplasia) 2014   Urology Dr [name] with [facility] [phone]   . Bradycardia    a. baseline HR in the 30's. Asymptomatic - no history of PPM placement.   Marland Kitchen CAD (coronary artery disease)    LHC 03/16/11: Mid LAD 10-20%, proximal circumflex 10%, mid RCA 20%, EF 55%.  . Cataract   . Complete heart block ([place])   . GERD (gastroesophageal reflux disease)    takes occasional  zantac  . Hx of echocardiogram    Echo 02/2011: EF 65-70%, normal wall motion, MAC, mild MR, mild LAE, mild RVE  . Hypertension   . OSA (obstructive sleep apnea)    per patient went to have sleep study done about 2 years ago, bu they never F/U with him about results; but wife reports he still has periods  of apnea at night   . Talar fracture    casted no surg    Family History  Problem Relation Age of Onset  . Hypertension Mother   . Multiple sclerosis Sister     SOCIAL HISTORY: Social History   Socioeconomic History  . Marital status: Married    Spouse name: Mardene Celeste  . Number of children: 0  . Years of education: 62  . Highest education level: Not on file  Social Needs  . Financial resource strain: Not on file  . Food insecurity - worry: Not on file  . Food insecurity - inability: Not on file  . Transportation needs - medical: Not on file  . Transportation needs - non-medical: Not on file  Occupational History  . Occupation: Scientist, product/process development  . Occupation: Civil engineer, contracting  Tobacco Use  . Smoking status: Former Smoker    Packs/day: 1.00    Years: 3.00    Pack years: 3.00    Types: Cigarettes    Last attempt to quit: 03/17/1955    Years since quitting: 61.8  . Smokeless tobacco: Former Network engineer and Sexual Activity  . Alcohol use: Yes    Alcohol/week: 8.4 oz    Types: 7 Cans of beer, 7 Standard drinks or equivalent per week    Comment: daily  . Drug use: No  .  Sexual activity: No    Birth control/protection: None  Other Topics Concern  . 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  Has no children.      Allergies  Allergen Reactions  . Doxycycline     Upset stomach  . Keflex [Cephalexin]     Abdominal disruption    Current Outpatient Medications  Medication Sig Dispense Refill  . Calcium Carbonate Antacid (ALKA-SELTZER ANTACID PO) Take by mouth.    . carbidopa-levodopa (SINEMET IR) 25-100 MG tablet Take 1 tab at 8:30am, 1 tablet at 1pm, and 1 tablet at 6pm. 90 tablet 5  . Docusate Sodium (COLACE PO) Take 1 tablet by mouth at bedtime as needed (constipation).     Marland Kitchen  lisinopril-hydrochlorothiazide (PRINZIDE,ZESTORETIC) 10-12.5 MG tablet Take 1 tablet by mouth daily. 90 tablet 3  . LUTEIN-ZEAXANTHIN PO Take 1 tablet by mouth at bedtime.    . Multiple Vitamins-Minerals (PRESERVISION AREDS 2+MULTI VIT PO) Take 1 tablet by mouth 2 (two) times daily.    . Probiotic Product (PROBIOTIC DAILY PO) Take 1 tablet by mouth daily.    . timolol (TIMOPTIC) 0.5 % ophthalmic solution Place 1 drop into both eyes at bedtime.     . Turmeric (CURCUMIN 95) 500 MG CAPS Take 500 mg by mouth daily.    . vitamin C (ASCORBIC ACID) 500 MG tablet Take 500 mg by mouth daily.    Alveda Reasons 20 MG TABS tablet     . finasteride (PROSCAR) 5 MG tablet Take 5 mg by mouth daily.     Marland Kitchen POLYETHYLENE GLYCOL 3350 PO Take 17 g by mouth daily.    Marland Kitchen sulfamethoxazole-trimethoprim (BACTRIM DS,SEPTRA DS) 800-160 MG tablet Take 1 tablet 2 (two) times daily by mouth. (Patient not taking: Reported on 01/06/2017) 14 tablet 0   No current facility-administered medications for this visit.     REVIEW OF SYSTEMS:  _0  denotes positive finding, _1  denotes negative finding Cardiac  Comments:  Chest pain or chest pressure:    Shortness of breath upon exertion:    Short of breath when lying flat:    Irregular heart rhythm:        Vascular    Pain in calf, thigh, or hip brought on by ambulation:    Pain in feet at night that wakes you up from your sleep:  X   Blood clot in your veins:    Leg swelling:         Pulmonary    Oxygen at home:    Productive cough:     Wheezing:         Neurologic    Sudden weakness in arms or legs:     Sudden numbness in arms or legs:     Sudden onset of difficulty speaking or slurred speech:    Temporary loss of vision in one eye:     Problems with dizziness:         Gastrointestinal    Blood in stool:     Vomited blood:         Genitourinary    Burning when urinating:     Blood in urine:        Psychiatric    Major depression:  Hematologic      Bleeding problems:    Problems with blood clotting too easily:        Skin    Rashes or ulcers:        Constitutional    Fever or chills:     PHYSICAL EXAM:   Vitals:   01/06/17 1602  BP: 140/76  Pulse: 60  Resp: 16  Temp: (!) 97.1 F (36.2 C)  TempSrc: Oral  SpO2: 97%  Weight: 166 lb (75.3 kg)  Height: _0  (1.803 m)    GENERAL: The patient is a well-nourished male, in no acute distress. The vital signs are documented above. CARDIAC: There is a regular rate and rhythm.  VASCULAR: I do not detect carotid bruits. On the left side which is the site of concern, he has a palpable femoral and popliteal pulse.  In addition he has a posterior tibial pulse.  I cannot obtain a dorsalis pedis pulse. On the right side he has a palpable femoral and popliteal pulse.  I cannot palpate pedal pulses. He has telangiectasias bilaterally and hyperpigmentation consistent with chronic venous insufficiency. He has no significant lower extremity swelling. PULMONARY: There is good air exchange bilaterally without wheezing or rales. ABDOMEN: Soft and non-tender with normal pitched bowel sounds.  MUSCULOSKELETAL: There are no major deformities or cyanosis. NEUROLOGIC: No focal weakness or paresthesias are detected. SKIN: There is a superficial ulceration over his lateral malleolus that measures about a centimeter in diameter.  There is nylon a soft tissue protecting this from lateral malleolus. PSYCHIATRIC: The patient has a normal affect.  DATA:    ARTERIAL DOPPLER STUDY: I have independently interpreted the arterial Doppler study that was done on 01/04/2017.  On the left side, which is the site of concern, there is a triphasic posterior tibial signal and a monophasic dorsalis pedis signal.  ABI on the left is 100%.  Toe pressure is 0  On the right side there is a triphasic posterior tibial signal and a biphasic dorsalis pedis signal.  ABI is 100%.  Toe pressure on the right is 101  mmHg.  VENOUS DUPLEX: I reviewed a venous duplex scan that was done in March 2014.  This showed no evidence of DVT of the right lower extremity.  There was evidence of phlebitis of the right small saphenous vein.  MEDICAL ISSUES:   PERIPHERAL VASCULAR DISEASE WITH NONHEALING WOUND LEFT LATERAL MALLEOLUS: The patient has evidence of tibial artery occlusive disease on exam.  He does have a triphasic posterior tibial signal with the Doppler and a palpable posterior tibial pulse, however, this would not be the best isotome for perfusion of the lateral malleolus.  He has evidence of tibial artery occlusive disease with a monophasic dorsalis pedis signal on the left.  Despite his age, I think is a reasonable candidate for arteriography to see if there is any tibial disease that might be amenable to angioplasty that would improve perfusion to the lateral malleolus.  I have reviewed with the patient the indications for arteriography. In addition, I have reviewed the potential complications of arteriography including but not limited to: Bleeding, arterial injury, arterial thrombosis, dye action, renal insufficiency, or other unpredictable medical problems. I have explained to the patient that if we find disease amenable to angioplasty we could potentially address this at the same time. I have discussed the potential complications of angioplasty and stenting, including but not limited to: Bleeding, arterial thrombosis, arterial injury, dissection, or the need for surgical intervention.  This has been scheduled for 01/17/2017.  I have explained that certainly if the wound is improving we can hold off on arteriography.  He does have evidence of chronic venous insufficiency also and there could be a component of venous stasis that is causing this ulcer.  The arteriogram and possible intervention will allow [place] to determine how aggressive we can be with compression and elevation.  I will make further recommendations  pending these results.  We will need to stop his Xarelto 4 days prior to the procedure.   Deitra Mayo [facility] [phone]

## 2017-01-06 NOTE — H&P (View-Only) (Signed)
Patient name: Chad Russell MRN: [id number] DOB: 1926/09/09 Sex: male   REASON FOR CONSULT:    Left ankle ulcer.  The consult is requested by Chad. Carlota Russell.   HPI:   Chad Russell is a pleasant 83 y.o. male, who developed a wound on his left lateral malleolus approximately 3 weeks ago.  He does not remember any specific injury to the ankle.  This has been slow to heal.  I reviewed the office note from the referring office.  The patient was seen on 01/03/2017 for continued follow-up of a left ankle ulcer.  The patient has had some bilateral ankle pain for the last month.  He was started on Bactrim for the wound on the lateral ankle.  Patient is referred for vascular consultation given persistent wound.  Patient activity is limited but he denies any claudication.  He denies any history of rest pain.  He has been very active when he was younger.  He is on Xarelto for atrial fibrillation.  His risk factors for peripheral vascular disease or hypertension.  He denies any history of diabetes, hypercholesterolemia, family history of premature cardiovascular disease or tobacco use.  Past Medical History:  Diagnosis Date  . Arthritis   . Atrial fibrillation ([place])   . Balance problem 10/31/2013  . BPH (benign prostatic hyperplasia) 2014   Urology Chad Russell with [facility] [phone]   . Bradycardia    a. baseline HR in the 30's. Asymptomatic - no history of PPM placement.   Marland Kitchen CAD (coronary artery disease)    LHC 03/16/11: Mid LAD 10-20%, proximal circumflex 10%, mid RCA 20%, EF 55%.  . Cataract   . Complete heart block ([place])   . GERD (gastroesophageal reflux disease)    takes occasional  zantac  . Hx of echocardiogram    Echo 02/2011: EF 65-70%, normal wall motion, MAC, mild MR, mild LAE, mild RVE  . Hypertension   . OSA (obstructive sleep apnea)    per patient went to have sleep study done about 2 years ago, bu they never F/U with him about results; but wife reports he still has periods  of apnea at night   . Talar fracture    casted no surg    Family History  Problem Relation Age of Onset  . Hypertension Mother   . Multiple sclerosis Sister     SOCIAL HISTORY: Social History   Socioeconomic History  . Marital status: Married    Spouse name: Chad Russell  . Number of children: 0  . Years of education: 62  . Highest education level: Not on file  Social Needs  . Financial resource strain: Not on file  . Food insecurity - worry: Not on file  . Food insecurity - inability: Not on file  . Transportation needs - medical: Not on file  . Transportation needs - non-medical: Not on file  Occupational History  . Occupation: Scientist, product/process development  . Occupation: Civil engineer, contracting  Tobacco Use  . Smoking status: Former Smoker    Packs/day: 1.00    Years: 3.00    Pack years: 3.00    Types: Cigarettes    Last attempt to quit: 03/17/1955    Years since quitting: 61.8  . Smokeless tobacco: Former Network engineer and Sexual Activity  . Alcohol use: Yes    Alcohol/week: 8.4 oz    Types: 7 Cans of beer, 7 Standard drinks or equivalent per week    Comment: daily  . Drug use: No  .  Sexual activity: No    Birth control/protection: None  Other Topics Concern  . 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  Has no children.      Allergies  Allergen Reactions  . Doxycycline     Upset stomach  . Keflex [Cephalexin]     Abdominal disruption    Current Outpatient Medications  Medication Sig Dispense Refill  . Calcium Carbonate Antacid (ALKA-SELTZER ANTACID PO) Take by mouth.    . carbidopa-levodopa (SINEMET IR) 25-100 MG tablet Take 1 tab at 8:30am, 1 tablet at 1pm, and 1 tablet at 6pm. 90 tablet 5  . Docusate Sodium (COLACE PO) Take 1 tablet by mouth at bedtime as needed (constipation).     Marland Kitchen  lisinopril-hydrochlorothiazide (PRINZIDE,ZESTORETIC) 10-12.5 MG tablet Take 1 tablet by mouth daily. 90 tablet 3  . LUTEIN-ZEAXANTHIN PO Take 1 tablet by mouth at bedtime.    . Multiple Vitamins-Minerals (PRESERVISION AREDS 2+MULTI VIT PO) Take 1 tablet by mouth 2 (two) times daily.    . Probiotic Product (PROBIOTIC DAILY PO) Take 1 tablet by mouth daily.    . timolol (TIMOPTIC) 0.5 % ophthalmic solution Place 1 drop into both eyes at bedtime.     . Turmeric (CURCUMIN 95) 500 MG CAPS Take 500 mg by mouth daily.    . vitamin C (ASCORBIC ACID) 500 MG tablet Take 500 mg by mouth daily.    Alveda Reasons 20 MG TABS tablet     . finasteride (PROSCAR) 5 MG tablet Take 5 mg by mouth daily.     Marland Kitchen POLYETHYLENE GLYCOL 3350 PO Take 17 g by mouth daily.    Marland Kitchen sulfamethoxazole-trimethoprim (BACTRIM DS,SEPTRA DS) 800-160 MG tablet Take 1 tablet 2 (two) times daily by mouth. (Patient not taking: Reported on 01/06/2017) 14 tablet 0   No current facility-administered medications for this visit.     REVIEW OF SYSTEMS:  _0  denotes positive finding, _1  denotes negative finding Cardiac  Comments:  Chest pain or chest pressure:    Shortness of breath upon exertion:    Short of breath when lying flat:    Irregular heart rhythm:        Vascular    Pain in calf, thigh, or hip brought on by ambulation:    Pain in feet at night that wakes you up from your sleep:  X   Blood clot in your veins:    Leg swelling:         Pulmonary    Oxygen at home:    Productive cough:     Wheezing:         Neurologic    Sudden weakness in arms or legs:     Sudden numbness in arms or legs:     Sudden onset of difficulty speaking or slurred speech:    Temporary loss of vision in one eye:     Problems with dizziness:         Gastrointestinal    Blood in stool:     Vomited blood:         Genitourinary    Burning when urinating:     Blood in urine:        Psychiatric    Major depression:  Hematologic      Bleeding problems:    Problems with blood clotting too easily:        Skin    Rashes or ulcers:        Constitutional    Fever or chills:     PHYSICAL EXAM:   Vitals:   01/06/17 1602  BP: 140/76  Pulse: 60  Resp: 16  Temp: (!) 97.1 F (36.2 C)  TempSrc: Oral  SpO2: 97%  Weight: 166 lb (75.3 kg)  Height: 5' 11" (1.803 m)    GENERAL: The patient is a well-nourished male, in no acute distress. The vital signs are documented above. CARDIAC: There is a regular rate and rhythm.  VASCULAR: I do not detect carotid bruits. On the left side which is the site of concern, he has a palpable femoral and popliteal pulse.  In addition he has a posterior tibial pulse.  I cannot obtain a dorsalis pedis pulse. On the right side he has a palpable femoral and popliteal pulse.  I cannot palpate pedal pulses. He has telangiectasias bilaterally and hyperpigmentation consistent with chronic venous insufficiency. He has no significant lower extremity swelling. PULMONARY: There is good air exchange bilaterally without wheezing or rales. ABDOMEN: Soft and non-tender with normal pitched bowel sounds.  MUSCULOSKELETAL: There are no major deformities or cyanosis. NEUROLOGIC: No focal weakness or paresthesias are detected. SKIN: There is a superficial ulceration over his lateral malleolus that measures about a centimeter in diameter.  There is nylon a soft tissue protecting this from lateral malleolus. PSYCHIATRIC: The patient has a normal affect.  DATA:    ARTERIAL DOPPLER STUDY: I have independently interpreted the arterial Doppler study that was done on 01/04/2017.  On the left side, which is the site of concern, there is a triphasic posterior tibial signal and a monophasic dorsalis pedis signal.  ABI on the left is 100%.  Toe pressure is 0  On the right side there is a triphasic posterior tibial signal and a biphasic dorsalis pedis signal.  ABI is 100%.  Toe pressure on the right is 101  mmHg.  VENOUS DUPLEX: I reviewed a venous duplex scan that was done in March 2014.  This showed no evidence of DVT of the right lower extremity.  There was evidence of phlebitis of the right small saphenous vein.  MEDICAL ISSUES:   PERIPHERAL VASCULAR DISEASE WITH NONHEALING WOUND LEFT LATERAL MALLEOLUS: The patient has evidence of tibial artery occlusive disease on exam.  He does have a triphasic posterior tibial signal with the Doppler and a palpable posterior tibial pulse, however, this would not be the best isotome for perfusion of the lateral malleolus.  He has evidence of tibial artery occlusive disease with a monophasic dorsalis pedis signal on the left.  Despite his age, I think is a reasonable candidate for arteriography to see if there is any tibial disease that might be amenable to angioplasty that would improve perfusion to the lateral malleolus.  I have reviewed with the patient the indications for arteriography. In addition, I have reviewed the potential complications of arteriography including but not limited to: Bleeding, arterial injury, arterial thrombosis, dye action, renal insufficiency, or other unpredictable medical problems. I have explained to the patient that if we find disease amenable to angioplasty we could potentially address this at the same time. I have discussed the potential complications of angioplasty and stenting, including but not limited to: Bleeding, arterial thrombosis, arterial injury, dissection, or the need for surgical intervention.    This has been scheduled for 01/17/2017.  I have explained that certainly if the wound is improving we can hold off on arteriography.  He does have evidence of chronic venous insufficiency also and there could be a component of venous stasis that is causing this ulcer.  The arteriogram and possible intervention will allow [place] to determine how aggressive we can be with compression and elevation.  I will make further recommendations  pending these results.  We will need to stop his Xarelto 4 days prior to the procedure.   Deitra Mayo [facility] [phone]

## 2017-01-07 ENCOUNTER — Other Ambulatory Visit: Payer: Self-pay | Admitting: *Deleted

## 2017-01-07 NOTE — Progress Notes (Signed)
Call to patient and wife. Instructed to hold Xarelto 4 days pre-procedure. NPO past MN for Aortogram on 11/26-18 to be at hospital at 7am.Morning of may take any heart or blood pressure medications with sips of water but hold all other till after the test.  Must have driver to go home.

## 2017-01-11 ENCOUNTER — Encounter: Payer: Self-pay | Admitting: Hematology and Oncology

## 2017-01-11 ENCOUNTER — Ambulatory Visit (HOSPITAL_BASED_OUTPATIENT_CLINIC_OR_DEPARTMENT_OTHER): Payer: PPO | Admitting: Hematology and Oncology

## 2017-01-11 ENCOUNTER — Other Ambulatory Visit: Payer: Self-pay

## 2017-01-11 ENCOUNTER — Encounter: Payer: Self-pay | Admitting: Family Medicine

## 2017-01-11 ENCOUNTER — Telehealth: Payer: Self-pay | Admitting: Hematology and Oncology

## 2017-01-11 ENCOUNTER — Ambulatory Visit: Payer: PPO | Admitting: Family Medicine

## 2017-01-11 VITALS — BP 145/51 | HR 38 | Temp 97.6°F | Resp 18 | Ht 71.0 in | Wt 168.8 lb

## 2017-01-11 VITALS — BP 126/88 | HR 29 | Temp 97.3°F | Resp 18 | Ht 71.0 in | Wt 168.0 lb

## 2017-01-11 DIAGNOSIS — D472 Monoclonal gammopathy: Secondary | ICD-10-CM

## 2017-01-11 DIAGNOSIS — I739 Peripheral vascular disease, unspecified: Secondary | ICD-10-CM | POA: Diagnosis not present

## 2017-01-11 DIAGNOSIS — G620 Drug-induced polyneuropathy: Secondary | ICD-10-CM | POA: Diagnosis not present

## 2017-01-11 DIAGNOSIS — L97321 Non-pressure chronic ulcer of left ankle limited to breakdown of skin: Secondary | ICD-10-CM

## 2017-01-11 DIAGNOSIS — I4891 Unspecified atrial fibrillation: Secondary | ICD-10-CM | POA: Diagnosis not present

## 2017-01-11 NOTE — Telephone Encounter (Signed)
Gave avs and calendar for December  °

## 2017-01-11 NOTE — Progress Notes (Signed)
Subjective:  By signing my name below, I, Chad Russell, attest that this documentation has been prepared under the direction and in the presence of [name], MD Electronically Signed: Ladene Artist, ED Scribe 01/11/2017 at 11:02 AM.   Patient ID: Chad Russell, male    DOB: June 15, 1926, 83 y.o.   MRN: [id number]  Chief Complaint  Patient presents with  . Follow-up    follow up on L ankle wound, per wife would will not heal   HPI Chad Russell is a 83 y.o. male who presents to Primary Care at [facility] for follow-up on L ankle wound. H/o multiple medical problems including a complete heart block that has been asymptomatic, followed by cardiology. Have been seeing him most recently for a wound to the L ankle. Treated with bactrim on 11/8 for possible cellulitis around abrasion versus ulcer. Referred to vascular surgery to evaluate for possible PVD. Appeared to be stable/improving with reassuring wound culture at f/u on 11/12. Seen by vascular surgery Dr. Scot Dock 5 days ago. In review of that note, he had evidence of tibial artery occlusive disease with monophasic dorsalis pedis signal on L with doppler. Plan for arteriography to eval for angioplasty. Also possible venous insufficiency/stasis. Depending on arteriogram, plans to determine how aggressive approach can be with elevation and compression. Study is scheduled for 6 days. Completed septra 5 days ago.   Pt's wife states that they were told by Dr. Scot Dock that if the wound heals in 3 days, then he is to call his office and does not have to have the procedure. Pt states that the wound seemed to be getting better but became sore again last night. He does report some stiffness in the L ankle compared to the R. Pt was initially washing the area with baking soda but recently switched to hot, salt water. He has also applied polysporin to the area. Denies more redness, discharge/pus.  Follows up with his cardiologist who does not feel  that he needs a pacemaker at this time for complete heart block. Denies light-headedness, dizziness.  Patient Active Problem List   Diagnosis Date Noted  . Chest congestion 11/30/2016  . Cough 11/30/2016  . Acute bronchitis 11/30/2016  . BPH with obstruction/lower urinary tract symptoms 09/07/2016  . Preoperative cardiovascular examination 08/24/2016  . Dysuria 05/13/2016  . Urinary retention 05/13/2016  . Nausea without vomiting 05/13/2016  . Pelvic pain 05/13/2016  . Balance problem 10/31/2013  . OSA (obstructive sleep apnea) 10/31/2013  . BPH (benign prostatic hyperplasia) 08/28/2012  . Atrial fibrillation ([place]) 06/08/2012  . Bacteremia 04/28/2012  . Hyperplasia of prostate with lower urinary tract symptoms (LUTS) 04/28/2012  . Calf pain 04/28/2012  . Moderate malnutrition ([place]) 04/28/2012  . Influenza-like illness 04/27/2012  . Infection of urinary tract - recurrent 04/26/2012  . NSTEMI (non-ST elevated myocardial infarction) ([place]) 03/15/2011  . Complete heart block ([place]) 03/15/2011  . HTN (hypertension) 03/15/2011   Past Medical History:  Diagnosis Date  . Arthritis   . Atrial fibrillation ([place])   . Balance problem 10/31/2013  . BPH (benign prostatic hyperplasia) 2014   Urology Dr [name] with [facility] [phone]   . Bradycardia    a. baseline HR in the 30's. Asymptomatic - no history of PPM placement.   Marland Kitchen CAD (coronary artery disease)    LHC 03/16/11: Mid LAD 10-20%, proximal circumflex 10%, mid RCA 20%, EF 55%.  . Cataract   . Complete heart block ([place])   . GERD (gastroesophageal reflux  disease)    takes occasional  zantac  . Hx of echocardiogram    Echo 02/2011: EF 65-70%, normal wall motion, MAC, mild MR, mild LAE, mild RVE  . Hypertension   . OSA (obstructive sleep apnea)    per patient went to have sleep study done about 2 years ago, bu they never F/U with him about results; but wife reports he still has periods of apnea at night   . Talar fracture    casted no surg    Past Surgical History:  Procedure Laterality Date  . COLONOSCOPY     Repeated and normal in 2011  . hemmorhoidectomy    . INSERTION OF MESH Right 02/26/2014   Performed by Ralene Ok, MD at [place]  . LAPAROSCOPIC RIGHT INGUINAL HERNIA REPAIR Right 02/26/2014   Performed by Ralene Ok, MD at [place]  . LEFT HEART CATHETERIZATION WITH CORONARY ANGIOGRAM N/A 03/16/2011   Performed by Martinique, Peter M, MD at [facility] CATH LAB  . PROSTATE BIOPSY     negative - [facility]  . TRANSURETHRAL RESECTION OF THE PROSTATE (TURP) N/A 09/07/2016   Performed by Irine Seal, MD at [facility] ORS   Allergies  Allergen Reactions  . Doxycycline     Upset stomach  . Keflex [Cephalexin]     Abdominal disruption   Prior to Admission medications   Medication Sig Start Date End Date Taking? Authorizing Provider  carbidopa-levodopa (SINEMET IR) 25-100 MG tablet Take 1 tab at 8:30am, 1 tablet at 1pm, and 1 tablet at 6pm. Patient not taking: Reported on 01/10/2017 11/18/16   Narda Amber K, DO  docusate sodium (COLACE) 100 MG capsule Take 100 mg at bedtime as needed by mouth for mild constipation.    [provider]  hydrocortisone cream 1 % Apply 1 application daily as needed topically for itching.    [provider]  lisinopril-hydrochlorothiazide (PRINZIDE,ZESTORETIC) 10-12.5 MG tablet Take 1 tablet by mouth daily. 12/20/16   Minus Breeding, MD  LUTEIN-ZEAXANTHIN PO Take 1 tablet daily by mouth.     [provider]  MAGNESIUM CITRATE PO Take 250 mg daily by mouth.    [provider]  Misc Natural Products (PROSTATE) CAPS Take 1 tablet daily by mouth. Prostate Plus otc supplement    [provider]  Multiple Vitamin (MULTIVITAMIN WITH MINERALS) TABS tablet Take 1 tablet daily by mouth.    [provider]  Multiple Vitamins-Minerals (PRESERVISION AREDS 2+MULTI VIT PO) Take 1 tablet by mouth 2 (two) times daily.    [provider]  OVER THE COUNTER  MEDICATION Take 1 tablet 2 (two) times daily by mouth. Super beta prostate otc supplement    [provider]  Probiotic Product (PROBIOTIC DAILY PO) Take 1 tablet by mouth daily.    [provider]  ranitidine (ZANTAC) 150 MG tablet Take 150 mg daily as needed by mouth for heartburn.    [provider]  sulfamethoxazole-trimethoprim (BACTRIM DS,SEPTRA DS) 800-160 MG tablet Take 1 tablet 2 (two) times daily by mouth. Patient not taking: Reported on 01/06/2017 12/30/16   [name], MD  timolol (TIMOPTIC) 0.5 % ophthalmic solution Place 1 drop into both eyes at bedtime.  12/04/15   [provider]  vitamin C (ASCORBIC ACID) 500 MG tablet Take 500 mg by mouth daily.    [provider]  XARELTO 20 MG TABS tablet Take 20 mg daily with supper by mouth.  11/12/16   [provider]   Social  History   Socioeconomic History  . Marital status: Married    Spouse name: Mardene Celeste  . Number of children: 0  . Years of education: 16  . Highest education level: Not on file  Social Needs  . Financial resource strain: Not on file  . Food insecurity - worry: Not on file  . Food insecurity - inability: Not on file  . Transportation needs - medical: Not on file  . Transportation needs - non-medical: Not on file  Occupational History  . Occupation: Scientist, product/process development  . Occupation: Civil engineer, contracting  Tobacco Use  . Smoking status: Former Smoker    Packs/day: 1.00    Years: 3.00    Pack years: 3.00    Types: Cigarettes    Last attempt to quit: 03/17/1955    Years since quitting: 61.8  . Smokeless tobacco: Former Network engineer and Sexual Activity  . Alcohol use: Yes    Alcohol/week: 8.4 oz    Types: 7 Cans of beer, 7 Standard drinks or equivalent per week    Comment: daily  . Drug use: No  . Sexual activity: No    Birth control/protection: None  Other Topics Concern  . 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  Has no children.     Review of Systems  Skin: Positive for wound. Negative for color change.  Neurological: Negative for dizziness and light-headedness.      Objective:   Physical Exam  Constitutional: He is oriented to person, place, and time. He appears well-developed and well-nourished. No distress.  HENT:  Head: Normocephalic and atraumatic.  Eyes: Conjunctivae and EOM are normal.  Neck: Neck supple. No tracheal deviation present.  Cardiovascular: Regular rhythm.  Pulmonary/Chest: Effort normal. No respiratory distress.  Musculoskeletal: Normal range of motion.  L ankle: no bony tenderness  Neurological: He is alert and oriented to person, place, and time.  Skin: Skin is warm and dry.  8 mm x 6 mm shallow ulcer over the bony prominence over the lateral malleolus without appreciable induration. No discharge or exudate. Small healing eschar inferiorly. No appreciable warmth.  Psychiatric: He has a normal mood and affect. His behavior is normal.  Nursing note and vitals reviewed.  Vitals:   01/11/17 1024  BP: 126/88  Pulse: (!) 29  Resp: 18  Temp: (!) 97.3 F (36.3 C)  TempSrc: Oral  SpO2: 100%  Weight: 168 lb (76.2 kg)  Height: _0  (1.803 m)      Assessment & Plan:  HELEN CUFF is a 83 y.o. male Skin ulcer of left ankle, limited to breakdown of skin (HCC)  PAD (peripheral artery disease) ([place])  Slowly healing ulcer left lateral heel. May have been initial abrasion that proceeded to ulcer with possible early secondary cellulitis but wound culture was reassuring. No sign of infection at present. Based on measurements from 8 days ago to today, there has been slight contraction of wound. Suspect PAD as contributor for delay in healing.  - Continue wound care with soap and water cleansing, Polysporin, bandage.  Recommend against salt or salt water treatment at this time.  - Continue follow-up with vascular surgeon and to determine if testing still needed next week.   - If not continuing to improve in  1 week, would consider referral to wound care. RTC precautions.  No orders of the defined types were placed in this encounter.  Patient Instructions    Continue soap and water cleansing twice per day, polysporin antibiotic and keep clean/covered. If it is not showing signs of healing in next week, I can refer you to Wound care and hyperbaric center. Keep follow up as planned  with vascular specialist as arterial disease may be impacting time of healing. It is slightly better in size today (13 mm on the 12th, 8 mm today)  Follow up sooner if worse.   Return to the clinic or go to the nearest emergency room if any of your symptoms worsen or new symptoms occur.   IF you received an x-ray today, you will receive an invoice from [facility] Radiology. Please contact [facility] Radiology at [phone] with questions or concerns regarding your invoice.   IF you received labwork today, you will receive an invoice from [facility]. Please contact [facility] at [phone] with questions or concerns regarding your invoice.   Our billing staff will not be able to assist you with questions regarding bills from these companies.  You will be contacted with the lab results as soon as they are available. The fastest way to get your results is to activate your My Chart account. Instructions are located on the last page of this paperwork. If you have not heard from [place] regarding the results in 2 weeks, please contact this office.      I personally performed the services described in this documentation, which was scribed in my presence. The recorded information has been reviewed and considered for accuracy and completeness, addended by me as needed, and agree with information above.  Signed,   [name], MD Primary  Care at [facility].  01/11/17 1:37 PM

## 2017-01-11 NOTE — Patient Instructions (Addendum)
  Continue soap and water cleansing twice per day, polysporin antibiotic and keep clean/covered. If it is not showing signs of healing in next week, I can refer you to Wound care and hyperbaric center. Keep follow up as planned  with vascular specialist as arterial disease may be impacting time of healing. It is slightly better in size today (13 mm on the 12th, 8 mm today)  Follow up sooner if worse.   Return to the clinic or go to the nearest emergency room if any of your symptoms worsen or new symptoms occur.   IF you received an x-ray today, you will receive an invoice from [facility] Radiology. Please contact [facility] Radiology at [phone] with questions or concerns regarding your invoice.   IF you received labwork today, you will receive an invoice from [facility]. Please contact [facility] at [phone] with questions or concerns regarding your invoice.   Our billing staff will not be able to assist you with questions regarding bills from these companies.  You will be contacted with the lab results as soon as they are available. The fastest way to get your results is to activate your My Chart account. Instructions are located on the last page of this paperwork. If you have not heard from [place] regarding the results in 2 weeks, please contact this office.

## 2017-01-12 LAB — UPEP/UIFE/LIGHT CHAINS/TP, 24-HR UR
% BETA, Urine: 17.6 %
ALBUMIN, U: 50.2 %
ALPHA 1 URINE: 3.6 %
ALPHA-2-GLOBULIN, U: 9.7 %
Free Kappa Lt Chains,Ur: 150 mg/L — ABNORMAL HIGH (ref 1.35–24.19)
Free Lambda Lt Chains,Ur: 5.25 mg/L (ref 0.24–6.66)
GAMMA GLOBULIN URINE: 18.9 %
Kappa/Lambda Ratio,U: 28.57 — ABNORMAL HIGH (ref 2.04–10.37)
M-SPIKE, %: 3.2 % — AB
M-SPIKE, MG/24 HR: 10 mg/(24.h) — AB
PROTEIN,TOTAL,URINE: 33 mg/dL
Prot,24hr calculated: 314 mg/24 hr — ABNORMAL HIGH (ref 30–150)

## 2017-01-15 DIAGNOSIS — D472 Monoclonal gammopathy: Secondary | ICD-10-CM | POA: Insufficient documentation

## 2017-01-15 NOTE — Assessment & Plan Note (Signed)
83 y.o. male with a number of comorbidities including peripheral neuropathy, peripheral vascular disease, complete heart block and atrial fibrillation referred to our clinic for discovery of possible monoclonal protein spike on protein electrophoresis and March 2018.  At the present time, patient has no other symptoms pointing in the direction of multiple myeloma other than his peripheral neuropathy which may be more easily attributable to his peripheral vascular disease.  Nevertheless, presence of heart block raises possibility of amyloidosis and needs to be additionally evaluated.  Plan: --Proceed with lab work assessment as outlined below. --Return to clinic in 1 week to review the findings and discuss further need for evaluation.

## 2017-01-15 NOTE — Progress Notes (Signed)
[facility] New Visit:  Assessment: MGUS (monoclonal gammopathy of unknown significance) 83 y.o. male with a number of comorbidities including peripheral neuropathy, peripheral vascular disease, complete heart block and atrial fibrillation referred to our clinic for discovery of possible monoclonal protein spike on protein electrophoresis and March 2018.  At the present time, patient has no other symptoms pointing in the direction of multiple myeloma other than his peripheral neuropathy which may be more easily attributable to his peripheral vascular disease.  Nevertheless, presence of heart block raises possibility of amyloidosis and needs to be additionally evaluated.  Plan: --Proceed with lab work assessment as outlined below. --Return to clinic in 1 week to review the findings and discuss further need for evaluation. Voice recognition software was used and creation of this note. Despite my best effort at editing the text, some misspelling/errors may have occurred.  Orders Placed This Encounter  Procedures  . CBC & Diff and Retic    Standing Status:   Future    Number of Occurrences:   1    Standing Expiration Date:   01/04/2018  . Comprehensive metabolic panel    Standing Status:   Future    Number of Occurrences:   1    Standing Expiration Date:   01/04/2018  . Lactate dehydrogenase (LDH)    Standing Status:   Future    Number of Occurrences:   1    Standing Expiration Date:   01/04/2018  . Beta 2 microglobulin    Standing Status:   Future    Number of Occurrences:   1    Standing Expiration Date:   01/04/2018  . Multiple Myeloma Panel (SPEP&IFE w/QIG)    Standing Status:   Future    Number of Occurrences:   1    Standing Expiration Date:   01/04/2018  . 24-Hr Ur UPEP/UIFE/Light Chains/TP    Standing Status:   Future    Number of Occurrences:   1    Standing Expiration Date:   01/04/2018  . Kappa/lambda light chains    Standing Status:   Future   Number of Occurrences:   1    Standing Expiration Date:   01/04/2018  . ANA, IFA (with reflex)    Standing Status:   Future    Number of Occurrences:   1    Standing Expiration Date:   01/04/2018  . Rheumatoid factor    Standing Status:   Future    Number of Occurrences:   1    Standing Expiration Date:   01/04/2018    All questions were answered.  . The patient knows to call the clinic with any problems, questions or concerns.  This note was electronically signed.    History of Presenting Illness Chad Russell 83 y.o. presenting to the [facility] for medical gammopathy of unknown significance in the referral from Dr Alda Berthold.  Patient was being followed by neurology for bilateral lower extremity neuropathy with abnormal gait and loss of balance.  The patient reports the symptoms of progressive neuropathy since 2010.  He also has peripheral vascular disease, possible venous stasis with cutaneous ulcers on both ankles.  Additional complaints include progressive muscle mass loss due to limited mobility due to balance problems.  Patient denies fevers, chills or night sweats.  No significant change in appetite although activity level is diminished as above.  Patient denies chest pain, shortness of breath, or cough.  Denies nausea, vomiting, abdominal pain, diarrhea, or  constipation.  No urinary or dermatological complaints.  Review of past medical history significant for obstructive sleep apnea, atrial fibrillation, non-ST elevation myocardial infarction, grade 3 heart block with bradycardia, hypertension, and GERD.  Oncological/hematological History: --Labs, 05/14/16: SPEP --possible, but not definitive monoclonal spike present; IgG 1053, IgA 184, IgM 41 --Labs, 11/09/16: tProt 6.5, Alb 4.1, Ca 9.4, Cr 1.1, AP 54; WBC 5.6, Hgb 11.2, Plt 264  Medical History: Past Medical History:  Diagnosis Date  . Arthritis   . Atrial fibrillation ([place])   . Balance problem 10/31/2013  . BPH  (benign prostatic hyperplasia) 2014   Urology Dr [name] with [facility] [phone]   . Bradycardia    a. baseline HR in the 30's. Asymptomatic - no history of PPM placement.   Marland Kitchen CAD (coronary artery disease)    LHC 03/16/11: Mid LAD 10-20%, proximal circumflex 10%, mid RCA 20%, EF 55%.  . Cataract   . Complete heart block ([place])   . GERD (gastroesophageal reflux disease)    takes occasional  zantac  . Hx of echocardiogram    Echo 02/2011: EF 65-70%, normal wall motion, MAC, mild MR, mild LAE, mild RVE  . Hypertension   . OSA (obstructive sleep apnea)    per patient went to have sleep study done about 2 years ago, bu they never F/U with him about results; but wife reports he still has periods of apnea at night   . Talar fracture    casted no surg    Surgical History: Past Surgical History:  Procedure Laterality Date  . COLONOSCOPY     Repeated and normal in 2011  . hemmorhoidectomy    . INGUINAL HERNIA REPAIR Right 02/26/2014   Procedure: LAPAROSCOPIC RIGHT INGUINAL HERNIA REPAIR;  Surgeon: Ralene Ok, MD;  Location: [facility];  Service: General;  Laterality: Right;  . INSERTION OF MESH Right 02/26/2014   Procedure: INSERTION OF MESH;  Surgeon: Ralene Ok, MD;  Location: [facility];  Service: General;  Laterality: Right;  . LEFT HEART CATHETERIZATION WITH CORONARY ANGIOGRAM N/A 03/16/2011   Procedure: LEFT HEART CATHETERIZATION WITH CORONARY ANGIOGRAM;  Surgeon: Peter M Martinique, MD;  Location: [facility] CATH LAB;  Service: Cardiovascular;  Laterality: N/A;  . PROSTATE BIOPSY     negative - [facility]  . TRANSURETHRAL RESECTION OF PROSTATE N/A 09/07/2016   Procedure: TRANSURETHRAL RESECTION OF THE PROSTATE (TURP);  Surgeon: Irine Seal, MD;  Location: [facility];  Service: Urology;  Laterality: N/A;    Family History: Family History  Problem Relation Age of Onset  . Hypertension Mother   . Multiple sclerosis Sister     Social History: Social History   Socioeconomic History  . Marital  status: Married    Spouse name: Mardene Celeste  . Number of children: 0  . Years of education: 62  . Highest education level: Not on file  Social Needs  . Financial resource strain: Not on file  . Food insecurity - worry: Not on file  . Food insecurity - inability: Not on file  . Transportation needs - medical: Not on file  . Transportation needs - non-medical: Not on file  Occupational History  . Occupation: Scientist, product/process development  . Occupation: Civil engineer, contracting  Tobacco Use  . Smoking status: Former Smoker    Packs/day: 1.00    Years: 3.00    Pack years: 3.00    Types: Cigarettes    Last attempt to quit: 03/17/1955    Years since quitting: 61.8  . Smokeless tobacco: Former Systems developer  Substance and Sexual Activity  . Alcohol use: Yes    Alcohol/week: 8.4 oz    Types: 7 Cans of beer, 7 Standard drinks or equivalent per week    Comment: daily  . Drug use: No  . Sexual activity: No    Birth control/protection: None  Other Topics Concern  . 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  Has no children.      Allergies: Allergies  Allergen Reactions  . Doxycycline     Upset stomach  . Keflex [Cephalexin]     Abdominal disruption    Medications:  Current Outpatient Medications  Medication Sig Dispense Refill  . carbidopa-levodopa (SINEMET IR) 25-100 MG tablet Take 1 tab at 8:30am, 1 tablet at 1pm, and 1 tablet at 6pm. (Patient not taking: Reported on 01/10/2017) 90 tablet 5  . lisinopril-hydrochlorothiazide (PRINZIDE,ZESTORETIC) 10-12.5 MG tablet Take 1 tablet by mouth daily. 90 tablet 3  . LUTEIN-ZEAXANTHIN PO Take 1 tablet daily by mouth.     . Multiple Vitamins-Minerals (PRESERVISION AREDS 2+MULTI VIT PO) Take 1 tablet by mouth 2 (two) times daily.    . Probiotic Product (PROBIOTIC DAILY PO) Take 1 tablet  by mouth daily.    Marland Kitchen sulfamethoxazole-trimethoprim (BACTRIM DS,SEPTRA DS) 800-160 MG tablet Take 1 tablet 2 (two) times daily by mouth. (Patient not taking: Reported on 01/06/2017) 14 tablet 0  . timolol (TIMOPTIC) 0.5 % ophthalmic solution Place 1 drop into both eyes at bedtime.     . vitamin C (ASCORBIC ACID) 500 MG tablet Take 500 mg by mouth daily.    Alveda Reasons 20 MG TABS tablet Take 20 mg daily with supper by mouth.     . docusate sodium (COLACE) 100 MG capsule Take 100 mg at bedtime as needed by mouth for mild constipation.    . hydrocortisone cream 1 % Apply 1 application daily as needed topically for itching.    Marland Kitchen MAGNESIUM CITRATE PO Take 250 mg daily by mouth.    . Misc Natural Products (PROSTATE) CAPS Take 1 tablet daily by mouth. Prostate Plus otc supplement    . Multiple Vitamin (MULTIVITAMIN WITH MINERALS) TABS tablet Take 1 tablet daily by mouth.    Marland Kitchen OVER THE COUNTER MEDICATION Take 1 tablet 2 (two) times daily by mouth. Super beta prostate otc supplement    . ranitidine (ZANTAC) 150 MG tablet Take 150 mg daily as needed by mouth for heartburn.     No current facility-administered medications for this visit.     Review of Systems: Review of Systems  All other systems reviewed and are negative.    PHYSICAL EXAMINATION Blood pressure (!) 145/60, pulse (!) 40, resp. rate 18, height _0  (1.803 m), weight 170 lb (77.1 kg), SpO2 100 %.  ECOG PERFORMANCE STATUS: 2 - Symptomatic, <50% confined to bed  Physical Exam  Constitutional: He is oriented to person, place, and time. No distress.  HENT:  Head: Normocephalic and atraumatic.  Mouth/Throat: Oropharynx is clear and moist. No oropharyngeal exudate.  Eyes: Conjunctivae and EOM are normal. Pupils are equal, round, and reactive to light. No scleral icterus.  Neck: No thyromegaly present.  Cardiovascular: Normal rate, regular rhythm and normal heart sounds.  No murmur heard. Pulmonary/Chest: Effort normal and breath  sounds normal.  No respiratory distress. He has no wheezes. He has no rales.  Abdominal: Soft. Bowel sounds are normal. He exhibits no distension and no mass. There is no tenderness. There is no rebound.  Musculoskeletal: He exhibits edema.  Bilateral lower extremity edema.  Dressing in place on both lower extremities limiting further evaluation.  Lymphadenopathy:    He has no cervical adenopathy.  Neurological: He is alert and oriented to person, place, and time. He displays abnormal reflex. No cranial nerve deficit.  Symmetric decrease and deep tendon reflexes in both lower extremities.  Skin: No rash noted. He is not diaphoretic. No erythema.     LABORATORY DATA: I have personally reviewed the data as listed: Appointment on 01/04/2017  Component Date Value Ref Range Status  . WBC 01/04/2017 5.8  4.0 - 10.3 10e3/uL Final  . NEUT# 01/04/2017 3.6  1.5 - 6.5 10e3/uL Final  . HGB 01/04/2017 12.3* 13.0 - 17.1 g/dL Final  . HCT 01/04/2017 37.8* 38.4 - 49.9 % Final  . Platelets 01/04/2017 318  140 - 400 10e3/uL Final  . MCV 01/04/2017 95.5  79.3 - 98.0 fL Final  . MCH 01/04/2017 31.1  27.2 - 33.4 pg Final  . MCHC 01/04/2017 32.5  32.0 - 36.0 g/dL Final  . RBC 01/04/2017 3.96* 4.20 - 5.82 10e6/uL Final  . RDW 01/04/2017 14.4  11.0 - 14.6 % Final  . lymph# 01/04/2017 1.6  0.9 - 3.3 10e3/uL Final  . MONO# 01/04/2017 0.5  0.1 - 0.9 10e3/uL Final  . Eosinophils Absolute 01/04/2017 0.1  0.0 - 0.5 10e3/uL Final  . Basophils Absolute 01/04/2017 0.0  0.0 - 0.1 10e3/uL Final  . NEUT% 01/04/2017 62.2  39.0 - 75.0 % Final  . LYMPH% 01/04/2017 27.2  14.0 - 49.0 % Final  . MONO% 01/04/2017 9.4  0.0 - 14.0 % Final  . EOS% 01/04/2017 0.9  0.0 - 7.0 % Final  . BASO% 01/04/2017 0.3  0.0 - 2.0 % Final  . nRBC 01/04/2017 0  0 - 0 % Final  . Retic % 01/04/2017 1.03  0.80 - 1.80 % Final  . Retic Ct Abs 01/04/2017 40.79  34.80 - 93.90 10e3/uL Final  . Immature Retic Fract 01/04/2017 4.30  3.00 - 10.60 %  Final  . Sodium 01/04/2017 139  136 - 145 mEq/L Final  . Potassium 01/04/2017 4.3  3.5 - 5.1 mEq/L Final  . Chloride 01/04/2017 105  98 - 109 mEq/L Final  . CO2 01/04/2017 26  22 - 29 mEq/L Final  . Glucose 01/04/2017 99  70 - 140 mg/dl Final   Glucose reference range is for nonfasting patients. Fasting glucose reference range is 70- 100.  Marland Kitchen BUN 01/04/2017 20.7  7.0 - 26.0 mg/dL Final  . Creatinine 01/04/2017 1.2  0.7 - 1.3 mg/dL Final  . Total Bilirubin 01/04/2017 0.46  0.20 - 1.20 mg/dL Final  . Alkaline Phosphatase 01/04/2017 58  40 - 150 U/L Final  . AST 01/04/2017 25  5 - 34 U/L Final  . ALT 01/04/2017 18  0 - 55 U/L Final  . Total Protein 01/04/2017 7.4  6.4 - 8.3 g/dL Final  . Albumin 01/04/2017 4.0  3.5 - 5.0 g/dL Final  . Calcium 01/04/2017 9.8  8.4 - 10.4 mg/dL Final  . Anion Gap 01/04/2017 8  3 - 11 mEq/L Final  . EGFR 01/04/2017 56* >60 ml/min/1.73 m2 Final   eGFR is calculated using the CKD-EPI Creatinine Equation (2009)  . LDH 01/04/2017 199  125 -  245 U/L Final  . Beta-2 01/04/2017 1.9  0.6 - 2.4 mg/L Final   Siemens Immulite 2000 Immunochemiluminometric assay (ICMA)  . IgG, Qn, Serum 01/04/2017 1,144  700 - 1600 mg/dL Final  . IgA, Qn, Serum 01/04/2017 231  61 - 437 mg/dL Final  . IgM, Qn, Serum 01/04/2017 49  15 - 143 mg/dL Final  . Total Protein 01/04/2017 6.8  6.0 - 8.5 g/dL Final  . Albumin SerPl Elph-Mcnc 01/04/2017 4.0  2.9 - 4.4 g/dL Final  . Alpha 1 01/04/2017 0.3  0.0 - 0.4 g/dL Final  . Alpha2 Glob SerPl Elph-Mcnc 01/04/2017 0.6  0.4 - 1.0 g/dL Final  . B-Globulin SerPl Elph-Mcnc 01/04/2017 0.8  0.7 - 1.3 g/dL Final  . Gamma Glob SerPl Elph-Mcnc 01/04/2017 1.1  0.4 - 1.8 g/dL Final  . M Protein SerPl Elph-Mcnc 01/04/2017 0.3* Not Observed g/dL Final  . Globulin, Total 01/04/2017 2.8  2.2 - 3.9 g/dL Final  . Albumin/Glob SerPl 01/04/2017 1.5  0.7 - 1.7 Final  . IFE 1 01/04/2017 Comment   Final   Comment: Immunofixation shows IgG monoclonal protein with  lambda light chain specificity.   . Please Note 01/04/2017 Comment   Final   Comment: Protein electrophoresis scan will follow via computer, mail, or courier delivery.   Marland Kitchen PROTEIN,TOTAL,URINE 01/11/2017 33.0  Not Estab. mg/dL Final   Total Volume: 0950 mL  . Prot,24hr calculated 01/11/2017 314* 30 - 150 mg/24 hr Final  . ALBUMIN, U 01/11/2017 50.2  % Final  . ALPHA 1 URINE 01/11/2017 3.6  % Final  . ALPHA-2-GLOBULIN, U 01/11/2017 9.7  % Final  . % BETA, Urine 01/11/2017 17.6  % Final  . GAMMA GLOBULIN URINE 01/11/2017 18.9  % Final  . M-SPIKE, % 01/11/2017 3.2* Not Observed % Final  . M-Spike, mg/24 hr 01/11/2017 10* Not Observed mg/24 hr Final  . Immunofixation Result, Urine 01/11/2017 Comment   Final   Comment: Immunofixation shows IgG monoclonal protein with lambda light chain specificity.   Marland Kitchen NOTE: 01/11/2017 Comment   Final   Comment: Protein electrophoresis scan will follow via computer, mail, or courier delivery.   . Free Kappa Lt Chains,Ur 01/11/2017 150.00* 1.35 - 24.19 mg/L Final   **Results verified by repeat testing**  . Free Lambda Lt Chains,Ur 01/11/2017 5.25  0.24 - 6.66 mg/L Final  . Kappa/Lambda Ratio,U 01/11/2017 28.57* 2.04 - 10.37 Final  . Ig Kappa Free Light Chain 01/04/2017 22.9* 3.3 - 19.4 mg/L Final  . Ig Lambda Free Light Chain 01/04/2017 17.4  5.7 - 26.3 mg/L Final  . Kappa/Lambda FluidC Ratio 01/04/2017 1.32  0.26 - 1.65 Final  . ANA Titer 1 01/04/2017 Negative   Final   Comment:                                                     Negative   <1:80                                                     Borderline  1:80  Positive   >1:80   . RA Latex Turbid. 01/04/2017 <10.0  0.0 - 13.9 IU/mL Final  Office Visit on 12/30/2016  Component Date Value Ref Range Status  . Gram Stain Result 12/30/2016 Final report   Final  . Organism ID, Bacteria 12/30/2016 Comment   Final   No white blood cells seen.  .  Organism ID, Bacteria 12/30/2016 No organisms seen   Final  . Aerobic Bacterial Culture 12/30/2016 Final report   Final  . Organism ID, Bacteria 12/30/2016 Mixed skin flora   Final   Light growth         Ardath Sax, MD

## 2017-01-17 ENCOUNTER — Encounter (HOSPITAL_COMMUNITY)
Admission: RE | Disposition: A | Discharge status: Home / Self Care | Payer: Self-pay | Source: Ambulatory Visit | Attending: Vascular Surgery

## 2017-01-17 ENCOUNTER — Telehealth: Payer: Self-pay | Admitting: Vascular Surgery

## 2017-01-17 ENCOUNTER — Ambulatory Visit (HOSPITAL_COMMUNITY)
Admission: RE | Admit: 2017-01-17 | Discharge: 2017-01-17 | Disposition: A | Discharge status: Home / Self Care | Payer: PPO | Source: Ambulatory Visit | Attending: Vascular Surgery | Admitting: Vascular Surgery

## 2017-01-17 DIAGNOSIS — I442 Atrioventricular block, complete: Secondary | ICD-10-CM | POA: Diagnosis not present

## 2017-01-17 DIAGNOSIS — L97329 Non-pressure chronic ulcer of left ankle with unspecified severity: Secondary | ICD-10-CM | POA: Insufficient documentation

## 2017-01-17 DIAGNOSIS — M1990 Unspecified osteoarthritis, unspecified site: Secondary | ICD-10-CM | POA: Diagnosis not present

## 2017-01-17 DIAGNOSIS — I2510 Atherosclerotic heart disease of native coronary artery without angina pectoris: Secondary | ICD-10-CM | POA: Diagnosis not present

## 2017-01-17 DIAGNOSIS — N400 Enlarged prostate without lower urinary tract symptoms: Secondary | ICD-10-CM | POA: Diagnosis not present

## 2017-01-17 DIAGNOSIS — Z87891 Personal history of nicotine dependence: Secondary | ICD-10-CM | POA: Diagnosis not present

## 2017-01-17 DIAGNOSIS — Z7901 Long term (current) use of anticoagulants: Secondary | ICD-10-CM | POA: Diagnosis not present

## 2017-01-17 DIAGNOSIS — G4733 Obstructive sleep apnea (adult) (pediatric): Secondary | ICD-10-CM | POA: Diagnosis not present

## 2017-01-17 DIAGNOSIS — I10 Essential (primary) hypertension: Secondary | ICD-10-CM | POA: Insufficient documentation

## 2017-01-17 DIAGNOSIS — K219 Gastro-esophageal reflux disease without esophagitis: Secondary | ICD-10-CM | POA: Diagnosis not present

## 2017-01-17 DIAGNOSIS — I4891 Unspecified atrial fibrillation: Secondary | ICD-10-CM | POA: Insufficient documentation

## 2017-01-17 DIAGNOSIS — I739 Peripheral vascular disease, unspecified: Secondary | ICD-10-CM | POA: Diagnosis not present

## 2017-01-17 HISTORY — PX: ABDOMINAL AORTOGRAM W/LOWER EXTREMITY: CATH118223

## 2017-01-17 LAB — POCT I-STAT, CHEM 8
BUN: 33 mg/dL — AB (ref 6–20)
CALCIUM ION: 1.17 mmol/L (ref 1.15–1.40)
CHLORIDE: 105 mmol/L (ref 101–111)
Creatinine, Ser: 1 mg/dL (ref 0.61–1.24)
GLUCOSE: 89 mg/dL (ref 65–99)
HCT: 37 % — ABNORMAL LOW (ref 39.0–52.0)
Hemoglobin: 12.6 g/dL — ABNORMAL LOW (ref 13.0–17.0)
Potassium: 4 mmol/L (ref 3.5–5.1)
Sodium: 140 mmol/L (ref 135–145)
TCO2: 26 mmol/L (ref 22–32)

## 2017-01-17 SURGERY — ABDOMINAL AORTOGRAM W/LOWER EXTREMITY
Anesthesia: LOCAL

## 2017-01-17 MED ORDER — LABETALOL HCL 5 MG/ML IV SOLN: 10.0000 mg | INTRAVENOUS | Status: DC | PRN

## 2017-01-17 MED ORDER — HYDRALAZINE HCL 20 MG/ML IJ SOLN
5.0000 mg | INTRAMUSCULAR | Status: DC | PRN
  Administered 2017-01-17: 5 mg via INTRAVENOUS

## 2017-01-17 MED ORDER — SODIUM CHLORIDE 0.9% FLUSH: 3.0000 mL | Freq: Two times a day (BID) | INTRAVENOUS | Status: DC

## 2017-01-17 MED ORDER — SODIUM CHLORIDE 0.9% FLUSH: 3.0000 mL | INTRAVENOUS | Status: DC | PRN

## 2017-01-17 MED ORDER — LIDOCAINE HCL (PF) 1 % IJ SOLN
INTRAMUSCULAR | Status: AC
  Filled 2017-01-17: qty 30

## 2017-01-17 MED ORDER — SODIUM CHLORIDE 0.9 % IV SOLN: 250.0000 mL | INTRAVENOUS | Status: DC | PRN

## 2017-01-17 MED ORDER — HEPARIN (PORCINE) IN NACL 2-0.9 UNIT/ML-% IJ SOLN
INTRAMUSCULAR | Status: AC
  Filled 2017-01-17: qty 1000

## 2017-01-17 MED ORDER — SODIUM CHLORIDE 0.9 % IV SOLN: INTRAVENOUS | Status: DC

## 2017-01-17 MED ORDER — HYDRALAZINE HCL 20 MG/ML IJ SOLN
INTRAMUSCULAR | Status: AC
  Filled 2017-01-17: qty 1

## 2017-01-17 MED ORDER — SODIUM CHLORIDE 0.9 % WEIGHT BASED INFUSION: 1.0000 mL/kg/h | INTRAVENOUS | Status: DC

## 2017-01-17 MED ORDER — IODIXANOL 320 MG/ML IV SOLN
INTRAVENOUS | Status: DC | PRN
  Administered 2017-01-17: 115 mL via INTRA_ARTERIAL

## 2017-01-17 MED ORDER — LIDOCAINE HCL (PF) 1 % IJ SOLN
INTRAMUSCULAR | Status: DC | PRN
  Administered 2017-01-17: 15 mL

## 2017-01-17 MED ORDER — HEPARIN (PORCINE) IN NACL 2-0.9 UNIT/ML-% IJ SOLN
INTRAMUSCULAR | Status: AC | PRN
  Administered 2017-01-17: 1000 mL via INTRA_ARTERIAL

## 2017-01-17 SURGICAL SUPPLY — 14 items
CATH ANGIO 5F PIGTAIL 65CM (CATHETERS) ×1 IMPLANT
CATH CROSS OVER TEMPO 5F (CATHETERS) ×1 IMPLANT
CATH STRAIGHT 5FR 65CM (CATHETERS) ×1 IMPLANT
COVER PRB 48X5XTLSCP FOLD TPE (BAG) IMPLANT
COVER PROBE 5X48 (BAG) ×2
GUIDEWIRE ANGLED .035X150CM (WIRE) ×1 IMPLANT
KIT MICROINTRODUCER STIFF 5F (SHEATH) ×1 IMPLANT
KIT PV (KITS) ×2 IMPLANT
SHEATH PINNACLE 5F 10CM (SHEATH) ×1 IMPLANT
SYR MEDRAD MARK V 150ML (SYRINGE) ×2 IMPLANT
TRANSDUCER W/STOPCOCK (MISCELLANEOUS) ×2 IMPLANT
TRAY PV CATH (CUSTOM PROCEDURE TRAY) ×2 IMPLANT
TUBING CIL FLEX 10 FLL-RA (TUBING) ×1 IMPLANT
WIRE HITORQ VERSACORE ST 145CM (WIRE) ×1 IMPLANT

## 2017-01-17 NOTE — Op Note (Signed)
   PATIENT: Chad Russell      MRN: [id number] DOB: 1926-09-06    DATE OF PROCEDURE: 01/17/2017  INDICATIONS:    KALEP FULL is a 83 y.o. male who presents with a nonhealing wound on the lateral aspect of his left foot.  He had evidence of tibial artery occlusive disease and I felt that it was reasonable to pursue arteriography to see if there was anything we could do with his anterior tibial artery to him improve perfusion to the foot and also help [place] determine if he could aggressively elevate his leg given that this is likely a venous ulcer.  PROCEDURE:    1.  Ultrasound-guided access to the right common femoral artery 2.  Aortogram with bilateral iliac arteriogram 3.  Selective catheterization of the left external iliac artery with left lower extremity runoff 4.  Retrograde right femoral arteriogram  SURGEON: Judeth Cornfield. Scot Dock, MD, FACS  ANESTHESIA: Local  EBL: Minimal  TECHNIQUE: The patient was taken to the peripheral vascular lab and both groins were prepped and draped in the usual sterile fashion.  Under ultrasound guidance, after the skin was anesthetized, the right common femoral artery was cannulated and a micropuncture sheath introduced over the wire.  This was exchanged for a 5 French sheath over a versa core wire.  A pigtail catheter was positioned at the L1 vertebral body flush aortogram obtained.  Catheter was positioned above the aortic bifurcation and then brought the pigtail catheter exchanged for a crossover catheter.  This was positioned into the left common iliac artery.  An angled Glidewire was advanced into the external iliac artery and the crossover catheter exchanged for a straight catheter.  Selective left external iliac arteriogram was obtained with left lower extremity runoff  This catheter was then removed and a retrograde right femoral arteriogram obtained with right lower extremity runoff patient was then transferred to the holding area for removal  of the sheath.  No immediate occasions were noted.  FINDINGS:   1.  Single renal arteries bilaterally with no significant renal artery stenosis identified. 2.  Widely patent infrarenal aorta, bilateral common iliac arteries, bilateral external iliac arteries, and bilateral hypogastric arteries. 3.  On the left side, which is the side with the ulcer, the common femoral, deep femoral, superficial femoral, popliteal, posterior tibial, and peroneal arteries are patent.  The anterior tibial artery is occluded in the mid leg. 4.  On the right side, the common femoral, superficial femoral, popliteal, posterior tibial, tibioperoneal trunk, and peroneal arteries are patent.  The right anterior tibial artery has severe diffuse disease.  CLINICAL NOTE: The patient should have adequate circulation to heal the wound on the left ankle.  I think this is a venous ulcer and given the findings on the arteriogram I think it would be reasonable for him to elevate his legs and use moderate compression.  Deitra Mayo, MD, FACS [facility]  DATE OF DICTATION:   01/17/2017

## 2017-01-17 NOTE — Telephone Encounter (Signed)
-----   Message from Mena Goes, RN sent at 01/17/2017 10:43 AM EST ----- Regarding: FW: charge   ----- Message ----- From: Angelia Mould, MD Sent: 01/17/2017  10:38 AM To: [facility] Charge Pool Subject: charge                                          PROCEDURE:   1.  Ultrasound-guided access to the right common femoral artery 2.  Aortogram with bilateral iliac arteriogram 3.  Selective catheterization of the left external iliac artery with left lower extremity runoff 4.  Retrograde right femoral arteriogram  SURGEON: Judeth Cornfield. Scot Dock, MD, FACS  He can follow-up as needed. CD

## 2017-01-17 NOTE — Interval H&P Note (Signed)
History and Physical Interval Note:  01/17/2017 9:38 AM  Chad Russell  has presented today for surgery, with the diagnosis of pvd w/ ulcer  The various methods of treatment have been discussed with the patient and family. After consideration of risks, benefits and other options for treatment, the patient has consented to  Procedure(s): ABDOMINAL AORTOGRAM W/LOWER EXTREMITY (N/A) as a surgical intervention .  The patient's history has been reviewed, patient examined, no change in status, stable for surgery.  I have reviewed the patient's chart and labs.  Questions were answered to the patient's satisfaction.     Deitra Mayo

## 2017-01-17 NOTE — Discharge Instructions (Signed)

## 2017-01-17 NOTE — Progress Notes (Signed)
Assumed care of pt from Debbie Hedge, RN.  Assessment documented. 

## 2017-01-17 NOTE — Progress Notes (Signed)
Site area: Scientific laboratory technician Prior to Removal:  Level 0 Pressure Applied For: 20 min Manual:   yes Patient Status During Pull:  A/O Post Pull Site:  Level 0 Post Pull Instructions Given:  Post instructions given and pt understands Post Pull Pulses Present: Doppler pt Rt  Dressing Applied:  Tegaderm and a 4x4 Bedrest begins @ 11:15:00 Comments: Pt leaves cath lab holding area in stable condition. Rt groin is unremarkable. No hematoma or complications. Dressing is CDI.

## 2017-01-18 ENCOUNTER — Encounter (HOSPITAL_COMMUNITY): Payer: Self-pay | Admitting: Vascular Surgery

## 2017-01-18 ENCOUNTER — Ambulatory Visit: Payer: PPO | Admitting: Family Medicine

## 2017-01-18 ENCOUNTER — Ambulatory Visit (HOSPITAL_COMMUNITY)
Admission: RE | Admit: 2017-01-18 | Discharge: 2017-01-18 | Disposition: A | Discharge status: Home / Self Care | Payer: PPO | Source: Ambulatory Visit | Attending: Hematology and Oncology | Admitting: Hematology and Oncology

## 2017-01-18 DIAGNOSIS — N400 Enlarged prostate without lower urinary tract symptoms: Secondary | ICD-10-CM | POA: Insufficient documentation

## 2017-01-18 DIAGNOSIS — N320 Bladder-neck obstruction: Secondary | ICD-10-CM | POA: Diagnosis not present

## 2017-01-18 DIAGNOSIS — J439 Emphysema, unspecified: Secondary | ICD-10-CM | POA: Insufficient documentation

## 2017-01-18 DIAGNOSIS — J61 Pneumoconiosis due to asbestos and other mineral fibers: Secondary | ICD-10-CM | POA: Insufficient documentation

## 2017-01-18 DIAGNOSIS — D472 Monoclonal gammopathy: Secondary | ICD-10-CM

## 2017-01-18 DIAGNOSIS — I700 Atherosclerosis of aorta: Secondary | ICD-10-CM | POA: Insufficient documentation

## 2017-01-18 LAB — GLUCOSE, CAPILLARY: GLUCOSE-CAPILLARY: 83 mg/dL (ref 65–99)

## 2017-01-18 MED ORDER — FLUDEOXYGLUCOSE F - 18 (FDG) INJECTION
7.9500 | Freq: Once | INTRAVENOUS | Status: AC | PRN
  Administered 2017-01-18: 7.95 via INTRAVENOUS

## 2017-01-23 ENCOUNTER — Other Ambulatory Visit: Payer: Self-pay | Admitting: Radiology

## 2017-01-24 ENCOUNTER — Encounter: Payer: Self-pay | Admitting: Family Medicine

## 2017-01-24 ENCOUNTER — Other Ambulatory Visit: Payer: Self-pay

## 2017-01-24 ENCOUNTER — Ambulatory Visit: Payer: PPO | Admitting: Family Medicine

## 2017-01-24 ENCOUNTER — Other Ambulatory Visit: Payer: Self-pay | Admitting: Radiology

## 2017-01-24 VITALS — BP 138/70 | HR 30 | Temp 97.4°F | Resp 18 | Ht 71.0 in | Wt 172.6 lb

## 2017-01-24 DIAGNOSIS — S91002D Unspecified open wound, left ankle, subsequent encounter: Secondary | ICD-10-CM

## 2017-01-24 DIAGNOSIS — I739 Peripheral vascular disease, unspecified: Secondary | ICD-10-CM | POA: Diagnosis not present

## 2017-01-24 NOTE — Patient Instructions (Addendum)
  Ankle wound appears to be healing, and good news with the recent testing. Ok to use compressive stockings (light to moderate intensity) to help with swelling.  Wound should be healing in next 2 weeks.  If any worsening - return for recheck. Let me know if there are questions in the meantime.   IF you received an x-ray today, you will receive an invoice from [facility]. Please contact [facility] at [phone] with questions or concerns regarding your invoice.   IF you received labwork today, you will receive an invoice from [facility]. Please contact [facility] at [phone] with questions or concerns regarding your invoice.   Our billing staff will not be able to assist you with questions regarding bills from these companies.  You will be contacted with the lab results as soon as they are available. The fastest way to get your results is to activate your My Chart account. Instructions are located on the last page of this paperwork. If you have not heard from [place] regarding the results in 2 weeks, please contact this office.

## 2017-01-24 NOTE — Progress Notes (Addendum)
Subjective:  By signing my name below, I, [name], attest that this documentation has been prepared under the direction and in the presence of [name], MD. Electronically Signed: [name], [place]. 01/24/2017 , 9:05 AM .  Patient was seen in Room 11 .   Patient ID: Chad Russell, male    DOB: 11-07-1926, 83 y.o.   MRN: [id number] Chief Complaint  Patient presents with  . Wound Check    recheck on left ankle    HPI Chad Russell is a 83 y.o. male Here for recheck of left ankle wound. See prior visits. There was some concern for some possible peripheral arterial disease/PVD. He was evaluated by vascular surgery. He had an aortogram on Nov 26th, did have right anterior tibial artery disease, and some occlusion of the mid leg anterior on the left, but other vessels should have adequate circulation to heal the wound on the left ankle. Though to be venous ulcer; recommended leg elevation and use moderate compression.   Patient states his left ankle isn't as sore anymore. He's been using soap and warm water to wash the area, as well as applying polysporin and band-aid over the wound. He has compression stockings but hasn't been wearing them.   Patient Active Problem List   Diagnosis Date Noted  . MGUS (monoclonal gammopathy of unknown significance) 01/15/2017  . Chest congestion 11/30/2016  . Cough 11/30/2016  . Acute bronchitis 11/30/2016  . BPH with obstruction/lower urinary tract symptoms 09/07/2016  . Preoperative cardiovascular examination 08/24/2016  . Dysuria 05/13/2016  . Urinary retention 05/13/2016  . Nausea without vomiting 05/13/2016  . Pelvic pain 05/13/2016  . Balance problem 10/31/2013  . OSA (obstructive sleep apnea) 10/31/2013  . BPH (benign prostatic hyperplasia) 08/28/2012  . Atrial fibrillation ([place]) 06/08/2012  . Bacteremia 04/28/2012  . Hyperplasia of prostate with lower urinary tract symptoms (LUTS) 04/28/2012  . Calf pain 04/28/2012  .  Moderate malnutrition ([place]) 04/28/2012  . Influenza-like illness 04/27/2012  . Infection of urinary tract - recurrent 04/26/2012  . NSTEMI (non-ST elevated myocardial infarction) ([place]) 03/15/2011  . Complete heart block ([place]) 03/15/2011  . HTN (hypertension) 03/15/2011   Past Medical History:  Diagnosis Date  . Arthritis   . Atrial fibrillation ([place])   . Balance problem 10/31/2013  . BPH (benign prostatic hyperplasia) 2014   Urology Dr [name] with [facility] [phone]   . Bradycardia    a. baseline HR in the 30's. Asymptomatic - no history of PPM placement.   Marland Kitchen CAD (coronary artery disease)    LHC 03/16/11: Mid LAD 10-20%, proximal circumflex 10%, mid RCA 20%, EF 55%.  . Cataract   . Complete heart block ([place])   . GERD (gastroesophageal reflux disease)    takes occasional  zantac  . Hx of echocardiogram    Echo 02/2011: EF 65-70%, normal wall motion, MAC, mild MR, mild LAE, mild RVE  . Hypertension   . OSA (obstructive sleep apnea)    per patient went to have sleep study done about 2 years ago, bu they never F/U with him about results; but wife reports he still has periods of apnea at night   . Talar fracture    casted no surg   Past Surgical History:  Procedure Laterality Date  . ABDOMINAL AORTOGRAM W/LOWER EXTREMITY N/A 01/17/2017   Procedure: ABDOMINAL AORTOGRAM W/LOWER EXTREMITY;  Surgeon: Angelia Mould, MD;  Location: [facility];  Service: Cardiovascular;  Laterality: N/A;  . COLONOSCOPY  Repeated and normal in 2011  . hemmorhoidectomy    . INGUINAL HERNIA REPAIR Right 02/26/2014   Procedure: LAPAROSCOPIC RIGHT INGUINAL HERNIA REPAIR;  Surgeon: Ralene Ok, MD;  Location: [place];  Service: General;  Laterality: Right;  . INSERTION OF MESH Right 02/26/2014   Procedure: INSERTION OF MESH;  Surgeon: Ralene Ok, MD;  Location: [place];  Service: General;  Laterality: Right;  . LEFT HEART CATHETERIZATION WITH CORONARY ANGIOGRAM N/A 03/16/2011   Procedure: LEFT  HEART CATHETERIZATION WITH CORONARY ANGIOGRAM;  Surgeon: Peter M Martinique, MD;  Location: [facility] CATH LAB;  Service: Cardiovascular;  Laterality: N/A;  . PROSTATE BIOPSY     negative - [facility]  . TRANSURETHRAL RESECTION OF PROSTATE N/A 09/07/2016   Procedure: TRANSURETHRAL RESECTION OF THE PROSTATE (TURP);  Surgeon: Irine Seal, MD;  Location: [facility];  Service: Urology;  Laterality: N/A;   Allergies  Allergen Reactions  . Doxycycline     Upset stomach  . Keflex [Cephalexin]     Abdominal disruption   Prior to Admission medications   Medication Sig Start Date End Date Taking? Authorizing Provider  docusate sodium (COLACE) 100 MG capsule Take 100 mg at bedtime as needed by mouth for mild constipation.   Yes [provider]  hydrocortisone cream 1 % Apply 1 application daily as needed topically for itching.   Yes [provider]  lisinopril-hydrochlorothiazide (PRINZIDE,ZESTORETIC) 10-12.5 MG tablet Take 1 tablet by mouth daily. 12/20/16  Yes Minus Breeding, MD  LUTEIN-ZEAXANTHIN PO Take 1 tablet daily by mouth.    Yes [provider]  MAGNESIUM CITRATE PO Take 250 mg daily by mouth.   Yes [provider]  Misc Natural Products (PROSTATE) CAPS Take 1 tablet daily by mouth. Prostate Plus otc supplement   Yes [provider]  Multiple Vitamin (MULTIVITAMIN WITH MINERALS) TABS tablet Take 1 tablet daily by mouth.   Yes [provider]  Multiple Vitamins-Minerals (PRESERVISION AREDS 2+MULTI VIT PO) Take 1 tablet by mouth 2 (two) times daily.   Yes [provider]  OVER THE COUNTER MEDICATION Take 1 tablet 2 (two) times daily by mouth. Super beta prostate otc supplement   Yes [provider]  Probiotic Product (PROBIOTIC DAILY PO) Take 1 tablet by mouth daily.   Yes [provider]  ranitidine (ZANTAC) 150 MG tablet Take 150 mg daily as needed by mouth for heartburn.   Yes [provider]  timolol  (TIMOPTIC) 0.5 % ophthalmic solution Place 1 drop into both eyes at bedtime.  12/04/15  Yes [provider]  vitamin C (ASCORBIC ACID) 500 MG tablet Take 500 mg by mouth daily.   Yes [provider]  XARELTO 20 MG TABS tablet Take 20 mg daily with supper by mouth.  11/12/16  Yes [provider]   Social History   Socioeconomic History  . Marital status: Married    Spouse name: Mardene Celeste  . Number of children: 0  . Years of education: 60  . Highest education level: Not on file  Social Needs  . Financial resource strain: Not on file  . Food insecurity - worry: Not on file  . Food insecurity - inability: Not on file  . Transportation needs - medical: Not on file  . Transportation needs - non-medical: Not on file  Occupational History  . Occupation: Scientist, product/process development  . Occupation: Civil engineer, contracting  Tobacco Use  . Smoking status: Former Smoker    Packs/day: 1.00    Years: 3.00  Pack years: 3.00    Types: Cigarettes    Last attempt to quit: 03/17/1955    Years since quitting: 61.9  . Smokeless tobacco: Former Network engineer and Sexual Activity  . Alcohol use: Yes    Alcohol/week: 8.4 oz    Types: 7 Cans of beer, 7 Standard drinks or equivalent per week    Comment: daily  . Drug use: No  . Sexual activity: No    Birth control/protection: None  Other Topics Concern  . 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  Has no children.     Review of Systems  Constitutional: Negative for fatigue and unexpected weight change.  Eyes: Negative for visual disturbance.  Respiratory: Negative for cough, chest tightness and shortness of breath.   Cardiovascular: Negative for chest pain, palpitations and leg swelling.  Gastrointestinal: Negative for abdominal pain and blood in stool.    Skin: Positive for wound. Negative for rash.  Neurological: Negative for dizziness, light-headedness and headaches.       Objective:   Physical Exam  Constitutional: He is oriented to person, place, and time. He appears well-developed and well-nourished.  HENT:  Head: Normocephalic and atraumatic.  Eyes: EOM are normal. Pupils are equal, round, and reactive to light.  Neck: No JVD present. Carotid bruit is not present.  Cardiovascular: Normal rate, regular rhythm and normal heart sounds.  No murmur heard. Pulmonary/Chest: Effort normal and breath sounds normal. He has no rales.  Musculoskeletal: He exhibits no edema.  Does have 1+ pitting edema to the mid leg  Neurological: He is alert and oriented to person, place, and time.  Skin: Skin is warm and dry.  Minimal tenderness to the inferior aspect of the healing left lateral ankle wound, 75m across of the center with small eschar without surrounding erythema or warmth  Psychiatric: He has a normal mood and affect.  Vitals reviewed.   Vitals:   01/24/17 0820  BP: 138/70  Pulse: (!) 30  Resp: 18  Temp: (!) 97.4 F (36.3 C)  TempSrc: Oral  SpO2: 99%  Weight: 172 lb 9.6 oz (78.3 kg)  Height: _0  (1.803 m)      Assessment & Plan:   CKASON BENAKis a 83y.o. male Wound of left ankle, subsequent encounter  PVD (peripheral vascular disease) ([place]  Slowly healing, but appears to be healing ankle wound. Reassuring recent arterial testing, suspected peripheral vascular disease/venous ulcer. Continue wound care with cleansing soap and water, covered with bandage after Polysporin ointment, RTC precautions if not continued to improve. Okay to start light to moderate intensity compressive stockings.   Bradycardia noted, history of heart block, asymptomatic, followed by cardiology.  No orders of the defined types were placed in this encounter.  Patient Instructions    Ankle wound appears to be healing, and good news with  the recent testing. Ok to use compressive stockings (light to moderate intensity) to help with swelling.  Wound should be healing in next 2 weeks.  If any worsening - return for recheck. Let me know if there are questions in the meantime.   IF you received an x-ray today, you will [facility]. Please contact [facility] at [phone] questions or concerns regarding  your invoice.   IF you received labwork today, you will receive an invoice from [place]. Please contact [facility] at [phone] with questions or concerns regarding your invoice.   Our billing staff will not be able to assist you with questions regarding bills from these companies.  You will be contacted with the lab results as soon as they are available. The fastest way to get your results is to activate your My Chart account. Instructions are located on the last page of this paperwork. If you have not heard from [place] regarding the results in 2 weeks, please contact this office.      I personally performed the services described in this documentation, which was scribed in my presence. The recorded information has been reviewed and considered for accuracy and completeness, addended by me as needed, and agree with information above.  Signed,   [name], MD [facility].  01/24/17 9:17 AM

## 2017-01-25 ENCOUNTER — Ambulatory Visit (HOSPITAL_COMMUNITY): Payer: PPO

## 2017-01-26 ENCOUNTER — Telehealth: Payer: Self-pay | Admitting: Hematology and Oncology

## 2017-01-26 DIAGNOSIS — H353222 Exudative age-related macular degeneration, left eye, with inactive choroidal neovascularization: Secondary | ICD-10-CM | POA: Diagnosis not present

## 2017-01-26 DIAGNOSIS — H353113 Nonexudative age-related macular degeneration, right eye, advanced atrophic without subfoveal involvement: Secondary | ICD-10-CM | POA: Diagnosis not present

## 2017-01-26 DIAGNOSIS — H353221 Exudative age-related macular degeneration, left eye, with active choroidal neovascularization: Secondary | ICD-10-CM | POA: Diagnosis not present

## 2017-01-26 DIAGNOSIS — H353211 Exudative age-related macular degeneration, right eye, with active choroidal neovascularization: Secondary | ICD-10-CM | POA: Diagnosis not present

## 2017-01-26 NOTE — Telephone Encounter (Signed)
Spoke to patients wife regarding upcoming January appointments. Patients wife called to reschedule appointments.

## 2017-01-28 ENCOUNTER — Ambulatory Visit: Payer: Self-pay | Admitting: Hematology and Oncology

## 2017-01-29 NOTE — Assessment & Plan Note (Signed)
83 y.o. male with a number of comorbidities including peripheral neuropathy, peripheral vascular disease, complete heart block and atrial fibrillation referred to our clinic for discovery of possible monoclonal protein spike on protein electrophoresis and March 2018. At the present time, patient has no other symptoms pointing in the direction of multiple myeloma other than his peripheral neuropathy which may be more easily attributable to his peripheral vascular disease.    Our assessment is positive for presence of monoclonal IgG lambda protein with small amount of the paraprotein in the peripheral blood.  Additional assessment for possible presence of multiple myeloma or amyloidosis will be initiated.  Plan: --PET/CT --Consult IR for bone marrow biopsy . --Return to clinic 2 weeks following bone marrow biopsy to review the findings

## 2017-01-29 NOTE — Progress Notes (Signed)
[facility] Follow-up Visit:  Assessment: MGUS (monoclonal gammopathy of unknown significance) 83 y.o. male with a number of comorbidities including peripheral neuropathy, peripheral vascular disease, complete heart block and atrial fibrillation referred to our clinic for discovery of possible monoclonal protein spike on protein electrophoresis and March 2018. At the present time, patient has no other symptoms pointing in the direction of multiple myeloma other than his peripheral neuropathy which may be more easily attributable to his peripheral vascular disease.    Our assessment is positive for presence of monoclonal IgG lambda protein with small amount of the paraprotein in the peripheral blood.  Additional assessment for possible presence of multiple myeloma or amyloidosis will be initiated.  Plan: --PET/CT --Consult IR for bone marrow biopsy . --Return to clinic 2 weeks following bone marrow biopsy to review the findings  Voice recognition software was used and creation of this note. Despite my best effort at editing the text, some misspelling/errors may have occurred.  Orders Placed This Encounter  Procedures  . NM PET Image Initial (PI) Whole Body    Standing Status:   Future    Number of Occurrences:   1    Standing Expiration Date:   01/11/2018    Order Specific Question:   If indicated for the ordered procedure, I authorize the administration of a radiopharmaceutical per Radiology protocol    Answer:   Yes    Order Specific Question:   Preferred imaging location?    Answer:   [facility]    Order Specific Question:   Radiology Contrast Protocol - do NOT remove file path    Answer:   [email or web address]    Order Specific Question:   Reason for Exam additional comments    Answer:   MGUS, please eval for possible Multiple myeloma evidence  . CT Biopsy    Standing Status:   Future    Standing Expiration Date:    01/11/2018    Order Specific Question:   Lab orders requested (DO NOT place separate lab orders, these will be automatically ordered during procedure specimen collection):    Answer:   Cytology - Non Pap    Comments:   Cytogenetics, multiple myeloma FISH    Order Specific Question:   Lab orders requested (DO NOT place separate lab orders, these will be automatically ordered during procedure specimen collection):    Answer:   Surgical Pathology    Order Specific Question:   Lab orders requested (DO NOT place separate lab orders, these will be automatically ordered during procedure specimen collection):    Answer:   Other    Order Specific Question:   Reason for Exam (SYMPTOM  OR DIAGNOSIS REQUIRED)    Answer:   MGUS, please eval for possible Multiple myeloma evidence    Order Specific Question:   Preferred imaging location?    Answer:   [facility]    Order Specific Question:   Radiology Contrast Protocol - do NOT remove file path    Answer:   [email or web address]  . CT BONE MARROW BIOPSY & ASPIRATION    Standing Status:   Future    Standing Expiration Date:   04/13/2018    Order Specific Question:   Reason for Exam (SYMPTOM  OR DIAGNOSIS REQUIRED)    Answer:   MGUS, please eval for possible Multiple myeloma evidence    Order Specific Question:   Preferred imaging location?    Answer:   [facility]  Order Specific Question:   Radiology Contrast Protocol - do NOT remove file path    Answer:   [email or web address]    Cancer Staging No matching staging information was found for the patient.  All questions were answered.  . The patient knows to call the clinic with any problems, questions or concerns.  This note was electronically signed.    History of Presenting Illness Edmund I Duran is an 83 y.o. male followed in the [facility] for medical gammopathy of unknown significance, referred to [place] by Dr  Alda Berthold.  Patient was being followed by neurology for bilateral lower extremity neuropathy with abnormal gait and loss of balance.  The patient reports the symptoms of progressive neuropathy since 2010.  He also has peripheral vascular disease, possible venous stasis with cutaneous ulcers on both ankles.  Additional complaints include progressive muscle mass loss due to limited mobility due to balance problems.  Patient denies fevers, chills or night sweats.  No significant change in appetite although activity level is diminished as above.  Patient denies chest pain, shortness of breath, or cough.  Denies nausea, vomiting, abdominal pain, diarrhea, or constipation.  No urinary or dermatological complaints.  Review of past medical history significant for obstructive sleep apnea, atrial fibrillation, non-ST elevation myocardial infarction, grade 3 heart block with bradycardia, hypertension, and GERD.  Oncological/hematological History: --Labs, 05/14/16: SPEP --possible, but not definitive monoclonal spike present; IgG 1053, IgA 184, IgM 41 --Labs, 11/09/16: tProt 6.5, Alb 4.1, Ca 9.4, Cr 1.1, AP 54; WBC 5.6, Hgb 11.2, Plt 264 --Labs, 01/04/17: tProt 7.4, Alb 4.0, Ca 9.8, Cr 1.2, AP 58, LDH 199; SPEP -- M-Spike 0.3g/dL, SIFE -- IgG lambda; IgG 1144, IgA 231, IgM 49; kappa 22.9(up), lambda 17.4, KLR 1.32; WBC 5.8, hgb 12.3, Plt 318;   Medical History: Past Medical History:  Diagnosis Date  . Arthritis   . Atrial fibrillation ([place])   . Balance problem 10/31/2013  . BPH (benign prostatic hyperplasia) 2014   Urology Dr [name] with [facility] [phone]   . Bradycardia    a. baseline HR in the 30's. Asymptomatic - no history of PPM placement.   Marland Kitchen CAD (coronary artery disease)    LHC 03/16/11: Mid LAD 10-20%, proximal circumflex 10%, mid RCA 20%, EF 55%.  . Cataract   . Complete heart block ([place])   . GERD (gastroesophageal reflux disease)    takes occasional  zantac  . Hx of echocardiogram    Echo  02/2011: EF 65-70%, normal wall motion, MAC, mild MR, mild LAE, mild RVE  . Hypertension   . OSA (obstructive sleep apnea)    per patient went to have sleep study done about 2 years ago, bu they never F/U with him about results; but wife reports he still has periods of apnea at night   . Talar fracture    casted no surg    Surgical History: Past Surgical History:  Procedure Laterality Date  . ABDOMINAL AORTOGRAM W/LOWER EXTREMITY N/A 01/17/2017   Procedure: ABDOMINAL AORTOGRAM W/LOWER EXTREMITY;  Surgeon: Angelia Mould, MD;  Location: [facility];  Service: Cardiovascular;  Laterality: N/A;  . COLONOSCOPY     Repeated and normal in 2011  . hemmorhoidectomy    . INGUINAL HERNIA REPAIR Right 02/26/2014   Procedure: LAPAROSCOPIC RIGHT INGUINAL HERNIA REPAIR;  Surgeon: Ralene Ok, MD;  Location: [facility];  Service: General;  Laterality: Right;  . INSERTION OF MESH Right 02/26/2014   Procedure: INSERTION OF MESH;  Surgeon:  Ralene Ok, MD;  Location: [facility];  Service: General;  Laterality: Right;  . LEFT HEART CATHETERIZATION WITH CORONARY ANGIOGRAM N/A 03/16/2011   Procedure: LEFT HEART CATHETERIZATION WITH CORONARY ANGIOGRAM;  Surgeon: Peter M Martinique, MD;  Location: [facility] C[facility];  Service: Cardiovascular;  Laterality: N/A;  . PROSTATE BIOPSY     negative - [facility]  . TRANSURETHRAL RESECTION OF PROSTATE N/A 09/07/2016   Procedure: TRANSURETHRAL RESECTION OF THE PROSTATE (TURP);  Surgeon: Irine Seal, MD;  Location: [facility];  Service: Urology;  Laterality: N/A;    Family History: Family History  Problem Relation Age of Onset  . Hypertension Mother   . Multiple sclerosis Sister     Social History: Social History   Socioeconomic History  . Marital status: Married    Spouse name: Mardene Celeste  . Number of children: 0  . Years of education: 12  . Highest education level: Not on file  Social Needs  . Financial resource strain: Not on file  . Food insecurity -  worry: Not on file  . Food insecurity - inability: Not on file  . Transportation needs - medical: Not on file  . Transportation needs - non-medical: Not on file  Occupational History  . Occupation: Scientist, product/process development  . Occupation: Civil engineer, contracting  Tobacco Use  . Smoking status: Former Smoker    Packs/day: 1.00    Years: 3.00    Pack years: 3.00    Types: Cigarettes    Last attempt to quit: 03/17/1955    Years since quitting: 61.9  . Smokeless tobacco: Former Network engineer and Sexual Activity  . Alcohol use: Yes    Alcohol/week: 8.4 oz    Types: 7 Cans of beer, 7 Standard drinks or equivalent per week    Comment: daily  . Drug use: No  . Sexual activity: No    Birth control/protection: None  Other Topics Concern  . 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  Has no children.      Allergies: Allergies  Allergen Reactions  . Doxycycline     Upset stomach  . Keflex [Cephalexin]     Abdominal disruption    Medications:  Current Outpatient Medications  Medication Sig Dispense Refill  . docusate sodium (COLACE) 100 MG capsule Take 100 mg at bedtime as needed by mouth for mild constipation.    . hydrocortisone cream 1 % Apply 1 application daily as needed topically for itching.    Marland Kitchen lisinopril-hydrochlorothiazide (PRINZIDE,ZESTORETIC) 10-12.5 MG tablet Take 1 tablet by mouth daily. 90 tablet 3  . LUTEIN-ZEAXANTHIN PO Take 1 tablet daily by mouth.     Marland Kitchen MAGNESIUM CITRATE PO Take 250 mg daily by mouth.    . Misc Natural Products (PROSTATE) CAPS Take 1 tablet daily by mouth. Prostate Plus otc supplement    . Multiple Vitamin (MULTIVITAMIN WITH MINERALS) TABS tablet Take 1 tablet daily by mouth.    . Multiple Vitamins-Minerals (PRESERVISION AREDS 2+MULTI VIT PO) Take 1 tablet by mouth 2 (two) times  daily.    Marland Kitchen OVER THE COUNTER MEDICATION Take 1 tablet 2 (two) times daily by mouth. Super beta prostate otc supplement    . Probiotic Product (PROBIOTIC DAILY PO) Take 1 tablet by mouth daily.    Marland Kitchen  ranitidine (ZANTAC) 150 MG tablet Take 150 mg daily as needed by mouth for heartburn.    . timolol (TIMOPTIC) 0.5 % ophthalmic solution Place 1 drop into both eyes at bedtime.     . vitamin C (ASCORBIC ACID) 500 MG tablet Take 500 mg by mouth daily.    Alveda Reasons 20 MG TABS tablet Take 20 mg daily with supper by mouth.      No current facility-administered medications for this visit.     Review of Systems: Review of Systems  All other systems reviewed and are negative.    PHYSICAL EXAMINATION Blood pressure (!) 145/51, pulse (!) 38, temperature 97.6 F (36.4 C), temperature source Oral, resp. rate 18, height 5' 11"  (1.803 m), weight 168 lb 12.8 oz (76.6 kg), SpO2 96 %.  ECOG PERFORMANCE STATUS: 1 - Symptomatic but completely ambulatory  Physical Exam  Constitutional: He is oriented to person, place, and time. No distress.  HENT:  Head: Normocephalic and atraumatic.  Mouth/Throat: Oropharynx is clear and moist. No oropharyngeal exudate.  Eyes: Conjunctivae and EOM are normal. Pupils are equal, round, and reactive to light. No scleral icterus.  Neck: No thyromegaly present.  Cardiovascular: Normal rate, regular rhythm and normal heart sounds.  No murmur heard. Pulmonary/Chest: Effort normal and breath sounds normal. No respiratory distress. He has no wheezes. He has no rales.  Abdominal: Soft. Bowel sounds are normal. He exhibits no distension and no mass. There is no tenderness. There is no rebound.  Musculoskeletal: He exhibits edema.  Bilateral lower extremity edema.  Dressing in place on both lower extremities limiting further evaluation.  Lymphadenopathy:    He has no cervical adenopathy.  Neurological: He is alert and oriented to person, place, and time. He displays abnormal  reflex. No cranial nerve deficit.  Symmetric decrease and deep tendon reflexes in both lower extremities.  Skin: No rash noted. He is not diaphoretic. No erythema.     LABORATORY DATA: I have personally reviewed the data as listed: No visits with results within 1 Week(s) from this visit.  Latest known visit with results is:  Appointment on 01/04/2017  Component Date Value Ref Range Status  . WBC 01/04/2017 5.8  4.0 - 10.3 10e3/uL Final  . NEUT# 01/04/2017 3.6  1.5 - 6.5 10e3/uL Final  . HGB 01/04/2017 12.3* 13.0 - 17.1 g/dL Final  . HCT 01/04/2017 37.8* 38.4 - 49.9 % Final  . Platelets 01/04/2017 318  140 - 400 10e3/uL Final  . MCV 01/04/2017 95.5  79.3 - 98.0 fL Final  . MCH 01/04/2017 31.1  27.2 - 33.4 pg Final  . MCHC 01/04/2017 32.5  32.0 - 36.0 g/dL Final  . RBC 01/04/2017 3.96* 4.20 - 5.82 10e6/uL Final  . RDW 01/04/2017 14.4  11.0 - 14.6 % Final  . lymph# 01/04/2017 1.6  0.9 - 3.3 10e3/uL Final  . MONO# 01/04/2017 0.5  0.1 - 0.9 10e3/uL Final  . Eosinophils Absolute 01/04/2017 0.1  0.0 - 0.5 10e3/uL Final  . Basophils Absolute 01/04/2017 0.0  0.0 - 0.1 10e3/uL Final  . NEUT% 01/04/2017 62.2  39.0 - 75.0 % Final  . LYMPH% 01/04/2017 27.2  14.0 - 49.0 % Final  . MONO% 01/04/2017 9.4  0.0 - 14.0 % Final  . EOS% 01/04/2017 0.9  0.0 - 7.0 % Final  . BASO% 01/04/2017 0.3  0.0 - 2.0 % Final  . nRBC 01/04/2017 0  0 - 0 % Final  . Retic % 01/04/2017 1.03  0.80 - 1.80 %  Final  . Retic Ct Abs 01/04/2017 40.79  34.80 - 93.90 10e3/uL Final  . Immature Retic Fract 01/04/2017 4.30  3.00 - 10.60 % Final  . Sodium 01/04/2017 139  136 - 145 mEq/L Final  . Potassium 01/04/2017 4.3  3.5 - 5.1 mEq/L Final  . Chloride 01/04/2017 105  98 - 109 mEq/L Final  . CO2 01/04/2017 26  22 - 29 mEq/L Final  . Glucose 01/04/2017 99  70 - 140 mg/dl Final   Glucose reference range is for nonfasting patients. Fasting glucose reference range is 70- 100.  Marland Kitchen BUN 01/04/2017 20.7  7.0 - 26.0 mg/dL Final  .  Creatinine 01/04/2017 1.2  0.7 - 1.3 mg/dL Final  . Total Bilirubin 01/04/2017 0.46  0.20 - 1.20 mg/dL Final  . Alkaline Phosphatase 01/04/2017 58  40 - 150 U/L Final  . AST 01/04/2017 25  5 - 34 U/L Final  . ALT 01/04/2017 18  0 - 55 U/L Final  . Total Protein 01/04/2017 7.4  6.4 - 8.3 g/dL Final  . Albumin 01/04/2017 4.0  3.5 - 5.0 g/dL Final  . Calcium 01/04/2017 9.8  8.4 - 10.4 mg/dL Final  . Anion Gap 01/04/2017 8  3 - 11 mEq/L Final  . EGFR 01/04/2017 56* >60 ml/min/1.73 m2 Final   eGFR is calculated using the CKD-EPI Creatinine Equation (2009)  . LDH 01/04/2017 199  125 - 245 U/L Final  . Beta-2 01/04/2017 1.9  0.6 - 2.4 mg/L Final   Siemens Immulite 2000 Immunochemiluminometric assay (ICMA)  . IgG, Qn, Serum 01/04/2017 1,144  700 - 1600 mg/dL Final  . IgA, Qn, Serum 01/04/2017 231  61 - 437 mg/dL Final  . IgM, Qn, Serum 01/04/2017 49  15 - 143 mg/dL Final  . Total Protein 01/04/2017 6.8  6.0 - 8.5 g/dL Final  . Albumin SerPl Elph-Mcnc 01/04/2017 4.0  2.9 - 4.4 g/dL Final  . Alpha 1 01/04/2017 0.3  0.0 - 0.4 g/dL Final  . Alpha2 Glob SerPl Elph-Mcnc 01/04/2017 0.6  0.4 - 1.0 g/dL Final  . B-Globulin SerPl Elph-Mcnc 01/04/2017 0.8  0.7 - 1.3 g/dL Final  . Gamma Glob SerPl Elph-Mcnc 01/04/2017 1.1  0.4 - 1.8 g/dL Final  . M Protein SerPl Elph-Mcnc 01/04/2017 0.3* Not Observed g/dL Final  . Globulin, Total 01/04/2017 2.8  2.2 - 3.9 g/dL Final  . Albumin/Glob SerPl 01/04/2017 1.5  0.7 - 1.7 Final  . IFE 1 01/04/2017 Comment   Final   Comment: Immunofixation shows IgG monoclonal protein with lambda light chain specificity.   . Please Note 01/04/2017 Comment   Final   Comment: Protein electrophoresis scan will follow via computer, mail, or courier delivery.   Marland Kitchen PROTEIN,TOTAL,URINE 01/11/2017 33.0  Not Estab. mg/dL Final   Total Volume: 0950 mL  . Prot,24hr calculated 01/11/2017 314* 30 - 150 mg/24 hr Final  . ALBUMIN, U 01/11/2017 50.2  % Final  . ALPHA 1 URINE 01/11/2017 3.6   % Final  . ALPHA-2-GLOBULIN, U 01/11/2017 9.7  % Final  . % BETA, Urine 01/11/2017 17.6  % Final  . GAMMA GLOBULIN URINE 01/11/2017 18.9  % Final  . M-SPIKE, % 01/11/2017 3.2* Not Observed % Final  . M-Spike, mg/24 hr 01/11/2017 10* Not Observed mg/24 hr Final  . Immunofixation Result, Urine 01/11/2017 Comment   Final   Comment: Immunofixation shows IgG monoclonal protein with lambda light chain specificity.   Marland Kitchen NOTE: 01/11/2017 Comment   Final   Comment: Protein electrophoresis scan will  follow via computer, mail, or courier delivery.   . Free Kappa Lt Chains,Ur 01/11/2017 150.00* 1.35 - 24.19 mg/L Final   **Results verified by repeat testing**  . Free Lambda Lt Chains,Ur 01/11/2017 5.25  0.24 - 6.66 mg/L Final  . Kappa/Lambda Ratio,U 01/11/2017 28.57* 2.04 - 10.37 Final  . Ig Kappa Free Light Chain 01/04/2017 22.9* 3.3 - 19.4 mg/L Final  . Ig Lambda Free Light Chain 01/04/2017 17.4  5.7 - 26.3 mg/L Final  . Kappa/Lambda FluidC Ratio 01/04/2017 1.32  0.26 - 1.65 Final  . ANA Titer 1 01/04/2017 Negative   Final   Comment:                                                     Negative   <1:80                                                     Borderline  1:80                                                     Positive   >1:80   . RA Latex Turbid. 01/04/2017 <10.0  0.0 - 13.9 IU/mL Final       Ardath Sax, MD

## 2017-02-03 DIAGNOSIS — H353221 Exudative age-related macular degeneration, left eye, with active choroidal neovascularization: Secondary | ICD-10-CM | POA: Diagnosis not present

## 2017-02-03 DIAGNOSIS — H353113 Nonexudative age-related macular degeneration, right eye, advanced atrophic without subfoveal involvement: Secondary | ICD-10-CM | POA: Diagnosis not present

## 2017-02-03 DIAGNOSIS — H353211 Exudative age-related macular degeneration, right eye, with active choroidal neovascularization: Secondary | ICD-10-CM | POA: Diagnosis not present

## 2017-02-03 DIAGNOSIS — H43812 Vitreous degeneration, left eye: Secondary | ICD-10-CM | POA: Diagnosis not present

## 2017-02-09 ENCOUNTER — Other Ambulatory Visit: Payer: Self-pay | Admitting: Radiology

## 2017-02-10 ENCOUNTER — Ambulatory Visit (HOSPITAL_COMMUNITY)
Admission: RE | Admit: 2017-02-10 | Discharge: 2017-02-10 | Disposition: A | Discharge status: Home / Self Care | Payer: PPO | Source: Ambulatory Visit | Attending: Hematology and Oncology | Admitting: Hematology and Oncology

## 2017-02-10 ENCOUNTER — Encounter (HOSPITAL_COMMUNITY): Payer: Self-pay

## 2017-02-10 DIAGNOSIS — I2510 Atherosclerotic heart disease of native coronary artery without angina pectoris: Secondary | ICD-10-CM | POA: Insufficient documentation

## 2017-02-10 DIAGNOSIS — Z87891 Personal history of nicotine dependence: Secondary | ICD-10-CM | POA: Diagnosis not present

## 2017-02-10 DIAGNOSIS — D472 Monoclonal gammopathy: Secondary | ICD-10-CM | POA: Diagnosis not present

## 2017-02-10 DIAGNOSIS — Z7901 Long term (current) use of anticoagulants: Secondary | ICD-10-CM | POA: Diagnosis not present

## 2017-02-10 DIAGNOSIS — Z820 Family history of epilepsy and other diseases of the nervous system: Secondary | ICD-10-CM | POA: Insufficient documentation

## 2017-02-10 DIAGNOSIS — I10 Essential (primary) hypertension: Secondary | ICD-10-CM | POA: Insufficient documentation

## 2017-02-10 DIAGNOSIS — M1990 Unspecified osteoarthritis, unspecified site: Secondary | ICD-10-CM | POA: Insufficient documentation

## 2017-02-10 DIAGNOSIS — Z79899 Other long term (current) drug therapy: Secondary | ICD-10-CM | POA: Diagnosis not present

## 2017-02-10 DIAGNOSIS — D72822 Plasmacytosis: Secondary | ICD-10-CM | POA: Insufficient documentation

## 2017-02-10 DIAGNOSIS — I442 Atrioventricular block, complete: Secondary | ICD-10-CM | POA: Insufficient documentation

## 2017-02-10 DIAGNOSIS — I4891 Unspecified atrial fibrillation: Secondary | ICD-10-CM | POA: Insufficient documentation

## 2017-02-10 DIAGNOSIS — Z8249 Family history of ischemic heart disease and other diseases of the circulatory system: Secondary | ICD-10-CM | POA: Insufficient documentation

## 2017-02-10 DIAGNOSIS — R001 Bradycardia, unspecified: Secondary | ICD-10-CM | POA: Diagnosis not present

## 2017-02-10 DIAGNOSIS — D649 Anemia, unspecified: Secondary | ICD-10-CM | POA: Diagnosis not present

## 2017-02-10 DIAGNOSIS — Z881 Allergy status to other antibiotic agents status: Secondary | ICD-10-CM | POA: Diagnosis not present

## 2017-02-10 DIAGNOSIS — Z862 Personal history of diseases of the blood and blood-forming organs and certain disorders involving the immune mechanism: Secondary | ICD-10-CM | POA: Diagnosis not present

## 2017-02-10 DIAGNOSIS — K219 Gastro-esophageal reflux disease without esophagitis: Secondary | ICD-10-CM | POA: Insufficient documentation

## 2017-02-10 LAB — CBC WITH DIFFERENTIAL/PLATELET
BASOS ABS: 0 10*3/uL (ref 0.0–0.1)
BASOS PCT: 0 %
EOS ABS: 0.1 10*3/uL (ref 0.0–0.7)
EOS PCT: 1 %
HEMATOCRIT: 35 % — AB (ref 39.0–52.0)
Hemoglobin: 11.3 g/dL — ABNORMAL LOW (ref 13.0–17.0)
Lymphocytes Relative: 35 %
Lymphs Abs: 2.2 10*3/uL (ref 0.7–4.0)
MCH: 30.3 pg (ref 26.0–34.0)
MCHC: 32.3 g/dL (ref 30.0–36.0)
MCV: 93.8 fL (ref 78.0–100.0)
MONO ABS: 1 10*3/uL (ref 0.1–1.0)
MONOS PCT: 16 %
NEUTROS ABS: 3 10*3/uL (ref 1.7–7.7)
Neutrophils Relative %: 48 %
PLATELETS: 222 10*3/uL (ref 150–400)
RBC: 3.73 MIL/uL — ABNORMAL LOW (ref 4.22–5.81)
RDW: 15.9 % — AB (ref 11.5–15.5)
WBC: 6.3 10*3/uL (ref 4.0–10.5)

## 2017-02-10 LAB — PROTIME-INR
INR: 1.35
Prothrombin Time: 16.6 seconds — ABNORMAL HIGH (ref 11.4–15.2)

## 2017-02-10 MED ORDER — MIDAZOLAM HCL 2 MG/2ML IJ SOLN
INTRAMUSCULAR | Status: AC | PRN
  Administered 2017-02-10: 1 mg via INTRAVENOUS

## 2017-02-10 MED ORDER — FENTANYL CITRATE (PF) 100 MCG/2ML IJ SOLN
INTRAMUSCULAR | Status: AC
  Filled 2017-02-10: qty 6

## 2017-02-10 MED ORDER — MIDAZOLAM HCL 2 MG/2ML IJ SOLN
INTRAMUSCULAR | Status: AC
  Filled 2017-02-10: qty 6

## 2017-02-10 MED ORDER — SODIUM CHLORIDE 0.9 % IV SOLN
INTRAVENOUS | Status: DC
  Administered 2017-02-10: 10:00:00 via INTRAVENOUS

## 2017-02-10 MED ORDER — FENTANYL CITRATE (PF) 100 MCG/2ML IJ SOLN
INTRAMUSCULAR | Status: AC | PRN
  Administered 2017-02-10: 50 ug via INTRAVENOUS

## 2017-02-10 NOTE — Consult Note (Signed)
Chief Complaint: Patient was seen in consultation today for CT guided bone marrow biopsy  Referring Physician(s): Perlov,Mikhail G  Supervising Physician: Sandi Mariscal  Patient Status: [facility] - Out-pt  History of Present Illness: Chad Russell is a 83 y.o. male with history of MGUS who presents today for CT guided bone marrow biopsy to rule out myeloma.   Past Medical History:  Diagnosis Date  . Arthritis   . Atrial fibrillation ([place])   . Balance problem 10/31/2013  . BPH (benign prostatic hyperplasia) 2014   Urology Dr [name] with [facility] [phone]   . Bradycardia    a. baseline HR in the 30's. Asymptomatic - no history of PPM placement.   Marland Kitchen CAD (coronary artery disease)    LHC 03/16/11: Mid LAD 10-20%, proximal circumflex 10%, mid RCA 20%, EF 55%.  . Cataract   . Complete heart block ([place])   . GERD (gastroesophageal reflux disease)    takes occasional  zantac  . Hx of echocardiogram    Echo 02/2011: EF 65-70%, normal wall motion, MAC, mild MR, mild LAE, mild RVE  . Hypertension   . OSA (obstructive sleep apnea)    per patient went to have sleep study done about 2 years ago, bu they never F/U with him about results; but wife reports he still has periods of apnea at night   . Talar fracture    casted no surg    Past Surgical History:  Procedure Laterality Date  . ABDOMINAL AORTOGRAM W/LOWER EXTREMITY N/A 01/17/2017   Procedure: ABDOMINAL AORTOGRAM W/LOWER EXTREMITY;  Surgeon: Angelia Mould, MD;  Location: [place] [facility];  Service: Cardiovascular;  Laterality: N/A;  . COLONOSCOPY     Repeated and normal in 2011  . hemmorhoidectomy    . INGUINAL HERNIA REPAIR Right 02/26/2014   Procedure: LAPAROSCOPIC RIGHT INGUINAL HERNIA REPAIR;  Surgeon: Ralene Ok, MD;  Location: [facility];  Service: General;  Laterality: Right;  . INSERTION OF MESH Right 02/26/2014   Procedure: INSERTION OF MESH;  Surgeon: Ralene Ok, MD;  Location: [facility];  Service: General;   Laterality: Right;  . LEFT HEART CATHETERIZATION WITH CORONARY ANGIOGRAM N/A 03/16/2011   Procedure: LEFT HEART CATHETERIZATION WITH CORONARY ANGIOGRAM;  Surgeon: Peter M Martinique, MD;  Location: [facility] CATH LAB;  Service: Cardiovascular;  Laterality: N/A;  . PROSTATE BIOPSY     negative - [facility]  . TRANSURETHRAL RESECTION OF PROSTATE N/A 09/07/2016   Procedure: TRANSURETHRAL RESECTION OF THE PROSTATE (TURP);  Surgeon: Irine Seal, MD;  Location: [facility];  Service: Urology;  Laterality: N/A;    Allergies: Doxycycline and Keflex [cephalexin]  Medications: Prior to Admission medications   Medication Sig Start Date End Date Taking? Authorizing Provider  docusate sodium (COLACE) 100 MG capsule Take 100 mg at bedtime as needed by mouth for mild constipation.    [provider]  hydrocortisone cream 1 % Apply 1 application daily as needed topically for itching.    [provider]  lisinopril-hydrochlorothiazide (PRINZIDE,ZESTORETIC) 10-12.5 MG tablet Take 1 tablet by mouth daily. 12/20/16   Minus Breeding, MD  LUTEIN-ZEAXANTHIN PO Take 1 tablet daily by mouth.     [provider]  MAGNESIUM CITRATE PO Take 250 mg daily by mouth.    [provider]  Misc Natural Products (PROSTATE) CAPS Take 1 tablet daily by mouth. Prostate Plus otc supplement    [provider]  Multiple Vitamin (MULTIVITAMIN WITH MINERALS) TABS tablet Take 1 tablet daily by mouth.  [provider]  Multiple Vitamins-Minerals (PRESERVISION AREDS 2+MULTI VIT PO) Take 1 tablet by mouth 2 (two) times daily.    [provider]  OVER THE COUNTER MEDICATION Take 1 tablet 2 (two) times daily by mouth. Super beta prostate otc supplement    [provider]  Probiotic Product (PROBIOTIC DAILY PO) Take 1 tablet by mouth daily.    [provider]  ranitidine (ZANTAC) 150 MG tablet Take 150 mg daily as needed by mouth for heartburn.    [provider]  timolol (TIMOPTIC) 0.5 % ophthalmic solution Place 1 drop into both eyes at bedtime.  12/04/15   [provider]  vitamin C (ASCORBIC ACID) 500 MG tablet Take 500 mg by mouth daily.    [provider]  XARELTO 20 MG TABS tablet Take 20 mg daily with supper by mouth.  11/12/16   [provider]     Family History  Problem Relation Age of Onset  . Hypertension Mother   . Multiple sclerosis Sister     Social History   Socioeconomic History  . Marital status: Married    Spouse name: Mardene Celeste  . Number of children: 0  . Years of education: 85  . Highest education level: Not on file  Social Needs  . Financial resource strain: Not on file  . Food insecurity - worry: Not on file  . Food insecurity - inability: Not on file  . Transportation needs - medical: Not on file  . Transportation needs - non-medical: Not on file  Occupational History  . Occupation: Scientist, product/process development  . Occupation: Civil engineer, contracting  Tobacco Use  . Smoking status: Former Smoker    Packs/day: 1.00    Years: 3.00    Pack years: 3.00    Types: Cigarettes    Last attempt to quit: 03/17/1955    Years since quitting: 61.9  . Smokeless tobacco: Former Network engineer and Sexual Activity  . Alcohol use: Yes    Alcohol/week: 8.4 oz    Types: 7 Cans of beer, 7 Standard drinks or equivalent per week    Comment: daily  . Drug use: No  . Sexual activity: No    Birth control/protection: None  Other Topics Concern  . 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  Has no children.        Review of Systems denies headache, fever, chest pain, dyspnea, cough, abdominal/back, nausea, vomiting or bleeding  Vital Signs: BP (!) 179/82 (BP Location: Left Arm)   Pulse (!) 35   Temp 98.5 F (36.9 C) (Oral)   Resp  18   Ht _0  (1.803 m)   Wt 171 lb 9.6 oz (77.8 kg)   SpO2 100%   BMI 23.93 kg/m   Physical Exam awake, alert.  Chest clear to auscultation bilaterally.  Heart with regular but markedly bradycardic rhythm; abdomen soft, positive bowel sounds, nontender.  Bilateral lower extremity edema noted  Imaging: Nm Pet Image Initial (pi) Whole Body  Result Date: 01/18/2017 CLINICAL DATA:  Initial treatment strategy for monoclonal gammopathy of unknown significance. EXAM: NUCLEAR MEDICINE PET WHOLE BODY TECHNIQUE: 8.0 mCi F-18 FDG was injected intravenously. Full-ring PET imaging was performed from the vertex to the feet after the radiotracer. CT  data was obtained and used for attenuation correction and anatomic localization. FASTING BLOOD GLUCOSE:  Value: 83 mg/dl COMPARISON:  None. FINDINGS: HEAD/NECK No hypermetabolic activity in the scalp. No hypermetabolic cervical lymph nodes. CHEST No hypermetabolic mediastinal or hilar nodes. A few left upper and lower lobe pulmonary nodules are seen, largest measuring 7 mm on image 31/8. The show no FDG uptake but are too small to characterize by PET. Bilateral calcified pleural plaque, consistent with prior asbestos exposure. Mild emphysema. No evidence of frank pulmonary fibrosis. Moderate cardiomegaly. Aortic and coronary artery atherosclerosis. ABDOMEN/PELVIS No abnormal hypermetabolic activity within the liver, pancreas, adrenal glands, or spleen. No hypermetabolic lymph nodes in the abdomen or pelvis. Hyperdense gallbladder sludge, without evidence of cholecystitis. Enlarged prostate with previous TURP defect. Diffuse bladder wall thickening, most likely due to chronic bladder outlet obstruction. Large colonic stool burden noted. Aortic atherosclerosis. SKELETON No focal hypermetabolic lesions to suggest skeletal malignancy. EXTREMITIES No abnormal hypermetabolic lesions in the lower extremities. IMPRESSION: No evidence of active malignancy. Indeterminate left lung  nodules measuring up to 7 mm. Non-contrast chest CT at 3-6 months is recommended. If the nodules are stable at time of repeat CT, then future CT at 18-24 months (from today's scan) is considered optional for low-risk patients, but is recommended for high-risk patients. This recommendation follows the consensus statement: Guidelines for Management of Incidental Pulmonary Nodules Detected on CT Images: From the Fleischner Society 2017; Radiology 2017; 284:228-243. Enlarged prostate and findings of chronic bladder outlet obstruction. Asbestos related pleural disease. Aortic Atherosclerosis (ICD10-I70.0) and Emphysema (ICD10-J43.9). Electronically Signed   By: Earle Gell M.D.   On: 01/18/2017 16:27    Labs:  CBC: Recent Labs    10/09/16 1806 10/10/16 1651 10/11/16 0547 11/09/16 1746 01/04/17 1407 01/17/17 0729  WBC 10.2 9.1  --  5.6 5.8  --   HGB 10.9* 9.5* 9.2* 11.2* 12.3* 12.6*  HCT 33.3* 28.1* 27.0* 34.1* 37.8* 37.0*  PLT 226 211  --  264 318  --     COAGS: No results for input(s): INR, APTT in the last 8760 hours.  BMP: Recent Labs    09/08/16 0447 10/09/16 1806 10/10/16 1651 10/11/16 0547 11/09/16 1746 01/04/17 1407 01/17/17 0729  NA 139 136 141 139 139 139 140  K 4.6 4.3 4.5 3.9 4.9 4.3 4.0  CL 106 101 106 101 102  --  105  CO2 _0 --  23 26  --   GLUCOSE 102* 111* 121* 122* 89 99 89  BUN 19 24* 33* 33* 22 20.7 33*  CALCIUM 9.0 9.4 9.4  --  9.4 9.8  --   CREATININE 0.99 1.37* 1.38* 1.30* 1.09 1.2 1.00  GFRNONAA >60 44* 44*  --  60  --   --   GFRAA >60 51* 51*  --  70  --   --     LIVER FUNCTION TESTS: Recent Labs    05/13/16 1038 11/09/16 1746 01/04/17 1407 01/04/17 1407  BILITOT 1.4* 0.5 0.46  --   AST _1 --   ALT _2 --   ALKPHOS 51 54 58  --   PROT 6.5 6.5 7.4 6.8  ALBUMIN 4.2 4.1 4.0  --     TUMOR MARKERS: No results for input(s): AFPTM, CEA, CA199, CHROMGRNA in the last 8760 hours.  Assessment and Plan: 83 y.o. male with  history of MGUS who presents today for CT guided bone marrow biopsy to rule out myeloma.  Risks and benefits discussed with the patient/spouse  including, but not limited to bleeding, infection, damage to adjacent structures or low yield requiring additional tests. All of the patient's questions were answered, patient is agreeable to proceed. Consent signed and in chart.     Thank you for this interesting consult.  I greatly enjoyed meeting Chad Russell and look forward to participating in their care.  A copy of this report was sent to the requesting provider on this date.  Electronically Signed: D. Rowe Robert, PA-C 02/10/2017, 9:24 AM   I spent a total of 25 minutes in face to face in clinical consultation, greater than 50% of which was counseling/coordinating care for CT-guided bone marrow biopsy

## 2017-02-10 NOTE — Procedures (Signed)
Pre-procedure Diagnosis: MGUS Post-procedure Diagnosis: Same  Technically successful CT guided bone marrow aspiration and biopsy of right iliac crest.   Complications: None Immediate  EBL: None  SignedSandi Mariscal Pager: [phone] 02/10/2017, 11:45 AM

## 2017-02-24 ENCOUNTER — Ambulatory Visit: Payer: Self-pay | Admitting: Hematology and Oncology

## 2017-02-24 DIAGNOSIS — D2262 Melanocytic nevi of left upper limb, including shoulder: Secondary | ICD-10-CM | POA: Diagnosis not present

## 2017-02-24 DIAGNOSIS — L570 Actinic keratosis: Secondary | ICD-10-CM | POA: Diagnosis not present

## 2017-02-24 DIAGNOSIS — L821 Other seborrheic keratosis: Secondary | ICD-10-CM | POA: Diagnosis not present

## 2017-02-24 DIAGNOSIS — L814 Other melanin hyperpigmentation: Secondary | ICD-10-CM | POA: Diagnosis not present

## 2017-02-24 DIAGNOSIS — D485 Neoplasm of uncertain behavior of skin: Secondary | ICD-10-CM | POA: Diagnosis not present

## 2017-02-24 DIAGNOSIS — C44519 Basal cell carcinoma of skin of other part of trunk: Secondary | ICD-10-CM | POA: Diagnosis not present

## 2017-02-24 DIAGNOSIS — Z85828 Personal history of other malignant neoplasm of skin: Secondary | ICD-10-CM | POA: Diagnosis not present

## 2017-02-24 DIAGNOSIS — L218 Other seborrheic dermatitis: Secondary | ICD-10-CM | POA: Diagnosis not present

## 2017-02-24 DIAGNOSIS — D1801 Hemangioma of skin and subcutaneous tissue: Secondary | ICD-10-CM | POA: Diagnosis not present

## 2017-02-25 ENCOUNTER — Encounter: Payer: Self-pay | Admitting: Neurology

## 2017-02-25 ENCOUNTER — Ambulatory Visit: Payer: PPO | Admitting: Neurology

## 2017-02-25 VITALS — BP 120/70 | HR 33 | Ht 71.0 in | Wt 176.0 lb

## 2017-02-25 DIAGNOSIS — G20 Parkinson's disease: Secondary | ICD-10-CM | POA: Diagnosis not present

## 2017-02-25 DIAGNOSIS — G609 Hereditary and idiopathic neuropathy, unspecified: Secondary | ICD-10-CM | POA: Diagnosis not present

## 2017-02-25 MED ORDER — CARBIDOPA-LEVODOPA 25-100 MG PO TABS
1.0000 | ORAL_TABLET | Freq: Three times a day (TID) | ORAL | 5 refills | Status: DC
Start: 2017-02-25 — End: 2017-04-22

## 2017-02-25 NOTE — Progress Notes (Signed)
Follow-up Visit   Date: 02/25/17    Chad Russell MRN: [id number] DOB: [birth year]   Interim History: Chad Russell is a 84 y.o. right-handed Caucasian male with atrial fibrillation on anticoagulation therapy, hypertension, and BPH returning to the clinic for follow-up of neuropathy and gait imbalance.  The patient was accompanied to the clinic by self.  History of present illness: Starting around 2010, he began having unsteadiness when walking.  He recalls that it started suddenly after working in the yard and developing a rash to poison ivy, but he also noticed two distinct areas, which he thought were spider bites. Within a few hours, he developed visual distortion as if objects were moving slowly.  This visual disturbance improved, but since this time, he feels that his balance has always been off.  He walks independently and has not suffered any falls.  Imbalance is worse in poor lit settings, such as at night and he has placed night lights to assist him, if he needs to get up at night time use the bathroom.  He denies any numbness/tingling of the legs or low back pain.  His wife has also noticed that he tends to walk heavy with his feet.    His wife feels that his voice has become softer, movements are slower, and penmanship has become poor.  Sense of smell is good.   He drinks one glass of wine nightly. 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    UPDATE 05/14/2016:  He reports imbalance being stable without significant worsening and continues to walk unassisted and has not suffered any falls.  He is interested in starting physical therapy.  He denies any new tremor or stiffness.  He went to the ER yesterday with urinary retention and now has a foley catheter and will be seeing urology as follow-up.  No new complaints.   UPDATE 11/18/2016:  He is here for 6 month follow-up visit.  In July, he underwent TURP due  to urinary retention from BPH. He reports having up to mild to severe related to Foley placement and prostate issues, however is pleased with the results of his surgery. At his last visit, I recommended he start physical therapy and see hematology for monoclonal gammopathy, however he was unable to follow-up with this due to ongoing issues with his prostate. Overall, his gait remains unchanged with shuffling pattern and wife reports mild dragging of the left foot. Sometimes, she has noticed a tremor when his arms are outstretched, but not when resting. He denies any stiffness of the muscles or falls. He continues to walk unassisted.  UPDATE 02/25/2017:  He is here for follow-up visit.  At his last visit, I recommended starting sinemet for parkinsonian features, but he did not tolerate the medication, because it made him feel "unwell", but admits to not being on it long enough to appreciate any benefit.  He is interested in restarting this, if necessary.  He denies any worsening of numbness in the feet.  He had not had any falls and no longer has any low back pain.  He was able to see Dr. Lebron Conners with hematology for monoclonal gammopathy and has undergone extensive work-up including PET scan and bone marrow biopsy (results pending).  He also had a poorly healing left foot lesion and because of his arterial disease underwent arteriography of the legs which showed PAD but adequate circulation of the left foot.  Medications:  Current Outpatient Medications on File Prior to Visit  Medication Sig Dispense Refill  . docusate sodium (COLACE) 100 MG capsule Take 100 mg at bedtime as needed by mouth for mild constipation.    . hydrocortisone cream 1 % Apply 1 application daily as needed topically for itching.    Marland Kitchen lisinopril-hydrochlorothiazide (PRINZIDE,ZESTORETIC) 10-12.5 MG tablet Take 1 tablet by mouth daily. 90 tablet 3  . LUTEIN-ZEAXANTHIN PO Take 1 tablet daily by mouth.     Marland Kitchen MAGNESIUM CITRATE PO Take 250 mg  daily by mouth.    . Misc Natural Products (PROSTATE) CAPS Take 1 tablet daily by mouth. Prostate Plus otc supplement    . Multiple Vitamin (MULTIVITAMIN WITH MINERALS) TABS tablet Take 1 tablet daily by mouth.    . Multiple Vitamins-Minerals (PRESERVISION AREDS 2+MULTI VIT PO) Take 1 tablet by mouth 2 (two) times daily.    Marland Kitchen OVER THE COUNTER MEDICATION Take 1 tablet 2 (two) times daily by mouth. Super beta prostate otc supplement    . Probiotic Product (PROBIOTIC DAILY PO) Take 1 tablet by mouth daily.    . ranitidine (ZANTAC) 150 MG tablet Take 150 mg daily as needed by mouth for heartburn.    . timolol (TIMOPTIC) 0.5 % ophthalmic solution Place 1 drop into both eyes at bedtime.     . vitamin C (ASCORBIC ACID) 500 MG tablet Take 500 mg by mouth daily.    Alveda Reasons 20 MG TABS tablet Take 20 mg daily with supper by mouth.      No current facility-administered medications on file prior to visit.     Allergies:  Allergies  Allergen Reactions  . Doxycycline     Upset stomach  . Keflex [Cephalexin]     Abdominal disruption    Review of Systems:  CONSTITUTIONAL: No fevers, chills, night sweats, or weight loss.  EYES: No visual changes or eye pain ENT: No hearing changes.  No history of nose bleeds.   RESPIRATORY: No cough, wheezing and shortness of breath.   CARDIOVASCULAR: Negative for chest pain, and palpitations.   GI: Negative for abdominal discomfort, blood in stools or black stools.  No recent change in bowel habits.   MUSCLOSKELETAL: No history of joint pain or swelling.  No myalgias.   SKIN: Negative for lesions, rash, and itching.   ENDOCRINE: Negative for cold or heat intolerance, polydipsia or goiter.   PSYCH:  No depression or anxiety symptoms.   NEURO: As Above.   Vital Signs:  BP 120/70   Pulse (!) 33   Ht 5' 11"  (1.803 m)   Wt 176 lb (79.8 kg)   BMI 24.55 kg/m   Gen.: Well appearing, comfortable, blunted affect  Neurological Exam: MENTAL STATUS including  orientation to time, place, person, recent and remote memory, attention span and concentration, language, and fund of knowledge is normal.  Speech is soft, not dysarthric, loss of prosody.  CRANIAL NERVES:  Pupils equal round and reactive to light.  Normal conjugate, extra-ocular eye movements in all directions of gaze. Subtle left ptosis.  Face is symmetric.     MOTOR:  Generalized loss of muscle bulk. Motor strength is 5/5 throughout, except mild 5-/5 bilateral toe extension and dorsiflexion (improved).  There is no tremor on exam today.  Cogwheel rigidity is appreciated in the R > LUE  SENSORY:  Vibration is reduced at the knees and absent distal to ankles.  REFLEXES: Reflexes remain 2+/4 in the upper extremities, 3+/4 with spread at the patella  and 2+/4 at the ankles. Left plantar response is extensor.  COORDINATION/GAIT:  Normal finger-to- nose-finger.  There is bradykinesia with reduced rate and amplitude of finger and toe tapping, worse on the right.  Gait shows small, shuffling steps, with reduced arm swing, turns with about 4 steps; unassisted.  Data: NCS/EMG of the legs 12/30/2015: The electrophysiologic findings are most consistent with a distal and symmetric sensorimotor polyneuropathy, axon loss in type, affecting the lower extremities.   Labs 12/29/2015:  SPEP with IFE IgG lambda monoclonal band, vitamin B1 60, vitamin B12 1209, copper  Lab Results  Component Value Date   TSH 3.940 11/09/2016    IMPRESSION/PLAN: 1.  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  Restart sinemet 25/138m half tab TID and titrate to 1 tab TID (instructions provided).  [facility].  Again, I stressed fall precautions and encouraged him to use a cane/walker for support, especially with him on anticoagulation. Fortunately, he  has not sustained any falls but remains at high risk due to combination of neuropathy and Parkinson's disease.  2.  Peripheral axonal neuropathy contributed by peripheral arterial disease.  Clinically, neuropathy is stable and continues to have distal weakness, which is slightly improved, and stocking-glove distribution of paresthesias.    3.  Probable lumbosacral central canal stenosis without neurogenic claudication.  He has hyperreflexia in the legs and extensor response on the left which is most suggestive of lumbosacral pathology.  At this time, he is asymptomatic.  If he develops any new low back pain or worsening distal weakness, low threshold to obtain MRI lumbar spine.    4.  IgG lambda monoclonal gammopathy - appreciate extensive evaluation by hematology.   Return to clinic in 4 months  Greater than 50% of this 25 minute visit was spent in counseling, explanation of diagnosis, planning of further management, and coordination of care.   Thank you for allowing me to participate in patient's care.  If I can answer any additional questions, I would be pleased to do so.    Sincerely,    Nubia Ziesmer K. PPosey Pronto DO

## 2017-02-25 NOTE — Patient Instructions (Addendum)
Start Carbidopa Levodopa as follows at least 30-min prior to meals:     AM  Afternoon   Evening   Week 1:  1/2 tab  1/2 tab   1/2 tab  Week 2:   1/2 tab  1/2 tab   1 tab  Week 3:  1/2 tab  1 tab   1 tab  Week 4:  1 tab  1 tab   1 tab  *Avoid taking with protein products, such as milk, meat, cheese  *if you develop nausea, take with crackers  Start physical therapy for Parkinson's disease  Return to clinic 4 months

## 2017-03-03 ENCOUNTER — Encounter (HOSPITAL_COMMUNITY): Payer: Self-pay

## 2017-03-03 DIAGNOSIS — H353211 Exudative age-related macular degeneration, right eye, with active choroidal neovascularization: Secondary | ICD-10-CM | POA: Diagnosis not present

## 2017-03-03 DIAGNOSIS — H25812 Combined forms of age-related cataract, left eye: Secondary | ICD-10-CM | POA: Diagnosis not present

## 2017-03-03 DIAGNOSIS — H25811 Combined forms of age-related cataract, right eye: Secondary | ICD-10-CM | POA: Diagnosis not present

## 2017-03-04 LAB — TISSUE HYBRIDIZATION (BONE MARROW)-NCBH

## 2017-03-04 LAB — CHROMOSOME ANALYSIS, BONE MARROW

## 2017-03-10 ENCOUNTER — Ambulatory Visit (INDEPENDENT_AMBULATORY_CARE_PROVIDER_SITE_OTHER): Payer: PPO | Admitting: Family Medicine

## 2017-03-10 ENCOUNTER — Ambulatory Visit (INDEPENDENT_AMBULATORY_CARE_PROVIDER_SITE_OTHER): Payer: PPO

## 2017-03-10 ENCOUNTER — Telehealth: Payer: Self-pay | Admitting: Hematology and Oncology

## 2017-03-10 ENCOUNTER — Encounter: Payer: Self-pay | Admitting: Family Medicine

## 2017-03-10 ENCOUNTER — Inpatient Hospital Stay: Payer: PPO | Attending: Hematology and Oncology | Admitting: Hematology and Oncology

## 2017-03-10 ENCOUNTER — Other Ambulatory Visit: Payer: Self-pay

## 2017-03-10 ENCOUNTER — Encounter: Payer: Self-pay | Admitting: Hematology and Oncology

## 2017-03-10 VITALS — BP 132/86 | HR 39 | Temp 98.0°F | Resp 16 | Ht 71.0 in | Wt 171.0 lb

## 2017-03-10 VITALS — BP 127/63 | HR 36 | Temp 97.5°F | Resp 15 | Wt 168.0 lb

## 2017-03-10 DIAGNOSIS — I4891 Unspecified atrial fibrillation: Secondary | ICD-10-CM | POA: Insufficient documentation

## 2017-03-10 DIAGNOSIS — G620 Drug-induced polyneuropathy: Secondary | ICD-10-CM

## 2017-03-10 DIAGNOSIS — D472 Monoclonal gammopathy: Secondary | ICD-10-CM | POA: Diagnosis not present

## 2017-03-10 DIAGNOSIS — Z87891 Personal history of nicotine dependence: Secondary | ICD-10-CM

## 2017-03-10 DIAGNOSIS — R911 Solitary pulmonary nodule: Secondary | ICD-10-CM | POA: Diagnosis not present

## 2017-03-10 DIAGNOSIS — I739 Peripheral vascular disease, unspecified: Secondary | ICD-10-CM | POA: Diagnosis not present

## 2017-03-10 DIAGNOSIS — R059 Cough, unspecified: Secondary | ICD-10-CM

## 2017-03-10 DIAGNOSIS — R05 Cough: Secondary | ICD-10-CM

## 2017-03-10 DIAGNOSIS — J22 Unspecified acute lower respiratory infection: Secondary | ICD-10-CM | POA: Diagnosis not present

## 2017-03-10 DIAGNOSIS — R5383 Other fatigue: Secondary | ICD-10-CM

## 2017-03-10 LAB — POCT CBC
Granulocyte percent: 81.4 %G — AB (ref 37–80)
HCT, POC: 38.3 % — AB (ref 43.5–53.7)
Hemoglobin: 12.6 g/dL — AB (ref 14.1–18.1)
LYMPH, POC: 1.5 (ref 0.6–3.4)
MCH, POC: 30.9 pg (ref 27–31.2)
MCHC: 32.9 g/dL (ref 31.8–35.4)
MCV: 94.1 fL (ref 80–97)
MID (cbc): 1 — AB (ref 0–0.9)
MPV: 6.8 fL (ref 0–99.8)
POC GRANULOCYTE: 10.9 — AB (ref 2–6.9)
POC LYMPH %: 11.1 % (ref 10–50)
POC MID %: 7.5 %M (ref 0–12)
Platelet Count, POC: 260 10*3/uL (ref 142–424)
RBC: 4.07 M/uL — AB (ref 4.69–6.13)
RDW, POC: 16.9 %
WBC: 13.4 10*3/uL — AB (ref 4.6–10.2)

## 2017-03-10 MED ORDER — CEFDINIR 300 MG PO CAPS: 300.0000 mg | ORAL_CAPSULE | Freq: Two times a day (BID) | ORAL | 0 refills | Status: DC

## 2017-03-10 NOTE — Progress Notes (Addendum)
Subjective:  By signing my name below, I, [name], attest that this documentation has been prepared under the direction and in the presence of [name], MD Electronically Signed: Ladene Artist, ED Scribe 03/10/2017 at 5:43 PM.   Patient ID: Chad Russell, male    DOB: 08/25/1926, 84 y.o.   MRN: [id number]  Chief Complaint  Patient presents with  . Cough    with some wheezing and SHOB   . Nasal Congestion    x 3 days    HPI Chad Russell is a 84 y.o. male who presents to Primary Care at [facility]. H/o multiple medical problems including complete heart block. Was seen for bronchitis 11/30/16 after URI symptoms x 1 wk. CXR at that time was without active cardiac disease. Treated with Augmentin. Pt's friend recently returned from the [place] but states that his friend was sick before he traveled. He picked his friend up from the airport 6 days ago. Pt noticed postnasal drip and congestion a few days prior to onset of fatigue, dry cough, wheezing with lying at night x 3 days. Has been taking Alka Seltzer plus since yesterday. Denies fever, sob, cp, decreased urine volume, new leg swelling. No other sick contacts other than friend who recently returned from the [facility].  Occasional chills, no fever. No shaking chills. No urinary symptoms. No confusion.    Patient Active Problem List   Diagnosis Date Noted  . MGUS (monoclonal gammopathy of unknown significance) 01/15/2017  . Chest congestion 11/30/2016  . Cough 11/30/2016  . Acute bronchitis 11/30/2016  . BPH with obstruction/lower urinary tract symptoms 09/07/2016  . Preoperative cardiovascular examination 08/24/2016  . Dysuria 05/13/2016  . Urinary retention 05/13/2016  . Nausea without vomiting 05/13/2016  . Pelvic pain 05/13/2016  . Balance problem 10/31/2013  . OSA (obstructive sleep apnea) 10/31/2013  . BPH (benign prostatic hyperplasia) 08/28/2012  . Atrial fibrillation ([place]) 06/08/2012  . Bacteremia 04/28/2012    . Hyperplasia of prostate with lower urinary tract symptoms (LUTS) 04/28/2012  . Calf pain 04/28/2012  . Moderate malnutrition ([place]) 04/28/2012  . Influenza-like illness 04/27/2012  . Infection of urinary tract - recurrent 04/26/2012  . NSTEMI (non-ST elevated myocardial infarction) ([place]) 03/15/2011  . Complete heart block ([place]) 03/15/2011  . HTN (hypertension) 03/15/2011   Past Medical History:  Diagnosis Date  . Arthritis   . Atrial fibrillation ([place])   . Balance problem 10/31/2013  . BPH (benign prostatic hyperplasia) 2014   Urology Dr [name] with [facility] [phone]   . Bradycardia    a. baseline HR in the 30's. Asymptomatic - no history of PPM placement.   Marland Kitchen CAD (coronary artery disease)    LHC 03/16/11: Mid LAD 10-20%, proximal circumflex 10%, mid RCA 20%, EF 55%.  . Cataract   . Complete heart block ([place])   . GERD (gastroesophageal reflux disease)    takes occasional  zantac  . Hx of echocardiogram    Echo 02/2011: EF 65-70%, normal wall motion, MAC, mild MR, mild LAE, mild RVE  . Hypertension   . OSA (obstructive sleep apnea)    per patient went to have sleep study done about 2 years ago, bu they never F/U with him about results; but wife reports he still has periods of apnea at night   . Talar fracture    casted no surg   Past Surgical History:  Procedure Laterality Date  . ABDOMINAL AORTOGRAM W/LOWER EXTREMITY N/A 01/17/2017   Procedure: ABDOMINAL AORTOGRAM W/LOWER EXTREMITY;  Surgeon: Angelia Mould, MD;  Location: [place] [facility];  Service: Cardiovascular;  Laterality: N/A;  . COLONOSCOPY     Repeated and normal in 2011  . hemmorhoidectomy    . INGUINAL HERNIA REPAIR Right 02/26/2014   Procedure: LAPAROSCOPIC RIGHT INGUINAL HERNIA REPAIR;  Surgeon: Ralene Ok, MD;  Location: [place];  Service: General;  Laterality: Right;  . INSERTION OF MESH Right 02/26/2014   Procedure: INSERTION OF MESH;  Surgeon: Ralene Ok, MD;  Location: [place];  Service: General;   Laterality: Right;  . LEFT HEART CATHETERIZATION WITH CORONARY ANGIOGRAM N/A 03/16/2011   Procedure: LEFT HEART CATHETERIZATION WITH CORONARY ANGIOGRAM;  Surgeon: Peter M Martinique, MD;  Location: [facility] CATH LAB;  Service: Cardiovascular;  Laterality: N/A;  . PROSTATE BIOPSY     negative - [facility]  . TRANSURETHRAL RESECTION OF PROSTATE N/A 09/07/2016   Procedure: TRANSURETHRAL RESECTION OF THE PROSTATE (TURP);  Surgeon: Irine Seal, MD;  Location: [facility];  Service: Urology;  Laterality: N/A;   Allergies  Allergen Reactions  . Doxycycline     Upset stomach  . Keflex [Cephalexin]     Abdominal disruption   Prior to Admission medications   Medication Sig Start Date End Date Taking? Authorizing Provider  carbidopa-levodopa (SINEMET IR) 25-100 MG tablet Take 1 tablet by mouth 3 (three) times daily. 02/25/17   Narda Amber K, DO  docusate sodium (COLACE) 100 MG capsule Take 100 mg at bedtime as needed by mouth for mild constipation.    [provider]  hydrocortisone cream 1 % Apply 1 application daily as needed topically for itching.    [provider]  lisinopril-hydrochlorothiazide (PRINZIDE,ZESTORETIC) 10-12.5 MG tablet Take 1 tablet by mouth daily. 12/20/16   Minus Breeding, MD  LUTEIN-ZEAXANTHIN PO Take 1 tablet daily by mouth.     [provider]  MAGNESIUM CITRATE PO Take 250 mg daily by mouth.    [provider]  Misc Natural Products (PROSTATE) CAPS Take 1 tablet daily by mouth. Prostate Plus otc supplement    [provider]  Multiple Vitamin (MULTIVITAMIN WITH MINERALS) TABS tablet Take 1 tablet daily by mouth.    [provider]  Multiple Vitamins-Minerals (PRESERVISION AREDS 2+MULTI VIT PO) Take 1 tablet by mouth 2 (two) times daily.    [provider]  OVER THE COUNTER MEDICATION Take 1 tablet 2 (two) times daily by mouth. Super beta prostate otc supplement    [provider]  Probiotic Product (PROBIOTIC  DAILY PO) Take 1 tablet by mouth daily.    [provider]  ranitidine (ZANTAC) 150 MG tablet Take 150 mg daily as needed by mouth for heartburn.    [provider]  timolol (TIMOPTIC) 0.5 % ophthalmic solution Place 1 drop into both eyes at bedtime.  12/04/15   [provider]  vitamin C (ASCORBIC ACID) 500 MG tablet Take 500 mg by mouth daily.    [provider]  XARELTO 20 MG TABS tablet Take 20 mg daily with supper by mouth.  11/12/16   [provider]   Social History   Socioeconomic History  . Marital status: Married    Spouse name: Mardene Celeste  . Number of children: 0  . Years of education: 31  . Highest education level: Not on file  Social Needs  . Financial resource strain: Not on file  . Food insecurity - worry: Not on file  . Food insecurity - inability: Not on file  . Transportation needs -  medical: Not on file  . Transportation needs - non-medical: Not on file  Occupational History  . Occupation: Scientist, product/process development  . Occupation: Civil engineer, contracting  Tobacco Use  . Smoking status: Former Smoker    Packs/day: 1.00    Years: 3.00    Pack years: 3.00    Types: Cigarettes    Last attempt to quit: 03/17/1955    Years since quitting: 62.0  . Smokeless tobacco: Former Network engineer and Sexual Activity  . Alcohol use: Yes    Alcohol/week: 8.4 oz    Types: 7 Cans of beer, 7 Standard drinks or equivalent per week    Comment: daily  . Drug use: No  . Sexual activity: No    Birth control/protection: None  Other Topics Concern  . 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  Has no children.     Review of Systems  Constitutional: Positive for fatigue.  HENT: Positive for congestion.   Respiratory: Positive for cough and wheezing. Negative for shortness  of breath.   Cardiovascular: Negative for chest pain and leg swelling.  Genitourinary: Negative for decreased urine volume, dysuria, frequency, hematuria and urgency.      Objective:   Physical Exam  Constitutional: He is oriented to person, place, and time. He appears well-developed and well-nourished.  HENT:  Head: Normocephalic and atraumatic.  Right Ear: Tympanic membrane, external ear and ear canal normal.  Left Ear: Tympanic membrane, external ear and ear canal normal.  Nose: No rhinorrhea.  Mouth/Throat: Oropharynx is clear and moist. Mucous membranes are dry (slightly dry oral mucosa). No oropharyngeal exudate or posterior oropharyngeal erythema.  Eyes: Conjunctivae are normal. Pupils are equal, round, and reactive to light.  Neck: Neck supple.  Cardiovascular: Normal rate, regular rhythm, normal heart sounds and intact distal pulses.  No murmur heard. Pulmonary/Chest: Effort normal and breath sounds normal. No respiratory distress. He has no wheezes. He has no rhonchi. He has no rales.  Few coarse breath sounds at the L base only.  Abdominal: Soft. There is no tenderness.  Musculoskeletal:  Pedal edema bilaterally.  Lymphadenopathy:    He has no cervical adenopathy.  Neurological: He is alert and oriented to person, place, and time.  Skin: Skin is warm and dry. No rash noted.  Psychiatric: He has a normal mood and affect. His behavior is normal.  Vitals reviewed.  Vitals:   03/10/17 1729  BP: 132/86  Pulse: (!) 39  Resp: 16  Temp: 98 F (36.7 C)  TempSrc: Oral  SpO2: 98%  Weight: 171 lb (77.6 kg)  Height: _0  (1.803 m)       Dg Chest 2 View  Result Date: 03/10/2017 CLINICAL DATA:  Cough with wheezing EXAM: CHEST  2 VIEW COMPARISON:  PET-CT 01/18/2017, radiograph 12/16/2016 FINDINGS: Hyperinflation. No acute consolidation or pleural effusion. Bilateral calcified pleural plaques. Stable cardiomediastinal silhouette with aortic atherosclerosis. No pneumothorax.  IMPRESSION: Hyperinflation. No focal pulmonary infiltrate. Calcified pleural plaques consistent with prior asbestos exposure Electronically Signed   By: Donavan Foil M.D.   On: 03/10/2017 18:27   Results for orders placed or performed in visit on 40/34/74  Basic metabolic panel  Result Value Ref Range   Glucose 115 (H) 65 - 99 mg/dL   BUN  31 (H) 8 - 27 mg/dL   Creatinine, Ser 1.15 0.76 - 1.27 mg/dL   GFR calc non Af Amer 56 (L) >59 mL/min/1.73   GFR calc Af Amer 65 >59 mL/min/1.73   BUN/Creatinine Ratio 27 (H) 10 - 24   Sodium 140 134 - 144 mmol/L   Potassium 4.6 3.5 - 5.2 mmol/L   Chloride 100 96 - 106 mmol/L   CO2 24 20 - 29 mmol/L   Calcium 9.1 8.6 - 10.2 mg/dL  POCT CBC  Result Value Ref Range   WBC 13.4 (A) 4.6 - 10.2 K/uL   Lymph, poc 1.5 0.6 - 3.4   POC LYMPH PERCENT 11.1 10 - 50 %L   MID (cbc) 1.0 (A) 0 - 0.9   POC MID % 7.5 0 - 12 %M   POC Granulocyte 10.9 (A) 2 - 6.9   Granulocyte percent 81.4 (A) 37 - 80 %G   RBC 4.07 (A) 4.69 - 6.13 M/uL   Hemoglobin 12.6 (A) 14.1 - 18.1 g/dL   HCT, POC 38.3 (A) 43.5 - 53.7 %   MCV 94.1 80 - 97 fL   MCH, POC 30.9 27 - 31.2 pg   MCHC 32.9 31.8 - 35.4 g/dL   RDW, POC 16.9 %   Platelet Count, POC 260 142 - 424 K/uL   MPV 6.8 0 - 99.8 fL      Assessment & Plan:    Chad Russell is a 84 y.o. male Cough - Plan: POCT CBC, DG Chest 2 View, cefdinir (OMNICEF) 300 MG capsule  Fatigue, unspecified type - Plan: POCT CBC, Basic metabolic panel, cefdinir (OMNICEF) 300 MG capsule  LRTI (lower respiratory tract infection) - Plan: cefdinir (OMNICEF) 300 MG capsule  X-ray without focal infiltrate, but based on symptoms, leukocytosis, concerning for early community acquired pneumonia. With complete heart block, decided against quinolone or azithromycin. Has tolerated Augmentin, and only GI upset with Keflex in the past. No confusion, reassuring respiratory rate, blood pressure stable. Initial outpatient treatment  -Omnicef 300 mg twice  a day, recheck in 48 hours, maintain hydration and RTC/ER precautions given if worsening  -BMP obtained to evaluate renal function, reassuring as above.  Meds ordered this encounter  Medications  . cefdinir (OMNICEF) 300 MG capsule    Sig: Take 1 capsule (300 mg total) by mouth 2 (two) times daily.    Dispense:  20 capsule    Refill:  0   Patient Instructions   Start Omnicef twice per day, make sure to drink plenty of fluids and recheck in 2 days. Return to the clinic or go to the nearest emergency room if any of your symptoms worsen or new symptoms occur.   Cough, Adult Coughing is a reflex that clears your throat and your airways. Coughing helps to heal and protect your lungs. It is normal to cough occasionally, but a cough that happens with other symptoms or lasts a long time may be a sign of a condition that needs treatment. A cough may last only 2-3 weeks (acute), or it may last longer than 8 weeks (chronic). What are the causes? Coughing is commonly caused by:  Breathing in substances that irritate your lungs.  A viral or bacterial respiratory infection.  Allergies.  Asthma.  Postnasal drip.  Smoking.  Acid backing up from the stomach into the esophagus (gastroesophageal reflux).  Certain medicines.  Chronic lung problems, including COPD (or rarely, lung cancer).  Other medical conditions such as heart failure.  Follow these instructions  at home: Pay attention to any changes in your symptoms. Take these actions to help with your discomfort:  Take medicines only as told by your health care provider. ? If you were prescribed an antibiotic medicine, take it as told by your health care provider. Do not stop taking the antibiotic even if you start to feel better. ? Talk with your health care provider before you take a cough suppressant medicine.  Drink enough fluid to keep your urine clear or pale yellow.  If the air is dry, use a cold steam vaporizer or humidifier  in your bedroom or your home to help loosen secretions.  Avoid anything that causes you to cough at work or at home.  If your cough is worse at night, try sleeping in a semi-upright position.  Avoid cigarette smoke. If you smoke, quit smoking. If you need help quitting, ask your health care provider.  Avoid caffeine.  Avoid alcohol.  Rest as needed.  Contact a health care provider if:  You have new symptoms.  You cough up pus.  Your cough does not get better after 2-3 weeks, or your cough gets worse.  You cannot control your cough with suppressant medicines and you are losing sleep.  You develop pain that is getting worse or pain that is not controlled with pain medicines.  You have a fever.  You have unexplained weight loss.  You have night sweats. Get help right away if:  You cough up blood.  You have difficulty breathing.  Your heartbeat is very fast. This information is not intended to replace advice given to you by your health care provider. Make sure you discuss any questions you have with your health care provider. Document Released: 08/07/2010 Document Revised: 07/17/2015 Document Reviewed: 04/17/2014 Elsevier Interactive Patient Education  2018 [employer].    IF you received an x-ray today, you will receive an invoice from [facility]. Please contact [facility] at [phone] with questions or concerns regarding your invoice.   IF you received labwork today, you will receive an invoice from [place]. Please contact [facility] at [phone] with questions or concerns regarding your invoice.   Our billing staff will not be able to assist you with questions regarding bills from these companies.  You will be contacted with the lab results as soon as they are available. The fastest way to get your results is to activate your My Chart account. Instructions are located on the last page of this paperwork. If you have not heard from [place]  regarding the results in 2 weeks, please contact this office.

## 2017-03-10 NOTE — Telephone Encounter (Signed)
Gave avs and calendar for January and april °

## 2017-03-10 NOTE — Patient Instructions (Addendum)
Start Omnicef twice per day, make sure to drink plenty of fluids and recheck in 2 days. Return to the clinic or go to the nearest emergency room if any of your symptoms worsen or new symptoms occur.   Cough, Adult Coughing is a reflex that clears your throat and your airways. Coughing helps to heal and protect your lungs. It is normal to cough occasionally, but a cough that happens with other symptoms or lasts a long time may be a sign of a condition that needs treatment. A cough may last only 2-3 weeks (acute), or it may last longer than 8 weeks (chronic). What are the causes? Coughing is commonly caused by:  Breathing in substances that irritate your lungs.  A viral or bacterial respiratory infection.  Allergies.  Asthma.  Postnasal drip.  Smoking.  Acid backing up from the stomach into the esophagus (gastroesophageal reflux).  Certain medicines.  Chronic lung problems, including COPD (or rarely, lung cancer).  Other medical conditions such as heart failure.  Follow these instructions at home: Pay attention to any changes in your symptoms. Take these actions to help with your discomfort:  Take medicines only as told by your health care provider. ? If you were prescribed an antibiotic medicine, take it as told by your health care provider. Do not stop taking the antibiotic even if you start to feel better. ? Talk with your health care provider before you take a cough suppressant medicine.  Drink enough fluid to keep your urine clear or pale yellow.  If the air is dry, use a cold steam vaporizer or humidifier in your bedroom or your home to help loosen secretions.  Avoid anything that causes you to cough at work or at home.  If your cough is worse at night, try sleeping in a semi-upright position.  Avoid cigarette smoke. If you smoke, quit smoking. If you need help quitting, ask your health care provider.  Avoid caffeine.  Avoid alcohol.  Rest as needed.  Contact a  health care provider if:  You have new symptoms.  You cough up pus.  Your cough does not get better after 2-3 weeks, or your cough gets worse.  You cannot control your cough with suppressant medicines and you are losing sleep.  You develop pain that is getting worse or pain that is not controlled with pain medicines.  You have a fever.  You have unexplained weight loss.  You have night sweats. Get help right away if:  You cough up blood.  You have difficulty breathing.  Your heartbeat is very fast. This information is not intended to replace advice given to you by your health care provider. Make sure you discuss any questions you have with your health care provider. Document Released: 08/07/2010 Document Revised: 07/17/2015 Document Reviewed: 04/17/2014 Elsevier Interactive Patient Education  2018 [employer].    IF you received an x-ray today, you will receive an invoice from [facility] Radiology. Please contact [facility]iology at [phone] with questions or concerns regarding your invoice.   IF you received labwork today, you will receive an invoice from [place]. Please contact [facility] at [phone] with questions or concerns regarding your invoice.   Our billing staff will not be able to assist you with questions regarding bills from these companies.  You will be contacted with the lab results as soon as they are available. The fastest way to get your results is to activate your My Chart account. Instructions are located on the last page of  this paperwork. If you have not heard from [place] regarding the results in 2 weeks, please contact this office.

## 2017-03-11 LAB — BASIC METABOLIC PANEL
BUN/Creatinine Ratio: 27 — ABNORMAL HIGH (ref 10–24)
BUN: 31 mg/dL — ABNORMAL HIGH (ref 8–27)
CALCIUM: 9.1 mg/dL (ref 8.6–10.2)
CHLORIDE: 100 mmol/L (ref 96–106)
CO2: 24 mmol/L (ref 20–29)
Creatinine, Ser: 1.15 mg/dL (ref 0.76–1.27)
GFR calc Af Amer: 65 mL/min/{1.73_m2} (ref >59)
GFR calc non Af Amer: 56 mL/min/{1.73_m2} — ABNORMAL LOW (ref >59)
GLUCOSE: 115 mg/dL — AB (ref 65–99)
POTASSIUM: 4.6 mmol/L (ref 3.5–5.2)
Sodium: 140 mmol/L (ref 134–144)

## 2017-03-12 ENCOUNTER — Encounter: Payer: Self-pay | Admitting: Family Medicine

## 2017-03-12 ENCOUNTER — Other Ambulatory Visit: Payer: Self-pay

## 2017-03-12 ENCOUNTER — Ambulatory Visit: Payer: PPO | Admitting: Family Medicine

## 2017-03-12 VITALS — BP 131/66 | HR 39 | Resp 16 | Ht 71.0 in | Wt 171.0 lb

## 2017-03-12 DIAGNOSIS — J189 Pneumonia, unspecified organism: Secondary | ICD-10-CM | POA: Diagnosis not present

## 2017-03-12 LAB — POCT CBC
Granulocyte percent: 84.4 %G — AB (ref 37–80)
HCT, POC: 36.4 % — AB (ref 43.5–53.7)
Hemoglobin: 12 g/dL — AB (ref 14.1–18.1)
Lymph, poc: 0.8 (ref 0.6–3.4)
MCH, POC: 30.3 pg (ref 27–31.2)
MCHC: 33.1 g/dL (ref 31.8–35.4)
MCV: 91.5 fL (ref 80–97)
MID (cbc): 0.8 (ref 0–0.9)
MPV: 6.5 fL (ref 0–99.8)
PLATELET COUNT, POC: 290 10*3/uL (ref 142–424)
POC Granulocyte: 8.7 — AB (ref 2–6.9)
POC LYMPH %: 10.3 % (ref 10–50)
POC MID %: 8.1 %M (ref 0–12)
RBC: 3.97 M/uL — AB (ref 4.69–6.13)
RDW, POC: 17.1 %
WBC: 10.3 10*3/uL — AB (ref 4.6–10.2)

## 2017-03-12 MED ORDER — GUAIFENESIN ER 1200 MG PO TB12: 1.0000 | ORAL_TABLET | Freq: Two times a day (BID) | ORAL | 1 refills | Status: DC | PRN

## 2017-03-12 MED ORDER — BENZONATATE 200 MG PO CAPS: 200.0000 mg | ORAL_CAPSULE | Freq: Three times a day (TID) | ORAL | 0 refills | Status: DC | PRN

## 2017-03-12 NOTE — Progress Notes (Addendum)
Subjective:  By signing my name below, I, Chad Russell, attest that this documentation has been prepared under the direction and in the presence of Chad Cheadle, MD. Electronically Signed: [name], [place]. 03/12/2017 , 1:51 PM .  Patient was seen in Room 1 .   Patient ID: Chad Russell, male    DOB: [birth year], 84 y.o.   MRN: [id number] Chief Complaint  Patient presents with  . Cough    re check from 03/10/17/ feels better today   HPI Chad Russell is a 84 y.o. male who presents to Primary Care at [facility] complaining of cough. Patient was seen 2 days ago for cough; here today for recheck. He reports he's feeling better today. Patient states he has an urge to cough when he takes a deep breath, usually non productive. He notes having 4 days of congestion prior to cough starting, but this has improved. He's been able to sleep at night, but still feels fatigue throughout. He's able to eat and drink okay, as well as drinking more fluids. He mentions hearing a wheeze and rattling when breathing at night. He denies shortness of breath. He denies history of asthma or inhaler use. He denies taking Mucinex. He is currently taking Alka-Seltzer cough and cold every 4 hours, able to slow down the cough.   Past Medical History:  Diagnosis Date  . Arthritis   . Atrial fibrillation ([place])   . Balance problem 10/31/2013  . BPH (benign prostatic hyperplasia) 2014   Urology Dr Chad Russell with [facility] [phone]   . Bradycardia    a. baseline HR in the 30's. Asymptomatic - no history of PPM placement.   Marland Kitchen CAD (coronary artery disease)    LHC 03/16/11: Mid LAD 10-20%, proximal circumflex 10%, mid RCA 20%, EF 55%.  . Cataract   . Complete heart block ([place])   . GERD (gastroesophageal reflux disease)    takes occasional  zantac  . Hx of echocardiogram    Echo 02/2011: EF 65-70%, normal wall motion, MAC, mild MR, mild LAE, mild RVE  . Hypertension   . OSA (obstructive sleep apnea)    per patient went to  have sleep study done about 2 years ago, bu they never F/U with him about results; but wife reports he still has periods of apnea at night   . Talar fracture    casted no surg   Past Surgical History:  Procedure Laterality Date  . ABDOMINAL AORTOGRAM W/LOWER EXTREMITY N/A 01/17/2017   Procedure: ABDOMINAL AORTOGRAM W/LOWER EXTREMITY;  Surgeon: Angelia Mould, MD;  Location: [facility];  Service: Cardiovascular;  Laterality: N/A;  . COLONOSCOPY     Repeated and normal in 2011  . hemmorhoidectomy    . INGUINAL HERNIA REPAIR Right 02/26/2014   Procedure: LAPAROSCOPIC RIGHT INGUINAL HERNIA REPAIR;  Surgeon: Chad Ok, MD;  Location: [facility];  Service: General;  Laterality: Right;  . INSERTION OF MESH Right 02/26/2014   Procedure: INSERTION OF MESH;  Surgeon: Chad Ok, MD;  Location: [facility];  Service: General;  Laterality: Right;  . LEFT HEART CATHETERIZATION WITH CORONARY ANGIOGRAM N/A 03/16/2011   Procedure: LEFT HEART CATHETERIZATION WITH CORONARY ANGIOGRAM;  Surgeon: Chad M Martinique, MD;  Location[facility];  Service: Cardiovascular;  Laterality: N/A;  . PROSTATE BIOPSY     negative - [facility]  . TRANSURETHRAL RESECTION OF PROSTATE N/A 09/07/2016   Procedure: TRANSURETHRAL RESECTION OF THE PROSTATE (TURP);  Surgeon: Chad Seal, MD;  Location: [facility];  Service: Urology;  Laterality: N/A;   Prior to Admission medications   Medication Sig Start Date End Date Taking? Authorizing Provider  carbidopa-levodopa (SINEMET IR) 25-100 MG tablet Take 1 tablet by mouth 3 (three) times daily. 02/25/17  Yes Patel, Donika K, DO  cefdinir (OMNICEF) 300 MG capsule Take 1 capsule (300 mg total) by mouth 2 (two) times daily. 03/10/17  Yes Chad Agreste, MD  docusate sodium (COLACE) 100 MG capsule Take 100 mg at bedtime as needed by mouth for mild constipation.   Yes [provider]  hydrocortisone cream 1 % Apply 1 application daily as needed topically for itching.   Yes  [provider]  lisinopril-hydrochlorothiazide (PRINZIDE,ZESTORETIC) 10-12.5 MG tablet Take 1 tablet by mouth daily. 12/20/16  Yes Chad Breeding, MD  LUTEIN-ZEAXANTHIN PO Take 1 tablet daily by mouth.    Yes [provider]  MAGNESIUM CITRATE PO Take 250 mg daily by mouth.   Yes [provider]  Misc Natural Products (PROSTATE) CAPS Take 1 tablet daily by mouth. Prostate Plus otc supplement   Yes [provider]  Multiple Vitamin (MULTIVITAMIN WITH MINERALS) TABS tablet Take 1 tablet daily by mouth.   Yes [provider]  Multiple Vitamins-Minerals (PRESERVISION AREDS 2+MULTI VIT PO) Take 1 tablet by mouth 2 (two) times daily.   Yes [provider]  OVER THE COUNTER MEDICATION Take 1 tablet 2 (two) times daily by mouth. Super beta prostate otc supplement   Yes [provider]  Probiotic Product (PROBIOTIC DAILY PO) Take 1 tablet by mouth daily.   Yes [provider]  ranitidine (ZANTAC) 150 MG tablet Take 150 mg daily as needed by mouth for heartburn.   Yes [provider]  timolol (TIMOPTIC) 0.5 % ophthalmic solution Place 1 drop into both eyes at bedtime.  12/04/15  Yes [provider]  vitamin C (ASCORBIC ACID) 500 MG tablet Take 500 mg by mouth daily.   Yes [provider]  XARELTO 20 MG TABS tablet Take 20 mg daily with supper by mouth.  11/12/16  Yes [provider]   Allergies  Allergen Reactions  . Doxycycline     Upset stomach  . Keflex [Cephalexin]     Abdominal disruption   Family History  Problem Relation Age of Onset  . Hypertension Mother   . Multiple sclerosis Sister    Social History   Socioeconomic History  . Marital status: Married    Spouse name: Chad Russell  . Number of children: 0  . Years of education: 33  . Highest education level: None  Social Needs  . Financial resource strain: None  . Food insecurity - worry: None  . Food insecurity - inability:  None  . Transportation needs - medical: None  . Transportation needs - non-medical: None  Occupational History  . Occupation: Scientist, product/process development  . Occupation: Civil engineer, contracting  Tobacco Use  . Smoking status: Former Smoker    Packs/day: 1.00    Years: 3.00    Pack years: 3.00    Types: Cigarettes    Last attempt to quit: 03/17/1955    Years since quitting: 62.0  . Smokeless tobacco: Former Network engineer and Sexual Activity  . Alcohol use: Yes    Alcohol/week: 8.4 oz    Types: 7 Cans of beer, 7 Standard drinks or equivalent per week    Comment: daily  . Drug use: No  . Sexual activity: No    Birth control/protection: None  Other  Topics Concern  . 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  Has no children.     Depression screen [facility] 2/9 03/10/2017 01/24/2017 01/11/2017 01/03/2017 12/30/2016  Decreased Interest 0 0 0 0 0  Down, Depressed, Hopeless 0 0 0 0 0  PHQ - 2 Score 0 0 0 0 0    Review of Systems  Constitutional: Positive for fatigue. Negative for unexpected weight change.  Eyes: Negative for visual disturbance.  Respiratory: Positive for cough and wheezing. Negative for chest tightness and shortness of breath.   Cardiovascular: Negative for chest pain, palpitations and leg swelling.  Gastrointestinal: Negative for abdominal pain and Russell in stool.  Neurological: Negative for dizziness, light-headedness and headaches.  Psychiatric/Behavioral: Negative for sleep disturbance.       Objective:   Physical Exam  Constitutional: He is oriented to person, place, and time. He appears well-developed and well-nourished. No distress.  HENT:  Head: Normocephalic and atraumatic.  Eyes: EOM are normal. Pupils are equal, round, and reactive to light.  Neck: Neck supple.  Cardiovascular: Normal rate.    Pulmonary/Chest: Effort normal. No respiratory distress. He has wheezes (faint, expiratory).  Coarse lung sounds, worse in left lung; faint expiratory wheezing, worse in left base and upper  Musculoskeletal: Normal range of motion.  Neurological: He is alert and oriented to person, place, and time.  Skin: Skin is warm and dry.  Psychiatric: He has a normal mood and affect. His behavior is normal.  Nursing note and vitals reviewed.   BP 131/66   Pulse (!) 39   Resp 16   Ht 5' 11"  (1.803 m)   Wt 171 lb (77.6 kg)   SpO2 94%   BMI 23.85 kg/m   Results for orders placed or performed in visit on 03/12/17  POCT CBC  Result Value Ref Range   WBC 10.3 (A) 4.6 - 10.2 Russell/uL   Lymph, poc 0.8 0.6 - 3.4   POC LYMPH PERCENT 10.3 10 - 50 %L   MID (cbc) 0.8 0 - 0.9   POC MID % 8.1 0 - 12 %M   POC Granulocyte 8.7 (A) 2 - 6.9   Granulocyte percent 84.4 (A) 37 - 80 %G   RBC 3.97 (A) 4.69 - 6.13 M/uL   Hemoglobin 12.0 (A) 14.1 - 18.1 g/dL   HCT, POC 36.4 (A) 43.5 - 53.7 %   MCV 91.5 80 - 97 fL   MCH, POC 30.3 27 - 31.2 pg   MCHC 33.1 31.8 - 35.4 g/dL   RDW, POC 17.1 %   Platelet Count, POC 290 142 - 424 Russell/uL   MPV 6.5 0 - 99.8 fL       Assessment & Plan:   1. Community acquired pneumonia, unspecified laterality   Sx and cbc all improving. Offered trial of bronchodilator in office to see if we could improve his slight wheeze but pt declined as he did not feel labored or tight.  Requested med to help suppress cough when falling asleep and has done well on mucinex prior so will start.  RTC in ~5d for recheck w/ myself or Carlota Raspberry or sooner if at all worsening. Cont 10d course of omnicef (on day 2).  Orders Placed This Encounter  Procedures  . POCT CBC  Meds ordered this encounter  Medications  . Guaifenesin (MUCINEX MAXIMUM STRENGTH) 1200 MG TB12    Sig: Take 1 tablet (1,200 mg total) by mouth every 12 (twelve) hours as needed.    Dispense:  14 tablet    Refill:  1  . benzonatate  (TESSALON) 200 MG capsule    Sig: Take 1 capsule (200 mg total) by mouth 3 (three) times daily as needed for cough.    Dispense:  40 capsule    Refill:  0    I personally performed the services described in this documentation, which was scribed in my presence. The recorded information has been reviewed and considered, and addended by me as needed.   Chad Russell, M.D.  Primary Care at [facility] [address] [place], [place] [address] [phone] phone [phone] fax  03/12/17 2:43 PM

## 2017-03-12 NOTE — Patient Instructions (Addendum)
   IF you received an x-ray today, you will receive an invoice from [facility]. Please contact [facility] at [phone] with questions or concerns regarding your invoice.   IF you received labwork today, you will receive an invoice from [facility]. Please contact [facility] at [phone] with questions or concerns regarding your invoice.   Our billing staff will not be able to assist you with questions regarding bills from these companies.  You will be contacted with the lab results as soon as they are available. The fastest way to get your results is to activate your My Chart account. Instructions are located on the last page of this paperwork. If you have not heard from us regarding the results in 2 weeks, please contact this office.     Community-Acquired Pneumonia, Adult Pneumonia is an infection of the lungs. There are different types of pneumonia. One type can develop while a person is in a hospital. A different type, called community-acquired pneumonia, develops in people who are not, or have not recently been, in the hospital or other health care facility.-  What are the causes? Pneumonia may be caused by bacteria, viruses, or funguses. Community-acquired pneumonia is often caused by Streptococcus pneumonia bacteria. These bacteria are often passed from one person to another by breathing in droplets from the cough or sneeze of an infected person. What increases the risk? The condition is more likely to develop in:  People who havechronic diseases, such as chronic obstructive pulmonary disease (COPD), asthma, congestive heart failure, cystic fibrosis, diabetes, or kidney disease.  People who haveearly-stage or late-stage HIV.  People who havesickle cell disease.  People who havehad their spleen removed (splenectomy).  People who havepoor dental hygiene.  People who havemedical conditions that increase the risk of breathing in (aspirating) secretions  their own mouth and nose.  People who havea weakened immune system (immunocompromised).  People who smoke.  People whotravel to areas where pneumonia-causing germs commonly exist.  People whoare around animal habitats or animals that have pneumonia-causing germs, including birds, bats, rabbits, cats, and farm animals.  What are the signs or symptoms? Symptoms of this condition include:  Adry cough.  A wet (productive) cough.  Fever.  Sweating.  Chest pain, especially when breathing deeply or coughing.  Rapid breathing or difficulty breathing.  Shortness of breath.  Shaking chills.  Fatigue.  Muscle aches.  How is this diagnosed? Your health care provider will take a medical history and perform a physical exam. You may also have other tests, including:  Imaging studies of your chest, including X-rays.  Tests to check your blood oxygen level and other blood gases.  Other tests on blood, mucus (sputum), fluid around your lungs (pleural fluid), and urine.  If your pneumonia is severe, other tests may be done to identify the specific cause of your illness. How is this treated? The type of treatment that you receive depends on many factors, such as the cause of your pneumonia, the medicines you take, and other medical conditions that you have. For most adults, treatment and recovery from pneumonia may occur at home. In some cases, treatment must happen in a hospital. Treatment may include:  Antibiotic medicines, if the pneumonia was caused by bacteria.  Antiviral medicines, if the pneumonia was caused by a virus.  Medicines that are given by mouth or through an IV tube.  Oxygen.  Respiratory therapy.  Although rare, treating severe pneumonia may include:  Mechanical ventilation. This is done if you are not   breathing well on your own and you cannot maintain a safe blood oxygen level.  Thoracentesis. This procedureremoves fluid around one lung or both lungs to  help you breathe better.  Follow these instructions at home:  Take over-the-counter and prescription medicines only as told by your health care provider. ? Only takecough medicine if you are losing sleep. Understand that cough medicine can prevent your body's natural ability to remove mucus from your lungs. ? If you were prescribed an antibiotic medicine, take it as told by your health care provider. Do not stop taking the antibiotic even if you start to feel better.  Sleep in a semi-upright position at night. Try sleeping in a reclining chair, or place a few pillows under your head.  Do not use tobacco products, including cigarettes, chewing tobacco, and e-cigarettes. If you need help quitting, ask your health care provider.  Drink enough water to keep your urine clear or pale yellow. This will help to thin out mucus secretions in your lungs. How is this prevented? There are ways that you can decrease your risk of developing community-acquired pneumonia. Consider getting a pneumococcal vaccine if:  You are older than 82 years of age.  You are older than 82 years of age and are undergoing cancer treatment, have chronic lung disease, or have other medical conditions that affect your immune system. Ask your health care provider if this applies to you.  There are different types and schedules of pneumococcal vaccines. Ask your health care provider which vaccination option is best for you. You may also prevent community-acquired pneumonia if you take these actions:  Get an influenza vaccine every year. Ask your health care provider which type of influenza vaccine is best for you.  Go to the dentist on a regular basis.  Wash your hands often. Use hand sanitizer if soap and water are not available.  Contact a health care provider if:  You have a fever.  You are losing sleep because you cannot control your cough with cough medicine. Get help right away if:  You have worsening shortness  of breath.  You have increased chest pain.  Your sickness becomes worse, especially if you are an older adult or have a weakened immune system.  You cough up blood. This information is not intended to replace advice given to you by your health care provider. Make sure you discuss any questions you have with your health care provider. Document Released: 02/08/2005 Document Revised: 06/19/2015 Document Reviewed: 06/05/2014 Elsevier Interactive Patient Education  2018 Elsevier Inc.  

## 2017-03-15 ENCOUNTER — Inpatient Hospital Stay (HOSPITAL_COMMUNITY): Admission: RE | Admit: 2017-03-15 | Payer: Self-pay | Source: Ambulatory Visit

## 2017-03-16 DIAGNOSIS — C44519 Basal cell carcinoma of skin of other part of trunk: Secondary | ICD-10-CM | POA: Diagnosis not present

## 2017-03-16 DIAGNOSIS — Z85828 Personal history of other malignant neoplasm of skin: Secondary | ICD-10-CM | POA: Diagnosis not present

## 2017-03-17 ENCOUNTER — Ambulatory Visit: Payer: PPO | Admitting: Family Medicine

## 2017-03-17 ENCOUNTER — Encounter: Payer: Self-pay | Admitting: Family Medicine

## 2017-03-17 ENCOUNTER — Other Ambulatory Visit: Payer: Self-pay

## 2017-03-17 VITALS — BP 128/62 | HR 38 | Temp 97.3°F | Resp 16 | Ht 71.0 in | Wt 171.0 lb

## 2017-03-17 DIAGNOSIS — J189 Pneumonia, unspecified organism: Secondary | ICD-10-CM | POA: Diagnosis not present

## 2017-03-17 DIAGNOSIS — J9801 Acute bronchospasm: Secondary | ICD-10-CM

## 2017-03-17 DIAGNOSIS — D649 Anemia, unspecified: Secondary | ICD-10-CM | POA: Diagnosis not present

## 2017-03-17 LAB — POCT CBC
GRANULOCYTE PERCENT: 73.5 % (ref 37–80)
HCT, POC: 36.1 % — AB (ref 43.5–53.7)
Hemoglobin: 11.4 g/dL — AB (ref 14.1–18.1)
Lymph, poc: 1.6 (ref 0.6–3.4)
MCH: 29.9 pg (ref 27–31.2)
MCHC: 31.7 g/dL — AB (ref 31.8–35.4)
MCV: 94.3 fL (ref 80–97)
MID (CBC): 0.5 (ref 0–0.9)
MPV: 6.4 fL (ref 0–99.8)
POC Granulocyte: 5.8 (ref 2–6.9)
POC LYMPH %: 19.8 % (ref 10–50)
POC MID %: 6.7 % (ref 0–12)
Platelet Count, POC: 423 10*3/uL (ref 142–424)
RBC: 3.82 M/uL — AB (ref 4.69–6.13)
RDW, POC: 16.6 %
WBC: 7.9 10*3/uL (ref 4.6–10.2)

## 2017-03-17 MED ORDER — ALBUTEROL SULFATE HFA 108 (90 BASE) MCG/ACT IN AERS
1.0000 | INHALATION_SPRAY | RESPIRATORY_TRACT | 0 refills | Status: DC | PRN
Start: 2017-03-17 — End: 2017-04-22

## 2017-03-17 NOTE — Patient Instructions (Addendum)
I expect the cough and your energy to continue to improve. Continue antibiotic until that has been completed. Ok to continue other cough medicines prescribed by Dr. Brigitte Pulse, but if you do have tight cough or wheeze - can try the albuterol inhaler that was prescribed. If that is needed more than 2-3 times per day, or any other worsening symptoms, return for recheck. Otherwise follow-up in the next 2 weeks.    Return to the clinic or go to the nearest emergency room if any of your symptoms worsen or new symptoms occur.    How to Use a Metered Dose Inhaler A metered dose inhaler is a handheld device for taking medicine that must be breathed into the lungs (inhaled). The device can be used to deliver a variety of inhaled medicines, including:  Quick relief or rescue medicines, such as bronchodilators.  Controller medicines, such as corticosteroids.  The medicine is delivered by pushing down on a metal canister to release a preset amount of spray and medicine. Each device contains the amount of medicine that is needed for a preset number of uses (inhalations). Your health care provider may recommend that you use a spacer with your inhaler to help you take the medicine more effectively. A spacer is a plastic tube with a mouthpiece on one end and an opening that connects to the inhaler on the other end. A spacer holds the medicine in a tube for a short time, which allows you to inhale more medicine. What are the risks? If you do not use your inhaler correctly, medicine might not reach your lungs to help you breathe. Inhaler medicine can cause side effects, such as:  Mouth or throat infection.  Cough.  Hoarseness.  Headache.  Nausea and vomiting.  Lung infection (pneumonia) in people who have a lung condition called COPD.  How to use a metered dose inhaler without a spacer 1. Remove the cap from the inhaler. 2. If you are using the inhaler for the first time, shake it for 5 seconds, turn it  away from your face, then release 4 puffs into the air. This is called priming. 3. Shake the inhaler for 5 seconds. 4. Position the inhaler so the top of the canister faces up. 5. Put your index finger on the top of the medicine canister. Support the bottom of the inhaler with your thumb. 6. Breathe out normally and as completely as possible, away from the inhaler. 7. Either place the inhaler between your teeth and close your lips tightly around the mouthpiece, or hold the inhaler 1-2 inches (2.5-5 cm) away from your open mouth. Keep your tongue down out of the way. If you are unsure which technique to use, ask your health care provider. 8. Press the canister down with your index finger to release the medicine, then inhale deeply and slowly through your mouth (not your nose) until your lungs are completely filled. Inhaling should take 4-6 seconds. 9. Hold the medicine in your lungs for 5-10 seconds (10 seconds is best). This helps the medicine get into the small airways of your lungs. 10. With your lips in a tight circle (pursed), breathe out slowly. 11. Repeat steps 3-10 until you have taken the number of puffs that your health care provider directed. Wait about 1 minute between puffs or as directed. 12. Put the cap on the inhaler. 13. If you are using a steroid inhaler, rinse your mouth with water, gargle, and spit out the water. Do not swallow the water. How  to use a metered dose inhaler with a spacer 1. Remove the cap from the inhaler. 2. If you are using the inhaler for the first time, shake it for 5 seconds, turn it away from your face, then release 4 puffs into the air. This is called priming. 3. Shake the inhaler for 5 seconds. 4. Place the open end of the spacer onto the inhaler mouthpiece. 5. Position the inhaler so the top of the canister faces up and the spacer mouthpiece faces you. 6. Put your index finger on the top of the medicine canister. Support the bottom of the inhaler and the  spacer with your thumb. 7. Breathe out normally and as completely as possible, away from the spacer. 8. Place the spacer between your teeth and close your lips tightly around it. Keep your tongue down out of the way. 9. Press the canister down with your index finger to release the medicine, then inhale deeply and slowly through your mouth (not your nose) until your lungs are completely filled. Inhaling should take 4-6 seconds. 10. Hold the medicine in your lungs for 5-10 seconds (10 seconds is best). This helps the medicine get into the small airways of your lungs. 11. With your lips in a tight circle (pursed), breathe out slowly. 12. Repeat steps 3-11 until you have taken the number of puffs that your health care provider directed. Wait about 1 minute between puffs or as directed. 13. Remove the spacer from the inhaler and put the cap on the inhaler. 14. If you are using a steroid inhaler, rinse your mouth with water, gargle, and spit out the water. Do not swallow the water. Follow these instructions at home:  Take your inhaled medicine only as told by your health care provider. Do not use the inhaler more than directed by your health care provider.  Keep all follow-up visits as told by your health care provider. This is important.  If your inhaler has a counter, you can check it to determine how full your inhaler is. If your inhaler does not have a counter, ask your health care provider when you will need to refill your inhaler and write the refill date on a calendar or on your inhaler canister. Note that you cannot know when an inhaler is empty by shaking it.  Follow directions on the package insert for care and cleaning of your inhaler and spacer. Contact a health care provider if:  Symptoms are only partially relieved with your inhaler.  You are having trouble using your inhaler.  You have an increase in phlegm.  You have headaches. Get help right away if:  You feel little or no  relief after using your inhaler.  You have dizziness.  You have a fast heart rate.  You have chills or a fever.  You have night sweats.  There is blood in your phlegm. Summary  A metered dose inhaler is a handheld device for taking medicine that must be breathed into the lungs (inhaled).  The medicine is delivered by pushing down on a metal canister to release a preset amount of spray and medicine.  Each device contains the amount of medicine that is needed for a preset number of uses (inhalations). This information is not intended to replace advice given to you by your health care provider. Make sure you discuss any questions you have with your health care provider. Document Released: 02/08/2005 Document Revised: 12/30/2015 Document Reviewed: 12/30/2015 Elsevier Interactive Patient Education  2017 [employer].  IF you received an x-ray today, you will receive an invoice from [facility]. Please contact [facility] at [phone] with questions or concerns regarding your invoice.   IF you received labwork today, you will receive an invoice from [facility]. Please contact [facility] at [phone] with questions or concerns regarding your invoice.   Our billing staff will not be able to assist you with questions regarding bills from these companies.  You will be contacted with the lab results as soon as they are available. The fastest way to get your results is to activate your My Chart account. Instructions are located on the last page of this paperwork. If you have not heard from us regarding the results in 2 weeks, please contact this office.      

## 2017-03-17 NOTE — Progress Notes (Signed)
Subjective:  By signing my name below, I, [name], attest that this documentation has been prepared under the direction and in the presence of [name], MD Electronically Signed: Ladene Artist, ED Scribe 03/17/2017 at 1:41 PM.   Patient ID: Chad Russell, male    DOB: [birth year], 84 y.o.   MRN: [id number]  Chief Complaint  Patient presents with  . Cough    re check/ 5 day   HPI Chad Russell is a 84 y.o. male who presents to Primary Care at [facility] for f/u of pneumonia. Initially seen 1/17 with cough, fatigue, episodic wheeze. Positive sick contacts at that time. Afebrile but slight WBC of 13.4. No focal infiltrate but few coarse breath sounds L base. H/o complete heart block. Started on Omnicef 300 mg. Seen in f/u 2 days later by Dr. Brigitte Pulse, improved at that time. Increased hydration. Tolerating antibiotics. Faint wheeze with L sided coarse breath sounds. WBC improved to 10.3. Continued on Omnicef, added Mucinex and Tessalon. Here for recheck.  Today, pt feels that he has improved since his visit with Dr. Brigitte Pulse. Reports that he is still having coughing spells and intermittent wheezing which he last noticed last night. He denies fever, diarrhea, nausea, vomiting, any other side-effects from medications. Pt states that he has been taking Sinemet IR since having URI symptoms and he wonders if this is the cause.  Patient Active Problem List   Diagnosis Date Noted  . MGUS (monoclonal gammopathy of unknown significance) 01/15/2017  . Chest congestion 11/30/2016  . Cough 11/30/2016  . Acute bronchitis 11/30/2016  . BPH with obstruction/lower urinary tract symptoms 09/07/2016  . Preoperative cardiovascular examination 08/24/2016  . Dysuria 05/13/2016  . Urinary retention 05/13/2016  . Nausea without vomiting 05/13/2016  . Pelvic pain 05/13/2016  . Balance problem 10/31/2013  . OSA (obstructive sleep apnea) 10/31/2013  . BPH (benign prostatic hyperplasia) 08/28/2012  .  Atrial fibrillation ([facility]) 06/08/2012  . Bacteremia 04/28/2012  . Hyperplasia of prostate with lower urinary tract symptoms (LUTS) 04/28/2012  . Calf pain 04/28/2012  . Moderate malnutrition ([facility]) 04/28/2012  . Influenza-like illness 04/27/2012  . Infection of urinary tract - recurrent 04/26/2012  . NSTEMI (non-ST elevated myocardial infarction) ([place]) 03/15/2011  . Complete heart block ([place]) 03/15/2011  . HTN (hypertension) 03/15/2011   Past Medical History:  Diagnosis Date  . Arthritis   . Atrial fibrillation ([place])   . Balance problem 10/31/2013  . BPH (benign prostatic hyperplasia) 2014   Urology Dr [name] with [facility] [phone]   . Bradycardia    a. baseline HR in the 30's. Asymptomatic - no history of PPM placement.   Marland Kitchen CAD (coronary artery disease)    LHC 03/16/11: Mid LAD 10-20%, proximal circumflex 10%, mid RCA 20%, EF 55%.  . Cataract   . Complete heart block ([place])   . GERD (gastroesophageal reflux disease)    takes occasional  zantac  . Hx of echocardiogram    Echo 02/2011: EF 65-70%, normal wall motion, MAC, mild MR, mild LAE, mild RVE  . Hypertension   . OSA (obstructive sleep apnea)    per patient went to have sleep study done about 2 years ago, bu they never F/U with him about results; but wife reports he still has periods of apnea at night   . Talar fracture    casted no surg   Past Surgical History:  Procedure Laterality Date  . ABDOMINAL AORTOGRAM W/LOWER EXTREMITY N/A 01/17/2017   Procedure: ABDOMINAL AORTOGRAM  W/LOWER EXTREMITY;  Surgeon: Angelia Mould, MD;  Location: [facility];  Service: Cardiovascular;  Laterality: N/A;  . COLONOSCOPY     Repeated and normal in 2011  . hemmorhoidectomy    . INGUINAL HERNIA REPAIR Right 02/26/2014   Procedure: LAPAROSCOPIC RIGHT INGUINAL HERNIA REPAIR;  Surgeon: Ralene Ok, MD;  Location: [facility];  Service: General;  Laterality: Right;  . INSERTION OF MESH Right 02/26/2014   Procedure: INSERTION OF MESH;   Surgeon: Ralene Ok, MD;  Location: [facility];  Service: General;  Laterality: Right;  . LEFT HEART CATHETERIZATION WITH CORONARY ANGIOGRAM N/A 03/16/2011   Procedure: LEFT HEART CATHETERIZATION WITH CORONARY ANGIOGRAM;  Surgeon: Peter M Martinique, MD;  Location: [facility];  Service: Cardiovascular;  Laterality: N/A;  . PROSTATE BIOPSY     negative - [facility]  . TRANSURETHRAL RESECTION OF PROSTATE N/A 09/07/2016   Procedure: TRANSURETHRAL RESECTION OF THE PROSTATE (TURP);  Surgeon: Irine Seal, MD;  Location: [facility];  Service: Urology;  Laterality: N/A;   Allergies  Allergen Reactions  . Doxycycline     Upset stomach  . Keflex [Cephalexin]     Abdominal disruption   Prior to Admission medications   Medication Sig Start Date End Date Taking? Authorizing Provider  benzonatate (TESSALON) 200 MG capsule Take 1 capsule (200 mg total) by mouth 3 (three) times daily as needed for cough. 03/12/17   Shawnee Knapp, MD  carbidopa-levodopa (SINEMET IR) 25-100 MG tablet Take 1 tablet by mouth 3 (three) times daily. 02/25/17   Narda Amber K, DO  cefdinir (OMNICEF) 300 MG capsule Take 1 capsule (300 mg total) by mouth 2 (two) times daily. 03/10/17   [name], MD  docusate sodium (COLACE) 100 MG capsule Take 100 mg at bedtime as needed by mouth for mild constipation.    [provider]  Guaifenesin (MUCINEX MAXIMUM STRENGTH) 1200 MG TB12 Take 1 tablet (1,200 mg total) by mouth every 12 (twelve) hours as needed. 03/12/17   Shawnee Knapp, MD  hydrocortisone cream 1 % Apply 1 application daily as needed topically for itching.    [provider]  lisinopril-hydrochlorothiazide (PRINZIDE,ZESTORETIC) 10-12.5 MG tablet Take 1 tablet by mouth daily. 12/20/16   Minus Breeding, MD  LUTEIN-ZEAXANTHIN PO Take 1 tablet daily by mouth.     [provider]  MAGNESIUM CITRATE PO Take 250 mg daily by mouth.    [provider]  Misc Natural Products (PROSTATE) CAPS Take 1 tablet  daily by mouth. Prostate Plus otc supplement    [provider]  Multiple Vitamin (MULTIVITAMIN WITH MINERALS) TABS tablet Take 1 tablet daily by mouth.    [provider]  Multiple Vitamins-Minerals (PRESERVISION AREDS 2+MULTI VIT PO) Take 1 tablet by mouth 2 (two) times daily.    [provider]  OVER THE COUNTER MEDICATION Take 1 tablet 2 (two) times daily by mouth. Super beta prostate otc supplement    [provider]  Probiotic Product (PROBIOTIC DAILY PO) Take 1 tablet by mouth daily.    [provider]  ranitidine (ZANTAC) 150 MG tablet Take 150 mg daily as needed by mouth for heartburn.    [provider]  timolol (TIMOPTIC) 0.5 % ophthalmic solution Place 1 drop into both eyes at bedtime.  12/04/15   [provider]  vitamin C (ASCORBIC ACID) 500 MG tablet Take 500 mg by mouth daily.    [provider]  XARELTO 20 MG TABS tablet Take 20  mg daily with supper by mouth.  11/12/16   [provider]   Social History   Socioeconomic History  . Marital status: Married    Spouse name: Mardene Celeste  . Number of children: 0  . Years of education: 78  . Highest education level: Not on file  Social Needs  . Financial resource strain: Not on file  . Food insecurity - worry: Not on file  . Food insecurity - inability: Not on file  . Transportation needs - medical: Not on file  . Transportation needs - non-medical: Not on file  Occupational History  . Occupation: Scientist, product/process development  . Occupation: Civil engineer, contracting  Tobacco Use  . Smoking status: Former Smoker    Packs/day: 1.00    Years: 3.00    Pack years: 3.00    Types: Cigarettes    Last attempt to quit: 03/17/1955    Years since quitting: 62.0  . Smokeless tobacco: Former Network engineer and Sexual Activity  . Alcohol use: Yes    Alcohol/week: 8.4 oz    Types: 7 Cans of beer, 7 Standard drinks or equivalent per week    Comment: daily  . Drug use: No  . Sexual  activity: No    Birth control/protection: None  Other Topics Concern  . 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  Has no children.     Review of Systems  Constitutional: Negative for fever.  Respiratory: Positive for cough (improved) and wheezing (improved).   Gastrointestinal: Negative for diarrhea, nausea and vomiting.      Objective:   Physical Exam  Constitutional: He is oriented to person, place, and time. He appears well-developed and well-nourished.  HENT:  Head: Normocephalic and atraumatic.  Right Ear: Tympanic membrane, external ear and ear canal normal.  Left Ear: Tympanic membrane, external ear and ear canal normal.  Nose: No rhinorrhea.  Mouth/Throat: Oropharynx is clear and moist and mucous membranes are normal. No oropharyngeal exudate or posterior oropharyngeal erythema.  Eyes: Conjunctivae are normal. Pupils are equal, round, and reactive to light.  Neck: Neck supple.  Cardiovascular: Normal rate, regular rhythm, normal heart sounds and intact distal pulses.  No murmur heard. Pulmonary/Chest: Effort normal. He has wheezes. He has no rhonchi. He has no rales.  Faint expiratory wheeze L-side. Few distant coarse breath sounds L lower. Otherwise clear.  Abdominal: Soft. There is no tenderness.  Lymphadenopathy:    He has no cervical adenopathy.  Neurological: He is alert and oriented to person, place, and time.  Skin: Skin is warm and dry. No rash noted.  Psychiatric: He has a normal mood and affect. His behavior is normal.  Vitals reviewed.  Vitals:   03/17/17 1329  BP: 128/62  Pulse: (!) 38  Resp: 16  Temp: (!) 97.3 F (36.3 C)  TempSrc: Oral  SpO2: 99%  Weight: 171 lb (77.6 kg)  Height: _0  (1.803 m)   Results for orders placed or performed in visit on 03/17/17    POCT CBC  Result Value Ref Range   WBC 7.9 4.6 - 10.2 K/uL   Lymph, poc 1.6 0.6 - 3.4   POC LYMPH PERCENT 19.8 10 - 50 %L   MID (cbc) 0.5 0 - 0.9   POC MID %  6.7 0 - 12 %M   POC Granulocyte 5.8 2 - 6.9   Granulocyte percent 73.5 37 - 80 %G   RBC 3.82 (A) 4.69 - 6.13 M/uL   Hemoglobin 11.4 (A) 14.1 - 18.1 g/dL   HCT, POC 36.1 (A) 43.5 - 53.7 %   MCV 94.3 80 - 97 fL   MCH, POC 29.9 27 - 31.2 pg   MCHC 31.7 (A) 31.8 - 35.4 g/dL   RDW, POC 16.6 %   Platelet Count, POC 423 142 - 424 K/uL   MPV 6.4 0 - 99.8 fL      Assessment & Plan:    LARRON ARMOR is a 84 y.o. male Anemia, unspecified type - Plan: POCT CBC  -Overall appears to be stable from previous readings, slightly lower today than last visit. Plan to recheck in 2 weeks, RTC precautions if increase in fatigue, stool changes, or other new symptoms  Pneumonia due to infectious organism, unspecified laterality, unspecified part of lung - Plan: POCT CBC Bronchospasm - Plan: albuterol (PROVENTIL HFA;VENTOLIN HFA) 108 (90 Base) MCG/ACT inhaler  -Suspected left-sided pneumonia based on initial exam, although initial imaging did not indicate infiltrate. Few faint wheezes noted on exam today as well as last visit, initially he declined any inhalers. Did prescribe albuterol today in case he has any persistent coughing fits that were initially described or with wheeze can try 1 puff every 4 hours as needed.   Continue Mucinex, Tessalon Perles as needed.  -Recheck 2 weeks, RTC precautions if worsening symptoms prior.  Meds ordered this encounter  Medications  . albuterol (PROVENTIL HFA;VENTOLIN HFA) 108 (90 Base) MCG/ACT inhaler    Sig: Inhale 1 puff into the lungs every 4 (four) hours as needed for wheezing or shortness of breath.    Dispense:  1 Inhaler    Refill:  0   Patient Instructions    I expect the cough and your energy to continue to improve. Continue antibiotic until that has been completed. Ok to continue other cough  medicines prescribed by Dr. Brigitte Pulse, but if you do have tight cough or wheeze - can try the albuterol inhaler that was prescribed. If that is needed more than 2-3 times per day, or any other worsening symptoms, return for recheck. Otherwise follow-up in the next 2 weeks.    Return to the clinic or go to the nearest emergency room if any of your symptoms worsen or new symptoms occur.    How to Use a Metered Dose Inhaler A metered dose inhaler is a handheld device for taking medicine that must be breathed into the lungs (inhaled). The device can be used to deliver a variety of inhaled medicines, including:  Quick relief or rescue medicines, such as bronchodilators.  Controller medicines, such as corticosteroids.  The medicine is delivered by pushing down on a metal canister to release a preset amount of spray and medicine. Each device contains the amount of medicine that is needed for a preset number of uses (inhalations). Your health care provider may recommend that you use a spacer with your inhaler to help you take the medicine more effectively. A spacer is a plastic tube with a mouthpiece on one end and an opening that connects to the inhaler on the other end. A spacer holds the medicine in a tube for a short time, which allows you to inhale more medicine. What are the risks? If you do not use your inhaler correctly, medicine might not reach your lungs to  help you breathe. Inhaler medicine can cause side effects, such as:  Mouth or throat infection.  Cough.  Hoarseness.  Headache.  Nausea and vomiting.  Lung infection (pneumonia) in people who have a lung condition called COPD.  How to use a metered dose inhaler without a spacer 1. Remove the cap from the inhaler. 2. If you are using the inhaler for the first time, shake it for 5 seconds, turn it away from your face, then release 4 puffs into the air. This is called priming. 3. Shake the inhaler for 5 seconds. 4. Position the  inhaler so the top of the canister faces up. 5. Put your index finger on the top of the medicine canister. Support the bottom of the inhaler with your thumb. 6. Breathe out normally and as completely as possible, away from the inhaler. 7. Either place the inhaler between your teeth and close your lips tightly around the mouthpiece, or hold the inhaler 1-2 inches (2.5-5 cm) away from your open mouth. Keep your tongue down out of the way. If you are unsure which technique to use, ask your health care provider. 8. Press the canister down with your index finger to release the medicine, then inhale deeply and slowly through your mouth (not your nose) until your lungs are completely filled. Inhaling should take 4-6 seconds. 9. Hold the medicine in your lungs for 5-10 seconds (10 seconds is best). This helps the medicine get into the small airways of your lungs. 10. With your lips in a tight circle (pursed), breathe out slowly. 11. Repeat steps 3-10 until you have taken the number of puffs that your health care provider directed. Wait about 1 minute between puffs or as directed. 12. Put the cap on the inhaler. 13. If you are using a steroid inhaler, rinse your mouth with water, gargle, and spit out the water. Do not swallow the water. How to use a metered dose inhaler with a spacer 1. Remove the cap from the inhaler. 2. If you are using the inhaler for the first time, shake it for 5 seconds, turn it away from your face, then release 4 puffs into the air. This is called priming. 3. Shake the inhaler for 5 seconds. 4. Place the open end of the spacer onto the inhaler mouthpiece. 5. Position the inhaler so the top of the canister faces up and the spacer mouthpiece faces you. 6. Put your index finger on the top of the medicine canister. Support the bottom of the inhaler and the spacer with your thumb. 7. Breathe out normally and as completely as possible, away from the spacer. 8. Place the spacer between your  teeth and close your lips tightly around it. Keep your tongue down out of the way. 9. Press the canister down with your index finger to release the medicine, then inhale deeply and slowly through your mouth (not your nose) until your lungs are completely filled. Inhaling should take 4-6 seconds. 10. Hold the medicine in your lungs for 5-10 seconds (10 seconds is best). This helps the medicine get into the small airways of your lungs. 11. With your lips in a tight circle (pursed), breathe out slowly. 12. Repeat steps 3-11 until you have taken the number of puffs that your health care provider directed. Wait about 1 minute between puffs or as directed. 13. Remove the spacer from the inhaler and put the cap on the inhaler. 14. If you are using a steroid inhaler, rinse your mouth with water, gargle,  and spit out the water. Do not swallow the water. Follow these instructions at home:  Take your inhaled medicine only as told by your health care provider. Do not use the inhaler more than directed by your health care provider.  Keep all follow-up visits as told by your health care provider. This is important.  If your inhaler has a counter, you can check it to determine how full your inhaler is. If your inhaler does not have a counter, ask your health care provider when you will need to refill your inhaler and write the refill date on a calendar or on your inhaler canister. Note that you cannot know when an inhaler is empty by shaking it.  Follow directions on the package insert for care and cleaning of your inhaler and spacer. Contact a health care provider if:  Symptoms are only partially relieved with your inhaler.  You are having trouble using your inhaler.  You have an increase in phlegm.  You have headaches. Get help right away if:  You feel little or no relief after using your inhaler.  You have dizziness.  You have a fast heart rate.  You have chills or a fever.  You have night  sweats.  There is blood in your phlegm. Summary  A metered dose inhaler is a handheld device for taking medicine that must be breathed into the lungs (inhaled).  The medicine is delivered by pushing down on a metal canister to release a preset amount of spray and medicine.  Each device contains the amount of medicine that is needed for a preset number of uses (inhalations). This information is not intended to replace advice given to you by your health care provider. Make sure you discuss any questions you have with your health care provider. Document Released: 02/08/2005 Document Revised: 12/30/2015 Document Reviewed: 12/30/2015 Elsevier Interactive Patient Education  2017 [employer].    IF you received an x-ray today, you will receive an invoice from [facility]. Please contact [facility] at [phone] with questions or concerns regarding your invoice.   IF you received labwork today, you will receive an invoice from [facility]. Please contact [facility] at [phone] with questions or concerns regarding your invoice.   Our billing staff will not be able to assist you with questions regarding bills from these companies.  You will be contacted with the lab results as soon as they are available. The fastest way to get your results is to activate your My Chart account. Instructions are located on the last page of this paperwork. If you have not heard from [place] regarding the results in 2 weeks, please contact this office.       I personally performed the services described in this documentation, which was scribed in my presence. The recorded information has been reviewed and considered for accuracy and completeness, addended by me as needed, and agree with information above.  Signed,   [name], MD [facility].  03/17/17 2:08 PM

## 2017-03-18 ENCOUNTER — Ambulatory Visit (HOSPITAL_COMMUNITY)
Admission: RE | Admit: 2017-03-18 | Discharge: 2017-03-18 | Disposition: A | Discharge status: Home / Self Care | Payer: PPO | Source: Ambulatory Visit | Attending: Hematology and Oncology | Admitting: Hematology and Oncology

## 2017-03-18 DIAGNOSIS — D472 Monoclonal gammopathy: Secondary | ICD-10-CM

## 2017-03-18 DIAGNOSIS — I420 Dilated cardiomyopathy: Secondary | ICD-10-CM | POA: Insufficient documentation

## 2017-03-18 DIAGNOSIS — I051 Rheumatic mitral insufficiency: Secondary | ICD-10-CM | POA: Insufficient documentation

## 2017-03-18 NOTE — Progress Notes (Signed)
  Echocardiogram 2D Echocardiogram has been performed.  Kiwan Gadsden L Androw 03/18/2017, 11:49 AM

## 2017-03-22 DIAGNOSIS — H353221 Exudative age-related macular degeneration, left eye, with active choroidal neovascularization: Secondary | ICD-10-CM | POA: Diagnosis not present

## 2017-03-22 DIAGNOSIS — H353211 Exudative age-related macular degeneration, right eye, with active choroidal neovascularization: Secondary | ICD-10-CM | POA: Diagnosis not present

## 2017-03-22 DIAGNOSIS — H401131 Primary open-angle glaucoma, bilateral, mild stage: Secondary | ICD-10-CM | POA: Diagnosis not present

## 2017-03-22 DIAGNOSIS — H353113 Nonexudative age-related macular degeneration, right eye, advanced atrophic without subfoveal involvement: Secondary | ICD-10-CM | POA: Diagnosis not present

## 2017-03-23 DIAGNOSIS — R351 Nocturia: Secondary | ICD-10-CM | POA: Diagnosis not present

## 2017-03-23 DIAGNOSIS — N401 Enlarged prostate with lower urinary tract symptoms: Secondary | ICD-10-CM | POA: Diagnosis not present

## 2017-03-24 ENCOUNTER — Telehealth: Payer: Self-pay

## 2017-03-25 ENCOUNTER — Telehealth: Payer: Self-pay

## 2017-03-25 NOTE — Telephone Encounter (Signed)
I called patient today regarding one of his scans that was denied.  Patient was not sure if it was his PET scan or his Bone Marrow Biopsy that was denied.  I spoke with our financial advisors and they advised patient to call customer service or the financial advisors where the scans took place.  I left the information on patients voicemail and asked him to call back with any updates on the matter.  

## 2017-03-29 NOTE — Assessment & Plan Note (Signed)
84 y.o. male with a number of comorbidities including peripheral neuropathy, peripheral vascular disease, complete heart block and atrial fibrillation referred to our clinic for discovery of possible monoclonal protein spike on protein electrophoresis and March 2018. At the present time, patient has no other symptoms pointing in the direction of multiple myeloma other than his peripheral neuropathy which may be more easily attributable to his peripheral vascular disease.    Our assessment is positive for presence of monoclonal IgG lambda protein with small amount of the paraprotein in the peripheral blood.  Additional evaluation including systemic imaging with PET/CT and a bone marrow biopsy revealed no evidence of multiple myeloma at this time.  Patient's diagnosis remains monoclonal gammopathy of unknown significance.  Will conduct additional assessment for possible presence of amyloidosis.  Plan: -Echocardiogram -Consult general surgery for fat pad biopsy for amyloidosis staining -Repeat CT scan of the chest without contrast in 3-6 months based on radiology recommendations to assess status of the pulmonary nodule in the left lobe. -Return to clinic in 3 months with lab work obtained 1 week prior for continued hematological monitoring. 

## 2017-03-29 NOTE — Progress Notes (Signed)
Chad [facility] Follow-up Visit:  Assessment: MGUS (monoclonal gammopathy of unknown significance) 84 y.o. male with a number of comorbidities including peripheral neuropathy, peripheral vascular disease, complete heart block and atrial fibrillation referred to our clinic for discovery of possible monoclonal protein spike on protein electrophoresis and March 2018. At the present time, patient has no other symptoms pointing in the direction of multiple myeloma other than his peripheral neuropathy which may be more easily attributable to his peripheral vascular disease.    Our assessment is positive for presence of monoclonal IgG lambda protein with small amount of the paraprotein in the peripheral blood.  Additional evaluation including systemic imaging with PET/CT and a bone marrow biopsy revealed no evidence of multiple myeloma at this time.  Patient's diagnosis remains monoclonal gammopathy of unknown significance.  Will conduct additional assessment for possible presence of amyloidosis.  Plan: -Echocardiogram -Consult general surgery for fat pad biopsy for amyloidosis staining -Repeat CT scan of the chest without contrast in 3-6 months based on radiology recommendations to assess status of the pulmonary nodule in the left lobe. -Return to clinic in 3 months with lab work obtained 1 week prior for continued hematological monitoring.  Voice recognition software was used and creation of this note. Despite my best effort at editing the text, some misspelling/errors may have occurred.  Orders Placed This Encounter  Procedures  . TSH    Standing Status:   Future    Standing Expiration Date:   03/10/2018  . Multiple Myeloma Panel (SPEP&IFE w/QIG)    Standing Status:   Future    Standing Expiration Date:   03/10/2018  . Kappa/lambda light chains    Standing Status:   Future    Standing Expiration Date:   03/10/2018  . CMP ([facility] only)    Standing Status:   Future   Standing Expiration Date:   03/10/2018  . CBC with Differential ([facility] Only)    Standing Status:   Future    Standing Expiration Date:   03/10/2018  . Ambulatory referral to General Surgery    Referral Priority:   Routine    Referral Type:   Surgical    Referral Reason:   Specialty Services Required    Requested Specialty:   General Surgery    Number of Visits Requested:   1  . ECHOCARDIOGRAM COMPLETE    Please eval for evidence of amyloid deposition    Standing Status:   Future    Number of Occurrences:   1    Standing Expiration Date:   06/09/2018    Order Specific Question:   Where should this test be performed    Answer:   [place]    Order Specific Question:   Perflutren DEFINITY (image enhancing agent) should be administered unless hypersensitivity or allergy exist    Answer:   Administer Perflutren    Order Specific Question:   Expected Date:    Answer:   1 week    [facility] Staging No matching staging information was found for the patient.  All questions were answered.  . The patient knows to call the clinic with any problems, questions or concerns.  This note was electronically signed.    History of Presenting Illness Chad [facility] is an 84 y.o. male followed in the [facility] for medical gammopathy of unknown significance, referred to [place] by Dr Alda Berthold.  Patient was being followed by neurology for bilateral lower extremity neuropathy with abnormal gait and  loss of balance.  The patient reports the symptoms of progressive neuropathy since 2010.  He also has peripheral vascular disease, possible venous stasis with cutaneous ulcers on both ankles.  Additional complaints include progressive muscle mass loss due to limited mobility due to balance problems.  Patient denies fevers, chills or night sweats.  No significant change in appetite although activity level is diminished as above.  Patient denies chest pain, shortness of breath, or cough.  Denies nausea,  vomiting, abdominal pain, diarrhea, or constipation.  No urinary or dermatological complaints.  Review of past medical history significant for obstructive sleep apnea, atrial fibrillation, non-ST elevation myocardial infarction, grade 3 heart block with bradycardia, hypertension, and GERD.  Oncological/hematological History: --Labs, 05/14/16: SPEP --possible, but not definitive monoclonal spike present; IgG 1053, IgA 184, IgM 41 --Labs, 11/09/16: tProt 6.5, Alb 4.1, Ca 9.4, Cr 1.1, AP 54; WBC 5.6, Hgb 11.2, Plt 264 --Labs, 01/04/17: tProt 7.4, Alb 4.0, Ca 9.8, Cr 1.2, AP 58, LDH 199; SPEP -- M-Spike 0.3g/dL, SIFE -- IgG lambda; IgG 1144, IgA 231, IgM 49; kappa 22.9(up), lambda 17.4, KLR 1.32; WBC 5.8, hgb 12.3, Plt 318; --Admission, 01/06/17: Hospital with nonhealing ulcer over the left lateral malleolus. On 01/17/17 underwent left iliac artery catheterization with occlusion identified in the anterior tibial artery.  Final diagnosis is a venous insufficiency ulcer without evidence of significant peripheral arterial vascular disease. --PET-CT, 01/18/17: No evidence of hypermetabolic skeletal lesions.  0.7 cm nodule in the left lung. --BM Bx, 02/10/17: Hypercellular bone marrow with mild polyclonal plasmacytosis accounting for 8% of overall cellularity; FlowCyto --negative for a clonal proliferation process; CytoGen --2 coli is identified with 45% of cells demonstrating normal 46, XY and 55% demonstrating 45, XY- cytogenetic profile; MM FISH --normal test without evidence of disease-defining mutations.   Medical History: Past Medical History:  Diagnosis Date  . Arthritis   . Atrial fibrillation ([place])   . Balance problem 10/31/2013  . BPH (benign prostatic hyperplasia) 2014   Urology Dr [name] with [facility] [phone]   . Bradycardia    a. baseline HR in the 30's. Asymptomatic - no history of PPM placement.   Marland Kitchen CAD (coronary artery disease)    LHC 03/16/11: Mid LAD 10-20%, proximal circumflex 10%,  mid RCA 20%, EF 55%.  . Cataract   . Complete heart block ([place])   . GERD (gastroesophageal reflux disease)    takes occasional  zantac  . Hx of echocardiogram    Echo 02/2011: EF 65-70%, normal wall motion, MAC, mild MR, mild LAE, mild RVE  . Hypertension   . OSA (obstructive sleep apnea)    per patient went to have sleep study done about 2 years ago, bu they never F/U with him about results; but wife reports he still has periods of apnea at night   . Talar fracture    casted no surg    Surgical History: Past Surgical History:  Procedure Laterality Date  . ABDOMINAL AORTOGRAM W/LOWER EXTREMITY N/A 01/17/2017   Procedure: ABDOMINAL AORTOGRAM W/LOWER EXTREMITY;  Surgeon: Angelia Mould, MD;  Location: [facility];  Service: Cardiovascular;  Laterality: N/A;  . COLONOSCOPY     Repeated and normal in 2011  . hemmorhoidectomy    . INGUINAL HERNIA REPAIR Right 02/26/2014   Procedure: LAPAROSCOPIC RIGHT INGUINAL HERNIA REPAIR;  Surgeon: Ralene Ok, MD;  Location: [facility];  Service: General;  Laterality: Right;  . INSERTION OF MESH Right 02/26/2014   Procedure: INSERTION OF MESH;  Surgeon: Anne Hahn  Rosendo Gros, MD;  Location: [facility];  Service: General;  Laterality: Right;  . LEFT HEART CATHETERIZATION WITH CORONARY ANGIOGRAM N/A 03/16/2011   Procedure: LEFT HEART CATHETERIZATION WITH CORONARY ANGIOGRAM;  Surgeon: Peter M Martinique, MD;  Location: [facility];  Service: Cardiovascular;  Laterality: N/A;  . PROSTATE BIOPSY     negative - [facility]  . TRANSURETHRAL RESECTION OF PROSTATE N/A 09/07/2016   Procedure: TRANSURETHRAL RESECTION OF THE PROSTATE (TURP);  Surgeon: Irine Seal, MD;  Location: [facility];  Service: Urology;  Laterality: N/A;    Family History: Family History  Problem Relation Age of Onset  . Hypertension Mother   . Multiple sclerosis Sister     Social History: Social History   Socioeconomic History  . Marital status: Married    Spouse name: Mardene Celeste  .  Number of children: 0  . Years of education: 80  . Highest education level: Not on file  Social Needs  . Financial resource strain: Not on file  . Food insecurity - worry: Not on file  . Food insecurity - inability: Not on file  . Transportation needs - medical: Not on file  . Transportation needs - non-medical: Not on file  Occupational History  . Occupation: Scientist, product/process development  . Occupation: Civil engineer, contracting  Tobacco Use  . Smoking status: Former Smoker    Packs/day: 1.00    Years: 3.00    Pack years: 3.00    Types: Cigarettes    Last attempt to quit: 03/17/1955    Years since quitting: 62.0  . Smokeless tobacco: Former Network engineer and Sexual Activity  . Alcohol use: Yes    Alcohol/week: 8.4 oz    Types: 7 Cans of beer, 7 Standard drinks or equivalent per week    Comment: daily  . Drug use: No  . Sexual activity: No    Birth control/protection: None  Other Topics Concern  . 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  Has no children.      Allergies: Allergies  Allergen Reactions  . Doxycycline     Upset stomach  . Keflex [Cephalexin]     Abdominal disruption    Medications:  Current Outpatient Medications  Medication Sig Dispense Refill  . carbidopa-levodopa (SINEMET IR) 25-100 MG tablet Take 1 tablet by mouth 3 (three) times daily. 90 tablet 5  . docusate sodium (COLACE) 100 MG capsule Take 100 mg at bedtime as needed by mouth for mild constipation.    . hydrocortisone cream 1 % Apply 1 application daily as needed topically for itching.    Marland Kitchen lisinopril-hydrochlorothiazide (PRINZIDE,ZESTORETIC) 10-12.5 MG tablet Take 1 tablet by mouth daily. 90 tablet 3  . LUTEIN-ZEAXANTHIN PO Take 1 tablet daily by mouth.     Marland Kitchen MAGNESIUM CITRATE PO Take 250 mg daily by mouth.    . Misc Natural  Products (PROSTATE) CAPS Take 1 tablet daily by mouth. Prostate Plus otc supplement    . Multiple Vitamin (MULTIVITAMIN WITH MINERALS) TABS tablet Take 1 tablet daily by mouth.    . Multiple Vitamins-Minerals (PRESERVISION AREDS 2+MULTI VIT PO) Take 1 tablet by mouth 2 (two) times daily.    Marland Kitchen OVER THE COUNTER MEDICATION Take 1 tablet 2 (two) times daily by mouth. Super beta prostate otc supplement    .  Probiotic Product (PROBIOTIC DAILY PO) Take 1 tablet by mouth daily.    . ranitidine (ZANTAC) 150 MG tablet Take 150 mg daily as needed by mouth for heartburn.    . timolol (TIMOPTIC) 0.5 % ophthalmic solution Place 1 drop into both eyes at bedtime.     . vitamin C (ASCORBIC ACID) 500 MG tablet Take 500 mg by mouth daily.    Alveda Reasons 20 MG TABS tablet Take 20 mg daily with supper by mouth.     Marland Kitchen albuterol (PROVENTIL HFA;VENTOLIN HFA) 108 (90 Base) MCG/ACT inhaler Inhale 1 puff into the lungs every 4 (four) hours as needed for wheezing or shortness of breath. 1 Inhaler 0  . benzonatate (TESSALON) 200 MG capsule Take 1 capsule (200 mg total) by mouth 3 (three) times daily as needed for cough. 40 capsule 0  . cefdinir (OMNICEF) 300 MG capsule Take 1 capsule (300 mg total) by mouth 2 (two) times daily. 20 capsule 0  . Guaifenesin (MUCINEX MAXIMUM STRENGTH) 1200 MG TB12 Take 1 tablet (1,200 mg total) by mouth every 12 (twelve) hours as needed. 14 tablet 1   No current facility-administered medications for this visit.     Review of Systems: Review of Systems  All other systems reviewed and are negative.    PHYSICAL EXAMINATION Blood pressure 127/63, pulse (!) 36, temperature (!) 97.5 F (36.4 C), temperature source Oral, resp. rate 15, weight 168 lb (76.2 kg), SpO2 98 %.  ECOG PERFORMANCE STATUS: 1 - Symptomatic but completely ambulatory  Physical Exam  Constitutional: He is oriented to person, place, and time. No distress.  HENT:  Head: Normocephalic and atraumatic.  Mouth/Throat:  Oropharynx is clear and moist. No oropharyngeal exudate.  Eyes: Conjunctivae and EOM are normal. Pupils are equal, round, and reactive to light. No scleral icterus.  Neck: No thyromegaly present.  Cardiovascular: Normal rate, regular rhythm and normal heart sounds.  No murmur heard. Pulmonary/Chest: Effort normal and breath sounds normal. No respiratory distress. He has no wheezes. He has no rales.  Abdominal: Soft. Bowel sounds are normal. He exhibits no distension and no mass. There is no tenderness. There is no rebound.  Musculoskeletal: He exhibits edema.  Bilateral lower extremity edema.  Dressing in place on both lower extremities limiting further evaluation.  Lymphadenopathy:    He has no cervical adenopathy.  Neurological: He is alert and oriented to person, place, and time. He displays abnormal reflex. No cranial nerve deficit.  Symmetric decrease and deep tendon reflexes in both lower extremities.  Skin: No rash noted. He is not diaphoretic. No erythema.     LABORATORY DATA: I have personally reviewed the data as listed: Office Visit on 03/10/2017  Component Date Value Ref Range Status  . WBC 03/10/2017 13.4* 4.6 - 10.2 K/uL Final  . Lymph, poc 03/10/2017 1.5  0.6 - 3.4 Final  . POC LYMPH PERCENT 03/10/2017 11.1  10 - 50 %L Final  . MID (cbc) 03/10/2017 1.0* 0 - 0.9 Final  . POC MID % 03/10/2017 7.5  0 - 12 %M Final  . POC Granulocyte 03/10/2017 10.9* 2 - 6.9 Final  . Granulocyte percent 03/10/2017 81.4* 37 - 80 %G Final  . RBC 03/10/2017 4.07* 4.69 - 6.13 M/uL Final  . Hemoglobin 03/10/2017 12.6* 14.1 - 18.1 g/dL Final  . HCT, POC 03/10/2017 38.3* 43.5 - 53.7 % Final  . MCV 03/10/2017 94.1  80 - 97 fL Final  . MCH, POC 03/10/2017 30.9  27 - 31.2 pg Final  .  MCHC 03/10/2017 32.9  31.8 - 35.4 g/dL Final  . RDW, POC 03/10/2017 16.9  % Final  . Platelet Count, POC 03/10/2017 260  142 - 424 K/uL Final  . MPV 03/10/2017 6.8  0 - 99.8 fL Final  . Glucose 03/10/2017 115* 65 -  99 mg/dL Final  . BUN 03/10/2017 31* 8 - 27 mg/dL Final  . Creatinine, Ser 03/10/2017 1.15  0.76 - 1.27 mg/dL Final  . GFR calc non Af Amer 03/10/2017 56* >59 mL/min/1.73 Final  . GFR calc Af Amer 03/10/2017 65  >59 mL/min/1.73 Final  . BUN/Creatinine Ratio 03/10/2017 27* 10 - 24 Final  . Sodium 03/10/2017 140  134 - 144 mmol/L Final  . Potassium 03/10/2017 4.6  3.5 - 5.2 mmol/L Final  . Chloride 03/10/2017 100  96 - 106 mmol/L Final  . CO2 03/10/2017 24  20 - 29 mmol/L Final  . Calcium 03/10/2017 9.1  8.6 - 10.2 mg/dL Final       Ardath Sax, MD

## 2017-04-15 ENCOUNTER — Other Ambulatory Visit: Payer: Self-pay | Admitting: Cardiology

## 2017-04-18 ENCOUNTER — Telehealth: Payer: Self-pay | Admitting: Family Medicine

## 2017-04-18 NOTE — Telephone Encounter (Signed)
Copied from [place]. Topic: Quick Communication - See Telephone Encounter >> Apr 18, 2017  5:48 PM Ivar Drape wrote: CRM for notification. See Telephone encounter for:  04/18/17. Patient's wife, Mardene Celeste, wanted the provider to know know the patient fell on Friday and injured his leg and is having trouble getting around and he is in pain.  They want to know what to do.  Please call [phone]

## 2017-04-19 ENCOUNTER — Emergency Department (HOSPITAL_COMMUNITY): Payer: PPO | Admitting: Anesthesiology

## 2017-04-19 ENCOUNTER — Encounter (HOSPITAL_COMMUNITY)
Admission: EM | Disposition: A | Discharge status: Skilled Nursing Facility | Payer: Self-pay | Source: Home / Self Care | Attending: Internal Medicine

## 2017-04-19 ENCOUNTER — Inpatient Hospital Stay (HOSPITAL_COMMUNITY): Payer: PPO

## 2017-04-19 ENCOUNTER — Encounter (HOSPITAL_COMMUNITY): Payer: Self-pay

## 2017-04-19 ENCOUNTER — Emergency Department (HOSPITAL_COMMUNITY): Payer: PPO

## 2017-04-19 ENCOUNTER — Inpatient Hospital Stay (HOSPITAL_COMMUNITY)
Admission: EM | Admit: 2017-04-19 | Discharge: 2017-04-22 | DRG: 470 | Disposition: A | Discharge status: Skilled Nursing Facility | Payer: PPO | Attending: Internal Medicine | Admitting: Internal Medicine

## 2017-04-19 DIAGNOSIS — S72002A Fracture of unspecified part of neck of left femur, initial encounter for closed fracture: Secondary | ICD-10-CM | POA: Diagnosis present

## 2017-04-19 DIAGNOSIS — S79911A Unspecified injury of right hip, initial encounter: Secondary | ICD-10-CM | POA: Diagnosis not present

## 2017-04-19 DIAGNOSIS — Z96641 Presence of right artificial hip joint: Secondary | ICD-10-CM | POA: Diagnosis not present

## 2017-04-19 DIAGNOSIS — G4733 Obstructive sleep apnea (adult) (pediatric): Secondary | ICD-10-CM | POA: Diagnosis not present

## 2017-04-19 DIAGNOSIS — J9801 Acute bronchospasm: Secondary | ICD-10-CM

## 2017-04-19 DIAGNOSIS — I442 Atrioventricular block, complete: Secondary | ICD-10-CM | POA: Diagnosis present

## 2017-04-19 DIAGNOSIS — G629 Polyneuropathy, unspecified: Secondary | ICD-10-CM | POA: Diagnosis not present

## 2017-04-19 DIAGNOSIS — Z87891 Personal history of nicotine dependence: Secondary | ICD-10-CM

## 2017-04-19 DIAGNOSIS — G20 Parkinson's disease: Secondary | ICD-10-CM | POA: Diagnosis present

## 2017-04-19 DIAGNOSIS — W1830XA Fall on same level, unspecified, initial encounter: Secondary | ICD-10-CM | POA: Diagnosis present

## 2017-04-19 DIAGNOSIS — S299XXA Unspecified injury of thorax, initial encounter: Secondary | ICD-10-CM | POA: Diagnosis not present

## 2017-04-19 DIAGNOSIS — S72091A Other fracture of head and neck of right femur, initial encounter for closed fracture: Secondary | ICD-10-CM | POA: Diagnosis not present

## 2017-04-19 DIAGNOSIS — R278 Other lack of coordination: Secondary | ICD-10-CM | POA: Diagnosis not present

## 2017-04-19 DIAGNOSIS — Z7901 Long term (current) use of anticoagulants: Secondary | ICD-10-CM

## 2017-04-19 DIAGNOSIS — I10 Essential (primary) hypertension: Secondary | ICD-10-CM | POA: Diagnosis not present

## 2017-04-19 DIAGNOSIS — Z8781 Personal history of (healed) traumatic fracture: Secondary | ICD-10-CM

## 2017-04-19 DIAGNOSIS — G8911 Acute pain due to trauma: Secondary | ICD-10-CM | POA: Diagnosis not present

## 2017-04-19 DIAGNOSIS — W19XXXA Unspecified fall, initial encounter: Secondary | ICD-10-CM | POA: Diagnosis not present

## 2017-04-19 DIAGNOSIS — D472 Monoclonal gammopathy: Secondary | ICD-10-CM | POA: Diagnosis not present

## 2017-04-19 DIAGNOSIS — I5030 Unspecified diastolic (congestive) heart failure: Secondary | ICD-10-CM | POA: Diagnosis not present

## 2017-04-19 DIAGNOSIS — I252 Old myocardial infarction: Secondary | ICD-10-CM | POA: Diagnosis not present

## 2017-04-19 DIAGNOSIS — I2510 Atherosclerotic heart disease of native coronary artery without angina pectoris: Secondary | ICD-10-CM | POA: Diagnosis not present

## 2017-04-19 DIAGNOSIS — I110 Hypertensive heart disease with heart failure: Secondary | ICD-10-CM | POA: Diagnosis not present

## 2017-04-19 DIAGNOSIS — S72041A Displaced fracture of base of neck of right femur, initial encounter for closed fracture: Secondary | ICD-10-CM | POA: Diagnosis not present

## 2017-04-19 DIAGNOSIS — M80851A Other osteoporosis with current pathological fracture, right femur, initial encounter for fracture: Secondary | ICD-10-CM | POA: Diagnosis not present

## 2017-04-19 DIAGNOSIS — M810 Age-related osteoporosis without current pathological fracture: Secondary | ICD-10-CM | POA: Diagnosis present

## 2017-04-19 DIAGNOSIS — M6281 Muscle weakness (generalized): Secondary | ICD-10-CM | POA: Diagnosis not present

## 2017-04-19 DIAGNOSIS — M1990 Unspecified osteoarthritis, unspecified site: Secondary | ICD-10-CM | POA: Diagnosis present

## 2017-04-19 DIAGNOSIS — Z888 Allergy status to other drugs, medicaments and biological substances status: Secondary | ICD-10-CM | POA: Diagnosis not present

## 2017-04-19 DIAGNOSIS — I5032 Chronic diastolic (congestive) heart failure: Secondary | ICD-10-CM | POA: Diagnosis not present

## 2017-04-19 DIAGNOSIS — N400 Enlarged prostate without lower urinary tract symptoms: Secondary | ICD-10-CM | POA: Diagnosis not present

## 2017-04-19 DIAGNOSIS — I739 Peripheral vascular disease, unspecified: Secondary | ICD-10-CM | POA: Diagnosis present

## 2017-04-19 DIAGNOSIS — S72001A Fracture of unspecified part of neck of right femur, initial encounter for closed fracture: Secondary | ICD-10-CM | POA: Diagnosis not present

## 2017-04-19 DIAGNOSIS — I4891 Unspecified atrial fibrillation: Secondary | ICD-10-CM | POA: Diagnosis not present

## 2017-04-19 DIAGNOSIS — Z8249 Family history of ischemic heart disease and other diseases of the circulatory system: Secondary | ICD-10-CM

## 2017-04-19 DIAGNOSIS — Z881 Allergy status to other antibiotic agents status: Secondary | ICD-10-CM

## 2017-04-19 DIAGNOSIS — R079 Chest pain, unspecified: Secondary | ICD-10-CM | POA: Diagnosis not present

## 2017-04-19 DIAGNOSIS — R001 Bradycardia, unspecified: Secondary | ICD-10-CM | POA: Diagnosis not present

## 2017-04-19 DIAGNOSIS — I2721 Secondary pulmonary arterial hypertension: Secondary | ICD-10-CM | POA: Diagnosis not present

## 2017-04-19 DIAGNOSIS — R2681 Unsteadiness on feet: Secondary | ICD-10-CM | POA: Diagnosis not present

## 2017-04-19 DIAGNOSIS — Z471 Aftercare following joint replacement surgery: Secondary | ICD-10-CM | POA: Diagnosis not present

## 2017-04-19 DIAGNOSIS — W102XXA Fall (on)(from) incline, initial encounter: Secondary | ICD-10-CM | POA: Diagnosis not present

## 2017-04-19 DIAGNOSIS — R41841 Cognitive communication deficit: Secondary | ICD-10-CM | POA: Diagnosis not present

## 2017-04-19 DIAGNOSIS — T148XXA Other injury of unspecified body region, initial encounter: Secondary | ICD-10-CM | POA: Diagnosis not present

## 2017-04-19 DIAGNOSIS — I482 Chronic atrial fibrillation: Secondary | ICD-10-CM | POA: Diagnosis present

## 2017-04-19 DIAGNOSIS — M79651 Pain in right thigh: Secondary | ICD-10-CM | POA: Diagnosis not present

## 2017-04-19 DIAGNOSIS — R488 Other symbolic dysfunctions: Secondary | ICD-10-CM | POA: Diagnosis not present

## 2017-04-19 DIAGNOSIS — I878 Other specified disorders of veins: Secondary | ICD-10-CM | POA: Diagnosis not present

## 2017-04-19 DIAGNOSIS — K219 Gastro-esophageal reflux disease without esophagitis: Secondary | ICD-10-CM | POA: Diagnosis present

## 2017-04-19 DIAGNOSIS — Z9889 Other specified postprocedural states: Secondary | ICD-10-CM

## 2017-04-19 DIAGNOSIS — S72009A Fracture of unspecified part of neck of unspecified femur, initial encounter for closed fracture: Secondary | ICD-10-CM | POA: Diagnosis present

## 2017-04-19 HISTORY — PX: HIP ARTHROPLASTY: SHX981

## 2017-04-19 HISTORY — DX: Fracture of unspecified part of neck of right femur, initial encounter for closed fracture: S72.001A

## 2017-04-19 HISTORY — PX: HEMIARTHROPLASTY HIP: SUR652

## 2017-04-19 LAB — URINALYSIS, ROUTINE W REFLEX MICROSCOPIC
BILIRUBIN URINE: NEGATIVE
GLUCOSE, UA: NEGATIVE mg/dL
Hgb urine dipstick: NEGATIVE
KETONES UR: 20 mg/dL — AB
Leukocytes, UA: NEGATIVE
NITRITE: NEGATIVE
Protein, ur: NEGATIVE mg/dL
SPECIFIC GRAVITY, URINE: 1.025 (ref 1.005–1.030)
pH: 5 (ref 5.0–8.0)

## 2017-04-19 LAB — CBC
HEMATOCRIT: 37.6 % — AB (ref 39.0–52.0)
HEMOGLOBIN: 12.1 g/dL — AB (ref 13.0–17.0)
MCH: 30.3 pg (ref 26.0–34.0)
MCHC: 32.2 g/dL (ref 30.0–36.0)
MCV: 94.2 fL (ref 78.0–100.0)
Platelets: 276 10*3/uL (ref 150–400)
RBC: 3.99 MIL/uL — ABNORMAL LOW (ref 4.22–5.81)
RDW: 15.9 % — AB (ref 11.5–15.5)
WBC: 10.4 10*3/uL (ref 4.0–10.5)

## 2017-04-19 LAB — BASIC METABOLIC PANEL
ANION GAP: 10 (ref 5–15)
BUN: 26 mg/dL — AB (ref 6–20)
CHLORIDE: 104 mmol/L (ref 101–111)
CO2: 24 mmol/L (ref 22–32)
Calcium: 9.1 mg/dL (ref 8.9–10.3)
Creatinine, Ser: 0.86 mg/dL (ref 0.61–1.24)
GFR calc Af Amer: >60 mL/min (ref >60)
GLUCOSE: 106 mg/dL — AB (ref 65–99)
POTASSIUM: 4.1 mmol/L (ref 3.5–5.1)
Sodium: 138 mmol/L (ref 135–145)

## 2017-04-19 LAB — PREPARE RBC (CROSSMATCH)

## 2017-04-19 LAB — ABO/RH: ABO/RH(D): O POS

## 2017-04-19 LAB — CBG MONITORING, ED: Glucose-Capillary: 77 mg/dL (ref 65–99)

## 2017-04-19 SURGERY — HEMIARTHROPLASTY, HIP, DIRECT ANTERIOR APPROACH, FOR FRACTURE
Anesthesia: General | Site: Hip | Laterality: Right

## 2017-04-19 MED ORDER — ARTIFICIAL TEARS OPHTHALMIC OINT
TOPICAL_OINTMENT | OPHTHALMIC | Status: DC | PRN
  Administered 2017-04-19: 1 via OPHTHALMIC

## 2017-04-19 MED ORDER — ALBUTEROL SULFATE (2.5 MG/3ML) 0.083% IN NEBU: 3.0000 mL | INHALATION_SOLUTION | RESPIRATORY_TRACT | Status: DC | PRN

## 2017-04-19 MED ORDER — SUGAMMADEX SODIUM 200 MG/2ML IV SOLN
INTRAVENOUS | Status: AC
  Filled 2017-04-19: qty 2

## 2017-04-19 MED ORDER — FAMOTIDINE 10 MG PO TABS
10.0000 mg | ORAL_TABLET | Freq: Every day | ORAL | Status: DC
  Administered 2017-04-20 – 2017-04-21 (×3): 10 mg via ORAL
  Filled 2017-04-19 (×4): qty 1

## 2017-04-19 MED ORDER — LACTATED RINGERS IV SOLN
INTRAVENOUS | Status: DC | PRN
  Administered 2017-04-19: 19:00:00 via INTRAVENOUS

## 2017-04-19 MED ORDER — BISACODYL 5 MG PO TBEC: 5.0000 mg | DELAYED_RELEASE_TABLET | Freq: Every day | ORAL | 0 refills | Status: DC | PRN

## 2017-04-19 MED ORDER — TIMOLOL MALEATE 0.5 % OP SOLN
1.0000 [drp] | Freq: Every day | OPHTHALMIC | Status: DC
Start: 2017-04-19 — End: 2017-04-23
  Administered 2017-04-20 – 2017-04-21 (×3): 1 [drp] via OPHTHALMIC
  Filled 2017-04-19: qty 5

## 2017-04-19 MED ORDER — HYDROCHLOROTHIAZIDE 12.5 MG PO CAPS
12.5000 mg | ORAL_CAPSULE | Freq: Every day | ORAL | Status: DC
  Administered 2017-04-20 – 2017-04-22 (×3): 12.5 mg via ORAL
  Filled 2017-04-19 (×3): qty 1

## 2017-04-19 MED ORDER — ALUM & MAG HYDROXIDE-SIMETH 200-200-20 MG/5ML PO SUSP: 30.0000 mL | ORAL | Status: DC | PRN

## 2017-04-19 MED ORDER — DOCUSATE SODIUM 100 MG PO CAPS
100.0000 mg | ORAL_CAPSULE | Freq: Every evening | ORAL | Status: DC | PRN
  Administered 2017-04-21: 100 mg via ORAL

## 2017-04-19 MED ORDER — FENTANYL CITRATE (PF) 250 MCG/5ML IJ SOLN
INTRAMUSCULAR | Status: AC
  Filled 2017-04-19: qty 5

## 2017-04-19 MED ORDER — ACETAMINOPHEN 650 MG RE SUPP: 650.0000 mg | Freq: Four times a day (QID) | RECTAL | Status: DC | PRN

## 2017-04-19 MED ORDER — PHENOL 1.4 % MT LIQD: 1.0000 | OROMUCOSAL | Status: DC | PRN

## 2017-04-19 MED ORDER — TIMOLOL MALEATE 0.5 % OP SOLN
1.0000 [drp] | Freq: Every day | OPHTHALMIC | Status: DC
  Administered 2017-04-20 – 2017-04-22 (×3): 1 [drp] via OPHTHALMIC

## 2017-04-19 MED ORDER — ONDANSETRON HCL 4 MG/2ML IJ SOLN
INTRAMUSCULAR | Status: AC
  Filled 2017-04-19: qty 2

## 2017-04-19 MED ORDER — ONDANSETRON HCL 4 MG PO TABS: 4.0000 mg | ORAL_TABLET | Freq: Four times a day (QID) | ORAL | Status: DC | PRN

## 2017-04-19 MED ORDER — EPHEDRINE 5 MG/ML INJ
INTRAVENOUS | Status: AC
Start: 2017-04-19 — End: 2017-04-19
  Filled 2017-04-19: qty 10

## 2017-04-19 MED ORDER — CARBIDOPA-LEVODOPA 25-100 MG PO TABS
1.0000 | ORAL_TABLET | Freq: Three times a day (TID) | ORAL | Status: DC
  Administered 2017-04-20 – 2017-04-22 (×9): 1 via ORAL
  Filled 2017-04-19 (×9): qty 1

## 2017-04-19 MED ORDER — SENNA 8.6 MG PO TABS
1.0000 | ORAL_TABLET | Freq: Two times a day (BID) | ORAL | Status: DC
  Administered 2017-04-20 – 2017-04-22 (×2): 8.6 mg via ORAL
  Filled 2017-04-19 (×3): qty 1

## 2017-04-19 MED ORDER — LIDOCAINE 2% (20 MG/ML) 5 ML SYRINGE
INTRAMUSCULAR | Status: AC
  Filled 2017-04-19: qty 5

## 2017-04-19 MED ORDER — POVIDONE-IODINE 10 % EX SWAB: 2.0000 "application " | Freq: Once | CUTANEOUS | Status: DC

## 2017-04-19 MED ORDER — MAGNESIUM CITRATE PO SOLN: 1.0000 | Freq: Once | ORAL | Status: DC | PRN

## 2017-04-19 MED ORDER — SENNA-DOCUSATE SODIUM 8.6-50 MG PO TABS: 2.0000 | ORAL_TABLET | Freq: Every day | ORAL | 1 refills | Status: DC

## 2017-04-19 MED ORDER — VITAMIN C 500 MG PO TABS
500.0000 mg | ORAL_TABLET | Freq: Every day | ORAL | Status: DC
  Administered 2017-04-20 – 2017-04-22 (×3): 500 mg via ORAL
  Filled 2017-04-19 (×3): qty 1

## 2017-04-19 MED ORDER — CHLORHEXIDINE GLUCONATE 4 % EX LIQD: 60.0000 mL | Freq: Once | CUTANEOUS | Status: DC

## 2017-04-19 MED ORDER — BUPIVACAINE HCL (PF) 0.25 % IJ SOLN
INTRAMUSCULAR | Status: AC
  Filled 2017-04-19: qty 30

## 2017-04-19 MED ORDER — POLYETHYLENE GLYCOL 3350 17 G PO PACK: 17.0000 g | PACK | Freq: Every day | ORAL | Status: DC | PRN

## 2017-04-19 MED ORDER — LISINOPRIL 10 MG PO TABS
10.0000 mg | ORAL_TABLET | Freq: Every day | ORAL | Status: DC
  Administered 2017-04-20 – 2017-04-22 (×3): 10 mg via ORAL
  Filled 2017-04-19 (×3): qty 1

## 2017-04-19 MED ORDER — FENTANYL CITRATE (PF) 100 MCG/2ML IJ SOLN: 25.0000 ug | INTRAMUSCULAR | Status: DC | PRN

## 2017-04-19 MED ORDER — CEFAZOLIN SODIUM-DEXTROSE 2-4 GM/100ML-% IV SOLN
2.0000 g | Freq: Four times a day (QID) | INTRAVENOUS | Status: AC
  Administered 2017-04-20 (×2): 2 g via INTRAVENOUS
  Filled 2017-04-19 (×2): qty 100

## 2017-04-19 MED ORDER — PROPOFOL 10 MG/ML IV BOLUS
INTRAVENOUS | Status: DC | PRN
  Administered 2017-04-19: 50 mg via INTRAVENOUS

## 2017-04-19 MED ORDER — ACETAMINOPHEN 325 MG PO TABS: 650.0000 mg | ORAL_TABLET | Freq: Four times a day (QID) | ORAL | Status: DC | PRN

## 2017-04-19 MED ORDER — ONDANSETRON HCL 4 MG/2ML IJ SOLN
INTRAMUSCULAR | Status: DC | PRN
  Administered 2017-04-19: 4 mg via INTRAVENOUS

## 2017-04-19 MED ORDER — DEXAMETHASONE SODIUM PHOSPHATE 10 MG/ML IJ SOLN
INTRAMUSCULAR | Status: AC
  Filled 2017-04-19: qty 1

## 2017-04-19 MED ORDER — BISACODYL 10 MG RE SUPP: 10.0000 mg | Freq: Every day | RECTAL | Status: DC | PRN

## 2017-04-19 MED ORDER — SUGAMMADEX SODIUM 200 MG/2ML IV SOLN
INTRAVENOUS | Status: DC | PRN
  Administered 2017-04-19: 150 mg via INTRAVENOUS

## 2017-04-19 MED ORDER — SODIUM CHLORIDE 0.9 % IV SOLN
75.0000 mL/h | INTRAVENOUS | Status: DC
  Administered 2017-04-20: 75 mL/h via INTRAVENOUS

## 2017-04-19 MED ORDER — ONDANSETRON HCL 4 MG/2ML IJ SOLN: 4.0000 mg | Freq: Four times a day (QID) | INTRAMUSCULAR | Status: DC | PRN

## 2017-04-19 MED ORDER — ARTIFICIAL TEARS OPHTHALMIC OINT
TOPICAL_OINTMENT | OPHTHALMIC | Status: AC
  Filled 2017-04-19: qty 3.5

## 2017-04-19 MED ORDER — RIVAROXABAN 15 MG PO TABS
15.0000 mg | ORAL_TABLET | Freq: Every day | ORAL | Status: DC
  Administered 2017-04-20: 15 mg via ORAL
  Filled 2017-04-19 (×2): qty 1

## 2017-04-19 MED ORDER — ROCURONIUM BROMIDE 10 MG/ML (PF) SYRINGE
PREFILLED_SYRINGE | INTRAVENOUS | Status: AC
Start: 2017-04-19 — End: 2017-04-19
  Filled 2017-04-19: qty 5

## 2017-04-19 MED ORDER — CEFAZOLIN SODIUM-DEXTROSE 2-4 GM/100ML-% IV SOLN
INTRAVENOUS | Status: AC
Start: 2017-04-19 — End: 2017-04-19
  Filled 2017-04-19: qty 100

## 2017-04-19 MED ORDER — HYDROCORTISONE 1 % EX CREA: 1.0000 "application " | TOPICAL_CREAM | Freq: Every day | CUTANEOUS | Status: DC | PRN

## 2017-04-19 MED ORDER — MORPHINE SULFATE (PF) 2 MG/ML IV SOLN: 2.0000 mg | INTRAVENOUS | Status: DC | PRN

## 2017-04-19 MED ORDER — FENTANYL CITRATE (PF) 100 MCG/2ML IJ SOLN
INTRAMUSCULAR | Status: DC | PRN
  Administered 2017-04-19 (×2): 50 ug via INTRAVENOUS
  Administered 2017-04-19: 25 ug via INTRAVENOUS

## 2017-04-19 MED ORDER — FERROUS SULFATE 325 (65 FE) MG PO TABS
325.0000 mg | ORAL_TABLET | Freq: Three times a day (TID) | ORAL | Status: DC
  Administered 2017-04-20 – 2017-04-22 (×9): 325 mg via ORAL
  Filled 2017-04-19 (×9): qty 1

## 2017-04-19 MED ORDER — SODIUM CHLORIDE 0.9 % IR SOLN
Status: DC | PRN
  Administered 2017-04-19: 1000 mL

## 2017-04-19 MED ORDER — SODIUM CHLORIDE 0.9 % IV SOLN: Freq: Once | INTRAVENOUS | Status: DC

## 2017-04-19 MED ORDER — HYDROCODONE-ACETAMINOPHEN 5-325 MG PO TABS: 1.0000 | ORAL_TABLET | Freq: Four times a day (QID) | ORAL | 0 refills | Status: DC | PRN

## 2017-04-19 MED ORDER — BUPIVACAINE HCL (PF) 0.25 % IJ SOLN
INTRAMUSCULAR | Status: DC | PRN
  Administered 2017-04-19: 20 mL

## 2017-04-19 MED ORDER — HYDROCODONE-ACETAMINOPHEN 5-325 MG PO TABS: 1.0000 | ORAL_TABLET | Freq: Four times a day (QID) | ORAL | Status: DC | PRN

## 2017-04-19 MED ORDER — ADULT MULTIVITAMIN W/MINERALS CH
1.0000 | ORAL_TABLET | Freq: Every day | ORAL | Status: DC
  Administered 2017-04-20 – 2017-04-22 (×3): 1 via ORAL
  Filled 2017-04-19 (×3): qty 1

## 2017-04-19 MED ORDER — MENTHOL 3 MG MT LOZG: 1.0000 | LOZENGE | OROMUCOSAL | Status: DC | PRN

## 2017-04-19 MED ORDER — DEXAMETHASONE SODIUM PHOSPHATE 10 MG/ML IJ SOLN
INTRAMUSCULAR | Status: DC | PRN
  Administered 2017-04-19: 5 mg via INTRAVENOUS

## 2017-04-19 MED ORDER — LACTATED RINGERS IV SOLN
INTRAVENOUS | Status: DC
Start: 2017-04-19 — End: 2017-04-19
  Administered 2017-04-19: 19:00:00 via INTRAVENOUS

## 2017-04-19 MED ORDER — DOCUSATE SODIUM 100 MG PO CAPS
100.0000 mg | ORAL_CAPSULE | Freq: Two times a day (BID) | ORAL | Status: DC
  Administered 2017-04-20 – 2017-04-22 (×3): 100 mg via ORAL
  Filled 2017-04-19 (×4): qty 1

## 2017-04-19 MED ORDER — MIDAZOLAM HCL 2 MG/2ML IJ SOLN
INTRAMUSCULAR | Status: AC
  Filled 2017-04-19: qty 2

## 2017-04-19 MED ORDER — EPHEDRINE SULFATE 50 MG/ML IJ SOLN
INTRAMUSCULAR | Status: DC | PRN
  Administered 2017-04-19: 10 mg via INTRAVENOUS
  Administered 2017-04-19 (×3): 5 mg via INTRAVENOUS

## 2017-04-19 MED ORDER — MAGNESIUM CITRATE PO SOLN
0.5000 | Freq: Every day | ORAL | Status: DC
  Administered 2017-04-22: 0.5 via ORAL
  Filled 2017-04-19 (×2): qty 296

## 2017-04-19 MED ORDER — ROCURONIUM BROMIDE 100 MG/10ML IV SOLN
INTRAVENOUS | Status: DC | PRN
  Administered 2017-04-19: 40 mg via INTRAVENOUS
  Administered 2017-04-19: 20 mg via INTRAVENOUS

## 2017-04-19 MED ORDER — LISINOPRIL-HYDROCHLOROTHIAZIDE 10-12.5 MG PO TABS: 1.0000 | ORAL_TABLET | Freq: Every day | ORAL | Status: DC

## 2017-04-19 MED ORDER — LIDOCAINE HCL (CARDIAC) 20 MG/ML IV SOLN
INTRAVENOUS | Status: DC | PRN
  Administered 2017-04-19: 40 mg via INTRAVENOUS

## 2017-04-19 MED ORDER — PROPOFOL 10 MG/ML IV BOLUS
INTRAVENOUS | Status: AC
Start: 2017-04-19 — End: 2017-04-19
  Filled 2017-04-19: qty 20

## 2017-04-19 MED ORDER — CEFAZOLIN SODIUM-DEXTROSE 2-4 GM/100ML-% IV SOLN
2.0000 g | INTRAVENOUS | Status: AC
  Administered 2017-04-19: 2 g via INTRAVENOUS

## 2017-04-19 SURGICAL SUPPLY — 58 items
APL SKNCLS STERI-STRIP NONHPOA (GAUZE/BANDAGES/DRESSINGS) ×1
BENZOIN TINCTURE PRP APPL 2/3 (GAUZE/BANDAGES/DRESSINGS) ×2 IMPLANT
BLADE SAW SGTL 18.5X63.X.64 HD (BLADE) ×2 IMPLANT
BRUSH FEMORAL CANAL (MISCELLANEOUS) IMPLANT
CAPT HIP HEMI 1 ×1 IMPLANT
CLSR STERI-STRIP ANTIMIC 1/2X4 (GAUZE/BANDAGES/DRESSINGS) ×2 IMPLANT
COVER BACK TABLE 24X17X13 BIG (DRAPES) IMPLANT
COVER SURGICAL LIGHT HANDLE (MISCELLANEOUS) ×2 IMPLANT
DRAPE IMP U-DRAPE 54X76 (DRAPES) ×2 IMPLANT
DRAPE INCISE IOBAN 66X45 STRL (DRAPES) ×1 IMPLANT
DRAPE ORTHO SPLIT 77X108 STRL (DRAPES) ×4
DRAPE SURG ORHT 6 SPLT 77X108 (DRAPES) ×2 IMPLANT
DRAPE U-SHAPE 47X51 STRL (DRAPES) ×2 IMPLANT
DRILL BIT 5/64 (BIT) ×2 IMPLANT
DRSG MEPILEX BORDER 4X12 (GAUZE/BANDAGES/DRESSINGS) IMPLANT
DRSG MEPILEX BORDER 4X8 (GAUZE/BANDAGES/DRESSINGS) ×1 IMPLANT
DURAPREP 26ML APPLICATOR (WOUND CARE) ×2 IMPLANT
ELECT BLADE 6.5 EXT (BLADE) IMPLANT
ELECT CAUTERY BLADE 6.4 (BLADE) ×2 IMPLANT
ELECT REM PT RETURN 9FT ADLT (ELECTROSURGICAL) ×2
ELECTRODE REM PT RTRN 9FT ADLT (ELECTROSURGICAL) ×1 IMPLANT
FACESHIELD WRAPAROUND (MASK) ×2 IMPLANT
FACESHIELD WRAPAROUND OR TEAM (MASK) ×2 IMPLANT
GLOVE BIOGEL PI ORTHO PRO SZ8 (GLOVE) ×1
GLOVE ORTHO TXT STRL SZ7.5 (GLOVE) ×2 IMPLANT
GLOVE PI ORTHO PRO STRL SZ8 (GLOVE) ×1 IMPLANT
GLOVE SURG ORTHO 8.0 STRL STRW (GLOVE) ×4 IMPLANT
GOWN STRL REUS W/ TWL XL LVL3 (GOWN DISPOSABLE) ×1 IMPLANT
GOWN STRL REUS W/TWL 2XL LVL3 (GOWN DISPOSABLE) ×2 IMPLANT
GOWN STRL REUS W/TWL XL LVL3 (GOWN DISPOSABLE) ×2
HANDPIECE INTERPULSE COAX TIP (DISPOSABLE)
KIT BASIN OR (CUSTOM PROCEDURE TRAY) ×2 IMPLANT
KIT ROOM TURNOVER OR (KITS) ×2 IMPLANT
MANIFOLD NEPTUNE II (INSTRUMENTS) ×2 IMPLANT
NDL SUT 6 .5 CRC .975X.05 MAYO (NEEDLE) IMPLANT
NEEDLE 22X1 1/2 (OR ONLY) (NEEDLE) ×2 IMPLANT
NEEDLE MAYO TAPER (NEEDLE) ×2
NS IRRIG 1000ML POUR BTL (IV SOLUTION) ×2 IMPLANT
PACK TOTAL JOINT (CUSTOM PROCEDURE TRAY) ×2 IMPLANT
PACK UNIVERSAL I (CUSTOM PROCEDURE TRAY) ×2 IMPLANT
PAD ARMBOARD 7.5X6 YLW CONV (MISCELLANEOUS) ×4 IMPLANT
PILLOW ABDUCTION HIP (SOFTGOODS) ×2 IMPLANT
PRESSURIZER FEMORAL UNIV (MISCELLANEOUS) IMPLANT
RETRIEVER SUT HEWSON (MISCELLANEOUS) ×2 IMPLANT
SET HNDPC FAN SPRY TIP SCT (DISPOSABLE) IMPLANT
SUT FIBERWIRE #2 38 REV NDL BL (SUTURE) ×6
SUT VIC AB 0 CT1 27 (SUTURE) ×2
SUT VIC AB 0 CT1 27XBRD ANBCTR (SUTURE) ×1 IMPLANT
SUT VIC AB 1 CT1 27 (SUTURE) ×4
SUT VIC AB 1 CT1 27XBRD ANBCTR (SUTURE) ×2 IMPLANT
SUT VIC AB 3-0 SH 8-18 (SUTURE) ×2 IMPLANT
SUTURE FIBERWR#2 38 REV NDL BL (SUTURE) ×2 IMPLANT
SYR CONTROL 10ML LL (SYRINGE) ×2 IMPLANT
TOWEL OR 17X24 6PK STRL BLUE (TOWEL DISPOSABLE) ×2 IMPLANT
TOWEL OR 17X26 10 PK STRL BLUE (TOWEL DISPOSABLE) ×2 IMPLANT
TOWER CARTRIDGE SMART MIX (DISPOSABLE) IMPLANT
TRAY FOLEY W/METER SILVER 16FR (SET/KITS/TRAYS/PACK) ×2 IMPLANT
WATER STERILE IRR 1000ML POUR (IV SOLUTION) ×4 IMPLANT

## 2017-04-19 NOTE — H&P (Addendum)
Date: 04/19/2017               Patient Name:  Chad [facility] MRN: [id number]  DOB: Feb 18, 1927 Age / Sex: 84 y.o., male   PCP: [name], MD         Medical Service: Internal Medicine Teaching Service         Attending Physician: Dr. Rayne Du att. providers found    First Contact: Dr. Vickki Muff Pager: [phone]  Second Contact: Dr. Lorella Nimrod Pager: [phone]       After Hours (After 5p/  First Contact Pager: [phone]  weekends / holidays): Second Contact Pager: [phone]   Chief Complaint: Right Femoral neck fracture  History of Present Illness: 84 year old male past medical history of CAD NSTEMI 2013, complete heart block, atrial fibrillation, hypertension, OSA, MGUS.  He presents to the emergency department after a fall that occurred on Friday resulting in a right femoral neck fracture.  The patient had no presyncope, no dizziness or lightheadedness he reports stepping wrong losing his balance on his tile floor.  He was down for less than a minute and his wife helped him up almost immediately.  He was a little sore but did okay for the next few days until Monday when he started to feel intense pain and could no longer walk up and down the stairs.  He knew then he needed to seek medical help.  He is normally very active individual, he has been having trouble with balance recently, he sees a neurologist for this is been tried on a trial of many different medicines, the latest of which is carbidopa/levodopa.  Generally he is healthy he has atrial fibrillation and complete heart block he chose to not get a pacemaker because he feels that he is not limited by his slow heart rate is and states he has had complete heart block for about 25 years.  Denies any preceding illness leading up to this, he has had no nausea vomiting or diarrhea.    ED course: x-rays demonstrate R femoral neck fracture, CBC and bmp unremarkable.  Orthopedic surgery consulted and will perform hemiarthroplasty  tomorrow.     Meds:  Current Meds  Medication Sig  . acetaminophen (TYLENOL) 325 MG tablet Take 650 mg by mouth every 6 (six) hours as needed for moderate pain.  Marland Kitchen albuterol (PROVENTIL HFA;VENTOLIN HFA) 108 (90 Base) MCG/ACT inhaler Inhale 1 puff into the lungs every 4 (four) hours as needed for wheezing or shortness of breath.  . carbidopa-levodopa (SINEMET IR) 25-100 MG tablet Take 1 tablet by mouth 3 (three) times daily.  Marland Kitchen docusate sodium (COLACE) 100 MG capsule Take 100 mg at bedtime as needed by mouth for mild constipation.  . hydrocortisone cream 1 % Apply 1 application daily as needed topically for itching.  Marland Kitchen lisinopril-hydrochlorothiazide (PRINZIDE,ZESTORETIC) 10-12.5 MG tablet Take 1 tablet by mouth daily.  . LUTEIN-ZEAXANTHIN PO Take 1 tablet daily by mouth.   Marland Kitchen MAGNESIUM CITRATE PO Take 250 mg daily by mouth.  . Misc Natural Products (PROSTATE) CAPS Take 1 tablet daily by mouth. Prostate Plus otc supplement  . Multiple Vitamin (MULTIVITAMIN WITH MINERALS) TABS tablet Take 1 tablet daily by mouth.  . Multiple Vitamins-Minerals (PRESERVISION AREDS 2+MULTI VIT PO) Take 1 tablet by mouth 2 (two) times daily.  Marland Kitchen OVER THE COUNTER MEDICATION Take 1 tablet 2 (two) times daily by mouth. Super beta prostate otc supplement  . Probiotic Product (PROBIOTIC DAILY PO) Take 1 tablet by mouth  daily.  . ranitidine (ZANTAC) 150 MG tablet Take 150 mg daily as needed by mouth for heartburn.  . timolol (TIMOPTIC) 0.5 % ophthalmic solution 1 drop See admin instructions. One drop in both eyes in the morning and one drop in the left eye at night.  . vitamin C (ASCORBIC ACID) 500 MG tablet Take 500 mg by mouth daily.  Alveda Reasons 20 MG TABS tablet TAKE ONE TABLET BY MOUTH DAILY WITH SUPPER (Patient taking differently: TAKE ONE TABLET (79m) BY MOUTH DAILY WITH SUPPER)     Allergies: Allergies as of 04/19/2017 - Review Complete 04/19/2017  Allergen Reaction Noted  . Doxycycline Other (See Comments)  06/05/2012  . Keflex [cephalexin] Other (See Comments) 12/08/2012   Past Medical History:  Diagnosis Date  . Arthritis   . Atrial fibrillation ([place]   . Balance problem 10/31/2013  . BPH (benign prostatic hyperplasia) 2014   [facility] [phone]  . Bradycardia    a. baseline HR in the 30's. Asymptomatic - no history of PPM placement.   .Marland KitchenCAD (coronary artery disease)    LHC 03/16/11: Mid LAD 10-20%, proximal circumflex 10%, mid RCA 20%, EF 55%.  . Cataract   . Complete heart block ([place]   . GERD (gastroesophageal reflux disease)    takes occasional  zantac  . Hx of echocardiogram    Echo 02/2011: EF 65-70%, normal wall motion, MAC, mild MR, mild LAE, mild RVE  . Hypertension   . OSA (obstructive sleep apnea)    per patient went to have sleep study done about 2 years ago, bu they never F/U with him about results; but wife reports he still has periods of apnea at night   . Talar fracture    casted no surg    Family History:  Family History  Problem Relation Age of Onset  . Hypertension Mother   . Multiple sclerosis Sister     Social History:  Social History   Socioeconomic History  . Marital status: Married    Spouse name: PMardene Celeste . Number of children: 0  . Years of education: 170 . Highest education level: Not on file  Social Needs  . Financial resource strain: Not on file  . Food insecurity - worry: Not on file  . Food insecurity - inability: Not on file  . Transportation needs - medical: Not on file  . Transportation needs - non-medical: Not on file  Occupational History  . Occupation: sScientist, product/process development . Occupation: cCivil engineer, contracting Tobacco Use  . Smoking status: Former Smoker    Packs/day: 1.00    Years: 3.00    Pack years: 3.00    Types: Cigarettes    Last attempt to quit: 03/17/1955    Years since quitting: 62.1  . Smokeless tobacco: Former UNetwork engineerand Sexual Activity  . Alcohol use: Yes    Alcohol/week: 8.4 oz    Types: 7  Cans of beer, 7 Standard drinks or equivalent per week    Comment: daily  . Drug use: No  . Sexual activity: No    Birth control/protection: None  Other Topics Concern  . Not on file  Social History Narrative   CCivil engineer, contracting- [place]   Highly Active Outdoors - no limitations in activity,is right handed   Lives with wife in a 2 story home.  Has no children.  Review of Systems: A complete ROS was negative except as per HPI.   Physical Exam: Blood pressure (!) 156/87, pulse (!) 40, temperature (!) 97.5 F (36.4 C), temperature source Oral, resp. rate 18, height 5' 11"  (1.803 m), weight 168 lb (76.2 kg), SpO2 97 %. Physical Exam  Constitutional: He is oriented to person, place, and time. No distress.  Eyes: Right eye exhibits no discharge. Left eye exhibits no discharge. No scleral icterus.  Neck: JVD (2cm above clavicle) present.  Cardiovascular: Regular rhythm, normal heart sounds and intact distal pulses. Bradycardia present. Exam reveals no gallop and no friction rub.  No murmur heard. Pulmonary/Chest: Effort normal and breath sounds normal. No respiratory distress. He has no wheezes. He has no rales.  Abdominal: Soft. Bowel sounds are normal. He exhibits no distension and no mass. There is no tenderness. There is no guarding.  Musculoskeletal: He exhibits edema (1-2+ LE edema).  Neurological: He is alert and oriented to person, place, and time.  Skin: He is not diaphoretic.    EKG: personally reviewed my interpretation is bradycardia, IVCD likely partial RBBB, junctional rhythm, Complete heart block, fibrillatory waves none of which conduct.  Poor R wave progression  CXR: personally reviewed my interpretation is none available  Assessment & Plan by Problem: Active Problems:   * No active hospital problems. *  R femoral neck fracture  -ortho consulted will perform R hemiarthroplasty tonight -focus will be on  pain management and early PT/OT  Permanent Afib Complete Heart Block  -not on rate control medications -continuous cardiac monitoring -hold xarelto for now   Dispo: Admit patient to Inpatient with expected length of stay greater than 2 midnights.  Signed: Katherine Roan, MD 04/19/2017, 6:40 PM  Vickki Muff MD PGY-1 Internal Medicine Pager # [phone]

## 2017-04-19 NOTE — ED Notes (Signed)
Ortho surgery at bedside at this time.

## 2017-04-19 NOTE — Anesthesia Procedure Notes (Signed)
Procedure Name: Intubation Date/Time: 04/19/2017 7:29 PM Performed by: Suzy Bouchard, CRNA Pre-anesthesia Checklist: Emergency Drugs available, Patient identified, Patient being monitored, Suction available and Timeout performed Patient Re-evaluated:Patient Re-evaluated prior to induction Oxygen Delivery Method: Circle system utilized Preoxygenation: Pre-oxygenation with 100% oxygen Induction Type: IV induction Ventilation: Mask ventilation without difficulty Laryngoscope Size: Miller and 2 Grade View: Grade I Tube type: Oral Tube size: 7.5 mm Number of attempts: 2 Airway Equipment and Method: Stylet Placement Confirmation: ETT inserted through vocal cords under direct vision,  positive ETCO2 and breath sounds checked- equal and bilateral Secured at: 22 cm Tube secured with: Tape Dental Injury: Teeth and Oropharynx as per pre-operative assessment  Comments: Gr 1 view both attempts with Mil 2.  Attempt 1 cords still moving.  Masked x1 minute more, cords open second attempt.

## 2017-04-19 NOTE — Anesthesia Postprocedure Evaluation (Signed)
Anesthesia Post Note  Patient: BLAYKE CORDREY  Procedure(s) Performed: ARTHROPLASTY BIPOLAR HIP (HEMIARTHROPLASTY) (Right Hip)     Patient location during evaluation: PACU Anesthesia Type: General Level of consciousness: awake and alert Pain management: pain level controlled Vital Signs Assessment: post-procedure vital signs reviewed and stable Respiratory status: spontaneous breathing, nonlabored ventilation, respiratory function stable and patient connected to nasal cannula oxygen Cardiovascular status: blood pressure returned to baseline and stable Postop Assessment: no apparent nausea or vomiting Anesthetic complications: no    Last Vitals:  Vitals:   04/19/17 1730 04/19/17 2139  BP: (!) 156/87 (!) 156/63  Pulse: (!) 40 (!) 37  Resp: 18 15  Temp:  36.8 C  SpO2: 97% 100%    Last Pain:  Vitals:   04/19/17 1448  TempSrc:   PainSc: 2                  Taffy Delconte,W. EDMOND

## 2017-04-19 NOTE — Consult Note (Addendum)
ORTHOPAEDIC CONSULTATION  REQUESTING PHYSICIAN: Mackuen, Courteney Lyn, *  Chief Complaint: Right hip pain  HPI: Chad Russell is a 84 y.o. male who complains of right hip pain after a mechanical fall that happened about 3 days ago.  Initially thought it was just his thigh pain, he was able to get around a little bit, but developed increasing pain and came to the emergency room.  Pain worse with weightbearing, better with rest.  Denies any other injuries in the fall.  He takes Xarelto on a daily basis, last dose was last night.  His cardiologist is Dr. Percival Spanish.  He last ate and drank last night.    Past Medical History:  Diagnosis Date  . Arthritis   . Atrial fibrillation ([place])   . Balance problem 10/31/2013  . BPH (benign prostatic hyperplasia) 2014   Urology Dr [name] with [facility] [phone]   . Bradycardia    a. baseline HR in the 30's. Asymptomatic - no history of PPM placement.   Marland Kitchen CAD (coronary artery disease)    LHC 03/16/11: Mid LAD 10-20%, proximal circumflex 10%, mid RCA 20%, EF 55%.  . Cataract   . Complete heart block ([place])   . GERD (gastroesophageal reflux disease)    takes occasional  zantac  . Hx of echocardiogram    Echo 02/2011: EF 65-70%, normal wall motion, MAC, mild MR, mild LAE, mild RVE  . Hypertension   . OSA (obstructive sleep apnea)    per patient went to have sleep study done about 2 years ago, bu they never F/U with him about results; but wife reports he still has periods of apnea at night   . Talar fracture    casted no surg   Past Surgical History:  Procedure Laterality Date  . ABDOMINAL AORTOGRAM W/LOWER EXTREMITY N/A 01/17/2017   Procedure: ABDOMINAL AORTOGRAM W/LOWER EXTREMITY;  Surgeon: Angelia Mould, MD;  Location: [place];  Service: Cardiovascular;  Laterality: N/A;  . COLONOSCOPY     Repeated and normal in 2011  . hemmorhoidectomy    . INGUINAL HERNIA REPAIR Right 02/26/2014   Procedure: LAPAROSCOPIC RIGHT INGUINAL  HERNIA REPAIR;  Surgeon: Ralene Ok, MD;  Location: [facility];  Service: General;  Laterality: Right;  . INSERTION OF MESH Right 02/26/2014   Procedure: INSERTION OF MESH;  Surgeon: Ralene Ok, MD;  Location: [facility];  Service: General;  Laterality: Right;  . LEFT HEART CATHETERIZATION WITH CORONARY ANGIOGRAM N/A 03/16/2011   Procedure: LEFT HEART CATHETERIZATION WITH CORONARY ANGIOGRAM;  Surgeon: Peter M Martinique, MD;  Location: [facility] CATH [facility];  Service: Cardiovascular;  Laterality: N/A;  . PROSTATE BIOPSY     negative - [facility]  . TRANSURETHRAL RESECTION OF PROSTATE N/A 09/07/2016   Procedure: TRANSURETHRAL RESECTION OF THE PROSTATE (TURP);  Surgeon: Irine Seal, MD;  Location: [facility];  Service: Urology;  Laterality: N/A;   Social History   Socioeconomic History  . Marital status: Married    Spouse name: Mardene Celeste  . Number of children: 0  . Years of education: 32  . Highest education level: None  Social Needs  . Financial resource strain: None  . Food insecurity - worry: None  . Food insecurity - inability: None  . Transportation needs - medical: None  . Transportation needs - non-medical: None  Occupational History  . Occupation: Scientist, product/process development  . Occupation: Civil engineer, contracting  Tobacco Use  . Smoking status: Former Smoker    Packs/day: 1.00    Years: 3.00  Pack years: 3.00    Types: Cigarettes    Last attempt to quit: 03/17/1955    Years since quitting: 62.1  . Smokeless tobacco: Former Network engineer and Sexual Activity  . Alcohol use: Yes    Alcohol/week: 8.4 oz    Types: 7 Cans of beer, 7 Standard drinks or equivalent per week    Comment: daily  . Drug use: No  . Sexual activity: No    Birth control/protection: None  Other Topics Concern  . 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   Has no children.     Family History  Problem Relation Age of Onset  . Hypertension Mother   . Multiple sclerosis Sister    Allergies  Allergen Reactions  . Doxycycline Other (See Comments)    Upset stomach  . Keflex [Cephalexin] Other (See Comments)    Abdominal disruption     Positive ROS: All other systems have been reviewed and were otherwise negative with the exception of those mentioned in the HPI and as above.  Physical Exam: General: Alert, no acute distress, mildly frail Cardiovascular: No pedal edema Respiratory: No cyanosis, no use of accessory musculature GI: No organomegaly, abdomen is soft and non-tender Skin: No lesions in the area of chief complaint Neurologic: Sensation intact distally Psychiatric: Patient is competent for consent with normal mood and affect Lymphatic: No axillary or cervical lymphadenopathy  MUSCULOSKELETAL: Right hip has shortening with external rotation and positive logroll, EHL intact, unable to lift the leg.  Assessment: Displaced right femoral neck fracture, multiple coexisting morbid comorbidities on Xarelto.   Plan: This is an acute severe injury, and threatens both morbidity and mortality, he wants to return to a previous level of function, he has elected for hemiarthroplasty.  The risks benefits and alternatives were discussed with the patient including but not limited to the risks of nonoperative treatment, versus surgical intervention including infection, bleeding, nerve injury, periprosthetic fracture, the need for revision surgery, dislocation, leg length discrepancy, blood clots, cardiopulmonary complications, morbidity, mortality, among others, and they were willing to proceed.    We may be able to proceed with surgical intervention later on today, depending on anesthesia and room availability.  N.p.o. for now.  His allergy to cephalexin was actually not an allergy, but some abdominal discomfort, and in fact he has tolerated  this subsequently.  Johnny Bridge, MD Cell [phone]   04/19/2017 3:45 PM

## 2017-04-19 NOTE — Op Note (Signed)
04/19/2017  9:07 PM  PATIENT:  Chad Russell   MRN: [id number]  PRE-OPERATIVE DIAGNOSIS:  Right femoral neck fracture  POST-OPERATIVE DIAGNOSIS:  Same  PROCEDURE:  Procedure(s): ARTHROPLASTY BIPOLAR HIP (HEMIARTHROPLASTY)  PREOPERATIVE INDICATIONS:  Chad Russell is an 84 y.o. male who was admitted 04/19/2017 with a diagnosis of Fracture of femoral neck, right ([place]) and elected for surgical management.  The risks benefits and alternatives were discussed with the patient including but not limited to the risks of nonoperative treatment, versus surgical intervention including infection, bleeding, nerve injury, periprosthetic fracture, the need for revision surgery, dislocation, leg length discrepancy, blood clots, cardiopulmonary complications, morbidity, mortality, among others, and they were willing to proceed.  Predicted outcome is good, although there will be at least a six to nine month expected recovery.   OPERATIVE REPORT     SURGEON:  Marchia Bond, MD    ASSISTANT:  Joya Gaskins, OPA-C  (Present throughout the entire procedure,  necessary for completion of procedure in a timely manner, assisting with retraction, instrumentation, and closure)     ANESTHESIA:  General  ESTIMATED BLOOD LOSS: 562    COMPLICATIONS:  None.     The neck aspects of the case: The femoral head was extremely large.  At the completion of the case I thought I saw the end of the sciatic nerve, as if I had cut it, however it was in fact the piriformis stump, which I then repaired back through drill tunnels.  The nerve was palpated and was found to be intact posteriorly.  The fracture was vertical, and I had excellent press-fit shell of the canal, although the medial aspect of the calcar fractured below my intended level of the prosthesis, but I did have a good fit and fill position.  I did not in fact cut the medial neck because the fracture ended so low.  I was between a size 5 and a size 6, and after  trialing the size 5 was loose, and so I was able to fill with a 6.  COMPONENTS:  Chemical engineer Femoral Fracture stem size 6, with a 0 spacer and a size 60 fracture head unipolar hip ball.    PROCEDURE IN DETAIL: The patient was met in the holding area and identified.  The appropriate hip  was marked at the operative site. The patient was then transported to the OR and  placed under general anesthesia.  At that point, the patient was  placed in the lateral decubitus position with the operative side up and  secured to the operating room table and all bony prominences padded.     The operative lower extremity was prepped from the iliac crest to the toes.  Sterile draping was performed.  Time out was performed prior to incision.      A routine posterolateral approach was utilized via sharp dissection  carried down to the subcutaneous tissue.  Gross bleeders were Bovie  coagulated.  The iliotibial band was identified and incised  along the length of the skin incision.  Self-retaining retractors were  inserted.  With the hip internally rotated, the short external rotators  were identified. The piriformis was tagged with FiberWire, and the hip capsule released in a T-type fashion.  The femoral neck was exposed, and I resected the femoral neck using the appropriate jig. This was performed at approximately a thumb's breadth above the lesser trochanter.    I then exposed the deep acetabulum, cleared out any tissue including  the ligamentum teres, and included the hip capsule in the FiberWire used above and below the T.    I then prepared the proximal femur using the cookie-cutter, the lateralizing reamer, and then sequentially broached.  A trial utilized, and I reduced the hip and it was found to have excellent stability with functional range of motion. The trial components were then removed.   I then place the real implant and I impacted the real head ball into place. The hip was then reduced  and taken through functional range of motion and found to have excellent stability. Leg lengths were restored.  I then used a 2 mm drill bits to pass the FiberWire suture from the capsule and piriformis through the greater trochanter, and secured this. Excellent posterior capsular repair was achieved. I also closed the T in the capsule.  I then irrigated the hip copiously again with pulse lavage, and repaired the fascia with Vicryl, followed by Vicryl for the subcutaneous tissue, Monocryl for the skin, Steri-Strips and sterile gauze. The wounds were injected. The patient was then awakened and returned to PACU in stable and satisfactory condition. There were no complications.  Marchia Bond, MD Orthopedic Surgeon [phone]   04/19/2017 9:07 PM

## 2017-04-19 NOTE — Consult Note (Signed)
Reason for Consult:Right fem neck fx Referring Physician: C MacKuen  Chad Russell is an 84 y.o. male with afib on Xarelto and heart block with bradycardia HPI: Chad Russell was in his usual state of health on Saturday when he turned in a doorway and fell to the ground. He denies presyncope or syncope or LOC but is not sure why he fell. He had right hip pain but was able to ambulate. The pain gradually got worse until he was unable to ambulate today and came to the hospital for evaluation. X-rays showed a right femoral neck fx and orthopedic surgery was consulted.   Past Medical History:  Diagnosis Date  . Arthritis   . Atrial fibrillation ([place])   . Balance problem 10/31/2013  . BPH (benign prostatic hyperplasia) 2014   Urology Dr [name] with [facility] [phone]   . Bradycardia    a. baseline HR in the 30's. Asymptomatic - no history of PPM placement.   Marland Kitchen CAD (coronary artery disease)    LHC 03/16/11: Mid LAD 10-20%, proximal circumflex 10%, mid RCA 20%, EF 55%.  . Cataract   . Complete heart block ([place])   . GERD (gastroesophageal reflux disease)    takes occasional  zantac  . Hx of echocardiogram    Echo 02/2011: EF 65-70%, normal wall motion, MAC, mild MR, mild LAE, mild RVE  . Hypertension   . OSA (obstructive sleep apnea)    per patient went to have sleep study done about 2 years ago, bu they never F/U with him about results; but wife reports he still has periods of apnea at night   . Talar fracture    casted no surg    Past Surgical History:  Procedure Laterality Date  . ABDOMINAL AORTOGRAM W/LOWER EXTREMITY N/A 01/17/2017   Procedure: ABDOMINAL AORTOGRAM W/LOWER EXTREMITY;  Surgeon: Angelia Mould, MD;  Location: [facility];  Service: Cardiovascular;  Laterality: N/A;  . COLONOSCOPY     Repeated and normal in 2011  . hemmorhoidectomy    . INGUINAL HERNIA REPAIR Right 02/26/2014   Procedure: LAPAROSCOPIC RIGHT INGUINAL HERNIA REPAIR;  Surgeon: Ralene Ok, MD;   Location: [facility];  Service: General;  Laterality: Right;  . INSERTION OF MESH Right 02/26/2014   Procedure: INSERTION OF MESH;  Surgeon: Ralene Ok, MD;  Location: [place];  Service: General;  Laterality: Right;  . LEFT HEART CATHETERIZATION WITH CORONARY ANGIOGRAM N/A 03/16/2011   Procedure: LEFT HEART CATHETERIZATION WITH CORONARY ANGIOGRAM;  Surgeon: Peter M Martinique, MD;  Location: [facility] CATH LAB;  Service: Cardiovascular;  Laterality: N/A;  . PROSTATE BIOPSY     negative - [facility]  . TRANSURETHRAL RESECTION OF PROSTATE N/A 09/07/2016   Procedure: TRANSURETHRAL RESECTION OF THE PROSTATE (TURP);  Surgeon: Irine Seal, MD;  Location: [facility];  Service: Urology;  Laterality: N/A;    Family History  Problem Relation Age of Onset  . Hypertension Mother   . Multiple sclerosis Sister     Social History:  reports that he quit smoking about 62 years ago. His smoking use included cigarettes. He has a 3.00 pack-year smoking history. He has quit using smokeless tobacco. He reports that he drinks about 8.4 oz of alcohol per week. He reports that he does not use drugs.  Allergies:  Allergies  Allergen Reactions  . Doxycycline Other (See Comments)    Upset stomach  . Keflex [Cephalexin] Other (See Comments)    Abdominal disruption    Medications: I have reviewed the  patient's current medications.  Results for orders placed or performed during the hospital encounter of 04/19/17 (from the past 48 hour(s))  Basic metabolic panel     Status: Abnormal   Collection Time: 04/19/17 12:30 PM  Result Value Ref Range   Sodium 138 135 - 145 mmol/L   Potassium 4.1 3.5 - 5.1 mmol/L   Chloride 104 101 - 111 mmol/L   CO2 24 22 - 32 mmol/L   Glucose, Bld 106 (H) 65 - 99 mg/dL   BUN 26 (H) 6 - 20 mg/dL   Creatinine, Ser 0.86 0.61 - 1.24 mg/dL   Calcium 9.1 8.9 - 10.3 mg/dL   GFR calc non Af Amer >60 >60 mL/min   GFR calc Af Amer >60 >60 mL/min    Comment: (NOTE) The eGFR has been calculated using  the CKD EPI equation. This calculation has not been validated in all clinical situations. eGFR's persistently <60 mL/min signify possible Chronic Kidney Disease.    Anion gap 10 5 - 15    Comment: Performed at [facility] [address] [place], [place] [address]  CBC     Status: Abnormal   Collection Time: 04/19/17 12:30 PM  Result Value Ref Range   WBC 10.4 4.0 - 10.5 K/uL   RBC 3.99 (L) 4.22 - 5.81 MIL/uL   Hemoglobin 12.1 (L) 13.0 - 17.0 g/dL   HCT 37.6 (L) 39.0 - 52.0 %   MCV 94.2 78.0 - 100.0 fL   MCH 30.3 26.0 - 34.0 pg   MCHC 32.2 30.0 - 36.0 g/dL   RDW 15.9 (H) 11.5 - 15.5 %   Platelets 276 150 - 400 K/uL    Comment: Performed at [facility] [address] [place], [place] [address]    Dg Chest 1 View  Result Date: 04/19/2017 CLINICAL DATA:  Pain following fall EXAM: CHEST 1 VIEW COMPARISON:  March 10, 2017 FINDINGS: There are areas of pleural plaque and calcification consistent with prior asbestos exposure. There is no appreciable edema or consolidation. Heart is mildly enlarged with pulmonary vascularity within normal limits. There is aortic atherosclerosis. No pneumothorax. No adenopathy. There is superior migration of the right humeral head. No acute fracture. IMPRESSION: Areas of pleural plaque and diaphragmatic/pleural calcification consistent with prior asbestos exposure. No edema or consolidation. Mild cardiomegaly. Aortic atherosclerosis. Superior migration of the right humeral head, a finding indicative of chronic rotator cuff tear. Aortic Atherosclerosis (ICD10-I70.0). Electronically Signed   By: Lowella Grip III M.D.   On: 04/19/2017 14:28   Dg Pelvis 1-2 Views  Result Date: 04/19/2017 CLINICAL DATA:  Fall with right femur and hip pain EXAM: PELVIS - 1-2 VIEW COMPARISON:  12/30/2016, 01/12/2012 FINDINGS: Pubic symphysis is intact. The left femoral head projects in joint. SI joints are non widened. Acute mildly displaced right femoral neck  fracture. Mild degenerative changes of the hips. Vascular calcification IMPRESSION: 1. Acute mildly displaced right femoral neck fracture 2. Mild arthritis of the bilateral hips Electronically Signed   By: Donavan Foil M.D.   On: 04/19/2017 14:11   Dg Femur Min 2 Views Right  Result Date: 04/19/2017 CLINICAL DATA:  Fall with leg pain EXAM: RIGHT FEMUR 2 VIEWS COMPARISON:  04/19/2017 FINDINGS: Right pubic rami are intact. Right femoral head projects in joint. Acute right femoral neck fracture with cephalad migration of the trochanter. Extensive vascular calcifications. Patellofemoral degenerative changes. No dislocation evident. IMPRESSION: Acute, mildly displaced right femoral neck fracture Electronically Signed   By: Maudie Mercury  Francoise Ceo M.D.   On: 04/19/2017 14:13    Review of Systems  Constitutional: Negative for weight loss.  HENT: Negative for ear discharge, ear pain, hearing loss and tinnitus.   Eyes: Negative for blurred vision, double vision, photophobia and pain.  Respiratory: Negative for cough, sputum production and shortness of breath.   Cardiovascular: Negative for chest pain.  Gastrointestinal: Negative for abdominal pain, nausea and vomiting.  Genitourinary: Negative for dysuria, flank pain, frequency and urgency.  Musculoskeletal: Positive for joint pain (Right hip). Negative for back pain, falls, myalgias and neck pain.  Neurological: Negative for dizziness, tingling, sensory change, focal weakness, loss of consciousness and headaches.  Endo/Heme/Allergies: Does not bruise/bleed easily.  Psychiatric/Behavioral: Negative for depression, memory loss and substance abuse. The patient is not nervous/anxious.    Blood pressure (!) 150/68, pulse (!) 38, temperature (!) 97.5 F (36.4 C), temperature source Oral, resp. rate 12, height _0  (1.803 m), weight 76.2 kg (168 lb), SpO2 99 %. Physical Exam  Constitutional: He appears well-developed and well-nourished. No distress.  HENT:  Head:  Normocephalic and atraumatic.  Eyes: Conjunctivae are normal. Right eye exhibits no discharge. Left eye exhibits no discharge. No scleral icterus.  Neck: Normal range of motion.  Cardiovascular: Regular rhythm. Bradycardia present.  Respiratory: Effort normal. No respiratory distress.  Musculoskeletal:  RLE No traumatic wounds, ecchymosis, or rash  TTP hip  No knee or ankle effusion  Knee stable to varus/ valgus and anterior/posterior stress  Sens DPN, SPN, TN intact  Motor EHL, ext, flex, evers 5/5  DP 0, PT 2+, No significant edema  LLE No traumatic wounds, ecchymosis, or rash  Nontender  No knee or ankle effusion  Knee stable to varus/ valgus and anterior/posterior stress  Sens DPN, SPN, TN intact  Motor EHL, ext, flex, evers 5/5  DP 0, PT 2+, No significant edema  Neurological: He is alert.  Skin: Skin is warm and dry. He is not diaphoretic.  Psychiatric: He has a normal mood and affect. His behavior is normal.    Assessment/Plan: Fall Right femoral neck fx -- Will need THA vs hemi by Dr. Mardelle Matte in the next day or two once his Xarelto is out of his system. He last took it Monday night. Diminished DP pulses -- He says that this has been worked up and found to not be a problem    Lisette Abu, PA-C Orthopedic Surgery [phone] 04/19/2017, 3:39 PM

## 2017-04-19 NOTE — Anesthesia Preprocedure Evaluation (Signed)
Anesthesia Evaluation  Patient identified by MRN, date of birth, ID band Patient awake    Reviewed: Allergy & Precautions, H&P , NPO status , Patient's Chart, lab work & pertinent test results  Airway Mallampati: II  TM Distance: >3 FB Neck ROM: Full    Dental no notable dental hx. (+) Teeth Intact, Dental Advisory Given   Pulmonary neg pulmonary ROS, former smoker,    Pulmonary exam normal breath sounds clear to auscultation       Cardiovascular hypertension, Pt. on medications + dysrhythmias Atrial Fibrillation  Rhythm:Regular Rate:Bradycardia     Neuro/Psych negative neurological ROS  negative psych ROS   GI/Hepatic Neg liver ROS, GERD  Medicated and Controlled,  Endo/Other  negative endocrine ROS  Renal/GU negative Renal ROS  negative genitourinary   Musculoskeletal  (+) Arthritis , Osteoarthritis,    Abdominal   Peds  Hematology negative hematology ROS (+)   Anesthesia Other Findings   Reproductive/Obstetrics negative OB ROS                             Anesthesia Physical Anesthesia Plan  ASA: III  Anesthesia Plan: General   Post-op Pain Management:    Induction: Intravenous  PONV Risk Score and Plan: 3 and Ondansetron, Dexamethasone and Treatment may vary due to age or medical condition  Airway Management Planned: Oral ETT  Additional Equipment:   Intra-op Plan:   Post-operative Plan: Extubation in OR  Informed Consent: I have reviewed the patients History and Physical, chart, labs and discussed the procedure including the risks, benefits and alternatives for the proposed anesthesia with the patient or authorized representative who has indicated his/her understanding and acceptance.   Dental advisory given  Plan Discussed with: CRNA  Anesthesia Plan Comments:         Anesthesia Quick Evaluation

## 2017-04-19 NOTE — Discharge Instructions (Signed)

## 2017-04-19 NOTE — ED Notes (Signed)
Dr. Mackuen at bedside at this time.  

## 2017-04-19 NOTE — ED Triage Notes (Signed)
From home- fall on Friday and now has pain to right thigh that is worse with ambulation. Pt was in wheelchair at home when EMS arrived. Denies hitting head. PT reports losing balance when turning that caused fall.   112/78 Hr 39 spo2 99%

## 2017-04-19 NOTE — ED Notes (Signed)
Report called to Short Stay - Chad Russell. Patient taken to Short Stay #[id number].

## 2017-04-19 NOTE — ED Provider Notes (Signed)
[facility] EMERGENCY DEPARTMENT Provider Note   CSN: [id number] Arrival date & time: 04/19/17  1217     History   Chief Complaint Chief Complaint  Patient presents with  . Fall    HPI Chad Russell is a 83 y.o. male.  HPI   Patient is an 84 year old male presenting with fall on Friday.  3 days ago.  Patient has pain to the right hip.  Patientmedical history significant for A. fib and bradycardia heart rate is baseline in the 30s.    Past Medical History:  Diagnosis Date  . Arthritis   . Atrial fibrillation ([place])   . Balance problem 10/31/2013  . BPH (benign prostatic hyperplasia) 2014   Urology Dr [name] with [facility] [phone]   . Bradycardia    a. baseline HR in the 30's. Asymptomatic - no history of PPM placement.   Marland Kitchen CAD (coronary artery disease)    LHC 03/16/11: Mid LAD 10-20%, proximal circumflex 10%, mid RCA 20%, EF 55%.  . Cataract   . Complete heart block ([place])   . GERD (gastroesophageal reflux disease)    takes occasional  zantac  . Hx of echocardiogram    Echo 02/2011: EF 65-70%, normal wall motion, MAC, mild MR, mild LAE, mild RVE  . Hypertension   . OSA (obstructive sleep apnea)    per patient went to have sleep study done about 2 years ago, bu they never F/U with him about results; but wife reports he still has periods of apnea at night   . Talar fracture    casted no surg    Patient Active Problem List   Diagnosis Date Noted  . MGUS (monoclonal gammopathy of unknown significance) 01/15/2017  . Chest congestion 11/30/2016  . Cough 11/30/2016  . Acute bronchitis 11/30/2016  . BPH with obstruction/lower urinary tract symptoms 09/07/2016  . Preoperative cardiovascular examination 08/24/2016  . Dysuria 05/13/2016  . Urinary retention 05/13/2016  . Nausea without vomiting 05/13/2016  . Pelvic pain 05/13/2016  . Balance problem 10/31/2013  . OSA (obstructive sleep apnea) 10/31/2013  . BPH (benign prostatic hyperplasia) 08/28/2012   . Atrial fibrillation ([place]) 06/08/2012  . Bacteremia 04/28/2012  . Hyperplasia of prostate with lower urinary tract symptoms (LUTS) 04/28/2012  . Calf pain 04/28/2012  . Moderate malnutrition ([place]) 04/28/2012  . Influenza-like illness 04/27/2012  . Infection of urinary tract - recurrent 04/26/2012  . NSTEMI (non-ST elevated myocardial infarction) ([place]) 03/15/2011  . Complete heart block ([place]) 03/15/2011  . HTN (hypertension) 03/15/2011    Past Surgical History:  Procedure Laterality Date  . ABDOMINAL AORTOGRAM W/LOWER EXTREMITY N/A 01/17/2017   Procedure: ABDOMINAL AORTOGRAM W/LOWER EXTREMITY;  Surgeon: Angelia Mould, MD;  Location: [facility];  Service: Cardiovascular;  Laterality: N/A;  . COLONOSCOPY     Repeated and normal in 2011  . hemmorhoidectomy    . INGUINAL HERNIA REPAIR Right 02/26/2014   Procedure: LAPAROSCOPIC RIGHT INGUINAL HERNIA REPAIR;  Surgeon: Ralene Ok, MD;  Location: [facility];  Service: General;  Laterality: Right;  . INSERTION OF MESH Right 02/26/2014   Procedure: INSERTION OF MESH;  Surgeon: Ralene Ok, MD;  Location: [facility];  Service: General;  Laterality: Right;  . LEFT HEART CATHETERIZATION WITH CORONARY ANGIOGRAM N/A 03/16/2011   Procedure: LEFT HEART CATHETERIZATION WITH CORONARY ANGIOGRAM;  Surgeon: Peter M Martinique, MD;  Location: [facility] CATH [facility];  Service: Cardiovascular;  Laterality: N/A;  . PROSTATE BIOPSY     negative - [facility]  .  TRANSURETHRAL RESECTION OF PROSTATE N/A 09/07/2016   Procedure: TRANSURETHRAL RESECTION OF THE PROSTATE (TURP);  Surgeon: Irine Seal, MD;  Location: [facility];  Service: Urology;  Laterality: N/A;       Home Medications    Prior to Admission medications   Medication Sig Start Date End Date Taking? Authorizing Provider  albuterol (PROVENTIL HFA;VENTOLIN HFA) 108 (90 Base) MCG/ACT inhaler Inhale 1 puff into the lungs every 4 (four) hours as needed for wheezing or shortness of breath. 03/17/17    [name], MD  benzonatate (TESSALON) 200 MG capsule Take 1 capsule (200 mg total) by mouth 3 (three) times daily as needed for cough. 03/12/17   Shawnee Knapp, MD  carbidopa-levodopa (SINEMET IR) 25-100 MG tablet Take 1 tablet by mouth 3 (three) times daily. 02/25/17   Narda Amber K, DO  cefdinir (OMNICEF) 300 MG capsule Take 1 capsule (300 mg total) by mouth 2 (two) times daily. 03/10/17   [name], MD  docusate sodium (COLACE) 100 MG capsule Take 100 mg at bedtime as needed by mouth for mild constipation.    [provider]  Guaifenesin (MUCINEX MAXIMUM STRENGTH) 1200 MG TB12 Take 1 tablet (1,200 mg total) by mouth every 12 (twelve) hours as needed. 03/12/17   Shawnee Knapp, MD  hydrocortisone cream 1 % Apply 1 application daily as needed topically for itching.    [provider]  lisinopril-hydrochlorothiazide (PRINZIDE,ZESTORETIC) 10-12.5 MG tablet Take 1 tablet by mouth daily. 12/20/16   Minus Breeding, MD  LUTEIN-ZEAXANTHIN PO Take 1 tablet daily by mouth.     [provider]  MAGNESIUM CITRATE PO Take 250 mg daily by mouth.    [provider]  Misc Natural Products (PROSTATE) CAPS Take 1 tablet daily by mouth. Prostate Plus otc supplement    [provider]  Multiple Vitamin (MULTIVITAMIN WITH MINERALS) TABS tablet Take 1 tablet daily by mouth.    [provider]  Multiple Vitamins-Minerals (PRESERVISION AREDS 2+MULTI VIT PO) Take 1 tablet by mouth 2 (two) times daily.    [provider]  OVER THE COUNTER MEDICATION Take 1 tablet 2 (two) times daily by mouth. Super beta prostate otc supplement    [provider]  Probiotic Product (PROBIOTIC DAILY PO) Take 1 tablet by mouth daily.    [provider]  ranitidine (ZANTAC) 150 MG tablet Take 150 mg daily as needed by mouth for heartburn.    [provider]  timolol (TIMOPTIC) 0.5 % ophthalmic solution Place 1 drop into both eyes at bedtime.   12/04/15   [provider]  vitamin C (ASCORBIC ACID) 500 MG tablet Take 500 mg by mouth daily.    [provider]  XARELTO 20 MG TABS tablet TAKE ONE TABLET BY MOUTH DAILY WITH SUPPER 04/15/17   Minus Breeding, MD    Family History Family History  Problem Relation Age of Onset  . Hypertension Mother   . Multiple sclerosis Sister     Social History Social History   Tobacco Use  . Smoking status: Former Smoker    Packs/day: 1.00    Years: 3.00    Pack years: 3.00    Types: Cigarettes    Last attempt to quit: 03/17/1955    Years since quitting: 62.1  . Smokeless tobacco: Former Network engineer Use Topics  . Alcohol use: Yes    Alcohol/week: 8.4 oz    Types: 7 Cans of beer, 7 Standard drinks or equivalent per week  Comment: daily  . Drug use: No     Allergies   Doxycycline and Keflex [cephalexin]   Review of Systems Review of Systems  Constitutional: Negative for activity change.  Respiratory: Negative for shortness of breath.   Cardiovascular: Negative for chest pain.  Gastrointestinal: Negative for abdominal pain.     Physical Exam Updated Vital Signs BP 123/67 (BP Location: Right Arm)   Pulse (!) 38   Temp (!) 97.5 F (36.4 C) (Oral)   Resp 16   Ht _0  (1.803 m)   Wt 76.2 kg (168 lb)   SpO2 99%   BMI 23.43 kg/m   Physical Exam  Constitutional: He is oriented to person, place, and time. He appears well-developed and well-nourished.  HENT:  Head: Normocephalic and atraumatic.  Eyes: Conjunctivae are normal.  Neck: Neck supple.  Cardiovascular: Normal rate and regular rhythm.  No murmur heard. Pulmonary/Chest: Effort normal and breath sounds normal. No respiratory distress.  Abdominal: Soft. There is no tenderness.  Musculoskeletal: He exhibits no edema.  Pain to the right hip.  Pain with internal and external rotation.   Neurological: He is alert and oriented to person, place, and time. No cranial nerve deficit.  Skin: Skin  is warm and dry.  Psychiatric: He has a normal mood and affect.  Nursing note and vitals reviewed.    ED Treatments / Results  Labs (all labs ordered are listed, but only abnormal results are displayed) Labs Reviewed  BASIC METABOLIC PANEL - Abnormal; Notable for the following components:      Result Value   Glucose, Bld 106 (*)    BUN 26 (*)    All other components within normal limits  CBC - Abnormal; Notable for the following components:   RBC 3.99 (*)    Hemoglobin 12.1 (*)    HCT 37.6 (*)    RDW 15.9 (*)    All other components within normal limits  URINALYSIS, ROUTINE W REFLEX MICROSCOPIC  CBG MONITORING, ED    EKG  EKG Interpretation  Date/Time:  Tuesday April 19 2017 12:20:26 EST Ventricular Rate:  38 PR Interval:    QRS Duration: 106 QT Interval:  524 QTC Calculation: 416 R Axis:   27 Text Interpretation:  Atrial fibrillation Confirmed by Thomasene Lot, [place] [phone]) on 04/19/2017 2:25:17 PM Also confirmed by Zenovia Jarred [phone])  on 04/19/2017 2:57:51 PM       Radiology Dg Chest 1 View  Result Date: 04/19/2017 CLINICAL DATA:  Pain following fall EXAM: CHEST 1 VIEW COMPARISON:  March 10, 2017 FINDINGS: There are areas of pleural plaque and calcification consistent with prior asbestos exposure. There is no appreciable edema or consolidation. Heart is mildly enlarged with pulmonary vascularity within normal limits. There is aortic atherosclerosis. No pneumothorax. No adenopathy. There is superior migration of the right humeral head. No acute fracture. IMPRESSION: Areas of pleural plaque and diaphragmatic/pleural calcification consistent with prior asbestos exposure. No edema or consolidation. Mild cardiomegaly. Aortic atherosclerosis. Superior migration of the right humeral head, a finding indicative of chronic rotator cuff tear. Aortic Atherosclerosis (ICD10-I70.0). Electronically Signed   By: Lowella Grip III M.D.   On: 04/19/2017 14:28   Dg Pelvis  1-2 Views  Result Date: 04/19/2017 CLINICAL DATA:  Fall with right femur and hip pain EXAM: PELVIS - 1-2 VIEW COMPARISON:  12/30/2016, 01/12/2012 FINDINGS: Pubic symphysis is intact. The left femoral head projects in joint. SI joints are non widened. Acute mildly displaced right femoral neck fracture. Mild degenerative changes of  the hips. Vascular calcification IMPRESSION: 1. Acute mildly displaced right femoral neck fracture 2. Mild arthritis of the bilateral hips Electronically Signed   By: Donavan Foil M.D.   On: 04/19/2017 14:11   Dg Femur Min 2 Views Right  Result Date: 04/19/2017 CLINICAL DATA:  Fall with leg pain EXAM: RIGHT FEMUR 2 VIEWS COMPARISON:  04/19/2017 FINDINGS: Right pubic rami are intact. Right femoral head projects in joint. Acute right femoral neck fracture with cephalad migration of the trochanter. Extensive vascular calcifications. Patellofemoral degenerative changes. No dislocation evident. IMPRESSION: Acute, mildly displaced right femoral neck fracture Electronically Signed   By: Donavan Foil M.D.   On: 04/19/2017 14:13    Procedures Procedures (including critical care time)  Medications Ordered in ED Medications - No data to display   Initial Impression / Assessment and Plan / ED Course  I have reviewed the triage vital signs and the nursing notes.  Pertinent labs & imaging results that were available during my care of the patient were reviewed by me and considered in my medical decision making (see chart for details).     Patient is an 84 year old male presenting with fall on Friday.  3 days ago.  Patient has pain to the right hip.  Patientmedical history significant for A. fib and bradycardia heart rate is baseline in the 30s.  3:17 PM Obvious deformity on physical exam.  Fracture on x-ray.  Discussed with Micheal from ortho.   3:17 PM Will admit to medicine.     Final Clinical Impressions(s) / ED Diagnoses   Final diagnoses:  Fall    ED  Discharge Orders    None       Macarthur Critchley, MD 04/19/17 [phone]

## 2017-04-19 NOTE — Transfer of Care (Signed)
Immediate Anesthesia Transfer of Care Note  Patient: Chad Russell  Procedure(s) Performed: ARTHROPLASTY BIPOLAR HIP (HEMIARTHROPLASTY) (Right Hip)  Patient Location: PACU  Anesthesia Type:General  Level of Consciousness: awake and alert   Airway & Oxygen Therapy: Patient Spontanous Breathing and Patient connected to nasal cannula oxygen  Post-op Assessment: Report given to RN, Post -op Vital signs reviewed and stable and Patient moving all extremities X 4  Post vital signs: Reviewed and stable  Last Vitals:  Vitals:   04/19/17 1700 04/19/17 1730  BP: (!) 146/68 (!) 156/87  Pulse: (!) 40 (!) 40  Resp: 13 18  Temp:    SpO2: 99% 97%    Last Pain:  Vitals:   04/19/17 1448  TempSrc:   PainSc: 2          Complications: No apparent anesthesia complications

## 2017-04-19 NOTE — Telephone Encounter (Signed)
Wife is calling back and states she is not sure how to get a ambulance out to his house to take him to get a xray. Patient wife would like a call from Dr. Carlota Raspberry on what she should do and which hospital to go too. Please advise.   [phone]

## 2017-04-20 DIAGNOSIS — I482 Chronic atrial fibrillation: Secondary | ICD-10-CM

## 2017-04-20 DIAGNOSIS — I110 Hypertensive heart disease with heart failure: Secondary | ICD-10-CM

## 2017-04-20 DIAGNOSIS — S72001A Fracture of unspecified part of neck of right femur, initial encounter for closed fracture: Principal | ICD-10-CM

## 2017-04-20 DIAGNOSIS — W19XXXA Unspecified fall, initial encounter: Secondary | ICD-10-CM

## 2017-04-20 DIAGNOSIS — I5030 Unspecified diastolic (congestive) heart failure: Secondary | ICD-10-CM

## 2017-04-20 DIAGNOSIS — D472 Monoclonal gammopathy: Secondary | ICD-10-CM

## 2017-04-20 DIAGNOSIS — I878 Other specified disorders of veins: Secondary | ICD-10-CM

## 2017-04-20 DIAGNOSIS — I442 Atrioventricular block, complete: Secondary | ICD-10-CM

## 2017-04-20 DIAGNOSIS — Z96641 Presence of right artificial hip joint: Secondary | ICD-10-CM

## 2017-04-20 LAB — CBC
HCT: 33.5 % — ABNORMAL LOW (ref 39.0–52.0)
Hemoglobin: 11 g/dL — ABNORMAL LOW (ref 13.0–17.0)
MCH: 31.1 pg (ref 26.0–34.0)
MCHC: 32.8 g/dL (ref 30.0–36.0)
MCV: 94.6 fL (ref 78.0–100.0)
Platelets: 277 10*3/uL (ref 150–400)
RBC: 3.54 MIL/uL — AB (ref 4.22–5.81)
RDW: 15.9 % — ABNORMAL HIGH (ref 11.5–15.5)
WBC: 12.2 10*3/uL — ABNORMAL HIGH (ref 4.0–10.5)

## 2017-04-20 LAB — BASIC METABOLIC PANEL
Anion gap: 11 (ref 5–15)
BUN: 27 mg/dL — AB (ref 6–20)
CO2: 23 mmol/L (ref 22–32)
CREATININE: 0.97 mg/dL (ref 0.61–1.24)
Calcium: 8.7 mg/dL — ABNORMAL LOW (ref 8.9–10.3)
Chloride: 103 mmol/L (ref 101–111)
Glucose, Bld: 110 mg/dL — ABNORMAL HIGH (ref 65–99)
Potassium: 4.4 mmol/L (ref 3.5–5.1)
SODIUM: 137 mmol/L (ref 135–145)

## 2017-04-20 LAB — GLUCOSE, CAPILLARY
GLUCOSE-CAPILLARY: 115 mg/dL — AB (ref 65–99)
GLUCOSE-CAPILLARY: 103 mg/dL — AB (ref 65–99)

## 2017-04-20 NOTE — H&P (Signed)
Internal Medicine Attending Admission Note Date: 04/20/2017  Patient name: Chad Russell Medical record number: [id number] Date of birth: [birth year] Age: 84 y.o. Gender: male  I saw and evaluated the patient. I reviewed the resident's note and I agree with the resident's findings and plan as documented in the resident's note.  Chief Complaint(s): Right hip pain 3 days.  History - key components related to admission:  Chad Russell is an 84 year old man with a history of MGUS, complete heart block, atrial fibrillation, and hypertension who presents with worsening hip pain 3 days. He was in his usual state of health until 3 days prior to admission when he fell to the ground after turning. This was a mechanical fall and he denies any dizziness or loss of consciousness. Initially, he had some right thigh pain but was able to ambulate without much difficulty. On the day prior to admission the pain acutely worsened and any ambulation was very painful. He therefore presented to the emergency department where he was found to have a right femoral neck fracture that was mildly displaced. He was therefore admitted to the internal medicine teaching service and orthopedic surgery was consulted. He was taken to the operating room on the day of admission and underwent a right hip hemiarthroplasty.  When seen on rounds the morning after admission status post his right hemiarthroplasty he was without any complaints. He states his pain was well controlled at 2/10.  Physical Exam - key components related to admission:  Vitals:   04/19/17 2245 04/19/17 2314 04/20/17 0523 04/20/17 1311  BP: 133/60 (!) 129/57 (!) 103/56 (!) 113/51  Pulse: (!) 36 (!) 38 (!) 36 74  Resp: 16 15 15 15   Temp:  (!) 97 F (36.1 C) 97.9 F (36.6 C) (!) 97.5 F (36.4 C)  TempSrc:  Oral Oral Axillary  SpO2: 95% 94% 97% 90%  Weight:      Height:       Gen.: Well-developed, well-nourished, man sitting comfortably in a recliner in no  acute distress. He is alert and oriented 3. Right hip: Bandage clean and dry.  No ecchymoses over the peri-incisional area anteriorly.  Lab results:  Basic Metabolic Panel: Recent Labs    04/19/17 1230 04/20/17 0726  NA 138 137  K 4.1 4.4  CL 104 103  CO2 24 23  GLUCOSE 106* 110*  BUN 26* 27*  CREATININE 0.86 0.97  CALCIUM 9.1 8.7*   CBC: Recent Labs    04/19/17 1230 04/20/17 0726  WBC 10.4 12.2*  HGB 12.1* 11.0*  HCT 37.6* 33.5*  MCV 94.2 94.6  PLT 276 277   CBG: Recent Labs    04/19/17 1540 04/20/17 0643 04/20/17 1210  GLUCAP 77 115* 103*   Urinalysis:  Clear, yellow, specific gravity 1.025, pH 5.0, ketones 20, nitrite negative, leukocytes negative.  Imaging results:  Dg Chest 1 View  Result Date: 04/19/2017 CLINICAL DATA:  Pain following fall EXAM: CHEST 1 VIEW COMPARISON:  March 10, 2017 FINDINGS: There are areas of pleural plaque and calcification consistent with prior asbestos exposure. There is no appreciable edema or consolidation. Heart is mildly enlarged with pulmonary vascularity within normal limits. There is aortic atherosclerosis. No pneumothorax. No adenopathy. There is superior migration of the right humeral head. No acute fracture. IMPRESSION: Areas of pleural plaque and diaphragmatic/pleural calcification consistent with prior asbestos exposure. No edema or consolidation. Mild cardiomegaly. Aortic atherosclerosis. Superior migration of the right humeral head, a finding indicative of chronic rotator cuff tear. Aortic Atherosclerosis (  ICD10-I70.0). Electronically Signed   By: Lowella Grip III M.D.   On: 04/19/2017 14:28   Dg Pelvis 1-2 Views  Result Date: 04/19/2017 CLINICAL DATA:  Fall with right femur and hip pain EXAM: PELVIS - 1-2 VIEW COMPARISON:  12/30/2016, 01/12/2012 FINDINGS: Pubic symphysis is intact. The left femoral head projects in joint. SI joints are non widened. Acute mildly displaced right femoral neck fracture. Mild  degenerative changes of the hips. Vascular calcification IMPRESSION: 1. Acute mildly displaced right femoral neck fracture 2. Mild arthritis of the bilateral hips Electronically Signed   By: Donavan Foil M.D.   On: 04/19/2017 14:11   Dg Hip Port Unilat With Pelvis 1v Right  Result Date: 04/19/2017 CLINICAL DATA:  ORIF right hip EXAM: DG HIP (WITH OR WITHOUT PELVIS) 1V PORT RIGHT COMPARISON:  04/19/2017 FINDINGS: Changes of right hip replacement. No hardware bony complicating feature. Normal AP alignment. IMPRESSION: Right hip replacement.  No complicating feature. Electronically Signed   By: Rolm Baptise M.D.   On: 04/19/2017 22:03   Dg Femur Min 2 Views Right  Result Date: 04/19/2017 CLINICAL DATA:  Fall with leg pain EXAM: RIGHT FEMUR 2 VIEWS COMPARISON:  04/19/2017 FINDINGS: Right pubic rami are intact. Right femoral head projects in joint. Acute right femoral neck fracture with cephalad migration of the trochanter. Extensive vascular calcifications. Patellofemoral degenerative changes. No dislocation evident. IMPRESSION: Acute, mildly displaced right femoral neck fracture Electronically Signed   By: Donavan Foil M.D.   On: 04/19/2017 14:13   Pelvic x-ray: Personally reviewed. Mildly displaced acute right femoral neck fracture.  Right hip x-ray: Personally reviewed. Right femoral neck fracture.  AP portable chest x-ray: Personally reviewed. Rotated, calcified pleural plaques bilaterally. No significant change from the previous chest x-ray on 03/10/2017.  Other results:  EKG: Personally reviewed. Atrial fibrillation with junctional escape at 38 bpm, normal axis, normal intervals, no significant Q waves, no LVH by voltage, good R wave progression, no ST or T-wave changes. Unchanged from the previous ECG on 08/24/2016.  Assessment & Plan by Problem:  Chad Russell is an 84 year old man with a history of MGUS, complete heart block, atrial fibrillation, and hypertension who presents with worsening  hip pain 3 days. He was found to have an acute right femoral neck fracture and underwent a right hip hemiarthroplasty that was uneventful.  1) Status post right hip hemiarthroplasty: Currently undergoing physical therapy. A short term skilled nursing facility stay was recommended. We are working on placement. In the meantime, we will treat with appropriate DVT prophylaxis and pain control.  2) Disposition: Pending placement for further rehabilitation after his right hip hemiarthroplasty.

## 2017-04-20 NOTE — Evaluation (Signed)
Physical Therapy Evaluation Patient Details Name: Chad Russell MRN: [id number] DOB: 08-May-1926 Today's Date: 04/20/2017   History of Present Illness  84 y.o. male s/p R bipolar hip hemiarhroplasty 2/26 after fracture from a fall at home. PMH includes: CAD, A Fib, Bradycardia, Cataract     Clinical Impression  Patient is s/p above surgery resulting in functional limitations due to the deficits listed below (see PT Problem List). PTA, pt was independent and active exercising at [facility], living with wife in 2 story home with stairs to enter. Upon evaluation, pt presents with post op pain and weakness limiting his mobility. Currently mod A level for bed mobility and stand pivot transfer into bedside chair and unable to ambulate in room today. Patient presents as fall risk at current level of functioning and requires physical assistance with OOB mobility. Pt reports desire to go home with HHPT and support of wife who was not present today. Pt would benefit from short term SNF rehab, will reassess next visit and update recs if appropriate.   Patient will benefit from skilled PT to increase their independence and safety with mobility to allow discharge to the venue listed below.       Follow Up Recommendations SNF;Supervision/Assistance - 24 hour    Equipment Recommendations  Rolling walker with 5" wheels;3in1 (PT)    Recommendations for Other Services OT consult     Precautions / Restrictions Precautions Precautions: Posterior Hip Precaution Booklet Issued: Yes (comment) Precaution Comments: Verbal/written explaination of 3/3 precautions.  Restrictions Weight Bearing Restrictions: Yes RLE Weight Bearing: Weight bearing as tolerated      Mobility  Bed Mobility Overal bed mobility: Needs Assistance Bed Mobility: Supine to Sit     Supine to sit: Mod assist     General bed mobility comments: Mod A for bed mobility to assist surigical limb over EOB and raise trunk    Transfers Overall transfer level: Needs assistance   Transfers: Sit to/from Stand;Stand Pivot Transfers Sit to Stand: Mod assist Stand pivot transfers: Mod assist       General transfer comment: Mod A to power up into RW, cues for hand placement and hip precautions. extremly slow in movements and requiers mod A to remain balanced while transfering into bedside chair   Ambulation/Gait             General Gait Details: unable to this visit  Stairs            Wheelchair Mobility    Modified Rankin (Stroke Patients Only)       Balance Overall balance assessment: History of Falls;Needs assistance Sitting-balance support: Feet unsupported;No upper extremity supported Sitting balance-Leahy Scale: Fair     Standing balance support: During functional activity;Bilateral upper extremity supported Standing balance-Leahy Scale: Poor                               Pertinent Vitals/Pain Pain Assessment: 0-10 Pain Score: 1  Pain Location: R hip Pain Descriptors / Indicators: Aching;Discomfort;Grimacing;Guarding Pain Intervention(s): Limited activity within patient's tolerance;Monitored during session;Premedicated before session    Home Living Family/patient expects to be discharged to:: Private residence Living Arrangements: Spouse/significant other Available Help at Discharge: Family;Available 24 hours/day Type of Home: House Home Access: Stairs to enter   [employer] of Steps: 2 Home Layout: Two level;Able to live on main level with bedroom/bathroom Home Equipment: Walker - 4 wheels      Prior Function Level of Independence:  Independent         Comments: ambulating without AD, exercising the [employer]     Hand Dominance        Extremity/Trunk Assessment   Upper Extremity Assessment Upper Extremity Assessment: Overall WFL for tasks assessed(LLE strength 4-/5 )    Lower Extremity Assessment Lower Extremity Assessment: RLE  deficits/detail RLE Deficits / Details: weakness and limitations and ROM consistent with above procedure    Cervical / Trunk Assessment Cervical / Trunk Assessment: Kyphotic  Communication   Communication: No difficulties  Cognition Arousal/Alertness: Awake/alert Behavior During Therapy: WFL for tasks assessed/performed Overall Cognitive Status: Within Functional Limits for tasks assessed                                        General Comments General comments (skin integrity, edema, etc.): HR: 35-51 throughout session. Patient brady at baseline.     Exercises General Exercises - Lower Extremity Ankle Circles/Pumps: 20 reps Quad Sets: 10 reps Heel Slides: 10 reps Hip ABduction/ADduction: 10 reps   Assessment/Plan    PT Assessment Patient needs continued PT services  PT Problem List Decreased strength;Decreased range of motion;Decreased activity tolerance;Decreased balance;Decreased mobility;Decreased coordination;Pain       PT Treatment Interventions DME instruction;Gait training;Stair training;Functional mobility training;Therapeutic activities;Therapeutic exercise;Balance training    PT Goals (Current goals can be found in the Care Plan section)  Acute Rehab PT Goals Patient Stated Goal: return home PT Goal Formulation: With patient Time For Goal Achievement: 04/27/17 Potential to Achieve Goals: Fair    Frequency Min 3X/week   Barriers to discharge        Co-evaluation               AM-PAC PT "6 Clicks" Daily Activity  Outcome Measure Difficulty turning over in bed (including adjusting bedclothes, sheets and blankets)?: Unable Difficulty moving from lying on back to sitting on the side of the bed? : Unable Difficulty sitting down on and standing up from a chair with arms (e.g., wheelchair, bedside commode, etc,.)?: Unable Help needed moving to and from a bed to chair (including a wheelchair)?: A Lot Help needed walking in hospital room?: A  Lot Help needed climbing 3-5 steps with a railing? : Total 6 Click Score: 8    End of Session Equipment Utilized During Treatment: Gait belt Activity Tolerance: Patient tolerated treatment well Patient left: in chair;with call bell/phone within reach Nurse Communication: Mobility status PT Visit Diagnosis: Unsteadiness on feet (R26.81);Repeated falls (R29.6);Other abnormalities of gait and mobility (R26.89);Muscle weakness (generalized) (M62.81);History of falling (Z91.81);Pain Pain - Right/Left: Right Pain - part of body: Hip    Time: 0915-1010 PT Time Calculation (min) (ACUTE ONLY): 55 min   Charges:   PT Evaluation $PT Eval Low Complexity: 1 Low PT Treatments $Therapeutic Exercise: 8-22 mins $Therapeutic Activity: 8-22 mins   PT G Codes:       Reinaldo Berber, PT, DPT Acute Rehab Services Pager: [phone]    Reinaldo Berber 04/20/2017, 10:44 AM

## 2017-04-20 NOTE — Progress Notes (Signed)
Internal Medicine Attending  Date: 04/20/2017  Patient name: Chad Russell Medical record number: [id number] Date of birth: 02-10-1927 Age: 84 y.o. Gender: male  I saw and evaluated the patient. I reviewed the resident's note by Dr. Shan Levans and I agree with the resident's findings and plans as documented in his progress note.  Please see my H&P dated 04/20/2017 for the specifics of my evaluation, assessment, and plan from earlier in the day.

## 2017-04-20 NOTE — Progress Notes (Addendum)
Patient ID: Chad Russell, male   DOB: 07-24-1926, 84 y.o.   MRN: [id number]     Subjective:  Patient reports pain as mild.  Patient reports that he is much better  Objective:   VITALS:   Vitals:   04/19/17 2245 04/19/17 2314 04/20/17 0523 04/20/17 1311  BP: 133/60 (!) 129/57 (!) 103/56 (!) 113/51  Pulse: (!) 36 (!) 38 (!) 36 74  Resp: 16 15 15 15   Temp:  (!) 97 F (36.1 C) 97.9 F (36.6 C) (!) 97.5 F (36.4 C)  TempSrc:  Oral Oral Axillary  SpO2: 95% 94% 97% 90%  Weight:      Height:        ABD soft Sensation intact distally Dorsiflexion/Plantar flexion intact Incision: dressing C/D/I and scant drainage   Lab Results  Component Value Date   WBC 12.2 (H) 04/20/2017   HGB 11.0 (L) 04/20/2017   HCT 33.5 (L) 04/20/2017   MCV 94.6 04/20/2017   PLT 277 04/20/2017   BMET    Component Value Date/Time   NA 137 04/20/2017 0726   NA 140 03/10/2017 1816   NA 139 01/04/2017 1407   K 4.4 04/20/2017 0726   K 4.3 01/04/2017 1407   CL 103 04/20/2017 0726   CO2 23 04/20/2017 0726   CO2 26 01/04/2017 1407   GLUCOSE 110 (H) 04/20/2017 0726   GLUCOSE 99 01/04/2017 1407   BUN 27 (H) 04/20/2017 0726   BUN 31 (H) 03/10/2017 1816   BUN 20.7 01/04/2017 1407   CREATININE 0.97 04/20/2017 0726   CREATININE 1.2 01/04/2017 1407   CALCIUM 8.7 (L) 04/20/2017 0726   CALCIUM 9.8 01/04/2017 1407   GFRNONAA >60 04/20/2017 0726   GFRNONAA 77 12/11/2015 1502   GFRAA >60 04/20/2017 0726   GFRAA 88 12/11/2015 1502     Assessment/Plan: 1 Day Post-Op   Principal Problem:   Fracture of femoral neck, right (HCC) Active Problems:   Femur neck fracture (HCC)   Advance diet Up with therapy WBAT Dry dressing PRN Continue plan per medicine   Johnny Bridge 04/20/2017, 6:31 PM  Discussed and agree with above.    Marchia Bond, MD Cell [phone]

## 2017-04-20 NOTE — Plan of Care (Signed)
  Progressing Health Behavior/Discharge Planning: Ability to manage health-related needs will improve 04/20/2017 1110 - Progressing by Rance Muir, RN Clinical Measurements: Ability to maintain clinical measurements within normal limits will improve 04/20/2017 1110 - Progressing by Rance Muir, RN Will remain free from infection 04/20/2017 1110 - Progressing by Rance Muir, RN Diagnostic test results will improve 04/20/2017 1110 - Progressing by Rance Muir, RN Respiratory complications will improve 04/20/2017 1110 - Progressing by Rance Muir, RN Cardiovascular complication will be avoided 04/20/2017 1110 - Progressing by Rance Muir, RN Activity: Risk for activity intolerance will decrease 04/20/2017 1110 - Progressing by Rance Muir, RN Nutrition: Adequate nutrition will be maintained 04/20/2017 1110 - Progressing by Rance Muir, RN Coping: Level of anxiety will decrease 04/20/2017 1110 - Progressing by Rance Muir, RN Elimination: Will not experience complications related to bowel motility 04/20/2017 1110 - Progressing by Rance Muir, RN Will not experience complications related to urinary retention 04/20/2017 1110 - Progressing by Rance Muir, RN Pain Managment: General experience of comfort will improve 04/20/2017 1110 - Progressing by Rance Muir, RN Safety: Ability to remain free from injury will improve 04/20/2017 1110 - Progressing by Rance Muir, RN Skin Integrity: Risk for impaired skin integrity will decrease 04/20/2017 1110 - Progressing by Rance Muir, RN

## 2017-04-20 NOTE — Progress Notes (Signed)
Orthopedic Tech Progress Note Patient Details:  Chad Russell [birth year] [id number]  Patient ID: [name], male   DOB: 14-Mar-1926, 84 y.o.   MRN: [id number] Pt cant have ohf due to age restrictions  [name] 04/20/2017, 6:59 PM

## 2017-04-20 NOTE — Progress Notes (Signed)
   Subjective: Patient reports not being in much pain today rates it a 2 out of 10.  He has no nausea has not vomited.  We went over how to use his incentive spirometer, and spoke about the plan going forward.  Objective:  Vital signs in last 24 hours: Vitals:   04/19/17 2230 04/19/17 2245 04/19/17 2314 04/20/17 0523  BP: (!) 128/59 133/60 (!) 129/57 (!) 103/56  Pulse: (!) 41 (!) 36 (!) 38 (!) 36  Resp: 13 16 15 15   Temp: 98.1 F (36.7 C)  (!) 97 F (36.1 C) 97.9 F (36.6 C)  TempSrc:   Oral Oral  SpO2: 96% 95% 94% 97%  Weight:      Height:       Physical Exam  Constitutional: He is oriented to person, place, and time. No distress.  Eyes: Right eye exhibits no discharge. Left eye exhibits no discharge. No scleral icterus.  Neck: JVD  present.  Cardiovascular: Regular rhythm, normal heart sounds and intact distal pulses. Bradycardia present. Exam reveals no gallop and no friction rub.  No murmur heard. Pulmonary/Chest: Effort normal and breath sounds normal. No respiratory distress. He has no wheezes. He has no rales.  Abdominal: Soft. Bowel sounds are normal. He exhibits no distension and no mass. There is no tenderness. There is no guarding.  Musculoskeletal: He exhibits edema LE resolved with bed rest. R hip dressing clean and dry with no bleeding. Neurological: He is alert and oriented to person, place, and time.  Skin: He is not diaphoretic.    Assessment/Plan:  Principal Problem:   Fracture of femoral neck, right (HCC) Active Problems:   Femur neck fracture (HCC)  R femoral neck fracture  -pain well controlled today  -vitals stable, encouraged IS use -successful R hemiarthroplasty procedure completed 04/19/17 -evaluated by PT this morning recommend SNF rehab on discharge   Permanent Afib Complete Heart Block   -not on rate control medications, baseline rhythm is junctional high 30s to 40 due to complete heart block -continuous cardiac monitoring -hold xarelto  for now  HFpEF PAH  -84 years old, maintaining volume status pretty well on his own only mildly volume up -will consider a PRN lasix prescription on discharge for increased edema, no indication acutely    Dispo: Anticipated discharge to rehab in 1-2 days.   Katherine Roan, MD 04/20/2017, 11:00 AM  Vickki Muff MD PGY-1 Internal Medicine Pager # [phone]

## 2017-04-21 ENCOUNTER — Other Ambulatory Visit: Payer: Self-pay

## 2017-04-21 ENCOUNTER — Encounter (HOSPITAL_COMMUNITY): Payer: Self-pay | Admitting: General Practice

## 2017-04-21 LAB — CBC
HCT: 31.7 % — ABNORMAL LOW (ref 39.0–52.0)
Hemoglobin: 10.2 g/dL — ABNORMAL LOW (ref 13.0–17.0)
MCH: 30.4 pg (ref 26.0–34.0)
MCHC: 32.2 g/dL (ref 30.0–36.0)
MCV: 94.3 fL (ref 78.0–100.0)
PLATELETS: 287 10*3/uL (ref 150–400)
RBC: 3.36 MIL/uL — AB (ref 4.22–5.81)
RDW: 16.2 % — ABNORMAL HIGH (ref 11.5–15.5)
WBC: 11.5 10*3/uL — AB (ref 4.0–10.5)

## 2017-04-21 LAB — BASIC METABOLIC PANEL
ANION GAP: 11 (ref 5–15)
BUN: 29 mg/dL — ABNORMAL HIGH (ref 6–20)
CO2: 23 mmol/L (ref 22–32)
Calcium: 8.7 mg/dL — ABNORMAL LOW (ref 8.9–10.3)
Chloride: 104 mmol/L (ref 101–111)
Creatinine, Ser: 0.89 mg/dL (ref 0.61–1.24)
GFR calc Af Amer: >60 mL/min (ref >60)
Glucose, Bld: 108 mg/dL — ABNORMAL HIGH (ref 65–99)
POTASSIUM: 4 mmol/L (ref 3.5–5.1)
SODIUM: 138 mmol/L (ref 135–145)

## 2017-04-21 LAB — GLUCOSE, CAPILLARY: GLUCOSE-CAPILLARY: 98 mg/dL (ref 65–99)

## 2017-04-21 MED ORDER — RIVAROXABAN 20 MG PO TABS
20.0000 mg | ORAL_TABLET | Freq: Every day | ORAL | Status: DC
  Administered 2017-04-21 – 2017-04-22 (×2): 20 mg via ORAL
  Filled 2017-04-21 (×2): qty 1

## 2017-04-21 NOTE — Progress Notes (Signed)
Internal Medicine Attending  Date: 04/21/2017  Patient name: Chad Russell Medical record number: [id number] Date of birth: [birth year] Age: 84 y.o. Gender: male  I saw and evaluated the patient. I reviewed the resident's note by Dr. Shan Levans and I agree with the resident's findings and plans as documented in his progress note.  Chad Russell has decided to go to short-term skilled nursing facility for rehabilitation status post right hip hemiarthroplasty. At this point finding an appropriate rehabilitation center with an open bed is a work in progress. We will work on untethering him from all of his unnecessary wiring, specifically his oxygen and telemetry.

## 2017-04-21 NOTE — Clinical Social Work Note (Signed)
Clinical Social Work Assessment  Patient Details  Name: Chad Russell MRN: [id number] Date of Birth: 1927/01/02  Date of referral:  04/21/17               Reason for consult:  Facility Placement                Permission sought to share information with:  Facility Art therapist granted to share information::     Name::     Chad Russell  Agency::     Relationship::  Spouse  Contact Information:     Housing/Transportation Living arrangements for the past 2 months:  [place] of Information:  Patient Patient Interpreter Needed:  None Criminal Activity/Legal Involvement Pertinent to Current Situation/Hospitalization:  No - Comment as needed Significant Relationships:  Spouse, Other Family Members Lives with:  Spouse Do you feel safe going back to the place where you live?  Yes Need for family participation in patient care:  Yes (Comment)  Care giving concerns:  Pt has new impairment and clinical staff is recommendation SNF. Pt indicated that he is from home with spouse. He resides in 2 floor home, and first floor has tile. He indicated that he could stay on the first floor given his impairment. Pt DECLINED to go to short term rehab at a facility and prefers to return home. CSW discussed and validated his desires. CSW further discussed physical limitations and patient understood and desired to return home. CSW obtained permission to speak with spouse. CSW inquired if patient has used home health agency and patient denied. CSW will f/u with RNCM to assist with home needs.  Social Worker assessment / plan:  Since no spouse at bedside. CSW will f/u and defer to [facility] for additional assistance.  Employment status:  Retired Nurse, adult PT Recommendations:  [place] / Referral to community resources:  Other (Comment Required)(declines SNF)  Patient/Family's Response to care:  Patient thanked CSW  for meeting with him and discussing disposition plan.  Patient/Family's Understanding of and Emotional Response to Diagnosis, Current Treatment, and Prognosis:  Patient understands his current physical limitations and barrier and may be premature in thinking he can manage at home. CSW explained home vs. SNF and the support needed. Pt voiced understanding and still desires to go to SNF. CSW will f/u with RNCM to assist with home needs. No other issues identified at this time.  Emotional Assessment Appearance:  Appears stated age Attitude/Demeanor/Rapport:  (Cooperative) Affect (typically observed):  Accepting, Appropriate Orientation:  Oriented to Situation, Oriented to  Time, Oriented to Place, Oriented to Self Alcohol / Substance use:  Not Applicable Psych involvement (Current and /or in the community):  No (Comment)  Discharge Needs  Concerns to be addressed:  No discharge needs identified Readmission within the last 30 days:  No Current discharge risk:  Dependent with Mobility, Physical Impairment Barriers to Discharge:  No Barriers Identified   Normajean Baxter, LCSW 04/21/2017, 10:13 AM

## 2017-04-21 NOTE — Progress Notes (Signed)
   Subjective: Patient reports not being in any pain today.  He is using his incentive spirometer as instructed.  He is not nauseous,nor has he vomited.  He has not had a bowel movement but feels like he will have one when I depart.    Objective:  Vital signs in last 24 hours: Vitals:   04/20/17 0523 04/20/17 1311 04/20/17 2204 04/21/17 0708  BP: (!) 103/56 (!) 113/51 (!) 111/56 (!) 120/58  Pulse: (!) 36 74 (!) 45 (!) 44  Resp: 15 15 16 18   Temp: 97.9 F (36.6 C) (!) 97.5 F (36.4 C) (!) 97.4 F (36.3 C) 98.3 F (36.8 C)  TempSrc: Oral Axillary Oral Oral  SpO2: 97% 90% 98% 98%  Weight:      Height:       Physical Exam  Constitutional: He is oriented to person, place, and time. No distress.  Eyes: Right eye exhibits no discharge. Left eye exhibits no discharge. No scleral icterus.  Neck: JVD  present.  Cardiovascular: Regular rhythm, normal heart sounds and intact distal pulses. Bradycardia present. Exam reveals no gallop and no friction rub.  No murmur heard. Pulmonary/Chest: Effort normal and breath sounds normal. No respiratory distress. He has no wheezes. He has no rales.  Abdominal: Soft. Bowel sounds are normal. He exhibits no distension and no mass. There is no tenderness. There is no guarding.  Musculoskeletal: He exhibits edema LE resolved with bed rest. R hip dressing clean and dry with no bleeding. Neurological: He is alert and oriented to person, place, and time.  Skin: He is not diaphoretic.    Assessment/Plan:  Principal Problem:   Fracture of femoral neck, right (HCC) Active Problems:   Femur neck fracture (HCC)  R femoral neck fracture  -pain well controlled today  -vitals stable, using IS -successful R hemiarthroplasty procedure completed 04/19/17 -continued PT OT today, will be discharged to rehab tomorrow -strong bowel regimen in place, reports movement and flatus but no BM   Permanent Afib Complete Heart Block   -not on rate control medications,  baseline rhythm is junctional high 30s to 40 due to complete heart block -continuous cardiac monitoring -will restart xarelto today  HFpEF PAH  -84 years old, maintaining volume status pretty well on his own only mildly volume up -will consider a PRN lasix prescription on discharge for increased edema, no indication acutely    Dispo: Anticipated discharge to rehab tomorrow morning   Katherine Roan, MD 04/21/2017, 11:10 AM  Vickki Muff MD PGY-1 Internal Medicine Pager # [phone]

## 2017-04-21 NOTE — Plan of Care (Signed)
  Progressing Health Behavior/Discharge Planning: Ability to manage health-related needs will improve 04/21/2017 1020 - Progressing by Rance Muir, RN Clinical Measurements: Ability to maintain clinical measurements within normal limits will improve 04/21/2017 1020 - Progressing by Rance Muir, RN Will remain free from infection 04/21/2017 1020 - Progressing by Rance Muir, RN Diagnostic test results will improve 04/21/2017 1020 - Progressing by Rance Muir, RN Respiratory complications will improve 04/21/2017 1020 - Progressing by Rance Muir, RN Cardiovascular complication will be avoided 04/21/2017 1020 - Progressing by Rance Muir, RN Activity: Risk for activity intolerance will decrease 04/21/2017 1020 - Progressing by Rance Muir, RN Nutrition: Adequate nutrition will be maintained 04/21/2017 1020 - Progressing by Rance Muir, RN Coping: Level of anxiety will decrease 04/21/2017 1020 - Progressing by Rance Muir, RN Elimination: Will not experience complications related to bowel motility 04/21/2017 1020 - Progressing by Rance Muir, RN Will not experience complications related to urinary retention 04/21/2017 1020 - Progressing by Rance Muir, RN Pain Managment: General experience of comfort will improve 04/21/2017 1020 - Progressing by Rance Muir, RN Safety: Ability to remain free from injury will improve 04/21/2017 1020 - Progressing by Rance Muir, RN Skin Integrity: Risk for impaired skin integrity will decrease 04/21/2017 1020 - Progressing by Rance Muir, RN

## 2017-04-21 NOTE — Social Work (Addendum)
CSW contacted spouse and unable to reach. CSW left message to discuss the disposition as patient desires to return home and declined SNF.  CSW advised RNCM of same.  CSW will f/u.  10:40am: Spouse returned call to [place]. CSW discussed her role and the patient's desires to return home. Spouse is in agreement with patient returning home. CSW indicated that RNCM will f/u to assist with home needs. Spouse in agreement.  CSW will sign off for now as social work intervention is no longer needed. Please consult [place] again if new need arises.  Elissa Hefty, LCSW Clinical Social Worker [phone]   Elissa Hefty, [place] Clinical Social Worker [phone]

## 2017-04-21 NOTE — Evaluation (Signed)
Occupational Therapy Evaluation Patient Details Name: Chad Russell MRN: [id number] DOB: 1926-03-20 Today's Date: 04/21/2017    History of Present Illness 84 y.o. male s/p R bipolar hip hemiarhroplasty 2/26 after fracture from a fall at home. PMH includes: CAD, A Fib, Bradycardia, Cataract   Clinical Impression   Pt with decline in function and safety with ADLs and ADL mobility with decreased strength, balance and endurance. Pt requires extensive assist with ADLs and mod A with mobility. Pt's wife adamant about pt returning home after acute d/c with [facility] therapy and no other assist besides herself. OT reiterated to pt that OT and PT still recommending SNF therapy before returning home. OT educated pt's wif eon bed mobility and SPT transfer to [facility] and pt's wife unable to safely assist pt with these tasks. Pt and wife now open to short term SNF and SW was notified. Pt would benefit form acute OT services to address impairments to maximize level of function and safety    Follow Up Recommendations  SNF    Equipment Recommendations  None recommended by OT;Other (comment)(TBD at next venue of care)    Recommendations for Other Services       Precautions / Restrictions Precautions Precautions: Posterior Hip Precaution Comments: reviewed hip precautions Restrictions Weight Bearing Restrictions: Yes RLE Weight Bearing: Weight bearing as tolerated      Mobility Bed Mobility Overal bed mobility: Needs Assistance Bed Mobility: Supine to Sit     Supine to sit: Mod assist     General bed mobility comments: Mod A for bed mobility to assist surigical limb over EOB and raise trunk. Pt's wife unable to safely assist pt with bed mobility   Transfers Overall transfer level: Needs assistance Equipment used: Rolling walker (2 wheeled) Transfers: Sit to/from [facility] Sit to Stand: Max assist Stand pivot transfers: Mod assist;+2 physical assistance;+2 safety/equipment        General transfer comment: Mod A to power up into RW, cues for hand placement and hip precautions. extremly slow in movements and requiers mod A to remain balanced while transfering into bedside chair     Balance                                           ADL either performed or assessed with clinical judgement   ADL Overall ADL's : Needs assistance/impaired Eating/Feeding: Set up;Supervision/ safety;Sitting   Grooming: Sitting;Min guard Grooming Details (indicate cue type and reason): Poor sitting balance Upper Body Bathing: Moderate assistance;Sitting Upper Body Bathing Details (indicate cue type and reason): Poor sitting balance Lower Body Bathing: Total assistance   Upper Body Dressing : Moderate assistance;Sitting Upper Body Dressing Details (indicate cue type and reason): Poor sitting balance Lower Body Dressing: Total assistance   Toilet Transfer: Moderate assistance;+2 for physical assistance;+2 for safety/equipment;BSC;Stand-pivot;RW;Cueing for safety;Cueing for sequencing Toilet Transfer Details (indicate cue type and reason): educated and practiced with pt's wife (very difficult for her to safely assist him) Toileting- Water quality scientist and Hygiene: Total assistance       Functional mobility during ADLs: Moderate assistance;+2 for physical assistance;+2 for safety/equipment;Cueing for safety;Cueing for sequencing;Rolling walker General ADL Comments: pt wife initially adamanet about pt returning home with only HH and her assisting him. Pt's wife unable to safely assist pt with bed mobility or SPT transfer to [facility]/History: Wears glasses Patient Visual  Report: No change from baseline       Perception     Praxis      Pertinent Vitals/Pain Pain Assessment: Faces Faces Pain Scale: Hurts little more Pain Location: R hip Pain Descriptors / Indicators: Aching;Discomfort;Grimacing;Guarding Pain Intervention(s): Limited  activity within patient's tolerance;Monitored during session;Premedicated before session     Hand Dominance Right   Extremity/Trunk Assessment Upper Extremity Assessment Upper Extremity Assessment: Generalized weakness   Lower Extremity Assessment Lower Extremity Assessment: Defer to PT evaluation   Cervical / Trunk Assessment Cervical / Trunk Assessment: Kyphotic   Communication Communication Communication: No difficulties   Cognition Arousal/Alertness: Awake/alert Behavior During Therapy: WFL for tasks assessed/performed Overall Cognitive Status: Within Functional Limits for tasks assessed                                     General Comments       Exercises     Shoulder Instructions      Home Living Family/patient expects to be discharged to:: Private residence Living Arrangements: Spouse/significant other Available Help at Discharge: Family;Available 24 hours/day Type of Home: House Home Access: Stairs to enter [employer] of Steps: 2   Home Layout: Two level;Able to live on main level with bedroom/bathroom     Bathroom Shower/Tub: Teacher, early years/pre: Standard     Home Equipment: Environmental consultant - 4 wheels          Prior Functioning/Environment Level of Independence: Independent        Comments: ambulating without AD, exercising the [employer]        OT Problem List: Decreased strength;Decreased activity tolerance;Decreased knowledge of use of DME or AE;Pain;Decreased knowledge of precautions;Decreased coordination;Impaired balance (sitting and/or standing)      OT Treatment/Interventions: Self-care/ADL training;DME and/or AE instruction;Therapeutic activities;Therapeutic exercise;Patient/family education;Neuromuscular education    OT Goals(Current goals can be found in the care plan section) Acute Rehab OT Goals Patient Stated Goal: return home OT Goal Formulation: With patient/family Time For Goal Achievement:  05/05/17 Potential to Achieve Goals: Good ADL Goals Pt Will Perform Grooming: with supervision;with set-up;sitting Pt Will Perform Upper Body Bathing: with min assist;with min guard assist;sitting Pt Will Perform Lower Body Bathing: with max assist;with mod assist;sitting/lateral leans Pt Will Perform Upper Body Dressing: with min assist;with min guard assist;sitting Pt Will Transfer to Toilet: with min assist;bedside commode Additional ADL Goal #1: Pt will complete bed mobility with min A to sit EOB in prep for ADLs  OT Frequency: Min 2X/week   Barriers to D/C: Decreased caregiver support          Co-evaluation              AM-PAC PT "6 Clicks" Daily Activity     Outcome Measure Help from another person eating meals?: None Help from another person taking care of personal grooming?: A Little Help from another person toileting, which includes using toliet, bedpan, or urinal?: Total Help from another person bathing (including washing, rinsing, drying)?: Total Help from another person to put on and taking off regular upper body clothing?: A Lot Help from another person to put on and taking off regular lower body clothing?: Total 6 Click Score: 12   End of Session Equipment Utilized During Treatment: Gait belt;Rolling walker;Other (comment)(BSC)  Activity Tolerance: Patient limited by fatigue Patient left: Other (comment)(on BSC with NT and wife in rooom)  OT Visit Diagnosis: Unsteadiness on feet (R26.81);History  of falling (Z91.81);Muscle weakness (generalized) (M62.81);Pain Pain - Right/Left: Right Pain - part of body: Hip;Leg                Time: 1321-1401 OT Time Calculation (min): 40 min Charges:  OT General Charges $OT Visit: 1 Visit OT Evaluation $OT Eval Low Complexity: 1 Low OT Treatments $Self Care/Home Management : 8-22 mins $Therapeutic Activity: 8-22 mins G-Codes: OT G-codes **NOT FOR INPATIENT CLASS** Functional Assessment Tool Used: AM-PAC 6 Clicks Daily  Activity     Britt Bottom 04/21/2017, 2:23 PM

## 2017-04-21 NOTE — Progress Notes (Addendum)
Patient ID: EXCELL NEYLAND, male   DOB: 25-Oct-1926, 84 y.o.   MRN: [id number]     Subjective:  Patient reports pain as mild to moderate.  Patient in bed and in no acute distress aware of time and place.  Objective:   VITALS:   Vitals:   04/19/17 2314 04/20/17 0523 04/20/17 1311 04/20/17 2204  BP: (!) 129/57 (!) 103/56 (!) 113/51 (!) 111/56  Pulse: (!) 38 (!) 36 74 (!) 45  Resp: 15 15 15 16   Temp: (!) 97 F (36.1 C) 97.9 F (36.6 C) (!) 97.5 F (36.4 C) (!) 97.4 F (36.3 C)  TempSrc: Oral Oral Axillary Oral  SpO2: 94% 97% 90% 98%  Weight:      Height:        ABD soft Sensation intact distally Dorsiflexion/Plantar flexion intact Incision: dressing C/D/I and no drainage   Lab Results  Component Value Date   WBC 12.2 (H) 04/20/2017   HGB 11.0 (L) 04/20/2017   HCT 33.5 (L) 04/20/2017   MCV 94.6 04/20/2017   PLT 277 04/20/2017   BMET    Component Value Date/Time   NA 137 04/20/2017 0726   NA 140 03/10/2017 1816   NA 139 01/04/2017 1407   K 4.4 04/20/2017 0726   K 4.3 01/04/2017 1407   CL 103 04/20/2017 0726   CO2 23 04/20/2017 0726   CO2 26 01/04/2017 1407   GLUCOSE 110 (H) 04/20/2017 0726   GLUCOSE 99 01/04/2017 1407   BUN 27 (H) 04/20/2017 0726   BUN 31 (H) 03/10/2017 1816   BUN 20.7 01/04/2017 1407   CREATININE 0.97 04/20/2017 0726   CREATININE 1.2 01/04/2017 1407   CALCIUM 8.7 (L) 04/20/2017 0726   CALCIUM 9.8 01/04/2017 1407   GFRNONAA >60 04/20/2017 0726   GFRNONAA 77 12/11/2015 1502   GFRAA >60 04/20/2017 0726   GFRAA 88 12/11/2015 1502     Assessment/Plan: 2 Days Post-Op   Principal Problem:   Fracture of femoral neck, right (HCC) Active Problems:   Femur neck fracture (HCC)   Advance diet Up with therapy Continue plan per medicine WBAT Dry dressing PRN     DOUGLAS PARRY, BRANDON 04/21/2017, 6:54 AM  Discussed and agree with above.   Weightbearing: WBAT RLE Insicional and dressing care: Dressings left intact until  follow-up Orthopedic device(s): None Showering: keep wound dry with a press and seal saranwrap VTE prophylaxis: eliquis  Pain control: norco Follow - up plan: 2 weeks Contact information:    Marchia Bond, MD Cell [phone]

## 2017-04-21 NOTE — Social Work (Signed)
CSW met with spouse and patient at bedside and discussed SNF recommendations again. Both are in agreement that patient will need SNF and cannot be cared for by spouse at home.  CSW obtained permission to send out to [place] SNF's.  CSW will f/u for SNF offers.   , LCSW Clinical Social Worker [phone]   

## 2017-04-21 NOTE — NC FL2 (Signed)
[place] LEVEL OF CARE SCREENING TOOL     IDENTIFICATION  Patient Name: Chad Russell Birthdate: Jan 03, 1927 Sex: male Admission Date (Current Location): 04/19/2017  [facility] and [place] Number:  Herbalist and Address:  [facility], [place] [address] [place], [place] [address]      Provider Number: [id number]  Attending Physician Name and Address:  Oval Linsey, MD  Relative Name and Phone Number:  Ziair Penson, spouse, [phone]    Current Level of Care: Hospital Recommended Level of Care: [place] Prior Approval Number:    Date Approved/Denied:   PASRR Number: [id number]  Discharge Plan: SNF    Current Diagnoses: Patient Active Problem List   Diagnosis Date Noted  . Fracture of femoral neck, right ([place]) 04/19/2017  . Femur neck fracture ([place]) 04/19/2017  . MGUS (monoclonal gammopathy of unknown significance) 01/15/2017  . Chest congestion 11/30/2016  . Cough 11/30/2016  . Acute bronchitis 11/30/2016  . BPH with obstruction/lower urinary tract symptoms 09/07/2016  . Preoperative cardiovascular examination 08/24/2016  . Dysuria 05/13/2016  . Urinary retention 05/13/2016  . Nausea without vomiting 05/13/2016  . Pelvic pain 05/13/2016  . Balance problem 10/31/2013  . OSA (obstructive sleep apnea) 10/31/2013  . BPH (benign prostatic hyperplasia) 08/28/2012  . Atrial fibrillation ([place]) 06/08/2012  . Bacteremia 04/28/2012  . Hyperplasia of prostate with lower urinary tract symptoms (LUTS) 04/28/2012  . Calf pain 04/28/2012  . Moderate malnutrition ([place]) 04/28/2012  . Influenza-like illness 04/27/2012  . Infection of urinary tract - recurrent 04/26/2012  . NSTEMI (non-ST elevated myocardial infarction) ([place]) 03/15/2011  . Complete heart block ([place]) 03/15/2011  . HTN (hypertension) 03/15/2011    Orientation RESPIRATION BLADDER Height & Weight     Self, Time, Situation, Place  O2(Nasal  Cannula 2L) Incontinent Weight: 168 lb (76.2 kg) Height:  5\' 11"  (180.3 cm)  BEHAVIORAL SYMPTOMS/MOOD NEUROLOGICAL BOWEL NUTRITION STATUS      Continent Diet(See DC Summary)  AMBULATORY STATUS COMMUNICATION OF NEEDS Skin   Extensive Assist Verbally Surgical wounds                       Personal Care Assistance Level of Assistance  Dressing, Bathing, Feeding Bathing Assistance: Maximum assistance Feeding assistance: Limited assistance Dressing Assistance: Maximum assistance     Functional Limitations Info  Sight, Hearing, Speech Sight Info: Adequate Hearing Info: Adequate Speech Info: Adequate    SPECIAL CARE FACTORS FREQUENCY  PT (By licensed PT), OT (By licensed OT)     PT Frequency: 5x week  OT Frequency: 5x week            Contractures      Additional Factors Info  Code Status, Allergies Code Status Info: Full Allergies Info: DOXYCYCLINE, KEFLEX CEPHALEXIN  Psychotropic Info: n/a         Current Medications (04/21/2017):  This is the current hospital active medication list Current Facility-Administered Medications  Medication Dose Route Frequency Provider Last Rate Last Dose  . 0.9 %  sodium chloride infusion  75 mL/hr Intravenous Continuous Marchia Bond, MD 75 mL/hr at 04/20/17 2357 75 mL/hr at 04/20/17 2357  . acetaminophen (TYLENOL) tablet 650 mg  650 mg Oral Q6H PRN Marchia Bond, MD       Or  . acetaminophen (TYLENOL) suppository 650 mg  650 mg Rectal Q6H PRN Marchia Bond, MD      . albuterol (PROVENTIL) (2.5 MG/3ML) 0.083% nebulizer solution 3 mL  3  mL Inhalation Q4H PRN Marchia Bond, MD      . alum & mag hydroxide-simeth (MAALOX/MYLANTA) 200-200-20 MG/5ML suspension 30 mL  30 mL Oral Q4H PRN Marchia Bond, MD      . bisacodyl (DULCOLAX) suppository 10 mg  10 mg Rectal Daily PRN Marchia Bond, MD      . carbidopa-levodopa (SINEMET IR) 25-100 MG per tablet immediate release 1 tablet  1 tablet Oral TID Lorella Nimrod, MD   1 tablet at 04/21/17  0843  . docusate sodium (COLACE) capsule 100 mg  100 mg Oral QHS PRN Marchia Bond, MD      . docusate sodium (COLACE) capsule 100 mg  100 mg Oral BID Marchia Bond, MD   100 mg at 04/20/17 2102  . famotidine (PEPCID) tablet 10 mg  10 mg Oral QHS Lorella Nimrod, MD   10 mg at 04/20/17 2100  . ferrous sulfate tablet 325 mg  325 mg Oral TID Emiliano Dyer, MD   325 mg at 04/21/17 1240  . lisinopril (PRINIVIL,ZESTRIL) tablet 10 mg  10 mg Oral Daily Oval Linsey, MD   10 mg at 04/21/17 7893   And  . hydrochlorothiazide (MICROZIDE) capsule 12.5 mg  12.5 mg Oral Daily Oval Linsey, MD   12.5 mg at 04/21/17 0843  . HYDROcodone-acetaminophen (NORCO/VICODIN) 5-325 MG per tablet 1-2 tablet  1-2 tablet Oral Q6H PRN Marchia Bond, MD      . hydrocortisone cream 1 % 1 application  1 application Topical Daily PRN Marchia Bond, MD      . magnesium citrate solution 0.5 Bottle  0.5 Bottle Oral Daily Marchia Bond, MD      . magnesium citrate solution 1 Bottle  1 Bottle Oral Once PRN Marchia Bond, MD      . menthol-cetylpyridinium (CEPACOL) lozenge 3 mg  1 lozenge Oral PRN Marchia Bond, MD       Or  . phenol (CHLORASEPTIC) mouth spray 1 spray  1 spray Mouth/Throat PRN Marchia Bond, MD      . morphine 2 MG/ML injection 2 mg  2 mg Intravenous Q2H PRN Marchia Bond, MD      . multivitamin with minerals tablet 1 tablet  1 tablet Oral Daily Marchia Bond, MD   1 tablet at 04/21/17 0844  . ondansetron (ZOFRAN) tablet 4 mg  4 mg Oral Q6H PRN Marchia Bond, MD       Or  . ondansetron [facility]) injection 4 mg  4 mg Intravenous Q6H PRN Marchia Bond, MD      . polyethylene glycol (MIRALAX / GLYCOLAX) packet 17 g  17 g Oral Daily PRN Marchia Bond, MD      . rivaroxaban [facility]) tablet 20 mg  20 mg Oral Q supper Oval Linsey, MD      . senna [facility]) tablet 8.6 mg  1 tablet Oral BID Marchia Bond, MD   8.6 mg at 04/20/17 0054  . timolol (TIMOPTIC) 0.5 % ophthalmic solution 1 drop  1 drop Left  Eye Connye Burkitt, MD   1 drop at 04/20/17 2101  . timolol (TIMOPTIC) 0.5 % ophthalmic solution 1 drop  1 drop Both Eyes Daily Oval Linsey, MD   1 drop at 04/21/17 0844  . vitamin C (ASCORBIC ACID) tablet 500 mg  500 mg Oral Daily Marchia Bond, MD   500 mg at 04/21/17 8101     Discharge Medications: Please see discharge summary for a list of discharge medications.  Relevant Imaging Results:  Relevant Lab Results:  Additional Information SS#: [id number]  [place], LCSW

## 2017-04-22 DIAGNOSIS — M6281 Muscle weakness (generalized): Secondary | ICD-10-CM | POA: Diagnosis not present

## 2017-04-22 DIAGNOSIS — S79009S Unspecified physeal fracture of upper end of unspecified femur, sequela: Secondary | ICD-10-CM | POA: Diagnosis not present

## 2017-04-22 DIAGNOSIS — Z7901 Long term (current) use of anticoagulants: Secondary | ICD-10-CM

## 2017-04-22 DIAGNOSIS — G8911 Acute pain due to trauma: Secondary | ICD-10-CM | POA: Diagnosis not present

## 2017-04-22 DIAGNOSIS — I5032 Chronic diastolic (congestive) heart failure: Secondary | ICD-10-CM

## 2017-04-22 DIAGNOSIS — Z881 Allergy status to other antibiotic agents status: Secondary | ICD-10-CM | POA: Diagnosis not present

## 2017-04-22 DIAGNOSIS — W102XXA Fall (on)(from) incline, initial encounter: Secondary | ICD-10-CM

## 2017-04-22 DIAGNOSIS — G629 Polyneuropathy, unspecified: Secondary | ICD-10-CM

## 2017-04-22 DIAGNOSIS — Z9889 Other specified postprocedural states: Secondary | ICD-10-CM | POA: Diagnosis not present

## 2017-04-22 DIAGNOSIS — I442 Atrioventricular block, complete: Secondary | ICD-10-CM | POA: Diagnosis not present

## 2017-04-22 DIAGNOSIS — R2689 Other abnormalities of gait and mobility: Secondary | ICD-10-CM | POA: Diagnosis not present

## 2017-04-22 DIAGNOSIS — R41841 Cognitive communication deficit: Secondary | ICD-10-CM | POA: Diagnosis not present

## 2017-04-22 DIAGNOSIS — R278 Other lack of coordination: Secondary | ICD-10-CM | POA: Diagnosis not present

## 2017-04-22 DIAGNOSIS — Z96641 Presence of right artificial hip joint: Secondary | ICD-10-CM | POA: Diagnosis not present

## 2017-04-22 DIAGNOSIS — G20 Parkinson's disease: Secondary | ICD-10-CM

## 2017-04-22 DIAGNOSIS — S79911A Unspecified injury of right hip, initial encounter: Secondary | ICD-10-CM | POA: Diagnosis not present

## 2017-04-22 DIAGNOSIS — S7291XA Unspecified fracture of right femur, initial encounter for closed fracture: Secondary | ICD-10-CM | POA: Diagnosis not present

## 2017-04-22 DIAGNOSIS — R05 Cough: Secondary | ICD-10-CM | POA: Diagnosis not present

## 2017-04-22 DIAGNOSIS — R488 Other symbolic dysfunctions: Secondary | ICD-10-CM | POA: Diagnosis not present

## 2017-04-22 DIAGNOSIS — S72091A Other fracture of head and neck of right femur, initial encounter for closed fracture: Secondary | ICD-10-CM

## 2017-04-22 DIAGNOSIS — R2681 Unsteadiness on feet: Secondary | ICD-10-CM | POA: Diagnosis not present

## 2017-04-22 DIAGNOSIS — I482 Chronic atrial fibrillation: Secondary | ICD-10-CM | POA: Diagnosis not present

## 2017-04-22 DIAGNOSIS — M80851A Other osteoporosis with current pathological fracture, right femur, initial encounter for fracture: Secondary | ICD-10-CM

## 2017-04-22 DIAGNOSIS — K219 Gastro-esophageal reflux disease without esophagitis: Secondary | ICD-10-CM | POA: Diagnosis not present

## 2017-04-22 DIAGNOSIS — I739 Peripheral vascular disease, unspecified: Secondary | ICD-10-CM | POA: Diagnosis not present

## 2017-04-22 DIAGNOSIS — S72001D Fracture of unspecified part of neck of right femur, subsequent encounter for closed fracture with routine healing: Secondary | ICD-10-CM | POA: Diagnosis not present

## 2017-04-22 DIAGNOSIS — M25551 Pain in right hip: Secondary | ICD-10-CM | POA: Diagnosis not present

## 2017-04-22 DIAGNOSIS — I10 Essential (primary) hypertension: Secondary | ICD-10-CM | POA: Diagnosis not present

## 2017-04-22 LAB — TYPE AND SCREEN
ABO/RH(D): O POS
Antibody Screen: NEGATIVE
Unit division: 0
Unit division: 0

## 2017-04-22 LAB — CBC
HCT: 30.3 % — ABNORMAL LOW (ref 39.0–52.0)
HEMOGLOBIN: 9.6 g/dL — AB (ref 13.0–17.0)
MCH: 30 pg (ref 26.0–34.0)
MCHC: 31.7 g/dL (ref 30.0–36.0)
MCV: 94.7 fL (ref 78.0–100.0)
PLATELETS: 273 10*3/uL (ref 150–400)
RBC: 3.2 MIL/uL — ABNORMAL LOW (ref 4.22–5.81)
RDW: 15.8 % — AB (ref 11.5–15.5)
WBC: 12.9 10*3/uL — ABNORMAL HIGH (ref 4.0–10.5)

## 2017-04-22 LAB — BPAM RBC
Blood Product Expiration Date: 201903272359
Blood Product Expiration Date: 201903272359
Unit Type and Rh: 5100
Unit Type and Rh: 5100

## 2017-04-22 LAB — GLUCOSE, CAPILLARY: GLUCOSE-CAPILLARY: 99 mg/dL (ref 65–99)

## 2017-04-22 MED ORDER — ACETAMINOPHEN 325 MG PO TABS: 650.0000 mg | ORAL_TABLET | Freq: Four times a day (QID) | ORAL | 0 refills | Status: DC | PRN

## 2017-04-22 MED ORDER — CARBIDOPA-LEVODOPA 25-100 MG PO TABS: 1.0000 | ORAL_TABLET | Freq: Three times a day (TID) | ORAL | 5 refills | Status: DC

## 2017-04-22 MED ORDER — RANITIDINE HCL 150 MG PO TABS: 150.0000 mg | ORAL_TABLET | Freq: Every day | ORAL | Status: DC | PRN

## 2017-04-22 MED ORDER — FERROUS SULFATE 325 (65 FE) MG PO TABS: 325.0000 mg | ORAL_TABLET | Freq: Every day | ORAL | 0 refills | Status: DC

## 2017-04-22 MED ORDER — ALBUTEROL SULFATE (2.5 MG/3ML) 0.083% IN NEBU: 2.5000 mg | INHALATION_SOLUTION | RESPIRATORY_TRACT | 0 refills | Status: DC | PRN

## 2017-04-22 MED ORDER — HYDROCORTISONE 1 % EX CREA: 1.0000 "application " | TOPICAL_CREAM | Freq: Every day | CUTANEOUS | 0 refills | Status: DC | PRN

## 2017-04-22 MED ORDER — TIMOLOL MALEATE 0.5 % OP SOLN: OPHTHALMIC | 12 refills | Status: DC

## 2017-04-22 MED ORDER — LISINOPRIL-HYDROCHLOROTHIAZIDE 10-12.5 MG PO TABS: 1.0000 | ORAL_TABLET | Freq: Every day | ORAL | 3 refills | Status: DC

## 2017-04-22 MED ORDER — POLYETHYLENE GLYCOL 3350 17 G PO PACK: 17.0000 g | PACK | Freq: Every day | ORAL | 0 refills | Status: DC | PRN

## 2017-04-22 MED ORDER — RIVAROXABAN 20 MG PO TABS: ORAL_TABLET | ORAL | 1 refills | Status: DC

## 2017-04-22 MED ORDER — MAGNESIUM CITRATE PO SOLN: 148.0000 mL | Freq: Every day | ORAL | 0 refills | Status: DC

## 2017-04-22 NOTE — Discharge Summary (Signed)
Name: Chad Russell MRN: [id number] DOB: 12-Nov-1926 84 y.o. PCP: [name], MD  Date of Admission: 04/19/2017  2:20 PM Date of Discharge:  Attending Physician: Lucious Groves, DO  Discharge Diagnosis: 1.  Principal Problem:   Fracture of femoral neck, right (HCC) Active Problems:   Femur neck fracture [facility])  Discharge Medications: Allergies as of 04/22/2017      Reactions   Doxycycline Other (See Comments)   Upset stomach   Keflex [cephalexin] Other (See Comments)   Abdominal discomfort 04-19-17 Pt has tolerated orally      Medication List    STOP taking these medications   albuterol 108 (90 Base) MCG/ACT inhaler Commonly known as:  PROVENTIL HFA;VENTOLIN HFA Replaced by:  albuterol (2.5 MG/3ML) 0.083% nebulizer solution   docusate sodium 100 MG capsule Commonly known as:  COLACE   OVER THE COUNTER MEDICATION   PRESERVISION AREDS 2+MULTI VIT PO     TAKE these medications   acetaminophen 325 MG tablet Commonly known as:  TYLENOL Take 2 tablets (650 mg total) by mouth every 6 (six) hours as needed for moderate pain. Total acetaminophen intake not to exceed 4000mg  total from all sources per 24 hours. What changed:  additional instructions   albuterol (2.5 MG/3ML) 0.083% nebulizer solution Commonly known as:  PROVENTIL Take 3 mLs (2.5 mg total) by nebulization every 4 (four) hours as needed for wheezing or shortness of breath. Replaces:  albuterol 108 (90 Base) MCG/ACT inhaler   bisacodyl 5 MG EC tablet Commonly known as:  DULCOLAX Take 1 tablet (5 mg total) by mouth daily as needed for moderate constipation.   carbidopa-levodopa 25-100 MG tablet Commonly known as:  SINEMET IR Take 1 tablet by mouth 3 (three) times daily.   ferrous sulfate 325 (65 FE) MG tablet Take 1 tablet (325 mg total) by mouth daily with breakfast.   HYDROcodone-acetaminophen 5-325 MG tablet Commonly known as:  NORCO Take 1-2 tablets by mouth every 6 (six) hours as needed  for moderate pain. MAXIMUM TOTAL ACETAMINOPHEN DOSE IS 4000 MG PER DAY   hydrocortisone cream 1 % Apply 1 application topically daily as needed for itching.   lisinopril-hydrochlorothiazide 10-12.5 MG tablet Commonly known as:  PRINZIDE,ZESTORETIC Take 1 tablet by mouth daily.   LUTEIN-ZEAXANTHIN PO Take 1 tablet daily by mouth.   magnesium citrate solution Take 148 mLs by mouth daily. What changed:  how much to take   multivitamin with minerals Tabs tablet Take 1 tablet daily by mouth.   polyethylene glycol packet Commonly known as:  MIRALAX / GLYCOLAX Take 17 g by mouth daily as needed for mild constipation.   PROBIOTIC DAILY PO Take 1 tablet by mouth daily.   PROSTATE Caps Take 1 tablet daily by mouth. Prostate Plus otc supplement   ranitidine 150 MG tablet Commonly known as:  ZANTAC Take 1 tablet (150 mg total) by mouth daily as needed for heartburn.   rivaroxaban 20 MG Tabs tablet Commonly known as:  XARELTO TAKE ONE TABLET (20mg ) BY MOUTH DAILY WITH SUPPER   sennosides-docusate sodium 8.6-50 MG tablet Commonly known as:  SENOKOT-S Take 2 tablets by mouth daily.   timolol 0.5 % ophthalmic solution Commonly known as:  TIMOPTIC One drop in both eyes in the morning and one drop in the left eye at night. What changed:    how much to take  when to take this   vitamin C 500 MG tablet Commonly known as:  ASCORBIC ACID Take 500 mg by  mouth daily.            Durable Medical Equipment  (From admission, onward)        Start     Ordered   04/22/17 1428  DME 3-in-1  Once     04/22/17 1431   04/22/17 1428  For home use only DME 4 wheeled rolling walker with seat  (Walkers)  Once    Question:  Patient needs a walker to treat with the following condition  Answer:  Hip fracture, right, closed, with routine healing, subsequent encounter   04/22/17 1431       Discharge Care Instructions  (From admission, onward)        Start     Ordered   04/19/17 0000   Weight bearing as tolerated     04/19/17 2120      Disposition and follow-up:   Chad Russell was discharged from [facility] in good condition.  At the hospital follow up visit please address:  R femoral neck fracture: -continue aggressive PT and OT -follow up with orthopedic surgery -follow up with neurologist to adjust parkinson's disease medication since pt with recent fall -f/u with PCP for discussion on possibly starting bisphosphonate therapy as had fragility fracture - please see discussion below.  Permanent Afib Complete Heart Block: -not on rate control medications, baseline rhythm is junctional high 30s to 40 due to complete heart block  2.  Labs / imaging needed at time of follow-up: CBC, BMP, possibly Dexa scan  3.  Pending labs/ test needing follow-up: none  Follow-up Appointments:  Contact information for follow-up providers    Marchia Bond, MD. Schedule an appointment as soon as possible for a visit in 2 week(s).   Specialty:  Orthopedic Surgery Contact information: [place] [address] [phone]        Narda Amber K, DO Follow up in 2 week(s).   Specialty:  Neurology Why:  please schedule a follow up appointment with your neurologist for around 2 weeks to one month from now Contact information: [address] [place] [address] [phone]        [name], MD. Schedule an appointment as soon as possible for a visit in 2 week(s).   Specialties:  Family Medicine, Sports Medicine Contact information: [place] [address] [phone]            Contact information for after-discharge care    Destination    [facility] Follow up.   Service:  Skilled Nursing Contact information: [place] [facility] Course by problem list: Principal Problem:    Fracture of femoral neck, right ([place]) Active Problems:   Femur neck fracture ([place])   R femoral neck fracture Patient arrived to [facility] after a fall which resulted in a right femoral neck fracture.  He has some problems with balance at baseline due to Parkinson's disease and peripheral neuropathy from peripheral arterial disease.  However on this occasion the patient was at the front door he reports there is a step up from the entrance to his [place] which is a tile floor.  He negotiated the step up poorly and fell on his right hip.  He was up almost immediately with the help of his wife who was there and really had minimal  pain until 2 days following when the femur likely displaced superiorly.  When he arrived in the ED he had pelvis and femur x-rays which showed a right middle neck fracture with superior displacement.  Orthopedic surgery was consulted and he was taken that same evening for a right hip hemiarthroplasty.  He tolerated the procedure well, had good pain control received early physical therapy the following day.  Did not require oxygen, his heart rate remained stable with no new arrhythmias seen on telemetry. physical therapy and occupational therapy evaluation in which PDX surgery was in agreement with is that the patient should go to a skilled nursing facility rehab to get continued physical therapy and allow patient to build his strength back up. He was restarted on Xarelto for anticoagulation. His Hgb had been trending down slightly after surgery (11.4>9.6), possibly due to intraoperative blood loss and some dilution from IVF which he received for a couple of days post op. Would recommend he get a f/u CBC in a week with PCP to monitor Hgb.  Osteoporosis: Due to fragility fracture in hip (in otherwise reported active 84yo), this qualifies him for diagnosis of osteoporosis. Recommendation is to f/u with PCP to discuss possibly starting bisphosphonate therapy for treatment; if he  does start would coordinate with ortho for appropriate timing (usually 6wks after fracture and repair if expected healing is occurring). PET scan for his MGUS in 11/18 did not show any active malignancy including skeletal; also fractures 2/2 MGUS mostly are axial and not in extremities. Would consider Dexa scan +/- earlier f/u with oncology based on Dexa.  Complete heart block: Patient with stable, asymptomatic complete heart block not on therapy per patient preference. His HR ranges from 30-40s.  A fib: Patient with a fib, on Xarelto for anticoagulation.  Discharge Vitals:   BP 130/61 (BP Location: Right Arm)   Pulse (!) 41   Temp 99.4 F (37.4 C) (Oral)   Resp 18   Ht 5\' 11"  (1.803 m)   Wt 168 lb (76.2 kg)   SpO2 93%   BMI 23.43 kg/m   Pertinent Labs, Studies, and Procedures:  CBC Latest Ref Rng & Units 04/22/2017 04/21/2017 04/20/2017  WBC 4.0 - 10.5 K/uL 12.9(H) 11.5(H) 12.2(H)  Hemoglobin 13.0 - 17.0 g/dL 9.6(L) 10.2(L) 11.0(L)  Hematocrit 39.0 - 52.0 % 30.3(L) 31.7(L) 33.5(L)  Platelets 150 - 400 K/uL 273 287 277   BMP Latest Ref Rng & Units 04/21/2017 04/20/2017 04/19/2017  Glucose 65 - 99 mg/dL 108(H) 110(H) 106(H)  BUN 6 - 20 mg/dL 29(H) 27(H) 26(H)  Creatinine 0.61 - 1.24 mg/dL 0.89 0.97 0.86  BUN/Creat Ratio 10 - 24 - - -  Sodium 135 - 145 mmol/L 138 137 138  Potassium 3.5 - 5.1 mmol/L 4.0 4.4 4.1  Chloride 101 - 111 mmol/L 104 103 104  CO2 22 - 32 mmol/L 23 23 24   Calcium 8.9 - 10.3 mg/dL 8.7(L) 8.7(L) 9.1   CLINICAL DATA:  Fall with leg pain  EXAM: RIGHT FEMUR 2 VIEWS  COMPARISON:  04/19/2017  FINDINGS: Right pubic rami are intact. Right femoral head projects in joint. Acute right femoral neck fracture with cephalad migration of the trochanter. Extensive vascular calcifications. Patellofemoral degenerative changes. No dislocation evident.  IMPRESSION: Acute, mildly displaced right femoral neck fracture   Electronically Signed   By: Donavan Foil  M.D.   On: 04/19/2017 14:13   Discharge Instructions: Discharge Instructions    (HEART FAILURE PATIENTS) Call MD:  Anytime you have any of  the following symptoms: 1) 3 pound weight gain in 24 hours or 5 pounds in 1 week 2) shortness of breath, with or without a dry hacking cough 3) swelling in the hands, feet or stomach 4) if you have to sleep on extra pillows at night in order to breathe.   Complete by:  As directed    Call MD for:   Complete by:  As directed    Call MD for:  difficulty breathing, headache or visual disturbances   Complete by:  As directed    Call MD for:  extreme fatigue   Complete by:  As directed    Call MD for:  hives   Complete by:  As directed    Call MD for:  persistant dizziness or light-headedness   Complete by:  As directed    Call MD for:  persistant nausea and vomiting   Complete by:  As directed    Call MD for:  redness, tenderness, or signs of infection (pain, swelling, redness, odor or green/yellow discharge around incision site)   Complete by:  As directed    Call MD for:  severe uncontrolled pain   Complete by:  As directed    Call MD for:  temperature >100.4   Complete by:  As directed    Diet - low sodium heart healthy   Complete by:  As directed    Discharge instructions   Complete by:  As directed    Patient to continue rehab with PT and OT for his repaired right hip fracture.   Should f/u with PCP in 2wks with CBC and discussion about possibly starting bisphosphonate therapy.   Increase activity slowly   Complete by:  As directed    Posterior total hip precautions   Complete by:  As directed    Weight bearing as tolerated   Complete by:  As directed       Signed: Alphonzo Grieve, MD 04/22/2017, 2:50 PM

## 2017-04-22 NOTE — Clinical Social Work Note (Addendum)
Pt spouse has decided on bed at [facility]--Bed is available today.  [place], [place]

## 2017-04-22 NOTE — Progress Notes (Signed)
RN gave a report to [employer], Therapist, sports at [facility]. Pt's wife is already there waiting for his arrival. RN called  [facility] to see approximate time; waiting on their call.

## 2017-04-22 NOTE — Progress Notes (Signed)
   Subjective:  Patient seen while working with PT this morning. He endorses no pain in his leg.  Objective:  Vital signs in last 24 hours: Vitals:   04/21/17 0708 04/21/17 1500 04/21/17 2234 04/22/17 0449  BP: (!) 120/58 (!) 93/55 (!) 142/63 130/61  Pulse: (!) 44 77 (!) 43 (!) 41  Resp: 18 18    Temp: 98.3 F (36.8 C) 98.1 F (36.7 C) 99.7 F (37.6 C) 99.4 F (37.4 C)  TempSrc: Oral Oral Oral Oral  SpO2: 98% 99% (!) 89% 93%  Weight:      Height:       Constitutional: NAD, sitting up in bed Ext: RLE with dry dressing, no hematoma, no increased warmth or tenderness per Dr. Frederico Hamman exam Neuro: CN 2-12 grossly intact  Assessment/Plan:  Principal Problem:   Fracture of femoral neck, right (HCC) Active Problems:   Femur neck fracture (HCC)  R femoral neck fracture s/p partial arthroplasty: Recovering well, working with PT. Plan to D/C to SNF for further rehab. Has not required pain meds. On Xarelto already for A fib.  Permanent Afib Complete Heart Block Not on rate control medications, baseline rhythm is junctional high 30s to 40 due to complete heart block. Asymptomatic. On Xarelto.  Dispo: Anticipated discharge today.   Alphonzo Grieve, MD 04/22/2017, 12:26 PM IMTS - PGY2 Pager [phone]

## 2017-04-22 NOTE — Progress Notes (Signed)
Physical Therapy Treatment Patient Details Name: Chad Russell MRN: [id number] DOB: 1926/04/13 Today's Date: 04/22/2017    History of Present Illness 84 y.o. male s/p R bipolar hip hemiarhroplasty 2/26 after fracture from a fall at home. PMH includes: CAD, A Fib, Bradycardia, Cataract    PT Comments    Patient is making progress toward mobility goals. Continue to progress as tolerated with anticipated d/c to SNF for further skilled PT services.     Follow Up Recommendations  SNF;Supervision/Assistance - 24 hour     Equipment Recommendations  Rolling walker with 5" wheels;3in1 (PT)    Recommendations for Other Services OT consult     Precautions / Restrictions Precautions Precautions: Posterior Hip Precaution Comments: pt able to recall 2/3 precautions  Restrictions Weight Bearing Restrictions: Yes RLE Weight Bearing: Weight bearing as tolerated    Mobility  Bed Mobility Overal bed mobility: Needs Assistance Bed Mobility: Supine to Sit     Supine to sit: Mod assist     General bed mobility comments: assist to bring R LE to EOB and to elevate trunk into sitting; cues for sequencing  Transfers Overall transfer level: Needs assistance Equipment used: Rolling walker (2 wheeled) Transfers: Sit to/from [facility] Sit to Stand: Mod assist Stand pivot transfers: Mod assist;+2 safety/equipment       General transfer comment: assist to power up into standing and to gain balance up on stand; cues for safe hand placement and technique; posterior bias upon standing  Ambulation/Gait Ambulation/Gait assistance: Min assist;+2 safety/equipment Ambulation Distance (Feet): 50 Feet Assistive device: Rolling walker (2 wheeled) Gait Pattern/deviations: Step-through pattern;Decreased stance time - right;Decreased step length - left;Trunk flexed Gait velocity: decreased   General Gait Details: cues for sequencing and posture; pt with leg length  discrepancy   Stairs            Wheelchair Mobility    Modified Rankin (Stroke Patients Only)       Balance Overall balance assessment: History of Falls;Needs assistance Sitting-balance support: Feet unsupported;No upper extremity supported Sitting balance-Leahy Scale: Fair     Standing balance support: During functional activity;Bilateral upper extremity supported Standing balance-Leahy Scale: Poor                              Cognition Arousal/Alertness: Awake/alert Behavior During Therapy: WFL for tasks assessed/performed Overall Cognitive Status: Within Functional Limits for tasks assessed                                        Exercises      General Comments        Pertinent Vitals/Pain Pain Assessment: No/denies pain Pain Intervention(s): Monitored during session;Premedicated before session    Home Living                      Prior Function            PT Goals (current goals can now be found in the care plan section) Acute Rehab PT Goals Patient Stated Goal: return home PT Goal Formulation: With patient Time For Goal Achievement: 04/27/17 Potential to Achieve Goals: Fair Progress towards PT goals: Progressing toward goals    Frequency    Min 3X/week      PT Plan Current plan remains appropriate    Co-evaluation  AM-PAC PT "6 Clicks" Daily Activity  Outcome Measure  Difficulty turning over in bed (including adjusting bedclothes, sheets and blankets)?: Unable Difficulty moving from lying on back to sitting on the side of the bed? : Unable Difficulty sitting down on and standing up from a chair with arms (e.g., wheelchair, bedside commode, etc,.)?: Unable Help needed moving to and from a bed to chair (including a wheelchair)?: A Lot Help needed walking in hospital room?: A Little Help needed climbing 3-5 steps with a railing? : Total 6 Click Score: 9    End of Session Equipment  Utilized During Treatment: Gait belt Activity Tolerance: Patient tolerated treatment well Patient left: in chair;with call bell/phone within reach;Other (comment)(pt on [facility] and NT aware) Nurse Communication: Mobility status PT Visit Diagnosis: Unsteadiness on feet (R26.81);Repeated falls (R29.6);Other abnormalities of gait and mobility (R26.89);Muscle weakness (generalized) (M62.81);History of falling (Z91.81);Pain Pain - Right/Left: Right Pain - part of body: Hip     Time: 5537-4827 PT Time Calculation (min) (ACUTE ONLY): 40 min  Charges:  $Gait Training: 23-37 mins $Therapeutic Activity: 8-22 mins                    G Codes:       Earney Navy, PTA Pager: [phone]     Darliss Cheney 04/22/2017, 1:00 PM

## 2017-04-22 NOTE — Clinical Social Work Note (Signed)
Clinical Social Worker facilitated patient discharge including contacting patient family and facility to confirm patient discharge plans.  Clinical information faxed to facility and family agreeable with plan.  CSW arranged ambulance transport via [facility] to [place].  RN to call  [phone] for report prior to discharge.  Clinical Social Worker will sign off for now as social work intervention is no longer needed. Please consult [place] again if new need arises.  [place], [place]

## 2017-04-22 NOTE — Care Management Important Message (Signed)
Important Message  Patient Details  Name: Chad Russell MRN: [id number] Date of Birth: 1927/01/23   Medicare Important Message Given:  Yes    Orbie Pyo 04/22/2017, 2:23 PM

## 2017-04-22 NOTE — Clinical Social Work Note (Addendum)
Prescription was called in to pt's personal pharmacy. However, facility has to have script for their pharmacy. Pt's wife is going to the Ortho office to pick up script to take to facility. [facility] has been set up.  Chad Russell, [place]

## 2017-04-22 NOTE — Clinical Social Work Placement (Signed)
   CLINICAL SOCIAL WORK PLACEMENT  NOTE  Date:  04/22/2017  Patient Details  Name: Chad Russell MRN: [id number] Date of Birth: 1926-05-02  Clinical Social Work is seeking post-discharge placement for this patient at the [place] level of care (*CSW will initial, date and re-position this form in  chart as items are completed):      Patient/family provided with [place] Work Department's list of facilities offering this level of care within the geographic area requested by the patient (or if unable, by the patient's family).  Yes   Patient/family informed of their freedom to choose among providers that offer the needed level of care, that participate in Medicare, Medicaid or managed care program needed by the patient, have an available bed and are willing to accept the patient.      Patient/family informed of [facility]'s ownership interest in [facility] and [facility], as well as of the fact that they are under no obligation to receive care at these facilities.  PASRR submitted to EDS on       PASRR number received on 04/21/17     Existing PASRR number confirmed on       FL2 transmitted to all facilities in geographic area requested by pt/family on 04/21/17     FL2 transmitted to all facilities within larger geographic area on       Patient informed that his/her managed care company has contracts with or will negotiate with certain facilities, including the following:        Yes   Patient/family informed of bed offers received.  Patient chooses bed at [facility]     Physician recommends and patient chooses bed at      Patient to be transferred to [facility] on 04/22/17.  Patient to be transferred to facility by [facility]     Patient family notified on 04/22/17 of transfer.  Name of family member notified:  Patrica (spouse)     PHYSICIAN       Additional Comment:     _______________________________________________ Eileen Stanford, LCSW 04/22/2017, 4:17 PM

## 2017-04-22 NOTE — Clinical Social Work Note (Addendum)
Spoke with pt at bedside and explained his wife has chosen [facility]--pt agreeable. CSW spoke with MD and summary will be provided. Insurance authorization has been started.   [place], [place]

## 2017-04-23 NOTE — Telephone Encounter (Signed)
Pt seen at hospital 04/19/2017

## 2017-04-25 DIAGNOSIS — R2689 Other abnormalities of gait and mobility: Secondary | ICD-10-CM | POA: Diagnosis not present

## 2017-04-25 DIAGNOSIS — I482 Chronic atrial fibrillation: Secondary | ICD-10-CM | POA: Diagnosis not present

## 2017-04-25 DIAGNOSIS — S79009S Unspecified physeal fracture of upper end of unspecified femur, sequela: Secondary | ICD-10-CM | POA: Diagnosis not present

## 2017-04-25 DIAGNOSIS — Z96641 Presence of right artificial hip joint: Secondary | ICD-10-CM | POA: Diagnosis not present

## 2017-04-25 DIAGNOSIS — M25551 Pain in right hip: Secondary | ICD-10-CM | POA: Diagnosis not present

## 2017-04-25 DIAGNOSIS — I442 Atrioventricular block, complete: Secondary | ICD-10-CM | POA: Diagnosis not present

## 2017-04-25 DIAGNOSIS — G20 Parkinson's disease: Secondary | ICD-10-CM | POA: Diagnosis not present

## 2017-04-26 DIAGNOSIS — Z9889 Other specified postprocedural states: Secondary | ICD-10-CM | POA: Diagnosis not present

## 2017-04-26 DIAGNOSIS — G20 Parkinson's disease: Secondary | ICD-10-CM | POA: Diagnosis not present

## 2017-04-26 DIAGNOSIS — S7291XA Unspecified fracture of right femur, initial encounter for closed fracture: Secondary | ICD-10-CM | POA: Diagnosis not present

## 2017-04-26 DIAGNOSIS — I442 Atrioventricular block, complete: Secondary | ICD-10-CM | POA: Diagnosis not present

## 2017-04-27 DIAGNOSIS — I442 Atrioventricular block, complete: Secondary | ICD-10-CM | POA: Diagnosis not present

## 2017-04-27 NOTE — Progress Notes (Signed)
To clarify his assessment diagnosis: He has Chronic Heart Failure with Preserved Ejection fraction- this was stable during the admission.  The underlying cause of his Heart failure is unknown to me.  Chad Groves, DO

## 2017-04-28 DIAGNOSIS — R2689 Other abnormalities of gait and mobility: Secondary | ICD-10-CM | POA: Diagnosis not present

## 2017-04-28 DIAGNOSIS — I10 Essential (primary) hypertension: Secondary | ICD-10-CM | POA: Diagnosis not present

## 2017-04-28 DIAGNOSIS — M25551 Pain in right hip: Secondary | ICD-10-CM | POA: Diagnosis not present

## 2017-04-28 DIAGNOSIS — R05 Cough: Secondary | ICD-10-CM | POA: Diagnosis not present

## 2017-04-28 DIAGNOSIS — S79009S Unspecified physeal fracture of upper end of unspecified femur, sequela: Secondary | ICD-10-CM | POA: Diagnosis not present

## 2017-05-04 ENCOUNTER — Other Ambulatory Visit: Payer: Self-pay | Admitting: *Deleted

## 2017-05-04 DIAGNOSIS — R2689 Other abnormalities of gait and mobility: Secondary | ICD-10-CM | POA: Diagnosis not present

## 2017-05-04 DIAGNOSIS — S79009S Unspecified physeal fracture of upper end of unspecified femur, sequela: Secondary | ICD-10-CM | POA: Diagnosis not present

## 2017-05-04 DIAGNOSIS — M25551 Pain in right hip: Secondary | ICD-10-CM | POA: Diagnosis not present

## 2017-05-04 DIAGNOSIS — S72001D Fracture of unspecified part of neck of right femur, subsequent encounter for closed fracture with routine healing: Secondary | ICD-10-CM | POA: Diagnosis not present

## 2017-05-04 NOTE — Patient Outreach (Signed)
[facility]) Care Management  05/04/2017  Chad Russell [birth year] [id number]   CSW made an initial attempt to try and contact patient today to perform phone assessment, as well as assess and assist with social needs and services, without success.  A HIPAA compliant message was left for patient on voicemail.  CSW is currently awaiting a return call. CSW will make a second outreach attempt on Thursday, May 05, 2017, if CSW does not receive a return call from patient in the meantime. [name], BSW, MSW, LCSW  Licensed Education officer, environmental Health System  Mailing [place] [address], [place], [place] [address] Physical Address-[address], [place], [place] [address] Toll Free Main # [phone] Fax # [phone] Cell # [phone]  Office # [phone] [email or web address]

## 2017-05-05 ENCOUNTER — Other Ambulatory Visit: Payer: Self-pay | Admitting: *Deleted

## 2017-05-05 ENCOUNTER — Encounter: Payer: Self-pay | Admitting: *Deleted

## 2017-05-05 NOTE — Patient Outreach (Addendum)
[facility]) Care Management  05/05/2017  Chad Russell 06-18-1926 [id number]   CSW made a second attempt to try and contact patient today to perform phone assessment, as well as assess and assist with social work needs and services, without success.  A HIPAA compliant message was left for patient on voicemail.  CSW continues to await a return call.  CSW will mail an outreach letter to patient's home, encouraging patient to contact CSW at their earliest convenience, if patient is interested in receiving social work services through [place] with [facility].  If CSW does not receive a return call from patient within the next 10 business days, CSW will make one last call attempt before proceeding with case closure.  Required number of phone attempts will have been made and outreach letter mailed.  CSW will make arrangements to meet with patient at [facility] [place] where patient currently resides to receive short-term rehabilitative services, on Monday, May 09, 2017 at 12:00 PM to perform the initial assessment. [name], BSW, MSW, LCSW  Licensed Education officer, environmental Health System  Mailing [place] [address], [place], [place] [address] Physical Address-[address], [place], [place] [address] Toll Free Main # [phone] Fax # [phone] Cell # [phone]  Office # [phone] [email or web address]

## 2017-05-06 DIAGNOSIS — S79009S Unspecified physeal fracture of upper end of unspecified femur, sequela: Secondary | ICD-10-CM | POA: Diagnosis not present

## 2017-05-06 DIAGNOSIS — M25551 Pain in right hip: Secondary | ICD-10-CM | POA: Diagnosis not present

## 2017-05-06 DIAGNOSIS — R2689 Other abnormalities of gait and mobility: Secondary | ICD-10-CM | POA: Diagnosis not present

## 2017-05-09 ENCOUNTER — Encounter: Payer: Self-pay | Admitting: *Deleted

## 2017-05-09 ENCOUNTER — Other Ambulatory Visit: Payer: Self-pay | Admitting: *Deleted

## 2017-05-09 DIAGNOSIS — R488 Other symbolic dysfunctions: Secondary | ICD-10-CM | POA: Diagnosis not present

## 2017-05-09 DIAGNOSIS — Z96641 Presence of right artificial hip joint: Secondary | ICD-10-CM | POA: Diagnosis not present

## 2017-05-09 DIAGNOSIS — R41841 Cognitive communication deficit: Secondary | ICD-10-CM | POA: Diagnosis not present

## 2017-05-09 DIAGNOSIS — M6281 Muscle weakness (generalized): Secondary | ICD-10-CM | POA: Diagnosis not present

## 2017-05-09 DIAGNOSIS — R278 Other lack of coordination: Secondary | ICD-10-CM | POA: Diagnosis not present

## 2017-05-09 DIAGNOSIS — R2681 Unsteadiness on feet: Secondary | ICD-10-CM | POA: Diagnosis not present

## 2017-05-09 NOTE — Patient Outreach (Signed)
[facility]) Care Management  05/09/2017  Chad Russell 1926/09/07 [id number]   CSW was able to make initial contact with patient and patient's wife, Chad Russell today to perform the assessment, as well as assess and assist with social work needs and services.  CSW met with patient and Mrs. Schuneman at [facility] [place] where patient currently resides to receive short-term rehabilitative services.  CSW introduced self, explained role and types of services provided through [facility] ([facility]).  CSW further explained to patient and Mrs. Latini that CSW works with patient's Primary Care Physician, Dr. [name], also with [facility] Care Management. CSW then explained the reason for the visit, indicating that CSW thought that patient would benefit from social work services and resources to assist with discharge planning needs and services from the skilled nursing facility.  CSW obtained two HIPAA compliant identifiers from patient, which included patient's name and date of birth. Patient admits that he is still experiencing a great deal of pain in his "operated leg".  Patient stated, "I don't know if I pulled something when I fell, but the pain radiates from my knee up into my groin area".  Patient is also suffering from pain on both heels due to blisters that have formed.  The blisters are currently being dressed and attended to on a daily basis.  Patient reports that he is still able to ambulate and perform all activities of daily living independently.  Patient further reported that his pain has not interfered with his interactions with therapies (both physical and occupational).  Patient indicated that he is able to ambulate without assistance, but still uses his walker for safety purposes.  Patient has been encouraged to continue to utilize his walker when ambulating around his home.  Mrs. Crayton reported that she recently went  out and bought two walkers, one for inside the home, one for outside the home, as well as a raised toilet seat and a wheelchair. Patient reported that he was supposed to discharge home today, as his [employer] Medicare insurance has denied coverage for today's stay at [facility].  However, Mrs. Hengst reported that they are willing to pay out-of-pocket for patient to stay until Wednesday, May 11, 2017, as they both feel that patient would benefit from a few more days of therapy.  Patient is agreeable to having home health services arranged on his behalf.  Therefore, CSW agreed to converse with the social worker at [facility] to assist with arranging home care services for patient.  CSW will follow-up with patient on Thursday, May 12, 2017 to ensure that patient has a smooth transition back into the home, as well as assess for any additional social work needs and services. THN CM Care Plan Problem One     Most Recent Value  Care Plan Problem One  Level of care issues.  Role Documenting the Problem One  Clinical Social Worker  Care Plan for Problem One  Active  [facility] Long Term Goal   Patient will have home health services and durable medical equipment in place, prior to returning home to live, within the next 45 days.  THN Long Term Goal Start Date  05/09/17  Interventions for Problem One Long Term Goal  CSW will assist patient and discharge planning coordinator at the skilled nursing facility with arranging patient's discharge plans.  THN CM Short Term Goal #1   Patient will decide on a home health agency of  choice, with regards to home care services, within the next three weeks.  THN CM Short Term Goal #1 Start Date  05/09/17  Interventions for Short Term Goal #1  CSW has provided patient with a list of agencies for him to review.  THN CM Short Term Goal #2   Patient will decide on an agency of choice, with regards to durable medical equipment, within the next three weeks.  THN CM Short  Term Goal #2 Start Date  05/09/17  Interventions for Short Term Goal #2  CSW has provided patient with a list of agencies for him to review.    [name], BSW, MSW, LCSW  Licensed Education officer, environmental Health System  Mailing [place] [address], [place], [place] [address] Physical Address-[address], [place], [place] [address] Toll Free Main # [phone] Fax # [phone] Cell # [phone]  Office # [phone] [email or web address]

## 2017-05-10 DIAGNOSIS — K219 Gastro-esophageal reflux disease without esophagitis: Secondary | ICD-10-CM | POA: Diagnosis not present

## 2017-05-10 DIAGNOSIS — I442 Atrioventricular block, complete: Secondary | ICD-10-CM | POA: Diagnosis not present

## 2017-05-10 DIAGNOSIS — I482 Chronic atrial fibrillation: Secondary | ICD-10-CM | POA: Diagnosis not present

## 2017-05-10 DIAGNOSIS — Z96641 Presence of right artificial hip joint: Secondary | ICD-10-CM | POA: Diagnosis not present

## 2017-05-11 DIAGNOSIS — M25551 Pain in right hip: Secondary | ICD-10-CM | POA: Diagnosis not present

## 2017-05-11 DIAGNOSIS — R2689 Other abnormalities of gait and mobility: Secondary | ICD-10-CM | POA: Diagnosis not present

## 2017-05-11 DIAGNOSIS — S79009S Unspecified physeal fracture of upper end of unspecified femur, sequela: Secondary | ICD-10-CM | POA: Diagnosis not present

## 2017-05-12 ENCOUNTER — Other Ambulatory Visit: Payer: Self-pay | Admitting: *Deleted

## 2017-05-12 NOTE — Patient Outreach (Signed)
[facility]) Care Management  05/12/2017  ALIM CATTELL [birth year] [id number]   CSW was able to make contact with patient's wife, Dayon Witt today to follow-up regarding current placement arrangements for patient at [facility].  Patient has been residing at [facility] [place], to receive short-term rehabilitative services.  Patient is being discharged home today with home health services arranged and durable medical equipment in place.  Mrs. Stelle denies having any additional social work needs at present.  CSW was able to ensure that Mrs. Kock has the correct contact information for CSW, encouraging Mrs. Splawn to contact CSW directly if additional social work needs arise in the near future.  Mrs. Hendriks voiced understanding and was agreeable to this plan. CSW will perform a case closure on patient, as all goals of treatment have been met from social work standpoint and no additional social work needs have been identified at this time.  CSW will arrange for an RNCM to provide community case management services to patient.  CSW will notify patient's RNCM with [facility], of CSW's plans to close patient's case.  CSW will fax an update to patient's Primary Care Physician, Dr. [name] to ensure that they are aware of CSW's involvement with patient's plan of care.  CSW will submit a case closure request to Alycia Rossetti, Care Management Assistant with [facility], in the form of an [employer].    THN CM Care Plan Problem One     Most Recent Value  Care Plan Problem One  Level of care issues.  Role Documenting the Problem One  Clinical Social Worker  Care Plan for Problem One  Active  [facility] Long Term Goal   Patient will have home health services and durable medical equipment in place, prior to returning home to live, within the next 45 days.  THN Long Term  Goal Start Date  05/09/17  THN Long Term Goal Met Date  05/12/17  Interventions for Problem One Long Term Goal  Home health services have been arranged for patient through a home health agency of choice.  THN CM Short Term Goal #1   Patient will decide on a home health agency of choice, with regards to home care services, within the next three weeks.  THN CM Short Term Goal #1 Start Date  05/09/17  [facility] CM Short Term Goal #1 Met Date  05/12/17  Interventions for Short Term Goal #1  Home health services will begin for patient within the next week.  THN CM Short Term Goal #2   Patient will decide on an agency of choice, with regards to durable medical equipment, within the next three weeks.  THN CM Short Term Goal #2 Start Date  05/09/17  [facility] CM Short Term Goal #2 Met Date  05/12/17  Interventions for Short Term Goal #2  Patient's wife has already purchased all durable medical equipment that patient will need in the home.    [name], BSW, MSW, LCSW  Licensed Education officer, environmental Health System  Mailing [place] [address], [place], [place] [address] Physical Address-[address], [place], [place] [address] Toll Free Main # [phone] Fax # [phone] Cell # [phone]  Office # [phone] [email or web address]

## 2017-05-16 ENCOUNTER — Other Ambulatory Visit: Payer: Self-pay | Admitting: *Deleted

## 2017-05-16 NOTE — Patient Outreach (Signed)
[facility]) Care Management  05/16/2017  LEVIS NAZIR [birth year] [id number]    Transition of care (initial outreach)  RN attempted to reach pt today however unsuccessful but able to leave a HIPAA approved voice message requesting a call back. Will attempted another outreach call tomorrow for pending [facility] services.  Raina Mina, RN Care Management Coordinator [place] Office [phone]

## 2017-05-17 ENCOUNTER — Encounter: Payer: Self-pay | Admitting: *Deleted

## 2017-05-17 ENCOUNTER — Other Ambulatory Visit: Payer: Self-pay | Admitting: *Deleted

## 2017-05-17 NOTE — Patient Outreach (Signed)
[facility]) Care Management  05/17/2017  LOGON UTTECH 04/11/1926 [id number]   Transition of care post SNF discharge  RN spoke with pt and introduced the [facility] program and purpose for today's call. RN verified pt's identifiers and provided permission to speak with his wife (Particia). RN spoke with the spouse and again introduced the [facility] program and services. Wife states pt is "doing just fine" with no encountered issues. States pt has a walker upstairs and downstairs to use during ambulation with no reported falls or injuries. Reports pending appointments with pt's providers over the next few weeks and states pt has a good appetite with no delays weighing 166 lbs today. RN able to complete the transition of care template and reviewed all medications with no needs. RN inquired on transportation to his medical appointments again no needs found at this time. RN offered community resources that may be able to assist pt further however declined. RN offered ongoing transition of care calls to inquire on pt's weekly recovery however this options was also declined. RN provided a contact number for this case manager and directly to the [facility] office if needed in the future. Case will be closed and spouse informed Dr. Nyoka Cowden will be notified of pt's disposition with [facility] services.  Raina Mina, RN Care Management Coordinator [place] Office [phone]

## 2017-05-22 NOTE — Progress Notes (Signed)
HPI The patient presents for followup of [facility]B. Since I last saw him he had repair of a femoral neck fracture.  He actually did very well with this.  He went to rehab and he made remarkable progress.  Unfortunately they cut his rehab stay  short despite the fact that he was making phenomenal progress both with physical therapy and with speech therapy.  The appeal to the insurance company stated that he was not progressing because he has dementia.  He is actually the least demented person I know.  He came home to try to do physical therapy at home.  From a cardiovascular standpoint he denies any cardiovascular symptoms. The patient denies any new symptoms such as chest discomfort, neck or arm discomfort. There has been no new shortness of breath, PND or orthopnea. There have been no reported palpitations, presyncope or syncope.  Allergies  Allergen Reactions  . Doxycycline Other (See Comments)    Upset stomach  . Keflex [Cephalexin] Other (See Comments)    Abdominal discomfort 04-19-17 Pt has tolerated orally    Current Outpatient Medications  Medication Sig Dispense Refill  . acetaminophen (TYLENOL) 325 MG tablet Take 2 tablets (650 mg total) by mouth every 6 (six) hours as needed for moderate pain. Total acetaminophen intake not to exceed 4066m total from all sources per 24 hours. 30 tablet 0  . bisacodyl (DULCOLAX) 5 MG EC tablet Take 1 tablet (5 mg total) by mouth daily as needed for moderate constipation. 30 tablet 0  . carbidopa-levodopa (SINEMET IR) 25-100 MG tablet Take 1 tablet by mouth 3 (three) times daily. 90 tablet 5  . HYDROcodone-acetaminophen (NORCO) 5-325 MG tablet Take 1-2 tablets by mouth every 6 (six) hours as needed for moderate pain. MAXIMUM TOTAL ACETAMINOPHEN DOSE IS 4000 MG PER DAY 30 tablet 0  . hydrocortisone cream 1 % Apply 1 application topically daily as needed for itching. 30 g 0  . lisinopril-hydrochlorothiazide (PRINZIDE,ZESTORETIC) 10-12.5 MG tablet  Take 1 tablet by mouth daily. 90 tablet 3  . LUTEIN-ZEAXANTHIN PO Take 1 tablet daily by mouth.     . magnesium citrate solution Take 148 mLs by mouth daily. 300 mL 0  . Misc Natural Products (PROSTATE) CAPS Take 1 tablet daily by mouth. Prostate Plus otc supplement    . Multiple Vitamin (MULTIVITAMIN WITH MINERALS) TABS tablet Take 1 tablet daily by mouth.    . polyethylene glycol (MIRALAX / GLYCOLAX) packet Take 17 g by mouth daily as needed for mild constipation. 14 each 0  . Probiotic Product (PROBIOTIC DAILY PO) Take 1 tablet by mouth daily.    . ranitidine (ZANTAC) 150 MG tablet Take 1 tablet (150 mg total) by mouth daily as needed for heartburn.    . rivaroxaban (XARELTO) 20 MG TABS tablet TAKE ONE TABLET (242m BY MOUTH DAILY WITH SUPPER 30 tablet 1  . sennosides-docusate sodium (SENOKOT-S) 8.6-50 MG tablet Take 2 tablets by mouth daily. 30 tablet 1  . timolol (TIMOPTIC) 0.5 % ophthalmic solution One drop in both eyes in the morning and one drop in the left eye at night. 10 mL 12  . vitamin C (ASCORBIC ACID) 500 MG tablet Take 500 mg by mouth daily.     No current facility-administered medications for this visit.     Past Medical History:  Diagnosis Date  . Arthritis   . Atrial fibrillation ([facility]  . Balance problem 10/31/2013  . BPH (benign prostatic hyperplasia) 2014   Urology Dr  Wrenn with [facility] [phone]   . Bradycardia    a. baseline HR in the 30's. Asymptomatic - no history of PPM placement.   Marland Kitchen CAD (coronary artery disease)    LHC 03/16/11: Mid LAD 10-20%, proximal circumflex 10%, mid RCA 20%, EF 55%.  . Cataract   . Complete heart block ([place])   . Fracture of femoral neck, right ([place]) 04/19/2017  . GERD (gastroesophageal reflux disease)    takes occasional  zantac  . Hx of echocardiogram    Echo 02/2011: EF 65-70%, normal wall motion, MAC, mild MR, mild LAE, mild RVE  . Hypertension   . OSA (obstructive sleep apnea)    per patient went to have sleep study done about 2  years ago, bu they never F/U with him about results; but wife reports he still has periods of apnea at night   . Talar fracture    casted no surg    Past Surgical History:  Procedure Laterality Date  . ABDOMINAL AORTOGRAM W/LOWER EXTREMITY N/A 01/17/2017   Procedure: ABDOMINAL AORTOGRAM W/LOWER EXTREMITY;  Surgeon: Angelia Mould, MD;  Location: [facility];  Service: Cardiovascular;  Laterality: N/A;  . COLONOSCOPY     Repeated and normal in 2011  . FRACTURE SURGERY    . HEMIARTHROPLASTY HIP Right 04/19/2017  . HEMORROIDECTOMY    . HIP ARTHROPLASTY Right 04/19/2017   Procedure: ARTHROPLASTY BIPOLAR HIP (HEMIARTHROPLASTY);  Surgeon: Marchia Bond, MD;  Location: [facility];  Service: Orthopedics;  Laterality: Right;  . INGUINAL HERNIA REPAIR Right 02/26/2014   Procedure: LAPAROSCOPIC RIGHT INGUINAL HERNIA REPAIR;  Surgeon: Ralene Ok, MD;  Location: [facility];  Service: General;  Laterality: Right;  . INGUINAL HERNIA REPAIR    . INSERTION OF MESH Right 02/26/2014   Procedure: INSERTION OF MESH;  Surgeon: Ralene Ok, MD;  Location: [facility];  Service: General;  Laterality: Right;  . LEFT HEART CATHETERIZATION WITH CORONARY ANGIOGRAM N/A 03/16/2011   Procedure: LEFT HEART CATHETERIZATION WITH CORONARY ANGIOGRAM;  Surgeon: Peter M Martinique, MD;  Location: [facility] C[facility];  Service: Cardiovascular;  Laterality: N/A;  . PROSTATE BIOPSY     negative - [facility]  . TRANSURETHRAL RESECTION OF PROSTATE N/A 09/07/2016   Procedure: TRANSURETHRAL RESECTION OF THE PROSTATE (TURP);  Surgeon: Irine Seal, MD;  Location: [facility];  Service: Urology;  Laterality: N/A;    ROS:   As stated in the HPI and negative for all other systems.  PHYSICAL EXAM BP (!) 160/82   Pulse (!) 33   Ht _0  (1.803 m)   Wt 173 lb 9.6 oz (78.7 kg)   BMI 24.21 kg/m   GENERAL:  Well appearing NECK:  No jugular venous distention, waveform within normal limits, carotid upstroke brisk and symmetric, no bruits, no  thyromegaly LUNGS:  Clear to auscultation bilaterally CHEST:  Unremarkable HEART:  PMI not displaced or sustained,S1 and S2 within normal limits, no S3, no S4, no clicks, no rubs, no murmurs ABD:  Flat, positive bowel sounds normal in frequency in pitch, no bruits, no rebound, no guarding, no midline pulsatile mass, no hepatomegaly, no splenomegaly EXT:  2 plus pulses throughout, no edema, no cyanosis no clubbing   EKG: Atrial fibrillation, junctional escape rhythm, rate 33, unchanged from previous.  ASSESSMENT AND PLAN   ATRIAL FIBRILLATION:   Mr. JEVAUN STRICK has a CHA2DS2 - VASc score of 3 with a risk of stroke of 3.2%.  He tolerates anticoagulation .  No change in therapy.  COMPLETE HEART BLOCK: He has had this for many years.  He has no syncope.  There is been no indication for pacing.   HTN: his blood pressure is slightly elevated but this is unusual.  He did not take his medications this morning.  He will keep a blood pressure diary.   NQWMI:    He had minimal coronary disease in 2013 and has had no chest pain since.  No change in therapy.  HIP FRACTURE: Documentation sent to his insurance company which then rejected a longer stay at rehab was incorrect stating that the patient had dementia.  He does absolutely not and has never had delirium.

## 2017-05-23 ENCOUNTER — Ambulatory Visit: Payer: PPO | Admitting: Cardiology

## 2017-05-23 ENCOUNTER — Encounter: Payer: Self-pay | Admitting: Cardiology

## 2017-05-23 VITALS — BP 160/82 | HR 33 | Ht 71.0 in | Wt 173.6 lb

## 2017-05-23 DIAGNOSIS — I482 Chronic atrial fibrillation, unspecified: Secondary | ICD-10-CM

## 2017-05-23 DIAGNOSIS — I442 Atrioventricular block, complete: Secondary | ICD-10-CM | POA: Diagnosis not present

## 2017-05-23 DIAGNOSIS — I10 Essential (primary) hypertension: Secondary | ICD-10-CM | POA: Diagnosis not present

## 2017-05-23 NOTE — Patient Instructions (Signed)
Medication Instructions:  Continue current medications  If you need a refill on your cardiac medications before your next appointment, please call your pharmacy.  Labwork: None Ordered   Testing/Procedures: None Ordered  Follow-Up: Your physician wants you to follow-up in: 1 Year. You should receive a reminder letter in the mail two months in advance. If you do not receive a letter, please call our office [phone].    Thank you for choosing [facility]!!      

## 2017-05-27 DIAGNOSIS — H353211 Exudative age-related macular degeneration, right eye, with active choroidal neovascularization: Secondary | ICD-10-CM | POA: Diagnosis not present

## 2017-05-27 DIAGNOSIS — H25812 Combined forms of age-related cataract, left eye: Secondary | ICD-10-CM | POA: Diagnosis not present

## 2017-05-27 DIAGNOSIS — H25811 Combined forms of age-related cataract, right eye: Secondary | ICD-10-CM | POA: Diagnosis not present

## 2017-05-28 ENCOUNTER — Other Ambulatory Visit: Payer: Self-pay

## 2017-05-28 ENCOUNTER — Ambulatory Visit: Payer: PPO | Admitting: Urgent Care

## 2017-05-28 ENCOUNTER — Encounter: Payer: Self-pay | Admitting: Urgent Care

## 2017-05-28 VITALS — BP 138/62 | HR 46 | Temp 97.6°F | Resp 16 | Ht 71.0 in | Wt 176.6 lb

## 2017-05-28 DIAGNOSIS — M79671 Pain in right foot: Secondary | ICD-10-CM

## 2017-05-28 DIAGNOSIS — L03115 Cellulitis of right lower limb: Secondary | ICD-10-CM | POA: Diagnosis not present

## 2017-05-28 MED ORDER — CEPHALEXIN 500 MG PO CAPS: 500.0000 mg | ORAL_CAPSULE | Freq: Three times a day (TID) | ORAL | 0 refills | Status: DC

## 2017-05-28 NOTE — Progress Notes (Signed)
    MRN: [id number] DOB: 1926/08/03  Subjective:   Chad Russell is a 84 y.o. male presenting for ~1 month history of right heel tenderness over blister. Patient was in physical therapy, developed blister over this area. Has applied dressing but pain is worsening, has some oozing. Denies fever, red streaks, drainage of pus, bleeding. He has taken Keflex well without too many issues. Also had a blister of same type over his left heel that is now resolved.  Chad Russell has a current medication list which includes the following prescription(s): acetaminophen, bisacodyl, carbidopa-levodopa, hydrocortisone cream, lisinopril-hydrochlorothiazide, lutein-zeaxanthin, magnesium citrate, prostate, multivitamin with minerals, probiotic product, ranitidine, rivaroxaban, sennosides-docusate sodium, timolol, vitamin c, hydrocodone-acetaminophen, and polyethylene glycol. Also is allergic to doxycycline and keflex [cephalexin].  Chad Russell  has a past medical history of Arthritis, Atrial fibrillation ([place]), Balance problem (10/31/2013), BPH (benign prostatic hyperplasia) (2014), Bradycardia, CAD (coronary artery disease), Cataract, Complete heart block ([place]), Fracture of femoral neck, right ([place]) (04/19/2017), GERD (gastroesophageal reflux disease), echocardiogram, Hypertension, OSA (obstructive sleep apnea), and Talar fracture. Also  has a past surgical history that includes Colonoscopy; Prostate biopsy; left heart catheterization with coronary angiogram (N/A, 03/16/2011); Inguinal hernia repair (Right, 02/26/2014); Insertion of mesh (Right, 02/26/2014); Transurethral resection of prostate (N/A, 09/07/2016); ABDOMINAL AORTOGRAM W/LOWER EXTREMITY (N/A, 01/17/2017); Hemorroidectomy; Inguinal hernia repair; Hemiarthroplasty hip (Right, 04/19/2017); Fracture surgery; and Hip Arthroplasty (Right, 04/19/2017).  Objective:   Vitals: BP 138/62   Pulse (!) 46   Temp 97.6 F (36.4 C)   Resp 16   Ht 5\' 11"  (1.803 m)   Wt 176 lb 9.6  oz (80.1 kg)   SpO2 97%   BMI 24.63 kg/m   Physical Exam  Constitutional: He is oriented to person, place, and time. He appears well-developed and well-nourished.  Cardiovascular: Normal rate.  Pulmonary/Chest: Effort normal.  Neurological: He is alert and oriented to person, place, and time.  Skin: Skin is warm and dry.     Psychiatric: He has a normal mood and affect.   Assessment and Plan :   Cellulitis of right foot  Right foot pain  Will start Keflex TID. Wound care reviewed. Will follow up in 2 days. Schedule APAP for pain.  Jaynee Eagles, PA-C [facility] at [place] [phone] 05/28/2017  10:40 AM

## 2017-05-28 NOTE — Patient Instructions (Addendum)
You may take 500mg  Tylenol every 6 hours for pain and inflammation. Keep the area clean and dry, air it out while at home and prop your leg up. Cover your wound while sleeping and any time you leave your home.      Cellulitis, Adult Cellulitis is a skin infection. The infected area is usually red and tender. This condition occurs most often in the arms and lower legs. The infection can travel to the muscles, blood, and underlying tissue and become serious. It is very important to get treated for this condition. What are the causes? Cellulitis is caused by bacteria. The bacteria enter through a break in the skin, such as a cut, burn, insect bite, open sore, or crack. What increases the risk? This condition is more likely to occur in people who:  Have a weak defense system (immune system).  Have open wounds on the skin such as cuts, burns, bites, and scrapes. Bacteria can enter the body through these open wounds.  Are older.  Have diabetes.  Have a type of long-lasting (chronic) liver disease (cirrhosis) or kidney disease.  Use IV drugs.  What are the signs or symptoms? Symptoms of this condition include:  Redness, streaking, or spotting on the skin.  Swollen area of the skin.  Tenderness or pain when an area of the skin is touched.  Warm skin.  Fever.  Chills.  Blisters.  How is this diagnosed? This condition is diagnosed based on a medical history and physical exam. You may also have tests, including:  Blood tests.  Lab tests.  Imaging tests.  How is this treated? Treatment for this condition may include:  Medicines, such as antibiotic medicines or antihistamines.  Supportive care, such as rest and application of cold or warm cloths (cold or warm compresses) to the skin.  Hospital care, if the condition is severe.  The infection usually gets better within 1-2 days of treatment. Follow these instructions at home:  Take over-the-counter and prescription  medicines only as told by your health care provider.  If you were prescribed an antibiotic medicine, take it as told by your health care provider. Do not stop taking the antibiotic even if you start to feel better.  Drink enough fluid to keep your urine clear or pale yellow.  Do not touch or rub the infected area.  Raise (elevate) the infected area above the level of your heart while you are sitting or lying down.  Apply warm or cold compresses to the affected area as told by your health care provider.  Keep all follow-up visits as told by your health care provider. This is important. These visits let your health care provider make sure a more serious infection is not developing. Contact a health care provider if:  You have a fever.  Your symptoms do not improve within 1-2 days of starting treatment.  Your bone or joint underneath the infected area becomes painful after the skin has healed.  Your infection returns in the same area or another area.  You notice a swollen bump in the infected area.  You develop new symptoms.  You have a general ill feeling (malaise) with muscle aches and pains. Get help right away if:  Your symptoms get worse.  You feel very sleepy.  You develop vomiting or diarrhea that persists.  You notice red streaks coming from the infected area.  Your red area gets larger or turns dark in color. This information is not intended to replace advice given to  you by your health care provider. Make sure you discuss any questions you have with your health care provider. Document Released: 11/18/2004 Document Revised: 06/19/2015 Document Reviewed: 12/18/2014 Elsevier Interactive Patient Education  2018 [employer].     IF you received an x-ray today, you will receive an invoice from [facility]. Please contact [facility] at [phone] with questions or concerns regarding your invoice.   IF you received labwork today, you will receive  an invoice from [place]. Please contact [facility] at [phone] with questions or concerns regarding your invoice.   Our billing staff will not be able to assist you with questions regarding bills from these companies.  You will be contacted with the lab results as soon as they are available. The fastest way to get your results is to activate your My Chart account. Instructions are located on the last page of this paperwork. If you have not heard from [place] regarding the results in 2 weeks, please contact this office.

## 2017-05-31 ENCOUNTER — Ambulatory Visit: Payer: PPO | Admitting: Urgent Care

## 2017-05-31 ENCOUNTER — Encounter: Payer: Self-pay | Admitting: Urgent Care

## 2017-05-31 VITALS — BP 130/77 | HR 40 | Temp 97.1°F | Resp 18 | Ht 71.0 in

## 2017-05-31 DIAGNOSIS — L03115 Cellulitis of right lower limb: Secondary | ICD-10-CM | POA: Diagnosis not present

## 2017-05-31 DIAGNOSIS — R001 Bradycardia, unspecified: Secondary | ICD-10-CM | POA: Diagnosis not present

## 2017-05-31 DIAGNOSIS — M79671 Pain in right foot: Secondary | ICD-10-CM

## 2017-05-31 NOTE — Progress Notes (Signed)
    MRN: [id number] DOB: November 25, 1926  Subjective:   Chad Russell is a 84 y.o. male presenting for follow up on cellulitis of his right heel. Patient's last OV was 05/28/2017, was started on Keflex, wound care reviewed. Today, reports that he is doing better. He does not have pain. Denies fever, n/v, red streaks. He is taking Keflex twice daily and performing wound care as instructed.    Chad Russell has a current medication list which includes the following prescription(s): acetaminophen, bisacodyl, carbidopa-levodopa, cephalexin, hydrocodone-acetaminophen, hydrocortisone cream, lisinopril-hydrochlorothiazide, lutein-zeaxanthin, magnesium citrate, prostate, multivitamin with minerals, polyethylene glycol, probiotic product, ranitidine, rivaroxaban, sennosides-docusate sodium, timolol, and vitamin c. Also is allergic to doxycycline and keflex [cephalexin].  Chad Russell  has a past medical history of Arthritis, Atrial fibrillation ([place]), Balance problem (10/31/2013), BPH (benign prostatic hyperplasia) (2014), Bradycardia, CAD (coronary artery disease), Cataract, Complete heart block ([place]), Fracture of femoral neck, right ([place]) (04/19/2017), GERD (gastroesophageal reflux disease), echocardiogram, Hypertension, OSA (obstructive sleep apnea), and Talar fracture. Also  has a past surgical history that includes Colonoscopy; Prostate biopsy; left heart catheterization with coronary angiogram (N/A, 03/16/2011); Inguinal hernia repair (Right, 02/26/2014); Insertion of mesh (Right, 02/26/2014); Transurethral resection of prostate (N/A, 09/07/2016); ABDOMINAL AORTOGRAM W/LOWER EXTREMITY (N/A, 01/17/2017); Hemorroidectomy; Inguinal hernia repair; Hemiarthroplasty hip (Right, 04/19/2017); Fracture surgery; and Hip Arthroplasty (Right, 04/19/2017).  Objective:   Vitals: BP 130/77   Pulse (!) 40   Temp (!) 97.1 F (36.2 C) (Axillary)   Resp 18   Ht 5\' 11"  (1.803 m)   SpO2 93%   BMI 24.63 kg/m   Physical Exam    Constitutional: He is oriented to person, place, and time. He appears well-developed and well-nourished.  Cardiovascular: Normal rate.  Pulmonary/Chest: Effort normal.  Neurological: He is alert and oriented to person, place, and time.  Skin:       Assessment and Plan :   Cellulitis of right foot  Right foot pain  Bradycardia  Improving, maintain Keflex and wound care. Return-to-clinic precautions discussed, patient verbalized understanding. Follow up in 2 weeks if symptoms persist.   Jaynee Eagles, PA-C [facility] [phone] 05/31/2017 1:57 PM

## 2017-05-31 NOTE — Patient Instructions (Addendum)
You may take 500mg  Tylenol every 6 hours for pain and inflammation. Keep the area clean and dry, air it out while at home and prop your leg up. Cover your wound while sleeping and any time you leave your home.      Cellulitis, Adult Cellulitis is a skin infection. The infected area is usually red and tender. This condition occurs most often in the arms and lower legs. The infection can travel to the muscles, blood, and underlying tissue and become serious. It is very important to get treated for this condition. What are the causes? Cellulitis is caused by bacteria. The bacteria enter through a break in the skin, such as a cut, burn, insect bite, open sore, or crack. What increases the risk? This condition is more likely to occur in people who:  Have a weak defense system (immune system).  Have open wounds on the skin such as cuts, burns, bites, and scrapes. Bacteria can enter the body through these open wounds.  Are older.  Have diabetes.  Have a type of long-lasting (chronic) liver disease (cirrhosis) or kidney disease.  Use IV drugs.  What are the signs or symptoms? Symptoms of this condition include:  Redness, streaking, or spotting on the skin.  Swollen area of the skin.  Tenderness or pain when an area of the skin is touched.  Warm skin.  Fever.  Chills.  Blisters.  How is this diagnosed? This condition is diagnosed based on a medical history and physical exam. You may also have tests, including:  Blood tests.  Lab tests.  Imaging tests.  How is this treated? Treatment for this condition may include:  Medicines, such as antibiotic medicines or antihistamines.  Supportive care, such as rest and application of cold or warm cloths (cold or warm compresses) to the skin.  Hospital care, if the condition is severe.  The infection usually gets better within 1-2 days of treatment. Follow these instructions at home:  Take over-the-counter and prescription  medicines only as told by your health care provider.  If you were prescribed an antibiotic medicine, take it as told by your health care provider. Do not stop taking the antibiotic even if you start to feel better.  Drink enough fluid to keep your urine clear or pale yellow.  Do not touch or rub the infected area.  Raise (elevate) the infected area above the level of your heart while you are sitting or lying down.  Apply warm or cold compresses to the affected area as told by your health care provider.  Keep all follow-up visits as told by your health care provider. This is important. These visits let your health care provider make sure a more serious infection is not developing. Contact a health care provider if:  You have a fever.  Your symptoms do not improve within 1-2 days of starting treatment.  Your bone or joint underneath the infected area becomes painful after the skin has healed.  Your infection returns in the same area or another area.  You notice a swollen bump in the infected area.  You develop new symptoms.  You have a general ill feeling (malaise) with muscle aches and pains. Get help right away if:  Your symptoms get worse.  You feel very sleepy.  You develop vomiting or diarrhea that persists.  You notice red streaks coming from the infected area.  Your red area gets larger or turns dark in color. This information is not intended to replace advice given to  you by your health care provider. Make sure you discuss any questions you have with your health care provider. Document Released: 11/18/2004 Document Revised: 06/19/2015 Document Reviewed: 12/18/2014 Elsevier Interactive Patient Education  2018 [employer].     IF you received an x-ray today, you will receive an invoice from [facility]. Please contact [facility] at [phone] with questions or concerns regarding your invoice.   IF you received labwork today, you will receive  an invoice from [facility]. Please contact [facility] at [phone] with questions or concerns regarding your invoice.   Our billing staff will not be able to assist you with questions regarding bills from these companies.  You will be contacted with the lab results as soon as they are available. The fastest way to get your results is to activate your My Chart account. Instructions are located on the last page of this paperwork. If you have not heard from [place] regarding the results in 2 weeks, please contact this office.

## 2017-06-01 ENCOUNTER — Other Ambulatory Visit: Payer: Self-pay

## 2017-06-01 DIAGNOSIS — S72001D Fracture of unspecified part of neck of right femur, subsequent encounter for closed fracture with routine healing: Secondary | ICD-10-CM | POA: Diagnosis not present

## 2017-06-06 DIAGNOSIS — H353113 Nonexudative age-related macular degeneration, right eye, advanced atrophic without subfoveal involvement: Secondary | ICD-10-CM | POA: Diagnosis not present

## 2017-06-06 DIAGNOSIS — H401131 Primary open-angle glaucoma, bilateral, mild stage: Secondary | ICD-10-CM | POA: Diagnosis not present

## 2017-06-06 DIAGNOSIS — H353211 Exudative age-related macular degeneration, right eye, with active choroidal neovascularization: Secondary | ICD-10-CM | POA: Diagnosis not present

## 2017-06-06 DIAGNOSIS — H353221 Exudative age-related macular degeneration, left eye, with active choroidal neovascularization: Secondary | ICD-10-CM | POA: Diagnosis not present

## 2017-06-07 ENCOUNTER — Telehealth: Payer: Self-pay

## 2017-06-07 NOTE — Telephone Encounter (Signed)
Patient's wife Mardene Celeste called to cancel appointment on Wednesday 06/08/17. Stated they do not wish to reschedule at this time and will call the office if they decide to reschedule.

## 2017-06-08 ENCOUNTER — Ambulatory Visit: Payer: Self-pay | Admitting: Hematology and Oncology

## 2017-06-22 DIAGNOSIS — H2512 Age-related nuclear cataract, left eye: Secondary | ICD-10-CM | POA: Diagnosis not present

## 2017-06-22 DIAGNOSIS — H25812 Combined forms of age-related cataract, left eye: Secondary | ICD-10-CM | POA: Diagnosis not present

## 2017-06-24 ENCOUNTER — Encounter: Payer: Self-pay | Admitting: Neurology

## 2017-06-24 ENCOUNTER — Ambulatory Visit: Payer: PPO | Admitting: Neurology

## 2017-06-24 VITALS — BP 108/68 | HR 38 | Ht 71.0 in | Wt 169.5 lb

## 2017-06-24 DIAGNOSIS — G2589 Other specified extrapyramidal and movement disorders: Secondary | ICD-10-CM

## 2017-06-24 DIAGNOSIS — G20 Parkinson's disease: Secondary | ICD-10-CM

## 2017-06-24 MED ORDER — CARBIDOPA-LEVODOPA 25-100 MG PO TABS: 1.0000 | ORAL_TABLET | Freq: Three times a day (TID) | ORAL | 3 refills | Status: DC

## 2017-06-24 NOTE — Progress Notes (Signed)
Follow-up Visit   Date: 06/24/17    Chad Russell MRN: [id number] DOB: 02-03-1927   Interim History: Chad Russell is a 84 y.o. right-handed Caucasian male with atrial fibrillation on anticoagulation therapy, hypertension, and BPH returning to the clinic for follow-up of neuropathy and gait imbalance.  The patient was accompanied to the clinic by self.  History of present illness: Starting around 2010, he began having unsteadiness when walking.  He recalls that it started suddenly after working in the yard and developing a rash to poison ivy, but he also noticed two distinct areas, which he thought were spider bites. Within a few hours, he developed visual distortion as if objects were moving slowly.  This visual disturbance improved, but since this time, he feels that his balance has always been off.  He walks independently and has not suffered any falls. His wife has also noticed that he tends to walk heavy with his feet.  His wife feels that his voice has become softer, movements are slower, and penmanship has become poor.   He drinks one glass of wine nightly. 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    In July 2018, he underwent TURP due to urinary retention from BPH. He reports having up to mild to severe related to Foley placement and prostate issues, however is pleased with the results of his surgery.  His gait remains unchanged with shuffling pattern and wife reports mild dragging of the left foot. Sometimes, she has noticed a tremor when his arms are outstretched, but not when resting.   UPDATE 02/25/2017:  He is here for follow-up visit.  At his last visit, I recommended starting sinemet for parkinsonian features, but he did not tolerate the medication, because it made him feel "unwell", but admits to not being on it long enough to appreciate any benefit.  He is interested in restarting this, if necessary.   He denies any worsening of numbness in the feet.  He had not had any falls and no longer has any low back pain.  He was able to see Dr. Lebron Conners with hematology for monoclonal gammopathy and has undergone extensive work-up including PET scan and bone marrow biopsy (results pending).  He also had a poorly healing left foot lesion and because of his arterial disease underwent arteriography of the legs which showed PAD but adequate circulation of the left foot.   UPDATE 06/24/2017:  He is here for follow-up visit. He has not noticed any change in symptoms since taking sinemet 1 tablet three times daily. He suffered a fall in February and fractured his right femoral neck which required surgery.  He has recovered very well.   He was doing well and enjoyed physical therapy, but was unable to continue this because insurance denied extra length of stay.  He has been using a walker since this time.  He has a left cataract surgery two days ago.  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  No problems with memory.  He manages his own finances, medications, and continues to drive.   Medications:  Current Outpatient Medications on File Prior to Visit  Medication Sig Dispense Refill  . acetaminophen (TYLENOL) 325 MG tablet Take 2 tablets (650 mg total) by mouth every 6 (six) hours as needed for moderate pain. Total acetaminophen intake not to exceed 4022m total from all sources per 24 hours. 30 tablet 0  .  bisacodyl (DULCOLAX) 5 MG EC tablet Take 1 tablet (5 mg total) by mouth daily as needed for moderate constipation. 30 tablet 0  . cephALEXin (KEFLEX) 500 MG capsule Take 1 capsule (500 mg total) by mouth 3 (three) times daily. (Patient taking differently: Take 500 mg by mouth 2 (two) times daily. ) 21 capsule 0  . hydrocortisone cream 1 % Apply 1 application topically daily as needed for itching. 30 g 0  . lisinopril-hydrochlorothiazide (PRINZIDE,ZESTORETIC) 10-12.5 MG tablet Take 1 tablet by mouth  daily. 90 tablet 3  . LUTEIN-ZEAXANTHIN PO Take 1 tablet daily by mouth.     . magnesium citrate solution Take 148 mLs by mouth daily. 300 mL 0  . Misc Natural Products (PROSTATE) CAPS Take 1 tablet daily by mouth. Prostate Plus otc supplement    . Multiple Vitamin (MULTIVITAMIN WITH MINERALS) TABS tablet Take 1 tablet daily by mouth.    . Probiotic Product (PROBIOTIC DAILY PO) Take 1 tablet by mouth daily.    . ranitidine (ZANTAC) 150 MG tablet Take 1 tablet (150 mg total) by mouth daily as needed for heartburn.    . rivaroxaban (XARELTO) 20 MG TABS tablet TAKE ONE TABLET (89m) BY MOUTH DAILY WITH SUPPER 30 tablet 1  . timolol (TIMOPTIC) 0.5 % ophthalmic solution One drop in both eyes in the morning and one drop in the left eye at night. 10 mL 12  . vitamin C (ASCORBIC ACID) 500 MG tablet Take 500 mg by mouth daily.     No current facility-administered medications on file prior to visit.     Allergies:  Allergies  Allergen Reactions  . Doxycycline Other (See Comments)    Upset stomach  . Keflex [Cephalexin] Other (See Comments)    Abdominal discomfort 04-19-17 Pt has tolerated orally    Review of Systems:  CONSTITUTIONAL: No fevers, chills, night sweats, or weight loss.  EYES: No visual changes or eye pain ENT: No hearing changes.  No history of nose bleeds.   RESPIRATORY: No cough, wheezing and shortness of breath.   CARDIOVASCULAR: Negative for chest pain, and palpitations.   GI: Negative for abdominal discomfort, blood in stools or black stools.  No recent change in bowel habits.   MUSCLOSKELETAL: No history of joint pain or swelling.  No myalgias.   SKIN: Negative for lesions, rash, and itching.   ENDOCRINE: Negative for cold or heat intolerance, polydipsia or goiter.   PSYCH:  No depression or anxiety symptoms.   NEURO: As Above.   Vital Signs:  BP 108/68   Pulse (!) 38   Ht _0  (1.803 m)   Wt 169 lb 8 oz (76.9 kg)   SpO2 98%   BMI 23.64 kg/m   Gen.: Well  appearing, comfortable, blunted affect, reduced blink  Neurological Exam: MENTAL STATUS including orientation to time, place, person, recent and remote memory, attention span and concentration, language, and fund of knowledge is normal.  Speech is soft, not dysarthric, loss of prosody.  CRANIAL NERVES:  Pupils equal round and reactive to light.  Normal conjugate, extra-ocular eye movements in all directions of gaze, except mildly restricted upgaze. Subtle left ptosis.  Face is symmetric.     MOTOR:  Generalized loss of muscle bulk. Motor strength is 5/5 throughout, except mild 5-/5 bilateral toe extension and dorsiflexion.  There is no tremor on exam.  Cogwheel rigidity is present in the R > LUE  SENSORY:  Vibration is reduced at the knees and absent distal to ankles.  REFLEXES: Reflexes remain 2+/4 in the upper extremities, 3+/4 with spread at the patella and 2+/4 at the ankles.  COORDINATION/GAIT:   There is bradykinesia with reduced rate and amplitude of finger and toe tapping, worse on the right.  Gait shows small, shuffling steps,assisted with walker  Data: NCS/EMG of the legs 12/30/2015: The electrophysiologic findings are most consistent with a distal and symmetric sensorimotor polyneuropathy, axon loss in type, affecting the lower extremities.   Labs 12/29/2015:  SPEP with IFE IgG lambda monoclonal band, vitamin B1 60, vitamin B12 1209, copper, TSH 3.9   IMPRESSION/PLAN: 1.  Akinetic-rigid disease manifesting with bradykinesia, shuffling gait; exam with blunted affect, soft speech, cogwheel rigidity, and shuffling gait. Continue sinemet 1 tablet three times daily.  Continue home exercises.  Fall precautions discussed.   2.  Peripheral axonal neuropathy contributed by peripheral arterial disease. Stable and without painful paresthesia.    3.  Probable lumbosacral central canal stenosis without neurogenic claudication. He is asymptomatic, if he develops low back pain or leg weakness, he  will need MRI lumbar spine.  4.  IgG lambda monoclonal gammopathy, appreciate Dr. Clydene Laming evaluation.    5.  No evidence of cognitive impairment.  Mr. Chong is cognitively intact and high functioning able to perform all IADLs.  No signs if dementia and probably one of the smartest 44 year-olds that I know.   Return to clinic in 4 months  Greater than 50% of this 25 minute visit was spent in counseling, explanation of diagnosis, planning of further management, and coordination of care.   Thank you for allowing me to participate in patient's care.  If I can answer any additional questions, I would be pleased to do so.    Sincerely,    Donika K. Posey Pronto, DO

## 2017-06-24 NOTE — Patient Instructions (Signed)
Continue sinemet 1 tablet at 8am, 2pm, and 7pm.   Return to clinic in 4 months

## 2017-06-29 DIAGNOSIS — E559 Vitamin D deficiency, unspecified: Secondary | ICD-10-CM | POA: Diagnosis not present

## 2017-06-29 DIAGNOSIS — S72001D Fracture of unspecified part of neck of right femur, subsequent encounter for closed fracture with routine healing: Secondary | ICD-10-CM | POA: Diagnosis not present

## 2017-06-29 DIAGNOSIS — M810 Age-related osteoporosis without current pathological fracture: Secondary | ICD-10-CM | POA: Diagnosis not present

## 2017-06-29 DIAGNOSIS — R5383 Other fatigue: Secondary | ICD-10-CM | POA: Diagnosis not present

## 2017-07-01 DIAGNOSIS — M8589 Other specified disorders of bone density and structure, multiple sites: Secondary | ICD-10-CM | POA: Diagnosis not present

## 2017-07-07 DIAGNOSIS — M810 Age-related osteoporosis without current pathological fracture: Secondary | ICD-10-CM | POA: Diagnosis not present

## 2017-07-11 DIAGNOSIS — H2511 Age-related nuclear cataract, right eye: Secondary | ICD-10-CM | POA: Diagnosis not present

## 2017-07-13 DIAGNOSIS — H25811 Combined forms of age-related cataract, right eye: Secondary | ICD-10-CM | POA: Diagnosis not present

## 2017-07-13 DIAGNOSIS — H2511 Age-related nuclear cataract, right eye: Secondary | ICD-10-CM | POA: Diagnosis not present

## 2017-07-19 DIAGNOSIS — H3521 Other non-diabetic proliferative retinopathy, right eye: Secondary | ICD-10-CM | POA: Diagnosis not present

## 2017-07-19 DIAGNOSIS — H3562 Retinal hemorrhage, left eye: Secondary | ICD-10-CM | POA: Diagnosis not present

## 2017-07-19 DIAGNOSIS — H3582 Retinal ischemia: Secondary | ICD-10-CM | POA: Diagnosis not present

## 2017-07-19 DIAGNOSIS — H3561 Retinal hemorrhage, right eye: Secondary | ICD-10-CM | POA: Diagnosis not present

## 2017-07-21 ENCOUNTER — Other Ambulatory Visit: Payer: Self-pay | Admitting: Cardiology

## 2017-07-26 DIAGNOSIS — H3582 Retinal ischemia: Secondary | ICD-10-CM | POA: Diagnosis not present

## 2017-07-26 DIAGNOSIS — H3522 Other non-diabetic proliferative retinopathy, left eye: Secondary | ICD-10-CM | POA: Diagnosis not present

## 2017-08-17 DIAGNOSIS — H3522 Other non-diabetic proliferative retinopathy, left eye: Secondary | ICD-10-CM | POA: Diagnosis not present

## 2017-08-17 DIAGNOSIS — H3582 Retinal ischemia: Secondary | ICD-10-CM | POA: Diagnosis not present

## 2017-08-17 DIAGNOSIS — H34811 Central retinal vein occlusion, right eye, with macular edema: Secondary | ICD-10-CM | POA: Diagnosis not present

## 2017-09-29 DIAGNOSIS — H3522 Other non-diabetic proliferative retinopathy, left eye: Secondary | ICD-10-CM | POA: Diagnosis not present

## 2017-09-29 DIAGNOSIS — H353211 Exudative age-related macular degeneration, right eye, with active choroidal neovascularization: Secondary | ICD-10-CM | POA: Diagnosis not present

## 2017-09-29 DIAGNOSIS — H3521 Other non-diabetic proliferative retinopathy, right eye: Secondary | ICD-10-CM | POA: Diagnosis not present

## 2017-09-29 DIAGNOSIS — H34811 Central retinal vein occlusion, right eye, with macular edema: Secondary | ICD-10-CM | POA: Diagnosis not present

## 2017-10-04 DIAGNOSIS — Z961 Presence of intraocular lens: Secondary | ICD-10-CM | POA: Diagnosis not present

## 2017-10-26 ENCOUNTER — Telehealth: Payer: Self-pay | Admitting: Family Medicine

## 2017-10-26 NOTE — Telephone Encounter (Signed)
Wife in office - requests podiatry eval - discolored toenails. Prior blisters after rehab, none now but has callouses in areas. Some discoloration of toe that may be better now. Last ov in January.  Has had some fatigue.   Recommended scheduling appt to be seen in next week -10 days if possible to assess above conditions and decide on referrals.

## 2017-10-27 ENCOUNTER — Encounter: Payer: Self-pay | Admitting: Family Medicine

## 2017-10-27 ENCOUNTER — Other Ambulatory Visit: Payer: Self-pay

## 2017-10-27 ENCOUNTER — Ambulatory Visit: Payer: PPO | Admitting: Family Medicine

## 2017-10-27 ENCOUNTER — Ambulatory Visit (INDEPENDENT_AMBULATORY_CARE_PROVIDER_SITE_OTHER): Payer: PPO

## 2017-10-27 VITALS — BP 140/70 | HR 55 | Temp 97.5°F | Ht 71.0 in | Wt 164.0 lb

## 2017-10-27 DIAGNOSIS — Z23 Encounter for immunization: Secondary | ICD-10-CM

## 2017-10-27 DIAGNOSIS — L03031 Cellulitis of right toe: Secondary | ICD-10-CM | POA: Diagnosis not present

## 2017-10-27 DIAGNOSIS — L988 Other specified disorders of the skin and subcutaneous tissue: Secondary | ICD-10-CM | POA: Diagnosis not present

## 2017-10-27 DIAGNOSIS — M7989 Other specified soft tissue disorders: Secondary | ICD-10-CM

## 2017-10-27 DIAGNOSIS — B351 Tinea unguium: Secondary | ICD-10-CM | POA: Diagnosis not present

## 2017-10-27 MED ORDER — CLOTRIMAZOLE 1 % EX CREA: 1.0000 "application " | TOPICAL_CREAM | Freq: Two times a day (BID) | CUTANEOUS | 0 refills | Status: DC

## 2017-10-27 NOTE — Patient Instructions (Addendum)
  Clotrimazole antifungal cream between the toes twice per day for now, try to allow air to circulate to the toes after soap and water cleansing once per day.  I will refer you to podiatry for evaluation of that area as well. Return to the clinic or go to the nearest emergency room if any of your symptoms worsen or new symptoms occur.  Please follow-up in the next few weeks to review your chronic medical history and discuss any other specific concerns.   Thank you for coming in today.    If you have lab work done today you will be contacted with your lab results within the next 2 weeks.  If you have not heard from [place] then please contact us. The fastest way to get your results is to register for My Chart.   IF you received an x-ray today, you will receive an invoice from [facility]. Please contact [facility] at [phone] with questions or concerns regarding your invoice.   IF you received labwork today, you will receive an invoice from [facility]. Please contact [facility] at [phone] with questions or concerns regarding your invoice.   Our billing staff will not be able to assist you with questions regarding bills from these companies.  You will be contacted with the lab results as soon as they are available. The fastest way to get your results is to activate your My Chart account. Instructions are located on the last page of this paperwork. If you have not heard from [place] regarding the results in 2 weeks, please contact this office.

## 2017-10-27 NOTE — Progress Notes (Signed)
Subjective:  By signing my name below, I, [name], attest that this documentation has been prepared under the direction and in the presence of [name], MD Electronically Signed: Ladene Artist, ED Scribe 10/27/2017 at 11:05 AM.   Patient ID: Chad Russell, male    DOB: 04/04/1926, 84 y.o.   MRN: [id number]  Chief Complaint  Patient presents with  . Nail Problem   HPI Chad Russell is a 84 y.o. male who presents to Primary Care at [facility] complaining of toenail issues. Regular pt of mine. Wife discussed concerns yesterday at her visit. 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 Peripheral artery disease. Treated for fractured femoral neck fx in 03/2017 requiring surgery. Has been having ongoing fatigue, plans to f/u to discuss symptoms more at routine visit.  R Great Toenail Pt states that he has been having difficulty caring for/clipping his great toenails due to thickened toenails. He has noticed some redness to the R great toe which his wife states has improved some. Denies discharge or pus, pain, injury. He has never seen podiatry.  Pt states things have been stable; denies fatigue but wife states he goes to sleep if he sits down.  Patient Active Problem List   Diagnosis Date Noted  . Fracture of femoral neck, right ([place]) 04/19/2017  . MGUS (monoclonal gammopathy of unknown significance) 01/15/2017  . BPH with obstruction/lower urinary tract symptoms 09/07/2016  . Preoperative cardiovascular examination 08/24/2016  . Urinary retention 05/13/2016  . Balance problem 10/31/2013  . OSA (obstructive sleep apnea) 10/31/2013  . BPH (benign prostatic hyperplasia) 08/28/2012  . Atrial fibrillation ([place]) 06/08/2012  . Hyperplasia of prostate with lower urinary tract symptoms (LUTS) 04/28/2012  . Moderate malnutrition ([place]) 04/28/2012  . Infection of urinary tract - recurrent 04/26/2012  . NSTEMI (non-ST elevated myocardial infarction)  ([place]) 03/15/2011  . Complete heart block ([place]) 03/15/2011  . HTN (hypertension) 03/15/2011   Past Medical History:  Diagnosis Date  . Arthritis   . Atrial fibrillation ([place])   . Balance problem 10/31/2013  . BPH (benign prostatic hyperplasia) 2014   Urology Dr [name] with [facility] [phone]   . Bradycardia    a. baseline HR in the 30's. Asymptomatic - no history of PPM placement.   Marland Kitchen CAD (coronary artery disease)    LHC 03/16/11: Mid LAD 10-20%, proximal circumflex 10%, mid RCA 20%, EF 55%.  . Cataract   . Complete heart block ([place])   . Fracture of femoral neck, right ([place]) 04/19/2017  . GERD (gastroesophageal reflux disease)    takes occasional  zantac  . Hx of echocardiogram    Echo 02/2011: EF 65-70%, normal wall motion, MAC, mild MR, mild LAE, mild RVE  . Hypertension   . OSA (obstructive sleep apnea)    per patient went to have sleep study done about 2 years ago, bu they never F/U with him about results; but wife reports he still has periods of apnea at night   . Talar fracture    casted no surg   Past Surgical History:  Procedure Laterality Date  . ABDOMINAL AORTOGRAM W/LOWER EXTREMITY N/A 01/17/2017   Procedure: ABDOMINAL AORTOGRAM W/LOWER EXTREMITY;  Surgeon: Angelia Mould, MD;  Location: [facility];  Service: Cardiovascular;  Laterality: N/A;  . COLONOSCOPY     Repeated and normal in 2011  . FRACTURE SURGERY    . HEMIARTHROPLASTY HIP Right 04/19/2017  . HEMORROIDECTOMY    . HIP ARTHROPLASTY  Right 04/19/2017   Procedure: ARTHROPLASTY BIPOLAR HIP (HEMIARTHROPLASTY);  Surgeon: Marchia Bond, MD;  Location: [facility];  Service: Orthopedics;  Laterality: Right;  . INGUINAL HERNIA REPAIR Right 02/26/2014   Procedure: LAPAROSCOPIC RIGHT INGUINAL HERNIA REPAIR;  Surgeon: Ralene Ok, MD;  Location: [facility];  Service: General;  Laterality: Right;  . INGUINAL HERNIA REPAIR    . INSERTION OF MESH Right 02/26/2014   Procedure: INSERTION OF MESH;  Surgeon: Ralene Ok,  MD;  Location: [facility];  Service: General;  Laterality: Right;  . LEFT HEART CATHETERIZATION WITH CORONARY ANGIOGRAM N/A 03/16/2011   Procedure: LEFT HEART CATHETERIZATION WITH CORONARY ANGIOGRAM;  Surgeon: Peter M Martinique, MD;  Location: [facility] C[facility] LAB;  Service: Cardiovascular;  Laterality: N/A;  . PROSTATE BIOPSY     negative - [facility]  . TRANSURETHRAL RESECTION OF PROSTATE N/A 09/07/2016   Procedure: TRANSURETHRAL RESECTION OF THE PROSTATE (TURP);  Surgeon: Irine Seal, MD;  Location: [facility];  Service: Urology;  Laterality: N/A;   Allergies  Allergen Reactions  . Doxycycline Other (See Comments)    Upset stomach  . Keflex [Cephalexin] Other (See Comments)    Abdominal discomfort 04-19-17 Pt has tolerated orally   Prior to Admission medications   Medication Sig Start Date End Date Taking? Authorizing Provider  acetaminophen (TYLENOL) 325 MG tablet Take 2 tablets (650 mg total) by mouth every 6 (six) hours as needed for moderate pain. Total acetaminophen intake not to exceed 4036m total from all sources per 24 hours. 04/22/17   SAlphonzo Grieve MD  bisacodyl (DULCOLAX) 5 MG EC tablet Take 1 tablet (5 mg total) by mouth daily as needed for moderate constipation. 04/19/17   LMarchia Bond MD  carbidopa-levodopa (SINEMET IR) 25-100 MG tablet Take 1 tablet by mouth 3 (three) times daily. 06/24/17   Patel, DArvin CollardK, DO  cephALEXin (KEFLEX) 500 MG capsule Take 1 capsule (500 mg total) by mouth 3 (three) times daily. Patient taking differently: Take 500 mg by mouth 2 (two) times daily.  05/28/17   MJaynee Eagles PA-C  hydrocortisone cream 1 % Apply 1 application topically daily as needed for itching. 04/22/17   SAlphonzo Grieve MD  lisinopril-hydrochlorothiazide (PRINZIDE,ZESTORETIC) 10-12.5 MG tablet Take 1 tablet by mouth daily. 04/22/17   SAlphonzo Grieve MD  LUTEIN-ZEAXANTHIN PO Take 1 tablet daily by mouth.     [provider]  magnesium citrate solution Take 148 mLs by mouth daily. 04/22/17    SAlphonzo Grieve MD  Misc Natural Products (PROSTATE) CAPS Take 1 tablet daily by mouth. Prostate Plus otc supplement    [provider]  Multiple Vitamin (MULTIVITAMIN WITH MINERALS) TABS tablet Take 1 tablet daily by mouth.    [provider]  Probiotic Product (PROBIOTIC DAILY PO) Take 1 tablet by mouth daily.    [provider]  ranitidine (ZANTAC) 150 MG tablet Take 1 tablet (150 mg total) by mouth daily as needed for heartburn. 04/22/17   SAlphonzo Grieve MD  timolol (TIMOPTIC) 0.5 % ophthalmic solution One drop in both eyes in the morning and one drop in the left eye at night. 04/22/17   SAlphonzo Grieve MD  vitamin C (ASCORBIC ACID) 500 MG tablet Take 500 mg by mouth daily.    [provider]  XARELTO 20 MG TABS tablet TAKE ONE TABLET BY MOUTH DAILY WITH SUPPER 07/21/17   HMinus Breeding MD   Social History   Socioeconomic History  . Marital status: Married    Spouse name: PMardene Celeste . Number of  children: 0  . Years of education: 17  . Highest education level: Not on file  Occupational History  . Occupation: Scientist, product/process development  . Occupation: Civil engineer, contracting  Social Needs  . Financial resource strain: Not on file  . Food insecurity:    Worry: Not on file    Inability: Not on file  . Transportation needs:    Medical: Not on file    Non-medical: Not on file  Tobacco Use  . Smoking status: Former Smoker    Packs/day: 1.00    Years: 3.00    Pack years: 3.00    Types: Cigarettes    Last attempt to quit: 03/17/1955    Years since quitting: 62.6  . Smokeless tobacco: Former Network engineer and Sexual Activity  . Alcohol use: Yes    Alcohol/week: 14.0 standard drinks    Types: 7 Cans of beer, 7 Standard drinks or equivalent per week    Comment: daily  . Drug use: No  . Sexual activity: Never    Birth control/protection: None  Lifestyle  . Physical activity:    Days per week: Not on file    Minutes per session: Not on file  . Stress: Not on file   Relationships  . Social connections:    Talks on phone: Not on file    Gets together: Not on file    Attends religious service: Not on file    Active member of club or organization: Not on file    Attends meetings of clubs or organizations: Not on file    Relationship status: Not on file  . Intimate partner violence:    Fear of current or ex partner: Not on file    Emotionally abused: Not on file    Physically abused: Not on file    Forced sexual activity: Not on file  Other Topics Concern  . 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  Has no children.     Review of Systems  Skin: Positive for color change (R great toe/toenails).      Objective:   Physical Exam  Constitutional: He is oriented to person, place, and time. He appears well-developed and well-nourished. No distress.  HENT:  Head: Normocephalic and atraumatic.  Eyes: Conjunctivae and EOM are normal.  Neck: Neck supple. No tracheal deviation present.  Cardiovascular: Normal rate.  Pulmonary/Chest: Effort normal. No respiratory distress.  Musculoskeletal: Normal range of motion.  Neurological: He is alert and oriented to person, place, and time.  Skin: Skin is warm and dry.  Slight slow cap refill 1-2 seconds at toes. Sensation intact distally. Yellow discoloration of nails on L foot with thickened toenail primarily great toe which is turned inward on lateral aspect but surrounding nail folds are nontender. No erythema or edema. R foot: cap refill 1-2 seconds. Sensation intact. Thickened discolored nails throughout R foot, primarily great toe with some component of ingrown/turned nail laterally. No sig amount of lateral nail fold edema. Soft tissue swelling of proximal nailfold of great toe. Swelling about great toe from MTP. Some  erythema and slight skin breakdown on lateral great toe and interdigital skin between 1st/2nd, 2nd/3rd and 4th/5th with slight wet appearance. Some slight scaling along distal 2nd and 3rd toes without open wounds.  Small dry skin along plantar 4th metatarsal head, nontender.  Psychiatric: He has a normal mood and affect. His behavior is normal.  Nursing note and vitals reviewed.  Vitals:   10/27/17 1044  BP: 140/70  Pulse: (!) 55  Temp: (!) 97.5 F (36.4 C)  TempSrc: Oral  SpO2: 97%  Weight: 164 lb (74.4 kg)  Height: _0  (1.803 m)  Dg Toe Great Right  Result Date: 10/27/2017 CLINICAL DATA:  Acute RIGHT great toe swelling and pain. No known injury. Initial encounter. EXAM: RIGHT GREAT TOE COMPARISON:  None. FINDINGS: No acute fracture, subluxation or dislocation. No radiographic evidence of osteomyelitis noted. An equivocal erosion of the MEDIAL 1st metatarsal head noted. No other focal bony abnormalities identified. Vascular calcifications are present. IMPRESSION: 1. No acute fracture or osteomyelitis. 2. Equivocal erosion of the MEDIAL 1st metatarsal head. Correlate clinically with possibility of gout. Electronically Signed   By: Margarette Canada M.D.   On: 10/27/2017 12:06       Assessment & Plan:   Chad Russell is a 84 y.o. male Onychomycosis - Plan: Ambulatory referral to Podiatry, clotrimazole (LOTRIMIN) 1 % cream Chronic paronychia of toe of right foot - Plan: Ambulatory referral to Podiatry, clotrimazole (LOTRIMIN) 1 % cream Toe swelling - Plan: DG Toe Great Right, Ambulatory referral to Podiatry, clotrimazole (LOTRIMIN) 1 % cream Maceration of skin  -Suspected onychomycosis with some possible chronic paronychia of the great toe.  X-ray without signs of fracture but possible changes from gout.    -Refer to podiatry for ongoing foot care, clotrimazole for possible fungal component around the nail and interdigital with interdigital skin care discussed.  Need for influenza  vaccination - Plan: Flu vaccine HIGH DOSE PF (Fluzone High dose)   Meds ordered this encounter  Medications  . clotrimazole (LOTRIMIN) 1 % cream    Sig: Apply 1 application topically 2 (two) times daily.    Dispense:  30 g    Refill:  0   Patient Instructions    Clotrimazole antifungal cream between the toes twice per day for now, try to allow air to circulate to the toes after soap and water cleansing once per day.  I will refer you to podiatry for evaluation of that area as well. Return to the clinic or go to the nearest emergency room if any of your symptoms worsen or new symptoms occur.  Please follow-up in the next few weeks to review your chronic medical history and discuss any other specific concerns.   Thank you for coming in today.    If you have lab work done today you will be contacted with your lab results within the next 2 weeks.  If you have not heard from [place] then please contact us. The fastest way to get your results is to register for My Chart.   IF you received an x-ray today, you will receive an invoice from [facility]. Please contact [facility] at [phone] with questions or concerns regarding your invoice.   IF you received labwork today, you will receive an invoice from [facility]. Please contact [facility] at [phone] with questions or concerns regarding your invoice.   Our billing staff will not be able to assist you with questions regarding bills from these companies.  You will be contacted with the lab results as soon as they are available. The fastest way to get your results is to activate your My Chart account. Instructions are located on the last page of this paperwork. If you  have not heard from [place] regarding the results in 2 weeks, please contact this office.       I personally performed the services described in this documentation, which was scribed in my presence. The recorded information has been reviewed and considered for  accuracy and completeness, addended by me as needed, and agree with information above.  Signed,   [name], MD Primary Care at [facility].  10/29/17 11:37 AM

## 2017-10-31 DIAGNOSIS — H34811 Central retinal vein occlusion, right eye, with macular edema: Secondary | ICD-10-CM | POA: Diagnosis not present

## 2017-10-31 DIAGNOSIS — H3561 Retinal hemorrhage, right eye: Secondary | ICD-10-CM | POA: Diagnosis not present

## 2017-11-07 ENCOUNTER — Ambulatory Visit: Payer: Self-pay | Admitting: Neurology

## 2017-11-17 ENCOUNTER — Other Ambulatory Visit: Payer: Self-pay

## 2017-11-17 ENCOUNTER — Ambulatory Visit: Payer: PPO | Admitting: Family Medicine

## 2017-11-17 ENCOUNTER — Encounter: Payer: Self-pay | Admitting: Family Medicine

## 2017-11-17 VITALS — BP 136/74 | HR 35 | Temp 98.1°F | Ht 71.0 in | Wt 165.4 lb

## 2017-11-17 DIAGNOSIS — L03032 Cellulitis of left toe: Secondary | ICD-10-CM

## 2017-11-17 DIAGNOSIS — L03031 Cellulitis of right toe: Secondary | ICD-10-CM | POA: Diagnosis not present

## 2017-11-17 MED ORDER — SULFAMETHOXAZOLE-TRIMETHOPRIM 800-160 MG PO TABS: 1.0000 | ORAL_TABLET | Freq: Two times a day (BID) | ORAL | 0 refills | Status: DC

## 2017-11-17 NOTE — Patient Instructions (Addendum)
I suspect some chronic paronychia of the toe but it appears to have an acute component or possible bacterial component for now.  However I do not see an abscess to cut into or drain today.  Cleanse area with soap and water twice per day, okay to use antifungal cream, but start antibiotic 1 pill twice per day.  If not improving in the next 3 to 4 days or spreading redness/worsening symptoms, recheck sooner.  For chronic toe and toenail issues I would still recommend calling podiatry to be seen as few weeks if possible.  Return to the clinic or go to the nearest emergency room if any of your symptoms worsen or new symptoms occur.  Paronychia Paronychia is an infection of the skin that surrounds a nail. It usually affects the skin around a fingernail, but it may also occur near a toenail. It often causes pain and swelling around the nail. This condition may come on suddenly or develop over a longer period. In some cases, a collection of pus (abscess) can form near or under the nail. Usually, paronychia is not serious and it clears up with treatment. What are the causes? This condition may be caused by bacteria or fungi. It is commonly caused by either Streptococcus or Staphylococcus bacteria. The bacteria or fungi often cause the infection by getting into the affected area through an opening in the skin, such as a cut or a hangnail. What increases the risk? This condition is more likely to develop in:  People who get their hands wet often, such as those who work as Designer, industrial/product, bartenders, or nurses.  People who bite their fingernails or suck their thumbs.  People who trim their nails too short.  People who have hangnails or injured fingertips.  People who get manicures.  People who have diabetes.  What are the signs or symptoms? Symptoms of this condition include:  Redness and swelling of the skin near the nail.  Tenderness around the nail when you touch the area.  Pus-filled bumps  under the cuticle. The cuticle is the skin at the base or sides of the nail.  Fluid or pus under the nail.  Throbbing pain in the area.  How is this diagnosed? This condition is usually diagnosed with a physical exam. In some cases, a sample of pus may be taken from an abscess to be tested in a lab. This can help to determine what type of bacteria or fungi is causing the condition. How is this treated? Treatment for this condition depends on the cause and severity of the condition. If the condition is mild, it may clear up on its own in a few days. Your health care provider may recommend soaking the affected area in warm water a few times a day. When treatment is needed, the options may include:  Antibiotic medicine, if the condition is caused by a bacterial infection.  Antifungal medicine, if the condition is caused by a fungal infection.  Incision and drainage, if an abscess is present. In this procedure, the health care provider will cut open the abscess so the pus can drain out.  Follow these instructions at home:  Soak the affected area in warm water if directed to do so by your health care provider. You may be told to do this for 20 minutes, 2-3 times a day. Keep the area dry in between soakings.  Take medicines only as directed by your health care provider.  If you were prescribed an antibiotic medicine, finish all  of it even if you start to feel better.  Keep the affected area clean.  Do not try to drain a fluid-filled bump yourself.  If you will be washing dishes or performing other tasks that require your hands to get wet, wear rubber gloves. You should also wear gloves if your hands might come in contact with irritating substances, such as cleaners or chemicals.  Follow your health care provider's instructions about: ? Wound care. ? Bandage (dressing) changes and removal. Contact a health care provider if:  Your symptoms get worse or do not improve with  treatment.  You have a fever or chills.  You have redness spreading from the affected area.  You have continued or increased fluid, blood, or pus coming from the affected area.  Your finger or knuckle becomes swollen or is difficult to move. This information is not intended to replace advice given to you by your health care provider. Make sure you discuss any questions you have with your health care provider. Document Released: 08/04/2000 Document Revised: 07/17/2015 Document Reviewed: 01/16/2014 Elsevier Interactive Patient Education  [employer].   If you have lab work done today you will be contacted with your lab results within the next 2 weeks.  If you have not heard from [place] then please contact us. The fastest way to get your results is to register for My Chart.   IF you received an x-ray today, you will receive an invoice from [facility] Radiology. Please contact [facility] Radiology at [phone] with questions or concerns regarding your invoice.   IF you received labwork today, you will receive an invoice from [facility]. Please contact [facility] at [phone] with questions or concerns regarding your invoice.   Our billing staff will not be able to assist you with questions regarding bills from these companies.  You will be contacted with the lab results as soon as they are available. The fastest way to get your results is to activate your My Chart account. Instructions are located on the last page of this paperwork. If you have not heard from [place] regarding the results in 2 weeks, please contact this office.

## 2017-11-17 NOTE — Progress Notes (Signed)
Subjective:  By signing my name below, I, Chad [facility], attest that this documentation has been prepared under the direction and in the presence of Chad Agreste, MD Electronically Signed: Ladene Artist, ED Scribe 11/17/2017 at 12:32 PM.   Patient ID: Chad [facility], male    DOB: 1927/01/14, 84 y.o.   MRN: [id number]  Chief Complaint  Patient presents with  . Recheck Toe    Recheck    HPI  Chad [facility] is a 84 y.o. male who presents to Primary Care at [facility] for f/u on R great toenail. Seen 9/5. Having issues with nail care due to onychomycosis, however, R great toe had some redness but wife reported was improving. No discharge. Suspected some chronic paronychia. Some questionable erosion of medial first metatarsal head but no fracture of osteomyelitis signs. Treated with clotrimazole for poss fungal component around nail and skin care discussed for interdigital skin. Referred to podiatry.  Pt states that R great toenail now has increased swelling to the R great toe and and pus from the top of the toenail x 3 days. Reports he only has occasional pain with putting on certain shoes. Denies fever, chills. Pt did receive a call from podiatry but he wanted to defer appointment until after his wife's eye procedure. Allergy to Keflex and doxycyline.  Patient Active Problem List   Diagnosis Date Noted  . Fracture of femoral neck, right ([place]) 04/19/2017  . MGUS (monoclonal gammopathy of unknown significance) 01/15/2017  . BPH with obstruction/lower urinary tract symptoms 09/07/2016  . Preoperative cardiovascular examination 08/24/2016  . Urinary retention 05/13/2016  . Balance problem 10/31/2013  . OSA (obstructive sleep apnea) 10/31/2013  . BPH (benign prostatic hyperplasia) 08/28/2012  . Atrial fibrillation ([place]) 06/08/2012  . Hyperplasia of prostate with lower urinary tract symptoms (LUTS) 04/28/2012  . Moderate malnutrition ([place]) 04/28/2012  . Infection of urinary tract -  recurrent 04/26/2012  . NSTEMI (non-ST elevated myocardial infarction) ([place]) 03/15/2011  . Complete heart block ([place]) 03/15/2011  . HTN (hypertension) 03/15/2011   Past Medical History:  Diagnosis Date  . Arthritis   . Atrial fibrillation ([place])   . Balance problem 10/31/2013  . BPH (benign prostatic hyperplasia) 2014   Urology Dr [name] with [facility] [phone]   . Bradycardia    a. baseline HR in the 30's. Asymptomatic - no history of PPM placement.   Marland Kitchen CAD (coronary artery disease)    LHC 03/16/11: Mid LAD 10-20%, proximal circumflex 10%, mid RCA 20%, EF 55%.  . Cataract   . Complete heart block ([place])   . Fracture of femoral neck, right ([place]) 04/19/2017  . GERD (gastroesophageal reflux disease)    takes occasional  zantac  . Hx of echocardiogram    Echo 02/2011: EF 65-70%, normal wall motion, MAC, mild MR, mild LAE, mild RVE  . Hypertension   . OSA (obstructive sleep apnea)    per patient went to have sleep study done about 2 years ago, bu they never F/U with him about results; but wife reports he still has periods of apnea at night   . Talar fracture    casted no surg   Past Surgical History:  Procedure Laterality Date  . ABDOMINAL AORTOGRAM W/LOWER EXTREMITY N/A 01/17/2017   Procedure: ABDOMINAL AORTOGRAM W/LOWER EXTREMITY;  Surgeon: Angelia Mould, MD;  Location: [facility];  Service: Cardiovascular;  Laterality: N/A;  . COLONOSCOPY     Repeated and normal in 2011  . FRACTURE SURGERY    .  HEMIARTHROPLASTY HIP Right 04/19/2017  . HEMORROIDECTOMY    . HIP ARTHROPLASTY Right 04/19/2017   Procedure: ARTHROPLASTY BIPOLAR HIP (HEMIARTHROPLASTY);  Surgeon: Marchia Bond, MD;  Location: [facility];  Service: Orthopedics;  Laterality: Right;  . INGUINAL HERNIA REPAIR Right 02/26/2014   Procedure: LAPAROSCOPIC RIGHT INGUINAL HERNIA REPAIR;  Surgeon: Ralene Ok, MD;  Location: [facility];  Service: General;  Laterality: Right;  . INGUINAL HERNIA REPAIR    . INSERTION OF MESH  Right 02/26/2014   Procedure: INSERTION OF MESH;  Surgeon: Ralene Ok, MD;  Location: [facility];  Service: General;  Laterality: Right;  . LEFT HEART CATHETERIZATION WITH CORONARY ANGIOGRAM N/A 03/16/2011   Procedure: LEFT HEART CATHETERIZATION WITH CORONARY ANGIOGRAM;  Surgeon: Peter M Martinique, MD;  Location: [facility] CATH LAB;  Service: Cardiovascular;  Laterality: N/A;  . PROSTATE BIOPSY     negative - [facility]  . TRANSURETHRAL RESECTION OF PROSTATE N/A 09/07/2016   Procedure: TRANSURETHRAL RESECTION OF THE PROSTATE (TURP);  Surgeon: Irine Seal, MD;  Location: [facility];  Service: Urology;  Laterality: N/A;   Allergies  Allergen Reactions  . Doxycycline Other (See Comments)    Upset stomach  . Keflex [Cephalexin] Other (See Comments)    Abdominal discomfort 04-19-17 Pt has tolerated orally   Prior to Admission medications   Medication Sig Start Date End Date Taking? Authorizing Provider  acetaminophen (TYLENOL) 325 MG tablet Take 2 tablets (650 mg total) by mouth every 6 (six) hours as needed for moderate pain. Total acetaminophen intake not to exceed 4052m total from all sources per 24 hours. 04/22/17   SAlphonzo Grieve MD  bisacodyl (DULCOLAX) 5 MG EC tablet Take 1 tablet (5 mg total) by mouth daily as needed for moderate constipation. 04/19/17   LMarchia Bond MD  carbidopa-levodopa (SINEMET IR) 25-100 MG tablet Take 1 tablet by mouth 3 (three) times daily. 06/24/17   Patel, DArvin CollardK, DO  cephALEXin (KEFLEX) 500 MG capsule Take 1 capsule (500 mg total) by mouth 3 (three) times daily. Patient taking differently: Take 500 mg by mouth 2 (two) times daily.  05/28/17   MJaynee Eagles PA-C  clotrimazole (LOTRIMIN) 1 % cream Apply 1 application topically 2 (two) times daily. 10/27/17   GWendie Agreste MD  hydrocortisone cream 1 % Apply 1 application topically daily as needed for itching. 04/22/17   SAlphonzo Grieve MD  lisinopril-hydrochlorothiazide (PRINZIDE,ZESTORETIC) 10-12.5 MG tablet Take 1 tablet  by mouth daily. 04/22/17   SAlphonzo Grieve MD  LUTEIN-ZEAXANTHIN PO Take 1 tablet daily by mouth.     [provider]  magnesium citrate solution Take 148 mLs by mouth daily. 04/22/17   SAlphonzo Grieve MD  Misc Natural Products (PROSTATE) CAPS Take 1 tablet daily by mouth. Prostate Plus otc supplement    [provider]  Multiple Vitamin (MULTIVITAMIN WITH MINERALS) TABS tablet Take 1 tablet daily by mouth.    [provider]  Probiotic Product (PROBIOTIC DAILY PO) Take 1 tablet by mouth daily.    [provider]  ranitidine (ZANTAC) 150 MG tablet Take 1 tablet (150 mg total) by mouth daily as needed for heartburn. 04/22/17   SAlphonzo Grieve MD  timolol (TIMOPTIC) 0.5 % ophthalmic solution One drop in both eyes in the morning and one drop in the left eye at night. 04/22/17   SAlphonzo Grieve MD  vitamin C (ASCORBIC ACID) 500 MG tablet Take 500 mg by mouth daily.    [provider]  XARELTO 20 MG TABS tablet TAKE ONE TABLET  BY MOUTH DAILY WITH SUPPER 07/21/17   Minus Breeding, MD   Social History   Socioeconomic History  . Marital status: Married    Spouse name: Mardene Celeste  . Number of children: 0  . Years of education: 28  . Highest education level: Not on file  Occupational History  . Occupation: Scientist, product/process development  . Occupation: Civil engineer, contracting  Social Needs  . Financial resource strain: Not on file  . Food insecurity:    Worry: Not on file    Inability: Not on file  . Transportation needs:    Medical: Not on file    Non-medical: Not on file  Tobacco Use  . Smoking status: Former Smoker    Packs/day: 1.00    Years: 3.00    Pack years: 3.00    Types: Cigarettes    Last attempt to quit: 03/17/1955    Years since quitting: 62.7  . Smokeless tobacco: Former Network engineer and Sexual Activity  . Alcohol use: Yes    Alcohol/week: 14.0 standard drinks    Types: 7 Cans of beer, 7 Standard drinks or equivalent per week    Comment: daily  . Drug  use: No  . Sexual activity: Never    Birth control/protection: None  Lifestyle  . Physical activity:    Days per week: Not on file    Minutes per session: Not on file  . Stress: Not on file  Relationships  . Social connections:    Talks on phone: Not on file    Gets together: Not on file    Attends religious service: Not on file    Active member of club or organization: Not on file    Attends meetings of clubs or organizations: Not on file    Relationship status: Not on file  . Intimate partner violence:    Fear of current or ex partner: Not on file    Emotionally abused: Not on file    Physically abused: Not on file    Forced sexual activity: Not on file  Other Topics Concern  . 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  Has no children.     Review of Systems  Constitutional: Negative for chills and fever.  Musculoskeletal: Positive for joint swelling.      Objective:   Physical Exam  Constitutional: He is oriented to person, place, and time. He appears well-developed and well-nourished. No distress.  HENT:  Head: Normocephalic and atraumatic.  Eyes: Conjunctivae and EOM are normal.  Neck: Neck supple. No tracheal deviation present.  Cardiovascular: Normal rate.  Pulmonary/Chest: Effort normal. No respiratory distress.  Musculoskeletal: Normal range of motion.  Neurological: He is alert and oriented to person, place, and time.  Skin: Skin is warm and dry.  R foot: Minimal wet appear between 4th and 5th toes, skin intact. Thickened in-turned great toenail with some swelling of the medial nailfold. Lateral nailfold appears intact. Proximal nailfold is edematous with soft tissue swelling to base of great toe. Some yellow exudate sitting at base of nail, unable to express additional exudate  with pressure on proximal nailfold. Slight erythema to mid great toe dorsally only. Callus at plantar great toe without ulceration. Slight peeling of great toe and 2nd toe.  Psychiatric: He  has a normal mood and affect. His behavior is normal.  Nursing note and vitals reviewed.   Vitals:   11/17/17 1200  BP: 136/74  Pulse: (!) 35  Temp: 98.1 F (36.7 C)  TempSrc: Oral  SpO2: 99%  Weight: 165 lb 6.4 oz (75 kg)  Height: 5' 11"  (1.803 m)      Assessment & Plan:   VICKEY EWBANK is a 84 y.o. male Chronic paronychia of toe, right - Plan: sulfamethoxazole-trimethoprim (BACTRIM DS,SEPTRA DS) 800-160 MG tablet  Acute paronychia of toe of left foot - Plan: sulfamethoxazole-trimethoprim (BACTRIM DS,SEPTRA DS) 800-160 MG tablet, WOUND CULTURE  Chronic paronychia now with acute paronychia secondarily, possible bacterial contributor at this time.  -Okay to continue antifungal treatment as interdigital skin appears improved  -Add Septra DS twice daily, cleansing/wound care discussed  -No apparent abscess for incision/drainage at this time but RTC precautions discussed.  -Stressed importance of podiatry follow-up as recommended last visit  -RTC precautions given  Meds ordered this encounter  Medications  . sulfamethoxazole-trimethoprim (BACTRIM DS,SEPTRA DS) 800-160 MG tablet    Sig: Take 1 tablet by mouth 2 (two) times daily.    Dispense:  20 tablet    Refill:  0   Patient Instructions   I suspect some chronic paronychia of the toe but it appears to have an acute component or possible bacterial component for now.  However I do not see an abscess to cut into or drain today.  Cleanse area with soap and water twice per day, okay to use antifungal cream, but start antibiotic 1 pill twice per day.  If not improving in the next 3 to 4 days or spreading redness/worsening symptoms, recheck sooner.  For chronic toe and toenail issues I would still recommend calling podiatry to be seen as few  weeks if possible.  Return to the clinic or go to the nearest emergency room if any of your symptoms worsen or new symptoms occur.  Paronychia Paronychia is an infection of the skin that surrounds a nail. It usually affects the skin around a fingernail, but it may also occur near a toenail. It often causes pain and swelling around the nail. This condition may come on suddenly or develop over a longer period. In some cases, a collection of pus (abscess) can form near or under the nail. Usually, paronychia is not serious and it clears up with treatment. What are the causes? This condition may be caused by bacteria or fungi. It is commonly caused by either Streptococcus or Staphylococcus bacteria. The bacteria or fungi often cause the infection by getting into the affected area through an opening in the skin, such as a cut or a hangnail. What increases the risk? This condition is more likely to develop in:  People who get their hands wet often, such as those who work as Designer, industrial/product, bartenders, or nurses.  People who bite their fingernails or suck their thumbs.  People who trim their nails too short.  People who have hangnails or injured fingertips.  People who get manicures.  People who have diabetes.  What are the signs or symptoms? Symptoms of this condition include:  Redness and swelling of the skin near the nail.  Tenderness around the nail when you touch the area.  Pus-filled bumps under the cuticle. The cuticle is the skin at the base or sides of the nail.  Fluid or pus under the nail.  Throbbing pain in the area.  How is this diagnosed? This condition is  usually diagnosed with a physical exam. In some cases, a sample of pus may be taken from an abscess to be tested in a lab. This can help to determine what type of bacteria or fungi is causing the condition. How is this treated? Treatment for this condition depends on the cause and severity of the condition. If the  condition is mild, it may clear up on its own in a few days. Your health care provider may recommend soaking the affected area in warm water a few times a day. When treatment is needed, the options may include:  Antibiotic medicine, if the condition is caused by a bacterial infection.  Antifungal medicine, if the condition is caused by a fungal infection.  Incision and drainage, if an abscess is present. In this procedure, the health care provider will cut open the abscess so the pus can drain out.  Follow these instructions at home:  Soak the affected area in warm water if directed to do so by your health care provider. You may be told to do this for 20 minutes, 2-3 times a day. Keep the area dry in between soakings.  Take medicines only as directed by your health care provider.  If you were prescribed an antibiotic medicine, finish all of it even if you start to feel better.  Keep the affected area clean.  Do not try to drain a fluid-filled bump yourself.  If you will be washing dishes or performing other tasks that require your hands to get wet, wear rubber gloves. You should also wear gloves if your hands might come in contact with irritating substances, such as cleaners or chemicals.  Follow your health care provider's instructions about: ? Wound care. ? Bandage (dressing) changes and removal. Contact a health care provider if:  Your symptoms get worse or do not improve with treatment.  You have a fever or chills.  You have redness spreading from the affected area.  You have continued or increased fluid, blood, or pus coming from the affected area.  Your finger or knuckle becomes swollen or is difficult to move. This information is not intended to replace advice given to you by your health care provider. Make sure you discuss any questions you have with your health care provider. Document Released: 08/04/2000 Document Revised: 07/17/2015 Document Reviewed:  01/16/2014 Elsevier Interactive Patient Education  [employer].   If you have lab work done today you will be contacted with your lab results within the next 2 weeks.  If you have not heard from [place] then please contact us. The fastest way to get your results is to register for My Chart.   IF you received an x-ray today, you will receive an invoice from [facility]. Please contact [facility] at [phone] with questions or concerns regarding your invoice.   IF you received labwork today, you will receive an invoice from [facility]. Please contact [facility] at [phone] with questions or concerns regarding your invoice.   Our billing staff will not be able to assist you with questions regarding bills from these companies.  You will be contacted with the lab results as soon as they are available. The fastest way to get your results is to activate your My Chart account. Instructions are located on the last page of this paperwork. If you have not heard from [place] regarding the results in 2 weeks, please contact this office.       I personally performed the services described in this documentation, which  was scribed in my presence. The recorded information has been reviewed and considered for accuracy and completeness, addended by me as needed, and agree with information above.  Signed,   [name], MD [facility] at [facility].  11/19/17 8:05 AM

## 2017-11-19 ENCOUNTER — Encounter: Payer: Self-pay | Admitting: Family Medicine

## 2017-11-19 ENCOUNTER — Ambulatory Visit (INDEPENDENT_AMBULATORY_CARE_PROVIDER_SITE_OTHER): Payer: PPO | Admitting: Podiatry

## 2017-11-19 ENCOUNTER — Encounter: Payer: Self-pay | Admitting: Podiatry

## 2017-11-19 DIAGNOSIS — M79674 Pain in right toe(s): Secondary | ICD-10-CM | POA: Diagnosis not present

## 2017-11-19 DIAGNOSIS — B351 Tinea unguium: Secondary | ICD-10-CM

## 2017-11-19 DIAGNOSIS — M79675 Pain in left toe(s): Secondary | ICD-10-CM

## 2017-11-19 DIAGNOSIS — L03031 Cellulitis of right toe: Secondary | ICD-10-CM | POA: Diagnosis not present

## 2017-11-19 NOTE — Progress Notes (Signed)
This patient presents to the office for an evaluation and treatment of his long nails.  He presents to the office with his wife.  His wife says that he fell and had a hip injury and was in treatment.  She says they never looked at his nails.  She states that is he was evaluated and treated by Dr. Carlota Raspberry on 09/26.  Patient was treated with a culture and sensitivity and prescribed Septra DS for the infection.  He had diagnosed him as having a paronychia right big toe.  He presents the office today to follow-up for his an infection right big toe.   General Appearance  Alert, conversant and in no acute stress.  Vascular  Dorsalis pedis and posterior tibial  pulses are palpable  bilaterally.  Capillary return is within normal limits  bilaterally. Temperature is within normal limits  bilaterally.  Neurologic  Senn-Weinstein monofilament wire test within normal limits  bilaterally. Muscle power within normal limits bilaterally.  Nails Thick disfigured discolored nails with subungual debris  from hallux to fifth toes bilaterally. No evidence of bacterial infection or drainage bilaterally.  Is no evidence of any redness swelling or granulation tissue noted right hallux is.  Orthopedic  No limitations of motion  feet .  No crepitus or effusions noted.  No bony pathology or digital deformities noted.  Skin  normotropic skin with no porokeratosis noted bilaterally.  No signs of infections or ulcers noted.    S/P paronychia is right hal islux  Onychomycosis  B/l  IE. Debridement  of nails  X 10.  Paronychia has healed.  Patient was toild to continue on antibiotics.  RTC 3 months for preventative foot care services   Gardiner Barefoot DPM

## 2017-11-20 LAB — WOUND CULTURE: Organism ID, Bacteria: NONE SEEN

## 2017-11-22 ENCOUNTER — Encounter: Payer: Self-pay | Admitting: *Deleted

## 2017-12-05 DIAGNOSIS — H3582 Retinal ischemia: Secondary | ICD-10-CM | POA: Diagnosis not present

## 2017-12-05 DIAGNOSIS — H353211 Exudative age-related macular degeneration, right eye, with active choroidal neovascularization: Secondary | ICD-10-CM | POA: Diagnosis not present

## 2017-12-05 DIAGNOSIS — H34811 Central retinal vein occlusion, right eye, with macular edema: Secondary | ICD-10-CM | POA: Diagnosis not present

## 2017-12-05 DIAGNOSIS — H34812 Central retinal vein occlusion, left eye, with macular edema: Secondary | ICD-10-CM | POA: Diagnosis not present

## 2017-12-05 DIAGNOSIS — H3522 Other non-diabetic proliferative retinopathy, left eye: Secondary | ICD-10-CM | POA: Diagnosis not present

## 2017-12-05 DIAGNOSIS — H3521 Other non-diabetic proliferative retinopathy, right eye: Secondary | ICD-10-CM | POA: Diagnosis not present

## 2017-12-15 ENCOUNTER — Ambulatory Visit: Payer: PPO | Admitting: Podiatry

## 2017-12-22 ENCOUNTER — Other Ambulatory Visit: Payer: Self-pay | Admitting: *Deleted

## 2017-12-22 MED ORDER — RIVAROXABAN 20 MG PO TABS: 20.0000 mg | ORAL_TABLET | Freq: Every day | ORAL | 3 refills | Status: DC

## 2017-12-22 NOTE — Telephone Encounter (Signed)
Xarelto rx print out and give to pt

## 2018-01-05 DIAGNOSIS — H3561 Retinal hemorrhage, right eye: Secondary | ICD-10-CM | POA: Diagnosis not present

## 2018-01-05 DIAGNOSIS — H34811 Central retinal vein occlusion, right eye, with macular edema: Secondary | ICD-10-CM | POA: Diagnosis not present

## 2018-01-05 DIAGNOSIS — H353211 Exudative age-related macular degeneration, right eye, with active choroidal neovascularization: Secondary | ICD-10-CM | POA: Diagnosis not present

## 2018-01-13 ENCOUNTER — Ambulatory Visit (INDEPENDENT_AMBULATORY_CARE_PROVIDER_SITE_OTHER): Payer: PPO | Admitting: Neurology

## 2018-01-13 ENCOUNTER — Encounter: Payer: Self-pay | Admitting: Neurology

## 2018-01-13 VITALS — BP 124/78 | HR 30 | Ht 71.0 in | Wt 165.0 lb

## 2018-01-13 DIAGNOSIS — G20 Parkinson's disease: Secondary | ICD-10-CM

## 2018-01-13 DIAGNOSIS — R292 Abnormal reflex: Secondary | ICD-10-CM

## 2018-01-13 DIAGNOSIS — G609 Hereditary and idiopathic neuropathy, unspecified: Secondary | ICD-10-CM | POA: Diagnosis not present

## 2018-01-13 NOTE — Progress Notes (Signed)
Follow-up Visit   Date: 01/13/18    Chad Russell MRN: [id number] DOB: [birth year]   Interim History: Chad Russell is a 84 y.o. right-handed Caucasian male with atrial fibrillation on anticoagulation therapy, hypertension, and BPH s/p TURP returning to the clinic for follow-up of neuropathy and gait imbalance.  The patient was accompanied to the clinic by self.  History of present illness: Starting around 2010, he began having unsteadiness when walking. He walks independently and has not suffered any falls. His wife has also noticed that he tends to walk heavy with his feet.  His wife feels that his voice has become softer, movements are slower, and penmanship has become poor.  His gait remains unchanged with shuffling pattern and wife reports mild dragging of the left foot. Sometimes, she has noticed a tremor when his arms are outstretched, but not when resting.  He drinks one glass of wine nightly. 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    He was offered a trial of sinemet in the fall of 2018 which he only took briefly, reporting it made him feel "unwell."  He saw Dr. Lebron Conners with hematology for monoclonal gammopathy and has undergone extensive work-up including PET scan and bone marrow biopsy. He also had a poorly healing left foot lesion and because of his arterial disease underwent arteriography of the legs which showed PAD but adequate circulation of the left foot.   In early 2019, he restarted sinemet 1 tablet three times daily. He suffered a fall in February and fractured his right femoral neck which required surgery.   UPDATE 01/13/2018:  He is here for follow-up visit.  He self-discontinued sinemet two months ago after not noticing marked change in his overall health.  However, upon further questioning, wife has noticed that he is shuffling his legs more when walking.  His movements remain slow, she notes  that it takes longer for him to eat.  Overall, appetite is low and he has lost some weight.  No problems with swallowing.  Mood and sleep is good.  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  No problems with memory.  He manages his own finances, medications, and continues to drive.   Medications:  Current Outpatient Medications on File Prior to Visit  Medication Sig Dispense Refill  . acetaminophen (TYLENOL) 325 MG tablet Take 2 tablets (650 mg total) by mouth every 6 (six) hours as needed for moderate pain. Total acetaminophen intake not to exceed 4032m total from all sources per 24 hours. 30 tablet 0  . bisacodyl (DULCOLAX) 5 MG EC tablet Take 1 tablet (5 mg total) by mouth daily as needed for moderate constipation. 30 tablet 0  . clotrimazole (LOTRIMIN) 1 % cream Apply 1 application topically 2 (two) times daily. 30 g 0  . hydrocortisone cream 1 % Apply 1 application topically daily as needed for itching. 30 g 0  . lisinopril-hydrochlorothiazide (PRINZIDE,ZESTORETIC) 10-12.5 MG tablet Take 1 tablet by mouth daily. 90 tablet 3  . LUTEIN-ZEAXANTHIN PO Take 1 tablet daily by mouth.     . magnesium citrate solution Take 148 mLs by mouth daily. 300 mL 0  . Misc Natural Products (PROSTATE) CAPS Take 1 tablet daily by mouth. Prostate Plus otc supplement    . Multiple Vitamin (MULTIVITAMIN WITH MINERALS) TABS tablet Take 1 tablet daily by  mouth.    . Probiotic Product (PROBIOTIC DAILY PO) Take 1 tablet by mouth daily.    . rivaroxaban (XARELTO) 20 MG TABS tablet Take 1 tablet (20 mg total) by mouth daily with supper. 90 tablet 3  . timolol (TIMOPTIC) 0.5 % ophthalmic solution One drop in both eyes in the morning and one drop in the left eye at night. (Patient taking differently: Place 2 drops into both eyes. One drop in both eyes in the morning and one drop in the left eye at night.) 10 mL 12  . vitamin C  (ASCORBIC ACID) 500 MG tablet Take 500 mg by mouth daily.    . carbidopa-levodopa (SINEMET IR) 25-100 MG tablet Take 1 tablet by mouth 3 (three) times daily. (Patient not taking: Reported on 01/13/2018) 270 tablet 3   No current facility-administered medications on file prior to visit.     Allergies:  Allergies  Allergen Reactions  . Doxycycline Other (See Comments)    Upset stomach  . Keflex [Cephalexin] Other (See Comments)    Abdominal discomfort 04-19-17 Pt has tolerated orally    Review of Systems:  CONSTITUTIONAL: No fevers, chills, night sweats, or weight loss.  EYES: No visual changes or eye pain ENT: No hearing changes.  No history of nose bleeds.   RESPIRATORY: No cough, wheezing and shortness of breath.   CARDIOVASCULAR: Negative for chest pain, and palpitations.   GI: Negative for abdominal discomfort, blood in stools or black stools.  No recent change in bowel habits.   MUSCLOSKELETAL: No history of joint pain or swelling.  No myalgias.   SKIN: Negative for lesions, rash, and itching.   ENDOCRINE: Negative for cold or heat intolerance, polydipsia or goiter.   PSYCH:  No depression or anxiety symptoms.   NEURO: As Above.   Vital Signs:  BP 124/78   Pulse (!) 30   Ht 5' 11"  (1.803 m)   Wt 165 lb (74.8 kg)   SpO2 98%   BMI 23.01 kg/m   General Medical Exam:   General:  Well appearing, comfortable, severely blunted affect Eyes/ENT: see cranial nerve examination.   Neck:   No carotid bruits. Respiratory:  Clear to auscultation, good air entry bilaterally.   Cardiac:  Regular rate and rhythm, no murmur.   Ext:  No edema  Neurological Exam: MENTAL STATUS including orientation to time, place, person, recent and remote memory, attention span and concentration, language, and fund of knowledge is normal.  Speech is soft, not dysarthric, loss of prosody.  CRANIAL NERVES:  Pupils equal round and reactive to light.  Normal conjugate, extra-ocular eye movements in all  directions of gaze, except mildly restricted upgaze. Subtle left ptosis.  Face is symmetric.   Reduced blink.   MOTOR:  Generalized loss of muscle bulk. Motor strength is 5/5 throughout, except trace weakness distally.  There is no tremor on exam.  Cogwheel rigidity is present in the R > LUE  SENSORY:  Vibration is reduced at the knees and absent distal to ankles.  REFLEXES: Reflexes remain 2+/4 in the upper extremities, 3+/4 with spread at the patella and 2+/4 at the ankles.  COORDINATION/GAIT:   There is bradykinesia with reduced rate and amplitude of finger and toe tapping, worse on the right.  Gait shows small, shuffling steps, assisted with two hiking sticks. He is unable to stand up from low chair without using arms.  Data: NCS/EMG of the legs 12/30/2015: The electrophysiologic findings are most consistent with a distal and symmetric  sensorimotor polyneuropathy, axon loss in type, affecting the lower extremities.   Labs 12/29/2015:  SPEP with IFE IgG lambda monoclonal band, vitamin B1 60, vitamin B12 1209, copper, TSH 3.9   IMPRESSION/PLAN: 1.  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  His exam shows severely blunted affect, soft speech, cogwheel rigidity, and shuffling gait. He self-discontinued sinemet due to no improvement, and it was explained that he may not see a marked benefit because tremors are not predominant, but that his muscle tone and gait may improve.  He is agreeable to restart sinemet 1 tablet three times daily. I will also refer him for out-patient LSVT.  Fall precautions discussed.   2.  Peripheral axonal neuropathy contributed by peripheral arterial disease. Stable and without painful paresthesia.    3.  Probable lumbosacral central canal stenosis without neurogenic claudication. I offered MRI lumbar spine to assess for compressive myelopathy which may be contributing to his gait difficulty, however, after discussing potential  management change based on the results of the test, he decided to hold off on imaging.  He does not have low back pain, leg weakness, or exertional leg pain.  He would like to do PT instead and does not have any interest in surgery, even if there was severe canal stenosis.  Return to clinic in 4 months  Greater than 50% of this 30 minute visit was spent in counseling, explanation of diagnosis, planning of further management, and coordination of care.    Thank you for allowing me to participate in patient's care.  If I can answer any additional questions, I would be pleased to do so.    Sincerely,    Donika K. Posey Pronto, DO

## 2018-01-13 NOTE — Patient Instructions (Addendum)
Start Carbidopa Levodopa as follows at least 30-min prior to meals:     AM  Afternoon   Evening   Week 1:  1/2 tab  1/2 tab   1/2 tab  Week 2:   1/2 tab  1/2 tab   1 tab  Week 3:  1/2 tab  1 tab   1 tab  Week 4:  1 tab  1 tab   1 tab  *Avoid taking with protein products, such as milk, meat, cheese  *if you develop nausea, take with crackers  Start Parkinson's Disease physical therapy  Return to clinic 4 months

## 2018-01-23 ENCOUNTER — Telehealth: Payer: Self-pay | Admitting: *Deleted

## 2018-01-23 NOTE — Telephone Encounter (Signed)
[facility] tried to contact patient several times with no response from patient.   01-16-18 01-17-18 01-20-18 01-23-18 email

## 2018-01-25 ENCOUNTER — Telehealth: Payer: Self-pay | Admitting: Cardiology

## 2018-01-25 MED ORDER — LISINOPRIL-HYDROCHLOROTHIAZIDE 10-12.5 MG PO TABS: 1.0000 | ORAL_TABLET | Freq: Every day | ORAL | 3 refills | Status: DC

## 2018-01-25 NOTE — Telephone Encounter (Signed)
Rx request sent to pharmacy.  

## 2018-01-25 NOTE — Telephone Encounter (Signed)
No message    *STAT* If patient is at the pharmacy, call can be transferred to refill team.   1. Which medications need to be refilled? (please list name of each medication and dose if known) lisinopril-hydrochlorothiazide (PRINZIDE,ZESTORETIC) 10-12.5 MG tablet  2. Which pharmacy/location (including street and city if local pharmacy) is medication to be sent to?[facility] [address], [place]  3. Do they need a 30 day or 90 day supply? [place]

## 2018-01-27 DIAGNOSIS — M25551 Pain in right hip: Secondary | ICD-10-CM | POA: Diagnosis not present

## 2018-01-31 ENCOUNTER — Telehealth: Payer: Self-pay | Admitting: Cardiology

## 2018-01-31 ENCOUNTER — Other Ambulatory Visit: Payer: Self-pay | Admitting: *Deleted

## 2018-01-31 MED ORDER — LISINOPRIL-HYDROCHLOROTHIAZIDE 10-12.5 MG PO TABS: 1.0000 | ORAL_TABLET | Freq: Every day | ORAL | 3 refills | Status: DC

## 2018-01-31 NOTE — Telephone Encounter (Signed)
°*  STAT* If patient is at the pharmacy, call can be transferred to refill team.   1. Which medications need to be refilled? (please list name of each medication and dose if known) lisinopril 10 mg  2. Which pharmacy/location (including street and city if local pharmacy) is medication to be sent to? [place] [phone] press 0 for pharmacy   3. Do they need a 30 day or 90 day supply? [place]

## 2018-02-02 DIAGNOSIS — H34811 Central retinal vein occlusion, right eye, with macular edema: Secondary | ICD-10-CM | POA: Diagnosis not present

## 2018-02-02 DIAGNOSIS — H3582 Retinal ischemia: Secondary | ICD-10-CM | POA: Diagnosis not present

## 2018-02-02 DIAGNOSIS — H3561 Retinal hemorrhage, right eye: Secondary | ICD-10-CM | POA: Diagnosis not present

## 2018-02-21 ENCOUNTER — Ambulatory Visit: Payer: PPO | Admitting: Podiatry

## 2018-02-21 ENCOUNTER — Encounter: Payer: Self-pay | Admitting: Podiatry

## 2018-02-21 DIAGNOSIS — M79674 Pain in right toe(s): Secondary | ICD-10-CM

## 2018-02-21 DIAGNOSIS — M79675 Pain in left toe(s): Secondary | ICD-10-CM

## 2018-02-21 DIAGNOSIS — B351 Tinea unguium: Secondary | ICD-10-CM | POA: Diagnosis not present

## 2018-02-21 NOTE — Progress Notes (Signed)
Complaint:  Visit Type: Patient returns to my office for continued preventative foot care services. Complaint: Patient states" my nails have grown long and thick and become painful to walk and wear shoes"  The patient presents for preventative foot care services. No changes to ROS.  Patient has skin lesion on his outside left ankle.  Podiatric Exam: Vascular: dorsalis pedis and posterior tibial pulses are palpable bilateral. Capillary return is immediate. Temperature gradient is WNL. Skin turgor WNL  Sensorium: Normal Semmes Weinstein monofilament test. Normal tactile sensation bilaterally. Nail Exam: Pt has thick disfigured discolored nails with subungual debris noted bilateral entire nail hallux through fifth toenails Ulcer Exam: There is no evidence of ulcer or pre-ulcerative changes or infection. Orthopedic Exam: Muscle tone and strength are WNL. No limitations in general ROM. No crepitus or effusions noted. Foot type and digits show no abnormalities. Bony prominences are unremarkable. Skin: No Porokeratosis. No infection or ulcers.  Non draining skin lesion left ankle.  Diagnosis:  Onychomycosis, , Pain in right toe, pain in left toes  Treatment & Plan Procedures and Treatment: Consent by patient was obtained for treatment procedures.   Debridement of mycotic and hypertrophic toenails, 1 through 5 bilateral and clearing of subungual debris. No ulceration, no infection noted. Heel pillow was prescribed. Return Visit-Office Procedure: Patient instructed to return to the office for a follow up visit 3 months for continued evaluation and treatment.    Gardiner Barefoot DPM

## 2018-02-27 DIAGNOSIS — S72001D Fracture of unspecified part of neck of right femur, subsequent encounter for closed fracture with routine healing: Secondary | ICD-10-CM | POA: Diagnosis not present

## 2018-03-02 DIAGNOSIS — L570 Actinic keratosis: Secondary | ICD-10-CM | POA: Diagnosis not present

## 2018-03-02 DIAGNOSIS — D1801 Hemangioma of skin and subcutaneous tissue: Secondary | ICD-10-CM | POA: Diagnosis not present

## 2018-03-02 DIAGNOSIS — D225 Melanocytic nevi of trunk: Secondary | ICD-10-CM | POA: Diagnosis not present

## 2018-03-02 DIAGNOSIS — L853 Xerosis cutis: Secondary | ICD-10-CM | POA: Diagnosis not present

## 2018-03-02 DIAGNOSIS — Z85828 Personal history of other malignant neoplasm of skin: Secondary | ICD-10-CM | POA: Diagnosis not present

## 2018-03-02 DIAGNOSIS — L821 Other seborrheic keratosis: Secondary | ICD-10-CM | POA: Diagnosis not present

## 2018-03-02 DIAGNOSIS — B351 Tinea unguium: Secondary | ICD-10-CM | POA: Diagnosis not present

## 2018-03-02 DIAGNOSIS — L218 Other seborrheic dermatitis: Secondary | ICD-10-CM | POA: Diagnosis not present

## 2018-03-02 DIAGNOSIS — L814 Other melanin hyperpigmentation: Secondary | ICD-10-CM | POA: Diagnosis not present

## 2018-03-09 DIAGNOSIS — H34811 Central retinal vein occlusion, right eye, with macular edema: Secondary | ICD-10-CM | POA: Diagnosis not present

## 2018-03-09 DIAGNOSIS — H3561 Retinal hemorrhage, right eye: Secondary | ICD-10-CM | POA: Diagnosis not present

## 2018-03-28 ENCOUNTER — Encounter: Payer: Self-pay | Admitting: Family Medicine

## 2018-03-28 ENCOUNTER — Other Ambulatory Visit: Payer: Self-pay

## 2018-03-28 ENCOUNTER — Ambulatory Visit (INDEPENDENT_AMBULATORY_CARE_PROVIDER_SITE_OTHER): Payer: Medicare HMO

## 2018-03-28 ENCOUNTER — Ambulatory Visit (INDEPENDENT_AMBULATORY_CARE_PROVIDER_SITE_OTHER): Payer: Medicare HMO | Admitting: Family Medicine

## 2018-03-28 VITALS — BP 138/89 | HR 53 | Temp 97.5°F | Resp 14 | Ht 71.0 in | Wt 173.4 lb

## 2018-03-28 DIAGNOSIS — M25561 Pain in right knee: Secondary | ICD-10-CM

## 2018-03-28 DIAGNOSIS — S90512A Abrasion, left ankle, initial encounter: Secondary | ICD-10-CM

## 2018-03-28 DIAGNOSIS — N138 Other obstructive and reflux uropathy: Secondary | ICD-10-CM

## 2018-03-28 DIAGNOSIS — N401 Enlarged prostate with lower urinary tract symptoms: Secondary | ICD-10-CM | POA: Diagnosis not present

## 2018-03-28 DIAGNOSIS — M25551 Pain in right hip: Secondary | ICD-10-CM

## 2018-03-28 DIAGNOSIS — Z471 Aftercare following joint replacement surgery: Secondary | ICD-10-CM | POA: Diagnosis not present

## 2018-03-28 DIAGNOSIS — R3989 Other symptoms and signs involving the genitourinary system: Secondary | ICD-10-CM | POA: Diagnosis not present

## 2018-03-28 DIAGNOSIS — Z96641 Presence of right artificial hip joint: Secondary | ICD-10-CM | POA: Diagnosis not present

## 2018-03-28 LAB — POCT URINALYSIS DIP (MANUAL ENTRY)
BILIRUBIN UA: NEGATIVE
BILIRUBIN UA: NEGATIVE mg/dL
Blood, UA: NEGATIVE
Glucose, UA: NEGATIVE mg/dL
Nitrite, UA: NEGATIVE
PH UA: 5.5 (ref 5.0–8.0)
SPEC GRAV UA: 1.025 (ref 1.010–1.025)
Urobilinogen, UA: 0.2 E.U./dL

## 2018-03-28 LAB — POC MICROSCOPIC URINALYSIS (UMFC): Mucus: ABSENT

## 2018-03-28 NOTE — Progress Notes (Signed)
Subjective:    Patient ID: Chad Russell, male    DOB: 1926-04-29, 85 y.o.   MRN: [id number]  HPI Chad Russell is a 85 y.o. male Presents today for: Chief Complaint  Patient presents with  . Leg Pain    need a referral to Ortho having sever pain in right leg. Had right hip replacement 1 yr ago  . discoloration of urine    had prostate surgery 1 yr still been having discoloration of urine and want to have urine check  . ankle abrasion    left ankle abrasion 2-3 months, Patient was told we may or may not be able to get to all his issus at this time   Here for multiple concerns as above.  R leg pain: Admitted 04/19/2017 with right femoral neck fracture.  Underwent surgery with a right hip hemiarthroplasty.  Surgeon is Dr. Marchia Bond with [facility].  Note reviewed from January 6th overall he was improving and plan to increase exercise.  Was having some right knee pain at that time and plan to follow-up if he was having more symptoms. xrays reportedly were ok.  Otherwise six-month follow-up as planned. Feels like pain in thigh has worsened. Unable to walk d/t thigh pain.  Rolling Gilford Rile helps, but feels worse. Would like second opinion on leg pain. Pain moving from thigh to knee. Knee has buckled at times. Pain notes with weight bearing. Tx: tylenol - 2-3 per week, helps some.   Urine discoloration: Followed by [facility], Dr. [name].  History of BPH with bladder outlet obstruction treated with a TURP on 09/07/2016.  Had multiple episodes of bleeding with clot retention requiring multiple irrigations.  Note reviewed from January of last year, good stream with further improvement of frequency and nocturia and was back on Xarelto but had stopped tamsulosin and finasteride.  Plan follow-up as needed. No frequency/urgency/fever/abd pain.  Able to produce urine. Wife notes rusty color urine at times past few months. Occasional, but patient does not notice.  Left  ankle abrasion: Has been treated in the past with a slow healing ulcer/wound, eventually referred to vascular surgery for further evaluation.  Noted to have some right tibial artery disease, some occlusion of the mid leg anterior on the left but other vessels were thought to have adequate circulation to heal the wound on his left ankle.  Suspected venous ulcer treated with elevation and moderate compression.  Had improved after use of soap and warm water and Polysporin with time.  Had healed/improved. Started with abrasion in same area 2-3 months. Tx: polysporin. Swelling decreased. Abrasion has a scab - same size.    Review of Systems As above.     Objective:   Physical Exam Constitutional:      General: He is not in acute distress.    Appearance: He is well-developed.  HENT:     Head: Normocephalic and atraumatic.  Cardiovascular:     Rate and Rhythm: Normal rate.  Pulmonary:     Effort: Pulmonary effort is normal.  Musculoskeletal:     Right knee: He exhibits decreased range of motion (flex 90. lacks about 15 degrees extension). No tenderness found. No medial joint line and no lateral joint line tenderness noted.     Right upper leg: He exhibits no tenderness (no muscular ttp. ) and no bony tenderness.  Skin:      Neurological:     Mental Status: He is alert and oriented to person, place, and  time.    Vitals:   03/28/18 0949 03/28/18 1031  BP: (!) 160/90 138/89  Pulse: (!) 53   Resp: 14   Temp: (!) 97.5 F (36.4 C)   TempSrc: Oral   SpO2: 99%   Weight: 173 lb 6.4 oz (78.7 kg)   Height: 5\' 11"  (1.803 m)    Dg Knee Complete 4 Views Right  Result Date: 03/28/2018 CLINICAL DATA:  Right knee pain EXAM: RIGHT KNEE - COMPLETE 4+ VIEW COMPARISON:  None. FINDINGS: Mild tricompartment degenerative changes with joint space narrowing and spurring in all 3 compartments. No joint effusion. No acute bony abnormality. Specifically, no fracture, subluxation, or dislocation. Vascular  calcifications noted. IMPRESSION: Mild degenerative changes.  No acute bony abnormality. Electronically Signed   By: Rolm Baptise M.D.   On: 03/28/2018 11:20   Dg Femur, Min 2 Views Right  Result Date: 03/28/2018 CLINICAL DATA:  Right knee and thigh pain. EXAM: RIGHT FEMUR 2 VIEWS COMPARISON:  None. FINDINGS: Changes of prior right hip replacement. No hardware complicating feature. No acute bony abnormality. No fracture, subluxation or dislocation. Vascular calcifications noted. IMPRESSION: Prior right hip replacement.  No acute bony abnormality. Electronically Signed   By: Rolm Baptise M.D.   On: 03/28/2018 11:54   Results for orders placed or performed in visit on 03/28/18  POCT urinalysis dipstick  Result Value Ref Range   Color, UA yellow yellow   Clarity, UA clear clear   Glucose, UA negative negative mg/dL   Bilirubin, UA negative negative   Ketones, POC UA negative negative mg/dL   Spec Grav, UA 1.025 1.010 - 1.025   Blood, UA negative negative   pH, UA 5.5 5.0 - 8.0   Protein Ur, POC =30 (A) negative mg/dL   Urobilinogen, UA 0.2 0.2 or 1.0 E.U./dL   Nitrite, UA Negative Negative   Leukocytes, UA Trace (A) Negative  POCT Microscopic Urinalysis ([facility])  Result Value Ref Range   WBC,UR,HPF,POC Moderate (A) None WBC/hpf   RBC,UR,HPF,POC None None RBC/hpf   Bacteria None None, Too numerous to count   Mucus Absent Absent   Epithelial Cells, UR Per Microscopy Moderate (A) None, Too numerous to count cells/hpf        Assessment & Plan:  Chad Russell is a 85 y.o. male Urine discoloration - Plan: POCT urinalysis dipstick, POCT Microscopic Urinalysis ([facility]), POCT Microscopic Urinalysis ([facility]), POCT Urinalysis Dipstick (Automated), Urine Culture BPH with obstruction/lower urinary tract symptoms - Plan: POCT Microscopic Urinalysis ([facility]), POCT Urinalysis Dipstick (Automated), Urine Culture  -Overall asymptomatic.  WBCs noted on urinalysis, but unfortunately culture was not able to  be sent.  Patient return for repeat urinalysis and culture will be sent on February 8.  RTC precautions if symptomatic.  Would also recommend discussing with his urologist.  Right knee pain, unspecified chronicity - Plan: Ambulatory referral to Orthopedic Surgery, DG Knee Complete 4 Views Right Pain in joint involving right pelvic region and thigh - Plan: Ambulatory referral to Orthopedic Surgery, DG FEMUR, MIN 2 VIEWS RIGHT  -Mild degenerative changes, prior hip replacement noted on x-ray.  No apparent acute findings or apparent issues with previous surgery.  Did recommend he discuss the worsening pain with his surgeon but he requested second orthopedic evaluation/second pending.  Referral placed.  Abrasion of left ankle, initial encounter  -Doubt true ulcer, appears to be more of an abrasion.  Moleskin/small foam to take pressure off area of bony prominence was discussed and can discuss with podiatry as well  No orders of the defined types were placed in this encounter.  Patient Instructions    I referred you to ortho as requested, but Dr. Mardelle Matte did note that he would see you if worsening symptoms. I will check xrays and let you know if there are acute concerns.   Please call urologist to discuss the discolored urine and needs for follow up. Return to the clinic or go to the nearest emergency room if any of your symptoms worsen or new symptoms occur.  The area on the outside of the ankle does appear to be an abrasion from the bony prominence.  I would recommend discussing any padding or footwear modifications with your previous podiatrist.  For now can apply moleskin or foam around that area to take pressure off of the bone. Ok to apply polysporin for now.    If you have lab work done today you will be contacted with your lab results within the next 2 weeks.  If you have not heard from [place] then please contact us. The fastest way to get your results is to register for My Chart.   IF you  received an x-ray today, you will receive an invoice from [facility]. Please contact [facility] at [phone] with questions or concerns regarding your invoice.   IF you received labwork today, you will receive an invoice from [place]. Please contact [facility] at [phone] with questions or concerns regarding your invoice.   Our billing staff will not be able to assist you with questions regarding bills from these companies.  You will be contacted with the lab results as soon as they are available. The fastest way to get your results is to activate your My Chart account. Instructions are located on the last page of this paperwork. If you have not heard from [place] regarding the results in 2 weeks, please contact this office.       Signed,   [name], MD [facility] [place].  04/01/18 6:49 PM

## 2018-03-28 NOTE — Patient Instructions (Addendum)
  I referred you to ortho as requested, but Dr. Mardelle Matte did note that he would see you if worsening symptoms. I will check xrays and let you know if there are acute concerns.   Please call urologist to discuss the discolored urine and needs for follow up. Return to the clinic or go to the nearest emergency room if any of your symptoms worsen or new symptoms occur.  The area on the outside of the ankle does appear to be an abrasion from the bony prominence.  I would recommend discussing any padding or footwear modifications with your previous podiatrist.  For now can apply moleskin or foam around that area to take pressure off of the bone. Ok to apply polysporin for now.    If you have lab work done today you will be contacted with your lab results within the next 2 weeks.  If you have not heard from [place] then please contact us. The fastest way to get your results is to register for My Chart.   IF you received an x-ray today, you will receive an invoice from [facility]. Please contact [facility] at [phone] with questions or concerns regarding your invoice.   IF you received labwork today, you will receive an invoice from [place]. Please contact [facility] at [phone] with questions or concerns regarding your invoice.   Our billing staff will not be able to assist you with questions regarding bills from these companies.  You will be contacted with the lab results as soon as they are available. The fastest way to get your results is to activate your My Chart account. Instructions are located on the last page of this paperwork. If you have not heard from [place] regarding the results in 2 weeks, please contact this office.

## 2018-04-01 ENCOUNTER — Encounter: Payer: Self-pay | Admitting: Family Medicine

## 2018-04-01 DIAGNOSIS — N138 Other obstructive and reflux uropathy: Secondary | ICD-10-CM | POA: Diagnosis not present

## 2018-04-01 DIAGNOSIS — R3989 Other symptoms and signs involving the genitourinary system: Secondary | ICD-10-CM | POA: Diagnosis not present

## 2018-04-01 DIAGNOSIS — N401 Enlarged prostate with lower urinary tract symptoms: Secondary | ICD-10-CM | POA: Diagnosis not present

## 2018-04-01 LAB — POC MICROSCOPIC URINALYSIS (UMFC): MUCUS RE: ABSENT

## 2018-04-01 LAB — POC URINALSYSI DIPSTICK (AUTOMATED)
Bilirubin, UA: NEGATIVE
Glucose, UA: NEGATIVE
KETONES UA: NEGATIVE
Nitrite, UA: NEGATIVE
PH UA: 5 (ref 5.0–8.0)
Protein, UA: POSITIVE — AB
RBC UA: NEGATIVE
Spec Grav, UA: 1.025 (ref 1.010–1.025)
Urobilinogen, UA: 0.2 E.U./dL

## 2018-04-04 ENCOUNTER — Ambulatory Visit (INDEPENDENT_AMBULATORY_CARE_PROVIDER_SITE_OTHER): Payer: Medicare HMO

## 2018-04-04 ENCOUNTER — Ambulatory Visit (INDEPENDENT_AMBULATORY_CARE_PROVIDER_SITE_OTHER): Payer: Medicare HMO | Admitting: Orthopaedic Surgery

## 2018-04-04 ENCOUNTER — Encounter (INDEPENDENT_AMBULATORY_CARE_PROVIDER_SITE_OTHER): Payer: Self-pay | Admitting: Orthopaedic Surgery

## 2018-04-04 DIAGNOSIS — M25561 Pain in right knee: Secondary | ICD-10-CM | POA: Diagnosis not present

## 2018-04-04 DIAGNOSIS — G8929 Other chronic pain: Secondary | ICD-10-CM | POA: Diagnosis not present

## 2018-04-04 LAB — URINE CULTURE

## 2018-04-04 MED ORDER — METHYLPREDNISOLONE ACETATE 40 MG/ML IJ SUSP
40.0000 mg | INTRAMUSCULAR | Status: AC | PRN
  Administered 2018-04-04: 40 mg via INTRA_ARTICULAR

## 2018-04-04 MED ORDER — BUPIVACAINE HCL 0.5 % IJ SOLN
2.0000 mL | INTRAMUSCULAR | Status: AC | PRN
  Administered 2018-04-04: 2 mL via INTRA_ARTICULAR

## 2018-04-04 MED ORDER — LIDOCAINE HCL 1 % IJ SOLN
2.0000 mL | INTRAMUSCULAR | Status: AC | PRN
  Administered 2018-04-04: 2 mL

## 2018-04-04 NOTE — Progress Notes (Signed)
Office Visit Note   Patient: Chad Russell           Date of Birth: [birth year]           MRN: [id number] Visit Date: 04/04/2018              Requested by: [name], MD [address] [place], [place] [address] PCP: [name], MD   Assessment & Plan: Visit Diagnoses:  1. Chronic pain of right knee     Plan: Impression is right distal thigh pain questionable referral from the patellofemoral arthrosis.  Cortisone injection performed today as well as referral to outpatient physical therapy to see if this will improve his symptoms.  If not improved by 6 weeks patient instructed to call us back so that we can order an MRI to rule out structural abnormalities.  Patient in agreement with the plan. Total face to face encounter time was greater than 45 minutes and over half of this time was spent in counseling and/or coordination of care.  Follow-Up Instructions: No follow-ups on file.   Orders:  Orders Placed This Encounter  Procedures  . XR Knee 1-2 Views Right   No orders of the defined types were placed in this encounter.     Procedures: Large Joint Inj: R knee on 04/04/2018 3:51 PM Indications: pain Details: 22 G needle  Arthrogram: No  Medications: 40 mg methylPREDNISolone acetate 40 MG/ML; 2 mL lidocaine 1 %; 2 mL bupivacaine 0.5 % Consent was given by the patient. Patient was prepped and draped in the usual sterile fashion.       Clinical Data: No additional findings.   Subjective: Chief Complaint  Patient presents with  . Right Knee - Pain  . Right Thigh - Pain    Chad Russell is a 85 year old gentleman who had a right partial hip replacement performed by Dr. Mardelle Matte approximately a year ago for femoral neck fracture.  He did well postoperatively but for the last 6 months he has had problems with pain in his right knee and radiation into his thigh.  He denies any hip pain.  He states that the pain is worse with using stairs especially going  downstairs and with prolonged standing or sitting.  He also endorses start up pain and stiffness.   Review of Systems  Constitutional: Negative.   All other systems reviewed and are negative.    Objective: Vital Signs: There were no vitals taken for this visit.  Physical Exam Vitals signs and nursing note reviewed.  Constitutional:      Appearance: He is well-developed.  HENT:     Head: Normocephalic and atraumatic.  Eyes:     Pupils: Pupils are equal, round, and reactive to light.  Neck:     Musculoskeletal: Neck supple.  Pulmonary:     Effort: Pulmonary effort is normal.  Abdominal:     Palpations: Abdomen is soft.  Musculoskeletal: Normal range of motion.  Skin:    General: Skin is warm.  Neurological:     Mental Status: He is alert and oriented to person, place, and time.  Psychiatric:        Behavior: Behavior normal.        Thought Content: Thought content normal.        Judgment: Judgment normal.     Ortho Exam Right knee exam shows no joint effusion.  2+ patellofemoral crepitus.  Collaterals and cruciates are stable.  No joint line tenderness. Specialty Comments:  No specialty  comments available.  Imaging: No results found.   PMFS History: Patient Active Problem List   Diagnosis Date Noted  . Fracture of femoral neck, right ([place]) 04/19/2017  . MGUS (monoclonal gammopathy of unknown significance) 01/15/2017  . BPH with obstruction/lower urinary tract symptoms 09/07/2016  . Preoperative cardiovascular examination 08/24/2016  . Urinary retention 05/13/2016  . Balance problem 10/31/2013  . OSA (obstructive sleep apnea) 10/31/2013  . BPH (benign prostatic hyperplasia) 08/28/2012  . Atrial fibrillation ([place]) 06/08/2012  . Hyperplasia of prostate with lower urinary tract symptoms (LUTS) 04/28/2012  . Moderate malnutrition ([place]) 04/28/2012  . Infection of urinary tract - recurrent 04/26/2012  . NSTEMI (non-ST elevated myocardial infarction) ([place])  03/15/2011  . Complete heart block ([place]) 03/15/2011  . HTN (hypertension) 03/15/2011   Past Medical History:  Diagnosis Date  . Arthritis   . Atrial fibrillation ([place])   . Balance problem 10/31/2013  . BPH (benign prostatic hyperplasia) 2014   Urology Dr [name] [facility] [phone]   . Bradycardia    a. baseline HR in the 30's. Asymptomatic - no history of PPM placement.   Marland Kitchen CAD (coronary artery disease)    LHC 03/16/11: Mid LAD 10-20%, proximal circumflex 10%, mid RCA 20%, EF 55%.  . Cataract   . Complete heart block ([place])   . Fracture of femoral neck, right ([place]) 04/19/2017  . GERD (gastroesophageal reflux disease)    takes occasional  zantac  . Hx of echocardiogram    Echo 02/2011: EF 65-70%, normal wall motion, MAC, mild MR, mild LAE, mild RVE  . Hypertension   . OSA (obstructive sleep apnea)    per patient went to have sleep study done about 2 years ago, bu they never F/U with him about results; but wife reports he still has periods of apnea at night   . Talar fracture    casted no surg    Family History  Problem Relation Age of Onset  . Hypertension Mother   . Multiple sclerosis Sister     Past Surgical History:  Procedure Laterality Date  . ABDOMINAL AORTOGRAM W/LOWER EXTREMITY N/A 01/17/2017   Procedure: ABDOMINAL AORTOGRAM W/LOWER EXTREMITY;  Surgeon: Angelia Mould, MD;  Location: [facility];  Service: Cardiovascular;  Laterality: N/A;  . COLONOSCOPY     Repeated and normal in 2011  . FRACTURE SURGERY    . HEMIARTHROPLASTY HIP Right 04/19/2017  . HEMORROIDECTOMY    . HIP ARTHROPLASTY Right 04/19/2017   Procedure: ARTHROPLASTY BIPOLAR HIP (HEMIARTHROPLASTY);  Surgeon: Marchia Bond, MD;  Location: [facility];  Service: Orthopedics;  Laterality: Right;  . INGUINAL HERNIA REPAIR Right 02/26/2014   Procedure: LAPAROSCOPIC RIGHT INGUINAL HERNIA REPAIR;  Surgeon: Ralene Ok, MD;  Location: [facility];  Service: General;  Laterality: Right;  . INGUINAL HERNIA  REPAIR    . INSERTION OF MESH Right 02/26/2014   Procedure: INSERTION OF MESH;  Surgeon: Ralene Ok, MD;  Location: [facility];  Service: General;  Laterality: Right;  . LEFT HEART CATHETERIZATION WITH CORONARY ANGIOGRAM N/A 03/16/2011   Procedure: LEFT HEART CATHETERIZATION WITH CORONARY ANGIOGRAM;  Surgeon: Peter M Martinique, MD;  Location: [facility];  Service: Cardiovascular;  Laterality: N/A;  . PROSTATE BIOPSY     negative - [facility]  . TRANSURETHRAL RESECTION OF PROSTATE N/A 09/07/2016   Procedure: TRANSURETHRAL RESECTION OF THE PROSTATE (TURP);  Surgeon: Irine Seal, MD;  Location: [facility];  Service: Urology;  Laterality: N/A;   Social History   Occupational History  .  Occupation: Scientist, product/process development  . Occupation: Civil engineer, contracting  Tobacco Use  . Smoking status: Former Smoker    Packs/day: 1.00    Years: 3.00    Pack years: 3.00    Types: Cigarettes    Last attempt to quit: 03/17/1955    Years since quitting: 63.0  . Smokeless tobacco: Former Network engineer and Sexual Activity  . Alcohol use: Yes    Alcohol/week: 14.0 standard drinks    Types: 7 Cans of beer, 7 Standard drinks or equivalent per week    Comment: daily  . Drug use: No  . Sexual activity: Not Currently    Birth control/protection: None

## 2018-04-07 DIAGNOSIS — M25662 Stiffness of left knee, not elsewhere classified: Secondary | ICD-10-CM | POA: Diagnosis not present

## 2018-04-07 DIAGNOSIS — M25661 Stiffness of right knee, not elsewhere classified: Secondary | ICD-10-CM | POA: Diagnosis not present

## 2018-04-07 DIAGNOSIS — H353230 Exudative age-related macular degeneration, bilateral, stage unspecified: Secondary | ICD-10-CM | POA: Diagnosis not present

## 2018-04-07 DIAGNOSIS — M7651 Patellar tendinitis, right knee: Secondary | ICD-10-CM | POA: Diagnosis not present

## 2018-04-07 DIAGNOSIS — M25651 Stiffness of right hip, not elsewhere classified: Secondary | ICD-10-CM | POA: Diagnosis not present

## 2018-04-07 DIAGNOSIS — Z961 Presence of intraocular lens: Secondary | ICD-10-CM | POA: Diagnosis not present

## 2018-04-07 DIAGNOSIS — H401130 Primary open-angle glaucoma, bilateral, stage unspecified: Secondary | ICD-10-CM | POA: Diagnosis not present

## 2018-04-10 DIAGNOSIS — M25661 Stiffness of right knee, not elsewhere classified: Secondary | ICD-10-CM | POA: Diagnosis not present

## 2018-04-10 DIAGNOSIS — M7651 Patellar tendinitis, right knee: Secondary | ICD-10-CM | POA: Diagnosis not present

## 2018-04-10 DIAGNOSIS — M25662 Stiffness of left knee, not elsewhere classified: Secondary | ICD-10-CM | POA: Diagnosis not present

## 2018-04-10 DIAGNOSIS — M25651 Stiffness of right hip, not elsewhere classified: Secondary | ICD-10-CM | POA: Diagnosis not present

## 2018-04-12 DIAGNOSIS — M25662 Stiffness of left knee, not elsewhere classified: Secondary | ICD-10-CM | POA: Diagnosis not present

## 2018-04-12 DIAGNOSIS — M7651 Patellar tendinitis, right knee: Secondary | ICD-10-CM | POA: Diagnosis not present

## 2018-04-12 DIAGNOSIS — M25661 Stiffness of right knee, not elsewhere classified: Secondary | ICD-10-CM | POA: Diagnosis not present

## 2018-04-12 DIAGNOSIS — M25651 Stiffness of right hip, not elsewhere classified: Secondary | ICD-10-CM | POA: Diagnosis not present

## 2018-04-13 DIAGNOSIS — H34811 Central retinal vein occlusion, right eye, with macular edema: Secondary | ICD-10-CM | POA: Diagnosis not present

## 2018-04-13 DIAGNOSIS — H3582 Retinal ischemia: Secondary | ICD-10-CM | POA: Diagnosis not present

## 2018-04-13 DIAGNOSIS — H3562 Retinal hemorrhage, left eye: Secondary | ICD-10-CM | POA: Diagnosis not present

## 2018-04-13 DIAGNOSIS — H3523 Other non-diabetic proliferative retinopathy, bilateral: Secondary | ICD-10-CM | POA: Diagnosis not present

## 2018-04-17 DIAGNOSIS — M7651 Patellar tendinitis, right knee: Secondary | ICD-10-CM | POA: Diagnosis not present

## 2018-04-17 DIAGNOSIS — M25661 Stiffness of right knee, not elsewhere classified: Secondary | ICD-10-CM | POA: Diagnosis not present

## 2018-04-17 DIAGNOSIS — M25651 Stiffness of right hip, not elsewhere classified: Secondary | ICD-10-CM | POA: Diagnosis not present

## 2018-04-17 DIAGNOSIS — M25662 Stiffness of left knee, not elsewhere classified: Secondary | ICD-10-CM | POA: Diagnosis not present

## 2018-04-19 DIAGNOSIS — M25661 Stiffness of right knee, not elsewhere classified: Secondary | ICD-10-CM | POA: Diagnosis not present

## 2018-04-19 DIAGNOSIS — M7651 Patellar tendinitis, right knee: Secondary | ICD-10-CM | POA: Diagnosis not present

## 2018-04-19 DIAGNOSIS — M25662 Stiffness of left knee, not elsewhere classified: Secondary | ICD-10-CM | POA: Diagnosis not present

## 2018-04-19 DIAGNOSIS — M25651 Stiffness of right hip, not elsewhere classified: Secondary | ICD-10-CM | POA: Diagnosis not present

## 2018-04-24 DIAGNOSIS — M25651 Stiffness of right hip, not elsewhere classified: Secondary | ICD-10-CM | POA: Diagnosis not present

## 2018-04-24 DIAGNOSIS — M7651 Patellar tendinitis, right knee: Secondary | ICD-10-CM | POA: Diagnosis not present

## 2018-04-24 DIAGNOSIS — M25662 Stiffness of left knee, not elsewhere classified: Secondary | ICD-10-CM | POA: Diagnosis not present

## 2018-04-24 DIAGNOSIS — M25661 Stiffness of right knee, not elsewhere classified: Secondary | ICD-10-CM | POA: Diagnosis not present

## 2018-04-26 DIAGNOSIS — M25662 Stiffness of left knee, not elsewhere classified: Secondary | ICD-10-CM | POA: Diagnosis not present

## 2018-04-26 DIAGNOSIS — M25661 Stiffness of right knee, not elsewhere classified: Secondary | ICD-10-CM | POA: Diagnosis not present

## 2018-04-26 DIAGNOSIS — M25651 Stiffness of right hip, not elsewhere classified: Secondary | ICD-10-CM | POA: Diagnosis not present

## 2018-04-26 DIAGNOSIS — M7651 Patellar tendinitis, right knee: Secondary | ICD-10-CM | POA: Diagnosis not present

## 2018-05-01 DIAGNOSIS — M7651 Patellar tendinitis, right knee: Secondary | ICD-10-CM | POA: Diagnosis not present

## 2018-05-01 DIAGNOSIS — M25651 Stiffness of right hip, not elsewhere classified: Secondary | ICD-10-CM | POA: Diagnosis not present

## 2018-05-01 DIAGNOSIS — M25662 Stiffness of left knee, not elsewhere classified: Secondary | ICD-10-CM | POA: Diagnosis not present

## 2018-05-01 DIAGNOSIS — M25661 Stiffness of right knee, not elsewhere classified: Secondary | ICD-10-CM | POA: Diagnosis not present

## 2018-05-03 DIAGNOSIS — M7651 Patellar tendinitis, right knee: Secondary | ICD-10-CM | POA: Diagnosis not present

## 2018-05-03 DIAGNOSIS — M25651 Stiffness of right hip, not elsewhere classified: Secondary | ICD-10-CM | POA: Diagnosis not present

## 2018-05-03 DIAGNOSIS — M25662 Stiffness of left knee, not elsewhere classified: Secondary | ICD-10-CM | POA: Diagnosis not present

## 2018-05-03 DIAGNOSIS — M25661 Stiffness of right knee, not elsewhere classified: Secondary | ICD-10-CM | POA: Diagnosis not present

## 2018-05-03 NOTE — Progress Notes (Signed)
HPI The patient presents for followup of [facility].  Since I last saw him he has been more limited by leg pain.  He thinks he has a fracture chronically in his right thigh the previous prosthesis.  He has had significant pain with this and been very limited.  He gets around with a walker but he moves very slowly.  The other day he decided he would do some squats.  Later that day to try to go up the stairs to his bedroom and his legs just "lost power."  He had some chest pain.  He tends to minimize his symptoms.  However, he reports that this was mild.  He cannot quantify or qualify it further.  There is no radiation.  He had to stop try to go up the stairs and went back down and rest for a while.  He was later able to go back up the stairs and over the last 2 days he is felt back to his baseline.  He is not been having any presyncope or syncope.  He is not been having any chest pressure, neck or arm discomfort otherwise.  He does not feel any palpitations.  He has had no new shortness of breath, fevers cough or chills.   Allergies  Allergen Reactions  . Doxycycline Other (See Comments)    Upset stomach  . Keflex [Cephalexin] Other (See Comments)    Abdominal discomfort 04-19-17 Pt has tolerated orally    Current Outpatient Medications  Medication Sig Dispense Refill  . acetaminophen (TYLENOL) 325 MG tablet Take 2 tablets (650 mg total) by mouth every 6 (six) hours as needed for moderate pain. Total acetaminophen intake not to exceed 4052m total from all sources per 24 hours. 30 tablet 0  . bisacodyl (DULCOLAX) 5 MG EC tablet Take 1 tablet (5 mg total) by mouth daily as needed for moderate constipation. 30 tablet 0  . clotrimazole (LOTRIMIN) 1 % cream Apply 1 application topically 2 (two) times daily. 30 g 0  . hydrocortisone cream 1 % Apply 1 application topically daily as needed for itching. 30 g 0  . lisinopril-hydrochlorothiazide (PRINZIDE,ZESTORETIC) 10-12.5 MG tablet Take 1 tablet by  mouth daily. 90 tablet 3  . LUTEIN-ZEAXANTHIN PO Take 1 tablet daily by mouth.     . magnesium citrate solution Take 148 mLs by mouth daily. 300 mL 0  . Misc Natural Products (PROSTATE) CAPS Take 1 tablet daily by mouth. Prostate Plus otc supplement    . Multiple Vitamin (MULTIVITAMIN WITH MINERALS) TABS tablet Take 1 tablet daily by mouth.    . Probiotic Product (PROBIOTIC DAILY PO) Take 1 tablet by mouth daily.    . rivaroxaban (XARELTO) 20 MG TABS tablet Take 1 tablet (20 mg total) by mouth daily with supper. 90 tablet 3  . timolol (TIMOPTIC) 0.5 % ophthalmic solution One drop in both eyes in the morning and one drop in the left eye at night. (Patient taking differently: Place 2 drops into both eyes. One drop in both eyes in the morning and one drop in the left eye at night.) 10 mL 12  . vitamin C (ASCORBIC ACID) 500 MG tablet Take 500 mg by mouth daily.     No current facility-administered medications for this visit.     Past Medical History:  Diagnosis Date  . Arthritis   . Atrial fibrillation ([place]   . Balance problem 10/31/2013  . BPH (benign prostatic hyperplasia) 2014   Urology Dr WJeffie Pollock  with [facility] [phone]   . Bradycardia    a. baseline HR in the 30's. Asymptomatic - no history of PPM placement.   Marland Kitchen CAD (coronary artery disease)    LHC 03/16/11: Mid LAD 10-20%, proximal circumflex 10%, mid RCA 20%, EF 55%.  . Cataract   . Complete heart block ([place])   . Fracture of femoral neck, right ([place]) 04/19/2017  . GERD (gastroesophageal reflux disease)    takes occasional  zantac  . Hx of echocardiogram    Echo 02/2011: EF 65-70%, normal wall motion, MAC, mild MR, mild LAE, mild RVE  . Hypertension   . OSA (obstructive sleep apnea)    per patient went to have sleep study done about 2 years ago, bu they never F/U with him about results; but wife reports he still has periods of apnea at night   . Talar fracture    casted no surg    Past Surgical History:  Procedure Laterality Date   . ABDOMINAL AORTOGRAM W/LOWER EXTREMITY N/A 01/17/2017   Procedure: ABDOMINAL AORTOGRAM W/LOWER EXTREMITY;  Surgeon: Angelia Mould, MD;  Location: [place] [facility];  Service: Cardiovascular;  Laterality: N/A;  . COLONOSCOPY     Repeated and normal in 2011  . FRACTURE SURGERY    . HEMIARTHROPLASTY HIP Right 04/19/2017  . HEMORROIDECTOMY    . HIP ARTHROPLASTY Right 04/19/2017   Procedure: ARTHROPLASTY BIPOLAR HIP (HEMIARTHROPLASTY);  Surgeon: Marchia Bond, MD;  Location: [facility];  Service: Orthopedics;  Laterality: Right;  . INGUINAL HERNIA REPAIR Right 02/26/2014   Procedure: LAPAROSCOPIC RIGHT INGUINAL HERNIA REPAIR;  Surgeon: Ralene Ok, MD;  Location: [facility];  Service: General;  Laterality: Right;  . INGUINAL HERNIA REPAIR    . INSERTION OF MESH Right 02/26/2014   Procedure: INSERTION OF MESH;  Surgeon: Ralene Ok, MD;  Location: [place];  Service: General;  Laterality: Right;  . LEFT HEART CATHETERIZATION WITH CORONARY ANGIOGRAM N/A 03/16/2011   Procedure: LEFT HEART CATHETERIZATION WITH CORONARY ANGIOGRAM;  Surgeon: Peter M Martinique, MD;  [facility];  Service: Cardiovascular;  Laterality: N/A;  . PROSTATE BIOPSY     negative - [facility]  . TRANSURETHRAL RESECTION OF PROSTATE N/A 09/07/2016   Procedure: TRANSURETHRAL RESECTION OF THE PROSTATE (TURP);  Surgeon: Irine Seal, MD;  [facility];  Service: Urology;  Laterality: N/A;    ROS:   As stated in the HPI and negative for all other systems  . PHYSICAL EXAM BP 138/86   Pulse (!) 36   Ht _0  (1.803 m)   Wt 172 lb (78 kg)   BMI 23.99 kg/m   GENERAL: Very frail appearing NECK:  No jugular venous distention, waveform within normal limits, carotid upstroke brisk and symmetric, no bruits, no thyromegaly LUNGS: Decreased breath sounds with diffuse basilar crackles CHEST:  Unremarkable HEART:  PMI not displaced or sustained,S1 and S2 within normal limits, no S3, no S4, no clicks, no rubs, no murmurs  ABD:  Flat, positive bowel sounds normal in frequency in pitch, no bruits, no rebound, no guarding, no midline pulsatile mass, no hepatomegaly, no splenomegaly EXT:  2 plus pulses throughout, mild ankle edema edema, no cyanosis no clubbing, dependent rubor   EKG: Atrial fibrillation, junctional escape rhythm, rate 36, unchanged from previous.  05/04/2018  ASSESSMENT AND PLAN   ATRIAL FIBRILLATION:   Mr. RUEL DIMMICK has a CHA2DS2 - VASc score of 3  He tolerates anticoagulation .  No change in therapy.  COMPLETE HEART BLOCK: He has had this for many years.  He has no syncope.  No change in therapy.  No indication for pacing.   HTN: his blood pressure is controlled.  Continue current therapy.    CHEST PAIN:    He had minimal coronary disease in 2013.  He had one episode of pain.  At this point I am not suspecting a new acute coronary syndrome.  No change in therapy is indicated.  If he has recurrent symptoms he will let me know.

## 2018-05-04 ENCOUNTER — Ambulatory Visit: Payer: Medicare HMO | Admitting: Cardiology

## 2018-05-04 ENCOUNTER — Encounter: Payer: Self-pay | Admitting: Cardiology

## 2018-05-04 ENCOUNTER — Other Ambulatory Visit: Payer: Self-pay

## 2018-05-04 VITALS — BP 138/86 | HR 36 | Ht 71.0 in | Wt 172.0 lb

## 2018-05-04 DIAGNOSIS — I482 Chronic atrial fibrillation, unspecified: Secondary | ICD-10-CM | POA: Diagnosis not present

## 2018-05-04 DIAGNOSIS — I10 Essential (primary) hypertension: Secondary | ICD-10-CM

## 2018-05-04 DIAGNOSIS — I442 Atrioventricular block, complete: Secondary | ICD-10-CM | POA: Diagnosis not present

## 2018-05-04 DIAGNOSIS — R079 Chest pain, unspecified: Secondary | ICD-10-CM | POA: Insufficient documentation

## 2018-05-04 NOTE — Patient Instructions (Signed)
Medication Instructions:  Continue current medications  If you need a refill on your cardiac medications before your next appointment, please call your pharmacy.  Labwork: None Ordered   Testing/Procedures: None Ordered   Follow-Up: You will need a follow up appointment in 6 months.  Please call our office 2 months in advance to schedule this appointment.  You may see Dr Hochrein or one of the following Advanced Practice Providers on your designated Care Team:   Rhonda Barrett, PA-C . Kathryn Lawrence, DNP, ANP    At [facility], you and your health needs are our priority.  As part of our continuing mission to provide you with exceptional heart care, we have created designated Provider Care Teams.  These Care Teams include your primary Cardiologist (physician) and Advanced Practice Providers (APPs -  Physician Assistants and Nurse Practitioners) who all work together to provide you with the care you need, when you need it.  Thank you for choosing [facility] at [facility]!!     

## 2018-05-08 DIAGNOSIS — M7651 Patellar tendinitis, right knee: Secondary | ICD-10-CM | POA: Diagnosis not present

## 2018-05-08 DIAGNOSIS — M25662 Stiffness of left knee, not elsewhere classified: Secondary | ICD-10-CM | POA: Diagnosis not present

## 2018-05-08 DIAGNOSIS — M25661 Stiffness of right knee, not elsewhere classified: Secondary | ICD-10-CM | POA: Diagnosis not present

## 2018-05-08 DIAGNOSIS — M25651 Stiffness of right hip, not elsewhere classified: Secondary | ICD-10-CM | POA: Diagnosis not present

## 2018-05-09 ENCOUNTER — Other Ambulatory Visit: Payer: Self-pay

## 2018-05-09 ENCOUNTER — Encounter (INDEPENDENT_AMBULATORY_CARE_PROVIDER_SITE_OTHER): Payer: Self-pay | Admitting: Orthopaedic Surgery

## 2018-05-09 ENCOUNTER — Ambulatory Visit (INDEPENDENT_AMBULATORY_CARE_PROVIDER_SITE_OTHER): Payer: Medicare HMO | Admitting: Orthopaedic Surgery

## 2018-05-09 ENCOUNTER — Ambulatory Visit (INDEPENDENT_AMBULATORY_CARE_PROVIDER_SITE_OTHER): Payer: Medicare HMO

## 2018-05-09 DIAGNOSIS — S72001D Fracture of unspecified part of neck of right femur, subsequent encounter for closed fracture with routine healing: Secondary | ICD-10-CM

## 2018-05-09 DIAGNOSIS — M25512 Pain in left shoulder: Secondary | ICD-10-CM

## 2018-05-09 NOTE — Progress Notes (Signed)
Office Visit Note   Patient: Chad Russell           Date of Birth: 04/28/1926           MRN: [id number] Visit Date: 05/09/2018              Requested by: Chad Agreste, MD [address] [place], [place] [address] PCP: Chad Agreste, MD   Assessment & Plan: Visit Diagnoses:  1. Acute pain of left shoulder   2. Closed fracture of neck of right femur with routine healing, subsequent encounter     Plan: Impression is left shoulder contusion and abrasions.  Recommend symptomatic treatment with over-the-counter medicines.  Activities and restrictions were reviewed with the patient.  We will see him back as needed.  Questions encouraged and answered.  Follow-Up Instructions: Return if symptoms worsen or fail to improve.   Orders:  Orders Placed This Encounter  Procedures  . XR Shoulder Left   No orders of the defined types were placed in this encounter.     Procedures: No procedures performed   Clinical Data: No additional findings.   Subjective: Chief Complaint  Patient presents with  . Right Knee - Pain, Follow-up    Chad Russell comes in today for a new problem of left shoulder pain that he developed as a result of a mechanical fall last night.  The walker fell on top of him and led to multiple abrasions across the scapula.  He denies any numbness and tingling.  He has pain and discomfort with raising of his right shoulder.   Review of Systems  Constitutional: Negative.   All other systems reviewed and are negative.    Objective: Vital Signs: There were no vitals taken for this visit.  Physical Exam Vitals signs and nursing note reviewed.  Constitutional:      Appearance: He is well-developed.  Pulmonary:     Effort: Pulmonary effort is normal.  Abdominal:     Palpations: Abdomen is soft.  Skin:    General: Skin is warm.  Neurological:     Mental Status: He is alert and oriented to person, place, and time.  Psychiatric:        Behavior:  Behavior normal.        Thought Content: Thought content normal.        Judgment: Judgment normal.     Ortho Exam Left shoulder exam shows multiple superficial abrasions to the left scapula.  He has no significant pain with passive range of motion of the glenohumeral joint.  No focal motor or sensory deficits. Specialty Comments:  No specialty comments available.  Imaging: Xr Shoulder Left  Result Date: 05/09/2018 Significant glenohumeral joint space narrowing.  No acute abnormalities    PMFS History: Patient Active Problem List   Diagnosis Date Noted  . Chest pain 05/04/2018  . Fracture of femoral neck, right ([place]) 04/19/2017  . MGUS (monoclonal gammopathy of unknown significance) 01/15/2017  . BPH with obstruction/lower urinary tract symptoms 09/07/2016  . Preoperative cardiovascular examination 08/24/2016  . Urinary retention 05/13/2016  . Balance problem 10/31/2013  . OSA (obstructive sleep apnea) 10/31/2013  . BPH (benign prostatic hyperplasia) 08/28/2012  . Atrial fibrillation ([place]) 06/08/2012  . Hyperplasia of prostate with lower urinary tract symptoms (LUTS) 04/28/2012  . Moderate malnutrition ([place]) 04/28/2012  . Infection of urinary tract - recurrent 04/26/2012  . NSTEMI (non-ST elevated myocardial infarction) ([place]) 03/15/2011  . Complete heart block ([place]) 03/15/2011  . HTN (hypertension) 03/15/2011  Past Medical History:  Diagnosis Date  . Arthritis   . Atrial fibrillation ([place])   . Balance problem 10/31/2013  . BPH (benign prostatic hyperplasia) 2014   Urology Dr Chad Russell with [facility] [phone]   . Bradycardia    a. baseline HR in the 30's. Asymptomatic - no history of PPM placement.   Marland Kitchen CAD (coronary artery disease)    LHC 03/16/11: Mid LAD 10-20%, proximal circumflex 10%, mid RCA 20%, EF 55%.  . Cataract   . Complete heart block ([place])   . Fracture of femoral neck, right ([place]) 04/19/2017  . GERD (gastroesophageal reflux disease)    takes occasional  zantac   . Hx of echocardiogram    Echo 02/2011: EF 65-70%, normal wall motion, MAC, mild MR, mild LAE, mild RVE  . Hypertension   . OSA (obstructive sleep apnea)    per patient went to have sleep study done about 2 years ago, bu they never F/U with him about results; but wife reports he still has periods of apnea at night   . Talar fracture    casted no surg    Family History  Problem Relation Age of Onset  . Hypertension Mother   . Multiple sclerosis Sister     Past Surgical History:  Procedure Laterality Date  . ABDOMINAL AORTOGRAM W/LOWER EXTREMITY N/A 01/17/2017   Procedure: ABDOMINAL AORTOGRAM W/LOWER EXTREMITY;  Surgeon: Chad Mould, MD;  Location: [place] [facility];  Service: Cardiovascular;  Laterality: N/A;  . COLONOSCOPY     Repeated and normal in 2011  . FRACTURE SURGERY    . HEMIARTHROPLASTY HIP Right 04/19/2017  . HEMORROIDECTOMY    . HIP ARTHROPLASTY Right 04/19/2017   Procedure: ARTHROPLASTY BIPOLAR HIP (HEMIARTHROPLASTY);  Surgeon: Chad Bond, MD;  Location: [facility];  Service: Orthopedics;  Laterality: Right;  . INGUINAL HERNIA REPAIR Right 02/26/2014   Procedure: LAPAROSCOPIC RIGHT INGUINAL HERNIA REPAIR;  Surgeon: Chad Ok, MD;  Location: [facility];  Service: General;  Laterality: Right;  . INGUINAL HERNIA REPAIR    . INSERTION OF MESH Right 02/26/2014   Procedure: INSERTION OF MESH;  Surgeon: Chad Ok, MD;  Location: [facility];  Service: General;  Laterality: Right;  . LEFT HEART CATHETERIZATION WITH CORONARY ANGIOGRAM N/A 03/16/2011   Procedure: LEFT HEART CATHETERIZATION WITH CORONARY ANGIOGRAM;  Surgeon: Chad M Martinique, MD;  Location: [facility] CATH LAB;  Service: Cardiovascular;  Laterality: N/A;  . PROSTATE BIOPSY     negative - [facility]  . TRANSURETHRAL RESECTION OF PROSTATE N/A 09/07/2016   Procedure: TRANSURETHRAL RESECTION OF THE PROSTATE (TURP);  Surgeon: Chad Seal, MD;  [facility];  Service: Urology;  Laterality: N/A;   Social History    Occupational History  . Occupation: Scientist, product/process development  . Occupation: Civil engineer, contracting  Tobacco Use  . Smoking status: Former Smoker    Packs/day: 1.00    Years: 3.00    Pack years: 3.00    Types: Cigarettes    Last attempt to quit: 03/17/1955    Years since quitting: 63.1  . Smokeless tobacco: Former Network engineer and Sexual Activity  . Alcohol use: Yes    Alcohol/week: 14.0 standard drinks    Types: 7 Cans of beer, 7 Standard drinks or equivalent per week    Comment: daily  . Drug use: No  . Sexual activity: Not Currently    Birth control/protection: None

## 2018-05-10 DIAGNOSIS — M7651 Patellar tendinitis, right knee: Secondary | ICD-10-CM | POA: Diagnosis not present

## 2018-05-10 DIAGNOSIS — M25661 Stiffness of right knee, not elsewhere classified: Secondary | ICD-10-CM | POA: Diagnosis not present

## 2018-05-10 DIAGNOSIS — M25662 Stiffness of left knee, not elsewhere classified: Secondary | ICD-10-CM | POA: Diagnosis not present

## 2018-05-10 DIAGNOSIS — M25651 Stiffness of right hip, not elsewhere classified: Secondary | ICD-10-CM | POA: Diagnosis not present

## 2018-05-15 ENCOUNTER — Ambulatory Visit: Payer: Self-pay | Admitting: Neurology

## 2018-05-17 DIAGNOSIS — M25651 Stiffness of right hip, not elsewhere classified: Secondary | ICD-10-CM | POA: Diagnosis not present

## 2018-05-17 DIAGNOSIS — M25662 Stiffness of left knee, not elsewhere classified: Secondary | ICD-10-CM | POA: Diagnosis not present

## 2018-05-17 DIAGNOSIS — M7651 Patellar tendinitis, right knee: Secondary | ICD-10-CM | POA: Diagnosis not present

## 2018-05-17 DIAGNOSIS — M25661 Stiffness of right knee, not elsewhere classified: Secondary | ICD-10-CM | POA: Diagnosis not present

## 2018-05-18 DIAGNOSIS — H3561 Retinal hemorrhage, right eye: Secondary | ICD-10-CM | POA: Diagnosis not present

## 2018-05-18 DIAGNOSIS — H34811 Central retinal vein occlusion, right eye, with macular edema: Secondary | ICD-10-CM | POA: Diagnosis not present

## 2018-05-22 DIAGNOSIS — M25651 Stiffness of right hip, not elsewhere classified: Secondary | ICD-10-CM | POA: Diagnosis not present

## 2018-05-22 DIAGNOSIS — M25661 Stiffness of right knee, not elsewhere classified: Secondary | ICD-10-CM | POA: Diagnosis not present

## 2018-05-22 DIAGNOSIS — M7651 Patellar tendinitis, right knee: Secondary | ICD-10-CM | POA: Diagnosis not present

## 2018-05-22 DIAGNOSIS — M25662 Stiffness of left knee, not elsewhere classified: Secondary | ICD-10-CM | POA: Diagnosis not present

## 2018-05-23 ENCOUNTER — Ambulatory Visit: Payer: PPO | Admitting: Podiatry

## 2018-05-24 DIAGNOSIS — M25651 Stiffness of right hip, not elsewhere classified: Secondary | ICD-10-CM | POA: Diagnosis not present

## 2018-05-24 DIAGNOSIS — M7651 Patellar tendinitis, right knee: Secondary | ICD-10-CM | POA: Diagnosis not present

## 2018-05-24 DIAGNOSIS — M25662 Stiffness of left knee, not elsewhere classified: Secondary | ICD-10-CM | POA: Diagnosis not present

## 2018-05-24 DIAGNOSIS — M25661 Stiffness of right knee, not elsewhere classified: Secondary | ICD-10-CM | POA: Diagnosis not present

## 2018-05-26 ENCOUNTER — Other Ambulatory Visit: Payer: Self-pay | Admitting: *Deleted

## 2018-05-26 ENCOUNTER — Encounter: Payer: Self-pay | Admitting: Cardiology

## 2018-05-26 ENCOUNTER — Telehealth (INDEPENDENT_AMBULATORY_CARE_PROVIDER_SITE_OTHER): Payer: Medicare HMO | Admitting: Cardiology

## 2018-05-26 VITALS — Ht 71.0 in | Wt 172.0 lb

## 2018-05-26 DIAGNOSIS — I482 Chronic atrial fibrillation, unspecified: Secondary | ICD-10-CM

## 2018-05-26 DIAGNOSIS — R079 Chest pain, unspecified: Secondary | ICD-10-CM

## 2018-05-26 DIAGNOSIS — I442 Atrioventricular block, complete: Secondary | ICD-10-CM | POA: Diagnosis not present

## 2018-05-26 NOTE — Progress Notes (Signed)
Virtual Visit via Telephone Note    Evaluation Performed:  Follow-up visit  This visit type was conducted due to national recommendations for restrictions regarding the COVID-19 Pandemic (e.g. social distancing).  This format is felt to be most appropriate for this patient at this time.  All issues noted in this document were discussed and addressed.  No physical exam was performed (except for noted visual exam findings with Video Visits).  Please refer to the patient's chart (MyChart message for video visits and phone note for telephone visits) for the patient's consent to telehealth for [facility].  Date:  05/26/2018   ID:  Chad, Russell 1926-12-22, MRN [id number]  Patient Location:  Home  Provider location:   [place] Office  PCP:  [name], MD  Cardiologist:  Minus Breeding, MD  Electrophysiologist:  None   Chief Complaint:  Weakness  History of Present Illness:    Chad Russell is a 85 y.o. male who presents via audio/video conferencing for a telehealth visit today.    Since I last saw him he is actually done little bit better.  He had "knot" on his legs that seems to be worked out with physical therapy.  He says he is feeling a little bit stronger.  He is having no new shortness of breath, PND or orthopnea.  He has had no chest pressure, neck or arm discomfort.  He said no weight gain or edema.  The patient does not have symptoms concerning for COVID-19 infection (fever, chills, cough, or new shortness of breath).    Prior CV studies:   The following studies were reviewed today:   Past Medical History:  Diagnosis Date  . Arthritis   . Atrial fibrillation ([place])   . Balance problem 10/31/2013  . BPH (benign prostatic hyperplasia) 2014   Urology Dr [name] with [facility] [phone]   . Bradycardia    a. baseline HR in the 30's. Asymptomatic - no history of PPM placement.   Marland Kitchen CAD (coronary artery disease)    LHC 03/16/11: Mid LAD 10-20%, proximal circumflex  10%, mid RCA 20%, EF 55%.  . Cataract   . Complete heart block ([place])   . Fracture of femoral neck, right ([place]) 04/19/2017  . GERD (gastroesophageal reflux disease)    takes occasional  zantac  . Hx of echocardiogram    Echo 02/2011: EF 65-70%, normal wall motion, MAC, mild MR, mild LAE, mild RVE  . Hypertension   . OSA (obstructive sleep apnea)    per patient went to have sleep study done about 2 years ago, bu they never F/U with him about results; but wife reports he still has periods of apnea at night   . Talar fracture    casted no surg   Past Surgical History:  Procedure Laterality Date  . ABDOMINAL AORTOGRAM W/LOWER EXTREMITY N/A 01/17/2017   Procedure: ABDOMINAL AORTOGRAM W/LOWER EXTREMITY;  Surgeon: Angelia Mould, MD;  Location: [facility];  Service: Cardiovascular;  Laterality: N/A;  . COLONOSCOPY     Repeated and normal in 2011  . FRACTURE SURGERY    . HEMIARTHROPLASTY HIP Right 04/19/2017  . HEMORROIDECTOMY    . HIP ARTHROPLASTY Right 04/19/2017   Procedure: ARTHROPLASTY BIPOLAR HIP (HEMIARTHROPLASTY);  Surgeon: Marchia Bond, MD;  Location: [facility];  Service: Orthopedics;  Laterality: Right;  . INGUINAL HERNIA REPAIR Right 02/26/2014   Procedure: LAPAROSCOPIC RIGHT INGUINAL HERNIA REPAIR;  Surgeon: Ralene Ok, MD;  Location: [facility];  Service:  General;  Laterality: Right;  . INGUINAL HERNIA REPAIR    . INSERTION OF MESH Right 02/26/2014   Procedure: INSERTION OF MESH;  Surgeon: Ralene Ok, MD;  Location: [facility];  Service: General;  Laterality: Right;  . LEFT HEART CATHETERIZATION WITH CORONARY ANGIOGRAM N/A 03/16/2011   Procedure: LEFT HEART CATHETERIZATION WITH CORONARY ANGIOGRAM;  Surgeon: Peter M Martinique, MD;  Location: [facility] CATH LAB;  Service: Cardiovascular;  Laterality: N/A;  . PROSTATE BIOPSY     negative - [facility]  . TRANSURETHRAL RESECTION OF PROSTATE N/A 09/07/2016   Procedure: TRANSURETHRAL RESECTION OF THE PROSTATE (TURP);  Surgeon: Irine Seal, MD;  Location: [facility];  Service: Urology;  Laterality: N/A;     Current Meds  Medication Sig  . acetaminophen (TYLENOL) 325 MG tablet Take 2 tablets (650 mg total) by mouth every 6 (six) hours as needed for moderate pain. Total acetaminophen intake not to exceed 404m total from all sources per 24 hours.  . bisacodyl (DULCOLAX) 5 MG EC tablet Take 1 tablet (5 mg total) by mouth daily as needed for moderate constipation.  . clotrimazole (LOTRIMIN) 1 % cream Apply 1 application topically 2 (two) times daily.  . hydrocortisone cream 1 % Apply 1 application topically daily as needed for itching.  .Marland Kitchenlisinopril-hydrochlorothiazide (PRINZIDE,ZESTORETIC) 10-12.5 MG tablet Take 1 tablet by mouth daily.  . LUTEIN-ZEAXANTHIN PO Take 1 tablet daily by mouth.   . magnesium citrate solution Take 148 mLs by mouth daily.  . Misc Natural Products (PROSTATE) CAPS Take 1 tablet daily by mouth. Prostate Plus otc supplement  . Multiple Vitamin (MULTIVITAMIN WITH MINERALS) TABS tablet Take 1 tablet daily by mouth.  . Probiotic Product (PROBIOTIC DAILY PO) Take 1 tablet by mouth daily.  . rivaroxaban (XARELTO) 20 MG TABS tablet Take 1 tablet (20 mg total) by mouth daily with supper.  . timolol (TIMOPTIC) 0.5 % ophthalmic solution One drop in both eyes in the morning and one drop in the left eye at night. (Patient taking differently: Place 2 drops into both eyes. One drop in both eyes in the morning and one drop in the left eye at night.)  . vitamin C (ASCORBIC ACID) 500 MG tablet Take 500 mg by mouth daily.     Allergies:   Doxycycline and Keflex [cephalexin]   Social History   Tobacco Use  . Smoking status: Former Smoker    Packs/day: 1.00    Years: 3.00    Pack years: 3.00    Types: Cigarettes    Last attempt to quit: 03/17/1955    Years since quitting: 63.2  . Smokeless tobacco: Former UNetwork engineerUse Topics  . Alcohol use: Yes    Alcohol/week: 14.0 standard drinks    Types: 7 Cans of beer,  7 Standard drinks or equivalent per week    Comment: daily  . Drug use: No     Family Hx: The patient's family history includes Hypertension in his mother; Multiple sclerosis in his sister.  ROS:   Please see the history of present illness.    As stated in the HPI and negative for all other systems.   Labs/Other Tests and Data Reviewed:    Recent Labs: No results found for requested labs within last 8760 hours.   Recent Lipid Panel Lab Results  Component Value Date/Time   CHOL 147 11/09/2016 05:46 PM   TRIG 55 11/09/2016 05:46 PM   HDL 71 11/09/2016 05:46 PM   CHOLHDL 2.1 11/09/2016 05:46 PM  CHOLHDL 2.4 03/16/2011 05:10 AM   LDLCALC 65 11/09/2016 05:46 PM    Wt Readings from Last 3 Encounters:  05/26/18 172 lb (78 kg)  05/04/18 172 lb (78 kg)  03/28/18 173 lb 6.4 oz (78.7 kg)     Objective:    Vital Signs:  Ht _0  (1.803 m)   Wt 172 lb (78 kg)   BMI 23.99 kg/m      ASSESSMENT & PLAN:    ATRIAL FIBRILLATION:   Chad Russell has a CHA2DS2 - VASc score of 3   tolerates anticoagulation.  No change in therapy.    COMPLETE HEART BLOCK: He has had this for many years.  He has had this for many years and has no ongoing symptoms.  HTN:    His blood pressure he says is been well controlled but he was having trouble taking it today.   CHEST PAIN:     He was describing this previously but is no longer having this.  No further work-up.  COVID-19 Education: The signs and symptoms of COVID-19 were discussed with the patient and how to seek care for testing (follow up with PCP or arrange E-visit).  The importance of social distancing was discussed today.  I suggested that he not go to elective physical therapy.  He is extremely high risk he gets this virus.  Patient Risk:   After full review of this patient's clinical status, I feel that they are at least moderate risk at this time.  Time:   Today, I have spent 21 minutes with the patient with telehealth  technology discussing the above.     Medication Adjustments/Labs and Tests Ordered: Current medicines are reviewed at length with the patient today.  Concerns regarding medicines are outlined above.  Tests Ordered: No orders of the defined types were placed in this encounter.  Medication Changes: No orders of the defined types were placed in this encounter.   Disposition:  Follow up in 6 month(s)  Signed, Minus Breeding, MD  05/26/2018 4:04 PM    [facility]

## 2018-05-26 NOTE — Patient Instructions (Signed)

## 2018-06-20 ENCOUNTER — Encounter: Payer: Self-pay | Admitting: *Deleted

## 2018-06-20 ENCOUNTER — Telehealth (INDEPENDENT_AMBULATORY_CARE_PROVIDER_SITE_OTHER): Payer: Medicare HMO | Admitting: Neurology

## 2018-06-20 ENCOUNTER — Encounter: Payer: Self-pay | Admitting: Neurology

## 2018-06-20 ENCOUNTER — Other Ambulatory Visit: Payer: Self-pay

## 2018-06-20 VITALS — Ht 71.0 in | Wt 170.0 lb

## 2018-06-20 DIAGNOSIS — G609 Hereditary and idiopathic neuropathy, unspecified: Secondary | ICD-10-CM

## 2018-06-20 DIAGNOSIS — G20 Parkinson's disease: Secondary | ICD-10-CM | POA: Diagnosis not present

## 2018-06-20 MED ORDER — CARBIDOPA-LEVODOPA 25-100 MG PO TABS: 1.0000 | ORAL_TABLET | Freq: Three times a day (TID) | ORAL | 3 refills | Status: DC

## 2018-06-20 NOTE — Progress Notes (Signed)
    Virtual Visit via Telephone Note The purpose of this virtual visit is to provide medical care while limiting exposure to the novel coronavirus.    Consent was obtained for phone visit:  Yes.   Answered questions that patient had about telehealth interaction:  Yes.   I discussed the limitations, risks, security and privacy concerns of performing an evaluation and management service by telephone. I also discussed with the patient that there may be a patient responsible charge related to this service. The patient expressed understanding and agreed to proceed.  Pt location: Home Physician Location: office Name of referring provider:  [name], MD I connected with .Chad Russell at patients initiation/request on 06/20/2018 at  2:00 PM EDT by telephone and verified that I am speaking with the correct person using two identifiers.  Pt MRN:  [id number] Pt DOB:  20-Sep-1926   History of Present Illness: This is a 85 year old man returning for follow-up of neuropathy and parkinson disease.  He continues to have left hand tremor and ran out of his medications of Sinemet in January.  He has not noticed any new stiffness or worsening tremor despite being off medications.  His bigger issue is right hip and knee pain.  He has seen [facility] who performed corticosteroid injection and recommended PT.  Over the past few weeks, his right hip pain has become worse and feels unstable, limiting his ability to stand.  He is unable to walk unassisted and uses a Rollator.  He has not notified his orthopedic doctor.   Assessment and Plan:   1. Parkinson disease.  Encouraged compliant with sinemet 1 tablet three times daily.  Restart medication, titration schedule was discussed via telephone.  Refills were sent. 2. Peripheral neuropathy contributed by peripheral arterial disease, stable. 3.  Right hip pain causing gait instability.  I have urged him to follow-up with orthopedics.  Follow Up  Instructions:   I discussed the assessment and treatment plan with the patient. The patient was provided an opportunity to ask questions and all were answered. The patient agreed with the plan and demonstrated an understanding of the instructions.   The patient was advised to call back or seek an in-person evaluation if the symptoms worsen or if the condition fails to improve as anticipated.  Return to clinic in 4 months  Total Time spent in visit with the patient was:  25 min.   Pt understands and agrees with the plan of care outlined.     Alda Berthold, DO

## 2018-06-22 DIAGNOSIS — H3521 Other non-diabetic proliferative retinopathy, right eye: Secondary | ICD-10-CM | POA: Diagnosis not present

## 2018-06-22 DIAGNOSIS — H3561 Retinal hemorrhage, right eye: Secondary | ICD-10-CM | POA: Diagnosis not present

## 2018-06-22 DIAGNOSIS — H353211 Exudative age-related macular degeneration, right eye, with active choroidal neovascularization: Secondary | ICD-10-CM | POA: Diagnosis not present

## 2018-06-22 DIAGNOSIS — H34811 Central retinal vein occlusion, right eye, with macular edema: Secondary | ICD-10-CM | POA: Diagnosis not present

## 2018-06-27 ENCOUNTER — Ambulatory Visit: Payer: Medicare HMO | Admitting: Neurology

## 2018-07-05 ENCOUNTER — Encounter: Payer: Self-pay | Admitting: Podiatry

## 2018-07-05 ENCOUNTER — Other Ambulatory Visit: Payer: Self-pay

## 2018-07-05 ENCOUNTER — Ambulatory Visit: Payer: Medicare HMO | Admitting: Podiatry

## 2018-07-05 VITALS — Temp 97.0°F

## 2018-07-05 DIAGNOSIS — M79674 Pain in right toe(s): Secondary | ICD-10-CM | POA: Diagnosis not present

## 2018-07-05 DIAGNOSIS — B351 Tinea unguium: Secondary | ICD-10-CM | POA: Diagnosis not present

## 2018-07-05 DIAGNOSIS — M79675 Pain in left toe(s): Secondary | ICD-10-CM

## 2018-07-05 NOTE — Progress Notes (Signed)
Complaint:  Visit Type: Patient returns to my office for continued preventative foot care services. Complaint: Patient states" my nails have grown long and thick and become painful to walk and wear shoes"  The patient presents for preventative foot care services. No changes to ROS.  Patient has skin lesion on his outside left ankle.  Podiatric Exam: Vascular: dorsalis pedis and posterior tibial pulses are palpable bilateral. Capillary return is immediate. Temperature gradient is WNL. Skin turgor WNL  Sensorium: Normal Semmes Weinstein monofilament test. Normal tactile sensation bilaterally. Nail Exam: Pt has thick disfigured discolored nails with subungual debris noted bilateral entire nail hallux through fifth toenails.  Blackened unattached nail plate second toe right foot. Ulcer Exam: There is no evidence of ulcer or pre-ulcerative changes or infection. Orthopedic Exam: Muscle tone and strength are WNL. No limitations in general ROM. No crepitus or effusions noted. Foot type and digits show no abnormalities. Bony prominences are unremarkable. Skin: No Porokeratosis. No infection or ulcers.  Non draining skin lesion left ankle.  Diagnosis:  Onychomycosis, , Pain in right toe, pain in left toes  Treatment & Plan Procedures and Treatment: Consent by patient was obtained for treatment procedures.   Debridement of mycotic and hypertrophic toenails, 1 through 5 bilateral and clearing of subungual debris. No ulceration, no infection noted. Heel pillow was prescribed. Return Visit-Office Procedure: Patient instructed to return to the office for a follow up visit 3 months for continued evaluation and treatment.    Gardiner Barefoot DPM

## 2018-08-03 DIAGNOSIS — H401131 Primary open-angle glaucoma, bilateral, mild stage: Secondary | ICD-10-CM | POA: Diagnosis not present

## 2018-08-03 DIAGNOSIS — H3561 Retinal hemorrhage, right eye: Secondary | ICD-10-CM | POA: Diagnosis not present

## 2018-08-03 DIAGNOSIS — H34811 Central retinal vein occlusion, right eye, with macular edema: Secondary | ICD-10-CM | POA: Diagnosis not present

## 2018-08-09 ENCOUNTER — Other Ambulatory Visit: Payer: Self-pay | Admitting: Cardiology

## 2018-08-09 NOTE — Telephone Encounter (Signed)
Pt is a 65yom requesting xarelto 20mg  wt of 78kg, scr of (outdated)  Pt will need labs to get this refill Called pt and lmomed to call back to order labs

## 2018-08-14 ENCOUNTER — Other Ambulatory Visit: Payer: Self-pay | Admitting: Cardiology

## 2018-08-14 ENCOUNTER — Other Ambulatory Visit: Payer: Self-pay | Admitting: *Deleted

## 2018-08-14 DIAGNOSIS — I482 Chronic atrial fibrillation, unspecified: Secondary | ICD-10-CM | POA: Diagnosis not present

## 2018-08-14 MED ORDER — RIVAROXABAN 20 MG PO TABS: 20.0000 mg | ORAL_TABLET | Freq: Every day | ORAL | 1 refills | Status: DC

## 2018-08-14 NOTE — Telephone Encounter (Signed)
Pt came in for bloodwork today and requested refills for Xarelto for 90 day Rx. Sent refills to [facility] in [place] per pt request.

## 2018-08-15 LAB — CBC
Hematocrit: 38.3 % (ref 37.5–51.0)
Hemoglobin: 12.8 g/dL — ABNORMAL LOW (ref 13.0–17.7)
MCH: 32.6 pg (ref 26.6–33.0)
MCHC: 33.4 g/dL (ref 31.5–35.7)
MCV: 98 fL — ABNORMAL HIGH (ref 79–97)
Platelets: 260 10*3/uL (ref 150–450)
RBC: 3.93 x10E6/uL — ABNORMAL LOW (ref 4.14–5.80)
RDW: 13.2 % (ref 11.6–15.4)
WBC: 7.1 10*3/uL (ref 3.4–10.8)

## 2018-08-15 LAB — BASIC METABOLIC PANEL
BUN/Creatinine Ratio: 23 (ref 10–24)
BUN: 25 mg/dL (ref 10–36)
CO2: 23 mmol/L (ref 20–29)
Calcium: 9.4 mg/dL (ref 8.6–10.2)
Chloride: 103 mmol/L (ref 96–106)
Creatinine, Ser: 1.07 mg/dL (ref 0.76–1.27)
GFR calc Af Amer: 70 mL/min/{1.73_m2} (ref >59)
GFR calc non Af Amer: 61 mL/min/{1.73_m2} (ref >59)
Glucose: 80 mg/dL (ref 65–99)
Potassium: 4.2 mmol/L (ref 3.5–5.2)
Sodium: 144 mmol/L (ref 134–144)

## 2018-09-07 DIAGNOSIS — H34811 Central retinal vein occlusion, right eye, with macular edema: Secondary | ICD-10-CM | POA: Diagnosis not present

## 2018-09-07 DIAGNOSIS — H3582 Retinal ischemia: Secondary | ICD-10-CM | POA: Diagnosis not present

## 2018-09-07 DIAGNOSIS — H3521 Other non-diabetic proliferative retinopathy, right eye: Secondary | ICD-10-CM | POA: Diagnosis not present

## 2018-10-11 ENCOUNTER — Other Ambulatory Visit: Payer: Self-pay

## 2018-10-11 ENCOUNTER — Encounter: Payer: Self-pay | Admitting: Podiatry

## 2018-10-11 ENCOUNTER — Ambulatory Visit (INDEPENDENT_AMBULATORY_CARE_PROVIDER_SITE_OTHER): Payer: Medicare HMO | Admitting: Podiatry

## 2018-10-11 VITALS — Temp 97.6°F

## 2018-10-11 DIAGNOSIS — B351 Tinea unguium: Secondary | ICD-10-CM | POA: Diagnosis not present

## 2018-10-11 DIAGNOSIS — M79674 Pain in right toe(s): Secondary | ICD-10-CM

## 2018-10-11 DIAGNOSIS — M79675 Pain in left toe(s): Secondary | ICD-10-CM

## 2018-10-11 NOTE — Progress Notes (Signed)
Complaint:  Visit Type: Patient returns to my office for continued preventative foot care services. Complaint: Patient states" my nails have grown long and thick and become painful to walk and wear shoes"  The patient presents for preventative foot care services. No changes to ROS.  Resolved skin lesion left ankle.  Podiatric Exam: Vascular: dorsalis pedis and posterior tibial pulses are palpable bilateral. Capillary return is immediate. Temperature gradient is WNL. Skin turgor WNL  Sensorium: Normal Semmes Weinstein monofilament test. Normal tactile sensation bilaterally. Nail Exam: Pt has thick disfigured discolored nails with subungual debris noted bilateral entire nail hallux through fifth toenails.  Blackened unattached nail plate second toe right foot. Ulcer Exam: There is no evidence of ulcer or pre-ulcerative changes or infection. Orthopedic Exam: Muscle tone and strength are WNL. No limitations in general ROM. No crepitus or effusions noted. Foot type and digits show no abnormalities. Bony prominences are unremarkable. Skin: No Porokeratosis. No infection or ulcers.  Healed  skin lesion left ankle.  Diagnosis:  Onychomycosis, , Pain in right toe, pain in left toes  Treatment & Plan Procedures and Treatment: Consent by patient was obtained for treatment procedures.   Debridement of mycotic and hypertrophic toenails, 1 through 5 bilateral and clearing of subungual debris. No ulceration, no infection noted. Heel pillow was prescribed. Return Visit-Office Procedure: Patient instructed to return to the office for a follow up visit 3 months for continued evaluation and treatment.    Gardiner Barefoot DPM

## 2018-10-12 DIAGNOSIS — H353113 Nonexudative age-related macular degeneration, right eye, advanced atrophic without subfoveal involvement: Secondary | ICD-10-CM | POA: Diagnosis not present

## 2018-10-12 DIAGNOSIS — H3561 Retinal hemorrhage, right eye: Secondary | ICD-10-CM | POA: Diagnosis not present

## 2018-10-12 DIAGNOSIS — H353211 Exudative age-related macular degeneration, right eye, with active choroidal neovascularization: Secondary | ICD-10-CM | POA: Diagnosis not present

## 2018-10-12 DIAGNOSIS — H34811 Central retinal vein occlusion, right eye, with macular edema: Secondary | ICD-10-CM | POA: Diagnosis not present

## 2018-10-18 ENCOUNTER — Encounter: Payer: Self-pay | Admitting: Neurology

## 2018-10-20 ENCOUNTER — Telehealth (INDEPENDENT_AMBULATORY_CARE_PROVIDER_SITE_OTHER): Payer: Medicare HMO | Admitting: Neurology

## 2018-10-20 ENCOUNTER — Other Ambulatory Visit: Payer: Self-pay

## 2018-10-20 VITALS — Ht 71.0 in | Wt 170.0 lb

## 2018-10-20 DIAGNOSIS — G20 Parkinson's disease: Secondary | ICD-10-CM

## 2018-10-20 NOTE — Progress Notes (Signed)
    Virtual Visit via Telephone Note The purpose of this virtual visit is to provide medical care while limiting exposure to the novel coronavirus.    Consent was obtained for phone visit:  Yes.   Answered questions that patient had about telehealth interaction:  Yes.   I discussed the limitations, risks, security and privacy concerns of performing an evaluation and management service by telephone. I also discussed with the patient that there may be a patient responsible charge related to this service. The patient expressed understanding and agreed to proceed.  Pt location: Home Physician Location: office Name of referring provider:  [name], MD I connected with .Shelvin I Benedick at patients initiation/request on 10/20/2018 at 10:50 AM EDT by telephone and verified that I am speaking with the correct person using two identifiers.  Pt MRN:  [id number] Pt DOB:  1926/08/18   History of Present Illness: This is a 85 year old man returns for follow-up of neuropathy and Parkinson's disease.  He has been more compliant with taking Sinemet, but admits to often skipping his afternoon dose.  His hand tremors are improved.  He denies any stiffness or dysarthria.  He is able to bathe himself, and needs some assistance with washing his feet.  His wife helps with dressing.  Mood is doing well.  He has not had any falls.  He is dependent upon his walker and continues to have significant pain related right hip arthritis.     Assessment and Plan:   1. Parkinson disease.  Recommended that they add an alarm for medication reminder daily, as he often forgets his midday dose.  Continue Sinemet 1 tablet 3 times daily.   2. Peripheral neuropathy contributed by peripheral arterial disease, stable.   Follow Up Instructions:   I discussed the assessment and treatment plan with the patient. The patient was provided an opportunity to ask questions and all were answered. The patient agreed with the plan and  demonstrated an understanding of the instructions.   The patient was advised to call back or seek an in-person evaluation if the symptoms worsen or if the condition fails to improve as anticipated.  Return to clinic in 4 months  Total Time spent in visit with the patient was:  20 min.   Pt understands and agrees with the plan of care outlined.     Alda Berthold, DO

## 2018-10-23 DIAGNOSIS — H348111 Central retinal vein occlusion, right eye, with retinal neovascularization: Secondary | ICD-10-CM | POA: Diagnosis not present

## 2018-10-23 DIAGNOSIS — H3582 Retinal ischemia: Secondary | ICD-10-CM | POA: Diagnosis not present

## 2018-10-26 ENCOUNTER — Other Ambulatory Visit: Payer: Self-pay

## 2018-10-26 ENCOUNTER — Ambulatory Visit (INDEPENDENT_AMBULATORY_CARE_PROVIDER_SITE_OTHER): Payer: Medicare HMO | Admitting: Family Medicine

## 2018-10-26 DIAGNOSIS — Z23 Encounter for immunization: Secondary | ICD-10-CM

## 2018-11-02 ENCOUNTER — Encounter: Payer: Self-pay | Admitting: Family Medicine

## 2018-11-02 ENCOUNTER — Ambulatory Visit (INDEPENDENT_AMBULATORY_CARE_PROVIDER_SITE_OTHER): Payer: Medicare HMO | Admitting: Family Medicine

## 2018-11-02 ENCOUNTER — Other Ambulatory Visit: Payer: Self-pay

## 2018-11-02 VITALS — BP 128/62 | HR 32 | Temp 97.6°F | Resp 14 | Wt 174.0 lb

## 2018-11-02 DIAGNOSIS — M25561 Pain in right knee: Secondary | ICD-10-CM

## 2018-11-02 DIAGNOSIS — S81802A Unspecified open wound, left lower leg, initial encounter: Secondary | ICD-10-CM | POA: Diagnosis not present

## 2018-11-02 DIAGNOSIS — L989 Disorder of the skin and subcutaneous tissue, unspecified: Secondary | ICD-10-CM

## 2018-11-02 NOTE — Progress Notes (Signed)
Subjective:    Patient ID: Chad Russell, male    DOB: 1926-12-30, 85 y.o.   MRN: [id number]  HPI Chad Russell is a 85 y.o. male Presents today for: Chief Complaint  Patient presents with  . leg lesion    left leg lesion been there for a few years. Have an appt with Dermatologist in about 4 month  . outter ear    Have a funny feeling at the jaw line area in front of left ear. Feels like fluid maybe   Left leg lesion: Lesion on left upper outer calf area. No known injury.  Has been present for 1 year. No pain/soreness/itching. Has looked like a scab, just not going away.  No bleeding.  Tx: polysporin.   Left ear issue: Feels like a cyst or bump in front of left ear - scabs, drains at times. Present few months. No change in hearing, no internal ear issues.  Tx: shampoo only.   Derm: [facility] Dr. Fontaine No. last appt in Jan, next appt in few months.   At end of visit also had questions about his right knee/leg issues.  On chart review he was last seen February 11 by Dr.Xu with [facility] orthopedics.  Status post right partial hip replacement by Dr. Mardelle Matte approximately 1 year prior for femoral neck fracture.  Some persistent pain into the right knee with radiation to the thigh.  Question of referred pain from patellofemoral arthrosis.  Cortisone injection was given as well as outpatient physical therapy.  Plan for MRI to rule out structural abnormalities if not improving in 6 weeks.  Still having some soreness in thigh with certain amount of exercise. Restricts his amount of exercise.   Last visit with Dr. Erlinda Hong in March.     Patient Active Problem List   Diagnosis Date Noted  . Chest pain 05/04/2018  . Fracture of femoral neck, right ([place]) 04/19/2017  . MGUS (monoclonal gammopathy of unknown significance) 01/15/2017  . BPH with obstruction/lower urinary tract symptoms 09/07/2016  . Preoperative cardiovascular examination 08/24/2016  . Urinary retention  05/13/2016  . Balance problem 10/31/2013  . OSA (obstructive sleep apnea) 10/31/2013  . BPH (benign prostatic hyperplasia) 08/28/2012  . Atrial fibrillation ([place]) 06/08/2012  . Hyperplasia of prostate with lower urinary tract symptoms (LUTS) 04/28/2012  . Moderate malnutrition ([place]) 04/28/2012  . Infection of urinary tract - recurrent 04/26/2012  . NSTEMI (non-ST elevated myocardial infarction) ([place]) 03/15/2011  . Complete heart block ([place]) 03/15/2011  . HTN (hypertension) 03/15/2011   Past Medical History:  Diagnosis Date  . Arthritis   . Atrial fibrillation ([place])   . Balance problem 10/31/2013  . BPH (benign prostatic hyperplasia) 2014   Urology Dr [name] with [facility] [phone]   . Bradycardia    a. baseline HR in the 30's. Asymptomatic - no history of PPM placement.   Marland Kitchen CAD (coronary artery disease)    LHC 03/16/11: Mid LAD 10-20%, proximal circumflex 10%, mid RCA 20%, EF 55%.  . Cataract   . Complete heart block ([place])   . Fracture of femoral neck, right ([place]) 04/19/2017  . GERD (gastroesophageal reflux disease)    takes occasional  zantac  . Hx of echocardiogram    Echo 02/2011: EF 65-70%, normal wall motion, MAC, mild MR, mild LAE, mild RVE  . Hypertension   . OSA (obstructive sleep apnea)    per patient went to have sleep study done about 2 years ago, bu they never F/U with him  about results; but wife reports he still has periods of apnea at night   . Talar fracture    casted no surg   Past Surgical History:  Procedure Laterality Date  . ABDOMINAL AORTOGRAM W/LOWER EXTREMITY N/A 01/17/2017   Procedure: ABDOMINAL AORTOGRAM W/LOWER EXTREMITY;  Surgeon: Angelia Mould, MD;  Location: [place] CV LAB;  Service: Cardiovascular;  Laterality: N/A;  . COLONOSCOPY     Repeated and normal in 2011  . FRACTURE SURGERY    . HEMIARTHROPLASTY HIP Right 04/19/2017  . HEMORROIDECTOMY    . HIP ARTHROPLASTY Right 04/19/2017   Procedure: ARTHROPLASTY BIPOLAR HIP  (HEMIARTHROPLASTY);  Surgeon: Marchia Bond, MD;  Location: [place];  Service: Orthopedics;  Laterality: Right;  . INGUINAL HERNIA REPAIR Right 02/26/2014   Procedure: LAPAROSCOPIC RIGHT INGUINAL HERNIA REPAIR;  Surgeon: Ralene Ok, MD;  Location: [place];  Service: General;  Laterality: Right;  . INGUINAL HERNIA REPAIR    . INSERTION OF MESH Right 02/26/2014   Procedure: INSERTION OF MESH;  Surgeon: Ralene Ok, MD;  Location: [place];  Service: General;  Laterality: Right;  . LEFT HEART CATHETERIZATION WITH CORONARY ANGIOGRAM N/A 03/16/2011   Procedure: LEFT HEART CATHETERIZATION WITH CORONARY ANGIOGRAM;  Surgeon: Peter M Martinique, MD;  Location: [facility] CATH LAB;  Service: Cardiovascular;  Laterality: N/A;  . PROSTATE BIOPSY     negative - [facility]  . TRANSURETHRAL RESECTION OF PROSTATE N/A 09/07/2016   Procedure: TRANSURETHRAL RESECTION OF THE PROSTATE (TURP);  Surgeon: Irine Seal, MD;  Location: [facility];  Service: Urology;  Laterality: N/A;   Allergies  Allergen Reactions  . Doxycycline Other (See Comments)    Upset stomach  . Keflex [Cephalexin] Other (See Comments)    Abdominal discomfort 04-19-17 Pt has tolerated orally   Prior to Admission medications   Medication Sig Start Date End Date Taking? Authorizing Provider  acetaminophen (TYLENOL) 325 MG tablet Take 2 tablets (650 mg total) by mouth every 6 (six) hours as needed for moderate pain. Total acetaminophen intake not to exceed 4053m total from all sources per 24 hours. 04/22/17  Yes SAlphonzo Grieve MD  bisacodyl (DULCOLAX) 5 MG EC tablet Take 1 tablet (5 mg total) by mouth daily as needed for moderate constipation. 04/19/17  Yes LMarchia Bond MD  carbidopa-levodopa (SINEMET) 25-100 MG tablet Take 1 tablet by mouth 3 (three) times daily. 06/20/18  Yes Patel, Donika K, DO  clotrimazole (LOTRIMIN) 1 % cream Apply 1 application topically 2 (two) times daily. 10/27/17  Yes GWendie Agreste MD  hydrocortisone cream 1 % Apply 1  application topically daily as needed for itching. 04/22/17  Yes SAlphonzo Grieve MD  lisinopril-hydrochlorothiazide (PRINZIDE,ZESTORETIC) 10-12.5 MG tablet Take 1 tablet by mouth daily. 01/31/18  Yes HMinus Breeding MD  LUTEIN-ZEAXANTHIN PO Take 1 tablet daily by mouth.    Yes [provider]  magnesium citrate solution Take 148 mLs by mouth daily. 04/22/17  Yes SAlphonzo Grieve MD  Misc Natural Products (PROSTATE) CAPS Take 1 tablet daily by mouth. Prostate Plus otc supplement   Yes [provider]  Multiple Vitamin (MULTIVITAMIN WITH MINERALS) TABS tablet Take 1 tablet daily by mouth.   Yes [provider]  Probiotic Product (PROBIOTIC DAILY PO) Take 1 tablet by mouth daily.   Yes [provider]  rivaroxaban (XARELTO) 20 MG TABS tablet Take 1 tablet (20 mg total) by mouth daily with supper. 08/14/18  Yes HMinus Breeding MD  timolol (TIMOPTIC) 0.5 % ophthalmic solution One drop in both eyes  in the morning and one drop in the left eye at night. Patient taking differently: Place 2 drops into both eyes. One drop in both eyes in the morning and one drop in the left eye at night. 04/22/17  Yes Alphonzo Grieve, MD  vitamin C (ASCORBIC ACID) 500 MG tablet Take 500 mg by mouth daily.   Yes [provider]   Social History   Socioeconomic History  . Marital status: Married    Spouse name: Mardene Celeste  . Number of children: 0  . Years of education: 79  . Highest education level: Not on file  Occupational History  . Occupation: Scientist, product/process development  . Occupation: Civil engineer, contracting  Social Needs  . Financial resource strain: Not on file  . Food insecurity    Worry: Not on file    Inability: Not on file  . Transportation needs    Medical: Not on file    Non-medical: Not on file  Tobacco Use  . Smoking status: Former Smoker    Packs/day: 1.00    Years: 3.00    Pack years: 3.00    Types: Cigarettes    Quit date: 03/17/1955    Years since quitting: 63.6  .  Smokeless tobacco: Former Network engineer and Sexual Activity  . Alcohol use: Yes    Alcohol/week: 14.0 standard drinks    Types: 7 Cans of beer, 7 Standard drinks or equivalent per week    Comment: daily  . Drug use: No  . Sexual activity: Not Currently    Birth control/protection: None  Lifestyle  . Physical activity    Days per week: Not on file    Minutes per session: Not on file  . Stress: Not on file  Relationships  . Social Herbalist on phone: Not on file    Gets together: Not on file    Attends religious service: Not on file    Active member of club or organization: Not on file    Attends meetings of clubs or organizations: Not on file    Relationship status: Not on file  . Intimate partner violence    Fear of current or ex partner: Not on file    Emotionally abused: Not on file    Physically abused: Not on file    Forced sexual activity: Not on file  Other Topics Concern  . 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  Has no children.     Right handed     Review of Systems Per HPi.     Objective:   Physical Exam Vitals signs reviewed.  Constitutional:      General: He is not in acute distress.    Appearance: He is well-developed.  HENT:     Head: Normocephalic and atraumatic.      Right Ear: Tympanic membrane, ear canal and external ear normal.     Left Ear: Tympanic membrane, ear canal and external ear normal.  Cardiovascular:     Rate and Rhythm: Normal rate.  Pulmonary:     Effort: Pulmonary effort is normal.  Skin:      Neurological:     Mental Status: He is alert and oriented to person, place, and time.    Vitals:  11/02/18 1507  BP: 128/62  Pulse: (!) 32  Resp: 14  Temp: 97.6 F (36.4 C)  TempSrc: Oral  SpO2: 97%  Weight: 174 lb (78.9 kg)          Assessment & Plan:  QUANTRELL SPLITT is a 85 y.o. male Lesion of face - Plan: Ambulatory referral to Dermatology Wound of left lower extremity, initial encounter - Plan: Ambulatory referral to Dermatology Skin lesion of left leg - Plan: Ambulatory referral to Dermatology  -Differential includes squamous cell versus nonhealing wound of left leg.  Refer to dermatology for evaluation and probable biopsy/removal  -Dry skin of face, also with squamous cell differential.  Aveeno lotion or other lotion to dry areas with dermatology eval.  Right knee pain, unspecified chronicity  -Recommended discussion with previous orthopedist on next step in evaluation/treatment as MRI has been mentioned if persistent issue.  Advised to let me know if he requests other provider evaluation.  No orders of the defined types were placed in this encounter.  Patient Instructions    I will refer you to dermatology.  Okay to apply Polysporin to lower leg lesion.  Aveeno or other lotion to the area in front of the left ear. Return to the clinic or go to the nearest emergency room if any of your symptoms worsen or new symptoms occur.  Call Dr. Erlinda Hong to discuss next step in your leg pain. Let me know if there are other questions after speaking to him.    If you have lab work done today you will be contacted with your lab results within the next 2 weeks.  If you have not heard from [place] then please contact us. The fastest way to get your results is to register for My Chart.   IF you received an x-ray today, you will receive an invoice from [facility]. Please contact [facility] at [phone] with questions or concerns regarding your invoice.   IF you received labwork today, you will receive an invoice from [facility]. Please contact [facility] at [phone] with questions or concerns regarding your invoice.   Our billing staff will not be able to assist you with questions regarding bills from  these companies.  You will be contacted with the lab results as soon as they are available. The fastest way to get your results is to activate your My Chart account. Instructions are located on the last page of this paperwork. If you have not heard from [place] regarding the results in 2 weeks, please contact this office.       Signed,   [name], MD [facility].  11/02/18 6:46 PM

## 2018-11-02 NOTE — Patient Instructions (Addendum)
  I will refer you to dermatology.  Okay to apply Polysporin to lower leg lesion.  Aveeno or other lotion to the area in front of the left ear. Return to the clinic or go to the nearest emergency room if any of your symptoms worsen or new symptoms occur.  Call Dr. Erlinda Hong to discuss next step in your leg pain. Let me know if there are other questions after speaking to him.    If you have lab work done today you will be contacted with your lab results within the next 2 weeks.  If you have not heard from [place] then please contact us. The fastest way to get your results is to register for My Chart.   IF you received an x-ray today, you will receive an invoice from [facility]. Please contact [facility] at [phone] with questions or concerns regarding your invoice.   IF you received labwork today, you will receive an invoice from [facility]. Please contact [facility] at [phone] with questions or concerns regarding your invoice.   Our billing staff will not be able to assist you with questions regarding bills from these companies.  You will be contacted with the lab results as soon as they are available. The fastest way to get your results is to activate your My Chart account. Instructions are located on the last page of this paperwork. If you have not heard from [place] regarding the results in 2 weeks, please contact this office.

## 2018-11-09 DIAGNOSIS — D1801 Hemangioma of skin and subcutaneous tissue: Secondary | ICD-10-CM | POA: Diagnosis not present

## 2018-11-09 DIAGNOSIS — C44319 Basal cell carcinoma of skin of other parts of face: Secondary | ICD-10-CM | POA: Diagnosis not present

## 2018-11-09 DIAGNOSIS — D485 Neoplasm of uncertain behavior of skin: Secondary | ICD-10-CM | POA: Diagnosis not present

## 2018-11-09 DIAGNOSIS — L570 Actinic keratosis: Secondary | ICD-10-CM | POA: Diagnosis not present

## 2018-11-09 DIAGNOSIS — Z85828 Personal history of other malignant neoplasm of skin: Secondary | ICD-10-CM | POA: Diagnosis not present

## 2018-11-09 DIAGNOSIS — L821 Other seborrheic keratosis: Secondary | ICD-10-CM | POA: Diagnosis not present

## 2018-11-09 DIAGNOSIS — L820 Inflamed seborrheic keratosis: Secondary | ICD-10-CM | POA: Diagnosis not present

## 2018-11-23 DIAGNOSIS — H3521 Other non-diabetic proliferative retinopathy, right eye: Secondary | ICD-10-CM | POA: Diagnosis not present

## 2018-11-23 DIAGNOSIS — H34811 Central retinal vein occlusion, right eye, with macular edema: Secondary | ICD-10-CM | POA: Diagnosis not present

## 2018-11-23 NOTE — Progress Notes (Signed)
HPI The patient presents for followup of CHB and atrial fib.   He has no cardiac complaints.  He never feels palpitations.  He never has presyncope or syncope.  He tolerates his anticoagulation.  He has had no shortness of breath, PND or orthopnea.  He denies any chest discomfort.  His biggest problem has been orthopedic with particular discomfort in his hip that was operated on before.  He has been told he might need another surgery on this.  He wants to get an opinion at [facility].   Allergies  Allergen Reactions  . Doxycycline Other (See Comments)    Upset stomach  . Keflex [Cephalexin] Other (See Comments)    Abdominal discomfort 04-19-17 Pt has tolerated orally    Current Outpatient Medications  Medication Sig Dispense Refill  . acetaminophen (TYLENOL) 325 MG tablet Take 2 tablets (650 mg total) by mouth every 6 (six) hours as needed for moderate pain. Total acetaminophen intake not to exceed 4074m total from all sources per 24 hours. 30 tablet 0  . bisacodyl (DULCOLAX) 5 MG EC tablet Take 1 tablet (5 mg total) by mouth daily as needed for moderate constipation. 30 tablet 0  . carbidopa-levodopa (SINEMET) 25-100 MG tablet Take 1 tablet by mouth 3 (three) times daily. 270 tablet 3  . clotrimazole (LOTRIMIN) 1 % cream Apply 1 application topically 2 (two) times daily. 30 g 0  . hydrocortisone cream 1 % Apply 1 application topically daily as needed for itching. 30 g 0  . lisinopril-hydrochlorothiazide (PRINZIDE,ZESTORETIC) 10-12.5 MG tablet Take 1 tablet by mouth daily. 90 tablet 3  . LUTEIN-ZEAXANTHIN PO Take 1 tablet daily by mouth.     . magnesium citrate solution Take 148 mLs by mouth daily. 300 mL 0  . Misc Natural Products (PROSTATE) CAPS Take 1 tablet daily by mouth. Prostate Plus otc supplement    . Multiple Vitamin (MULTIVITAMIN WITH MINERALS) TABS tablet Take 1 tablet daily by mouth.    . Probiotic Product (PROBIOTIC DAILY PO) Take 1 tablet by mouth daily.    .  rivaroxaban (XARELTO) 20 MG TABS tablet Take 1 tablet (20 mg total) by mouth daily with supper. 90 tablet 1  . timolol (TIMOPTIC) 0.5 % ophthalmic solution One drop in both eyes in the morning and one drop in the left eye at night. (Patient taking differently: Place 2 drops into both eyes. One drop in both eyes in the morning and one drop in the left eye at night.) 10 mL 12  . vitamin C (ASCORBIC ACID) 500 MG tablet Take 500 mg by mouth daily.     No current facility-administered medications for this visit.     Past Medical History:  Diagnosis Date  . Arthritis   . Atrial [place] ([place]   . Balance problem 10/31/2013  . BPH (benign prostatic hyperplasia) 2014   [phone]  . Bradycardia    a. baseline HR in the 30's. Asymptomatic - no history of PPM placement.   .Marland KitchenCAD (coronary artery disease)    LHC 03/16/11: Mid LAD 10-20%, proximal circumflex 10%, mid RCA 20%, EF 55%.  . Cataract   . Complete heart block ([place]   . Fracture of [place] neck, right ([place] 04/19/2017  . GERD (gastroesophageal reflux disease)    takes occasional  zantac  . Hx of echocardiogram    Echo 02/2011: EF 65-70%, normal wall motion, MAC, mild MR, mild LAE, mild RVE  .  Hypertension   . OSA (obstructive sleep apnea)    per patient went to have sleep study done about 2 years ago, bu they never F/U with him about results; but wife reports he still has periods of apnea at night   . Talar fracture    casted no surg    Past Surgical History:  Procedure Laterality Date  . ABDOMINAL AORTOGRAM W/LOWER EXTREMITY N/A 01/17/2017   Procedure: ABDOMINAL AORTOGRAM W/LOWER EXTREMITY;  Surgeon: Chad Mould, MD;  Location: [place] CV LAB;  Service: Cardiovascular;  Laterality: N/A;  . COLONOSCOPY     Repeated and normal in 2011  . FRACTURE SURGERY    . HEMIARTHROPLASTY HIP Right 04/19/2017  . HEMORROIDECTOMY    . HIP ARTHROPLASTY Right 04/19/2017   Procedure: ARTHROPLASTY BIPOLAR  HIP (HEMIARTHROPLASTY);  Surgeon: Chad Bond, MD;  Location: [place];  Service: Orthopedics;  Laterality: Right;  . INGUINAL HERNIA REPAIR Right 02/26/2014   Procedure: LAPAROSCOPIC RIGHT INGUINAL HERNIA REPAIR;  Surgeon: Chad Ok, MD;  Location: [place];  Service: General;  Laterality: Right;  . INGUINAL HERNIA REPAIR    . INSERTION OF MESH Right 02/26/2014   Procedure: INSERTION OF MESH;  Surgeon: Chad Ok, MD;  Location: [place];  Service: General;  Laterality: Right;  . LEFT HEART CATHETERIZATION WITH CORONARY ANGIOGRAM N/A 03/16/2011   Procedure: LEFT HEART CATHETERIZATION WITH CORONARY ANGIOGRAM;  Surgeon: Chad M Martinique, MD;  Location: [facility] CATH LAB;  Service: Cardiovascular;  Laterality: N/A;  . PROSTATE BIOPSY     negative - [facility]  . TRANSURETHRAL RESECTION OF PROSTATE N/A 09/07/2016   Procedure: TRANSURETHRAL RESECTION OF THE PROSTATE (TURP);  Surgeon: Chad Seal, MD;  [facility];  Service: Urology;  Laterality: N/A;    ROS:   As stated in the HPI and negative for all other systems.  . PHYSICAL EXAM BP 128/68   Pulse (!) 35   Temp (!) 96.9 F (36.1 C)   Ht _0  (1.803 m)   Wt 173 lb 6.4 oz (78.7 kg)   SpO2 100%   BMI 24.18 kg/m   GENERAL: Frail appearing NECK:  No jugular venous distention, waveform within normal limits, carotid upstroke brisk and symmetric, no bruits, no thyromegaly LUNGS:  Clear to auscultation bilaterally CHEST:  Unremarkable HEART:  PMI not displaced or sustained,S1 and S2 within normal limits, no S3, no S4, no clicks, no rubs, no murmurs ABD:  Flat, positive bowel sounds normal in frequency in pitch, no bruits, no rebound, no guarding, no midline pulsatile mass, no hepatomegaly, no splenomegaly EXT:  2 plus pulses throughout, no edema, no cyanosis no clubbing    EKG: Atrial fibrillation, junctional escape rhythm, rate 35, unchanged from previous.  11/24/2018    ASSESSMENT AND PLAN   ATRIAL FIBRILLATION:   [name] has a CHA2DS2 - VASc score of 3 he tolerates anticoagulation.  No change in therapy.  COMPLETE HEART BLOCK: He has had this for many years.  He has never had symptoms and a pacemaker was not indicated.  He still has no symptoms.  HTN: his blood pressure is controlled and he will continue meds as listed.   CHEST PAIN:    He had minimal coronary disease in 2013.  He has had no recurrent chest pain.  No further work-up.   PREOP: I do not think he would be at prohibitive risk for surgery were he to have hip surgery.  He is going to go ahead and explore  this as an option given his limiting hip discomfort.

## 2018-11-24 ENCOUNTER — Ambulatory Visit (INDEPENDENT_AMBULATORY_CARE_PROVIDER_SITE_OTHER): Payer: Medicare HMO | Admitting: Cardiology

## 2018-11-24 ENCOUNTER — Other Ambulatory Visit: Payer: Self-pay

## 2018-11-24 ENCOUNTER — Encounter: Payer: Self-pay | Admitting: Cardiology

## 2018-11-24 VITALS — BP 128/68 | HR 35 | Temp 96.9°F | Ht 71.0 in | Wt 173.4 lb

## 2018-11-24 DIAGNOSIS — I10 Essential (primary) hypertension: Secondary | ICD-10-CM | POA: Diagnosis not present

## 2018-11-24 DIAGNOSIS — I442 Atrioventricular block, complete: Secondary | ICD-10-CM | POA: Diagnosis not present

## 2018-11-24 DIAGNOSIS — I4891 Unspecified atrial fibrillation: Secondary | ICD-10-CM | POA: Diagnosis not present

## 2018-11-24 DIAGNOSIS — I482 Chronic atrial fibrillation, unspecified: Secondary | ICD-10-CM

## 2018-11-24 NOTE — Patient Instructions (Addendum)

## 2018-12-08 ENCOUNTER — Telehealth: Payer: Self-pay | Admitting: Cardiology

## 2018-12-08 NOTE — Telephone Encounter (Signed)
No message needed °

## 2018-12-12 DIAGNOSIS — Z85828 Personal history of other malignant neoplasm of skin: Secondary | ICD-10-CM | POA: Diagnosis not present

## 2018-12-12 DIAGNOSIS — C44319 Basal cell carcinoma of skin of other parts of face: Secondary | ICD-10-CM | POA: Diagnosis not present

## 2018-12-14 ENCOUNTER — Other Ambulatory Visit: Payer: Self-pay

## 2018-12-14 ENCOUNTER — Telehealth: Payer: Self-pay | Admitting: Family Medicine

## 2018-12-14 ENCOUNTER — Emergency Department (HOSPITAL_COMMUNITY): Payer: Medicare HMO

## 2018-12-14 ENCOUNTER — Inpatient Hospital Stay (HOSPITAL_COMMUNITY)
Admission: EM | Admit: 2018-12-14 | Discharge: 2018-12-26 | DRG: 522 | Disposition: A | Discharge status: Skilled Nursing Facility | Payer: Medicare HMO | Attending: Internal Medicine | Admitting: Internal Medicine

## 2018-12-14 DIAGNOSIS — I4891 Unspecified atrial fibrillation: Secondary | ICD-10-CM | POA: Diagnosis not present

## 2018-12-14 DIAGNOSIS — N138 Other obstructive and reflux uropathy: Secondary | ICD-10-CM | POA: Diagnosis not present

## 2018-12-14 DIAGNOSIS — Z79899 Other long term (current) drug therapy: Secondary | ICD-10-CM

## 2018-12-14 DIAGNOSIS — I2510 Atherosclerotic heart disease of native coronary artery without angina pectoris: Secondary | ICD-10-CM | POA: Diagnosis present

## 2018-12-14 DIAGNOSIS — M80052D Age-related osteoporosis with current pathological fracture, left femur, subsequent encounter for fracture with routine healing: Secondary | ICD-10-CM | POA: Diagnosis not present

## 2018-12-14 DIAGNOSIS — E860 Dehydration: Secondary | ICD-10-CM | POA: Diagnosis present

## 2018-12-14 DIAGNOSIS — S72002A Fracture of unspecified part of neck of left femur, initial encounter for closed fracture: Secondary | ICD-10-CM | POA: Diagnosis not present

## 2018-12-14 DIAGNOSIS — W1830XA Fall on same level, unspecified, initial encounter: Secondary | ICD-10-CM | POA: Diagnosis present

## 2018-12-14 DIAGNOSIS — Z20828 Contact with and (suspected) exposure to other viral communicable diseases: Secondary | ICD-10-CM | POA: Diagnosis present

## 2018-12-14 DIAGNOSIS — D62 Acute posthemorrhagic anemia: Secondary | ICD-10-CM | POA: Diagnosis not present

## 2018-12-14 DIAGNOSIS — Z85828 Personal history of other malignant neoplasm of skin: Secondary | ICD-10-CM

## 2018-12-14 DIAGNOSIS — R001 Bradycardia, unspecified: Secondary | ICD-10-CM | POA: Diagnosis not present

## 2018-12-14 DIAGNOSIS — S0990XA Unspecified injury of head, initial encounter: Secondary | ICD-10-CM | POA: Diagnosis not present

## 2018-12-14 DIAGNOSIS — K219 Gastro-esophageal reflux disease without esophagitis: Secondary | ICD-10-CM | POA: Diagnosis present

## 2018-12-14 DIAGNOSIS — Z881 Allergy status to other antibiotic agents status: Secondary | ICD-10-CM

## 2018-12-14 DIAGNOSIS — Z8249 Family history of ischemic heart disease and other diseases of the circulatory system: Secondary | ICD-10-CM

## 2018-12-14 DIAGNOSIS — N400 Enlarged prostate without lower urinary tract symptoms: Secondary | ICD-10-CM | POA: Diagnosis present

## 2018-12-14 DIAGNOSIS — I482 Chronic atrial fibrillation, unspecified: Secondary | ICD-10-CM | POA: Diagnosis not present

## 2018-12-14 DIAGNOSIS — Z888 Allergy status to other drugs, medicaments and biological substances status: Secondary | ICD-10-CM

## 2018-12-14 DIAGNOSIS — S7292XA Unspecified fracture of left femur, initial encounter for closed fracture: Secondary | ICD-10-CM | POA: Diagnosis not present

## 2018-12-14 DIAGNOSIS — I459 Conduction disorder, unspecified: Secondary | ICD-10-CM | POA: Diagnosis not present

## 2018-12-14 DIAGNOSIS — M6281 Muscle weakness (generalized): Secondary | ICD-10-CM | POA: Diagnosis not present

## 2018-12-14 DIAGNOSIS — I252 Old myocardial infarction: Secondary | ICD-10-CM | POA: Diagnosis not present

## 2018-12-14 DIAGNOSIS — Z471 Aftercare following joint replacement surgery: Secondary | ICD-10-CM | POA: Diagnosis not present

## 2018-12-14 DIAGNOSIS — F0280 Dementia in other diseases classified elsewhere without behavioral disturbance: Secondary | ICD-10-CM | POA: Diagnosis not present

## 2018-12-14 DIAGNOSIS — D472 Monoclonal gammopathy: Secondary | ICD-10-CM | POA: Diagnosis present

## 2018-12-14 DIAGNOSIS — Z87891 Personal history of nicotine dependence: Secondary | ICD-10-CM

## 2018-12-14 DIAGNOSIS — Z96641 Presence of right artificial hip joint: Secondary | ICD-10-CM | POA: Diagnosis present

## 2018-12-14 DIAGNOSIS — R52 Pain, unspecified: Secondary | ICD-10-CM | POA: Diagnosis not present

## 2018-12-14 DIAGNOSIS — G629 Polyneuropathy, unspecified: Secondary | ICD-10-CM | POA: Diagnosis present

## 2018-12-14 DIAGNOSIS — I4510 Unspecified right bundle-branch block: Secondary | ICD-10-CM | POA: Diagnosis present

## 2018-12-14 DIAGNOSIS — G20 Parkinson's disease: Secondary | ICD-10-CM

## 2018-12-14 DIAGNOSIS — Z820 Family history of epilepsy and other diseases of the nervous system: Secondary | ICD-10-CM

## 2018-12-14 DIAGNOSIS — I10 Essential (primary) hypertension: Secondary | ICD-10-CM | POA: Diagnosis not present

## 2018-12-14 DIAGNOSIS — S299XXA Unspecified injury of thorax, initial encounter: Secondary | ICD-10-CM | POA: Diagnosis not present

## 2018-12-14 DIAGNOSIS — Z9181 History of falling: Secondary | ICD-10-CM | POA: Diagnosis not present

## 2018-12-14 DIAGNOSIS — Z7401 Bed confinement status: Secondary | ICD-10-CM | POA: Diagnosis not present

## 2018-12-14 DIAGNOSIS — Z7901 Long term (current) use of anticoagulants: Secondary | ICD-10-CM | POA: Diagnosis not present

## 2018-12-14 DIAGNOSIS — M79662 Pain in left lower leg: Secondary | ICD-10-CM | POA: Diagnosis not present

## 2018-12-14 DIAGNOSIS — G4733 Obstructive sleep apnea (adult) (pediatric): Secondary | ICD-10-CM | POA: Diagnosis not present

## 2018-12-14 DIAGNOSIS — R0781 Pleurodynia: Secondary | ICD-10-CM | POA: Diagnosis not present

## 2018-12-14 DIAGNOSIS — E872 Acidosis: Secondary | ICD-10-CM | POA: Diagnosis present

## 2018-12-14 DIAGNOSIS — H269 Unspecified cataract: Secondary | ICD-10-CM | POA: Diagnosis present

## 2018-12-14 DIAGNOSIS — S72002D Fracture of unspecified part of neck of left femur, subsequent encounter for closed fracture with routine healing: Secondary | ICD-10-CM | POA: Diagnosis not present

## 2018-12-14 DIAGNOSIS — S0003XA Contusion of scalp, initial encounter: Secondary | ICD-10-CM | POA: Diagnosis not present

## 2018-12-14 DIAGNOSIS — I442 Atrioventricular block, complete: Secondary | ICD-10-CM | POA: Diagnosis present

## 2018-12-14 DIAGNOSIS — Z23 Encounter for immunization: Secondary | ICD-10-CM | POA: Diagnosis not present

## 2018-12-14 DIAGNOSIS — M2550 Pain in unspecified joint: Secondary | ICD-10-CM | POA: Diagnosis not present

## 2018-12-14 DIAGNOSIS — N179 Acute kidney failure, unspecified: Secondary | ICD-10-CM | POA: Diagnosis not present

## 2018-12-14 DIAGNOSIS — T148XXA Other injury of unspecified body region, initial encounter: Secondary | ICD-10-CM

## 2018-12-14 DIAGNOSIS — N401 Enlarged prostate with lower urinary tract symptoms: Secondary | ICD-10-CM | POA: Diagnosis not present

## 2018-12-14 DIAGNOSIS — R499 Unspecified voice and resonance disorder: Secondary | ICD-10-CM | POA: Diagnosis not present

## 2018-12-14 DIAGNOSIS — Z9079 Acquired absence of other genital organ(s): Secondary | ICD-10-CM

## 2018-12-14 DIAGNOSIS — R2689 Other abnormalities of gait and mobility: Secondary | ICD-10-CM | POA: Diagnosis not present

## 2018-12-14 DIAGNOSIS — Z96642 Presence of left artificial hip joint: Secondary | ICD-10-CM | POA: Diagnosis not present

## 2018-12-14 DIAGNOSIS — R1310 Dysphagia, unspecified: Secondary | ICD-10-CM

## 2018-12-14 DIAGNOSIS — M1990 Unspecified osteoarthritis, unspecified site: Secondary | ICD-10-CM | POA: Diagnosis present

## 2018-12-14 DIAGNOSIS — M79605 Pain in left leg: Secondary | ICD-10-CM

## 2018-12-14 LAB — CBC WITH DIFFERENTIAL/PLATELET
Abs Immature Granulocytes: 0.07 10*3/uL (ref 0.00–0.07)
Basophils Absolute: 0 10*3/uL (ref 0.0–0.1)
Basophils Relative: 0 %
Eosinophils Absolute: 0 10*3/uL (ref 0.0–0.5)
Eosinophils Relative: 0 %
HCT: 43.6 % (ref 39.0–52.0)
Hemoglobin: 13.9 g/dL (ref 13.0–17.0)
Immature Granulocytes: 0 %
Lymphocytes Relative: 5 %
Lymphs Abs: 0.8 10*3/uL (ref 0.7–4.0)
MCH: 32 pg (ref 26.0–34.0)
MCHC: 31.9 g/dL (ref 30.0–36.0)
MCV: 100.5 fL — ABNORMAL HIGH (ref 80.0–100.0)
Monocytes Absolute: 0.8 10*3/uL (ref 0.1–1.0)
Monocytes Relative: 5 %
Neutro Abs: 15.1 10*3/uL — ABNORMAL HIGH (ref 1.7–7.7)
Neutrophils Relative %: 90 %
Platelets: 203 10*3/uL (ref 150–400)
RBC: 4.34 MIL/uL (ref 4.22–5.81)
RDW: 14.1 % (ref 11.5–15.5)
WBC: 16.8 10*3/uL — ABNORMAL HIGH (ref 4.0–10.5)
nRBC: 0 % (ref 0.0–0.2)

## 2018-12-14 LAB — BASIC METABOLIC PANEL
Anion gap: 13 (ref 5–15)
BUN: 42 mg/dL — ABNORMAL HIGH (ref 8–23)
CO2: 21 mmol/L — ABNORMAL LOW (ref 22–32)
Calcium: 9.5 mg/dL (ref 8.9–10.3)
Chloride: 105 mmol/L (ref 98–111)
Creatinine, Ser: 1.42 mg/dL — ABNORMAL HIGH (ref 0.61–1.24)
GFR calc Af Amer: 50 mL/min — ABNORMAL LOW (ref >60)
GFR calc non Af Amer: 43 mL/min — ABNORMAL LOW (ref >60)
Glucose, Bld: 152 mg/dL — ABNORMAL HIGH (ref 70–99)
Potassium: 4.4 mmol/L (ref 3.5–5.1)
Sodium: 139 mmol/L (ref 135–145)

## 2018-12-14 LAB — PROTIME-INR
INR: 1.9 — ABNORMAL HIGH (ref 0.8–1.2)
Prothrombin Time: 21.8 seconds — ABNORMAL HIGH (ref 11.4–15.2)

## 2018-12-14 LAB — APTT: aPTT: 33 seconds (ref 24–36)

## 2018-12-14 MED ORDER — MORPHINE SULFATE (PF) 2 MG/ML IV SOLN: 0.5000 mg | INTRAVENOUS | Status: DC | PRN

## 2018-12-14 MED ORDER — HYDROCODONE-ACETAMINOPHEN 5-325 MG PO TABS
1.0000 | ORAL_TABLET | Freq: Four times a day (QID) | ORAL | Status: DC | PRN
  Administered 2018-12-17 – 2018-12-18 (×2): 1 via ORAL
  Filled 2018-12-14 (×2): qty 1

## 2018-12-14 MED ORDER — ACETAMINOPHEN 500 MG PO TABS
500.0000 mg | ORAL_TABLET | Freq: Once | ORAL | Status: AC
  Administered 2018-12-14: 500 mg via ORAL
  Filled 2018-12-14: qty 1

## 2018-12-14 MED ORDER — MORPHINE SULFATE (PF) 2 MG/ML IV SOLN
2.0000 mg | Freq: Once | INTRAVENOUS | Status: DC | PRN
  Filled 2018-12-14: qty 1

## 2018-12-14 MED ORDER — CARBIDOPA-LEVODOPA 25-100 MG PO TABS
1.0000 | ORAL_TABLET | Freq: Three times a day (TID) | ORAL | Status: DC
  Administered 2018-12-15 – 2018-12-26 (×36): 1 via ORAL
  Filled 2018-12-14 (×36): qty 1

## 2018-12-14 MED ORDER — SENNOSIDES-DOCUSATE SODIUM 8.6-50 MG PO TABS: 1.0000 | ORAL_TABLET | Freq: Every evening | ORAL | Status: DC | PRN

## 2018-12-14 MED ORDER — SODIUM CHLORIDE 0.9 % IV BOLUS
1000.0000 mL | Freq: Once | INTRAVENOUS | Status: AC
  Administered 2018-12-14: 17:00:00 1000 mL via INTRAVENOUS

## 2018-12-14 MED ORDER — BISACODYL 5 MG PO TBEC: 5.0000 mg | DELAYED_RELEASE_TABLET | Freq: Every day | ORAL | Status: DC | PRN

## 2018-12-14 NOTE — ED Notes (Signed)
Called to give report to 5N. Nurse not available at this time.

## 2018-12-14 NOTE — H&P (Addendum)
History and Physical    Chad Russell DOB: 19-Jun-1926 DOA: 12/14/2018  PCP: [name], MD  Patient coming from: Home, wife at bedside  I have personally briefly reviewed patient's old medical records in [place]  Chief Complaint: Fall and left hip pain  HPI: Chad Russell is a 85 y.o. male with medical history significant of Parkinson's disease, complete heart block (had for 20 years with no indication for pacemaker), CAD, atrial fibrillation on Xarelto, hypertension, OSA not on CPAP with concerns of left hip pain status post a fall last night.  Patient normally walks with a walker but did not use it last night and had a mechanical fall in the foyer of his home.  This morning he was unable to bear weight or get out of bed so EMS was called.  Patient denies history of frequent falls.  He denies any lightheadedness or dizziness prior to the fall. He hit his head but no loss of consciousness.  No chest pain or shortness of breath.  He is on Xarelto for his atrial fibrillation and last took this at 9 PM last night. Currently, patient is comfortable.  Denies any pain unless with movement of his left lower extremity.  Notes pain to his left lower ribs.  ED Course: He was afebrile and normotensive on room air.  He has bradycardic in the 40s.  CBC showed leukocytosis of 16.8 but no anemia.  BMP showed elevated creatinine of 1.42 from a prior of normal.  CT head showed no acute findings.  No fractures in left hip and chest x-ray.  Hip and femur x-ray showed acute fracture of the left femoral neck.  Review of Systems:  Constitutional: No Weight Change, No Fever ENT/Mouth: No sore throat, No Rhinorrhea Eyes: No Eye Pain, No Vision Changes Cardiovascular: No Chest Pain, no SOB,  No Palpitations Respiratory: No Cough, No Sputum, No Wheezing, no Dyspnea  Gastrointestinal: No Nausea, No Vomiting, No Diarrhea, No Constipation, No Pain Genitourinary: no Urinary  Incontinence, No Urgency, No Flank Pain Musculoskeletal: No Arthralgias, No Myalgias Skin: No Skin Lesions, No Pruritus, Neuro: no Weakness, No Numbness,  No Loss of Consciousness, No Syncope Psych: No Anxiety/Panic, No Depression, no decrease appetite Heme/Lymph: No Bruising, No Bleeding  Past Medical History:  Diagnosis Date  . Arthritis   . Atrial fibrillation ([place])   . Balance problem 10/31/2013  . BPH (benign prostatic hyperplasia) 2014   Urology Dr [name] with [facility] [phone]   . Bradycardia    a. baseline HR in the 30's. Asymptomatic - no history of PPM placement.   Marland Kitchen CAD (coronary artery disease)    LHC 03/16/11: Mid LAD 10-20%, proximal circumflex 10%, mid RCA 20%, EF 55%.  . Cataract   . Complete heart block ([place])   . Fracture of femoral neck, right ([place]) 04/19/2017  . GERD (gastroesophageal reflux disease)    takes occasional  zantac  . Hx of echocardiogram    Echo 02/2011: EF 65-70%, normal wall motion, MAC, mild MR, mild LAE, mild RVE  . Hypertension   . OSA (obstructive sleep apnea)    per patient went to have sleep study done about 2 years ago, bu they never F/U with him about results; but wife reports he still has periods of apnea at night   . Talar fracture    casted no surg    Past Surgical History:  Procedure Laterality Date  . ABDOMINAL AORTOGRAM W/LOWER EXTREMITY N/A 01/17/2017  Procedure: ABDOMINAL AORTOGRAM W/LOWER EXTREMITY;  Surgeon: Angelia Mould, MD;  Location: [facility];  Service: Cardiovascular;  Laterality: N/A;  . COLONOSCOPY     Repeated and normal in 2011  . FRACTURE SURGERY    . HEMIARTHROPLASTY HIP Right 04/19/2017  . HEMORROIDECTOMY    . HIP ARTHROPLASTY Right 04/19/2017   Procedure: ARTHROPLASTY BIPOLAR HIP (HEMIARTHROPLASTY);  Surgeon: Marchia Bond, MD;  Location: [facility];  Service: Orthopedics;  Laterality: Right;  . INGUINAL HERNIA REPAIR Right 02/26/2014   Procedure: LAPAROSCOPIC RIGHT INGUINAL HERNIA REPAIR;  Surgeon:  Ralene Ok, MD;  Location: [facility];  Service: General;  Laterality: Right;  . INGUINAL HERNIA REPAIR    . INSERTION OF MESH Right 02/26/2014   Procedure: INSERTION OF MESH;  Surgeon: Ralene Ok, MD;  Location: [facility];  Service: General;  Laterality: Right;  . LEFT HEART CATHETERIZATION WITH CORONARY ANGIOGRAM N/A 03/16/2011   Procedure: LEFT HEART CATHETERIZATION WITH CORONARY ANGIOGRAM;  Surgeon: Peter M Martinique, MD;  Location: [facility];  Service: Cardiovascular;  Laterality: N/A;  . PROSTATE BIOPSY     negative - [facility]  . TRANSURETHRAL RESECTION OF PROSTATE N/A 09/07/2016   Procedure: TRANSURETHRAL RESECTION OF THE PROSTATE (TURP);  Surgeon: Irine Seal, MD;  Location: [facility];  Service: Urology;  Laterality: N/A;     reports that he quit smoking about 63 years ago. His smoking use included cigarettes. He has a 3.00 pack-year smoking history. He has quit using smokeless tobacco. He reports current alcohol use of about 14.0 standard drinks of alcohol per week. He reports that he does not use drugs.  Allergies  Allergen Reactions  . Doxycycline Other (See Comments)    Upset stomach  . Keflex [Cephalexin] Other (See Comments)    Abdominal discomfort 04-19-17 Pt has tolerated orally    Family History  Problem Relation Age of Onset  . Hypertension Mother   . Multiple sclerosis Sister     Family history reviewed and not pertinent   Prior to Admission medications   Medication Sig Start Date End Date Taking? Authorizing Provider  acetaminophen (TYLENOL) 325 MG tablet Take 2 tablets (650 mg total) by mouth every 6 (six) hours as needed for moderate pain. Total acetaminophen intake not to exceed 4046m total from all sources per 24 hours. 04/22/17  Yes SAlphonzo Grieve MD  carbidopa-levodopa (SINEMET) 25-100 MG tablet Take 1 tablet by mouth 3 (three) times daily. 06/20/18  Yes Patel, Donika K, DO  docusate sodium (COLACE) 100 MG capsule Take 100 mg by mouth daily.   Yes [provider]  lisinopril-hydrochlorothiazide (PRINZIDE,ZESTORETIC) 10-12.5 MG tablet Take 1 tablet by mouth daily. 01/31/18  Yes HMinus Breeding MD  Misc Natural Products (PROSTATE) CAPS Take 1 tablet daily by mouth. Prostate Plus otc supplement   Yes [provider]  Multiple Vitamin (MULTIVITAMIN WITH MINERALS) TABS tablet Take 1 tablet daily by mouth.   Yes [provider]  Multiple Vitamins-Minerals (PRESERVISION AREDS 2+MULTI VIT PO) Take 1 capsule by mouth 2 (two) times daily.   Yes [provider]  rivaroxaban (XARELTO) 20 MG TABS tablet Take 1 tablet (20 mg total) by mouth daily with supper. 08/14/18  Yes HMinus Breeding MD  timolol (TIMOPTIC) 0.5 % ophthalmic solution One drop in both eyes in the morning and one drop in the left eye at night. Patient taking differently: Place 1 drop into both eyes 2 (two) times daily.  04/22/17  Yes SAlphonzo Grieve MD  vitamin C (ASCORBIC  ACID) 500 MG tablet Take 500 mg by mouth daily.   Yes [provider]    Physical Exam: Vitals:   12/14/18 1444 12/14/18 1707 12/14/18 1918  BP: 99/67 104/69 115/67  Pulse: (!) 44 (!) 45 (!) 45  Resp: _0 Temp: 97.9 F (36.6 C)    TempSrc: Oral Oral Oral  SpO2: 99% 98% 95%    Constitutional: NAD, calm, comfortable, elderly male laying flat in bed Vitals:   12/14/18 1444 12/14/18 1707 12/14/18 1918  BP: 99/67 104/69 115/67  Pulse: (!) 44 (!) 45 (!) 45  Resp: _1 Temp: 97.9 F (36.6 C)    TempSrc: Oral Oral Oral  SpO2: 99% 98% 95%   Eyes: PERRL, lids and conjunctivae normal ENMT: Mucous membranes are moist. Neck: normal, supple, no masses Respiratory: clear to auscultation bilaterally, no wheezing, no crackles. Normal respiratory effort on room air. No accessory muscle use.  Cardiovascular: Regular rate and rhythm, no murmurs / rubs / gallops. No extremity edema. 2+ pedal pulses.  Abdomen: tenderness to left lower rib cage, no masses palpated.  Bowel  sounds positive.  Musculoskeletal: no clubbing / cyanosis. No joint deformity upper and lower extremities. .no contractures. Normal muscle tone.  Unable to move left lower extremity due to intolerance for pain. Skin: no rashes, lesions, ulcers. No induration Neurologic: CN 2-12 grossly intact. Sensation intact. Strength 4/5 in right lower extremity.  Unable to move left lower extremity due to pain. Psychiatric: Normal judgment and insight. Alert and oriented x 3. Normal mood.    Labs on Admission: I have personally reviewed following labs and imaging studies  CBC: Recent Labs  Lab 12/14/18 1514  WBC 16.8*  NEUTROABS 15.1*  HGB 13.9  HCT 43.6  MCV 100.5*  PLT 166   Basic Metabolic Panel: Recent Labs  Lab 12/14/18 1514  NA 139  K 4.4  CL 105  CO2 21*  GLUCOSE 152*  BUN 42*  CREATININE 1.42*  CALCIUM 9.5   GFR: CrCl cannot be calculated (Unknown ideal weight.). Liver Function Tests: No results for input(s): AST, ALT, ALKPHOS, BILITOT, PROT, ALBUMIN in the last 168 hours. No results for input(s): LIPASE, AMYLASE in the last 168 hours. No results for input(s): AMMONIA in the last 168 hours. Coagulation Profile: Recent Labs  Lab 12/14/18 1656  INR 1.9*   Cardiac Enzymes: No results for input(s): CKTOTAL, CKMB, CKMBINDEX, TROPONINI in the last 168 hours. BNP (last 3 results) No results for input(s): PROBNP in the last 8760 hours. HbA1C: No results for input(s): HGBA1C in the last 72 hours. CBG: No results for input(s): GLUCAP in the last 168 hours. Lipid Profile: No results for input(s): CHOL, HDL, LDLCALC, TRIG, CHOLHDL, LDLDIRECT in the last 72 hours. Thyroid Function Tests: No results for input(s): TSH, T4TOTAL, FREET4, T3FREE, THYROIDAB in the last 72 hours. Anemia Panel: No results for input(s): VITAMINB12, FOLATE, FERRITIN, TIBC, IRON, RETICCTPCT in the last 72 hours. Urine analysis:    Component Value Date/Time   COLORURINE YELLOW 04/19/2017 [place] 04/19/2017 1737   LABSPEC 1.025 04/19/2017 1737   PHURINE 5.0 04/19/2017 1737   GLUCOSEU NEGATIVE 04/19/2017 1737   HGBUR NEGATIVE 04/19/2017 1737   BILIRUBINUR negative 04/01/2018 1148   KETONESUR negative 03/28/2018 1116   KETONESUR 20 (A) 04/19/2017 1737   PROTEINUR Positive (A) 04/01/2018 1148   PROTEINUR NEGATIVE 04/19/2017 1737   UROBILINOGEN 0.2 04/01/2018 1148   UROBILINOGEN 1.0 04/27/2012 0019   NITRITE negative  04/01/2018 1148   NITRITE NEGATIVE 04/19/2017 1737   LEUKOCYTESUR Trace (A) 04/01/2018 1148    Radiological Exams on Admission: Dg Ribs Unilateral W/chest Left  Result Date: 12/14/2018 CLINICAL DATA:  Left lower rib pain after fall EXAM: LEFT RIBS AND CHEST - 3+ VIEW COMPARISON:  Chest x-ray 04/19/2017 FINDINGS: No fracture or other bone lesions are seen involving the ribs. There is no evidence of pneumothorax or pleural effusion. Bilateral pleural plaques again noted. Stable cardiomegaly. IMPRESSION: No left-sided rib fracture identified. Electronically Signed   By: Davina Poke M.D.   On: 12/14/2018 16:24   Ct Head Wo Contrast  Result Date: 12/14/2018 CLINICAL DATA:  Per ed notes: Pt assisted out of car onto stretcher, here with family who sts he fell last night, injuring L hip. Pt unable to bear weight. Pt taking Xarelto. EXAM: CT HEAD WITHOUT CONTRAST TECHNIQUE: Contiguous axial images were obtained from the base of the skull through the vertex without intravenous contrast. COMPARISON:  None. FINDINGS: Brain: There is central and cortical atrophy. Periventricular white matter changes are consistent with small vessel disease. No evidence of acute infarction, hemorrhage, hydrocephalus, extra-axial collection or mass lesion/mass effect. Vascular: No hyperdense vessel or unexpected calcification. Skull: Normal. Negative for fracture or focal lesion. Sinuses/Orbits: No acute finding. Other: None. IMPRESSION: 1. No evidence for acute intracranial  abnormality. 2. Atrophy and small vessel disease. Electronically Signed   By: Nolon Nations M.D.   On: 12/14/2018 18:16   Dg Hip Unilat W Or Wo Pelvis 2-3 Views Left  Result Date: 12/14/2018 CLINICAL DATA:  Pt fell last night and experiencing LEFT-sided pain. Pt c/o lower rib pain radiating, L hip pain and L leg pain. EXAM: DG HIP (WITH OR WITHOUT PELVIS) 2-3V LEFT COMPARISON:  01/12/2012 FINDINGS: There is an acute impacted fracture of the LEFT femoral neck, associated with varus angulation. Interval RIGHT hip arthroplasty. Degenerative changes are seen in the LOWER lumbar spine. IMPRESSION: Acute fracture of the LEFT femoral neck. Electronically Signed   By: Nolon Nations M.D.   On: 12/14/2018 16:22   Dg Femur Min 2 Views Left  Result Date: 12/14/2018 CLINICAL DATA:  Pt fell last night and experiencing L sided pain. Pt c/o lower rib pain radiating, L hip pain and L leg pain. EXAM: LEFT FEMUR 2 VIEWS COMPARISON:  01/12/2012 FINDINGS: There is an acute impacted fracture of the LEFT femoral neck, associated with varus angulation. There is dense atherosclerotic calcification of the femoral artery. IMPRESSION: Acute fracture of the LEFT femoral neck. Electronically Signed   By: Nolon Nations M.D.   On: 12/14/2018 16:21    EKG: Independently reviewed.   Assessment/Plan   Left femoral neck fracture s/p mechanical fall -Ortho consulted and will do operative treatment possibly tomorrow in the morning -As needed 5-316m hydrocodone q6hr and morphine 0.566mq2hr for pain  AKI Likely prerenal. Received 1 L of normal saline fluid resuscitation Follow BMP Avoid nephrotoxic agent Hold lisinopril-HCTZ  Hypertension Hold lisinopril HCTZ due to AKI  History of atrial fibrillation -Rate controlled - CHA2D2Vac score of 3 - hold Xarelto for surgery.  -Last Xarelto dose on 10/21 9 PM  Asymptomatic bradycardia in the setting complete heart block -Stable -No pacemaker indication.  Followed  by cardiology outpatient  Parkinson's disease Continue carbidopa levodopa  Left ear lesion -Wife reports he had basal cell skin carcinoma removed outpatient recently -Wound care per RN     DVT prophylaxis:SCDs.  Holding Xarelto for potential operation the morning. Code Status:Full Family  Communication: Plan discussed with patient and wife at bedside  disposition Plan: Home with at least 2 midnight stays  Consults called: Ortho Admission status: inpatient   Karrissa Parchment T Debbrah Sampedro DO [facility]  If 7PM-7AM, please contact night-coverage [email or web address] Password [facility]  12/14/2018, 7:58 PM

## 2018-12-14 NOTE — ED Triage Notes (Addendum)
Pt assisted out of car onto stretcher, here with family who sts he fell last night, injuring L hip. Pt unable to bear weight. Pt taking Xarelto. Per family, they called EMS this morning who loaded him into their minivan to come to the ER.

## 2018-12-14 NOTE — ED Provider Notes (Addendum)
[facility] EMERGENCY DEPARTMENT Provider Note   CSN: [id number] Arrival date & time: 12/14/18  1421     History   Chief Complaint No chief complaint on file.   HPI Chad Russell is a 85 y.o. male.  Presents in recovery after fall last night.  He got up around 2 AM, tried to walk with a cane when he normally uses a walker and had a fall.  Patient denies any loss of consciousness.  States he landed on his left side, striking his left hip.  Also endorses trauma to head.  Denies pain anywhere else at this time.  States pain in his left hip, left leg is sharp, stabbing, currently mild in severity, worsened with any sort of movement.  Denies any chest pain, difficulty breathing.  No neck or back pain.  Patient takes Xarelto, prior history of A. fib, coronary artery disease, heart block, hypertension.     HPI  Past Medical History:  Diagnosis Date  . Arthritis   . Atrial fibrillation ([place])   . Balance problem 10/31/2013  . BPH (benign prostatic hyperplasia) 2014   Urology Dr [name] with [facility] [phone]   . Bradycardia    a. baseline HR in the 30's. Asymptomatic - no history of PPM placement.   Marland Kitchen CAD (coronary artery disease)    LHC 03/16/11: Mid LAD 10-20%, proximal circumflex 10%, mid RCA 20%, EF 55%.  . Cataract   . Complete heart block ([place])   . Fracture of femoral neck, right ([place]) 04/19/2017  . GERD (gastroesophageal reflux disease)    takes occasional  zantac  . Hx of echocardiogram    Echo 02/2011: EF 65-70%, normal wall motion, MAC, mild MR, mild LAE, mild RVE  . Hypertension   . OSA (obstructive sleep apnea)    per patient went to have sleep study done about 2 years ago, bu they never F/U with him about results; but wife reports he still has periods of apnea at night   . Talar fracture    casted no surg    Patient Active Problem List   Diagnosis Date Noted  . Chest pain 05/04/2018  . Fracture of femoral neck, right ([place]) 04/19/2017  . MGUS  (monoclonal gammopathy of unknown significance) 01/15/2017  . BPH with obstruction/lower urinary tract symptoms 09/07/2016  . Preoperative cardiovascular examination 08/24/2016  . Urinary retention 05/13/2016  . Balance problem 10/31/2013  . OSA (obstructive sleep apnea) 10/31/2013  . BPH (benign prostatic hyperplasia) 08/28/2012  . Atrial fibrillation ([place]) 06/08/2012  . Hyperplasia of prostate with lower urinary tract symptoms (LUTS) 04/28/2012  . Moderate malnutrition ([place]) 04/28/2012  . Infection of urinary tract - recurrent 04/26/2012  . NSTEMI (non-ST elevated myocardial infarction) ([place]) 03/15/2011  . Complete heart block ([place]) 03/15/2011  . HTN (hypertension) 03/15/2011    Past Surgical History:  Procedure Laterality Date  . ABDOMINAL AORTOGRAM W/LOWER EXTREMITY N/A 01/17/2017   Procedure: ABDOMINAL AORTOGRAM W/LOWER EXTREMITY;  Surgeon: Angelia Mould, MD;  Location: [facility];  Service: Cardiovascular;  Laterality: N/A;  . COLONOSCOPY     Repeated and normal in 2011  . FRACTURE SURGERY    . HEMIARTHROPLASTY HIP Right 04/19/2017  . HEMORROIDECTOMY    . HIP ARTHROPLASTY Right 04/19/2017   Procedure: ARTHROPLASTY BIPOLAR HIP (HEMIARTHROPLASTY);  Surgeon: Marchia Bond, MD;  Location: [facility];  Service: Orthopedics;  Laterality: Right;  . INGUINAL HERNIA REPAIR Right 02/26/2014   Procedure: LAPAROSCOPIC RIGHT INGUINAL HERNIA REPAIR;  Surgeon:  Ralene Ok, MD;  Location: [facility];  Service: General;  Laterality: Right;  . INGUINAL HERNIA REPAIR    . INSERTION OF MESH Right 02/26/2014   Procedure: INSERTION OF MESH;  Surgeon: Ralene Ok, MD;  Location: [facility];  Service: General;  Laterality: Right;  . LEFT HEART CATHETERIZATION WITH CORONARY ANGIOGRAM N/A 03/16/2011   Procedure: LEFT HEART CATHETERIZATION WITH CORONARY ANGIOGRAM;  Surgeon: Peter M Martinique, MD;  Location: [facility] CATH LAB;  Service: Cardiovascular;  Laterality: N/A;  . PROSTATE BIOPSY     negative -  [facility]  . TRANSURETHRAL RESECTION OF PROSTATE N/A 09/07/2016   Procedure: TRANSURETHRAL RESECTION OF THE PROSTATE (TURP);  Surgeon: Irine Seal, MD;  Location: [facility];  Service: Urology;  Laterality: N/A;        Home Medications    Prior to Admission medications   Medication Sig Start Date End Date Taking? Authorizing Provider  acetaminophen (TYLENOL) 325 MG tablet Take 2 tablets (650 mg total) by mouth every 6 (six) hours as needed for moderate pain. Total acetaminophen intake not to exceed 4075m total from all sources per 24 hours. 04/22/17   SAlphonzo Grieve MD  bisacodyl (DULCOLAX) 5 MG EC tablet Take 1 tablet (5 mg total) by mouth daily as needed for moderate constipation. 04/19/17   LMarchia Bond MD  carbidopa-levodopa (SINEMET) 25-100 MG tablet Take 1 tablet by mouth 3 (three) times daily. 06/20/18   PNarda AmberK, DO  clotrimazole (LOTRIMIN) 1 % cream Apply 1 application topically 2 (two) times daily. 10/27/17   GWendie Agreste MD  hydrocortisone cream 1 % Apply 1 application topically daily as needed for itching. 04/22/17   SAlphonzo Grieve MD  lisinopril-hydrochlorothiazide (PRINZIDE,ZESTORETIC) 10-12.5 MG tablet Take 1 tablet by mouth daily. 01/31/18   HMinus Breeding MD  LUTEIN-ZEAXANTHIN PO Take 1 tablet daily by mouth.     [provider]  magnesium citrate solution Take 148 mLs by mouth daily. 04/22/17   SAlphonzo Grieve MD  Misc Natural Products (PROSTATE) CAPS Take 1 tablet daily by mouth. Prostate Plus otc supplement    [provider]  Multiple Vitamin (MULTIVITAMIN WITH MINERALS) TABS tablet Take 1 tablet daily by mouth.    [provider]  Probiotic Product (PROBIOTIC DAILY PO) Take 1 tablet by mouth daily.    [provider]  rivaroxaban (XARELTO) 20 MG TABS tablet Take 1 tablet (20 mg total) by mouth daily with supper. 08/14/18   HMinus Breeding MD  timolol (TIMOPTIC) 0.5 % ophthalmic solution One drop in both eyes in the  morning and one drop in the left eye at night. Patient taking differently: Place 2 drops into both eyes. One drop in both eyes in the morning and one drop in the left eye at night. 04/22/17   SAlphonzo Grieve MD  vitamin C (ASCORBIC ACID) 500 MG tablet Take 500 mg by mouth daily.    [provider]    Family History Family History  Problem Relation Age of Onset  . Hypertension Mother   . Multiple sclerosis Sister     Social History Social History   Tobacco Use  . Smoking status: Former Smoker    Packs/day: 1.00    Years: 3.00    Pack years: 3.00    Types: Cigarettes    Quit date: 03/17/1955    Years since quitting: 63.7  . Smokeless tobacco: Former UNetwork engineerUse Topics  . Alcohol use: Yes    Alcohol/week: 14.0 standard drinks  Types: 7 Cans of beer, 7 Standard drinks or equivalent per week    Comment: daily  . Drug use: No     Allergies   Doxycycline and Keflex [cephalexin]   Review of Systems Review of Systems  Constitutional: Negative for chills and fever.  HENT: Negative for ear pain and sore throat.   Eyes: Negative for pain and visual disturbance.  Respiratory: Negative for cough and shortness of breath.   Cardiovascular: Negative for chest pain and palpitations.  Gastrointestinal: Negative for abdominal pain and vomiting.  Genitourinary: Negative for dysuria and hematuria.  Musculoskeletal: Positive for arthralgias. Negative for back pain.  Skin: Negative for color change and rash.  Neurological: Negative for seizures and syncope.  All other systems reviewed and are negative.    Physical Exam Updated Vital Signs BP 99/67 (BP Location: Right Arm)   Pulse (!) 44   Temp 97.9 F (36.6 C) (Oral)   Resp 14   SpO2 99%   Physical Exam Vitals signs and nursing note reviewed.  Constitutional:      Appearance: He is well-developed.  HENT:     Head: Normocephalic.     Comments: 2 cm hematoma over posterior occiput, no significant laceration  noted, no active bleeding Eyes:     Conjunctiva/sclera: Conjunctivae normal.  Neck:     Musculoskeletal: Neck supple.  Cardiovascular:     Rate and Rhythm: Normal rate and regular rhythm.     Heart sounds: No murmur.  Pulmonary:     Effort: Pulmonary effort is normal. No respiratory distress.     Breath sounds: Normal breath sounds.  Abdominal:     Palpations: Abdomen is soft.     Tenderness: There is no abdominal tenderness.  Musculoskeletal:     Comments: Back: no C, T, L spine TTP LUE: no TTP throughout extremity, no deformity RUE: no TTP throughout extremity, no deformity RLE: no TTP throughout extremity, no deformity LLE: TTP in mid thigh, no obvious deformity, distal sensation, pulses intact  Skin:    General: Skin is warm and dry.     Capillary Refill: Capillary refill takes less than 2 seconds.  Neurological:     General: No focal deficit present.     Mental Status: He is alert and oriented to person, place, and time.  Psychiatric:        Mood and Affect: Mood normal.        Behavior: Behavior normal.      ED Treatments / Results  Labs (all labs ordered are listed, but only abnormal results are displayed) Labs Reviewed  CBC WITH DIFFERENTIAL/PLATELET - Abnormal; Notable for the following components:      Result Value   WBC 16.8 (*)    MCV 100.5 (*)    Neutro Abs 15.1 (*)    All other components within normal limits  BASIC METABOLIC PANEL - Abnormal; Notable for the following components:   CO2 21 (*)    Glucose, Bld 152 (*)    BUN 42 (*)    Creatinine, Ser 1.42 (*)    GFR calc non Af Amer 43 (*)    GFR calc Af Amer 50 (*)    All other components within normal limits    EKG EKG Interpretation  Date/Time:  Thursday December 14 2018 15:05:08 EDT Ventricular Rate:  44 PR Interval:    QRS Duration: 163 QT Interval:  637 QTC Calculation: 545 R Axis:   86 Text Interpretation:  A-flutter/fibrillation  Right bundle branch block No  discernable P waves No  STEMI  Confirmed by Octaviano Glow [phone]) on 12/14/2018 3:17:19 PM   Radiology No results found.  Procedures Procedures (including critical care time)  Medications Ordered in ED Medications  acetaminophen (TYLENOL) tablet 500 mg (has no administration in time range)     Initial Impression / Assessment and Plan / ED Course  I have reviewed the triage vital signs and the nursing notes.  Pertinent labs & imaging results that were available during my care of the patient were reviewed by me and considered in my medical decision making (see chart for details).  Clinical Course as of Dec 14 1546  Thu Dec 14, 2018  1504 Pt signed out to me.  Briefly 85 yo male on xarelto for a fib with mechanicl fall from ground level complaining of left hip pain.  Pending CTH and xray of hip and femur for fx eval   [MT]  1538 Spoke to patient about complete heart block - apparently he has had this diagnosis for 40 years, and has discussed and passed on a pacemaker many years ago.  Never had syncope.  No syncope last night.  His wife confirms it was a mechanical fall from standing height.     [MT]    Clinical Course User Index [MT] Wyvonnia Dusky, MD       85 year old male presents to ER after mechanical fall.  On exam concern for hematoma of head, tenderness over left femur, left anterior chest wall.  Will obtain CT head imaging given head trauma, age and on Xarelto.  Will obtain plain films of chest, left hip and left femur.  Will check screening labs.  Also noted bradycardia on triage vitals, will check EKG.  While awaiting CTs, x-ray, EKG, patient signed out to Dr. Langston Masker.  Please refer to his note for final plan disposition pending this work-up.  Final Clinical Impressions(s) / ED Diagnoses   Final diagnoses:  Hematoma of scalp, initial encounter  Pain of left lower extremity    ED Discharge Orders    None       Lucrezia Starch, MD 12/14/18 1502    Lucrezia Starch, MD  12/14/18 [phone]

## 2018-12-14 NOTE — Telephone Encounter (Signed)
Copied from [place] [phone]. Topic: General - Other >> Dec 14, 2018  9:39 AM Yvette Rack wrote: Reason for CRM: Pt wife Mardene Celeste stated pt fell and possibly fractured his hip. Pt wife stated she will be calling EMS later this afternoon to come get pt because he can not get out of bed. Pt wife stated if possible she would like Dr. Carlota Raspberry to admit pt in to the hospital instead of just having him go to the ED.

## 2018-12-14 NOTE — Consult Note (Signed)
Reason for Consult:L hip fracture Referring Physician: [facility] ED physician  Chad Russell is an 85 y.o. male.  HPI: Chad Russell is a 85 y.o. male.  Presents in recovery after fall last night.  He got up around 2 AM, tried to walk with a cane when he normally uses a walker and had a fall.  Patient denies any loss of consciousness.  States he landed on his left side, striking his left hip.  Also endorses trauma to head.  Denies pain anywhere else at this time.  States pain in his left hip, left leg is sharp, stabbing, currently mild in severity, worsened with any sort of movement.  Denies any chest pain, difficulty breathing.  No neck or back pain.  Patient was taken to [facility] ED via family members in [place], unable to weight bear, was assisted into minivan apparently by EMS.  Xrays were obtained in ED showing L impacted femoral neck fracture.  Stable R total hip previously done by Dr. Mardelle Matte.  Patient takes Xarelto, prior history of A. fib, coronary artery disease, heart block, hypertension.  Past Medical History:  Diagnosis Date  . Arthritis   . Atrial fibrillation ([place])   . Balance problem 10/31/2013  . BPH (benign prostatic hyperplasia) 2014   Urology Dr [name] with [facility] [phone]   . Bradycardia    a. baseline HR in the 30's. Asymptomatic - no history of PPM placement.   Marland Kitchen CAD (coronary artery disease)    LHC 03/16/11: Mid LAD 10-20%, proximal circumflex 10%, mid RCA 20%, EF 55%.  . Cataract   . Complete heart block ([place])   . Fracture of femoral neck, right ([place]) 04/19/2017  . GERD (gastroesophageal reflux disease)    takes occasional  zantac  . Hx of echocardiogram    Echo 02/2011: EF 65-70%, normal wall motion, MAC, mild MR, mild LAE, mild RVE  . Hypertension   . OSA (obstructive sleep apnea)    per patient went to have sleep study done about 2 years ago, bu they never F/U with him about results; but wife reports he still has periods of apnea at night   . Talar fracture    casted  no surg    Past Surgical History:  Procedure Laterality Date  . ABDOMINAL AORTOGRAM W/LOWER EXTREMITY N/A 01/17/2017   Procedure: ABDOMINAL AORTOGRAM W/LOWER EXTREMITY;  Surgeon: Angelia Mould, MD;  Location: [facility];  Service: Cardiovascular;  Laterality: N/A;  . COLONOSCOPY     Repeated and normal in 2011  . FRACTURE SURGERY    . HEMIARTHROPLASTY HIP Right 04/19/2017  . HEMORROIDECTOMY    . HIP ARTHROPLASTY Right 04/19/2017   Procedure: ARTHROPLASTY BIPOLAR HIP (HEMIARTHROPLASTY);  Surgeon: Marchia Bond, MD;  Location: [facility];  Service: Orthopedics;  Laterality: Right;  . INGUINAL HERNIA REPAIR Right 02/26/2014   Procedure: LAPAROSCOPIC RIGHT INGUINAL HERNIA REPAIR;  Surgeon: Ralene Ok, MD;  Location: [facility];  Service: General;  Laterality: Right;  . INGUINAL HERNIA REPAIR    . INSERTION OF MESH Right 02/26/2014   Procedure: INSERTION OF MESH;  Surgeon: Ralene Ok, MD;  Location: [facility];  Service: General;  Laterality: Right;  . LEFT HEART CATHETERIZATION WITH CORONARY ANGIOGRAM N/A 03/16/2011   Procedure: LEFT HEART CATHETERIZATION WITH CORONARY ANGIOGRAM;  Surgeon: Peter M Martinique, MD;  Location: [facility] CATH LAB;  Service: Cardiovascular;  Laterality: N/A;  . PROSTATE BIOPSY     negative - [facility]  . TRANSURETHRAL RESECTION OF PROSTATE N/A  09/07/2016   Procedure: TRANSURETHRAL RESECTION OF THE PROSTATE (TURP);  Surgeon: Irine Seal, MD;  [facility];  Service: Urology;  Laterality: N/A;    Family History  Problem Relation Age of Onset  . Hypertension Mother   . Multiple sclerosis Sister     Social History:  reports that he quit smoking about 63 years ago. His smoking use included cigarettes. He has a 3.00 pack-year smoking history. He has quit using smokeless tobacco. He reports current alcohol use of about 14.0 standard drinks of alcohol per week. He reports that he does not use drugs.  Allergies:  Allergies  Allergen Reactions  . Doxycycline  Other (See Comments)    Upset stomach  . Keflex [Cephalexin] Other (See Comments)    Abdominal discomfort 04-19-17 Pt has tolerated orally    Medications: I have reviewed the patient's current medications.  Results for orders placed or performed during the hospital encounter of 12/14/18 (from the past 48 hour(s))  CBC with Differential     Status: Abnormal   Collection Time: 12/14/18  3:14 PM  Result Value Ref Range   WBC 16.8 (H) 4.0 - 10.5 K/uL   RBC 4.34 4.22 - 5.81 MIL/uL   Hemoglobin 13.9 13.0 - 17.0 g/dL   HCT 43.6 39.0 - 52.0 %   MCV 100.5 (H) 80.0 - 100.0 fL   MCH 32.0 26.0 - 34.0 pg   MCHC 31.9 30.0 - 36.0 g/dL   RDW 14.1 11.5 - 15.5 %   Platelets 203 150 - 400 K/uL   nRBC 0.0 0.0 - 0.2 %   Neutrophils Relative % 90 %   Neutro Abs 15.1 (H) 1.7 - 7.7 K/uL   Lymphocytes Relative 5 %   Lymphs Abs 0.8 0.7 - 4.0 K/uL   Monocytes Relative 5 %   Monocytes Absolute 0.8 0.1 - 1.0 K/uL   Eosinophils Relative 0 %   Eosinophils Absolute 0.0 0.0 - 0.5 K/uL   Basophils Relative 0 %   Basophils Absolute 0.0 0.0 - 0.1 K/uL   Immature Granulocytes 0 %   Abs Immature Granulocytes 0.07 0.00 - 0.07 K/uL    Comment: Performed at [facility], [address] [place], [place] [address]  Basic metabolic panel     Status: Abnormal   Collection Time: 12/14/18  3:14 PM  Result Value Ref Range   Sodium 139 135 - 145 mmol/L   Potassium 4.4 3.5 - 5.1 mmol/L   Chloride 105 98 - 111 mmol/L   CO2 21 (L) 22 - 32 mmol/L   Glucose, Bld 152 (H) 70 - 99 mg/dL   BUN 42 (H) 8 - 23 mg/dL   Creatinine, Ser 1.42 (H) 0.61 - 1.24 mg/dL   Calcium 9.5 8.9 - 10.3 mg/dL   GFR calc non Af Amer 43 (L) >60 mL/min   GFR calc Af Amer 50 (L) >60 mL/min   Anion gap 13 5 - 15    Comment: Performed at [facility] [address], [place], [place] [address]  Protime-INR     Status: Abnormal   Collection Time: 12/14/18  4:56 PM  Result Value Ref Range   Prothrombin Time 21.8 (H) 11.4 - 15.2 seconds    INR 1.9 (H) 0.8 - 1.2    Comment: (NOTE) INR goal varies based on device and disease states. Performed at [facility], [place] [address] [place], [place] [address]   APTT     Status: None   Collection  Time: 12/14/18  4:56 PM  Result Value Ref Range   aPTT 33 24 - 36 seconds    Comment: Performed at [facility] [address], [place], [place] [address]    Dg Ribs Unilateral W/chest Left  Result Date: 12/14/2018 CLINICAL DATA:  Left lower rib pain after fall EXAM: LEFT RIBS AND CHEST - 3+ VIEW COMPARISON:  Chest x-ray 04/19/2017 FINDINGS: No fracture or other bone lesions are seen involving the ribs. There is no evidence of pneumothorax or pleural effusion. Bilateral pleural plaques again noted. Stable cardiomegaly. IMPRESSION: No left-sided rib fracture identified. Electronically Signed   By: Davina Poke M.D.   On: 12/14/2018 16:24   Ct Head Wo Contrast  Result Date: 12/14/2018 CLINICAL DATA:  Per ed notes: Pt assisted out of car onto stretcher, here with family who sts he fell last night, injuring L hip. Pt unable to bear weight. Pt taking Xarelto. EXAM: CT HEAD WITHOUT CONTRAST TECHNIQUE: Contiguous axial images were obtained from the base of the skull through the vertex without intravenous contrast. COMPARISON:  None. FINDINGS: Brain: There is central and cortical atrophy. Periventricular white matter changes are consistent with small vessel disease. No evidence of acute infarction, hemorrhage, hydrocephalus, extra-axial collection or mass lesion/mass effect. Vascular: No hyperdense vessel or unexpected calcification. Skull: Normal. Negative for fracture or focal lesion. Sinuses/Orbits: No acute finding. Other: None. IMPRESSION: 1. No evidence for acute intracranial abnormality. 2. Atrophy and small vessel disease. Electronically Signed   By: Nolon Nations M.D.   On: 12/14/2018 18:16   Dg Hip Unilat W Or Wo Pelvis 2-3 Views Left  Result Date: 12/14/2018 CLINICAL  DATA:  Pt fell last night and experiencing LEFT-sided pain. Pt c/o lower rib pain radiating, L hip pain and L leg pain. EXAM: DG HIP (WITH OR WITHOUT PELVIS) 2-3V LEFT COMPARISON:  01/12/2012 FINDINGS: There is an acute impacted fracture of the LEFT femoral neck, associated with varus angulation. Interval RIGHT hip arthroplasty. Degenerative changes are seen in the LOWER lumbar spine. IMPRESSION: Acute fracture of the LEFT femoral neck. Electronically Signed   By: Nolon Nations M.D.   On: 12/14/2018 16:22   Dg Femur Min 2 Views Left  Result Date: 12/14/2018 CLINICAL DATA:  Pt fell last night and experiencing L sided pain. Pt c/o lower rib pain radiating, L hip pain and L leg pain. EXAM: LEFT FEMUR 2 VIEWS COMPARISON:  01/12/2012 FINDINGS: There is an acute impacted fracture of the LEFT femoral neck, associated with varus angulation. There is dense atherosclerotic calcification of the femoral artery. IMPRESSION: Acute fracture of the LEFT femoral neck. Electronically Signed   By: Nolon Nations M.D.   On: 12/14/2018 16:21    Review of Systems  Musculoskeletal: Positive for falls and joint pain.  All other systems reviewed and are negative.  Blood pressure 115/67, pulse (!) 45, temperature 97.9 F (36.6 C), temperature source Oral, resp. rate 19, SpO2 95 %. Physical Exam  Assessment/Plan: Impacted L femoral neck fracture from acute fall also with multiple medical comorbidities   Patient to be admitted to medicine service with hip fracture protocol and optimized for surgical fixation.  Pending medical clearance could be done as early as 12/15/18.  Patient should be bed rest NWB LLE, pain control as needed.  NPO after midnight for the possibility of surgery tomorrow.      Lockie Pares Jr 12/14/2018, 7:56 PM

## 2018-12-15 ENCOUNTER — Encounter (HOSPITAL_COMMUNITY): Payer: Self-pay | Admitting: *Deleted

## 2018-12-15 DIAGNOSIS — N179 Acute kidney failure, unspecified: Secondary | ICD-10-CM

## 2018-12-15 DIAGNOSIS — I442 Atrioventricular block, complete: Secondary | ICD-10-CM

## 2018-12-15 DIAGNOSIS — F0280 Dementia in other diseases classified elsewhere without behavioral disturbance: Secondary | ICD-10-CM

## 2018-12-15 DIAGNOSIS — G20 Parkinson's disease: Secondary | ICD-10-CM

## 2018-12-15 DIAGNOSIS — S72002D Fracture of unspecified part of neck of left femur, subsequent encounter for closed fracture with routine healing: Secondary | ICD-10-CM

## 2018-12-15 LAB — SURGICAL PCR SCREEN
MRSA, PCR: NEGATIVE
Staphylococcus aureus: NEGATIVE

## 2018-12-15 LAB — SARS CORONAVIRUS 2 (TAT 6-24 HRS): SARS Coronavirus 2: NEGATIVE

## 2018-12-15 MED ORDER — MUPIROCIN 2 % EX OINT
1.0000 "application " | TOPICAL_OINTMENT | Freq: Two times a day (BID) | CUTANEOUS | Status: AC
  Administered 2018-12-15 – 2018-12-18 (×7): 1 via NASAL
  Filled 2018-12-15 (×2): qty 22

## 2018-12-15 MED ORDER — PNEUMOCOCCAL VAC POLYVALENT 25 MCG/0.5ML IJ INJ
0.5000 mL | INJECTION | INTRAMUSCULAR | Status: AC
  Administered 2018-12-21: 0.5 mL via INTRAMUSCULAR
  Filled 2018-12-15: qty 0.5

## 2018-12-15 NOTE — Plan of Care (Signed)
  Problem: Clinical Measurements: Goal: Cardiovascular complication will be avoided Outcome: Progressing   Problem: Safety: Goal: Ability to remain free from injury will improve Outcome: Progressing   Problem: Pain Managment: Goal: General experience of comfort will improve Outcome: Progressing   Problem: Skin Integrity: Goal: Risk for impaired skin integrity will decrease Outcome: Progressing   

## 2018-12-15 NOTE — Consult Note (Signed)
Orthopaedic Trauma Service (OTS) Consult   Patient ID: Chad Russell MRN: [id number] DOB/AGE: [birth year] 85 y.o.   Reason for Consult: left femoral neck fracture  Referring Physician: Flossie Dibble, MD (ortho)   HPI: Chad Russell is an 85 y.o. male with complex medical history , history of R femoral neck fracture s/p hemiarthroplasty who sustained a fall early am on 12/14/2018. Pt who normally uses a walker was using his cane to go to the kitchen when he fell landing on his left hip. Pt was brought to the hospital and found to have a Left femoral neck fracture. Family requested the Sports Medicine Call group as previous R hip hemi was done by Dr. Mardelle Matte. Pt was seen by Dr. French Ana last night. Due to scheduling issues the Orthopaedic Trauma Service was consulted to expedite care.   Pt seen this evening by OTS. Wife at bedside  No acute issues  On chronic xarelto for afib. Has not had since night of 10/21 History of nonhealing ulcer left foot (2018), aortogram completed and ulcer felt to be due to venous insufficiency   Walks with walker at baseline Has had some difficulty with R hip over the last few months Lives in a house with wife. She is the only one at home to help him     Past Medical History:  Diagnosis Date  . Arthritis   . Atrial fibrillation ([place])   . Balance problem 10/31/2013  . BPH (benign prostatic hyperplasia) 2014   Urology Dr [name] with [facility] [phone]   . Bradycardia    a. baseline HR in the 30's. Asymptomatic - no history of PPM placement.   Marland Kitchen CAD (coronary artery disease)    LHC 03/16/11: Mid LAD 10-20%, proximal circumflex 10%, mid RCA 20%, EF 55%.  . Cataract   . Complete heart block ([place])   . Fracture of femoral neck, right ([place]) 04/19/2017  . GERD (gastroesophageal reflux disease)    takes occasional  zantac  . Hx of echocardiogram    Echo 02/2011: EF 65-70%, normal wall motion, MAC, mild MR, mild LAE, mild RVE  . Hypertension   .  OSA (obstructive sleep apnea)    per patient went to have sleep study done about 2 years ago, bu they never F/U with him about results; but wife reports he still has periods of apnea at night   . Talar fracture    casted no surg    Past Surgical History:  Procedure Laterality Date  . ABDOMINAL AORTOGRAM W/LOWER EXTREMITY N/A 01/17/2017   Procedure: ABDOMINAL AORTOGRAM W/LOWER EXTREMITY;  Surgeon: Angelia Mould, MD;  Location: [facility];  Service: Cardiovascular;  Laterality: N/A;  . COLONOSCOPY     Repeated and normal in 2011  . FRACTURE SURGERY    . HEMIARTHROPLASTY HIP Right 04/19/2017  . HEMORROIDECTOMY    . HIP ARTHROPLASTY Right 04/19/2017   Procedure: ARTHROPLASTY BIPOLAR HIP (HEMIARTHROPLASTY);  Surgeon: Marchia Bond, MD;  Location: [facility];  Service: Orthopedics;  Laterality: Right;  . INGUINAL HERNIA REPAIR Right 02/26/2014   Procedure: LAPAROSCOPIC RIGHT INGUINAL HERNIA REPAIR;  Surgeon: Ralene Ok, MD;  Location: [facility];  Service: General;  Laterality: Right;  . INGUINAL HERNIA REPAIR    . INSERTION OF MESH Right 02/26/2014   Procedure: INSERTION OF MESH;  Surgeon: Ralene Ok, MD;  Location: [facility];  Service: General;  Laterality: Right;  . LEFT HEART CATHETERIZATION WITH CORONARY ANGIOGRAM N/A 03/16/2011  Procedure: LEFT HEART CATHETERIZATION WITH CORONARY ANGIOGRAM;  Surgeon: Peter M Martinique, MD;  Location: [facility];  Service: Cardiovascular;  Laterality: N/A;  . PROSTATE BIOPSY     negative - [facility]  . TRANSURETHRAL RESECTION OF PROSTATE N/A 09/07/2016   Procedure: TRANSURETHRAL RESECTION OF THE PROSTATE (TURP);  Surgeon: Irine Seal, MD;  Location: [facility];  Service: Urology;  Laterality: N/A;    Family History  Problem Relation Age of Onset  . Hypertension Mother   . Multiple sclerosis Sister     Social History:  reports that he quit smoking about 63 years ago. His smoking use included cigarettes. He has a 3.00 pack-year smoking  history. He has quit using smokeless tobacco. He reports current alcohol use of about 14.0 standard drinks of alcohol per week. He reports that he does not use drugs.  Allergies:  Allergies  Allergen Reactions  . Doxycycline Other (See Comments)    Upset stomach  . Keflex [Cephalexin] Other (See Comments)    Abdominal discomfort 04-19-17 Pt has tolerated orally    Medications: I have reviewed the patient's current medications. Current Meds  Medication Sig  . acetaminophen (TYLENOL) 325 MG tablet Take 2 tablets (650 mg total) by mouth every 6 (six) hours as needed for moderate pain. Total acetaminophen intake not to exceed 408m total from all sources per 24 hours.  . carbidopa-levodopa (SINEMET) 25-100 MG tablet Take 1 tablet by mouth 3 (three) times daily.  .Marland Kitchendocusate sodium (COLACE) 100 MG capsule Take 100 mg by mouth daily.  .Marland Kitchenlisinopril-hydrochlorothiazide (PRINZIDE,ZESTORETIC) 10-12.5 MG tablet Take 1 tablet by mouth daily.  . Misc Natural Products (PROSTATE) CAPS Take 1 tablet daily by mouth. Prostate Plus otc supplement  . Multiple Vitamin (MULTIVITAMIN WITH MINERALS) TABS tablet Take 1 tablet daily by mouth.  . Multiple Vitamins-Minerals (PRESERVISION AREDS 2+MULTI VIT PO) Take 1 capsule by mouth 2 (two) times daily.  . rivaroxaban (XARELTO) 20 MG TABS tablet Take 1 tablet (20 mg total) by mouth daily with supper.  . timolol (TIMOPTIC) 0.5 % ophthalmic solution One drop in both eyes in the morning and one drop in the left eye at night. (Patient taking differently: Place 1 drop into both eyes 2 (two) times daily. )  . vitamin C (ASCORBIC ACID) 500 MG tablet Take 500 mg by mouth daily.     Results for orders placed or performed during the hospital encounter of 12/14/18 (from the past 48 hour(s))  CBC with Differential     Status: Abnormal   Collection Time: 12/14/18  3:14 PM  Result Value Ref Range   WBC 16.8 (H) 4.0 - 10.5 K/uL   RBC 4.34 4.22 - 5.81 MIL/uL   Hemoglobin 13.9  13.0 - 17.0 g/dL   HCT 43.6 39.0 - 52.0 %   MCV 100.5 (H) 80.0 - 100.0 fL   MCH 32.0 26.0 - 34.0 pg   MCHC 31.9 30.0 - 36.0 g/dL   RDW 14.1 11.5 - 15.5 %   Platelets 203 150 - 400 K/uL   nRBC 0.0 0.0 - 0.2 %   Neutrophils Relative % 90 %   Neutro Abs 15.1 (H) 1.7 - 7.7 K/uL   Lymphocytes Relative 5 %   Lymphs Abs 0.8 0.7 - 4.0 K/uL   Monocytes Relative 5 %   Monocytes Absolute 0.8 0.1 - 1.0 K/uL   Eosinophils Relative 0 %   Eosinophils Absolute 0.0 0.0 - 0.5 K/uL   Basophils Relative 0 %   Basophils Absolute  0.0 0.0 - 0.1 K/uL   Immature Granulocytes 0 %   Abs Immature Granulocytes 0.07 0.00 - 0.07 K/uL    Comment: Performed at [facility], [place] [address], [place], [place] [address]  Basic metabolic panel     Status: Abnormal   Collection Time: 12/14/18  3:14 PM  Result Value Ref Range   Sodium 139 135 - 145 mmol/L   Potassium 4.4 3.5 - 5.1 mmol/L   Chloride 105 98 - 111 mmol/L   CO2 21 (L) 22 - 32 mmol/L   Glucose, Bld 152 (H) 70 - 99 mg/dL   BUN 42 (H) 8 - 23 mg/dL   Creatinine, Ser 1.42 (H) 0.61 - 1.24 mg/dL   Calcium 9.5 8.9 - 10.3 mg/dL   GFR calc non Af Amer 43 (L) >60 mL/min   GFR calc Af Amer 50 (L) >60 mL/min   Anion gap 13 5 - 15    Comment: Performed at [facility] [address] [place], [place] [address]  Protime-INR     Status: Abnormal   Collection Time: 12/14/18  4:56 PM  Result Value Ref Range   Prothrombin Time 21.8 (H) 11.4 - 15.2 seconds   INR 1.9 (H) 0.8 - 1.2    Comment: (NOTE) INR goal varies based on device and disease states. Performed at [facility], [place] [address] [place], [place] [address]   APTT     Status: None   Collection Time: 12/14/18  4:56 PM  Result Value Ref Range   aPTT 33 24 - 36 seconds    Comment: Performed at [facility] [address], [place], [place] [address]  SARS CORONAVIRUS 2 (TAT 6-24 HRS) Nasopharyngeal Nasopharyngeal Swab     Status: None   Collection Time: 12/14/18  9:39 PM    Specimen: Nasopharyngeal Swab  Result Value Ref Range   SARS Coronavirus 2 NEGATIVE NEGATIVE    Comment: (NOTE) SARS-CoV-2 target nucleic acids are NOT DETECTED. The SARS-CoV-2 RNA is generally detectable in upper and lower respiratory specimens during the acute phase of infection. Negative results do not preclude SARS-CoV-2 infection, do not rule out co-infections with other pathogens, and should not be used as the sole basis for treatment or other patient management decisions. Negative results must be combined with clinical observations, patient history, and epidemiological information. The expected result is Negative. Fact Sheet for Patients: SugarRoll.be Fact Sheet for Healthcare Providers: https://www.woods-mathews.com/ This test is not yet approved or cleared by the [place] FDA and  has been authorized for detection and/or diagnosis of SARS-CoV-2 by FDA under an Emergency Use Authorization (EUA). This EUA will remain  in effect (meaning this test can be used) for the duration of the COVID-19 declaration under Section 56 4(b)(1) of the Act, 21 U.S.C. section 360bbb-3(b)(1), unless the authorization is terminated or revoked sooner. Performed at [facility], [place] [address] [place], [place] [address]   Surgical PCR screen     Status: None   Collection Time: 12/15/18  2:04 AM   Specimen: Nasal Mucosa; Nasal Swab  Result Value Ref Range   MRSA, PCR NEGATIVE NEGATIVE   Staphylococcus aureus NEGATIVE NEGATIVE    Comment: (NOTE) The Xpert SA Assay (FDA approved for NASAL specimens in patients 32 years of age and older), is one component of a comprehensive surveillance program. It is not intended to diagnose infection nor to guide or monitor treatment. Performed at [facility], [place]  [address] [place], [place] [address]     Dg Ribs Unilateral W/chest Left  Result Date: 12/14/2018 CLINICAL DATA:  Left lower rib  pain after fall EXAM: LEFT RIBS AND CHEST - 3+ VIEW COMPARISON:  Chest x-ray 04/19/2017 FINDINGS: No fracture or other bone lesions are seen involving the ribs. There is no evidence of pneumothorax or pleural effusion. Bilateral pleural plaques again noted. Stable cardiomegaly. IMPRESSION: No left-sided rib fracture identified. Electronically Signed   By: Davina Poke M.D.   On: 12/14/2018 16:24   Ct Head Wo Contrast  Result Date: 12/14/2018 CLINICAL DATA:  Per ed notes: Pt assisted out of car onto stretcher, here with family who sts he fell last night, injuring L hip. Pt unable to bear weight. Pt taking Xarelto. EXAM: CT HEAD WITHOUT CONTRAST TECHNIQUE: Contiguous axial images were obtained from the base of the skull through the vertex without intravenous contrast. COMPARISON:  None. FINDINGS: Brain: There is central and cortical atrophy. Periventricular white matter changes are consistent with small vessel disease. No evidence of acute infarction, hemorrhage, hydrocephalus, extra-axial collection or mass lesion/mass effect. Vascular: No hyperdense vessel or unexpected calcification. Skull: Normal. Negative for fracture or focal lesion. Sinuses/Orbits: No acute finding. Other: None. IMPRESSION: 1. No evidence for acute intracranial abnormality. 2. Atrophy and small vessel disease. Electronically Signed   By: Nolon Nations M.D.   On: 12/14/2018 18:16   Dg Hip Unilat W Or Wo Pelvis 2-3 Views Left  Result Date: 12/14/2018 CLINICAL DATA:  Pt fell last night and experiencing LEFT-sided pain. Pt c/o lower rib pain radiating, L hip pain and L leg pain. EXAM: DG HIP (WITH OR WITHOUT PELVIS) 2-3V LEFT COMPARISON:  01/12/2012 FINDINGS: There is an acute impacted fracture of the LEFT femoral neck, associated with varus angulation. Interval RIGHT hip arthroplasty. Degenerative changes are seen in the LOWER lumbar spine. IMPRESSION: Acute fracture of the LEFT femoral neck. Electronically Signed   By: Nolon Nations M.D.   On: 12/14/2018 16:22   Dg Femur Min 2 Views Left  Result Date: 12/14/2018 CLINICAL DATA:  Pt fell last night and experiencing L sided pain. Pt c/o lower rib pain radiating, L hip pain and L leg pain. EXAM: LEFT FEMUR 2 VIEWS COMPARISON:  01/12/2012 FINDINGS: There is an acute impacted fracture of the LEFT femoral neck, associated with varus angulation. There is dense atherosclerotic calcification of the femoral artery. IMPRESSION: Acute fracture of the LEFT femoral neck. Electronically Signed   By: Nolon Nations M.D.   On: 12/14/2018 16:21    Review of Systems  Constitutional: Negative for chills and fever.  Respiratory: Negative for shortness of breath and wheezing.   Cardiovascular: Negative for chest pain and palpitations.  Gastrointestinal: Negative for abdominal pain, nausea and vomiting.   Blood pressure 108/73, pulse (!) 39, temperature 98.3 F (36.8 C), temperature source Oral, resp. rate 17, height _0  (1.803 m), weight 78.6 kg, SpO2 96 %. Physical Exam Vitals signs and nursing note reviewed.  Constitutional:      Comments: Pleasant   Pulmonary:     Effort: No respiratory distress.  Musculoskeletal:     Comments: Left Lower Extremity   Left leg shortened and externally rotated Soft tissue L leg is free of wounds or lesions Swelling minimal to L thigh Knee nontender Ankle and foot are nontender EHL, FHL, lesser toe motor intact Ankle flexion, extension, inversion and eversion intact No DCT  Compartments are soft  Ext warm  + DP pulse  Skin:    General: Skin is warm.     Capillary Refill: Capillary refill takes less than 2 seconds.  Neurological:     Mental Status: He is alert and oriented to person, place, and time.     Comments: Did not assess coordination or gait   Psychiatric:        Behavior: Behavior is cooperative.       Assessment/Plan:  85 y/o male s/p fall with L femoral neck fracture  -displaced L femoral neck fracture   Recommend L hip hemiarthroplasty to give pt the best chance of getting back to baseline function   WBAT post op  Post. Hip precautions   Wife really wants to take pt home after hospital. She does not want him in a SNF  ?CIR    Bed rest for now    Pt is comfortable currently, did offer bucks traction but again he is quite comfortable   - Pain management:  Continue with current regimen  - ABL anemia/Hemodynamics  Cbc in am  Type and screen  - Medical issues   Per primary   - DVT/PE prophylaxis:  Restart xarelto post op   - ID:   periop abx   - Metabolic Bone Disease:  Check vitamin d levels  2nd hip fracture in a year and half  Suggestive of very poor bone quality    - FEN/GI prophylaxis/Foley/Lines:  NPO after MN   - Dispo:  OR tomorrow for L hip hemi     Jari Pigg, PA-C [phone] (C) 12/15/2018, 7:35 PM  [facility] [place] [address] [phone] Domingo Sep (F)

## 2018-12-15 NOTE — Telephone Encounter (Signed)
I have attempted to call pt and his wife to inform them to go to the ED if they think a fractured hip is in question. We are unable to admit patient from the out patient setting. Pt should go straight to ED with EMS assistance. No answer so left message to call back

## 2018-12-15 NOTE — Care Management (Signed)
CM consult acknowledged to assist with any HH/DME needs. Awaiting PT/OT eval for DCP recommendations and will continue to follow.  Evolette Pendell RN, BSN, NCM-BC, ACM-RN [phone] 

## 2018-12-15 NOTE — Progress Notes (Addendum)
This pt has a history of complete Heart Block for 20 yrs. Noted NSTEMI in 2013, EF at 65-70%. Pt on Xarelto  for Afib.   Pt oriented to room environment, bed exit alarm maintained. Call light and phone in reach. Pt denies pain at this time.

## 2018-12-15 NOTE — Plan of Care (Signed)
  Problem: Coping: Goal: Level of anxiety will decrease Outcome: Progressing   Problem: Pain Managment: Goal: General experience of comfort will improve Outcome: Progressing   Problem: Safety: Goal: Ability to remain free from injury will improve Outcome: Progressing   

## 2018-12-15 NOTE — Progress Notes (Addendum)
Orthopaedic Trauma Service   Contacted by Dr. French Ana about pt with Left femoral neck fracture  Will take to OR tomorrow am (12/16/2018) for Left hip hemiarthroplasty  NPO after MN  Resume xarelto post op  Bed rest for now  WBAT post op with post. Hip precautions   Jari Pigg, PA-C [phone] (C) 12/15/2018, 11:08 AM  [facility] [place] [address] [phone] Jenetta Downer309 [phone] (F)   After 6pm on weekdays please call office number to get in touch with on call provider or refer to [place] and look to see who is on call for the Sports Medicine Call Group which is listed under orthopaedics   On Weekends please call office number to get in touch with on call provider or refer to [place] and look to see who is on call for the Sports Medicine Call Group which is listed under orthopaedics  .

## 2018-12-15 NOTE — Progress Notes (Signed)
PROGRESS NOTE    Chad Russell  [id number] DOB: [birth year] DOA: 12/14/2018 PCP: [name], MD    Brief Narrative: 85 y.o. male with medical history significant of Parkinson's disease, complete heart block (x 20 years with no indication for pacemaker), CAD, atrial fibrillation on Xarelto, hypertension, OSA not on CPAP.  He presented on 10/22 with left hip pain status post a mechanical fall the night before. Patient normally walks with a walker but did not use it last night and had a mechanical fall in the foyer of his home.  The following morning he was unable to bear weight or get out of bed so EMS was called.  Patient denies history of frequent falls.  He denies any lightheadedness or dizziness prior to the fall. He hit his head but no loss of consciousness.  No chest pain or shortness of breath.  He is on Xarelto for his atrial fibrillation and last took this at 9 PM on 10/21.  Upon arrival in the ED patient reported pain only with movement of his left upper extremity.  Currently, patient is also reported pain to his left lower ribs.  In the ED, patient afebrile, normotensive, bradycardic in the 40s but asymptomatic.  Labs notable for leukocytosis of 16.8, creatinine elevated at 1.42 (previously normal).  CT head showed no acute findings.  No rib fractures seen on rib or chest x-rays.  Hip and femur x-ray did show acute fracture of the left femoral neck.  Orthopedics was consulted and recommended admission to hospitalist service with hip fracture protocol and medical optimization for surgery.  Xarelto held.  Plan is for surgery on 10/24 for left hip hemiarthroplasty with Dr. Eddie Dibbles.  Subjective 10/23: Patient seen and examined, asleep sitting up in bed, awoke easily.  Reports pain is currently okay.  Denies chest pain, shortness of breath, fever, chills, nausea, vomiting or diarrhea.   Assessment & Plan:   Principal Problem:   Fracture of femoral neck, left (HCC) Active Problems:   Complete heart block (HCC)   HTN (hypertension)   Atrial fibrillation (HCC)   AKI (acute kidney injury) (HCC)   BPH (benign prostatic hyperplasia)   Dementia due to Parkinson's disease without behavioral disturbance ([place])   Left femoral neck fracture s/p  Mechanical Fall - Ortho plans for surgery tomorrow morning (10/24) -N.p.o. after midnight -Hold Xarelto -Bedrest for now -Weightbearing as tolerated postop -Hip precautions -Pain control:    Norco every 6 hours PRN moderate pain   Morphine 0.5 mg IV every 2 hours PRN severe pain -Bowel regimen   Acute kidney injury -suspect prerenal in the setting of dehydration.  Received 1 L NS in the ED. -Hold lisinopril HCTZ for now -Avoid nephrotoxic agents and renally dose meds as indicated -BMP in the a.m.  Atrial fibrillation -chronic, rate controlled.  CHA2DS2-VASc 3.  Last Xarelto taken at 9 PM 10/21. - Hold Xarelto, resume postop  Essentialhypertension -chronic, currently with soft blood pressures - Hold lisinopril HCTZ   Asymptomatic bradycardia in the setting complete heart block -Stable -No pacemaker indication.   -Followed by cardiology outpatient  Parkinson's disease - Continue carbidopa levodopa  Left ear lesion -  -Wife reports he had basal cell skin carcinoma removed outpatient recently -Wound care per RN   DVT prophylaxis: SCDs Code Status:   Code Status: Full Code Family Communication: None at bedside Disposition Plan: Pending surgery and PT eval postoperatively, likely to rehab or home with home health PT in a couple days.  Consultants:   Ortho  Procedures:   None  Antimicrobials:   None   Objective: Vitals:   12/15/18 0324 12/15/18 0436 12/15/18 0826 12/15/18 1427  BP:  101/61 104/65 108/73  Pulse:  (!) 40 89 (!) 39  Resp:  20 17 17   Temp:  98.7 F (37.1 C) (!) 97.5 F (36.4 C) 98.3 F (36.8 C)  TempSrc:   Oral Oral  SpO2:  90% 95% 96%  Weight: 78.6 kg     Height: 5\' 11"   (1.803 m)       Intake/Output Summary (Last 24 hours) at 12/15/2018 1635 Last data filed at 12/15/2018 1520 Gross per 24 hour  Intake 1240 ml  Output --  Net 1240 ml   Filed Weights   12/15/18 0324  Weight: 78.6 kg    Examination:  General exam: Sleeping, awoke easily, no acute distress, frail HEENT: Left preauricular lesion with dressing in place without warmth erythema or purulence, dry mucus membranes, hearing grossly normal  Respiratory system: clear to auscultation bilaterally, no wheezes, rales or rhonchi, normal respiratory effort. Cardiovascular system: normal S1/S2, bradycardic, regular rhythm, no JVD, murmurs, rubs, gallops, no pedal edema.   Gastrointestinal system: soft, non-tender, non-distended abdomen, normal bowel sounds. Central nervous system: alert and oriented x3. no gross focal neurologic deficits, normal speech Extremities: moves all, no edema, normal tone Skin: dry, intact, normal temperature Psychiatry: normal mood, congruent affect, judgement and insight appear normal    Data Reviewed: I have personally reviewed following labs and imaging studies  CBC: Recent Labs  Lab 12/14/18 1514  WBC 16.8*  NEUTROABS 15.1*  HGB 13.9  HCT 43.6  MCV 100.5*  PLT 123456   Basic Metabolic Panel: Recent Labs  Lab 12/14/18 1514  NA 139  K 4.4  CL 105  CO2 21*  GLUCOSE 152*  BUN 42*  CREATININE 1.42*  CALCIUM 9.5   GFR: Estimated Creatinine Clearance: 36.8 mL/min (A) (by C-G formula based on SCr of 1.42 mg/dL (H)). Liver Function Tests: No results for input(s): AST, ALT, ALKPHOS, BILITOT, PROT, ALBUMIN in the last 168 hours. No results for input(s): LIPASE, AMYLASE in the last 168 hours. No results for input(s): AMMONIA in the last 168 hours. Coagulation Profile: Recent Labs  Lab 12/14/18 1656  INR 1.9*   Cardiac Enzymes: No results for input(s): CKTOTAL, CKMB, CKMBINDEX, TROPONINI in the last 168 hours. BNP (last 3 results) No results for  input(s): PROBNP in the last 8760 hours. HbA1C: No results for input(s): HGBA1C in the last 72 hours. CBG: No results for input(s): GLUCAP in the last 168 hours. Lipid Profile: No results for input(s): CHOL, HDL, LDLCALC, TRIG, CHOLHDL, LDLDIRECT in the last 72 hours. Thyroid Function Tests: No results for input(s): TSH, T4TOTAL, FREET4, T3FREE, THYROIDAB in the last 72 hours. Anemia Panel: No results for input(s): VITAMINB12, FOLATE, FERRITIN, TIBC, IRON, RETICCTPCT in the last 72 hours. Sepsis Labs: No results for input(s): PROCALCITON, LATICACIDVEN in the last 168 hours.  Recent Results (from the past 240 hour(s))  SARS CORONAVIRUS 2 (TAT 6-24 HRS) Nasopharyngeal Nasopharyngeal Swab     Status: None   Collection Time: 12/14/18  9:39 PM   Specimen: Nasopharyngeal Swab  Result Value Ref Range Status   SARS Coronavirus 2 NEGATIVE NEGATIVE Final    Comment: (NOTE) SARS-CoV-2 target nucleic acids are NOT DETECTED. The SARS-CoV-2 RNA is generally detectable in upper and lower respiratory specimens during the acute phase of infection. Negative results do not preclude SARS-CoV-2 infection, do not rule  out co-infections with other pathogens, and should not be used as the sole basis for treatment or other patient management decisions. Negative results must be combined with clinical observations, patient history, and epidemiological information. The expected result is Negative. Fact Sheet for Patients: SugarRoll.be Fact Sheet for Healthcare Providers: https://www.woods-mathews.com/ This test is not yet approved or cleared by the [place] FDA and  has been authorized for detection and/or diagnosis of SARS-CoV-2 by FDA under an Emergency Use Authorization (EUA). This EUA will remain  in effect (meaning this test can be used) for the duration of the COVID-19 declaration under Section 56 4(b)(1) of the Act, 21 U.S.C. section 360bbb-3(b)(1),  unless the authorization is terminated or revoked sooner. Performed at [facility], [place] [address] [place], [place] [address]   Surgical PCR screen     Status: None   Collection Time: 12/15/18  2:04 AM   Specimen: Nasal Mucosa; Nasal Swab  Result Value Ref Range Status   MRSA, PCR NEGATIVE NEGATIVE Final   Staphylococcus aureus NEGATIVE NEGATIVE Final    Comment: (NOTE) The Xpert SA Assay (FDA approved for NASAL specimens in patients 7 years of age and older), is one component of a comprehensive surveillance program. It is not intended to diagnose infection nor to guide or monitor treatment. Performed at [facility], [place] [address], [place], [place] [address]          Radiology Studies: Dg Ribs Unilateral W/chest Left  Result Date: 12/14/2018 CLINICAL DATA:  Left lower rib pain after fall EXAM: LEFT RIBS AND CHEST - 3+ VIEW COMPARISON:  Chest x-ray 04/19/2017 FINDINGS: No fracture or other bone lesions are seen involving the ribs. There is no evidence of pneumothorax or pleural effusion. Bilateral pleural plaques again noted. Stable cardiomegaly. IMPRESSION: No left-sided rib fracture identified. Electronically Signed   By: Davina Poke M.D.   On: 12/14/2018 16:24   Ct Head Wo Contrast  Result Date: 12/14/2018 CLINICAL DATA:  Per ed notes: Pt assisted out of car onto stretcher, here with family who sts he fell last night, injuring L hip. Pt unable to bear weight. Pt taking Xarelto. EXAM: CT HEAD WITHOUT CONTRAST TECHNIQUE: Contiguous axial images were obtained from the base of the skull through the vertex without intravenous contrast. COMPARISON:  None. FINDINGS: Brain: There is central and cortical atrophy. Periventricular white matter changes are consistent with small vessel disease. No evidence of acute infarction, hemorrhage, hydrocephalus, extra-axial collection or mass lesion/mass effect. Vascular: No hyperdense vessel or unexpected calcification. Skull:  Normal. Negative for fracture or focal lesion. Sinuses/Orbits: No acute finding. Other: None. IMPRESSION: 1. No evidence for acute intracranial abnormality. 2. Atrophy and small vessel disease. Electronically Signed   By: Nolon Nations M.D.   On: 12/14/2018 18:16   Dg Hip Unilat W Or Wo Pelvis 2-3 Views Left  Result Date: 12/14/2018 CLINICAL DATA:  Pt fell last night and experiencing LEFT-sided pain. Pt c/o lower rib pain radiating, L hip pain and L leg pain. EXAM: DG HIP (WITH OR WITHOUT PELVIS) 2-3V LEFT COMPARISON:  01/12/2012 FINDINGS: There is an acute impacted fracture of the LEFT femoral neck, associated with varus angulation. Interval RIGHT hip arthroplasty. Degenerative changes are seen in the LOWER lumbar spine. IMPRESSION: Acute fracture of the LEFT femoral neck. Electronically Signed   By: Nolon Nations M.D.   On: 12/14/2018 16:22   Dg Femur Min 2 Views Left  Result Date: 12/14/2018 CLINICAL DATA:  Pt fell last night and  experiencing L sided pain. Pt c/o lower rib pain radiating, L hip pain and L leg pain. EXAM: LEFT FEMUR 2 VIEWS COMPARISON:  01/12/2012 FINDINGS: There is an acute impacted fracture of the LEFT femoral neck, associated with varus angulation. There is dense atherosclerotic calcification of the femoral artery. IMPRESSION: Acute fracture of the LEFT femoral neck. Electronically Signed   By: Nolon Nations M.D.   On: 12/14/2018 16:21        Scheduled Meds:  carbidopa-levodopa  1 tablet Oral TID   mupirocin ointment  1 application Nasal BID   [START ON 12/16/2018] pneumococcal 23 valent vaccine  0.5 mL Intramuscular Tomorrow-1000   Continuous Infusions:   LOS: 1 day    Time spent: 30 to 35 minutes    Ezekiel Slocumb, DO [facility] Pager: [phone]  If 7PM-7AM, please contact night-coverage [email or web address] Password [facility] 12/15/2018, 4:35 PM

## 2018-12-16 ENCOUNTER — Inpatient Hospital Stay (HOSPITAL_COMMUNITY): Payer: Medicare HMO | Admitting: Anesthesiology

## 2018-12-16 ENCOUNTER — Encounter (HOSPITAL_COMMUNITY): Payer: Self-pay | Admitting: Certified Registered Nurse Anesthetist

## 2018-12-16 ENCOUNTER — Encounter (HOSPITAL_COMMUNITY)
Admission: EM | Disposition: A | Discharge status: Skilled Nursing Facility | Payer: Self-pay | Source: Home / Self Care | Attending: Internal Medicine

## 2018-12-16 ENCOUNTER — Inpatient Hospital Stay (HOSPITAL_COMMUNITY): Payer: Medicare HMO

## 2018-12-16 DIAGNOSIS — I10 Essential (primary) hypertension: Secondary | ICD-10-CM

## 2018-12-16 HISTORY — PX: HIP ARTHROPLASTY: SHX981

## 2018-12-16 LAB — BASIC METABOLIC PANEL
Anion gap: 9 (ref 5–15)
BUN: 38 mg/dL — ABNORMAL HIGH (ref 8–23)
CO2: 22 mmol/L (ref 22–32)
Calcium: 9 mg/dL (ref 8.9–10.3)
Chloride: 109 mmol/L (ref 98–111)
Creatinine, Ser: 1.12 mg/dL (ref 0.61–1.24)
GFR calc Af Amer: >60 mL/min (ref >60)
GFR calc non Af Amer: 58 mL/min — ABNORMAL LOW (ref >60)
Glucose, Bld: 102 mg/dL — ABNORMAL HIGH (ref 70–99)
Potassium: 3.9 mmol/L (ref 3.5–5.1)
Sodium: 140 mmol/L (ref 135–145)

## 2018-12-16 LAB — VITAMIN D 25 HYDROXY (VIT D DEFICIENCY, FRACTURES): Vit D, 25-Hydroxy: 60.24 ng/mL (ref 30–100)

## 2018-12-16 LAB — CBC
HCT: 37 % — ABNORMAL LOW (ref 39.0–52.0)
Hemoglobin: 12.5 g/dL — ABNORMAL LOW (ref 13.0–17.0)
MCH: 32.7 pg (ref 26.0–34.0)
MCHC: 33.8 g/dL (ref 30.0–36.0)
MCV: 96.9 fL (ref 80.0–100.0)
Platelets: 172 10*3/uL (ref 150–400)
RBC: 3.82 MIL/uL — ABNORMAL LOW (ref 4.22–5.81)
RDW: 14.5 % (ref 11.5–15.5)
WBC: 10.3 10*3/uL (ref 4.0–10.5)
nRBC: 0 % (ref 0.0–0.2)

## 2018-12-16 LAB — TYPE AND SCREEN
ABO/RH(D): O POS
Antibody Screen: NEGATIVE

## 2018-12-16 LAB — MAGNESIUM: Magnesium: 2 mg/dL (ref 1.7–2.4)

## 2018-12-16 SURGERY — HEMIARTHROPLASTY, HIP, DIRECT ANTERIOR APPROACH, FOR FRACTURE
Anesthesia: General | Site: Hip | Laterality: Left

## 2018-12-16 MED ORDER — EPINEPHRINE 1 MG/10ML IJ SOSY
PREFILLED_SYRINGE | INTRAMUSCULAR | Status: AC
  Filled 2018-12-16: qty 10

## 2018-12-16 MED ORDER — DEXAMETHASONE SODIUM PHOSPHATE 10 MG/ML IJ SOLN
INTRAMUSCULAR | Status: AC
  Filled 2018-12-16: qty 1

## 2018-12-16 MED ORDER — MENTHOL 3 MG MT LOZG: 1.0000 | LOZENGE | OROMUCOSAL | Status: DC | PRN

## 2018-12-16 MED ORDER — 0.9 % SODIUM CHLORIDE (POUR BTL) OPTIME
TOPICAL | Status: DC | PRN
  Administered 2018-12-16: 1000 mL

## 2018-12-16 MED ORDER — LIDOCAINE 2% (20 MG/ML) 5 ML SYRINGE
INTRAMUSCULAR | Status: AC
  Filled 2018-12-16: qty 5

## 2018-12-16 MED ORDER — SUGAMMADEX SODIUM 200 MG/2ML IV SOLN
INTRAVENOUS | Status: DC | PRN
  Administered 2018-12-16: 200 mg via INTRAVENOUS

## 2018-12-16 MED ORDER — PHENYLEPHRINE 40 MCG/ML (10ML) SYRINGE FOR IV PUSH (FOR BLOOD PRESSURE SUPPORT)
PREFILLED_SYRINGE | INTRAVENOUS | Status: AC
  Filled 2018-12-16: qty 10

## 2018-12-16 MED ORDER — METOCLOPRAMIDE HCL 5 MG PO TABS: 5.0000 mg | ORAL_TABLET | Freq: Three times a day (TID) | ORAL | Status: DC | PRN

## 2018-12-16 MED ORDER — ATROPINE SULFATE 0.4 MG/ML IJ SOLN
INTRAMUSCULAR | Status: DC | PRN
  Administered 2018-12-16: 0.4 mg via INTRAVENOUS

## 2018-12-16 MED ORDER — FENTANYL CITRATE (PF) 100 MCG/2ML IJ SOLN
INTRAMUSCULAR | Status: DC | PRN
  Administered 2018-12-16 (×2): 25 ug via INTRAVENOUS

## 2018-12-16 MED ORDER — ROCURONIUM BROMIDE 10 MG/ML (PF) SYRINGE
PREFILLED_SYRINGE | INTRAVENOUS | Status: AC
  Filled 2018-12-16: qty 10

## 2018-12-16 MED ORDER — FENTANYL CITRATE (PF) 250 MCG/5ML IJ SOLN
INTRAMUSCULAR | Status: AC
  Filled 2018-12-16: qty 5

## 2018-12-16 MED ORDER — PROPOFOL 10 MG/ML IV BOLUS
INTRAVENOUS | Status: AC
  Filled 2018-12-16: qty 20

## 2018-12-16 MED ORDER — CEFAZOLIN SODIUM-DEXTROSE 2-3 GM-%(50ML) IV SOLR
INTRAVENOUS | Status: DC | PRN
  Administered 2018-12-16: 2 g via INTRAVENOUS

## 2018-12-16 MED ORDER — PHENOL 1.4 % MT LIQD: 1.0000 | OROMUCOSAL | Status: DC | PRN

## 2018-12-16 MED ORDER — ACETAMINOPHEN 500 MG PO TABS
500.0000 mg | ORAL_TABLET | Freq: Three times a day (TID) | ORAL | Status: DC
  Administered 2018-12-16 – 2018-12-26 (×29): 500 mg via ORAL
  Filled 2018-12-16 (×30): qty 1

## 2018-12-16 MED ORDER — ONDANSETRON HCL 4 MG/2ML IJ SOLN
INTRAMUSCULAR | Status: AC
  Filled 2018-12-16: qty 2

## 2018-12-16 MED ORDER — DOCUSATE SODIUM 100 MG PO CAPS
100.0000 mg | ORAL_CAPSULE | Freq: Two times a day (BID) | ORAL | Status: DC
  Administered 2018-12-16 – 2018-12-26 (×18): 100 mg via ORAL
  Filled 2018-12-16 (×20): qty 1

## 2018-12-16 MED ORDER — SODIUM CHLORIDE 0.9 % IR SOLN
Status: DC | PRN
  Administered 2018-12-16: 3000 mL

## 2018-12-16 MED ORDER — ONDANSETRON HCL 4 MG/2ML IJ SOLN
4.0000 mg | Freq: Four times a day (QID) | INTRAMUSCULAR | Status: DC | PRN
  Administered 2018-12-16: 4 mg via INTRAVENOUS
  Filled 2018-12-16: qty 2

## 2018-12-16 MED ORDER — RIVAROXABAN 20 MG PO TABS
20.0000 mg | ORAL_TABLET | Freq: Every day | ORAL | Status: DC
  Administered 2018-12-16 – 2018-12-26 (×11): 20 mg via ORAL
  Filled 2018-12-16 (×11): qty 1

## 2018-12-16 MED ORDER — EPHEDRINE SULFATE 50 MG/ML IJ SOLN
INTRAMUSCULAR | Status: DC | PRN
  Administered 2018-12-16 (×2): 5 mg via INTRAVENOUS
  Administered 2018-12-16: 10 mg via INTRAVENOUS

## 2018-12-16 MED ORDER — DEXAMETHASONE SODIUM PHOSPHATE 10 MG/ML IJ SOLN
INTRAMUSCULAR | Status: DC | PRN
  Administered 2018-12-16: 4 mg via INTRAVENOUS

## 2018-12-16 MED ORDER — METOCLOPRAMIDE HCL 5 MG/ML IJ SOLN: 5.0000 mg | Freq: Three times a day (TID) | INTRAMUSCULAR | Status: DC | PRN

## 2018-12-16 MED ORDER — LIDOCAINE 2% (20 MG/ML) 5 ML SYRINGE
INTRAMUSCULAR | Status: DC | PRN
  Administered 2018-12-16: 20 mg via INTRAVENOUS

## 2018-12-16 MED ORDER — ROCURONIUM BROMIDE 50 MG/5ML IV SOSY
PREFILLED_SYRINGE | INTRAVENOUS | Status: DC | PRN
  Administered 2018-12-16 (×2): 30 mg via INTRAVENOUS
  Administered 2018-12-16: 20 mg via INTRAVENOUS

## 2018-12-16 MED ORDER — PROPOFOL 10 MG/ML IV BOLUS
INTRAVENOUS | Status: DC | PRN
  Administered 2018-12-16: 30 mg via INTRAVENOUS
  Administered 2018-12-16: 70 mg via INTRAVENOUS

## 2018-12-16 MED ORDER — ONDANSETRON HCL 4 MG PO TABS: 4.0000 mg | ORAL_TABLET | Freq: Four times a day (QID) | ORAL | Status: DC | PRN

## 2018-12-16 MED ORDER — LACTATED RINGERS IV SOLN: INTRAVENOUS | Status: DC

## 2018-12-16 MED ORDER — ONDANSETRON HCL 4 MG/2ML IJ SOLN
INTRAMUSCULAR | Status: DC | PRN
  Administered 2018-12-16: 4 mg via INTRAVENOUS

## 2018-12-16 MED ORDER — LACTATED RINGERS IV SOLN
INTRAVENOUS | Status: DC | PRN
  Administered 2018-12-16 (×2): via INTRAVENOUS

## 2018-12-16 MED ORDER — STERILE WATER FOR IRRIGATION IR SOLN
Status: DC | PRN
  Administered 2018-12-16: 1000 mL

## 2018-12-16 MED ORDER — CEFAZOLIN SODIUM-DEXTROSE 2-4 GM/100ML-% IV SOLN
2.0000 g | Freq: Four times a day (QID) | INTRAVENOUS | Status: AC
  Administered 2018-12-16 (×2): 2 g via INTRAVENOUS
  Filled 2018-12-16 (×2): qty 100

## 2018-12-16 MED ORDER — CEFAZOLIN SODIUM-DEXTROSE 2-4 GM/100ML-% IV SOLN
INTRAVENOUS | Status: AC
  Filled 2018-12-16: qty 100

## 2018-12-16 MED ORDER — ALBUMIN HUMAN 5 % IV SOLN
INTRAVENOUS | Status: DC | PRN
  Administered 2018-12-16: 10:00:00 via INTRAVENOUS

## 2018-12-16 SURGICAL SUPPLY — 61 items
BLADE SAW SGTL 73X25 THK (BLADE) ×2 IMPLANT
BNDG COHESIVE 3X5 TAN STRL LF (GAUZE/BANDAGES/DRESSINGS) ×1 IMPLANT
BRUSH FEMORAL CANAL (MISCELLANEOUS) IMPLANT
BRUSH SCRUB EZ PLAIN DRY (MISCELLANEOUS) ×4 IMPLANT
COVER MAYO STAND STRL (DRAPES) ×1 IMPLANT
COVER SURGICAL LIGHT HANDLE (MISCELLANEOUS) ×2 IMPLANT
COVER WAND RF STERILE (DRAPES) ×2 IMPLANT
DRAPE INCISE IOBAN 85X60 (DRAPES) ×2 IMPLANT
DRAPE ORTHO SPLIT 77X108 STRL (DRAPES) ×4
DRAPE SURG ORHT 6 SPLT 77X108 (DRAPES) ×2 IMPLANT
DRAPE U-SHAPE 47X51 STRL (DRAPES) ×2 IMPLANT
DRSG MEPILEX BORDER 4X12 (GAUZE/BANDAGES/DRESSINGS) ×1 IMPLANT
DRSG MEPILEX BORDER 4X8 (GAUZE/BANDAGES/DRESSINGS) ×2 IMPLANT
ELECT BLADE 6.5 EXT (BLADE) ×1 IMPLANT
ELECT CAUTERY BLADE 6.4 (BLADE) ×1 IMPLANT
ELECT REM PT RETURN 9FT ADLT (ELECTROSURGICAL) ×2
ELECTRODE REM PT RTRN 9FT ADLT (ELECTROSURGICAL) ×1 IMPLANT
GLOVE BIO SURGEON STRL SZ7.5 (GLOVE) ×3 IMPLANT
GLOVE BIO SURGEON STRL SZ8 (GLOVE) ×3 IMPLANT
GLOVE BIOGEL PI IND STRL 8 (GLOVE) ×2 IMPLANT
GLOVE BIOGEL PI INDICATOR 8 (GLOVE) ×2
GOWN STRL REUS W/ TWL LRG LVL3 (GOWN DISPOSABLE) ×2 IMPLANT
GOWN STRL REUS W/ TWL XL LVL3 (GOWN DISPOSABLE) ×1 IMPLANT
GOWN STRL REUS W/TWL 2XL LVL3 (GOWN DISPOSABLE) IMPLANT
GOWN STRL REUS W/TWL LRG LVL3 (GOWN DISPOSABLE)
GOWN STRL REUS W/TWL XL LVL3 (GOWN DISPOSABLE) ×8
HANDPIECE INTERPULSE COAX TIP (DISPOSABLE) ×2
HEAD FEM UNIPOLAR 56 OD STRL (Hips) ×1 IMPLANT
IMMOBILIZER KNEE 22 UNIV (SOFTGOODS) ×1 IMPLANT
KIT BASIN OR (CUSTOM PROCEDURE TRAY) ×2 IMPLANT
KIT TURNOVER KIT B (KITS) ×2 IMPLANT
MANIFOLD NEPTUNE II (INSTRUMENTS) ×2 IMPLANT
NDL 1/2 CIR MAYO (NEEDLE) IMPLANT
NEEDLE 1/2 CIR MAYO (NEEDLE) ×2 IMPLANT
NS IRRIG 1000ML POUR BTL (IV SOLUTION) ×2 IMPLANT
PACK TOTAL JOINT (CUSTOM PROCEDURE TRAY) ×2 IMPLANT
PAD ARMBOARD 7.5X6 YLW CONV (MISCELLANEOUS) ×4 IMPLANT
PAD ONESTEP ZOLL R SERIES ADT (MISCELLANEOUS) ×1 IMPLANT
PILLOW ABDUCTION MEDIUM (MISCELLANEOUS) IMPLANT
PRESSURIZER FEMORAL UNIV (MISCELLANEOUS) IMPLANT
RETRIEVER SUT HEWSON (MISCELLANEOUS) ×2 IMPLANT
SET HNDPC FAN SPRY TIP SCT (DISPOSABLE) IMPLANT
SPACER FEM TAPERED +0 12/14 (Hips) ×1 IMPLANT
STAPLER VISISTAT 35W (STAPLE) ×2 IMPLANT
STEM SUMMIT PRESSFIT HIP SZ7 (Hips) ×1 IMPLANT
SUT ETHILON 2 0 PSLX (SUTURE) ×4 IMPLANT
SUT FIBERWIRE #2 38 T-5 BLUE (SUTURE) ×4
SUT VIC AB 0 CT1 27 (SUTURE) ×4
SUT VIC AB 0 CT1 27XBRD ANBCTR (SUTURE) IMPLANT
SUT VIC AB 1 CT1 18XCR BRD 8 (SUTURE) ×1 IMPLANT
SUT VIC AB 1 CT1 27 (SUTURE) ×2
SUT VIC AB 1 CT1 27XBRD ANBCTR (SUTURE) ×1 IMPLANT
SUT VIC AB 1 CT1 8-18 (SUTURE) ×4
SUT VIC AB 2-0 CT1 27 (SUTURE) ×4
SUT VIC AB 2-0 CT1 TAPERPNT 27 (SUTURE) ×2 IMPLANT
SUTURE FIBERWR #2 38 T-5 BLUE (SUTURE) ×2 IMPLANT
TOWEL GREEN STERILE (TOWEL DISPOSABLE) ×2 IMPLANT
TOWEL GREEN STERILE FF (TOWEL DISPOSABLE) ×2 IMPLANT
TOWER CARTRIDGE SMART MIX (DISPOSABLE) IMPLANT
WATER STERILE IRR 1000ML POUR (IV SOLUTION) ×2 IMPLANT
YANKAUER SUCT BULB TIP NO VENT (SUCTIONS) ×1 IMPLANT

## 2018-12-16 NOTE — Anesthesia Postprocedure Evaluation (Signed)
Anesthesia Post Note  Patient: Chad Russell  Procedure(s) Performed: ARTHROPLASTY BIPOLAR HIP (HEMIARTHROPLASTY) (Left Hip)     Patient location during evaluation: PACU Anesthesia Type: General Level of consciousness: awake and alert Pain management: pain level controlled Vital Signs Assessment: post-procedure vital signs reviewed and stable Respiratory status: spontaneous breathing, nonlabored ventilation, respiratory function stable and patient connected to nasal cannula oxygen Cardiovascular status: blood pressure returned to baseline and stable Postop Assessment: no apparent nausea or vomiting Anesthetic complications: no    Last Vitals:  Vitals:   12/16/18 1145 12/16/18 1158  BP: 111/69 109/71  Pulse: (!) 39 (!) 38  Resp: 19   Temp: (!) 36.4 C   SpO2: 99% 96%    Last Pain:  Vitals:   12/16/18 1158  TempSrc:   PainSc: 0-No pain                 Tiajuana Amass

## 2018-12-16 NOTE — Transfer of Care (Signed)
Immediate Anesthesia Transfer of Care Note  Patient: Chad Russell  Procedure(s) Performed: ARTHROPLASTY BIPOLAR HIP (HEMIARTHROPLASTY) (Left Hip)  Patient Location: PACU  Anesthesia Type:General  Level of Consciousness: awake and drowsy  Airway & Oxygen Therapy: Patient Spontanous Breathing and Patient connected to nasal cannula oxygen  Post-op Assessment: Report given to RN and Post -op Vital signs reviewed and stable  Post vital signs: Reviewed and stable  Last Vitals:  Vitals Value Taken Time  BP 112/89 12/16/18 1116  Temp    Pulse 37 12/16/18 1119  Resp 16 12/16/18 1119  SpO2 100 % 12/16/18 1119  Vitals shown include unvalidated device data.  Last Pain:  Vitals:   12/16/18 0725  TempSrc:   PainSc: 0-No pain         Complications: No apparent anesthesia complications

## 2018-12-16 NOTE — Anesthesia Preprocedure Evaluation (Signed)
Anesthesia Evaluation  Patient identified by MRN, date of birth, ID band Patient awake    Reviewed: Allergy & Precautions, NPO status , Patient's Chart, lab work & pertinent test results  Airway Mallampati: II  TM Distance: >3 FB     Dental  (+) Dental Advisory Given   Pulmonary sleep apnea , former smoker,    breath sounds clear to auscultation       Cardiovascular hypertension, Pt. on medications + CAD and + Past MI  + dysrhythmias Atrial Fibrillation  Rhythm:Regular Rate:Normal     Neuro/Psych negative neurological ROS     GI/Hepatic Neg liver ROS, GERD  ,  Endo/Other  negative endocrine ROS  Renal/GU Renal disease     Musculoskeletal  (+) Arthritis ,   Abdominal   Peds  Hematology  (+) Blood dyscrasia (Elequis last dose evening 10/21), ,   Anesthesia Other Findings   Reproductive/Obstetrics                             Lab Results  Component Value Date   WBC 10.3 12/16/2018   HGB 12.5 (L) 12/16/2018   HCT 37.0 (L) 12/16/2018   MCV 96.9 12/16/2018   PLT 172 12/16/2018   Lab Results  Component Value Date   CREATININE 1.12 12/16/2018   BUN 38 (H) 12/16/2018   NA 140 12/16/2018   K 3.9 12/16/2018   CL 109 12/16/2018   CO2 22 12/16/2018    Anesthesia Physical Anesthesia Plan  ASA: III  Anesthesia Plan: General   Post-op Pain Management:    Induction: Intravenous  PONV Risk Score and Plan: 2 and Dexamethasone, Ondansetron and Treatment may vary due to age or medical condition  Airway Management Planned: Oral ETT  Additional Equipment:   Intra-op Plan:   Post-operative Plan: Extubation in OR  Informed Consent: I have reviewed the patients History and Physical, chart, labs and discussed the procedure including the risks, benefits and alternatives for the proposed anesthesia with the patient or authorized representative who has indicated his/her understanding and  acceptance.     Dental advisory given  Plan Discussed with: CRNA  Anesthesia Plan Comments:         Anesthesia Quick Evaluation

## 2018-12-16 NOTE — Anesthesia Procedure Notes (Signed)
Procedure Name: Intubation Date/Time: 12/16/2018 8:47 AM Performed by: Inda Coke, CRNA Pre-anesthesia Checklist: Patient identified, Emergency Drugs available, Suction available and Patient being monitored Patient Re-evaluated:Patient Re-evaluated prior to induction Oxygen Delivery Method: Circle System Utilized Preoxygenation: Pre-oxygenation with 100% oxygen Induction Type: IV induction Ventilation: Mask ventilation without difficulty Laryngoscope Size: Mac and 4 Grade View: Grade I Tube type: Oral Tube size: 7.5 mm Number of attempts: 1 Airway Equipment and Method: Stylet and Oral airway Placement Confirmation: ETT inserted through vocal cords under direct vision,  positive ETCO2 and breath sounds checked- equal and bilateral Secured at: 23 cm Tube secured with: Tape Dental Injury: Teeth and Oropharynx as per pre-operative assessment

## 2018-12-16 NOTE — Progress Notes (Signed)
PROGRESS NOTE    Chad Russell  [id number] DOB: 06-24-1926 DOA: 12/14/2018  PCP: [name], MD    LOS - 2   Brief Narrative:  85 y.o.malewith medical history significant of Parkinson's disease, complete heart block(x 20 years with no indication for pacemaker), CAD, atrial fibrillation on Xarelto, hypertension, OSA not on CPAP.  He presented on 10/22 with left hip pain status post a mechanical fall the night before.  He reported a slip in the foyer of his home when not using his walker. EMS was called the next morning because unable to bear weight or get out of bed.  Patient denies history of frequent falls. He denies any lightheadedness or dizziness prior to the fall, head injury or lossof consciousness, chest pain or shortness of breath. He is on Xarelto for his atrial fibrillation and last took this at 9 PM on 10/21.  Also reported pain with LUE movement and in the left lower ribs.  In the ED, patient afebrile, normotensive, bradycardic in the 40s but asymptomatic.  Labs notable for leukocytosis of 16.8, creatinine elevated at 1.42 (previously normal).  CT head showed no acute findings.  No rib fractures seen on rib or chest x-rays.  Hip and femur x-ray did show acute fracture of the left femoral neck.  Orthopedics was consulted and recommended admission to hospitalist service with hip fracture protocol and medical optimization for surgery.  Xarelto held.  Patient underwent left hip hemiarthroplasty on 10/24.    Subjective @TODAY @: Patient seen postoperatively on 5N and reported doing well with pain adequately controlled.  Surgery was uncomplicated.  Wife at bedside feeding him dinner.  He denies any acute issues including fever/chills, chest pain, shortness of breath.  Assessment & Plan:   Principal Problem:   Fracture of femoral neck, left (HCC) Active Problems:   Complete heart block (HCC)   HTN (hypertension)   Atrial fibrillation (HCC)   AKI (acute kidney injury)  (HCC)   BPH (benign prostatic hyperplasia)   Dementia due to Parkinson's disease without behavioral disturbance ([place])   Left femoral neck fracture s/p  Mechanical Fall - Ortho plans for surgery tomorrow morning (10/24) -Resume Xarelto and continue SCD's -Weightbearing as tolerated  -Pain control:    Norco every 6 hours PRN moderate pain   Morphine 0.5 mg IV every 2 hours PRN severe pain -Bowel regimen -PT/OT  Acute kidney injury -resolved. Suspect prerenal in the setting of dehydration.  Received 1 L NS in the ED. -continue holding lisinopril HCTZ for now as BP well controlled  -Avoid nephrotoxic agents and renally dose meds as indicated -BMP in the a.m.  Atrial fibrillation -chronic, rate controlled.  CHA2DS2-VASc 3.  Last Xarelto taken at 9 PM 10/21. - Hold Xarelto, resume postop  Essentialhypertension -chronic, currently with soft blood pressures - Hold lisinopril HCTZ   Asymptomatic bradycardia in the setting complete heart block -Stable -No pacemaker indication.  -Followed by cardiology outpatient  Parkinson's disease - Continue carbidopa levodopa  Left ear lesion -  -Wife reports he had basal cell skin carcinoma removed outpatient recently -Wound care per RN   DVT prophylaxis: SCDs Code Status:   Code Status: Full Code Family Communication: None at bedside Disposition Plan: Pending PT eval postoperatively, likely to rehab or home with home health PT in 1-2 days.   Consultants:   Ortho  Procedures:   None  Antimicrobials:   None   Objective: Vitals:   12/16/18 1125 12/16/18 1130 12/16/18 1145 12/16/18 1158  BP:  114/68 111/69 109/71  Pulse: (!) 35 (!) 36 (!) 39 (!) 38  Resp: 16 17 19    Temp:  (!) 97.5 F (36.4 C) (!) 97.5 F (36.4 C)   TempSrc:      SpO2: 97% 100% 99% 96%  Weight:      Height:        Intake/Output Summary (Last 24 hours) at 12/16/2018 1619 Last data filed at 12/16/2018 1103 Gross per 24 hour  Intake 2050  ml  Output 1000 ml  Net 1050 ml   Filed Weights   12/15/18 0324  Weight: 78.6 kg    Examination:  General exam: awake, alert, no acute distress, frail Respiratory system: clear to auscultation bilaterally, no wheezes, rales or rhonchi, normal respiratory effort. Cardiovascular system: normal S1/S2, bradycardic, regular rhythm, no JVD, murmurs, rubs, gallops, no pedal edema.   Gastrointestinal system: soft, non-tender, non-distended abdomen. Central nervous system: alert and oriented x4. no gross focal neurologic deficits, normal speech Extremities: moves all, normal tone, surgical site clean and without bleeding Skin: dry, intact, normal temperature Psychiatry: normal mood, congruent affect, judgement and insight appear normal    Data Reviewed: I have personally reviewed following labs and imaging studies  CBC: Recent Labs  Lab 12/14/18 1514 12/16/18 0421  WBC 16.8* 10.3  NEUTROABS 15.1*  --   HGB 13.9 12.5*  HCT 43.6 37.0*  MCV 100.5* 96.9  PLT 203 [id number]   Basic Metabolic Panel: Recent Labs  Lab 12/14/18 1514 12/16/18 0421  NA 139 140  K 4.4 3.9  CL 105 109  CO2 21* 22  GLUCOSE 152* 102*  BUN 42* 38*  CREATININE 1.42* 1.12  CALCIUM 9.5 9.0  MG  --  2.0   GFR: Estimated Creatinine Clearance: 46.7 mL/min (by C-G formula based on SCr of 1.12 mg/dL). Liver Function Tests: No results for input(s): AST, ALT, ALKPHOS, BILITOT, PROT, ALBUMIN in the last 168 hours. No results for input(s): LIPASE, AMYLASE in the last 168 hours. No results for input(s): AMMONIA in the last 168 hours. Coagulation Profile: Recent Labs  Lab 12/14/18 1656  INR 1.9*   Cardiac Enzymes: No results for input(s): CKTOTAL, CKMB, CKMBINDEX, TROPONINI in the last 168 hours. BNP (last 3 results) No results for input(s): PROBNP in the last 8760 hours. HbA1C: No results for input(s): HGBA1C in the last 72 hours. CBG: No results for input(s): GLUCAP in the last 168 hours. Lipid Profile:  No results for input(s): CHOL, HDL, LDLCALC, TRIG, CHOLHDL, LDLDIRECT in the last 72 hours. Thyroid Function Tests: No results for input(s): TSH, T4TOTAL, FREET4, T3FREE, THYROIDAB in the last 72 hours. Anemia Panel: No results for input(s): VITAMINB12, FOLATE, FERRITIN, TIBC, IRON, RETICCTPCT in the last 72 hours. Sepsis Labs: No results for input(s): PROCALCITON, LATICACIDVEN in the last 168 hours.  Recent Results (from the past 240 hour(s))  SARS CORONAVIRUS 2 (TAT 6-24 HRS) Nasopharyngeal Nasopharyngeal Swab     Status: None   Collection Time: 12/14/18  9:39 PM   Specimen: Nasopharyngeal Swab  Result Value Ref Range Status   SARS Coronavirus 2 NEGATIVE NEGATIVE Final    Comment: (NOTE) SARS-CoV-2 target nucleic acids are NOT DETECTED. The SARS-CoV-2 RNA is generally detectable in upper and lower respiratory specimens during the acute phase of infection. Negative results do not preclude SARS-CoV-2 infection, do not rule out co-infections with other pathogens, and should not be used as the sole basis for treatment or other patient management decisions. Negative results must be combined with clinical observations, patient  history, and epidemiological information. The expected result is Negative. Fact Sheet for Patients: SugarRoll.be Fact Sheet for Healthcare Providers: https://www.woods-mathews.com/ This test is not yet approved or cleared by the [place] FDA and  has been authorized for detection and/or diagnosis of SARS-CoV-2 by FDA under an Emergency Use Authorization (EUA). This EUA will remain  in effect (meaning this test can be used) for the duration of the COVID-19 declaration under Section 56 4(b)(1) of the Act, 21 U.S.C. section 360bbb-3(b)(1), unless the authorization is terminated or revoked sooner. Performed at [facility], [place] [address], [place], [place] [address]   Surgical PCR screen     Status: None    Collection Time: 12/15/18  2:04 AM   Specimen: Nasal Mucosa; Nasal Swab  Result Value Ref Range Status   MRSA, PCR NEGATIVE NEGATIVE Final   Staphylococcus aureus NEGATIVE NEGATIVE Final    Comment: (NOTE) The Xpert SA Assay (FDA approved for NASAL specimens in patients 56 years of age and older), is one component of a comprehensive surveillance program. It is not intended to diagnose infection nor to guide or monitor treatment. Performed at [facility], [place] [address] [place], [place] [address]          Radiology Studies: Ct Head Wo Contrast  Result Date: 12/14/2018 CLINICAL DATA:  Per ed notes: Pt assisted out of car onto stretcher, here with family who sts he fell last night, injuring L hip. Pt unable to bear weight. Pt taking Xarelto. EXAM: CT HEAD WITHOUT CONTRAST TECHNIQUE: Contiguous axial images were obtained from the base of the skull through the vertex without intravenous contrast. COMPARISON:  None. FINDINGS: Brain: There is central and cortical atrophy. Periventricular white matter changes are consistent with small vessel disease. No evidence of acute infarction, hemorrhage, hydrocephalus, extra-axial collection or mass lesion/mass effect. Vascular: No hyperdense vessel or unexpected calcification. Skull: Normal. Negative for fracture or focal lesion. Sinuses/Orbits: No acute finding. Other: None. IMPRESSION: 1. No evidence for acute intracranial abnormality. 2. Atrophy and small vessel disease. Electronically Signed   By: Nolon Nations M.D.   On: 12/14/2018 18:16   Dg Hip Unilat With Pelvis 2-3 Views Left  Result Date: 12/16/2018 CLINICAL DATA:  Left hip hemiarthroplasty. EXAM: DG HIP (WITH OR WITHOUT PELVIS) 2-3V LEFT COMPARISON:  Left hip x-rays dated December 14, 2018. FINDINGS: The left hip demonstrates a hemiarthroplasty without evidence of hardware failure or complication. There is expected intra-articular air. There is no fracture or dislocation. The  alignment is anatomic. Post-surgical changes noted in the surrounding soft tissues. Prior right hip hemiarthroplasty. IMPRESSION: Interval left hip hemiarthroplasty without acute postoperative complication. Electronically Signed   By: Titus Dubin M.D.   On: 12/16/2018 14:32        Scheduled Meds: . acetaminophen  500 mg Oral Q8H  . carbidopa-levodopa  1 tablet Oral TID  . docusate sodium  100 mg Oral BID  . mupirocin ointment  1 application Nasal BID  . pneumococcal 23 valent vaccine  0.5 mL Intramuscular Tomorrow-1000  . rivaroxaban  20 mg Oral Q supper   Continuous Infusions: . ceFAZolin    .  ceFAZolin (ANCEF) IV 2 g (12/16/18 1612)  . lactated ringers       LOS: 2 days    Time spent: 25-30 min    Ezekiel Slocumb, DO [facility] Pager: [phone]  If 7PM-7AM, please contact night-coverage [email or web address] Password [email or web address] 12/16/2018, 4:19 PM

## 2018-12-16 NOTE — Op Note (Signed)
PATIENT:  Chad Russell  27-Jan-1927  PRE-OPERATIVE DIAGNOSIS:  DISPLACED LEFT FEMORAL NECK FRACTURE  POST-OPERATIVE DIAGNOSIS:  DISPLACED LEFT FEMORAL NECK FRACTURE  PROCEDURE:  Procedure(s): UNIPOLAR HEMIARTHROPLASTY OF THE LEFT HIP with [place] Basic #7 femoral stem, standard neck, 56 mm head  SURGEON:  Surgeon(s) and Role:    Altamese Bruce[place]-Eddy, MD - Primary  PHYSICIAN ASSISTANT: Ainsley Spinner, PA-C  ANESTHESIA:   general  EBL:  200 mL   BLOOD ADMINISTERED:none  DRAINS: none   LOCAL MEDICATIONS USED:  NONE  SPECIMEN:  No Specimen  DISPOSITION OF SPECIMEN:  N/A  COUNTS:  YES  TOURNIQUET:  * No tourniquets in log *  DICTATION: Note written in EPIC  PLAN OF CARE: Admit to inpatient   PATIENT DISPOSITION:  PACU - hemodynamically stable.   Delay start of Pharmacological VTE agent (>24hrs) due to surgical blood loss or risk of bleeding: no  BRIEF SUMMARY OF INDICATION FOR PROCEDURE:  Chad Russell is a very pleasant 85 y.o.  who sustained a fall producing inability to bear weight, shortening, and external rotation of the extremity.  He was seen and evaluated with the recommendation for hemiarthroplasty. I discussed with the patient and wife the risks and benefits, inclding the potential for leg length inequality, dislocation or instability, arthritis, loss of motion, DVT, PE, heart attack, stroke, and death.  Consent was given to proceed.  BRIEF SUMMARY OF PROCEDURE:  The patient was taken to the operating room where general anesthesia was induced and after administration of preoperative antibiotics consisting of 2 g of Ancef. He was positioned with the left side up and all prominences were padded appropriately.  We made a 10 cm incision after the time-out, carrying dissection down to the IT band, was split in line with the skin.  Cerebellar retractor was placed and we were able to then flex and internally rotate the hip releasing the piriformis and short  rotators at their insertions.  The capsule was then T'd, tagging the corners with #1 Vicryl.  The neck cut was refined using a cutting guide and then this was followed by removal of the head, which sized perfectly to 56 mm. Acetabular trials were placed, confirming this size as the best fit. Mueller and Cobra retractors were placed along the proximal femur, which was then prepared with the canal finder, then lateralizer, followed by reamers up to 7 and the broaches, achieving  outstanding fit and fill with the 7 broach.  The calcar reamer was used to refine the cut as we were using a low demand stem.  The canal was irrigated thoroughly and the acetabulum once again searched multiple times for fragments and irrigated thoroughly.  Trial components were placed and the patient had outstanding stability in combine 90 degrees of flexion, adduction, and internal rotation as well as in external rotation and extension.  Consequently, actual components were placed.  My assistant Ainsley Spinner, was necessary for delivery and control of the proximal femur during preparation, also during relocation and dislocation of the trial components as well as relocation of the actual components.  He assisted me with wound closure as well.  I did repair the capsule with #1 Vicryl and then used #2 FiberWire through bone tunnels to repair the short rotators and piriformis.  This was followed by a #1 Vicryl for the IT band and lastly 2-0 Vicryl and nylon for the subcutaneous and skin.  Sterile gently compressive dressing was applied.  The patient was awakened from anesthesia  and transported to the PACU in stable condition.  PROGNOSIS:  The patient will be weightbearing as tolerated with posterior hip precautions.  Patient has an elevated risk of complications related to declining overall health and mobility.  Chad Russell remains on the Medical Service and will be on DVT prophylaxis mechanically and Return to Xarelto  for long-term prophylaxis.     Chad Russell, M.D.

## 2018-12-17 LAB — BASIC METABOLIC PANEL
Anion gap: 9 (ref 5–15)
BUN: 35 mg/dL — ABNORMAL HIGH (ref 8–23)
CO2: 23 mmol/L (ref 22–32)
Calcium: 8.6 mg/dL — ABNORMAL LOW (ref 8.9–10.3)
Chloride: 108 mmol/L (ref 98–111)
Creatinine, Ser: 1.04 mg/dL (ref 0.61–1.24)
GFR calc Af Amer: >60 mL/min (ref >60)
GFR calc non Af Amer: >60 mL/min (ref >60)
Glucose, Bld: 149 mg/dL — ABNORMAL HIGH (ref 70–99)
Potassium: 4.1 mmol/L (ref 3.5–5.1)
Sodium: 140 mmol/L (ref 135–145)

## 2018-12-17 LAB — CBC
HCT: 35.4 % — ABNORMAL LOW (ref 39.0–52.0)
Hemoglobin: 11.8 g/dL — ABNORMAL LOW (ref 13.0–17.0)
MCH: 32.8 pg (ref 26.0–34.0)
MCHC: 33.3 g/dL (ref 30.0–36.0)
MCV: 98.3 fL (ref 80.0–100.0)
Platelets: 181 10*3/uL (ref 150–400)
RBC: 3.6 MIL/uL — ABNORMAL LOW (ref 4.22–5.81)
RDW: 14.3 % (ref 11.5–15.5)
WBC: 11 10*3/uL — ABNORMAL HIGH (ref 4.0–10.5)
nRBC: 0 % (ref 0.0–0.2)

## 2018-12-17 LAB — MAGNESIUM: Magnesium: 2 mg/dL (ref 1.7–2.4)

## 2018-12-17 MED ORDER — SODIUM CHLORIDE 0.9 % IV SOLN
INTRAVENOUS | Status: DC
  Administered 2018-12-17 – 2018-12-19 (×2): via INTRAVENOUS

## 2018-12-17 NOTE — Plan of Care (Signed)

## 2018-12-17 NOTE — Evaluation (Signed)
Physical Therapy Evaluation Patient Details Name: Chad Russell MRN: [id number] DOB: 1926/05/11 Today's Date: 12/17/2018   History of Present Illness  85 y.o. male with medical history significant of Parkinson's disease, complete heart block (x 20 years with no indication for pacemaker), CAD, atrial fibrillation on Xarelto, hypertension, OSA not on CPAP.  He presented on 10/22 with left hip pain status post a mechanical fall the night before.  He reported a slip in the foyer of his home when not using his walker, resulting in left femoral neck fracture. Now, s/p L hip hemiarthroplasty, posterior hip precautions, WBAT  Clinical Impression   Patient is s/p above surgery resulting in functional limitations due to the deficits listed below (see PT Problem List). Managing independently with RW for amb prior to admission; Presents to PT with decr strength, decr knowledge of post hip prec, functional dependencies; He participates well, has assist at home, and, given he is a nonogenarian, that can fit the medical complexity piece; Pt and wife are well aware of the need for post-acute rehabilitation -- they very strongly want to go to inpt rehab here at [facility]; Will ask Rehab Admissions Coordinator to weigh in;  Patient will benefit from skilled PT to increase their independence and safety with mobility to allow discharge to the venue listed below.       Follow Up Recommendations CIR;Supervision/Assistance - 24 hour    Equipment Recommendations  None recommended by PT    Recommendations for Other Services OT consult(as ordered)     Precautions / Restrictions Precautions Precautions: Fall;Posterior Hip Precaution Comments: Educated pt and wife on posterior hip prec; Plan to bring handouts to post next session Restrictions LLE Weight Bearing: Weight bearing as tolerated      Mobility  Bed Mobility Overal bed mobility: Needs Assistance Bed Mobility: Supine to Sit;Sit to Supine     Supine to  sit: Max assist;+2 for physical assistance Sit to supine: Total assist;+2 for physical assistance   General bed mobility comments: Cues for technqiue, and use of bed pad to incrementally move hips to R side of bed to get up; Max assist to elevate trunk to sit, tending to have significant posterior lean, requing close guard to prevent forward slide; max assist to support posteriorly  Transfers Overall transfer level: Needs assistance Equipment used: Rolling walker (2 wheeled) Transfers: Sit to/from Stand Sit to Stand: Max assist;+2 physical assistance;+2 safety/equipment         General transfer comment: Attempted sit to stand form elevated bed with +2 max assist and bilateral support; Able to briefly clear hips, but unable to come to fully standing  Ambulation/Gait                Stairs            Wheelchair Mobility    Modified Rankin (Stroke Patients Only)       Balance Overall balance assessment: Needs assistance;History of Falls   Sitting balance-Leahy Scale: Poor Sitting balance - Comments: Sat EOB 8-10 minutes, needing mod to max assist support posteriorly due to significant posterior lean and posterior pelvic tile; worked on anterior pelvic tilt and anterior weight shifting (within Post Hip Prec) in prep for transfer to stand Postural control: Posterior lean                                   Pertinent Vitals/Pain Pain Assessment: Faces Faces Pain Scale: Hurts even  more Pain Location: L hip with movement Pain Descriptors / Indicators: Grimacing;Guarding Pain Intervention(s): Monitored during session;Premedicated before session;Repositioned    Home Living Family/patient expects to be discharged to:: Private residence Living Arrangements: Spouse/significant other;Children Available Help at Discharge: Family;Available 24 hours/day Type of Home: House Home Access: Stairs to enter   [employer] of Steps: 2 Home Layout: Two  level;Able to live on main level with bedroom/bathroom Home Equipment: Walker - 4 wheels;Walker - 2 wheels;Cane - single point      Prior Function Level of Independence: Needs assistance   Gait / Transfers Assistance Needed: walks with RW  ADL's / Homemaking Assistance Needed: wife assists        Hand Dominance   Dominant Hand: Right    Extremity/Trunk Assessment   Upper Extremity Assessment Upper Extremity Assessment: Defer to OT evaluation;Generalized weakness    Lower Extremity Assessment Lower Extremity Assessment: Generalized weakness;LLE deficits/detail LLE Deficits / Details: Grossly decr AROM and strength, limited by pain ost fracture and surgery    Cervical / Trunk Assessment Cervical / Trunk Assessment: Other exceptions Cervical / Trunk Exceptions: very stiff, upper and lower trunk flexed, sitting EOB  Communication   Communication: No difficulties  Cognition Arousal/Alertness: Awake/alert Behavior During Therapy: WFL for tasks assessed/performed Overall Cognitive Status: Within Functional Limits for tasks assessed                                        General Comments General comments (skin integrity, edema, etc.): VSS on supplemental O2    Exercises     Assessment/Plan    PT Assessment Patient needs continued PT services  PT Problem List Decreased strength;Decreased range of motion;Decreased activity tolerance;Decreased balance;Decreased mobility;Decreased coordination;Decreased knowledge of use of DME;Decreased safety awareness;Decreased knowledge of precautions;Pain       PT Treatment Interventions DME instruction;Gait training;Functional mobility training;Therapeutic activities;Therapeutic exercise;Balance training;Neuromuscular re-education;Cognitive remediation;Patient/family education    PT Goals (Current goals can be found in the Care Plan section)  Acute Rehab PT Goals Patient Stated Goal: go to rehab to regain strength, and  get home PT Goal Formulation: With patient Time For Goal Achievement: 12/31/18 Potential to Achieve Goals: Fair    Frequency Min 5X/week   Barriers to discharge        Co-evaluation               AM-PAC PT "6 Clicks" Mobility  Outcome Measure Help needed turning from your back to your side while in a flat bed without using bedrails?: A Lot Help needed moving from lying on your back to sitting on the side of a flat bed without using bedrails?: A Lot Help needed moving to and from a bed to a chair (including a wheelchair)?: Total Help needed standing up from a chair using your arms (e.g., wheelchair or bedside chair)?: Total Help needed to walk in hospital room?: Total Help needed climbing 3-5 steps with a railing? : Total 6 Click Score: 8    End of Session Equipment Utilized During Treatment: Gait belt;Left knee immobilizer(for post hip prec) Activity Tolerance: Patient tolerated treatment well Patient left: in bed;with call bell/phone within reach;with bed alarm set;with family/visitor present(bed in semi-chair position) Nurse Communication: Mobility status PT Visit Diagnosis: Unsteadiness on feet (R26.81);Other abnormalities of gait and mobility (R26.89);Repeated falls (R29.6);Muscle weakness (generalized) (M62.81);History of falling (Z91.81)    Time: 1100-1153(minus approx 10 minutes for student activities) PT Time  Calculation (min) (ACUTE ONLY): 53 min   Charges:   PT Evaluation $PT Eval Moderate Complexity: 1 Mod PT Treatments $Therapeutic Activity: 23-37 mins        Roney Marion, PT  Acute Rehabilitation Services Pager [phone] Office [place] 12/17/2018, 6:32 PM

## 2018-12-17 NOTE — Progress Notes (Signed)
PROGRESS NOTE    Chad Russell  [id number] DOB: [birth year] DOA: 12/14/2018  PCP: [name], MD    LOS - 3   Brief Narrative:  85 y.o.malewith medical history significant of Parkinson's disease, complete heart block(x20 years with no indication for pacemaker), CAD, atrial fibrillation on Xarelto, hypertension, OSA not on CPAP.He presented on 10/22 withleft hip pain status post amechanicalfall the night before.  He reported a slip in the foyer of his home when not using his walker.EMS was called the next morning because unable to bear weight or get out of bed.  Patient denies history of frequent falls. He denies any lightheadedness or dizziness prior to the fall, head injury or lossof consciousness, chest pain or shortness of breath. He is on Xarelto for his atrial fibrillation and last took this at [facility] 10/21.Also reported pain with LUE movement and in the left lower ribs.  In the ED, patient afebrile, normotensive, bradycardic in the 40s but asymptomatic. Labs notable for leukocytosis of 16.8, creatinine elevated at 1.42 (previously normal). CT head showed no acute findings. No rib fractures seen on rib or chest x-rays. Hip and femur x-ray did show acute fracture of the left femoral neck. Orthopedics was consulted and recommended admission to hospitalist service with hip fracture protocol and medical optimization for surgery. Xarelto held. Patient underwent left hip hemiarthroplasty on 10/24.    Subjective @TODAY @: Patient awake sitting up in bed, wife at bedside.  Just wrapping up with OT.  Reports PT was in earlier, patient reports unable to bear weight on standing.  Currently, reports 2 out of 10 pain in the left hip but was 6-7 out of 10 upon standing.  Reports no appetite, wife concerned patient not eating or drinking very much.  Patient denies fevers or chills, chest pain, shortness of breath, nausea vomiting or diarrhea.  Assessment & Plan:    Principal Problem:   Fracture of femoral neck, left (HCC) Active Problems:   Complete heart block (HCC)   HTN (hypertension)   Atrial fibrillation (HCC)   AKI (acute kidney injury) (HCC)   BPH (benign prostatic hyperplasia)   Dementia due to Parkinson's disease without behavioral disturbance (HCC)   Left femoral neck fractures/p  MechanicalFall-Ortho plans for surgery tomorrow morning (10/24) -Resume Xarelto and continue SCD's -Weightbearing as tolerated  -Posterior hip precautions -Ice -Dressing changes to start tomorrow -Pain control:  Norco every 6 hours PRNmoderate pain Morphine 0.5 mg IV every 2 hoursPRNsevere pain -Bowel regimen -PT/OT  Acute blood loss anemia-appropriate for postop.  Hemoglobin today 11.8 from 12.5 preop yesterday.   -CBC in a.m.  Acute kidney injury-resolved. Suspect prerenal in the setting of dehydration. Received 1 L NS in the ED. -continue holding lisinopril HCTZ for now as BP well controlled  -Avoid nephrotoxic agents and renally dose meds as indicated -BMP in the a.m.  Atrial fibrillation-chronic, rate controlled. CHA2DS2-VASc 3. Xarelto held for surgery, resumed postop.  Essentialhypertension-chronic, currently with soft blood pressures -Hold lisinopril HCTZ   Asymptomatic bradycardia in the setting complete heart block -Stable -No pacemaker indication. -Followed by cardiology outpatient  Parkinson's disease -Continue carbidopa levodopa  Left ear lesion- -Wife reports he had basal cell skin carcinoma removed outpatient recently -Wound care per RN   DVT prophylaxis:SCDs Code Status:Code Status: Full Code Family Communication:None at bedside Disposition Plan:Likely to need SNF for rehab, wife does not want.  Continue PT discharge planning.  Medically stable for discharge.  Consultants:  Ortho  Procedures:  None  Antimicrobials:  None   Objective: Vitals:   12/16/18 1947  12/17/18 0254 12/17/18 0741 12/17/18 1349  BP: 118/76 125/70 98/64 103/69  Pulse: (!) 38 (!) 38 (!) 39 (!) 36  Resp:      Temp: 98.7 F (37.1 C) 98.2 F (36.8 C) 98.4 F (36.9 C) 98.3 F (36.8 C)  TempSrc: Oral Oral Oral Oral  SpO2: 100% 98% 100% 97%  Weight:      Height:        Intake/Output Summary (Last 24 hours) at 12/17/2018 1658 Last data filed at 12/17/2018 1300 Gross per 24 hour  Intake 228 ml  Output 400 ml  Net -172 ml   Filed Weights   12/15/18 0324  Weight: 78.6 kg    Examination:  General exam: awake, alert, no acute distress, frail Respiratory system: clear to auscultation bilaterally, no wheezes, rales or rhonchi, normal respiratory effort. Cardiovascular system: normal S1/S2, bradycardic, regular rhythm, no JVD, murmurs, rubs, gallops, no pedal edema.   Gastrointestinal system: soft, non-tender, non-distended abdomen, normal bowel sounds. Central nervous system: alert and oriented x4. no gross focal neurologic deficits, normal speech Extremities: Left lower extremity with dressing clean dry and intact, distal motor and sensation intact Skin: dry, intact, normal temperature Psychiatry: normal mood, flat/Parkinson affect, judgement and insight appear normal    Data Reviewed: I have personally reviewed following labs and imaging studies  CBC: Recent Labs  Lab 12/14/18 1514 12/16/18 0421 12/17/18 0504  WBC 16.8* 10.3 11.0*  NEUTROABS 15.1*  --   --   HGB 13.9 12.5* 11.8*  HCT 43.6 37.0* 35.4*  MCV 100.5* 96.9 98.3  PLT 203 172 0000000   Basic Metabolic Panel: Recent Labs  Lab 12/14/18 1514 12/16/18 0421 12/17/18 0504  NA 139 140 140  K 4.4 3.9 4.1  CL 105 109 108  CO2 21* 22 23  GLUCOSE 152* 102* 149*  BUN 42* 38* 35*  CREATININE 1.42* 1.12 1.04  CALCIUM 9.5 9.0 8.6*  MG  --  2.0 2.0   GFR: Estimated Creatinine Clearance: 50.3 mL/min (by C-G formula based on SCr of 1.04 mg/dL). Liver Function Tests: No results for input(s): AST, ALT,  ALKPHOS, BILITOT, PROT, ALBUMIN in the last 168 hours. No results for input(s): LIPASE, AMYLASE in the last 168 hours. No results for input(s): AMMONIA in the last 168 hours. Coagulation Profile: Recent Labs  Lab 12/14/18 1656  INR 1.9*   Cardiac Enzymes: No results for input(s): CKTOTAL, CKMB, CKMBINDEX, TROPONINI in the last 168 hours. BNP (last 3 results) No results for input(s): PROBNP in the last 8760 hours. HbA1C: No results for input(s): HGBA1C in the last 72 hours. CBG: No results for input(s): GLUCAP in the last 168 hours. Lipid Profile: No results for input(s): CHOL, HDL, LDLCALC, TRIG, CHOLHDL, LDLDIRECT in the last 72 hours. Thyroid Function Tests: No results for input(s): TSH, T4TOTAL, FREET4, T3FREE, THYROIDAB in the last 72 hours. Anemia Panel: No results for input(s): VITAMINB12, FOLATE, FERRITIN, TIBC, IRON, RETICCTPCT in the last 72 hours. Sepsis Labs: No results for input(s): PROCALCITON, LATICACIDVEN in the last 168 hours.  Recent Results (from the past 240 hour(s))  SARS CORONAVIRUS 2 (TAT 6-24 HRS) Nasopharyngeal Nasopharyngeal Swab     Status: None   Collection Time: 12/14/18  9:39 PM   Specimen: Nasopharyngeal Swab  Result Value Ref Range Status   SARS Coronavirus 2 NEGATIVE NEGATIVE Final    Comment: (NOTE) SARS-CoV-2 target nucleic acids are NOT DETECTED. The SARS-CoV-2 RNA is generally detectable in upper  and lower respiratory specimens during the acute phase of infection. Negative results do not preclude SARS-CoV-2 infection, do not rule out co-infections with other pathogens, and should not be used as the sole basis for treatment or other patient management decisions. Negative results must be combined with clinical observations, patient history, and epidemiological information. The expected result is Negative. Fact Sheet for Patients: SugarRoll.be Fact Sheet for Healthcare Providers:  https://www.woods-mathews.com/ This test is not yet approved or cleared by the [place] FDA and  has been authorized for detection and/or diagnosis of SARS-CoV-2 by FDA under an Emergency Use Authorization (EUA). This EUA will remain  in effect (meaning this test can be used) for the duration of the COVID-19 declaration under Section 56 4(b)(1) of the Act, 21 U.S.C. section 360bbb-3(b)(1), unless the authorization is terminated or revoked sooner. Performed at [facility], [place] [address], [place],  [address]   Surgical PCR screen     Status: None   Collection Time: 12/15/18  2:04 AM   Specimen: Nasal Mucosa; Nasal Swab  Result Value Ref Range Status   MRSA, PCR NEGATIVE NEGATIVE Final   Staphylococcus aureus NEGATIVE NEGATIVE Final    Comment: (NOTE) The Xpert SA Assay (FDA approved for NASAL specimens in patients 8 years of age and older), is one component of a comprehensive surveillance program. It is not intended to diagnose infection nor to guide or monitor treatment. Performed at [facility], [place] [address], [place],  [address]          Radiology Studies: Dg Hip Unilat With Pelvis 2-3 Views Left  Result Date: 12/16/2018 CLINICAL DATA:  Left hip hemiarthroplasty. EXAM: DG HIP (WITH OR WITHOUT PELVIS) 2-3V LEFT COMPARISON:  Left hip x-rays dated December 14, 2018. FINDINGS: The left hip demonstrates a hemiarthroplasty without evidence of hardware failure or complication. There is expected intra-articular air. There is no fracture or dislocation. The alignment is anatomic. Post-surgical changes noted in the surrounding soft tissues. Prior right hip hemiarthroplasty. IMPRESSION: Interval left hip hemiarthroplasty without acute postoperative complication. Electronically Signed   By: Titus Dubin M.D.   On: 12/16/2018 14:32        Scheduled Meds: . acetaminophen  500 mg Oral Q8H  . carbidopa-levodopa  1 tablet Oral TID  .  docusate sodium  100 mg Oral BID  . mupirocin ointment  1 application Nasal BID  . pneumococcal 23 valent vaccine  0.5 mL Intramuscular Tomorrow-1000  . rivaroxaban  20 mg Oral Q supper   Continuous Infusions: . lactated ringers       LOS: 3 days    Time spent: 30-35 min    Ezekiel Slocumb, DO [facility] Pager: [phone]  If 7PM-7AM, please contact night-coverage [email or web address] Password [facility] 12/17/2018, 4:58 PM

## 2018-12-17 NOTE — Progress Notes (Signed)
Orthopaedic Trauma Service Progress Note  Patient ID: PONO MONTAN MRN: [id number] DOB/AGE: 1927/01/02 85 y.o.  Subjective:  Doing very well, about to work with therapies.  No specific complaints   ROS As above Objective:   VITALS:   Vitals:   12/16/18 1759 12/16/18 1947 12/17/18 0254 12/17/18 0741  BP: 129/69 118/76 125/70 98/64  Pulse: (!) 37 (!) 38 (!) 38 (!) 39  Resp:      Temp: (!) 97.5 F (36.4 C) 98.7 F (37.1 C) 98.2 F (36.8 C) 98.4 F (36.9 C)  TempSrc: Oral Oral Oral Oral  SpO2: 100% 100% 98% 100%  Weight:      Height:        Estimated body mass index is 24.18 kg/m as calculated from the following:   Height as of this encounter: 5\' 11"  (1.803 m).   Weight as of this encounter: 78.6 kg.   Intake/Output      10/24 0701 - 10/25 0700 10/25 0701 - 10/26 0700   P.O. 114    I.V. (mL/kg) 1800 (22.9)    IV Piggyback 250    Total Intake(mL/kg) 2164 (27.5)    Urine (mL/kg/hr) 650 (0.3)    Stool 0    Blood 350    Total Output 1000    Net +1164         Stool Occurrence 1 x 1 x     LABS  Results for orders placed or performed during the hospital encounter of 12/14/18 (from the past 24 hour(s))  CBC     Status: Abnormal   Collection Time: 12/17/18  5:04 AM  Result Value Ref Range   WBC 11.0 (H) 4.0 - 10.5 K/uL   RBC 3.60 (L) 4.22 - 5.81 MIL/uL   Hemoglobin 11.8 (L) 13.0 - 17.0 g/dL   HCT 35.4 (L) 39.0 - 52.0 %   MCV 98.3 80.0 - 100.0 fL   MCH 32.8 26.0 - 34.0 pg   MCHC 33.3 30.0 - 36.0 g/dL   RDW 14.3 11.5 - 15.5 %   Platelets 181 150 - 400 K/uL   nRBC 0.0 0.0 - 0.2 %  Basic metabolic panel     Status: Abnormal   Collection Time: 12/17/18  5:04 AM  Result Value Ref Range   Sodium 140 135 - 145 mmol/L   Potassium 4.1 3.5 - 5.1 mmol/L   Chloride 108 98 - 111 mmol/L   CO2 23 22 - 32 mmol/L   Glucose, Bld 149 (H) 70 - 99 mg/dL   BUN 35 (H) 8 - 23 mg/dL   Creatinine, Ser  1.04 0.61 - 1.24 mg/dL   Calcium 8.6 (L) 8.9 - 10.3 mg/dL   GFR calc non Af Amer >60 >60 mL/min   GFR calc Af Amer >60 >60 mL/min   Anion gap 9 5 - 15  Magnesium     Status: None   Collection Time: 12/17/18  5:04 AM  Result Value Ref Range   Magnesium 2.0 1.7 - 2.4 mg/dL    Results for CORTAVIOUS, AGREDA (MRN [id number]) as of 12/17/2018 15:14  Ref. Range 12/16/2018 04:21  Vitamin D, 25-Hydroxy Latest Ref Range: 30 - 100 ng/mL 60.24   PHYSICAL EXAM:   Gen: Sitting up in bed, no acute distress, appears well, very pleasant Lungs: Unlabored Cardiac: Bradycardic Abd:  Soft, nontender Ext:       Left lower extremity  Dressing is clean, dry and intact  Minimal swelling  Extremity is warm  + DP pulse  No DCT  Compartments are soft and nontender  Distal motor and sensory functions are intact   Assessment/Plan: 1 Day Post-Op   Principal Problem:   Fracture of femoral neck, left (HCC) Active Problems:   Complete heart block (HCC)   HTN (hypertension)   Atrial fibrillation (HCC)   BPH (benign prostatic hyperplasia)   AKI (acute kidney injury) ([place])   Dementia due to Parkinson's disease without behavioral disturbance (HCC)   Anti-infectives (From admission, onward)   Start     Dose/Rate Route Frequency Ordered Stop   12/16/18 1400  ceFAZolin (ANCEF) IVPB 2g/100 mL premix    Note to Pharmacy: Tolerated ancef in OR   2 g 200 mL/hr over 30 Minutes Intravenous Every 6 hours 12/16/18 1201 12/16/18 2027   12/16/18 0802  ceFAZolin (ANCEF) 2-4 GM/100ML-% IVPB    Note to Pharmacy: Rejeana Brock   : cabinet override      12/16/18 0802 12/16/18 2014    .  POD/HD#: 1    85 y/o male s/p fall with L femoral neck fracture   -displaced L femoral neck fracture s/p left hip hemiarthroplasty  Weight-bear as tolerated  Posterior hip precautions  PT and OT  Ice  Dressing changes as needed starting tomorrow    - Pain management:             Continue with current regimen   - ABL  anemia/Hemodynamics             Cbc in am  Stable   - Medical issues              Per primary    - DVT/PE prophylaxis:             On Xarelto chronically for A. Fib  Restarted on postop day 1   - ID:              periop abx    - Metabolic Bone Disease:             Vitamin D levels look outstanding   Continue with home regimen              2nd hip fracture in a year and half             Suggestive of very poor bone quality      - FEN/GI prophylaxis/Foley/Lines:             Diet as tolerated    - Dispo:             Therapies  Inpatient rehab consult   Wife does not want a SNF.  If unable to get into inpatient rehab she will take him home however she is the only one at home to assist and I do not think she is capable of managing patient on      Leone Payor [phone] (C) 12/17/2018, 10:54 AM  [facility] [place] [id number] [phone] (F)

## 2018-12-18 ENCOUNTER — Encounter (HOSPITAL_COMMUNITY): Payer: Self-pay | Admitting: *Deleted

## 2018-12-18 LAB — BASIC METABOLIC PANEL
Anion gap: 7 (ref 5–15)
BUN: 30 mg/dL — ABNORMAL HIGH (ref 8–23)
CO2: 25 mmol/L (ref 22–32)
Calcium: 8.5 mg/dL — ABNORMAL LOW (ref 8.9–10.3)
Chloride: 109 mmol/L (ref 98–111)
Creatinine, Ser: 0.99 mg/dL (ref 0.61–1.24)
GFR calc Af Amer: >60 mL/min (ref >60)
GFR calc non Af Amer: >60 mL/min (ref >60)
Glucose, Bld: 104 mg/dL — ABNORMAL HIGH (ref 70–99)
Potassium: 4.1 mmol/L (ref 3.5–5.1)
Sodium: 141 mmol/L (ref 135–145)

## 2018-12-18 LAB — CBC
HCT: 32.7 % — ABNORMAL LOW (ref 39.0–52.0)
Hemoglobin: 10.6 g/dL — ABNORMAL LOW (ref 13.0–17.0)
MCH: 32.4 pg (ref 26.0–34.0)
MCHC: 32.4 g/dL (ref 30.0–36.0)
MCV: 100 fL (ref 80.0–100.0)
Platelets: 164 10*3/uL (ref 150–400)
RBC: 3.27 MIL/uL — ABNORMAL LOW (ref 4.22–5.81)
RDW: 14.1 % (ref 11.5–15.5)
WBC: 10.5 10*3/uL (ref 4.0–10.5)
nRBC: 0 % (ref 0.0–0.2)

## 2018-12-18 LAB — CALCITRIOL (1,25 DI-OH VIT D): Vit D, 1,25-Dihydroxy: 39.7 pg/mL (ref 19.9–79.3)

## 2018-12-18 NOTE — Care Management (Signed)
CM consult acknowledged to assist with any HH/DME needs. Awaiting PT/OT eval for DCP recommendations and will continue to follow.  Tranesha Lessner RN, BSN, NCM-BC, ACM-RN [phone] 

## 2018-12-18 NOTE — Care Management Important Message (Signed)
Important Message  Patient Details  Name: Chad Russell MRN: [id number] Date of Birth: May 20, 1926   Medicare Important Message Given:  Yes     Cinthia Rodden [address] 12/18/2018, 3:31 PM

## 2018-12-18 NOTE — Progress Notes (Addendum)
PROGRESS NOTE    Chad Russell  [id number] DOB: [birth year] DOA: 12/14/2018  PCP: [name], MD    LOS - 4   Brief Narrative:  85 y.o.malewith medical history significant of Parkinson's disease, complete heart block(x20 years with no indication for pacemaker), CAD, atrial fibrillation on Xarelto, hypertension, OSA not on CPAP.He presented on 10/22 withleft hip pain status post amechanicalfall the night before.He reported a slip in the foyer of his home when not using his walker.EMS was called the next morning becauseunable to bear weight or get out of bed.Patient denies history of frequent falls. He denies any lightheadedness or dizziness prior to the fall, head injury orlossof consciousness,chest pain or shortness of breath. He is on Xarelto for his atrial fibrillation and last took this at [facility] 10/21.Also reported pain with LUE movement and in the left lower ribs.In the ED, patient afebrile, normotensive, bradycardic in the 40s but asymptomatic. Labs notable for leukocytosis of 16.8, creatinine elevated at 1.42 (previously normal). CT head showed no acute findings. No rib fractures seen on rib or chest x-rays. Hip and femur x-ray did show acute fracture of the left femoral neck. Orthopedics was consulted and recommended admission to hospitalist service with hip fracture protocol and medical optimization for surgery. Xarelto held perioperatively.Patient underwentleft hip hemiarthroplasty on 10/24.Patient evaluated by PT/OT who recommended comprehensive inpatient rehab upon discharge due to greatly decreased functional mobility and activity tolerance.  Subjective 10/26: Patient seen awake, sitting up in bed just after breakfast.  He was sipping water from a straw was witnessed to aspirate.  He appeared unable to breathe or swallow for a few seconds, then was able to cough and recovered well.  He reports pain to abdomen.  RN reported 2 bowel  movements this morning.  Patient asked about the brace on his left leg states it bothers him.  PT in room to begin eval.  Assessment & Plan:   Principal Problem:   Fracture of femoral neck, left (HCC) Active Problems:   Complete heart block (HCC)   HTN (hypertension)   Atrial fibrillation (HCC)   AKI (acute kidney injury) (HCC)   BPH (benign prostatic hyperplasia)   Dementia due to Parkinson's disease without behavioral disturbance (HCC)   Left femoral neck fractures/p  MechanicalFall-Ortho plans for surgery tomorrow morning (10/24) -ResumeXarelto and continue SCD's -Weightbearing as tolerated  -Posterior hip precautions -Ice -Dressing changes as needed -Pain control:     Scheduled Tylenol  Norco every 6 hours PRNmoderate pain -Bowel regimen -PT/OT -recommend comprehensive inpatient rehab  Concern for dysphagia -witnessed aspiration event at bedside this morning, patient sipping water from straw. -Speech therapy for swallow eval -Modified barium ordered  Acute blood loss anemia-appropriate for postop.  No evidence of ongoing bleeding. Hemoglobin trend 10.6 from 11.8, from 12.5 preop.   -CBC in a.m.  Acute kidney injury-resolved. Suspect prerenal in the setting of dehydration. Received 1 L NS in the ED. -continue holdinglisinopril HCTZ for nowas BP well controlled -Avoid nephrotoxic agents and renally dose meds as indicated -BMP in the a.m.  Atrial fibrillation-chronic, rate controlled. CHA2DS2-VASc 3. Xarelto held for surgery, resumed postop.  Essentialhypertension-chronic, currently with soft blood pressures -Hold lisinopril HCTZ   Asymptomatic bradycardia in the setting complete heart block -Stable -No pacemaker indication. -Followed by cardiology outpatient  Parkinson's disease -Continue carbidopa levodopa  Left ear lesion- -Wife reports he had basal cell skin carcinoma removed outpatient recently -Wound care per RN   DVT  prophylaxis:SCDs Code Status:Code Status: Full Code  Family Communication:None at bedside Disposition Plan:Likely to need SNF for rehab, wife does not want.  Continue PT discharge planning.  Medically stable for discharge.  Consultants:  Ortho  Procedures:  None  Antimicrobials:  None   Objective: Vitals:   12/17/18 0741 12/17/18 1349 12/17/18 1935 12/18/18 0316  BP: 98/64 103/69 105/75 120/66  Pulse: (!) 39 (!) 36 (!) 36 (!) 37  Resp:      Temp: 98.4 F (36.9 C) 98.3 F (36.8 C) 98.4 F (36.9 C) 97.8 F (36.6 C)  TempSrc: Oral Oral Oral Oral  SpO2: 100% 97% 99% 100%  Weight:      Height:        Intake/Output Summary (Last 24 hours) at 12/18/2018 0718 Last data filed at 12/17/2018 1300 Gross per 24 hour  Intake 114 ml  Output -  Net 114 ml   Filed Weights   12/15/18 0324  Weight: 78.6 kg    Examination:  General exam: awake, alert, no acute distress Respiratory system: clear to auscultation bilaterally, no wheezes, rales or rhonchi, normal respiratory effort. Cardiovascular system: normal S1/S2, RRR, no JVD, murmurs, rubs, gallops, no pedal edema.   Gastrointestinal system: soft, non-tender, non-distended abdomen, no organomegaly or masses felt, normal bowel sounds. Central nervous system: alert and oriented x4. no gross focal neurologic deficits, delayed but normal speech Extremities: Brace on left lower extremity, moves all, normal tone Psychiatry: normal mood, congruent affect, judgement and insight appear normal    Data Reviewed: I have personally reviewed following labs and imaging studies  CBC: Recent Labs  Lab 12/14/18 1514 12/16/18 0421 12/17/18 0504 12/18/18 0405  WBC 16.8* 10.3 11.0* 10.5  NEUTROABS 15.1*  --   --   --   HGB 13.9 12.5* 11.8* 10.6*  HCT 43.6 37.0* 35.4* 32.7*  MCV 100.5* 96.9 98.3 100.0  PLT 203 172 181 [id number]   Basic Metabolic Panel: Recent Labs  Lab 12/14/18 1514 12/16/18 0421 12/17/18 0504 12/18/18  0405  NA 139 140 140 141  K 4.4 3.9 4.1 4.1  CL 105 109 108 109  CO2 21* 22 23 25   GLUCOSE 152* 102* 149* 104*  BUN 42* 38* 35* 30*  CREATININE 1.42* 1.12 1.04 0.99  CALCIUM 9.5 9.0 8.6* 8.5*  MG  --  2.0 2.0  --    GFR: Estimated Creatinine Clearance: 52.8 mL/min (by C-G formula based on SCr of 0.99 mg/dL). Liver Function Tests: No results for input(s): AST, ALT, ALKPHOS, BILITOT, PROT, ALBUMIN in the last 168 hours. No results for input(s): LIPASE, AMYLASE in the last 168 hours. No results for input(s): AMMONIA in the last 168 hours. Coagulation Profile: Recent Labs  Lab 12/14/18 1656  INR 1.9*   Cardiac Enzymes: No results for input(s): CKTOTAL, CKMB, CKMBINDEX, TROPONINI in the last 168 hours. BNP (last 3 results) No results for input(s): PROBNP in the last 8760 hours. HbA1C: No results for input(s): HGBA1C in the last 72 hours. CBG: No results for input(s): GLUCAP in the last 168 hours. Lipid Profile: No results for input(s): CHOL, HDL, LDLCALC, TRIG, CHOLHDL, LDLDIRECT in the last 72 hours. Thyroid Function Tests: No results for input(s): TSH, T4TOTAL, FREET4, T3FREE, THYROIDAB in the last 72 hours. Anemia Panel: No results for input(s): VITAMINB12, FOLATE, FERRITIN, TIBC, IRON, RETICCTPCT in the last 72 hours. Sepsis Labs: No results for input(s): PROCALCITON, LATICACIDVEN in the last 168 hours.  Recent Results (from the past 240 hour(s))  SARS CORONAVIRUS 2 (TAT 6-24 HRS) Nasopharyngeal Nasopharyngeal Swab  Status: None   Collection Time: 12/14/18  9:39 PM   Specimen: Nasopharyngeal Swab  Result Value Ref Range Status   SARS Coronavirus 2 NEGATIVE NEGATIVE Final    Comment: (NOTE) SARS-CoV-2 target nucleic acids are NOT DETECTED. The SARS-CoV-2 RNA is generally detectable in upper and lower respiratory specimens during the acute phase of infection. Negative results do not preclude SARS-CoV-2 infection, do not rule out co-infections with other pathogens,  and should not be used as the sole basis for treatment or other patient management decisions. Negative results must be combined with clinical observations, patient history, and epidemiological information. The expected result is Negative. Fact Sheet for Patients: SugarRoll.be Fact Sheet for Healthcare Providers: https://www.woods-mathews.com/ This test is not yet approved or cleared by the [place] FDA and  has been authorized for detection and/or diagnosis of SARS-CoV-2 by FDA under an Emergency Use Authorization (EUA). This EUA will remain  in effect (meaning this test can be used) for the duration of the COVID-19 declaration under Section 56 4(b)(1) of the Act, 21 U.S.C. section 360bbb-3(b)(1), unless the authorization is terminated or revoked sooner. Performed at [facility], [place] [address] [place], [place] [address]   Surgical PCR screen     Status: None   Collection Time: 12/15/18  2:04 AM   Specimen: Nasal Mucosa; Nasal Swab  Result Value Ref Range Status   MRSA, PCR NEGATIVE NEGATIVE Final   Staphylococcus aureus NEGATIVE NEGATIVE Final    Comment: (NOTE) The Xpert SA Assay (FDA approved for NASAL specimens in patients 83 years of age and older), is one component of a comprehensive surveillance program. It is not intended to diagnose infection nor to guide or monitor treatment. Performed at [facility], [place] [address], [place], [place] [address]          Radiology Studies: Dg Hip Unilat With Pelvis 2-3 Views Left  Result Date: 12/16/2018 CLINICAL DATA:  Left hip hemiarthroplasty. EXAM: DG HIP (WITH OR WITHOUT PELVIS) 2-3V LEFT COMPARISON:  Left hip x-rays dated December 14, 2018. FINDINGS: The left hip demonstrates a hemiarthroplasty without evidence of hardware failure or complication. There is expected intra-articular air. There is no fracture or dislocation. The alignment is anatomic. Post-surgical  changes noted in the surrounding soft tissues. Prior right hip hemiarthroplasty. IMPRESSION: Interval left hip hemiarthroplasty without acute postoperative complication. Electronically Signed   By: Titus Dubin M.D.   On: 12/16/2018 14:32        Scheduled Meds: . acetaminophen  500 mg Oral Q8H  . carbidopa-levodopa  1 tablet Oral TID  . docusate sodium  100 mg Oral BID  . mupirocin ointment  1 application Nasal BID  . pneumococcal 23 valent vaccine  0.5 mL Intramuscular Tomorrow-1000  . rivaroxaban  20 mg Oral Q supper   Continuous Infusions: . sodium chloride 75 mL/hr at 12/17/18 1710  . lactated ringers       LOS: 4 days    Time spent: 25 to 30 minutes    Ezekiel Slocumb, DO [facility] Pager: [phone]  If 7PM-7AM, please contact night-coverage [email or web address] Password [email or web address] 12/18/2018, 7:18 AM

## 2018-12-18 NOTE — Discharge Instructions (Addendum)
Information on my medicine - XARELTO (Rivaroxaban)  This medication education was reviewed with me or my healthcare representative as part of my discharge preparation.    Why was Xarelto prescribed for you? Xarelto was prescribed for you to reduce the risk of a blood clot forming that can cause a stroke if you have a medical condition called atrial fibrillation (a type of irregular heartbeat).  What do you need to know about xarelto ? Take your Xarelto ONCE DAILY at the same time every day with your evening meal. If you have difficulty swallowing the tablet whole, you may crush it and mix in applesauce just prior to taking your dose.  Take Xarelto exactly as prescribed by your doctor and DO NOT stop taking Xarelto without talking to the doctor who prescribed the medication.  Stopping without other stroke prevention medication to take the place of Xarelto may increase your risk of developing a clot that causes a stroke.  Refill your prescription before you run out.  After discharge, you should have regular check-up appointments with your healthcare provider that is prescribing your Xarelto.  In the future your dose may need to be changed if your kidney function or weight changes by a significant amount.  What do you do if you miss a dose? If you are taking Xarelto ONCE DAILY and you miss a dose, take it as soon as you remember on the same day then continue your regularly scheduled once daily regimen the next day. Do not take two doses of Xarelto at the same time or on the same day.   Important Safety Information A possible side effect of Xarelto is bleeding. You should call your healthcare provider right away if you experience any of the following: ? Bleeding from an injury or your nose that does not stop. ? Unusual colored urine (red or dark brown) or unusual colored stools (red or black). ? Unusual bruising for unknown reasons. ? A serious fall or if you hit your head (even if  there is no bleeding).  Some medicines may interact with Xarelto and might increase your risk of bleeding while on Xarelto. To help avoid this, consult your healthcare provider or pharmacist prior to using any new prescription or non-prescription medications, including herbals, vitamins, non-steroidal anti-inflammatory drugs (NSAIDs) and supplements.  This website has more information on Xarelto: [email or web address]     Orthopaedic Trauma Service Discharge Instructions   General Discharge Instructions  Orthopaedic Injuries:  1. Left femoral neck fracture. You had a Left hip hemiarthroplasty to fix your injury   WEIGHT BEARING STATUS: Weightbearing as tolerated   RANGE OF MOTION/ACTIVITY: posterior hip precautions left hip.  Get up with assistance   Bone health:  Continue you vitamin d. Your vitamin d levels look great   Wound Care: daily wound care. See below  Discharge Wound Care Instructions  Do NOT apply any ointments, solutions or lotions to pin sites or surgical wounds.  These prevent needed drainage and even though solutions like hydrogen peroxide kill bacteria, they also damage cells lining the pin sites that help fight infection.  Applying lotions or ointments can keep the wounds moist and can cause them to breakdown and open up as well. This can increase the risk for infection. When in doubt call the office.  Surgical incisions should be dressed daily.  If any drainage is noted, use one layer of adaptic, 4x4 gauze and tape. Alternatively you can use a mepilex dressing   Once the incision is completely dry  and without drainage, it may be left open to air out.  Showering may begin 36-48 hours later.  Cleaning gently with soap and water.  DVT/PE prophylaxis: continue your xarelto   Diet: as you were eating previously.  Can use over the counter stool softeners and bowel preparations, such as Miralax, to help with bowel movements.  Narcotics can be constipating.  Be sure to  drink plenty of fluids  PAIN MEDICATION USE AND EXPECTATIONS  You have likely been given narcotic medications to help control your pain.  After a traumatic event that results in an fracture (broken bone) with or without surgery, it is ok to use narcotic pain medications to help control one's pain.  We understand that everyone responds to pain differently and each individual patient will be evaluated on a regular basis for the continued need for narcotic medications. Ideally, narcotic medication use should last no more than 6-8 weeks (coinciding with fracture healing).   As a patient it is your responsibility as well to monitor narcotic medication use and report the amount and frequency you use these medications when you come to your office visit.   We would also advise that if you are using narcotic medications, you should take a dose prior to therapy to maximize you participation.  IF YOU ARE ON NARCOTIC MEDICATIONS IT IS NOT PERMISSIBLE TO OPERATE A MOTOR VEHICLE (MOTORCYCLE/CAR/TRUCK/MOPED) OR HEAVY MACHINERY DO NOT MIX NARCOTICS WITH OTHER CNS (CENTRAL NERVOUS SYSTEM) DEPRESSANTS SUCH AS ALCOHOL   STOP SMOKING OR USING NICOTINE PRODUCTS!!!!  As discussed nicotine severely impairs your body's ability to heal surgical and traumatic wounds but also impairs bone healing.  Wounds and bone heal by forming microscopic blood vessels (angiogenesis) and nicotine is a vasoconstrictor (essentially, shrinks blood vessels).  Therefore, if vasoconstriction occurs to these microscopic blood vessels they essentially disappear and are unable to deliver necessary nutrients to the healing tissue.  This is one modifiable factor that you can do to dramatically increase your chances of healing your injury.    (This means no smoking, no nicotine gum, patches, etc)  DO NOT USE NONSTEROIDAL ANTI-INFLAMMATORY DRUGS (NSAID'S)  Using products such as Advil (ibuprofen), Aleve (naproxen), Motrin (ibuprofen) for additional pain  control during fracture healing can delay and/or prevent the healing response.  If you would like to take over the counter (OTC) medication, Tylenol (acetaminophen) is ok.  However, some narcotic medications that are given for pain control contain acetaminophen as well. Therefore, you should not exceed more than 4000 mg of tylenol in a day if you do not have liver disease.  Also note that there are may OTC medicines, such as cold medicines and allergy medicines that my contain tylenol as well.  If you have any questions about medications and/or interactions please ask your doctor/PA or your pharmacist.      ICE AND ELEVATE INJURED/OPERATIVE EXTREMITY  Using ice and elevating the injured extremity above your heart can help with swelling and pain control.  Icing in a pulsatile fashion, such as 20 minutes on and 20 minutes off, can be followed.    Do not place ice directly on skin. Make sure there is a barrier between to skin and the ice pack.    Using frozen items such as frozen peas works well as the conform nicely to the are that needs to be iced.  USE AN ACE WRAP OR TED HOSE FOR SWELLING CONTROL  In addition to icing and elevation, Ace wraps or TED hose are used  to help limit and resolve swelling.  It is recommended to use Ace wraps or TED hose until you are informed to stop.    When using Ace Wraps start the wrapping distally (farthest away from the body) and wrap proximally (closer to the body)   Example: If you had surgery on your leg or thing and you do not have a splint on, start the ace wrap at the toes and work your way up to the thigh        If you had surgery on your upper extremity and do not have a splint on, start the ace wrap at your fingers and work your way up to the upper arm  IF YOU ARE IN A SPLINT OR CAST DO NOT [place]   If your splint gets wet for any reason please contact the office immediately. You may shower in your splint or cast as long as you keep it dry.   This can be done by wrapping in a cast cover or garbage back (or similar)  Do Not stick any thing down your splint or cast such as pencils, money, or hangers to try and scratch yourself with.  If you feel itchy take benadryl as prescribed on the bottle for itching  IF YOU ARE IN A CAM BOOT (BLACK BOOT)  You may remove boot periodically. Perform daily dressing changes as noted below.  Wash the liner of the boot regularly and wear a sock when wearing the boot. It is recommended that you sleep in the boot until told otherwise    Call office for the following:  Temperature greater than 101F  Persistent nausea and vomiting  Severe uncontrolled pain  Redness, tenderness, or signs of infection (pain, swelling, redness, odor or green/yellow discharge around the site)  Difficulty breathing, headache or visual disturbances  Hives  Persistent dizziness or light-headedness  Extreme fatigue  Any other questions or concerns you may have after discharge  In an emergency, call 911 or go to an Emergency Department at a nearby hospital    [place]: [phone]   VISIT OUR WEBSITE FOR ADDITIONAL INFORMATION: [email or web address]

## 2018-12-18 NOTE — Evaluation (Signed)
Clinical/Bedside Swallow Evaluation Patient Details  Name: Chad Russell MRN: [id number] Date of Birth: 1926-05-14  Today's Date: 12/18/2018 Time: SLP Start Time (ACUTE ONLY): 77 SLP Stop Time (ACUTE ONLY): 1450 SLP Time Calculation (min) (ACUTE ONLY): 35 min  Past Medical History:  Past Medical History:  Diagnosis Date  . Arthritis   . Atrial fibrillation ([place])   . Balance problem 10/31/2013  . BPH (benign prostatic hyperplasia) 2014   Urology Dr [name] with [facility] [phone]   . Bradycardia    a. baseline HR in the 30's. Asymptomatic - no history of PPM placement.   Marland Kitchen CAD (coronary artery disease)    LHC 03/16/11: Mid LAD 10-20%, proximal circumflex 10%, mid RCA 20%, EF 55%.  . Cataract   . Complete heart block ([place])   . Fracture of femoral neck, right ([place]) 04/19/2017  . GERD (gastroesophageal reflux disease)    takes occasional  zantac  . Hx of echocardiogram    Echo 02/2011: EF 65-70%, normal wall motion, MAC, mild MR, mild LAE, mild RVE  . Hypertension   . OSA (obstructive sleep apnea)    per patient went to have sleep study done about 2 years ago, bu they never F/U with him about results; but wife reports he still has periods of apnea at night   . Talar fracture    casted no surg   Past Surgical History:  Past Surgical History:  Procedure Laterality Date  . ABDOMINAL AORTOGRAM W/LOWER EXTREMITY N/A 01/17/2017   Procedure: ABDOMINAL AORTOGRAM W/LOWER EXTREMITY;  Surgeon: Angelia Mould, MD;  Location: [facility];  Service: Cardiovascular;  Laterality: N/A;  . COLONOSCOPY     Repeated and normal in 2011  . FRACTURE SURGERY    . HEMIARTHROPLASTY HIP Right 04/19/2017  . HEMORROIDECTOMY    . HIP ARTHROPLASTY Right 04/19/2017   Procedure: ARTHROPLASTY BIPOLAR HIP (HEMIARTHROPLASTY);  Surgeon: Marchia Bond, MD;  Location: [facility];  Service: Orthopedics;  Laterality: Right;  . INGUINAL HERNIA REPAIR Right 02/26/2014   Procedure: LAPAROSCOPIC RIGHT INGUINAL  HERNIA REPAIR;  Surgeon: Ralene Ok, MD;  Location: [facility];  Service: General;  Laterality: Right;  . INGUINAL HERNIA REPAIR    . INSERTION OF MESH Right 02/26/2014   Procedure: INSERTION OF MESH;  Surgeon: Ralene Ok, MD;  Location: [facility];  Service: General;  Laterality: Right;  . LEFT HEART CATHETERIZATION WITH CORONARY ANGIOGRAM N/A 03/16/2011   Procedure: LEFT HEART CATHETERIZATION WITH CORONARY ANGIOGRAM;  Surgeon: Peter M Martinique, MD;  Location: [facility];  Service: Cardiovascular;  Laterality: N/A;  . PROSTATE BIOPSY     negative - [facility]  . TRANSURETHRAL RESECTION OF PROSTATE N/A 09/07/2016   Procedure: TRANSURETHRAL RESECTION OF THE PROSTATE (TURP);  Surgeon: Irine Seal, MD;  Location: [facility];  Service: Urology;  Laterality: N/A;   HPI:  85yo male admitted 12/14/2018 s/p fall and left hip pain. PMH: GERD (occasional PPI), Parkinson's, complete heart block, CAD, AFib, HTN, OSA. R hip fx 2019.  HeadCT = no acute findings   Assessment / Plan / Recommendation Clinical Impression  Pt presents with adequate natural dentition. Oral motor strength and function WNL. Pt reports intermittent coughing after po intake. No overt s/s aspiration observed on thin liquid, puree, or solid trials at this time, however, pt is at increased risk for aspiration given multiple comorbidities including advanced age, Parkinson's disease, and GERD. Recommend consideration of Regular Barium Swallow to evaluate esophageal motility. SLP provided written behavioral and dietary strategies  for managment of esophageal dysmotility, and reviewed this information with pt and wife. SLP will follow acutely for education.   SLP Visit Diagnosis: Dysphagia, unspecified (R13.10)    Aspiration Risk  Mild aspiration risk    Diet Recommendation Regular;Thin liquid   Liquid Administration via: Cup;Straw Medication Administration: Whole meds with liquid Supervision: Patient able to self feed;Staff to assist with  self feeding Compensations: Slow rate;Small sips/bites Postural Changes: Seated upright at 90 degrees;Remain upright for at least 30 minutes after po intake    Other  Recommendations Recommended Consults: Consider esophageal assessment Oral Care Recommendations: Oral care QID   Follow up Recommendations Skilled Nursing facility      Frequency and Duration min 1 x/week  1 week;2 weeks       Prognosis Prognosis for Safe Diet Advancement: Good      Swallow Study   General Date of Onset: 12/14/18 HPI: 85yo male admitted 12/14/2018 s/p fall and left hip pain. PMH: GERD (occasional PPI), Parkinson's, complete heart block, CAD, AFib, HTN, OSA. R hip fx 2019.  HeadCT = no acute findings Type of Study: Bedside Swallow Evaluation Previous Swallow Assessment: none Diet Prior to this Study: Regular;Thin liquids Temperature Spikes Noted: No Respiratory Status: Room air History of Recent Intubation: Yes Length of Intubations (days): 1 days Date extubated: 12/16/18(intubated for surgery) Behavior/Cognition: Alert;Cooperative;Pleasant mood Oral Cavity Assessment: Within Functional Limits Oral Care Completed by SLP: No Oral Cavity - Dentition: Adequate natural dentition Vision: Functional for self-feeding Self-Feeding Abilities: Needs assist Patient Positioning: Upright in bed Baseline Vocal Quality: Normal Volitional Cough: Strong Volitional Swallow: Able to elicit    Oral/Motor/Sensory Function Overall Oral Motor/Sensory Function: Within functional limits   Ice Chips Ice chips: Not tested   Thin Liquid Thin Liquid: Within functional limits Presentation: Straw    Nectar Thick Nectar Thick Liquid: Not tested   Honey Thick Honey Thick Liquid: Not tested   Puree Puree: Within functional limits Presentation: Spoon   Solid     Solid: Within functional limits Presentation: [place], [facility], [place] Speech Language Pathologist Office: [phone] Pager:  [phone]  Shonna Chock 12/18/2018,3:05 PM

## 2018-12-18 NOTE — Progress Notes (Signed)
Orthopaedic Trauma Service Progress Note  Patient ID: Chad Russell MRN: [id number] DOB/AGE: May 05, 1926 85 y.o.  Subjective:  Doing well  Working with therapy  Pain well controlled    ROS As above  Objective:   VITALS:   Vitals:   12/17/18 1349 12/17/18 1935 12/18/18 0316 12/18/18 0848  BP: 103/69 105/75 120/66 124/63  Pulse: (!) 36 (!) 36 (!) 37 (!) 37  Resp:    14  Temp: 98.3 F (36.8 C) 98.4 F (36.9 C) 97.8 F (36.6 C) 98.6 F (37 C)  TempSrc: Oral Oral Oral Oral  SpO2: 97% 99% 100% 99%  Weight:      Height:        Estimated body mass index is 24.18 kg/m as calculated from the following:   Height as of this encounter: 5\' 11"  (1.803 m).   Weight as of this encounter: 78.6 kg.   Intake/Output      10/25 0701 - 10/26 0700 10/26 0701 - 10/27 0700   P.O. 114    I.V. (mL/kg)     IV Piggyback     Total Intake(mL/kg) 114 (1.5)    Urine (mL/kg/hr) 400 (0.2)    Stool 0    Blood     Total Output 400    Net -286         Stool Occurrence 3 x      LABS  Results for orders placed or performed during the hospital encounter of 12/14/18 (from the past 24 hour(s))  CBC     Status: Abnormal   Collection Time: 12/18/18  4:05 AM  Result Value Ref Range   WBC 10.5 4.0 - 10.5 K/uL   RBC 3.27 (L) 4.22 - 5.81 MIL/uL   Hemoglobin 10.6 (L) 13.0 - 17.0 g/dL   HCT 32.7 (L) 39.0 - 52.0 %   MCV 100.0 80.0 - 100.0 fL   MCH 32.4 26.0 - 34.0 pg   MCHC 32.4 30.0 - 36.0 g/dL   RDW 14.1 11.5 - 15.5 %   Platelets 164 150 - 400 K/uL   nRBC 0.0 0.0 - 0.2 %  Basic metabolic panel     Status: Abnormal   Collection Time: 12/18/18  4:05 AM  Result Value Ref Range   Sodium 141 135 - 145 mmol/L   Potassium 4.1 3.5 - 5.1 mmol/L   Chloride 109 98 - 111 mmol/L   CO2 25 22 - 32 mmol/L   Glucose, Bld 104 (H) 70 - 99 mg/dL   BUN 30 (H) 8 - 23 mg/dL   Creatinine, Ser 0.99 0.61 - 1.24 mg/dL   Calcium 8.5 (L)  8.9 - 10.3 mg/dL   GFR calc non Af Amer >60 >60 mL/min   GFR calc Af Amer >60 >60 mL/min   Anion gap 7 5 - 15     PHYSICAL EXAM:   Gen: sitting on EOB, therapy about to stand him up  Ext:       Left lower extremity             Dressing is clean, dry and intact, minute spots of drainage noted              Minimal swelling             Extremity is warm             +  DP pulse             No DCT             Compartments are soft and nontender             Distal motor and sensory functions are intact              Assessment/Plan: 2 Days Post-Op   Principal Problem:   Fracture of femoral neck, left (HCC) Active Problems:   Complete heart block (HCC)   HTN (hypertension)   Atrial fibrillation (H[facility])   BPH (benign prostatic hyperplasia)   AKI (acute kidney injury) ([place])   Dementia due to Parkinson's disease without behavioral disturbance ([place])   Anti-infectives (From admission, onward)   Start     Dose/Rate Route Frequency Ordered Stop   12/16/18 1400  ceFAZolin (ANCEF) IVPB 2g/100 mL premix    Note to Pharmacy: Tolerated ancef in OR   2 g 200 mL/hr over 30 Minutes Intravenous Every 6 hours 12/16/18 1201 12/16/18 2027   12/16/18 0802  ceFAZolin (ANCEF) 2-4 GM/100ML-% IVPB    Note to Pharmacy: Rejeana Brock   : cabinet override      12/16/18 0802 12/16/18 2014    .  POD/HD#: 2  85 y/o male s/p fall with L femoral neck fracture   -displaced L femoral neck fracture s/p left hip hemiarthroplasty             Weight-bear as tolerated             Posterior hip precautions             PT and OT             Ice             Dressing changes as needed   - Pain management:             Continue with current regimen    Scheduled tylenol    norco as needed   - ABL anemia/Hemodynamics             Cbc in am             Stable   - Medical issues              Per primary    - DVT/PE prophylaxis:             On Xarelto chronically for A. Fib             Restarted on postop  day 1    - ID:              periop abx completed    - Metabolic Bone Disease:             Vitamin D levels look outstanding                         Continue with home regimen               2nd hip fracture in a year and half             Suggestive of very poor bone quality      - FEN/GI prophylaxis/Foley/Lines:             Diet as tolerated    - Dispo:             Therapies  Inpatient rehab consult               Wife does not want a SNF.  If unable to get into inpatient rehab she will take him home however she is the only one at home to assist and I do not think she is capable of managing patient on own     Jari Pigg, Hershal Coria [phone] (C) 12/18/2018, 10:54 AM  [facility] [place] [address] [phone] (F)

## 2018-12-18 NOTE — Plan of Care (Signed)

## 2018-12-18 NOTE — Evaluation (Signed)
Occupational Therapy Evaluation Patient Details Name: Chad Russell MRN: [id number] DOB: 1926-06-24 Today's Date: 12/18/2018    History of Present Illness 85 y.o. male with medical history significant of Parkinson's disease, complete heart block (x 20 years with no indication for pacemaker), CAD, atrial fibrillation on Xarelto, hypertension, OSA not on CPAP.  He presented on 10/22 with left hip pain status post a mechanical fall the night before.  He reported a slip in the foyer of his home when not using his walker, resulting in left femoral neck fracture. Now, s/p L hip hemiarthroplasty, posterior hip precautions, WBAT   Clinical Impression   Pt typically walks with a RW and is assisted for LB ADL and all IADL. Pt currently requires +2 assist and use of stedy for transfers and +2 assist for bed mobility. ADL requires set up to total assist. Pt's wife can offer minimum assistance once he goes home. Recommending CIR.Will follow acutely.    Follow Up Recommendations  CIR    Equipment Recommendations  (defer to CIR)    Recommendations for Other Services Rehab consult     Precautions / Restrictions Precautions Precautions: Fall;Posterior Hip Precaution Booklet Issued: Yes (comment) Precaution Comments: pt able to state 3/3 posterior hip precautions Restrictions Weight Bearing Restrictions: Yes LLE Weight Bearing: Weight bearing as tolerated      Mobility Bed Mobility Overal bed mobility: Needs Assistance Bed Mobility: Sit to Supine      Sit to supine: +2 for physical assistance;Max assist   General bed mobility comments: assist to guide shoulders and for LEs back into bed  Transfers Overall transfer level: Needs assistance   Transfers: Sit to/from Stand Sit to Stand: Mod assist;+2 safety/equipment         General transfer comment: assist to position hips with pad to edge of chair, assist to rise    Balance Overall balance assessment: Needs assistance;History of  Falls   Sitting balance-Leahy Scale: Fair Sitting balance - Comments: sat briefly at EOB prior to return to supine with min guard assist     Standing balance-Leahy Scale: Poor                             ADL either performed or assessed with clinical judgement   ADL Overall ADL's : Needs assistance/impaired Eating/Feeding: Set up;Sitting   Grooming: Set up;Sitting;Wash/dry hands;Wash/dry face;Oral care   Upper Body Bathing: Moderate assistance;Sitting   Lower Body Bathing: +2 for physical assistance;Total assistance;Sit to/from stand   Upper Body Dressing : Minimal assistance;Sitting   Lower Body Dressing: +2 for physical assistance;Total assistance;Sit to/from stand                       Vision Baseline Vision/History: Wears glasses Wears Glasses: At all times Patient Visual Report: No change from baseline       Perception     Praxis      Pertinent Vitals/Pain Pain Assessment: Faces Pain Score: 6  Faces Pain Scale: No hurt Pain Location: L hip with movement, pt recognizes anxiety component of his pain Pain Descriptors / Indicators: Grimacing;Guarding     Hand Dominance Right   Extremity/Trunk Assessment Upper Extremity Assessment Upper Extremity Assessment: Overall WFL for tasks assessed   Lower Extremity Assessment Lower Extremity Assessment: Defer to PT evaluation       Communication Communication Communication: No difficulties   Cognition Arousal/Alertness: Awake/alert Behavior During Therapy: WFL for tasks assessed/performed Overall Cognitive Status:  Within Functional Limits for tasks assessed                                     General Comments    Exercises   Shoulder Instructions      Home Living Family/patient expects to be discharged to:: Private residence Living Arrangements: Spouse/significant other;Children Available Help at Discharge: Family;Available 24 hours/day Type of Home: House Home Access:  Stairs to enter [employer] of Steps: 2   Home Layout: Two level;Able to live on main level with bedroom/bathroom     Bathroom Shower/Tub: Tub/shower unit   Bathroom Toilet: Handicapped height     Home Equipment: Environmental consultant - 4 wheels;Walker - 2 wheels;Cane - single point          Prior Functioning/Environment Level of Independence: Needs assistance  Gait / Transfers Assistance Needed: walks with RW ADL's / Homemaking Assistance Needed: wife assists with LB bathing and dressing and all IADL            OT Problem List: Impaired balance (sitting and/or standing);Decreased knowledge of use of DME or AE;Pain      OT Treatment/Interventions: Self-care/ADL training;DME and/or AE instruction;Patient/family education;Balance training;Therapeutic activities    OT Goals(Current goals can be found in the care plan section) Acute Rehab OT Goals Patient Stated Goal: go to rehab to regain strength, and get home OT Goal Formulation: With patient Time For Goal Achievement: 01/01/19 Potential to Achieve Goals: Good ADL Goals Pt Will Perform Grooming: (P) standing;with min assist Pt Will Perform Lower Body Bathing: (P) with min assist;with adaptive equipment;sit to/from stand Pt Will Perform Lower Body Dressing: (P) with min assist;with adaptive equipment;sit to/from stand Pt Will Transfer to Toilet: (P) with min assist;ambulating;bedside commode Pt Will Perform Toileting - Clothing Manipulation and hygiene: (P) with min assist;sit to/from stand Additional ADL Goal #1: (P) Pt will perform bed mobility with min assist in preparation for ADL.  OT Frequency: Min 2X/week   Barriers to D/C:            Co-evaluation              AM-PAC OT "6 Clicks" Daily Activity     Outcome Measure Help from another person eating meals?: A Little Help from another person taking care of personal grooming?: A Little Help from another person toileting, which includes using toliet, bedpan,  or urinal?: Total Help from another person bathing (including washing, rinsing, drying)?: A Lot Help from another person to put on and taking off regular upper body clothing?: Total   6 Click Score: 10   End of Session Equipment Utilized During Treatment: Gait belt Nurse Communication: Need for lift equipment  Activity Tolerance: Patient tolerated treatment well Patient left: in bed;with call bell/phone within reach(on bed pan)  OT Visit Diagnosis: Unsteadiness on feet (R26.81);History of falling (Z91.81);Pain                Time: 1202-1220 OT Time Calculation (min): 18 min Charges:  OT General Charges $OT Visit: 1 Visit OT Evaluation $OT Eval Moderate Complexity: 1 Mod  Nestor Lewandowsky, OTR/L Acute Rehabilitation Services Pager: [phone] Office: [phone]  Malka So 12/18/2018, 2:43 PM

## 2018-12-18 NOTE — Progress Notes (Signed)
Physical Therapy Treatment Patient Details Name: Chad Russell MRN: [id number] DOB: Jan 19, 1927 Today's Date: 12/18/2018    History of Present Illness 85 y.o. male with medical history significant of Parkinson's disease, complete heart block (x 20 years with no indication for pacemaker), CAD, atrial fibrillation on Xarelto, hypertension, OSA not on CPAP.  He presented on 10/22 with left hip pain status post a mechanical fall the night before.  He reported a slip in the foyer of his home when not using his walker, resulting in left femoral neck fracture. Now, s/p L hip hemiarthroplasty, posterior hip precautions, WBAT    PT Comments    Continuing work on functional mobility and activity tolerance;  Still slow moving, but smoother movement and transitions than yesterday's session; It seems his confidence around mobilizing is growing; Used the [place] today for sit to stand with success; I anticipate continuing improvements with more therapies; Continue to recommend comprehensive inpatient rehab (CIR) for post-acute therapy needs to maximize independence and safety with mobility in order to safely dc home   Follow Up Recommendations  CIR;Supervision/Assistance - 24 hour     Equipment Recommendations  None recommended by PT    Recommendations for Other Services Rehab consult     Precautions / Restrictions Precautions Precautions: Fall;Posterior Hip Precaution Booklet Issued: Yes (comment) Precaution Comments: Pt able to recall and teachback 3/3 post Hip Precautions Restrictions LLE Weight Bearing: Weight bearing as tolerated    Mobility  Bed Mobility Overal bed mobility: Needs Assistance Bed Mobility: Supine to Sit     Supine to sit: Max assist;+2 for physical assistance     General bed mobility comments: Smoother motion than last session; mod assist to half-bridge to EOB with close guard to prevent interanl rotation and adduction; Lighter, but still max assist to elevate trunk  to sit; good reach for bedrail to help self up  Transfers Overall transfer level: Needs assistance   Transfers: Sit to/from Stand Sit to Stand: Mod assist;+2 safety/equipment         General transfer comment: Cues for hand placement and close guard for hip precautions; Mod assist to power up  Ambulation/Gait             General Gait Details: Attempted marching in place inside of Denna Haggard; difficulty shifting weight into L stance; tending to have knees slightly bent in stance   Stairs             Wheelchair Mobility    Modified Rankin (Stroke Patients Only)       Balance Overall balance assessment: Needs assistance;History of Falls   Sitting balance-Leahy Scale: (approaching Fair) Sitting balance - Comments: continuing work on sitting posture, trunk extension, anterior pelvic tilt                                    Cognition Arousal/Alertness: Awake/alert Behavior During Therapy: WFL for tasks assessed/performed Overall Cognitive Status: Within Functional Limits for tasks assessed                                        Exercises Total Joint Exercises Heel Slides: AROM;Left;10 reps    General Comments General comments (skin integrity, edema, etc.): VSS on supplemental O2; Will consider working without supplemental O2 next session and monitor sats      Pertinent Vitals/Pain Pain  Assessment: 0-10 Pain Score: 6  Faces Pain Scale: Hurts little more Pain Location: L hip with movement, pt recognizes anxiety component of his pain Pain Descriptors / Indicators: Grimacing;Guarding    Home Living                      Prior Function            PT Goals (current goals can now be found in the care plan section) Acute Rehab PT Goals Patient Stated Goal: go to rehab to regain strength, and get home PT Goal Formulation: With patient Time For Goal Achievement: 12/31/18 Potential to Achieve Goals: Fair Progress  towards PT goals: Progressing toward goals    Frequency    Min 5X/week      PT Plan Current plan remains appropriate    Co-evaluation              AM-PAC PT "6 Clicks" Mobility   Outcome Measure  Help needed turning from your back to your side while in a flat bed without using bedrails?: A Lot Help needed moving from lying on your back to sitting on the side of a flat bed without using bedrails?: A Lot Help needed moving to and from a bed to a chair (including a wheelchair)?: A Lot Help needed standing up from a chair using your arms (e.g., wheelchair or bedside chair)?: A Lot Help needed to walk in hospital room?: Total Help needed climbing 3-5 steps with a railing? : Total 6 Click Score: 10    End of Session Equipment Utilized During Treatment: Gait belt Activity Tolerance: Patient tolerated treatment well Patient left: in chair;with call bell/phone within reach;with chair alarm set Nurse Communication: Mobility status;Other (comment);Need for lift equipment(IV status) PT Visit Diagnosis: Unsteadiness on feet (R26.81);Other abnormalities of gait and mobility (R26.89);Repeated falls (R29.6);Muscle weakness (generalized) (M62.81);History of falling (Z91.81)     Time: 1021-1100 PT Time Calculation (min) (ACUTE ONLY): 39 min  Charges:  $Therapeutic Activity: 38-52 mins                     Roney Marion, PT  Acute Rehabilitation Services Pager [phone] Office [place] 12/18/2018, 12:55 PM

## 2018-12-18 NOTE — Plan of Care (Signed)
  Problem: Clinical Measurements: Goal: Respiratory complications will improve Outcome: Progressing   Problem: Activity: Goal: Risk for activity intolerance will decrease Outcome: Progressing   Problem: Nutrition: Goal: Adequate nutrition will be maintained Outcome: Progressing   Problem: Pain Managment: Goal: General experience of comfort will improve Outcome: Progressing   Problem: Safety: Goal: Ability to remain free from injury will improve Outcome: Progressing   

## 2018-12-19 ENCOUNTER — Inpatient Hospital Stay (HOSPITAL_COMMUNITY): Payer: Medicare HMO

## 2018-12-19 ENCOUNTER — Encounter (HOSPITAL_COMMUNITY): Payer: Self-pay | Admitting: Orthopedic Surgery

## 2018-12-19 LAB — MAGNESIUM: Magnesium: 2 mg/dL (ref 1.7–2.4)

## 2018-12-19 LAB — BASIC METABOLIC PANEL
Anion gap: 8 (ref 5–15)
BUN: 31 mg/dL — ABNORMAL HIGH (ref 8–23)
CO2: 23 mmol/L (ref 22–32)
Calcium: 8.7 mg/dL — ABNORMAL LOW (ref 8.9–10.3)
Chloride: 111 mmol/L (ref 98–111)
Creatinine, Ser: 0.87 mg/dL (ref 0.61–1.24)
GFR calc Af Amer: >60 mL/min (ref >60)
GFR calc non Af Amer: >60 mL/min (ref >60)
Glucose, Bld: 101 mg/dL — ABNORMAL HIGH (ref 70–99)
Potassium: 3.9 mmol/L (ref 3.5–5.1)
Sodium: 142 mmol/L (ref 135–145)

## 2018-12-19 LAB — CBC
HCT: 32.7 % — ABNORMAL LOW (ref 39.0–52.0)
Hemoglobin: 10.7 g/dL — ABNORMAL LOW (ref 13.0–17.0)
MCH: 33 pg (ref 26.0–34.0)
MCHC: 32.7 g/dL (ref 30.0–36.0)
MCV: 100.9 fL — ABNORMAL HIGH (ref 80.0–100.0)
Platelets: 195 10*3/uL (ref 150–400)
RBC: 3.24 MIL/uL — ABNORMAL LOW (ref 4.22–5.81)
RDW: 14.2 % (ref 11.5–15.5)
WBC: 9.1 10*3/uL (ref 4.0–10.5)
nRBC: 0 % (ref 0.0–0.2)

## 2018-12-19 LAB — GLUCOSE, CAPILLARY: Glucose-Capillary: 97 mg/dL (ref 70–99)

## 2018-12-19 MED ORDER — ENSURE ENLIVE PO LIQD
237.0000 mL | Freq: Two times a day (BID) | ORAL | Status: DC
  Administered 2018-12-19 – 2018-12-26 (×13): 237 mL via ORAL

## 2018-12-19 NOTE — Progress Notes (Signed)
Initial Nutrition Assessment  DOCUMENTATION CODES:   Not applicable  INTERVENTION:  Provide Ensure Enlive po BID, each supplement provides 350 kcal and 20 grams of protein  Encourage adequate PO intake.   NUTRITION DIAGNOSIS:   Increased nutrient needs related to post-op healing as evidenced by estimated needs.  GOAL:   Patient will meet greater than or equal to 90% of their needs  MONITOR:   PO intake, Supplement acceptance, Skin, Weight trends, Labs, I & O's  REASON FOR ASSESSMENT:   Consult Assessment of nutrition requirement/status  ASSESSMENT:   85 y.o. male with medical history significant of Parkinson's disease, complete heart block (had for 20 years with no indication for pacemaker), CAD, atrial fibrillation on Xarelto, hypertension, OSA not on CPAP with concerns of left hip pain status post a fall.  PROCEDURE (10/24) UNIPOLAR HEMIARTHROPLASTY OF THE LEFT HIP   Family at bedside. Pt report appetite has been improving. Meal completion has been 50-100%. Pt reports eating well PTA with usual consumption of at least 2 meals a day with occasional snacks. Pt with no significant weight loss per weight records. RD to order nutritional supplements to aid in post op healing. Family has been encouraging pt intake at meals. Labs and medications reviewed.   NUTRITION - FOCUSED PHYSICAL EXAM:   Moderate fat mass depletion likely related to the natural aging process.     Most Recent Value  Orbital Region  Unable to assess  Upper Arm Region  Moderate depletion  Thoracic and Lumbar Region  Moderate depletion  Buccal Region  Unable to assess  Temple Region  Unable to assess  Clavicle Bone Region  Severe depletion  Clavicle and Acromion Bone Region  Severe depletion  Scapular Bone Region  Unable to assess  Dorsal Hand  Unable to assess  Patellar Region  Mild depletion  Anterior Thigh Region  Mild depletion  Posterior Calf Region  Mild depletion  Edema (RD Assessment)  Mild   Hair  Reviewed  Eyes  Reviewed  Mouth  Reviewed  Skin  Reviewed  Nails  Reviewed       Diet Order:   Diet Order            Diet regular Room service appropriate? Yes; Fluid consistency: Thin  Diet effective now              EDUCATION NEEDS:   Not appropriate for education at this time  Skin:  Skin Assessment: Skin Integrity Issues: Skin Integrity Issues:: Incisions Incisions: L left  Last BM:  10/25  Height:   Ht Readings from Last 1 Encounters:  12/15/18 5\' 11"  (1.803 m)    Weight:   Wt Readings from Last 1 Encounters:  12/15/18 78.6 kg    Ideal Body Weight:  78.18 kg  BMI:  Body mass index is 24.18 kg/m.  Estimated Nutritional Needs:   Kcal:  1800-1950  Protein:  85-95 grams  Fluid:  >/= 1.8 L/day    Corrin Parker, MS, RD, LDN Pager # [phone] After hours/ weekend pager # [phone]

## 2018-12-19 NOTE — Plan of Care (Signed)
  Problem: Clinical Measurements: Goal: Respiratory complications will improve Outcome: Progressing   Problem: Activity: Goal: Risk for activity intolerance will decrease Outcome: Progressing   Problem: Nutrition: Goal: Adequate nutrition will be maintained Outcome: Progressing   Problem: Coping: Goal: Level of anxiety will decrease Outcome: Progressing   Problem: Pain Managment: Goal: General experience of comfort will improve Outcome: Progressing   

## 2018-12-19 NOTE — Progress Notes (Signed)
PROGRESS NOTE    Chad Russell  [id number] DOB: [birth year] DOA: 12/14/2018  PCP: [name], MD    LOS - 5   Brief Narrative:  85 y.o.malewith medical history significant of Parkinson's disease, complete heart block(x20 years with no indication for pacemaker), CAD, atrial fibrillation on Xarelto, hypertension, OSA not on CPAP.He presented on 10/22 withleft hip pain status post amechanicalfall the night before, the next morning becauseunable to bear weight or get out of bed, EMS called.Also reported pain with LUE movement and in the left lower ribs.In the ED, bradycardic in the 40s but asymptomatic (this is chronic). Labs notable for leukocytosis of 16.8, creatinine 1.42 (previously normal). CT head negative. Rib and chest x-rays negative for fractures. Hip and femur x-ray did show acute fracture of the left femoral neck. Orthopedics was consulted.  Patient admitted with hip fracture protocol and medical optimization for surgery. Xarelto held perioperatively.Underwentleft hip hemiarthroplasty on 10/24.Evaluated by PT/OT who recommended comprehensive inpatient rehab due to greatly decreased functional mobility and activity tolerance.  Subjective 10/27: Patient seen this morning, awake sitting up in bed, wife at bedside.  He reports an episode of confusion overnight, not knowing where he was, not allowing staff to touch him.  They called his wife at home and she was able to settle him down.  He jokes about this morning.  Says this is never happened before and he attributes it to being in an unfamiliar place.  States he feels great today.  Finally eating food after very poor appetite since admission.  Denies other acute complaints including fevers or chills, chest pain or shortness of breath, nausea vomiting or diarrhea.  Assessment & Plan:   Principal Problem:   Fracture of femoral neck, left (HCC) Active Problems:   Complete heart block (HCC)   HTN  (hypertension)   Atrial fibrillation (HCC)   AKI (acute kidney injury) (HCC)   BPH (benign prostatic hyperplasia)   Dementia due to Parkinson's disease without behavioral disturbance (HCC)   Left femoral neck fractures/p  MechanicalFall-Ortho plans for surgery tomorrow morning (10/24) -ResumeXarelto and continue SCD's -Weightbearing as tolerated -Posterior hip precautions -Ice -Dressing changes as needed -Pain control:     Scheduled Tylenol  Norco every 6 hours PRNmoderate pain -Bowel regimen -PT/OT -recommend comprehensive inpatient rehab  Left preauricular lesion, status post excision by dermatology, present on admission Patient was to have appointment today at dermatology to have stitches removed.  Wife reports dermatologist said they could be taken out by someone in the hospital.  Would touch base with his dermatologist prior to removing.  Incision appears fairly well-healed without signs of infection.  Concern for dysphagia -witnessed aspiration event at bedside this morning, patient sipping water from straw. -Speech therapy for swallow eval -Modified barium ordered, but canceled at wife and patient's request.  They denied patient ever having issues with swallowing and feeling unnecessary.  Acute blood loss anemia-appropriate for postop.  No evidence of ongoing bleeding and hemoglobin trend relatively stable.  -CBC in a.m.  Acute kidney injury-resolved. Suspect prerenal in the setting of dehydration. Received 1 L NS in the ED. -Initially his homelisinopril HCTZ were held, have not resumed as his blood pressures been well controlled and soft at times. -Avoid nephrotoxic agents and renally dose meds as indicated -BMP in the a.m.  Atrial fibrillation-chronic, rate controlled. CHA2DS2-VASc 3. Xarelto held for surgery, resumed postop.  Essentialhypertension-chronic, currently with soft blood pressures -Hold lisinopril HCTZ   Asymptomatic bradycardia in  the setting complete heart  block -Stable -No pacemaker indication. -Followed by cardiology outpatient  Parkinson's disease -Continue carbidopa levodopa  Left ear lesion- -Wife reports he had basal cell skin carcinoma removed outpatient recently -Wound care per RN   DVT prophylaxis:SCDs Code Status:Code Status: Full Code Family Communication:None at bedside Disposition Plan:Medically stable for discharge.  Accepted to [facility] inpatient rehab with PM&R, pending insurance authorization.  Consultants:  Ortho  Procedures:  None  Antimicrobials:  None   Objective: Vitals:   12/18/18 1329 12/18/18 1926 12/19/18 0317 12/19/18 0728  BP: 125/62 114/63 (!) 141/62 (!) 121/51  Pulse: (!) 36 (!) 35 (!) 37 (!) 33  Resp: 14 18 15 16   Temp: 97.8 F (36.6 C) 98.1 F (36.7 C) 97.9 F (36.6 C) (!) 97.4 F (36.3 C)  TempSrc: Oral  Oral Oral  SpO2: 99% 100% 100% 100%  Weight:      Height:        Intake/Output Summary (Last 24 hours) at 12/19/2018 0813 Last data filed at 12/18/2018 2330 Gross per 24 hour  Intake 230 ml  Output 400 ml  Net -170 ml   Filed Weights   12/15/18 0324  Weight: 78.6 kg    Examination:  General exam: awake, alert, no acute distress, frail Respiratory system: Clear bilaterally but decreased at bases, no wheezes, rales or rhonchi, normal respiratory effort. Cardiovascular system: normal S1/S2, bradycardic, no JVD, murmurs, rubs, gallops, no pedal edema.   Gastrointestinal system: soft, non-tender, non-distended abdomen, positive bowel sounds. Central nervous system: alert and oriented x4. no gross focal neurologic deficits, normal speech Extremities: Right lower extremity in brace, distal sensation intact and normal temperature Psychiatry: normal mood, congruent affect, judgement and insight appear normal    Data Reviewed: I have personally reviewed following labs and imaging studies  CBC: Recent Labs  Lab 12/14/18 1514  12/16/18 0421 12/17/18 0504 12/18/18 0405 12/19/18 0323  WBC 16.8* 10.3 11.0* 10.5 9.1  NEUTROABS 15.1*  --   --   --   --   HGB 13.9 12.5* 11.8* 10.6* 10.7*  HCT 43.6 37.0* 35.4* 32.7* 32.7*  MCV 100.5* 96.9 98.3 100.0 100.9*  PLT 203 172 181 164 0000000   Basic Metabolic Panel: Recent Labs  Lab 12/14/18 1514 12/16/18 0421 12/17/18 0504 12/18/18 0405 12/19/18 0323  NA 139 140 140 141 142  K 4.4 3.9 4.1 4.1 3.9  CL 105 109 108 109 111  CO2 21* 22 23 25 23   GLUCOSE 152* 102* 149* 104* 101*  BUN 42* 38* 35* 30* 31*  CREATININE 1.42* 1.12 1.04 0.99 0.87  CALCIUM 9.5 9.0 8.6* 8.5* 8.7*  MG  --  2.0 2.0  --  2.0   GFR: Estimated Creatinine Clearance: 60.1 mL/min (by C-G formula based on SCr of 0.87 mg/dL). Liver Function Tests: No results for input(s): AST, ALT, ALKPHOS, BILITOT, PROT, ALBUMIN in the last 168 hours. No results for input(s): LIPASE, AMYLASE in the last 168 hours. No results for input(s): AMMONIA in the last 168 hours. Coagulation Profile: Recent Labs  Lab 12/14/18 1656  INR 1.9*   Cardiac Enzymes: No results for input(s): CKTOTAL, CKMB, CKMBINDEX, TROPONINI in the last 168 hours. BNP (last 3 results) No results for input(s): PROBNP in the last 8760 hours. HbA1C: No results for input(s): HGBA1C in the last 72 hours. CBG: Recent Labs  Lab 12/19/18 0646  GLUCAP 97   Lipid Profile: No results for input(s): CHOL, HDL, LDLCALC, TRIG, CHOLHDL, LDLDIRECT in the last 72 hours. Thyroid Function Tests: No results  for input(s): TSH, T4TOTAL, FREET4, T3FREE, THYROIDAB in the last 72 hours. Anemia Panel: No results for input(s): VITAMINB12, FOLATE, FERRITIN, TIBC, IRON, RETICCTPCT in the last 72 hours. Sepsis Labs: No results for input(s): PROCALCITON, LATICACIDVEN in the last 168 hours.  Recent Results (from the past 240 hour(s))  SARS CORONAVIRUS 2 (TAT 6-24 HRS) Nasopharyngeal Nasopharyngeal Swab     Status: None   Collection Time: 12/14/18  9:39 PM    Specimen: Nasopharyngeal Swab  Result Value Ref Range Status   SARS Coronavirus 2 NEGATIVE NEGATIVE Final    Comment: (NOTE) SARS-CoV-2 target nucleic acids are NOT DETECTED. The SARS-CoV-2 RNA is generally detectable in upper and lower respiratory specimens during the acute phase of infection. Negative results do not preclude SARS-CoV-2 infection, do not rule out co-infections with other pathogens, and should not be used as the sole basis for treatment or other patient management decisions. Negative results must be combined with clinical observations, patient history, and epidemiological information. The expected result is Negative. Fact Sheet for Patients: SugarRoll.be Fact Sheet for Healthcare Providers: https://www.woods-mathews.com/ This test is not yet approved or cleared by the [place] FDA and  has been authorized for detection and/or diagnosis of SARS-CoV-2 by FDA under an Emergency Use Authorization (EUA). This EUA will remain  in effect (meaning this test can be used) for the duration of the COVID-19 declaration under Section 56 4(b)(1) of the Act, 21 U.S.C. section 360bbb-3(b)(1), unless the authorization is terminated or revoked sooner. Performed at [facility], [place] [address] [place], [place] [address]   Surgical PCR screen     Status: None   Collection Time: 12/15/18  2:04 AM   Specimen: Nasal Mucosa; Nasal Swab  Result Value Ref Range Status   MRSA, PCR NEGATIVE NEGATIVE Final   Staphylococcus aureus NEGATIVE NEGATIVE Final    Comment: (NOTE) The Xpert SA Assay (FDA approved for NASAL specimens in patients 26 years of age and older), is one component of a comprehensive surveillance program. It is not intended to diagnose infection nor to guide or monitor treatment. Performed at [facility], [place] [address] [place], [place] [address]          Radiology Studies: No results found.       Scheduled Meds: . acetaminophen  500 mg Oral Q8H  . carbidopa-levodopa  1 tablet Oral TID  . docusate sodium  100 mg Oral BID  . mupirocin ointment  1 application Nasal BID  . pneumococcal 23 valent vaccine  0.5 mL Intramuscular Tomorrow-1000  . rivaroxaban  20 mg Oral Q supper   Continuous Infusions: . sodium chloride 75 mL/hr at 12/17/18 1710  . lactated ringers       LOS: 5 days    Time spent: 25-30 min    Ezekiel Slocumb, DO [facility] Pager: [phone]  If 7PM-7AM, please contact night-coverage [email or web address] Password [facility] 12/19/2018, 8:13 AM

## 2018-12-19 NOTE — Consult Note (Signed)
   [facility] CM Inpatient Consult   12/19/2018  Chad Russell 31-Mar-1926 [id number]    Patientreviewed for potential [facility] care management services needed as benefit fromhis [facility] Medicare plan, with15%risk scorefor unplanned readmission and hospitalization.   Patient was previously outreached by [facility] care management coordinators in distant past.  Our community based plan of care has focused on disease management and community resource support.  Chart review andMD brief narrative 12/18/18 showas: 85 y.o.malewith medical history significant of Parkinson's disease, complete heart block(x20 years with no indication for pacemaker), CAD, atrial fibrillation on Xarelto, hypertension, OSA not on CPAP.    He presented on 10/22 withleft hip pain status post a mechanicalfall the night before.He reported a slip in the foyer of his home when not using his walker.    Patient underwentleft hip hemiarthroplasty on 10/24.Patient evaluated by PT/OT who recommended comprehensive inpatient rehab upon discharge due to greatly decreased functional mobility and activity tolerance.  Primary care provider isDr. [name] with [facility], listed to provide transition of care.  Brief chartreviewrevealsPT/ OT & ST notes recommending patient for CIR([facility]).  Will followforprogress anddisposition, and ifthere are any changesin dispositionand needs for appropriate community follow-up,please referpatientto [facility] caremanagement.  Of note, [facility] Care Management services does not replace or interfere with any servicesarranged by transition of care CM or social work.   Forquestions and additional information, pleasecontact:  Brenton Joines A. Keyshun Elpers, BSN, RN-BC [facility] Liaison Cell: [phone]

## 2018-12-19 NOTE — Progress Notes (Signed)
Physical Therapy Treatment Patient Details Name: Chad Russell MRN: [id number] DOB: 11-Jun-1926 Today's Date: 12/19/2018    History of Present Illness 85 y.o. male with medical history significant of Parkinson's disease, complete heart block (x 20 years with no indication for pacemaker), CAD, atrial fibrillation on Xarelto, hypertension, OSA not on CPAP.  He presented on 10/22 with left hip pain status post a mechanical fall the night before.  He reported a slip in the foyer of his home when not using his walker, resulting in left femoral neck fracture. Now, s/p L hip hemiarthroplasty, posterior hip precautions, WBAT    PT Comments    Continuing work on functional mobility and activity tolerance;  Session focused on therapeutic exercise, transfers, and initiating gait training; Very motivated to work, and improve functionally; Stood x3, twice with stedy and once to RW from recliner; all requiring moderate assist; Able to initiate taking steps with RW and +2 assist and reclienr pushed behind for safety; Continue to recommend comprehensive inpatient rehab (CIR) for post-acute therapy needs.   Follow Up Recommendations  CIR;Supervision/Assistance - 24 hour     Equipment Recommendations  None recommended by PT    Recommendations for Other Services Rehab consult     Precautions / Restrictions Precautions Precautions: Fall;Posterior Hip Precaution Booklet Issued: Yes (comment) Precaution Comments: Able to state 1/3 precautions; reinstructed Restrictions LLE Weight Bearing: Weight bearing as tolerated    Mobility  Bed Mobility Overal bed mobility: Needs Assistance Bed Mobility: Supine to Sit     Supine to sit: Max assist;+2 for physical assistance     General bed mobility comments: Smoother motion than last session; mod assist to half-bridge to EOB with close guard to prevent interanl rotation and adduction; Lighter, but still max assist to elevate trunk to sit; good reach for  bedrail to help self up  Transfers Overall transfer level: Needs assistance Equipment used: Rolling walker (2 wheeled) Transfers: Sit to/from Stand Sit to Stand: Mod assist;+2 safety/equipment         General transfer comment: Stood from bed to stedy with Mod assist of 2; Employed stedy to get to recliner; then stood from recliner to [place]; assist to position hips with pad to edge of chair, assist to rise  Ambulation/Gait Ambulation/Gait assistance: Mod assist;Max assist;+2 physical assistance;+2 safety/equipment Gait Distance (Feet): 2 Feet(approx 6 steps) Assistive device: Rolling walker (2 wheeled) Gait Pattern/deviations: Decreased step length - right;Decreased stance time - left     General Gait Details: Multimodal cueing to stand up straight with knees extended in standing; facilitated weight shifting onto stance Leg (both LEs, needing more assist to shift onto painful LLE) to allow for unweihging and advancement of swing LE   Stairs             Wheelchair Mobility    Modified Rankin (Stroke Patients Only)       Balance                                            Cognition Arousal/Alertness: Awake/alert Behavior During Therapy: WFL for tasks assessed/performed Overall Cognitive Status: Within Functional Limits for tasks assessed                                        Exercises Total Joint Exercises Ankle  Circles/Pumps: AROM;Right;Left;10 reps(with added dorsiflexion stretching) Quad Sets: AROM;Right;Left;10 reps Short Arc Quad: AROM;Right;Left;10 reps Heel Slides: AROM;Right;Left;10 reps Hip ABduction/ADduction: AROM;Right;Left;10 reps    General Comments General comments (skin integrity, edema, etc.): Session conducted without supplemental O2, and O2 sats remained greater than or equal to 90%      Pertinent Vitals/Pain Pain Assessment: Faces Faces Pain Scale: Hurts little more Pain Location: left hip Pain  Descriptors / Indicators: Grimacing;Guarding Pain Intervention(s): Monitored during session    Home Living                      Prior Function            PT Goals (current goals can now be found in the care plan section) Acute Rehab PT Goals Patient Stated Goal: go to rehab to regain strength, and get home PT Goal Formulation: With patient Time For Goal Achievement: 12/31/18 Potential to Achieve Goals: Fair Progress towards PT goals: Progressing toward goals    Frequency    Min 5X/week      PT Plan Current plan remains appropriate    Co-evaluation              AM-PAC PT "6 Clicks" Mobility   Outcome Measure  Help needed turning from your back to your side while in a flat bed without using bedrails?: A Lot Help needed moving from lying on your back to sitting on the side of a flat bed without using bedrails?: A Lot Help needed moving to and from a bed to a chair (including a wheelchair)?: A Lot Help needed standing up from a chair using your arms (e.g., wheelchair or bedside chair)?: A Lot Help needed to walk in hospital room?: A Lot Help needed climbing 3-5 steps with a railing? : Total 6 Click Score: 11    End of Session Equipment Utilized During Treatment: Gait belt Activity Tolerance: Patient tolerated treatment well Patient left: in chair;with call bell/phone within reach;with chair alarm set Nurse Communication: Mobility status;Other (comment);Need for lift equipment(IV status) PT Visit Diagnosis: Unsteadiness on feet (R26.81);Other abnormalities of gait and mobility (R26.89);Repeated falls (R29.6);Muscle weakness (generalized) (M62.81);History of falling (Z91.81)     Time: [id number] PT student related activities) PT Time Calculation (min) (ACUTE ONLY): 69 min  Charges:  $Gait Training: 8-22 mins $Therapeutic Exercise: 8-22 mins $Therapeutic Activity: 23-37 mins                     Roney Marion, PT  Acute Rehabilitation  Services Pager [phone] Office [place] 12/19/2018, 6:19 PM

## 2018-12-19 NOTE — Consult Note (Signed)
Inpatient Rehabilitation Admissions Coordinator  Inpatient rehab consult received. I met with patient and his wife at bedside for rehab assessment. I discussed goals and expectations of an inpt rehab admit.t They refuse SNF. Were at [facility] 03/2017 with right hip fx and were there 3 weeks before return home. They do not want SNF due to [place]. I will begin insurance authorization with [facility] and follow up tomorrow for admit penidng insurance approval.  Danne Baxter, RN, MSN Rehab Admissions Coordinator [phone] 12/19/2018 1:09 PM

## 2018-12-20 LAB — CBC
HCT: 31.2 % — ABNORMAL LOW (ref 39.0–52.0)
Hemoglobin: 10.1 g/dL — ABNORMAL LOW (ref 13.0–17.0)
MCH: 32.5 pg (ref 26.0–34.0)
MCHC: 32.4 g/dL (ref 30.0–36.0)
MCV: 100.3 fL — ABNORMAL HIGH (ref 80.0–100.0)
Platelets: 197 10*3/uL (ref 150–400)
RBC: 3.11 MIL/uL — ABNORMAL LOW (ref 4.22–5.81)
RDW: 14.1 % (ref 11.5–15.5)
WBC: 9 10*3/uL (ref 4.0–10.5)
nRBC: 0 % (ref 0.0–0.2)

## 2018-12-20 MED ORDER — LISINOPRIL 10 MG PO TABS
10.0000 mg | ORAL_TABLET | Freq: Every day | ORAL | Status: DC
  Administered 2018-12-21 – 2018-12-22 (×2): 10 mg via ORAL
  Filled 2018-12-20 (×2): qty 1

## 2018-12-20 NOTE — Progress Notes (Signed)
Physical Therapy Treatment Patient Details Name: Chad Russell MRN: [id number] DOB: 1926-03-23 Today's Date: 12/20/2018    History of Present Illness 85 y.o. male with medical history significant of Parkinson's disease, complete heart block (x 20 years with no indication for pacemaker), CAD, atrial fibrillation on Xarelto, hypertension, OSA not on CPAP.  He presented on 10/22 with left hip pain status post a mechanical fall the night before.  He reported a slip in the foyer of his home when not using his walker, resulting in left femoral neck fracture. Now, s/p L hip hemiarthroplasty, posterior hip precautions, WBAT    PT Comments    Continuing work on functional mobility and activity tolerance;  Making slow, but steady progress; Moving L hip through ROM and therapeutic exercises with less help; incrementally increasing amb distance with notably increased step length with gait training today;   Noted Insurance denial of CIR, and pt and wife declining SNF stay for post-acute rehab; during the PT session today, it seemed that they were softening towards SNF, but it sounds like per [facility], they are back to refusing SNF;   To be clear: PT is strongly recommending post-acute rehabilitation to maximize independence and safety with mobility, decr fall risk, prior to going home    Follow Up Recommendations  Post-acute rehabilitation;Supervision/Assistance - 24 hour  If SNF for rehab is not an option, for home recommend:  24 hour moderate to maximal assist,   Wheelchair,   Hospital bed,   Non-emergent ambulance transport home,   HHPT/OT/Aide/SW/RN     Equipment Recommendations  See above   Recommendations for Other Services       Precautions / Restrictions Precautions Precautions: Fall;Posterior Hip Precaution Booklet Issued: Yes (comment) Precaution Comments: Able to state 1/3 precautions; reinstructed Restrictions LLE Weight Bearing: Weight bearing as tolerated     Mobility  Bed Mobility Overal bed mobility: Needs Assistance Bed Mobility: Supine to Sit     Supine to sit: Max assist;+2 for physical assistance     General bed mobility comments: Mod assist to half-bridge to EOB with close guard to prevent interanl rotation and adduction; Lighter, but still max assist to elevate trunk to sit; good reach for bedrail to help self up  Transfers Overall transfer level: Needs assistance Equipment used: Rolling walker (2 wheeled) Transfers: Sit to/from Stand Sit to Stand: Mod assist;+2 safety/equipment         General transfer comment: Cues for technique, hand placement, and close guard for posterior hip prec; Heavy mod assist to pwer up to sit  Ambulation/Gait Ambulation/Gait assistance: Mod assist;+2 physical assistance;+2 safety/equipment Gait Distance (Feet): 6 Feet Assistive device: Rolling walker (2 wheeled) Gait Pattern/deviations: Decreased step length - right;Decreased stance time - left     General Gait Details: Multimodal cueing to stand up straight with knees extended in standing; facilitated weight shifting onto stance Leg (both LEs, needing more assist to shift onto painful LLE) to allow for unweihging and advancement of swing LE   Stairs             Wheelchair Mobility    Modified Rankin (Stroke Patients Only)       Balance     Sitting balance-Leahy Scale: Fair       Standing balance-Leahy Scale: Poor                              Cognition Arousal/Alertness: Awake/alert Behavior During Therapy: WFL for tasks assessed/performed  Overall Cognitive Status: Within Functional Limits for tasks assessed                                        Exercises Total Joint Exercises Ankle Circles/Pumps: AROM;Both;10 reps Quad Sets: AROM;Right;Left;10 reps Gluteal Sets: AROM;Both;10 reps Towel Squeeze: AROM;Both;10 reps Heel Slides: AROM;Right;Left;10 reps Hip ABduction/ADduction:  AROM;Right;Left;10 reps    General Comments        Pertinent Vitals/Pain Pain Assessment: Faces Faces Pain Scale: Hurts a little bit Pain Location: left hip Pain Descriptors / Indicators: Grimacing;Guarding Pain Intervention(s): Monitored during session    Home Living                      Prior Function            PT Goals (current goals can now be found in the care plan section) Acute Rehab PT Goals Patient Stated Goal: go to rehab to regain strength, and get home PT Goal Formulation: With patient Time For Goal Achievement: 12/31/18 Potential to Achieve Goals: Fair Progress towards PT goals: Progressing toward goals    Frequency    Min 5X/week      PT Plan Current plan remains appropriate    Co-evaluation              AM-PAC PT "6 Clicks" Mobility   Outcome Measure  Help needed turning from your back to your side while in a flat bed without using bedrails?: A Lot Help needed moving from lying on your back to sitting on the side of a flat bed without using bedrails?: A Lot Help needed moving to and from a bed to a chair (including a wheelchair)?: A Lot Help needed standing up from a chair using your arms (e.g., wheelchair or bedside chair)?: A Lot Help needed to walk in hospital room?: A Lot Help needed climbing 3-5 steps with a railing? : Total 6 Click Score: 11    End of Session Equipment Utilized During Treatment: Gait belt Activity Tolerance: Patient tolerated treatment well Patient left: in chair;with call bell/phone within reach;with chair alarm set Nurse Communication: Mobility status PT Visit Diagnosis: Unsteadiness on feet (R26.81);Other abnormalities of gait and mobility (R26.89);Repeated falls (R29.6);Muscle weakness (generalized) (M62.81);History of falling (Z91.81)     Time: 1530-1610 PT Time Calculation (min) (ACUTE ONLY): 40 min  Charges:  $Gait Training: 8-22 mins $Therapeutic Exercise: 8-22 mins $Therapeutic Activity:  8-22 mins                     Roney Marion, PT  Acute Rehabilitation Services Pager [phone] Office [place] 12/20/2018, 6:17 PM

## 2018-12-20 NOTE — Progress Notes (Signed)
PROGRESS NOTE    Chad Russell   [id number]  DOB: September 07, 1926  DOA: 12/14/2018 PCP: [name], MD   Brief Narrative:  Chad Russell 85 y.o.malewith medical history significant of Parkinson's disease, complete heart block(x20 years with no indication for pacemaker), CAD, atrial fibrillation on Xarelto, hypertension, OSA not on CPAP. He presents with left hip pain status post mechanical fall the night before.  Found to have a left femoral neck fracture and underwent left hip hemiarthroplasty on 10/24.   Subjective: The patient is resting comfortably and has no complaints.    Assessment & Plan:   Principal Problem:   Fracture of femoral neck, left  -Status post left hip arthroplasty on 10/24 -Xarelto has been resumed  Active Problems: Acute blood loss anemia -Hemoglobin is stable around 10 and is not dropping further therefore hold off on transfusion    Atrial fibrillation -Continue Xarelto  Hypertension -Resume lisinopril -Continue to hold HCTZ    AKI (acute kidney injury) -Fattening was 1.42 on admission and has improved to 0.87   Time spent in minutes: 35 DVT prophylaxis: Xarelto Code Status: Full Code Family Communication:  Disposition Plan:  Consultants:   Ortho Procedures:   Left hip arthroplasty Antimicrobials:  Anti-infectives (From admission, onward)   Start     Dose/Rate Route Frequency Ordered Stop   12/16/18 1400  ceFAZolin (ANCEF) IVPB 2g/100 mL premix    Note to Pharmacy: Tolerated ancef in OR   2 g 200 mL/hr over 30 Minutes Intravenous Every 6 hours 12/16/18 1201 12/16/18 2027   12/16/18 0802  ceFAZolin (ANCEF) 2-4 GM/100ML-% IVPB    Note to Pharmacy: Rejeana Brock   : cabinet override      12/16/18 0802 12/16/18 2014       Objective: Vitals:   12/19/18 1951 12/20/18 0526 12/20/18 0802 12/20/18 1531  BP: (!) 146/67 135/69 (!) 145/77 140/67  Pulse: (!) 35 (!) 33 (!) 34 (!) 35  Resp: 18 17 14 18   Temp: 97.6 F  (36.4 C) 98.2 F (36.8 C) 97.7 F (36.5 C) 98.1 F (36.7 C)  TempSrc: Oral  Oral Oral  SpO2: 97% 93% 96% 95%  Weight:      Height:        Intake/Output Summary (Last 24 hours) at 12/20/2018 1638 Last data filed at 12/20/2018 1300 Gross per 24 hour  Intake 4824.43 ml  Output -  Net 4824.43 ml   Filed Weights   12/15/18 0324  Weight: 78.6 kg    Examination: General exam: Appears comfortable  HEENT: PERRLA, oral mucosa moist, no sclera icterus or thrush Respiratory system: Clear to auscultation. Respiratory effort normal. Cardiovascular system: S1 & S2 heard, RRR.   Gastrointestinal system: Abdomen soft, non-tender, nondistended. Normal bowel sounds. Central nervous system: Alert and oriented. No focal neurological deficits. Extremities: No cyanosis, clubbing - left hip noted to be swollen Skin: No rashes or ulcers Psychiatry:  Mood & affect appropriate.     Data Reviewed: I have personally reviewed following labs and imaging studies  CBC: Recent Labs  Lab 12/14/18 1514 12/16/18 0421 12/17/18 0504 12/18/18 0405 12/19/18 0323 12/20/18 0206  WBC 16.8* 10.3 11.0* 10.5 9.1 9.0  NEUTROABS 15.1*  --   --   --   --   --   HGB 13.9 12.5* 11.8* 10.6* 10.7* 10.1*  HCT 43.6 37.0* 35.4* 32.7* 32.7* 31.2*  MCV 100.5* 96.9 98.3 100.0 100.9* 100.3*  PLT 203 172 181 164 195 [id number]   Basic Metabolic  Panel: Recent Labs  Lab 12/14/18 1514 12/16/18 0421 12/17/18 0504 12/18/18 0405 12/19/18 0323  NA 139 140 140 141 142  K 4.4 3.9 4.1 4.1 3.9  CL 105 109 108 109 111  CO2 21* 22 23 25 23   GLUCOSE 152* 102* 149* 104* 101*  BUN 42* 38* 35* 30* 31*  CREATININE 1.42* 1.12 1.04 0.99 0.87  CALCIUM 9.5 9.0 8.6* 8.5* 8.7*  MG  --  2.0 2.0  --  2.0   GFR: Estimated Creatinine Clearance: 60.1 mL/min (by C-G formula based on SCr of 0.87 mg/dL). Liver Function Tests: No results for input(s): AST, ALT, ALKPHOS, BILITOT, PROT, ALBUMIN in the last 168 hours. No results for input(s):  LIPASE, AMYLASE in the last 168 hours. No results for input(s): AMMONIA in the last 168 hours. Coagulation Profile: Recent Labs  Lab 12/14/18 1656  INR 1.9*   Cardiac Enzymes: No results for input(s): CKTOTAL, CKMB, CKMBINDEX, TROPONINI in the last 168 hours. BNP (last 3 results) No results for input(s): PROBNP in the last 8760 hours. HbA1C: No results for input(s): HGBA1C in the last 72 hours. CBG: Recent Labs  Lab 12/19/18 0646  GLUCAP 97   Lipid Profile: No results for input(s): CHOL, HDL, LDLCALC, TRIG, CHOLHDL, LDLDIRECT in the last 72 hours. Thyroid Function Tests: No results for input(s): TSH, T4TOTAL, FREET4, T3FREE, THYROIDAB in the last 72 hours. Anemia Panel: No results for input(s): VITAMINB12, FOLATE, FERRITIN, TIBC, IRON, RETICCTPCT in the last 72 hours. Urine analysis:    Component Value Date/Time   COLORURINE YELLOW 04/19/2017 [place] 04/19/2017 1737   LABSPEC 1.025 04/19/2017 1737   PHURINE 5.0 04/19/2017 1737   GLUCOSEU NEGATIVE 04/19/2017 1737   HGBUR NEGATIVE 04/19/2017 1737   BILIRUBINUR negative 04/01/2018 1148   KETONESUR negative 03/28/2018 1116   KETONESUR 20 (A) 04/19/2017 1737   PROTEINUR Positive (A) 04/01/2018 1148   PROTEINUR NEGATIVE 04/19/2017 1737   UROBILINOGEN 0.2 04/01/2018 1148   UROBILINOGEN 1.0 04/27/2012 0019   NITRITE negative 04/01/2018 1148   NITRITE NEGATIVE 04/19/2017 1737   LEUKOCYTESUR Trace (A) 04/01/2018 1148   Sepsis Labs: @LABRCNTIP (procalcitonin:4,lacticidven:4) ) Recent Results (from the past 240 hour(s))  SARS CORONAVIRUS 2 (TAT 6-24 HRS) Nasopharyngeal Nasopharyngeal Swab     Status: None   Collection Time: 12/14/18  9:39 PM   Specimen: Nasopharyngeal Swab  Result Value Ref Range Status   SARS Coronavirus 2 NEGATIVE NEGATIVE Final    Comment: (NOTE) SARS-CoV-2 target nucleic acids are NOT DETECTED. The SARS-CoV-2 RNA is generally detectable in upper and lower respiratory specimens during  the acute phase of infection. Negative results do not preclude SARS-CoV-2 infection, do not rule out co-infections with other pathogens, and should not be used as the sole basis for treatment or other patient management decisions. Negative results must be combined with clinical observations, patient history, and epidemiological information. The expected result is Negative. Fact Sheet for Patients: SugarRoll.be Fact Sheet for Healthcare Providers: https://www.woods-mathews.com/ This test is not yet approved or cleared by the [place] FDA and  has been authorized for detection and/or diagnosis of SARS-CoV-2 by FDA under an Emergency Use Authorization (EUA). This EUA will remain  in effect (meaning this test can be used) for the duration of the COVID-19 declaration under Section 56 4(b)(1) of the Act, 21 U.S.C. section 360bbb-3(b)(1), unless the authorization is terminated or revoked sooner. Performed at [facility], [place] [address], [place], [place] [address]   Surgical PCR screen  Status: None   Collection Time: 12/15/18  2:04 AM   Specimen: Nasal Mucosa; Nasal Swab  Result Value Ref Range Status   MRSA, PCR NEGATIVE NEGATIVE Final   Staphylococcus aureus NEGATIVE NEGATIVE Final    Comment: (NOTE) The Xpert SA Assay (FDA approved for NASAL specimens in patients 37 years of age and older), is one component of a comprehensive surveillance program. It is not intended to diagnose infection nor to guide or monitor treatment. Performed at [facility], [place] [address], [place], [place] [address]          Radiology Studies: No results found.    Scheduled Meds: . acetaminophen  500 mg Oral Q8H  . carbidopa-levodopa  1 tablet Oral TID  . docusate sodium  100 mg Oral BID  . feeding supplement (ENSURE ENLIVE)  237 mL Oral BID BM  . pneumococcal 23 valent vaccine  0.5 mL Intramuscular Tomorrow-1000  . rivaroxaban  20  mg Oral Q supper   Continuous Infusions:   LOS: 6 days      Debbe Odea, MD [facility] Pager: [email or web address] Password [facility] 12/20/2018, 4:38 PM

## 2018-12-20 NOTE — Progress Notes (Signed)
Orthopaedic Trauma Service Progress Note  Patient ID: Chad Russell MRN: [id number] DOB/AGE: Oct 04, 1926 85 y.o.  Subjective:  Doing well Feeling more confident with therapy and mobilizing  Awaiting word from insurance to see if CIR will be approved    ROS As above  Objective:   VITALS:   Vitals:   12/19/18 1600 12/19/18 1951 12/20/18 0526 12/20/18 0802  BP:  (!) 146/67 135/69 (!) 145/77  Pulse:  (!) 35 (!) 33 (!) 34  Resp:  18 17 14   Temp:  97.6 F (36.4 C) 98.2 F (36.8 C) 97.7 F (36.5 C)  TempSrc:  Oral  Oral  SpO2: 97% 97% 93% 96%  Weight:      Height:        Estimated body mass index is 24.18 kg/m as calculated from the following:   Height as of this encounter: 5\' 11"  (1.803 m).   Weight as of this encounter: 78.6 kg.   Intake/Output      10/27 0701 - 10/28 0700 10/28 0701 - 10/29 0700   P.O. 600    Total Intake(mL/kg) 600 (7.6)    Urine (mL/kg/hr)     Total Output     Net +600         Urine Occurrence 2 x    Stool Occurrence 2 x      LABS  Results for orders placed or performed during the hospital encounter of 12/14/18 (from the past 24 hour(s))  CBC     Status: Abnormal   Collection Time: 12/20/18  2:06 AM  Result Value Ref Range   WBC 9.0 4.0 - 10.5 K/uL   RBC 3.11 (L) 4.22 - 5.81 MIL/uL   Hemoglobin 10.1 (L) 13.0 - 17.0 g/dL   HCT 31.2 (L) 39.0 - 52.0 %   MCV 100.3 (H) 80.0 - 100.0 fL   MCH 32.5 26.0 - 34.0 pg   MCHC 32.4 30.0 - 36.0 g/dL   RDW 14.1 11.5 - 15.5 %   Platelets 197 150 - 400 K/uL   nRBC 0.0 0.0 - 0.2 %     PHYSICAL EXAM:   Gen: resting comfortably in bed, working on breakfast  Lungs: unlabored Cardiac: reg Ext:       Left lower extremity   Dressings c/d/i  Ext warm   Swelling controlled  No DCT  Compartments soft  Distal motor and sensory functions intact   Assessment/Plan: 4 Days Post-Op   Principal Problem:   Fracture of  femoral neck, left (HCC) Active Problems:   Complete heart block (HCC)   HTN (hypertension)   Atrial fibrillation (HCC)   BPH (benign prostatic hyperplasia)   AKI (acute kidney injury) ([place])   Dementia due to Parkinson's disease without behavioral disturbance (HCC)   Anti-infectives (From admission, onward)   Start     Dose/Rate Route Frequency Ordered Stop   12/16/18 1400  ceFAZolin (ANCEF) IVPB 2g/100 mL premix    Note to Pharmacy: Tolerated ancef in OR   2 g 200 mL/hr over 30 Minutes Intravenous Every 6 hours 12/16/18 1201 12/16/18 2027   12/16/18 0802  ceFAZolin (ANCEF) 2-4 GM/100ML-% IVPB    Note to Pharmacy: Rejeana Brock   : cabinet override      12/16/18 0802 12/16/18 2014    .  POD/HD#: 4  85 y/o male s/p fall with L femoral neck fracture   -displaced L femoral neck fracture s/p left hip hemiarthroplasty             Weight-bear as tolerated             Posterior hip precautions             PT and OT             Ice             Dressing changes as needed   Ok to shower and clean wounds with soap and water only    Ok to leave wound open to air    Sutures can be removed POD #14   - Pain management:             Continue with current regimen                          Scheduled tylenol                          norco as needed    - ABL anemia/Hemodynamics             stable     - Medical issues              Per primary    - DVT/PE prophylaxis:             On Xarelto chronically for A. Fib             Restarted on postop day 1    - ID:              periop abx completed    - Metabolic Bone Disease:             Vitamin D levels look outstanding                         Continue with home regimen               2nd hip fracture in a year and half             Suggestive of very poor bone quality      - FEN/GI prophylaxis/Foley/Lines:             Diet as tolerated    - Dispo:             awaiting word from insurance company about CIR   Ortho issues  stable   He may go to CIR today if approved and bed available   Jari Pigg, PA-C [phone] (C) 12/20/2018, 8:18 AM  [facility] [place] [address] [phone] Jenetta [phone] (F)   After 6pm on weekdays please call office number to get in touch with on call provider or refer to [place] and look to see who is on call for the Sports Medicine Call Group which is listed under orthopaedics   On Weekends please call office number to get in touch with on call provider or refer to [place] and look to see who is on call for the Sports Medicine Call Group which is listed under orthopaedics

## 2018-12-20 NOTE — Progress Notes (Addendum)
Inpatient Rehabilitation Admissions Coordinator  I have received an initial denial to admit patient to CIR/inpt rehab from [employer]. I met with patient and his wife at bedside and they are aware. I have notified Dr. Rizwan of the option for a peer to peer appeal to [employer] MD that is available. I await that clarification if Rizwan or orthopedic MD would like to pursue and then will follow up with patient and his wife.   , RN, MSN Rehab Admissions Coordinator [phone] 12/20/2018 12:24 PM    I spoke with patient and wife again at bedside. Their is not grounds for peer to peer appeal per Dr. Rizwan or Keith Paul PA. I recommended SNF but patient refusing and wife asking to d/c home. He will need hospital bed, max support at home and 24/7 hired caregivers which I explained to the pt and his wife. RN CM, Natalie made aware. We will sign off at this time.   , RN, MSN Rehab Admissions Coordinator [phone] 12/20/2018 4:32 PM   

## 2018-12-20 NOTE — Plan of Care (Signed)
?  Problem: Clinical Measurements: ?Goal: Respiratory complications will improve ?Outcome: Progressing ?  ?Problem: Activity: ?Goal: Risk for activity intolerance will decrease ?Outcome: Progressing ?  ?Problem: Nutrition: ?Goal: Adequate nutrition will be maintained ?Outcome: Progressing ?  ?Problem: Coping: ?Goal: Level of anxiety will decrease ?Outcome: Progressing ?  ?

## 2018-12-20 NOTE — TOC Initial Note (Signed)
Transition of Care [facility]) - Initial/Assessment Note    Patient Details  Name: Chad Russell MRN: [id number] Date of Birth: [birth year]  Transition of [facility]) CM/SW Contact:    Midge Minium RN, BSN, NCM-BC, ACM-RN [phone] (working remotely) Phone Number: 12/20/2018, 8:33 AM  Clinical Narrative:                 CM following for dispositional needs. Patient is a 85 y.o. male who presented on 10/22 with left hip pain status post a mechanical fall; now s/p L hip hemiarthroplasty. Patient lived at home with his spouse PTA, ambulated with DME with assistance provided by spouse. PT/OT eval completed with CIR recommended with the patient agreeable; patient/spouse are not agreeable to SNF placement d/t COVID. [facility] following for appropriateness with insurance auth pending for CIR. CM team will continue to follow.   Expected Discharge Plan: IP Rehab Facility Barriers to Discharge: Insurance Authorization   Patient Goals and CMS Choice Patient states their goals for this hospitalization and ongoing recovery are:: "go to rehab to regain strength, and get home" CMS Medicare.gov Compare Post Acute Care list provided to:: Other (Comment Required)(Followed by [facility]hab [facility]) Choice offered to / list presented to : Patient(Followed by Rehab [facility])  Expected Discharge Plan and Services Expected Discharge Plan: [place] In-house Referral: NA Discharge Planning Services: CM Consult Post Acute Care Choice: IP Rehab Living arrangements for the past 2 months: Single Family Home                  Prior Living Arrangements/Services Living arrangements for the past 2 months: Single Family Home Lives with:: Self, Spouse              Current home services: DME    Activities of Daily Living Home Assistive Devices/Equipment: Environmental consultant (specify type) ADL Screening (condition at time of admission) Patient's cognitive ability adequate to safely complete daily activities?: Yes Is the patient deaf  or have difficulty hearing?: Yes Does the patient have difficulty seeing, even when wearing glasses/contacts?: No Does the patient have difficulty concentrating, remembering, or making decisions?: Yes Patient able to express need for assistance with ADLs?: Yes Does the patient have difficulty dressing or bathing?: No Independently performs ADLs?: Yes (appropriate for developmental age) Does the patient have difficulty walking or climbing stairs?: Yes Weakness of Legs: Both Weakness of Arms/Hands: None   Admission diagnosis:  Hematoma of scalp, initial encounter [S00.03XA] Pain of left lower extremity [M79.605] Patient Active Problem List   Diagnosis Date Noted  . AKI (acute kidney injury) ([place]) 12/15/2018  . Dementia due to Parkinson's disease without behavioral disturbance ([place]) 12/15/2018  . Hematoma of scalp   . Heart block   . Chest pain 05/04/2018  . Fracture of femoral neck, left ([place]) 04/19/2017  . MGUS (monoclonal gammopathy of unknown significance) 01/15/2017  . BPH with obstruction/lower urinary tract symptoms 09/07/2016  . Preoperative cardiovascular examination 08/24/2016  . Urinary retention 05/13/2016  . Balance problem 10/31/2013  . OSA (obstructive sleep apnea) 10/31/2013  . BPH (benign prostatic hyperplasia) 08/28/2012  . Atrial fibrillation ([place]) 06/08/2012  . Hyperplasia of prostate with lower urinary tract symptoms (LUTS) 04/28/2012  . Moderate malnutrition ([place]) 04/28/2012  . Infection of urinary tract - recurrent 04/26/2012  . NSTEMI (non-ST elevated myocardial infarction) ([place]) 03/15/2011  . Complete heart block ([place]) 03/15/2011  . HTN (hypertension) 03/15/2011   PCP:  [name], MD Pharmacy:   [facility] [address] [place] -  [place] [address] Phone: [phone] Fax: [phone]     Social Determinants of Health (SDOH) Interventions    Readmission Risk Interventions No flowsheet  data found.

## 2018-12-21 LAB — SARS CORONAVIRUS 2 (TAT 6-24 HRS): SARS Coronavirus 2: NEGATIVE

## 2018-12-21 NOTE — Progress Notes (Signed)
PROGRESS NOTE    MINDY VERZOSA   [id number]  DOB: [birth year]  DOA: 12/14/2018 PCP: [name], MD   Brief Narrative:  [name] 85 y.o.malewith medical history significant of Parkinson's disease, complete heart block(x20 years with no indication for pacemaker), CAD, atrial fibrillation on Xarelto, hypertension, OSA not on CPAP. He presents with left hip pain status post mechanical fall the night before.  Found to have a left femoral neck fracture and underwent left hip hemiarthroplasty on 10/24.   Subjective: The patient is resting comfortably and has no complaints.    Assessment & Plan:   Principal Problem:   Fracture of femoral neck, left  -Status post left hip arthroplasty on 10/24 -Xarelto has been resumed  - sutures to be removed on POD 14  Active Problems: Acute blood loss anemia -Hemoglobin is stable around 10 and is not dropping further therefore hold off on transfusion    Atrial fibrillation -Continue Xarelto  Hypertension -Resume lisinopril -Continue to hold HCTZ    AKI (acute kidney injury) -presented was 1.42 on admission and has improved to 0.87   Time spent in minutes: 35 DVT prophylaxis: Xarelto Code Status: Full Code Family Communication:  Disposition Plan: SNF Consultants:   Ortho Procedures:   Left hip arthroplasty Antimicrobials:  Anti-infectives (From admission, onward)   Start     Dose/Rate Route Frequency Ordered Stop   12/16/18 1400  ceFAZolin (ANCEF) IVPB 2g/100 mL premix    Note to Pharmacy: Tolerated ancef in OR   2 g 200 mL/hr over 30 Minutes Intravenous Every 6 hours 12/16/18 1201 12/16/18 2027   12/16/18 0802  ceFAZolin (ANCEF) 2-4 GM/100ML-% IVPB    Note to Pharmacy: Rejeana Brock   : cabinet override      12/16/18 0802 12/16/18 2014       Objective: Vitals:   12/20/18 1958 12/21/18 0806 12/21/18 0956 12/21/18 1638  BP: (!) 160/74 (!) 163/71 (!) 163/71 (!) 157/74  Pulse: (!) 33 88  (!) 35   Resp: 16 17  14   Temp: 97.7 F (36.5 C) 98.2 F (36.8 C)  98 F (36.7 C)  TempSrc: Oral Oral  Oral  SpO2: 100% 100%  97%  Weight:      Height:        Intake/Output Summary (Last 24 hours) at 12/21/2018 1639 Last data filed at 12/21/2018 1300 Gross per 24 hour  Intake 240 ml  Output 200 ml  Net 40 ml   Filed Weights   12/15/18 0324  Weight: 78.6 kg    Examination: General exam: Appears comfortable  HEENT: PERRLA, oral mucosa moist, no sclera icterus or thrush Respiratory system: Clear to auscultation. Respiratory effort normal. Cardiovascular system: S1 & S2 heard,  No murmurs  Gastrointestinal system: Abdomen soft, non-tender, nondistended. Normal bowel sounds   Central nervous system: Alert and oriented. No focal neurological deficits. Extremities: No cyanosis, clubbing or edema Skin: No rashes or ulcers Psychiatry:  Mood & affect appropriate.      Data Reviewed: I have personally reviewed following labs and imaging studies  CBC: Recent Labs  Lab 12/16/18 0421 12/17/18 0504 12/18/18 0405 12/19/18 0323 12/20/18 0206  WBC 10.3 11.0* 10.5 9.1 9.0  HGB 12.5* 11.8* 10.6* 10.7* 10.1*  HCT 37.0* 35.4* 32.7* 32.7* 31.2*  MCV 96.9 98.3 100.0 100.9* 100.3*  PLT 172 181 164 195 [id number]   Basic Metabolic Panel: Recent Labs  Lab 12/16/18 0421 12/17/18 0504 12/18/18 0405 12/19/18 0323  NA 140 140 141 142  K 3.9 4.1 4.1 3.9  CL 109 108 109 111  CO2 22 23 25 23   GLUCOSE 102* 149* 104* 101*  BUN 38* 35* 30* 31*  CREATININE 1.12 1.04 0.99 0.87  CALCIUM 9.0 8.6* 8.5* 8.7*  MG 2.0 2.0  --  2.0   GFR: Estimated Creatinine Clearance: 60.1 mL/min (by C-G formula based on SCr of 0.87 mg/dL). Liver Function Tests: No results for input(s): AST, ALT, ALKPHOS, BILITOT, PROT, ALBUMIN in the last 168 hours. No results for input(s): LIPASE, AMYLASE in the last 168 hours. No results for input(s): AMMONIA in the last 168 hours. Coagulation Profile: Recent Labs  Lab  12/14/18 1656  INR 1.9*   Cardiac Enzymes: No results for input(s): CKTOTAL, CKMB, CKMBINDEX, TROPONINI in the last 168 hours. BNP (last 3 results) No results for input(s): PROBNP in the last 8760 hours. HbA1C: No results for input(s): HGBA1C in the last 72 hours. CBG: Recent Labs  Lab 12/19/18 0646  GLUCAP 97   Lipid Profile: No results for input(s): CHOL, HDL, LDLCALC, TRIG, CHOLHDL, LDLDIRECT in the last 72 hours. Thyroid Function Tests: No results for input(s): TSH, T4TOTAL, FREET4, T3FREE, THYROIDAB in the last 72 hours. Anemia Panel: No results for input(s): VITAMINB12, FOLATE, FERRITIN, TIBC, IRON, RETICCTPCT in the last 72 hours. Urine analysis:    Component Value Date/Time   COLORURINE YELLOW 04/19/2017 [place] 04/19/2017 1737   LABSPEC 1.025 04/19/2017 1737   PHURINE 5.0 04/19/2017 1737   GLUCOSEU NEGATIVE 04/19/2017 1737   HGBUR NEGATIVE 04/19/2017 1737   BILIRUBINUR negative 04/01/2018 1148   KETONESUR negative 03/28/2018 1116   KETONESUR 20 (A) 04/19/2017 1737   PROTEINUR Positive (A) 04/01/2018 1148   PROTEINUR NEGATIVE 04/19/2017 1737   UROBILINOGEN 0.2 04/01/2018 1148   UROBILINOGEN 1.0 04/27/2012 0019   NITRITE negative 04/01/2018 1148   NITRITE NEGATIVE 04/19/2017 1737   LEUKOCYTESUR Trace (A) 04/01/2018 1148   Sepsis Labs: @LABRCNTIP (procalcitonin:4,lacticidven:4) ) Recent Results (from the past 240 hour(s))  SARS CORONAVIRUS 2 (TAT 6-24 HRS) Nasopharyngeal Nasopharyngeal Swab     Status: None   Collection Time: 12/14/18  9:39 PM   Specimen: Nasopharyngeal Swab  Result Value Ref Range Status   SARS Coronavirus 2 NEGATIVE NEGATIVE Final    Comment: (NOTE) SARS-CoV-2 target nucleic acids are NOT DETECTED. The SARS-CoV-2 RNA is generally detectable in upper and lower respiratory specimens during the acute phase of infection. Negative results do not preclude SARS-CoV-2 infection, do not rule out co-infections with other  pathogens, and should not be used as the sole basis for treatment or other patient management decisions. Negative results must be combined with clinical observations, patient history, and epidemiological information. The expected result is Negative. Fact Sheet for Patients: SugarRoll.be Fact Sheet for Healthcare Providers: https://www.woods-mathews.com/ This test is not yet approved or cleared by the [place] FDA and  has been authorized for detection and/or diagnosis of SARS-CoV-2 by FDA under an Emergency Use Authorization (EUA). This EUA will remain  in effect (meaning this test can be used) for the duration of the COVID-19 declaration under Section 56 4(b)(1) of the Act, 21 U.S.C. section 360bbb-3(b)(1), unless the authorization is terminated or revoked sooner. Performed at [facility], [place] [address], [place], [place] [address]   Surgical PCR screen     Status: None   Collection Time: 12/15/18  2:04 AM   Specimen: Nasal Mucosa; Nasal Swab  Result Value Ref Range Status   MRSA, PCR NEGATIVE NEGATIVE Final  Staphylococcus aureus NEGATIVE NEGATIVE Final    Comment: (NOTE) The Xpert SA Assay (FDA approved for NASAL specimens in patients 46 years of age and older), is one component of a comprehensive surveillance program. It is not intended to diagnose infection nor to guide or monitor treatment. Performed at [facility], [place] [address] [place], [place] [address]          Radiology Studies: No results found.    Scheduled Meds:  acetaminophen  500 mg Oral Q8H   carbidopa-levodopa  1 tablet Oral TID   docusate sodium  100 mg Oral BID   feeding supplement (ENSURE ENLIVE)  237 mL Oral BID BM   lisinopril  10 mg Oral Daily   rivaroxaban  20 mg Oral Q supper   Continuous Infusions:   LOS: 7 days      Debbe Odea, MD [facility] Pager: [email or web address] Password [facility] 12/21/2018, 4:39 PM

## 2018-12-21 NOTE — TOC Progression Note (Signed)
Transition of Care [facility]) - Progression Note    Patient Details  Name: RONNALD BIDO MRN: [id number] Date of Birth: 30-Oct-1926  [facility]) CM/SW Contact  Midge Minium RN, BSN, NCM-BC, ACM-RN [phone] (working remotely) Phone Number: 12/21/2018, 12:11 PM  Clinical Narrative:    Patient was denied CIR from [facility]; PT/OT continue to recommend SNF vs Home with HH/DME and 24hr assist. Patient lives at home with his spouse and currently requires moderate to max assist. CM discussed the recommendations via phone with the patient/spouse, SNF process explained, with both now agreeable to considering ST SNF; does not want [facility] to be an option. CMS SNF compare list discussed with: [place] selected. CM discussed that the referral would be sent to [facility]; pending bed availability, will discuss further options if no bed are available, with both verbalizing understanding. SNF referral discussed with Claib[facility] Billings [facility] liaison); Claiborne Billings will view the referral and check bed availability. FL2/PASRR completed; insurance auth initiated. CM team will continue to follow.    Expected Discharge Plan: Skilled Nursing Facility Barriers to Discharge: Ship broker, SNF Pending bed offer  Expected Discharge Plan and Services Expected Discharge Plan: [place] In-house Referral: NA Discharge Planning Services: CM Consult Post Acute Care Choice: [place] Living arrangements for the past 2 months: Single Family Home                 DME Arranged: N/A DME Agency: NA       HH Arranged: NA HH Agency: NA         Social Determinants of Health (SDOH) Interventions    Readmission Risk Interventions No flowsheet data found.

## 2018-12-21 NOTE — Progress Notes (Signed)
Dressing removed per Ainsley Spinner, PA-C

## 2018-12-21 NOTE — NC FL2 (Signed)
[place] LEVEL OF CARE SCREENING TOOL     IDENTIFICATION  Patient Name: Chad Russell Birthdate: September 17, 1926 Sex: male Admission Date (Current Location): 12/14/2018  [facility] and [place] Number:  Herbalist and Address:  [facility], [place] [address] [place], [place] [address]      Provider Number: [id number]  Attending Physician Name and Address:  Debbe Odea, MD  Relative Name and Phone Number:  Loden Alber [phone]    Current Level of Care: Hospital Recommended Level of Care: [place] Prior Approval Number:    Date Approved/Denied:   PASRR Number: [id number]  Discharge Plan: SNF    Current Diagnoses: Patient Active Problem List   Diagnosis Date Noted  . AKI (acute kidney injury) ([place]) 12/15/2018  . 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 Hematoma of scalp   . Heart block   . Chest pain 05/04/2018  . Fracture of femoral neck, left ([place]) 04/19/2017  . MGUS (monoclonal gammopathy of unknown significance) 01/15/2017  . BPH with obstruction/lower urinary tract symptoms 09/07/2016  . Preoperative cardiovascular examination 08/24/2016  . Urinary retention 05/13/2016  . Balance problem 10/31/2013  . OSA (obstructive sleep apnea) 10/31/2013  . BPH (benign prostatic hyperplasia) 08/28/2012  . Atrial fibrillation ([place]) 06/08/2012  . Hyperplasia of prostate with lower urinary tract symptoms (LUTS) 04/28/2012  . Moderate malnutrition ([place]) 04/28/2012  . Infection of urinary tract - recurrent 04/26/2012  . NSTEMI (non-ST elevated myocardial infarction) ([place]) 03/15/2011  . Complete heart block ([place]) 03/15/2011  . HTN (hypertension) 03/15/2011    Orientation RESPIRATION BLADDER Height & Weight     Self, Time, Situation, Place  Normal Incontinent Weight: 78.6 kg Height:  5\' 11"  (180.3 cm)  BEHAVIORAL SYMPTOMS/MOOD NEUROLOGICAL BOWEL NUTRITION STATUS   Other (Comment)(N/A) (N/A) Continent Diet  AMBULATORY STATUS COMMUNICATION OF NEEDS Skin   Extensive Assist Verbally Surgical wounds(Left Hip incision)                       Personal Care Assistance Level of Assistance  Bathing, Feeding, Dressing Bathing Assistance: Maximum assistance Feeding assistance: Limited assistance Dressing Assistance: Maximum assistance     Functional Limitations Info  Sight, Hearing, Speech Sight Info: Adequate Hearing Info: Impaired(HOH bilateral ears) Speech Info: Adequate    SPECIAL CARE FACTORS FREQUENCY  PT (By licensed PT), OT (By licensed OT)     PT Frequency: Min 5X/week OT Frequency: Min 2X/week            Contractures Contractures Info: Not present    Additional Factors Info  Code Status, Allergies Code Status Info: Full Code Allergies Info: Doxycycline; Keflex           Current Medications (12/21/2018):  This is the current hospital active medication list Current Facility-Administered Medications  Medication Dose Route Frequency Provider Last Rate Last Dose  . acetaminophen (TYLENOL) tablet 500 mg  500 mg Oral Q8H Ainsley Spinner, PA-C   500 mg at 12/21/18 0606  . bisacodyl (DULCOLAX) EC tablet 5 mg  5 mg Oral Daily PRN Ainsley Spinner, PA-C      . carbidopa-levodopa (SINEMET IR) 25-100 MG per tablet immediate release 1 tablet  1 tablet Oral TID Ainsley Spinner, PA-C   1 tablet at 12/21/18 0606  . docusate sodium (COLACE) capsule 100 mg  100 mg Oral BID Ainsley Spinner, PA-C   100 mg at 12/21/18 [id number]  . feeding supplement (ENSURE ENLIVE) (ENSURE  ENLIVE) liquid 237 mL  237 mL Oral BID BM Nicole [facility] A, DO   237 mL at 12/21/18 1001  . HYDROcodone-acetaminophen (NORCO/VICODIN) 5-325 MG per tablet 1-2 tablet  1-2 tablet Oral Q6H PRN Ainsley Spinner, PA-C   1 tablet at 12/18/18 0919  . lisinopril (ZESTRIL) tablet 10 mg  10 mg Oral Daily Debbe Odea, MD   10 mg at 12/21/18 0956  . menthol-cetylpyridinium (CEPACOL) lozenge 3 mg  1 lozenge Oral  PRN Ainsley Spinner, PA-C       Or  . phenol (CHLORASEPTIC) mouth spray 1 spray  1 spray Mouth/Throat PRN Ainsley Spinner, PA-C      . metoCLOPramide (REGLAN) tablet 5-10 mg  5-10 mg Oral Q8H PRN Ainsley Spinner, PA-C       Or  . metoCLOPramide (REGLAN) injection 5-10 mg  5-10 mg Intravenous Q8H PRN Ainsley Spinner, PA-C      . morphine 2 MG/ML injection 0.5 mg  0.5 mg Intravenous Q2H PRN Ainsley Spinner, PA-C      . ondansetron [facility]) tablet 4 mg  4 mg Oral Q6H PRN Ainsley Spinner, PA-C       Or  . ondansetron [facility]) injection 4 mg  4 mg Intravenous Q6H PRN Ainsley Spinner, PA-C   4 mg at 12/16/18 2114  . rivaroxaban (XARELTO) tablet 20 mg  20 mg Oral Q supper Nicole [facility] A, DO   20 mg at 12/20/18 1638  . senna-docusate (Senokot-S) tablet 1 tablet  1 tablet Oral QHS PRN Ainsley Spinner, PA-C         Discharge Medications: Please see discharge summary for a list of discharge medications.  Relevant Imaging Results:  Relevant Lab Results:   Additional Information SSN: [id number]  Midge Minium RN, BSN, NCM-BC, ACM-RN [phone]

## 2018-12-21 NOTE — Care Management (Signed)
Attempted to discuss bed offers with the patient/spouse with no answer. CM will continue to follow.  Midge Minium RN, BSN, NCM-BC, ACM-RN [phone]

## 2018-12-21 NOTE — Progress Notes (Addendum)
   12/21/18 0949  SNF Authorization Status  SNF Authorization Type Transition of Care CM/SW Authorization Request  SNF Auth Started 12/21/18  SNF Auth Start Time [phone]  SNF Authorization Status Started   Insurance auth initiated for ST SNF; requested documentation faxed to [place] for [employer] authorization.   ADDENDUM: 12/22/18 @ 1311-Taylor Levick RNCM-[employer] rep states the patients insurance plan is no longer managed by their company to provide insurance auths. The facility will obtain [facility] auth directly from [place].    Midge Minium RN, BSN, NCM-BC, ACM-RN [phone]

## 2018-12-21 NOTE — Progress Notes (Signed)
Orthopaedic Trauma Service Progress Note  Patient ID: Chad Russell MRN: [id number] DOB/AGE: 04-24-1926 85 y.o.  Subjective:  No specific complaints  Insurance denied CIR  Agreeable to SNF   ROS As above  Objective:   VITALS:   Vitals:   12/20/18 1531 12/20/18 1958 12/21/18 0806 12/21/18 0956  BP: 140/67 (!) 160/74 (!) 163/71 (!) 163/71  Pulse: (!) 35 (!) 33 88   Resp: 18 16 17    Temp: 98.1 F (36.7 C) 97.7 F (36.5 C) 98.2 F (36.8 C)   TempSrc: Oral Oral Oral   SpO2: 95% 100% 100%   Weight:      Height:        Estimated body mass index is 24.18 kg/m as calculated from the following:   Height as of this encounter: 5\' 11"  (1.803 m).   Weight as of this encounter: 78.6 kg.   Intake/Output      10/28 0701 - 10/29 0700 10/29 0701 - 10/30 0700   P.O. 240 120   I.V. (mL/kg) 4464.4 (56.8)    Total Intake(mL/kg) 4704.4 (59.9) 120 (1.5)   Net +4704.4 +120        Urine Occurrence 1 x 1 x     LABS  No results found for this or any previous visit (from the past 24 hour(s)).   PHYSICAL EXAM:   Gen: resting comfortably in bed, wife at bedside, pleasant, appears well  Lungs: unlabored Cardiac: reg Ext:       Left lower extremity              incision looks great   No erythema    No signs of infection              Ext warm              Swelling controlled             No DCT             Compartments soft             Distal motor and sensory functions intact  Assessment/Plan: 5 Days Post-Op   Principal Problem:   Fracture of femoral neck, left (HCC) Active Problems:   Complete heart block (HCC)   HTN (hypertension)   Atrial fibrillation (HCC)   BPH (benign prostatic hyperplasia)   AKI (acute kidney injury) ([place])   Dementia due to Parkinson's disease without behavioral disturbance ([place])   Anti-infectives (From admission, onward)   Start     Dose/Rate Route Frequency  Ordered Stop   12/16/18 1400  ceFAZolin (ANCEF) IVPB 2g/100 mL premix    Note to Pharmacy: Tolerated ancef in OR   2 g 200 mL/hr over 30 Minutes Intravenous Every 6 hours 12/16/18 1201 12/16/18 2027   12/16/18 0802  ceFAZolin (ANCEF) 2-4 GM/100ML-% IVPB    Note to Pharmacy: Rejeana Brock   : cabinet override      12/16/18 0802 12/16/18 2014    .  POD/HD#: 41  85 y/o male s/p fall with L femoral neck fracture   -displaced L femoral neck fracture s/p left hip hemiarthroplasty             Weight-bear as tolerated             Posterior hip precautions  PT and OT             Ice             Dressing changes as needed                         Ok to shower and clean wounds with soap and water only                          Ok to leave wound open to air                          Sutures can be removed POD #14   - Pain management:             Continue with current regimen                          Scheduled tylenol                          norco as needed    - ABL anemia/Hemodynamics             stable                - Medical issues              Per primary    - DVT/PE prophylaxis:             On Xarelto chronically for A. Fib             Restarted on postop day 1    - ID:              periop abx completed    - Metabolic Bone Disease:             Vitamin D levels look outstanding                         Continue with home regimen               2nd hip fracture in a year and half             Suggestive of very poor bone quality      - FEN/GI prophylaxis/Foley/Lines:             Diet as tolerated    - Dispo:             stable for SNF   Follow up with ortho in 10 days   Jari Pigg, PA-C [phone] (C) 12/21/2018, 12:16 PM  [facility] [place] [address] [phone] Jenetta [phone] (F)   After 6pm on weekdays please call office number to get in touch with on call provider or refer to [place] and look to see  who is on call for the Sports Medicine Call Group which is listed under orthopaedics   On Weekends please call office number to get in touch with on call provider or refer to [place] and look to see who is on call for the Sports Medicine Call Group which is listed under orthopaedics

## 2018-12-22 NOTE — Progress Notes (Signed)
PT Cancellation Note  Patient Details Name: KEANON VETRANO MRN: [id number] DOB: [birth year]   Cancelled Treatment:    Reason Eval/Treat Not Completed: Medical issues which prohibited therapy per chart review, patient quite bradycardic with most recent HRs available in flowsheets 35, 35, 33, and 35. Do not feel patient appropriate to safely participate in mobility with these numbers. Will follow acutely and return when patient is medically appropriate and time/schedule allow.    Windell Norfolk, DPT, CBIS  Supplemental Physical Therapist [facility]    Pager [phone] Acute Rehab Office [phone]

## 2018-12-22 NOTE — Progress Notes (Signed)
Chronic SB noted since pt's adm

## 2018-12-22 NOTE — TOC Transition Note (Signed)
Transition of Care [facility]) - CM/SW Discharge Note   Patient Details  Name: Chad Russell MRN: [id number] Date of Birth: [birth year]  Transition of Care [facility]) CM/SW Contact:  Midge Minium RN, BSN, NCM-BC, ACM-RN [phone] (working remotely) Phone Number: 12/22/2018, 1:06 PM   Clinical Narrative:    CMS SNF compare list provided to patient/spouse for SNF preference. [facility] does not have bed availability till 11/2; other preferences: [facility] (not in network with insurance), [facility] does not have bed availability; [facility] has offered a bed with Levada Dy ([facility] liaison) to discuss the facility with the patient/spouse. CM followed up with the patient/spouse with both agreeable to pursuing [facility] SNF. Angela Arboriculturist) is initiating auth for SNF Placement; [facility] does not manage the patients insurance plan. CM team will continue to follow.      Barriers to Discharge: Insurance Authorization   Patient Goals and CMS Choice Patient states their goals for this hospitalization and ongoing recovery are:: "go to rehab to regain strength, and get home" CMS Medicare.gov Compare Post Acute Care list provided to:: Patient Choice offered to / list presented to : Patient, Spouse  Discharge Placement              Patient chooses bed at: [facility]        Discharge Plan and Services In-house Referral: NA Discharge Planning Services: CM Consult Post Acute Care Choice: [place]          DME Arranged: N/A DME Agency: NA       HH Arranged: NA HH Agency: NA        Social Determinants of Health (SDOH) Interventions     Readmission Risk Interventions No flowsheet data found.

## 2018-12-22 NOTE — Progress Notes (Signed)
Physical Therapy Treatment Patient Details Name: Chad Russell MRN: [id number] DOB: 1926-09-09 Today's Date: 12/22/2018    History of Present Illness 85 y.o. male with medical history significant of Parkinson's disease, complete heart block (x 20 years with no indication for pacemaker), CAD, atrial fibrillation on Xarelto, hypertension, OSA not on CPAP.  He presented on 10/22 with left hip pain status post a mechanical fall the night before.  He reported a slip in the foyer of his home when not using his walker, resulting in left femoral neck fracture. Now, s/p L hip hemiarthroplasty, posterior hip precautions, WBAT    PT Comments    PA-C reached out to PT and reported that bradycardia in 30s is patient's normal and he is safe to work with therapy- indicated this in PT sticky notes to prevent future un-necessary cancellations. Patient very pleasant and very motivated to participate in session today, and notably able to perform functional mobility today with much less assist than previously. Performed functional bed mobility with ModAx2 to maintain posterior hip precautions but did require MinA to maintain balance at EOB even when sitting statically without UE support. Able to perform sit to stand with MinAx2 in stedy multiple times, pivot to chair in stedy. He was left up in chair with all needs met and vitals at baseline, chair alarm active and family present. Continues to progress well but do continue to recommend CIR moving forward.    Follow Up Recommendations  CIR;Supervision/Assistance - 24 hour     Equipment Recommendations  None recommended by PT    Recommendations for Other Services       Precautions / Restrictions Precautions Precautions: Fall;Posterior Hip Precaution Booklet Issued: Yes (comment) Precaution Comments: Able to state 2/3 precautions; reinstructed Restrictions Weight Bearing Restrictions: No LLE Weight Bearing: Weight bearing as tolerated    Mobility  Bed  Mobility Overal bed mobility: Needs Assistance Bed Mobility: Supine to Sit     Supine to sit: Mod assist;+2 for physical assistance     General bed mobility comments: ModAx2 for safe bed mobility and to maintain hip precautions, once at EOB able to get good pushdown with triceps to scoot hips forward at EOB with min guard. MinA to maintain upright sitting when not holding onto stedy due to posterior lean  Transfers Overall transfer level: Needs assistance   Transfers: Sit to/from Stand;Stand Pivot Transfers Sit to Stand: Min assist;+2 physical assistance Stand pivot transfers: Total assist;+2 physical assistance       General transfer comment: MinAx2 for safe sit to stand in stedy, then totalA pivot to chair in stedy. Able to perform multiple sit to stands in stedy with MinAx2  Ambulation/Gait                 Stairs             Wheelchair Mobility    Modified Rankin (Stroke Patients Only)       Balance Overall balance assessment: Needs assistance;History of Falls   Sitting balance-Leahy Scale: Poor Sitting balance - Comments: MinA to maintain upright due to posterior lean Postural control: Posterior lean   Standing balance-Leahy Scale: Poor                              Cognition Arousal/Alertness: Awake/alert Behavior During Therapy: WFL for tasks assessed/performed Overall Cognitive Status: Within Functional Limits for tasks assessed  Exercises      General Comments General comments (skin integrity, edema, etc.): SpO2 above 90% on room air      Pertinent Vitals/Pain Pain Assessment: No/denies pain Pain Score: 0-No pain Pain Intervention(s): Monitored during session;Limited activity within patient's tolerance    Home Living                      Prior Function            PT Goals (current goals can now be found in the care plan section) Acute Rehab PT  Goals Patient Stated Goal: go to rehab to regain strength, and get home PT Goal Formulation: With patient Time For Goal Achievement: 12/31/18 Potential to Achieve Goals: Fair Progress towards PT goals: Progressing toward goals    Frequency    Min 5X/week      PT Plan Current plan remains appropriate    Co-evaluation              AM-PAC PT "6 Clicks" Mobility   Outcome Measure  Help needed turning from your back to your side while in a flat bed without using bedrails?: A Lot Help needed moving from lying on your back to sitting on the side of a flat bed without using bedrails?: A Lot Help needed moving to and from a bed to a chair (including a wheelchair)?: A Little Help needed standing up from a chair using your arms (e.g., wheelchair or bedside chair)?: A Little Help needed to walk in hospital room?: A Lot Help needed climbing 3-5 steps with a railing? : Total 6 Click Score: 13    End of Session Equipment Utilized During Treatment: Gait belt Activity Tolerance: Patient tolerated treatment well Patient left: in chair;with call bell/phone within reach;with chair alarm set   PT Visit Diagnosis: Unsteadiness on feet (R26.81);Other abnormalities of gait and mobility (R26.89);Repeated falls (R29.6);Muscle weakness (generalized) (M62.81);History of falling (Z91.81)     Time: 9198-0221 PT Time Calculation (min) (ACUTE ONLY): 20 min  Charges:  $Therapeutic Activity: 8-22 mins                     Windell Norfolk, DPT, CBIS  Supplemental Physical Therapist [place]    Pager [phone] Acute Rehab Office [phone]

## 2018-12-22 NOTE — Plan of Care (Addendum)
  Problem: Activity: Goal: Risk for activity intolerance will decrease Outcome: Progressing   Problem: Pain Managment: Goal: General experience of comfort will improve Outcome: Progressing   Problem: Elimination: Goal: Will not experience complications related to bowel motility Outcome: Progressing   Problem: Safety: Goal: Ability to remain free from injury will improve Outcome: Progressing   Pt continued to score yellow on the MEWS due to Sinus Bradycardia 33-40 during the night. This has been a chronic issue since pt was admitted. Bed exit alarm maintained. Call light and phone within reach.

## 2018-12-22 NOTE — Progress Notes (Signed)
Orthopaedic Trauma Service Progress Note  Patient ID: Chad Russell MRN: [id number] DOB/AGE: 09-18-1926 85 y.o.  Subjective:  No acute issues   Talked with [place] dermatology yesterday and they are ok with me removing sutures from L face as pt missed appointment due to hospitalization---> sutures removed without incident   Reviewed PT note and discussed with them. They will try to work with him later today, if not tomorrow   Pain adequately controlled on tylenol   ROS  As above   Objective:   VITALS:   Vitals:   12/21/18 2045 12/22/18 0511 12/22/18 0757 12/22/18 0936  BP: (!) 158/73 (!) 155/73 (!) 144/69 (!) 144/69  Pulse: (!) 35 (!) 33 (!) 35   Resp: 19 16 18    Temp: 98.4 F (36.9 C) (!) 97.4 F (36.3 C) 98 F (36.7 C)   TempSrc: Oral Oral Oral   SpO2: 98% 93% 94%   Weight:      Height:        Estimated body mass index is 24.18 kg/m as calculated from the following:   Height as of this encounter: 5\' 11"  (1.803 m).   Weight as of this encounter: 78.6 kg.   Intake/Output      10/29 0701 - 10/30 0700 10/30 0701 - 10/31 0700   P.O. 480 240   I.V. (mL/kg)     Total Intake(mL/kg) 480 (6.1) 240 (3.1)   Urine (mL/kg/hr) 650 (0.3)    Total Output 650    Net -170 +240        Urine Occurrence 1 x      LABS  Results for orders placed or performed during the hospital encounter of 12/14/18 (from the past 24 hour(s))  SARS CORONAVIRUS 2 (TAT 6-24 HRS) Nasopharyngeal Nasopharyngeal Swab     Status: None   Collection Time: 12/21/18  5:26 PM   Specimen: Nasopharyngeal Swab  Result Value Ref Range   SARS Coronavirus 2 NEGATIVE NEGATIVE     PHYSICAL EXAM:   Gen: resting comfortably in bed, wife at bedside, pleasant, appears well  Lungs: unlabored Cardiac: reg Ext:       Left lower extremity              incision stable                          No erythema                          No  signs of infection              Ext warm              Swelling controlled             No DCT             Compartments soft             Distal motor and sensory functions intact  Assessment/Plan: 6 Days Post-Op   Principal Problem:   Fracture of femoral neck, left (HCC) Active Problems:   Complete heart block (HCC)   HTN (hypertension)   Atrial fibrillation (HCC)   BPH (benign prostatic hyperplasia)   AKI (acute kidney injury) ([place])   Dementia due  to Parkinson's disease without behavioral disturbance ([place])   Anti-infectives (From admission, onward)   Start     Dose/Rate Route Frequency Ordered Stop   12/16/18 1400  ceFAZolin (ANCEF) IVPB 2g/100 mL premix    Note to Pharmacy: Tolerated ancef in OR   2 g 200 mL/hr over 30 Minutes Intravenous Every 6 hours 12/16/18 1201 12/16/18 2027   12/16/18 0802  ceFAZolin (ANCEF) 2-4 GM/100ML-% IVPB    Note to Pharmacy: Rejeana Brock   : cabinet override      12/16/18 0802 12/16/18 2014    .  POD/HD#: 108  85 y/o male s/p fall with L femoral neck fracture   -displaced L femoral neck fracture s/p left hip hemiarthroplasty             Weight-bear as tolerated             Posterior hip precautions             PT and OT             Ice             Dressing changes as needed                         Ok to shower and clean wounds with soap and water only                          Ok to leave wound open to air                          Sutures can be removed POD #14   - Pain management:             Continue with current regimen                          Scheduled tylenol                          think he can be discharged with just tylenol   - ABL anemia/Hemodynamics             stable                - Medical issues              Per primary    - DVT/PE prophylaxis:             On Xarelto chronically for A. Fib             Restarted on postop day 1    - ID:              periop abx completed    - Metabolic Bone Disease:              Vitamin D levels look outstanding                         Continue with home regimen               2nd hip fracture in a year and half             Suggestive of very poor bone quality      - FEN/GI prophylaxis/Foley/Lines:  Diet as tolerated    - Dispo:             stable for SNF              Follow up with ortho in 10 days    Jari Pigg, PA-C [phone] (C) 12/22/2018, 1:33 PM  [facility] [place] [phone] Domingo Sep (F)   After 6pm on weekdays please call office number to get in touch with on call provider or refer to [place] and look to see who is on call for the Sports Medicine Call Group which is listed under orthopaedics   On Weekends please call office number to get in touch with on call provider or refer to [place] and look to see who is on call for the Sports Medicine Call Group which is listed under orthopaedics

## 2018-12-22 NOTE — Progress Notes (Signed)
PROGRESS NOTE    Chad Russell   [id number]  DOB: [birth year]  DOA: 12/14/2018 PCP: [name], MD   Brief Narrative:  Chad Russell 85 y.o.malewith medical history significant of Parkinson's disease, complete heart block(x20 years with no indication for pacemaker), CAD, atrial fibrillation on Xarelto, hypertension, OSA not on CPAP. He presents with left hip pain status post mechanical fall the night before.  Found to have a left femoral neck fracture and underwent left hip hemiarthroplasty on 10/24.   Subjective: The patient has no complaints today. Specifically, no nausea, vomiting, constipation or respiratory symptoms.     Assessment & Plan:   Principal Problem:   Fracture of femoral neck, left  -Status post left hip arthroplasty on 10/24 -Xarelto has been resumed  - sutures to be removed on POD 14  Active Problems: Acute blood loss anemia -Hemoglobin is stable around 10 and is not dropping further therefore hold off on transfusion    Atrial fibrillation with junctional escape rhythm and bradycardia H/o complete heart block - last seen by Dr Percival Spanish on 10/2-  At that time the patient was decided on having elective hip surgery- Dr Warren Lacy did not feel that his cardiac issues should prohibit surgery. - no indication for pacemaker -Continue Xarelto  Laceration left ear - sutured in ED - skin has healed - appreciate ortho PA for removing sutures today  Hypertension -Resume lisinopril -Continue to hold HCTZ    AKI (acute kidney injury) - possibly dehydration- holding HCTZ -presented was 1.42 on admission and has improved to 0.87   Time spent in minutes: 35 DVT prophylaxis: Xarelto Code Status: Full Code Family Communication:  Disposition Plan:   he is a 2 person assist and will need SNF Consultants:   Ortho Procedures:   Left hip arthroplasty Antimicrobials:  Anti-infectives (From admission, onward)   Start     Dose/Rate Route  Frequency Ordered Stop   12/16/18 1400  ceFAZolin (ANCEF) IVPB 2g/100 mL premix    Note to Pharmacy: Tolerated ancef in OR   2 g 200 mL/hr over 30 Minutes Intravenous Every 6 hours 12/16/18 1201 12/16/18 2027   12/16/18 0802  ceFAZolin (ANCEF) 2-4 GM/100ML-% IVPB    Note to Pharmacy: Rejeana Brock   : cabinet override      12/16/18 0802 12/16/18 2014       Objective: Vitals:   12/22/18 0511 12/22/18 0757 12/22/18 0936 12/22/18 1451  BP: (!) 155/73 (!) 144/69 (!) 144/69 120/64  Pulse: (!) 33 (!) 35    Resp: 16 18  15   Temp: (!) 97.4 F (36.3 C) 98 F (36.7 C)  (!) 97.5 F (36.4 C)  TempSrc: Oral Oral  Oral  SpO2: 93% 94%    Weight:      Height:        Intake/Output Summary (Last 24 hours) at 12/22/2018 1519 Last data filed at 12/22/2018 1500 Gross per 24 hour  Intake 720 ml  Output 450 ml  Net 270 ml   Filed Weights   12/15/18 0324  Weight: 78.6 kg    Examination: General exam: Appears comfortable  HEENT: PERRLA, oral mucosa moist, no sclera icterus or thrush- laceration on left pre- auricular area is healing well Respiratory system: Clear to auscultation. Respiratory effort normal. Cardiovascular system: S1 & S2 heard,  No murmurs  Gastrointestinal system: Abdomen soft, non-tender, nondistended. Normal bowel sounds   Central nervous system: Alert and oriented. No focal neurological deficits. Extremities: No cyanosis, clubbing or edema Skin:  No rashes or ulcers Psychiatry:  Mood & affect appropriate.    Data Reviewed: I have personally reviewed following labs and imaging studies  CBC: Recent Labs  Lab 12/16/18 0421 12/17/18 0504 12/18/18 0405 12/19/18 0323 12/20/18 0206  WBC 10.3 11.0* 10.5 9.1 9.0  HGB 12.5* 11.8* 10.6* 10.7* 10.1*  HCT 37.0* 35.4* 32.7* 32.7* 31.2*  MCV 96.9 98.3 100.0 100.9* 100.3*  PLT 172 181 164 195 [id number]   Basic Metabolic Panel: Recent Labs  Lab 12/16/18 0421 12/17/18 0504 12/18/18 0405 12/19/18 0323  NA 140 140 141 142   K 3.9 4.1 4.1 3.9  CL 109 108 109 111  CO2 22 23 25 23   GLUCOSE 102* 149* 104* 101*  BUN 38* 35* 30* 31*  CREATININE 1.12 1.04 0.99 0.87  CALCIUM 9.0 8.6* 8.5* 8.7*  MG 2.0 2.0  --  2.0   GFR: Estimated Creatinine Clearance: 60.1 mL/min (by C-G formula based on SCr of 0.87 mg/dL). Liver Function Tests: No results for input(s): AST, ALT, ALKPHOS, BILITOT, PROT, ALBUMIN in the last 168 hours. No results for input(s): LIPASE, AMYLASE in the last 168 hours. No results for input(s): AMMONIA in the last 168 hours. Coagulation Profile: No results for input(s): INR, PROTIME in the last 168 hours. Cardiac Enzymes: No results for input(s): CKTOTAL, CKMB, CKMBINDEX, TROPONINI in the last 168 hours. BNP (last 3 results) No results for input(s): PROBNP in the last 8760 hours. HbA1C: No results for input(s): HGBA1C in the last 72 hours. CBG: Recent Labs  Lab 12/19/18 0646  GLUCAP 97   Lipid Profile: No results for input(s): CHOL, HDL, LDLCALC, TRIG, CHOLHDL, LDLDIRECT in the last 72 hours. Thyroid Function Tests: No results for input(s): TSH, T4TOTAL, FREET4, T3FREE, THYROIDAB in the last 72 hours. Anemia Panel: No results for input(s): VITAMINB12, FOLATE, FERRITIN, TIBC, IRON, RETICCTPCT in the last 72 hours. Urine analysis:    Component Value Date/Time   COLORURINE YELLOW 04/19/2017 [place] 04/19/2017 1737   LABSPEC 1.025 04/19/2017 1737   PHURINE 5.0 04/19/2017 1737   GLUCOSEU NEGATIVE 04/19/2017 1737   HGBUR NEGATIVE 04/19/2017 1737   BILIRUBINUR negative 04/01/2018 1148   KETONESUR negative 03/28/2018 1116   KETONESUR 20 (A) 04/19/2017 1737   PROTEINUR Positive (A) 04/01/2018 1148   PROTEINUR NEGATIVE 04/19/2017 1737   UROBILINOGEN 0.2 04/01/2018 1148   UROBILINOGEN 1.0 04/27/2012 0019   NITRITE negative 04/01/2018 1148   NITRITE NEGATIVE 04/19/2017 1737   LEUKOCYTESUR Trace (A) 04/01/2018 1148   Sepsis Labs: @LABRCNTIP (procalcitonin:4,lacticidven:4)  ) Recent Results (from the past 240 hour(s))  SARS CORONAVIRUS 2 (TAT 6-24 HRS) Nasopharyngeal Nasopharyngeal Swab     Status: None   Collection Time: 12/14/18  9:39 PM   Specimen: Nasopharyngeal Swab  Result Value Ref Range Status   SARS Coronavirus 2 NEGATIVE NEGATIVE Final    Comment: (NOTE) SARS-CoV-2 target nucleic acids are NOT DETECTED. The SARS-CoV-2 RNA is generally detectable in upper and lower respiratory specimens during the acute phase of infection. Negative results do not preclude SARS-CoV-2 infection, do not rule out co-infections with other pathogens, and should not be used as the sole basis for treatment or other patient management decisions. Negative results must be combined with clinical observations, patient history, and epidemiological information. The expected result is Negative. Fact Sheet for Patients: SugarRoll.be Fact Sheet for Healthcare Providers: https://www.woods-mathews.com/ This test is not yet approved or cleared by the [place] FDA and  has been authorized for detection and/or diagnosis of SARS-CoV-2 by  FDA under an Emergency Use Authorization (EUA). This EUA will remain  in effect (meaning this test can be used) for the duration of the COVID-19 declaration under Section 56 4(b)(1) of the Act, 21 U.S.C. section 360bbb-3(b)(1), unless the authorization is terminated or revoked sooner. Performed at [facility], [place] [address] [place], [place] [address]   Surgical PCR screen     Status: None   Collection Time: 12/15/18  2:04 AM   Specimen: Nasal Mucosa; Nasal Swab  Result Value Ref Range Status   MRSA, PCR NEGATIVE NEGATIVE Final   Staphylococcus aureus NEGATIVE NEGATIVE Final    Comment: (NOTE) The Xpert SA Assay (FDA approved for NASAL specimens in patients 68 years of age and older), is one component of a comprehensive surveillance program. It is not intended to diagnose infection nor to  guide or monitor treatment. Performed at [facility], [place] [address], [place], [place] [address]   SARS CORONAVIRUS 2 (TAT 6-24 HRS) Nasopharyngeal Nasopharyngeal Swab     Status: None   Collection Time: 12/21/18  5:26 PM   Specimen: Nasopharyngeal Swab  Result Value Ref Range Status   SARS Coronavirus 2 NEGATIVE NEGATIVE Final    Comment: (NOTE) SARS-CoV-2 target nucleic acids are NOT DETECTED. The SARS-CoV-2 RNA is generally detectable in upper and lower respiratory specimens during the acute phase of infection. Negative results do not preclude SARS-CoV-2 infection, do not rule out co-infections with other pathogens, and should not be used as the sole basis for treatment or other patient management decisions. Negative results must be combined with clinical observations, patient history, and epidemiological information. The expected result is Negative. Fact Sheet for Patients: SugarRoll.be Fact Sheet for Healthcare Providers: https://www.woods-mathews.com/ This test is not yet approved or cleared by the [place] FDA and  has been authorized for detection and/or diagnosis of SARS-CoV-2 by FDA under an Emergency Use Authorization (EUA). This EUA will remain  in effect (meaning this test can be used) for the duration of the COVID-19 declaration under Section 56 4(b)(1) of the Act, 21 U.S.C. section 360bbb-3(b)(1), unless the authorization is terminated or revoked sooner. Performed at [facility], [place] [address] [place], [place] [address]          Radiology Studies: No results found.    Scheduled Meds: . acetaminophen  500 mg Oral Q8H  . carbidopa-levodopa  1 tablet Oral TID  . docusate sodium  100 mg Oral BID  . feeding supplement (ENSURE ENLIVE)  237 mL Oral BID BM  . lisinopril  10 mg Oral Daily  . rivaroxaban  20 mg Oral Q supper   Continuous Infusions:   LOS: 8 days      Debbe Odea, MD [facility] Pager: [email or web address] Password [email or web address] 12/22/2018, 3:19 PM

## 2018-12-23 MED ORDER — AMLODIPINE BESYLATE 5 MG PO TABS
5.0000 mg | ORAL_TABLET | Freq: Every day | ORAL | Status: DC
  Administered 2018-12-23 – 2018-12-24 (×2): 5 mg via ORAL
  Filled 2018-12-23 (×2): qty 1

## 2018-12-23 MED ORDER — LISINOPRIL 20 MG PO TABS
20.0000 mg | ORAL_TABLET | Freq: Every day | ORAL | Status: DC
  Administered 2018-12-23 – 2018-12-26 (×4): 20 mg via ORAL
  Filled 2018-12-23 (×4): qty 1

## 2018-12-23 NOTE — Progress Notes (Signed)
MD aware of pt's HR sinus brady. Pt states that is his baseline and has been his baseline since being in hospital based on flow sheets. Pt is asymptomatic and is A&O x 4.Will continue to monitor pt.

## 2018-12-23 NOTE — Progress Notes (Signed)
PROGRESS NOTE    Chad Russell   [id number]  DOB: [birth year]  DOA: 12/14/2018 PCP: [name], MD   Brief Narrative:  Chad Russell 85 y.o.malewith medical history significant of Parkinson's disease, complete heart block(x20 years with no indication for pacemaker), CAD, atrial fibrillation on Xarelto, hypertension, OSA not on CPAP. He presents with left hip pain status post mechanical fall the night before.  Found to have a left femoral neck fracture and underwent left hip hemiarthroplasty on 10/24.   Subjective: He has no new complaints today. He has pain mainly when he moves.   Assessment & Plan:   Principal Problem:   Fracture of femoral neck, left  -Status post left hip arthroplasty on 10/24 -Xarelto has been resumed  - sutures to be removed on POD 14  Active Problems: Acute blood loss anemia -Hemoglobin is stable around 10 and is not dropping further therefore hold off on transfusion    Atrial fibrillation with junctional escape rhythm and bradycardia H/o complete heart block - last seen by Dr Percival Spanish on 10/2-  At that time the patient was decided on having elective hip surgery- Dr Warren Lacy did not feel that his cardiac issues should prohibit surgery. - no indication for pacemaker -Continue Xarelto  Laceration left ear - sutured in ED - skin has healed - appreciate ortho PA for removing sutures 10/30  Hypertension -Resumed lisinopril- BP quite elevated - he has not been in pain- increase Lisinopril to to 20 mg from 10mg  and add 5 mg of Norvasc today -Continue to hold HCTZ    AKI (acute kidney injury) - possibly dehydration- holding HCTZ -presented was 1.42 on admission and has improved to 0.87   Time spent in minutes: 35 DVT prophylaxis: Xarelto Code Status: Full Code Family Communication:  Disposition Plan:   he is a 2 person assist and will need SNF Consultants:   Ortho Procedures:   Left hip arthroplasty Antimicrobials:   Anti-infectives (From admission, onward)   Start     Dose/Rate Route Frequency Ordered Stop   12/16/18 1400  ceFAZolin (ANCEF) IVPB 2g/100 mL premix    Note to Pharmacy: Tolerated ancef in OR   2 g 200 mL/hr over 30 Minutes Intravenous Every 6 hours 12/16/18 1201 12/16/18 2027   12/16/18 0802  ceFAZolin (ANCEF) 2-4 GM/100ML-% IVPB    Note to Pharmacy: Rejeana Brock   : cabinet override      12/16/18 0802 12/16/18 2014       Objective: Vitals:   12/22/18 1451 12/22/18 1924 12/22/18 2132 12/23/18 0514  BP: 120/64 (!) 159/76 (!) 177/72 (!) 162/77  Pulse:  (!) 35 (!) 35 (!) 34  Resp: 15 18 17 20   Temp: (!) 97.5 F (36.4 C) 98.2 F (36.8 C) (!) 97.5 F (36.4 C) (!) 97.5 F (36.4 C)  TempSrc: Oral  Oral Oral  SpO2:  100% 97% 99%  Weight:      Height:        Intake/Output Summary (Last 24 hours) at 12/23/2018 0720 Last data filed at 12/23/2018 0514 Gross per 24 hour  Intake 720 ml  Output 425 ml  Net 295 ml   Filed Weights   12/15/18 0324  Weight: 78.6 kg    Examination: General exam: Appears comfortable  HEENT: PERRLA, oral mucosa moist, no sclera icterus or thrush Respiratory system: Clear to auscultation. Respiratory effort normal. Cardiovascular system: S1 & S2 heard,  No murmurs  Gastrointestinal system: Abdomen soft, non-tender, nondistended. Normal bowel sounds  Central nervous system: Alert and oriented. No focal neurological deficits. Extremities: No cyanosis, clubbing or edema Skin: No rashes or ulcers Psychiatry:  Mood & affect appropriate.    Data Reviewed: I have personally reviewed following labs and imaging studies  CBC: Recent Labs  Lab 12/17/18 0504 12/18/18 0405 12/19/18 0323 12/20/18 0206  WBC 11.0* 10.5 9.1 9.0  HGB 11.8* 10.6* 10.7* 10.1*  HCT 35.4* 32.7* 32.7* 31.2*  MCV 98.3 100.0 100.9* 100.3*  PLT 181 164 195 [id number]   Basic Metabolic Panel: Recent Labs  Lab 12/17/18 0504 12/18/18 0405 12/19/18 0323  NA 140 141 142  K 4.1 4.1  3.9  CL 108 109 111  CO2 23 25 23   GLUCOSE 149* 104* 101*  BUN 35* 30* 31*  CREATININE 1.04 0.99 0.87  CALCIUM 8.6* 8.5* 8.7*  MG 2.0  --  2.0   GFR: Estimated Creatinine Clearance: 60.1 mL/min (by C-G formula based on SCr of 0.87 mg/dL). Liver Function Tests: No results for input(s): AST, ALT, ALKPHOS, BILITOT, PROT, ALBUMIN in the last 168 hours. No results for input(s): LIPASE, AMYLASE in the last 168 hours. No results for input(s): AMMONIA in the last 168 hours. Coagulation Profile: No results for input(s): INR, PROTIME in the last 168 hours. Cardiac Enzymes: No results for input(s): CKTOTAL, CKMB, CKMBINDEX, TROPONINI in the last 168 hours. BNP (last 3 results) No results for input(s): PROBNP in the last 8760 hours. HbA1C: No results for input(s): HGBA1C in the last 72 hours. CBG: Recent Labs  Lab 12/19/18 0646  GLUCAP 97   Lipid Profile: No results for input(s): CHOL, HDL, LDLCALC, TRIG, CHOLHDL, LDLDIRECT in the last 72 hours. Thyroid Function Tests: No results for input(s): TSH, T4TOTAL, FREET4, T3FREE, THYROIDAB in the last 72 hours. Anemia Panel: No results for input(s): VITAMINB12, FOLATE, FERRITIN, TIBC, IRON, RETICCTPCT in the last 72 hours. Urine analysis:    Component Value Date/Time   COLORURINE YELLOW 04/19/2017 [place] 04/19/2017 1737   LABSPEC 1.025 04/19/2017 1737   PHURINE 5.0 04/19/2017 1737   GLUCOSEU NEGATIVE 04/19/2017 1737   HGBUR NEGATIVE 04/19/2017 1737   BILIRUBINUR negative 04/01/2018 1148   KETONESUR negative 03/28/2018 1116   KETONESUR 20 (A) 04/19/2017 1737   PROTEINUR Positive (A) 04/01/2018 1148   PROTEINUR NEGATIVE 04/19/2017 1737   UROBILINOGEN 0.2 04/01/2018 1148   UROBILINOGEN 1.0 04/27/2012 0019   NITRITE negative 04/01/2018 1148   NITRITE NEGATIVE 04/19/2017 1737   LEUKOCYTESUR Trace (A) 04/01/2018 1148   Sepsis Labs: @LABRCNTIP (procalcitonin:4,lacticidven:4) ) Recent Results (from the past 240  hour(s))  SARS CORONAVIRUS 2 (TAT 6-24 HRS) Nasopharyngeal Nasopharyngeal Swab     Status: None   Collection Time: 12/14/18  9:39 PM   Specimen: Nasopharyngeal Swab  Result Value Ref Range Status   SARS Coronavirus 2 NEGATIVE NEGATIVE Final    Comment: (NOTE) SARS-CoV-2 target nucleic acids are NOT DETECTED. The SARS-CoV-2 RNA is generally detectable in upper and lower respiratory specimens during the acute phase of infection. Negative results do not preclude SARS-CoV-2 infection, do not rule out co-infections with other pathogens, and should not be used as the sole basis for treatment or other patient management decisions. Negative results must be combined with clinical observations, patient history, and epidemiological information. The expected result is Negative. Fact Sheet for Patients: SugarRoll.be Fact Sheet for Healthcare Providers: https://www.woods-mathews.com/ This test is not yet approved or cleared by the [place] FDA and  has been authorized for detection and/or diagnosis of SARS-CoV-2 by FDA  under an Emergency Use Authorization (EUA). This EUA will remain  in effect (meaning this test can be used) for the duration of the COVID-19 declaration under Section 56 4(b)(1) of the Act, 21 U.S.C. section 360bbb-3(b)(1), unless the authorization is terminated or revoked sooner. Performed at [facility], [place] [address] [place], [place] [address]   Surgical PCR screen     Status: None   Collection Time: 12/15/18  2:04 AM   Specimen: Nasal Mucosa; Nasal Swab  Result Value Ref Range Status   MRSA, PCR NEGATIVE NEGATIVE Final   Staphylococcus aureus NEGATIVE NEGATIVE Final    Comment: (NOTE) The Xpert SA Assay (FDA approved for NASAL specimens in patients 24 years of age and older), is one component of a comprehensive surveillance program. It is not intended to diagnose infection nor to guide or monitor treatment. Performed  at [facility], [place] [address] [place], [place] [address]   SARS CORONAVIRUS 2 (TAT 6-24 HRS) Nasopharyngeal Nasopharyngeal Swab     Status: None   Collection Time: 12/21/18  5:26 PM   Specimen: Nasopharyngeal Swab  Result Value Ref Range Status   SARS Coronavirus 2 NEGATIVE NEGATIVE Final    Comment: (NOTE) SARS-CoV-2 target nucleic acids are NOT DETECTED. The SARS-CoV-2 RNA is generally detectable in upper and lower respiratory specimens during the acute phase of infection. Negative results do not preclude SARS-CoV-2 infection, do not rule out co-infections with other pathogens, and should not be used as the sole basis for treatment or other patient management decisions. Negative results must be combined with clinical observations, patient history, and epidemiological information. The expected result is Negative. Fact Sheet for Patients: SugarRoll.be Fact Sheet for Healthcare Providers: https://www.woods-mathews.com/ This test is not yet approved or cleared by the [place] FDA and  has been authorized for detection and/or diagnosis of SARS-CoV-2 by FDA under an Emergency Use Authorization (EUA). This EUA will remain  in effect (meaning this test can be used) for the duration of the COVID-19 declaration under Section 56 4(b)(1) of the Act, 21 U.S.C. section 360bbb-3(b)(1), unless the authorization is terminated or revoked sooner. Performed at [facility], [place] [address], [place], [place] [address]          Radiology Studies: No results found.    Scheduled Meds: . acetaminophen  500 mg Oral Q8H  . amLODipine  5 mg Oral Daily  . carbidopa-levodopa  1 tablet Oral TID  . docusate sodium  100 mg Oral BID  . feeding supplement (ENSURE ENLIVE)  237 mL Oral BID BM  . lisinopril  20 mg Oral Daily  . rivaroxaban  20 mg Oral Q supper   Continuous Infusions:   LOS: 9 days      Debbe Odea, MD [facility] Pager: [email or web address] Password [email or web address] 12/23/2018, 7:20 AM

## 2018-12-23 NOTE — Plan of Care (Signed)

## 2018-12-23 NOTE — TOC Progression Note (Signed)
Transition of Care [facility]) - Progression Note    Patient Details  Name: SIMAO SAPUTO MRN: [id number] Date of Birth: 1927-01-10  Transition of Care [facility]) CM/SW [place], [place] Phone Number: [phone] 12/23/2018, 11:35 AM  Clinical Narrative:     CSW followed up with April at [facility] and patient's authorization is not back. CSW asked April if she could alert CSW when authorization comes back.  CSW will continue to assist with discharge planning needs.   Expected Discharge Plan: Skilled Nursing Facility Barriers to Discharge: Ship broker, SNF Pending bed offer  Expected Discharge Plan and Services Expected Discharge Plan: [place] In-house Referral: NA Discharge Planning Services: CM Consult Post Acute Care Choice: [place] Living arrangements for the past 2 months: Single Family Home                 DME Arranged: N/A DME Agency: NA       HH Arranged: NA HH Agency: NA         Social Determinants of Health (SDOH) Interventions    Readmission Risk Interventions No flowsheet data found.

## 2018-12-24 ENCOUNTER — Encounter (HOSPITAL_COMMUNITY): Payer: Self-pay | Admitting: *Deleted

## 2018-12-24 LAB — BASIC METABOLIC PANEL
Anion gap: 9 (ref 5–15)
BUN: 21 mg/dL (ref 8–23)
CO2: 25 mmol/L (ref 22–32)
Calcium: 8.7 mg/dL — ABNORMAL LOW (ref 8.9–10.3)
Chloride: 108 mmol/L (ref 98–111)
Creatinine, Ser: 0.81 mg/dL (ref 0.61–1.24)
GFR calc Af Amer: >60 mL/min (ref >60)
GFR calc non Af Amer: >60 mL/min (ref >60)
Glucose, Bld: 88 mg/dL (ref 70–99)
Potassium: 4.2 mmol/L (ref 3.5–5.1)
Sodium: 142 mmol/L (ref 135–145)

## 2018-12-24 NOTE — Progress Notes (Signed)
As per MD request, bed overlay placed under patient.

## 2018-12-24 NOTE — Progress Notes (Signed)
PROGRESS NOTE    Chad Russell   [id number]  DOB: 1926-10-27  DOA: 12/14/2018 PCP: [name], MD   Brief Narrative:  Chad Russell 85 y.o.malewith medical history significant of Parkinson's disease, complete heart block(x20 years with no indication for pacemaker), CAD, atrial fibrillation on Xarelto, hypertension, OSA not on CPAP. He presents with left hip pain status post mechanical fall the night before.  Found to have a left femoral neck fracture and underwent left hip hemiarthroplasty on 10/24.   Subjective: His tailbone is sore today. Has no other complaints. Complementary of his care.   Assessment & Plan:   Principal Problem:   Fracture of femoral neck, left  -Status post left hip arthroplasty on 10/24 -Xarelto has been resumed  - sutures to be removed on POD 14  Active Problems: Acute blood loss anemia -Hemoglobin is stable around 10 and is not dropping further therefore hold off on transfusion    Atrial fibrillation with junctional escape rhythm and bradycardia H/o complete heart block - last seen by Dr Percival Spanish on 10/2-  At that time the patient was decided on having elective hip surgery- Dr Warren Lacy did not feel that his cardiac issues should prohibit surgery. - no indication for pacemaker -Continue Xarelto  Laceration left ear - sutured in ED - skin has healed - appreciate ortho PA for removing sutures 10/30  Hypertension -Resumed lisinopril- BP quite elevated - he has not been in pain- increase Lisinopril to to 20 mg from 10mg  and add 5 mg of Norvasc today -Continue to hold HCTZ    AKI (acute kidney injury) - possibly dehydration- holding HCTZ -presented was 1.42 on admission and has improved to 0.87  Soreness in sacral area - has difficulty laying on his side due to fracture- have asked RN to order a gel overlay.   Time spent in minutes: 35 DVT prophylaxis: Xarelto Code Status: Full Code Family Communication:  Disposition  Plan:   he is a 2 person assist and will need SNF Consultants:   Ortho Procedures:   Left hip arthroplasty Antimicrobials:  Anti-infectives (From admission, onward)   Start     Dose/Rate Route Frequency Ordered Stop   12/16/18 1400  ceFAZolin (ANCEF) IVPB 2g/100 mL premix    Note to Pharmacy: Tolerated ancef in OR   2 g 200 mL/hr over 30 Minutes Intravenous Every 6 hours 12/16/18 1201 12/16/18 2027   12/16/18 0802  ceFAZolin (ANCEF) 2-4 GM/100ML-% IVPB    Note to Pharmacy: Rejeana Brock   : cabinet override      12/16/18 0802 12/16/18 2014       Objective: Vitals:   12/23/18 2010 12/24/18 0454 12/24/18 0845 12/24/18 1400  BP: (!) 147/73 (!) 142/70 (!) 165/69 (!) 155/76  Pulse: (!) 42 (!) 40 (!) 34 (!) 36  Resp: 16 16 14 13   Temp: 98.3 F (36.8 C) 97.8 F (36.6 C) (!) 97.5 F (36.4 C) 97.8 F (36.6 C)  TempSrc: Oral Oral Oral Oral  SpO2: 100% 97% 99% 99%  Weight:      Height:        Intake/Output Summary (Last 24 hours) at 12/24/2018 1415 Last data filed at 12/24/2018 1012 Gross per 24 hour  Intake 120 ml  Output 100 ml  Net 20 ml   Filed Weights   12/15/18 0324  Weight: 78.6 kg    Examination: General exam: Appears comfortable  HEENT: PERRLA, oral mucosa moist, no sclera icterus or thrush Respiratory system: Clear to auscultation.  Respiratory effort normal. Cardiovascular system: S1 & S2 heard,  No murmurs  Gastrointestinal system: Abdomen soft, non-tender, nondistended. Normal bowel sounds   Central nervous system: Alert and oriented. No focal neurological deficits. Extremities: No cyanosis, clubbing or edema Skin: No rashes or ulcers Psychiatry:  Mood & affect appropriate.   Data Reviewed: I have personally reviewed following labs and imaging studies  CBC: Recent Labs  Lab 12/18/18 0405 12/19/18 0323 12/20/18 0206  WBC 10.5 9.1 9.0  HGB 10.6* 10.7* 10.1*  HCT 32.7* 32.7* 31.2*  MCV 100.0 100.9* 100.3*  PLT 164 195 [id number]   Basic Metabolic Panel:  Recent Labs  Lab 12/18/18 0405 12/19/18 0323 12/24/18 0422  NA 141 142 142  K 4.1 3.9 4.2  CL 109 111 108  CO2 25 23 25   GLUCOSE 104* 101* 88  BUN 30* 31* 21  CREATININE 0.99 0.87 0.81  CALCIUM 8.5* 8.7* 8.7*  MG  --  2.0  --    GFR: Estimated Creatinine Clearance: 64.6 mL/min (by C-G formula based on SCr of 0.81 mg/dL). Liver Function Tests: No results for input(s): AST, ALT, ALKPHOS, BILITOT, PROT, ALBUMIN in the last 168 hours. No results for input(s): LIPASE, AMYLASE in the last 168 hours. No results for input(s): AMMONIA in the last 168 hours. Coagulation Profile: No results for input(s): INR, PROTIME in the last 168 hours. Cardiac Enzymes: No results for input(s): CKTOTAL, CKMB, CKMBINDEX, TROPONINI in the last 168 hours. BNP (last 3 results) No results for input(s): PROBNP in the last 8760 hours. HbA1C: No results for input(s): HGBA1C in the last 72 hours. CBG: Recent Labs  Lab 12/19/18 0646  GLUCAP 97   Lipid Profile: No results for input(s): CHOL, HDL, LDLCALC, TRIG, CHOLHDL, LDLDIRECT in the last 72 hours. Thyroid Function Tests: No results for input(s): TSH, T4TOTAL, FREET4, T3FREE, THYROIDAB in the last 72 hours. Anemia Panel: No results for input(s): VITAMINB12, FOLATE, FERRITIN, TIBC, IRON, RETICCTPCT in the last 72 hours. Urine analysis:    Component Value Date/Time   COLORURINE YELLOW 04/19/2017 [place] 04/19/2017 1737   LABSPEC 1.025 04/19/2017 1737   PHURINE 5.0 04/19/2017 1737   GLUCOSEU NEGATIVE 04/19/2017 1737   HGBUR NEGATIVE 04/19/2017 1737   BILIRUBINUR negative 04/01/2018 1148   KETONESUR negative 03/28/2018 1116   KETONESUR 20 (A) 04/19/2017 1737   PROTEINUR Positive (A) 04/01/2018 1148   PROTEINUR NEGATIVE 04/19/2017 1737   UROBILINOGEN 0.2 04/01/2018 1148   UROBILINOGEN 1.0 04/27/2012 0019   NITRITE negative 04/01/2018 1148   NITRITE NEGATIVE 04/19/2017 1737   LEUKOCYTESUR Trace (A) 04/01/2018 1148   Sepsis  Labs: @LABRCNTIP (procalcitonin:4,lacticidven:4) ) Recent Results (from the past 240 hour(s))  SARS CORONAVIRUS 2 (TAT 6-24 HRS) Nasopharyngeal Nasopharyngeal Swab     Status: None   Collection Time: 12/14/18  9:39 PM   Specimen: Nasopharyngeal Swab  Result Value Ref Range Status   SARS Coronavirus 2 NEGATIVE NEGATIVE Final    Comment: (NOTE) SARS-CoV-2 target nucleic acids are NOT DETECTED. The SARS-CoV-2 RNA is generally detectable in upper and lower respiratory specimens during the acute phase of infection. Negative results do not preclude SARS-CoV-2 infection, do not rule out co-infections with other pathogens, and should not be used as the sole basis for treatment or other patient management decisions. Negative results must be combined with clinical observations, patient history, and epidemiological information. The expected result is Negative. Fact Sheet for Patients: SugarRoll.be Fact Sheet for Healthcare Providers: https://www.woods-mathews.com/ This test is not yet approved or cleared  by the [place] and  has been authorized for detection and/or diagnosis of SARS-CoV-2 by FDA under an Emergency Use Authorization (EUA). This EUA will remain  in effect (meaning this test can be used) for the duration of the COVID-19 declaration under Section 56 4(b)(1) of the Act, 21 U.S.C. section 360bbb-3(b)(1), unless the authorization is terminated or revoked sooner. Performed at [facility], [place] [address], [place], [place] [address]   Surgical PCR screen     Status: None   Collection Time: 12/15/18  2:04 AM   Specimen: Nasal Mucosa; Nasal Swab  Result Value Ref Range Status   MRSA, PCR NEGATIVE NEGATIVE Final   Staphylococcus aureus NEGATIVE NEGATIVE Final    Comment: (NOTE) The Xpert SA Assay (FDA approved for NASAL specimens in patients 45 years of age and older), is one component of a comprehensive surveillance program.  It is not intended to diagnose infection nor to guide or monitor treatment. Performed at [facility], [place] [address], [place], [place] [address]   SARS CORONAVIRUS 2 (TAT 6-24 HRS) Nasopharyngeal Nasopharyngeal Swab     Status: None   Collection Time: 12/21/18  5:26 PM   Specimen: Nasopharyngeal Swab  Result Value Ref Range Status   SARS Coronavirus 2 NEGATIVE NEGATIVE Final    Comment: (NOTE) SARS-CoV-2 target nucleic acids are NOT DETECTED. The SARS-CoV-2 RNA is generally detectable in upper and lower respiratory specimens during the acute phase of infection. Negative results do not preclude SARS-CoV-2 infection, do not rule out co-infections with other pathogens, and should not be used as the sole basis for treatment or other patient management decisions. Negative results must be combined with clinical observations, patient history, and epidemiological information. The expected result is Negative. Fact Sheet for Patients: SugarRoll.be Fact Sheet for Healthcare Providers: https://www.woods-mathews.com/ This test is not yet approved or cleared by the [place] FDA and  has been authorized for detection and/or diagnosis of SARS-CoV-2 by FDA under an Emergency Use Authorization (EUA). This EUA will remain  in effect (meaning this test can be used) for the duration of the COVID-19 declaration under Section 56 4(b)(1) of the Act, 21 U.S.C. section 360bbb-3(b)(1), unless the authorization is terminated or revoked sooner. Performed at [facility], [place] [address] [place], [place] [address]          Radiology Studies: No results found.    Scheduled Meds: . acetaminophen  500 mg Oral Q8H  . amLODipine  5 mg Oral Daily  . carbidopa-levodopa  1 tablet Oral TID  . docusate sodium  100 mg Oral BID  . feeding supplement (ENSURE ENLIVE)  237 mL Oral BID BM  . lisinopril  20 mg Oral Daily  . rivaroxaban  20 mg Oral Q  supper   Continuous Infusions:   LOS: 10 days      Debbe Odea, MD [facility] Pager: [email or web address] Password [email or web address] 12/24/2018, 2:15 PM

## 2018-12-24 NOTE — Progress Notes (Signed)
Pt's BP elevated. MD made aware.

## 2018-12-25 LAB — SARS CORONAVIRUS 2 (TAT 6-24 HRS): SARS Coronavirus 2: NEGATIVE

## 2018-12-25 MED ORDER — AMLODIPINE BESYLATE 10 MG PO TABS
10.0000 mg | ORAL_TABLET | Freq: Every day | ORAL | Status: DC
  Administered 2018-12-25 – 2018-12-26 (×2): 10 mg via ORAL
  Filled 2018-12-25 (×2): qty 1

## 2018-12-25 NOTE — Care Management (Addendum)
CM informed by the SNF liaison that additional PT notes and COVID test are needed. PT notes within 48hrs is needed for insurance auth (last PT note 10/30); facility requires a COVID test within 48hrs (last COVID test resulted 10/29) of admission. CM updated Dr. Wynelle Cleveland.   ADDENDUM: 12/25/18 @ 1315-Sequita Wise RNCM-PT notes faxed to [employer] for [employer].  Midge Minium RN, BSN, NCM-BC, ACM-RN [phone]

## 2018-12-25 NOTE — Progress Notes (Signed)
PROGRESS NOTE    NICKLES BLACKLEDGE   [id number]  DOB: 1926/05/13  DOA: 12/14/2018 PCP: [name], MD   Brief Narrative:  [name] 85 y.o.malewith medical history significant of Parkinson's disease, complete heart block(x20 years with no indication for pacemaker), CAD, atrial fibrillation on Xarelto, hypertension, OSA not on CPAP. He presents with left hip pain status post mechanical fall the night before.  Found to have a left femoral neck fracture and underwent left hip hemiarthroplasty on 10/24.   Subjective: No new complaints.   Assessment & Plan:   Principal Problem:   Fracture of femoral neck, left  -Status post left hip arthroplasty on 10/24 -Xarelto has been resumed  - sutures to be removed on POD 14  Active Problems: Acute blood loss anemia -Hemoglobin is stable around 10 and is not dropping further therefore hold off on transfusion    Atrial fibrillation with junctional escape rhythm and bradycardia H/o complete heart block - last seen by Dr Percival Spanish on 10/2-  At that time the patient was decided on having elective hip surgery- Dr Warren Lacy did not feel that his cardiac issues should prohibit surgery. - no indication for pacemaker -Continue Xarelto  Laceration left ear - sutured in ED - skin has healed - appreciate ortho PA for removing sutures 10/30  Hypertension -Resumed lisinopril- BP quite elevated - he has not been in pain- increase Lisinopril to to 20 mg from 10mg  and add 5 mg of Norvasc today -Continue to hold HCTZ - BP has been high for many days although his pain has improved- increase Amdlodipine to 10 mg daily today    AKI (acute kidney injury) - possibly dehydration- holding HCTZ -presented was 1.42 on admission and has improved to 0.87  Soreness in sacral area - has difficulty laying on his side due to fracture- have asked RN to order a gel overlay.   Time spent in minutes: 35 DVT prophylaxis: Xarelto Code Status:  Full Code Family Communication:  Disposition Plan:   he is a 2 person assist - still awaiting SNF Consultants:   Ortho Procedures:   Left hip arthroplasty Antimicrobials:  Anti-infectives (From admission, onward)   Start     Dose/Rate Route Frequency Ordered Stop   12/16/18 1400  ceFAZolin (ANCEF) IVPB 2g/100 mL premix    Note to Pharmacy: Tolerated ancef in OR   2 g 200 mL/hr over 30 Minutes Intravenous Every 6 hours 12/16/18 1201 12/16/18 2027   12/16/18 0802  ceFAZolin (ANCEF) 2-4 GM/100ML-% IVPB    Note to Pharmacy: Rejeana Brock   : cabinet override      12/16/18 0802 12/16/18 2014       Objective: Vitals:   12/24/18 2028 12/25/18 0443 12/25/18 0824 12/25/18 0825  BP: (!) 143/77 (!) 144/75 (!) 166/70 (!) 166/70  Pulse: (!) 41 (!) 40    Resp: 18 18    Temp: 98.2 F (36.8 C) 98.4 F (36.9 C)    TempSrc: Oral Oral    SpO2: 100% 100%    Weight:      Height:       No intake or output data in the 24 hours ending 12/25/18 1355 Filed Weights   12/15/18 0324  Weight: 78.6 kg    Examination: General exam: Appears comfortable  HEENT: PERRLA, oral mucosa moist, no sclera icterus or thrush Respiratory system: Clear to auscultation. Respiratory effort normal. Cardiovascular system: S1 & S2 heard,  No murmurs  Gastrointestinal system: Abdomen soft, non-tender, nondistended. Normal  bowel sounds   Central nervous system: Alert and oriented. No focal neurological deficits. Extremities: No cyanosis, clubbing or edema Skin: No rashes or ulcers- incision on left face healing well Psychiatry:  Mood & affect appropriate.   Data Reviewed: I have personally reviewed following labs and imaging studies  CBC: Recent Labs  Lab 12/19/18 0323 12/20/18 0206  WBC 9.1 9.0  HGB 10.7* 10.1*  HCT 32.7* 31.2*  MCV 100.9* 100.3*  PLT 195 [id number]   Basic Metabolic Panel: Recent Labs  Lab 12/19/18 0323 12/24/18 0422  NA 142 142  K 3.9 4.2  CL 111 108  CO2 23 25  GLUCOSE 101* 88   BUN 31* 21  CREATININE 0.87 0.81  CALCIUM 8.7* 8.7*  MG 2.0  --    GFR: Estimated Creatinine Clearance: 64.6 mL/min (by C-G formula based on SCr of 0.81 mg/dL). Liver Function Tests: No results for input(s): AST, ALT, ALKPHOS, BILITOT, PROT, ALBUMIN in the last 168 hours. No results for input(s): LIPASE, AMYLASE in the last 168 hours. No results for input(s): AMMONIA in the last 168 hours. Coagulation Profile: No results for input(s): INR, PROTIME in the last 168 hours. Cardiac Enzymes: No results for input(s): CKTOTAL, CKMB, CKMBINDEX, TROPONINI in the last 168 hours. BNP (last 3 results) No results for input(s): PROBNP in the last 8760 hours. HbA1C: No results for input(s): HGBA1C in the last 72 hours. CBG: Recent Labs  Lab 12/19/18 0646  GLUCAP 97   Lipid Profile: No results for input(s): CHOL, HDL, LDLCALC, TRIG, CHOLHDL, LDLDIRECT in the last 72 hours. Thyroid Function Tests: No results for input(s): TSH, T4TOTAL, FREET4, T3FREE, THYROIDAB in the last 72 hours. Anemia Panel: No results for input(s): VITAMINB12, FOLATE, FERRITIN, TIBC, IRON, RETICCTPCT in the last 72 hours. Urine analysis:    Component Value Date/Time   COLORURINE YELLOW 04/19/2017 [place] 04/19/2017 1737   LABSPEC 1.025 04/19/2017 1737   PHURINE 5.0 04/19/2017 1737   GLUCOSEU NEGATIVE 04/19/2017 1737   HGBUR NEGATIVE 04/19/2017 1737   BILIRUBINUR negative 04/01/2018 1148   KETONESUR negative 03/28/2018 1116   KETONESUR 20 (A) 04/19/2017 1737   PROTEINUR Positive (A) 04/01/2018 1148   PROTEINUR NEGATIVE 04/19/2017 1737   UROBILINOGEN 0.2 04/01/2018 1148   UROBILINOGEN 1.0 04/27/2012 0019   NITRITE negative 04/01/2018 1148   NITRITE NEGATIVE 04/19/2017 1737   LEUKOCYTESUR Trace (A) 04/01/2018 1148   Sepsis Labs: @LABRCNTIP (procalcitonin:4,lacticidven:4) ) Recent Results (from the past 240 hour(s))  SARS CORONAVIRUS 2 (TAT 6-24 HRS) Nasopharyngeal Nasopharyngeal Swab      Status: None   Collection Time: 12/21/18  5:26 PM   Specimen: Nasopharyngeal Swab  Result Value Ref Range Status   SARS Coronavirus 2 NEGATIVE NEGATIVE Final    Comment: (NOTE) SARS-CoV-2 target nucleic acids are NOT DETECTED. The SARS-CoV-2 RNA is generally detectable in upper and lower respiratory specimens during the acute phase of infection. Negative results do not preclude SARS-CoV-2 infection, do not rule out co-infections with other pathogens, and should not be used as the sole basis for treatment or other patient management decisions. Negative results must be combined with clinical observations, patient history, and epidemiological information. The expected result is Negative. Fact Sheet for Patients: SugarRoll.be Fact Sheet for Healthcare Providers: https://www.woods-mathews.com/ This test is not yet approved or cleared by the [place] FDA and  has been authorized for detection and/or diagnosis of SARS-CoV-2 by FDA under an Emergency Use Authorization (EUA). This EUA will remain  in effect (meaning this test  can be used) for the duration of the COVID-19 declaration under Section 56 4(b)(1) of the Act, 21 U.S.C. section 360bbb-3(b)(1), unless the authorization is terminated or revoked sooner. Performed at [facility], [place] [address], [place], [place] [address]          Radiology Studies: No results found.    Scheduled Meds: . acetaminophen  500 mg Oral Q8H  . amLODipine  10 mg Oral Daily  . carbidopa-levodopa  1 tablet Oral TID  . docusate sodium  100 mg Oral BID  . feeding supplement (ENSURE ENLIVE)  237 mL Oral BID BM  . lisinopril  20 mg Oral Daily  . rivaroxaban  20 mg Oral Q supper   Continuous Infusions:   LOS: 11 days      Debbe Odea, MD [facility] Pager: [email or web address] Password [email or web address] 12/25/2018, 1:55 PM

## 2018-12-25 NOTE — Progress Notes (Signed)
Physical Therapy Treatment Patient Details Name: Chad Russell MRN: [id number] DOB: [birth year] Today's Date: 12/25/2018    History of Present Illness 85 y.o. male with medical history significant of Parkinson's disease, complete heart block (x 20 years with no indication for pacemaker), CAD, atrial fibrillation on Xarelto, hypertension, OSA not on CPAP.  He presented on 10/22 with left hip pain status post a mechanical fall the night before.  He reported a slip in the foyer of his home when not using his walker, resulting in left femoral neck fracture. Now, s/p L hip hemiarthroplasty, posterior hip precautions, WBAT    PT Comments    Patient very motivated. Very stiff and required increased time with ROM/strengthening exercises in supine prior to mobilizing with 1 person assist. Patient able to stand from elevated bed to [facility] (bed elevated to reduce hip flexion due to precautions) +1 mod assist. Stood from [employer] seat with min assist and slowly, controlled lowered himself to sitting in recliner (with built up seat/elevated). Wife present throughout session. Both in agreement with rehab at SNF level since insurance denied CIR.     Follow Up Recommendations  Supervision/Assistance - 24 hour;SNF     Equipment Recommendations  None recommended by PT    Recommendations for Other Services       Precautions / Restrictions Precautions Precautions: Fall;Posterior Hip Precaution Booklet Issued: Yes (comment) Precaution Comments: Able to state 2/3 precautions; reinstructed Restrictions Weight Bearing Restrictions: Yes LLE Weight Bearing: Weight bearing as tolerated    Mobility  Bed Mobility Overal bed mobility: Needs Assistance Bed Mobility: Supine to Sit     Supine to sit: Mod assist     General bed mobility comments: after ROM/LE exercises, pt able to scoot hips laterally to his rt to EOB with min assist, pivot and pull up to sitting (from [facility] elevated 30, with rail)    Transfers Overall transfer level: Needs assistance   Transfers: Sit to/from Stand Sit to Stand: Min assist;Mod assist(pulling up on [place])         General transfer comment: initial stand from EOB mod assist to achieve upright posture in midline to allow seat of stedy to drop (bariatric stedy not available); stood from seat of stedy with min assist and sat in recliner with seat built up to incr ease of standing from chair  Ambulation/Gait             General Gait Details: unable due to lack of +2 assist   Stairs             Wheelchair Mobility    Modified Rankin (Stroke Patients Only)       Balance Overall balance assessment: Needs assistance;History of Falls Sitting-balance support: Bilateral upper extremity supported;Feet supported Sitting balance-Leahy Scale: Poor Sitting balance - Comments: MinA to maintain upright due to posterior lean; pt feels he is leaning forward too much and leans posteriorly Postural control: Posterior lean Standing balance support: Bilateral upper extremity supported Standing balance-Leahy Scale: Poor Standing balance comment: initial posterior and rt lean; with assist and cues, pt able to find mdline with more erect posture and looking straight ahead and held x 90 sec                            Cognition Arousal/Alertness: Awake/alert Behavior During Therapy: WFL for tasks assessed/performed Overall Cognitive Status: Within Functional Limits for tasks assessed  Exercises Total Joint Exercises Ankle Circles/Pumps: AROM;Both;10 reps Quad Sets: AROM;Right;Left;10 reps Gluteal Sets: AROM;Both;10 reps(x5 reps with blanket roll under left knee for hip extension) Heel Slides: Right;Left;10 reps;AAROM(with resisted extension last 5 reps) Hip ABduction/ADduction: Left;10 reps;AAROM;Right Straight Leg Raises: AAROM;Left;5 reps    General Comments        Pertinent  Vitals/Pain Pain Assessment: No/denies pain    Home Living                      Prior Function            PT Goals (current goals can now be found in the care plan section) Acute Rehab PT Goals Patient Stated Goal: go to SNF to regain strength, and get home PT Goal Formulation: With patient Time For Goal Achievement: 12/31/18 Potential to Achieve Goals: Fair Progress towards PT goals: Progressing toward goals    Frequency    Min 3X/week      PT Plan Discharge plan needs to be updated    Co-evaluation              AM-PAC PT "6 Clicks" Mobility   Outcome Measure  Help needed turning from your back to your side while in a flat bed without using bedrails?: A Lot Help needed moving from lying on your back to sitting on the side of a flat bed without using bedrails?: A Lot Help needed moving to and from a bed to a chair (including a wheelchair)?: A Lot Help needed standing up from a chair using your arms (e.g., wheelchair or bedside chair)?: A Lot Help needed to walk in hospital room?: A Lot Help needed climbing 3-5 steps with a railing? : Total 6 Click Score: 11    End of Session Equipment Utilized During Treatment: Gait belt Activity Tolerance: Patient tolerated treatment well Patient left: in chair;with call bell/phone within reach;with chair alarm set;with family/visitor present Nurse Communication: Mobility status;Other (comment)(did well with stedy) PT Visit Diagnosis: Unsteadiness on feet (R26.81);Other abnormalities of gait and mobility (R26.89);Repeated falls (R29.6);Muscle weakness (generalized) (M62.81);History of falling (Z91.81)     Time: 1015-1130 PT Time Calculation (min) (ACUTE ONLY): 75 min  Charges:  $Therapeutic Exercise: 23-37 mins $Therapeutic Activity: 38-52 mins                      Barry Brunner, PT Pager [phone]    Rexanne Mano 12/25/2018, 12:44 PM

## 2018-12-25 NOTE — Plan of Care (Signed)
  Problem: Education: Goal: Knowledge of General Education information will improve Description: Including pain rating scale, medication(s)/side effects and non-pharmacologic comfort measures Outcome: Progressing   Problem: Clinical Measurements: Goal: Will remain free from infection Outcome: Progressing Goal: Cardiovascular complication will be avoided Outcome: Progressing   Problem: Elimination: Goal: Will not experience complications related to bowel motility Outcome: Progressing   Problem: Pain Managment: Goal: General experience of comfort will improve Outcome: Progressing   Problem: Safety: Goal: Ability to remain free from injury will improve Outcome: Progressing   Problem: Skin Integrity: Goal: Risk for impaired skin integrity will decrease Outcome: Progressing

## 2018-12-26 DIAGNOSIS — S72002D Fracture of unspecified part of neck of left femur, subsequent encounter for closed fracture with routine healing: Secondary | ICD-10-CM | POA: Diagnosis not present

## 2018-12-26 DIAGNOSIS — N401 Enlarged prostate with lower urinary tract symptoms: Secondary | ICD-10-CM | POA: Diagnosis not present

## 2018-12-26 DIAGNOSIS — M80052D Age-related osteoporosis with current pathological fracture, left femur, subsequent encounter for fracture with routine healing: Secondary | ICD-10-CM | POA: Diagnosis not present

## 2018-12-26 DIAGNOSIS — D649 Anemia, unspecified: Secondary | ICD-10-CM | POA: Diagnosis not present

## 2018-12-26 DIAGNOSIS — M2550 Pain in unspecified joint: Secondary | ICD-10-CM | POA: Diagnosis not present

## 2018-12-26 DIAGNOSIS — D500 Iron deficiency anemia secondary to blood loss (chronic): Secondary | ICD-10-CM | POA: Diagnosis not present

## 2018-12-26 DIAGNOSIS — I482 Chronic atrial fibrillation, unspecified: Secondary | ICD-10-CM | POA: Diagnosis not present

## 2018-12-26 DIAGNOSIS — Z03818 Encounter for observation for suspected exposure to other biological agents ruled out: Secondary | ICD-10-CM | POA: Diagnosis not present

## 2018-12-26 DIAGNOSIS — W1830XA Fall on same level, unspecified, initial encounter: Secondary | ICD-10-CM | POA: Diagnosis not present

## 2018-12-26 DIAGNOSIS — N400 Enlarged prostate without lower urinary tract symptoms: Secondary | ICD-10-CM | POA: Diagnosis not present

## 2018-12-26 DIAGNOSIS — N19 Unspecified kidney failure: Secondary | ICD-10-CM | POA: Diagnosis not present

## 2018-12-26 DIAGNOSIS — M6281 Muscle weakness (generalized): Secondary | ICD-10-CM | POA: Diagnosis not present

## 2018-12-26 DIAGNOSIS — R52 Pain, unspecified: Secondary | ICD-10-CM | POA: Diagnosis not present

## 2018-12-26 DIAGNOSIS — N179 Acute kidney failure, unspecified: Secondary | ICD-10-CM | POA: Diagnosis not present

## 2018-12-26 DIAGNOSIS — E559 Vitamin D deficiency, unspecified: Secondary | ICD-10-CM | POA: Diagnosis not present

## 2018-12-26 DIAGNOSIS — G609 Hereditary and idiopathic neuropathy, unspecified: Secondary | ICD-10-CM | POA: Diagnosis not present

## 2018-12-26 DIAGNOSIS — Z1159 Encounter for screening for other viral diseases: Secondary | ICD-10-CM | POA: Diagnosis not present

## 2018-12-26 DIAGNOSIS — K5900 Constipation, unspecified: Secondary | ICD-10-CM | POA: Diagnosis not present

## 2018-12-26 DIAGNOSIS — I10 Essential (primary) hypertension: Secondary | ICD-10-CM | POA: Diagnosis not present

## 2018-12-26 DIAGNOSIS — H42 Glaucoma in diseases classified elsewhere: Secondary | ICD-10-CM | POA: Diagnosis not present

## 2018-12-26 DIAGNOSIS — R001 Bradycardia, unspecified: Secondary | ICD-10-CM | POA: Diagnosis not present

## 2018-12-26 DIAGNOSIS — S72002A Fracture of unspecified part of neck of left femur, initial encounter for closed fracture: Secondary | ICD-10-CM | POA: Diagnosis not present

## 2018-12-26 DIAGNOSIS — N138 Other obstructive and reflux uropathy: Secondary | ICD-10-CM

## 2018-12-26 DIAGNOSIS — D472 Monoclonal gammopathy: Secondary | ICD-10-CM

## 2018-12-26 DIAGNOSIS — R499 Unspecified voice and resonance disorder: Secondary | ICD-10-CM | POA: Diagnosis not present

## 2018-12-26 DIAGNOSIS — I4891 Unspecified atrial fibrillation: Secondary | ICD-10-CM | POA: Diagnosis not present

## 2018-12-26 DIAGNOSIS — Z9181 History of falling: Secondary | ICD-10-CM | POA: Diagnosis not present

## 2018-12-26 DIAGNOSIS — Z23 Encounter for immunization: Secondary | ICD-10-CM | POA: Diagnosis not present

## 2018-12-26 DIAGNOSIS — I442 Atrioventricular block, complete: Secondary | ICD-10-CM | POA: Diagnosis not present

## 2018-12-26 DIAGNOSIS — G600 Hereditary motor and sensory neuropathy: Secondary | ICD-10-CM | POA: Diagnosis not present

## 2018-12-26 DIAGNOSIS — G20 Parkinson's disease: Secondary | ICD-10-CM | POA: Diagnosis not present

## 2018-12-26 DIAGNOSIS — Z7401 Bed confinement status: Secondary | ICD-10-CM | POA: Diagnosis not present

## 2018-12-26 DIAGNOSIS — M9702XA Periprosthetic fracture around internal prosthetic left hip joint, initial encounter: Secondary | ICD-10-CM | POA: Diagnosis not present

## 2018-12-26 DIAGNOSIS — R05 Cough: Secondary | ICD-10-CM | POA: Diagnosis not present

## 2018-12-26 DIAGNOSIS — R2689 Other abnormalities of gait and mobility: Secondary | ICD-10-CM | POA: Diagnosis not present

## 2018-12-26 DIAGNOSIS — F0280 Dementia in other diseases classified elsewhere without behavioral disturbance: Secondary | ICD-10-CM | POA: Diagnosis not present

## 2018-12-26 MED ORDER — SENNA 8.6 MG PO TABS: 1.0000 | ORAL_TABLET | Freq: Every evening | ORAL | 0 refills | Status: DC | PRN

## 2018-12-26 MED ORDER — ACETAMINOPHEN 500 MG PO TABS: 500.0000 mg | ORAL_TABLET | Freq: Three times a day (TID) | ORAL | 0 refills | Status: DC

## 2018-12-26 MED ORDER — LISINOPRIL 20 MG PO TABS: 20.0000 mg | ORAL_TABLET | Freq: Every day | ORAL | Status: DC

## 2018-12-26 MED ORDER — AMLODIPINE BESYLATE 10 MG PO TABS: 10.0000 mg | ORAL_TABLET | Freq: Every day | ORAL | Status: DC

## 2018-12-26 MED ORDER — BISACODYL 5 MG PO TBEC: 5.0000 mg | DELAYED_RELEASE_TABLET | Freq: Every day | ORAL | 0 refills | Status: DC | PRN

## 2018-12-26 MED ORDER — ENSURE ENLIVE PO LIQD: 237.0000 mL | Freq: Two times a day (BID) | ORAL | 12 refills | Status: DC

## 2018-12-26 NOTE — Plan of Care (Signed)
  Problem: Clinical Measurements: Goal: Cardiovascular complication will be avoided Outcome: Progressing   Problem: Activity: Goal: Risk for activity intolerance will decrease Outcome: Progressing   Problem: Nutrition: Goal: Adequate nutrition will be maintained Outcome: Progressing   Problem: Elimination: Goal: Will not experience complications related to bowel motility Outcome: Progressing   Problem: Safety: Goal: Ability to remain free from injury will improve Outcome: Progressing   Problem: Education: Goal: Knowledge of General Education information will improve Description: Including pain rating scale, medication(s)/side effects and non-pharmacologic comfort measures Outcome: Progressing

## 2018-12-26 NOTE — TOC Transition Note (Addendum)
Transition of Care [facility]) - CM/SW Discharge Note   Patient Details  Name: Chad Russell MRN: [id number] Date of Birth: [birth year]  Transition of Care [facility] CM/SW Contact:  Midge Minium RN, BSN, NCM-BC, ACM-RN [phone] Phone Number: 12/26/2018, 12:55 PM   Clinical Narrative:    Patient medically stable to transition to [facility] SNF for rehab, with insurance auth received. CM provided an update to the patient/spouse with both agreeable to transfer. PTAR will be arranged for 1430 today.  Please call report to: [phone]; room 605   Final next level of care: Skilled Nursing Facility Barriers to Discharge: No Barriers Identified   Patient Goals and CMS Choice Patient states their goals for this hospitalization and ongoing recovery are:: "go to rehab to regain strength, and get home" CMS Medicare.gov Compare Post Acute Care list provided to:: Patient Choice offered to / list presented to : Patient, Spouse  Discharge Placement              Patient chooses bed at: [facility] Patient to be transferred to facility by: [facility] Name of family member notified: patient/spouse Patient and family notified of of transfer: 12/26/18  Discharge Plan and Services In-house Referral: NA Discharge Planning Services: CM Consult Post Acute Care Choice: [place]          DME Arranged: N/A DME Agency: NA       HH Arranged: NA HH Agency: NA        Social Determinants of Health (SDOH) Interventions     Readmission Risk Interventions No flowsheet data found.

## 2018-12-26 NOTE — Progress Notes (Signed)
Nutrition Follow-up  DOCUMENTATION CODES:   Not applicable  INTERVENTION:   -Continue Ensure Enlive po BID, each supplement provides 350 kcal and 20 grams of protein  NUTRITION DIAGNOSIS:   Increased nutrient needs related to post-op healing as evidenced by estimated needs.  Ongoing  GOAL:   Patient will meet greater than or equal to 90% of their needs  Progressing   MONITOR:   PO intake, Supplement acceptance, Skin, Weight trends, Labs, I & O's  REASON FOR ASSESSMENT:   Consult Assessment of nutrition requirement/status  ASSESSMENT:   85 y.o. male with medical history significant of Parkinson's disease, complete heart block (had for 20 years with no indication for pacemaker), CAD, atrial fibrillation on Xarelto, hypertension, OSA not on CPAP with concerns of left hip pain status post a fall.  PROCEDURE (10/24) UNIPOLAR HEMIARTHROPLASTY OF THELEFTHIP   Reviewed I/O's: +480 ml x 24 hours and +7.7 L since admission  Pt remains with good appetite. Noted meal completion 25-100%. Pt is consuming Ensure supplements per MAR.  No new wt to assess since previous assessment.   Per MD notes, plan to d/c to [facility] SNF today.   Labs reviewed: CBGS: 97.   Diet Order:   Diet Order            Diet - low sodium heart healthy        Diet regular Room service appropriate? Yes; Fluid consistency: Thin  Diet effective now              EDUCATION NEEDS:   Not appropriate for education at this time  Skin:  Skin Assessment: Skin Integrity Issues: Skin Integrity Issues:: Incisions Incisions: closed lt hip  Last BM:  12/21/18  Height:   Ht Readings from Last 1 Encounters:  12/15/18 5\' 11"  (1.803 m)    Weight:   Wt Readings from Last 1 Encounters:  12/15/18 78.6 kg    Ideal Body Weight:  78.18 kg  BMI:  Body mass index is 24.18 kg/m.  Estimated Nutritional Needs:   Kcal:  1800-1950  Protein:  85-95 grams  Fluid:  >/= 1.8 L/day    Lakara Weiland A.  Jimmye Norman, RD, LDN, [place] Registered Dietitian II Certified Diabetes Care and Education Specialist Pager: [phone] After hours Pager: [phone]

## 2018-12-26 NOTE — Progress Notes (Signed)
Pt prepped for PTAR transport. Called facility and gave report at 1430. Gathered belongings and returned to pt. Pt denied having any questions.

## 2018-12-26 NOTE — Discharge Summary (Signed)
Physician Discharge Summary  Chad Russell [id number] DOB: 1926-03-28 DOA: 12/14/2018  PCP: [name], MD  Admit date: 12/14/2018 Discharge date: 12/26/2018  Admitted From: home Disposition:  SNF - [facility]  Recommendations for Outpatient Follow-up: f/u BUN/ Cr, monitor for dehydration- HCTZ held  Discharge Condition:  stable   CODE STATUS:  Full code   Diet recommendation:  Heart healthy Consultations:  Orthopedic surgery    Discharge Diagnoses:  Principal Problem:   Fracture of femoral neck, left ([place]) Active Problems:   AKI (acute kidney injury) ([place])   Complete heart block ([facility])   HTN (hypertension)   Atrial fibrillation ([place])   BPH (benign prostatic hyperplasia)   MGUS   H/o peripheral neuropathy  Brief Summary: Chad Russell y.o.malewith medical history significant of Parkinson's disease, complete heart block(x20 years with no indication for pacemaker), CAD, atrial fibrillation on Xarelto, hypertension, OSA not on CPAP, right hip hemiarthroplasty in 2/19. He presents with left hip pain status post mechanical fall the night before.  Found to have a left femoral neck fracture and underwent left hip hemiarthroplasty on 10/24.  Hospital Course:  Principal Problem:   Fracture of femoral neck, left  -Status post left hip arthroplasty on 10/24 -Xarelto has been resumed  - sutures to be removed on POD 14  Active Problems: Acute blood loss anemia -Hemoglobin is stable around 10 and is not dropping further therefore hold off on transfusion    Atrial fibrillation with junctional escape rhythm and bradycardia Complete heart block - last seen by Dr Percival Spanish on 10/2-  At that time the patient was decided on having elective hip surgery- Dr Warren Lacy did not feel that his cardiac issues should prohibit surgery. - no indication for pacemaker -Continue Xarelto - has been using only Tylenol TID (per surgery) for pain     AKI (acute kidney injury)  with mild acidosis likely due to dehydration  -  HCTZ on hold due to dehydration with elevated Creatinine and elevated BUN/Cr ratio -presented was Cr of 1.42 on admission and has improved to 0.87, BUN now 21 which is still a little high- he has no orthostatic symptoms  Laceration left face just proximal to his ear - sutured in ED - skin has healed - appreciate ortho PA for removing sutures 10/30  Hypertension  -Resumed lisinopril- - BP subsequently persistently  elevated - he has not been in pain- increased Lisinopril to to 20 mg from 10 mg, added 5 mg of Norvasc which was later increased to 10 mg -Continue to hold HCTZ  Parkinson's disease - cont Sinemet- managed by Dr Deliah Boston  H/o peripheral neuropathy - follow with Dr Posey Pronto for this as well  H/o MGUS - evaluated by Dr Lebron Conners in 2018 and given this diagnosis- I can't see that he followed up     Discharge Exam: Vitals:   12/25/18 1914 12/26/18 0314  BP: 136/70 (!) 149/74  Pulse: (!) 101 (!) 36  Resp: 18 17  Temp: 98 F (36.7 C)   SpO2: 100% 98%   Vitals:   12/25/18 0825 12/25/18 1607 12/25/18 1914 12/26/18 0314  BP: (!) 166/70 140/68 136/70 (!) 149/74  Pulse:  (!) 36 (!) 101 (!) 36  Resp:  16 18 17   Temp:  97.8 F (36.6 C) 98 F (36.7 C)   TempSrc:  Oral Oral   SpO2:  96% 100% 98%  Weight:      Height:        General: Pt is  alert, awake, not in acute distress Cardiovascular: RRR, S1/S2 +, no rubs, no gallops Respiratory: CTA bilaterally, no wheezing, no rhonchi Abdominal: Soft, NT, ND, bowel sounds + Extremities: no edema, no cyanosis- dressing on left hip not opened- has persistent edema of hip   Discharge Instructions  Discharge Instructions    Diet - low sodium heart healthy   Complete by: As directed    Increase activity slowly   Complete by: As directed      Allergies as of 12/26/2018      Reactions   Doxycycline Other (See Comments)   Upset stomach   Keflex [cephalexin] Other (See  Comments)   Abdominal discomfort 04-19-17 Pt has tolerated orally      Medication List    STOP taking these medications   docusate sodium 100 MG capsule Commonly known as: COLACE   lisinopril-hydrochlorothiazide 10-12.5 MG tablet Commonly known as: ZESTORETIC     TAKE these medications   acetaminophen 500 MG tablet Commonly known as: TYLENOL Take 1 tablet (500 mg total) by mouth every 8 (eight) hours. What changed:   medication strength  how much to take  when to take this  reasons to take this  additional instructions   amLODipine 10 MG tablet Commonly known as: NORVASC Take 1 tablet (10 mg total) by mouth daily.   bisacodyl 5 MG EC tablet Commonly known as: DULCOLAX Take 1 tablet (5 mg total) by mouth daily as needed for moderate constipation.   carbidopa-levodopa 25-100 MG tablet Commonly known as: Sinemet Take 1 tablet by mouth 3 (three) times daily.   feeding supplement (ENSURE ENLIVE) Liqd Take 237 mLs by mouth 2 (two) times daily between meals.   lisinopril 20 MG tablet Commonly known as: ZESTRIL Take 1 tablet (20 mg total) by mouth daily.   multivitamin with minerals Tabs tablet Take 1 tablet daily by mouth.   PRESERVISION AREDS 2+MULTI VIT PO Take 1 capsule by mouth 2 (two) times daily.   Prostate Caps Take 1 tablet daily by mouth. Prostate Plus otc supplement   rivaroxaban 20 MG Tabs tablet Commonly known as: Xarelto Take 1 tablet (20 mg total) by mouth daily with supper.   senna 8.6 MG Tabs tablet Commonly known as: SENOKOT Take 1 tablet (8.6 mg total) by mouth at bedtime as needed for mild constipation.   timolol 0.5 % ophthalmic solution Commonly known as: TIMOPTIC One drop in both eyes in the morning and one drop in the left eye at night. What changed:   how much to take  how to take this  when to take this  additional instructions   vitamin C 500 MG tablet Commonly known as: ASCORBIC ACID Take 500 mg by mouth daily.       Follow-up Information    Altamese Vermillion, MD. Schedule an appointment as soon as possible for a visit on 01/15/2019.   Specialty: Orthopedic Surgery Contact information: [place] [address] [phone]        [facility] Preferred SNF Follow up.   Specialty: [place] [facility]: rehab Contact information: [address] [place] [phone]         Allergies  Allergen Reactions  . Doxycycline Other (See Comments)    Upset stomach  . Keflex [Cephalexin] Other (See Comments)    Abdominal discomfort 04-19-17 Pt has tolerated orally     Procedures/Studies: Left hip hemiarthroplasty Suture of left face  Dg Ribs Unilateral W/chest Left  Result Date: 12/14/2018  CLINICAL DATA:  Left lower rib pain after fall EXAM: LEFT RIBS AND CHEST - 3+ VIEW COMPARISON:  Chest x-ray 04/19/2017 FINDINGS: No fracture or other bone lesions are seen involving the ribs. There is no evidence of pneumothorax or pleural effusion. Bilateral pleural plaques again noted. Stable cardiomegaly. IMPRESSION: No left-sided rib fracture identified. Electronically Signed   By: Davina Poke M.D.   On: 12/14/2018 16:24   Ct Head Wo Contrast  Result Date: 12/14/2018 CLINICAL DATA:  Per ed notes: Pt assisted out of car onto stretcher, here with family who sts he fell last night, injuring L hip. Pt unable to bear weight. Pt taking Xarelto. EXAM: CT HEAD WITHOUT CONTRAST TECHNIQUE: Contiguous axial images were obtained from the base of the skull through the vertex without intravenous contrast. COMPARISON:  None. FINDINGS: Brain: There is central and cortical atrophy. Periventricular white matter changes are consistent with small vessel disease. No evidence of acute infarction, hemorrhage, hydrocephalus, extra-axial collection or mass lesion/mass effect. Vascular: No hyperdense vessel or unexpected calcification. Skull: Normal. Negative for fracture or  focal lesion. Sinuses/Orbits: No acute finding. Other: None. IMPRESSION: 1. No evidence for acute intracranial abnormality. 2. Atrophy and small vessel disease. Electronically Signed   By: Nolon Nations M.D.   On: 12/14/2018 18:16   Dg Hip Unilat With Pelvis 2-3 Views Left  Result Date: 12/16/2018 CLINICAL DATA:  Left hip hemiarthroplasty. EXAM: DG HIP (WITH OR WITHOUT PELVIS) 2-3V LEFT COMPARISON:  Left hip x-rays dated December 14, 2018. FINDINGS: The left hip demonstrates a hemiarthroplasty without evidence of hardware failure or complication. There is expected intra-articular air. There is no fracture or dislocation. The alignment is anatomic. Post-surgical changes noted in the surrounding soft tissues. Prior right hip hemiarthroplasty. IMPRESSION: Interval left hip hemiarthroplasty without acute postoperative complication. Electronically Signed   By: Titus Dubin M.D.   On: 12/16/2018 14:32   Dg Hip Unilat W Or Wo Pelvis 2-3 Views Left  Result Date: 12/14/2018 CLINICAL DATA:  Pt fell last night and experiencing LEFT-sided pain. Pt c/o lower rib pain radiating, L hip pain and L leg pain. EXAM: DG HIP (WITH OR WITHOUT PELVIS) 2-3V LEFT COMPARISON:  01/12/2012 FINDINGS: There is an acute impacted fracture of the LEFT femoral neck, associated with varus angulation. Interval RIGHT hip arthroplasty. Degenerative changes are seen in the LOWER lumbar spine. IMPRESSION: Acute fracture of the LEFT femoral neck. Electronically Signed   By: Nolon Nations M.D.   On: 12/14/2018 16:22   Dg Femur Min 2 Views Left  Result Date: 12/14/2018 CLINICAL DATA:  Pt fell last night and experiencing L sided pain. Pt c/o lower rib pain radiating, L hip pain and L leg pain. EXAM: LEFT FEMUR 2 VIEWS COMPARISON:  01/12/2012 FINDINGS: There is an acute impacted fracture of the LEFT femoral neck, associated with varus angulation. There is dense atherosclerotic calcification of the femoral artery. IMPRESSION: Acute  fracture of the LEFT femoral neck. Electronically Signed   By: Nolon Nations M.D.   On: 12/14/2018 16:21     The results of significant diagnostics from this hospitalization (including imaging, microbiology, ancillary and laboratory) are listed below for reference.     Microbiology: Recent Results (from the past 240 hour(s))  SARS CORONAVIRUS 2 (TAT 6-24 HRS) Nasopharyngeal Nasopharyngeal Swab     Status: None   Collection Time: 12/21/18  5:26 PM   Specimen: Nasopharyngeal Swab  Result Value Ref Range Status   SARS Coronavirus 2 NEGATIVE NEGATIVE Final    Comment: (  NOTE) SARS-CoV-2 target nucleic acids are NOT DETECTED. The SARS-CoV-2 RNA is generally detectable in upper and lower respiratory specimens during the acute phase of infection. Negative results do not preclude SARS-CoV-2 infection, do not rule out co-infections with other pathogens, and should not be used as the sole basis for treatment or other patient management decisions. Negative results must be combined with clinical observations, patient history, and epidemiological information. The expected result is Negative. Fact Sheet for Patients: SugarRoll.be Fact Sheet for Healthcare Providers: https://www.woods-mathews.com/ This test is not yet approved or cleared by the [place] FDA and  has been authorized for detection and/or diagnosis of SARS-CoV-2 by FDA under an Emergency Use Authorization (EUA). This EUA will remain  in effect (meaning this test can be used) for the duration of the COVID-19 declaration under Section 56 4(b)(1) of the Act, 21 U.S.C. section 360bbb-3(b)(1), unless the authorization is terminated or revoked sooner. Performed at [facility], [place] [address], [place], [place] [address]   SARS CORONAVIRUS 2 (TAT 6-24 HRS) Nasopharyngeal Nasopharyngeal Swab     Status: None   Collection Time: 12/25/18  9:32 AM   Specimen: Nasopharyngeal Swab   Result Value Ref Range Status   SARS Coronavirus 2 NEGATIVE NEGATIVE Final    Comment: (NOTE) SARS-CoV-2 target nucleic acids are NOT DETECTED. The SARS-CoV-2 RNA is generally detectable in upper and lower respiratory specimens during the acute phase of infection. Negative results do not preclude SARS-CoV-2 infection, do not rule out co-infections with other pathogens, and should not be used as the sole basis for treatment or other patient management decisions. Negative results must be combined with clinical observations, patient history, and epidemiological information. The expected result is Negative. Fact Sheet for Patients: SugarRoll.be Fact Sheet for Healthcare Providers: https://www.woods-mathews.com/ This test is not yet approved or cleared by the [place] FDA and  has been authorized for detection and/or diagnosis of SARS-CoV-2 by FDA under an Emergency Use Authorization (EUA). This EUA will remain  in effect (meaning this test can be used) for the duration of the COVID-19 declaration under Section 56 4(b)(1) of the Act, 21 U.S.C. section 360bbb-3(b)(1), unless the authorization is terminated or revoked sooner. Performed at [facility], [place] [address] [place], [place] [address]      Labs: BNP (last 3 results) No results for input(s): BNP in the last 8760 hours. Basic Metabolic Panel: Recent Labs  Lab 12/24/18 0422  NA 142  K 4.2  CL 108  CO2 25  GLUCOSE 88  BUN 21  CREATININE 0.81  CALCIUM 8.7*   Liver Function Tests: No results for input(s): AST, ALT, ALKPHOS, BILITOT, PROT, ALBUMIN in the last 168 hours. No results for input(s): LIPASE, AMYLASE in the last 168 hours. No results for input(s): AMMONIA in the last 168 hours. CBC: Recent Labs  Lab 12/20/18 0206  WBC 9.0  HGB 10.1*  HCT 31.2*  MCV 100.3*  PLT 197   Cardiac Enzymes: No results for input(s): CKTOTAL, CKMB, CKMBINDEX, TROPONINI in the  last 168 hours. BNP: Invalid input(s): POCBNP CBG: No results for input(s): GLUCAP in the last 168 hours. D-Dimer No results for input(s): DDIMER in the last 72 hours. Hgb A1c No results for input(s): HGBA1C in the last 72 hours. Lipid Profile No results for input(s): CHOL, HDL, LDLCALC, TRIG, CHOLHDL, LDLDIRECT in the last 72 hours. Thyroid function studies No results for input(s): TSH, T4TOTAL, T3FREE, THYROIDAB in the last 72 hours.  Invalid input(s): FREET3 Anemia work  up No results for input(s): VITAMINB12, FOLATE, FERRITIN, TIBC, IRON, RETICCTPCT in the last 72 hours. Urinalysis    Component Value Date/Time   COLORURINE YELLOW 04/19/2017 1737   APPEARANCEUR CLEAR 04/19/2017 1737   LABSPEC 1.025 04/19/2017 1737   PHURINE 5.0 04/19/2017 1737   GLUCOSEU NEGATIVE 04/19/2017 1737   HGBUR NEGATIVE 04/19/2017 1737   BILIRUBINUR negative 04/01/2018 1148   KETONESUR negative 03/28/2018 1116   KETONESUR 20 (A) 04/19/2017 1737   PROTEINUR Positive (A) 04/01/2018 1148   PROTEINUR NEGATIVE 04/19/2017 1737   UROBILINOGEN 0.2 04/01/2018 1148   UROBILINOGEN 1.0 04/27/2012 0019   NITRITE negative 04/01/2018 1148   NITRITE NEGATIVE 04/19/2017 1737   LEUKOCYTESUR Trace (A) 04/01/2018 1148   Sepsis Labs Invalid input(s): PROCALCITONIN,  WBC,  LACTICIDVEN Microbiology Recent Results (from the past 240 hour(s))  SARS CORONAVIRUS 2 (TAT 6-24 HRS) Nasopharyngeal Nasopharyngeal Swab     Status: None   Collection Time: 12/21/18  5:26 PM   Specimen: Nasopharyngeal Swab  Result Value Ref Range Status   SARS Coronavirus 2 NEGATIVE NEGATIVE Final    Comment: (NOTE) SARS-CoV-2 target nucleic acids are NOT DETECTED. The SARS-CoV-2 RNA is generally detectable in upper and lower respiratory specimens during the acute phase of infection. Negative results do not preclude SARS-CoV-2 infection, do not rule out co-infections with other pathogens, and should not be used as the sole basis for  treatment or other patient management decisions. Negative results must be combined with clinical observations, patient history, and epidemiological information. The expected result is Negative. Fact Sheet for Patients: SugarRoll.be Fact Sheet for Healthcare Providers: https://www.woods-mathews.com/ This test is not yet approved or cleared by the [place] FDA and  has been authorized for detection and/or diagnosis of SARS-CoV-2 by FDA under an Emergency Use Authorization (EUA). This EUA will remain  in effect (meaning this test can be used) for the duration of the COVID-19 declaration under Section 56 4(b)(1) of the Act, 21 U.S.C. section 360bbb-3(b)(1), unless the authorization is terminated or revoked sooner. Performed at [facility], [place] [address] [place], [place] [address]   SARS CORONAVIRUS 2 (TAT 6-24 HRS) Nasopharyngeal Nasopharyngeal Swab     Status: None   Collection Time: 12/25/18  9:32 AM   Specimen: Nasopharyngeal Swab  Result Value Ref Range Status   SARS Coronavirus 2 NEGATIVE NEGATIVE Final    Comment: (NOTE) SARS-CoV-2 target nucleic acids are NOT DETECTED. The SARS-CoV-2 RNA is generally detectable in upper and lower respiratory specimens during the acute phase of infection. Negative results do not preclude SARS-CoV-2 infection, do not rule out co-infections with other pathogens, and should not be used as the sole basis for treatment or other patient management decisions. Negative results must be combined with clinical observations, patient history, and epidemiological information. The expected result is Negative. Fact Sheet for Patients: SugarRoll.be Fact Sheet for Healthcare Providers: https://www.woods-mathews.com/ This test is not yet approved or cleared by the [place] FDA and  has been authorized for detection and/or diagnosis of SARS-CoV-2 by FDA under an  Emergency Use Authorization (EUA). This EUA will remain  in effect (meaning this test can be used) for the duration of the COVID-19 declaration under Section 56 4(b)(1) of the Act, 21 U.S.C. section 360bbb-3(b)(1), unless the authorization is terminated or revoked sooner. Performed at [facility], [place] [address], [place], [place] [address]      Time coordinating discharge in minutes: 65  SIGNED:   Debbe Odea, MD  [facility] 12/26/2018,  8:14 AM Pager   If 7PM-7AM, please contact night-coverage [email or web address] Password [email or web address]

## 2018-12-26 NOTE — Plan of Care (Signed)
  Problem: Education: Goal: Knowledge of General Education information will improve Description: Including pain rating scale, medication(s)/side effects and non-pharmacologic comfort measures Outcome: Completed/Met   Problem: Health Behavior/Discharge Planning: Goal: Ability to manage health-related needs will improve Outcome: Completed/Met   Problem: Clinical Measurements: Goal: Ability to maintain clinical measurements within normal limits will improve Outcome: Completed/Met Goal: Will remain free from infection Outcome: Completed/Met Goal: Diagnostic test results will improve Outcome: Completed/Met Goal: Respiratory complications will improve Outcome: Completed/Met Goal: Cardiovascular complication will be avoided 12/26/2018 1302 by Stevan Born, RN Outcome: Completed/Met 12/26/2018 1104 by Stevan Born, RN Outcome: Progressing   Problem: Activity: Goal: Risk for activity intolerance will decrease 12/26/2018 1302 by Stevan Born, RN Outcome: Completed/Met 12/26/2018 1104 by Stevan Born, RN Outcome: Progressing   Problem: Nutrition: Goal: Adequate nutrition will be maintained 12/26/2018 1302 by Stevan Born, RN Outcome: Completed/Met 12/26/2018 1104 by Stevan Born, RN Outcome: Progressing   Problem: Coping: Goal: Level of anxiety will decrease Outcome: Completed/Met   Problem: Elimination: Goal: Will not experience complications related to bowel motility 12/26/2018 1302 by Stevan Born, RN Outcome: Completed/Met 12/26/2018 1104 by Stevan Born, RN Outcome: Progressing Goal: Will not experience complications related to urinary retention Outcome: Completed/Met   Problem: Pain Managment: Goal: General experience of comfort will improve Outcome: Completed/Met   Problem: Safety: Goal: Ability to remain free from injury will improve 12/26/2018 1302 by Stevan Born, RN Outcome: Completed/Met 12/26/2018 1104 by Stevan Born,  RN Outcome: Progressing   Problem: Skin Integrity: Goal: Risk for impaired skin integrity will decrease Outcome: Completed/Met   Problem: Education: Goal: Knowledge of General Education information will improve Description: Including pain rating scale, medication(s)/side effects and non-pharmacologic comfort measures 12/26/2018 1302 by Stevan Born, RN Outcome: Completed/Met 12/26/2018 1104 by Stevan Born, RN Outcome: Progressing   Problem: Health Behavior/Discharge Planning: Goal: Ability to manage health-related needs will improve Outcome: Completed/Met   Problem: Clinical Measurements: Goal: Ability to maintain clinical measurements within normal limits will improve Outcome: Completed/Met Goal: Will remain free from infection Outcome: Completed/Met Goal: Diagnostic test results will improve Outcome: Completed/Met Goal: Respiratory complications will improve Outcome: Completed/Met Goal: Cardiovascular complication will be avoided Outcome: Completed/Met   Problem: Activity: Goal: Risk for activity intolerance will decrease Outcome: Completed/Met   Problem: Nutrition: Goal: Adequate nutrition will be maintained Outcome: Completed/Met   Problem: Coping: Goal: Level of anxiety will decrease Outcome: Completed/Met   Problem: Elimination: Goal: Will not experience complications related to bowel motility Outcome: Completed/Met Goal: Will not experience complications related to urinary retention Outcome: Completed/Met   Problem: Pain Managment: Goal: General experience of comfort will improve Outcome: Completed/Met   Problem: Safety: Goal: Ability to remain free from injury will improve Outcome: Completed/Met   Problem: Skin Integrity: Goal: Risk for impaired skin integrity will decrease Outcome: Completed/Met

## 2018-12-27 DIAGNOSIS — I482 Chronic atrial fibrillation, unspecified: Secondary | ICD-10-CM | POA: Diagnosis not present

## 2018-12-27 DIAGNOSIS — H42 Glaucoma in diseases classified elsewhere: Secondary | ICD-10-CM | POA: Diagnosis not present

## 2018-12-27 DIAGNOSIS — I10 Essential (primary) hypertension: Secondary | ICD-10-CM | POA: Diagnosis not present

## 2018-12-27 DIAGNOSIS — N401 Enlarged prostate with lower urinary tract symptoms: Secondary | ICD-10-CM | POA: Diagnosis not present

## 2018-12-27 DIAGNOSIS — K5900 Constipation, unspecified: Secondary | ICD-10-CM | POA: Diagnosis not present

## 2018-12-27 DIAGNOSIS — G609 Hereditary and idiopathic neuropathy, unspecified: Secondary | ICD-10-CM | POA: Diagnosis not present

## 2018-12-27 DIAGNOSIS — I4891 Unspecified atrial fibrillation: Secondary | ICD-10-CM | POA: Diagnosis not present

## 2018-12-27 DIAGNOSIS — N19 Unspecified kidney failure: Secondary | ICD-10-CM | POA: Diagnosis not present

## 2018-12-27 DIAGNOSIS — M9702XA Periprosthetic fracture around internal prosthetic left hip joint, initial encounter: Secondary | ICD-10-CM | POA: Diagnosis not present

## 2018-12-27 NOTE — Consult Note (Signed)
   [facility] CM Inpatient Consult   12/27/2018  BREKKEN BEACH 09-14-1926 [id number]    Update Note:   Noted disposition change for this [facility] Medicare patient.   Review of transition of care CM note shows that patient transitioned to skilled  nursing facility: [facility] SNF for rehab.  Patient was denied CIR from [facility];  PT/OT continue to recommend SNF vs Home with home health/DME and 24hr assist.  Patient lives at home with his spouse and currently requires moderate to max assist. Both patient and spouse have considered ST- SNF.  No [facility] Care Management needs identified at this point for post hospital follow-up as patient's care will be met at the skilled level of care (SNF).   For questions and additional information, please contact:  Atari Novick A. Nazli Penn, BSN, RN-BC [facility] Liaison Cell: [phone]

## 2018-12-28 DIAGNOSIS — M80052D Age-related osteoporosis with current pathological fracture, left femur, subsequent encounter for fracture with routine healing: Secondary | ICD-10-CM | POA: Diagnosis not present

## 2018-12-28 DIAGNOSIS — M6281 Muscle weakness (generalized): Secondary | ICD-10-CM | POA: Diagnosis not present

## 2018-12-28 DIAGNOSIS — D649 Anemia, unspecified: Secondary | ICD-10-CM | POA: Diagnosis not present

## 2018-12-28 DIAGNOSIS — R001 Bradycardia, unspecified: Secondary | ICD-10-CM | POA: Diagnosis not present

## 2018-12-28 DIAGNOSIS — I482 Chronic atrial fibrillation, unspecified: Secondary | ICD-10-CM | POA: Diagnosis not present

## 2018-12-28 DIAGNOSIS — G20 Parkinson's disease: Secondary | ICD-10-CM | POA: Diagnosis not present

## 2018-12-28 DIAGNOSIS — G600 Hereditary motor and sensory neuropathy: Secondary | ICD-10-CM | POA: Diagnosis not present

## 2018-12-28 DIAGNOSIS — N400 Enlarged prostate without lower urinary tract symptoms: Secondary | ICD-10-CM | POA: Diagnosis not present

## 2018-12-28 DIAGNOSIS — K5900 Constipation, unspecified: Secondary | ICD-10-CM | POA: Diagnosis not present

## 2018-12-29 DIAGNOSIS — H42 Glaucoma in diseases classified elsewhere: Secondary | ICD-10-CM | POA: Diagnosis not present

## 2018-12-29 DIAGNOSIS — M6281 Muscle weakness (generalized): Secondary | ICD-10-CM | POA: Diagnosis not present

## 2018-12-29 DIAGNOSIS — G609 Hereditary and idiopathic neuropathy, unspecified: Secondary | ICD-10-CM | POA: Diagnosis not present

## 2018-12-29 DIAGNOSIS — I10 Essential (primary) hypertension: Secondary | ICD-10-CM | POA: Diagnosis not present

## 2018-12-29 DIAGNOSIS — N401 Enlarged prostate with lower urinary tract symptoms: Secondary | ICD-10-CM | POA: Diagnosis not present

## 2018-12-29 DIAGNOSIS — E559 Vitamin D deficiency, unspecified: Secondary | ICD-10-CM | POA: Diagnosis not present

## 2018-12-29 DIAGNOSIS — K5900 Constipation, unspecified: Secondary | ICD-10-CM | POA: Diagnosis not present

## 2018-12-29 DIAGNOSIS — I482 Chronic atrial fibrillation, unspecified: Secondary | ICD-10-CM | POA: Diagnosis not present

## 2018-12-29 DIAGNOSIS — N19 Unspecified kidney failure: Secondary | ICD-10-CM | POA: Diagnosis not present

## 2019-01-01 DIAGNOSIS — N19 Unspecified kidney failure: Secondary | ICD-10-CM | POA: Diagnosis not present

## 2019-01-01 DIAGNOSIS — K5900 Constipation, unspecified: Secondary | ICD-10-CM | POA: Diagnosis not present

## 2019-01-01 DIAGNOSIS — G609 Hereditary and idiopathic neuropathy, unspecified: Secondary | ICD-10-CM | POA: Diagnosis not present

## 2019-01-01 DIAGNOSIS — H42 Glaucoma in diseases classified elsewhere: Secondary | ICD-10-CM | POA: Diagnosis not present

## 2019-01-01 DIAGNOSIS — I10 Essential (primary) hypertension: Secondary | ICD-10-CM | POA: Diagnosis not present

## 2019-01-01 DIAGNOSIS — M9702XA Periprosthetic fracture around internal prosthetic left hip joint, initial encounter: Secondary | ICD-10-CM | POA: Diagnosis not present

## 2019-01-01 DIAGNOSIS — N401 Enlarged prostate with lower urinary tract symptoms: Secondary | ICD-10-CM | POA: Diagnosis not present

## 2019-01-01 DIAGNOSIS — D500 Iron deficiency anemia secondary to blood loss (chronic): Secondary | ICD-10-CM | POA: Diagnosis not present

## 2019-01-01 DIAGNOSIS — I482 Chronic atrial fibrillation, unspecified: Secondary | ICD-10-CM | POA: Diagnosis not present

## 2019-01-10 ENCOUNTER — Ambulatory Visit: Payer: Medicare HMO | Admitting: Podiatry

## 2019-01-10 DIAGNOSIS — G609 Hereditary and idiopathic neuropathy, unspecified: Secondary | ICD-10-CM | POA: Diagnosis not present

## 2019-01-10 DIAGNOSIS — I482 Chronic atrial fibrillation, unspecified: Secondary | ICD-10-CM | POA: Diagnosis not present

## 2019-01-10 DIAGNOSIS — M6281 Muscle weakness (generalized): Secondary | ICD-10-CM | POA: Diagnosis not present

## 2019-01-10 DIAGNOSIS — D500 Iron deficiency anemia secondary to blood loss (chronic): Secondary | ICD-10-CM | POA: Diagnosis not present

## 2019-01-10 DIAGNOSIS — N19 Unspecified kidney failure: Secondary | ICD-10-CM | POA: Diagnosis not present

## 2019-01-10 DIAGNOSIS — N401 Enlarged prostate with lower urinary tract symptoms: Secondary | ICD-10-CM | POA: Diagnosis not present

## 2019-01-10 DIAGNOSIS — K5900 Constipation, unspecified: Secondary | ICD-10-CM | POA: Diagnosis not present

## 2019-01-10 DIAGNOSIS — I10 Essential (primary) hypertension: Secondary | ICD-10-CM | POA: Diagnosis not present

## 2019-01-10 DIAGNOSIS — M9702XA Periprosthetic fracture around internal prosthetic left hip joint, initial encounter: Secondary | ICD-10-CM | POA: Diagnosis not present

## 2019-01-12 ENCOUNTER — Other Ambulatory Visit: Payer: Self-pay | Admitting: Cardiology

## 2019-01-15 DIAGNOSIS — S72002D Fracture of unspecified part of neck of left femur, subsequent encounter for closed fracture with routine healing: Secondary | ICD-10-CM | POA: Diagnosis not present

## 2019-01-24 DIAGNOSIS — E559 Vitamin D deficiency, unspecified: Secondary | ICD-10-CM | POA: Diagnosis not present

## 2019-01-24 DIAGNOSIS — G20 Parkinson's disease: Secondary | ICD-10-CM | POA: Diagnosis not present

## 2019-01-24 DIAGNOSIS — I4891 Unspecified atrial fibrillation: Secondary | ICD-10-CM | POA: Diagnosis not present

## 2019-01-24 DIAGNOSIS — E039 Hypothyroidism, unspecified: Secondary | ICD-10-CM | POA: Diagnosis not present

## 2019-01-24 DIAGNOSIS — I10 Essential (primary) hypertension: Secondary | ICD-10-CM | POA: Diagnosis not present

## 2019-01-24 DIAGNOSIS — S72002D Fracture of unspecified part of neck of left femur, subsequent encounter for closed fracture with routine healing: Secondary | ICD-10-CM | POA: Diagnosis not present

## 2019-01-24 DIAGNOSIS — M4800 Spinal stenosis, site unspecified: Secondary | ICD-10-CM | POA: Diagnosis not present

## 2019-01-24 DIAGNOSIS — F0280 Dementia in other diseases classified elsewhere without behavioral disturbance: Secondary | ICD-10-CM | POA: Diagnosis not present

## 2019-01-24 DIAGNOSIS — I2510 Atherosclerotic heart disease of native coronary artery without angina pectoris: Secondary | ICD-10-CM | POA: Diagnosis not present

## 2019-01-29 ENCOUNTER — Other Ambulatory Visit: Payer: Self-pay

## 2019-01-29 ENCOUNTER — Telehealth (INDEPENDENT_AMBULATORY_CARE_PROVIDER_SITE_OTHER): Payer: Medicare HMO | Admitting: Family Medicine

## 2019-01-29 DIAGNOSIS — R197 Diarrhea, unspecified: Secondary | ICD-10-CM | POA: Diagnosis not present

## 2019-01-29 DIAGNOSIS — K625 Hemorrhage of anus and rectum: Secondary | ICD-10-CM | POA: Diagnosis not present

## 2019-01-29 NOTE — Progress Notes (Signed)
Virtual Visit via Telephone Note  I connected with [name] on 01/29/19 at 6:19 PM by telephone and verified that I am speaking with the correct person using two identifiers.   I discussed the limitations, risks, security and privacy concerns of performing an evaluation and management service by telephone and the availability of in person appointments. I also discussed with the patient that there may be a patient responsible charge related to this service. The patient expressed understanding and agreed to proceed, Wife on phone as well for further information. consent obtained.   Chief complaint:  Diarrhea, rectal bleeding.   History of Present Illness: Chad Russell is a 85 y.o. male  S/p hospitalization in October into November for femur fx.  In [place] until past week or two. Had some constipation prior to leaving. Treated with miralax - 5 doses prior to leaving. Diarrhea when he came home for about 5 days.treated with immodium - better, normal stool today.  Blood in stool on 4th or 5th day of diarrhea. Noticed with wiping - bright red blood. Small amt of blood with wiping. Anal irritation. Blood has resolved last last week - none on past few days.  Feeling well otherwise. No abdominal pain.  PT form [facility]. recommended he discuss with me.  No narcotics or pain meds now. Tylenol only now.      Patient Active Problem List   Diagnosis Date Noted   AKI (acute kidney injury) ([place]) 12/15/2018   Hematoma of scalp    Heart block    Chest pain 05/04/2018   Fracture of femoral neck, left ([place]) 04/19/2017   MGUS (monoclonal gammopathy of unknown significance) 01/15/2017   BPH with obstruction/lower urinary tract symptoms 09/07/2016   Preoperative cardiovascular examination 08/24/2016   Urinary retention 05/13/2016   Balance problem 10/31/2013   OSA (obstructive sleep apnea) 10/31/2013   BPH (benign prostatic hyperplasia) 08/28/2012   Atrial  fibrillation ([place]) 06/08/2012   Hyperplasia of prostate with lower urinary tract symptoms (LUTS) 04/28/2012   Moderate malnutrition ([place]) 04/28/2012   Infection of urinary tract - recurrent 04/26/2012   NSTEMI (non-ST elevated myocardial infarction) ([place]) 03/15/2011   Complete heart block ([place]) 03/15/2011   HTN (hypertension) 03/15/2011   Past Medical History:  Diagnosis Date   Arthritis    Atrial fibrillation ([place])    Balance problem 10/31/2013   BPH (benign prostatic hyperplasia) 2014   Urology Dr [name] with [facility] [phone]    Bradycardia    a. baseline HR in the 30's. Asymptomatic - no history of PPM placement.    CAD (coronary artery disease)    LHC 03/16/11: Mid LAD 10-20%, proximal circumflex 10%, mid RCA 20%, EF 55%.   Cataract    Complete heart block (HCC)    Fracture of femoral neck, right (HCC) 04/19/2017   GERD (gastroesophageal reflux disease)    takes occasional  zantac   Hx of echocardiogram    Echo 02/2011: EF 65-70%, normal wall motion, MAC, mild MR, mild LAE, mild RVE   Hypertension    OSA (obstructive sleep apnea)    per patient went to have sleep study done about 2 years ago, bu they never F/U with him about results; but wife reports he still has periods of apnea at night    Talar fracture    casted no surg   Past Surgical History:  Procedure Laterality Date   ABDOMINAL AORTOGRAM W/LOWER EXTREMITY N/A 01/17/2017   Procedure: ABDOMINAL AORTOGRAM W/LOWER EXTREMITY;  Surgeon: Angelia Mould, MD;  Location: [facility];  Service: Cardiovascular;  Laterality: N/A;   COLONOSCOPY     Repeated and normal in 2011   FRACTURE SURGERY     HEMIARTHROPLASTY HIP Right 04/19/2017   HEMORROIDECTOMY     HIP ARTHROPLASTY Right 04/19/2017   Procedure: ARTHROPLASTY BIPOLAR HIP (HEMIARTHROPLASTY);  Surgeon: Marchia Bond, MD;  Location: [facility];  Service: Orthopedics;  Laterality: Right;   HIP ARTHROPLASTY Left 12/16/2018   Procedure:  ARTHROPLASTY BIPOLAR HIP (HEMIARTHROPLASTY);  Surgeon: Altamese [place], MD;  Location: [facility];  Service: Orthopedics;  Laterality: Left;   INGUINAL HERNIA REPAIR Right 02/26/2014   Procedure: LAPAROSCOPIC RIGHT INGUINAL HERNIA REPAIR;  Surgeon: Ralene Ok, MD;  Location: [facility];  Service: General;  Laterality: Right;   INGUINAL HERNIA REPAIR     INSERTION OF MESH Right 02/26/2014   Procedure: INSERTION OF MESH;  Surgeon: Ralene Ok, MD;  Location: [facility];  Service: General;  Laterality: Right;   LEFT HEART CATHETERIZATION WITH CORONARY ANGIOGRAM N/A 03/16/2011   Procedure: LEFT HEART CATHETERIZATION WITH CORONARY ANGIOGRAM;  Surgeon: Peter M Martinique, MD;  Location: [facility];  Service: Cardiovascular;  Laterality: N/A;   PROSTATE BIOPSY     negative - [facility]   TRANSURETHRAL RESECTION OF PROSTATE N/A 09/07/2016   Procedure: TRANSURETHRAL RESECTION OF THE PROSTATE (TURP);  Surgeon: Irine Seal, MD;  Location: [facility];  Service: Urology;  Laterality: N/A;   Allergies  Allergen Reactions   Doxycycline Other (See Comments)    Upset stomach   Keflex [Cephalexin] Other (See Comments)    Abdominal discomfort 04-19-17 Pt has tolerated orally   Prior to Admission medications   Medication Sig Start Date End Date Taking? Authorizing Provider  acetaminophen (TYLENOL) 500 MG tablet Take 1 tablet (500 mg total) by mouth every 8 (eight) hours. 12/26/18  Yes Debbe Odea, MD  amLODipine (NORVASC) 10 MG tablet Take 1 tablet (10 mg total) by mouth daily. 12/26/18  Yes Debbe Odea, MD  bisacodyl (DULCOLAX) 5 MG EC tablet Take 1 tablet (5 mg total) by mouth daily as needed for moderate constipation. 12/26/18  Yes Debbe Odea, MD  carbidopa-levodopa (SINEMET) 25-100 MG tablet Take 1 tablet by mouth 3 (three) times daily. 06/20/18  Yes Patel, Donika K, DO  feeding supplement, ENSURE ENLIVE, (ENSURE ENLIVE) LIQD Take 237 mLs by mouth 2 (two) times daily between meals. 12/26/18  Yes Debbe Odea,  MD  lisinopril (ZESTRIL) 20 MG tablet Take 1 tablet (20 mg total) by mouth daily. 12/26/18  Yes Debbe Odea, MD  lisinopril-hydrochlorothiazide (ZESTORETIC) 10-12.5 MG tablet TAKE ONE TABLET BY MOUTH DAILY 01/15/19  Yes Minus Breeding, MD  Misc Natural Products (PROSTATE) CAPS Take 1 tablet daily by mouth. Prostate Plus otc supplement   Yes [provider]  Multiple Vitamin (MULTIVITAMIN WITH MINERALS) TABS tablet Take 1 tablet daily by mouth.   Yes [provider]  Multiple Vitamins-Minerals (PRESERVISION AREDS 2+MULTI VIT PO) Take 1 capsule by mouth 2 (two) times daily.   Yes [provider]  rivaroxaban (XARELTO) 20 MG TABS tablet Take 1 tablet (20 mg total) by mouth daily with supper. 08/14/18  Yes Minus Breeding, MD  senna (SENOKOT) 8.6 MG TABS tablet Take 1 tablet (8.6 mg total) by mouth at bedtime as needed for mild constipation. 12/26/18  Yes Debbe Odea, MD  timolol (TIMOPTIC) 0.5 % ophthalmic solution One drop in both eyes in the morning and one drop in the left eye at night. Patient  taking differently: Place 1 drop into both eyes 2 (two) times daily.  04/22/17  Yes Alphonzo Grieve, MD  vitamin C (ASCORBIC ACID) 500 MG tablet Take 500 mg by mouth daily.   Yes [provider]   Social History   Socioeconomic History   Marital status: Married    Spouse name: Mardene Celeste   Number of children: 0   Years of education: 18   Highest education level: Not on file  Occupational History   Occupation: Scientist, product/process development   Occupation: civil Advertising copywriter strain: Not on file   Food insecurity    Worry: Not on file    Inability: Not on file   Transportation needs    Medical: Not on file    Non-medical: Not on file  Tobacco Use   Smoking status: Former Smoker    Packs/day: 1.00    Years: 3.00    Pack years: 3.00    Types: Cigarettes    Quit date: 03/17/1955    Years since quitting: 63.9   Smokeless tobacco: Former  Network engineer and Sexual Activity   Alcohol use: Yes    Alcohol/week: 14.0 standard drinks    Types: 7 Cans of beer, 7 Standard drinks or equivalent per week    Comment: daily   Drug use: No   Sexual activity: Not Currently    Birth control/protection: None  Lifestyle   Physical activity    Days per week: Not on file    Minutes per session: Not on file   Stress: Not on file  Relationships   Social connections    Talks on phone: Not on file    Gets together: Not on file    Attends religious service: Not on file    Active member of club or organization: Not on file    Attends meetings of clubs or organizations: Not on file    Relationship status: Not on file   Intimate partner violence    Fear of current or ex partner: Not on file    Emotionally abused: Not on file    Physically abused: Not on file    Forced sexual activity: Not on file  Other Topics Concern   Not on file  Social History Narrative   Civil engineer, 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  Has no children.     Right handed      Observations/Objective:   Assessment and Plan: No diagnosis found.   Follow Up Instructions:    I discussed the assessment and treatment plan with the patient. The patient was provided an opportunity to ask questions and all were answered. The patient agreed with the plan and demonstrated an understanding of the instructions.   The patient was advised to call back or seek an in-person evaluation if the symptoms worsen or if the condition fails to improve as anticipated.  I provided 13 minutes of non-face-to-face time during this encounter.  Signed,   [name], MD [facility].  01/29/19

## 2019-01-29 NOTE — Progress Notes (Signed)
CC- diarrhea/ bleeding rectal on 01/20/19- Patient is doing fine now but kept appt. This all Happened post taking Miralax. Patient had a normal bowl movent today. (spoke with patient's wife)

## 2019-01-29 NOTE — Patient Instructions (Addendum)
Blood could have been from hemorrhoids, or tear around anal area.  Try to keep bowels soft with colace, but if causes diarrhea stop that medicine.  If any return of bleeding - follow up or go to emergency room if acutely worse.

## 2019-01-31 DIAGNOSIS — I2510 Atherosclerotic heart disease of native coronary artery without angina pectoris: Secondary | ICD-10-CM | POA: Diagnosis not present

## 2019-01-31 DIAGNOSIS — M4800 Spinal stenosis, site unspecified: Secondary | ICD-10-CM | POA: Diagnosis not present

## 2019-01-31 DIAGNOSIS — I10 Essential (primary) hypertension: Secondary | ICD-10-CM | POA: Diagnosis not present

## 2019-01-31 DIAGNOSIS — G20 Parkinson's disease: Secondary | ICD-10-CM | POA: Diagnosis not present

## 2019-01-31 DIAGNOSIS — S72002D Fracture of unspecified part of neck of left femur, subsequent encounter for closed fracture with routine healing: Secondary | ICD-10-CM | POA: Diagnosis not present

## 2019-01-31 DIAGNOSIS — F0280 Dementia in other diseases classified elsewhere without behavioral disturbance: Secondary | ICD-10-CM | POA: Diagnosis not present

## 2019-01-31 DIAGNOSIS — E559 Vitamin D deficiency, unspecified: Secondary | ICD-10-CM | POA: Diagnosis not present

## 2019-01-31 DIAGNOSIS — E039 Hypothyroidism, unspecified: Secondary | ICD-10-CM | POA: Diagnosis not present

## 2019-01-31 DIAGNOSIS — I4891 Unspecified atrial fibrillation: Secondary | ICD-10-CM | POA: Diagnosis not present

## 2019-02-02 DIAGNOSIS — S72002D Fracture of unspecified part of neck of left femur, subsequent encounter for closed fracture with routine healing: Secondary | ICD-10-CM | POA: Diagnosis not present

## 2019-02-02 DIAGNOSIS — M4800 Spinal stenosis, site unspecified: Secondary | ICD-10-CM | POA: Diagnosis not present

## 2019-02-02 DIAGNOSIS — G20 Parkinson's disease: Secondary | ICD-10-CM | POA: Diagnosis not present

## 2019-02-02 DIAGNOSIS — E039 Hypothyroidism, unspecified: Secondary | ICD-10-CM | POA: Diagnosis not present

## 2019-02-02 DIAGNOSIS — F0280 Dementia in other diseases classified elsewhere without behavioral disturbance: Secondary | ICD-10-CM | POA: Diagnosis not present

## 2019-02-02 DIAGNOSIS — I2510 Atherosclerotic heart disease of native coronary artery without angina pectoris: Secondary | ICD-10-CM | POA: Diagnosis not present

## 2019-02-02 DIAGNOSIS — I4891 Unspecified atrial fibrillation: Secondary | ICD-10-CM | POA: Diagnosis not present

## 2019-02-02 DIAGNOSIS — I10 Essential (primary) hypertension: Secondary | ICD-10-CM | POA: Diagnosis not present

## 2019-02-02 DIAGNOSIS — E559 Vitamin D deficiency, unspecified: Secondary | ICD-10-CM | POA: Diagnosis not present

## 2019-02-05 DIAGNOSIS — M4800 Spinal stenosis, site unspecified: Secondary | ICD-10-CM | POA: Diagnosis not present

## 2019-02-05 DIAGNOSIS — I10 Essential (primary) hypertension: Secondary | ICD-10-CM | POA: Diagnosis not present

## 2019-02-05 DIAGNOSIS — E039 Hypothyroidism, unspecified: Secondary | ICD-10-CM | POA: Diagnosis not present

## 2019-02-05 DIAGNOSIS — E559 Vitamin D deficiency, unspecified: Secondary | ICD-10-CM | POA: Diagnosis not present

## 2019-02-05 DIAGNOSIS — I2510 Atherosclerotic heart disease of native coronary artery without angina pectoris: Secondary | ICD-10-CM | POA: Diagnosis not present

## 2019-02-05 DIAGNOSIS — S72002D Fracture of unspecified part of neck of left femur, subsequent encounter for closed fracture with routine healing: Secondary | ICD-10-CM | POA: Diagnosis not present

## 2019-02-05 DIAGNOSIS — I4891 Unspecified atrial fibrillation: Secondary | ICD-10-CM | POA: Diagnosis not present

## 2019-02-05 DIAGNOSIS — F0280 Dementia in other diseases classified elsewhere without behavioral disturbance: Secondary | ICD-10-CM | POA: Diagnosis not present

## 2019-02-05 DIAGNOSIS — G20 Parkinson's disease: Secondary | ICD-10-CM | POA: Diagnosis not present

## 2019-02-07 DIAGNOSIS — M4800 Spinal stenosis, site unspecified: Secondary | ICD-10-CM | POA: Diagnosis not present

## 2019-02-07 DIAGNOSIS — E559 Vitamin D deficiency, unspecified: Secondary | ICD-10-CM | POA: Diagnosis not present

## 2019-02-07 DIAGNOSIS — F0280 Dementia in other diseases classified elsewhere without behavioral disturbance: Secondary | ICD-10-CM | POA: Diagnosis not present

## 2019-02-07 DIAGNOSIS — I4891 Unspecified atrial fibrillation: Secondary | ICD-10-CM | POA: Diagnosis not present

## 2019-02-07 DIAGNOSIS — I10 Essential (primary) hypertension: Secondary | ICD-10-CM | POA: Diagnosis not present

## 2019-02-07 DIAGNOSIS — G20 Parkinson's disease: Secondary | ICD-10-CM | POA: Diagnosis not present

## 2019-02-07 DIAGNOSIS — I2510 Atherosclerotic heart disease of native coronary artery without angina pectoris: Secondary | ICD-10-CM | POA: Diagnosis not present

## 2019-02-07 DIAGNOSIS — E039 Hypothyroidism, unspecified: Secondary | ICD-10-CM | POA: Diagnosis not present

## 2019-02-07 DIAGNOSIS — S72002D Fracture of unspecified part of neck of left femur, subsequent encounter for closed fracture with routine healing: Secondary | ICD-10-CM | POA: Diagnosis not present

## 2019-02-12 DIAGNOSIS — I2510 Atherosclerotic heart disease of native coronary artery without angina pectoris: Secondary | ICD-10-CM | POA: Diagnosis not present

## 2019-02-12 DIAGNOSIS — I10 Essential (primary) hypertension: Secondary | ICD-10-CM | POA: Diagnosis not present

## 2019-02-12 DIAGNOSIS — E039 Hypothyroidism, unspecified: Secondary | ICD-10-CM | POA: Diagnosis not present

## 2019-02-12 DIAGNOSIS — I4891 Unspecified atrial fibrillation: Secondary | ICD-10-CM | POA: Diagnosis not present

## 2019-02-12 DIAGNOSIS — M4800 Spinal stenosis, site unspecified: Secondary | ICD-10-CM | POA: Diagnosis not present

## 2019-02-12 DIAGNOSIS — G20 Parkinson's disease: Secondary | ICD-10-CM | POA: Diagnosis not present

## 2019-02-12 DIAGNOSIS — E559 Vitamin D deficiency, unspecified: Secondary | ICD-10-CM | POA: Diagnosis not present

## 2019-02-12 DIAGNOSIS — S72002D Fracture of unspecified part of neck of left femur, subsequent encounter for closed fracture with routine healing: Secondary | ICD-10-CM | POA: Diagnosis not present

## 2019-02-12 DIAGNOSIS — F0280 Dementia in other diseases classified elsewhere without behavioral disturbance: Secondary | ICD-10-CM | POA: Diagnosis not present

## 2019-02-14 DIAGNOSIS — G20 Parkinson's disease: Secondary | ICD-10-CM | POA: Diagnosis not present

## 2019-02-14 DIAGNOSIS — S72002D Fracture of unspecified part of neck of left femur, subsequent encounter for closed fracture with routine healing: Secondary | ICD-10-CM | POA: Diagnosis not present

## 2019-02-14 DIAGNOSIS — I2510 Atherosclerotic heart disease of native coronary artery without angina pectoris: Secondary | ICD-10-CM | POA: Diagnosis not present

## 2019-02-14 DIAGNOSIS — E039 Hypothyroidism, unspecified: Secondary | ICD-10-CM | POA: Diagnosis not present

## 2019-02-14 DIAGNOSIS — M4800 Spinal stenosis, site unspecified: Secondary | ICD-10-CM | POA: Diagnosis not present

## 2019-02-14 DIAGNOSIS — I4891 Unspecified atrial fibrillation: Secondary | ICD-10-CM | POA: Diagnosis not present

## 2019-02-14 DIAGNOSIS — E559 Vitamin D deficiency, unspecified: Secondary | ICD-10-CM | POA: Diagnosis not present

## 2019-02-14 DIAGNOSIS — F0280 Dementia in other diseases classified elsewhere without behavioral disturbance: Secondary | ICD-10-CM | POA: Diagnosis not present

## 2019-02-14 DIAGNOSIS — I10 Essential (primary) hypertension: Secondary | ICD-10-CM | POA: Diagnosis not present

## 2019-02-19 ENCOUNTER — Encounter: Payer: Self-pay | Admitting: Neurology

## 2019-02-19 ENCOUNTER — Telehealth: Payer: Medicare HMO | Admitting: Neurology

## 2019-02-19 ENCOUNTER — Telehealth (INDEPENDENT_AMBULATORY_CARE_PROVIDER_SITE_OTHER): Payer: Medicare HMO | Admitting: Neurology

## 2019-02-19 ENCOUNTER — Other Ambulatory Visit: Payer: Self-pay

## 2019-02-19 DIAGNOSIS — I10 Essential (primary) hypertension: Secondary | ICD-10-CM | POA: Diagnosis not present

## 2019-02-19 DIAGNOSIS — G20 Parkinson's disease: Secondary | ICD-10-CM

## 2019-02-19 DIAGNOSIS — I2510 Atherosclerotic heart disease of native coronary artery without angina pectoris: Secondary | ICD-10-CM | POA: Diagnosis not present

## 2019-02-19 DIAGNOSIS — E559 Vitamin D deficiency, unspecified: Secondary | ICD-10-CM | POA: Diagnosis not present

## 2019-02-19 DIAGNOSIS — S72002D Fracture of unspecified part of neck of left femur, subsequent encounter for closed fracture with routine healing: Secondary | ICD-10-CM | POA: Diagnosis not present

## 2019-02-19 DIAGNOSIS — M4800 Spinal stenosis, site unspecified: Secondary | ICD-10-CM | POA: Diagnosis not present

## 2019-02-19 DIAGNOSIS — F0280 Dementia in other diseases classified elsewhere without behavioral disturbance: Secondary | ICD-10-CM | POA: Diagnosis not present

## 2019-02-19 DIAGNOSIS — E039 Hypothyroidism, unspecified: Secondary | ICD-10-CM | POA: Diagnosis not present

## 2019-02-19 DIAGNOSIS — I4891 Unspecified atrial fibrillation: Secondary | ICD-10-CM | POA: Diagnosis not present

## 2019-02-19 NOTE — Progress Notes (Signed)
    Virtual Visit via Telephone Note The purpose of this virtual visit is to provide medical care while limiting exposure to the novel coronavirus.    Consent was obtained for phone visit:  Yes.   Answered questions that patient had about telehealth interaction:  Yes.   I discussed the limitations, risks, security and privacy concerns of performing an evaluation and management service by telephone. I also discussed with the patient that there may be a patient responsible charge related to this service. The patient expressed understanding and agreed to proceed.  Pt location: Home Physician Location: office Name of referring provider:  [name], MD I connected with Chad Russell at patients initiation/request on 02/19/2019 at  2:50 PM EST by telephone and verified that I am speaking with the correct person using two identifiers.  Pt MRN:  [id number] Pt DOB:  1926/12/20   History of Present Illness: This is a 85 year old man returns for follow-up of neuropathy and Parkinson's disease. His wife is also present on the phone.  He suffered a fall in October and fractured his left femur.  He went to [facility] for rehab and returned home in early December. He gets home PT and able to walk with a walker.  He denies any progression of Parkinson's disease, but continues to have difficulty remember to taking his mid-day sinemet dose.  He denies any tremor or stiffness.  He is able to bathe himself, wife helps with dressing.   Assessment and Plan:   1. Parkinson disease, stable  - Patient would like to adjust his sinemet to 1.5 twice daily - take at 9am and 2pm   - Continue PT for gait instability   2. Peripheral neuropathy contributed by peripheral arterial disease, stable.   Follow Up Instructions:   I discussed the assessment and treatment plan with the patient. The patient was provided an opportunity to ask questions and all were answered. The patient agreed with the plan and  demonstrated an understanding of the instructions.   The patient was advised to call back or seek an in-person evaluation if the symptoms worsen or if the condition fails to improve as anticipated.  Return to clinic in 4 months  Total Time spent in visit with the patient was:  12 min.   Pt understands and agrees with the plan of care outlined.     Alda Berthold, DO

## 2019-02-21 ENCOUNTER — Ambulatory Visit (INDEPENDENT_AMBULATORY_CARE_PROVIDER_SITE_OTHER): Payer: Medicare HMO | Admitting: Family Medicine

## 2019-02-21 VITALS — BP 150/64 | Ht 71.0 in | Wt 173.0 lb

## 2019-02-21 DIAGNOSIS — E039 Hypothyroidism, unspecified: Secondary | ICD-10-CM | POA: Diagnosis not present

## 2019-02-21 DIAGNOSIS — I10 Essential (primary) hypertension: Secondary | ICD-10-CM | POA: Diagnosis not present

## 2019-02-21 DIAGNOSIS — Z0000 Encounter for general adult medical examination without abnormal findings: Secondary | ICD-10-CM

## 2019-02-21 DIAGNOSIS — G20 Parkinson's disease: Secondary | ICD-10-CM | POA: Diagnosis not present

## 2019-02-21 DIAGNOSIS — M4800 Spinal stenosis, site unspecified: Secondary | ICD-10-CM | POA: Diagnosis not present

## 2019-02-21 DIAGNOSIS — S72002D Fracture of unspecified part of neck of left femur, subsequent encounter for closed fracture with routine healing: Secondary | ICD-10-CM | POA: Diagnosis not present

## 2019-02-21 DIAGNOSIS — E559 Vitamin D deficiency, unspecified: Secondary | ICD-10-CM | POA: Diagnosis not present

## 2019-02-21 DIAGNOSIS — F0280 Dementia in other diseases classified elsewhere without behavioral disturbance: Secondary | ICD-10-CM | POA: Diagnosis not present

## 2019-02-21 DIAGNOSIS — I4891 Unspecified atrial fibrillation: Secondary | ICD-10-CM | POA: Diagnosis not present

## 2019-02-21 DIAGNOSIS — I2510 Atherosclerotic heart disease of native coronary artery without angina pectoris: Secondary | ICD-10-CM | POA: Diagnosis not present

## 2019-02-21 NOTE — Patient Instructions (Addendum)
Thank you for taking time to come for your Medicare Wellness Visit. I appreciate your ongoing commitment to your health goals. Please review the following plan we discussed and let me know if I can assist you in the future.  Chad Kennedy LPN  Preventive Care 83 Years and Older, Male Preventive care refers to lifestyle choices and visits with your health care provider that can promote health and wellness. This includes:  A yearly physical exam. This is also called an annual well check.  Regular dental and eye exams.  Immunizations.  Screening for certain conditions.  Healthy lifestyle choices, such as diet and exercise. What can I expect for my preventive care visit? Physical exam Your health care provider will check:  Height and weight. These may be used to calculate body mass index (BMI), which is a measurement that tells if you are at a healthy weight.  Heart rate and blood pressure.  Your skin for abnormal spots. Counseling Your health care provider may ask you questions about:  Alcohol, tobacco, and drug use.  Emotional well-being.  Home and relationship well-being.  Sexual activity.  Eating habits.  History of falls.  Memory and ability to understand (cognition).  Work and work Statistician. What immunizations do I need?  Influenza (flu) vaccine  This is recommended every year. Tetanus, diphtheria, and pertussis (Tdap) vaccine  You may need a Td booster every 10 years. Varicella (chickenpox) vaccine  You may need this vaccine if you have not already been vaccinated. Zoster (shingles) vaccine  You may need this after age 66. Pneumococcal conjugate (PCV13) vaccine  One dose is recommended after age 84. Pneumococcal polysaccharide (PPSV23) vaccine  One dose is recommended after age 88. Measles, mumps, and rubella (MMR) vaccine  You may need at least one dose of MMR if you were born in 1957 or later. You may also need a second dose. Meningococcal  conjugate (MenACWY) vaccine  You may need this if you have certain conditions. Hepatitis A vaccine  You may need this if you have certain conditions or if you travel or work in places where you may be exposed to hepatitis A. Hepatitis B vaccine  You may need this if you have certain conditions or if you travel or work in places where you may be exposed to hepatitis B. Haemophilus influenzae type b (Hib) vaccine  You may need this if you have certain conditions. You may receive vaccines as individual doses or as more than one vaccine together in one shot (combination vaccines). Talk with your health care provider about the risks and benefits of combination vaccines. What tests do I need? Blood tests  Lipid and cholesterol levels. These may be checked every 5 years, or more frequently depending on your overall health.  Hepatitis C test.  Hepatitis B test. Screening  Lung cancer screening. You may have this screening every year starting at age 24 if you have a 30-pack-year history of smoking and currently smoke or have quit within the past 15 years.  Colorectal cancer screening. All adults should have this screening starting at age 52 and continuing until age 61. Your health care provider may recommend screening at age 57 if you are at increased risk. You will have tests every 1-10 years, depending on your results and the type of screening test.  Prostate cancer screening. Recommendations will vary depending on your family history and other risks.  Diabetes screening. This is done by checking your blood sugar (glucose) after you have not eaten for  a while (fasting). You may have this done every 1-3 years.  Abdominal aortic aneurysm (AAA) screening. You may need this if you are a current or former smoker.  Sexually transmitted disease (STD) testing. Follow these instructions at home: Eating and drinking  Eat a diet that includes fresh fruits and vegetables, whole grains, lean  protein, and low-fat dairy products. Limit your intake of foods with high amounts of sugar, saturated fats, and salt.  Take vitamin and mineral supplements as recommended by your health care provider.  Do not drink alcohol if your health care provider tells you not to drink.  If you drink alcohol: ? Limit how much you have to 0-2 drinks a day. ? Be aware of how much alcohol is in your drink. In the [place] one drink equals one 12 oz bottle of beer (355 mL), one 5 oz glass of wine (148 mL), or one 1 oz glass of hard liquor (44 mL). Lifestyle  Take daily care of your teeth and gums.  Stay active. Exercise for at least 30 minutes on 5 or more days each week.  Do not use any products that contain nicotine or tobacco, such as cigarettes, e-cigarettes, and chewing tobacco. If you need help quitting, ask your health care provider.  If you are sexually active, practice safe sex. Use a condom or other form of protection to prevent STIs (sexually transmitted infections).  Talk with your health care provider about taking a low-dose aspirin or statin. What's next?  Visit your health care provider once a year for a well check visit.  Ask your health care provider how often you should have your eyes and teeth checked.  Stay up to date on all vaccines. This information is not intended to replace advice given to you by your health care provider. Make sure you discuss any questions you have with your health care provider. Document Released: 03/07/2015 Document Revised: 02/02/2018 Document Reviewed: 02/02/2018 Elsevier Patient Education  2020 [employer].

## 2019-02-21 NOTE — Progress Notes (Signed)
Presents today for [employer] Visit   Date of last exam: 01/29/2019  Interpreter used for this visit?  No  I connected with  Chad Russell on 02/21/19 by a telephone  and verified that I am speaking with the correct person using two identifiers.   I discussed the limitations of evaluation and management by telemedicine. The patient expressed understanding and agreed to proceed.    Patient Care Team: [name], MD as PCP - General (Family Medicine) Minus Breeding, MD as PCP - Cardiology (Cardiology) Minus Breeding, MD as Consulting Physician (Cardiology) Sydnee Levans, MD as Consulting Physician (Dermatology) Irine Seal, MD as Attending Physician (Urology) Zadie Rhine Clent Demark, MD as Consulting Physician (Ophthalmology) Alda Berthold, DO as Consulting Physician (Neurology)   Other items to address today:   Discussed immunizations  Discussed Eye/Dental     ADVANCE DIRECTIVES: Discussed:yes On File no Materials Provided: yes  Immunization status:  Immunization History  Administered Date(s) Administered  . Fluad Quad(high Dose 65+) 10/26/2018  . Influenza Nasal 11/05/2012  . Influenza Split 11/15/2011  . Influenza, High Dose Seasonal PF 10/27/2017  . Influenza,inj,Quad PF,6+ Mos 12/04/2013, 12/06/2014, 11/12/2016  . Influenza-Unspecified 11/19/2015  . Pneumococcal Conjugate-13 03/13/2015  . Pneumococcal Polysaccharide-23 11/12/2016, 12/21/2018     There are no preventive care reminders to display for this patient.   Functional Status Survey: Is the patient deaf or have difficulty hearing?: No Does the patient have difficulty seeing, even when wearing glasses/contacts?: No Does the patient have difficulty concentrating, remembering, or making decisions?: No Does the patient have difficulty walking or climbing stairs?: Yes(fracture hip) Does the patient have difficulty dressing or bathing?: No Does the patient have difficulty  doing errands alone such as visiting a doctor's office or shopping?: No   6CIT Screen 02/21/2019 02/21/2019 12/16/2016  What Year? 0 points 0 points 0 points  What month? 0 points 0 points 0 points  What time? 0 points 0 points 0 points  Count back from 20 0 points 0 points 0 points  Months in reverse 0 points 0 points 0 points  Repeat phrase 0 points 0 points 0 points  Total Score 0 0 0        Clinical Support from 02/21/2019 in [facility] [place]  AUDIT-C Score  5       Home Environment:   Lives in two home Sleeping on couch  Recovering from fall with a fracture of left femoral neck. Yes trouble climbing stairs Uses a walker No scattered rugs no grab bars  Adequate lighting/no clutter  Wife is taking care of everything for him at home     Timed warm up N/A   Patient Active Problem List   Diagnosis Date Noted  . AKI (acute kidney injury) ([place]) 12/15/2018  . Hematoma of scalp   . Heart block   . Chest pain 05/04/2018  . Fracture of femoral neck, left ([place]) 04/19/2017  . MGUS (monoclonal gammopathy of unknown significance) 01/15/2017  . BPH with obstruction/lower urinary tract symptoms 09/07/2016  . Preoperative cardiovascular examination 08/24/2016  . Urinary retention 05/13/2016  . Balance problem 10/31/2013  . OSA (obstructive sleep apnea) 10/31/2013  . BPH (benign prostatic hyperplasia) 08/28/2012  . Atrial fibrillation ([place]) 06/08/2012  . Hyperplasia of prostate with lower urinary tract symptoms (LUTS) 04/28/2012  . Moderate malnutrition ([employer]) 04/28/2012  . Infection of urinary tract - recurrent 04/26/2012  . NSTEMI (non-ST elevated myocardial infarction) ([place]) 03/15/2011  . Complete heart  block ([place]) 03/15/2011  . HTN (hypertension) 03/15/2011     Past Medical History:  Diagnosis Date  . Arthritis   . Atrial fibrillation ([place])   . Balance problem 10/31/2013  . BPH (benign prostatic hyperplasia) 2014   Urology Dr [name] with [facility] [phone]     . Bradycardia    a. baseline HR in the 30's. Asymptomatic - no history of PPM placement.   Marland Kitchen CAD (coronary artery disease)    LHC 03/16/11: Mid LAD 10-20%, proximal circumflex 10%, mid RCA 20%, EF 55%.  . Cataract   . Complete heart block ([place])   . Fracture of femoral neck, right ([place]) 04/19/2017  . GERD (gastroesophageal reflux disease)    takes occasional  zantac  . Hx of echocardiogram    Echo 02/2011: EF 65-70%, normal wall motion, MAC, mild MR, mild LAE, mild RVE  . Hypertension   . OSA (obstructive sleep apnea)    per patient went to have sleep study done about 2 years ago, bu they never F/U with him about results; but wife reports he still has periods of apnea at night   . Talar fracture    casted no surg     Past Surgical History:  Procedure Laterality Date  . ABDOMINAL AORTOGRAM W/LOWER EXTREMITY N/A 01/17/2017   Procedure: ABDOMINAL AORTOGRAM W/LOWER EXTREMITY;  Surgeon: Angelia Mould, MD;  Location: [facility];  Service: Cardiovascular;  Laterality: N/A;  . COLONOSCOPY     Repeated and normal in 2011  . FRACTURE SURGERY    . HEMIARTHROPLASTY HIP Right 04/19/2017  . HEMORROIDECTOMY    . HIP ARTHROPLASTY Right 04/19/2017   Procedure: ARTHROPLASTY BIPOLAR HIP (HEMIARTHROPLASTY);  Surgeon: Marchia Bond, MD;  Location: [facility];  Service: Orthopedics;  Laterality: Right;  . HIP ARTHROPLASTY Left 12/16/2018   Procedure: ARTHROPLASTY BIPOLAR HIP (HEMIARTHROPLASTY);  Surgeon: Altamese Woodacre, MD;  Location: [facility];  Service: Orthopedics;  Laterality: Left;  . INGUINAL HERNIA REPAIR Right 02/26/2014   Procedure: LAPAROSCOPIC RIGHT INGUINAL HERNIA REPAIR;  Surgeon: Ralene Ok, MD;  Location: [facility];  Service: General;  Laterality: Right;  . INGUINAL HERNIA REPAIR    . INSERTION OF MESH Right 02/26/2014   Procedure: INSERTION OF MESH;  Surgeon: Ralene Ok, MD;  Location: [facility];  Service: General;  Laterality: Right;  . LEFT HEART CATHETERIZATION WITH CORONARY  ANGIOGRAM N/A 03/16/2011   Procedure: LEFT HEART CATHETERIZATION WITH CORONARY ANGIOGRAM;  Surgeon: Peter M Martinique, MD;  Location: [facility];  Service: Cardiovascular;  Laterality: N/A;  . PROSTATE BIOPSY     negative - [facility]  . TRANSURETHRAL RESECTION OF PROSTATE N/A 09/07/2016   Procedure: TRANSURETHRAL RESECTION OF THE PROSTATE (TURP);  Surgeon: Irine Seal, MD;  Location: [facility];  Service: Urology;  Laterality: N/A;     Family History  Problem Relation Age of Onset  . Hypertension Mother   . Multiple sclerosis Sister      Social History   Socioeconomic History  . Marital status: Married    Spouse name: Mardene Celeste  . Number of children: 0  . Years of education: 37  . Highest education level: Not on file  Occupational History  . Occupation: Scientist, product/process development  . Occupation: Civil engineer, contracting  Tobacco Use  . Smoking status: Former Smoker    Packs/day: 1.00    Years: 3.00    Pack years: 3.00    Types: Cigarettes    Quit date: 03/17/1955    Years since quitting: 63.9  .  Smokeless tobacco: Former Network engineer and Sexual Activity  . Alcohol use: Yes    Alcohol/week: 14.0 standard drinks    Types: 7 Cans of beer, 7 Standard drinks or equivalent per week    Comment: daily  . Drug use: No  . Sexual activity: Not Currently    Birth control/protection: None  Other Topics Concern  . 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  Has no children.     Right handed    Social Determinants of Health   Financial Resource Strain:   . Difficulty of Paying Living Expenses: Not on file  Food Insecurity:   . Worried About Charity fundraiser in the Last Year: Not on file  . Ran Out of Food in the Last Year: Not on file  Transportation Needs:   . Lack of Transportation (Medical): Not on file    . Lack of Transportation (Non-Medical): Not on file  Physical Activity:   . Days of Exercise per Week: Not on file  . Minutes of Exercise per Session: Not on file  Stress:   . Feeling of Stress : Not on file  Social Connections:   . Frequency of Communication with Friends and Family: Not on file  . Frequency of Social Gatherings with Friends and Family: Not on file  . Attends Religious Services: Not on file  . Active Member of Clubs or Organizations: Not on file  . Attends Archivist Meetings: Not on file  . Marital Status: Not on file  Intimate Partner Violence:   . Fear of Current or Ex-Partner: Not on file  . Emotionally Abused: Not on file  . Physically Abused: Not on file  . Sexually Abused: Not on file     Allergies  Allergen Reactions  . Doxycycline Other (See Comments)    Upset stomach  . Keflex [Cephalexin] Other (See Comments)    Abdominal discomfort 04-19-17 Pt has tolerated orally     Prior to Admission medications   Medication Sig Start Date End Date Taking? Authorizing Provider  acetaminophen (TYLENOL) 500 MG tablet Take 1 tablet (500 mg total) by mouth every 8 (eight) hours. 12/26/18  Yes Debbe Odea, MD  amLODipine (NORVASC) 10 MG tablet Take 1 tablet (10 mg total) by mouth daily. 12/26/18  Yes Debbe Odea, MD  bisacodyl (DULCOLAX) 5 MG EC tablet Take 1 tablet (5 mg total) by mouth daily as needed for moderate constipation. 12/26/18  Yes Debbe Odea, MD  carbidopa-levodopa (SINEMET) 25-100 MG tablet Take 1 tablet by mouth 3 (three) times daily. 06/20/18  Yes Patel, Donika K, DO  feeding supplement, ENSURE ENLIVE, (ENSURE ENLIVE) LIQD Take 237 mLs by mouth 2 (two) times daily between meals. 12/26/18  Yes Debbe Odea, MD  lisinopril-hydrochlorothiazide (ZESTORETIC) 10-12.5 MG tablet TAKE ONE TABLET BY MOUTH DAILY 01/15/19  Yes Minus Breeding, MD  Misc Natural Products (PROSTATE) CAPS Take 1 tablet daily by mouth. Prostate Plus otc supplement   Yes  [provider]  Multiple Vitamin (MULTIVITAMIN WITH MINERALS) TABS tablet Take 1 tablet daily by mouth.   Yes [provider]  Multiple Vitamins-Minerals (PRESERVISION AREDS 2+MULTI VIT PO) Take 1 capsule by mouth 2 (two) times daily.   Yes [provider]  rivaroxaban (XARELTO) 20  MG TABS tablet Take 1 tablet (20 mg total) by mouth daily with supper. 08/14/18  Yes Minus Breeding, MD  senna (SENOKOT) 8.6 MG TABS tablet Take 1 tablet (8.6 mg total) by mouth at bedtime as needed for mild constipation. 12/26/18  Yes Debbe Odea, MD  timolol (TIMOPTIC) 0.5 % ophthalmic solution One drop in both eyes in the morning and one drop in the left eye at night. Patient taking differently: Place 1 drop into both eyes 2 (two) times daily.  04/22/17  Yes Alphonzo Grieve, MD  vitamin C (ASCORBIC ACID) 500 MG tablet Take 500 mg by mouth daily.   Yes [provider]  lisinopril (ZESTRIL) 20 MG tablet Take 1 tablet (20 mg total) by mouth daily. Patient not taking: Reported on 02/21/2019 12/26/18   Debbe Odea, MD     Depression screen [facility] 2/9 02/21/2019 02/21/2019 11/02/2018 03/28/2018 11/17/2017  Decreased Interest 0 0 0 0 0  Down, Depressed, Hopeless 0 0 0 0 0  PHQ - 2 Score 0 0 0 0 0     Fall Risk  02/21/2019 02/21/2019 02/19/2019 11/02/2018 06/20/2018  Falls in the past year? 1 1 0 0 0  Comment - - - - -  Number falls in past yr: 1 0 0 0 0  Injury with Fall? 1 1 0 0 0  Comment - fracture of left femoral neck - - -  Risk Factor Category  - - - - -  Risk for fall due to : History of fall(s) - - - -  Follow up Falls evaluation completed;Education provided Falls evaluation completed;Education provided - Falls evaluation completed Falls evaluation completed      PHYSICAL EXAM: BP (!) 150/64 Comment: taken from previous visit  Ht _0  (1.803 m)   Wt 173 lb (78.5 kg)   BMI 24.13 kg/m    Wt Readings from Last 3 Encounters:  02/21/19 173 lb (78.5 kg)  12/15/18 173  lb 5.9 oz (78.6 kg)  11/24/18 173 lb 6.4 oz (78.7 kg)       Education/Counseling provided regarding diet and exercise, prevention of chronic diseases, smoking/tobacco cessation, if applicable, and reviewed "Covered Medicare Preventive Services."

## 2019-02-23 DIAGNOSIS — E039 Hypothyroidism, unspecified: Secondary | ICD-10-CM | POA: Diagnosis not present

## 2019-02-23 DIAGNOSIS — F0280 Dementia in other diseases classified elsewhere without behavioral disturbance: Secondary | ICD-10-CM | POA: Diagnosis not present

## 2019-02-23 DIAGNOSIS — M4800 Spinal stenosis, site unspecified: Secondary | ICD-10-CM | POA: Diagnosis not present

## 2019-02-23 DIAGNOSIS — E559 Vitamin D deficiency, unspecified: Secondary | ICD-10-CM | POA: Diagnosis not present

## 2019-02-23 DIAGNOSIS — S72002D Fracture of unspecified part of neck of left femur, subsequent encounter for closed fracture with routine healing: Secondary | ICD-10-CM | POA: Diagnosis not present

## 2019-02-23 DIAGNOSIS — I4891 Unspecified atrial fibrillation: Secondary | ICD-10-CM | POA: Diagnosis not present

## 2019-02-23 DIAGNOSIS — I10 Essential (primary) hypertension: Secondary | ICD-10-CM | POA: Diagnosis not present

## 2019-02-23 DIAGNOSIS — G20 Parkinson's disease: Secondary | ICD-10-CM | POA: Diagnosis not present

## 2019-02-23 DIAGNOSIS — I2510 Atherosclerotic heart disease of native coronary artery without angina pectoris: Secondary | ICD-10-CM | POA: Diagnosis not present

## 2019-02-26 DIAGNOSIS — I4891 Unspecified atrial fibrillation: Secondary | ICD-10-CM | POA: Diagnosis not present

## 2019-02-26 DIAGNOSIS — I10 Essential (primary) hypertension: Secondary | ICD-10-CM | POA: Diagnosis not present

## 2019-02-26 DIAGNOSIS — F0280 Dementia in other diseases classified elsewhere without behavioral disturbance: Secondary | ICD-10-CM | POA: Diagnosis not present

## 2019-02-26 DIAGNOSIS — E039 Hypothyroidism, unspecified: Secondary | ICD-10-CM | POA: Diagnosis not present

## 2019-02-26 DIAGNOSIS — I2510 Atherosclerotic heart disease of native coronary artery without angina pectoris: Secondary | ICD-10-CM | POA: Diagnosis not present

## 2019-02-26 DIAGNOSIS — M4800 Spinal stenosis, site unspecified: Secondary | ICD-10-CM | POA: Diagnosis not present

## 2019-02-26 DIAGNOSIS — G20 Parkinson's disease: Secondary | ICD-10-CM | POA: Diagnosis not present

## 2019-02-26 DIAGNOSIS — S72002D Fracture of unspecified part of neck of left femur, subsequent encounter for closed fracture with routine healing: Secondary | ICD-10-CM | POA: Diagnosis not present

## 2019-02-26 DIAGNOSIS — E559 Vitamin D deficiency, unspecified: Secondary | ICD-10-CM | POA: Diagnosis not present

## 2019-03-07 DIAGNOSIS — G20 Parkinson's disease: Secondary | ICD-10-CM | POA: Diagnosis not present

## 2019-03-07 DIAGNOSIS — E039 Hypothyroidism, unspecified: Secondary | ICD-10-CM | POA: Diagnosis not present

## 2019-03-07 DIAGNOSIS — I2510 Atherosclerotic heart disease of native coronary artery without angina pectoris: Secondary | ICD-10-CM | POA: Diagnosis not present

## 2019-03-07 DIAGNOSIS — I4891 Unspecified atrial fibrillation: Secondary | ICD-10-CM | POA: Diagnosis not present

## 2019-03-07 DIAGNOSIS — I10 Essential (primary) hypertension: Secondary | ICD-10-CM | POA: Diagnosis not present

## 2019-03-07 DIAGNOSIS — M4800 Spinal stenosis, site unspecified: Secondary | ICD-10-CM | POA: Diagnosis not present

## 2019-03-07 DIAGNOSIS — E559 Vitamin D deficiency, unspecified: Secondary | ICD-10-CM | POA: Diagnosis not present

## 2019-03-07 DIAGNOSIS — F0280 Dementia in other diseases classified elsewhere without behavioral disturbance: Secondary | ICD-10-CM | POA: Diagnosis not present

## 2019-03-07 DIAGNOSIS — S72002D Fracture of unspecified part of neck of left femur, subsequent encounter for closed fracture with routine healing: Secondary | ICD-10-CM | POA: Diagnosis not present

## 2019-03-14 ENCOUNTER — Ambulatory Visit: Payer: Medicare HMO | Attending: Family Medicine

## 2019-03-14 DIAGNOSIS — Z23 Encounter for immunization: Secondary | ICD-10-CM | POA: Insufficient documentation

## 2019-03-14 NOTE — Progress Notes (Signed)
   Covid-19 Vaccination Clinic  Name:  Chad Russell    MRN: [id number] DOB: 1926/03/06  03/14/2019  Mr. Morning was observed post Covid-19 immunization for 15 minutes without incidence. He was provided with Vaccine Information Sheet and instruction to access the V-Safe system.   [name] was instructed to call 911 with any severe reactions post vaccine: Marland Kitchen Difficulty breathing  . Swelling of your face and throat  . A fast heartbeat  . A bad rash all over your body  . Dizziness and weakness    Immunizations Administered    Name Date Dose VIS Date Route   Pfizer COVID-19 Vaccine 03/14/2019  4:13 PM 0.3 mL 02/02/2019 Intramuscular   Manufacturer: [place]   Lot: [id number]   [place]: [id number]

## 2019-03-16 DIAGNOSIS — I10 Essential (primary) hypertension: Secondary | ICD-10-CM | POA: Diagnosis not present

## 2019-03-16 DIAGNOSIS — I4891 Unspecified atrial fibrillation: Secondary | ICD-10-CM | POA: Diagnosis not present

## 2019-03-16 DIAGNOSIS — S72002D Fracture of unspecified part of neck of left femur, subsequent encounter for closed fracture with routine healing: Secondary | ICD-10-CM | POA: Diagnosis not present

## 2019-03-16 DIAGNOSIS — E559 Vitamin D deficiency, unspecified: Secondary | ICD-10-CM | POA: Diagnosis not present

## 2019-03-16 DIAGNOSIS — M4800 Spinal stenosis, site unspecified: Secondary | ICD-10-CM | POA: Diagnosis not present

## 2019-03-16 DIAGNOSIS — F0280 Dementia in other diseases classified elsewhere without behavioral disturbance: Secondary | ICD-10-CM | POA: Diagnosis not present

## 2019-03-16 DIAGNOSIS — G20 Parkinson's disease: Secondary | ICD-10-CM | POA: Diagnosis not present

## 2019-03-16 DIAGNOSIS — E039 Hypothyroidism, unspecified: Secondary | ICD-10-CM | POA: Diagnosis not present

## 2019-03-16 DIAGNOSIS — I2510 Atherosclerotic heart disease of native coronary artery without angina pectoris: Secondary | ICD-10-CM | POA: Diagnosis not present

## 2019-03-21 DIAGNOSIS — S72002D Fracture of unspecified part of neck of left femur, subsequent encounter for closed fracture with routine healing: Secondary | ICD-10-CM | POA: Diagnosis not present

## 2019-03-22 DIAGNOSIS — F0280 Dementia in other diseases classified elsewhere without behavioral disturbance: Secondary | ICD-10-CM | POA: Diagnosis not present

## 2019-03-22 DIAGNOSIS — I10 Essential (primary) hypertension: Secondary | ICD-10-CM | POA: Diagnosis not present

## 2019-03-22 DIAGNOSIS — E559 Vitamin D deficiency, unspecified: Secondary | ICD-10-CM | POA: Diagnosis not present

## 2019-03-22 DIAGNOSIS — E039 Hypothyroidism, unspecified: Secondary | ICD-10-CM | POA: Diagnosis not present

## 2019-03-22 DIAGNOSIS — I4891 Unspecified atrial fibrillation: Secondary | ICD-10-CM | POA: Diagnosis not present

## 2019-03-22 DIAGNOSIS — M4800 Spinal stenosis, site unspecified: Secondary | ICD-10-CM | POA: Diagnosis not present

## 2019-03-22 DIAGNOSIS — G20 Parkinson's disease: Secondary | ICD-10-CM | POA: Diagnosis not present

## 2019-03-22 DIAGNOSIS — I2510 Atherosclerotic heart disease of native coronary artery without angina pectoris: Secondary | ICD-10-CM | POA: Diagnosis not present

## 2019-03-22 DIAGNOSIS — S72002D Fracture of unspecified part of neck of left femur, subsequent encounter for closed fracture with routine healing: Secondary | ICD-10-CM | POA: Diagnosis not present

## 2019-03-23 DIAGNOSIS — M4800 Spinal stenosis, site unspecified: Secondary | ICD-10-CM | POA: Diagnosis not present

## 2019-03-23 DIAGNOSIS — I10 Essential (primary) hypertension: Secondary | ICD-10-CM | POA: Diagnosis not present

## 2019-03-23 DIAGNOSIS — S72002D Fracture of unspecified part of neck of left femur, subsequent encounter for closed fracture with routine healing: Secondary | ICD-10-CM | POA: Diagnosis not present

## 2019-03-23 DIAGNOSIS — I2510 Atherosclerotic heart disease of native coronary artery without angina pectoris: Secondary | ICD-10-CM | POA: Diagnosis not present

## 2019-03-23 DIAGNOSIS — E039 Hypothyroidism, unspecified: Secondary | ICD-10-CM | POA: Diagnosis not present

## 2019-03-23 DIAGNOSIS — G20 Parkinson's disease: Secondary | ICD-10-CM | POA: Diagnosis not present

## 2019-03-23 DIAGNOSIS — F0280 Dementia in other diseases classified elsewhere without behavioral disturbance: Secondary | ICD-10-CM | POA: Diagnosis not present

## 2019-03-23 DIAGNOSIS — E559 Vitamin D deficiency, unspecified: Secondary | ICD-10-CM | POA: Diagnosis not present

## 2019-03-23 DIAGNOSIS — I4891 Unspecified atrial fibrillation: Secondary | ICD-10-CM | POA: Diagnosis not present

## 2019-03-25 DIAGNOSIS — I10 Essential (primary) hypertension: Secondary | ICD-10-CM | POA: Diagnosis not present

## 2019-03-25 DIAGNOSIS — I2510 Atherosclerotic heart disease of native coronary artery without angina pectoris: Secondary | ICD-10-CM | POA: Diagnosis not present

## 2019-03-25 DIAGNOSIS — S72002D Fracture of unspecified part of neck of left femur, subsequent encounter for closed fracture with routine healing: Secondary | ICD-10-CM | POA: Diagnosis not present

## 2019-03-25 DIAGNOSIS — F0280 Dementia in other diseases classified elsewhere without behavioral disturbance: Secondary | ICD-10-CM | POA: Diagnosis not present

## 2019-03-25 DIAGNOSIS — M4800 Spinal stenosis, site unspecified: Secondary | ICD-10-CM | POA: Diagnosis not present

## 2019-03-25 DIAGNOSIS — E559 Vitamin D deficiency, unspecified: Secondary | ICD-10-CM | POA: Diagnosis not present

## 2019-03-25 DIAGNOSIS — I4891 Unspecified atrial fibrillation: Secondary | ICD-10-CM | POA: Diagnosis not present

## 2019-03-25 DIAGNOSIS — E039 Hypothyroidism, unspecified: Secondary | ICD-10-CM | POA: Diagnosis not present

## 2019-03-25 DIAGNOSIS — G20 Parkinson's disease: Secondary | ICD-10-CM | POA: Diagnosis not present

## 2019-03-28 DIAGNOSIS — M4800 Spinal stenosis, site unspecified: Secondary | ICD-10-CM | POA: Diagnosis not present

## 2019-03-28 DIAGNOSIS — G20 Parkinson's disease: Secondary | ICD-10-CM | POA: Diagnosis not present

## 2019-03-28 DIAGNOSIS — F0280 Dementia in other diseases classified elsewhere without behavioral disturbance: Secondary | ICD-10-CM | POA: Diagnosis not present

## 2019-03-28 DIAGNOSIS — I10 Essential (primary) hypertension: Secondary | ICD-10-CM | POA: Diagnosis not present

## 2019-03-28 DIAGNOSIS — I2510 Atherosclerotic heart disease of native coronary artery without angina pectoris: Secondary | ICD-10-CM | POA: Diagnosis not present

## 2019-03-28 DIAGNOSIS — I4891 Unspecified atrial fibrillation: Secondary | ICD-10-CM | POA: Diagnosis not present

## 2019-03-28 DIAGNOSIS — E039 Hypothyroidism, unspecified: Secondary | ICD-10-CM | POA: Diagnosis not present

## 2019-03-28 DIAGNOSIS — S72002D Fracture of unspecified part of neck of left femur, subsequent encounter for closed fracture with routine healing: Secondary | ICD-10-CM | POA: Diagnosis not present

## 2019-03-28 DIAGNOSIS — E559 Vitamin D deficiency, unspecified: Secondary | ICD-10-CM | POA: Diagnosis not present

## 2019-03-29 DIAGNOSIS — Z85828 Personal history of other malignant neoplasm of skin: Secondary | ICD-10-CM | POA: Diagnosis not present

## 2019-03-29 DIAGNOSIS — L821 Other seborrheic keratosis: Secondary | ICD-10-CM | POA: Diagnosis not present

## 2019-03-29 DIAGNOSIS — L853 Xerosis cutis: Secondary | ICD-10-CM | POA: Diagnosis not present

## 2019-03-29 DIAGNOSIS — L218 Other seborrheic dermatitis: Secondary | ICD-10-CM | POA: Diagnosis not present

## 2019-03-29 DIAGNOSIS — D1801 Hemangioma of skin and subcutaneous tissue: Secondary | ICD-10-CM | POA: Diagnosis not present

## 2019-03-30 DIAGNOSIS — E559 Vitamin D deficiency, unspecified: Secondary | ICD-10-CM | POA: Diagnosis not present

## 2019-03-30 DIAGNOSIS — G20 Parkinson's disease: Secondary | ICD-10-CM | POA: Diagnosis not present

## 2019-03-30 DIAGNOSIS — I10 Essential (primary) hypertension: Secondary | ICD-10-CM | POA: Diagnosis not present

## 2019-03-30 DIAGNOSIS — I2510 Atherosclerotic heart disease of native coronary artery without angina pectoris: Secondary | ICD-10-CM | POA: Diagnosis not present

## 2019-03-30 DIAGNOSIS — M4800 Spinal stenosis, site unspecified: Secondary | ICD-10-CM | POA: Diagnosis not present

## 2019-03-30 DIAGNOSIS — F0280 Dementia in other diseases classified elsewhere without behavioral disturbance: Secondary | ICD-10-CM | POA: Diagnosis not present

## 2019-03-30 DIAGNOSIS — E039 Hypothyroidism, unspecified: Secondary | ICD-10-CM | POA: Diagnosis not present

## 2019-03-30 DIAGNOSIS — I4891 Unspecified atrial fibrillation: Secondary | ICD-10-CM | POA: Diagnosis not present

## 2019-03-30 DIAGNOSIS — S72002D Fracture of unspecified part of neck of left femur, subsequent encounter for closed fracture with routine healing: Secondary | ICD-10-CM | POA: Diagnosis not present

## 2019-04-02 DIAGNOSIS — I2510 Atherosclerotic heart disease of native coronary artery without angina pectoris: Secondary | ICD-10-CM | POA: Diagnosis not present

## 2019-04-02 DIAGNOSIS — I4891 Unspecified atrial fibrillation: Secondary | ICD-10-CM | POA: Diagnosis not present

## 2019-04-02 DIAGNOSIS — S72002D Fracture of unspecified part of neck of left femur, subsequent encounter for closed fracture with routine healing: Secondary | ICD-10-CM | POA: Diagnosis not present

## 2019-04-02 DIAGNOSIS — I10 Essential (primary) hypertension: Secondary | ICD-10-CM | POA: Diagnosis not present

## 2019-04-02 DIAGNOSIS — G20 Parkinson's disease: Secondary | ICD-10-CM | POA: Diagnosis not present

## 2019-04-02 DIAGNOSIS — M4800 Spinal stenosis, site unspecified: Secondary | ICD-10-CM | POA: Diagnosis not present

## 2019-04-02 DIAGNOSIS — F0280 Dementia in other diseases classified elsewhere without behavioral disturbance: Secondary | ICD-10-CM | POA: Diagnosis not present

## 2019-04-02 DIAGNOSIS — E559 Vitamin D deficiency, unspecified: Secondary | ICD-10-CM | POA: Diagnosis not present

## 2019-04-02 DIAGNOSIS — E039 Hypothyroidism, unspecified: Secondary | ICD-10-CM | POA: Diagnosis not present

## 2019-04-04 ENCOUNTER — Ambulatory Visit: Payer: Medicare HMO | Attending: Internal Medicine

## 2019-04-04 DIAGNOSIS — I10 Essential (primary) hypertension: Secondary | ICD-10-CM | POA: Diagnosis not present

## 2019-04-04 DIAGNOSIS — E039 Hypothyroidism, unspecified: Secondary | ICD-10-CM | POA: Diagnosis not present

## 2019-04-04 DIAGNOSIS — M4800 Spinal stenosis, site unspecified: Secondary | ICD-10-CM | POA: Diagnosis not present

## 2019-04-04 DIAGNOSIS — S72002D Fracture of unspecified part of neck of left femur, subsequent encounter for closed fracture with routine healing: Secondary | ICD-10-CM | POA: Diagnosis not present

## 2019-04-04 DIAGNOSIS — E559 Vitamin D deficiency, unspecified: Secondary | ICD-10-CM | POA: Diagnosis not present

## 2019-04-04 DIAGNOSIS — I2510 Atherosclerotic heart disease of native coronary artery without angina pectoris: Secondary | ICD-10-CM | POA: Diagnosis not present

## 2019-04-04 DIAGNOSIS — F0280 Dementia in other diseases classified elsewhere without behavioral disturbance: Secondary | ICD-10-CM | POA: Diagnosis not present

## 2019-04-04 DIAGNOSIS — I4891 Unspecified atrial fibrillation: Secondary | ICD-10-CM | POA: Diagnosis not present

## 2019-04-04 DIAGNOSIS — G20 Parkinson's disease: Secondary | ICD-10-CM | POA: Diagnosis not present

## 2019-04-04 DIAGNOSIS — Z23 Encounter for immunization: Secondary | ICD-10-CM

## 2019-04-04 NOTE — Progress Notes (Signed)
   Covid-19 Vaccination Clinic  Name:  YI GUILMETTE    MRN: [id number] DOB: [birth year]  04/04/2019  Mr. Silerio was observed post Covid-19 immunization for 15 minutes without incidence. He was provided with Vaccine Information Sheet and instruction to access the V-Safe system.   Mr. Ogonowski was instructed to call 911 with any severe reactions post vaccine: Marland Kitchen Difficulty breathing  . Swelling of your face and throat  . A fast heartbeat  . A bad rash all over your body  . Dizziness and weakness    Immunizations Administered    Name Date Dose VIS Date Route   Pfizer COVID-19 Vaccine 04/04/2019  9:25 AM 0.3 mL 02/02/2019 Intramuscular   Manufacturer: [employer]   Lot: [id number]   [place]: [id number]

## 2019-04-09 DIAGNOSIS — E559 Vitamin D deficiency, unspecified: Secondary | ICD-10-CM | POA: Diagnosis not present

## 2019-04-09 DIAGNOSIS — M4800 Spinal stenosis, site unspecified: Secondary | ICD-10-CM | POA: Diagnosis not present

## 2019-04-09 DIAGNOSIS — I2510 Atherosclerotic heart disease of native coronary artery without angina pectoris: Secondary | ICD-10-CM | POA: Diagnosis not present

## 2019-04-09 DIAGNOSIS — F0280 Dementia in other diseases classified elsewhere without behavioral disturbance: Secondary | ICD-10-CM | POA: Diagnosis not present

## 2019-04-09 DIAGNOSIS — I10 Essential (primary) hypertension: Secondary | ICD-10-CM | POA: Diagnosis not present

## 2019-04-09 DIAGNOSIS — I4891 Unspecified atrial fibrillation: Secondary | ICD-10-CM | POA: Diagnosis not present

## 2019-04-09 DIAGNOSIS — G20 Parkinson's disease: Secondary | ICD-10-CM | POA: Diagnosis not present

## 2019-04-09 DIAGNOSIS — S72002D Fracture of unspecified part of neck of left femur, subsequent encounter for closed fracture with routine healing: Secondary | ICD-10-CM | POA: Diagnosis not present

## 2019-04-09 DIAGNOSIS — E039 Hypothyroidism, unspecified: Secondary | ICD-10-CM | POA: Diagnosis not present

## 2019-04-11 DIAGNOSIS — E039 Hypothyroidism, unspecified: Secondary | ICD-10-CM | POA: Diagnosis not present

## 2019-04-11 DIAGNOSIS — I4891 Unspecified atrial fibrillation: Secondary | ICD-10-CM | POA: Diagnosis not present

## 2019-04-11 DIAGNOSIS — I10 Essential (primary) hypertension: Secondary | ICD-10-CM | POA: Diagnosis not present

## 2019-04-11 DIAGNOSIS — S72002D Fracture of unspecified part of neck of left femur, subsequent encounter for closed fracture with routine healing: Secondary | ICD-10-CM | POA: Diagnosis not present

## 2019-04-11 DIAGNOSIS — F0280 Dementia in other diseases classified elsewhere without behavioral disturbance: Secondary | ICD-10-CM | POA: Diagnosis not present

## 2019-04-11 DIAGNOSIS — G20 Parkinson's disease: Secondary | ICD-10-CM | POA: Diagnosis not present

## 2019-04-11 DIAGNOSIS — I2510 Atherosclerotic heart disease of native coronary artery without angina pectoris: Secondary | ICD-10-CM | POA: Diagnosis not present

## 2019-04-11 DIAGNOSIS — M4800 Spinal stenosis, site unspecified: Secondary | ICD-10-CM | POA: Diagnosis not present

## 2019-04-11 DIAGNOSIS — E559 Vitamin D deficiency, unspecified: Secondary | ICD-10-CM | POA: Diagnosis not present

## 2019-04-16 DIAGNOSIS — I10 Essential (primary) hypertension: Secondary | ICD-10-CM | POA: Diagnosis not present

## 2019-04-16 DIAGNOSIS — F0280 Dementia in other diseases classified elsewhere without behavioral disturbance: Secondary | ICD-10-CM | POA: Diagnosis not present

## 2019-04-16 DIAGNOSIS — S72002D Fracture of unspecified part of neck of left femur, subsequent encounter for closed fracture with routine healing: Secondary | ICD-10-CM | POA: Diagnosis not present

## 2019-04-16 DIAGNOSIS — E039 Hypothyroidism, unspecified: Secondary | ICD-10-CM | POA: Diagnosis not present

## 2019-04-16 DIAGNOSIS — M4800 Spinal stenosis, site unspecified: Secondary | ICD-10-CM | POA: Diagnosis not present

## 2019-04-16 DIAGNOSIS — G20 Parkinson's disease: Secondary | ICD-10-CM | POA: Diagnosis not present

## 2019-04-16 DIAGNOSIS — I2510 Atherosclerotic heart disease of native coronary artery without angina pectoris: Secondary | ICD-10-CM | POA: Diagnosis not present

## 2019-04-16 DIAGNOSIS — E559 Vitamin D deficiency, unspecified: Secondary | ICD-10-CM | POA: Diagnosis not present

## 2019-04-16 DIAGNOSIS — I4891 Unspecified atrial fibrillation: Secondary | ICD-10-CM | POA: Diagnosis not present

## 2019-04-18 ENCOUNTER — Other Ambulatory Visit: Payer: Self-pay | Admitting: Cardiology

## 2019-04-26 DIAGNOSIS — H34811 Central retinal vein occlusion, right eye, with macular edema: Secondary | ICD-10-CM | POA: Diagnosis not present

## 2019-04-26 DIAGNOSIS — H3522 Other non-diabetic proliferative retinopathy, left eye: Secondary | ICD-10-CM | POA: Diagnosis not present

## 2019-04-26 DIAGNOSIS — H3582 Retinal ischemia: Secondary | ICD-10-CM | POA: Diagnosis not present

## 2019-04-26 DIAGNOSIS — H348111 Central retinal vein occlusion, right eye, with retinal neovascularization: Secondary | ICD-10-CM | POA: Diagnosis not present

## 2019-04-26 DIAGNOSIS — H353211 Exudative age-related macular degeneration, right eye, with active choroidal neovascularization: Secondary | ICD-10-CM | POA: Diagnosis not present

## 2019-06-02 ENCOUNTER — Emergency Department (HOSPITAL_BASED_OUTPATIENT_CLINIC_OR_DEPARTMENT_OTHER): Payer: Medicare HMO

## 2019-06-02 ENCOUNTER — Emergency Department (HOSPITAL_COMMUNITY)
Admission: EM | Admit: 2019-06-02 | Discharge: 2019-06-02 | Disposition: A | Discharge status: Home / Self Care | Payer: Medicare HMO | Attending: Emergency Medicine | Admitting: Emergency Medicine

## 2019-06-02 ENCOUNTER — Encounter (HOSPITAL_COMMUNITY): Payer: Self-pay | Admitting: *Deleted

## 2019-06-02 DIAGNOSIS — I442 Atrioventricular block, complete: Secondary | ICD-10-CM | POA: Diagnosis not present

## 2019-06-02 DIAGNOSIS — Z79899 Other long term (current) drug therapy: Secondary | ICD-10-CM | POA: Diagnosis not present

## 2019-06-02 DIAGNOSIS — I8001 Phlebitis and thrombophlebitis of superficial vessels of right lower extremity: Secondary | ICD-10-CM

## 2019-06-02 DIAGNOSIS — I119 Hypertensive heart disease without heart failure: Secondary | ICD-10-CM | POA: Insufficient documentation

## 2019-06-02 DIAGNOSIS — M79604 Pain in right leg: Secondary | ICD-10-CM | POA: Insufficient documentation

## 2019-06-02 DIAGNOSIS — R2241 Localized swelling, mass and lump, right lower limb: Secondary | ICD-10-CM | POA: Diagnosis present

## 2019-06-02 DIAGNOSIS — R001 Bradycardia, unspecified: Secondary | ICD-10-CM | POA: Diagnosis not present

## 2019-06-02 DIAGNOSIS — I2510 Atherosclerotic heart disease of native coronary artery without angina pectoris: Secondary | ICD-10-CM | POA: Diagnosis not present

## 2019-06-02 NOTE — ED Triage Notes (Signed)
Pt states he noticed several knots in his right posterior calf for the past week. He denies pain.

## 2019-06-02 NOTE — ED Provider Notes (Signed)
[facility] DEPT Provider Note   CSN: [id number] Arrival date & time: 06/02/19  1559     History Chief Complaint  Patient presents with  . knots on right leg    Chad Russell is a 86 y.o. male.  Patient indicates is concerned he may have blood clot in right lower leg. Indicates in past week he has felt swelling in area, and firm slightly tender areas along vein in back of lower leg. Symptoms acute onset, moderate, persistent.  Denies injury to area.  No diffuse leg swelling. No numbness or weakness. No skin changes or erythema to area. No fever or chills. Denies chest pain or sob.   The history is provided by the patient and the spouse.       Past Medical History:  Diagnosis Date  . Arthritis   . Atrial fibrillation ([place])   . Balance problem 10/31/2013  . BPH (benign prostatic hyperplasia) 2014   Urology Dr [name] with [facility] [phone]   . Bradycardia    a. baseline HR in the 30's. Asymptomatic - no history of PPM placement.   Marland Kitchen CAD (coronary artery disease)    LHC 03/16/11: Mid LAD 10-20%, proximal circumflex 10%, mid RCA 20%, EF 55%.  . Cataract   . Complete heart block ([place])   . Fracture of femoral neck, right ([place]) 04/19/2017  . GERD (gastroesophageal reflux disease)    takes occasional  zantac  . Hx of echocardiogram    Echo 02/2011: EF 65-70%, normal wall motion, MAC, mild MR, mild LAE, mild RVE  . Hypertension   . OSA (obstructive sleep apnea)    per patient went to have sleep study done about 2 years ago, bu they never F/U with him about results; but wife reports he still has periods of apnea at night   . Talar fracture    casted no surg    Patient Active Problem List   Diagnosis Date Noted  . AKI (acute kidney injury) ([place]) 12/15/2018  . Hematoma of scalp   . Heart block   . Chest pain 05/04/2018  . Fracture of femoral neck, left ([place]) 04/19/2017  . MGUS (monoclonal gammopathy of unknown significance) 01/15/2017  . BPH with  obstruction/lower urinary tract symptoms 09/07/2016  . Preoperative cardiovascular examination 08/24/2016  . Urinary retention 05/13/2016  . Balance problem 10/31/2013  . OSA (obstructive sleep apnea) 10/31/2013  . BPH (benign prostatic hyperplasia) 08/28/2012  . Atrial fibrillation ([place]) 06/08/2012  . Hyperplasia of prostate with lower urinary tract symptoms (LUTS) 04/28/2012  . Moderate malnutrition ([place]) 04/28/2012  . Infection of urinary tract - recurrent 04/26/2012  . NSTEMI (non-ST elevated myocardial infarction) ([place]) 03/15/2011  . Complete heart block ([place]) 03/15/2011  . HTN (hypertension) 03/15/2011    Past Surgical History:  Procedure Laterality Date  . ABDOMINAL AORTOGRAM W/LOWER EXTREMITY N/A 01/17/2017   Procedure: ABDOMINAL AORTOGRAM W/LOWER EXTREMITY;  Surgeon: Angelia Mould, MD;  Location: [facility];  Service: Cardiovascular;  Laterality: N/A;  . COLONOSCOPY     Repeated and normal in 2011  . FRACTURE SURGERY    . HEMIARTHROPLASTY HIP Right 04/19/2017  . HEMORROIDECTOMY    . HIP ARTHROPLASTY Right 04/19/2017   Procedure: ARTHROPLASTY BIPOLAR HIP (HEMIARTHROPLASTY);  Surgeon: Marchia Bond, MD;  Location: [facility];  Service: Orthopedics;  Laterality: Right;  . HIP ARTHROPLASTY Left 12/16/2018   Procedure: ARTHROPLASTY BIPOLAR HIP (HEMIARTHROPLASTY);  Surgeon: Altamese Ocilla, MD;  Location: [facility];  Service: Orthopedics;  Laterality:  Left;  . INGUINAL HERNIA REPAIR Right 02/26/2014   Procedure: LAPAROSCOPIC RIGHT INGUINAL HERNIA REPAIR;  Surgeon: Ralene Ok, MD;  Location: [facility];  Service: General;  Laterality: Right;  . INGUINAL HERNIA REPAIR    . INSERTION OF MESH Right 02/26/2014   Procedure: INSERTION OF MESH;  Surgeon: Ralene Ok, MD;  Location: [facility];  Service: General;  Laterality: Right;  . LEFT HEART CATHETERIZATION WITH CORONARY ANGIOGRAM N/A 03/16/2011   Procedure: LEFT HEART CATHETERIZATION WITH CORONARY ANGIOGRAM;  Surgeon: Peter M  Martinique, MD;  Location: [facility];  Service: Cardiovascular;  Laterality: N/A;  . PROSTATE BIOPSY     negative - [facility]  . TRANSURETHRAL RESECTION OF PROSTATE N/A 09/07/2016   Procedure: TRANSURETHRAL RESECTION OF THE PROSTATE (TURP);  Surgeon: Irine Seal, MD;  Location: [facility];  Service: Urology;  Laterality: N/A;       Family History  Problem Relation Age of Onset  . Hypertension Mother   . Multiple sclerosis Sister     Social History   Tobacco Use  . Smoking status: Former Smoker    Packs/day: 1.00    Years: 3.00    Pack years: 3.00    Types: Cigarettes    Quit date: 03/17/1955    Years since quitting: 64.2  . Smokeless tobacco: Former Network engineer Use Topics  . Alcohol use: Yes    Alcohol/week: 14.0 standard drinks    Types: 7 Cans of beer, 7 Standard drinks or equivalent per week    Comment: daily  . Drug use: No    Home Medications Prior to Admission medications   Medication Sig Start Date End Date Taking? Authorizing Provider  acetaminophen (TYLENOL) 500 MG tablet Take 1 tablet (500 mg total) by mouth every 8 (eight) hours. 12/26/18   Debbe Odea, MD  amLODipine (NORVASC) 10 MG tablet Take 1 tablet (10 mg total) by mouth daily. 12/26/18   Debbe Odea, MD  bisacodyl (DULCOLAX) 5 MG EC tablet Take 1 tablet (5 mg total) by mouth daily as needed for moderate constipation. 12/26/18   Debbe Odea, MD  carbidopa-levodopa (SINEMET) 25-100 MG tablet Take 1 tablet by mouth 3 (three) times daily. 06/20/18   Narda Amber K, DO  feeding supplement, ENSURE ENLIVE, (ENSURE ENLIVE) LIQD Take 237 mLs by mouth 2 (two) times daily between meals. 12/26/18   Debbe Odea, MD  lisinopril (ZESTRIL) 20 MG tablet Take 1 tablet (20 mg total) by mouth daily. Patient not taking: Reported on 02/21/2019 12/26/18   Debbe Odea, MD  lisinopril-hydrochlorothiazide (ZESTORETIC) 10-12.5 MG tablet TAKE ONE TABLET BY MOUTH DAILY 01/15/19   Minus Breeding, MD  Misc Natural Products  (PROSTATE) CAPS Take 1 tablet daily by mouth. Prostate Plus otc supplement    [provider]  Multiple Vitamin (MULTIVITAMIN WITH MINERALS) TABS tablet Take 1 tablet daily by mouth.    [provider]  Multiple Vitamins-Minerals (PRESERVISION AREDS 2+MULTI VIT PO) Take 1 capsule by mouth 2 (two) times daily.    [provider]  senna (SENOKOT) 8.6 MG TABS tablet Take 1 tablet (8.6 mg total) by mouth at bedtime as needed for mild constipation. 12/26/18   Debbe Odea, MD  timolol (TIMOPTIC) 0.5 % ophthalmic solution One drop in both eyes in the morning and one drop in the left eye at night. Patient taking differently: Place 1 drop into both eyes 2 (two) times daily.  04/22/17   Alphonzo Grieve, MD  vitamin C (ASCORBIC ACID) 500 MG tablet Take 500  mg by mouth daily.    [provider]  XARELTO 20 MG TABS tablet TAKE ONE TABLET BY MOUTH DAILY WITH SUPPER 04/18/19   Minus Breeding, MD    Allergies    Doxycycline and Keflex [cephalexin]  Review of Systems   Review of Systems  Constitutional: Negative for fever.  HENT: Negative for sore throat.   Eyes: Negative for redness.  Respiratory: Negative for shortness of breath.   Cardiovascular: Negative for chest pain.  Gastrointestinal: Negative for abdominal pain.  Genitourinary: Negative for flank pain.  Musculoskeletal: Negative for back pain.  Skin: Negative for rash.  Neurological: Negative for headaches.  Hematological: Does not bruise/bleed easily.  Psychiatric/Behavioral: Negative for confusion.    Physical Exam Updated Vital Signs BP 105/66 (BP Location: Right Arm)   Pulse (!) 35   Temp 98.4 F (36.9 C) (Oral)   Resp 16   SpO2 100%   Physical Exam Vitals and nursing note reviewed.  Constitutional:      Appearance: Normal appearance. He is well-developed.  HENT:     Head: Atraumatic.     Nose: Nose normal.     Mouth/Throat:     Mouth: Mucous membranes are moist.     Pharynx: Oropharynx is  clear.  Eyes:     General: No scleral icterus.    Conjunctiva/sclera: Conjunctivae normal.  Neck:     Trachea: No tracheal deviation.  Cardiovascular:     Rate and Rhythm: Normal rate and regular rhythm.     Pulses: Normal pulses.     Heart sounds: Normal heart sounds. No murmur. No friction rub. No gallop.   Pulmonary:     Effort: Pulmonary effort is normal. No accessory muscle usage or respiratory distress.     Breath sounds: Normal breath sounds.  Abdominal:     General: There is no distension.  Genitourinary:    Comments: No cva tenderness. Musculoskeletal:     Cervical back: Normal range of motion and neck supple. No rigidity.     Comments: No leg swelling. Patient with tiny palp cord in superficial vein posterior right calf. No erythema or cellulitis. Distal pulses palp.   Skin:    General: Skin is warm and dry.     Findings: No rash.  Neurological:     Mental Status: He is alert.     Comments: Alert, speech clear.   Psychiatric:        Mood and Affect: Mood normal.     ED Results / Procedures / Treatments   Labs (all labs ordered are listed, but only abnormal results are displayed) Labs Reviewed - No data to display  EKG EKG Interpretation  Date/Time:  Saturday June 02 2019 20:45:34 EDT Ventricular Rate:  31 PR Interval:    QRS Duration: 156 QT Interval:  593 QTC Calculation: 426 R Axis:   75 Text Interpretation: A-flutter/fibrillation w/ complete AV block Right bundle branch block Non-specific ST-t changes Confirmed by Lajean Saver [phone]) on 06/02/2019 8:50:26 PM   Radiology VAS [place] LOWER EXTREMITY VENOUS (DVT) ([facility] 7a-7p)  Result Date: 06/02/2019  Lower Venous DVTStudy Indications: Palpable Cord.  Comparison Study: Prior study from 04/28/12 is available for comparison Performing Technologist: Sharion Dove RVS  Examination Guidelines: A complete evaluation includes B-mode imaging, spectral Doppler, color Doppler, and power Doppler as needed of all  accessible portions of each vessel. Bilateral testing is considered an integral part of a complete examination. Limited examinations for reoccurring indications may be performed as noted. The  reflux portion of the exam is performed with the patient in reverse Trendelenburg.  +---------+---------------+---------+-----------+----------+--------------+ RIGHT    CompressibilityPhasicitySpontaneityPropertiesThrombus Aging +---------+---------------+---------+-----------+----------+--------------+ CFV      Full           Yes      Yes                                 +---------+---------------+---------+-----------+----------+--------------+ SFJ      Full                                                        +---------+---------------+---------+-----------+----------+--------------+ FV Prox  Full                                                        +---------+---------------+---------+-----------+----------+--------------+ FV Mid   Full                                                        +---------+---------------+---------+-----------+----------+--------------+ FV DistalFull                                                        +---------+---------------+---------+-----------+----------+--------------+ PFV      Full                                                        +---------+---------------+---------+-----------+----------+--------------+ POP      Full           Yes      Yes                                 +---------+---------------+---------+-----------+----------+--------------+ PTV      Full                                                        +---------+---------------+---------+-----------+----------+--------------+ PERO     Full                                                        +---------+---------------+---------+-----------+----------+--------------+ GSV      Full  thickened       +---------+---------------+---------+-----------+----------+--------------+ SSV      None                                         Chronic        +---------+---------------+---------+-----------+----------+--------------+   +----+---------------+---------+-----------+----------+--------------+ LEFTCompressibilityPhasicitySpontaneityPropertiesThrombus Aging +----+---------------+---------+-----------+----------+--------------+ CFV Full           Yes      Yes                                 +----+---------------+---------+-----------+----------+--------------+     Summary: RIGHT: - Findings consistent with chronic superficial vein thrombosis involving the right small saphenous vein. - Findings appear essentially unchanged compared to previous examination. - There is no evidence of deep vein thrombosis in the lower extremity.  LEFT: - No evidence of common femoral vein obstruction.  *See table(s) above for measurements and observations.    Preliminary     Procedures Procedures (including critical care time)  Medications Ordered in ED Medications - No data to display  ED Course  I have reviewed the triage vital signs and the nursing notes.  Pertinent labs & imaging results that were available during my care of the patient were reviewed by me and considered in my medical decision making (see chart for details).    MDM Rules/Calculators/A&P                      Pt/spouse very concerned with blood clot requesting u/s. Pt is noted to already be on anticoagulant therapy - no abn bruising or bleeding noted.   Diff dx includes dvt, superficial thrombophlebitis - dvt despite anticoag therapy, would involve significant morbidity.   Reviewed nursing notes and prior charts for additional history.   Vascular u/s ordered.   Vascular u/s reviewed/interpreted by me and discussed w tech - shows chronic superficial thrombophlebitis, noted on previous studies as well.  Of note, hr is 40, hx  complete heart block/afib with baseline hr in 35-45 range, and prior decision of no pacemaker or other aggressive therapy for same. No current or recent cp or discomfort. No sob or unusual doe. No faintness or dizziness.   MDM Number of Diagnoses or Management Options   Amount and/or Complexity of Data Reviewed Tests in the radiology section of CPT: ordered and reviewed Discussion of test results with the performing providers: yes Decide to obtain previous medical records or to obtain history from someone other than the patient: yes Obtain history from someone other than the patient: yes Review and summarize past medical records: yes Independent visualization of images, tracings, or specimens: yes  Risk of Complications, Morbidity, and/or Mortality Presenting problems: high Diagnostic procedures: moderate Management options: high    Patient appears stable for d/c. For pts hx and ongoing chf/brady - rec cardiology f/u.   Return precautions provided.    Final Clinical Impression(s) / ED Diagnoses Final diagnoses:  None    Rx / DC Orders ED Discharge Orders    None       Lajean Saver, MD 06/02/19 2053

## 2019-06-02 NOTE — Discharge Instructions (Addendum)
It was our pleasure to provide your ER care today - we hope that you feel better.  There is no deep vein thrombosis. Your imaging study shows chronic superficial thrombophlebitis of a superficial vein - unchanged from prior imaging studies.  For your low heart rate/heart block - follow up with your cardiologist in the coming week.   Return to ER if worse, new symptoms, chest pain, trouble breathing, weak/fainting, or other concern.

## 2019-06-02 NOTE — Progress Notes (Signed)
VASCULAR LAB PRELIMINARY  PRELIMINARY  PRELIMINARY  PRELIMINARY  Right lower extremity venous duplex completed.    Preliminary report:  See CV proc for preliminary results.  Gave report to Dr. Ashok Cordia.  Daylee Delahoz, RVT 06/02/2019, 7:59 PM

## 2019-06-04 ENCOUNTER — Telehealth: Payer: Self-pay

## 2019-06-04 NOTE — Telephone Encounter (Signed)
Called pt to follow-up on after hours message from 06/02/2019. Message was a concern about a blood clot in pt's leg. Pt went to the hospital on 06/02/2019. I called to follow-up with pt and check on status. I was informed by pt's wife that he was ok and feeling a bit better since coming home. I informed them to reach out if they need any thing.

## 2019-06-08 ENCOUNTER — Encounter: Payer: Self-pay | Admitting: Neurology

## 2019-06-11 ENCOUNTER — Telehealth (INDEPENDENT_AMBULATORY_CARE_PROVIDER_SITE_OTHER): Payer: Medicare HMO | Admitting: Neurology

## 2019-06-11 ENCOUNTER — Other Ambulatory Visit: Payer: Self-pay

## 2019-06-11 DIAGNOSIS — G20 Parkinson's disease: Secondary | ICD-10-CM

## 2019-06-11 MED ORDER — CARBIDOPA-LEVODOPA 25-100 MG PO TABS: 1.5000 | ORAL_TABLET | Freq: Two times a day (BID) | ORAL | 3 refills | Status: AC

## 2019-06-11 NOTE — Progress Notes (Signed)
    Virtual Visit via Telephone Note The purpose of this virtual visit is to provide medical care while limiting exposure to the novel coronavirus.    Consent was obtained for phone visit:  Yes.   Answered questions that patient had about telehealth interaction:  Yes.   I discussed the limitations, risks, security and privacy concerns of performing an evaluation and management service by telephone. I also discussed with the patient that there may be a patient responsible charge related to this service. The patient expressed understanding and agreed to proceed.  Pt location: Home Physician Location: office Name of referring provider:  [name], MD I connected with [name] at patients initiation/request on 06/11/2019 at  2:30 PM EDT by telephone and verified that I am speaking with the correct person using two identifiers.  Pt MRN:  [id number] Pt DOB:  [birth year]   History of Present Illness: This is a 86 year old man returns for follow-up of neuropathy and Parkinson's disease. I called patient at his appointment time, but he was in the car and requested that I call back later.  There was no answer when I tried calling back later in the day.  Message left on answering machine to reschedule appointment.  Assessment and Plan:   1. Parkinson disease, stable  - Refills sent for sinemet to 1.5 twice daily - take at 9am and 2pm   2. Peripheral neuropathy contributed by peripheral arterial disease, stable.   Follow Up Instructions:   I discussed the assessment and treatment plan with the patient. The patient was provided an opportunity to ask questions and all were answered. The patient agreed with the plan and demonstrated an understanding of the instructions.   The patient was advised to call back or seek an in-person evaluation if the symptoms worsen or if the condition fails to improve as anticipated.   Alda Berthold, DO

## 2019-06-20 DIAGNOSIS — H3521 Other non-diabetic proliferative retinopathy, right eye: Secondary | ICD-10-CM | POA: Insufficient documentation

## 2019-06-20 DIAGNOSIS — H3561 Retinal hemorrhage, right eye: Secondary | ICD-10-CM | POA: Insufficient documentation

## 2019-06-20 DIAGNOSIS — H34811 Central retinal vein occlusion, right eye, with macular edema: Secondary | ICD-10-CM | POA: Insufficient documentation

## 2019-06-20 DIAGNOSIS — H3522 Other non-diabetic proliferative retinopathy, left eye: Secondary | ICD-10-CM | POA: Insufficient documentation

## 2019-06-20 DIAGNOSIS — H348111 Central retinal vein occlusion, right eye, with retinal neovascularization: Secondary | ICD-10-CM | POA: Insufficient documentation

## 2019-06-20 DIAGNOSIS — H3562 Retinal hemorrhage, left eye: Secondary | ICD-10-CM | POA: Insufficient documentation

## 2019-06-20 DIAGNOSIS — H3582 Retinal ischemia: Secondary | ICD-10-CM | POA: Insufficient documentation

## 2019-06-21 ENCOUNTER — Encounter (INDEPENDENT_AMBULATORY_CARE_PROVIDER_SITE_OTHER): Payer: Medicare HMO | Admitting: Ophthalmology

## 2019-06-21 ENCOUNTER — Other Ambulatory Visit: Payer: Self-pay

## 2019-06-21 ENCOUNTER — Ambulatory Visit (INDEPENDENT_AMBULATORY_CARE_PROVIDER_SITE_OTHER): Payer: Medicare HMO | Admitting: Ophthalmology

## 2019-06-21 ENCOUNTER — Encounter (INDEPENDENT_AMBULATORY_CARE_PROVIDER_SITE_OTHER): Payer: Self-pay | Admitting: Ophthalmology

## 2019-06-21 DIAGNOSIS — H3561 Retinal hemorrhage, right eye: Secondary | ICD-10-CM

## 2019-06-21 DIAGNOSIS — H3522 Other non-diabetic proliferative retinopathy, left eye: Secondary | ICD-10-CM

## 2019-06-21 DIAGNOSIS — H34811 Central retinal vein occlusion, right eye, with macular edema: Secondary | ICD-10-CM

## 2019-06-21 DIAGNOSIS — H3521 Other non-diabetic proliferative retinopathy, right eye: Secondary | ICD-10-CM | POA: Diagnosis not present

## 2019-06-21 DIAGNOSIS — H3562 Retinal hemorrhage, left eye: Secondary | ICD-10-CM

## 2019-06-21 DIAGNOSIS — H348111 Central retinal vein occlusion, right eye, with retinal neovascularization: Secondary | ICD-10-CM | POA: Diagnosis not present

## 2019-06-21 DIAGNOSIS — H3582 Retinal ischemia: Secondary | ICD-10-CM

## 2019-06-21 MED ORDER — AFLIBERCEPT 2MG/0.05ML IZ SOLN FOR KALEIDOSCOPE
2.0000 mg | INTRAVITREAL | Status: AC | PRN
  Administered 2019-06-21: 2 mg via INTRAVITREAL

## 2019-06-21 NOTE — Assessment & Plan Note (Signed)
The nature of central retinal vein occlusion was discussed with the patient including the division of types into nonischemic ischemic. The potential sequelae of ischemic central retinal vein occlusion, including macular edema, neovascularization, rubeosis iridis, and neovascular glaucoma, were discussed, and the need for frequent follow-up. ° °The nature of macular edema and central retinal vein occlusion was discussed. The following options were considered: ° °1.Observation for a period to look for spontaneous improvement, is no linger the primary therapy. One-third worsen, one-third stay unchanged, and one-third improves. ° °2. Anti-VEGF Therapy. ( Lucentis, Avastin or Eylea ) injected  in intravitreal fashion, initially monthly then tailored to clinical response. ° °3. Intravitreal steroid usage, Kenalog, or Ozurdex, usually a second line therapy or in combination with anti-Vegf therapy noted above. ° °4. Panretinal laser photocoagulation to cause regression of iris neovascularization, or treat retinal  non-perfusion. ° °5. Surgical Management may include vitrectomy with incisions of peripheral veins to trigger retino choroidal anastomosis formation. This topic presented and discussed at [facility] 2011. °MUCH LESS CME FROM CENTRAL RETINAL VEIN OCCLUSION,  ON ANTIVEGF THERAPY.  WILL ADJUST TREATMENT INTERVAL TO IMPROVE AND MAINTAIN THIS FINDING, WITH THE GOAL OF ENDING THERAPY SHOULD VASCULAR STABILITY BE MAINTAINED. °

## 2019-06-21 NOTE — Progress Notes (Signed)
06/21/2019     CHIEF COMPLAINT Patient presents for Retina Follow Up   HISTORY OF PRESENT ILLNESS: Chad Russell is a 86 y.o. male who presents to the clinic today for:   HPI    Retina Follow Up    Patient presents with  CRVO/BRVO.  In right eye.  Severity is moderate.  Duration of 8 weeks.  Since onset it is stable.  I, the attending physician,  performed the HPI with the patient and updated documentation appropriately.          Comments    8 Week CRVO with massive cystoid macular edema.  F\u OD. Possible Eylea OD. OCT  Pt states OD vision is not doing well. States OS is stable.       Last edited by Hurman Horn, MD on 06/21/2019 10:50 AM. (History)      Referring physician: [name], MD [address] [place],  [place] [address]  HISTORICAL INFORMATION:   Selected notes from the MEDICAL RECORD NUMBER       CURRENT MEDICATIONS: Current Outpatient Medications (Ophthalmic Drugs)  Medication Sig  . timolol (TIMOPTIC) 0.5 % ophthalmic solution One drop in both eyes in the morning and one drop in the left eye at night. (Patient taking differently: Place 1 drop into both eyes 2 (two) times daily. )   No current facility-administered medications for this visit. (Ophthalmic Drugs)   Current Outpatient Medications (Other)  Medication Sig  . acetaminophen (TYLENOL) 500 MG tablet Take 1 tablet (500 mg total) by mouth every 8 (eight) hours.  Marland Kitchen amLODipine (NORVASC) 10 MG tablet Take 1 tablet (10 mg total) by mouth daily.  . bisacodyl (DULCOLAX) 5 MG EC tablet Take 1 tablet (5 mg total) by mouth daily as needed for moderate constipation.  . carbidopa-levodopa (SINEMET) 25-100 MG tablet Take 1.5 tablets by mouth in the morning and at bedtime.  . feeding supplement, ENSURE ENLIVE, (ENSURE ENLIVE) LIQD Take 237 mLs by mouth 2 (two) times daily between meals.  Marland Kitchen lisinopril (ZESTRIL) 20 MG tablet Take 1 tablet (20 mg total) by mouth daily. (Patient not taking: Reported on  02/21/2019)  . lisinopril-hydrochlorothiazide (ZESTORETIC) 10-12.5 MG tablet TAKE ONE TABLET BY MOUTH DAILY  . Misc Natural Products (PROSTATE) CAPS Take 1 tablet daily by mouth. Prostate Plus otc supplement  . Multiple Vitamin (MULTIVITAMIN WITH MINERALS) TABS tablet Take 1 tablet daily by mouth.  . Multiple Vitamins-Minerals (PRESERVISION AREDS 2+MULTI VIT PO) Take 1 capsule by mouth 2 (two) times daily.  Marland Kitchen senna (SENOKOT) 8.6 MG TABS tablet Take 1 tablet (8.6 mg total) by mouth at bedtime as needed for mild constipation.  . vitamin C (ASCORBIC ACID) 500 MG tablet Take 500 mg by mouth daily.  Alveda Reasons 20 MG TABS tablet TAKE ONE TABLET BY MOUTH DAILY WITH SUPPER   No current facility-administered medications for this visit. (Other)      REVIEW OF SYSTEMS:    ALLERGIES Allergies  Allergen Reactions  . Doxycycline Other (See Comments)    Upset stomach  . Keflex [Cephalexin] Other (See Comments)    Abdominal discomfort 04-19-17 Pt has tolerated orally    PAST MEDICAL HISTORY Past Medical History:  Diagnosis Date  . Arthritis   . Atrial fibrillation ([place])   . Balance problem 10/31/2013  . BPH (benign prostatic hyperplasia) 2014   Urology Dr [name] with [facility] [phone]   . Bradycardia    a. baseline HR in the 30's. Asymptomatic - no history  of PPM placement.   Marland Kitchen CAD (coronary artery disease)    LHC 03/16/11: Mid LAD 10-20%, proximal circumflex 10%, mid RCA 20%, EF 55%.  . Cataract   . Complete heart block ([place])   . Fracture of femoral neck, right ([place]) 04/19/2017  . GERD (gastroesophageal reflux disease)    takes occasional  zantac  . Hx of echocardiogram    Echo 02/2011: EF 65-70%, normal wall motion, MAC, mild MR, mild LAE, mild RVE  . Hypertension   . OSA (obstructive sleep apnea)    per patient went to have sleep study done about 2 years ago, bu they never F/U with him about results; but wife reports he still has periods of apnea at night   . Talar fracture    casted no  surg   Past Surgical History:  Procedure Laterality Date  . ABDOMINAL AORTOGRAM W/LOWER EXTREMITY N/A 01/17/2017   Procedure: ABDOMINAL AORTOGRAM W/LOWER EXTREMITY;  Surgeon: Angelia Mould, MD;  Location: [facility];  Service: Cardiovascular;  Laterality: N/A;  . COLONOSCOPY     Repeated and normal in 2011  . FRACTURE SURGERY    . HEMIARTHROPLASTY HIP Right 04/19/2017  . HEMORROIDECTOMY    . HIP ARTHROPLASTY Right 04/19/2017   Procedure: ARTHROPLASTY BIPOLAR HIP (HEMIARTHROPLASTY);  Surgeon: Marchia Bond, MD;  Location: [facility];  Service: Orthopedics;  Laterality: Right;  . HIP ARTHROPLASTY Left 12/16/2018   Procedure: ARTHROPLASTY BIPOLAR HIP (HEMIARTHROPLASTY);  Surgeon: Altamese Greers Ferry, MD;  Location: [facility];  Service: Orthopedics;  Laterality: Left;  . INGUINAL HERNIA REPAIR Right 02/26/2014   Procedure: LAPAROSCOPIC RIGHT INGUINAL HERNIA REPAIR;  Surgeon: Ralene Ok, MD;  Location: [facility];  Service: General;  Laterality: Right;  . INGUINAL HERNIA REPAIR    . INSERTION OF MESH Right 02/26/2014   Procedure: INSERTION OF MESH;  Surgeon: Ralene Ok, MD;  Location: [facility];  Service: General;  Laterality: Right;  . LEFT HEART CATHETERIZATION WITH CORONARY ANGIOGRAM N/A 03/16/2011   Procedure: LEFT HEART CATHETERIZATION WITH CORONARY ANGIOGRAM;  Surgeon: Peter M Martinique, MD;  Location: [facility] CATH LAB;  Service: Cardiovascular;  Laterality: N/A;  . PROSTATE BIOPSY     negative - [facility]  . TRANSURETHRAL RESECTION OF PROSTATE N/A 09/07/2016   Procedure: TRANSURETHRAL RESECTION OF THE PROSTATE (TURP);  Surgeon: Irine Seal, MD;  Location: [facility];  Service: Urology;  Laterality: N/A;    FAMILY HISTORY Family History  Problem Relation Age of Onset  . Hypertension Mother   . Multiple sclerosis Sister     SOCIAL HISTORY Social History   Tobacco Use  . Smoking status: Former Smoker    Packs/day: 1.00    Years: 3.00    Pack years: 3.00    Types: Cigarettes     Quit date: 03/17/1955    Years since quitting: 64.3  . Smokeless tobacco: Former Network engineer Use Topics  . Alcohol use: Yes    Alcohol/week: 14.0 standard drinks    Types: 7 Cans of beer, 7 Standard drinks or equivalent per week    Comment: daily  . Drug use: No         OPHTHALMIC EXAM:  Base Eye Exam    Visual Acuity (Snellen - Linear)      Right Left   Dist cc E Card @ 5'  20/60   Dist ph cc NI NI       Tonometry (Tonopen, 9:49 AM)      Right Left   Pressure 14 24  Pupils      Pupils Dark Light Shape React APD   Right PERRL 4 3 Round Brisk None   Left PERRL 4 3 Round Brisk +1       Visual Fields      Left Right   Restrictions Total superior temporal, inferior temporal deficiencies Total superior nasal, inferior nasal deficiencies       Neuro/Psych    Oriented x3: Yes   Mood/Affect: Normal       Dilation    Right eye: 1.0% Mydriacyl, 2.5% Phenylephrine @ 9:49 AM        Slit Lamp and Fundus Exam    External Exam      Right Left   External Normal Normal       Slit Lamp Exam      Right Left   Lids/Lashes Normal Normal   Conjunctiva/Sclera White and quiet White and quiet   Cornea Clear Clear   Anterior Chamber Deep and quiet Deep and quiet   Iris Round and reactive Round and reactive   Lens Posterior chamber intraocular lens, Open posterior capsule Posterior chamber intraocular lens, Open posterior capsule   Anterior Vitreous Normal Normal       Fundus Exam      Right Left   Posterior Vitreous Normal    C/D Ratio 0.5    Macula Macular thickening, Cystoid macular edema, Microaneurysms, Atrophy, Retinal pigment epithelial atrophy, Drusen, Soft drusen    Vessels Old central retinal vein occlusion    Periphery Normal           IMAGING AND PROCEDURES  Imaging and Procedures for 06/21/19  OCT, Retina - OU - Both Eyes       Right Eye Quality was good. Progression has improved. Findings include abnormal foveal contour.   Left  Eye Quality was good. Scan locations included subfoveal. Findings include intraretinal hyper-reflective material, abnormal foveal contour.   Notes OD much less cystoid macular edema of the right eye after Eylea at 6-week interval, repeat Eylea today  OS with chronic cystoid macular edema outer retinal scarring       Intravitreal Injection, Pharmacologic Agent - OD - Right Eye       Time Out 06/21/2019. 10:52 AM. Confirmed correct patient, procedure, site, and patient consented.   Anesthesia Topical anesthesia was used. Anesthetic medications included Akten 3.5%.   Procedure Preparation included 10% betadine to eyelids. A 30 gauge needle was used.   Injection:  2 mg aflibercept Alfonse Flavors) SOLN   NDC: [id number], Lot: [id number]   Route: Intravitreal, Site: Right Eye, Waste: 0 mg  Post-op Post injection exam found visual acuity of at least counting fingers. The patient tolerated the procedure well. There were no complications. The patient received written and verbal post procedure care education. Post injection medications were not given.                 ASSESSMENT/PLAN:  Central retinal vein occlusion with macular edema of right eye The nature of central retinal vein occlusion was discussed with the patient including the division of types into nonischemic ischemic. The potential sequelae of ischemic central retinal vein occlusion, including macular edema, neovascularization, rubeosis iridis, and neovascular glaucoma, were discussed, and the need for frequent follow-up.  The nature of macular edema and central retinal vein occlusion was discussed. The following options were considered:  1.Observation for a period to look for spontaneous improvement, is no linger the primary therapy. One-third worsen, one-third stay unchanged, and one-third improves.  2.  Anti-VEGF Therapy. ( Lucentis, Avastin or Eylea ) injected  in intravitreal fashion, initially monthly then tailored to  clinical response.  3. Intravitreal steroid usage, Kenalog, or Ozurdex, usually a second line therapy or in combination with anti-Vegf therapy noted above.  4. Panretinal laser photocoagulation to cause regression of iris neovascularization, or treat retinal  non-perfusion.  5. Surgical Management may include vitrectomy with incisions of peripheral veins to trigger retino choroidal anastomosis formation. This topic presented and discussed at [place].  MUCH LESS CME FROM CENTRAL RETINAL VEIN OCCLUSION,  ON ANTIVEGF THERAPY.  WILL ADJUST TREATMENT INTERVAL TO IMPROVE AND MAINTAIN THIS FINDING, WITH THE GOAL OF ENDING THERAPY SHOULD VASCULAR STABILITY BE MAINTAINED.      ICD-10-CM   1. Central retinal vein occlusion with macular edema of right eye  H34.8110 OCT, Retina - OU - Both Eyes    Intravitreal Injection, Pharmacologic Agent - OD - Right Eye    aflibercept (EYLEA) SOLN 2 mg  2. Central retinal vein occlusion with neovascularization of right eye  H34.8111 OCT, Retina - OU - Both Eyes  3. Nondiabetic proliferative retinopathy, left  H35.22   4. Ocular ischemic syndrome  H35.82   5. Nondiabetic proliferative retinopathy, right  H35.21   6. Retinal hemorrhage of left eye  H35.62   7. Retinal hemorrhage of right eye  H35.61     1.  OD with ischemic central retinal vein occlusion and massive cystoid macular edema right eye secondarily.  Improved after 6-week interval of Eylea injection OD.  Will repeat today and exam in 6 weeks  2.  3.  Ophthalmic Meds Ordered this visit:  Meds ordered this encounter  Medications  . aflibercept (EYLEA) SOLN 2 mg       Return in about 6 weeks (around 08/02/2019) for EYLEA OCT, OD.  There are no Patient Instructions on file for this visit.   Explained the diagnoses, plan, and follow up with the patient and they expressed understanding.  Patient expressed understanding of the importance of proper follow up care.   Clent Demark Monique Gift M.D. Diseases  & Surgery of the Retina and Vitreous [facility] 06/21/19     Abbreviations: M myopia (nearsighted); A astigmatism; H hyperopia (farsighted); P presbyopia; Mrx spectacle prescription;  CTL contact lenses; OD right eye; OS left eye; OU both eyes  XT exotropia; ET esotropia; PEK punctate epithelial keratitis; PEE punctate epithelial erosions; DES dry eye syndrome; MGD meibomian gland dysfunction; ATs artificial tears; PFAT's preservative free artificial tears; [place] nuclear sclerotic cataract; PSC posterior subcapsular cataract; ERM epi-retinal membrane; PVD posterior vitreous detachment; RD retinal detachment; DM diabetes mellitus; DR diabetic retinopathy; NPDR non-proliferative diabetic retinopathy; PDR proliferative diabetic retinopathy; CSME clinically significant macular edema; DME diabetic macular edema; dbh dot blot hemorrhages; CWS cotton wool spot; POAG primary open angle glaucoma; C/D cup-to-disc ratio; HVF humphrey visual field; GVF goldmann visual field; OCT optical coherence tomography; IOP intraocular pressure; BRVO Branch retinal vein occlusion; CRVO central retinal vein occlusion; CRAO central retinal artery occlusion; BRAO branch retinal artery occlusion; RT retinal tear; SB scleral buckle; PPV pars plana vitrectomy; VH Vitreous hemorrhage; PRP panretinal laser photocoagulation; IVK intravitreal kenalog; VMT vitreomacular traction; MH Macular hole;  NVD neovascularization of the disc; NVE neovascularization elsewhere; AREDS age related eye disease study; ARMD age related macular degeneration; POAG primary open angle glaucoma; EBMD epithelial/anterior basement membrane dystrophy; ACIOL anterior chamber intraocular lens; IOL intraocular lens; PCIOL posterior chamber intraocular lens; Phaco/IOL phacoemulsification with intraocular lens placement; [place]  photorefractive keratectomy; LASIK laser assisted in situ keratomileusis; HTN hypertension; DM diabetes mellitus; COPD chronic  obstructive pulmonary disease

## 2019-07-04 DIAGNOSIS — S72002D Fracture of unspecified part of neck of left femur, subsequent encounter for closed fracture with routine healing: Secondary | ICD-10-CM | POA: Diagnosis not present

## 2019-08-09 ENCOUNTER — Other Ambulatory Visit: Payer: Self-pay

## 2019-08-09 ENCOUNTER — Encounter (INDEPENDENT_AMBULATORY_CARE_PROVIDER_SITE_OTHER): Payer: Self-pay | Admitting: Ophthalmology

## 2019-08-09 ENCOUNTER — Ambulatory Visit (INDEPENDENT_AMBULATORY_CARE_PROVIDER_SITE_OTHER): Payer: Medicare HMO | Admitting: Ophthalmology

## 2019-08-09 DIAGNOSIS — H34811 Central retinal vein occlusion, right eye, with macular edema: Secondary | ICD-10-CM

## 2019-08-09 MED ORDER — AFLIBERCEPT 2MG/0.05ML IZ SOLN FOR KALEIDOSCOPE
2.0000 mg | INTRAVITREAL | Status: AC | PRN
  Administered 2019-08-09: 2 mg via INTRAVITREAL

## 2019-08-09 NOTE — Assessment & Plan Note (Signed)
Cystoid macular edema the right eye secondary to central retinal vein occlusion now slowly improving on intravitreal Eylea.

## 2019-08-09 NOTE — Progress Notes (Signed)
08/09/2019     CHIEF COMPLAINT Patient presents for Retina Follow Up   HISTORY OF PRESENT ILLNESS: Chad Russell is a 86 y.o. male who presents to the clinic today for:   HPI    Retina Follow Up    Patient presents with  CRVO/BRVO.  In right eye.  Duration of 7 weeks.  Since onset it is stable.          Comments    7 week follow up - OCT OU, Poss Eylea OD Patient denies change in vision and overall has no complaints.        Last edited by Gerda Diss on 08/09/2019 10:44 AM. (History)      Referring physician: [name], MD [address] [place],  [place] [address]  HISTORICAL INFORMATION:   Selected notes from the MEDICAL RECORD NUMBER       CURRENT MEDICATIONS: Current Outpatient Medications (Ophthalmic Drugs)  Medication Sig   timolol (TIMOPTIC) 0.5 % ophthalmic solution One drop in both eyes in the morning and one drop in the left eye at night. (Patient taking differently: Place 1 drop into both eyes 2 (two) times daily. )   No current facility-administered medications for this visit. (Ophthalmic Drugs)   Current Outpatient Medications (Other)  Medication Sig   acetaminophen (TYLENOL) 500 MG tablet Take 1 tablet (500 mg total) by mouth every 8 (eight) hours.   amLODipine (NORVASC) 10 MG tablet Take 1 tablet (10 mg total) by mouth daily.   bisacodyl (DULCOLAX) 5 MG EC tablet Take 1 tablet (5 mg total) by mouth daily as needed for moderate constipation.   carbidopa-levodopa (SINEMET) 25-100 MG tablet Take 1.5 tablets by mouth in the morning and at bedtime.   feeding supplement, ENSURE ENLIVE, (ENSURE ENLIVE) LIQD Take 237 mLs by mouth 2 (two) times daily between meals.   lisinopril (ZESTRIL) 20 MG tablet Take 1 tablet (20 mg total) by mouth daily. (Patient not taking: Reported on 02/21/2019)   lisinopril-hydrochlorothiazide (ZESTORETIC) 10-12.5 MG tablet TAKE ONE TABLET BY MOUTH DAILY   Misc Natural Products (PROSTATE) CAPS Take 1 tablet  daily by mouth. Prostate Plus otc supplement   Multiple Vitamin (MULTIVITAMIN WITH MINERALS) TABS tablet Take 1 tablet daily by mouth.   Multiple Vitamins-Minerals (PRESERVISION AREDS 2+MULTI VIT PO) Take 1 capsule by mouth 2 (two) times daily.   senna (SENOKOT) 8.6 MG TABS tablet Take 1 tablet (8.6 mg total) by mouth at bedtime as needed for mild constipation.   vitamin C (ASCORBIC ACID) 500 MG tablet Take 500 mg by mouth daily.   XARELTO 20 MG TABS tablet TAKE ONE TABLET BY MOUTH DAILY WITH SUPPER   No current facility-administered medications for this visit. (Other)      REVIEW OF SYSTEMS:    ALLERGIES Allergies  Allergen Reactions   Doxycycline Other (See Comments)    Upset stomach   Keflex [Cephalexin] Other (See Comments)    Abdominal discomfort 04-19-17 Pt has tolerated orally    PAST MEDICAL HISTORY Past Medical History:  Diagnosis Date   Arthritis    Atrial fibrillation ([facility])    Balance problem 10/31/2013   BPH (benign prostatic hyperplasia) 2014   Urology Dr [name] with [facility] [phone]    Bradycardia    a. baseline HR in the 30's. Asymptomatic - no history of PPM placement.    CAD (coronary artery disease)    LHC 03/16/11: Mid LAD 10-20%, proximal circumflex 10%, mid RCA 20%, EF 55%.   Cataract  Complete heart block (HCC)    Fracture of femoral neck, right (HCC) 04/19/2017   GERD (gastroesophageal reflux disease)    takes occasional  zantac   Hx of echocardiogram    Echo 02/2011: EF 65-70%, normal wall motion, MAC, mild MR, mild LAE, mild RVE   Hypertension    OSA (obstructive sleep apnea)    per patient went to have sleep study done about 2 years ago, bu they never F/U with him about results; but wife reports he still has periods of apnea at night    Talar fracture    casted no surg   Past Surgical History:  Procedure Laterality Date   ABDOMINAL AORTOGRAM W/LOWER EXTREMITY N/A 01/17/2017   Procedure: ABDOMINAL AORTOGRAM W/LOWER  EXTREMITY;  Surgeon: Angelia Mould, MD;  Location: [facility];  Service: Cardiovascular;  Laterality: N/A;   COLONOSCOPY     Repeated and normal in 2011   FRACTURE SURGERY     HEMIARTHROPLASTY HIP Right 04/19/2017   HEMORROIDECTOMY     HIP ARTHROPLASTY Right 04/19/2017   Procedure: ARTHROPLASTY BIPOLAR HIP (HEMIARTHROPLASTY);  Surgeon: Marchia Bond, MD;  Location: [facility];  Service: Orthopedics;  Laterality: Right;   HIP ARTHROPLASTY Left 12/16/2018   Procedure: ARTHROPLASTY BIPOLAR HIP (HEMIARTHROPLASTY);  Surgeon: Altamese La Pine, MD;  Location: [facility];  Service: Orthopedics;  Laterality: Left;   INGUINAL HERNIA REPAIR Right 02/26/2014   Procedure: LAPAROSCOPIC RIGHT INGUINAL HERNIA REPAIR;  Surgeon: Ralene Ok, MD;  Location: [facility];  Service: General;  Laterality: Right;   INGUINAL HERNIA REPAIR     INSERTION OF MESH Right 02/26/2014   Procedure: INSERTION OF MESH;  Surgeon: Ralene Ok, MD;  Location: [facility];  Service: General;  Laterality: Right;   LEFT HEART CATHETERIZATION WITH CORONARY ANGIOGRAM N/A 03/16/2011   Procedure: LEFT HEART CATHETERIZATION WITH CORONARY ANGIOGRAM;  Surgeon: Peter M Martinique, MD;  Location: [facility];  Service: Cardiovascular;  Laterality: N/A;   PROSTATE BIOPSY     negative - [facility]   TRANSURETHRAL RESECTION OF PROSTATE N/A 09/07/2016   Procedure: TRANSURETHRAL RESECTION OF THE PROSTATE (TURP);  Surgeon: Irine Seal, MD;  Location: [facility];  Service: Urology;  Laterality: N/A;    FAMILY HISTORY Family History  Problem Relation Age of Onset   Hypertension Mother    Multiple sclerosis Sister     SOCIAL HISTORY Social History   Tobacco Use   Smoking status: Former Smoker    Packs/day: 1.00    Years: 3.00    Pack years: 3.00    Types: Cigarettes    Quit date: 03/17/1955    Years since quitting: 64.4   Smokeless tobacco: Former Counsellor Use: Never used  Substance Use Topics   Alcohol  use: Yes    Alcohol/week: 14.0 standard drinks    Types: 7 Cans of beer, 7 Standard drinks or equivalent per week    Comment: daily   Drug use: No         OPHTHALMIC EXAM:  Base Eye Exam    Visual Acuity (Snellen - Linear)      Right Left   Dist cc CF @ 4' 20/80+2   Dist ph cc NI NI   Correction: Glasses       Tonometry (Tonopen, 10:54 AM)      Right Left   Pressure 16 21       Pupils      Pupils Dark Light Shape React APD   Right  PERRL 4 3 Round Brisk None   Left PERRL 4 3 Round Brisk None       Visual Fields (Counting fingers)      Left Right   Restrictions Total superior temporal, inferior temporal deficiencies Total superior nasal, inferior nasal deficiencies       Extraocular Movement      Right Left    Full Full       Neuro/Psych    Oriented x3: Yes   Mood/Affect: Normal       Dilation    Both eyes: 1.0% Mydriacyl, 2.5% Phenylephrine @ 10:54 AM        Slit Lamp and Fundus Exam    External Exam      Right Left   External Normal Normal       Slit Lamp Exam      Right Left   Lids/Lashes Normal Normal   Conjunctiva/Sclera White and quiet White and quiet   Cornea Clear Clear   Anterior Chamber Deep and quiet Deep and quiet   Iris Round and reactive Round and reactive   Lens Posterior chamber intraocular lens Posterior chamber intraocular lens   Anterior Vitreous Normal Normal       Fundus Exam      Right Left   Posterior Vitreous Normal    Disc Normal    C/D Ratio 0.15    Macula Macular thickening, Cystoid macular edema, Microaneurysms, Atrophy, Retinal pigment epithelial atrophy, Drusen, Soft drusen    Vessels Old central retinal vein occlusion    Periphery Normal           IMAGING AND PROCEDURES  Imaging and Procedures for 08/09/19  OCT, Retina - OU - Both Eyes       Right Eye Quality was good. Scan locations included subfoveal. Central Foveal Thickness: 884. Progression has improved. Findings include cystoid macular edema.     Left Eye Scan locations included subfoveal. Central Foveal Thickness: 334. Progression has been stable. Findings include cystoid macular edema.   Notes Old central retinal vein occlusion of the right eye, resistant to all previous treatments.  Now slightly improving on intravitreal Eylea, at 7-week interval.  We will repeat today.       Intravitreal Injection, Pharmacologic Agent - OD - Right Eye       Time Out 08/09/2019. 11:35 AM. Confirmed correct patient, procedure, site, and patient consented.   Anesthesia Topical anesthesia was used. Anesthetic medications included Akten 3.5%.   Procedure Preparation included Ofloxacin , 10% betadine to eyelids. A 30 gauge needle was used.   Injection:  2 mg aflibercept Alfonse Flavors) SOLN   NDC: [id number], Lot: [id number]   Route: Intravitreal, Site: Right Eye, Waste: 0 mg  Post-op Post injection exam found visual acuity of at least counting fingers. The patient tolerated the procedure well. There were no complications. The patient received written and verbal post procedure care education. Post injection medications were not given.                 ASSESSMENT/PLAN:  Central retinal vein occlusion with macular edema of right eye Cystoid macular edema the right eye secondary to central retinal vein occlusion now slowly improving on intravitreal Eylea.      ICD-10-CM   1. Central retinal vein occlusion with macular edema of right eye  H34.8110 OCT, Retina - OU - Both Eyes    Intravitreal Injection, Pharmacologic Agent - OD - Right Eye    aflibercept (EYLEA) SOLN 2 mg    1.  2.  3.  Ophthalmic Meds Ordered this visit:  Meds ordered this encounter  Medications   aflibercept (EYLEA) SOLN 2 mg       Return in about 7 weeks (around 09/27/2019) for EYLEA OCT, OD.  There are no Patient Instructions on file for this visit.   Explained the diagnoses, plan, and follow up with the patient and they expressed understanding.   Patient expressed understanding of the importance of proper follow up care.   Clent Demark Emalee Knies M.D. Diseases & Surgery of the Retina and Vitreous [facility] [place] 08/09/19     Abbreviations: M myopia (nearsighted); A astigmatism; H hyperopia (farsighted); P presbyopia; Mrx spectacle prescription;  CTL contact lenses; OD right eye; OS left eye; OU both eyes  XT exotropia; ET esotropia; PEK punctate epithelial keratitis; PEE punctate epithelial erosions; DES dry eye syndrome; MGD meibomian gland dysfunction; ATs artificial tears; PFAT's preservative free artificial tears; [place] nuclear sclerotic cataract; PSC posterior subcapsular cataract; ERM epi-retinal membrane; PVD posterior vitreous detachment; RD retinal detachment; DM diabetes mellitus; DR diabetic retinopathy; NPDR non-proliferative diabetic retinopathy; PDR proliferative diabetic retinopathy; CSME clinically significant macular edema; DME diabetic macular edema; dbh dot blot hemorrhages; CWS cotton wool spot; POAG primary open angle glaucoma; C/D cup-to-disc ratio; HVF humphrey visual field; GVF goldmann visual field; OCT optical coherence tomography; IOP intraocular pressure; BRVO Branch retinal vein occlusion; CRVO central retinal vein occlusion; CRAO central retinal artery occlusion; BRAO branch retinal artery occlusion; RT retinal tear; SB scleral buckle; PPV pars plana vitrectomy; VH Vitreous hemorrhage; PRP panretinal laser photocoagulation; IVK intravitreal kenalog; VMT vitreomacular traction; MH Macular hole;  NVD neovascularization of the disc; NVE neovascularization elsewhere; AREDS age related eye disease study; ARMD age related macular degeneration; POAG primary open angle glaucoma; EBMD epithelial/anterior basement membrane dystrophy; ACIOL anterior chamber intraocular lens; IOL intraocular lens; PCIOL posterior chamber intraocular lens; Phaco/IOL phacoemulsification with intraocular lens placement; Jones photorefractive  keratectomy; LASIK laser assisted in situ keratomileusis; HTN hypertension; DM diabetes mellitus; COPD chronic obstructive pulmonary disease

## 2019-09-07 ENCOUNTER — Other Ambulatory Visit (INDEPENDENT_AMBULATORY_CARE_PROVIDER_SITE_OTHER): Payer: Self-pay | Admitting: Ophthalmology

## 2019-09-07 MED ORDER — TIMOLOL MALEATE 0.5 % OP SOLN: 1.0000 [drp] | Freq: Two times a day (BID) | OPHTHALMIC | 0 refills | Status: DC

## 2019-09-13 IMAGING — DX DG SI JOINTS 3+V
2 series · 2 of 2 positions shown · non-contrast
Comparison: Lumbar spine radiographs December 30, 2016

CLINICAL DATA: Pain

EXAM:
BILATERAL SACROILIAC JOINTS - 2 VIEW

[si joint (1 of 2)]
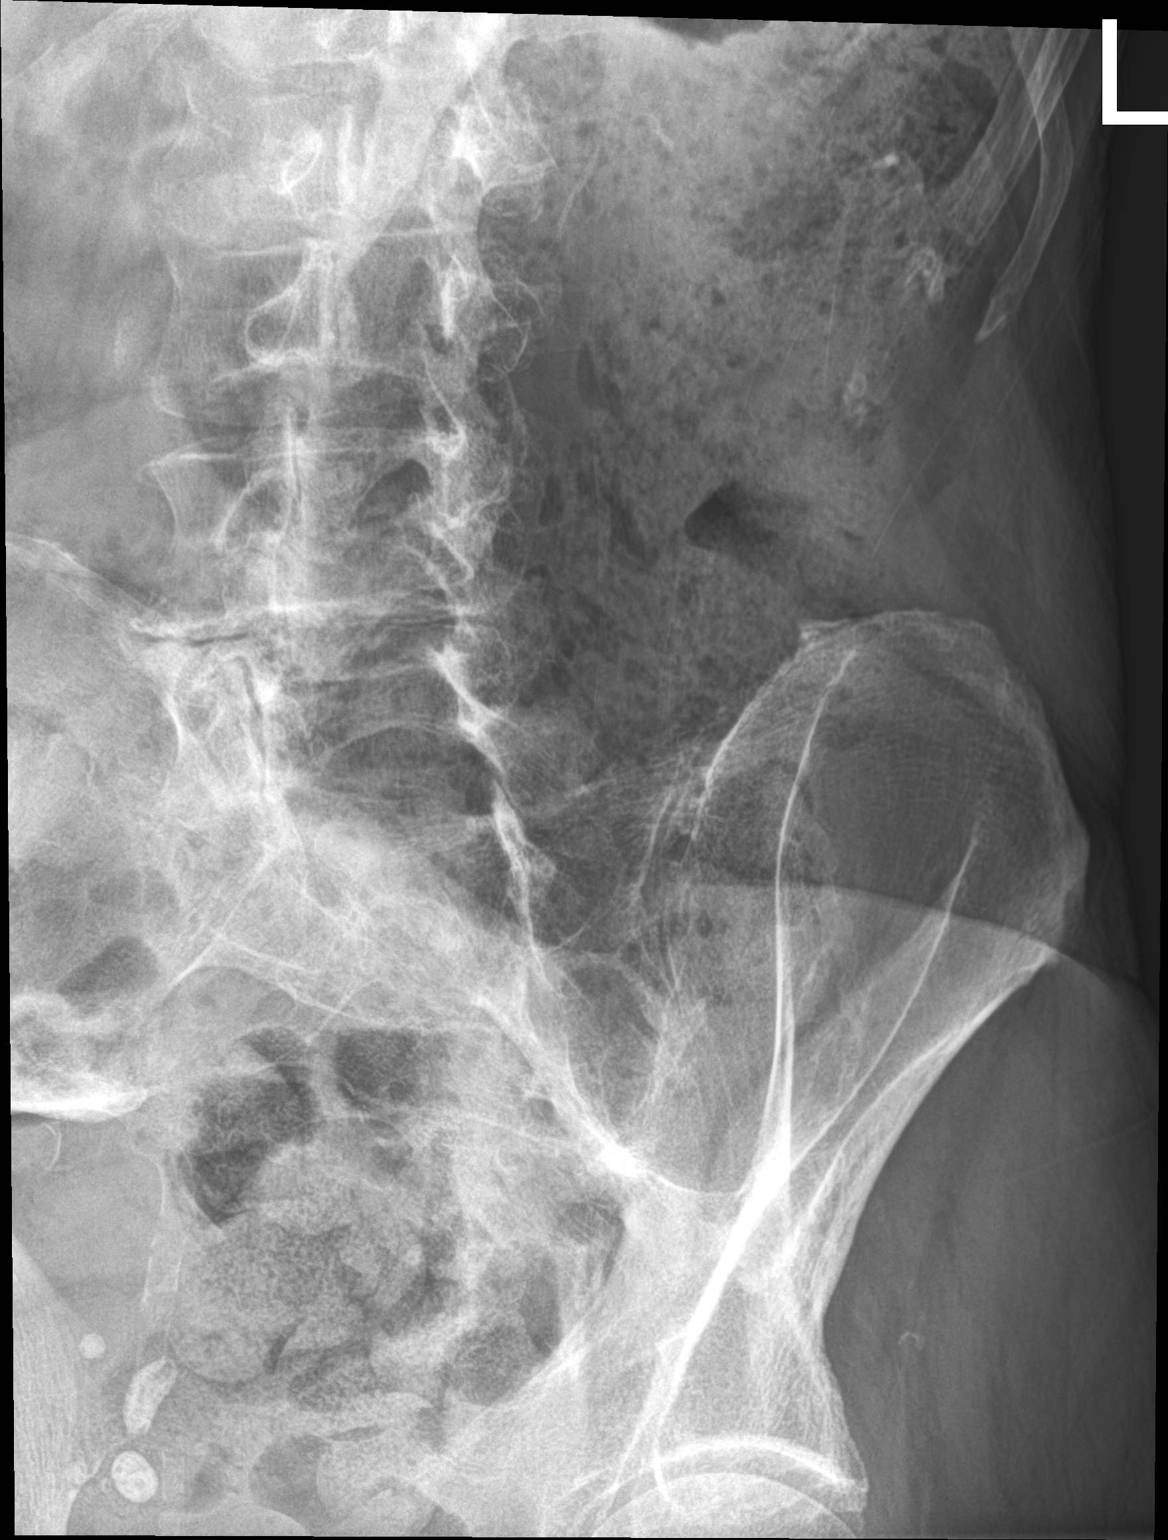

[si joint (2 of 2)]
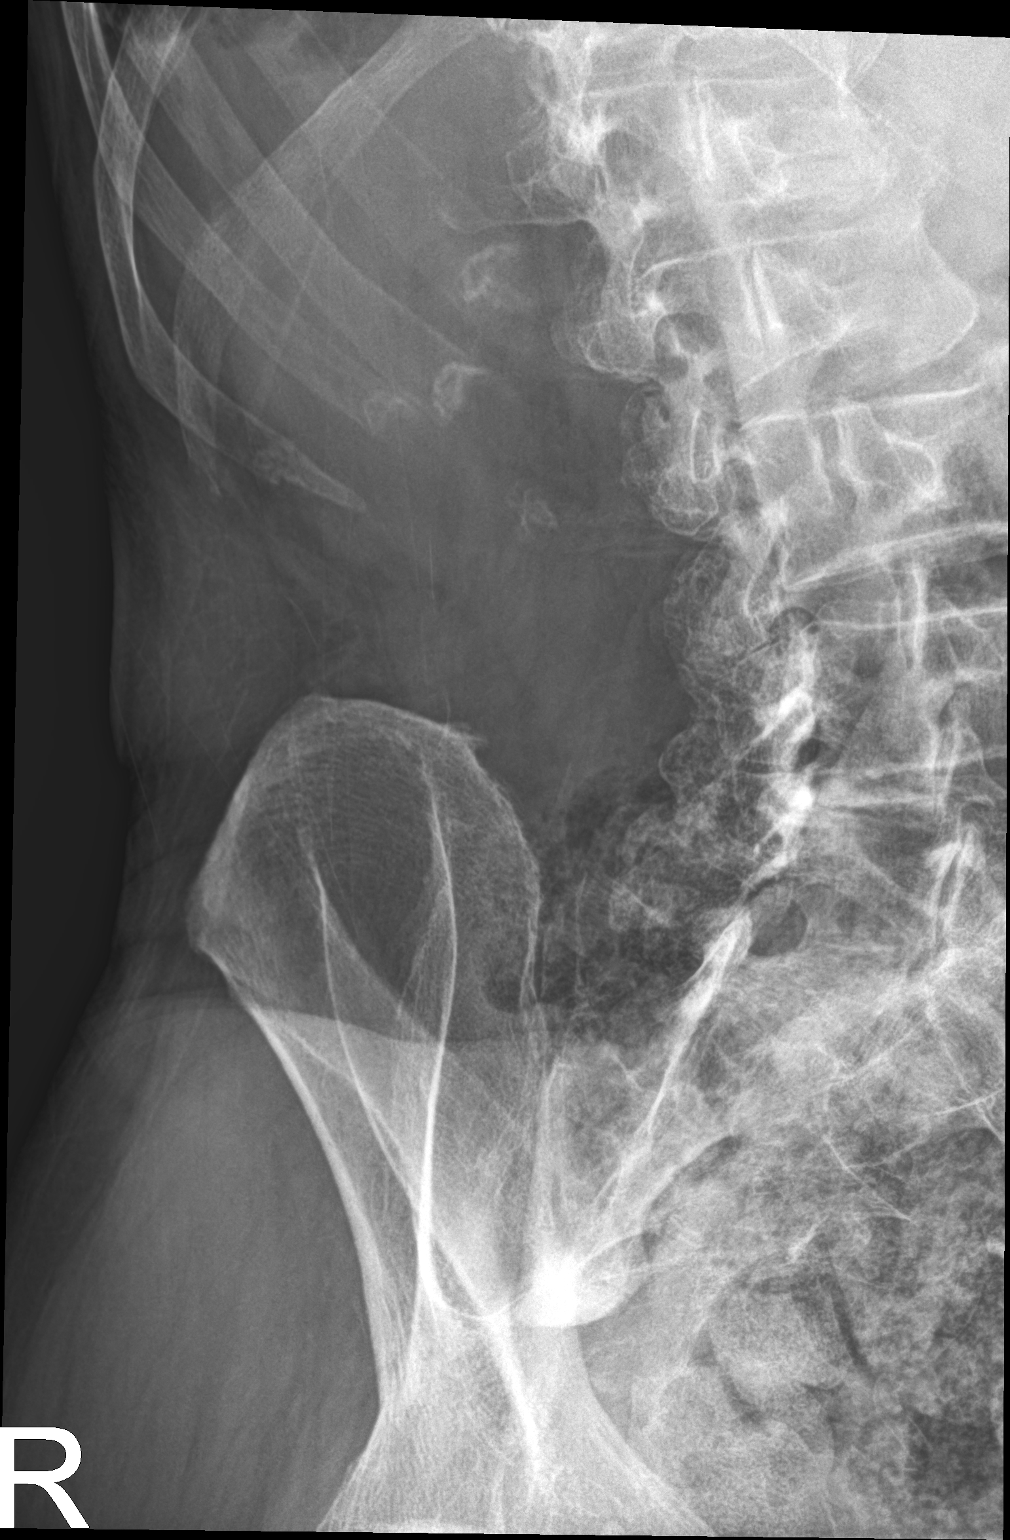

[2 of 2 positions shown; findings below may reference images not displayed]

FINDINGS: Bilateral oblique views as well as frontal view obtained during
lumbar series reviewed. No fracture or diastases evident. There is
no appreciable joint space narrowing for sacroiliitis.
IMPRESSION: No appreciable arthropathy.  No fracture or diastases evident.

## 2019-09-13 IMAGING — DX DG ANKLE COMPLETE 3+V*L*
3 series · 3 of 3 positions shown · non-contrast
Comparison: None.

CLINICAL DATA: Left ankle pain.

EXAM:
LEFT ANKLE COMPLETE - 3+ VIEW

[ankle obl]
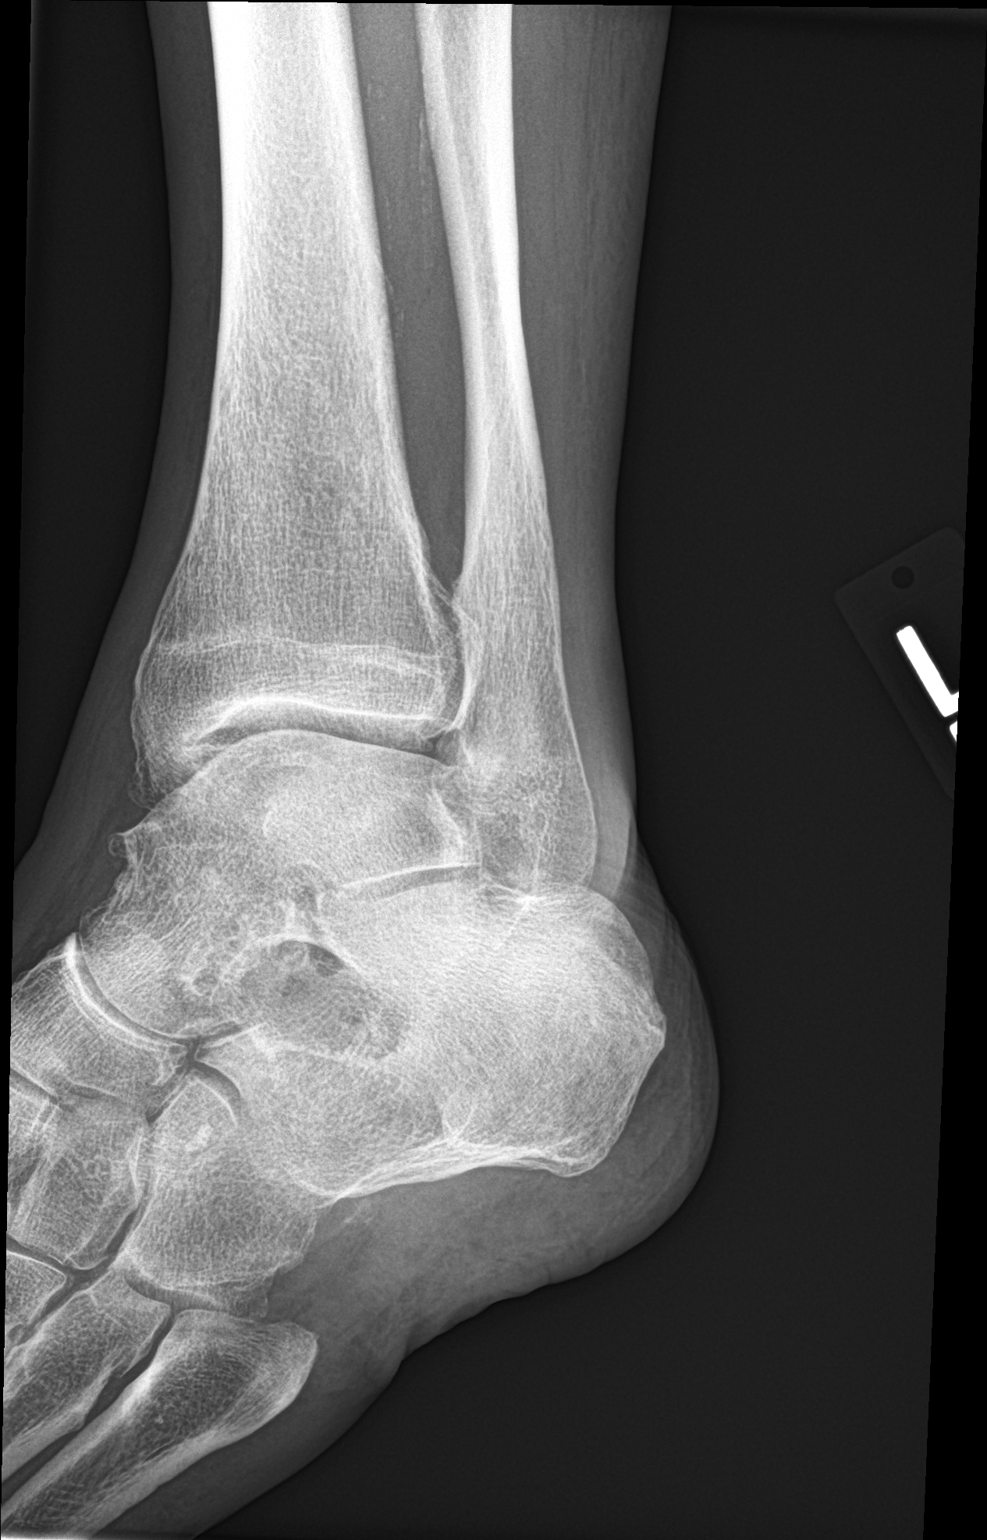

[ankle ap]
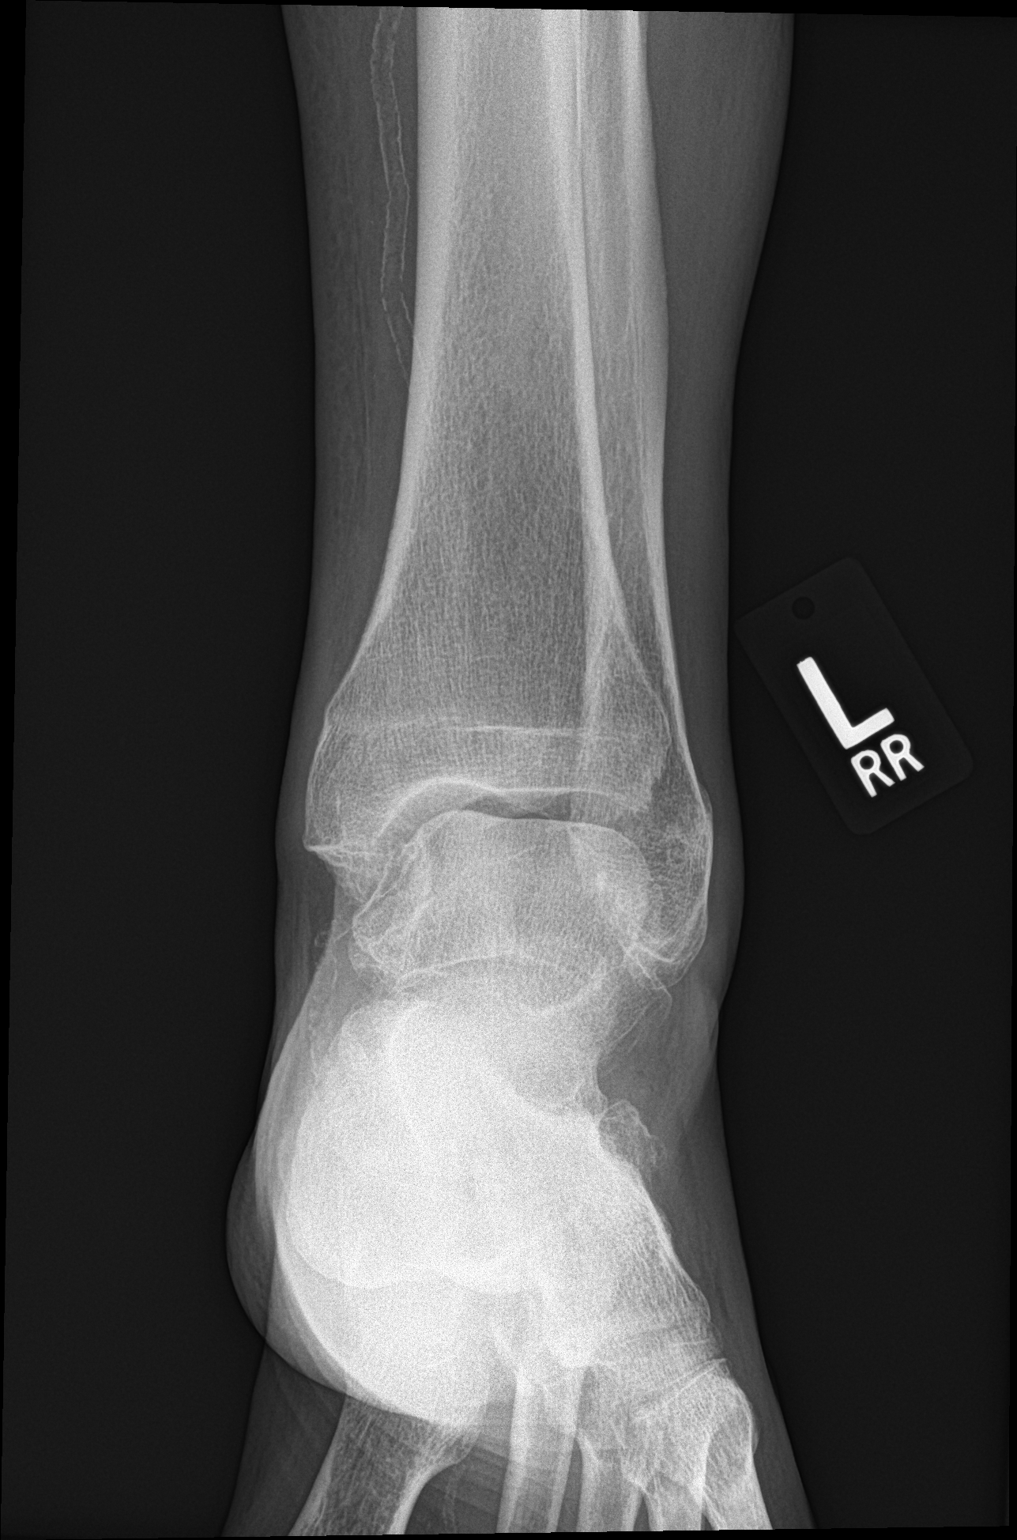

[ankle lat]
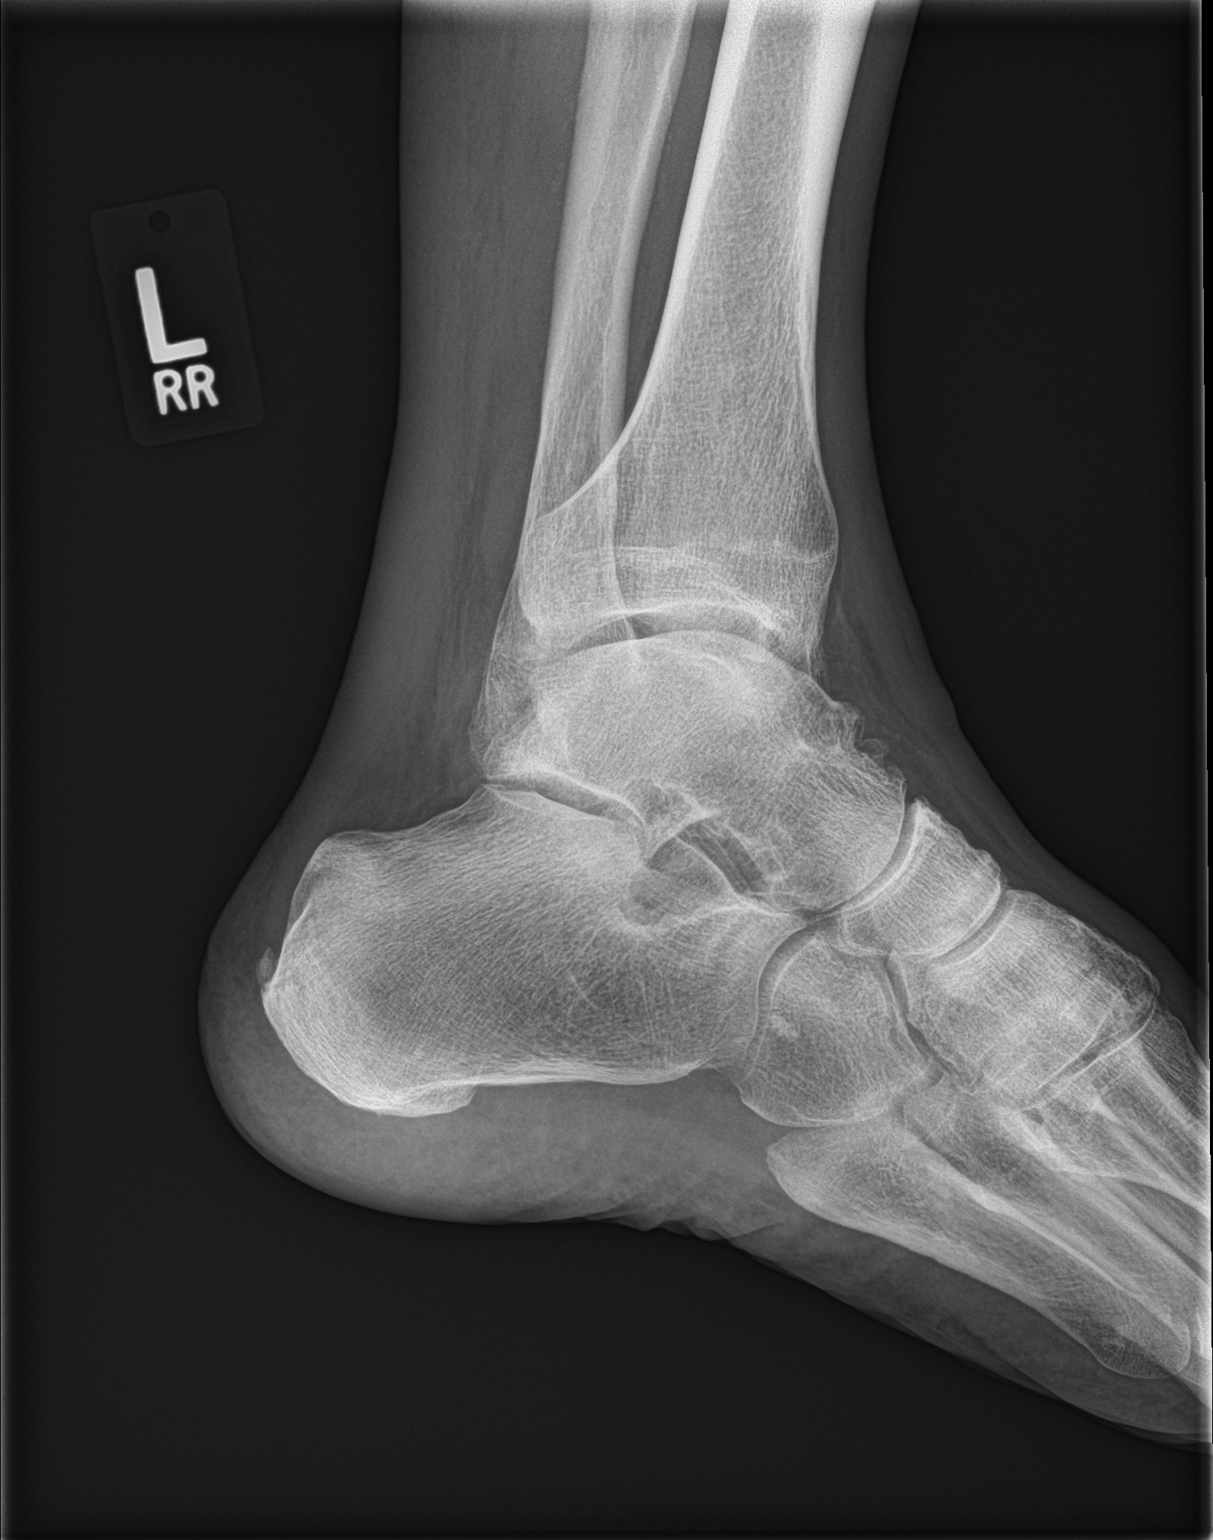

[3 of 3 positions shown; findings below may reference images not displayed]

FINDINGS: Alignment appears normal. Ankle mortise appears symmetric. No
fracture line or displaced fracture fragment seen. No acute or
suspicious cortical irregularity or osseous lesion.

Mild degenerative spurring at the tibiotalar joint space and within
the proximal midfoot. No evidence of advanced degenerative change.
Adjacent soft tissues are unremarkable.
IMPRESSION: 1. No acute findings.
2. Mild degenerative change.

## 2019-09-27 ENCOUNTER — Encounter (INDEPENDENT_AMBULATORY_CARE_PROVIDER_SITE_OTHER): Payer: Medicare HMO | Admitting: Ophthalmology

## 2019-10-02 ENCOUNTER — Encounter (INDEPENDENT_AMBULATORY_CARE_PROVIDER_SITE_OTHER): Payer: Self-pay | Admitting: Ophthalmology

## 2019-10-02 ENCOUNTER — Other Ambulatory Visit: Payer: Self-pay

## 2019-10-02 ENCOUNTER — Ambulatory Visit (INDEPENDENT_AMBULATORY_CARE_PROVIDER_SITE_OTHER): Payer: Medicare HMO | Admitting: Ophthalmology

## 2019-10-02 DIAGNOSIS — H34811 Central retinal vein occlusion, right eye, with macular edema: Secondary | ICD-10-CM | POA: Diagnosis not present

## 2019-10-02 DIAGNOSIS — H353221 Exudative age-related macular degeneration, left eye, with active choroidal neovascularization: Secondary | ICD-10-CM | POA: Diagnosis not present

## 2019-10-02 MED ORDER — AFLIBERCEPT 2MG/0.05ML IZ SOLN FOR KALEIDOSCOPE
2.0000 mg | INTRAVITREAL | Status: AC | PRN
  Administered 2019-10-02: 2 mg via INTRAVITREAL

## 2019-10-02 NOTE — Progress Notes (Signed)
10/02/2019     CHIEF COMPLAINT Patient presents for Retina Follow Up   HISTORY OF PRESENT ILLNESS: Chad Russell is a 86 y.o. male who presents to the clinic today for:   HPI    Retina Follow Up    Patient presents with  CRVO/BRVO.  In right eye.  Severity is moderate.  Duration of 7.5.  Since onset it is stable.  I, the attending physician,  performed the HPI with the patient and updated documentation appropriately.          Comments    7.5 Week CRVO f\u OD. Possible Eylea OD. OCT  Pt states vision is stable Denies any complaints       Last edited by Tilda Franco on 10/02/2019 10:34 AM. (History)      Referring physician: [name], MD [address] [place],  [place] [address]  HISTORICAL INFORMATION:   Selected notes from the MEDICAL RECORD NUMBER       CURRENT MEDICATIONS: Current Outpatient Medications (Ophthalmic Drugs)  Medication Sig  . timolol (TIMOPTIC) 0.5 % ophthalmic solution PLACE ONE DROP IN [facility] EYE ONCE DAILY  . timolol (TIMOPTIC) 0.5 % ophthalmic solution Place 1 drop into both eyes 2 (two) times daily.   No current facility-administered medications for this visit. (Ophthalmic Drugs)   Current Outpatient Medications (Other)  Medication Sig  . acetaminophen (TYLENOL) 500 MG tablet Take 1 tablet (500 mg total) by mouth every 8 (eight) hours.  Marland Kitchen amLODipine (NORVASC) 10 MG tablet Take 1 tablet (10 mg total) by mouth daily.  . bisacodyl (DULCOLAX) 5 MG EC tablet Take 1 tablet (5 mg total) by mouth daily as needed for moderate constipation.  . carbidopa-levodopa (SINEMET) 25-100 MG tablet Take 1.5 tablets by mouth in the morning and at bedtime.  . feeding supplement, ENSURE ENLIVE, (ENSURE ENLIVE) LIQD Take 237 mLs by mouth 2 (two) times daily between meals.  Marland Kitchen lisinopril (ZESTRIL) 20 MG tablet Take 1 tablet (20 mg total) by mouth daily. (Patient not taking: Reported on 02/21/2019)  . lisinopril-hydrochlorothiazide (ZESTORETIC) 10-12.5 MG  tablet TAKE ONE TABLET BY MOUTH DAILY  . Misc Natural Products (PROSTATE) CAPS Take 1 tablet daily by mouth. Prostate Plus otc supplement  . Multiple Vitamin (MULTIVITAMIN WITH MINERALS) TABS tablet Take 1 tablet daily by mouth.  . Multiple Vitamins-Minerals (PRESERVISION AREDS 2+MULTI VIT PO) Take 1 capsule by mouth 2 (two) times daily.  Marland Kitchen senna (SENOKOT) 8.6 MG TABS tablet Take 1 tablet (8.6 mg total) by mouth at bedtime as needed for mild constipation.  . vitamin C (ASCORBIC ACID) 500 MG tablet Take 500 mg by mouth daily.  Alveda Reasons 20 MG TABS tablet TAKE ONE TABLET BY MOUTH DAILY WITH SUPPER   No current facility-administered medications for this visit. (Other)      REVIEW OF SYSTEMS:    ALLERGIES Allergies  Allergen Reactions  . Doxycycline Other (See Comments)    Upset stomach  . Keflex [Cephalexin] Other (See Comments)    Abdominal discomfort 04-19-17 Pt has tolerated orally    PAST MEDICAL HISTORY Past Medical History:  Diagnosis Date  . Arthritis   . Atrial fibrillation ([place])   . Balance problem 10/31/2013  . BPH (benign prostatic hyperplasia) 2014   Urology Dr [name] with [facility] [phone]   . Bradycardia    a. baseline HR in the 30's. Asymptomatic - no history of PPM placement.   Marland Kitchen CAD (coronary artery disease)    LHC 03/16/11: Mid LAD  10-20%, proximal circumflex 10%, mid RCA 20%, EF 55%.  . Cataract   . Complete heart block ([place])   . Fracture of femoral neck, right ([place]) 04/19/2017  . GERD (gastroesophageal reflux disease)    takes occasional  zantac  . Hx of echocardiogram    Echo 02/2011: EF 65-70%, normal wall motion, MAC, mild MR, mild LAE, mild RVE  . Hypertension   . OSA (obstructive sleep apnea)    per patient went to have sleep study done about 2 years ago, bu they never F/U with him about results; but wife reports he still has periods of apnea at night   . Talar fracture    casted no surg   Past Surgical History:  Procedure Laterality Date  .  ABDOMINAL AORTOGRAM W/LOWER EXTREMITY N/A 01/17/2017   Procedure: ABDOMINAL AORTOGRAM W/LOWER EXTREMITY;  Surgeon: Angelia Mould, MD;  Location: [facility];  Service: Cardiovascular;  Laterality: N/A;  . COLONOSCOPY     Repeated and normal in 2011  . FRACTURE SURGERY    . HEMIARTHROPLASTY HIP Right 04/19/2017  . HEMORROIDECTOMY    . HIP ARTHROPLASTY Right 04/19/2017   Procedure: ARTHROPLASTY BIPOLAR HIP (HEMIARTHROPLASTY);  Surgeon: Marchia Bond, MD;  Location: [facility];  Service: Orthopedics;  Laterality: Right;  . HIP ARTHROPLASTY Left 12/16/2018   Procedure: ARTHROPLASTY BIPOLAR HIP (HEMIARTHROPLASTY);  Surgeon: Alt[place]e [place], MD;  Location: [facility];  Service: Orthopedics;  Laterality: Left;  . INGUINAL HERNIA REPAIR Right 02/26/2014   Procedure: LAPAROSCOPIC RIGHT INGUINAL HERNIA REPAIR;  Surgeon: Ralene Ok, MD;  Location: [facility];  Service: General;  Laterality: Right;  . INGUINAL HERNIA REPAIR    . INSERTION OF MESH Right 02/26/2014   Procedure: INSERTION OF MESH;  Surgeon: Ralene Ok, MD;  Location: [facility];  Service: General;  Laterality: Right;  . LEFT HEART CATHETERIZATION WITH CORONARY ANGIOGRAM N/A 03/16/2011   Procedure: LEFT HEART CATHETERIZATION WITH CORONARY ANGIOGRAM;  Surgeon: Peter M Martinique, MD;  Location: [facility] CATH [facility];  Service: Cardiovascular;  Laterality: N/A;  . PROSTATE BIOPSY     negative - [facility]  . TRANSURETHRAL RESECTION OF PROSTATE N/A 09/07/2016   Procedure: TRANSURETHRAL RESECTION OF THE PROSTATE (TURP);  Surgeon: Irine Seal, MD;  Location: [facility];  Service: Urology;  Laterality: N/A;    FAMILY HISTORY Family History  Problem Relation Age of Onset  . Hypertension Mother   . Multiple sclerosis Sister     SOCIAL HISTORY Social History   Tobacco Use  . Smoking status: Former Smoker    Packs/day: 1.00    Years: 3.00    Pack years: 3.00    Types: Cigarettes    Quit date: 03/17/1955    Years since quitting: 64.5  . Smokeless  tobacco: Former Network engineer  . Vaping Use: Never used  Substance Use Topics  . Alcohol use: Yes    Alcohol/week: 14.0 standard drinks    Types: 7 Cans of beer, 7 Standard drinks or equivalent per week    Comment: daily  . Drug use: No         OPHTHALMIC EXAM:  Base Eye Exam    Visual Acuity (Snellen - Linear)      Right Left   Dist cc E card @ 4' 20/80 +   Dist ph cc NI NI   Correction: Glasses       Tonometry (Tonopen, 10:38 AM)      Right Left   Pressure 14 15  Pupils      Pupils Dark Light Shape React APD   Right PERRL 4 3 Round Brisk None   Left PERRL 4 3 Round Brisk None       Visual Fields      Left Right   Restrictions Total superior temporal, inferior temporal, inferior nasal deficiencies Total superior nasal, inferior nasal deficiencies       Neuro/Psych    Oriented x3: Yes   Mood/Affect: Normal       Dilation    Right eye: 1.0% Mydriacyl, 2.5% Phenylephrine @ 10:38 AM        Slit Lamp and Fundus Exam    External Exam      Right Left   External Normal Normal       Slit Lamp Exam      Right Left   Lids/Lashes Normal Normal   Conjunctiva/Sclera White and quiet White and quiet   Cornea Clear Clear   Anterior Chamber Deep and quiet Deep and quiet   Iris Round and reactive Round and reactive   Lens Posterior chamber intraocular lens Posterior chamber intraocular lens   Anterior Vitreous Normal Normal       Fundus Exam      Right Left   Posterior Vitreous Posterior vitreous detachment    Disc Normal    C/D Ratio 0.5    Macula Macular thickening, Cystoid macular edema, Microaneurysms, Atrophy, Retinal pigment epithelial atrophy, Drusen, Soft drusen    Vessels Old central retinal vein occlusion    Periphery Normal           IMAGING AND PROCEDURES  Imaging and Procedures for 10/02/19  OCT, Retina - OU - Both Eyes       Right Eye Quality was good. Scan locations included subfoveal. Central Foveal Thickness: 792.  Progression has improved. Findings include abnormal foveal contour, cystoid macular edema.   Left Eye Quality was good. Scan locations included subfoveal. Central Foveal Thickness: 304. Progression has worsened. Findings include abnormal foveal contour, choroidal neovascular membrane, cystoid macular edema, subretinal fluid.   Notes OD massive cystoid macular edema slightly improved on Eylea from central retinal vein occlusion.  Active CME and subretinal fluid secondary to CNVM temporal portion of the macula threatening visual acuity in his best eye left eye       Intravitreal Injection, Pharmacologic Agent - OD - Right Eye       Time Out 10/02/2019. 11:19 AM. Confirmed correct patient, procedure, site, and patient consented.   Anesthesia Topical anesthesia was used. Anesthetic medications included Akten 3.5%.   Procedure Preparation included Ofloxacin , 5% betadine to ocular surface, 10% betadine to eyelids. A 30 gauge needle was used.   Injection:  2 mg aflibercept Alfonse Flavors) SOLN   NDC: [id number], Lot: [id number]   Route: Intravitreal, Site: Right Eye, Waste: 0 mg  Post-op Post injection exam found visual acuity of at least counting fingers. The patient tolerated the procedure well. There were no complications. The patient received written and verbal post procedure care education. Post injection medications were not given.                 ASSESSMENT/PLAN:  Central retinal vein occlusion with macular edema of right eye CME improved yet still active and massive, repeat OD Eylea today      ICD-10-CM   1. Central retinal vein occlusion with macular edema of right eye  H34.8110 OCT, Retina - OU - Both Eyes    Intravitreal Injection, Pharmacologic Agent - OD -  Right Eye    aflibercept (EYLEA) SOLN 2 mg  2. Exudative age-related macular degeneration of left eye with active choroidal neovascularization ([place])  H35.3221     1.  Repeat intravitreal Eylea OD today  2.   Progression of wet ARMD OS threatening visual acuity, will need to commence intravitreal Eylea left eye within a week  3.RV 8 weeks exam OD, Possible Eylea  Ophthalmic Meds Ordered this visit:  Meds ordered this encounter  Medications  . aflibercept (EYLEA) SOLN 2 mg       Return in about 1 week (around 10/09/2019) for dilate, OS, EYLEA OCT  for Wet ARMD.  There are no Patient Instructions on file for this visit.   Explained the diagnoses, plan, and follow up with the patient and they expressed understanding.  Patient expressed understanding of the importance of proper follow up care.   Clent Demark Manav Pierotti M.D. Diseases & Surgery of the Retina and Vitreous Retina & Diabetic [place] 10/02/19     Abbreviations: M myopia (nearsighted); A astigmatism; H hyperopia (farsighted); P presbyopia; Mrx spectacle prescription;  CTL contact lenses; OD right eye; OS left eye; OU both eyes  XT exotropia; ET esotropia; PEK punctate epithelial keratitis; PEE punctate epithelial erosions; DES dry eye syndrome; MGD meibomian gland dysfunction; ATs artificial tears; PFAT's preservative free artificial tears; [place] nuclear sclerotic cataract; PSC posterior subcapsular cataract; ERM epi-retinal membrane; PVD posterior vitreous detachment; RD retinal detachment; DM diabetes mellitus; DR diabetic retinopathy; NPDR non-proliferative diabetic retinopathy; PDR proliferative diabetic retinopathy; CSME clinically significant macular edema; DME diabetic macular edema; dbh dot blot hemorrhages; CWS cotton wool spot; POAG primary open angle glaucoma; C/D cup-to-disc ratio; HVF humphrey visual field; GVF goldmann visual field; OCT optical coherence tomography; IOP intraocular pressure; BRVO Branch retinal vein occlusion; CRVO central retinal vein occlusion; CRAO central retinal artery occlusion; BRAO branch retinal artery occlusion; RT retinal tear; SB scleral buckle; PPV pars plana vitrectomy; VH Vitreous hemorrhage; PRP  panretinal laser photocoagulation; IVK intravitreal kenalog; VMT vitreomacular traction; MH Macular hole;  NVD neovascularization of the disc; NVE neovascularization elsewhere; AREDS age related eye disease study; ARMD age related macular degeneration; POAG primary open angle glaucoma; EBMD epithelial/anterior basement membrane dystrophy; ACIOL anterior chamber intraocular lens; IOL intraocular lens; PCIOL posterior chamber intraocular lens; Phaco/IOL phacoemulsification with intraocular lens placement; [place] photorefractive keratectomy; LASIK laser assisted in situ keratomileusis; HTN hypertension; DM diabetes mellitus; COPD chronic obstructive pulmonary disease

## 2019-10-02 NOTE — Assessment & Plan Note (Signed)
CME improved yet still active and massive, repeat OD Eylea today

## 2019-10-10 ENCOUNTER — Ambulatory Visit (INDEPENDENT_AMBULATORY_CARE_PROVIDER_SITE_OTHER): Payer: Medicare HMO | Admitting: Ophthalmology

## 2019-10-10 ENCOUNTER — Encounter (INDEPENDENT_AMBULATORY_CARE_PROVIDER_SITE_OTHER): Payer: Self-pay | Admitting: Ophthalmology

## 2019-10-10 ENCOUNTER — Other Ambulatory Visit: Payer: Self-pay

## 2019-10-10 ENCOUNTER — Encounter (INDEPENDENT_AMBULATORY_CARE_PROVIDER_SITE_OTHER): Payer: Medicare HMO | Admitting: Ophthalmology

## 2019-10-10 DIAGNOSIS — H34811 Central retinal vein occlusion, right eye, with macular edema: Secondary | ICD-10-CM

## 2019-10-10 DIAGNOSIS — H353221 Exudative age-related macular degeneration, left eye, with active choroidal neovascularization: Secondary | ICD-10-CM

## 2019-10-10 MED ORDER — AFLIBERCEPT 2MG/0.05ML IZ SOLN FOR KALEIDOSCOPE
2.0000 mg | INTRAVITREAL | Status: AC | PRN
  Administered 2019-10-10: 2 mg via INTRAVITREAL

## 2019-10-10 NOTE — Assessment & Plan Note (Signed)
OD, only 8 days status post intravitreal Eylea for massive CME secondary to central retinal vein occlusion.  Much less CME today and foveal atrophy remains.  Thus this condition in the right eye is responsive to [facility]

## 2019-10-10 NOTE — Progress Notes (Signed)
10/10/2019     CHIEF COMPLAINT Patient presents for Retina Follow Up   HISTORY OF PRESENT ILLNESS: Chad Russell is a 86 y.o. male who presents to the clinic today for:   HPI    Retina Follow Up    Patient presents with  Wet AMD.  In left eye.  Severity is moderate.  Since onset it is stable.  I, the attending physician,  performed the HPI with the patient and updated documentation appropriately.          Comments    Wet AMD f\u OS. Eylea OS. OCT  Pt states slight improvement in vision. Denies any complaints.       Last edited by Tilda Franco on 10/10/2019  9:48 AM. (History)      Referring physician: [name], MD [address] [place],  [place] [address]  HISTORICAL INFORMATION:   Selected notes from the MEDICAL RECORD NUMBER       CURRENT MEDICATIONS: Current Outpatient Medications (Ophthalmic Drugs)  Medication Sig  . timolol (TIMOPTIC) 0.5 % ophthalmic solution PLACE ONE DROP IN [facility] EYE ONCE DAILY  . timolol (TIMOPTIC) 0.5 % ophthalmic solution Place 1 drop into both eyes 2 (two) times daily.   No current facility-administered medications for this visit. (Ophthalmic Drugs)   Current Outpatient Medications (Other)  Medication Sig  . acetaminophen (TYLENOL) 500 MG tablet Take 1 tablet (500 mg total) by mouth every 8 (eight) hours.  Marland Kitchen amLODipine (NORVASC) 10 MG tablet Take 1 tablet (10 mg total) by mouth daily.  . bisacodyl (DULCOLAX) 5 MG EC tablet Take 1 tablet (5 mg total) by mouth daily as needed for moderate constipation.  . carbidopa-levodopa (SINEMET) 25-100 MG tablet Take 1.5 tablets by mouth in the morning and at bedtime.  . feeding supplement, ENSURE ENLIVE, (ENSURE ENLIVE) LIQD Take 237 mLs by mouth 2 (two) times daily between meals.  Marland Kitchen lisinopril (ZESTRIL) 20 MG tablet Take 1 tablet (20 mg total) by mouth daily. (Patient not taking: Reported on 02/21/2019)  . lisinopril-hydrochlorothiazide (ZESTORETIC) 10-12.5 MG tablet TAKE ONE  TABLET BY MOUTH DAILY  . Misc Natural Products (PROSTATE) CAPS Take 1 tablet daily by mouth. Prostate Plus otc supplement  . Multiple Vitamin (MULTIVITAMIN WITH MINERALS) TABS tablet Take 1 tablet daily by mouth.  . Multiple Vitamins-Minerals (PRESERVISION AREDS 2+MULTI VIT PO) Take 1 capsule by mouth 2 (two) times daily.  Marland Kitchen senna (SENOKOT) 8.6 MG TABS tablet Take 1 tablet (8.6 mg total) by mouth at bedtime as needed for mild constipation.  . vitamin C (ASCORBIC ACID) 500 MG tablet Take 500 mg by mouth daily.  Alveda Reasons 20 MG TABS tablet TAKE ONE TABLET BY MOUTH DAILY WITH SUPPER   No current facility-administered medications for this visit. (Other)      REVIEW OF SYSTEMS:    ALLERGIES Allergies  Allergen Reactions  . Doxycycline Other (See Comments)    Upset stomach  . Keflex [Cephalexin] Other (See Comments)    Abdominal discomfort 04-19-17 Pt has tolerated orally    PAST MEDICAL HISTORY Past Medical History:  Diagnosis Date  . Arthritis   . Atrial fibrillation ([place])   . Balance problem 10/31/2013  . BPH (benign prostatic hyperplasia) 2014   Urology Dr [name] with [facility] [phone]   . Bradycardia    a. baseline HR in the 30's. Asymptomatic - no history of PPM placement.   Marland Kitchen CAD (coronary artery disease)    LHC 03/16/11: Mid LAD 10-20%, proximal circumflex  10%, mid RCA 20%, EF 55%.  . Cataract   . Complete heart block ([place])   . Fracture of femoral neck, right ([place]) 04/19/2017  . GERD (gastroesophageal reflux disease)    takes occasional  zantac  . Hx of echocardiogram    Echo 02/2011: EF 65-70%, normal wall motion, MAC, mild MR, mild LAE, mild RVE  . Hypertension   . OSA (obstructive sleep apnea)    per patient went to have sleep study done about 2 years ago, bu they never F/U with him about results; but wife reports he still has periods of apnea at night   . Talar fracture    casted no surg   Past Surgical History:  Procedure Laterality Date  . ABDOMINAL AORTOGRAM  W/LOWER EXTREMITY N/A 01/17/2017   Procedure: ABDOMINAL AORTOGRAM W/LOWER EXTREMITY;  Surgeon: Angelia Mould, MD;  Location: [facility];  Service: Cardiovascular;  Laterality: N/A;  . COLONOSCOPY     Repeated and normal in 2011  . FRACTURE SURGERY    . HEMIARTHROPLASTY HIP Right 04/19/2017  . HEMORROIDECTOMY    . HIP ARTHROPLASTY Right 04/19/2017   Procedure: ARTHROPLASTY BIPOLAR HIP (HEMIARTHROPLASTY);  Surgeon: Marchia Bond, MD;  Location: [facility];  Service: Orthopedics;  Laterality: Right;  . HIP ARTHROPLASTY Left 12/16/2018   Procedure: ARTHROPLASTY BIPOLAR HIP (HEMIARTHROPLASTY);  Surgeon: Altamese Allison, MD;  Location: [facility];  Service: Orthopedics;  Laterality: Left;  . INGUINAL HERNIA REPAIR Right 02/26/2014   Procedure: LAPAROSCOPIC RIGHT INGUINAL HERNIA REPAIR;  Surgeon: Ralene Ok, MD;  Location: [facility];  Service: General;  Laterality: Right;  . INGUINAL HERNIA REPAIR    . INSERTION OF MESH Right 02/26/2014   Procedure: INSERTION OF MESH;  Surgeon: Ralene Ok, MD;  Location: [facility];  Service: General;  Laterality: Right;  . LEFT HEART CATHETERIZATION WITH CORONARY ANGIOGRAM N/A 03/16/2011   Procedure: LEFT HEART CATHETERIZATION WITH CORONARY ANGIOGRAM;  Surgeon: Peter M Martinique, MD;  Location: [facility] C[facility];  Service: Cardiovascular;  Laterality: N/A;  . PROSTATE BIOPSY     negative - [facility]  . TRANSURETHRAL RESECTION OF PROSTATE N/A 09/07/2016   Procedure: TRANSURETHRAL RESECTION OF THE PROSTATE (TURP);  Surgeon: Irine Seal, MD;  Location: [facility];  Service: Urology;  Laterality: N/A;    FAMILY HISTORY Family History  Problem Relation Age of Onset  . Hypertension Mother   . Multiple sclerosis Sister     SOCIAL HISTORY Social History   Tobacco Use  . Smoking status: Former Smoker    Packs/day: 1.00    Years: 3.00    Pack years: 3.00    Types: Cigarettes    Quit date: 03/17/1955    Years since quitting: 64.6  . Smokeless tobacco: Former Health visitor  . Vaping Use: Never used  Substance Use Topics  . Alcohol use: Yes    Alcohol/week: 14.0 standard drinks    Types: 7 Cans of beer, 7 Standard drinks or equivalent per week    Comment: daily  . Drug use: No         OPHTHALMIC EXAM:  Base Eye Exam    Visual Acuity (Snellen - Linear)      Right Left   Dist cc CF @ 4' 20/80   Dist ph cc  NI   Correction: Glasses       Tonometry (Tonopen, 9:53 AM)      Right Left   Pressure 17 20       Neuro/Psych  Oriented x3: Yes   Mood/Affect: Normal       Dilation    Left eye: 1.0% Mydriacyl, 2.5% Phenylephrine @ 9:53 AM        Slit Lamp and Fundus Exam    External Exam      Right Left   External Normal Normal       Slit Lamp Exam      Right Left   Lids/Lashes Normal Normal   Conjunctiva/Sclera White and quiet White and quiet   Cornea Clear Clear   Anterior Chamber Deep and quiet Deep and quiet   Iris Round and reactive Round and reactive   Lens Posterior chamber intraocular lens Posterior chamber intraocular lens   Anterior Vitreous Normal Normal       Fundus Exam      Right Left   Posterior Vitreous  Posterior vitreous detachment   Disc  Normal   C/D Ratio  0.75   Macula  Geographic atrophy, Soft drusen   Vessels  Normal   Periphery  Normal          IMAGING AND PROCEDURES  Imaging and Procedures for 10/10/19  OCT, Retina - OU - Both Eyes       Right Eye Quality was good. Scan locations included subfoveal. Central Foveal Thickness: 240. Progression has improved. Findings include cystoid macular edema, central retinal atrophy, outer retinal atrophy.   Left Eye Quality was good. Scan locations included subfoveal. Findings include abnormal foveal contour, choroidal neovascular membrane, cystoid macular edema.   Notes OD, only 8 days status post intravitreal Eylea for massive CME secondary to central retinal vein occlusion.  Much less CME today and foveal atrophy remains.  Thus this  condition in the right eye is responsive to [facility]  New onset CNVM juxta foveal in visual acuity threat OS, will commence intravitreal Eylea today       Intravitreal Injection, Pharmacologic Agent - OS - Left Eye       Time Out 10/10/2019. 11:18 AM. Confirmed correct patient, procedure, site, and patient consented.   Anesthesia Topical anesthesia was used. Anesthetic medications included Akten 3.5%.   Procedure Preparation included Tobramycin 0.3%, 10% betadine to eyelids, 5% betadine to ocular surface. A 30 gauge needle was used.   Injection:  2 mg aflibercept Alfonse Flavors) SOLN   NDC: [id number], Lot: [id number]   Route: Intravitreal, Site: Left Eye, Waste: 0 mg  Post-op Post injection exam found visual acuity of at least counting fingers. The patient tolerated the procedure well. There were no complications. The patient received written and verbal post procedure care education. Post injection medications were not given.                 ASSESSMENT/PLAN:  Central retinal vein occlusion with macular edema of right eye OD, only 8 days status post intravitreal Eylea for massive CME secondary to central retinal vein occlusion.  Much less CME today and foveal atrophy remains.  Thus this condition in the right eye is responsive to [facility]       ICD-10-CM   1. Exudative age-related macular degeneration of left eye with active choroidal neovascularization (HCC)  H35.3221 OCT, Retina - OU - Both Eyes    Intravitreal Injection, Pharmacologic Agent - OS - Left Eye    aflibercept (EYLEA) SOLN 2 mg  2. Central retinal vein occlusion with macular edema of right eye  H34.8110     1.  2.  3.  Ophthalmic Meds Ordered this visit:  Meds ordered this encounter  Medications  . aflibercept (EYLEA) SOLN 2 mg       Return in about 4 weeks (around 11/07/2019) for dilate, OS, EYLEA OCT.  There are no Patient Instructions on file for this visit.   Explained the diagnoses, plan, and  follow up with the patient and they expressed understanding.  Patient expressed understanding of the importance of proper follow up care.   Clent Demark Montario Zilka M.D. Diseases & Surgery of the Retina and Vitreous [facility] [place] 10/10/19     Abbreviations: M myopia (nearsighted); A astigmatism; H hyperopia (farsighted); P presbyopia; Mrx spectacle prescription;  CTL contact lenses; OD right eye; OS left eye; OU both eyes  XT exotropia; ET esotropia; PEK punctate epithelial keratitis; PEE punctate epithelial erosions; DES dry eye syndrome; MGD meibomian gland dysfunction; ATs artificial tears; PFAT's preservative free artificial tears; [place] nuclear sclerotic cataract; PSC posterior subcapsular cataract; ERM epi-retinal membrane; PVD posterior vitreous detachment; RD retinal detachment; DM diabetes mellitus; DR diabetic retinopathy; NPDR non-proliferative diabetic retinopathy; PDR proliferative diabetic retinopathy; CSME clinically significant macular edema; DME diabetic macular edema; dbh dot blot hemorrhages; CWS cotton wool spot; POAG primary open angle glaucoma; C/D cup-to-disc ratio; HVF humphrey visual field; GVF goldmann visual field; OCT optical coherence tomography; IOP intraocular pressure; BRVO Branch retinal vein occlusion; CRVO central retinal vein occlusion; CRAO central retinal artery occlusion; BRAO branch retinal artery occlusion; RT retinal tear; SB scleral buckle; PPV pars plana vitrectomy; VH Vitreous hemorrhage; PRP panretinal laser photocoagulation; IVK intravitreal kenalog; VMT vitreomacular traction; MH Macular hole;  NVD neovascularization of the disc; NVE neovascularization elsewhere; AREDS age related eye disease study; ARMD age related macular degeneration; POAG primary open angle glaucoma; EBMD epithelial/anterior basement membrane dystrophy; ACIOL anterior chamber intraocular lens; IOL intraocular lens; PCIOL posterior chamber intraocular lens; Phaco/IOL  phacoemulsification with intraocular lens placement; [place] photorefractive keratectomy; LASIK laser assisted in situ keratomileusis; HTN hypertension; DM diabetes mellitus; COPD chronic obstructive pulmonary disease

## 2019-10-15 ENCOUNTER — Other Ambulatory Visit: Payer: Self-pay | Admitting: Cardiology

## 2019-10-16 ENCOUNTER — Telehealth: Payer: Self-pay | Admitting: Cardiology

## 2019-10-16 NOTE — Telephone Encounter (Signed)
*  STAT* If patient is at the pharmacy, call can be transferred to refill team.   1. Which medications need to be refilled? (please list name of each medication and dose if known) XARELTO 20 MG TABS tablet  2. Which pharmacy/location (including street and city if local pharmacy) is medication to be sent to? [facility] [address] [place]  3. Do they need a 30 day or 90 day supply? 30 day  Patient needs 30 day refill not 90 day due to insurance. Patient is out of medication.  Patient's wife states he also needs the prescription to be sent to a pharmacy in [place], [place]. She states he is in the donut hole and last year a prescription was sent to [place] for him. She states he will need a 6 month open prescription. Pharmacy Phone: [phone], Email: [email or web address]

## 2019-10-17 DIAGNOSIS — R2689 Other abnormalities of gait and mobility: Secondary | ICD-10-CM | POA: Diagnosis not present

## 2019-10-17 DIAGNOSIS — S72002D Fracture of unspecified part of neck of left femur, subsequent encounter for closed fracture with routine healing: Secondary | ICD-10-CM | POA: Diagnosis not present

## 2019-10-18 MED ORDER — RIVAROXABAN 20 MG PO TABS: ORAL_TABLET | ORAL | 1 refills | Status: DC

## 2019-10-18 NOTE — Telephone Encounter (Signed)
Rx has been sent to the pharmacy electronically. ° °

## 2019-10-18 NOTE — Addendum Note (Signed)
Addended by: Allean Found on: 10/18/2019 01:24 PM   Modules accepted: Orders

## 2019-10-19 ENCOUNTER — Other Ambulatory Visit: Payer: Self-pay

## 2019-10-19 MED ORDER — RIVAROXABAN 20 MG PO TABS: ORAL_TABLET | ORAL | 6 refills | Status: DC

## 2019-10-19 NOTE — Telephone Encounter (Signed)
Patient's wife calling stating the refill was sent to [facility] for 90 days, but he needs it for 30 days. She states the 90 day prescription is too expensive. She would also like someone to call about having the prescription sent to [facility].

## 2019-10-25 ENCOUNTER — Other Ambulatory Visit: Payer: Self-pay | Admitting: Cardiology

## 2019-10-25 NOTE — Telephone Encounter (Signed)
OK to write this prescription.

## 2019-10-25 NOTE — Telephone Encounter (Signed)
Patient's wife calling. She is asking that a prescription for Xarelto 20 mg QD #90 be sent to the [facility] in [place]. She states that it is cheaper to get it through them. She states that Dr. Percival Spanish has done it in the past. She states that it should be sent to: [email or web address]. Their phone number is [phone]. The patient has not had any recent labs. He is scheduled to see Dr. Percival Spanish in October. He currently as a couple of week supply left. Made wife aware that I will forward to Dr. Percival Spanish and PharmD for approval.

## 2019-10-25 NOTE — Telephone Encounter (Signed)
Follow up:     Patient wife calling stating that the patient need some getting medication refill. They have not head from anyone. They called 10/16/19. Please call patient back.

## 2019-11-05 MED ORDER — RIVAROXABAN 20 MG PO TABS: ORAL_TABLET | ORAL | 3 refills | Status: DC

## 2019-11-05 NOTE — Telephone Encounter (Addendum)
Called Rx as requested, wife aware

## 2019-11-05 NOTE — Telephone Encounter (Signed)
This encounter was created in error - please disregard.

## 2019-11-08 ENCOUNTER — Encounter (INDEPENDENT_AMBULATORY_CARE_PROVIDER_SITE_OTHER): Payer: Medicare HMO | Admitting: Ophthalmology

## 2019-11-08 ENCOUNTER — Encounter (INDEPENDENT_AMBULATORY_CARE_PROVIDER_SITE_OTHER): Payer: Self-pay | Admitting: Ophthalmology

## 2019-11-08 ENCOUNTER — Ambulatory Visit (INDEPENDENT_AMBULATORY_CARE_PROVIDER_SITE_OTHER): Payer: Medicare HMO | Admitting: Ophthalmology

## 2019-11-08 ENCOUNTER — Other Ambulatory Visit: Payer: Self-pay

## 2019-11-08 DIAGNOSIS — H34811 Central retinal vein occlusion, right eye, with macular edema: Secondary | ICD-10-CM

## 2019-11-08 DIAGNOSIS — H353221 Exudative age-related macular degeneration, left eye, with active choroidal neovascularization: Secondary | ICD-10-CM | POA: Diagnosis not present

## 2019-11-08 MED ORDER — AFLIBERCEPT 2MG/0.05ML IZ SOLN FOR KALEIDOSCOPE
2.0000 mg | INTRAVITREAL | Status: AC | PRN
  Administered 2019-11-08: 2 mg via INTRAVITREAL

## 2019-11-08 NOTE — Progress Notes (Signed)
11/08/2019     CHIEF COMPLAINT Patient presents for Retina Follow Up   HISTORY OF PRESENT ILLNESS: Chad Russell is a 86 y.o. male who presents to the clinic today for:   HPI    Retina Follow Up    Patient presents with  Wet AMD.  In left eye.  Severity is moderate.  Duration of 4 weeks.  Since onset it is stable.          Comments    4 Week Wet AMD f\u OS. Possible Eylea OS. OCT  Pt feels OS vision has improved. Denies any complaints.       Last edited by Tilda Franco on 11/08/2019  1:46 PM. (History)      Referring physician: [name], MD [address] [place],  [place] [address]  HISTORICAL INFORMATION:   Selected notes from the MEDICAL RECORD NUMBER       CURRENT MEDICATIONS: Current Outpatient Medications (Ophthalmic Drugs)  Medication Sig  . timolol (TIMOPTIC) 0.5 % ophthalmic solution PLACE ONE DROP IN [facility] EYE ONCE DAILY  . timolol (TIMOPTIC) 0.5 % ophthalmic solution Place 1 drop into both eyes 2 (two) times daily.   No current facility-administered medications for this visit. (Ophthalmic Drugs)   Current Outpatient Medications (Other)  Medication Sig  . acetaminophen (TYLENOL) 500 MG tablet Take 1 tablet (500 mg total) by mouth every 8 (eight) hours.  Marland Kitchen amLODipine (NORVASC) 10 MG tablet Take 1 tablet (10 mg total) by mouth daily.  . bisacodyl (DULCOLAX) 5 MG EC tablet Take 1 tablet (5 mg total) by mouth daily as needed for moderate constipation.  . carbidopa-levodopa (SINEMET) 25-100 MG tablet Take 1.5 tablets by mouth in the morning and at bedtime.  . feeding supplement, ENSURE ENLIVE, (ENSURE ENLIVE) LIQD Take 237 mLs by mouth 2 (two) times daily between meals.  Marland Kitchen lisinopril (ZESTRIL) 20 MG tablet Take 1 tablet (20 mg total) by mouth daily. (Patient not taking: Reported on 02/21/2019)  . lisinopril-hydrochlorothiazide (ZESTORETIC) 10-12.5 MG tablet TAKE ONE TABLET BY MOUTH DAILY  . Misc Natural Products (PROSTATE) CAPS Take 1 tablet  daily by mouth. Prostate Plus otc supplement  . Multiple Vitamin (MULTIVITAMIN WITH MINERALS) TABS tablet Take 1 tablet daily by mouth.  . Multiple Vitamins-Minerals (PRESERVISION AREDS 2+MULTI VIT PO) Take 1 capsule by mouth 2 (two) times daily.  . rivaroxaban (XARELTO) 20 MG TABS tablet TAKE ONE TABLET BY MOUTH DAILY WITH SUPPER  . senna (SENOKOT) 8.6 MG TABS tablet Take 1 tablet (8.6 mg total) by mouth at bedtime as needed for mild constipation.  . vitamin C (ASCORBIC ACID) 500 MG tablet Take 500 mg by mouth daily.   No current facility-administered medications for this visit. (Other)      REVIEW OF SYSTEMS:    ALLERGIES Allergies  Allergen Reactions  . Doxycycline Other (See Comments)    Upset stomach  . Keflex [Cephalexin] Other (See Comments)    Abdominal discomfort 04-19-17 Pt has tolerated orally    PAST MEDICAL HISTORY Past Medical History:  Diagnosis Date  . Arthritis   . Atrial fibrillation ([place])   . Balance problem 10/31/2013  . BPH (benign prostatic hyperplasia) 2014   Urology Dr [name] with [facility] [phone]   . Bradycardia    a. baseline HR in the 30's. Asymptomatic - no history of PPM placement.   Marland Kitchen CAD (coronary artery disease)    LHC 03/16/11: Mid LAD 10-20%, proximal circumflex 10%, mid RCA 20%, EF 55%.  Marland Kitchen  Cataract   . Complete heart block ([place])   . Fracture of femoral neck, right ([place]) 04/19/2017  . GERD (gastroesophageal reflux disease)    takes occasional  zantac  . Hx of echocardiogram    Echo 02/2011: EF 65-70%, normal wall motion, MAC, mild MR, mild LAE, mild RVE  . Hypertension   . OSA (obstructive sleep apnea)    per patient went to have sleep study done about 2 years ago, bu they never F/U with him about results; but wife reports he still has periods of apnea at night   . Talar fracture    casted no surg   Past Surgical History:  Procedure Laterality Date  . ABDOMINAL AORTOGRAM W/LOWER EXTREMITY N/A 01/17/2017   Procedure: ABDOMINAL AORTOGRAM  W/LOWER EXTREMITY;  Surgeon: Angelia Mould, MD;  Location: [facility] [place] [facility];  Service: Cardiovascular;  Laterality: N/A;  . COLONOSCOPY     Repeated and normal in 2011  . FRACTURE SURGERY    . HEMIARTHROPLASTY HIP Right 04/19/2017  . HEMORROIDECTOMY    . HIP ARTHROPLASTY Right 04/19/2017   Procedure: ARTHROPLASTY BIPOLAR HIP (HEMIARTHROPLASTY);  Surgeon: Marchia Bond, MD;  Location: [place];  Service: Orthopedics;  Laterality: Right;  . HIP ARTHROPLASTY Left 12/16/2018   Procedure: ARTHROPLASTY BIPOLAR HIP (HEMIARTHROPLASTY);  Surgeon: Altamese [place] Park, MD;  Location: [place];  Service: Orthopedics;  Laterality: Left;  . INGUINAL HERNIA REPAIR Right 02/26/2014   Procedure: LAPAROSCOPIC RIGHT INGUINAL HERNIA REPAIR;  Surgeon: Ralene Ok, MD;  Location: [place];  Service: General;  Laterality: Right;  . INGUINAL HERNIA REPAIR    . INSERTION OF MESH Right 02/26/2014   Procedure: INSERTION OF MESH;  Surgeon: Ralene Ok, MD;  Location: [place];  Service: General;  Laterality: Right;  . LEFT HEART CATHETERIZATION WITH CORONARY ANGIOGRAM N/A 03/16/2011   Procedure: LEFT HEART CATHETERIZATION WITH CORONARY ANGIOGRAM;  Surgeon: Peter M Martinique, MD;  Location: [facility];  Service: Cardiovascular;  Laterality: N/A;  . PROSTATE BIOPSY     negative - [facility]  . TRANSURETHRAL RESECTION OF PROSTATE N/A 09/07/2016   Procedure: TRANSURETHRAL RESECTION OF THE PROSTATE (TURP);  Surgeon: Irine Seal, MD;  Location: [facility];  Service: Urology;  Laterality: N/A;    FAMILY HISTORY Family History  Problem Relation Age of Onset  . Hypertension Mother   . Multiple sclerosis Sister     SOCIAL HISTORY Social History   Tobacco Use  . Smoking status: Former Smoker    Packs/day: 1.00    Years: 3.00    Pack years: 3.00    Types: Cigarettes    Quit date: 03/17/1955    Years since quitting: 64.6  . Smokeless tobacco: Former Network engineer  . Vaping Use: Never used  Substance Use Topics  .  Alcohol use: Yes    Alcohol/week: 14.0 standard drinks    Types: 7 Cans of beer, 7 Standard drinks or equivalent per week    Comment: daily  . Drug use: No         OPHTHALMIC EXAM: Base Eye Exam    Visual Acuity (Snellen - Linear)      Right Left   Dist cc CF @ 4' 20/80   Dist ph cc NI NI   Correction: Glasses       Tonometry (Tonopen, 1:54 PM)      Right Left   Pressure 10 16       Pupils      Pupils Dark Light Shape React APD  Right PERRL 4 3 Round Sluggish None   Left PERRL 4 3 Round Sluggish None       Visual Fields (Counting fingers)      Left Right   Restrictions Partial outer superior temporal, inferior temporal, inferior nasal deficiencies Partial outer superior nasal, inferior nasal deficiencies       Neuro/Psych    Oriented x3: Yes   Mood/Affect: Normal       Dilation    Left eye: 1.0% Mydriacyl, 2.5% Phenylephrine @ 1:54 PM        Slit Lamp and Fundus Exam    External Exam      Right Left   External Normal Normal       Slit Lamp Exam      Right Left   Lids/Lashes Normal Normal   Conjunctiva/Sclera White and quiet White and quiet   Cornea Clear Clear   Anterior Chamber Deep and quiet Deep and quiet   Iris Round and reactive Round and reactive   Lens Posterior chamber intraocular lens Posterior chamber intraocular lens   Anterior Vitreous Normal Normal       Fundus Exam      Right Left   Posterior Vitreous  Posterior vitreous detachment   Disc  Normal   C/D Ratio  0.75   Macula  Geographic atrophy, Soft drusen, Subretinal hemorrhage smaller   Vessels  Normal   Periphery  Normal          IMAGING AND PROCEDURES  Imaging and Procedures for 11/08/19  OCT, Retina - OU - Both Eyes       Right Eye Quality was good. Scan locations included subfoveal. Progression has worsened. Findings include cystoid macular edema.   Left Eye Quality was good. Central Foveal Thickness: 307. Progression has improved. Findings include choroidal  neovascular membrane, intraretinal fluid, subretinal fluid.   Notes CME OD from CRV O has recurred somewhat at this current interval, 36 days post last injection.  Repeat injection in the right eye soon  Left eye post injection Eylea for wet ARMD, has stabilized and slightly improved and lesion has anatomically improved.       Intravitreal Injection, Pharmacologic Agent - OS - Left Eye       Time Out 11/08/2019. 2:54 PM. Confirmed correct patient, procedure, site, and patient consented.   Anesthesia Topical anesthesia was used. Anesthetic medications included Akten 3.5%.   Procedure Preparation included Tobramycin 0.3%, 10% betadine to eyelids, 5% betadine to ocular surface. A 30 gauge needle was used.   Injection:  2 mg aflibercept Alfonse Flavors) SOLN   NDC: [id number], Lot: [id number]   Route: Intravitreal, Site: Left Eye, Waste: 0 mg  Post-op Post injection exam found visual acuity of at least counting fingers. The patient tolerated the procedure well. There were no complications. The patient received written and verbal post procedure care education. Post injection medications were not given.                 ASSESSMENT/PLAN:  Exudative age-related macular degeneration of left eye with active choroidal neovascularization (HCC) At 1 month follow-up left eye, wet ARMD has improved status post injection #1 Eylea, will repeat today.  Patient notes an improvement in acuity and visual functioning  Central retinal vein occlusion with macular edema of right eye Follow-up right eye as scheduled      ICD-10-CM   1. Exudative age-related macular degeneration of left eye with active choroidal neovascularization (HCC)  H35.3221 OCT, Retina - OU - Both Eyes  Intravitreal Injection, Pharmacologic Agent - OS - Left Eye    aflibercept (EYLEA) SOLN 2 mg  2. Central retinal vein occlusion with macular edema of right eye  H34.8110     1.  Injection intravitreal Eylea OS today for wet  ARMD.  Follow-up in 5 weeks left eye  2.  Follow-up examination for CRV O with CME as scheduled  3.  Ophthalmic Meds Ordered this visit:  Meds ordered this encounter  Medications  . aflibercept (EYLEA) SOLN 2 mg       Return in about 5 weeks (around 12/13/2019) for dilate, OS, EYLEA OCT.  There are no Patient Instructions on file for this visit.   Explained the diagnoses, plan, and follow up with the patient and they expressed understanding.  Patient expressed understanding of the importance of proper follow up care.   Clent Demark Lia Vigilante M.D. Diseases & Surgery of the Retina and Vitreous [facility] [place] 11/08/19     Abbreviations: M myopia (nearsighted); A astigmatism; H hyperopia (farsighted); P presbyopia; Mrx spectacle prescription;  CTL contact lenses; OD right eye; OS left eye; OU both eyes  XT exotropia; ET esotropia; PEK punctate epithelial keratitis; PEE punctate epithelial erosions; DES dry eye syndrome; MGD meibomian gland dysfunction; ATs artificial tears; PFAT's preservative free artificial tears; [place] nuclear sclerotic cataract; PSC posterior subcapsular cataract; ERM epi-retinal membrane; PVD posterior vitreous detachment; RD retinal detachment; DM diabetes mellitus; DR diabetic retinopathy; NPDR non-proliferative diabetic retinopathy; PDR proliferative diabetic retinopathy; CSME clinically significant macular edema; DME diabetic macular edema; dbh dot blot hemorrhages; CWS cotton wool spot; POAG primary open angle glaucoma; C/D cup-to-disc ratio; HVF humphrey visual field; GVF goldmann visual field; OCT optical coherence tomography; IOP intraocular pressure; BRVO Branch retinal vein occlusion; CRVO central retinal vein occlusion; CRAO central retinal artery occlusion; BRAO branch retinal artery occlusion; RT retinal tear; SB scleral buckle; PPV pars plana vitrectomy; VH Vitreous hemorrhage; PRP panretinal laser photocoagulation; IVK intravitreal kenalog; VMT  vitreomacular traction; MH Macular hole;  NVD neovascularization of the disc; NVE neovascularization elsewhere; AREDS age related eye disease study; ARMD age related macular degeneration; POAG primary open angle glaucoma; EBMD epithelial/anterior basement membrane dystrophy; ACIOL anterior chamber intraocular lens; IOL intraocular lens; PCIOL posterior chamber intraocular lens; Phaco/IOL phacoemulsification with intraocular lens placement; [place] photorefractive keratectomy; LASIK laser assisted in situ keratomileusis; HTN hypertension; DM diabetes mellitus; COPD chronic obstructive pulmonary disease

## 2019-11-08 NOTE — Patient Instructions (Signed)
Instructed to notify the office promptly if new visual acuity declines or difficulties arise

## 2019-11-08 NOTE — Assessment & Plan Note (Signed)
At 1 month follow-up left eye, wet ARMD has improved status post injection #1 Eylea, will repeat today.  Patient notes an improvement in acuity and visual functioning

## 2019-11-08 NOTE — Assessment & Plan Note (Signed)
Follow-up right eye as scheduled 

## 2019-11-15 ENCOUNTER — Other Ambulatory Visit: Payer: Self-pay | Admitting: Cardiology

## 2019-11-26 ENCOUNTER — Other Ambulatory Visit: Payer: Self-pay

## 2019-11-26 ENCOUNTER — Encounter: Payer: Self-pay | Admitting: Emergency Medicine

## 2019-11-26 ENCOUNTER — Telehealth (INDEPENDENT_AMBULATORY_CARE_PROVIDER_SITE_OTHER): Payer: Medicare HMO | Admitting: Emergency Medicine

## 2019-11-26 DIAGNOSIS — R059 Cough, unspecified: Secondary | ICD-10-CM

## 2019-11-26 DIAGNOSIS — R0989 Other specified symptoms and signs involving the circulatory and respiratory systems: Secondary | ICD-10-CM

## 2019-11-26 NOTE — Progress Notes (Addendum)
Telemedicine Encounter- SOAP NOTE Established Patient Patient: Home  Provider: Office     This telephone encounter was conducted with the patient's (or proxy's) verbal consent via audio telecommunications: yes/no: Yes Patient was instructed to have this encounter in a suitably private space; and to only have persons present to whom they give permission to participate. In addition, patient identity was confirmed by use of name plus two identifiers (DOB and address).  I discussed the limitations, risks, security and privacy concerns of performing an evaluation and management service by telephone and the availability of in person appointments. I also discussed with the patient that there may be a patient responsible charge related to this service. The patient expressed understanding and agreed to proceed.  I spent a total of TIME; 0 MIN TO 60 MIN: 20 minutes talking with the patient or their proxy.  Chief Complaint  Patient presents with  . Cough    patient states that he was eating an apple before bed lastnight and felt like something was stuck in his windpipe and that could have been the problem.Per patient he is feeling much better today and his cough is not as bad.    Subjective   Chad Russell is a 86 y.o. male established patient. Telephone visit today for follow-up of choking spell that happened last night with a piece of apple.  Felt like something was stuck in his windpipe and started coughing.  Was able to relieve the obstruction and today he is feeling much better.  Asymptomatic.  No complaints today.  HPI   Patient Active Problem List   Diagnosis Date Noted  . Exudative age-related macular degeneration of left eye with active choroidal neovascularization ([place]) 10/02/2019  . Central retinal vein occlusion with macular edema of right eye 06/20/2019  . Central retinal vein occlusion with neovascularization of right eye 06/20/2019  . Nondiabetic proliferative retinopathy,  left 06/20/2019  . Ocular ischemic syndrome 06/20/2019  . Nondiabetic proliferative retinopathy, right 06/20/2019  . Retinal hemorrhage of left eye 06/20/2019  . Retinal hemorrhage of right eye 06/20/2019  . AKI (acute kidney injury) ([place]) 12/15/2018  . Hematoma of scalp   . Heart block   . Chest pain 05/04/2018  . Fracture of femoral neck, left ([place]) 04/19/2017  . MGUS (monoclonal gammopathy of unknown significance) 01/15/2017  . BPH with obstruction/lower urinary tract symptoms 09/07/2016  . Preoperative cardiovascular examination 08/24/2016  . Urinary retention 05/13/2016  . Balance problem 10/31/2013  . OSA (obstructive sleep apnea) 10/31/2013  . BPH (benign prostatic hyperplasia) 08/28/2012  . Atrial fibrillation ([place]) 06/08/2012  . Hyperplasia of prostate with lower urinary tract symptoms (LUTS) 04/28/2012  . Moderate malnutrition ([place]) 04/28/2012  . Infection of urinary tract - recurrent 04/26/2012  . NSTEMI (non-ST elevated myocardial infarction) ([place]) 03/15/2011  . Complete heart block ([place]) 03/15/2011  . HTN (hypertension) 03/15/2011    Past Medical History:  Diagnosis Date  . Arthritis   . Atrial fibrillation ([place])   . Balance problem 10/31/2013  . BPH (benign prostatic hyperplasia) 2014   Urology Dr [name] with [facility] [phone]   . Bradycardia    a. baseline HR in the 30's. Asymptomatic - no history of PPM placement.   Marland Kitchen CAD (coronary artery disease)    LHC 03/16/11: Mid LAD 10-20%, proximal circumflex 10%, mid RCA 20%, EF 55%.  . Cataract   . Complete heart block ([place])   . Fracture of femoral neck, right ([place]) 04/19/2017  . GERD (gastroesophageal reflux  disease)    takes occasional  zantac  . Hx of echocardiogram    Echo 02/2011: EF 65-70%, normal wall motion, MAC, mild MR, mild LAE, mild RVE  . Hypertension   . OSA (obstructive sleep apnea)    per patient went to have sleep study done about 2 years ago, bu they never F/U with him about results; but wife reports  he still has periods of apnea at night   . Talar fracture    casted no surg    Current Outpatient Medications  Medication Sig Dispense Refill  . acetaminophen (TYLENOL) 500 MG tablet Take 1 tablet (500 mg total) by mouth every 8 (eight) hours. 30 tablet 0  . amLODipine (NORVASC) 10 MG tablet Take 1 tablet (10 mg total) by mouth daily.    . bisacodyl (DULCOLAX) 5 MG EC tablet Take 1 tablet (5 mg total) by mouth daily as needed for moderate constipation. 30 tablet 0  . carbidopa-levodopa (SINEMET) 25-100 MG tablet Take 1.5 tablets by mouth in the morning and at bedtime. 270 tablet 3  . feeding supplement, ENSURE ENLIVE, (ENSURE ENLIVE) LIQD Take 237 mLs by mouth 2 (two) times daily between meals. 237 mL 12  . lisinopril-hydrochlorothiazide (ZESTORETIC) 10-12.5 MG tablet TAKE ONE TABLET BY MOUTH DAILY 90 tablet 2  . Misc Natural Products (PROSTATE) CAPS Take 1 tablet daily by mouth. Prostate Plus otc supplement    . Multiple Vitamin (MULTIVITAMIN WITH MINERALS) TABS tablet Take 1 tablet daily by mouth.    . Multiple Vitamins-Minerals (PRESERVISION AREDS 2+MULTI VIT PO) Take 1 capsule by mouth 2 (two) times daily.    . rivaroxaban (XARELTO) 20 MG TABS tablet TAKE ONE TABLET BY MOUTH DAILY WITH SUPPER 90 tablet 3  . senna (SENOKOT) 8.6 MG TABS tablet Take 1 tablet (8.6 mg total) by mouth at bedtime as needed for mild constipation. 120 tablet 0  . timolol (TIMOPTIC) 0.5 % ophthalmic solution PLACE ONE DROP IN EACH EYE ONCE DAILY 10 mL 5  . timolol (TIMOPTIC) 0.5 % ophthalmic solution Place 1 drop into both eyes 2 (two) times daily. 5 mL 0  . vitamin C (ASCORBIC ACID) 500 MG tablet Take 500 mg by mouth daily.    Marland Kitchen lisinopril (ZESTRIL) 20 MG tablet Take 1 tablet (20 mg total) by mouth daily. (Patient not taking: Reported on 02/21/2019)     No current facility-administered medications for this visit.    Allergies  Allergen Reactions  . Doxycycline Other (See Comments)    Upset stomach  . Keflex  [Cephalexin] Other (See Comments)    Abdominal discomfort 04-19-17 Pt has tolerated orally    Social History   Socioeconomic History  . Marital status: Married    Spouse name: Mardene Celeste  . Number of children: 0  . Years of education: 43  . Highest education level: Not on file  Occupational History  . Occupation: Scientist, product/process development  . Occupation: Civil engineer, contracting  Tobacco Use  . Smoking status: Former Smoker    Packs/day: 1.00    Years: 3.00    Pack years: 3.00    Types: Cigarettes    Quit date: 03/17/1955    Years since quitting: 64.7  . Smokeless tobacco: Former Network engineer  . Vaping Use: Never used  Substance and Sexual Activity  . Alcohol use: Yes    Alcohol/week: 14.0 standard drinks    Types: 7 Cans of beer, 7 Standard drinks or equivalent per week    Comment: daily  . Drug  use: No  . Sexual activity: Not Currently    Birth control/protection: None  Other Topics Concern  . 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  Has no children.     Right handed    Social Determinants of Health   Financial Resource Strain:   . Difficulty of Paying Living Expenses: Not on file  Food Insecurity:   . Worried About Charity fundraiser in the Last Year: Not on file  . Ran Out of Food in the Last Year: Not on file  Transportation Needs:   . Lack of Transportation (Medical): Not on file  . Lack of Transportation (Non-Medical): Not on file  Physical Activity:   . Days of Exercise per Week: Not on file  . Minutes of Exercise per Session: Not on file  Stress:   . Feeling of Stress : Not on file  Social Connections:   . Frequency of Communication with Friends and Family: Not on file  . Frequency of Social Gatherings with Friends and Family: Not on file  . Attends Religious Services: Not on file    . Active Member of Clubs or Organizations: Not on file  . Attends Archivist Meetings: Not on file  . Marital Status: Not on file  Intimate Partner Violence:   . Fear of Current or Ex-Partner: Not on file  . Emotionally Abused: Not on file  . Physically Abused: Not on file  . Sexually Abused: Not on file    Review of Systems  Constitutional: Negative.  Negative for chills and fever.  HENT: Negative.  Negative for congestion and sore throat.   Respiratory: Positive for cough. Negative for hemoptysis, shortness of breath and wheezing.   Cardiovascular: Negative.  Negative for chest pain and palpitations.  Gastrointestinal: Negative.  Negative for abdominal pain, nausea and vomiting.  Genitourinary: Negative.   Skin: Negative.  Negative for rash.  Neurological: Negative for dizziness, focal weakness, loss of consciousness and headaches.  All other systems reviewed and are negative.   Objective  Alert and oriented x3 in no apparent respiratory distress. Vitals as reported by the patient: There were no vitals filed for this visit.  There are no diagnoses linked to this encounter.  Asiah was seen today for cough.  Diagnoses and all orders for this visit:  Choking episode  Cough Comments: Much improved  Clinically stable.  No medical concerns identified during this visit.  Advised to contact the office if condition changes or worsens over the next several days.  I discussed the assessment and treatment plan with the patient. The patient was provided an opportunity to ask questions and all were answered. The patient agreed with the plan and demonstrated an understanding of the instructions.   The patient was advised to call back or seek an in-person evaluation if the symptoms worsen or if the condition fails to improve as anticipated.  I provided 20 minutes of non-face-to-face time during this encounter.  Horald Pollen, MD  Primary Care at [facility]

## 2019-11-26 NOTE — Patient Instructions (Signed)
° ° ° °  If you have lab work done today you will be contacted with your lab results within the next 2 weeks.  If you have not heard from us then please contact us. The fastest way to get your results is to register for My Chart. ° ° °IF you received an x-ray today, you will receive an invoice from [facility]. Please contact [facility] at [phone] with questions or concerns regarding your invoice.  ° °IF you received labwork today, you will receive an invoice from [facility]. Please contact [facility] at [phone] with questions or concerns regarding your invoice.  ° °Our billing staff will not be able to assist you with questions regarding bills from these companies. ° °You will be contacted with the lab results as soon as they are available. The fastest way to get your results is to activate your My Chart account. Instructions are located on the last page of this paperwork. If you have not heard from us regarding the results in 2 weeks, please contact this office. °  ° ° ° °

## 2019-11-27 ENCOUNTER — Ambulatory Visit (INDEPENDENT_AMBULATORY_CARE_PROVIDER_SITE_OTHER): Payer: Medicare HMO | Admitting: Ophthalmology

## 2019-11-27 ENCOUNTER — Encounter (INDEPENDENT_AMBULATORY_CARE_PROVIDER_SITE_OTHER): Payer: Self-pay | Admitting: Ophthalmology

## 2019-11-27 ENCOUNTER — Encounter (INDEPENDENT_AMBULATORY_CARE_PROVIDER_SITE_OTHER): Payer: Medicare HMO | Admitting: Ophthalmology

## 2019-11-27 DIAGNOSIS — H34811 Central retinal vein occlusion, right eye, with macular edema: Secondary | ICD-10-CM

## 2019-11-27 MED ORDER — AFLIBERCEPT 2MG/0.05ML IZ SOLN FOR KALEIDOSCOPE
2.0000 mg | INTRAVITREAL | Status: AC | PRN
  Administered 2019-11-27: 2 mg via INTRAVITREAL

## 2019-11-27 NOTE — Assessment & Plan Note (Signed)
CME overall still active OD from CRV O.  Does improve 1 to 2 weeks after injection yet is only stabilized at 8-week interval.  We will repeat Eylea today to maintain

## 2019-11-27 NOTE — Progress Notes (Signed)
11/27/2019     CHIEF COMPLAINT Patient presents for Retina Follow Up   HISTORY OF PRESENT ILLNESS: Chad Russell is a 86 y.o. male who presents to the clinic today for:   HPI    Retina Follow Up    Patient presents with  CRVO/BRVO.  In right eye.  Severity is moderate.  Duration of 8 weeks.  Since onset it is stable.  I, the attending physician,  performed the HPI with the patient and updated documentation appropriately.          Comments    8 Week CRVO f\u OD. Possible Eylea OD. OCT.  Pt states no changes in vision. Denies any new complaints.       Last edited by Tilda Franco on 11/27/2019  1:20 PM. (History)      Referring physician: [name], MD [address] [place],  [place] [address]  HISTORICAL INFORMATION:   Selected notes from the MEDICAL RECORD NUMBER       CURRENT MEDICATIONS: Current Outpatient Medications (Ophthalmic Drugs)  Medication Sig  . timolol (TIMOPTIC) 0.5 % ophthalmic solution PLACE ONE DROP IN [facility] EYE ONCE DAILY  . timolol (TIMOPTIC) 0.5 % ophthalmic solution Place 1 drop into both eyes 2 (two) times daily.   No current facility-administered medications for this visit. (Ophthalmic Drugs)   Current Outpatient Medications (Other)  Medication Sig  . acetaminophen (TYLENOL) 500 MG tablet Take 1 tablet (500 mg total) by mouth every 8 (eight) hours.  Marland Kitchen amLODipine (NORVASC) 10 MG tablet Take 1 tablet (10 mg total) by mouth daily.  . bisacodyl (DULCOLAX) 5 MG EC tablet Take 1 tablet (5 mg total) by mouth daily as needed for moderate constipation.  . carbidopa-levodopa (SINEMET) 25-100 MG tablet Take 1.5 tablets by mouth in the morning and at bedtime.  . feeding supplement, ENSURE ENLIVE, (ENSURE ENLIVE) LIQD Take 237 mLs by mouth 2 (two) times daily between meals.  Marland Kitchen lisinopril (ZESTRIL) 20 MG tablet Take 1 tablet (20 mg total) by mouth daily. (Patient not taking: Reported on 02/21/2019)  . lisinopril-hydrochlorothiazide  (ZESTORETIC) 10-12.5 MG tablet TAKE ONE TABLET BY MOUTH DAILY  . Misc Natural Products (PROSTATE) CAPS Take 1 tablet daily by mouth. Prostate Plus otc supplement  . Multiple Vitamin (MULTIVITAMIN WITH MINERALS) TABS tablet Take 1 tablet daily by mouth.  . Multiple Vitamins-Minerals (PRESERVISION AREDS 2+MULTI VIT PO) Take 1 capsule by mouth 2 (two) times daily.  . rivaroxaban (XARELTO) 20 MG TABS tablet TAKE ONE TABLET BY MOUTH DAILY WITH SUPPER  . senna (SENOKOT) 8.6 MG TABS tablet Take 1 tablet (8.6 mg total) by mouth at bedtime as needed for mild constipation.  . vitamin C (ASCORBIC ACID) 500 MG tablet Take 500 mg by mouth daily.   No current facility-administered medications for this visit. (Other)      REVIEW OF SYSTEMS:    ALLERGIES Allergies  Allergen Reactions  . Doxycycline Other (See Comments)    Upset stomach  . Keflex [Cephalexin] Other (See Comments)    Abdominal discomfort 04-19-17 Pt has tolerated orally    PAST MEDICAL HISTORY Past Medical History:  Diagnosis Date  . Arthritis   . Atrial fibrillation ([place])   . Balance problem 10/31/2013  . BPH (benign prostatic hyperplasia) 2014   Urology Dr [name] with [facility] [phone]   . Bradycardia    a. baseline HR in the 30's. Asymptomatic - no history of PPM placement.   Marland Kitchen CAD (coronary artery disease)  LHC 03/16/11: Mid LAD 10-20%, proximal circumflex 10%, mid RCA 20%, EF 55%.  . Cataract   . Complete heart block ([place])   . Fracture of femoral neck, right ([place]) 04/19/2017  . GERD (gastroesophageal reflux disease)    takes occasional  zantac  . Hx of echocardiogram    Echo 02/2011: EF 65-70%, normal wall motion, MAC, mild MR, mild LAE, mild RVE  . Hypertension   . OSA (obstructive sleep apnea)    per patient went to have sleep study done about 2 years ago, bu they never F/U with him about results; but wife reports he still has periods of apnea at night   . Talar fracture    casted no surg   Past Surgical History:    Procedure Laterality Date  . ABDOMINAL AORTOGRAM W/LOWER EXTREMITY N/A 01/17/2017   Procedure: ABDOMINAL AORTOGRAM W/LOWER EXTREMITY;  Surgeon: Angelia Mould, MD;  Location: [facility];  Service: Cardiovascular;  Laterality: N/A;  . COLONOSCOPY     Repeated and normal in 2011  . FRACTURE SURGERY    . HEMIARTHROPLASTY HIP Right 04/19/2017  . HEMORROIDECTOMY    . HIP ARTHROPLASTY Right 04/19/2017   Procedure: ARTHROPLASTY BIPOLAR HIP (HEMIARTHROPLASTY);  Surgeon: Marchia Bond, MD;  Location: [place];  Service: Orthopedics;  Laterality: Right;  . HIP ARTHROPLASTY Left 12/16/2018   Procedure: ARTHROPLASTY BIPOLAR HIP (HEMIARTHROPLASTY);  Surgeon: Altamese [place], MD;  Location: [place];  Service: Orthopedics;  Laterality: Left;  . INGUINAL HERNIA REPAIR Right 02/26/2014   Procedure: LAPAROSCOPIC RIGHT INGUINAL HERNIA REPAIR;  Surgeon: Ralene Ok, MD;  Location: [place];  Service: General;  Laterality: Right;  . INGUINAL HERNIA REPAIR    . INSERTION OF MESH Right 02/26/2014   Procedure: INSERTION OF MESH;  Surgeon: Ralene Ok, MD;  Location: [place];  Service: General;  Laterality: Right;  . LEFT HEART CATHETERIZATION WITH CORONARY ANGIOGRAM N/A 03/16/2011   Procedure: LEFT HEART CATHETERIZATION WITH CORONARY ANGIOGRAM;  Surgeon: Peter M Martinique, MD;  Location: [facility] CATH LAB;  Service: Cardiovascular;  Laterality: N/A;  . PROSTATE BIOPSY     negative - [facility]  . TRANSURETHRAL RESECTION OF PROSTATE N/A 09/07/2016   Procedure: TRANSURETHRAL RESECTION OF THE PROSTATE (TURP);  Surgeon: Irine Seal, MD;  Location: [facility];  Service: Urology;  Laterality: N/A;    FAMILY HISTORY Family History  Problem Relation Age of Onset  . Hypertension Mother   . Multiple sclerosis Sister     SOCIAL HISTORY Social History   Tobacco Use  . Smoking status: Former Smoker    Packs/day: 1.00    Years: 3.00    Pack years: 3.00    Types: Cigarettes    Quit date: 03/17/1955    Years  since quitting: 64.7  . Smokeless tobacco: Former Network engineer  . Vaping Use: Never used  Substance Use Topics  . Alcohol use: Yes    Alcohol/week: 14.0 standard drinks    Types: 7 Cans of beer, 7 Standard drinks or equivalent per week    Comment: daily  . Drug use: No         OPHTHALMIC EXAM: Base Eye Exam    Visual Acuity (Snellen - Linear)      Right Left   Dist cc E Card @ 3' 20/80 -2   Dist ph cc NI NI   Correction: Glasses       Tonometry (Tonopen, 1:27 PM)      Right Left   Pressure 12 15  Pupils      Pupils Dark Light Shape React APD   Right PERRL 4 3 Round Sluggish None   Left PERRL 4 3 Round Sluggish None       Visual Fields (Counting fingers)      Left Right   Restrictions Partial outer inferior temporal, superior nasal, inferior nasal deficiencies Partial outer superior nasal, inferior nasal deficiencies       Neuro/Psych    Oriented x3: Yes   Mood/Affect: Normal       Dilation    Right eye: 1.0% Mydriacyl, 2.5% Phenylephrine @ 1:27 PM        Slit Lamp and Fundus Exam    External Exam      Right Left   External Normal Normal       Slit Lamp Exam      Right Left   Lids/Lashes Normal Normal   Conjunctiva/Sclera White and quiet White and quiet   Cornea Clear Clear   Anterior Chamber Deep and quiet Deep and quiet   Iris Round and reactive Round and reactive   Lens Posterior chamber intraocular lens Posterior chamber intraocular lens   Anterior Vitreous Normal Normal       Fundus Exam      Right Left   Posterior Vitreous Posterior vitreous detachment    Disc Normal    C/D Ratio 0.5    Macula Macular thickening, Cystoid macular edema, Microaneurysms, Atrophy, Retinal pigment epithelial atrophy, Drusen, Soft drusen, Intraretinal hemorrhage    Vessels Old central retinal vein occlusion intraretinal hemorrhages    Periphery Normal           IMAGING AND PROCEDURES  Imaging and Procedures for 11/27/19  OCT, Retina - OU -  Both Eyes       Right Eye Quality was good. Scan locations included subfoveal. Central Foveal Thickness: 838. Findings include cystoid macular edema.   Left Eye Quality was good. Scan locations included subfoveal. Central Foveal Thickness: 265. Findings include inner retinal atrophy, central retinal atrophy, no IRF, no SRF.        Intravitreal Injection, Pharmacologic Agent - OD - Right Eye       Time Out 11/27/2019. 1:47 PM. Confirmed correct patient, procedure, site, and patient consented.   Anesthesia Topical anesthesia was used. Anesthetic medications included Akten 3.5%.   Procedure Preparation included Ofloxacin , 5% betadine to ocular surface, 10% betadine to eyelids. A 30 gauge needle was used.   Injection:  2 mg aflibercept Alfonse Flavors) SOLN   NDC: [id number], Lot: [id number]   Route: Intravitreal, Site: Right Eye, Waste: 0 mg  Post-op Post injection exam found visual acuity of at least counting fingers. The patient tolerated the procedure well. There were no complications. The patient received written and verbal post procedure care education. Post injection medications were not given.                 ASSESSMENT/PLAN:  Central retinal vein occlusion with macular edema of right eye CME overall still active OD from CRV O.  Does improve 1 to 2 weeks after injection yet is only stabilized at 8-week interval.  We will repeat Eylea today to maintain      ICD-10-CM   1. Central retinal vein occlusion with macular edema of right eye  H34.8110 OCT, Retina - OU - Both Eyes    Intravitreal Injection, Pharmacologic Agent - OD - Right Eye    aflibercept (EYLEA) SOLN 2 mg    1.  CRV O with CME,  which improved typically 2 weeks post injection Eylea yet stable visual acuity only.  We will maintain 8-week interval to maintain and allow for collateralization of CRV O to progress.  Injection Eylea today and examination right IN 8 weeks  2.  OS as scheduled  3.  Ophthalmic  Meds Ordered this visit:  Meds ordered this encounter  Medications  . aflibercept (EYLEA) SOLN 2 mg       Return in about 8 weeks (around 01/22/2020) for EYLEA OCT, dilate, OD.  There are no Patient Instructions on file for this visit.   Explained the diagnoses, plan, and follow up with the patient and they expressed understanding.  Patient expressed understanding of the importance of proper follow up care.   Clent Demark Lindey Renzulli M.D. Diseases & Surgery of the Retina and Vitreous [facility]ina [facility] Diabetic [place] 11/27/19     Abbreviations: M myopia (nearsighted); A astigmatism; H hyperopia (farsighted); P presbyopia; Mrx spectacle prescription;  CTL contact lenses; OD right eye; OS left eye; OU both eyes  XT exotropia; ET esotropia; PEK punctate epithelial keratitis; PEE punctate epithelial erosions; DES dry eye syndrome; MGD meibomian gland dysfunction; ATs artificial tears; PFAT's preservative free artificial tears; [place] nuclear sclerotic cataract; PSC posterior subcapsular cataract; ERM epi-retinal membrane; PVD posterior vitreous detachment; RD retinal detachment; DM diabetes mellitus; DR diabetic retinopathy; NPDR non-proliferative diabetic retinopathy; PDR proliferative diabetic retinopathy; CSME clinically significant macular edema; DME diabetic macular edema; dbh dot blot hemorrhages; CWS cotton wool spot; POAG primary open angle glaucoma; C/D cup-to-disc ratio; HVF humphrey visual field; GVF goldmann visual field; OCT optical coherence tomography; IOP intraocular pressure; BRVO Branch retinal vein occlusion; CRVO central retinal vein occlusion; CRAO central retinal artery occlusion; BRAO branch retinal artery occlusion; RT retinal tear; SB scleral buckle; PPV pars plana vitrectomy; VH Vitreous hemorrhage; PRP panretinal laser photocoagulation; IVK intravitreal kenalog; VMT vitreomacular traction; MH Macular hole;  NVD neovascularization of the disc; NVE neovascularization elsewhere; AREDS age  related eye disease study; ARMD age related macular degeneration; POAG primary open angle glaucoma; EBMD epithelial/anterior basement membrane dystrophy; ACIOL anterior chamber intraocular lens; IOL intraocular lens; PCIOL posterior chamber intraocular lens; Phaco/IOL phacoemulsification with intraocular lens placement; [place] photorefractive keratectomy; LASIK laser assisted in situ keratomileusis; HTN hypertension; DM diabetes mellitus; COPD chronic obstructive pulmonary disease

## 2019-11-29 DIAGNOSIS — Z7189 Other specified counseling: Secondary | ICD-10-CM | POA: Insufficient documentation

## 2019-11-29 NOTE — Progress Notes (Signed)
Cardiology Office Note   Date:  11/30/2019   ID:  Chad, Russell April 25, 1926, MRN [id number]  PCP:  Chad Agreste, MD  Cardiologist:   Minus Breeding, MD   Chief Complaint  Patient presents with  . Atrial Fibrillation      History of Present Illness: Chad Russell is a 86 y.o. male who followup of CHB and atrial fib.  Since I last saw him he had hip surgery.   He has now had both hips operated on.  He gets around very slowly with a walker because of hip pain.  He does not feel palpitations, presyncope syncope.  He does not have any chest pressure, neck or arm discomfort.  He has no weight gain or edema.  He is not eating as much as his wife would like.  Past Medical History:  Diagnosis Date  . Arthritis   . Atrial fibrillation ([place])   . Balance problem 10/31/2013  . BPH (benign prostatic hyperplasia) 2014   Urology Dr [name] with [facility] [phone]   . Bradycardia    a. baseline HR in the 30's. Asymptomatic - no history of PPM placement.   Marland Kitchen CAD (coronary artery disease)    LHC 03/16/11: Mid LAD 10-20%, proximal circumflex 10%, mid RCA 20%, EF 55%.  . Cataract   . Complete heart block ([place])   . Fracture of femoral neck, right ([place]) 04/19/2017  . GERD (gastroesophageal reflux disease)    takes occasional  zantac  . Hx of echocardiogram    Echo 02/2011: EF 65-70%, normal wall motion, MAC, mild MR, mild LAE, mild RVE  . Hypertension   . OSA (obstructive sleep apnea)    per patient went to have sleep study done about 2 years ago, bu they never F/U with him about results; but wife reports he still has periods of apnea at night   . Talar fracture    casted no surg    Past Surgical History:  Procedure Laterality Date  . ABDOMINAL AORTOGRAM W/LOWER EXTREMITY N/A 01/17/2017   Procedure: ABDOMINAL AORTOGRAM W/LOWER EXTREMITY;  Surgeon: Angelia Mould, MD;  Location: [place] [facility];  Service: Cardiovascular;  Laterality: N/A;  . COLONOSCOPY     Repeated  and normal in 2011  . FRACTURE SURGERY    . HEMIARTHROPLASTY HIP Right 04/19/2017  . HEMORROIDECTOMY    . HIP ARTHROPLASTY Right 04/19/2017   Procedure: ARTHROPLASTY BIPOLAR HIP (HEMIARTHROPLASTY);  Surgeon: Marchia Bond, MD;  Location: [place];  Service: Orthopedics;  Laterality: Right;  . HIP ARTHROPLASTY Left 12/16/2018   Procedure: ARTHROPLASTY BIPOLAR HIP (HEMIARTHROPLASTY);  Surgeon: Altamese [place], MD;  Location: [place];  Service: Orthopedics;  Laterality: Left;  . INGUINAL HERNIA REPAIR Right 02/26/2014   Procedure: LAPAROSCOPIC RIGHT INGUINAL HERNIA REPAIR;  Surgeon: Ralene Ok, MD;  Location: [place];  Service: General;  Laterality: Right;  . INGUINAL HERNIA REPAIR    . INSERTION OF MESH Right 02/26/2014   Procedure: INSERTION OF MESH;  Surgeon: Ralene Ok, MD;  Location: [place];  Service: General;  Laterality: Right;  . LEFT HEART CATHETERIZATION WITH CORONARY ANGIOGRAM N/A 03/16/2011   Procedure: LEFT HEART CATHETERIZATION WITH CORONARY ANGIOGRAM;  Surgeon: Peter M Martinique, MD;  Location[facility] CATH LAB;  Service: Cardiovascular;  Laterality: N/A;  . PROSTATE BIOPSY     negative - [facility]  . TRANSURETHRAL RESECTION OF PROSTATE N/A 09/07/2016   Procedure: TRANSURETHRAL RESECTION OF THE PROSTATE (TURP);  Surgeon: Irine Seal, MD;  Location: [facility];  Service: Urology;  Laterality: N/A;     Current Outpatient Medications  Medication Sig Dispense Refill  . acetaminophen (TYLENOL) 500 MG tablet Take 1 tablet (500 mg total) by mouth every 8 (eight) hours. 30 tablet 0  . bisacodyl (DULCOLAX) 5 MG EC tablet Take 1 tablet (5 mg total) by mouth daily as needed for moderate constipation. 30 tablet 0  . carbidopa-levodopa (SINEMET) 25-100 MG tablet Take 1.5 tablets by mouth in the morning and at bedtime. 270 tablet 3  . lisinopril-hydrochlorothiazide (ZESTORETIC) 10-12.5 MG tablet TAKE ONE TABLET BY MOUTH DAILY 90 tablet 2  . Misc Natural Products (PROSTATE) CAPS Take 1 tablet daily by  mouth. Prostate Plus otc supplement    . Multiple Vitamin (MULTIVITAMIN WITH MINERALS) TABS tablet Take 1 tablet daily by mouth.    . Multiple Vitamins-Minerals (PRESERVISION AREDS 2+MULTI VIT PO) Take 1 capsule by mouth 2 (two) times daily.    . rivaroxaban (XARELTO) 20 MG TABS tablet TAKE ONE TABLET BY MOUTH DAILY WITH SUPPER 90 tablet 3  . senna (SENOKOT) 8.6 MG TABS tablet Take 1 tablet by mouth daily as needed for mild constipation.    . timolol (TIMOPTIC) 0.5 % ophthalmic solution PLACE ONE DROP IN [facility] EYE ONCE DAILY 10 mL 5  . vitamin C (ASCORBIC ACID) 500 MG tablet Take 500 mg by mouth daily.     No current facility-administered medications for this visit.    Allergies:   Doxycycline and Keflex [cephalexin]   ROS:  Please see the history of present illness.   Otherwise, review of systems are positive for none.   All other systems are reviewed and negative.    PHYSICAL EXAM: VS:  BP 100/60   Pulse (!) 32   Temp (!) 97 F (36.1 C)   Ht _0  (1.854 m)   Wt 175 lb (79.4 kg)   SpO2 98%   BMI 23.09 kg/m  , BMI Body mass index is 23.09 kg/m. GEN:  No distress but frail appearing NECK:  No jugular venous distention at 90 degrees, waveform within normal limits, carotid upstroke brisk and symmetric, no bruits, no thyromegaly LYMPHATICS:  No cervical adenopathy LUNGS:  Clear to auscultation bilaterally BACK:  No CVA tenderness CHEST:  Unremarkable HEART:  S1 and S2 within normal limits, no S3, no S4, no clicks, no rubs, no murmurs ABD:  Positive bowel sounds normal in frequency in pitch, no bruits, no rebound, no guarding, unable to assess midline mass or bruit with the patient seated. EXT:  2 plus pulses upper and decreased dorsalis pedis and posterior tibialis bilateral, trace edema, no cyanosis no clubbing SKIN:  No rashes no nodules NEURO:  Cranial nerves II through XII grossly intact, motor grossly intact throughout PSYCH:  Cognitively intact, oriented to person place and  time   EKG:  EKG is not ordered today.    Recent Labs: 12/19/2018: Magnesium 2.0 12/20/2018: Hemoglobin 10.1; Platelets 197 12/24/2018: BUN 21; Creatinine, Ser 0.81; Potassium 4.2; Sodium 142    Lipid Panel    Component Value Date/Time   CHOL 147 11/09/2016 1746   TRIG 55 11/09/2016 1746   HDL 71 11/09/2016 1746   CHOLHDL 2.1 11/09/2016 1746   CHOLHDL 2.4 03/16/2011 0510   VLDL 11 03/16/2011 0510   LDLCALC 65 11/09/2016 1746      Wt Readings from Last 3 Encounters:  11/30/19 175 lb (79.4 kg)  02/21/19 173 lb (78.5 kg)  12/15/18 173 lb 5.9 oz (78.6 kg)  Other studies Reviewed: Additional studies/ records that were reviewed today include: Called pharmacy to go over the meds. .   (Greater than 40 minutes reviewing all data with greater than 50% face to face with the patient). Review of the above records demonstrates:  Please see elsewhere in the note.     ASSESSMENT AND PLAN:  ATRIAL FIBRILLATION:   Chad Russell has a CHA2DS2 - VASc score of 3.   He tolerates anticoagulation.  No change in therapy.  COMPLETE HEART BLOCK:   He has had this for many years and we decided a long time ago not to put in a pacemaker as he was completely asymptomatic.  He has tolerated all of these years and given advanced age and frailty I would not consider this prophylactically now.  However, should he develop symptomatic bradycardia arrhythmias in the future we could still consider it.   HTN:   His blood pressure is at target.  No change in therapy.  COVID EDUCATION: He has been vaccinated.   Current medicines are reviewed at length with the patient today.  The patient does not have concerns regarding medicines.  The following changes have been made:  no change  Labs/ tests ordered today include: None No orders of the defined types were placed in this encounter.    Disposition:   FU with me in one year.     Signed, Minus Breeding, MD  11/30/2019 3:23 PM    [facility]

## 2019-11-30 ENCOUNTER — Encounter: Payer: Self-pay | Admitting: Cardiology

## 2019-11-30 ENCOUNTER — Other Ambulatory Visit: Payer: Self-pay

## 2019-11-30 ENCOUNTER — Ambulatory Visit: Payer: Medicare HMO | Admitting: Cardiology

## 2019-11-30 VITALS — BP 100/60 | HR 32 | Temp 97.0°F | Ht 73.0 in | Wt 175.0 lb

## 2019-11-30 DIAGNOSIS — I10 Essential (primary) hypertension: Secondary | ICD-10-CM

## 2019-11-30 DIAGNOSIS — I442 Atrioventricular block, complete: Secondary | ICD-10-CM | POA: Diagnosis not present

## 2019-11-30 DIAGNOSIS — Z7189 Other specified counseling: Secondary | ICD-10-CM

## 2019-11-30 DIAGNOSIS — I482 Chronic atrial fibrillation, unspecified: Secondary | ICD-10-CM

## 2019-11-30 NOTE — Patient Instructions (Signed)
Medication Instructions:  Your physician recommends that you continue on your current medications as directed. Please refer to the Current Medication list given to you today.  *If you need a refill on your cardiac medications before your next appointment, please call your pharmacy*   Follow-Up: At [facility], you and your health needs are our priority.  As part of our continuing mission to provide you with exceptional heart care, we have created designated Provider Care Teams.  These Care Teams include your primary Cardiologist (physician) and Advanced Practice Providers (APPs -  Physician Assistants and Nurse Practitioners) who all work together to provide you with the care you need, when you need it.  We recommend signing up for the patient portal called "MyChart".  Sign up information is provided on this After Visit Summary.  MyChart is used to connect with patients for Virtual Visits (Telemedicine).  Patients are able to view lab/test results, encounter notes, upcoming appointments, etc.  Non-urgent messages can be sent to your provider as well.   To learn more about what you can do with MyChart, go to [email or web address].    Your next appointment:   12 month(s)  The format for your next appointment:   In Person  Provider:   You may see Minus Breeding, MD or one of the following Advanced Practice Providers on your designated Care Team:    Rosaria Ferries, PA-C  Jory Sims, DNP, ANP    Other Instructions

## 2019-12-05 DIAGNOSIS — S72002D Fracture of unspecified part of neck of left femur, subsequent encounter for closed fracture with routine healing: Secondary | ICD-10-CM | POA: Diagnosis not present

## 2019-12-13 ENCOUNTER — Other Ambulatory Visit: Payer: Self-pay

## 2019-12-13 ENCOUNTER — Encounter (INDEPENDENT_AMBULATORY_CARE_PROVIDER_SITE_OTHER): Payer: Self-pay | Admitting: Ophthalmology

## 2019-12-13 ENCOUNTER — Ambulatory Visit (INDEPENDENT_AMBULATORY_CARE_PROVIDER_SITE_OTHER): Payer: Medicare HMO | Admitting: Ophthalmology

## 2019-12-13 DIAGNOSIS — H34811 Central retinal vein occlusion, right eye, with macular edema: Secondary | ICD-10-CM

## 2019-12-13 DIAGNOSIS — H353221 Exudative age-related macular degeneration, left eye, with active choroidal neovascularization: Secondary | ICD-10-CM | POA: Diagnosis not present

## 2019-12-13 MED ORDER — AFLIBERCEPT 2MG/0.05ML IZ SOLN FOR KALEIDOSCOPE
2.0000 mg | INTRAVITREAL | Status: AC | PRN
  Administered 2019-12-13: 2 mg via INTRAVITREAL

## 2019-12-13 NOTE — Assessment & Plan Note (Signed)
Vastly improved CME 16 days post intravitreal antivegF injection

## 2019-12-13 NOTE — Assessment & Plan Note (Signed)
The nature of wet macular degeneration was discussed with the patient.  Forms of therapy reviewed include the use of Anti-VEGF medications injected painlessly into the eye, as well as other possible treatment modalities, including thermal laser therapy. Fellow eye involvement and risks were discussed with the patient. Upon the finding of wet age related macular degeneration, treatment will be offered. The treatment regimen is on a treat as needed basis with the intent to treat if necessary and extend interval of exams when possible. On average 1 out of 6 patients do not need lifetime therapy. However, the risk of recurrent disease is high for a lifetime.  Initially monthly, then periodic, examinations and evaluations will determine whether the next treatment is required on the day of the examination.  OS, much improved post Eylea will repeat today and examination in 5 to 6 weeks

## 2019-12-13 NOTE — Progress Notes (Addendum)
12/13/2019     CHIEF COMPLAINT Patient presents for Retina Follow Up   HISTORY OF PRESENT ILLNESS: Chad Russell is a 86 y.o. male who presents to the clinic today for:   HPI    Retina Follow Up    Patient presents with  Wet AMD.  In left eye.  This started 5 weeks ago.  Severity is mild.  Duration of 5 weeks.  Since onset it is stable.          Comments    5 Week AMD F/U OS, poss Eylea OS  Pt denies noticeable changes to [place] OU since last visit. Pt denies ocular pain, flashes of light, or floaters OU.         Last edited by Rockie Neighbours, [place] on 12/13/2019  2:09 PM. (History)      Referring physician: [name], MD [address] [place],  [place] [address]  HISTORICAL INFORMATION:   Selected notes from the MEDICAL RECORD NUMBER       CURRENT MEDICATIONS: Current Outpatient Medications (Ophthalmic Drugs)  Medication Sig  . timolol (TIMOPTIC) 0.5 % ophthalmic solution PLACE ONE DROP IN [facility] EYE ONCE DAILY   No current facility-administered medications for this visit. (Ophthalmic Drugs)   Current Outpatient Medications (Other)  Medication Sig  . acetaminophen (TYLENOL) 500 MG tablet Take 1 tablet (500 mg total) by mouth every 8 (eight) hours.  . bisacodyl (DULCOLAX) 5 MG EC tablet Take 1 tablet (5 mg total) by mouth daily as needed for moderate constipation.  . carbidopa-levodopa (SINEMET) 25-100 MG tablet Take 1.5 tablets by mouth in the morning and at bedtime.  Marland Kitchen lisinopril-hydrochlorothiazide (ZESTORETIC) 10-12.5 MG tablet TAKE ONE TABLET BY MOUTH DAILY  . Misc Natural Products (PROSTATE) CAPS Take 1 tablet daily by mouth. Prostate Plus otc supplement  . Multiple Vitamin (MULTIVITAMIN WITH MINERALS) TABS tablet Take 1 tablet daily by mouth.  . Multiple Vitamins-Minerals (PRESERVISION AREDS 2+MULTI VIT PO) Take 1 capsule by mouth 2 (two) times daily.  . rivaroxaban (XARELTO) 20 MG TABS tablet TAKE ONE TABLET BY MOUTH DAILY WITH SUPPER  . senna  (SENOKOT) 8.6 MG TABS tablet Take 1 tablet by mouth daily as needed for mild constipation.  . vitamin C (ASCORBIC ACID) 500 MG tablet Take 500 mg by mouth daily.   No current facility-administered medications for this visit. (Other)      REVIEW OF SYSTEMS:    ALLERGIES Allergies  Allergen Reactions  . Doxycycline Other (See Comments)    Upset stomach  . Keflex [Cephalexin] Other (See Comments)    Abdominal discomfort 04-19-17 Pt has tolerated orally    PAST MEDICAL HISTORY Past Medical History:  Diagnosis Date  . Arthritis   . Atrial fibrillation ([place])   . Balance problem 10/31/2013  . BPH (benign prostatic hyperplasia) 2014   Urology Dr [name] with [facility] [phone]   . Bradycardia    a. baseline HR in the 30's. Asymptomatic - no history of PPM placement.   Marland Kitchen CAD (coronary artery disease)    LHC 03/16/11: Mid LAD 10-20%, proximal circumflex 10%, mid RCA 20%, EF 55%.  . Cataract   . Complete heart block ([place])   . Fracture of femoral neck, right ([place]) 04/19/2017  . GERD (gastroesophageal reflux disease)    takes occasional  zantac  . Hx of echocardiogram    Echo 02/2011: EF 65-70%, normal wall motion, MAC, mild MR, mild LAE, mild RVE  . Hypertension   . OSA (obstructive sleep  apnea)    per patient went to have sleep study done about 2 years ago, bu they never F/U with him about results; but wife reports he still has periods of apnea at night   . Talar fracture    casted no surg   Past Surgical History:  Procedure Laterality Date  . ABDOMINAL AORTOGRAM W/LOWER EXTREMITY N/A 01/17/2017   Procedure: ABDOMINAL AORTOGRAM W/LOWER EXTREMITY;  Surgeon: Angelia Mould, MD;  Location: [place] [facility];  Service: Cardiovascular;  Laterality: N/A;  . COLONOSCOPY     Repeated and normal in 2011  . FRACTURE SURGERY    . HEMIARTHROPLASTY HIP Right 04/19/2017  . HEMORROIDECTOMY    . HIP ARTHROPLASTY Right 04/19/2017   Procedure: ARTHROPLASTY BIPOLAR HIP (HEMIARTHROPLASTY);   Surgeon: Marchia Bond, MD;  Location: [place];  Service: Orthopedics;  Laterality: Right;  . HIP ARTHROPLASTY Left 12/16/2018   Procedure: ARTHROPLASTY BIPOLAR HIP (HEMIARTHROPLASTY);  Surgeon: Altamese Herndon, MD;  Location: [place];  Service: Orthopedics;  Laterality: Left;  . INGUINAL HERNIA REPAIR Right 02/26/2014   Procedure: LAPAROSCOPIC RIGHT INGUINAL HERNIA REPAIR;  Surgeon: Ralene Ok, MD;  Location: [place];  Service: General;  Laterality: Right;  . INGUINAL HERNIA REPAIR    . INSERTION OF MESH Right 02/26/2014   Procedure: INSERTION OF MESH;  Surgeon: Ralene Ok, MD;  Location: [place];  Service: General;  Laterality: Right;  . LEFT HEART CATHETERIZATION WITH CORONARY ANGIOGRAM N/A 03/16/2011   Procedure: LEFT HEART CATHETERIZATION WITH CORONARY ANGIOGRAM;  Surgeon: Peter M Martinique, MD;  Location: [facility] CATH LAB;  Service: Cardiovascular;  Laterality: N/A;  . PROSTATE BIOPSY     negative - [facility]  . TRANSURETHRAL RESECTION OF PROSTATE N/A 09/07/2016   Procedure: TRANSURETHRAL RESECTION OF THE PROSTATE (TURP);  Surgeon: Irine Seal, MD;  Location: [facility];  Service: Urology;  Laterality: N/A;    FAMILY HISTORY Family History  Problem Relation Age of Onset  . Hypertension Mother   . Multiple sclerosis Sister     SOCIAL HISTORY Social History   Tobacco Use  . Smoking status: Former Smoker    Packs/day: 1.00    Years: 3.00    Pack years: 3.00    Types: Cigarettes    Quit date: 03/17/1955    Years since quitting: 64.7  . Smokeless tobacco: Former Network engineer  . Vaping Use: Never used  Substance Use Topics  . Alcohol use: Yes    Alcohol/week: 14.0 standard drinks    Types: 7 Cans of beer, 7 Standard drinks or equivalent per week    Comment: daily  . Drug use: No         OPHTHALMIC EXAM:  Base Eye Exam    Visual Acuity (ETDRS)      Right Left   Dist cc E card @ 5' 20/60 +1   Dist ph cc NI    Correction: Glasses       Tonometry (Tonopen, 2:09 PM)       Right Left   Pressure 14 16       Pupils      Pupils Dark Light Shape React APD   Right PERRL 4 3 Round Sluggish None   Left PERRL 4 3 Round Sluggish None       Visual Fields (Counting fingers)      Left Right   Restrictions Partial outer inferior temporal, superior nasal, inferior nasal deficiencies Partial outer superior nasal, inferior nasal deficiencies       Extraocular  Movement      Right Left    Full Full       Neuro/Psych    Oriented x3: Yes   Mood/Affect: Normal       Dilation    Left eye: 1.0% Mydriacyl, 2.5% Phenylephrine @ 2:14 PM        Slit Lamp and Fundus Exam    External Exam      Right Left   External Normal Normal       Slit Lamp Exam      Right Left   Lids/Lashes Normal Normal   Conjunctiva/Sclera White and quiet White and quiet   Cornea Clear Clear   Anterior Chamber Deep and quiet Deep and quiet   Iris Round and reactive Round and reactive   Lens Posterior chamber intraocular lens Posterior chamber intraocular lens   Anterior Vitreous Normal Normal       Fundus Exam      Right Left   Posterior Vitreous  Posterior vitreous detachment   Disc  Normal   C/D Ratio  0.75   Macula  Geographic atrophy, Soft drusen, Subretinal hemorrhage smaller   Vessels  Normal   Periphery  Normal          IMAGING AND PROCEDURES  Imaging and Procedures for 12/13/19  OCT, Retina - OU - Both Eyes       Right Eye Quality was good. Scan locations included subfoveal. Central Foveal Thickness: 239.   Left Eye Quality was borderline. Scan locations included subfoveal. Central Foveal Thickness: 339. Progression has improved.   Notes Much less CME OD after intravitreal Eylea some 16 days previous.  BRVO is responsive to therapy.  OS, much less intraretinal fluid from exudative CNVM, newly ttreated       Intravitreal Injection, Pharmacologic Agent - OS - Left Eye       Time Out 12/13/2019. 3:17 PM. Confirmed correct patient, procedure, site,  and patient consented.   Anesthesia Topical anesthesia was used. Anesthetic medications included Akten 3.5%.   Procedure Preparation included Tobramycin 0.3%, 10% betadine to eyelids, 5% betadine to ocular surface. A 30 gauge needle was used.   Injection:  2 mg aflibercept Alfonse Flavors) SOLN   NDC: [id number], Lot: [id number]   Route: Intravitreal, Site: Left Eye, Waste: 0 mg  Post-op Post injection exam found visual acuity of at least counting fingers. The patient tolerated the procedure well. There were no complications. The patient received written and verbal post procedure care education. Post injection medications were not given.                 ASSESSMENT/PLAN:  Exudative age-related macular degeneration of left eye with active choroidal neovascularization (HCC) The nature of wet macular degeneration was discussed with the patient.  Forms of therapy reviewed include the use of Anti-VEGF medications injected painlessly into the eye, as well as other possible treatment modalities, including thermal laser therapy. Fellow eye involvement and risks were discussed with the patient. Upon the finding of wet age related macular degeneration, treatment will be offered. The treatment regimen is on a treat as needed basis with the intent to treat if necessary and extend interval of exams when possible. On average 1 out of 6 patients do not need lifetime therapy. However, the risk of recurrent disease is high for a lifetime.  Initially monthly, then periodic, examinations and evaluations will determine whether the next treatment is required on the day of the examination.  OS, much improved post Eylea will repeat today  and examination in 5 to 6 weeks  Central retinal vein occlusion with macular edema of right eye Vastly improved CME 16 days post intravitreal antivegF injection      ICD-10-CM   1. Exudative age-related macular degeneration of left eye with active choroidal neovascularization  (HCC)  H35.3221 OCT, Retina - OU - Both Eyes    Intravitreal Injection, Pharmacologic Agent - OS - Left Eye    aflibercept (EYLEA) SOLN 2 mg  2. Central retinal vein occlusion with macular edema of right eye  H34.8110     1.  Ischemic central retinal vein occlusion with massive CME still improved OD at 16 days post intravitreal antivegF.  2.  OS now at 5-week status post injection for CNVM, improved.  3.  OD, follow-up as scheduled  Ophthalmic Meds Ordered this visit:  Meds ordered this encounter  Medications  . aflibercept (EYLEA) SOLN 2 mg       Return in about 6 weeks (around 01/24/2020) for dilate, OS, EYLEA OCT.  There are no Patient Instructions on file for this visit.   Explained the diagnoses, plan, and follow up with the patient and they expressed understanding.  Patient expressed understanding of the importance of proper follow up care.   Clent Demark Tikisha Molinaro M.D. Diseases & Surgery of the Retina and Vitreous [facility] 12/13/19     Abbreviations: M myopia (nearsighted); A astigmatism; H hyperopia (farsighted); P presbyopia; Mrx spectacle prescription;  CTL contact lenses; OD right eye; OS left eye; OU both eyes  XT exotropia; ET esotropia; PEK punctate epithelial keratitis; PEE punctate epithelial erosions; DES dry eye syndrome; MGD meibomian gland dysfunction; ATs artificial tears; PFAT's preservative free artificial tears; [place] nuclear sclerotic cataract; PSC posterior subcapsular cataract; ERM epi-retinal membrane; PVD posterior vitreous detachment; RD retinal detachment; DM diabetes mellitus; DR diabetic retinopathy; NPDR non-proliferative diabetic retinopathy; PDR proliferative diabetic retinopathy; CSME clinically significant macular edema; DME diabetic macular edema; dbh dot blot hemorrhages; CWS cotton wool spot; POAG primary open angle glaucoma; C/D cup-to-disc ratio; HVF humphrey visual field; GVF goldmann visual field; OCT optical coherence  tomography; IOP intraocular pressure; BRVO Branch retinal vein occlusion; CRVO central retinal vein occlusion; CRAO central retinal artery occlusion; BRAO branch retinal artery occlusion; RT retinal tear; SB scleral buckle; PPV pars plana vitrectomy; VH Vitreous hemorrhage; PRP panretinal laser photocoagulation; IVK intravitreal kenalog; VMT vitreomacular traction; MH Macular hole;  NVD neovascularization of the disc; NVE neovascularization elsewhere; AREDS age related eye disease study; ARMD age related macular degeneration; POAG primary open angle glaucoma; EBMD epithelial/anterior basement membrane dystrophy; ACIOL anterior chamber intraocular lens; IOL intraocular lens; PCIOL posterior chamber intraocular lens; Phaco/IOL phacoemulsification with intraocular lens placement; [place] photorefractive keratectomy; LASIK laser assisted in situ keratomileusis; HTN hypertension; DM diabetes mellitus; COPD chronic obstructive pulmonary disease

## 2020-01-10 ENCOUNTER — Other Ambulatory Visit: Payer: Self-pay

## 2020-01-10 ENCOUNTER — Ambulatory Visit: Payer: Medicare HMO | Admitting: Podiatry

## 2020-01-10 ENCOUNTER — Encounter: Payer: Self-pay | Admitting: Podiatry

## 2020-01-10 DIAGNOSIS — M79675 Pain in left toe(s): Secondary | ICD-10-CM

## 2020-01-10 DIAGNOSIS — B351 Tinea unguium: Secondary | ICD-10-CM | POA: Diagnosis not present

## 2020-01-10 DIAGNOSIS — M79674 Pain in right toe(s): Secondary | ICD-10-CM | POA: Diagnosis not present

## 2020-01-11 NOTE — Progress Notes (Signed)
Subjective:   Patient ID: Chad Russell, male   DOB: 86 y.o.   MRN: [id number]   HPI Patient presents with thick yellow brittle nailbeds 1-5 both feet that become very painful and make it hard for her to wear shoe gear comfortably.  States this is been ongoing   ROS      Objective:  Physical Exam  Neurovascular status intact with thick yellow brittle nailbeds 1-5 both feet     Assessment:  Painful mycotic nail infection 1-5 both feet     Plan:  Debridement of painful nailbeds 1-5 both feet no iatrogenic bleeding and reappoint as needed

## 2020-01-22 ENCOUNTER — Encounter (INDEPENDENT_AMBULATORY_CARE_PROVIDER_SITE_OTHER): Payer: Medicare HMO | Admitting: Ophthalmology

## 2020-01-24 ENCOUNTER — Telehealth: Payer: Self-pay

## 2020-01-24 ENCOUNTER — Other Ambulatory Visit: Payer: Self-pay

## 2020-01-24 ENCOUNTER — Ambulatory Visit (INDEPENDENT_AMBULATORY_CARE_PROVIDER_SITE_OTHER): Payer: Medicare HMO

## 2020-01-24 ENCOUNTER — Telehealth: Payer: Self-pay | Admitting: Family Medicine

## 2020-01-24 ENCOUNTER — Encounter (INDEPENDENT_AMBULATORY_CARE_PROVIDER_SITE_OTHER): Payer: Medicare HMO | Admitting: Ophthalmology

## 2020-01-24 DIAGNOSIS — N390 Urinary tract infection, site not specified: Secondary | ICD-10-CM | POA: Diagnosis not present

## 2020-01-24 LAB — POCT URINALYSIS DIP (MANUAL ENTRY)
Bilirubin, UA: NEGATIVE
Blood, UA: NEGATIVE
Glucose, UA: NEGATIVE mg/dL
Leukocytes, UA: NEGATIVE
Nitrite, UA: NEGATIVE
Spec Grav, UA: >=1.03 — AB (ref 1.010–1.025)
Urobilinogen, UA: 0.2 E.U./dL
pH, UA: 5.5 (ref 5.0–8.0)

## 2020-01-24 NOTE — Telephone Encounter (Signed)
Pt came in today urine specimen to be cultured, believes he has a uti. He is also requesting culture. Called pt to make virtual appt to discuss findings, na. Sent message to scheduling pool to call pt back for appt. No charge capture added as of yet to today's visit due to unsurely.  Will talk to team lead to instruct

## 2020-01-24 NOTE — Telephone Encounter (Signed)
Patient may have UTI. He has difficulty walking. Wife will try to bring him in late this afternoon to give a urine specimen. Please advise at [phone]

## 2020-01-24 NOTE — Telephone Encounter (Signed)
I scheduled a nurse visit for today.

## 2020-01-24 NOTE — Telephone Encounter (Signed)
Patient will also need a virtual or in person appt to follow up the results, thanks

## 2020-01-25 ENCOUNTER — Telehealth: Payer: Self-pay | Admitting: Family Medicine

## 2020-01-25 NOTE — Telephone Encounter (Signed)
Called pt to get more information about his urinary symptoms per Dr.Greens request. I called @ 9:24 and 10:40 no answer left VM's both times will try again.

## 2020-01-25 NOTE — Telephone Encounter (Signed)
I attempt to reach out to the patient so we can get him scheduled. No answer. I left a msg for patient to return call so we can get him scheduled for a office visit or a tele-med visit to over over his UTI symptoms. We did collect urine sample on 01/24/20 and we need a visit to over over symptoms and results.

## 2020-01-25 NOTE — Telephone Encounter (Signed)
Called pt . LVM per clinical  for patient to call us to schedule virtual appt to go over recent Urine results .

## 2020-01-25 NOTE — Telephone Encounter (Signed)
Per Dr. Carlota Raspberry "Urinalysis yesterday without nitrite or leukocyte esterase, but I would recommend he get evaluated by a provider for any symptoms, including if any difficulty walking as was initially presented on telephone message. Would recommend urgent care or ER evaluation over the weekend if that is the case. Please notate this to telephone message. Let me know if there are questions from patient or spouse"

## 2020-01-28 ENCOUNTER — Other Ambulatory Visit: Payer: Self-pay

## 2020-01-28 ENCOUNTER — Encounter: Payer: Self-pay | Admitting: Family Medicine

## 2020-01-28 ENCOUNTER — Telehealth (INDEPENDENT_AMBULATORY_CARE_PROVIDER_SITE_OTHER): Payer: Medicare HMO | Admitting: Family Medicine

## 2020-01-28 VITALS — Ht 73.0 in | Wt 170.0 lb

## 2020-01-28 DIAGNOSIS — R5383 Other fatigue: Secondary | ICD-10-CM | POA: Diagnosis not present

## 2020-01-28 DIAGNOSIS — R5381 Other malaise: Secondary | ICD-10-CM | POA: Diagnosis not present

## 2020-01-28 NOTE — Patient Instructions (Signed)
° ° ° °  If you have lab work done today you will be contacted with your lab results within the next 2 weeks.  If you have not heard from us then please contact us. The fastest way to get your results is to register for My Chart. ° ° °IF you received an x-ray today, you will receive an invoice from [facility]. Please contact [facility] at [phone] with questions or concerns regarding your invoice.  ° °IF you received labwork today, you will receive an invoice from [facility]. Please contact [facility] at [phone] with questions or concerns regarding your invoice.  ° °Our billing staff will not be able to assist you with questions regarding bills from these companies. ° °You will be contacted with the lab results as soon as they are available. The fastest way to get your results is to activate your My Chart account. Instructions are located on the last page of this paperwork. If you have not heard from us regarding the results in 2 weeks, please contact this office. °  ° ° ° °

## 2020-01-28 NOTE — Progress Notes (Signed)
Virtual Visit via Telephone Note  I connected with Chad Russell on 01/28/20 at 5:01 PM by telephone and verified that I am speaking with the correct person using two identifiers. Patient location: home  My location: work    I discussed the limitations, risks, security and privacy concerns of performing an evaluation and management service by telephone and the availability of in person appointments. I also discussed with the patient that there may be a patient responsible charge related to this service. The patient expressed understanding and agreed to proceed, consent obtained  Chief complaint: Chief Complaint  Patient presents with  . Possible UTI    Pt has no symptoms, but felt he had a UTI. pt would like to go over the results of his urine screening.    History of Present Illness: Chad Russell is a 86 y.o. male Presents today for: Chief Complaint  Patient presents with  . Possible UTI    Pt has no symptoms, but felt he had a UTI. pt would like to go over the results of his urine screening.   Last week felt a little more tired about 1 week ago. Concerned he may be getting a UTI. No fever, normal temp around 97, no new abdominal pain/back pain. No dysuria, hematuria, urgency, frequency or difficulty initiating urination.  No chest pain /dyspnea/cough or other new symptoms.   Since last week - feels much better than last week and back to baseline. Eating and drinking ok. May have had less water last week.  Not very thirsty, did increase fluids.   Results for orders placed or performed in visit on 01/24/20  POCT urinalysis dipstick  Result Value Ref Range   Color, UA orange (A) yellow   Clarity, UA cloudy (A) clear   Glucose, UA negative negative mg/dL   Bilirubin, UA negative negative   Ketones, POC UA trace (5) (A) negative mg/dL   Spec Grav, UA >=1.030 (A) 1.010 - 1.025   Blood, UA negative negative   pH, UA 5.5 5.0 - 8.0   Protein Ur, POC trace (A) negative  mg/dL   Urobilinogen, UA 0.2 0.2 or 1.0 E.U./dL   Nitrite, UA Negative Negative   Leukocytes, UA Negative Negative     Patient Active Problem List   Diagnosis Date Noted  . Educated about COVID-19 virus infection 11/29/2019  . Exudative age-related macular degeneration of left eye with active choroidal neovascularization ([place]) 10/02/2019  . Central retinal vein occlusion with macular edema of right eye 06/20/2019  . Central retinal vein occlusion with neovascularization of right eye 06/20/2019  . Nondiabetic proliferative retinopathy, left 06/20/2019  . Ocular ischemic syndrome 06/20/2019  . Nondiabetic proliferative retinopathy, right 06/20/2019  . Retinal hemorrhage of left eye 06/20/2019  . Retinal hemorrhage of right eye 06/20/2019  . AKI (acute kidney injury) ([place]) 12/15/2018  . Hematoma of scalp   . Heart block   . Chest pain 05/04/2018  . Fracture of femoral neck, left ([place]) 04/19/2017  . MGUS (monoclonal gammopathy of unknown significance) 01/15/2017  . BPH with obstruction/lower urinary tract symptoms 09/07/2016  . Preoperative cardiovascular examination 08/24/2016  . Urinary retention 05/13/2016  . Balance problem 10/31/2013  . OSA (obstructive sleep apnea) 10/31/2013  . BPH (benign prostatic hyperplasia) 08/28/2012  . Atrial fibrillation ([place]) 06/08/2012  . Hyperplasia of prostate with lower urinary tract symptoms (LUTS) 04/28/2012  . Moderate malnutrition ([place]) 04/28/2012  . Infection of urinary tract - recurrent 04/26/2012  . NSTEMI (non-ST elevated myocardial  infarction) ([place]) 03/15/2011  . Complete heart block ([place]) 03/15/2011  . HTN (hypertension) 03/15/2011   Past Medical History:  Diagnosis Date  . Arthritis   . Atrial fibrillation ([place])   . Balance problem 10/31/2013  . BPH (benign prostatic hyperplasia) 2014   Urology Dr [name] with [facility] [phone]   . Bradycardia    a. baseline HR in the 30's. Asymptomatic - no history of PPM placement.   Marland Kitchen CAD  (coronary artery disease)    LHC 03/16/11: Mid LAD 10-20%, proximal circumflex 10%, mid RCA 20%, EF 55%.  . Cataract   . Complete heart block ([place])   . Fracture of femoral neck, right ([place]) 04/19/2017  . GERD (gastroesophageal reflux disease)    takes occasional  zantac  . Hx of echocardiogram    Echo 02/2011: EF 65-70%, normal wall motion, MAC, mild MR, mild LAE, mild RVE  . Hypertension   . OSA (obstructive sleep apnea)    per patient went to have sleep study done about 2 years ago, bu they never F/U with him about results; but wife reports he still has periods of apnea at night   . Talar fracture    casted no surg   Past Surgical History:  Procedure Laterality Date  . ABDOMINAL AORTOGRAM W/LOWER EXTREMITY N/A 01/17/2017   Procedure: ABDOMINAL AORTOGRAM W/LOWER EXTREMITY;  Surgeon: Angelia Mould, MD;  Location: [facility];  Service: Cardiovascular;  Laterality: N/A;  . COLONOSCOPY     Repeated and normal in 2011  . FRACTURE SURGERY    . HEMIARTHROPLASTY HIP Right 04/19/2017  . HEMORROIDECTOMY    . HIP ARTHROPLASTY Right 04/19/2017   Procedure: ARTHROPLASTY BIPOLAR HIP (HEMIARTHROPLASTY);  Surgeon: Marchia Bond, MD;  Location: [facility];  Service: Orthopedics;  Laterality: Right;  . HIP ARTHROPLASTY Left 12/16/2018   Procedure: ARTHROPLASTY BIPOLAR HIP (HEMIARTHROPLASTY);  Surgeon: Altamese Franklin, MD;  Location: [facility];  Service: Orthopedics;  Laterality: Left;  . INGUINAL HERNIA REPAIR Right 02/26/2014   Procedure: LAPAROSCOPIC RIGHT INGUINAL HERNIA REPAIR;  Surgeon: Ralene Ok, MD;  Location: [facility];  Service: General;  Laterality: Right;  . INGUINAL HERNIA REPAIR    . INSERTION OF MESH Right 02/26/2014   Procedure: INSERTION OF MESH;  Surgeon: Ralene Ok, MD;  Location: [facility];  Service: General;  Laterality: Right;  . LEFT HEART CATHETERIZATION WITH CORONARY ANGIOGRAM N/A 03/16/2011   Procedure: LEFT HEART CATHETERIZATION WITH CORONARY ANGIOGRAM;  Surgeon: Peter M  Martinique, MD;  Location: [facility];  Service: Cardiovascular;  Laterality: N/A;  . PROSTATE BIOPSY     negative - [facility]  . TRANSURETHRAL RESECTION OF PROSTATE N/A 09/07/2016   Procedure: TRANSURETHRAL RESECTION OF THE PROSTATE (TURP);  Surgeon: Irine Seal, MD;  Location: [facility];  Service: Urology;  Laterality: N/A;   Allergies  Allergen Reactions  . Doxycycline Other (See Comments)    Upset stomach  . Keflex [Cephalexin] Other (See Comments)    Abdominal discomfort 04-19-17 Pt has tolerated orally   Prior to Admission medications   Medication Sig Start Date End Date Taking? Authorizing Provider  acetaminophen (TYLENOL) 500 MG tablet Take 1 tablet (500 mg total) by mouth every 8 (eight) hours. 12/26/18  Yes Debbe Odea, MD  bisacodyl (DULCOLAX) 5 MG EC tablet Take 1 tablet (5 mg total) by mouth daily as needed for moderate constipation. 12/26/18  Yes Debbe Odea, MD  carbidopa-levodopa (SINEMET) 25-100 MG tablet Take 1.5 tablets by mouth in the morning and at bedtime.  06/11/19  Yes Patel, Donika K, DO  lisinopril-hydrochlorothiazide (ZESTORETIC) 10-12.5 MG tablet TAKE ONE TABLET BY MOUTH DAILY 11/16/19  Yes Minus Breeding, MD  Misc Natural Products (PROSTATE) CAPS Take 1 tablet daily by mouth. Prostate Plus otc supplement   Yes [provider]  Multiple Vitamin (MULTIVITAMIN WITH MINERALS) TABS tablet Take 1 tablet daily by mouth.   Yes [provider]  Multiple Vitamins-Minerals (PRESERVISION AREDS 2+MULTI VIT PO) Take 1 capsule by mouth 2 (two) times daily.   Yes [provider]  rivaroxaban (XARELTO) 20 MG TABS tablet TAKE ONE TABLET BY MOUTH DAILY WITH SUPPER 11/05/19  Yes Hochrein, Jeneen Rinks, MD  senna (SENOKOT) 8.6 MG TABS tablet Take 1 tablet by mouth daily as needed for mild constipation.   Yes [provider]  timolol (TIMOPTIC) 0.5 % ophthalmic solution PLACE ONE DROP IN [facility] EYE ONCE DAILY 09/11/19  Yes Rankin, Clent Demark, MD  vitamin C  (ASCORBIC ACID) 500 MG tablet Take 500 mg by mouth daily.   Yes [provider]   Social History   Socioeconomic History  . Marital status: Married    Spouse name: Mardene Celeste  . Number of children: 0  . Years of education: 22  . Highest education level: Not on file  Occupational History  . Occupation: Scientist, product/process development  . Occupation: Civil engineer, contracting  Tobacco Use  . Smoking status: Former Smoker    Packs/day: 1.00    Years: 3.00    Pack years: 3.00    Types: Cigarettes    Quit date: 03/17/1955    Years since quitting: 64.9  . Smokeless tobacco: Former Network engineer  . Vaping Use: Never used  Substance and Sexual Activity  . Alcohol use: Yes    Alcohol/week: 14.0 standard drinks    Types: 7 Cans of beer, 7 Standard drinks or equivalent per week    Comment: daily  . Drug use: No  . Sexual activity: Not Currently    Birth control/protection: None  Other Topics Concern  . 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  Has no children.     Right handed    Social Determinants of Health   Financial Resource Strain:   . Difficulty of Paying Living Expenses: Not on file  Food Insecurity:   . Worried About Charity fundraiser in the Last Year: Not on file  . Ran Out of Food in the Last Year: Not on file  Transportation Needs:   . Lack of Transportation (Medical): Not on file  . Lack of Transportation (Non-Medical): Not on file  Physical Activity:   . Days of Exercise per Week: Not on file  . Minutes of Exercise per Session: Not on file  Stress:   . Feeling of Stress : Not on file  Social Connections:   . Frequency of Communication with Friends and Family: Not on file  . Frequency of Social Gatherings with Friends and Family: Not on file  . Attends Religious Services: Not on file  .  Active Member of Clubs or Organizations: Not on file  . Attends Archivist Meetings: Not on file  . Marital Status: Not on file  Intimate Partner Violence:   . Fear of Current or Ex-Partner: Not on file  .  Emotionally Abused: Not on file  . Physically Abused: Not on file  . Sexually Abused: Not on file     Observations/Objective: Vitals:   01/28/20 1102  Weight: 170 lb (77.1 kg)  Height: _0  (1.854 m)  no distress, speaking without distress.  All questions answered.   Assessment and Plan: Malaise and fatigue  - no apparent sign of infection on prior Urinalysis. Symptoms resolved now. Potentially was volume depleted and with his increased fluid intake for concern of UTI is now back to baseline. rtc precautions given if recurrence of symptoms. Maintenance of hydration discussed.   Follow Up Instructions: If recurence of symptoms.    I discussed the assessment and treatment plan with the patient. The patient was provided an opportunity to ask questions and all were answered. The patient agreed with the plan and demonstrated an understanding of the instructions.   The patient was advised to call back or seek an in-person evaluation if the symptoms worsen or if the condition fails to improve as anticipated.  I provided 17  minutes of non-face-to-face time during this encounter.  Signed,   [name], MD [facility].  01/28/20

## 2020-02-04 ENCOUNTER — Other Ambulatory Visit: Payer: Self-pay

## 2020-02-04 ENCOUNTER — Encounter (INDEPENDENT_AMBULATORY_CARE_PROVIDER_SITE_OTHER): Payer: Self-pay | Admitting: Ophthalmology

## 2020-02-04 ENCOUNTER — Ambulatory Visit (INDEPENDENT_AMBULATORY_CARE_PROVIDER_SITE_OTHER): Payer: Medicare HMO | Admitting: Ophthalmology

## 2020-02-04 DIAGNOSIS — H34811 Central retinal vein occlusion, right eye, with macular edema: Secondary | ICD-10-CM

## 2020-02-04 DIAGNOSIS — H353221 Exudative age-related macular degeneration, left eye, with active choroidal neovascularization: Secondary | ICD-10-CM

## 2020-02-04 MED ORDER — AFLIBERCEPT 2MG/0.05ML IZ SOLN FOR KALEIDOSCOPE
2.0000 mg | INTRAVITREAL | Status: AC | PRN
  Administered 2020-02-04: 2 mg via INTRAVITREAL

## 2020-02-04 NOTE — Assessment & Plan Note (Signed)
OD CME was resolved upon last visit some 9 weeks or so prior, now massive recurrence, needs treatment soon

## 2020-02-04 NOTE — Progress Notes (Signed)
02/04/2020     CHIEF COMPLAINT Patient presents for Retina Follow Up (7 WK F/U OS, POSS EYLEA OS ///Pt reports vision improved OS, no F/F, no pain or pressure. )   HISTORY OF PRESENT ILLNESS: Chad Russell is a 86 y.o. male who presents to the clinic today for:   HPI    Retina Follow Up    Patient presents with  Dry AMD.  In left eye.  This started 7 weeks ago.  Duration of 7 weeks.  Since onset it is stable. Additional comments: 7 WK F/U OS, POSS EYLEA OS    Pt reports vision improved OS, no F/F, no pain or pressure.        Last edited by Nichola Sizer D on 02/04/2020  2:45 PM. (History)      Referring physician: [name], MD [address] [place],  [place] [address]  HISTORICAL INFORMATION:   Selected notes from the MEDICAL RECORD NUMBER       CURRENT MEDICATIONS: Current Outpatient Medications (Ophthalmic Drugs)  Medication Sig  . timolol (TIMOPTIC) 0.5 % ophthalmic solution PLACE ONE DROP IN [facility] EYE ONCE DAILY   No current facility-administered medications for this visit. (Ophthalmic Drugs)   Current Outpatient Medications (Other)  Medication Sig  . acetaminophen (TYLENOL) 500 MG tablet Take 1 tablet (500 mg total) by mouth every 8 (eight) hours.  . bisacodyl (DULCOLAX) 5 MG EC tablet Take 1 tablet (5 mg total) by mouth daily as needed for moderate constipation.  . carbidopa-levodopa (SINEMET) 25-100 MG tablet Take 1.5 tablets by mouth in the morning and at bedtime.  Marland Kitchen lisinopril-hydrochlorothiazide (ZESTORETIC) 10-12.5 MG tablet TAKE ONE TABLET BY MOUTH DAILY  . Misc Natural Products (PROSTATE) CAPS Take 1 tablet daily by mouth. Prostate Plus otc supplement  . Multiple Vitamin (MULTIVITAMIN WITH MINERALS) TABS tablet Take 1 tablet daily by mouth.  . Multiple Vitamins-Minerals (PRESERVISION AREDS 2+MULTI VIT PO) Take 1 capsule by mouth 2 (two) times daily.  . rivaroxaban (XARELTO) 20 MG TABS tablet TAKE ONE TABLET BY MOUTH DAILY WITH SUPPER  .  senna (SENOKOT) 8.6 MG TABS tablet Take 1 tablet by mouth daily as needed for mild constipation.  . vitamin C (ASCORBIC ACID) 500 MG tablet Take 500 mg by mouth daily.   No current facility-administered medications for this visit. (Other)      REVIEW OF SYSTEMS:    ALLERGIES Allergies  Allergen Reactions  . Doxycycline Other (See Comments)    Upset stomach  . Keflex [Cephalexin] Other (See Comments)    Abdominal discomfort 04-19-17 Pt has tolerated orally    PAST MEDICAL HISTORY Past Medical History:  Diagnosis Date  . Arthritis   . Atrial fibrillation ([place])   . Balance problem 10/31/2013  . BPH (benign prostatic hyperplasia) 2014   Urology Dr [name] with [facility] [phone]   . Bradycardia    a. baseline HR in the 30's. Asymptomatic - no history of PPM placement.   Marland Kitchen CAD (coronary artery disease)    LHC 03/16/11: Mid LAD 10-20%, proximal circumflex 10%, mid RCA 20%, EF 55%.  . Cataract   . Complete heart block ([place])   . Fracture of femoral neck, right ([place]) 04/19/2017  . GERD (gastroesophageal reflux disease)    takes occasional  zantac  . Hx of echocardiogram    Echo 02/2011: EF 65-70%, normal wall motion, MAC, mild MR, mild LAE, mild RVE  . Hypertension   . OSA (obstructive sleep apnea)    per  patient went to have sleep study done about 2 years ago, bu they never F/U with him about results; but wife reports he still has periods of apnea at night   . Talar fracture    casted no surg   Past Surgical History:  Procedure Laterality Date  . ABDOMINAL AORTOGRAM W/LOWER EXTREMITY N/A 01/17/2017   Procedure: ABDOMINAL AORTOGRAM W/LOWER EXTREMITY;  Surgeon: Angelia Mould, MD;  Location: [facility];  Service: Cardiovascular;  Laterality: N/A;  . COLONOSCOPY     Repeated and normal in 2011  . FRACTURE SURGERY    . HEMIARTHROPLASTY HIP Right 04/19/2017  . HEMORROIDECTOMY    . HIP ARTHROPLASTY Right 04/19/2017   Procedure: ARTHROPLASTY BIPOLAR HIP  (HEMIARTHROPLASTY);  Surgeon: Marchia Bond, MD;  Location: [facility];  Service: Orthopedics;  Laterality: Right;  . HIP ARTHROPLASTY Left 12/16/2018   Procedure: ARTHROPLASTY BIPOLAR HIP (HEMIARTHROPLASTY);  Surgeon: Altamese [place], MD;  Location: [facility];  Service: Orthopedics;  Laterality: Left;  . INGUINAL HERNIA REPAIR Right 02/26/2014   Procedure: LAPAROSCOPIC RIGHT INGUINAL HERNIA REPAIR;  Surgeon: Ralene Ok, MD;  Location: [facility];  Service: General;  Laterality: Right;  . INGUINAL HERNIA REPAIR    . INSERTION OF MESH Right 02/26/2014   Procedure: INSERTION OF MESH;  Surgeon: Ralene Ok, MD;  Location: [facility];  Service: General;  Laterality: Right;  . LEFT HEART CATHETERIZATION WITH CORONARY ANGIOGRAM N/A 03/16/2011   Procedure: LEFT HEART CATHETERIZATION WITH CORONARY ANGIOGRAM;  Surgeon: Peter M Martinique, MD;  [facility] CATH LAB;  Service: Cardiovascular;  Laterality: N/A;  . PROSTATE BIOPSY     negative - [facility]  . TRANSURETHRAL RESECTION OF PROSTATE N/A 09/07/2016   Procedure: TRANSURETHRAL RESECTION OF THE PROSTATE (TURP);  Surgeon: Irine Seal, MD;  [facility];  Service: Urology;  Laterality: N/A;    FAMILY HISTORY Family History  Problem Relation Age of Onset  . Hypertension Mother   . Multiple sclerosis Sister     SOCIAL HISTORY Social History   Tobacco Use  . Smoking status: Former Smoker    Packs/day: 1.00    Years: 3.00    Pack years: 3.00    Types: Cigarettes    Quit date: 03/17/1955    Years since quitting: 64.9  . Smokeless tobacco: Former Network engineer  . Vaping Use: Never used  Substance Use Topics  . Alcohol use: Yes    Alcohol/week: 14.0 standard drinks    Types: 7 Cans of beer, 7 Standard drinks or equivalent per week    Comment: daily  . Drug use: No         OPHTHALMIC EXAM:  Base Eye Exam    Visual Acuity (ETDRS)      Right Left   Dist [place] E Card @ 5' 20/70   Dist ph cc NI NI       Tonometry (Tonopen, 2:53 PM)       Right Left   Pressure 15 17       Pupils      Pupils Dark Light Shape React APD   Right PERRL 4 3 Round Sluggish None   Left PERRL 4 3 Round Sluggish None       Visual Fields (Counting fingers)      Left Right   Restrictions Partial outer inferior temporal, superior nasal, inferior nasal deficiencies Partial outer superior nasal, inferior nasal deficiencies       Extraocular Movement      Right Left  Full Full       Neuro/Psych    Oriented x3: Yes   Mood/Affect: Normal       Dilation    Left eye: 1.0% Mydriacyl, 2.5% Phenylephrine @ 2:53 PM        Slit Lamp and Fundus Exam    External Exam      Right Left   External Normal Normal       Slit Lamp Exam      Right Left   Lids/Lashes Normal Normal   Conjunctiva/Sclera White and quiet White and quiet   Cornea Clear Clear   Anterior Chamber Deep and quiet Deep and quiet   Iris Round and reactive Round and reactive   Lens Posterior chamber intraocular lens Posterior chamber intraocular lens   Anterior Vitreous Normal Normal       Fundus Exam      Right Left   Posterior Vitreous  Posterior vitreous detachment   Disc  Pallor 2+   C/D Ratio  0.75   Macula  Geographic atrophy, Soft drusen, Subretinal hemorrhage smaller   Vessels  Normal   Periphery  Normal          IMAGING AND PROCEDURES  Imaging and Procedures for 02/04/20  OCT, Retina - OU - Both Eyes       Right Eye Quality was good. Scan locations included subfoveal. Central Foveal Thickness: 978. Progression has worsened. Findings include abnormal foveal contour.   Left Eye Quality was good. Scan locations included subfoveal. Central Foveal Thickness: 295. Progression has improved. Findings include abnormal foveal contour.   Notes Massive CME from CRV O increased and worsened currently at a 9-week interval, plan was for 8-week follow-up.  OS, improved macular condition, Less active CNVM status post intravitreal Eylea some 7.5 weeks previous,  will repeat injection today and again in 7 weeks       Intravitreal Injection, Pharmacologic Agent - OS - Left Eye       Time Out 02/04/2020. 3:41 PM. Confirmed correct patient, procedure, site, and patient consented.   Anesthesia Topical anesthesia was used. Anesthetic medications included Akten 3.5%.   Procedure Preparation included Tobramycin 0.3%, 10% betadine to eyelids, 5% betadine to ocular surface. A 30 gauge needle was used.   Injection:  2 mg aflibercept Alfonse Flavors) SOLN   NDC: [id number], Lot: [id number]   Route: Intravitreal, Site: Left Eye, Waste: 0 mg  Post-op Post injection exam found visual acuity of at least counting fingers. The patient tolerated the procedure well. There were no complications. The patient received written and verbal post procedure care education. Post injection medications were not given.                 ASSESSMENT/PLAN:  Central retinal vein occlusion with macular edema of right eye OD CME was resolved upon last visit some 9 weeks or so prior, now massive recurrence, needs treatment soon      ICD-10-CM   1. Exudative age-related macular degeneration of left eye with active choroidal neovascularization (HCC)  H35.3221 OCT, Retina - OU - Both Eyes    Intravitreal Injection, Pharmacologic Agent - OS - Left Eye    aflibercept (EYLEA) SOLN 2 mg  2. Central retinal vein occlusion with macular edema of right eye  H34.8110     1.  OS, acuity improved and stable per patient anatomy improved also at 7.5-week follow-up Eylea for CNVM.  We will repeat today and repeat examination OS in 7 weeks  2.  OD, recurrent CME  from massive CRV O at 9-week follow-up which was scheduled for 8 weeks.  Will need intravitreal Eylea OD soon  3.  Ophthalmic Meds Ordered this visit:  Meds ordered this encounter  Medications  . aflibercept (EYLEA) SOLN 2 mg       Return in about 1 week (around 02/11/2020) for dilate, OD, EYLEA OCT.  Patient  Instructions  Patient instructed to contact the office for new onset visual acuity declines or distort    Explained the diagnoses, plan, and follow up with the patient and they expressed understanding.  Patient expressed understanding of the importance of proper follow up care.   Clent Demark Savier Trickett M.D. Diseases & Surgery of the Retina and Vitreous [facility] [place] 02/04/20     Abbreviations: M myopia (nearsighted); A astigmatism; H hyperopia (farsighted); P presbyopia; Mrx spectacle prescription;  CTL contact lenses; OD right eye; OS left eye; OU both eyes  XT exotropia; ET esotropia; PEK punctate epithelial keratitis; PEE punctate epithelial erosions; DES dry eye syndrome; MGD meibomian gland dysfunction; ATs artificial tears; PFAT's preservative free artificial tears; [place] nuclear sclerotic cataract; PSC posterior subcapsular cataract; ERM epi-retinal membrane; PVD posterior vitreous detachment; RD retinal detachment; DM diabetes mellitus; DR diabetic retinopathy; NPDR non-proliferative diabetic retinopathy; PDR proliferative diabetic retinopathy; CSME clinically significant macular edema; DME diabetic macular edema; dbh dot blot hemorrhages; CWS cotton wool spot; POAG primary open angle glaucoma; C/D cup-to-disc ratio; HVF humphrey visual field; GVF goldmann visual field; OCT optical coherence tomography; IOP intraocular pressure; BRVO Branch retinal vein occlusion; CRVO central retinal vein occlusion; CRAO central retinal artery occlusion; BRAO branch retinal artery occlusion; RT retinal tear; SB scleral buckle; PPV pars plana vitrectomy; VH Vitreous hemorrhage; PRP panretinal laser photocoagulation; IVK intravitreal kenalog; VMT vitreomacular traction; MH Macular hole;  NVD neovascularization of the disc; NVE neovascularization elsewhere; AREDS age related eye disease study; ARMD age related macular degeneration; POAG primary open angle glaucoma; EBMD epithelial/anterior basement  membrane dystrophy; ACIOL anterior chamber intraocular lens; IOL intraocular lens; PCIOL posterior chamber intraocular lens; Phaco/IOL phacoemulsification with intraocular lens placement; [place] photorefractive keratectomy; LASIK laser assisted in situ keratomileusis; HTN hypertension; DM diabetes mellitus; COPD chronic obstructive pulmonary disease

## 2020-02-04 NOTE — Patient Instructions (Signed)
Patient instructed to contact the office for new onset visual acuity declines or distort

## 2020-02-07 ENCOUNTER — Ambulatory Visit (INDEPENDENT_AMBULATORY_CARE_PROVIDER_SITE_OTHER): Payer: Medicare HMO | Admitting: Ophthalmology

## 2020-02-07 ENCOUNTER — Encounter (INDEPENDENT_AMBULATORY_CARE_PROVIDER_SITE_OTHER): Payer: Self-pay | Admitting: Ophthalmology

## 2020-02-07 ENCOUNTER — Other Ambulatory Visit: Payer: Self-pay

## 2020-02-07 DIAGNOSIS — H34811 Central retinal vein occlusion, right eye, with macular edema: Secondary | ICD-10-CM | POA: Diagnosis not present

## 2020-02-07 MED ORDER — AFLIBERCEPT 2MG/0.05ML IZ SOLN FOR KALEIDOSCOPE
2.0000 mg | INTRAVITREAL | Status: AC | PRN
  Administered 2020-02-07: 2 mg via INTRAVITREAL

## 2020-02-07 NOTE — Progress Notes (Signed)
02/07/2020     CHIEF COMPLAINT Patient presents for Retina Follow Up   HISTORY OF PRESENT ILLNESS: Chad Russell is a 86 y.o. male who presents to the clinic today for:   HPI    Retina Follow Up    Patient presents with  CRVO/BRVO.  In right eye.  Severity is moderate.  Duration of 10 weeks.  Since onset it is stable.  I, the attending physician,  performed the HPI with the patient and updated documentation appropriately.          Comments    10 Week CRVO f\u OD. Possible Eylea OD. OCT  Pt feels OS is a little better. Denies new complaints.       Last edited by Tilda Franco on 02/07/2020  2:31 PM. (History)      Referring physician: [name], MD [address] [place],  [place] [address]  HISTORICAL INFORMATION:   Selected notes from the MEDICAL RECORD NUMBER       CURRENT MEDICATIONS: Current Outpatient Medications (Ophthalmic Drugs)  Medication Sig  . timolol (TIMOPTIC) 0.5 % ophthalmic solution PLACE ONE DROP IN [facility] EYE ONCE DAILY   No current facility-administered medications for this visit. (Ophthalmic Drugs)   Current Outpatient Medications (Other)  Medication Sig  . acetaminophen (TYLENOL) 500 MG tablet Take 1 tablet (500 mg total) by mouth every 8 (eight) hours.  . bisacodyl (DULCOLAX) 5 MG EC tablet Take 1 tablet (5 mg total) by mouth daily as needed for moderate constipation.  . carbidopa-levodopa (SINEMET) 25-100 MG tablet Take 1.5 tablets by mouth in the morning and at bedtime.  Marland Kitchen lisinopril-hydrochlorothiazide (ZESTORETIC) 10-12.5 MG tablet TAKE ONE TABLET BY MOUTH DAILY  . Misc Natural Products (PROSTATE) CAPS Take 1 tablet daily by mouth. Prostate Plus otc supplement  . Multiple Vitamin (MULTIVITAMIN WITH MINERALS) TABS tablet Take 1 tablet daily by mouth.  . Multiple Vitamins-Minerals (PRESERVISION AREDS 2+MULTI VIT PO) Take 1 capsule by mouth 2 (two) times daily.  . rivaroxaban (XARELTO) 20 MG TABS tablet TAKE ONE TABLET BY MOUTH  DAILY WITH SUPPER  . senna (SENOKOT) 8.6 MG TABS tablet Take 1 tablet by mouth daily as needed for mild constipation.  . vitamin C (ASCORBIC ACID) 500 MG tablet Take 500 mg by mouth daily.   No current facility-administered medications for this visit. (Other)      REVIEW OF SYSTEMS:    ALLERGIES Allergies  Allergen Reactions  . Doxycycline Other (See Comments)    Upset stomach  . Keflex [Cephalexin] Other (See Comments)    Abdominal discomfort 04-19-17 Pt has tolerated orally    PAST MEDICAL HISTORY Past Medical History:  Diagnosis Date  . Arthritis   . Atrial fibrillation ([place])   . Balance problem 10/31/2013  . BPH (benign prostatic hyperplasia) 2014   Urology Dr [name] with [facility] [phone]   . Bradycardia    a. baseline HR in the 30's. Asymptomatic - no history of PPM placement.   Marland Kitchen CAD (coronary artery disease)    LHC 03/16/11: Mid LAD 10-20%, proximal circumflex 10%, mid RCA 20%, EF 55%.  . Cataract   . Complete heart block ([place])   . Fracture of femoral neck, right ([place]) 04/19/2017  . GERD (gastroesophageal reflux disease)    takes occasional  zantac  . Hx of echocardiogram    Echo 02/2011: EF 65-70%, normal wall motion, MAC, mild MR, mild LAE, mild RVE  . Hypertension   . OSA (obstructive sleep apnea)  per patient went to have sleep study done about 2 years ago, bu they never F/U with him about results; but wife reports he still has periods of apnea at night   . Talar fracture    casted no surg   Past Surgical History:  Procedure Laterality Date  . ABDOMINAL AORTOGRAM W/LOWER EXTREMITY N/A 01/17/2017   Procedure: ABDOMINAL AORTOGRAM W/LOWER EXTREMITY;  Surgeon: Angelia Mould, MD;  Location: [facility];  Service: Cardiovascular;  Laterality: N/A;  . COLONOSCOPY     Repeated and normal in 2011  . FRACTURE SURGERY    . HEMIARTHROPLASTY HIP Right 04/19/2017  . HEMORROIDECTOMY    . HIP ARTHROPLASTY Right 04/19/2017   Procedure: ARTHROPLASTY  BIPOLAR HIP (HEMIARTHROPLASTY);  Surgeon: Marchia Bond, MD;  Location: [facility];  Service: Orthopedics;  Laterality: Right;  . HIP ARTHROPLASTY Left 12/16/2018   Procedure: ARTHROPLASTY BIPOLAR HIP (HEMIARTHROPLASTY);  Surgeon: Altamese Naselle, MD;  Location: [facility] [facility];  Service: Orthopedics;  Laterality: Left;  . INGUINAL HERNIA REPAIR Right 02/26/2014   Procedure: LAPAROSCOPIC RIGHT INGUINAL HERNIA REPAIR;  Surgeon: Ralene Ok, MD;  Location: [facility];  Service: General;  Laterality: Right;  . INGUINAL HERNIA REPAIR    . INSERTION OF MESH Right 02/26/2014   Procedure: INSERTION OF MESH;  Surgeon: Ralene Ok, MD;  Location: [place];  Service: General;  Laterality: Right;  . LEFT HEART CATHETERIZATION WITH CORONARY ANGIOGRAM N/A 03/16/2011   Procedure: LEFT HEART CATHETERIZATION WITH CORONARY ANGIOGRAM;  Surgeon: Peter M Martinique, MD;  Location: [facility];  Service: Cardiovascular;  Laterality: N/A;  . PROSTATE BIOPSY     negative - [facility]  . TRANSURETHRAL RESECTION OF PROSTATE N/A 09/07/2016   Procedure: TRANSURETHRAL RESECTION OF THE PROSTATE (TURP);  Surgeon: Irine Seal, MD;  Location: [facility];  Service: Urology;  Laterality: N/A;    FAMILY HISTORY Family History  Problem Relation Age of Onset  . Hypertension Mother   . Multiple sclerosis Sister     SOCIAL HISTORY Social History   Tobacco Use  . Smoking status: Former Smoker    Packs/day: 1.00    Years: 3.00    Pack years: 3.00    Types: Cigarettes    Quit date: 03/17/1955    Years since quitting: 64.9  . Smokeless tobacco: Former Network engineer  . Vaping Use: Never used  Substance Use Topics  . Alcohol use: Yes    Alcohol/week: 14.0 standard drinks    Types: 7 Cans of beer, 7 Standard drinks or equivalent per week    Comment: daily  . Drug use: No         OPHTHALMIC EXAM:  Base Eye Exam    Visual Acuity (Snellen - Linear)      Right Left   Dist cc CF @ 3' 20/100 +   Dist ph cc NI NI   Correction: Glasses        Tonometry (Tonopen, 2:37 PM)      Right Left   Pressure 13 15       Pupils      Pupils Dark Light Shape React APD   Right PERRL 3 3 Round Minimal None   Left PERRL 3 3 Round Minimal None       Neuro/Psych    Oriented x3: Yes   Mood/Affect: Normal       Dilation    Right eye: 1.0% Mydriacyl, 2.5% Phenylephrine @ 2:37 PM        Slit Lamp and  Fundus Exam    External Exam      Right Left   External Normal Normal       Slit Lamp Exam      Right Left   Lids/Lashes Normal Normal   Conjunctiva/Sclera White and quiet White and quiet   Cornea Clear Clear   Anterior Chamber Deep and quiet Deep and quiet   Iris Round and reactive Round and reactive   Lens Posterior chamber intraocular lens Posterior chamber intraocular lens   Anterior Vitreous Normal Normal       Fundus Exam      Right Left   Posterior Vitreous Posterior vitreous detachment    Disc Normal    C/D Ratio 0.5    Macula Macular thickening, Cystoid macular edema, Microaneurysms, Atrophy, Retinal pigment epithelial atrophy, Drusen, Soft drusen, Intraretinal hemorrhage    Vessels Old central retinal vein occlusion intraretinal hemorrhages    Periphery Normal           IMAGING AND PROCEDURES  Imaging and Procedures for 02/07/20  OCT, Retina - OU - Both Eyes       Right Eye Quality was good. Scan locations included subfoveal. Central Foveal Thickness: 970. Progression has worsened. Findings include cystoid macular edema, abnormal foveal contour.   Left Eye Quality was good. Scan locations included subfoveal. Central Foveal Thickness: 273. Progression has improved. Findings include abnormal foveal contour, inner retinal atrophy, outer retinal atrophy, central retinal atrophy.   Notes Massive CME secondary to CRV O, sensitive to Eylea in the past, will repeat injection today, currently at 10-week interval and attempt visit in 8 weeks       Intravitreal Injection, Pharmacologic Agent - OD - Right  Eye       Time Out 02/07/2020. 3:46 PM. Confirmed correct patient, procedure, site, and patient consented.   Anesthesia Topical anesthesia was used. Anesthetic medications included Akten 3.5%.   Procedure Preparation included Ofloxacin , 5% betadine to ocular surface, 10% betadine to eyelids. A 30 gauge needle was used.   Injection:  2 mg aflibercept Alfonse Flavors) SOLN   NDC: [id number], Lot: [id number]   Route: Intravitreal, Site: Right Eye, Waste: 0 mg  Post-op Post injection exam found visual acuity of at least counting fingers. The patient tolerated the procedure well. There were no complications. The patient received written and verbal post procedure care education. Post injection medications were not given.                 ASSESSMENT/PLAN:  No problem-specific Assessment & Plan notes found for this encounter.      ICD-10-CM   1. Central retinal vein occlusion with macular edema of right eye  H34.8110 OCT, Retina - OU - Both Eyes    Intravitreal Injection, Pharmacologic Agent - OD - Right Eye    aflibercept (EYLEA) SOLN 2 mg    1.  Active CME from CRV O OD, will repeat injection today at 10-week interval and shorten interval examination is next 8 weeks in order to maintain function and prevent progression of disease OD  2.  We will schedule follow-up left eye also in 6 to 7 weeks 3.  Ophthalmic Meds Ordered this visit:  Meds ordered this encounter  Medications  . aflibercept (EYLEA) SOLN 2 mg       Return in about 8 weeks (around 04/03/2020) for dilate, OD, EYLEA OCT.  There are no Patient Instructions on file for this visit.   Explained the diagnoses, plan, and follow up with the patient  and they expressed understanding.  Patient expressed understanding of the importance of proper follow up care.   Clent Demark Andee Chivers M.D. Diseases & Surgery of the Retina and Vitreous [facility] [place] 02/07/20     Abbreviations: M myopia (nearsighted); A  astigmatism; H hyperopia (farsighted); P presbyopia; Mrx spectacle prescription;  CTL contact lenses; OD right eye; OS left eye; OU both eyes  XT exotropia; ET esotropia; PEK punctate epithelial keratitis; PEE punctate epithelial erosions; DES dry eye syndrome; MGD meibomian gland dysfunction; ATs artificial tears; PFAT's preservative free artificial tears; [place] nuclear sclerotic cataract; PSC posterior subcapsular cataract; ERM epi-retinal membrane; PVD posterior vitreous detachment; RD retinal detachment; DM diabetes mellitus; DR diabetic retinopathy; NPDR non-proliferative diabetic retinopathy; PDR proliferative diabetic retinopathy; CSME clinically significant macular edema; DME diabetic macular edema; dbh dot blot hemorrhages; CWS cotton wool spot; POAG primary open angle glaucoma; C/D cup-to-disc ratio; HVF humphrey visual field; GVF goldmann visual field; OCT optical coherence tomography; IOP intraocular pressure; BRVO Branch retinal vein occlusion; CRVO central retinal vein occlusion; CRAO central retinal artery occlusion; BRAO branch retinal artery occlusion; RT retinal tear; SB scleral buckle; PPV pars plana vitrectomy; VH Vitreous hemorrhage; PRP panretinal laser photocoagulation; IVK intravitreal kenalog; VMT vitreomacular traction; MH Macular hole;  NVD neovascularization of the disc; NVE neovascularization elsewhere; AREDS age related eye disease study; ARMD age related macular degeneration; POAG primary open angle glaucoma; EBMD epithelial/anterior basement membrane dystrophy; ACIOL anterior chamber intraocular lens; IOL intraocular lens; PCIOL posterior chamber intraocular lens; Phaco/IOL phacoemulsification with intraocular lens placement; [place] photorefractive keratectomy; LASIK laser assisted in situ keratomileusis; HTN hypertension; DM diabetes mellitus; COPD chronic obstructive pulmonary disease

## 2020-02-19 ENCOUNTER — Telehealth: Payer: Self-pay | Admitting: *Deleted

## 2020-02-19 NOTE — Telephone Encounter (Signed)
Schedule AWV for him and wife Chad Russell

## 2020-03-20 NOTE — Telephone Encounter (Signed)
Left message on patient's voicemail to schedule follow up appointment to review lab results.

## 2020-03-27 ENCOUNTER — Other Ambulatory Visit: Payer: Self-pay

## 2020-03-27 ENCOUNTER — Encounter (INDEPENDENT_AMBULATORY_CARE_PROVIDER_SITE_OTHER): Payer: Self-pay | Admitting: Ophthalmology

## 2020-03-27 ENCOUNTER — Ambulatory Visit (INDEPENDENT_AMBULATORY_CARE_PROVIDER_SITE_OTHER): Payer: Medicare HMO | Admitting: Ophthalmology

## 2020-03-27 DIAGNOSIS — H3582 Retinal ischemia: Secondary | ICD-10-CM

## 2020-03-27 DIAGNOSIS — H353221 Exudative age-related macular degeneration, left eye, with active choroidal neovascularization: Secondary | ICD-10-CM | POA: Diagnosis not present

## 2020-03-27 DIAGNOSIS — H34811 Central retinal vein occlusion, right eye, with macular edema: Secondary | ICD-10-CM | POA: Diagnosis not present

## 2020-03-27 DIAGNOSIS — H3522 Other non-diabetic proliferative retinopathy, left eye: Secondary | ICD-10-CM | POA: Diagnosis not present

## 2020-03-27 MED ORDER — AFLIBERCEPT 2MG/0.05ML IZ SOLN FOR KALEIDOSCOPE
2.0000 mg | INTRAVITREAL | Status: AC | PRN
  Administered 2020-03-27: 2 mg via INTRAVITREAL

## 2020-03-27 NOTE — Assessment & Plan Note (Signed)
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

## 2020-03-27 NOTE — Progress Notes (Signed)
03/27/2020     CHIEF COMPLAINT Patient presents for Retina Follow Up (7 Week Wet AMD f\u OS. Possible Eylea OS. OCT/Pt feels vision has improved. Denies new complaints.)   HISTORY OF PRESENT ILLNESS: Chad Russell is a 87 y.o. male who presents to the clinic today for:   HPI    Retina Follow Up    Patient presents with  Wet AMD.  In left eye.  Severity is moderate.  Duration of 7 weeks.  Since onset it is stable.  I, the attending physician,  performed the HPI with the patient and updated documentation appropriately. Additional comments: 7 Week Wet AMD f\u OS. Possible Eylea OS. OCT Pt feels vision has improved. Denies new complaints.          Comments    Examination in wheelchair       Last edited by Hurman Horn, MD on 03/27/2020  2:27 PM. (History)      Referring physician: [name], MD [address] [place],  [place] [address]  HISTORICAL INFORMATION:   Selected notes from the MEDICAL RECORD NUMBER       CURRENT MEDICATIONS: Current Outpatient Medications (Ophthalmic Drugs)  Medication Sig  . timolol (TIMOPTIC) 0.5 % ophthalmic solution PLACE ONE DROP IN [facility] EYE ONCE DAILY   No current facility-administered medications for this visit. (Ophthalmic Drugs)   Current Outpatient Medications (Other)  Medication Sig  . acetaminophen (TYLENOL) 500 MG tablet Take 1 tablet (500 mg total) by mouth every 8 (eight) hours.  . bisacodyl (DULCOLAX) 5 MG EC tablet Take 1 tablet (5 mg total) by mouth daily as needed for moderate constipation.  . carbidopa-levodopa (SINEMET) 25-100 MG tablet Take 1.5 tablets by mouth in the morning and at bedtime.  Marland Kitchen lisinopril-hydrochlorothiazide (ZESTORETIC) 10-12.5 MG tablet TAKE ONE TABLET BY MOUTH DAILY  . Misc Natural Products (PROSTATE) CAPS Take 1 tablet daily by mouth. Prostate Plus otc supplement  . Multiple Vitamin (MULTIVITAMIN WITH MINERALS) TABS tablet Take 1 tablet daily by mouth.  . Multiple Vitamins-Minerals  (PRESERVISION AREDS 2+MULTI VIT PO) Take 1 capsule by mouth 2 (two) times daily.  . rivaroxaban (XARELTO) 20 MG TABS tablet TAKE ONE TABLET BY MOUTH DAILY WITH SUPPER  . senna (SENOKOT) 8.6 MG TABS tablet Take 1 tablet by mouth daily as needed for mild constipation.  . vitamin C (ASCORBIC ACID) 500 MG tablet Take 500 mg by mouth daily.   No current facility-administered medications for this visit. (Other)      REVIEW OF SYSTEMS:    ALLERGIES Allergies  Allergen Reactions  . Doxycycline Other (See Comments)    Upset stomach  . Keflex [Cephalexin] Other (See Comments)    Abdominal discomfort 04-19-17 Pt has tolerated orally    PAST MEDICAL HISTORY Past Medical History:  Diagnosis Date  . Arthritis   . Atrial fibrillation ([place])   . Balance problem 10/31/2013  . BPH (benign prostatic hyperplasia) 2014   Urology Dr [name] with [facility] [phone]   . Bradycardia    a. baseline HR in the 30's. Asymptomatic - no history of PPM placement.   Marland Kitchen CAD (coronary artery disease)    LHC 03/16/11: Mid LAD 10-20%, proximal circumflex 10%, mid RCA 20%, EF 55%.  . Cataract   . Complete heart block ([place])   . Fracture of femoral neck, right ([place]) 04/19/2017  . GERD (gastroesophageal reflux disease)    takes occasional  zantac  . Hx of echocardiogram    Echo 02/2011: EF  65-70%, normal wall motion, MAC, mild MR, mild LAE, mild RVE  . Hypertension   . OSA (obstructive sleep apnea)    per patient went to have sleep study done about 2 years ago, bu they never F/U with him about results; but wife reports he still has periods of apnea at night   . Talar fracture    casted no surg   Past Surgical History:  Procedure Laterality Date  . ABDOMINAL AORTOGRAM W/LOWER EXTREMITY N/A 01/17/2017   Procedure: ABDOMINAL AORTOGRAM W/LOWER EXTREMITY;  Surgeon: Angelia Mould, MD;  Location: [facility];  Service: Cardiovascular;  Laterality: N/A;  . COLONOSCOPY     Repeated and normal in 2011  .  FRACTURE SURGERY    . HEMIARTHROPLASTY HIP Right 04/19/2017  . HEMORROIDECTOMY    . HIP ARTHROPLASTY Right 04/19/2017   Procedure: ARTHROPLASTY BIPOLAR HIP (HEMIARTHROPLASTY);  Surgeon: Marchia Bond, MD;  Location: [facility];  Service: Orthopedics;  Laterality: Right;  . HIP ARTHROPLASTY Left 12/16/2018   Procedure: ARTHROPLASTY BIPOLAR HIP (HEMIARTHROPLASTY);  Surgeon: Altamese Chunky, MD;  Location: [facility];  Service: Orthopedics;  Laterality: Left;  . INGUINAL HERNIA REPAIR Right 02/26/2014   Procedure: LAPAROSCOPIC RIGHT INGUINAL HERNIA REPAIR;  Surgeon: Ralene Ok, MD;  Location: [place];  Service: General;  Laterality: Right;  . INGUINAL HERNIA REPAIR    . INSERTION OF MESH Right 02/26/2014   Procedure: INSERTION OF MESH;  Surgeon: Ralene Ok, MD;  Location: [facility];  Service: General;  Laterality: Right;  . LEFT HEART CATHETERIZATION WITH CORONARY ANGIOGRAM N/A 03/16/2011   Procedure: LEFT HEART CATHETERIZATION WITH CORONARY ANGIOGRAM;  Surgeon: Peter M Martinique, MD;  Location: [facility] C[facility];  Service: Cardiovascular;  Laterality: N/A;  . PROSTATE BIOPSY     negative - [facility]  . TRANSURETHRAL RESECTION OF PROSTATE N/A 09/07/2016   Procedure: TRANSURETHRAL RESECTION OF THE PROSTATE (TURP);  Surgeon: Irine Seal, MD;  Location: [facility];  Service: Urology;  Laterality: N/A;    FAMILY HISTORY Family History  Problem Relation Age of Onset  . Hypertension Mother   . Multiple sclerosis Sister     SOCIAL HISTORY Social History   Tobacco Use  . Smoking status: Former Smoker    Packs/day: 1.00    Years: 3.00    Pack years: 3.00    Types: Cigarettes    Quit date: 03/17/1955    Years since quitting: 65.0  . Smokeless tobacco: Former Network engineer  . Vaping Use: Never used  Substance Use Topics  . Alcohol use: Yes    Alcohol/week: 14.0 standard drinks    Types: 7 Cans of beer, 7 Standard drinks or equivalent per week    Comment: daily  . Drug use: No         OPHTHALMIC  EXAM:  Base Eye Exam    Visual Acuity (Snellen - Linear)      Right Left   Dist cc CF @ 3' 20/100 -1   Dist ph cc NI NI   Correction: Glasses       Tonometry (Tonopen, 1:41 PM)      Right Left   Pressure 14 17       Pupils      Pupils Dark Light Shape React APD   Right PERRL 3 3 Round Minimal None   Left PERRL 3 3 Round Minimal None       Visual Fields (Counting fingers)      Left Right     Full  Restrictions Total superior temporal, inferior temporal, superior nasal, inferior nasal deficiencies        Neuro/Psych    Oriented x3: Yes   Mood/Affect: Normal       Dilation    Left eye: 1.0% Mydriacyl, 2.5% Phenylephrine @ 1:41 PM        Slit Lamp and Fundus Exam    External Exam      Right Left   External Normal Normal       Slit Lamp Exam      Right Left   Lids/Lashes Normal Normal   Conjunctiva/Sclera White and quiet White and quiet   Cornea Clear Clear   Anterior Chamber Deep and quiet Deep and quiet   Iris Round and reactive Round and reactive   Lens Posterior chamber intraocular lens Posterior chamber intraocular lens   Anterior Vitreous Normal Normal       Fundus Exam      Right Left   Posterior Vitreous  Posterior vitreous detachment   Disc  Pallor 2+   C/D Ratio  0.75   Macula  Geographic atrophy, Soft drusen,no hemorrhage   Vessels  Normal   Periphery  Normal          IMAGING AND PROCEDURES  Imaging and Procedures for 03/27/20  OCT, Retina - OU - Both Eyes       Right Eye Quality was good. Scan locations included subfoveal. Central Foveal Thickness: 970. Progression has worsened. Findings include cystoid macular edema, abnormal foveal contour.   Left Eye Quality was good. Scan locations included subfoveal. Central Foveal Thickness: 295. Progression has improved. Findings include abnormal foveal contour, inner retinal atrophy, outer retinal atrophy, central retinal atrophy, retinal drusen , subretinal hyper-reflective material,  subretinal scarring, no SRF.   Notes Massive CME secondary to CRV O, sensitive to Eylea in the past, OD, less then compared to 7 weeks previous.  Still chronic still active poor visual acuity prognosis.  Currently at 7-week follow-up  OS, much less active CNVM, chronic subfoveal scarring limits the acuity OS yet no progression of intraretinal or subretinal fluid.  Repeat injection Eylea OS today       Intravitreal Injection, Pharmacologic Agent - OS - Left Eye       Time Out 03/27/2020. 2:28 PM. Confirmed correct patient, procedure, site, and patient consented.   Anesthesia Topical anesthesia was used. Anesthetic medications included Akten 3.5%.   Procedure Preparation included 10% betadine to eyelids, 5% betadine to ocular surface, Ofloxacin . A 30 gauge needle was used.   Injection:  2 mg aflibercept Alfonse Flavors) SOLN   NDC: [id number], Lot: [id number]   Route: Intravitreal, Site: Left Eye, Waste: 0 mg  Post-op Post injection exam found visual acuity of at least counting fingers. The patient tolerated the procedure well. There were no complications. The patient received written and verbal post procedure care education. Post injection medications were not given.                 ASSESSMENT/PLAN:  Ocular ischemic syndrome Previous ocular ischemic syndrome with neovascular tissue in the past controlled by PRP  Central retinal vein occlusion with macular edema of right eye Chronic CME, improved however at 7-week follow-up today, as an OCT finding on exam.  Exudative age-related macular degeneration of left eye with active choroidal neovascularization ([place]) Much less disease activity from CNVM left eye centrally.  Currently at 7-week follow-up.  Best acuity vision in this eye.  Repeat injection today and examination in 7 weeks  ICD-10-CM   1. Exudative age-related macular degeneration of left eye with active choroidal neovascularization (H[facility])  H35.3221 OCT, Retina - OU  - Both Eyes    Intravitreal Injection, Pharmacologic Agent - OS - Left Eye    aflibercept (EYLEA) SOLN 2 mg  2. Nondiabetic proliferative retinopathy, left  H35.22   3. Ocular ischemic syndrome  H35.82   4. Central retinal vein occlusion with macular edema of right eye  H34.8110     1.  Less active CNVM OS at 7-week follow-up.  Repeat injection OS today vitreal Eylea in order to preserve best vision function eye.  2.  History of ocular ischemia neovascular disease in the right eye from central retinal vein occlusion.  Status post PRP yet chronic CME.  Follow-up examination dilate next as scheduled  3.  Ophthalmic Meds Ordered this visit:  Meds ordered this encounter  Medications  . aflibercept (EYLEA) SOLN 2 mg       Return in about 7 weeks (around 05/15/2020) for dilate, OS, EYLEA OCT.  There are no Patient Instructions on file for this visit.   Explained the diagnoses, plan, and follow up with the patient and they expressed understanding.  Patient expressed understanding of the importance of proper follow up care.   Clent Demark Mumin Denomme M.D. Diseases & Surgery of the Retina and Vitreous [facility] [place] 03/27/20     Abbreviations: M myopia (nearsighted); A astigmatism; H hyperopia (farsighted); P presbyopia; Mrx spectacle prescription;  CTL contact lenses; OD right eye; OS left eye; OU both eyes  XT exotropia; ET esotropia; PEK punctate epithelial keratitis; PEE punctate epithelial erosions; DES dry eye syndrome; MGD meibomian gland dysfunction; ATs artificial tears; PFAT's preservative free artificial tears; [place] nuclear sclerotic cataract; PSC posterior subcapsular cataract; ERM epi-retinal membrane; PVD posterior vitreous detachment; RD retinal detachment; DM diabetes mellitus; DR diabetic retinopathy; NPDR non-proliferative diabetic retinopathy; PDR proliferative diabetic retinopathy; CSME clinically significant macular edema; DME diabetic macular edema; dbh dot blot  hemorrhages; CWS cotton wool spot; POAG primary open angle glaucoma; C/D cup-to-disc ratio; HVF humphrey visual field; GVF goldmann visual field; OCT optical coherence tomography; IOP intraocular pressure; BRVO Branch retinal vein occlusion; CRVO central retinal vein occlusion; CRAO central retinal artery occlusion; BRAO branch retinal artery occlusion; RT retinal tear; SB scleral buckle; PPV pars plana vitrectomy; VH Vitreous hemorrhage; PRP panretinal laser photocoagulation; IVK intravitreal kenalog; VMT vitreomacular traction; MH Macular hole;  NVD neovascularization of the disc; NVE neovascularization elsewhere; AREDS age related eye disease study; ARMD age related macular degeneration; POAG primary open angle glaucoma; EBMD epithelial/anterior basement membrane dystrophy; ACIOL anterior chamber intraocular lens; IOL intraocular lens; PCIOL posterior chamber intraocular lens; Phaco/IOL phacoemulsification with intraocular lens placement; [place] photorefractive keratectomy; LASIK laser assisted in situ keratomileusis; HTN hypertension; DM diabetes mellitus; COPD chronic obstructive pulmonary disease

## 2020-03-27 NOTE — Assessment & Plan Note (Signed)
Much less disease activity from CNVM left eye centrally.  Currently at 7-week follow-up.  Best acuity vision in this eye.  Repeat injection today and examination in 7 weeks

## 2020-03-27 NOTE — Assessment & Plan Note (Signed)
Chronic CME, improved however at 7-week follow-up today, as an OCT finding on exam.

## 2020-04-02 ENCOUNTER — Telehealth: Payer: Self-pay | Admitting: Neurology

## 2020-04-03 ENCOUNTER — Other Ambulatory Visit: Payer: Self-pay

## 2020-04-03 ENCOUNTER — Encounter (INDEPENDENT_AMBULATORY_CARE_PROVIDER_SITE_OTHER): Payer: Self-pay | Admitting: Ophthalmology

## 2020-04-03 ENCOUNTER — Ambulatory Visit (INDEPENDENT_AMBULATORY_CARE_PROVIDER_SITE_OTHER): Payer: Medicare HMO | Admitting: Ophthalmology

## 2020-04-03 DIAGNOSIS — H348111 Central retinal vein occlusion, right eye, with retinal neovascularization: Secondary | ICD-10-CM | POA: Diagnosis not present

## 2020-04-03 DIAGNOSIS — H34811 Central retinal vein occlusion, right eye, with macular edema: Secondary | ICD-10-CM | POA: Diagnosis not present

## 2020-04-03 MED ORDER — AFLIBERCEPT 2MG/0.05ML IZ SOLN FOR KALEIDOSCOPE
2.0000 mg | INTRAVITREAL | Status: AC | PRN
Start: 2020-04-03 — End: 2020-04-03
  Administered 2020-04-03: 2 mg via INTRAVITREAL

## 2020-04-03 NOTE — Assessment & Plan Note (Signed)
Diffuse chronic CME centrally OD controlled yet not resolvable on intravitreal antivegF, peripheral PRP stabilizes the neovascular component of ischemic disease

## 2020-04-03 NOTE — Assessment & Plan Note (Signed)
Good peripheral PRP OD

## 2020-04-03 NOTE — Progress Notes (Signed)
04/03/2020     CHIEF COMPLAINT Patient presents for Retina Follow Up (8 Week CRVO f\u OD. Possible Eylea OD. OCT/Pt feels vision has improved. Denies complaints.)   HISTORY OF PRESENT ILLNESS: Chad Russell is a 87 y.o. male who presents to the clinic today for:   HPI    Retina Follow Up    Patient presents with  CRVO/BRVO.  In right eye.  Severity is moderate.  Duration of 8 weeks.  Since onset it is stable.  I, the attending physician,  performed the HPI with the patient and updated documentation appropriately. Additional comments: 8 Week CRVO f\u OD. Possible Eylea OD. OCT Pt feels vision has improved. Denies complaints.       Last edited by Tilda Franco on 04/03/2020  1:30 PM. (History)      Referring physician: [name], MD [address] [place],  [place] [address]  HISTORICAL INFORMATION:   Selected notes from the MEDICAL RECORD NUMBER       CURRENT MEDICATIONS: Current Outpatient Medications (Ophthalmic Drugs)  Medication Sig  . timolol (TIMOPTIC) 0.5 % ophthalmic solution PLACE ONE DROP IN [facility] EYE ONCE DAILY   No current facility-administered medications for this visit. (Ophthalmic Drugs)   Current Outpatient Medications (Other)  Medication Sig  . acetaminophen (TYLENOL) 500 MG tablet Take 1 tablet (500 mg total) by mouth every 8 (eight) hours.  . bisacodyl (DULCOLAX) 5 MG EC tablet Take 1 tablet (5 mg total) by mouth daily as needed for moderate constipation.  . carbidopa-levodopa (SINEMET) 25-100 MG tablet Take 1.5 tablets by mouth in the morning and at bedtime.  Marland Kitchen lisinopril-hydrochlorothiazide (ZESTORETIC) 10-12.5 MG tablet TAKE ONE TABLET BY MOUTH DAILY  . Misc Natural Products (PROSTATE) CAPS Take 1 tablet daily by mouth. Prostate Plus otc supplement  . Multiple Vitamin (MULTIVITAMIN WITH MINERALS) TABS tablet Take 1 tablet daily by mouth.  . Multiple Vitamins-Minerals (PRESERVISION AREDS 2+MULTI VIT PO) Take 1 capsule by mouth 2 (two)  times daily.  . rivaroxaban (XARELTO) 20 MG TABS tablet TAKE ONE TABLET BY MOUTH DAILY WITH SUPPER  . senna (SENOKOT) 8.6 MG TABS tablet Take 1 tablet by mouth daily as needed for mild constipation.  . vitamin C (ASCORBIC ACID) 500 MG tablet Take 500 mg by mouth daily.   No current facility-administered medications for this visit. (Other)      REVIEW OF SYSTEMS:    ALLERGIES Allergies  Allergen Reactions  . Doxycycline Other (See Comments)    Upset stomach  . Keflex [Cephalexin] Other (See Comments)    Abdominal discomfort 04-19-17 Pt has tolerated orally    PAST MEDICAL HISTORY Past Medical History:  Diagnosis Date  . Arthritis   . Atrial fibrillation ([place])   . Balance problem 10/31/2013  . BPH (benign prostatic hyperplasia) 2014   Urology Dr [name] with [facility] [phone]   . Bradycardia    a. baseline HR in the 30's. Asymptomatic - no history of PPM placement.   Marland Kitchen CAD (coronary artery disease)    LHC 03/16/11: Mid LAD 10-20%, proximal circumflex 10%, mid RCA 20%, EF 55%.  . Cataract   . Complete heart block ([place])   . Fracture of femoral neck, right ([place]) 04/19/2017  . GERD (gastroesophageal reflux disease)    takes occasional  zantac  . Hx of echocardiogram    Echo 02/2011: EF 65-70%, normal wall motion, MAC, mild MR, mild LAE, mild RVE  . Hypertension   . OSA (obstructive sleep apnea)  per patient went to have sleep study done about 2 years ago, bu they never F/U with him about results; but wife reports he still has periods of apnea at night   . Talar fracture    casted no surg   Past Surgical History:  Procedure Laterality Date  . ABDOMINAL AORTOGRAM W/LOWER EXTREMITY N/A 01/17/2017   Procedure: ABDOMINAL AORTOGRAM W/LOWER EXTREMITY;  Surgeon: Angelia Mould, MD;  Location: [place] [facility];  Service: Cardiovascular;  Laterality: N/A;  . COLONOSCOPY     Repeated and normal in 2011  . FRACTURE SURGERY    . HEMIARTHROPLASTY HIP Right 04/19/2017  .  HEMORROIDECTOMY    . HIP ARTHROPLASTY Right 04/19/2017   Procedure: ARTHROPLASTY BIPOLAR HIP (HEMIARTHROPLASTY);  Surgeon: Marchia Bond, MD;  Location: [place];  Service: Orthopedics;  Laterality: Right;  . HIP ARTHROPLASTY Left 12/16/2018   Procedure: ARTHROPLASTY BIPOLAR HIP (HEMIARTHROPLASTY);  Surgeon: Altamese Verona, MD;  Location: [place];  Service: Orthopedics;  Laterality: Left;  . INGUINAL HERNIA REPAIR Right 02/26/2014   Procedure: LAPAROSCOPIC RIGHT INGUINAL HERNIA REPAIR;  Surgeon: Ralene Ok, MD;  Location: [place];  Service: General;  Laterality: Right;  . INGUINAL HERNIA REPAIR    . INSERTION OF MESH Right 02/26/2014   Procedure: INSERTION OF MESH;  Surgeon: Ralene Ok, MD;  Location: [place];  Service: General;  Laterality: Right;  . LEFT HEART CATHETERIZATION WITH CORONARY ANGIOGRAM N/A 03/16/2011   Procedure: LEFT HEART CATHETERIZATION WITH CORONARY ANGIOGRAM;  Surgeon: Peter M Martinique, MD;  Location: [facility] C[facility];  Service: Cardiovascular;  Laterality: N/A;  . PROSTATE BIOPSY     negative - [facility]  . TRANSURETHRAL RESECTION OF PROSTATE N/A 09/07/2016   Procedure: TRANSURETHRAL RESECTION OF THE PROSTATE (TURP);  Surgeon: Irine Seal, MD;  Location: [facility];  Service: Urology;  Laterality: N/A;    FAMILY HISTORY Family History  Problem Relation Age of Onset  . Hypertension Mother   . Multiple sclerosis Sister     SOCIAL HISTORY Social History   Tobacco Use  . Smoking status: Former Smoker    Packs/day: 1.00    Years: 3.00    Pack years: 3.00    Types: Cigarettes    Quit date: 03/17/1955    Years since quitting: 65.0  . Smokeless tobacco: Former Network engineer  . Vaping Use: Never used  Substance Use Topics  . Alcohol use: Yes    Alcohol/week: 14.0 standard drinks    Types: 7 Cans of beer, 7 Standard drinks or equivalent per week    Comment: daily  . Drug use: No         OPHTHALMIC EXAM: Base Eye Exam    Visual Acuity (Snellen - Linear)       Right Left   Dist cc CF @ 3' 20/200 +1   Dist ph cc NI NI   Correction: Glasses       Tonometry (Tonopen, 1:37 PM)      Right Left   Pressure 12 14       Pupils      Pupils Dark Light Shape React APD   Right PERRL 3 2 Round Sluggish None   Left PERRL 3 3 Round Minimal None       Visual Fields (Counting fingers)      Left Right   Restrictions Total superior temporal, inferior temporal, superior nasal, inferior nasal deficiencies Partial outer inferior nasal deficiency       Neuro/Psych    Oriented x3:  Yes   Mood/Affect: Normal       Dilation    Right eye: 1.0% Mydriacyl, 2.5% Phenylephrine @ 1:37 PM        Slit Lamp and Fundus Exam    External Exam      Right Left   External Normal Normal       Slit Lamp Exam      Right Left   Lids/Lashes Normal Normal   Conjunctiva/Sclera White and quiet White and quiet   Cornea Clear Clear   Anterior Chamber Deep and quiet Deep and quiet   Iris Round and reactive Round and reactive   Lens Posterior chamber intraocular lens Posterior chamber intraocular lens   Anterior Vitreous Normal Normal       Fundus Exam      Right Left   Posterior Vitreous Posterior vitreous detachment    Disc Normal    C/D Ratio 0.5    Macula Macular thickening, Cystoid macular edema, Microaneurysms, Atrophy, Retinal pigment epithelial atrophy, Drusen, Soft drusen, Intraretinal hemorrhage    Vessels Old central retinal vein occlusion intraretinal hemorrhages    Periphery Normal           IMAGING AND PROCEDURES  Imaging and Procedures for 04/03/20  OCT, Retina - OU - Both Eyes       Right Eye Quality was good. Scan locations included subfoveal. Central Foveal Thickness: 787. Progression has worsened. Findings include cystoid macular edema, abnormal foveal contour.   Left Eye Quality was good. Scan locations included subfoveal. Central Foveal Thickness: 266. Progression has improved. Findings include abnormal foveal contour, inner retinal  atrophy, outer retinal atrophy, central retinal atrophy, retinal drusen , subretinal hyper-reflective material, subretinal scarring, no SRF.   Notes Massive CME secondary to CRV O, sensitive to Eylea in the past, OD, less then compared to 8 weeks previous.  Still chronic still active poor visual acuity prognosis.  Currently at 7-week follow-up  OS, much less active CNVM, chronic subfoveal scarring limits the acuity OS yet no progression of intraretinal or subretinal fluid.,         Intravitreal Injection, Pharmacologic Agent - OD - Right Eye       Time Out 04/03/2020. 2:30 PM. Confirmed correct patient, procedure, site, and patient consented.   Anesthesia Topical anesthesia was used. Anesthetic medications included Akten 3.5%.   Procedure Preparation included Ofloxacin , 5% betadine to ocular surface, 10% betadine to eyelids. A 30 gauge needle was used.   Injection:  2 mg aflibercept Alfonse Flavors) SOLN   NDC: [id number], Lot: [id number]   Route: Intravitreal, Site: Right Eye, Waste: 0 mg  Post-op Post injection exam found visual acuity of at least counting fingers. The patient tolerated the procedure well. There were no complications. The patient received written and verbal post procedure care education. Post injection medications were not given.                 ASSESSMENT/PLAN:  Central retinal vein occlusion with macular edema of right eye Diffuse chronic CME centrally OD controlled yet not resolvable on intravitreal antivegF, peripheral PRP stabilizes the neovascular component of ischemic disease  Central retinal vein occlusion with neovascularization of right eye Good peripheral PRP OD      ICD-10-CM   1. Central retinal vein occlusion with macular edema of right eye  H34.8110 OCT, Retina - OU - Both Eyes    Intravitreal Injection, Pharmacologic Agent - OD - Right Eye    aflibercept (EYLEA) SOLN 2 mg  2. Central retinal  vein occlusion with neovascularization of  right eye  H34.8111     1.  Chronic ischemic central retinal vein occlusion right eye, with neovascular component stabilized by peripheral PRP yet still with chronic advanced atrophic CME.  Does improve the condition right eye to use intravitreal Eylea at some interval.  But not much difference here at 8-week interval.  Repeat injection today and examination again right eye in 10 weeks  2.  OS, much less active CNVM since onset of use of intravitreal Eylea and stabilization of acuity will continue to follow-up and dilate OS next as already scheduled  3.  Ophthalmic Meds Ordered this visit:  Meds ordered this encounter  Medications  . aflibercept (EYLEA) SOLN 2 mg       Return in about 10 weeks (around 06/12/2020) for dilate, OD, EYLEA OCT.  There are no Patient Instructions on file for this visit.   Explained the diagnoses, plan, and follow up with the patient and they expressed understanding.  Patient expressed understanding of the importance of proper follow up care.   Clent Demark Khushbu Pippen M.D. Diseases & Surgery of the Retina and Vitreous Retina & Diabetic [place] 04/03/20     Abbreviations: M myopia (nearsighted); A astigmatism; H hyperopia (farsighted); P presbyopia; Mrx spectacle prescription;  CTL contact lenses; OD right eye; OS left eye; OU both eyes  XT exotropia; ET esotropia; PEK punctate epithelial keratitis; PEE punctate epithelial erosions; DES dry eye syndrome; MGD meibomian gland dysfunction; ATs artificial tears; PFAT's preservative free artificial tears; [place] nuclear sclerotic cataract; PSC posterior subcapsular cataract; ERM epi-retinal membrane; PVD posterior vitreous detachment; RD retinal detachment; DM diabetes mellitus; DR diabetic retinopathy; NPDR non-proliferative diabetic retinopathy; PDR proliferative diabetic retinopathy; CSME clinically significant macular edema; DME diabetic macular edema; dbh dot blot hemorrhages; CWS cotton wool spot; POAG primary open  angle glaucoma; C/D cup-to-disc ratio; HVF humphrey visual field; GVF goldmann visual field; OCT optical coherence tomography; IOP intraocular pressure; BRVO Branch retinal vein occlusion; CRVO central retinal vein occlusion; CRAO central retinal artery occlusion; BRAO branch retinal artery occlusion; RT retinal tear; SB scleral buckle; PPV pars plana vitrectomy; VH Vitreous hemorrhage; PRP panretinal laser photocoagulation; IVK intravitreal kenalog; VMT vitreomacular traction; MH Macular hole;  NVD neovascularization of the disc; NVE neovascularization elsewhere; AREDS age related eye disease study; ARMD age related macular degeneration; POAG primary open angle glaucoma; EBMD epithelial/anterior basement membrane dystrophy; ACIOL anterior chamber intraocular lens; IOL intraocular lens; PCIOL posterior chamber intraocular lens; Phaco/IOL phacoemulsification with intraocular lens placement; [place] photorefractive keratectomy; LASIK laser assisted in situ keratomileusis; HTN hypertension; DM diabetes mellitus; COPD chronic obstructive pulmonary disease

## 2020-04-21 ENCOUNTER — Ambulatory Visit (INDEPENDENT_AMBULATORY_CARE_PROVIDER_SITE_OTHER): Payer: Medicare HMO | Admitting: Family Medicine

## 2020-04-21 ENCOUNTER — Encounter: Payer: Self-pay | Admitting: Family Medicine

## 2020-04-21 ENCOUNTER — Other Ambulatory Visit: Payer: Self-pay

## 2020-04-21 VITALS — BP 140/69 | HR 66 | Temp 98.0°F | Ht 73.0 in | Wt 176.0 lb

## 2020-04-21 DIAGNOSIS — I739 Peripheral vascular disease, unspecified: Secondary | ICD-10-CM | POA: Diagnosis not present

## 2020-04-21 DIAGNOSIS — L84 Corns and callosities: Secondary | ICD-10-CM | POA: Diagnosis not present

## 2020-04-21 DIAGNOSIS — M79671 Pain in right foot: Secondary | ICD-10-CM | POA: Diagnosis not present

## 2020-04-21 NOTE — Patient Instructions (Addendum)
  I will refer you to vascular specialist. Try to keep pressure off of heels, with "floating heels", at night.  I do not see any bumps behind the ear at this time, and calves appear to be equal.  If any return of calf pain be seen, and if new bumps or swelling seen on the scalp I am happy to follow-up and recheck those areas.  If any wounds, redness or discharge at the heel be seen right away.   If you have lab work done today you will be contacted with your lab results within the next 2 weeks.  If you have not heard from [place] then please contact us. The fastest way to get your results is to register for My Chart.   IF you received an x-ray today, you will receive an invoice from [facility]. Please contact [facility] at [phone] with questions or concerns regarding your invoice.   IF you received labwork today, you will receive an invoice from [facility]. Please contact [facility] at [phone] with questions or concerns regarding your invoice.   Our billing staff will not be able to assist you with questions regarding bills from these companies.  You will be contacted with the lab results as soon as they are available. The fastest way to get your results is to activate your My Chart account. Instructions are located on the last page of this paperwork. If you have not heard from [place] regarding the results in 2 weeks, please contact this office.

## 2020-04-21 NOTE — Progress Notes (Signed)
Subjective:  Patient ID: Chad Russell, male    DOB: 05-Dec-1926  Age: 87 y.o. MRN: [id number]  CC:  Chief Complaint  Patient presents with  . Leg Pain    Pt had leg pain in both legs for about 2 weeks in the calf muscle. Pt's wife states she thinks the pain was from deep in the muscel. Pt states currently the pain is gone. Pt reported pain in his R heel that has been on going for the past year. Pt states he has some sensitivity in his L heel as well that has been happening for the same amount of time.  . Mass    Pts wife is concerned about a bump behind the Pt's R ear. Pt's wife states that the bump was sore before, but not so much any more.     HPI Chad Russell presents for   Leg pains: Bilateral lower leg pains in the past 2 weeks that are now improved,No fall/injury recently.  Here with wife - she notes he is sedentary, noted pain in both calf muscles. Pain has now resolved over past 5-6 days.  No chest pain/dyspnea. No hx of dvt. No recent prolonged car travel or air travel, no recent surgery. No calf swelling.  bilaterral heel pain.  ongoing pain R and L heel for a year or two.  Worse in R heel. No injury. Shuffling of legs d/t parkinsons. Unable to tolerate a boot to protect the heel. Callous on heel - no wounds.   [facility] in 2018 [facility]. peripheral vascular disease noted, chronic venous insufficiency, tibial artery occlusive disease. Plan for arteriography - 01/17/2017.  Anterior tibial artery disease some occlusion of the mid leg anterior on the left but other vessels with adequate circulation to heal the wound of the left ankle at that time.  Thought to be venous ulcer.  Leg elevation and moderate compression treatment planned at that time.    Mass behind R ear Noted about a month ago, sore initially, pain improved - still lump?  Tx: voltaren gel. Min relief.   History Patient Active Problem List   Diagnosis Date  Noted  . Educated about COVID-19 virus infection 11/29/2019  . Exudative age-related macular degeneration of left eye with active choroidal neovascularization ([place]) 10/02/2019  . Central retinal vein occlusion with macular edema of right eye 06/20/2019  . Central retinal vein occlusion with neovascularization of right eye 06/20/2019  . Ocular ischemic syndrome 06/20/2019  . Retinal hemorrhage of left eye 06/20/2019  . Retinal hemorrhage of right eye 06/20/2019  . AKI (acute kidney injury) ([place]) 12/15/2018  . Hematoma of scalp   . Heart block   . Chest pain 05/04/2018  . Fracture of femoral neck, left ([place]) 04/19/2017  . MGUS (monoclonal gammopathy of unknown significance) 01/15/2017  . BPH with obstruction/lower urinary tract symptoms 09/07/2016  . Preoperative cardiovascular examination 08/24/2016  . Urinary retention 05/13/2016  . Balance problem 10/31/2013  . OSA (obstructive sleep apnea) 10/31/2013  . BPH (benign prostatic hyperplasia) 08/28/2012  . Atrial fibrillation ([place]) 06/08/2012  . Hyperplasia of prostate with lower urinary tract symptoms (LUTS) 04/28/2012  . Moderate malnutrition ([place]) 04/28/2012  . Infection of urinary tract - recurrent 04/26/2012  . NSTEMI (non-ST elevated myocardial infarction) ([place]) 03/15/2011  . Complete heart block ([place]) 03/15/2011  . HTN (hypertension) 03/15/2011   Past Medical History:  Diagnosis Date  . Arthritis   . Atrial fibrillation ([place])   .  Balance problem 10/31/2013  . BPH (benign prostatic hyperplasia) 2014   Urology Dr [name] with [facility] [phone]   . Bradycardia    a. baseline HR in the 30's. Asymptomatic - no history of PPM placement.   Marland Kitchen CAD (coronary artery disease)    LHC 03/16/11: Mid LAD 10-20%, proximal circumflex 10%, mid RCA 20%, EF 55%.  . Cataract   . Complete heart block ([place])   . Fracture of femoral neck, right ([place]) 04/19/2017  . GERD (gastroesophageal reflux disease)    takes occasional  zantac  . Hx of  echocardiogram    Echo 02/2011: EF 65-70%, normal wall motion, MAC, mild MR, mild LAE, mild RVE  . Hypertension   . OSA (obstructive sleep apnea)    per patient went to have sleep study done about 2 years ago, bu they never F/U with him about results; but wife reports he still has periods of apnea at night   . Talar fracture    casted no surg   Past Surgical History:  Procedure Laterality Date  . ABDOMINAL AORTOGRAM W/LOWER EXTREMITY N/A 01/17/2017   Procedure: ABDOMINAL AORTOGRAM W/LOWER EXTREMITY;  Surgeon: Angelia Mould, MD;  Location: [facility];  Service: Cardiovascular;  Laterality: N/A;  . COLONOSCOPY     Repeated and normal in 2011  . FRACTURE SURGERY    . HEMIARTHROPLASTY HIP Right 04/19/2017  . HEMORROIDECTOMY    . HIP ARTHROPLASTY Right 04/19/2017   Procedure: ARTHROPLASTY BIPOLAR HIP (HEMIARTHROPLASTY);  Surgeon: Marchia Bond, MD;  Location: [facility];  Service: Orthopedics;  Laterality: Right;  . HIP ARTHROPLASTY Left 12/16/2018   Procedure: ARTHROPLASTY BIPOLAR HIP (HEMIARTHROPLASTY);  Surgeon: Altamese New Union, MD;  Location: [facility];  Service: Orthopedics;  Laterality: Left;  . INGUINAL HERNIA REPAIR Right 02/26/2014   Procedure: LAPAROSCOPIC RIGHT INGUINAL HERNIA REPAIR;  Surgeon: Ralene Ok, MD;  Location: [facility];  Service: General;  Laterality: Right;  . INGUINAL HERNIA REPAIR    . INSERTION OF MESH Right 02/26/2014   Procedure: INSERTION OF MESH;  Surgeon: Ralene Ok, MD;  Location: [facility];  Service: General;  Laterality: Right;  . LEFT HEART CATHETERIZATION WITH CORONARY ANGIOGRAM N/A 03/16/2011   Procedure: LEFT HEART CATHETERIZATION WITH CORONARY ANGIOGRAM;  Surgeon: Peter M Martinique, MD;  Location: [facility];  Service: Cardiovascular;  Laterality: N/A;  . PROSTATE BIOPSY     negative - [facility]  . TRANSURETHRAL RESECTION OF PROSTATE N/A 09/07/2016   Procedure: TRANSURETHRAL RESECTION OF THE PROSTATE (TURP);  Surgeon: Irine Seal, MD;   Location: [facility];  Service: Urology;  Laterality: N/A;   Allergies  Allergen Reactions  . Doxycycline Other (See Comments)    Upset stomach  . Keflex [Cephalexin] Other (See Comments)    Abdominal discomfort 04-19-17 Pt has tolerated orally   Prior to Admission medications   Medication Sig Start Date End Date Taking? Authorizing Provider  acetaminophen (TYLENOL) 500 MG tablet Take 1 tablet (500 mg total) by mouth every 8 (eight) hours. 12/26/18  Yes Debbe Odea, MD  bisacodyl (DULCOLAX) 5 MG EC tablet Take 1 tablet (5 mg total) by mouth daily as needed for moderate constipation. 12/26/18  Yes Debbe Odea, MD  carbidopa-levodopa (SINEMET) 25-100 MG tablet Take 1.5 tablets by mouth in the morning and at bedtime. 06/11/19  Yes Patel, Donika K, DO  lisinopril-hydrochlorothiazide (ZESTORETIC) 10-12.5 MG tablet TAKE ONE TABLET BY MOUTH DAILY 11/16/19  Yes Minus Breeding, MD  Misc Natural Products (PROSTATE) CAPS Take 1 tablet by  mouth daily. Prostate Plus otc supplement   Yes [provider]  Multiple Vitamin (MULTIVITAMIN WITH MINERALS) TABS tablet Take 1 tablet daily by mouth.   Yes [provider]  Multiple Vitamins-Minerals (PRESERVISION AREDS 2+MULTI VIT PO) Take 1 capsule by mouth 2 (two) times daily.   Yes [provider]  rivaroxaban (XARELTO) 20 MG TABS tablet TAKE ONE TABLET BY MOUTH DAILY WITH SUPPER 11/05/19  Yes Hochrein, Jeneen Rinks, MD  senna (SENOKOT) 8.6 MG TABS tablet Take 1 tablet by mouth daily as needed for mild constipation.   Yes [provider]  timolol (TIMOPTIC) 0.5 % ophthalmic solution PLACE ONE DROP IN [facility] EYE ONCE DAILY 09/11/19  Yes Rankin, Clent Demark, MD  vitamin C (ASCORBIC ACID) 500 MG tablet Take 500 mg by mouth daily.   Yes [provider]   Social History   Socioeconomic History  . Marital status: Married    Spouse name: Mardene Celeste  . Number of children: 0  . Years of education: 28  . Highest education level: Not on file   Occupational History  . Occupation: Scientist, product/process development  . Occupation: Civil engineer, contracting  Tobacco Use  . Smoking status: Former Smoker    Packs/day: 1.00    Years: 3.00    Pack years: 3.00    Types: Cigarettes    Quit date: 03/17/1955    Years since quitting: 65.1  . Smokeless tobacco: Former Network engineer  . Vaping Use: Never used  Substance and Sexual Activity  . Alcohol use: Yes    Alcohol/week: 14.0 standard drinks    Types: 7 Cans of beer, 7 Standard drinks or equivalent per week    Comment: daily  . Drug use: No  . Sexual activity: Not Currently    Birth control/protection: None  Other Topics Concern  . 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  Has no children.     Right handed    Social Determinants of Health   Financial Resource Strain: Not on file  Food Insecurity: Not on file  Transportation Needs: Not on file  Physical Activity: Not on file  Stress: Not on file  Social Connections: Not on file  Intimate Partner Violence: Not on file    Review of Systems No fever, rash, ear pains or d/c from ear.  No headache, no wounds.   Objective:   Vitals:   04/21/20 1337  BP: 140/69  Pulse: 66  Temp: 98 F (36.7 C)  TempSrc: Temporal  SpO2: 97%  Weight: 176 lb (79.8 kg)  Height: _0  (1.854 m)     Physical Exam Vitals reviewed.  Constitutional:      General: He is not in acute distress.    Appearance: Normal appearance. He is well-developed and well-nourished.  HENT:     Head: Normocephalic and atraumatic.     Right Ear: Tympanic membrane, ear canal and external ear normal.     Ears:     Comments: Moderate cerumen in canal but no lesions or canal edema/erythema noted.  Ear nontender.  No periauricular rash, or lymphadenopathy appreciated.  No nodules or bumps  appreciated on his posterior scalp, neck, or an area of concern.  No rash on scalp or wounds of scalp. Cardiovascular:  Rate and Rhythm: Normal rate.  Pulmonary:     Effort: Pulmonary effort is normal.  Skin:    Comments: Violaceous hue of feet bilaterally, cap refill 2 to 3 seconds toes,  thickened discolored nails.  Unable to palpate DP pulse bilaterally.  Spouse reports typical appearing symptoms in feet for some time, improves when he first wakes up in the morning.  Left heel/foot nontender, right heel with callus at the posterior aspect distal to the Achilles, no apparent surrounding erythema, edema, fluctuance or wounds.  Achilles nontender.  Remainder of foot nontender.    Neurological:     Mental Status: He is alert and oriented to person, place, and time.  Psychiatric:        Mood and Affect: Mood and affect and mood normal.        Behavior: Behavior normal.     34 minutes spent during visit, greater than 50% counseling and assimilation of information, chart review, and discussion of plan.    Assessment & Plan:  Chad Russell is a 87 y.o. male . PVD (peripheral vascular disease) (HCC) Pain of right heel - Plan: Ambulatory referral to Vascular Surgery Callus of heel  -Suspect progression of previous PVD.  We will have him evaluated by vascular specialist with RTC/ER precautions.  Posterior ankle pain on the right could be callus over pressure wound.  Float heels, avoid direct pressure to the area, has podiatry follow-up next week can recheck area at that time with RTC precautions.   I did not appreciate any lump/nodule or skin findings on area of concern at the posterior scalp/retroauricular area.  RTC precautions discussed if area returns.    No orders of the defined types were placed in this encounter.  Patient Instructions    I will refer you to vascular specialist. Try to keep pressure off of heels, with "floating heels", at night.  I do not see any bumps  behind the ear at this time, and calves appear to be equal.  If any return of calf pain be seen, and if new bumps or swelling seen on the scalp I am happy to follow-up and recheck those areas.  If any wounds, redness or discharge at the heel be seen right away.   If you have lab work done today you will be contacted with your lab results within the next 2 weeks.  If you have not heard from [place] then please contact us. The fastest way to get your results is to register for My Chart.   IF you received an x-ray today, you will receive an invoice from [facility]. Please contact [facility] at [phone] with questions or concerns regarding your invoice.   IF you received labwork today, you will receive an invoice from [facility]. Please contact [facility] at [phone] with questions or concerns regarding your invoice.   Our billing staff will not be able to assist you with questions regarding bills from these companies.  You will be contacted with the lab results as soon as they are available. The fastest way to get your results is to activate your My Chart account. Instructions are located on the last page of this paperwork. If you have not heard from [place] regarding the results in 2 weeks, please contact this office.         Signed, [name], MD [facility]

## 2020-04-22 ENCOUNTER — Encounter: Payer: Self-pay | Admitting: Family Medicine

## 2020-04-22 ENCOUNTER — Telehealth: Payer: Self-pay | Admitting: Family Medicine

## 2020-04-22 NOTE — Telephone Encounter (Signed)
Patient wife was informed of msg below. Wife also stated patient do not have an appt with podiatry and have not seen them in a long time. With that being said do we need to put in a new order for patient to be seen by Podiatry?

## 2020-04-22 NOTE — Telephone Encounter (Signed)
Attempted to call patient, reached voicemail.  Please try reaching him again.  After further thought, the area on the back of his heel could be an early pressure ulcer and may want to try moisturizer to help loosen up that callus.  I believe he has an appointment with podiatry in the next week or 2, can be examined further at that time but let me know if there are questions.

## 2020-04-22 NOTE — Telephone Encounter (Signed)
Lets recheck that ankle in next 2 weeks.  In office appt if possible. Thanks.

## 2020-04-23 NOTE — Telephone Encounter (Signed)
Please assist in getting the pt scheduled to come back into the office in the next couple of weeks.  Thanks for your time.

## 2020-04-23 NOTE — Telephone Encounter (Signed)
Left message on patient's voicemail to request call back for scheduling follow up in office within next 2 weeks.

## 2020-05-15 ENCOUNTER — Encounter (INDEPENDENT_AMBULATORY_CARE_PROVIDER_SITE_OTHER): Payer: Self-pay

## 2020-05-15 ENCOUNTER — Encounter (INDEPENDENT_AMBULATORY_CARE_PROVIDER_SITE_OTHER): Payer: Medicare HMO | Admitting: Ophthalmology

## 2020-05-31 ENCOUNTER — Other Ambulatory Visit: Payer: Self-pay

## 2020-05-31 DIAGNOSIS — I739 Peripheral vascular disease, unspecified: Secondary | ICD-10-CM

## 2020-06-11 ENCOUNTER — Encounter (INDEPENDENT_AMBULATORY_CARE_PROVIDER_SITE_OTHER): Payer: Self-pay | Admitting: Ophthalmology

## 2020-06-11 ENCOUNTER — Other Ambulatory Visit: Payer: Self-pay

## 2020-06-11 ENCOUNTER — Ambulatory Visit (INDEPENDENT_AMBULATORY_CARE_PROVIDER_SITE_OTHER): Payer: Medicare HMO | Admitting: Ophthalmology

## 2020-06-11 DIAGNOSIS — H34811 Central retinal vein occlusion, right eye, with macular edema: Secondary | ICD-10-CM | POA: Diagnosis not present

## 2020-06-11 DIAGNOSIS — H353221 Exudative age-related macular degeneration, left eye, with active choroidal neovascularization: Secondary | ICD-10-CM | POA: Diagnosis not present

## 2020-06-11 MED ORDER — AFLIBERCEPT 2MG/0.05ML IZ SOLN FOR KALEIDOSCOPE
2.0000 mg | INTRAVITREAL | Status: AC | PRN
  Administered 2020-06-11: 2 mg via INTRAVITREAL

## 2020-06-11 NOTE — Assessment & Plan Note (Signed)
Chronic CME due to ischemia, controlled peripheral retinal nonperfusion, not much in the way of dot blot hemorrhages peripherally, will deliver Eylea today for control of Neovet for neovascular disease and prevention thereof and follow-up again in 3 months to monitor

## 2020-06-11 NOTE — Progress Notes (Signed)
06/11/2020     CHIEF COMPLAINT Patient presents for Retina Follow Up (10 Wk F/U OD, poss Eylea OD//Pt sts VA has improved OS. Pt denies new symptoms OU. VA stable OD.)   HISTORY OF PRESENT ILLNESS: Chad Russell is a 87 y.o. male who presents to the clinic today for:   HPI    Retina Follow Up    Patient presents with  CRVO/BRVO.  In right eye.  This started 10 weeks ago.  Severity is moderate.  Duration of 10 weeks.  Since onset it is stable. Additional comments: 10 Wk F/U OD, poss Eylea OD  Pt sts VA has improved OS. Pt denies new symptoms OU. VA stable OD.       Last edited by Rockie Neighbours, [place] on 06/11/2020  1:14 PM. (History)      Referring physician: [name], MD [address] [place],   [id number]  HISTORICAL INFORMATION:   Selected notes from the MEDICAL RECORD NUMBER       CURRENT MEDICATIONS: Current Outpatient Medications (Ophthalmic Drugs)  Medication Sig  . timolol (TIMOPTIC) 0.5 % ophthalmic solution PLACE ONE DROP IN [facility] EYE ONCE DAILY   No current facility-administered medications for this visit. (Ophthalmic Drugs)   Current Outpatient Medications (Other)  Medication Sig  . acetaminophen (TYLENOL) 500 MG tablet Take 1 tablet (500 mg total) by mouth every 8 (eight) hours.  . bisacodyl (DULCOLAX) 5 MG EC tablet Take 1 tablet (5 mg total) by mouth daily as needed for moderate constipation.  . carbidopa-levodopa (SINEMET) 25-100 MG tablet Take 1.5 tablets by mouth in the morning and at bedtime.  Marland Kitchen lisinopril-hydrochlorothiazide (ZESTORETIC) 10-12.5 MG tablet TAKE ONE TABLET BY MOUTH DAILY  . Misc Natural Products (PROSTATE) CAPS Take 1 tablet by mouth daily. Prostate Plus otc supplement  . Multiple Vitamin (MULTIVITAMIN WITH MINERALS) TABS tablet Take 1 tablet daily by mouth.  . Multiple Vitamins-Minerals (PRESERVISION AREDS 2+MULTI VIT PO) Take 1 capsule by mouth 2 (two) times daily.  . rivaroxaban (XARELTO) 20 MG TABS tablet TAKE  ONE TABLET BY MOUTH DAILY WITH SUPPER  . senna (SENOKOT) 8.6 MG TABS tablet Take 1 tablet by mouth daily as needed for mild constipation.  . vitamin C (ASCORBIC ACID) 500 MG tablet Take 500 mg by mouth daily.   No current facility-administered medications for this visit. (Other)      REVIEW OF SYSTEMS:    ALLERGIES Allergies  Allergen Reactions  . Doxycycline Other (See Comments)    Upset stomach  . Keflex [Cephalexin] Other (See Comments)    Abdominal discomfort 04-19-17 Pt has tolerated orally    PAST MEDICAL HISTORY Past Medical History:  Diagnosis Date  . Arthritis   . Atrial fibrillation ([place])   . Balance problem 10/31/2013  . BPH (benign prostatic hyperplasia) 2014   Urology Dr [name] with [facility] [phone]   . Bradycardia    a. baseline HR in the 30's. Asymptomatic - no history of PPM placement.   Marland Kitchen CAD (coronary artery disease)    LHC 03/16/11: Mid LAD 10-20%, proximal circumflex 10%, mid RCA 20%, EF 55%.  . Cataract   . Complete heart block ([place])   . Fracture of femoral neck, right ([place]) 04/19/2017  . GERD (gastroesophageal reflux disease)    takes occasional  zantac  . Hx of echocardiogram    Echo 02/2011: EF 65-70%, normal wall motion, MAC, mild MR, mild LAE, mild RVE  . Hypertension   . OSA (  obstructive sleep apnea)    per patient went to have sleep study done about 2 years ago, bu they never F/U with him about results; but wife reports he still has periods of apnea at night   . Talar fracture    casted no surg   Past Surgical History:  Procedure Laterality Date  . ABDOMINAL AORTOGRAM W/LOWER EXTREMITY N/A 01/17/2017   Procedure: ABDOMINAL AORTOGRAM W/LOWER EXTREMITY;  Surgeon: Angelia Mould, MD;  Location: [facility];  Service: Cardiovascular;  Laterality: N/A;  . COLONOSCOPY     Repeated and normal in 2011  . FRACTURE SURGERY    . HEMIARTHROPLASTY HIP Right 04/19/2017  . HEMORROIDECTOMY    . HIP ARTHROPLASTY Right 04/19/2017    Procedure: ARTHROPLASTY BIPOLAR HIP (HEMIARTHROPLASTY);  Surgeon: Marchia Bond, MD;  Location: [facility];  Service: Orthopedics;  Laterality: Right;  . HIP ARTHROPLASTY Left 12/16/2018   Procedure: ARTHROPLASTY BIPOLAR HIP (HEMIARTHROPLASTY);  Surgeon: Altamese Simpson, MD;  Location: [facility];  Service: Orthopedics;  Laterality: Left;  . INGUINAL HERNIA REPAIR Right 02/26/2014   Procedure: LAPAROSCOPIC RIGHT INGUINAL HERNIA REPAIR;  Surgeon: Ralene Ok, MD;  Location: [facility];  Service: General;  Laterality: Right;  . INGUINAL HERNIA REPAIR    . INSERTION OF MESH Right 02/26/2014   Procedure: INSERTION OF MESH;  Surgeon: Ralene Ok, MD;  Location: [facility];  Service: General;  Laterality: Right;  . LEFT HEART CATHETERIZATION WITH CORONARY ANGIOGRAM N/A 03/16/2011   Procedure: LEFT HEART CATHETERIZATION WITH CORONARY ANGIOGRAM;  Surgeon: Peter M Martinique, MD;  Location: [facility];  Service: Cardiovascular;  Laterality: N/A;  . PROSTATE BIOPSY     negative - [facility]  . TRANSURETHRAL RESECTION OF PROSTATE N/A 09/07/2016   Procedure: TRANSURETHRAL RESECTION OF THE PROSTATE (TURP);  Surgeon: Irine Seal, MD;  Location: [facility];  Service: Urology;  Laterality: N/A;    FAMILY HISTORY Family History  Problem Relation Age of Onset  . Hypertension Mother   . Multiple sclerosis Sister     SOCIAL HISTORY Social History   Tobacco Use  . Smoking status: Former Smoker    Packs/day: 1.00    Years: 3.00    Pack years: 3.00    Types: Cigarettes    Quit date: 03/17/1955    Years since quitting: 65.2  . Smokeless tobacco: Former Network engineer  . Vaping Use: Never used  Substance Use Topics  . Alcohol use: Yes    Alcohol/week: 14.0 standard drinks    Types: 7 Cans of beer, 7 Standard drinks or equivalent per week    Comment: daily  . Drug use: No         OPHTHALMIC EXAM: Base Eye Exam    Visual Acuity (ETDRS)      Right Left   Dist cc CF @ 1' 20/60ecc -3   Dist ph cc NI NI    Correction: Glasses       Tonometry (Tonopen, 1:19 PM)      Right Left   Pressure 14 18       Pupils      Pupils Dark Light Shape React APD   Right PERRL 4 3 Round Slow None   Left PERRL 4 3 Round Slow None       Visual Fields (Counting fingers)      Left Right   Restrictions Total superior temporal, inferior temporal, superior nasal, inferior nasal deficiencies Partial outer inferior nasal deficiency       Extraocular Movement  Right Left    Full Full       Neuro/Psych    Oriented x3: Yes   Mood/Affect: Normal       Dilation    Right eye: 1.0% Mydriacyl, 2.5% Phenylephrine @ 1:19 PM        Slit Lamp and Fundus Exam    External Exam      Right Left   External Normal Normal       Slit Lamp Exam      Right Left   Lids/Lashes Normal Normal   Conjunctiva/Sclera White and quiet White and quiet   Cornea Clear Clear   Anterior Chamber Deep and quiet Deep and quiet   Iris Round and reactive Round and reactive   Lens Posterior chamber intraocular lens Posterior chamber intraocular lens   Anterior Vitreous Normal Normal       Fundus Exam      Right Left   Posterior Vitreous Posterior vitreous detachment    Disc Normal    C/D Ratio 0.5    Macula Macular thickening, Cystoid macular edema, Microaneurysms, Atrophy, Retinal pigment epithelial atrophy, Drusen, Soft drusen, Intraretinal hemorrhage    Vessels Old central retinal vein occlusion intraretinal hemorrhages    Periphery Normal, good sector PRP temporally           IMAGING AND PROCEDURES  Imaging and Procedures for 06/11/20  OCT, Retina - OU - Both Eyes       Right Eye Quality was good. Scan locations included subfoveal. Central Foveal Thickness: 974. Progression has worsened. Findings include cystoid macular edema, abnormal foveal contour.   Left Eye Quality was good. Scan locations included subfoveal. Central Foveal Thickness: 266. Progression has improved. Findings include abnormal foveal  contour, inner retinal atrophy, outer retinal atrophy, central retinal atrophy, retinal drusen , subretinal hyper-reflective material, subretinal scarring, no SRF.   Notes Massive CME secondary to CRV O, sensitive to Eylea in the past, OD, less then compared to 10 weeks previous.  Still chronic still active poor visual acuity prognosis.  Currently at 7-week follow-up  OS, much less active CNVM, chronic subfoveal scarring limits the acuity OS yet no progression of intraretinal or subretinal fluid.,         Intravitreal Injection, Pharmacologic Agent - OD - Right Eye       Time Out 06/11/2020. 1:56 PM. Confirmed correct patient, procedure, site, and patient consented.   Anesthesia Topical anesthesia was used. Anesthetic medications included [place] 3.5%.   Procedure Preparation included Ofloxacin , 5% betadine to ocular surface, 10% betadine to eyelids. A 30 gauge needle was used.   Injection:  2 mg aflibercept Alfonse Flavors) SOLN   NDC: [id number], Lot: [id number]   Route: Intravitreal, Site: Right Eye, Waste: 0 mg  Post-op Post injection exam found visual acuity of at least counting fingers. The patient tolerated the procedure well. There were no complications. The patient received written and verbal post procedure care education. Post injection medications were not given.                 ASSESSMENT/PLAN:  Central retinal vein occlusion with macular edema of right eye Chronic CME due to ischemia, controlled peripheral retinal nonperfusion, not much in the way of dot blot hemorrhages peripherally, will deliver Eylea today for control of [place] for neovascular disease and prevention thereof and follow-up again in 3 months to monitor  Exudative age-related macular degeneration of left eye with active choroidal neovascularization ([place]) Left eye with improved and visual acuity on current treatment  protocol follow-up as scheduled      ICD-10-CM   1. Central retinal vein occlusion  with macular edema of right eye  H34.8110 OCT, Retina - OU - Both Eyes    Intravitreal Injection, Pharmacologic Agent - OD - Right Eye    aflibercept (EYLEA) SOLN 2 mg  2. Exudative age-related macular degeneration of left eye with active choroidal neovascularization ([place])  H35.3221     1.  OD, controlled neovascular response of CRV O yet CME continues and is significant and not highly responsive to ongoing antivegF.  We will repeat injection Eylea today and follow-up next visit right eye in 3 months  2.  OS improving acuity and controlled CNVM, follow-up as scheduled  3.  OS, will schedule follow-up soon for possible injection to control CNVM  Ophthalmic Meds Ordered this visit:  Meds ordered this encounter  Medications  . aflibercept (EYLEA) SOLN 2 mg       Return in about 3 months (around 09/10/2020) for dilate, OD, no injection OD.  There are no Patient Instructions on file for this visit.   Explained the diagnoses, plan, and follow up with the patient and they expressed understanding.  Patient expressed understanding of the importance of proper follow up care.   Clent Demark Neziah Vogelgesang M.D. Diseases & Surgery of the Retina and Vitreous Retina & Diabetic [place] 06/11/20     Abbreviations: M myopia (nearsighted); A astigmatism; H hyperopia (farsighted); P presbyopia; Mrx spectacle prescription;  CTL contact lenses; OD right eye; OS left eye; OU both eyes  XT exotropia; ET esotropia; PEK punctate epithelial keratitis; PEE punctate epithelial erosions; DES dry eye syndrome; MGD meibomian gland dysfunction; ATs artificial tears; PFAT's preservative free artificial tears; [place] nuclear sclerotic cataract; PSC posterior subcapsular cataract; ERM epi-retinal membrane; PVD posterior vitreous detachment; RD retinal detachment; DM diabetes mellitus; DR diabetic retinopathy; NPDR non-proliferative diabetic retinopathy; PDR proliferative diabetic retinopathy; CSME clinically significant macular  edema; DME diabetic macular edema; dbh dot blot hemorrhages; CWS cotton wool spot; POAG primary open angle glaucoma; C/D cup-to-disc ratio; HVF humphrey visual field; GVF goldmann visual field; OCT optical coherence tomography; IOP intraocular pressure; BRVO Branch retinal vein occlusion; CRVO central retinal vein occlusion; CRAO central retinal artery occlusion; BRAO branch retinal artery occlusion; RT retinal tear; SB scleral buckle; PPV pars plana vitrectomy; VH Vitreous hemorrhage; PRP panretinal laser photocoagulation; IVK intravitreal kenalog; VMT vitreomacular traction; MH Macular hole;  NVD neovascularization of the disc; NVE neovascularization elsewhere; AREDS age related eye disease study; ARMD age related macular degeneration; POAG primary open angle glaucoma; EBMD epithelial/anterior basement membrane dystrophy; ACIOL anterior chamber intraocular lens; IOL intraocular lens; PCIOL posterior chamber intraocular lens; Phaco/IOL phacoemulsification with intraocular lens placement; [place] photorefractive keratectomy; LASIK laser assisted in situ keratomileusis; HTN hypertension; DM diabetes mellitus; COPD chronic obstructive pulmonary disease

## 2020-06-11 NOTE — Assessment & Plan Note (Signed)
Left eye with improved and visual acuity on current treatment protocol follow-up as scheduled

## 2020-06-18 ENCOUNTER — Encounter: Payer: Self-pay | Admitting: Vascular Surgery

## 2020-06-18 ENCOUNTER — Ambulatory Visit (HOSPITAL_COMMUNITY)
Admission: RE | Admit: 2020-06-18 | Discharge: 2020-06-18 | Disposition: A | Discharge status: Home / Self Care | Payer: Medicare HMO | Source: Ambulatory Visit | Attending: Vascular Surgery | Admitting: Vascular Surgery

## 2020-06-18 ENCOUNTER — Other Ambulatory Visit: Payer: Self-pay

## 2020-06-18 ENCOUNTER — Ambulatory Visit: Payer: Medicare HMO | Admitting: Vascular Surgery

## 2020-06-18 VITALS — BP 156/82 | HR 35 | Temp 97.9°F | Resp 18 | Ht 72.0 in | Wt 175.0 lb

## 2020-06-18 DIAGNOSIS — I739 Peripheral vascular disease, unspecified: Secondary | ICD-10-CM

## 2020-06-18 NOTE — Progress Notes (Signed)
REASON FOR CONSULT:    To evaluate for peripheral vascular disease and right heel pain.  The consult is requested by Dr. [name].   ASSESSMENT & PLAN:   PERIPHERAL VASCULAR DISEASE: This patient has evidence of tibial artery occlusive disease bilaterally.  He has dependent rubor but this by itself is not concerning.  He does have a small crack on the lateral aspect of his right foot as documented in the photographs below.  Currently there is no signs of infection.  I have instructed his wife to keep this covered with bacitracin and I will plan on seeing him back in 3 months.  If the wound progresses we would have to consider arteriography.  However he does have a biphasic posterior tibial signal bilaterally and a reasonable toe pressure on the right which would suggest adequate circulation for healing.  Given his age I would only consider arteriography if the wound was worsening.  Fortunately he is not a smoker.  He will call sooner if the wound progresses or changes.   Deitra Mayo, MD Office: [phone]   HPI:   Chad Russell is a pleasant 87 y.o. male, who is referred with bluish discoloration of both feet.  The patient's activity is very limited.  He is in a wheelchair and is essentially nonambulatory.  He has had dependent rubor for some time.  His wife noted that he developed a small crack on the lateral aspect of his right foot about a month ago and she has been putting a Band-Aid over this.  He denies any history of rest pain or history of nonhealing wounds prior to this.  His risk factors for peripheral vascular disease include hypertension.  He denies any history of diabetes, hypercholesterolemia, family history of premature cardiovascular disease, or tobacco use.  He is on Xarelto for atrial fibrillation.  Past Medical History:  Diagnosis Date  . Arthritis   . Atrial fibrillation ([place])   . Balance problem 10/31/2013  . BPH (benign prostatic hyperplasia) 2014    Urology Dr [name] with [facility] [phone]   . Bradycardia    a. baseline HR in the 30's. Asymptomatic - no history of PPM placement.   Marland Kitchen CAD (coronary artery disease)    LHC 03/16/11: Mid LAD 10-20%, proximal circumflex 10%, mid RCA 20%, EF 55%.  . Cataract   . Complete heart block ([place])   . Fracture of femoral neck, right ([place]) 04/19/2017  . GERD (gastroesophageal reflux disease)    takes occasional  zantac  . Hx of echocardiogram    Echo 02/2011: EF 65-70%, normal wall motion, MAC, mild MR, mild LAE, mild RVE  . Hypertension   . OSA (obstructive sleep apnea)    per patient went to have sleep study done about 2 years ago, bu they never F/U with him about results; but wife reports he still has periods of apnea at night   . Talar fracture    casted no surg    Family History  Problem Relation Age of Onset  . Hypertension Mother   . Multiple sclerosis Sister     SOCIAL HISTORY: Social History   Socioeconomic History  . Marital status: Married    Spouse name: Mardene Celeste  . Number of children: 0  . Years of education: 65  . Highest education level: Not on file  Occupational History  . Occupation: Scientist, product/process development  . Occupation: Civil engineer, contracting  Tobacco Use  . Smoking status: Former Smoker    Packs/day: 1.00  Years: 3.00    Pack years: 3.00    Types: Cigarettes    Quit date: 03/17/1955    Years since quitting: 65.3  . Smokeless tobacco: Former Network engineer  . Vaping Use: Never used  Substance and Sexual Activity  . Alcohol use: Yes    Alcohol/week: 14.0 standard drinks    Types: 7 Cans of beer, 7 Standard drinks or equivalent per week    Comment: daily  . Drug use: No  . Sexual activity: Not Currently    Birth control/protection: None  Other Topics Concern  . 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  Has no children.     Right handed    Social Determinants of Health   Financial Resource Strain: Not on file  Food Insecurity: Not on file  Transportation Needs: Not on file  Physical Activity: Not on file  Stress: Not on file  Social Connections: Not on file  Intimate Partner Violence: Not on file    Allergies  Allergen Reactions  . Doxycycline Other (See Comments)    Upset stomach  . Keflex [Cephalexin] Other (See Comments)    Abdominal discomfort 04-19-17 Pt has tolerated orally    Current Outpatient Medications  Medication Sig Dispense Refill  . acetaminophen (TYLENOL) 500 MG tablet Take 1 tablet (500 mg total) by mouth every 8 (eight) hours. 30 tablet 0  . bisacodyl (DULCOLAX) 5 MG EC tablet Take 1 tablet (5 mg total) by mouth daily as needed for moderate constipation. 30 tablet 0  . carbidopa-levodopa (SINEMET) 25-100 MG tablet Take 1.5 tablets by mouth in the morning and at bedtime. 270 tablet 3  . lisinopril-hydrochlorothiazide (ZESTORETIC) 10-12.5 MG tablet TAKE ONE TABLET BY MOUTH DAILY 90 tablet 2  . Misc Natural Products (PROSTATE) CAPS Take 1 tablet by mouth daily. Prostate Plus otc supplement    . Multiple Vitamin (MULTIVITAMIN WITH MINERALS) TABS tablet Take 1 tablet daily by mouth.    . Multiple Vitamins-Minerals (PRESERVISION AREDS 2+MULTI VIT PO) Take 1 capsule by mouth 2 (two) times daily.    . rivaroxaban (XARELTO) 20 MG TABS tablet TAKE ONE TABLET BY MOUTH DAILY WITH SUPPER 90 tablet 3  . senna (SENOKOT) 8.6 MG TABS tablet Take 1 tablet by mouth daily as needed for mild constipation.    . timolol (TIMOPTIC) 0.5 % ophthalmic solution PLACE ONE DROP IN [facility] EYE ONCE DAILY 10 mL 5  . vitamin C (ASCORBIC ACID) 500 MG tablet Take 500 mg by mouth daily.     No current facility-administered medications for this visit.    REVIEW OF SYSTEMS:  _0  denotes positive finding, _1  denotes negative finding Cardiac  Comments:  Chest pain or chest  pressure:    Shortness of breath upon exertion:    Short of breath when lying flat:    Irregular heart rhythm:        Vascular    Pain in calf, thigh, or hip brought on by ambulation:    Pain in feet at night that wakes you up from your sleep:     Blood clot in your veins:    Leg swelling:         Pulmonary    Oxygen at home:    Productive  cough:     Wheezing:         Neurologic    Sudden weakness in arms or legs:     Sudden numbness in arms or legs:     Sudden onset of difficulty speaking or slurred speech:    Temporary loss of vision in one eye:     Problems with dizziness:         Gastrointestinal    Blood in stool:     Vomited blood:         Genitourinary    Burning when urinating:     Blood in urine:        Psychiatric    Major depression:         Hematologic    Bleeding problems:    Problems with blood clotting too easily:        Skin    Rashes or ulcers: x       Constitutional    Fever or chills:     PHYSICAL EXAM:   Vitals:   06/18/20 1345  BP: (!) 156/82  Pulse: (!) 35  Resp: 18  Temp: 97.9 F (36.6 C)  TempSrc: Temporal  SpO2: 100%  Weight: 175 lb (79.4 kg)  Height: 6' (1.829 m)    GENERAL: The patient is a well-nourished male, in no acute distress. The vital signs are documented above. CARDIAC: There is a regular rate and rhythm.  VASCULAR: I do not detect carotid bruits. He has palpable femoral and popliteal pulses bilaterally. I cannot palpate pedal pulses. He has dependent rubor bilaterally. PULMONARY: There is good air exchange bilaterally without wheezing or rales. ABDOMEN: Soft and non-tender with normal pitched bowel sounds.  MUSCULOSKELETAL: There are no major deformities or cyanosis. NEUROLOGIC: No focal weakness or paresthesias are detected. SKIN: There are no wounds or pressure sores in the right heel where he complains of some pain. There is a small crack on the lateral aspect of his right foot as documented in the  photographs below.      PSYCHIATRIC: The patient has a normal affect.  DATA:    ARTERIAL DOPPLER STUDY: I have independently interpreted his arterial Doppler study today.  On the right side, where he is having right foot pain, there is a biphasic posterior tibial signal with the Doppler and a monophasic dorsalis pedis signal.  ABIs greater than 100% and is likely falsely elevated secondary to calcific disease.  Toe pressure is 89 mmHg.  On the left side there is a biphasic posterior tibial signal and a brisk monophasic dorsalis pedis signal.  ABIs greater than 100%. Toe pressure is 29 mmHg

## 2020-06-25 DIAGNOSIS — S72002D Fracture of unspecified part of neck of left femur, subsequent encounter for closed fracture with routine healing: Secondary | ICD-10-CM | POA: Diagnosis not present

## 2020-07-11 DIAGNOSIS — R2689 Other abnormalities of gait and mobility: Secondary | ICD-10-CM | POA: Diagnosis not present

## 2020-07-11 DIAGNOSIS — M25552 Pain in left hip: Secondary | ICD-10-CM | POA: Diagnosis not present

## 2020-07-16 ENCOUNTER — Ambulatory Visit (INDEPENDENT_AMBULATORY_CARE_PROVIDER_SITE_OTHER): Payer: Medicare HMO | Admitting: Ophthalmology

## 2020-07-16 ENCOUNTER — Other Ambulatory Visit: Payer: Self-pay

## 2020-07-16 ENCOUNTER — Encounter (INDEPENDENT_AMBULATORY_CARE_PROVIDER_SITE_OTHER): Payer: Self-pay | Admitting: Ophthalmology

## 2020-07-16 DIAGNOSIS — H353221 Exudative age-related macular degeneration, left eye, with active choroidal neovascularization: Secondary | ICD-10-CM | POA: Diagnosis not present

## 2020-07-16 DIAGNOSIS — H348111 Central retinal vein occlusion, right eye, with retinal neovascularization: Secondary | ICD-10-CM

## 2020-07-16 MED ORDER — AFLIBERCEPT 2MG/0.05ML IZ SOLN FOR KALEIDOSCOPE
2.0000 mg | INTRAVITREAL | Status: AC | PRN
  Administered 2020-07-16: 2 mg via INTRAVITREAL

## 2020-07-16 NOTE — Progress Notes (Signed)
823    07/16/2020     CHIEF COMPLAINT Patient presents for Retina Follow Up (7 week fu OS / Eylea OS/Pt states VA OU stable since last visit. Pt denies FOL, floaters, or ocular pain OU. /Pt reports using Timolol QDAY OU/)   HISTORY OF PRESENT ILLNESS: Chad Russell is a 87 y.o. male who presents to the clinic today for:   HPI    Retina Follow Up    Diagnosis: Wet AMD   Laterality: left eye   Onset: 7 weeks ago   Severity: mild   Duration: 7 weeks   Course: stable   Comments: 7 week fu OS / Eylea OS Pt states VA OU stable since last visit. Pt denies FOL, floaters, or ocular pain OU.  Pt reports using Timolol QDAY OU        Last edited by Kendra Opitz, COA on 07/16/2020 11:19 AM. (History)      Referring physician: [name], MD [address] [place],  [place] [address]  HISTORICAL INFORMATION:   Selected notes from the MEDICAL RECORD NUMBER       CURRENT MEDICATIONS: Current Outpatient Medications (Ophthalmic Drugs)  Medication Sig  . timolol (TIMOPTIC) 0.5 % ophthalmic solution PLACE ONE DROP IN [facility] EYE ONCE DAILY   No current facility-administered medications for this visit. (Ophthalmic Drugs)   Current Outpatient Medications (Other)  Medication Sig  . acetaminophen (TYLENOL) 500 MG tablet Take 1 tablet (500 mg total) by mouth every 8 (eight) hours.  . bisacodyl (DULCOLAX) 5 MG EC tablet Take 1 tablet (5 mg total) by mouth daily as needed for moderate constipation.  . carbidopa-levodopa (SINEMET) 25-100 MG tablet Take 1.5 tablets by mouth in the morning and at bedtime.  Marland Kitchen lisinopril-hydrochlorothiazide (ZESTORETIC) 10-12.5 MG tablet TAKE ONE TABLET BY MOUTH DAILY  . Misc Natural Products (PROSTATE) CAPS Take 1 tablet by mouth daily. Prostate Plus otc supplement  . Multiple Vitamin (MULTIVITAMIN WITH MINERALS) TABS tablet Take 1 tablet daily by mouth.  . Multiple Vitamins-Minerals (PRESERVISION AREDS 2+MULTI VIT PO) Take 1 capsule by mouth 2 (two)  times daily.  . rivaroxaban (XARELTO) 20 MG TABS tablet TAKE ONE TABLET BY MOUTH DAILY WITH SUPPER  . senna (SENOKOT) 8.6 MG TABS tablet Take 1 tablet by mouth daily as needed for mild constipation.  . vitamin C (ASCORBIC ACID) 500 MG tablet Take 500 mg by mouth daily.   No current facility-administered medications for this visit. (Other)      REVIEW OF SYSTEMS:    ALLERGIES Allergies  Allergen Reactions  . Doxycycline Other (See Comments)    Upset stomach  . Keflex [Cephalexin] Other (See Comments)    Abdominal discomfort 04-19-17 Pt has tolerated orally    PAST MEDICAL HISTORY Past Medical History:  Diagnosis Date  . Arthritis   . Atrial fibrillation ([place])   . Balance problem 10/31/2013  . BPH (benign prostatic hyperplasia) 2014   Urology Dr [name] with [facility] [phone]   . Bradycardia    a. baseline HR in the 30's. Asymptomatic - no history of PPM placement.   Marland Kitchen CAD (coronary artery disease)    LHC 03/16/11: Mid LAD 10-20%, proximal circumflex 10%, mid RCA 20%, EF 55%.  . Cataract   . Complete heart block ([place])   . Fracture of femoral neck, right ([place]) 04/19/2017  . GERD (gastroesophageal reflux disease)    takes occasional  zantac  . Hx of echocardiogram    Echo 02/2011: EF 65-70%,  normal wall motion, MAC, mild MR, mild LAE, mild RVE  . Hypertension   . OSA (obstructive sleep apnea)    per patient went to have sleep study done about 2 years ago, bu they never F/U with him about results; but wife reports he still has periods of apnea at night   . Talar fracture    casted no surg   Past Surgical History:  Procedure Laterality Date  . ABDOMINAL AORTOGRAM W/LOWER EXTREMITY N/A 01/17/2017   Procedure: ABDOMINAL AORTOGRAM W/LOWER EXTREMITY;  Surgeon: Angelia Mould, MD;  Location: [facility];  Service: Cardiovascular;  Laterality: N/A;  . COLONOSCOPY     Repeated and normal in 2011  . FRACTURE SURGERY    . HEMIARTHROPLASTY HIP Right 04/19/2017  .  HEMORROIDECTOMY    . HIP ARTHROPLASTY Right 04/19/2017   Procedure: ARTHROPLASTY BIPOLAR HIP (HEMIARTHROPLASTY);  Surgeon: Marchia Bond, MD;  Location: [facility];  Service: Orthopedics;  Laterality: Right;  . HIP ARTHROPLASTY Left 12/16/2018   Procedure: ARTHROPLASTY BIPOLAR HIP (HEMIARTHROPLASTY);  Surgeon: Altamese , MD;  Location: [facility];  Service: Orthopedics;  Laterality: Left;  . INGUINAL HERNIA REPAIR Right 02/26/2014   Procedure: LAPAROSCOPIC RIGHT INGUINAL HERNIA REPAIR;  Surgeon: Ralene Ok, MD;  Location: [facility];  Service: General;  Laterality: Right;  . INGUINAL HERNIA REPAIR    . INSERTION OF MESH Right 02/26/2014   Procedure: INSERTION OF MESH;  Surgeon: Ralene Ok, MD;  Location: [facility];  Service: General;  Laterality: Right;  . LEFT HEART CATHETERIZATION WITH CORONARY ANGIOGRAM N/A 03/16/2011   Procedure: LEFT HEART CATHETERIZATION WITH CORONARY ANGIOGRAM;  Surgeon: Peter M Martinique, MD;  Location: [facility];  Service: Cardiovascular;  Laterality: N/A;  . PROSTATE BIOPSY     negative - [facility]  . TRANSURETHRAL RESECTION OF PROSTATE N/A 09/07/2016   Procedure: TRANSURETHRAL RESECTION OF THE PROSTATE (TURP);  Surgeon: Irine Seal, MD;  Location: [facility];  Service: Urology;  Laterality: N/A;    FAMILY HISTORY Family History  Problem Relation Age of Onset  . Hypertension Mother   . Multiple sclerosis Sister     SOCIAL HISTORY Social History   Tobacco Use  . Smoking status: Former Smoker    Packs/day: 1.00    Years: 3.00    Pack years: 3.00    Types: Cigarettes    Quit date: 03/17/1955    Years since quitting: 65.3  . Smokeless tobacco: Former Network engineer  . Vaping Use: Never used  Substance Use Topics  . Alcohol use: Yes    Alcohol/week: 14.0 standard drinks    Types: 7 Cans of beer, 7 Standard drinks or equivalent per week    Comment: daily  . Drug use: No         OPHTHALMIC EXAM: Base Eye Exam    Visual Acuity (ETDRS)      Right Left    Dist cc CF at face 20/80 -2   Dist ph cc  20/70ecc   Correction: Glasses       Tonometry (Tonopen, 11:22 AM)      Right Left   Pressure 13 18       Pupils      Pupils Dark Light Shape React APD   Right PERRL 4 3 Round Slow None   Left PERRL 4 3 Round Slow None       Visual Fields      Left Right   Restrictions Total superior temporal, inferior temporal, superior nasal, inferior  nasal deficiencies Partial outer inferior nasal deficiency       Neuro/Psych    Oriented x3: Yes   Mood/Affect: Normal       Dilation    Left eye: 1.0% Mydriacyl, 2.5% Phenylephrine @ 11:22 AM        Slit Lamp and Fundus Exam    External Exam      Right Left   External Normal Normal       Slit Lamp Exam      Right Left   Lids/Lashes Normal Normal   Conjunctiva/Sclera White and quiet White and quiet   Cornea Clear Clear   Anterior Chamber Deep and quiet Deep and quiet   Iris Round and reactive Round and reactive   Lens Posterior chamber intraocular lens Posterior chamber intraocular lens   Anterior Vitreous Normal Normal       Fundus Exam      Right Left   Posterior Vitreous  Posterior vitreous detachment   Disc  Pallor 2+   C/D Ratio  0.75   Macula  Geographic atrophy, Soft drusen,no hemorrhage   Vessels  Normal   Periphery  Normal          IMAGING AND PROCEDURES  Imaging and Procedures for 07/16/20  OCT, Retina - OU - Both Eyes       Right Eye Quality was good. Scan locations included subfoveal. Central Foveal Thickness: 609. Progression has worsened. Findings include cystoid macular edema, abnormal foveal contour.   Left Eye Quality was good. Scan locations included subfoveal. Central Foveal Thickness: 251. Progression has improved. Findings include abnormal foveal contour, inner retinal atrophy, outer retinal atrophy, central retinal atrophy, retinal drusen , subretinal hyper-reflective material, subretinal scarring, no SRF.   Notes Massive CME secondary to CRVO,  OD, sensitive to Eylea in the past, and now at 5 weeks postinjection vastly improved.  Follow-up OD next as scheduled   OS, much less active CNVM, chronic subfoveal scarring limits the acuity OS yet no progression of intraretinal or subretinal fluid.,  Currently at 77-monthfollow-up        Intravitreal Injection, Pharmacologic Agent - OS - Left Eye       Time Out 07/16/2020. 12:07 PM. Confirmed correct patient, procedure, site, and patient consented.   Anesthesia Topical anesthesia was used. Anesthetic medications included Akten 3.5%.   Procedure Preparation included 10% betadine to eyelids, 5% betadine to ocular surface, Ofloxacin . A 30 gauge needle was used.   Injection:  2 mg aflibercept (Alfonse Flavors SOLN   NDC: [id number] Lot: [id number]  Route: Intravitreal, Site: Left Eye, Waste: 0 mg  Post-op Post injection exam found visual acuity of at least counting fingers. The patient tolerated the procedure well. There were no complications. The patient received written and verbal post procedure care education. Post injection medications were not given.                 ASSESSMENT/PLAN:  Exudative age-related macular degeneration of left eye with active choroidal neovascularization (HCC) Now at 373-monthtatus post injection left eye for disease activity most recently visible on detectable on OCT evaluation in fall 2021  Central retinal vein occlusion with neovascularization of right eye Improved CME from ischemic CRV O only 5 weeks post recent Eylea, follow-up as scheduled      ICD-10-CM   1. Exudative age-related macular degeneration of left eye with active choroidal neovascularization (HCC)  H35.3221 OCT, Retina - OU - Both Eyes    Intravitreal Injection, Pharmacologic Agent -  OS - Left Eye    aflibercept (EYLEA) SOLN 2 mg  2. Central retinal vein occlusion with neovascularization of right eye  H34.8111     1.  OS most recent disease CNVM activity was fall 2021.   Patient now has improved acuity symptomatically and also on examination with less disease activity on OCT at 63-monthfollow-up today,  repeat intravitreal Eylea OS today  2.  Follow-up OD as scheduled  3.  Ophthalmic Meds Ordered this visit:  Meds ordered this encounter  Medications  . aflibercept (EYLEA) SOLN 2 mg       Return in about 4 months (around 11/16/2020) for dilate, OS, EYLEA OCT, and follow-up OD as scheduled Eylea OCT.  There are no Patient Instructions on file for this visit.   Explained the diagnoses, plan, and follow up with the patient and they expressed understanding.  Patient expressed understanding of the importance of proper follow up care.   GClent DemarkRankin M.D. Diseases & Surgery of the Retina and Vitreous [facility]05/25/22     Abbreviations: M myopia (nearsighted); A astigmatism; H hyperopia (farsighted); P presbyopia; Mrx spectacle prescription;  CTL contact lenses; OD right eye; OS left eye; OU both eyes  XT exotropia; ET esotropia; PEK punctate epithelial keratitis; PEE punctate epithelial erosions; DES dry eye syndrome; MGD meibomian gland dysfunction; ATs artificial tears; PFAT's preservative free artificial tears; NCressonnuclear sclerotic cataract; PSC posterior subcapsular cataract; ERM epi-retinal membrane; PVD posterior vitreous detachment; RD retinal detachment; DM diabetes mellitus; DR diabetic retinopathy; NPDR non-proliferative diabetic retinopathy; PDR proliferative diabetic retinopathy; CSME clinically significant macular edema; DME diabetic macular edema; dbh dot blot hemorrhages; CWS cotton wool spot; POAG primary open angle glaucoma; C/D cup-to-disc ratio; HVF humphrey visual field; GVF goldmann visual field; OCT optical coherence tomography; IOP intraocular pressure; BRVO Branch retinal vein occlusion; CRVO central retinal vein occlusion; CRAO central retinal artery occlusion; BRAO branch retinal artery occlusion; RT retinal  tear; SB scleral buckle; PPV pars plana vitrectomy; VH Vitreous hemorrhage; PRP panretinal laser photocoagulation; IVK intravitreal kenalog; VMT vitreomacular traction; MH Macular hole;  NVD neovascularization of the disc; NVE neovascularization elsewhere; AREDS age related eye disease study; ARMD age related macular degeneration; POAG primary open angle glaucoma; EBMD epithelial/anterior basement membrane dystrophy; ACIOL anterior chamber intraocular lens; IOL intraocular lens; PCIOL posterior chamber intraocular lens; Phaco/IOL phacoemulsification with intraocular lens placement; PViroquaphotorefractive keratectomy; LASIK laser assisted in situ keratomileusis; HTN hypertension; DM diabetes mellitus; COPD chronic obstructive pulmonary disease

## 2020-07-16 NOTE — Assessment & Plan Note (Signed)
Now at 33-month status post injection left eye for disease activity most recently visible on detectable on OCT evaluation in fall 2021

## 2020-07-16 NOTE — Assessment & Plan Note (Signed)
Improved CME from ischemic CRV O only 5 weeks post recent Eylea, follow-up as scheduled

## 2020-07-24 DIAGNOSIS — M25552 Pain in left hip: Secondary | ICD-10-CM | POA: Diagnosis not present

## 2020-07-24 DIAGNOSIS — R2689 Other abnormalities of gait and mobility: Secondary | ICD-10-CM | POA: Diagnosis not present

## 2020-07-25 ENCOUNTER — Other Ambulatory Visit: Payer: Self-pay | Admitting: Cardiology

## 2020-07-28 DIAGNOSIS — R2689 Other abnormalities of gait and mobility: Secondary | ICD-10-CM | POA: Diagnosis not present

## 2020-07-28 DIAGNOSIS — M25552 Pain in left hip: Secondary | ICD-10-CM | POA: Diagnosis not present

## 2020-08-01 DIAGNOSIS — M25552 Pain in left hip: Secondary | ICD-10-CM | POA: Diagnosis not present

## 2020-08-01 DIAGNOSIS — R2689 Other abnormalities of gait and mobility: Secondary | ICD-10-CM | POA: Diagnosis not present

## 2020-08-05 DIAGNOSIS — M25552 Pain in left hip: Secondary | ICD-10-CM | POA: Diagnosis not present

## 2020-08-05 DIAGNOSIS — R2689 Other abnormalities of gait and mobility: Secondary | ICD-10-CM | POA: Diagnosis not present

## 2020-08-08 DIAGNOSIS — M25552 Pain in left hip: Secondary | ICD-10-CM | POA: Diagnosis not present

## 2020-08-11 DIAGNOSIS — M25552 Pain in left hip: Secondary | ICD-10-CM | POA: Diagnosis not present

## 2020-08-11 DIAGNOSIS — R2689 Other abnormalities of gait and mobility: Secondary | ICD-10-CM | POA: Diagnosis not present

## 2020-08-15 DIAGNOSIS — M25552 Pain in left hip: Secondary | ICD-10-CM | POA: Diagnosis not present

## 2020-08-27 DIAGNOSIS — M25552 Pain in left hip: Secondary | ICD-10-CM | POA: Diagnosis not present

## 2020-08-27 DIAGNOSIS — M6281 Muscle weakness (generalized): Secondary | ICD-10-CM | POA: Diagnosis not present

## 2020-08-29 DIAGNOSIS — R2689 Other abnormalities of gait and mobility: Secondary | ICD-10-CM | POA: Diagnosis not present

## 2020-08-29 DIAGNOSIS — M25552 Pain in left hip: Secondary | ICD-10-CM | POA: Diagnosis not present

## 2020-09-01 DIAGNOSIS — M25552 Pain in left hip: Secondary | ICD-10-CM | POA: Diagnosis not present

## 2020-09-01 DIAGNOSIS — R2689 Other abnormalities of gait and mobility: Secondary | ICD-10-CM | POA: Diagnosis not present

## 2020-09-05 ENCOUNTER — Other Ambulatory Visit (INDEPENDENT_AMBULATORY_CARE_PROVIDER_SITE_OTHER): Payer: Self-pay | Admitting: Ophthalmology

## 2020-09-08 ENCOUNTER — Other Ambulatory Visit: Payer: Self-pay

## 2020-09-08 ENCOUNTER — Ambulatory Visit (INDEPENDENT_AMBULATORY_CARE_PROVIDER_SITE_OTHER): Payer: Medicare HMO | Admitting: Ophthalmology

## 2020-09-08 ENCOUNTER — Encounter (INDEPENDENT_AMBULATORY_CARE_PROVIDER_SITE_OTHER): Payer: Self-pay | Admitting: Ophthalmology

## 2020-09-08 DIAGNOSIS — H353221 Exudative age-related macular degeneration, left eye, with active choroidal neovascularization: Secondary | ICD-10-CM | POA: Diagnosis not present

## 2020-09-08 DIAGNOSIS — H34811 Central retinal vein occlusion, right eye, with macular edema: Secondary | ICD-10-CM

## 2020-09-08 DIAGNOSIS — H348111 Central retinal vein occlusion, right eye, with retinal neovascularization: Secondary | ICD-10-CM | POA: Diagnosis not present

## 2020-09-08 NOTE — Assessment & Plan Note (Signed)
No signs of neovascularization OD, good peripheral anterior PRP

## 2020-09-08 NOTE — Assessment & Plan Note (Signed)
Currently at 7-week follow-up, no active disease recurrent follow-up as scheduled

## 2020-09-08 NOTE — Assessment & Plan Note (Signed)
CME secondary to CRV O, not responsive with antivegF.  We will continue to observe

## 2020-09-08 NOTE — Progress Notes (Signed)
09/08/2020     CHIEF COMPLAINT Patient presents for Retina Follow Up (3 month fu OD and OCT/Pt states, " I think that my vision is a little bit better."/Pt reports using Timolol BID OU/)   HISTORY OF PRESENT ILLNESS: Chad Russell is a 87 y.o. male who presents to the clinic today for:   HPI     Retina Follow Up           Diagnosis: CRVO/BRVO   Laterality: right eye   Onset: 3 months ago   Severity: mild   Duration: 3 months   Course: gradually improving   Comments: 3 month fu OD and OCT Pt states, " I think that my vision is a little bit better." Pt reports using Timolol BID OU        Last edited by Kendra Opitz, COA on 09/08/2020  1:24 PM.      Referring physician: [name], MD [address] [place],  [place] [id number]  HISTORICAL INFORMATION:   Selected notes from the [place]: Current Outpatient Medications (Ophthalmic Drugs)  Medication Sig   timolol (TIMOPTIC) 0.5 % ophthalmic solution INSTILL ONE DROP IN [facility] EYE DAILY   No current facility-administered medications for this visit. (Ophthalmic Drugs)   Current Outpatient Medications (Other)  Medication Sig   acetaminophen (TYLENOL) 500 MG tablet Take 1 tablet (500 mg total) by mouth every 8 (eight) hours.   bisacodyl (DULCOLAX) 5 MG EC tablet Take 1 tablet (5 mg total) by mouth daily as needed for moderate constipation.   carbidopa-levodopa (SINEMET) 25-100 MG tablet Take 1.5 tablets by mouth in the morning and at bedtime.   lisinopril-hydrochlorothiazide (ZESTORETIC) 10-12.5 MG tablet TAKE ONE TABLET BY MOUTH DAILY   Misc Natural Products (PROSTATE) CAPS Take 1 tablet by mouth daily. Prostate Plus otc supplement   Multiple Vitamin (MULTIVITAMIN WITH MINERALS) TABS tablet Take 1 tablet daily by mouth.   Multiple Vitamins-Minerals (PRESERVISION AREDS 2+MULTI VIT PO) Take 1 capsule by mouth 2 (two) times daily.   rivaroxaban (XARELTO) 20 MG TABS  tablet TAKE ONE TABLET BY MOUTH DAILY WITH SUPPER   senna (SENOKOT) 8.6 MG TABS tablet Take 1 tablet by mouth daily as needed for mild constipation.   vitamin C (ASCORBIC ACID) 500 MG tablet Take 500 mg by mouth daily.   No current facility-administered medications for this visit. (Other)      REVIEW OF SYSTEMS:    ALLERGIES Allergies  Allergen Reactions   Doxycycline Other (See Comments)    Upset stomach   Keflex [Cephalexin] Other (See Comments)    Abdominal discomfort 04-19-17 Pt has tolerated orally    PAST MEDICAL HISTORY Past Medical History:  Diagnosis Date   Arthritis    Atrial fibrillation ([place])    Balance problem 10/31/2013   BPH (benign prostatic hyperplasia) 2014   Urology Dr [name] with [facility] [phone]    Bradycardia    a. baseline HR in the 30's. Asymptomatic - no history of PPM placement.    CAD (coronary artery disease)    LHC 03/16/11: Mid LAD 10-20%, proximal circumflex 10%, mid RCA 20%, EF 55%.   Cataract    Complete heart block (HCC)    Fracture of femoral neck, right ([place]) 04/19/2017   GERD (gastroesophageal reflux disease)    takes occasional  zantac   Hx of echocardiogram    Echo 02/2011: EF 65-70%, normal wall motion, MAC, mild  MR, mild LAE, mild RVE   Hypertension    OSA (obstructive sleep apnea)    per patient went to have sleep study done about 2 years ago, bu they never F/U with him about results; but wife reports he still has periods of apnea at night    Talar fracture    casted no surg   Past Surgical History:  Procedure Laterality Date   ABDOMINAL AORTOGRAM W/LOWER EXTREMITY N/A 01/17/2017   Procedure: ABDOMINAL AORTOGRAM W/LOWER EXTREMITY;  Surgeon: Angelia Mould, MD;  Location: [facility];  Service: Cardiovascular;  Laterality: N/A;   COLONOSCOPY     Repeated and normal in 2011   FRACTURE SURGERY     HEMIARTHROPLASTY HIP Right 04/19/2017   HEMORROIDECTOMY     HIP ARTHROPLASTY Right 04/19/2017   Procedure:  ARTHROPLASTY BIPOLAR HIP (HEMIARTHROPLASTY);  Surgeon: Marchia Bond, MD;  Location: [facility];  Service: Orthopedics;  Laterality: Right;   HIP ARTHROPLASTY Left 12/16/2018   Procedure: ARTHROPLASTY BIPOLAR HIP (HEMIARTHROPLASTY);  Surgeon: Altamese Matteson, MD;  Location: [facility];  Service: Orthopedics;  Laterality: Left;   INGUINAL HERNIA REPAIR Right 02/26/2014   Procedure: LAPAROSCOPIC RIGHT INGUINAL HERNIA REPAIR;  Surgeon: Ralene Ok, MD;  Location: [facility];  Service: General;  Laterality: Right;   INGUINAL HERNIA REPAIR     INSERTION OF MESH Right 02/26/2014   Procedure: INSERTION OF MESH;  Surgeon: Ralene Ok, MD;  Location: [facility];  Service: General;  Laterality: Right;   LEFT HEART CATHETERIZATION WITH CORONARY ANGIOGRAM N/A 03/16/2011   Procedure: LEFT HEART CATHETERIZATION WITH CORONARY ANGIOGRAM;  Surgeon: Peter M Martinique, MD;  Location: [facility];  Service: Cardiovascular;  Laterality: N/A;   PROSTATE BIOPSY     negative - [facility]   TRANSURETHRAL RESECTION OF PROSTATE N/A 09/07/2016   Procedure: TRANSURETHRAL RESECTION OF THE PROSTATE (TURP);  Surgeon: Irine Seal, MD;  Location: [facility];  Service: Urology;  Laterality: N/A;    FAMILY HISTORY Family History  Problem Relation Age of Onset   Hypertension Mother    Multiple sclerosis Sister     SOCIAL HISTORY Social History   Tobacco Use   Smoking status: Former    Packs/day: 1.00    Years: 3.00    Pack years: 3.00    Types: Cigarettes    Quit date: 03/17/1955    Years since quitting: 65.5   Smokeless tobacco: Former  Scientific laboratory technician Use: Never used  Substance Use Topics   Alcohol use: Yes    Alcohol/week: 14.0 standard drinks    Types: 7 Cans of beer, 7 Standard drinks or equivalent per week    Comment: daily   Drug use: No         OPHTHALMIC EXAM:  Base Eye Exam     Visual Acuity (ETDRS)       Right Left   Dist cc CF at face 20/200 -1   Dist ph cc  20/80    Correction: Glasses          Tonometry (Tonopen, 1:28 PM)       Right Left   Pressure 14 14         Pupils       Pupils Dark Light Shape React APD   Right PERRL 4 3 Round Slow None   Left PERRL 4 3 Round Slow None         Visual Fields       Left Right   Restrictions Total superior  temporal, inferior temporal, superior nasal, inferior nasal deficiencies Partial outer inferior temporal, inferior nasal deficiencies         Extraocular Movement       Right Left    Full Full         Neuro/Psych     Oriented x3: Yes   Mood/Affect: Normal         Dilation     Both eyes: 1.0% Mydriacyl, 2.5% Phenylephrine @ 1:28 PM           Slit Lamp and Fundus Exam     External Exam       Right Left   External Normal Normal         Slit Lamp Exam       Right Left   Lids/Lashes Normal Normal   Conjunctiva/Sclera White and quiet White and quiet   Cornea Clear Clear   Anterior Chamber Deep and quiet Deep and quiet   Iris Round and reactive Round and reactive   Lens Posterior chamber intraocular lens Posterior chamber intraocular lens   Anterior Vitreous Normal Normal         Fundus Exam       Right Left   Posterior Vitreous Posterior vitreous detachment Posterior vitreous detachment   Disc Normal Pallor 2+   C/D Ratio 0.5 0.75   Macula Macular thickening, Cystoid macular edema, Microaneurysms, Atrophy, Retinal pigment epithelial atrophy, Drusen, Soft drusen, Intraretinal hemorrhage Geographic atrophy, Soft drusen,no hemorrhage   Vessels Old central retinal vein occlusion intraretinal hemorrhages Normal   Periphery  good sector PRP temporally, nearly 360 anteriorly Normal            IMAGING AND PROCEDURES  Imaging and Procedures for 09/08/20  OCT, Retina - OU - Both Eyes       Right Eye Quality was borderline. Scan locations included subfoveal. Progression has worsened. Findings include cystoid macular edema, abnormal foveal contour.   Left Eye Quality was good. Scan  locations included subfoveal. Central Foveal Thickness: 247. Progression has improved. Findings include abnormal foveal contour, inner retinal atrophy, outer retinal atrophy, central retinal atrophy, retinal drusen , subretinal hyper-reflective material, subretinal scarring, no SRF.   Notes Massive CME secondary to CRVO, OD, sensitive to Eylea in the past, and now at 13 weeks postinjection and now stable follow-up OD next as scheduled   OS, much less active CNVM, chronic subfoveal scarring limits the acuity OS yet no progression of intraretinal or subretinal fluid.,  Currently at 7-week follow-up              ASSESSMENT/PLAN:  Central retinal vein occlusion with neovascularization of right eye No signs of neovascularization OD, good peripheral anterior PRP  Central retinal vein occlusion with macular edema of right eye CME secondary to CRV O, not responsive with antivegF.  We will continue to observe  Exudative age-related macular degeneration of left eye with active choroidal neovascularization ([place]) Currently at 7-week follow-up, no active disease recurrent follow-up as scheduled     ICD-10-CM   1. Central retinal vein occlusion with neovascularization of right eye  H34.8111 OCT, Retina - OU - Both Eyes    2. Central retinal vein occlusion with macular edema of right eye  H34.8110     3. Exudative age-related macular degeneration of left eye with active choroidal neovascularization ([place])  H35.3221       1.  CRV O OD, with neovascularization now with protracted PRP anteriorly.  Clear media.  No signs of recurrence of NVE.  CME not responsive antivegF will continue to observe  2.  OS with history of wet AMD stable at 7.5 weeks postinjection follow-up as scheduled next  3.  Ophthalmic Meds Ordered this visit:  No orders of the defined types were placed in this encounter.      Return in about 3 months (around 12/09/2020) for DILATE OU, OCT, COLOR FP.  There are no  Patient Instructions on file for this visit.   Explained the diagnoses, plan, and follow up with the patient and they expressed understanding.  Patient expressed understanding of the importance of proper follow up care.   Clent Demark Skylyn Slezak M.D. Diseases & Surgery of the Retina and Vitreous [facility] 09/08/20     Abbreviations: M myopia (nearsighted); A astigmatism; H hyperopia (farsighted); P presbyopia; Mrx spectacle prescription;  CTL contact lenses; OD right eye; OS left eye; OU both eyes  XT exotropia; ET esotropia; PEK punctate epithelial keratitis; PEE punctate epithelial erosions; DES dry eye syndrome; MGD meibomian gland dysfunction; ATs artificial tears; PFAT's preservative free artificial tears; [place] nuclear sclerotic cataract; PSC posterior subcapsular cataract; ERM epi-retinal membrane; PVD posterior vitreous detachment; RD retinal detachment; DM diabetes mellitus; DR diabetic retinopathy; NPDR non-proliferative diabetic retinopathy; PDR proliferative diabetic retinopathy; CSME clinically significant macular edema; DME diabetic macular edema; dbh dot blot hemorrhages; CWS cotton wool spot; POAG primary open angle glaucoma; C/D cup-to-disc ratio; HVF humphrey visual field; GVF goldmann visual field; OCT optical coherence tomography; IOP intraocular pressure; BRVO Branch retinal vein occlusion; CRVO central retinal vein occlusion; CRAO central retinal artery occlusion; BRAO branch retinal artery occlusion; RT retinal tear; SB scleral buckle; PPV pars plana vitrectomy; VH Vitreous hemorrhage; PRP panretinal laser photocoagulation; IVK intravitreal kenalog; VMT vitreomacular traction; MH Macular hole;  NVD neovascularization of the disc; NVE neovascularization elsewhere; AREDS age related eye disease study; ARMD age related macular degeneration; POAG primary open angle glaucoma; EBMD epithelial/anterior basement membrane dystrophy; ACIOL anterior chamber intraocular lens; IOL  intraocular lens; PCIOL posterior chamber intraocular lens; Phaco/IOL phacoemulsification with intraocular lens placement; [place] photorefractive keratectomy; LASIK laser assisted in situ keratomileusis; HTN hypertension; DM diabetes mellitus; COPD chronic obstructive pulmonary disease

## 2020-09-17 ENCOUNTER — Ambulatory Visit: Payer: Medicare HMO | Admitting: Vascular Surgery

## 2020-10-14 ENCOUNTER — Inpatient Hospital Stay (HOSPITAL_COMMUNITY)
Admission: EM | Admit: 2020-10-14 | Discharge: 2020-10-21 | DRG: 690 | Disposition: A | Discharge status: Skilled Nursing Facility | Payer: Medicare HMO | Attending: Internal Medicine | Admitting: Internal Medicine

## 2020-10-14 ENCOUNTER — Other Ambulatory Visit: Payer: Self-pay

## 2020-10-14 ENCOUNTER — Encounter (HOSPITAL_COMMUNITY): Payer: Self-pay

## 2020-10-14 DIAGNOSIS — E860 Dehydration: Secondary | ICD-10-CM | POA: Diagnosis present

## 2020-10-14 DIAGNOSIS — Z87891 Personal history of nicotine dependence: Secondary | ICD-10-CM

## 2020-10-14 DIAGNOSIS — G20 Parkinson's disease: Secondary | ICD-10-CM | POA: Diagnosis present

## 2020-10-14 DIAGNOSIS — Z881 Allergy status to other antibiotic agents status: Secondary | ICD-10-CM

## 2020-10-14 DIAGNOSIS — R2689 Other abnormalities of gait and mobility: Secondary | ICD-10-CM | POA: Diagnosis not present

## 2020-10-14 DIAGNOSIS — I129 Hypertensive chronic kidney disease with stage 1 through stage 4 chronic kidney disease, or unspecified chronic kidney disease: Secondary | ICD-10-CM | POA: Diagnosis present

## 2020-10-14 DIAGNOSIS — Z8249 Family history of ischemic heart disease and other diseases of the circulatory system: Secondary | ICD-10-CM | POA: Diagnosis not present

## 2020-10-14 DIAGNOSIS — M80052D Age-related osteoporosis with current pathological fracture, left femur, subsequent encounter for fracture with routine healing: Secondary | ICD-10-CM | POA: Diagnosis not present

## 2020-10-14 DIAGNOSIS — R499 Unspecified voice and resonance disorder: Secondary | ICD-10-CM | POA: Diagnosis not present

## 2020-10-14 DIAGNOSIS — R6520 Severe sepsis without septic shock: Secondary | ICD-10-CM | POA: Diagnosis not present

## 2020-10-14 DIAGNOSIS — R112 Nausea with vomiting, unspecified: Secondary | ICD-10-CM | POA: Diagnosis not present

## 2020-10-14 DIAGNOSIS — M6281 Muscle weakness (generalized): Secondary | ICD-10-CM | POA: Diagnosis not present

## 2020-10-14 DIAGNOSIS — R001 Bradycardia, unspecified: Secondary | ICD-10-CM

## 2020-10-14 DIAGNOSIS — Z23 Encounter for immunization: Secondary | ICD-10-CM | POA: Diagnosis not present

## 2020-10-14 DIAGNOSIS — Z1629 Resistance to other single specified antibiotic: Secondary | ICD-10-CM | POA: Diagnosis present

## 2020-10-14 DIAGNOSIS — I442 Atrioventricular block, complete: Secondary | ICD-10-CM | POA: Diagnosis present

## 2020-10-14 DIAGNOSIS — Z7901 Long term (current) use of anticoagulants: Secondary | ICD-10-CM

## 2020-10-14 DIAGNOSIS — K219 Gastro-esophageal reflux disease without esophagitis: Secondary | ICD-10-CM | POA: Diagnosis present

## 2020-10-14 DIAGNOSIS — N390 Urinary tract infection, site not specified: Secondary | ICD-10-CM | POA: Diagnosis present

## 2020-10-14 DIAGNOSIS — D539 Nutritional anemia, unspecified: Secondary | ICD-10-CM | POA: Diagnosis present

## 2020-10-14 DIAGNOSIS — A419 Sepsis, unspecified organism: Principal | ICD-10-CM

## 2020-10-14 DIAGNOSIS — B957 Other staphylococcus as the cause of diseases classified elsewhere: Secondary | ICD-10-CM | POA: Diagnosis present

## 2020-10-14 DIAGNOSIS — I482 Chronic atrial fibrillation, unspecified: Secondary | ICD-10-CM | POA: Diagnosis present

## 2020-10-14 DIAGNOSIS — I10 Essential (primary) hypertension: Secondary | ICD-10-CM | POA: Diagnosis not present

## 2020-10-14 DIAGNOSIS — I480 Paroxysmal atrial fibrillation: Secondary | ICD-10-CM | POA: Diagnosis not present

## 2020-10-14 DIAGNOSIS — I2510 Atherosclerotic heart disease of native coronary artery without angina pectoris: Secondary | ICD-10-CM | POA: Diagnosis present

## 2020-10-14 DIAGNOSIS — E872 Acidosis: Secondary | ICD-10-CM | POA: Diagnosis present

## 2020-10-14 DIAGNOSIS — Z820 Family history of epilepsy and other diseases of the nervous system: Secondary | ICD-10-CM | POA: Diagnosis not present

## 2020-10-14 DIAGNOSIS — N179 Acute kidney failure, unspecified: Secondary | ICD-10-CM | POA: Diagnosis present

## 2020-10-14 DIAGNOSIS — N1831 Chronic kidney disease, stage 3a: Secondary | ICD-10-CM | POA: Diagnosis present

## 2020-10-14 DIAGNOSIS — N400 Enlarged prostate without lower urinary tract symptoms: Secondary | ICD-10-CM | POA: Diagnosis not present

## 2020-10-14 DIAGNOSIS — D6859 Other primary thrombophilia: Secondary | ICD-10-CM | POA: Diagnosis present

## 2020-10-14 DIAGNOSIS — R300 Dysuria: Secondary | ICD-10-CM | POA: Diagnosis not present

## 2020-10-14 DIAGNOSIS — M1990 Unspecified osteoarthritis, unspecified site: Secondary | ICD-10-CM | POA: Diagnosis present

## 2020-10-14 DIAGNOSIS — Z20822 Contact with and (suspected) exposure to covid-19: Secondary | ICD-10-CM | POA: Diagnosis present

## 2020-10-14 DIAGNOSIS — G4733 Obstructive sleep apnea (adult) (pediatric): Secondary | ICD-10-CM | POA: Diagnosis present

## 2020-10-14 DIAGNOSIS — R531 Weakness: Secondary | ICD-10-CM | POA: Diagnosis not present

## 2020-10-14 DIAGNOSIS — Z9181 History of falling: Secondary | ICD-10-CM | POA: Diagnosis not present

## 2020-10-14 DIAGNOSIS — I959 Hypotension, unspecified: Secondary | ICD-10-CM | POA: Diagnosis not present

## 2020-10-14 DIAGNOSIS — R0902 Hypoxemia: Secondary | ICD-10-CM | POA: Diagnosis not present

## 2020-10-14 DIAGNOSIS — N3000 Acute cystitis without hematuria: Secondary | ICD-10-CM | POA: Diagnosis not present

## 2020-10-14 DIAGNOSIS — Z7401 Bed confinement status: Secondary | ICD-10-CM | POA: Diagnosis not present

## 2020-10-14 DIAGNOSIS — Z79899 Other long term (current) drug therapy: Secondary | ICD-10-CM

## 2020-10-14 DIAGNOSIS — I252 Old myocardial infarction: Secondary | ICD-10-CM

## 2020-10-14 LAB — URINALYSIS, ROUTINE W REFLEX MICROSCOPIC
Bilirubin Urine: NEGATIVE
Glucose, UA: NEGATIVE mg/dL
Ketones, ur: NEGATIVE mg/dL
Nitrite: NEGATIVE
Protein, ur: 100 mg/dL — AB
Specific Gravity, Urine: 1.018 (ref 1.005–1.030)
WBC, UA: >50 WBC/hpf — ABNORMAL HIGH (ref 0–5)
pH: 6 (ref 5.0–8.0)

## 2020-10-14 LAB — CBC WITH DIFFERENTIAL/PLATELET
Abs Immature Granulocytes: 0.04 10*3/uL (ref 0.00–0.07)
Basophils Absolute: 0 10*3/uL (ref 0.0–0.1)
Basophils Relative: 0 %
Eosinophils Absolute: 0.1 10*3/uL (ref 0.0–0.5)
Eosinophils Relative: 1 %
HCT: 33.5 % — ABNORMAL LOW (ref 39.0–52.0)
Hemoglobin: 10.7 g/dL — ABNORMAL LOW (ref 13.0–17.0)
Immature Granulocytes: 1 %
Lymphocytes Relative: 16 %
Lymphs Abs: 1.4 10*3/uL (ref 0.7–4.0)
MCH: 31.8 pg (ref 26.0–34.0)
MCHC: 31.9 g/dL (ref 30.0–36.0)
MCV: 99.4 fL (ref 80.0–100.0)
Monocytes Absolute: 0.8 10*3/uL (ref 0.1–1.0)
Monocytes Relative: 9 %
Neutro Abs: 6.4 10*3/uL (ref 1.7–7.7)
Neutrophils Relative %: 73 %
Platelets: 228 10*3/uL (ref 150–400)
RBC: 3.37 MIL/uL — ABNORMAL LOW (ref 4.22–5.81)
RDW: 14.7 % (ref 11.5–15.5)
WBC: 8.8 10*3/uL (ref 4.0–10.5)
nRBC: 0 % (ref 0.0–0.2)

## 2020-10-14 LAB — COMPREHENSIVE METABOLIC PANEL
ALT: 11 U/L (ref 0–44)
AST: 18 U/L (ref 15–41)
Albumin: 3.6 g/dL (ref 3.5–5.0)
Alkaline Phosphatase: 59 U/L (ref 38–126)
Anion gap: 9 (ref 5–15)
BUN: 70 mg/dL — ABNORMAL HIGH (ref 8–23)
CO2: 27 mmol/L (ref 22–32)
Calcium: 9.4 mg/dL (ref 8.9–10.3)
Chloride: 100 mmol/L (ref 98–111)
Creatinine, Ser: 1.95 mg/dL — ABNORMAL HIGH (ref 0.61–1.24)
GFR, Estimated: 32 mL/min — ABNORMAL LOW (ref >60)
Glucose, Bld: 114 mg/dL — ABNORMAL HIGH (ref 70–99)
Potassium: 4.1 mmol/L (ref 3.5–5.1)
Sodium: 136 mmol/L (ref 135–145)
Total Bilirubin: 0.9 mg/dL (ref 0.3–1.2)
Total Protein: 7 g/dL (ref 6.5–8.1)

## 2020-10-14 LAB — RESP PANEL BY RT-PCR (FLU A&B, COVID) ARPGX2
Influenza A by PCR: NEGATIVE
Influenza B by PCR: NEGATIVE
SARS Coronavirus 2 by RT PCR: NEGATIVE

## 2020-10-14 LAB — LACTIC ACID, PLASMA
Lactic Acid, Venous: 2.1 mmol/L (ref 0.5–1.9)
Lactic Acid, Venous: 2.1 mmol/L (ref 0.5–1.9)

## 2020-10-14 MED ORDER — ACETAMINOPHEN 325 MG PO TABS
650.0000 mg | ORAL_TABLET | Freq: Four times a day (QID) | ORAL | Status: DC | PRN
  Administered 2020-10-16 – 2020-10-19 (×2): 650 mg via ORAL
  Filled 2020-10-14 (×2): qty 2

## 2020-10-14 MED ORDER — SODIUM CHLORIDE 0.9 % IV BOLUS
1000.0000 mL | Freq: Once | INTRAVENOUS | Status: AC
  Administered 2020-10-14: 1000 mL via INTRAVENOUS

## 2020-10-14 MED ORDER — SODIUM CHLORIDE 0.9 % IV SOLN: INTRAVENOUS | Status: AC

## 2020-10-14 MED ORDER — RIVAROXABAN 20 MG PO TABS
20.0000 mg | ORAL_TABLET | Freq: Every day | ORAL | Status: DC
  Administered 2020-10-14 – 2020-10-20 (×7): 20 mg via ORAL
  Filled 2020-10-14 (×8): qty 1

## 2020-10-14 MED ORDER — CARBIDOPA-LEVODOPA 25-100 MG PO TABS
1.5000 | ORAL_TABLET | Freq: Two times a day (BID) | ORAL | Status: DC
  Administered 2020-10-14 – 2020-10-21 (×13): 1.5 via ORAL
  Filled 2020-10-14 (×12): qty 2
  Filled 2020-10-14: qty 1.5
  Filled 2020-10-14: qty 2

## 2020-10-14 MED ORDER — SODIUM CHLORIDE 0.9 % IV SOLN
2.0000 g | INTRAVENOUS | Status: DC
  Administered 2020-10-14: 2 g via INTRAVENOUS
  Filled 2020-10-14: qty 20

## 2020-10-14 MED ORDER — SODIUM CHLORIDE 0.9 % IV SOLN
1.0000 g | INTRAVENOUS | Status: DC
  Administered 2020-10-15 – 2020-10-17 (×3): 1 g via INTRAVENOUS
  Filled 2020-10-14: qty 1
  Filled 2020-10-14 (×3): qty 10

## 2020-10-14 MED ORDER — ACETAMINOPHEN 650 MG RE SUPP: 650.0000 mg | Freq: Four times a day (QID) | RECTAL | Status: DC | PRN

## 2020-10-14 NOTE — ED Triage Notes (Signed)
Per EMS- patient's wife reports that the patient has had cloudy urine x 2 days and patient reports slight dysuria.  Patient's HR-35 and patient and wife report this is normal.

## 2020-10-14 NOTE — ED Provider Notes (Signed)
[facility] DEPT Provider Note   CSN: [id number] Arrival date & time: 10/14/20  1439     History Chief Complaint  Patient presents with   Dysuria    Chad Russell is a 87 y.o. male with past medical history of bradycardia with with prior past medical history of bradycardia with complete heart block, hypertension, CAD that presents to the emergency department today for weakness.  Wife states that she has noticed that his urine has been discolored for the past 3 days, patient denies any dysuria or hematuria.  Denies any pain anywhere, no abdominal pain nausea vomiting no back pain.  Denies any chest pain or shortness of breath.  Patient states that he just feels weak, has not been able to walk for the past 4 days because of his weakness.  States he is normally able to walk with a walker.  Denies any fevers at home.  Patient denies any syncopal episodes.  Patient does not have a pacemaker for his complete heart block, does follow cardiology regularly.  Wife states that his p.o. intake has been lower than normal for the past couple of days, patient has not had much of an appetite.  No change in bowel habits.Marland Kitchen  HPI     Past Medical History:  Diagnosis Date   Arthritis    Atrial fibrillation [facility])    Balance problem 10/31/2013   BPH (benign prostatic hyperplasia) 2014   Urology Dr [name] with [facility] [phone]    Bradycardia    a. baseline HR in the 30's. Asymptomatic - no history of PPM placement.    CAD (coronary artery disease)    LHC 03/16/11: Mid LAD 10-20%, proximal circumflex 10%, mid RCA 20%, EF 55%.   Cataract    Complete heart block (HCC)    Fracture of femoral neck, right (HCC) 04/19/2017   GERD (gastroesophageal reflux disease)    takes occasional  zantac   Hx of echocardiogram    Echo 02/2011: EF 65-70%, normal wall motion, MAC, mild MR, mild LAE, mild RVE   Hypertension    OSA (obstructive sleep apnea)    per patient went to have sleep study  done about 2 years ago, bu they never F/U with him about results; but wife reports he still has periods of apnea at night    Talar fracture    casted no surg    Patient Active Problem List   Diagnosis Date Noted   UTI (urinary tract infection) 10/14/2020   Bradycardia 10/14/2020   Educated about COVID-19 virus infection 11/29/2019   Exudative age-related macular degeneration of left eye with active choroidal neovascularization ([place]) 10/02/2019   Central retinal vein occlusion with macular edema of right eye 06/20/2019   Central retinal vein occlusion with neovascularization of right eye 06/20/2019   Ocular ischemic syndrome 06/20/2019   Retinal hemorrhage of left eye 06/20/2019   Retinal hemorrhage of right eye 06/20/2019   AKI (acute kidney injury) ([place]) 12/15/2018   Hematoma of scalp    Heart block    Chest pain 05/04/2018   Fracture of femoral neck, left ([place]) 04/19/2017   MGUS (monoclonal gammopathy of unknown significance) 01/15/2017   BPH with obstruction/lower urinary tract symptoms 09/07/2016   Preoperative cardiovascular examination 08/24/2016   Urinary retention 05/13/2016   Balance problem 10/31/2013   OSA (obstructive sleep apnea) 10/31/2013   BPH (benign prostatic hyperplasia) 08/28/2012   Atrial fibrillation ([place]) 06/08/2012   Hyperplasia of prostate with lower urinary tract symptoms (LUTS)  04/28/2012   Moderate malnutrition ([place]) 04/28/2012   Infection of urinary tract - recurrent 04/26/2012   NSTEMI (non-ST elevated myocardial infarction) ([place]) 03/15/2011   Complete heart block ([place]) 03/15/2011   HTN (hypertension) 03/15/2011    Past Surgical History:  Procedure Laterality Date   ABDOMINAL AORTOGRAM W/LOWER EXTREMITY N/A 01/17/2017   Procedure: ABDOMINAL AORTOGRAM W/LOWER EXTREMITY;  Surgeon: Angelia Mould, MD;  Location: [place] [facility];  Service: Cardiovascular;  Laterality: N/A;   COLONOSCOPY     Repeated and normal in 2011   FRACTURE SURGERY      HEMIARTHROPLASTY HIP Right 04/19/2017   HEMORROIDECTOMY     HIP ARTHROPLASTY Right 04/19/2017   Procedure: ARTHROPLASTY BIPOLAR HIP (HEMIARTHROPLASTY);  Surgeon: Marchia Bond, MD;  Location: [facility];  Service: Orthopedics;  Laterality: Right;   HIP ARTHROPLASTY Left 12/16/2018   Procedure: ARTHROPLASTY BIPOLAR HIP (HEMIARTHROPLASTY);  Surgeon: Altamese Elko, MD;  Location: [facility];  Service: Orthopedics;  Laterality: Left;   INGUINAL HERNIA REPAIR Right 02/26/2014   Procedure: LAPAROSCOPIC RIGHT INGUINAL HERNIA REPAIR;  Surgeon: Ralene Ok, MD;  Location: [facility];  Service: General;  Laterality: Right;   INGUINAL HERNIA REPAIR     INSERTION OF MESH Right 02/26/2014   Procedure: INSERTION OF MESH;  Surgeon: Ralene Ok, MD;  Location: [facility];  Service: General;  Laterality: Right;   LEFT HEART CATHETERIZATION WITH CORONARY ANGIOGRAM N/A 03/16/2011   Procedure: LEFT HEART CATHETERIZATION WITH CORONARY ANGIOGRAM;  Surgeon: Peter M Martinique, MD;  Location[facility] C[facility] LAB;  Service: Cardiovascular;  Laterality: N/A;   PROSTATE BIOPSY     negative - [facility]   TRANSURETHRAL RESECTION OF PROSTATE N/A 09/07/2016   Procedure: TRANSURETHRAL RESECTION OF THE PROSTATE (TURP);  Surgeon: Irine Seal, MD;  Location: [facility];  Service: Urology;  Laterality: N/A;       Family History  Problem Relation Age of Onset   Hypertension Mother    Multiple sclerosis Sister     Social History   Tobacco Use   Smoking status: Former    Packs/day: 1.00    Years: 3.00    Pack years: 3.00    Types: Cigarettes    Quit date: 03/17/1955    Years since quitting: 65.6   Smokeless tobacco: Former  Scientific laboratory technician Use: Never used  Substance Use Topics   Alcohol use: Yes    Alcohol/week: 14.0 standard drinks    Types: 7 Cans of beer, 7 Standard drinks or equivalent per week    Comment: daily   Drug use: No    Home Medications Prior to Admission medications   Medication Sig Start Date End Date  Taking? Authorizing Provider  acetaminophen (TYLENOL) 500 MG tablet Take 1 tablet (500 mg total) by mouth every 8 (eight) hours. Patient taking differently: Take 500 mg by mouth every 8 (eight) hours as needed for moderate pain. 12/26/18  Yes Debbe Odea, MD  carbidopa-levodopa (SINEMET) 25-100 MG tablet Take 1.5 tablets by mouth in the morning and at bedtime. 06/11/19  Yes Melicia Esqueda, Donika K, DO  docusate sodium (COLACE) 100 MG capsule Take 200 mg by mouth daily as needed for mild constipation.   Yes [provider]  lisinopril-hydrochlorothiazide (ZESTORETIC) 10-12.5 MG tablet TAKE ONE TABLET BY MOUTH DAILY Patient taking differently: Take 1 tablet by mouth daily. 07/25/20  Yes Minus Breeding, MD  Misc Natural Products (PROSTATE) CAPS Take 1 tablet by mouth 2 (two) times daily. Prostate Plus otc supplement   Yes [provider]  Multiple Vitamins-Minerals (PRESERVISION AREDS 2+MULTI VIT PO) Take 1 capsule by mouth 2 (two) times daily.   Yes [provider]  rivaroxaban (XARELTO) 20 MG TABS tablet TAKE ONE TABLET BY MOUTH DAILY WITH SUPPER Patient taking differently: Take 20 mg by mouth daily with supper. 11/05/19  Yes Minus Breeding, MD  senna (SENOKOT) 8.6 MG TABS tablet Take 1 tablet by mouth daily as needed for mild constipation.   Yes [provider]  timolol (TIMOPTIC) 0.5 % ophthalmic solution INSTILL ONE DROP IN [facility] EYE DAILY Patient taking differently: Place 1 drop into both eyes 2 (two) times daily. 09/08/20  Yes Rankin, Clent Demark, MD  vitamin C (ASCORBIC ACID) 500 MG tablet Take 500 mg by mouth daily.   Yes [provider]  bisacodyl (DULCOLAX) 5 MG EC tablet Take 1 tablet (5 mg total) by mouth daily as needed for moderate constipation. Patient not taking: No sig reported 12/26/18   Debbe Odea, MD    Allergies    Doxycycline and Keflex [cephalexin]  Review of Systems   Review of Systems  Constitutional:  Positive for fatigue. Negative for  chills, diaphoresis and fever.  HENT:  Negative for congestion, sore throat and trouble swallowing.   Eyes:  Negative for pain and visual disturbance.  Respiratory:  Negative for cough, shortness of breath and wheezing.   Cardiovascular:  Negative for chest pain, palpitations and leg swelling.  Gastrointestinal:  Negative for abdominal distention, abdominal pain, diarrhea, nausea and vomiting.  Genitourinary:  Negative for decreased urine volume, difficulty urinating, dysuria, flank pain, frequency, genital sores, hematuria, penile discharge, penile pain, penile swelling, scrotal swelling, testicular pain and urgency.  Musculoskeletal:  Negative for back pain, neck pain and neck stiffness.  Skin:  Negative for pallor.  Neurological:  Positive for weakness. Negative for dizziness, speech difficulty and headaches.  Psychiatric/Behavioral:  Negative for confusion.    Physical Exam Updated Vital Signs BP (!) 132/94   Pulse (!) 30   Temp 98.3 F (36.8 C) (Oral)   Resp 12   Ht 6' (1.829 m)   Wt 81.6 kg   SpO2 99%   BMI 24.41 kg/m   Physical Exam Constitutional:      General: He is not in acute distress.    Appearance: Normal appearance. He is ill-appearing. He is not toxic-appearing or diaphoretic.     Comments: Patient appears chronically ill, in no acute distress.  HENT:     Mouth/Throat:     Mouth: Mucous membranes are moist.     Pharynx: Oropharynx is clear.  Eyes:     General: No scleral icterus.    Extraocular Movements: Extraocular movements intact.     Pupils: Pupils are equal, round, and reactive to light.  Cardiovascular:     Rate and Rhythm: Regular rhythm. Bradycardia present.     Pulses: Normal pulses.     Heart sounds: Normal heart sounds.  Pulmonary:     Effort: Pulmonary effort is normal. No respiratory distress.     Breath sounds: Normal breath sounds. No stridor. No wheezing, rhonchi or rales.  Chest:     Chest wall: No tenderness.  Abdominal:     General:  Abdomen is flat. There is no distension.     Palpations: Abdomen is soft.     Tenderness: There is no abdominal tenderness. There is no guarding or rebound.  Musculoskeletal:        General: No swelling or tenderness. Normal range of motion.  Cervical back: Normal range of motion and neck supple. No rigidity.     Right lower leg: No edema.     Left lower leg: No edema.  Skin:    General: Skin is warm and dry.     Capillary Refill: Capillary refill takes less than 2 seconds.     Coloration: Skin is not pale.  Neurological:     General: No focal deficit present.     Mental Status: He is alert and oriented to person, place, and time.     Motor: Weakness present.     Comments: Alert. Clear speech. No facial droop. CNIII-XII grossly intact. Bilateral upper and lower extremities' sensation grossly intact. 4/5 symmetric strength with grip strength and with plantar and dorsi flexion bilaterally.   Psychiatric:        Mood and Affect: Mood normal.        Behavior: Behavior normal.    ED Results / Procedures / Treatments   Labs (all labs ordered are listed, but only abnormal results are displayed) Labs Reviewed  URINALYSIS, ROUTINE W REFLEX MICROSCOPIC - Abnormal; Notable for the following components:      Result Value   APPearance TURBID (*)    Hgb urine dipstick SMALL (*)    Protein, ur 100 (*)    Leukocytes,Ua LARGE (*)    WBC, UA >50 (*)    Bacteria, UA MANY (*)    All other components within normal limits  CBC WITH DIFFERENTIAL/PLATELET - Abnormal; Notable for the following components:   RBC 3.37 (*)    Hemoglobin 10.7 (*)    HCT 33.5 (*)    All other components within normal limits  COMPREHENSIVE METABOLIC PANEL - Abnormal; Notable for the following components:   Glucose, Bld 114 (*)    BUN 70 (*)    Creatinine, Ser 1.95 (*)    GFR, Estimated 32 (*)    All other components within normal limits  LACTIC ACID, PLASMA - Abnormal; Notable for the following components:    Lactic Acid, Venous 2.1 (*)    All other components within normal limits  LACTIC ACID, PLASMA - Abnormal; Notable for the following components:   Lactic Acid, Venous 2.1 (*)    All other components within normal limits  RESP PANEL BY RT-PCR (FLU A&B, COVID) ARPGX2  CULTURE, BLOOD (SINGLE)  URINE CULTURE  CULTURE, BLOOD (SINGLE)    EKG EKG Interpretation  Date/Time:  Tuesday October 14 2020 16:57:55 EDT Ventricular Rate:  30 PR Interval:    QRS Duration: 163 QT Interval:  569 QTC Calculation: 402 R Axis:   62 Text Interpretation: Junctional rhythm Right bundle branch block ST depression, consider ischemia, diffuse lds No significant change since last tracing patient has slow AF Confirmed by Varney Biles ([id number]) on 10/14/2020 5:01:57 PM  Radiology No results found.  Procedures .Critical Care  Date/Time: 10/14/2020 5:28 PM Performed by: Alfredia Client, PA-C Authorized by: Alfredia Client, PA-C   Critical care provider statement:    Critical care time (minutes):  45   Critical care was time spent personally by me on the following activities:  Discussions with consultants, evaluation of patient's response to treatment, examination of patient, ordering and performing treatments and interventions, ordering and review of laboratory studies, ordering and review of radiographic studies, pulse oximetry, re-evaluation of patient's condition, obtaining history from patient or surrogate and review of old charts   Medications Ordered in ED Medications  rivaroxaban (XARELTO) tablet 20 mg (has no administration in time range)  carbidopa-levodopa (SINEMET IR) 25-100 MG per tablet immediate release 1.5 tablet (has no administration in time range)  acetaminophen (TYLENOL) tablet 650 mg (has no administration in time range)    Or  acetaminophen (TYLENOL) suppository 650 mg (has no administration in time range)  0.9 %  sodium chloride infusion (has no administration in time range)  cefTRIAXone  (ROCEPHIN) 1 g in sodium chloride 0.9 % 100 mL IVPB (has no administration in time range)  sodium chloride 0.9 % bolus 1,000 mL (0 mLs Intravenous Stopped 10/14/20 1953)  sodium chloride 0.9 % bolus 1,000 mL (0 mLs Intravenous Stopped 10/14/20 1953)    ED Course  I have reviewed the triage vital signs and the nursing notes.  Pertinent labs & imaging results that were available during my care of the patient were reviewed by me and considered in my medical decision making (see chart for details).    MDM Rules/Calculators/A&P                          87 year old male that presents emerged department today for weakness, appears globally weak.  Patient has not been able to walk for the past 4 days due to weakness.  Patient's urine appears infected based on sample that patient had brought in.  Blood pressure of 92/50 with CMP resulting in AKI, patient does have organ injury with infection, will initiate code sepsis at this time while awaiting other lab work.  IV fluids, cultures and antibiotics initiated.  Pulse in the 30s, this appears chronic for patient.  We will continue to monitor.  Lactic acid back at 2.1, patient does not have a white count.  Urinalysis back which appears infected, will admit to hospitalist at this time.   Patient admitted to Dr. Marlowe Sax.   The patient appears reasonably stabilized for admission considering the current resources, flow, and capabilities available in the ED at this time, and I doubt any other [facility] requiring further screening and/or treatment in the ED prior to admission.  I discussed this case with my attending physician who cosigned this note including patient's presenting symptoms, physical exam, and planned diagnostics and interventions. Attending physician stated agreement with plan or made changes to plan which were implemented.   Attending physician assessed patient at bedside.    Final Clinical Impression(s) / ED Diagnoses Final diagnoses:  Sepsis  with acute renal failure without septic shock, due to unspecified organism, unspecified acute renal failure type [facility])    Rx / [place] Orders ED Discharge Orders     None        Alfredia Client, PA-C 10/14/20 2202    Varney Biles, MD 10/15/20 [phone]

## 2020-10-14 NOTE — Sepsis Progress Note (Signed)
Notified bedside nurse of need to draw repeat lactic acid. 

## 2020-10-14 NOTE — ED Provider Notes (Addendum)
Emergency Medicine Provider Triage Evaluation Note  Chad Russell , a 87 y.o. male  was evaluated in triage.  Pt complains of diarrhea, cloudy urine without odor, for past several days.  Patient endorses history of previous urinary tract infections.  He also reports he has been having a little bit of confusion, unsteadiness on his feet.  He reports he is completely able to empty his bladder.  He denies abdominal pain, chest pain, shortness of breath.  A urine sample from home.  Of note, patient has a heart rate of 45 today, his bradycardia is a known finding, and is present on our most recent EKG of 1 year ago.  Review of Systems  Positive: Dysuria, cloudy urine Negative: Hematuria, urinary urgency, fever, chills  Physical Exam  There were no vitals taken for this visit. Gen:   Pleasant older man, sitting in chair in no acute distress Resp:  Normal effort MSK:   Moves extremities without difficulty, 5 out of 5 strength in bilateral upper extremities, and lower extremities, able to follow commands, brief cranial nerve exam without abnormality, no signs of facial drooping Other:  No tenderness to palpation of entire abdomen, including suprapubic region  Medical Decision Making  Medically screening exam initiated at 3:00 PM.  Appropriate orders placed.  Cristan I Crosslin was informed that the remainder of the evaluation will be completed by another provider, this initial triage assessment does not replace that evaluation, and the importance of remaining in the ED until their evaluation is complete.  Dysuria, cloudy urine   Bradly, Bozz[place], PA-C 10/14/20 1505    Crue, [place]ugh, PA-C 10/14/20 [place], DO 10/14/20 1545

## 2020-10-14 NOTE — Sepsis Progress Note (Signed)
eLink is monitoring this Code Sepsis. °

## 2020-10-14 NOTE — H&P (Signed)
History and Physical    Chad Russell JJK:[id number] DOB: 06-17-1926 DOA: 10/14/2020  PCP: [name], MD Patient coming from: Home  Chief Complaint: Generalized weakness  HPI: Chad Russell is a 87 y.o. male with medical history significant of Parkinson's disease, A. fib on anticoagulation, complete heart block for > 20 years with no indication for pacemaker, CAD, hypertension, OSA presenting with a chief complaint of generalized weakness.  Patient states he has been feeling weak for the past 5 days.  Also endorsing poor oral intake.  Denies any falls. Endorsing urinary frequency and urgency.  Denies dysuria.  Denies nausea, vomiting, or abdominal pain.  Denies chest pain, shortness of breath, or syncopal episodes.  ED Course: Bradycardic with heart rate in the 30s but not hypotensive.  Labs showing no leukocytosis.  Hemoglobin stable.  BUN 70, creatinine 1.9 (baseline 0.8).  UA with large amount of leukocytes, greater than 50 WBCs, and many bacteria.  Urine culture pending.  Borderline lactic acidosis.  Blood cultures pending.  Screening COVID/flu test negative. Patient was given IV ceftriaxone and 2 L fluid boluses.  Review of Systems:  All systems reviewed and apart from history of presenting illness, are negative.  Past Medical History:  Diagnosis Date   Arthritis    Atrial fibrillation [facility])    Balance problem 10/31/2013   BPH (benign prostatic hyperplasia) 2014   Urology Dr [name] with [facility] [phone]    Bradycardia    a. baseline HR in the 30's. Asymptomatic - no history of PPM placement.    CAD (coronary artery disease)    LHC 03/16/11: Mid LAD 10-20%, proximal circumflex 10%, mid RCA 20%, EF 55%.   Cataract    Complete heart block (HCC)    Fracture of femoral neck, right (HCC) 04/19/2017   GERD (gastroesophageal reflux disease)    takes occasional  zantac   Hx of echocardiogram    Echo 02/2011: EF 65-70%, normal wall motion, MAC, mild MR, mild LAE, mild RVE    Hypertension    OSA (obstructive sleep apnea)    per patient went to have sleep study done about 2 years ago, bu they never F/U with him about results; but wife reports he still has periods of apnea at night    Talar fracture    casted no surg    Past Surgical History:  Procedure Laterality Date   ABDOMINAL AORTOGRAM W/LOWER EXTREMITY N/A 01/17/2017   Procedure: ABDOMINAL AORTOGRAM W/LOWER EXTREMITY;  Surgeon: Angelia Mould, MD;  Location: [facility];  Service: Cardiovascular;  Laterality: N/A;   COLONOSCOPY     Repeated and normal in 2011   FRACTURE SURGERY     HEMIARTHROPLASTY HIP Right 04/19/2017   HEMORROIDECTOMY     HIP ARTHROPLASTY Right 04/19/2017   Procedure: ARTHROPLASTY BIPOLAR HIP (HEMIARTHROPLASTY);  Surgeon: Marchia Bond, MD;  Location: [place];  Service: Orthopedics;  Laterality: Right;   HIP ARTHROPLASTY Left 12/16/2018   Procedure: ARTHROPLASTY BIPOLAR HIP (HEMIARTHROPLASTY);  Surgeon: Altamese Crowley, MD;  Location: [place];  Service: Orthopedics;  Laterality: Left;   INGUINAL HERNIA REPAIR Right 02/26/2014   Procedure: LAPAROSCOPIC RIGHT INGUINAL HERNIA REPAIR;  Surgeon: Ralene Ok, MD;  Location: [place];  Service: General;  Laterality: Right;   INGUINAL HERNIA REPAIR     INSERTION OF MESH Right 02/26/2014   Procedure: INSERTION OF MESH;  Surgeon: Ralene Ok, MD;  Location: [place];  Service: General;  Laterality: Right;   LEFT HEART CATHETERIZATION WITH CORONARY ANGIOGRAM  N/A 03/16/2011   Procedure: LEFT HEART CATHETERIZATION WITH CORONARY ANGIOGRAM;  Surgeon: Peter M Martinique, MD;  Location: [facility] CATH LAB;  Service: Cardiovascular;  Laterality: N/A;   PROSTATE BIOPSY     negative - [facility]   TRANSURETHRAL RESECTION OF PROSTATE N/A 09/07/2016   Procedure: TRANSURETHRAL RESECTION OF THE PROSTATE (TURP);  Surgeon: Irine Seal, MD;  Location: [facility];  Service: Urology;  Laterality: N/A;     reports that he quit smoking about 65 years ago. His  smoking use included cigarettes. He has a 3.00 pack-year smoking history. He has quit using smokeless tobacco. He reports current alcohol use of about 14.0 standard drinks per week. He reports that he does not use drugs.  Allergies  Allergen Reactions   Doxycycline Other (See Comments)    Upset stomach   Keflex [Cephalexin] Other (See Comments)    Abdominal discomfort 04-19-17 Pt has tolerated orally    Family History  Problem Relation Age of Onset   Hypertension Mother    Multiple sclerosis Sister     Prior to Admission medications   Medication Sig Start Date End Date Taking? Authorizing Provider  acetaminophen (TYLENOL) 500 MG tablet Take 1 tablet (500 mg total) by mouth every 8 (eight) hours. Patient taking differently: Take 500 mg by mouth every 8 (eight) hours as needed for moderate pain. 12/26/18  Yes Debbe Odea, MD  carbidopa-levodopa (SINEMET) 25-100 MG tablet Take 1.5 tablets by mouth in the morning and at bedtime. 06/11/19  Yes Patel, Donika K, DO  docusate sodium (COLACE) 100 MG capsule Take 200 mg by mouth daily as needed for mild constipation.   Yes [provider]  lisinopril-hydrochlorothiazide (ZESTORETIC) 10-12.5 MG tablet TAKE ONE TABLET BY MOUTH DAILY Patient taking differently: Take 1 tablet by mouth daily. 07/25/20  Yes Minus Breeding, MD  Misc Natural Products (PROSTATE) CAPS Take 1 tablet by mouth 2 (two) times daily. Prostate Plus otc supplement   Yes [provider]  Multiple Vitamins-Minerals (PRESERVISION AREDS 2+MULTI VIT PO) Take 1 capsule by mouth 2 (two) times daily.   Yes [provider]  rivaroxaban (XARELTO) 20 MG TABS tablet TAKE ONE TABLET BY MOUTH DAILY WITH SUPPER Patient taking differently: Take 20 mg by mouth daily with supper. 11/05/19  Yes Minus Breeding, MD  senna (SENOKOT) 8.6 MG TABS tablet Take 1 tablet by mouth daily as needed for mild constipation.   Yes [provider]  timolol (TIMOPTIC) 0.5 %  ophthalmic solution INSTILL ONE DROP IN [facility] EYE DAILY Patient taking differently: Place 1 drop into both eyes 2 (two) times daily. 09/08/20  Yes Rankin, Clent Demark, MD  vitamin C (ASCORBIC ACID) 500 MG tablet Take 500 mg by mouth daily.   Yes [provider]  bisacodyl (DULCOLAX) 5 MG EC tablet Take 1 tablet (5 mg total) by mouth daily as needed for moderate constipation. Patient not taking: No sig reported 12/26/18   Debbe Odea, MD    Physical Exam: Vitals:   10/14/20 1652 10/14/20 1700 10/14/20 1730 10/14/20 1850  BP: 139/72 140/62 (!) 135/55 (!) 132/94  Pulse: (!) 35 (!) 30 (!) 29 (!) 30  Resp: _0 Temp:      TempSrc:      SpO2: 100% 99% 98% 99%  Weight:      Height:        Physical Exam Constitutional:      General: He is not in acute distress. HENT:     Head: Normocephalic  and atraumatic.     Mouth/Throat:     Mouth: Mucous membranes are dry.  Eyes:     Extraocular Movements: Extraocular movements intact.     Conjunctiva/sclera: Conjunctivae normal.  Cardiovascular:     Rate and Rhythm: Normal rate and regular rhythm.     Pulses: Normal pulses.  Pulmonary:     Effort: Pulmonary effort is normal. No respiratory distress.     Breath sounds: No wheezing.  Abdominal:     General: Bowel sounds are normal. There is no distension.     Palpations: Abdomen is soft.     Tenderness: There is no abdominal tenderness.  Musculoskeletal:        General: No swelling or tenderness.     Cervical back: Normal range of motion and neck supple.  Skin:    General: Skin is warm and dry.  Neurological:     General: No focal deficit present.     Mental Status: He is alert and oriented to person, place, and time.     Labs on Admission: I have personally reviewed following labs and imaging studies  CBC: Recent Labs  Lab 10/14/20 1551  WBC 8.8  NEUTROABS 6.4  HGB 10.7*  HCT 33.5*  MCV 99.4  PLT 347   Basic Metabolic Panel: Recent Labs  Lab 10/14/20 1551  NA  136  K 4.1  CL 100  CO2 27  GLUCOSE 114*  BUN 70*  CREATININE 1.95*  CALCIUM 9.4   GFR: Estimated Creatinine Clearance: 26.5 mL/min (A) (by C-G formula based on SCr of 1.95 mg/dL (H)). Liver Function Tests: Recent Labs  Lab 10/14/20 1551  AST 18  ALT 11  ALKPHOS 59  BILITOT 0.9  PROT 7.0  ALBUMIN 3.6   No results for input(s): LIPASE, AMYLASE in the last 168 hours. No results for input(s): AMMONIA in the last 168 hours. Coagulation Profile: No results for input(s): INR, PROTIME in the last 168 hours. Cardiac Enzymes: No results for input(s): CKTOTAL, CKMB, CKMBINDEX, TROPONINI in the last 168 hours. BNP (last 3 results) No results for input(s): PROBNP in the last 8760 hours. HbA1C: No results for input(s): HGBA1C in the last 72 hours. CBG: No results for input(s): GLUCAP in the last 168 hours. Lipid Profile: No results for input(s): CHOL, HDL, LDLCALC, TRIG, CHOLHDL, LDLDIRECT in the last 72 hours. Thyroid Function Tests: No results for input(s): TSH, T4TOTAL, FREET4, T3FREE, THYROIDAB in the last 72 hours. Anemia Panel: No results for input(s): VITAMINB12, FOLATE, FERRITIN, TIBC, IRON, RETICCTPCT in the last 72 hours. Urine analysis:    Component Value Date/Time   COLORURINE YELLOW 10/14/2020 1500   APPEARANCEUR TURBID (A) 10/14/2020 1500   LABSPEC 1.018 10/14/2020 1500   PHURINE 6.0 10/14/2020 1500   GLUCOSEU NEGATIVE 10/14/2020 1500   HGBUR SMALL (A) 10/14/2020 1500   BILIRUBINUR NEGATIVE 10/14/2020 1500   BILIRUBINUR negative 01/24/2020 1749   BILIRUBINUR negative 04/01/2018 1148   KETONESUR NEGATIVE 10/14/2020 1500   PROTEINUR 100 (A) 10/14/2020 1500   UROBILINOGEN 0.2 01/24/2020 1749   UROBILINOGEN 1.0 04/27/2012 0019   NITRITE NEGATIVE 10/14/2020 1500   LEUKOCYTESUR LARGE (A) 10/14/2020 1500    Radiological Exams on Admission: No results found.  EKG: Independently reviewed.  Atrial fibrillation with complete AV block.  No significant change since  prior tracing.  Assessment/Plan Principal Problem:   UTI (urinary tract infection) Active Problems:   Complete heart block (HCC)   HTN (hypertension)   AKI (acute kidney injury) (HCC)   Bradycardia  UTI -Lactate borderline elevated but no fever, leukocytosis, tachycardia, or hypotension to suggest sepsis. -Continue ceftriaxone -Continue IV fluid hydration -Urine and blood cultures pending  AKI -Likely prerenal azotemia from dehydration. -IV fluid hydration -Hold home lisinopril and hydrochlorothiazide  Bradycardia/complete heart block -Heart rate in the 30s but patient is asymptomatic and blood pressure stable. -This is a chronic issue for several years and patient is seen by Dr. Percival Spanish.  Decision was made to not put in a pacemaker as he has remained asymptomatic with his bradycardia. -Telemetry monitoring -No AV nodal blocking agents  A. Fib -Continue Xarelto  Hypertension -Stable -Hold lisinopril and hydrochlorothiazide in the setting of AKI  Generalized weakness -PT/OT eval -Fall precautions  Parkinson's disease -Continue Sinemet  DVT prophylaxis: Xarelto Code Status: Patient wishes to be full code. Family Communication: No family available at this time. Disposition Plan: Status is: Observation  The patient remains OBS appropriate and will d/c before 2 midnights.  Dispo: The patient is from: Home              Anticipated d/c is to: Home              Patient currently is not medically stable to d/c.   Difficult to place patient No  Level of care: Level of care: Telemetry  The medical decision making on this patient was of high complexity and the patient is at high risk for clinical deterioration, therefore this is a level 3 visit.  Shela Leff MD [facility]  If 7PM-7AM, please contact night-coverage [email or web address]  10/14/2020, 9:41 PM

## 2020-10-15 ENCOUNTER — Ambulatory Visit: Payer: Medicare HMO | Admitting: Internal Medicine

## 2020-10-15 DIAGNOSIS — R001 Bradycardia, unspecified: Secondary | ICD-10-CM

## 2020-10-15 DIAGNOSIS — N179 Acute kidney failure, unspecified: Secondary | ICD-10-CM

## 2020-10-15 DIAGNOSIS — N390 Urinary tract infection, site not specified: Secondary | ICD-10-CM | POA: Diagnosis not present

## 2020-10-15 DIAGNOSIS — I10 Essential (primary) hypertension: Secondary | ICD-10-CM

## 2020-10-15 DIAGNOSIS — I442 Atrioventricular block, complete: Secondary | ICD-10-CM | POA: Diagnosis not present

## 2020-10-15 LAB — COMPREHENSIVE METABOLIC PANEL
ALT: <5 U/L (ref 0–44)
AST: 18 U/L (ref 15–41)
Albumin: 3.4 g/dL — ABNORMAL LOW (ref 3.5–5.0)
Alkaline Phosphatase: 52 U/L (ref 38–126)
Anion gap: 7 (ref 5–15)
BUN: 48 mg/dL — ABNORMAL HIGH (ref 8–23)
CO2: 24 mmol/L (ref 22–32)
Calcium: 9.1 mg/dL (ref 8.9–10.3)
Chloride: 108 mmol/L (ref 98–111)
Creatinine, Ser: 1.22 mg/dL (ref 0.61–1.24)
GFR, Estimated: 56 mL/min — ABNORMAL LOW (ref >60)
Glucose, Bld: 91 mg/dL (ref 70–99)
Potassium: 4.1 mmol/L (ref 3.5–5.1)
Sodium: 139 mmol/L (ref 135–145)
Total Bilirubin: 0.8 mg/dL (ref 0.3–1.2)
Total Protein: 6.7 g/dL (ref 6.5–8.1)

## 2020-10-15 LAB — CBC WITH DIFFERENTIAL/PLATELET
Abs Immature Granulocytes: 0.04 10*3/uL (ref 0.00–0.07)
Basophils Absolute: 0 10*3/uL (ref 0.0–0.1)
Basophils Relative: 0 %
Eosinophils Absolute: 0.2 10*3/uL (ref 0.0–0.5)
Eosinophils Relative: 3 %
HCT: 36.1 % — ABNORMAL LOW (ref 39.0–52.0)
Hemoglobin: 11.5 g/dL — ABNORMAL LOW (ref 13.0–17.0)
Immature Granulocytes: 1 %
Lymphocytes Relative: 24 %
Lymphs Abs: 1.7 10*3/uL (ref 0.7–4.0)
MCH: 32 pg (ref 26.0–34.0)
MCHC: 31.9 g/dL (ref 30.0–36.0)
MCV: 100.6 fL — ABNORMAL HIGH (ref 80.0–100.0)
Monocytes Absolute: 0.7 10*3/uL (ref 0.1–1.0)
Monocytes Relative: 11 %
Neutro Abs: 4.4 10*3/uL (ref 1.7–7.7)
Neutrophils Relative %: 61 %
Platelets: 202 10*3/uL (ref 150–400)
RBC: 3.59 MIL/uL — ABNORMAL LOW (ref 4.22–5.81)
RDW: 14.6 % (ref 11.5–15.5)
WBC: 7 10*3/uL (ref 4.0–10.5)
nRBC: 0 % (ref 0.0–0.2)

## 2020-10-15 LAB — MAGNESIUM: Magnesium: 2 mg/dL (ref 1.7–2.4)

## 2020-10-15 LAB — PHOSPHORUS: Phosphorus: 2.5 mg/dL (ref 2.5–4.6)

## 2020-10-15 MED ORDER — SODIUM CHLORIDE 0.9 % IV SOLN: INTRAVENOUS | Status: DC

## 2020-10-15 NOTE — Evaluation (Signed)
Occupational Therapy Evaluation Patient Details Name: Chad Russell MRN: [id number] DOB: [birth year] Today's Date: 10/15/2020    History of Present Illness patient is a 87 year old male who presented with weakness and poor oral intake. patient was found to have UTI. [id number] disease, bradycardia, a fib, HTN, OSA   Clinical Impression   Patient is a 87 year old male who was admitted for above. Patient was previously living at home with wife with assistance for LB dressing tasks. Currently patient is mod A to max A for ADLs with strong posterior leaning in standing.patient was noted to have decreased activity tolerance, decreased standing balance, decreased safety awareness, decreased memory, and increased need for physical assistance impacting patients ability to engage in ADLs.  Patient would continue to benefit from skilled OT services at this time while admitted and after d/c to address noted deficits in order to improve overall safety and independence in ADLs.      Follow Up Recommendations  SNF    Equipment Recommendations  3 in 1 bedside commode;Tub/shower seat    Recommendations for Other Services       Precautions / Restrictions Precautions Precautions: Fall Precaution Comments: strong posterior leaning, monitor HR Restrictions Weight Bearing Restrictions: No      Mobility Bed Mobility               General bed mobility comments: patient was already up in the recliner at start of session    Transfers Overall transfer level: Needs assistance Equipment used: Rolling walker (2 wheeled) Transfers: Sit to/from Stand Sit to Stand: Mod assist         General transfer comment: patient was educated on keeping toes on the ground and leaning trunk fowards ( nose over toes) to reduce posterior leaning. patient was noted to have no awareness of posterior leaning with patient noted to tkae off bilateral hands from walker with max A to maintain upright standing.  patient did not feel assist that was being provided. patient was noted to have improvement in posterior lean correction with bilateral feet shoulder width apart.    Balance Overall balance assessment: Needs assistance Sitting-balance support: Feet supported;No upper extremity supported                                       ADL either performed or assessed with clinical judgement   ADL Overall ADL's : Needs assistance/impaired Eating/Feeding: Set up;Sitting   Grooming: Oral care;Wash/dry face;Set up;Sitting   Upper Body Bathing: Moderate assistance;Sitting   Lower Body Bathing: Maximal assistance;Sitting/lateral leans   Upper Body Dressing : Moderate assistance;Sitting   Lower Body Dressing: Maximal assistance;Sitting/lateral leans   Toilet Transfer: RW;Ambulation;Moderate assistance;+2 for safety/equipment;Regular Glass blower/designer Details (indicate cue type and reason): patient was noted to have posterior lean when ambulating without consistent cues to keep BLE shoudler width apart. patient was mod A to transition from chair in room to toilet in bathroom with education for proper hand and foot placement for transfers. Toileting- Clothing Manipulation and Hygiene: Maximal assistance;Sit to/from stand Toileting - Clothing Manipulation Details (indicate cue type and reason): patient required max A for hygiene with education for maintaining standing balance.     Functional mobility during ADLs: Moderate assistance;+2 for physical assistance;+2 for safety/equipment;Cueing for safety;Cueing for sequencing;Rolling walker General ADL Comments: patient was noted to have strong lean to R side with transitioning from stand to sit on commode  and then again into recliner in room with mod A to maintan placement of bottom onto chair.     Vision   Additional Comments: patient was noted reported having glasses for reading. patient was noted to have difficult time identfying  smaller buttons on the call light. patient was able to find nurse button with one cue     Perception     Praxis      Pertinent Vitals/Pain Pain Assessment: No/denies pain     Hand Dominance Right   Extremity/Trunk Assessment Upper Extremity Assessment Upper Extremity Assessment: RUE deficits/detail RUE Deficits / Details: "old sports injury" unable to AROM flexion or abduction on RUE. able to use in functional tasks.   Lower Extremity Assessment Lower Extremity Assessment: Defer to PT evaluation   Cervical / Trunk Assessment Cervical / Trunk Assessment: Normal   Communication Communication Communication: HOH   Cognition Arousal/Alertness: Awake/alert Behavior During Therapy: WFL for tasks assessed/performed Overall Cognitive Status: No family/caregiver present to determine baseline cognitive functioning                                 General Comments: patient seemed to have poor awareness of where body was in chair asking to move over to R side more when body was against armrest. patient was also noted to have poor memory from earlier on this date.   General Comments  patient's HR ranged from 35- 43 bpm during activity during this session. patient reported dizziness with sitting down with blood pressure 134/67 mmhg.    Exercises     Shoulder Instructions      Home Living Family/patient expects to be discharged to:: Private residence Living Arrangements: Spouse/significant other Available Help at Discharge: Family;Available 24 hours/day Type of Home: House Home Access: Stairs to enter [employer] of Steps: 1 (8 in step) Entrance Stairs-Rails: None Home Layout: Able to live on main level with bedroom/bathroom;Two level Alternate Level Stairs-Number of Steps: flight   Bathroom Shower/Tub: Hospital doctor Toilet: Handicapped height     Home Equipment: Environmental consultant - 2 wheels;Cane - single point;Crutches   Additional Comments: patient  does not have a shower chair.      Prior Functioning/Environment Level of Independence: Needs assistance  Gait / Transfers Assistance Needed: using rollator per patient report ADL's / Homemaking Assistance Needed: wife assists with dressing lower body- socks shorts and shoes. patient reported being able to get to bathroom but needing assistance from wife for hygiene.patient reported being able to complete bathing tasks himself.            OT Problem List: Decreased coordination;Decreased strength;Decreased knowledge of use of DME or AE;Decreased activity tolerance;Impaired balance (sitting and/or standing);Decreased safety awareness      OT Treatment/Interventions: Self-care/ADL training;Therapeutic exercise;Energy conservation;DME and/or AE instruction;Therapeutic activities;Balance training;Patient/family education    OT Goals(Current goals can be found in the care plan section) Acute Rehab OT Goals Patient Stated Goal: to go to bathroom OT Goal Formulation: With patient Time For Goal Achievement: 10/29/20 Potential to Achieve Goals: Fair  OT Frequency: Min 2X/week   Barriers to D/C:    patient lives at home with wife who is not able to offer physical assistance       Co-evaluation              AM-PAC OT "6 Clicks" Daily Activity     Outcome Measure Help from another person eating meals?: A Little Help  from another person taking care of personal grooming?: A Little Help from another person toileting, which includes using toliet, bedpan, or urinal?: A Lot Help from another person bathing (including washing, rinsing, drying)?: A Lot Help from another person to put on and taking off regular upper body clothing?: A Lot Help from another person to put on and taking off regular lower body clothing?: A Lot 6 Click Score: 14   End of Session Equipment Utilized During Treatment: Gait belt;Rolling walker Nurse Communication: Other (comment) (nurse cleared patient to  participate)  Activity Tolerance: Patient tolerated treatment well Patient left: in chair;with call bell/phone within reach;with chair alarm set  OT Visit Diagnosis: Muscle weakness (generalized) (M62.81);Unsteadiness on feet (R26.81)                Time: [id number] OT Time Calculation (min): 34 min Charges:  OT General Charges $OT Visit: 1 Visit OT Evaluation $OT Eval Moderate Complexity: 1 Mod OT Treatments $Self Care/Home Management : 8-22 mins  Jackelyn Poling OTR/L, MS Acute Rehabilitation Department Office# [phone] Pager# [phone]   [place] 10/15/2020, 12:13 PM

## 2020-10-15 NOTE — Progress Notes (Signed)
PROGRESS NOTE    Chad Russell  [id number] DOB: 14-Apr-1926 DOA: 10/14/2020 PCP: [name], MD   Brief Narrative:  The patient is a 87 year old Caucasian elderly male with a past medical history significant for but not limited to Parkinson's disease on Sinemet, atrial fibrillation on anticoagulation, history of complete heart block for greater than 20 years with no indication of recurrent pacemaker, CAD, hypertension, OSA as well as other comorbidities who presented with a chief complaint of generalized weakness.  He states that he has been feeling weak over the last 5 days and he states is gotten weaker significantly and endorsed poor oral intake.  He denies any falls but does endorse urinary frequency and urgency.  Denies any dysuria and denies any nausea vomiting abdominal pain, chest pain or shortness of breath or any syncopal episodes.  He presented the ED for generalized weakness in the ED is found to be bradycardic with a heart rate in the 30s but not hypotensive.  Labs showed no leukocytosis.  UA showed a large amount of leukocytes and there are greater than 50 WBCs and many bacteria.  He is empirically started on antibiotics for a suspected UTI blood cultures and urine culture obtained.  He is currently being admitted and treated for UTI with IV ceftriaxone and PT OT evaluated and recommending SNF  Assessment & Plan:   Principal Problem:   UTI (urinary tract infection) Active Problems:   Complete heart block (HCC)   HTN (hypertension)   AKI (acute kidney injury) ([place])   Bradycardia  Acute UTI, present on admission -His lactate was elevated 2.1 and repeat was 2.1 but he had no fevers, leukocytosis, tachycardia or hypotension to suggest sepsis -Urinalysis showed a turbid appearance with small hemoglobin, large leukocytes, negative nitrites, many bacteria, 21-50 RBCs per high-power field, and greater than 50 WBCs -Urine culture pending -Blood cultures obtained and pending  but currently showed no growth to date less than 12 hours -Empirically started on IV ceftriaxone will continue -PT OT evaluated and recommending SNF given that he needs at least moderate assistance as well as given that he fatigues easily  AKI on CKD Stage 3a, present on admission -In the setting of prerenal azotemia likely from dehydration -Continue with IV fluid hydration as below -Patient's BUN/Cr went from 70/1.95 -> 48/1.22 -Currently holding his home lisinopril hydrochlorothiazide -Avoiding further nephrotoxic medications, contrast dyes, hypotension renally adjusted medications -Continue monitor and trend renal function carefully and repeat CMP in the a.m.  Bradycardia/complete heart block -He has had a heart rate in the 30s but he is asymptomatic and his blood pressure stable -This has been a chronic issue for several years and has been seen by Dr. Percival Spanish in outpatient setting and the decision was made not to put a pacemaker in as he remained asymptomatic with his -Continue with telemetry monitoring and continue to avoid AV nodal blocking agents  Chronic Atrial fibrillation -Continue with telemetry monitoring -CHA2DS2-VASc score 3 -Continue with anticoagulation with Xarelto  Hypertension -Currently stable -Holding his home hydrochlorothiazide and lisinopril in the setting of AKI -Continue monitor blood pressures per protocol -Last blood pressure reading was 135/66  Generalized weakness -In the setting of infection as above -Placed on fall precautions -PT OT evaluating and recommending SNF -Continue IV fluid hydration as above and received 2 L boluses; will start on back maintenance IV fluid hydration with normal saline at 75 MLS per hour for 12 hours as he is getting 125 MLS per hour for 12 hours  and that expired  Parkinson's Disease -Continue with Carbidopa-Levodopa 25-100 mg 1.5 tablets p.o. twice daily  Macrocytic Anemia -Patient's hemoglobin/hematocrit went from  10.7/33.5 and is now 11.5/36.1 -Check Anemia panel in the AM -Continue to monitor for signs and symptoms of bleeding; currently no overt bleeding noted -Repeat CBC in a.m.   DVT prophylaxis: Anticoagulated with Rivaroxaban 20 mg po Daily  Code Status: FULL CODE Family Communication: No family present at bedside  Disposition Plan: SNF   Status is: Observation  The patient will require care spanning > 2 midnights and should be moved to inpatient because: Unsafe d/c plan, IV treatments appropriate due to intensity of illness or inability to take PO, and Inpatient level of care appropriate due to severity of illness  Dispo: The patient is from: Home              Anticipated d/c is to: SNF              Patient currently is not medically stable to d/c.   Difficult to place patient No  Consultants:  None  Procedures: None  Antimicrobials:  Anti-infectives (From admission, onward)    Start     Dose/Rate Route Frequency Ordered Stop   10/15/20 1800  cefTRIAXone (ROCEPHIN) 1 g in sodium chloride 0.9 % 100 mL IVPB        1 g 200 mL/hr over 30 Minutes Intravenous Every 24 hours 10/14/20 2140     10/14/20 1715  cefTRIAXone (ROCEPHIN) 2 g in sodium chloride 0.9 % 100 mL IVPB  Status:  Discontinued        2 g 200 mL/hr over 30 Minutes Intravenous Every 24 hours 10/14/20 1706 10/14/20 2139        Subjective: Seen and examined at bedside he is still feeling fairly weak.  No chest pain or shortness of breath.  Denies any nausea or vomiting.  No other concerns or complaints at this time.  Objective: Vitals:   10/15/20 0217 10/15/20 0607 10/15/20 0911 10/15/20 1200  BP: (!) 163/69 (!) 164/60 (!) 174/75 135/66  Pulse: (!) 30 (!) 30 (!) 21 (!) 30  Resp: '17 16 18 17  '$ Temp: 97.6 F (36.4 C) 97.6 F (36.4 C) 98 F (36.7 C)   TempSrc: Oral Oral Oral Oral  SpO2: 100% 100% 95% 95%  Weight:      Height:        Intake/Output Summary (Last 24 hours) at 10/15/2020 1300 Last data filed at  10/15/2020 1028 Gross per 24 hour  Intake 2866.46 ml  Output 525 ml  Net 2341.46 ml   Filed Weights   10/14/20 1501  Weight: 81.6 kg   Examination: Physical Exam:  Constitutional: WN/WD elderly Caucasian male currently in NAD and appears calm but does appear fatigued Eyes: Lids and conjunctivae normal, sclerae anicteric  ENMT: External Ears, Nose appear normal. Grossly normal hearing.  Neck: Appears normal, supple, no cervical masses, normal ROM, no appreciable thyromegaly; no JVD Respiratory: Diminished to auscultation bilaterally, no wheezing, rales, rhonchi or crackles. Normal respiratory effort and patient is not tachypenic. No accessory muscle use.  Cardiovascular: RRR, as a 2 out of 6 systolic murmur Abdomen: Soft, non-tender, non-distended. Bowel sounds positive.  GU: Deferred.  Wearing a condom cath Musculoskeletal: No clubbing / cyanosis of digits/nails. No joint deformity upper and lower extremities. Skin: No rashes, lesions, ulcers on limited skin evaluation. No induration; Warm and dry.  Neurologic: CN 2-12 grossly intact with no focal deficits. Romberg sign and cerebellar  reflexes not assessed.  Psychiatric: Normal judgment and insight. Alert and oriented x 3. Normal mood and appropriate affect.   Data Reviewed: I have personally reviewed following labs and imaging studies  CBC: Recent Labs  Lab 10/14/20 1551 10/15/20 1002  WBC 8.8 7.0  NEUTROABS 6.4 4.4  HGB 10.7* 11.5*  HCT 33.5* 36.1*  MCV 99.4 100.6*  PLT 228 [id number]   Basic Metabolic Panel: Recent Labs  Lab 10/14/20 1551 10/15/20 1002  NA 136 139  K 4.1 4.1  CL 100 108  CO2 27 24  GLUCOSE 114* 91  BUN 70* 48*  CREATININE 1.95* 1.22  CALCIUM 9.4 9.1  MG  --  2.0  PHOS  --  2.5   GFR: Estimated Creatinine Clearance: 42.4 mL/min (by C-G formula based on SCr of 1.22 mg/dL). Liver Function Tests: Recent Labs  Lab 10/14/20 1551 10/15/20 1002  AST 18 18  ALT 11 <5  ALKPHOS 59 52  BILITOT 0.9 0.8   PROT 7.0 6.7  ALBUMIN 3.6 3.4*   No results for input(s): LIPASE, AMYLASE in the last 168 hours. No results for input(s): AMMONIA in the last 168 hours. Coagulation Profile: No results for input(s): INR, PROTIME in the last 168 hours. Cardiac Enzymes: No results for input(s): CKTOTAL, CKMB, CKMBINDEX, TROPONINI in the last 168 hours. BNP (last 3 results) No results for input(s): PROBNP in the last 8760 hours. HbA1C: No results for input(s): HGBA1C in the last 72 hours. CBG: No results for input(s): GLUCAP in the last 168 hours. Lipid Profile: No results for input(s): CHOL, HDL, LDLCALC, TRIG, CHOLHDL, LDLDIRECT in the last 72 hours. Thyroid Function Tests: No results for input(s): TSH, T4TOTAL, FREET4, T3FREE, THYROIDAB in the last 72 hours. Anemia Panel: No results for input(s): VITAMINB12, FOLATE, FERRITIN, TIBC, IRON, RETICCTPCT in the last 72 hours. Sepsis Labs: Recent Labs  Lab 10/14/20 1656 10/14/20 1950  LATICACIDVEN 2.1* 2.1*   Recent Results (from the past 240 hour(s))  Blood culture (routine single)     Status: None (Preliminary result)   Collection Time: 10/14/20  4:56 PM   Specimen: BLOOD  Result Value Ref Range Status   Specimen Description   Final    BLOOD BLOOD RIGHT FOREARM Performed at [facility] [address] [place], [place] [address]    Special Requests   Final    BOTTLES DRAWN AEROBIC AND ANAEROBIC Blood Culture adequate volume Performed at [facility] [address], [place], [place] [address]    Culture   Final    NO GROWTH < 12 HOURS Performed at [facility] [address] [place], [place] [address]    Report Status PENDING  Incomplete  Resp Panel by RT-PCR (Flu A&B, Covid) Nasopharyngeal Swab     Status: None   Collection Time: 10/14/20  5:04 PM   Specimen: Nasopharyngeal Swab; Nasopharyngeal(NP) swabs in vial transport medium  Result Value Ref Range Status   SARS Coronavirus 2 by RT PCR  NEGATIVE NEGATIVE Final    Comment: (NOTE) SARS-CoV-2 target nucleic acids are NOT DETECTED.  The SARS-CoV-2 RNA is generally detectable in upper respiratory specimens during the acute phase of infection. The lowest concentration of SARS-CoV-2 viral copies this assay can detect is 138 copies/mL. A negative result does not preclude SARS-Cov-2 infection and should not be used as the sole basis for treatment or other patient management decisions. A negative result may occur with  improper specimen collection/handling, submission of specimen other than nasopharyngeal  swab, presence of viral mutation(s) within the areas targeted by this assay, and inadequate number of viral copies(<138 copies/mL). A negative result must be combined with clinical observations, patient history, and epidemiological information. The expected result is Negative.  Fact Sheet for Patients:  EntrepreneurPulse.com.au  Fact Sheet for Healthcare Providers:  IncredibleEmployment.be  This test is no t yet approved or cleared by the [place] FDA and  has been authorized for detection and/or diagnosis of SARS-CoV-2 by FDA under an Emergency Use Authorization (EUA). This EUA will remain  in effect (meaning this test can be used) for the duration of the COVID-19 declaration under Section 564(b)(1) of the Act, 21 U.S.C.section 360bbb-3(b)(1), unless the authorization is terminated  or revoked sooner.       Influenza A by PCR NEGATIVE NEGATIVE Final   Influenza B by PCR NEGATIVE NEGATIVE Final    Comment: (NOTE) The Xpert Xpress SARS-CoV-2/FLU/RSV plus assay is intended as an aid in the diagnosis of influenza from Nasopharyngeal swab specimens and should not be used as a sole basis for treatment. Nasal washings and aspirates are unacceptable for Xpert Xpress SARS-CoV-2/FLU/RSV testing.  Fact Sheet for Patients: EntrepreneurPulse.com.au  Fact Sheet for  Healthcare Providers: IncredibleEmployment.be  This test is not yet approved or cleared by the [place] FDA and has been authorized for detection and/or diagnosis of SARS-CoV-2 by FDA under an Emergency Use Authorization (EUA). This EUA will remain in effect (meaning this test can be used) for the duration of the COVID-19 declaration under Section 564(b)(1) of the Act, 21 U.S.C. section 360bbb-3(b)(1), unless the authorization is terminated or revoked.  Performed at [facility], [place] [address] [place], [place] [address]   Culture, blood (single)     Status: None (Preliminary result)   Collection Time: 10/14/20  5:05 PM   Specimen: BLOOD  Result Value Ref Range Status   Specimen Description BLOOD SITE NOT SPECIFIED  Final   Special Requests   Final    BOTTLES DRAWN AEROBIC AND ANAEROBIC Blood Culture adequate volume   Culture   Final    NO GROWTH < 12 HOURS Performed at [facility], [place] [address], [place],  [address]    Report Status PENDING  Incomplete    RN Pressure Injury Documentation:     Estimated body mass index is 24.41 kg/m as calculated from the following:   Height as of this encounter: 6' (1.829 m).   Weight as of this encounter: 81.6 kg.  Malnutrition Type:   Malnutrition Characteristics:   Nutrition Interventions:     Radiology Studies: No results found.  Scheduled Meds:  carbidopa-levodopa  1.5 tablet Oral BID   rivaroxaban  20 mg Oral Q supper   Continuous Infusions:  cefTRIAXone (ROCEPHIN)  IV      LOS: 0 days   Kerney Elbe, DO [facility] PAGER is on AMION  If 7PM-7AM, please contact night-coverage [email or web address]

## 2020-10-15 NOTE — Evaluation (Signed)
Physical Therapy Evaluation Patient Details Name: Chad Russell MRN: [id number] DOB: 1926/04/28 Today's Date: 10/15/2020   History of Present Illness  Patient is a 87 year old male who presented with weakness and poor oral intake and found to have UTI. PMH: Parkinson's disease, A. fib on anticoagulation, complete heart block for > 20 years with no indication for pacemaker, CAD, hypertension, OSA  Clinical Impression  Pt admitted with above diagnosis. Pt currently with functional limitations due to the deficits listed below (see PT Problem List). Pt will benefit from skilled PT to increase their independence and safety with mobility to allow discharge to the venue listed below.   Pt assisted with sitting EOB and then standing.  Pt requiring at least mod assist so transferred to recliner for [place].  Pt also fatigued quickly.  Pt lives with spouse who would not likely be able to physically assist pt.  Recommend d/c to SNF for rehab at this time.     Follow Up Recommendations SNF    Equipment Recommendations  None recommended by PT    Recommendations for Other Services       Precautions / Restrictions Precautions Precautions: Fall Precaution Comments: baseline bradycardia per pt Restrictions Weight Bearing Restrictions: No      Mobility  Bed Mobility Overal bed mobility: Needs Assistance Bed Mobility: Supine to Sit     Supine to sit: Mod assist;HOB elevated     General bed mobility comments: pt initiated however required mod assist to complete, assist for trunk upright and scooting to EOB    Transfers Overall transfer level: Needs assistance Equipment used: Rolling walker (2 wheeled) Transfers: Sit to/from [facility] Sit to Stand: Mod assist Stand pivot transfers: Mod assist       General transfer comment: verbal cues for hand placement and weight shifting; required assist to rise and steady due to posterior lean upon standing, step by step cues for  small steps over to recliner  Ambulation/Gait                Stairs            Wheelchair Mobility    Modified Rankin (Stroke Patients Only)       Balance Overall balance assessment: Needs assistance Sitting-balance support: Feet supported;No upper extremity supported       Standing balance support: Bilateral upper extremity supported Standing balance-Leahy Scale: Zero Standing balance comment: reliant on UE support and external assist                             Pertinent Vitals/Pain Pain Assessment: No/denies pain    Home Living Family/patient expects to be discharged to:: Private residence Living Arrangements: Spouse/significant other Available Help at Discharge: Family;Available 24 hours/day Type of Home: House Home Access: Stairs to enter Entrance Stairs-Rails: None Entrance Stairs-Number of Steps: 1 (8 in step) Home Layout: Able to live on main level with bedroom/bathroom;Two level Home Equipment: Walker - 2 wheels;Cane - single point;Crutches Additional Comments: patient does not have a shower chair.    Prior Function Level of Independence: Needs assistance   Gait / Transfers Assistance Needed: using rollator per patient report  ADL's / Homemaking Assistance Needed: wife assists with dressing lower body- socks shorts and shoes. patient reported being able to get to bathroom but needing assistance from wife for hygiene.patient reported being able to complete bathing tasks himself.        Hand Dominance  Dominant Hand: Right    Extremity/Trunk Assessment   Upper Extremity Assessment Upper Extremity Assessment: RUE deficits/detail RUE Deficits / Details: "old sports injury" unable to AROM flexion or abduction on RUE. able to use in functional tasks.    Lower Extremity Assessment Lower Extremity Assessment: Generalized weakness    Cervical / Trunk Assessment Cervical / Trunk Assessment: Normal  Communication    Communication: HOH  Cognition Arousal/Alertness: Awake/alert Behavior During Therapy: WFL for tasks assessed/performed Overall Cognitive Status: No family/caregiver present to determine baseline cognitive functioning                                 General Comments: decreased memory observed, pt requesting instruction/cues      General Comments General comments (skin integrity, edema, etc.): HR 35-45 bpm during session, pt reports this is his baseline (RN also confirms and reports cardiology aware)    Exercises     Assessment/Plan    PT Assessment Patient needs continued PT services  PT Problem List Decreased balance;Decreased knowledge of use of DME;Decreased activity tolerance;Decreased strength;Decreased mobility       PT Treatment Interventions DME instruction;Gait training;Balance training;Therapeutic exercise;Functional mobility training;Therapeutic activities;Patient/family education    PT Goals (Current goals can be found in the Care Plan section)  Acute Rehab PT Goals Patient Stated Goal: to go to bathroom PT Goal Formulation: With patient Time For Goal Achievement: 10/23/20 Potential to Achieve Goals: Good    Frequency Min 3X/week   Barriers to discharge Decreased caregiver support      Co-evaluation               AM-PAC PT "6 Clicks" Mobility  Outcome Measure Help needed turning from your back to your side while in a flat bed without using bedrails?: A Little Help needed moving from lying on your back to sitting on the side of a flat bed without using bedrails?: A Lot Help needed moving to and from a bed to a chair (including a wheelchair)?: A Lot Help needed standing up from a chair using your arms (e.g., wheelchair or bedside chair)?: A Lot Help needed to walk in hospital room?: A Lot Help needed climbing 3-5 steps with a railing? : Total 6 Click Score: 12    End of Session Equipment Utilized During Treatment: Gait belt Activity  Tolerance: Patient tolerated treatment well Patient left: in chair;with call bell/phone within reach;with chair alarm set Nurse Communication: Mobility status PT Visit Diagnosis: Other abnormalities of gait and mobility (R26.89);Muscle weakness (generalized) (M62.81)    Time: [id number] PT Time Calculation (min) (ACUTE ONLY): 21 min   Charges:   PT Evaluation $PT Eval Low Complexity: 1 Low        Kati PT, DPT Acute Rehabilitation Services Pager: [phone] Office: [phone]   York Ram E 10/15/2020, 1:01 PM

## 2020-10-16 DIAGNOSIS — N390 Urinary tract infection, site not specified: Secondary | ICD-10-CM | POA: Diagnosis not present

## 2020-10-16 DIAGNOSIS — B957 Other staphylococcus as the cause of diseases classified elsewhere: Secondary | ICD-10-CM | POA: Diagnosis present

## 2020-10-16 DIAGNOSIS — I442 Atrioventricular block, complete: Secondary | ICD-10-CM | POA: Diagnosis not present

## 2020-10-16 DIAGNOSIS — E860 Dehydration: Secondary | ICD-10-CM | POA: Diagnosis present

## 2020-10-16 DIAGNOSIS — Z1629 Resistance to other single specified antibiotic: Secondary | ICD-10-CM | POA: Diagnosis present

## 2020-10-16 DIAGNOSIS — Z20822 Contact with and (suspected) exposure to covid-19: Secondary | ICD-10-CM | POA: Diagnosis present

## 2020-10-16 DIAGNOSIS — R001 Bradycardia, unspecified: Secondary | ICD-10-CM | POA: Diagnosis not present

## 2020-10-16 DIAGNOSIS — Z8249 Family history of ischemic heart disease and other diseases of the circulatory system: Secondary | ICD-10-CM | POA: Diagnosis not present

## 2020-10-16 DIAGNOSIS — G4733 Obstructive sleep apnea (adult) (pediatric): Secondary | ICD-10-CM | POA: Diagnosis present

## 2020-10-16 DIAGNOSIS — D539 Nutritional anemia, unspecified: Secondary | ICD-10-CM | POA: Diagnosis present

## 2020-10-16 DIAGNOSIS — D6859 Other primary thrombophilia: Secondary | ICD-10-CM | POA: Diagnosis present

## 2020-10-16 DIAGNOSIS — M1990 Unspecified osteoarthritis, unspecified site: Secondary | ICD-10-CM | POA: Diagnosis present

## 2020-10-16 DIAGNOSIS — E872 Acidosis: Secondary | ICD-10-CM | POA: Diagnosis present

## 2020-10-16 DIAGNOSIS — Z23 Encounter for immunization: Secondary | ICD-10-CM | POA: Diagnosis not present

## 2020-10-16 DIAGNOSIS — N1831 Chronic kidney disease, stage 3a: Secondary | ICD-10-CM | POA: Diagnosis present

## 2020-10-16 DIAGNOSIS — Z820 Family history of epilepsy and other diseases of the nervous system: Secondary | ICD-10-CM | POA: Diagnosis not present

## 2020-10-16 DIAGNOSIS — I482 Chronic atrial fibrillation, unspecified: Secondary | ICD-10-CM | POA: Diagnosis present

## 2020-10-16 DIAGNOSIS — I2510 Atherosclerotic heart disease of native coronary artery without angina pectoris: Secondary | ICD-10-CM | POA: Diagnosis present

## 2020-10-16 DIAGNOSIS — I10 Essential (primary) hypertension: Secondary | ICD-10-CM | POA: Diagnosis not present

## 2020-10-16 DIAGNOSIS — N179 Acute kidney failure, unspecified: Secondary | ICD-10-CM | POA: Diagnosis not present

## 2020-10-16 DIAGNOSIS — K219 Gastro-esophageal reflux disease without esophagitis: Secondary | ICD-10-CM | POA: Diagnosis present

## 2020-10-16 DIAGNOSIS — I129 Hypertensive chronic kidney disease with stage 1 through stage 4 chronic kidney disease, or unspecified chronic kidney disease: Secondary | ICD-10-CM | POA: Diagnosis present

## 2020-10-16 DIAGNOSIS — G20 Parkinson's disease: Secondary | ICD-10-CM | POA: Diagnosis present

## 2020-10-16 DIAGNOSIS — R112 Nausea with vomiting, unspecified: Secondary | ICD-10-CM | POA: Diagnosis not present

## 2020-10-16 LAB — COMPREHENSIVE METABOLIC PANEL
ALT: 6 U/L (ref 0–44)
AST: 18 U/L (ref 15–41)
Albumin: 3.1 g/dL — ABNORMAL LOW (ref 3.5–5.0)
Alkaline Phosphatase: 49 U/L (ref 38–126)
Anion gap: 4 — ABNORMAL LOW (ref 5–15)
BUN: 40 mg/dL — ABNORMAL HIGH (ref 8–23)
CO2: 26 mmol/L (ref 22–32)
Calcium: 8.9 mg/dL (ref 8.9–10.3)
Chloride: 110 mmol/L (ref 98–111)
Creatinine, Ser: 1.08 mg/dL (ref 0.61–1.24)
GFR, Estimated: >60 mL/min (ref >60)
Glucose, Bld: 101 mg/dL — ABNORMAL HIGH (ref 70–99)
Potassium: 4.6 mmol/L (ref 3.5–5.1)
Sodium: 140 mmol/L (ref 135–145)
Total Bilirubin: 0.8 mg/dL (ref 0.3–1.2)
Total Protein: 6.3 g/dL — ABNORMAL LOW (ref 6.5–8.1)

## 2020-10-16 LAB — CBC WITH DIFFERENTIAL/PLATELET
Abs Immature Granulocytes: 0.03 10*3/uL (ref 0.00–0.07)
Basophils Absolute: 0 10*3/uL (ref 0.0–0.1)
Basophils Relative: 1 %
Eosinophils Absolute: 0.2 10*3/uL (ref 0.0–0.5)
Eosinophils Relative: 3 %
HCT: 32 % — ABNORMAL LOW (ref 39.0–52.0)
Hemoglobin: 10.3 g/dL — ABNORMAL LOW (ref 13.0–17.0)
Immature Granulocytes: 1 %
Lymphocytes Relative: 24 %
Lymphs Abs: 1.6 10*3/uL (ref 0.7–4.0)
MCH: 32.2 pg (ref 26.0–34.0)
MCHC: 32.2 g/dL (ref 30.0–36.0)
MCV: 100 fL (ref 80.0–100.0)
Monocytes Absolute: 0.7 10*3/uL (ref 0.1–1.0)
Monocytes Relative: 11 %
Neutro Abs: 4 10*3/uL (ref 1.7–7.7)
Neutrophils Relative %: 60 %
Platelets: 182 10*3/uL (ref 150–400)
RBC: 3.2 MIL/uL — ABNORMAL LOW (ref 4.22–5.81)
RDW: 14.7 % (ref 11.5–15.5)
WBC: 6.5 10*3/uL (ref 4.0–10.5)
nRBC: 0 % (ref 0.0–0.2)

## 2020-10-16 LAB — MAGNESIUM: Magnesium: 2 mg/dL (ref 1.7–2.4)

## 2020-10-16 LAB — PHOSPHORUS: Phosphorus: 2.4 mg/dL — ABNORMAL LOW (ref 2.5–4.6)

## 2020-10-16 MED ORDER — SODIUM CHLORIDE 0.9 % IV SOLN: INTRAVENOUS | Status: AC

## 2020-10-16 MED ORDER — K PHOS MONO-SOD PHOS DI & MONO 155-852-130 MG PO TABS
500.0000 mg | ORAL_TABLET | Freq: Once | ORAL | Status: AC
  Administered 2020-10-16: 500 mg via ORAL
  Filled 2020-10-16: qty 2

## 2020-10-16 NOTE — Progress Notes (Signed)
MEWS trigger YELLOW, no acute changes. Pt at bedside eating dinner with wife. Denies pain or discomfort. SRP, RN

## 2020-10-16 NOTE — Progress Notes (Signed)
PROGRESS NOTE    Chad Russell  [id number] DOB: 02/18/1927 DOA: 10/14/2020 PCP: [name], MD   Brief Narrative:  The patient is a 87 year old Caucasian elderly male with a past medical history significant for but not limited to Parkinson's disease on Sinemet, atrial fibrillation on anticoagulation, history of complete heart block for greater than 20 years with no indication of recurrent pacemaker, CAD, hypertension, OSA as well as other comorbidities who presented with a chief complaint of generalized weakness.  He states that he has been feeling weak over the last 5 days and he states is gotten weaker significantly and endorsed poor oral intake.  He denies any falls but does endorse urinary frequency and urgency.  Denies any dysuria and denies any nausea vomiting abdominal pain, chest pain or shortness of breath or any syncopal episodes.  He presented the ED for generalized weakness in the ED is found to be bradycardic with a heart rate in the 30s but not hypotensive.  Labs showed no leukocytosis.  UA showed a large amount of leukocytes and there are greater than 50 WBCs and many bacteria.  He is empirically started on antibiotics for a suspected UTI blood cultures and urine culture obtained.  He is currently being admitted and treated for UTI with IV ceftriaxone and PT OT evaluated and recommending SNF  Assessment & Plan:   Principal Problem:   UTI (urinary tract infection) Active Problems:   Complete heart block (HCC)   HTN (hypertension)   AKI (acute kidney injury) ([place])   Bradycardia  Acute Staphylococcus Simulans UTI, present on admission -His lactate was elevated 2.1 and repeat was 2.1 but he had no fevers, leukocytosis, tachycardia or hypotension to suggest sepsis -Urinalysis showed a turbid appearance with small hemoglobin, large leukocytes, negative nitrites, many bacteria, 21-50 RBCs per high-power field, and greater than 50 WBCs -Urine culture growing Staphyloccous  Simulans with Sensitivities pending  -Blood cultures obtained and pending but currently showed no growth to date less than 12 hours -Empirically started on IV ceftriaxone will continue for now and may change to Cefdinir  -PT OT evaluated and recommending SNF given that he needs at least moderate assistance as well as given that he fatigues easily  AKI on CKD Stage 3a, present on admission -In the setting of prerenal azotemia likely from dehydration -Continue with IV fluid hydration as below and continue for 12 more hours  -Patient's BUN/Cr went from 70/1.95 -> 48/1.22 -> 40/1.08 -Currently holding his home lisinopril hydrochlorothiazide -Avoiding further nephrotoxic medications, contrast dyes, hypotension renally adjusted medications -Continue monitor and trend renal function carefully and repeat CMP in the a.m.  Bradycardia/complete heart block -He has had a heart rate in the 30s but he is asymptomatic and his blood pressure stable -This has been a chronic issue for several years and has been seen by Dr. Percival Spanish in outpatient setting and the decision was made not to put a pacemaker in as he remained asymptomatic with his -Continue with telemetry monitoring and continue to avoid AV nodal blocking agents  Chronic Atrial fibrillation -Continue with telemetry monitoring -CHA2DS2-VASc score 3 -Continue with anticoagulation with Xarelto  Hypertension -Currently stable -Holding his home hydrochlorothiazide and lisinopril in the setting of AKI -Continue monitor blood pressures per protocol -Last blood pressure reading was elevated to 186/72  Hypophosphatemia -Patient's Phos Level was 2.4 -Replete with po K Phos 500 mg x1 -Continue to Monitor and Replete as Necessary -Repeat Phos Level in the AM   Generalized Weakness -In the  setting of infection as above -Placed on fall precautions -PT OT evaluating and recommending SNF -Continue IV fluid hydration as above and received 2 L boluses;  Maintenance IVF with NS at 75 mL/hr to stop   Parkinson's Disease -Continue with Carbidopa-Levodopa 25-100 mg 1.5 tablets p.o. twice daily -C/w Delirium Precautions   Macrocytic Anemia -Patient's hemoglobin/hematocrit went from 10.7/33.5 and is now 11.5/36.1 -> 10.3/32.0 -Check Anemia panel in the AM -Continue to monitor for signs and symptoms of bleeding; currently no overt bleeding noted -Repeat CBC in a.m.  DVT prophylaxis: Anticoagulated with Rivaroxaban 20 mg po Daily  Code Status: FULL CODE Family Communication: No family present at bedside but spoke with Wife over the telephone Disposition Plan: SNF   Status is: Inpatient   The patient will require care spanning > 2 midnights and should be moved to inpatient because: Unsafe d/c plan, IV treatments appropriate due to intensity of illness or inability to take PO, and Inpatient level of care appropriate due to severity of illness  Dispo: The patient is from: Home              Anticipated d/c is to: SNF              Patient currently is not medically stable to d/c.   Difficult to place patient No  Consultants:  None  Procedures: None  Antimicrobials:  Anti-infectives (From admission, onward)    Start     Dose/Rate Route Frequency Ordered Stop   10/15/20 1800  cefTRIAXone (ROCEPHIN) 1 g in sodium chloride 0.9 % 100 mL IVPB        1 g 200 mL/hr over 30 Minutes Intravenous Every 24 hours 10/14/20 2140     10/14/20 1715  cefTRIAXone (ROCEPHIN) 2 g in sodium chloride 0.9 % 100 mL IVPB  Status:  Discontinued        2 g 200 mL/hr over 30 Minutes Intravenous Every 24 hours 10/14/20 1706 10/14/20 2139        Subjective: Seen and examined at bedside and he was wanting to rest.  Still states he is fairly weak but thinks he is improving.  No nausea or vomiting.  Denies any chest pain or lightheadedness or dizziness.  Wife thinks he is little confused this morning but he is appropriately when I saw him..  Objective: Vitals:    10/15/20 1200 10/15/20 2124 10/16/20 0000 10/16/20 0502  BP: 135/66 (!) 179/65 (!) 161/69 (!) 186/72  Pulse: (!) 30 (!) 32 (!) 30 67  Resp: '17 14  16  '$ Temp:  97.7 F (36.5 C)  98.4 F (36.9 C)  TempSrc: Oral Oral  Oral  SpO2: 95% 100%  100%  Weight:      Height:        Intake/Output Summary (Last 24 hours) at 10/16/2020 1320 Last data filed at 10/16/2020 1023 Gross per 24 hour  Intake 1571.2 ml  Output 800 ml  Net 771.2 ml    Filed Weights   10/14/20 1501  Weight: 81.6 kg   Examination: Physical Exam:  Constitutional: WN/WD elderly Caucasian male in NAD and appears calm and a little sleepy Eyes: Lids and conjunctivae normal, sclerae anicteric  ENMT: External Ears, Nose appear normal. Grossly normal hearing Neck: Appears normal, supple, no cervical masses, normal ROM, no appreciable thyromegaly; no JVD Respiratory: Diminished to auscultation bilaterally with coarse breath sounds, no wheezing, rales, rhonchi or crackles. Normal respiratory effort and patient is not tachypenic. No accessory muscle use. Unlabored breathing  Cardiovascular:  RRR, and has a 2/6 Systolic Murmur Abdomen: Soft, NT, ND. Bowel sounds positive.  GU: Deferred. Musculoskeletal: No clubbing / cyanosis of digits/nails. No joint deformity upper and lower extremities.  Skin: No rashes, lesions, ulcers on a limited skin evaluation. No induration; Warm and dry.  Neurologic: CN 2-12 grossly intact with no focal deficits. Romberg sign and cerebellar reflexes not assessed.  Psychiatric: Normal judgment and insight. A little sleepy and somnolent. Normal mood and appropriate affect.   Data Reviewed: I have personally reviewed following labs and imaging studies  CBC: Recent Labs  Lab 10/14/20 1551 10/15/20 1002 10/16/20 0329  WBC 8.8 7.0 6.5  NEUTROABS 6.4 4.4 4.0  HGB 10.7* 11.5* 10.3*  HCT 33.5* 36.1* 32.0*  MCV 99.4 100.6* 100.0  PLT 228 202 [id number]    Basic Metabolic Panel: Recent Labs  Lab  10/14/20 1551 10/15/20 1002 10/16/20 0329  NA 136 139 140  K 4.1 4.1 4.6  CL 100 108 110  CO2 '27 24 26  '$ GLUCOSE 114* 91 101*  BUN 70* 48* 40*  CREATININE 1.95* 1.22 1.08  CALCIUM 9.4 9.1 8.9  MG  --  2.0 2.0  PHOS  --  2.5 2.4*    GFR: Estimated Creatinine Clearance: 47.9 mL/min (by C-G formula based on SCr of 1.08 mg/dL). Liver Function Tests: Recent Labs  Lab 10/14/20 1551 10/15/20 1002 10/16/20 0329  AST '18 18 18  '$ ALT 11 <5 6  ALKPHOS 59 52 49  BILITOT 0.9 0.8 0.8  PROT 7.0 6.7 6.3*  ALBUMIN 3.6 3.4* 3.1*    No results for input(s): LIPASE, AMYLASE in the last 168 hours. No results for input(s): AMMONIA in the last 168 hours. Coagulation Profile: No results for input(s): INR, PROTIME in the last 168 hours. Cardiac Enzymes: No results for input(s): CKTOTAL, CKMB, CKMBINDEX, TROPONINI in the last 168 hours. BNP (last 3 results) No results for input(s): PROBNP in the last 8760 hours. HbA1C: No results for input(s): HGBA1C in the last 72 hours. CBG: No results for input(s): GLUCAP in the last 168 hours. Lipid Profile: No results for input(s): CHOL, HDL, LDLCALC, TRIG, CHOLHDL, LDLDIRECT in the last 72 hours. Thyroid Function Tests: No results for input(s): TSH, T4TOTAL, FREET4, T3FREE, THYROIDAB in the last 72 hours. Anemia Panel: No results for input(s): VITAMINB12, FOLATE, FERRITIN, TIBC, IRON, RETICCTPCT in the last 72 hours. Sepsis Labs: Recent Labs  Lab 10/14/20 1656 10/14/20 1950  LATICACIDVEN 2.1* 2.1*    Recent Results (from the past 240 hour(s))  Urine Culture     Status: Abnormal (Preliminary result)   Collection Time: 10/14/20  3:00 PM   Specimen: Urine, Clean Catch  Result Value Ref Range Status   Specimen Description   Final    URINE, CLEAN CATCH Performed at [facility], [place] [address], [place], [place] [address]    Special Requests   Final    NONE Performed at [facility], [place] [address],  [place], [place] [address]    Culture (A)  Final    >=100,000 COLONIES/mL STAPHYLOCOCCUS SIMULANS SUSCEPTIBILITIES TO FOLLOW Performed at [facility], [place] [address], [place], [place] [address]    Report Status PENDING  Incomplete  Blood culture (routine single)     Status: None (Preliminary result)   Collection Time: 10/14/20  4:56 PM   Specimen: BLOOD  Result Value Ref Range Status   Specimen Description   Final    BLOOD BLOOD RIGHT FOREARM Performed at [facility], [place] [address], [place], [place] [address]    Special Requests   Final    BOTTLES DRAWN AEROBIC AND ANAEROBIC Blood Culture adequate volume Performed at [place] [address] [place], [place] [address]    Culture   Final    NO GROWTH 2 DAYS Performed at [place] [address], [place], [place] [address]    Report Status PENDING  Incomplete  Resp Panel by RT-PCR (Flu A&B, Covid) Nasopharyngeal Swab     Status: None   Collection Time: 10/14/20  5:04 PM   Specimen: Nasopharyngeal Swab; Nasopharyngeal(NP) swabs in vial transport medium  Result Value Ref Range Status   SARS Coronavirus 2 by RT PCR NEGATIVE NEGATIVE Final    Comment: (NOTE) SARS-CoV-2 target nucleic acids are NOT DETECTED.  The SARS-CoV-2 RNA is generally detectable in upper respiratory specimens during the acute phase of infection. The lowest concentration of SARS-CoV-2 viral copies this assay can detect is 138 copies/mL. A negative result does not preclude SARS-Cov-2 infection and should not be used as the sole basis for treatment or other patient management decisions. A negative result may occur with  improper specimen collection/handling, submission of specimen other than nasopharyngeal swab, presence of viral mutation(s) within the areas targeted by this assay, and inadequate number of viral copies(<138 copies/mL). A negative result must be combined with clinical observations, patient  history, and epidemiological information. The expected result is Negative.  Fact Sheet for Patients:  EntrepreneurPulse.com.au  Fact Sheet for Healthcare Providers:  IncredibleEmployment.be  This test is no t yet approved or cleared by the [place] FDA and  has been authorized for detection and/or diagnosis of SARS-CoV-2 by FDA under an Emergency Use Authorization (EUA). This EUA will remain  in effect (meaning this test can be used) for the duration of the COVID-19 declaration under Section 564(b)(1) of the Act, 21 U.S.C.section 360bbb-3(b)(1), unless the authorization is terminated  or revoked sooner.       Influenza A by PCR NEGATIVE NEGATIVE Final   Influenza B by PCR NEGATIVE NEGATIVE Final    Comment: (NOTE) The Xpert Xpress SARS-CoV-2/FLU/RSV plus assay is intended as an aid in the diagnosis of influenza from Nasopharyngeal swab specimens and should not be used as a sole basis for treatment. Nasal washings and aspirates are unacceptable for Xpert Xpress SARS-CoV-2/FLU/RSV testing.  Fact Sheet for Patients: EntrepreneurPulse.com.au  Fact Sheet for Healthcare Providers: IncredibleEmployment.be  This test is not yet approved or cleared by the [place] FDA and has been authorized for detection and/or diagnosis of SARS-CoV-2 by FDA under an Emergency Use Authorization (EUA). This EUA will remain in effect (meaning this test can be used) for the duration of the COVID-19 declaration under Section 564(b)(1) of the Act, 21 U.S.C. section 360bbb-3(b)(1), unless the authorization is terminated or revoked.  Performed at [facility], [place] [address] [place], [place] [address]   Culture, blood (single)     Status: None (Preliminary result)   Collection Time: 10/14/20  5:05 PM   Specimen: BLOOD  Result Value Ref Range Status   Specimen Description BLOOD SITE NOT SPECIFIED   Final   Special Requests   Final    BOTTLES DRAWN AEROBIC AND ANAEROBIC Blood Culture adequate volume   Culture   Final    NO GROWTH 2 DAYS Performed at [facility], [address] [place], [place] [address]    Report Status PENDING  Incomplete  RN Pressure Injury Documentation:     Estimated body mass index is 24.41 kg/m as calculated from the following:   Height as of this encounter: 6' (1.829 m).   Weight as of this encounter: 81.6 kg.  Malnutrition Type:   Malnutrition Characteristics:   Nutrition Interventions:     Radiology Studies: No results found.  Scheduled Meds:  carbidopa-levodopa  1.5 tablet Oral BID   rivaroxaban  20 mg Oral Q supper   Continuous Infusions:  sodium chloride 75 mL/hr at 10/16/20 1021   cefTRIAXone (ROCEPHIN)  IV 1 g (10/15/20 1710)    LOS: 0 days   Kerney Elbe, DO [facility] PAGER is on [place]  If 7PM-7AM, please contact night-coverage [email or web address]

## 2020-10-17 LAB — COMPREHENSIVE METABOLIC PANEL
ALT: 12 U/L (ref 0–44)
AST: 17 U/L (ref 15–41)
Albumin: 3 g/dL — ABNORMAL LOW (ref 3.5–5.0)
Alkaline Phosphatase: 53 U/L (ref 38–126)
Anion gap: 6 (ref 5–15)
BUN: 32 mg/dL — ABNORMAL HIGH (ref 8–23)
CO2: 24 mmol/L (ref 22–32)
Calcium: 8.7 mg/dL — ABNORMAL LOW (ref 8.9–10.3)
Chloride: 110 mmol/L (ref 98–111)
Creatinine, Ser: 1.14 mg/dL (ref 0.61–1.24)
GFR, Estimated: >60 mL/min (ref >60)
Glucose, Bld: 116 mg/dL — ABNORMAL HIGH (ref 70–99)
Potassium: 4 mmol/L (ref 3.5–5.1)
Sodium: 140 mmol/L (ref 135–145)
Total Bilirubin: 0.7 mg/dL (ref 0.3–1.2)
Total Protein: 6.1 g/dL — ABNORMAL LOW (ref 6.5–8.1)

## 2020-10-17 LAB — CBC WITH DIFFERENTIAL/PLATELET
Abs Immature Granulocytes: 0.03 10*3/uL (ref 0.00–0.07)
Basophils Absolute: 0.1 10*3/uL (ref 0.0–0.1)
Basophils Relative: 1 %
Eosinophils Absolute: 0.2 10*3/uL (ref 0.0–0.5)
Eosinophils Relative: 2 %
HCT: 32.4 % — ABNORMAL LOW (ref 39.0–52.0)
Hemoglobin: 10.4 g/dL — ABNORMAL LOW (ref 13.0–17.0)
Immature Granulocytes: 0 %
Lymphocytes Relative: 38 %
Lymphs Abs: 3.4 10*3/uL (ref 0.7–4.0)
MCH: 31.8 pg (ref 26.0–34.0)
MCHC: 32.1 g/dL (ref 30.0–36.0)
MCV: 99.1 fL (ref 80.0–100.0)
Monocytes Absolute: 0.9 10*3/uL (ref 0.1–1.0)
Monocytes Relative: 10 %
Neutro Abs: 4.5 10*3/uL (ref 1.7–7.7)
Neutrophils Relative %: 49 %
Platelets: 199 10*3/uL (ref 150–400)
RBC: 3.27 MIL/uL — ABNORMAL LOW (ref 4.22–5.81)
RDW: 14.8 % (ref 11.5–15.5)
WBC: 9 10*3/uL (ref 4.0–10.5)
nRBC: 0 % (ref 0.0–0.2)

## 2020-10-17 LAB — IRON AND TIBC
Iron: 55 ug/dL (ref 45–182)
Saturation Ratios: 26 % (ref 17.9–39.5)
TIBC: 212 ug/dL — ABNORMAL LOW (ref 250–450)
UIBC: 157 ug/dL

## 2020-10-17 LAB — PHOSPHORUS: Phosphorus: 2.5 mg/dL (ref 2.5–4.6)

## 2020-10-17 LAB — FOLATE: Folate: 12.2 ng/mL (ref >5.9)

## 2020-10-17 LAB — RETICULOCYTES
Immature Retic Fract: 18.6 % — ABNORMAL HIGH (ref 2.3–15.9)
RBC.: 3.26 MIL/uL — ABNORMAL LOW (ref 4.22–5.81)
Retic Count, Absolute: 44.3 10*3/uL (ref 19.0–186.0)
Retic Ct Pct: 1.4 % (ref 0.4–3.1)

## 2020-10-17 LAB — MAGNESIUM: Magnesium: 1.9 mg/dL (ref 1.7–2.4)

## 2020-10-17 LAB — VITAMIN B12: Vitamin B-12: 746 pg/mL (ref 180–914)

## 2020-10-17 LAB — FERRITIN: Ferritin: 135 ng/mL (ref 24–336)

## 2020-10-17 MED ORDER — ONDANSETRON HCL 4 MG/2ML IJ SOLN: 4.0000 mg | Freq: Three times a day (TID) | INTRAMUSCULAR | Status: DC | PRN

## 2020-10-17 MED ORDER — BOOST / RESOURCE BREEZE PO LIQD CUSTOM
1.0000 | ORAL | Status: DC
  Administered 2020-10-18 – 2020-10-21 (×4): 1 via ORAL

## 2020-10-17 MED ORDER — ADULT MULTIVITAMIN W/MINERALS CH
1.0000 | ORAL_TABLET | Freq: Every day | ORAL | Status: DC
  Administered 2020-10-17 – 2020-10-21 (×5): 1 via ORAL
  Filled 2020-10-17 (×5): qty 1

## 2020-10-17 MED ORDER — ONDANSETRON HCL 4 MG/2ML IJ SOLN
4.0000 mg | Freq: Three times a day (TID) | INTRAMUSCULAR | Status: DC | PRN
Start: 2020-10-17 — End: 2020-10-17
  Administered 2020-10-17: 4 mg via INTRAVENOUS
  Filled 2020-10-17: qty 2

## 2020-10-17 MED ORDER — ENSURE ENLIVE PO LIQD
237.0000 mL | ORAL | Status: DC
  Administered 2020-10-17 – 2020-10-20 (×4): 237 mL via ORAL

## 2020-10-17 NOTE — Care Management Important Message (Signed)
Medicare IM printed for [employer] to give to the patient. 

## 2020-10-17 NOTE — Progress Notes (Signed)
PROGRESS NOTE    Chad Russell  [id number] DOB: 07/17/1926 DOA: 10/14/2020 PCP: [name], MD   Brief Narrative:  The patient is a 87 year old Caucasian elderly male with a past medical history significant for but not limited to Parkinson's disease on Sinemet, atrial fibrillation on anticoagulation, history of complete heart block for greater than 20 years with no indication of recurrent pacemaker, CAD, hypertension, OSA as well as other comorbidities who presented with a chief complaint of generalized weakness.  He states that he has been feeling weak over the last 5 days and he states is gotten weaker significantly and endorsed poor oral intake.  He denies any falls but does endorse urinary frequency and urgency.  Denies any dysuria and denies any nausea vomiting abdominal pain, chest pain or shortness of breath or any syncopal episodes.  He presented the ED for generalized weakness in the ED is found to be bradycardic with a heart rate in the 30s but not hypotensive.  Labs showed no leukocytosis.  UA showed a large amount of leukocytes and there are greater than 50 WBCs and many bacteria.  He is empirically started on antibiotics for a suspected UTI blood cultures and urine culture obtained.  He is currently being admitted and treated for UTI with IV ceftriaxone and PT OT evaluated and recommending SNF.  Overnight the patient became nauseous and started vomiting and this was was abated with Antiemetics.   Assessment & Plan:   Principal Problem:   UTI (urinary tract infection) Active Problems:   Complete heart block (HCC)   HTN (hypertension)   AKI (acute kidney injury) (HCC)   Bradycardia  Acute Staphylococcus Simulans UTI, present on admission -His lactate was elevated 2.1 and repeat was 2.1 but he had no fevers, leukocytosis, tachycardia or hypotension to suggest sepsis; WBC is now 9.0 -Urinalysis showed a turbid appearance with small hemoglobin, large leukocytes, negative  nitrites, many bacteria, 21-50 RBCs per high-power field, and greater than 50 WBCs -Urine culture growing Staphyloccous Simulans with Sensitivities pending  -Blood cultures obtained and pending but currently showed no growth to date less than 12 hours -Empirically started on IV ceftriaxone will continue for now and may change to Cefdinir but will await final Sensitivities  -PT OT evaluated and recommending SNF given that he needs at least moderate assistance as well as given that he fatigues easily  AKI on CKD Stage 3a, present on admission -In the setting of prerenal azotemia likely from dehydration -IVF hydration now stopped  -Patient's BUN/Cr went from 70/1.95 -> 48/1.22 -> 40/1.08 -> 32/1.14 -Currently holding his home Lisinopril and Hydrochlorothiazide -Avoiding further nephrotoxic medications, contrast dyes, hypotension renally adjusted medications -Continue monitor and trend renal function carefully and repeat CMP in the a.m.  Bradycardia/complete heart block -He has had a heart rate in the 30s but he is asymptomatic and his blood pressure stable -This has been a chronic issue for several years and has been seen by Dr. Percival Spanish in outpatient setting and the decision was made not to put a pacemaker in as he remained asymptomatic with his -Continue with telemetry monitoring and continue to avoid AV nodal blocking agents -HR ranging in the High 20's to Mid 30's when sleeping   Chronic Atrial fibrillation -Continue with telemetry monitoring -CHA2DS2-VASc score 3 -Continue with anticoagulation with Xarelto  Hypertension -Currently stable -Holding his home hydrochlorothiazide and lisinopril in the setting of AKI -Continue monitor blood pressures per protocol -Last blood pressure reading was elevated to 164/71  Hypophosphatemia -  Patient's Phos Level was 2.4 and improved to 2.5 -Replete with po K Phos 500 mg x1 yesterday  -Continue to Monitor and Replete as Necessary -Repeat Phos  Level in the AM   Nausea and Vomiting  -Vomited overnight -C/w Supportive Care -C/w Antiemetics with Ondansetron 4 mg IV q8hprn Nausea  Generalized Weakness -In the setting of infection as above -Placed on fall precautions -PT OT evaluating and recommending SNF -Continue IV fluid hydration as above and received 2 L boluses; Maintenance IVF now stopped   Parkinson's Disease -Continue with Carbidopa-Levodopa 25-100 mg 1.5 tablets p.o. twice daily -C/w Delirium Precautions   Macrocytic Anemia -Patient's hemoglobin/hematocrit went from 10.7/33.5 and is now 11.5/36.1 -> 10.3/32.0 -> 10.4/32.4 -Checked Anemia Panel and showed an iron level of 55, U IBC 157, TIBC of 212, saturation ratios of 26%, ferritin level of 135, folate level 12.2, and vitamin B12 level 746 -Continue to monitor for signs and symptoms of bleeding; currently no overt bleeding noted -Repeat CBC in a.m.  DVT prophylaxis: Anticoagulated with Rivaroxaban 20 mg po Daily  Code Status: FULL CODE Family Communication: No family present at bedside but spoke with Wife over the telephone Disposition Plan: SNF when bed is available uncontrolled authorization is obtained  Status is: Inpatient   The patient will require care spanning > 2 midnights and should be moved to inpatient because: Unsafe d/c plan, IV treatments appropriate due to intensity of illness or inability to take PO, and Inpatient level of care appropriate due to severity of illness  Dispo: The patient is from: Home              Anticipated d/c is to: SNF              Patient currently is not medically stable to d/c.   Difficult to place patient No  Consultants:  None  Procedures: None  Antimicrobials:  Anti-infectives (From admission, onward)    Start     Dose/Rate Route Frequency Ordered Stop   10/15/20 1800  cefTRIAXone (ROCEPHIN) 1 g in sodium chloride 0.9 % 100 mL IVPB        1 g 200 mL/hr over 30 Minutes Intravenous Every 24 hours 10/14/20 2140      10/14/20 1715  cefTRIAXone (ROCEPHIN) 2 g in sodium chloride 0.9 % 100 mL IVPB  Status:  Discontinued        2 g 200 mL/hr over 30 Minutes Intravenous Every 24 hours 10/14/20 1706 10/14/20 2139        Subjective: Seen and examined at bedside and was resting and awoken from sleep.  States that he started vomiting and was nauseous last night but states vomiting is resolved and he feels much better.  No chest pain or shortness of breath.  Is bradycardic but in no acute distress and asymptomatic.  No other concerns or complaints at this time and awaiting SNF placement for generalized weakness.  Objective: Vitals:   10/16/20 1800 10/16/20 2049 10/17/20 0409 10/17/20 0500  BP:  (!) 154/70 (!) 191/84 (!) 164/71  Pulse:  60 (!) 38   Resp: '16 17 17   '$ Temp:  97.8 F (36.6 C) 98.1 F (36.7 C)   TempSrc:  Oral Axillary   SpO2:  100% 96%   Weight:      Height:        Intake/Output Summary (Last 24 hours) at 10/17/2020 1204 Last data filed at 10/17/2020 0400 Gross per 24 hour  Intake 1105.48 ml  Output 400 ml  Net 705.48 ml    Filed Weights   10/14/20 1501  Weight: 81.6 kg   Examination: Physical Exam:  Constitutional: WN/WD elderly chronically ill appearing male in NAD and appears calm and comfortable resting and awoken from sleep Eyes: Lids and conjunctivae normal, sclerae anicteric  ENMT: External Ears, Nose appear normal. Grossly normal hearing. Neck: Appears normal, supple, no cervical masses, normal ROM, no appreciable thyromegaly; no JVD Respiratory: Diminished to auscultation bilaterally with coarse breath sounds, no wheezing, rales, rhonchi or crackles. Normal respiratory effort and patient is not tachypenic. No accessory muscle use. Unlabored breathing  Cardiovascular: RRR, has a 2/6 systolic murmur.  Abdomen: Soft, non-tender, non-distended. Bowel sounds positive.  GU: Deferred. Musculoskeletal: No clubbing / cyanosis of digits/nails. No joint deformity upper and lower  extremities Skin: No rashes, lesions, ulcers on a limited skin evaluation. No induration; Warm and dry.  Neurologic: CN 2-12 grossly intact with no focal deficits. Romberg sign and cerebellar reflexes not assessed.  Psychiatric: Normal judgment and insight. Somnolent and drowsy but easily arousable. Normal mood and appropriate affect.   Data Reviewed: I have personally reviewed following labs and imaging studies  CBC: Recent Labs  Lab 10/14/20 1551 10/15/20 1002 10/16/20 0329 10/17/20 0403  WBC 8.8 7.0 6.5 9.0  NEUTROABS 6.4 4.4 4.0 4.5  HGB 10.7* 11.5* 10.3* 10.4*  HCT 33.5* 36.1* 32.0* 32.4*  MCV 99.4 100.6* 100.0 99.1  PLT 228 202 182 [id number]    Basic Metabolic Panel: Recent Labs  Lab 10/14/20 1551 10/15/20 1002 10/16/20 0329 10/17/20 0403  NA 136 139 140 140  K 4.1 4.1 4.6 4.0  CL 100 108 110 110  CO2 '27 24 26 24  '$ GLUCOSE 114* 91 101* 116*  BUN 70* 48* 40* 32*  CREATININE 1.95* 1.22 1.08 1.14  CALCIUM 9.4 9.1 8.9 8.7*  MG  --  2.0 2.0 1.9  PHOS  --  2.5 2.4* 2.5    GFR: Estimated Creatinine Clearance: 45.4 mL/min (by C-G formula based on SCr of 1.14 mg/dL). Liver Function Tests: Recent Labs  Lab 10/14/20 1551 10/15/20 1002 10/16/20 0329 10/17/20 0403  AST '18 18 18 17  '$ ALT 11 '5 6 12  '$ ALKPHOS 59 52 49 53  BILITOT 0.9 0.8 0.8 0.7  PROT 7.0 6.7 6.3* 6.1*  ALBUMIN 3.6 3.4* 3.1* 3.0*    No results for input(s): LIPASE, AMYLASE in the last 168 hours. No results for input(s): AMMONIA in the last 168 hours. Coagulation Profile: No results for input(s): INR, PROTIME in the last 168 hours. Cardiac Enzymes: No results for input(s): CKTOTAL, CKMB, CKMBINDEX, TROPONINI in the last 168 hours. BNP (last 3 results) No results for input(s): PROBNP in the last 8760 hours. HbA1C: No results for input(s): HGBA1C in the last 72 hours. CBG: No results for input(s): GLUCAP in the last 168 hours. Lipid Profile: No results for input(s): CHOL, HDL, LDLCALC, TRIG, CHOLHDL,  LDLDIRECT in the last 72 hours. Thyroid Function Tests: No results for input(s): TSH, T4TOTAL, FREET4, T3FREE, THYROIDAB in the last 72 hours. Anemia Panel: Recent Labs    10/17/20 0403  VITAMINB12 746  FOLATE 12.2  FERRITIN 135  TIBC 212*  IRON 55  RETICCTPCT 1.4   Sepsis Labs: Recent Labs  Lab 10/14/20 1656 10/14/20 1950  LATICACIDVEN 2.1* 2.1*    Recent Results (from the past 240 hour(s))  Urine Culture     Status: Abnormal (Preliminary result)   Collection Time: 10/14/20  3:00 PM   Specimen: Urine, Clean Catch  Result Value Ref Range Status   Specimen Description   Final    URINE, CLEAN CATCH Performed at [facility], [place] [address] [place], [place] [address]    Special Requests   Final    NONE Performed at [facility], [place] [address], [place], [place] [address]    Culture (A)  Final    >=100,000 COLONIES/mL STAPHYLOCOCCUS SIMULANS SUSCEPTIBILITIES TO FOLLOW Performed at [facility], [place] [address], [place], [place] [address]    Report Status PENDING  Incomplete  Blood culture (routine single)     Status: None (Preliminary result)   Collection Time: 10/14/20  4:56 PM   Specimen: BLOOD  Result Value Ref Range Status   Specimen Description   Final    BLOOD BLOOD RIGHT FOREARM Performed at [place] [address] [place], [place] [address]    Special Requests   Final    BOTTLES DRAWN AEROBIC AND ANAEROBIC Blood Culture adequate volume Performed at [place] [address] [place], [place] [address]    Culture   Final    NO GROWTH 3 DAYS Performed at [facility], [place] [address], [place], [place] [address]    Report Status PENDING  Incomplete  Resp Panel by RT-PCR (Flu A&B, Covid) Nasopharyngeal Swab     Status: None   Collection Time: 10/14/20  5:04 PM   Specimen: Nasopharyngeal Swab; Nasopharyngeal(NP) swabs in vial transport medium  Result Value Ref  Range Status   SARS Coronavirus 2 by RT PCR NEGATIVE NEGATIVE Final    Comment: (NOTE) SARS-CoV-2 target nucleic acids are NOT DETECTED.  The SARS-CoV-2 RNA is generally detectable in upper respiratory specimens during the acute phase of infection. The lowest concentration of SARS-CoV-2 viral copies this assay can detect is 138 copies/mL. A negative result does not preclude SARS-Cov-2 infection and should not be used as the sole basis for treatment or other patient management decisions. A negative result may occur with  improper specimen collection/handling, submission of specimen other than nasopharyngeal swab, presence of viral mutation(s) within the areas targeted by this assay, and inadequate number of viral copies(<138 copies/mL). A negative result must be combined with clinical observations, patient history, and epidemiological information. The expected result is Negative.  Fact Sheet for Patients:  EntrepreneurPulse.com.au  Fact Sheet for Healthcare Providers:  IncredibleEmployment.be  This test is no t yet approved or cleared by the [place] FDA and  has been authorized for detection and/or diagnosis of SARS-CoV-2 by FDA under an Emergency Use Authorization (EUA). This EUA will remain  in effect (meaning this test can be used) for the duration of the COVID-19 declaration under Section 564(b)(1) of the Act, 21 U.S.C.section 360bbb-3(b)(1), unless the authorization is terminated  or revoked sooner.       Influenza A by PCR NEGATIVE NEGATIVE Final   Influenza B by PCR NEGATIVE NEGATIVE Final    Comment: (NOTE) The Xpert Xpress SARS-CoV-2/FLU/RSV plus assay is intended as an aid in the diagnosis of influenza from Nasopharyngeal swab specimens and should not be used as a sole basis for treatment. Nasal washings and aspirates are unacceptable for Xpert Xpress SARS-CoV-2/FLU/RSV testing.  Fact Sheet for  Patients: EntrepreneurPulse.com.au  Fact Sheet for Healthcare Providers: IncredibleEmployment.be  This test is not yet approved or cleared by the [place] FDA and has been authorized for detection and/or diagnosis of SARS-CoV-2 by FDA under an Emergency Use Authorization (EUA). This EUA will remain  in effect (meaning this test can be used) for the duration of the COVID-19 declaration under Section 564(b)(1) of the Act, 21 U.S.C. section 360bbb-3(b)(1), unless the authorization is terminated or revoked.  Performed at [facility], [place] [address] [place], [place] [address]   Culture, blood (single)     Status: None (Preliminary result)   Collection Time: 10/14/20  5:05 PM   Specimen: BLOOD  Result Value Ref Range Status   Specimen Description BLOOD SITE NOT SPECIFIED  Final   Special Requests   Final    BOTTLES DRAWN AEROBIC AND ANAEROBIC Blood Culture adequate volume   Culture   Final    NO GROWTH 3 DAYS Performed at [facility], [address], [place], [place] [address]    Report Status PENDING  Incomplete     RN Pressure Injury Documentation:     Estimated body mass index is 24.41 kg/m as calculated from the following:   Height as of this encounter: 6' (1.829 m).   Weight as of this encounter: 81.6 kg.  Malnutrition Type:   Malnutrition Characteristics:   Nutrition Interventions:     Radiology Studies: No results found.  Scheduled Meds:  carbidopa-levodopa  1.5 tablet Oral BID   rivaroxaban  20 mg Oral Q supper   Continuous Infusions:  cefTRIAXone (ROCEPHIN)  IV Stopped (10/16/20 1900)    LOS: 1 day   Kerney Elbe, DO [facility] PAGER is on [place]  If 7PM-7AM, please contact night-coverage [email or web address]

## 2020-10-17 NOTE — Progress Notes (Signed)
[place] notified by nursing assistant pt report of feeling nauseated.  Upon arrival to room, pt began to vomit. Moderate amount of gastric content. Suction set up and applied. Pt educated on how to use suction.  On call APP notified with request for antiemetic med. Zofran ordered and given. Pt went back to sleep. BP was elevated triggered a "yellow" MEWs. Pt doing better now that emesis episode has passed and is now sleeping comfortably.  Will continue to monitor and address pt needs

## 2020-10-17 NOTE — Progress Notes (Signed)
Occupational Therapy Treatment Patient Details Name: Chad Russell MRN: [id number] DOB: [birth year] Today's Date: 10/17/2020    History of present illness Patient is a 87 year old male who presented with weakness and poor oral intake and found to have UTI. PMH: Parkinson's disease, A. fib on anticoagulation, complete heart block for > 20 years with no indication for pacemaker, CAD, hypertension, OSA   OT comments  Treatment focused on functional mobility that is needed for ADLs. Patient performed 5 chair push up working on initiating sit to stand. Patient min assist to stand from recliner and ambulate approx 22 feet in room. Patient required verbal cues to lengthen step and widen stance. Patient tolerance well. Cont POC.   Follow Up Recommendations  SNF    Equipment Recommendations  3 in 1 bedside commode;Tub/shower seat    Recommendations for Other Services      Precautions / Restrictions Precautions Precautions: Fall Restrictions Weight Bearing Restrictions: No       Mobility Bed Mobility               General bed mobility comments: up in recliner    Transfers Overall transfer level: Needs assistance Equipment used: Rolling walker (2 wheeled) Transfers: Sit to/from Stand Sit to Stand: Min assist         General transfer comment: WOrked on sit to stand - performing chair push ups x 5 prior to standing. Min assist to stand from recliner wtih RW. Patient able to ambulate to bedroom door and then return to recliner with use of RW. Verbal cues to perform larger steps and widen stance.    Balance Overall balance assessment: Needs assistance Sitting-balance support: No upper extremity supported Sitting balance-Leahy Scale: Fair     Standing balance support: Bilateral upper extremity supported Standing balance-Leahy Scale: Poor                             ADL either performed or assessed with clinical judgement   ADL                                                Vision Patient Visual Report: No change from baseline     Perception     Praxis      Cognition Arousal/Alertness: Awake/alert Behavior During Therapy: WFL for tasks assessed/performed Overall Cognitive Status: Within Functional Limits for tasks assessed                                          Exercises     Shoulder Instructions       General Comments      Pertinent Vitals/ Pain       Pain Assessment: No/denies pain  Home Living                                          Prior Functioning/Environment              Frequency  Min 2X/week        Progress Toward Goals  OT Goals(current goals can now be found in the care plan section)  Progress  towards OT goals: Progressing toward goals  Acute Rehab OT Goals Patient Stated Goal: to go to bathroom OT Goal Formulation: With patient Time For Goal Achievement: 10/29/20 Potential to Achieve Goals: Good  Plan Discharge plan remains appropriate    Co-evaluation                 AM-PAC OT "6 Clicks" Daily Activity     Outcome Measure   Help from another person eating meals?: A Little Help from another person taking care of personal grooming?: A Little Help from another person toileting, which includes using toliet, bedpan, or urinal?: A Lot Help from another person bathing (including washing, rinsing, drying)?: A Lot Help from another person to put on and taking off regular upper body clothing?: A Lot Help from another person to put on and taking off regular lower body clothing?: A Lot 6 Click Score: 14    End of Session Equipment Utilized During Treatment: Gait belt;Rolling walker  OT Visit Diagnosis: Muscle weakness (generalized) (M62.81);Unsteadiness on feet (R26.81)   Activity Tolerance Patient tolerated treatment well   Patient Left in chair;with call bell/phone within reach;with chair alarm set;with family/visitor  present   Nurse Communication Mobility status        Time: [id number] OT Time Calculation (min): 18 min  Charges: OT General Charges $OT Visit: 1 Visit OT Treatments $Therapeutic Activity: 8-22 mins  Derl Barrow, OTR/L [place]  Office [phone] Pager: [place] 10/17/2020, 2:22 PM

## 2020-10-17 NOTE — Progress Notes (Signed)
Physical Therapy Treatment Patient Details Name: Chad Russell MRN: [id number] DOB: 1926/09/09 Today's Date: 10/17/2020    History of Present Illness Patient is a 87 year old male who presented with weakness and poor oral intake and found to have UTI. PMH: Parkinson's disease, A. fib on anticoagulation, complete heart block for > 20 years with no indication for pacemaker, CAD, hypertension, OSA    PT Comments    Pt sleeping on arrival however agreeable to mobilize.  Pt continues to require at least mod assist for transfers.  Pt would benefit from d/c to SNF for rehab.    Follow Up Recommendations  SNF     Equipment Recommendations  None recommended by PT    Recommendations for Other Services       Precautions / Restrictions Precautions Precautions: Fall Precaution Comments: baseline bradycardia per pt Restrictions Weight Bearing Restrictions: No    Mobility  Bed Mobility Overal bed mobility: Needs Assistance Bed Mobility: Supine to Sit     Supine to sit: Mod assist;HOB elevated     General bed mobility comments: assist for upper body    Transfers Overall transfer level: Needs assistance Equipment used: Rolling walker (2 wheeled) Transfers: Sit to/from [facility] Sit to Stand: Mod assist Stand pivot transfers: Min assist       General transfer comment: verbal cues for hand placement and weight shifting, assist to rise and steady, difficulty shifting weight to advance LEs  Ambulation/Gait                 Stairs             Wheelchair Mobility    Modified Rankin (Stroke Patients Only)       Balance Overall balance assessment: Needs assistance Sitting-balance support: No upper extremity supported Sitting balance-Leahy Scale: Fair     Standing balance support: Bilateral upper extremity supported Standing balance-Leahy Scale: Poor                              Cognition Arousal/Alertness:  Awake/alert Behavior During Therapy: WFL for tasks assessed/performed Overall Cognitive Status: Within Functional Limits for tasks assessed                                        Exercises      General Comments        Pertinent Vitals/Pain Pain Assessment: No/denies pain    Home Living                      Prior Function            PT Goals (current goals can now be found in the care plan section) Acute Rehab PT Goals Patient Stated Goal: to go to bathroom Progress towards PT goals: Progressing toward goals    Frequency    Min 3X/week      PT Plan Current plan remains appropriate    Co-evaluation              AM-PAC PT "6 Clicks" Mobility   Outcome Measure  Help needed turning from your back to your side while in a flat bed without using bedrails?: A Little Help needed moving from lying on your back to sitting on the side of a flat bed without using bedrails?: A Lot Help needed moving to and from  a bed to a chair (including a wheelchair)?: A Lot Help needed standing up from a chair using your arms (Russell.g., wheelchair or bedside chair)?: A Lot Help needed to walk in hospital room?: A Lot Help needed climbing 3-5 steps with a railing? : Total 6 Click Score: 12    End of Session Equipment Utilized During Treatment: Gait belt Activity Tolerance: Patient tolerated treatment well Patient left: in chair;with call bell/phone within reach;with chair alarm set Nurse Communication: Mobility status PT Visit Diagnosis: Other abnormalities of gait and mobility (R26.89);Muscle weakness (generalized) (M62.81)     Time: [id number] PT Time Calculation (min) (ACUTE ONLY): 11 min  Charges:  $Therapeutic Activity: 8-22 mins                     Jannette Spanner PT, DPT Acute Rehabilitation Services Pager: [phone] Office: [phone]    Chad Russell 10/17/2020, 3:07 PM

## 2020-10-17 NOTE — Progress Notes (Signed)
Initial Nutrition Assessment  DOCUMENTATION CODES:   Not applicable  INTERVENTION:  - will order Boost Breeze once/day, each supplement provides 250 kcal and 9 grams of protein. - will order Ensure Plus once/day, each supplement provides 350 kcal and 13 grams of protein. - will order Magic Cup with lunch meals, each supplement provides 290 kcal and 9 grams of protein. - will order 1 tablet multivitamin with minerals/day. - complete NFPE when feasible.    NUTRITION DIAGNOSIS:   Increased nutrient needs related to acute illness as evidenced by estimated needs.  GOAL:   Patient will meet greater than or equal to 90% of their needs  MONITOR:   PO intake, Supplement acceptance, Labs, Weight trends  REASON FOR ASSESSMENT:   Malnutrition Screening Tool  ASSESSMENT:   87 year old male with medical history of Parkinson's disease on Sinemet, afib on anticoagulation, complete heart block for >20 years with no indication of recurrent pacemaker, CAD, HTN, and OSA. He presented to the ED due to generalized weakness for 5 days and decreased oral intake.  Patient is noted to be a/o to self only and was resting in the chair at the time of attempted visit.  Documented meal completion percentages since admission: 50% of breakfast on 8/24; 30% of breakfast, 80% of lunch, 100% of dinner on 8/25.  He has not been seen by a [place] RD since 12/2018.  Weight on 8/23 was documented as 180 lb, which appears to be a stated weight. Weight had been stable (170-175 lb) from 10/18/18-06/15/20. No weight recording between 4/24 and 8/23.  Per notes: - N/V with last episode of emesis overnight  - UTI - AKI on stage 3 CKD - PT and OT recommending SNF at d/c   Labs reviewed; BUN: 32 mg/dl, Ca: 8.7 mg/dl. Medications reviewed.      NUTRITION - FOCUSED PHYSICAL EXAM:  Unable to complete at this time.   Diet Order:   Diet Order             Diet Heart Room service appropriate? Yes; Fluid  consistency: Thin  Diet effective now                   EDUCATION NEEDS:   No education needs have been identified at this time  Skin:  Skin Assessment: Reviewed RN Assessment  Last BM:  PTA/unknown  Height:   Ht Readings from Last 1 Encounters:  10/14/20 6' (1.829 m)    Weight:   Wt Readings from Last 1 Encounters:  10/14/20 81.6 kg     Estimated Nutritional Needs:  Kcal:  1650-1850 kcal Protein:  75-85 grams Fluid:  >/= 1.8 L/day      Jarome Matin, MS, RD, LDN, CNSC Inpatient Clinical Dietitian RD pager # available in AMION  After hours/weekend pager # available in [facility]

## 2020-10-18 LAB — CBC WITH DIFFERENTIAL/PLATELET
Abs Immature Granulocytes: 0.03 10*3/uL (ref 0.00–0.07)
Basophils Absolute: 0 10*3/uL (ref 0.0–0.1)
Basophils Relative: 1 %
Eosinophils Absolute: 0.2 10*3/uL (ref 0.0–0.5)
Eosinophils Relative: 3 %
HCT: 30 % — ABNORMAL LOW (ref 39.0–52.0)
Hemoglobin: 9.8 g/dL — ABNORMAL LOW (ref 13.0–17.0)
Immature Granulocytes: 1 %
Lymphocytes Relative: 25 %
Lymphs Abs: 1.6 10*3/uL (ref 0.7–4.0)
MCH: 32.2 pg (ref 26.0–34.0)
MCHC: 32.7 g/dL (ref 30.0–36.0)
MCV: 98.7 fL (ref 80.0–100.0)
Monocytes Absolute: 0.8 10*3/uL (ref 0.1–1.0)
Monocytes Relative: 12 %
Neutro Abs: 3.8 10*3/uL (ref 1.7–7.7)
Neutrophils Relative %: 58 %
Platelets: 196 10*3/uL (ref 150–400)
RBC: 3.04 MIL/uL — ABNORMAL LOW (ref 4.22–5.81)
RDW: 14.8 % (ref 11.5–15.5)
WBC: 6.4 10*3/uL (ref 4.0–10.5)
nRBC: 0 % (ref 0.0–0.2)

## 2020-10-18 LAB — COMPREHENSIVE METABOLIC PANEL
ALT: <5 U/L (ref 0–44)
AST: 16 U/L (ref 15–41)
Albumin: 2.8 g/dL — ABNORMAL LOW (ref 3.5–5.0)
Alkaline Phosphatase: 45 U/L (ref 38–126)
Anion gap: 5 (ref 5–15)
BUN: 31 mg/dL — ABNORMAL HIGH (ref 8–23)
CO2: 25 mmol/L (ref 22–32)
Calcium: 8.7 mg/dL — ABNORMAL LOW (ref 8.9–10.3)
Chloride: 108 mmol/L (ref 98–111)
Creatinine, Ser: 0.96 mg/dL (ref 0.61–1.24)
GFR, Estimated: >60 mL/min (ref >60)
Glucose, Bld: 87 mg/dL (ref 70–99)
Potassium: 4 mmol/L (ref 3.5–5.1)
Sodium: 138 mmol/L (ref 135–145)
Total Bilirubin: 0.6 mg/dL (ref 0.3–1.2)
Total Protein: 5.5 g/dL — ABNORMAL LOW (ref 6.5–8.1)

## 2020-10-18 LAB — PHOSPHORUS: Phosphorus: 2.2 mg/dL — ABNORMAL LOW (ref 2.5–4.6)

## 2020-10-18 LAB — MAGNESIUM: Magnesium: 2 mg/dL (ref 1.7–2.4)

## 2020-10-18 LAB — URINE CULTURE: Culture: >=100000 — AB

## 2020-10-18 MED ORDER — CEPHALEXIN 500 MG PO CAPS
500.0000 mg | ORAL_CAPSULE | Freq: Three times a day (TID) | ORAL | Status: AC
  Administered 2020-10-18 – 2020-10-21 (×9): 500 mg via ORAL
  Filled 2020-10-18 (×9): qty 1

## 2020-10-18 MED ORDER — K PHOS MONO-SOD PHOS DI & MONO 155-852-130 MG PO TABS
500.0000 mg | ORAL_TABLET | Freq: Once | ORAL | Status: AC
  Administered 2020-10-18: 500 mg via ORAL
  Filled 2020-10-18: qty 2

## 2020-10-18 MED ORDER — CALCIUM CARBONATE ANTACID 500 MG PO CHEW
1.0000 | CHEWABLE_TABLET | Freq: Two times a day (BID) | ORAL | Status: DC
  Administered 2020-10-18 – 2020-10-21 (×6): 200 mg via ORAL
  Filled 2020-10-18 (×6): qty 1

## 2020-10-18 NOTE — Progress Notes (Signed)
PROGRESS NOTE    Chad Russell  [id number] DOB: 10/18/1926 DOA: 10/14/2020 PCP: [name], MD   Brief Narrative:  The patient is a 87 year old Caucasian elderly male with a past medical history significant for but not limited to Parkinson's disease on Sinemet, atrial fibrillation on anticoagulation, history of complete heart block for greater than 20 years with no indication of recurrent pacemaker, CAD, hypertension, OSA as well as other comorbidities who presented with a chief complaint of generalized weakness.  He states that he has been feeling weak over the last 5 days and he states is gotten weaker significantly and endorsed poor oral intake.  He denies any falls but does endorse urinary frequency and urgency.  Denies any dysuria and denies any nausea vomiting abdominal pain, chest pain or shortness of breath or any syncopal episodes.  He presented the ED for generalized weakness in the ED is found to be bradycardic with a heart rate in the 30s but not hypotensive.  Labs showed no leukocytosis.  UA showed a large amount of leukocytes and there are greater than 50 WBCs and many bacteria.  He is empirically started on antibiotics for a suspected UTI blood cultures and urine culture obtained.  He is currently being admitted and treated for UTI with IV ceftriaxone and PT OT evaluated and recommending SNF.  The night before last the patient became nauseous and started vomiting and this was was abated with Antiemetics.  Sensitivities are now back for Staphylococcus simulans and case was discussed with infectious diseases Dr. Linus Salmons who recommends transitioning the patient to p.o. Keflex 500 mg 3 times daily  Assessment & Plan:   Principal Problem:   UTI (urinary tract infection) Active Problems:   Complete heart block (HCC)   HTN (hypertension)   AKI (acute kidney injury) (HCC)   Bradycardia  Acute Staphylococcus Simulans UTI, present on admission -His lactate was elevated 2.1 and  repeat was 2.1 but he had no fevers, leukocytosis, tachycardia or hypotension to suggest sepsis; WBC is now 9.0 -Urinalysis showed a turbid appearance with small hemoglobin, large leukocytes, negative nitrites, many bacteria, 21-50 RBCs per high-power field, and greater than 50 WBCs -Urine culture growing Staphyloccous Simulans with Sensitivities Showing only resistance to Clindamycin   -Blood cultures obtained and showed NGTD at 3 Days  -Empirically started on IV ceftriaxone this was changed to p.o. Keflex 500 mg po TID after discussion with Infectious Disease and after sensitivities have -PT OT evaluated and recommending SNF given that he needs at least moderate assistance as well as given that he fatigues easily  AKI on CKD Stage 3a, present on admission -In the setting of prerenal azotemia likely from dehydration -IVF hydration now stopped  -Patient's BUN/Cr went from 70/1.95 -> 48/1.22 -> 40/1.08 -> 32/1.14 -> 31/0.96 -Currently holding his home Lisinopril and Hydrochlorothiazide -Avoiding further nephrotoxic medications, contrast dyes, hypotension renally adjusted medications -Continue monitor and trend renal function carefully and repeat CMP in the a.m.  Bradycardia/complete heart block -He has had a heart rate in the 30s but he is asymptomatic and his blood pressure stable -This has been a chronic issue for several years and has been seen by Dr. Percival Spanish in outpatient setting and the decision was made not to put a pacemaker in as he remained asymptomatic with his -Continue with telemetry monitoring and continue to avoid AV nodal blocking agents -HR ranging in the High 20's to Mid 30's when sleeping; HR in the 30's   Chronic Atrial fibrillation -Continue with  telemetry monitoring -CHA2DS2-VASc score 3 -Continue with anticoagulation with Xarelto  Hypertension -Currently stable -Holding his home hydrochlorothiazide and lisinopril in the setting of AKI -Continue monitor blood  pressures per protocol -Last blood pressure reading was elevated to 118/64  Hypophosphatemia -Patient's Phos Level was 2.2 -Replete with po K Phos 500 mg  -Continue to Monitor and Replete as Necessary -Repeat Phos Level in the AM   Nausea and Vomiting  -Vomited the night before last but not last night  -C/w Supportive Care -C/w Antiemetics with Ondansetron 4 mg IV q8hprn Nausea  Generalized Weakness -In the setting of infection as above -Placed on fall precautions -PT OT evaluating and recommending SNF -Continue IV fluid hydration as above and received 2 L boluses; Maintenance IVF now stopped   Parkinson's Disease -Continue with Carbidopa-Levodopa 25-100 mg 1.5 tablets p.o. twice daily -C/w Delirium Precautions   Macrocytic Anemia -Patient's hemoglobin/hematocrit went from 10.7/33.5 and is now 11.5/36.1 -> 10.3/32.0 -> 10.4/32.4 -> 9.8/30.0 -Checked Anemia Panel and showed an iron level of 55, U IBC 157, TIBC of 212, saturation ratios of 26%, ferritin level of 135, folate level 12.2, and vitamin B12 level 746 -Continue to monitor for signs and symptoms of bleeding; currently no overt bleeding noted -Repeat CBC in a.m.  DVT prophylaxis: Anticoagulated with Rivaroxaban 20 mg po Daily  Code Status: FULL CODE Family Communication: No family present at bedside Disposition Plan: SNF when bed is available uncontrolled authorization is obtained  Status is: Inpatient   The patient will require care spanning > 2 midnights and should be moved to inpatient because: Unsafe d/c plan, IV treatments appropriate due to intensity of illness or inability to take PO, and Inpatient level of care appropriate due to severity of illness  Dispo: The patient is from: Home              Anticipated d/c is to: SNF              Patient currently is not medically stable to d/c.   Difficult to place patient No  Consultants:  None  Procedures: None  Antimicrobials:  Anti-infectives (From admission,  onward)    Start     Dose/Rate Route Frequency Ordered Stop   10/18/20 1400  cephALEXin (KEFLEX) capsule 500 mg        500 mg Oral Every 8 hours 10/18/20 1232 10/21/20 1359   10/15/20 1800  cefTRIAXone (ROCEPHIN) 1 g in sodium chloride 0.9 % 100 mL IVPB  Status:  Discontinued        1 g 200 mL/hr over 30 Minutes Intravenous Every 24 hours 10/14/20 2140 10/18/20 1232   10/14/20 1715  cefTRIAXone (ROCEPHIN) 2 g in sodium chloride 0.9 % 100 mL IVPB  Status:  Discontinued        2 g 200 mL/hr over 30 Minutes Intravenous Every 24 hours 10/14/20 1706 10/14/20 2139        Subjective: Seen and examined at bedside and he was resting and in no acute distress.  Felt well and states that he had a pleasant night.  No nausea or vomiting this time.  Denies any lightheadedness or dizziness.  No other concerns or complaints at this time.  Objective: Vitals:   10/18/20 0456 10/18/20 0538 10/18/20 1117 10/18/20 1203  BP: 134/66 (!) 143/67 130/64 118/64  Pulse: 61 (!) 30 (!) 34 (!) 33  Resp: '15 13 16   '$ Temp: 97.6 F (36.4 C)  98.6 F (37 C) 98.5 F (36.9 C)  TempSrc:  Oral  Axillary Oral  SpO2:  98%  94%  Weight:      Height:        Intake/Output Summary (Last 24 hours) at 10/18/2020 1233 Last data filed at 10/18/2020 0900 Gross per 24 hour  Intake 360 ml  Output 670 ml  Net -310 ml    Filed Weights   10/14/20 1501  Weight: 81.6 kg   Examination: Physical Exam:  Constitutional: WN/WD elderly chronically ill-appearing male in no acute distress appears calm and comfortable and resting fairly well  Eyes: Lids and conjunctivae normal, sclerae anicteric  ENMT: External Ears, Nose appear normal. Grossly normal hearing. Mucous membranes are moist. Neck: Appears normal, supple, no cervical masses, normal ROM, no appreciable thyromegaly; no JVD Respiratory: Diminished to auscultation bilaterally with coarse breath sounds, no wheezing, rales, rhonchi or crackles. Normal respiratory effort and  patient is not tachypenic. No accessory muscle use. Unlabored breathing Cardiovascular: Bradycardic rate but regular rhythm, 2/6 systolic murmur and no lower extremity edema noted  Abdomen: Soft, non-tender, non-distended. Bowel sounds positive.  GU: Deferred.  He has a condom cath on Musculoskeletal: No clubbing / cyanosis of digits/nails. No joint deformity upper and lower extremities.  Skin: No rashes, lesions, ulcers on limited skin evaluation. No induration; Warm and dry.  Neurologic: CN 2-12 grossly intact with no focal deficits. Romberg sign cerebellar reflexes not assessed.  Psychiatric: Normal judgment and insight.  He is sleepy but easily arousable. Normal mood and appropriate affect.   Data Reviewed: I have personally reviewed following labs and imaging studies  CBC: Recent Labs  Lab 10/14/20 1551 10/15/20 1002 10/16/20 0329 10/17/20 0403 10/18/20 0431  WBC 8.8 7.0 6.5 9.0 6.4  NEUTROABS 6.4 4.4 4.0 4.5 3.8  HGB 10.7* 11.5* 10.3* 10.4* 9.8*  HCT 33.5* 36.1* 32.0* 32.4* 30.0*  MCV 99.4 100.6* 100.0 99.1 98.7  PLT 228 202 182 199 [id number]    Basic Metabolic Panel: Recent Labs  Lab 10/14/20 1551 10/15/20 1002 10/16/20 0329 10/17/20 0403 10/18/20 0431  NA 136 139 140 140 138  K 4.1 4.1 4.6 4.0 4.0  CL 100 108 110 110 108  CO2 '27 24 26 24 25  '$ GLUCOSE 114* 91 101* 116* 87  BUN 70* 48* 40* 32* 31*  CREATININE 1.95* 1.22 1.08 1.14 0.96  CALCIUM 9.4 9.1 8.9 8.7* 8.7*  MG  --  2.0 2.0 1.9 2.0  PHOS  --  2.5 2.4* 2.5 2.2*    GFR: Estimated Creatinine Clearance: 53.9 mL/min (by C-G formula based on SCr of 0.96 mg/dL). Liver Function Tests: Recent Labs  Lab 10/14/20 1551 10/15/20 1002 10/16/20 0329 10/17/20 0403 10/18/20 0431  AST '18 18 18 17 16  '$ ALT 11 '5 6 12 '$ <5  ALKPHOS 59 52 49 53 45  BILITOT 0.9 0.8 0.8 0.7 0.6  PROT 7.0 6.7 6.3* 6.1* 5.5*  ALBUMIN 3.6 3.4* 3.1* 3.0* 2.8*    No results for input(s): LIPASE, AMYLASE in the last 168 hours. No results for  input(s): AMMONIA in the last 168 hours. Coagulation Profile: No results for input(s): INR, PROTIME in the last 168 hours. Cardiac Enzymes: No results for input(s): CKTOTAL, CKMB, CKMBINDEX, TROPONINI in the last 168 hours. BNP (last 3 results) No results for input(s): PROBNP in the last 8760 hours. HbA1C: No results for input(s): HGBA1C in the last 72 hours. CBG: No results for input(s): GLUCAP in the last 168 hours. Lipid Profile: No results for input(s): CHOL, HDL, LDLCALC, TRIG, CHOLHDL, LDLDIRECT in the last  72 hours. Thyroid Function Tests: No results for input(s): TSH, T4TOTAL, FREET4, T3FREE, THYROIDAB in the last 72 hours. Anemia Panel: Recent Labs    10/17/20 0403  VITAMINB12 746  FOLATE 12.2  FERRITIN 135  TIBC 212*  IRON 55  RETICCTPCT 1.4    Sepsis Labs: Recent Labs  Lab 10/14/20 1656 10/14/20 1950  LATICACIDVEN 2.1* 2.1*    Recent Results (from the past 240 hour(s))  Urine Culture     Status: Abnormal   Collection Time: 10/14/20  3:00 PM   Specimen: Urine, Clean Catch  Result Value Ref Range Status   Specimen Description   Final    URINE, CLEAN CATCH Performed at [facility], [place] [address], [place], [place] [address]    Special Requests   Final    NONE Performed at [facility], [place] [address], [place], [place] [address]    Culture >=100,000 COLONIES/mL STAPHYLOCOCCUS SIMULANS (A)  Final   Report Status 10/18/2020 FINAL  Final   Organism ID, Bacteria STAPHYLOCOCCUS SIMULANS (A)  Final      Susceptibility   Staphylococcus simulans - MIC*    CIPROFLOXACIN <=0.5 SENSITIVE Sensitive     GENTAMICIN <=0.5 SENSITIVE Sensitive     NITROFURANTOIN <=16 SENSITIVE Sensitive     OXACILLIN <=0.25 SENSITIVE Sensitive     TETRACYCLINE <=1 SENSITIVE Sensitive     VANCOMYCIN <=0.5 SENSITIVE Sensitive     TRIMETH/SULFA <=10 SENSITIVE Sensitive     CLINDAMYCIN RESISTANT Resistant     RIFAMPIN <=0.5 SENSITIVE Sensitive      Inducible Clindamycin POSITIVE Resistant     * >=100,000 COLONIES/mL STAPHYLOCOCCUS SIMULANS  Blood culture (routine single)     Status: None (Preliminary result)   Collection Time: 10/14/20  4:56 PM   Specimen: BLOOD  Result Value Ref Range Status   Specimen Description   Final    BLOOD BLOOD RIGHT FOREARM Performed at [place] [address], [place], [place] [address]    Special Requests   Final    BOTTLES DRAWN AEROBIC AND ANAEROBIC Blood Culture adequate volume Performed at [place] [address], [place], [place] [address]    Culture   Final    NO GROWTH 3 DAYS Performed at [facility], [place] [address], [place], [place] [address]    Report Status PENDING  Incomplete  Resp Panel by RT-PCR (Flu A&B, Covid) Nasopharyngeal Swab     Status: None   Collection Time: 10/14/20  5:04 PM   Specimen: Nasopharyngeal Swab; Nasopharyngeal(NP) swabs in vial transport medium  Result Value Ref Range Status   SARS Coronavirus 2 by RT PCR NEGATIVE NEGATIVE Final    Comment: (NOTE) SARS-CoV-2 target nucleic acids are NOT DETECTED.  The SARS-CoV-2 RNA is generally detectable in upper respiratory specimens during the acute phase of infection. The lowest concentration of SARS-CoV-2 viral copies this assay can detect is 138 copies/mL. A negative result does not preclude SARS-Cov-2 infection and should not be used as the sole basis for treatment or other patient management decisions. A negative result may occur with  improper specimen collection/handling, submission of specimen other than nasopharyngeal swab, presence of viral mutation(s) within the areas targeted by this assay, and inadequate number of viral copies(<138 copies/mL). A negative result must be combined with clinical observations, patient history, and epidemiological information. The expected result is Negative.  Fact Sheet for Patients:   EntrepreneurPulse.com.au  Fact Sheet for Healthcare Providers:  IncredibleEmployment.be  This test  is no t yet approved or cleared by the [place] and  has been authorized for detection and/or diagnosis of SARS-CoV-2 by FDA under an Emergency Use Authorization (EUA). This EUA will remain  in effect (meaning this test can be used) for the duration of the COVID-19 declaration under Section 564(b)(1) of the Act, 21 U.S.C.section 360bbb-3(b)(1), unless the authorization is terminated  or revoked sooner.       Influenza A by PCR NEGATIVE NEGATIVE Final   Influenza B by PCR NEGATIVE NEGATIVE Final    Comment: (NOTE) The Xpert Xpress SARS-CoV-2/FLU/RSV plus assay is intended as an aid in the diagnosis of influenza from Nasopharyngeal swab specimens and should not be used as a sole basis for treatment. Nasal washings and aspirates are unacceptable for Xpert Xpress SARS-CoV-2/FLU/RSV testing.  Fact Sheet for Patients: EntrepreneurPulse.com.au  Fact Sheet for Healthcare Providers: IncredibleEmployment.be  This test is not yet approved or cleared by the [place] FDA and has been authorized for detection and/or diagnosis of SARS-CoV-2 by FDA under an Emergency Use Authorization (EUA). This EUA will remain in effect (meaning this test can be used) for the duration of the COVID-19 declaration under Section 564(b)(1) of the Act, 21 U.S.C. section 360bbb-3(b)(1), unless the authorization is terminated or revoked.  Performed at [facility], [place] [address], [place], [place] [address]   Culture, blood (single)     Status: None (Preliminary result)   Collection Time: 10/14/20  5:05 PM   Specimen: BLOOD  Result Value Ref Range Status   Specimen Description BLOOD SITE NOT SPECIFIED  Final   Special Requests   Final    BOTTLES DRAWN AEROBIC AND ANAEROBIC Blood Culture adequate volume    Culture   Final    NO GROWTH 3 DAYS Performed at [facility], [address] [place], [place] [address]    Report Status PENDING  Incomplete     RN Pressure Injury Documentation:     Estimated body mass index is 24.41 kg/m as calculated from the following:   Height as of this encounter: 6' (1.829 m).   Weight as of this encounter: 81.6 kg.  Malnutrition Type: Nutrition Problem: Increased nutrient needs Etiology: acute illness Malnutrition Characteristics: Signs/Symptoms: estimated needs Nutrition Interventions: Interventions: Ensure Enlive (each supplement provides 350kcal and 20 grams of protein), Boost Breeze, MVI, Magic cup   Radiology Studies: No results found.  Scheduled Meds:  carbidopa-levodopa  1.5 tablet Oral BID   cephALEXin  500 mg Oral Q8H   feeding supplement  1 Container Oral Q24H   feeding supplement  237 mL Oral Q24H   multivitamin with minerals  1 tablet Oral Daily   rivaroxaban  20 mg Oral Q supper   Continuous Infusions:    LOS: 2 days   Kerney Elbe, DO [facility] PAGER is on AMION  If 7PM-7AM, please contact night-coverage [email or web address]

## 2020-10-18 NOTE — Plan of Care (Signed)
Pt  and pt's wife state that pt seems to be getting better. Pt is eating and drinking more; having no problem swallowing. Pt states he is ready to go to rehab.

## 2020-10-19 LAB — CBC WITH DIFFERENTIAL/PLATELET
Abs Immature Granulocytes: 0.03 10*3/uL (ref 0.00–0.07)
Basophils Absolute: 0 10*3/uL (ref 0.0–0.1)
Basophils Relative: 0 %
Eosinophils Absolute: 0.2 10*3/uL (ref 0.0–0.5)
Eosinophils Relative: 2 %
HCT: 30.7 % — ABNORMAL LOW (ref 39.0–52.0)
Hemoglobin: 10 g/dL — ABNORMAL LOW (ref 13.0–17.0)
Immature Granulocytes: 0 %
Lymphocytes Relative: 19 %
Lymphs Abs: 1.5 10*3/uL (ref 0.7–4.0)
MCH: 32.2 pg (ref 26.0–34.0)
MCHC: 32.6 g/dL (ref 30.0–36.0)
MCV: 98.7 fL (ref 80.0–100.0)
Monocytes Absolute: 1 10*3/uL (ref 0.1–1.0)
Monocytes Relative: 12 %
Neutro Abs: 5.2 10*3/uL (ref 1.7–7.7)
Neutrophils Relative %: 67 %
Platelets: 200 10*3/uL (ref 150–400)
RBC: 3.11 MIL/uL — ABNORMAL LOW (ref 4.22–5.81)
RDW: 15 % (ref 11.5–15.5)
WBC: 7.9 10*3/uL (ref 4.0–10.5)
nRBC: 0 % (ref 0.0–0.2)

## 2020-10-19 LAB — COMPREHENSIVE METABOLIC PANEL
ALT: 6 U/L (ref 0–44)
AST: 20 U/L (ref 15–41)
Albumin: 2.8 g/dL — ABNORMAL LOW (ref 3.5–5.0)
Alkaline Phosphatase: 47 U/L (ref 38–126)
Anion gap: 6 (ref 5–15)
BUN: 28 mg/dL — ABNORMAL HIGH (ref 8–23)
CO2: 25 mmol/L (ref 22–32)
Calcium: 8.9 mg/dL (ref 8.9–10.3)
Chloride: 108 mmol/L (ref 98–111)
Creatinine, Ser: 1.09 mg/dL (ref 0.61–1.24)
GFR, Estimated: >60 mL/min (ref >60)
Glucose, Bld: 104 mg/dL — ABNORMAL HIGH (ref 70–99)
Potassium: 4.1 mmol/L (ref 3.5–5.1)
Sodium: 139 mmol/L (ref 135–145)
Total Bilirubin: 0.7 mg/dL (ref 0.3–1.2)
Total Protein: 5.7 g/dL — ABNORMAL LOW (ref 6.5–8.1)

## 2020-10-19 LAB — CULTURE, BLOOD (SINGLE)
Culture: NO GROWTH
Culture: NO GROWTH
Special Requests: ADEQUATE
Special Requests: ADEQUATE

## 2020-10-19 LAB — GLUCOSE, CAPILLARY: Glucose-Capillary: 76 mg/dL (ref 70–99)

## 2020-10-19 LAB — PHOSPHORUS: Phosphorus: 2.7 mg/dL (ref 2.5–4.6)

## 2020-10-19 LAB — MAGNESIUM: Magnesium: 2 mg/dL (ref 1.7–2.4)

## 2020-10-19 NOTE — Progress Notes (Signed)
PROGRESS NOTE    Chad Russell  [id number] DOB: March 27, 1926 DOA: 10/14/2020 PCP: [name], MD   Brief Narrative:  The patient is a 87 year old Caucasian elderly male with a past medical history significant for but not limited to Parkinson's disease on Sinemet, atrial fibrillation on anticoagulation, history of complete heart block for greater than 20 years with no indication of recurrent pacemaker, CAD, hypertension, OSA as well as other comorbidities who presented with a chief complaint of generalized weakness.  He states that he has been feeling weak over the last 5 days and he states is gotten weaker significantly and endorsed poor oral intake.  He denies any falls but does endorse urinary frequency and urgency.  Denies any dysuria and denies any nausea vomiting abdominal pain, chest pain or shortness of breath or any syncopal episodes.  He presented the ED for generalized weakness in the ED is found to be bradycardic with a heart rate in the 30s but not hypotensive.  Labs showed no leukocytosis.  UA showed a large amount of leukocytes and there are greater than 50 WBCs and many bacteria.  He is empirically started on antibiotics for a suspected UTI blood cultures and urine culture obtained.  He is currently being admitted and treated for UTI with IV ceftriaxone and PT OT evaluated and recommending SNF.  The night before last the patient became nauseous and started vomiting and this was was abated with Antiemetics.  Sensitivities are now back for Staphylococcus simulans and case was discussed with infectious diseases Dr. Linus Salmons who recommends transitioning the patient to p.o. Keflex 500 mg 3 times daily.  He is deemed medically stable to be discharged to SNF at this time.  Assessment & Plan:   Principal Problem:   UTI (urinary tract infection) Active Problems:   Complete heart block (HCC)   HTN (hypertension)   AKI (acute kidney injury) (HCC)   Bradycardia  Acute Staphylococcus  Simulans UTI, present on admission -His lactate was elevated 2.1 and repeat was 2.1 but he had no fevers, leukocytosis, tachycardia or hypotension to suggest sepsis; WBC is now 9.0 -Urinalysis showed a turbid appearance with small hemoglobin, large leukocytes, negative nitrites, many bacteria, 21-50 RBCs per high-power field, and greater than 50 WBCs -Urine culture growing Staphyloccous Simulans with Sensitivities Showing only resistance to Clindamycin   -Blood cultures obtained and showed NGTD at 4 Days  -Empirically started on IV ceftriaxone this was changed to p.o. Keflex 500 mg po TID after discussion with Infectious Disease and after sensitivities have -PT OT evaluated and recommending SNF given that he needs at least moderate assistance as well as given that he fatigues easily  AKI on CKD Stage 3a, present on admission -In the setting of prerenal azotemia likely from dehydration -IVF hydration now stopped  -Patient's BUN/Cr went from 70/1.95 -> 48/1.22 -> 40/1.08 -> 32/1.14 -> 31/0.96 -> 28/1.09 -Currently holding his home Lisinopril and Hydrochlorothiazide -Avoiding further nephrotoxic medications, contrast dyes, hypotension renally adjusted medications -Continue monitor and trend renal function carefully and repeat CMP in the a.m.  Bradycardia/complete heart block -He has had a heart rate in the 30s but he is asymptomatic and his blood pressure stable -This has been a chronic issue for several years and has been seen by Dr. Percival Spanish in outpatient setting and the decision was made not to put a pacemaker in as he remained asymptomatic with his -Continue with telemetry monitoring and continue to avoid AV nodal blocking agents -HR ranging in the High 20's  to Mid 30's when sleeping; HR in the 30's   Chronic Atrial fibrillation -Continue with telemetry monitoring -CHA2DS2-VASc score 3 -Continue with anticoagulation with Xarelto  Hypertension -Currently stable -Holding his home  hydrochlorothiazide and lisinopril in the setting of AKI -Continue monitor blood pressures per protocol -Last blood pressure reading was elevated to 156/67  Hypophosphatemia -Patient's Phos Level was 2.2 and improved to 2.7 after repleteion -Continue to Monitor and Replete as Necessary -Repeat Phos Level in the AM   Nausea and Vomiting  -Vomited the night before last but not last night  -C/w Supportive Care -C/w Antiemetics with Ondansetron 4 mg IV q8hprn Nausea  Generalized Weakness -In the setting of infection as above -Placed on fall precautions -PT OT evaluating and recommending SNF -Continue IV fluid hydration as above and received 2 L boluses; Maintenance IVF now stopped  -Nutritionist evaluated and recommending boost breeze once a day, Ensure Plus once a day, multivitamin with minerals daily and Magic cup with lunch meals and they are going to complete a NFPE when feasible  Parkinson's Disease -Continue with Carbidopa-Levodopa 25-100 mg 1.5 tablets p.o. twice daily -C/w Delirium Precautions   Macrocytic Anemia -Patient's hemoglobin/hematocrit went from 10.7/33.5 and is now 11.5/36.1 -> 10.3/32.0 -> 10.4/32.4 -> 9.8/30.0 -> 10.0/30.7 -Checked Anemia Panel and showed an iron level of 55, U IBC 157, TIBC of 212, saturation ratios of 26%, ferritin level of 135, folate level 12.2, and vitamin B12 level 746 -Continue to monitor for signs and symptoms of bleeding; currently no overt bleeding noted -Repeat CBC in a.m.  DVT prophylaxis: Anticoagulated with Rivaroxaban 20 mg po Daily  Code Status: FULL CODE Family Communication: No family present at bedside Disposition Plan: SNF when bed is available uncontrolled authorization is obtained  Status is: Inpatient   The patient will require care spanning > 2 midnights and should be moved to inpatient because: Unsafe d/c plan, IV treatments appropriate due to intensity of illness or inability to take PO, and Inpatient level of care  appropriate due to severity of illness  Dispo: The patient is from: Home              Anticipated d/c is to: SNF              Patient currently is not medically stable to d/c.   Difficult to place patient No  Consultants:  None  Procedures: None  Antimicrobials:  Anti-infectives (From admission, onward)    Start     Dose/Rate Route Frequency Ordered Stop   10/18/20 1400  cephALEXin (KEFLEX) capsule 500 mg        500 mg Oral Every 8 hours 10/18/20 1232 10/21/20 1359   10/15/20 1800  cefTRIAXone (ROCEPHIN) 1 g in sodium chloride 0.9 % 100 mL IVPB  Status:  Discontinued        1 g 200 mL/hr over 30 Minutes Intravenous Every 24 hours 10/14/20 2140 10/18/20 1232   10/14/20 1715  cefTRIAXone (ROCEPHIN) 2 g in sodium chloride 0.9 % 100 mL IVPB  Status:  Discontinued        2 g 200 mL/hr over 30 Minutes Intravenous Every 24 hours 10/14/20 1706 10/14/20 2139        Subjective: Seen and examined at bedside and he is doing relatively well.  States that he had a good night.  No chest pain or shortness of breath.  Feels like he is getting little bit stronger.  No other concerns or complaints this time and is deemed medically stable  to be discharged to SNF and currently awaiting bed availability.  We will repeat a COVID test prior to discharge  Objective: Vitals:   10/18/20 1117 10/18/20 1203 10/18/20 2315 10/19/20 0635  BP: 130/64 118/64 (!) 141/64 (!) 156/67  Pulse: (!) 34 (!) 33 (!) 34 (!) 33  Resp: '16  18 18  '$ Temp: 98.6 F (37 C) 98.5 F (36.9 C) 98 F (36.7 C) 98 F (36.7 C)  TempSrc: Axillary Oral Oral Oral  SpO2:  94% 99% 97%  Weight:      Height:        Intake/Output Summary (Last 24 hours) at 10/19/2020 [id number] Last data filed at 10/19/2020 0700 Gross per 24 hour  Intake 840 ml  Output 600 ml  Net 240 ml    Filed Weights   10/14/20 1501  Weight: 81.6 kg   Examination: Physical Exam:  Constitutional: WN/WD elderly chronically ill-appearing Caucasian male currently  in no acute distress appears calm and comfortable and resting fairly well  Eyes: Lids and conjunctivae normal, sclerae anicteric  ENMT: External Ears, Nose appear normal. Grossly normal hearing. Mucous membranes are moist.  Neck: Appears normal, supple, no cervical masses, normal ROM, no appreciable thyromegaly, no appreciable JVD Respiratory: Mildly diminished to auscultation bilaterally, no wheezing, rales, rhonchi or crackles. Normal respiratory effort and patient is not tachypenic. No accessory muscle use.  Unlabored breathing Cardiovascular: Bradycardic rate but regular rhythm, is a 2 out of 6 systolic murmur with no appreciable extremity edema Abdomen: Soft, non-tender, non-distended. Bowel sounds positive.  GU: Deferred. Musculoskeletal: No clubbing / cyanosis of digits/nails. No joint deformity upper and lower extremities.  Skin: No rashes, lesions, ulcers on limited skin evaluation. No induration; Warm and dry.  Neurologic: CN 2-12 grossly intact with no focal deficits. Romberg sign and cerebellar reflexes not assessed.  Psychiatric: Normal judgment and insight.  Resting but he is easily aroused and alert and oriented. Normal mood and appropriate affect.   Data Reviewed: I have personally reviewed following labs and imaging studies  CBC: Recent Labs  Lab 10/15/20 1002 10/16/20 0329 10/17/20 0403 10/18/20 0431 10/19/20 0411  WBC 7.0 6.5 9.0 6.4 7.9  NEUTROABS 4.4 4.0 4.5 3.8 5.2  HGB 11.5* 10.3* 10.4* 9.8* 10.0*  HCT 36.1* 32.0* 32.4* 30.0* 30.7*  MCV 100.6* 100.0 99.1 98.7 98.7  PLT 202 182 199 196 [id number]    Basic Metabolic Panel: Recent Labs  Lab 10/15/20 1002 10/16/20 0329 10/17/20 0403 10/18/20 0431 10/19/20 0411  NA 139 140 140 138 139  K 4.1 4.6 4.0 4.0 4.1  CL 108 110 110 108 108  CO2 '24 26 24 25 25  '$ GLUCOSE 91 101* 116* 87 104*  BUN 48* 40* 32* 31* 28*  CREATININE 1.22 1.08 1.14 0.96 1.09  CALCIUM 9.1 8.9 8.7* 8.7* 8.9  MG 2.0 2.0 1.9 2.0 2.0  PHOS 2.5  2.4* 2.5 2.2* 2.7    GFR: Estimated Creatinine Clearance: 47.5 mL/min (by C-G formula based on SCr of 1.09 mg/dL). Liver Function Tests: Recent Labs  Lab 10/15/20 1002 10/16/20 0329 10/17/20 0403 10/18/20 0431 10/19/20 0411  AST '18 18 17 16 20  '$ ALT '5 6 12 '$ <5 6  ALKPHOS 52 49 53 45 47  BILITOT 0.8 0.8 0.7 0.6 0.7  PROT 6.7 6.3* 6.1* 5.5* 5.7*  ALBUMIN 3.4* 3.1* 3.0* 2.8* 2.8*    No results for input(s): LIPASE, AMYLASE in the last 168 hours. No results for input(s): AMMONIA in the last 168 hours. Coagulation Profile: No  results for input(s): INR, PROTIME in the last 168 hours. Cardiac Enzymes: No results for input(s): CKTOTAL, CKMB, CKMBINDEX, TROPONINI in the last 168 hours. BNP (last 3 results) No results for input(s): PROBNP in the last 8760 hours. HbA1C: No results for input(s): HGBA1C in the last 72 hours. CBG: Recent Labs  Lab 10/19/20 0806  GLUCAP 76   Lipid Profile: No results for input(s): CHOL, HDL, LDLCALC, TRIG, CHOLHDL, LDLDIRECT in the last 72 hours. Thyroid Function Tests: No results for input(s): TSH, T4TOTAL, FREET4, T3FREE, THYROIDAB in the last 72 hours. Anemia Panel: Recent Labs    10/17/20 0403  VITAMINB12 746  FOLATE 12.2  FERRITIN 135  TIBC 212*  IRON 55  RETICCTPCT 1.4    Sepsis Labs: Recent Labs  Lab 10/14/20 1656 10/14/20 1950  LATICACIDVEN 2.1* 2.1*    Recent Results (from the past 240 hour(s))  Urine Culture     Status: Abnormal   Collection Time: 10/14/20  3:00 PM   Specimen: Urine, Clean Catch  Result Value Ref Range Status   Specimen Description   Final    URINE, CLEAN CATCH Performed at [facility], [place] [address], [place], [place] [address]    Special Requests   Final    NONE Performed at [facility], [place] [address], [place], [place] [address]    Culture >=100,000 COLONIES/mL STAPHYLOCOCCUS SIMULANS (A)  Final   Report Status 10/18/2020 FINAL  Final   Organism ID,  Bacteria STAPHYLOCOCCUS SIMULANS (A)  Final      Susceptibility   Staphylococcus simulans - MIC*    CIPROFLOXACIN <=0.5 SENSITIVE Sensitive     GENTAMICIN <=0.5 SENSITIVE Sensitive     NITROFURANTOIN <=16 SENSITIVE Sensitive     OXACILLIN <=0.25 SENSITIVE Sensitive     TETRACYCLINE <=1 SENSITIVE Sensitive     VANCOMYCIN <=0.5 SENSITIVE Sensitive     TRIMETH/SULFA <=10 SENSITIVE Sensitive     CLINDAMYCIN RESISTANT Resistant     RIFAMPIN <=0.5 SENSITIVE Sensitive     Inducible Clindamycin POSITIVE Resistant     * >=100,000 COLONIES/mL STAPHYLOCOCCUS SIMULANS  Blood culture (routine single)     Status: None (Preliminary result)   Collection Time: 10/14/20  4:56 PM   Specimen: BLOOD  Result Value Ref Range Status   Specimen Description   Final    BLOOD BLOOD RIGHT FOREARM Performed at [place] [address], [place], [place] [address]    Special Requests   Final    BOTTLES DRAWN AEROBIC AND ANAEROBIC Blood Culture adequate volume Performed at [facility] [address] [place], [place] [address]    Culture   Final    NO GROWTH 4 DAYS Performed at [facility], [place] [address], [place], [place] [address]    Report Status PENDING  Incomplete  Resp Panel by RT-PCR (Flu A&B, Covid) Nasopharyngeal Swab     Status: None   Collection Time: 10/14/20  5:04 PM   Specimen: Nasopharyngeal Swab; Nasopharyngeal(NP) swabs in vial transport medium  Result Value Ref Range Status   SARS Coronavirus 2 by RT PCR NEGATIVE NEGATIVE Final    Comment: (NOTE) SARS-CoV-2 target nucleic acids are NOT DETECTED.  The SARS-CoV-2 RNA is generally detectable in upper respiratory specimens during the acute phase of infection. The lowest concentration of SARS-CoV-2 viral copies this assay can detect is 138 copies/mL. A negative result does not preclude SARS-Cov-2 infection and should not be used as the sole basis for treatment or  other patient management  decisions. A negative result may occur with  improper specimen collection/handling, submission of specimen other than nasopharyngeal swab, presence of viral mutation(s) within the areas targeted by this assay, and inadequate number of viral copies(<138 copies/mL). A negative result must be combined with clinical observations, patient history, and epidemiological information. The expected result is Negative.  Fact Sheet for Patients:  EntrepreneurPulse.com.au  Fact Sheet for Healthcare Providers:  IncredibleEmployment.be  This test is no t yet approved or cleared by the [place] FDA and  has been authorized for detection and/or diagnosis of SARS-CoV-2 by FDA under an Emergency Use Authorization (EUA). This EUA will remain  in effect (meaning this test can be used) for the duration of the COVID-19 declaration under Section 564(b)(1) of the Act, 21 U.S.C.section 360bbb-3(b)(1), unless the authorization is terminated  or revoked sooner.       Influenza A by PCR NEGATIVE NEGATIVE Final   Influenza B by PCR NEGATIVE NEGATIVE Final    Comment: (NOTE) The Xpert Xpress SARS-CoV-2/FLU/RSV plus assay is intended as an aid in the diagnosis of influenza from Nasopharyngeal swab specimens and should not be used as a sole basis for treatment. Nasal washings and aspirates are unacceptable for Xpert Xpress SARS-CoV-2/FLU/RSV testing.  Fact Sheet for Patients: EntrepreneurPulse.com.au  Fact Sheet for Healthcare Providers: IncredibleEmployment.be  This test is not yet approved or cleared by the [place] FDA and has been authorized for detection and/or diagnosis of SARS-CoV-2 by FDA under an Emergency Use Authorization (EUA). This EUA will remain in effect (meaning this test can be used) for the duration of the COVID-19 declaration under Section 564(b)(1) of the Act, 21 U.S.C. section 360bbb-3(b)(1), unless the  authorization is terminated or revoked.  Performed at [facility], [place] [address], [place], [place] [address]   Culture, blood (single)     Status: None (Preliminary result)   Collection Time: 10/14/20  5:05 PM   Specimen: BLOOD  Result Value Ref Range Status   Specimen Description BLOOD SITE NOT SPECIFIED  Final   Special Requests   Final    BOTTLES DRAWN AEROBIC AND ANAEROBIC Blood Culture adequate volume   Culture   Final    NO GROWTH 4 DAYS Performed at [facility], [address], [place],  [address]    Report Status PENDING  Incomplete     RN Pressure Injury Documentation:     Estimated body mass index is 24.41 kg/m as calculated from the following:   Height as of this encounter: 6' (1.829 m).   Weight as of this encounter: 81.6 kg.  Malnutrition Type: Nutrition Problem: Increased nutrient needs Etiology: acute illness Malnutrition Characteristics: Signs/Symptoms: estimated needs Nutrition Interventions: Interventions: Ensure Enlive (each supplement provides 350kcal and 20 grams of protein), Boost Breeze, MVI, Magic cup   Radiology Studies: No results found.  Scheduled Meds:  calcium carbonate  1 tablet Oral BID WC   carbidopa-levodopa  1.5 tablet Oral BID   cephALEXin  500 mg Oral Q8H   feeding supplement  1 Container Oral Q24H   feeding supplement  237 mL Oral Q24H   multivitamin with minerals  1 tablet Oral Daily   rivaroxaban  20 mg Oral Q supper   Continuous Infusions:    LOS: 3 days   Kerney Elbe, DO [facility] PAGER is on AMION  If 7PM-7AM, please contact night-coverage [email or web address]

## 2020-10-19 NOTE — NC FL2 (Signed)
[place] LEVEL OF CARE SCREENING TOOL     IDENTIFICATION  Patient Name: Chad Russell Birthdate: [birth year] Sex: male Admission Date (Current Location): 10/14/2020  [facility] and [place] Number:  Herbalist and Address:  [facility],  [place], [place]      Provider Number: [id number]  Attending Physician Name and Address:  Kerney Elbe, DO  Relative Name and Phone Number:       Current Level of Care: Hospital Recommended Level of Care: [place]  Prior Approval Number:    Date Approved/Denied:   PASRR Number: [id number]  Discharge Plan: SNF    Current Diagnoses: Patient Active Problem List   Diagnosis Date Noted   UTI (urinary tract infection) 10/14/2020   Bradycardia 10/14/2020   Educated about COVID-19 virus infection 11/29/2019   Exudative age-related macular degeneration of left eye with active choroidal neovascularization ([place]) 10/02/2019   Central retinal vein occlusion with macular edema of right eye 06/20/2019   Central retinal vein occlusion with neovascularization of right eye 06/20/2019   Ocular ischemic syndrome 06/20/2019   Retinal hemorrhage of left eye 06/20/2019   Retinal hemorrhage of right eye 06/20/2019   AKI (acute kidney injury) ([place]) 12/15/2018   Hematoma of scalp    Heart block    Chest pain 05/04/2018   Fracture of femoral neck, left ([place]) 04/19/2017   MGUS (monoclonal gammopathy of unknown significance) 01/15/2017   BPH with obstruction/lower urinary tract symptoms 09/07/2016   Preoperative cardiovascular examination 08/24/2016   Urinary retention 05/13/2016   Balance problem 10/31/2013   OSA (obstructive sleep apnea) 10/31/2013   BPH (benign prostatic hyperplasia) 08/28/2012   Atrial fibrillation ([place]) 06/08/2012   Hyperplasia of prostate with lower urinary tract symptoms (LUTS) 04/28/2012   Moderate malnutrition ([place]) 04/28/2012   Infection of urinary  tract - recurrent 04/26/2012   NSTEMI (non-ST elevated myocardial infarction) ([place]) 03/15/2011   Complete heart block ([place]) 03/15/2011   HTN (hypertension) 03/15/2011    Orientation RESPIRATION BLADDER Height & Weight     Self, Time, Situation, Place  Normal Incontinent Weight: 81.6 kg Height:  6' (182.9 cm)  BEHAVIORAL SYMPTOMS/MOOD NEUROLOGICAL BOWEL NUTRITION STATUS      Incontinent Diet (Heart Healthy)  AMBULATORY STATUS COMMUNICATION OF NEEDS Skin   Extensive Assist Verbally Bruising                       Personal Care Assistance Level of Assistance  Bathing, Feeding, Dressing Bathing Assistance: Limited assistance Feeding assistance: Limited assistance Dressing Assistance: Limited assistance     Functional Limitations Info  Sight, Hearing, Speech Sight Info: Impaired Hearing Info: Adequate Speech Info: Adequate    SPECIAL CARE FACTORS FREQUENCY  PT (By licensed PT), OT (By licensed OT)     PT Frequency: 5 x weekly OT Frequency: 5 x weekly            Contractures Contractures Info: Not present    Additional Factors Info  Code Status, Allergies Code Status Info: Full Allergies Info: Doxycycline, Keflex           Current Medications (10/19/2020):  This is the current hospital active medication list Current Facility-Administered Medications  Medication Dose Route Frequency Provider Last Rate Last Admin   acetaminophen (TYLENOL) tablet 650 mg  650 mg Oral Q6H PRN Shela Leff, MD   650 mg at 10/19/20 0645   Or   acetaminophen (TYLENOL) suppository 650 mg  650 mg Rectal  Q6H PRN Shela Leff, MD       calcium carbonate (TUMS - dosed in mg elemental calcium) chewable tablet 200 mg of elemental calcium  1 tablet Oral BID WC Sheikh, Omair Latif, DO   200 mg of elemental calcium at 10/19/20 0959   carbidopa-levodopa (SINEMET IR) 25-100 MG per tablet immediate release 1.5 tablet  1.5 tablet Oral BID Shela Leff, MD   1.5 tablet at 10/19/20  1000   cephALEXin (KEFLEX) capsule 500 mg  500 mg Oral [address] Omair [place], DO   500 mg at 10/19/20 0543   feeding supplement (BOOST / RESOURCE BREEZE) liquid 1 Container  1 Container Oral Q24H Sheikh, Georgina Quint [place], DO   1 Container at 10/19/20 1016   feeding supplement (ENSURE ENLIVE / ENSURE PLUS) liquid 237 mL  237 mL Oral Q24H Raiford Noble [place], DO   237 mL at 10/18/20 1607   multivitamin with minerals tablet 1 tablet  1 tablet Oral Daily Raiford Noble [place], DO   1 tablet at 10/19/20 0959   ondansetron [facility]) injection 4 mg  4 mg Intravenous Q8H PRN Sheikh, Omair Latif, DO       rivaroxaban Alveda Reasons) tablet 20 mg  20 mg Oral Q supper Shela Leff, MD   20 mg at 10/18/20 1607     Discharge Medications: Please see discharge summary for a list of discharge medications.  Relevant Imaging Results:  Relevant Lab Results:   Additional Information SSN: [id number]  Wilmina Maxham, Marjie Skiff, RN

## 2020-10-19 NOTE — TOC Initial Note (Signed)
Transition of Care [facility]) - Initial/Assessment Note    Patient Details  Name: Chad Russell MRN: [id number] Date of Birth: 1926/06/23  [facility] CM/SW Contact:    Lynnell Catalan, RN Phone Number: 10/19/2020, 1:00 PM  Clinical Narrative:                 Spoke with wife via phone for dc planning. Wife states that her and pt both think that pt needs to go to rehab for a little while. She states that pt has been to [facility] before and he would like to go back there. FL2 faxed out to area SNFs. SNF bed offers will be presented when received. Auth will be started when SNF bed chosen. TOC will continue to follow.  Expected Discharge Plan: Skilled Nursing Facility Barriers to Discharge: Continued Medical Work up   Patient Goals and CMS Choice Patient states their goals for this hospitalization and ongoing recovery are:: To go to rehab      Expected Discharge Plan and Services Expected Discharge Plan: [place]   Discharge Planning Services: CM Consult   Living arrangements for the past 2 months: Single Family Home                     Prior Living Arrangements/Services Living arrangements for the past 2 months: Single Family Home Lives with:: Spouse Patient language and need for interpreter reviewed:: Yes        Need for Family Participation in Patient Care: Yes (Comment) Care giver support system in place?: Yes (comment)   Criminal Activity/Legal Involvement Pertinent to Current Situation/Hospitalization: No - Comment as needed  Activities of Daily Living Home Assistive Devices/Equipment: Environmental consultant (specify type) (rolator) ADL Screening (condition at time of admission) Patient's cognitive ability adequate to safely complete daily activities?: Yes Is the patient deaf or have difficulty hearing?: No Does the patient have difficulty seeing, even when wearing glasses/contacts?: Yes Does the patient have difficulty concentrating, remembering, or  making decisions?: No (wife states he has a great mind) Patient able to express need for assistance with ADLs?: Yes Does the patient have difficulty dressing or bathing?: Yes Independently performs ADLs?: No Communication: Independent Dressing (OT): Needs assistance Is this a change from baseline?: Change from baseline, expected to last <3days Grooming: Independent Feeding: Independent Bathing: Needs assistance Is this a change from baseline?: Change from baseline, expected to last <3 days Toileting: Needs assistance Is this a change from baseline?: Change from baseline, expected to last <3 days In/Out Bed: Needs assistance Is this a change from baseline?: Change from baseline, expected to last <3 days Walks in Home: Needs assistance Is this a change from baseline?: Change from baseline, expected to last <3 days Does the patient have difficulty walking or climbing stairs?: Yes Weakness of Legs: Both Weakness of Arms/Hands: Both  Permission Sought/Granted   Permission granted to share information with : Yes, Verbal Permission Granted     Permission granted to share info w AGENCY: SNFs in the hub        Emotional Assessment       Orientation: : Oriented to Self, Oriented to Place, Oriented to  Time, Oriented to Situation Alcohol / Substance Use: Not Applicable Psych Involvement: No (comment)  Admission diagnosis:  UTI (urinary tract infection) [N39.0] Sepsis with acute renal failure without septic shock, due to unspecified organism, unspecified acute renal failure type ([place]) [A41.9, R65.20, N17.9] Patient Active Problem List   Diagnosis Date Noted  UTI (urinary tract infection) 10/14/2020   Bradycardia 10/14/2020   Educated about COVID-19 virus infection 11/29/2019   Exudative age-related macular degeneration of left eye with active choroidal neovascularization ([place]) 10/02/2019   Central retinal vein occlusion with macular edema of right eye 06/20/2019   Central retinal  vein occlusion with neovascularization of right eye 06/20/2019   Ocular ischemic syndrome 06/20/2019   Retinal hemorrhage of left eye 06/20/2019   Retinal hemorrhage of right eye 06/20/2019   AKI (acute kidney injury) ([place]) 12/15/2018   Hematoma of scalp    Heart block    Chest pain 05/04/2018   Fracture of femoral neck, left ([place]) 04/19/2017   MGUS (monoclonal gammopathy of unknown significance) 01/15/2017   BPH with obstruction/lower urinary tract symptoms 09/07/2016   Preoperative cardiovascular examination 08/24/2016   Urinary retention 05/13/2016   Balance problem 10/31/2013   OSA (obstructive sleep apnea) 10/31/2013   BPH (benign prostatic hyperplasia) 08/28/2012   Atrial fibrillation ([place]) 06/08/2012   Hyperplasia of prostate with lower urinary tract symptoms (LUTS) 04/28/2012   Moderate malnutrition ([place]) 04/28/2012   Infection of urinary tract - recurrent 04/26/2012   NSTEMI (non-ST elevated myocardial infarction) ([place]) 03/15/2011   Complete heart block ([place]) 03/15/2011   HTN (hypertension) 03/15/2011   PCP:  [name], MD Pharmacy:   [facility] [id number] [place] [address] Phone: [phone] [phone]     Social Determinants of Health (SDOH) Interventions    Readmission Risk Interventions No flowsheet data found.

## 2020-10-20 DIAGNOSIS — N3000 Acute cystitis without hematuria: Secondary | ICD-10-CM

## 2020-10-20 LAB — SARS CORONAVIRUS 2 (TAT 6-24 HRS): SARS Coronavirus 2: NEGATIVE

## 2020-10-20 MED ORDER — ENSURE ENLIVE PO LIQD: 237.0000 mL | ORAL | 12 refills | Status: AC

## 2020-10-20 MED ORDER — CEPHALEXIN 500 MG PO CAPS: 500.0000 mg | ORAL_CAPSULE | Freq: Three times a day (TID) | ORAL | 0 refills | Status: AC

## 2020-10-20 MED ORDER — CALCIUM CARBONATE ANTACID 500 MG PO CHEW: 1.0000 | CHEWABLE_TABLET | Freq: Two times a day (BID) | ORAL | Status: AC

## 2020-10-20 MED ORDER — COVID-19 MRNA VAC-TRIS(PFIZER) 30 MCG/0.3ML IM SUSP
0.3000 mL | Freq: Once | INTRAMUSCULAR | Status: AC
  Administered 2020-10-20: 0.3 mL via INTRAMUSCULAR
  Filled 2020-10-20: qty 0.3

## 2020-10-20 NOTE — Discharge Summary (Signed)
Physician Discharge Summary  MAR SAMARRIPA [id number] DOB: 06-Jan-1927 DOA: 10/14/2020  PCP: [name], MD  Admit date: 10/14/2020 Discharge date: 10/20/2020  Admitted From: Home Disposition: SNF  Recommendations for Outpatient Follow-up:  Follow up with PCP in 1-2 weeks Follow up with Cardiology Dr. Percival Spanish within 1-2 weeks Please obtain CMP/CBC, Mag, Phos in one week Please follow up on the following pending results:  Home Health: No Equipment/Devices: None  Discharge Condition: Stable CODE STATUS: FULL CODE Diet recommendation: Heart Healthy Diet   Brief/Interim Summary: The patient is a 87 year old Caucasian elderly male with a past medical history significant for but not limited to Parkinson's disease on Sinemet, atrial fibrillation on anticoagulation, history of complete heart block for greater than 20 years with no indication of recurrent pacemaker, CAD, hypertension, OSA as well as other comorbidities who presented with a chief complaint of generalized weakness.  He states that he has been feeling weak over the last 5 days and he states is gotten weaker significantly and endorsed poor oral intake.  He denies any falls but does endorse urinary frequency and urgency.  Denies any dysuria and denies any nausea vomiting abdominal pain, chest pain or shortness of breath or any syncopal episodes.  He presented the ED for generalized weakness in the ED is found to be bradycardic with a heart rate in the 30s but not hypotensive.  Labs showed no leukocytosis.  UA showed a large amount of leukocytes and there are greater than 50 WBCs and many bacteria.  He is empirically started on antibiotics for a suspected UTI blood cultures and urine culture obtained.  He is currently being admitted and treated for UTI with IV ceftriaxone and PT OT evaluated and recommending SNF.   The night before last the patient became nauseous and started vomiting and this was was abated with Antiemetics.   Sensitivities are now back for Staphylococcus simulans and case was discussed with infectious diseases Dr. Linus Salmons who recommends transitioning the patient to p.o. Keflex 500 mg 3 times daily.  He is deemed medically stable to be discharged to SNF at this time and awaiting bed availability    Discharge Diagnoses:  Principal Problem:   UTI (urinary tract infection) Active Problems:   Complete heart block (HCC)   HTN (hypertension)   AKI (acute kidney injury) ([place])   Bradycardia  Acute Staphylococcus Simulans UTI, present on admission -His lactate was elevated 2.1 and repeat was 2.1 but he had no fevers, leukocytosis, tachycardia or hypotension to suggest sepsis; WBC is now 9.0 -Urinalysis showed a turbid appearance with small hemoglobin, large leukocytes, negative nitrites, many bacteria, 21-50 RBCs per high-power field, and greater than 50 WBCs -Urine culture growing Staphyloccous Simulans with Sensitivities Showing only resistance to Clindamycin   -Blood cultures obtained and showed NGTD at 4 Days  -Empirically started on IV ceftriaxone this was changed to p.o. Keflex 500 mg po TID after discussion with Infectious Disease and after sensitivities have -PT OT evaluated and recommending SNF given that he needs at least moderate assistance as well as given that he fatigues easily   AKI on CKD Stage 3a, present on admission -In the setting of prerenal azotemia likely from dehydration -IVF hydration now stopped  -Patient's BUN/Cr went from 70/1.95 -> 48/1.22 -> 40/1.08 -> 32/1.14 -> 31/0.96 -> 28/1.09 on last check  -Currently holding his home Lisinopril and Hydrochlorothiazide but will resume at D/C  -Avoiding further nephrotoxic medications, contrast dyes, hypotension renally adjusted medications -Continue monitor and trend renal  function carefully and repeat CMP in the a.m.   Bradycardia/complete heart block -He has had a heart rate in the 30s but he is asymptomatic and his blood pressure  stable -This has been a chronic issue for several years and has been seen by Dr. Percival Spanish in outpatient setting and the decision was made not to put a pacemaker in as he remained asymptomatic with his -Continue with telemetry monitoring and continue to avoid AV nodal blocking agents -HR ranging in the High 20's to Mid 30's when sleeping; HR in the 30's  -Follow up with Cardiology Dr. Percival Spanish in the outpatient setting    Chronic Atrial fibrillation -Continue with telemetry monitoring -CHA2DS2-VASc score 3 -Continue with anticoagulation with Xarelto   Hypertension -Currently stable -Holding his home hydrochlorothiazide and lisinopril in the setting of AKI but will resume now  -Continue monitor blood pressures per protocol -Last blood pressure reading was elevated to 166/70   Hypophosphatemia -Patient's Phos Level was 2.2 and improved to 2.7 after repleteion; Has not been rechecked today  -Continue to Monitor and Replete as Necessary -Repeat Phos Level in the outpatient setting    Nausea and Vomiting  -Vomited the night before last but not last night  -C/w Supportive Care -C/w Antiemetics with Ondansetron 4 mg IV q8hprn Nausea   Generalized Weakness -In the setting of infection as above -Placed on fall precautions -PT OT evaluating and recommending SNF -Continue IV fluid hydration as above and received 2 L boluses; Maintenance IVF now stopped  -Nutritionist evaluated and recommending boost breeze once a day, Ensure Plus once a day, multivitamin with minerals daily and Magic cup with lunch meals and they are going to complete a NFPE when feasible   Parkinson's Disease -Continue with Carbidopa-Levodopa 25-100 mg 1.5 tablets p.o. twice daily -C/w Delirium Precautions    Macrocytic Anemia -Patient's hemoglobin/hematocrit went from 10.7/33.5 and is now 11.5/36.1 -> 10.3/32.0 -> 10.4/32.4 -> 9.8/30.0 -> 10.0/30.7 on last check -Checked Anemia Panel and showed an iron level of 55, U  IBC 157, TIBC of 212, saturation ratios of 26%, ferritin level of 135, folate level 12.2, and vitamin B12 level 746 -Continue to monitor for signs and symptoms of bleeding; currently no overt bleeding noted -Repeat CBC within 1 week   Discharge Instructions Discharge Instructions     Call MD for:  difficulty breathing, headache or visual disturbances   Complete by: As directed    Call MD for:  extreme fatigue   Complete by: As directed    Call MD for:  hives   Complete by: As directed    Call MD for:  persistant dizziness or light-headedness   Complete by: As directed    Call MD for:  persistant nausea and vomiting   Complete by: As directed    Call MD for:  redness, tenderness, or signs of infection (pain, swelling, redness, odor or green/yellow discharge around incision site)   Complete by: As directed    Call MD for:  severe uncontrolled pain   Complete by: As directed    Call MD for:  temperature >100.4   Complete by: As directed    Diet - low sodium heart healthy   Complete by: As directed    Discharge instructions   Complete by: As directed    You were cared for by a hospitalist during your hospital stay. If you have any questions about your discharge medications or the care you received while you were in the hospital after you are discharged,  you can call the unit and ask to speak with the hospitalist on call if the hospitalist that took care of you is not available. Once you are discharged, your primary care physician will handle any further medical issues. Please note that NO REFILLS for any discharge medications will be authorized once you are discharged, as it is imperative that you return to your primary care physician (or establish a relationship with a primary care physician if you do not have one) for your aftercare needs so that they can reassess your need for medications and monitor your lab values.  Follow up with PCP and Cardiology within 1-2 weeks. Take all  medications as prescribed. If symptoms change or worsen please return to the ED for evaluation   Increase activity slowly   Complete by: As directed       Allergies as of 10/20/2020       Reactions   Doxycycline Other (See Comments)   Upset stomach   Keflex [cephalexin] Other (See Comments)   Abdominal discomfort 04-19-17 Pt has tolerated orally        Medication List     STOP taking these medications    bisacodyl 5 MG EC tablet Commonly known as: DULCOLAX       TAKE these medications    acetaminophen 500 MG tablet Commonly known as: TYLENOL Take 1 tablet (500 mg total) by mouth every 8 (eight) hours. What changed:  when to take this reasons to take this   calcium carbonate 500 MG chewable tablet Commonly known as: TUMS - dosed in mg elemental calcium Chew 1 tablet (200 mg of elemental calcium total) by mouth 2 (two) times daily with a meal.   carbidopa-levodopa 25-100 MG tablet Commonly known as: Sinemet Take 1.5 tablets by mouth in the morning and at bedtime.   cephALEXin 500 MG capsule Commonly known as: KEFLEX Take 1 capsule (500 mg total) by mouth every 8 (eight) hours for 2 days.   docusate sodium 100 MG capsule Commonly known as: COLACE Take 200 mg by mouth daily as needed for mild constipation.   feeding supplement Liqd Take 237 mLs by mouth daily.   lisinopril-hydrochlorothiazide 10-12.5 MG tablet Commonly known as: ZESTORETIC TAKE ONE TABLET BY MOUTH DAILY   PRESERVISION AREDS 2+MULTI VIT PO Take 1 capsule by mouth 2 (two) times daily.   Prostate Caps Take 1 tablet by mouth 2 (two) times daily. Prostate Plus otc supplement   rivaroxaban 20 MG Tabs tablet Commonly known as: Xarelto TAKE ONE TABLET BY MOUTH DAILY WITH SUPPER What changed:  how much to take how to take this when to take this additional instructions   senna 8.6 MG Tabs tablet Commonly known as: SENOKOT Take 1 tablet by mouth daily as needed for mild constipation.    timolol 0.5 % ophthalmic solution Commonly known as: TIMOPTIC INSTILL ONE DROP IN [facility] EYE DAILY What changed:  how much to take how to take this when to take this additional instructions   vitamin C 500 MG tablet Commonly known as: ASCORBIC ACID Take 500 mg by mouth daily.        Follow-up Information     [name], MD. Call.   Specialties: Family Medicine, Sports Medicine Why: Follow up within 1-2 weeks Contact information: [place] [id number] [phone]         Minus Breeding, MD Follow up.   Specialty: Cardiology Why: Follow up within 1-2 weeks Contact information: [address]  [place] [address] [phone]                Allergies  Allergen Reactions   Doxycycline Other (See Comments)    Upset stomach   Keflex [Cephalexin] Other (See Comments)    Abdominal discomfort 04-19-17 Pt has tolerated orally   Consultations: None  Procedures/Studies: No results found.  Subjective: Seen and examined in bedside he was doing well.  Denied any chest pain or shortness of breath.  States that he had a good night.  No other concerns or complaints at this time is currently awaiting bed availability to go for rehab.  Discharge Exam: Vitals:   10/20/20 0000 10/20/20 0400  BP:  (!) 166/70  Pulse:  (!) 29  Resp: 13 12  Temp:  98 F (36.7 C)  SpO2:  99%   Vitals:   10/19/20 1900 10/19/20 2000 10/20/20 0000 10/20/20 0400  BP: (!) 110/57   (!) 166/70  Pulse: (!) 33   (!) 29  Resp: '14 18 13 12  '$ Temp: 98.1 F (36.7 C)   98 F (36.7 C)  TempSrc: Axillary   Axillary  SpO2: 97%   99%  Weight:      Height:       General: Pt is alert, awake, not in acute distress Cardiovascular: Bradycardic rate but regular rhythm, S1/S2 +, no rubs, no gallops Respiratory: Diminished bilaterally, no wheezing, no rhonchi; unlabored breathing Abdominal: Soft, NT, ND, bowel sounds + Extremities: no appreciable edema, no  cyanosis  The results of significant diagnostics from this hospitalization (including imaging, microbiology, ancillary and laboratory) are listed below for reference.    Microbiology: Recent Results (from the past 240 hour(s))  Urine Culture     Status: Abnormal   Collection Time: 10/14/20  3:00 PM   Specimen: Urine, Clean Catch  Result Value Ref Range Status   Specimen Description   Final    URINE, CLEAN CATCH Performed at [facility], [place] [address], [place], [place] [address]    Special Requests   Final    NONE Performed at [facility], [place] [address] [place], [place] [address]    Culture >=100,000 COLONIES/mL STAPHYLOCOCCUS SIMULANS (A)  Final   Report Status 10/18/2020 FINAL  Final   Organism ID, Bacteria STAPHYLOCOCCUS SIMULANS (A)  Final      Susceptibility   Staphylococcus simulans - MIC*    CIPROFLOXACIN <=0.5 SENSITIVE Sensitive     GENTAMICIN <=0.5 SENSITIVE Sensitive     NITROFURANTOIN <=16 SENSITIVE Sensitive     OXACILLIN <=0.25 SENSITIVE Sensitive     TETRACYCLINE <=1 SENSITIVE Sensitive     VANCOMYCIN <=0.5 SENSITIVE Sensitive     TRIMETH/SULFA <=10 SENSITIVE Sensitive     CLINDAMYCIN RESISTANT Resistant     RIFAMPIN <=0.5 SENSITIVE Sensitive     Inducible Clindamycin POSITIVE Resistant     * >=100,000 COLONIES/mL STAPHYLOCOCCUS SIMULANS  Blood culture (routine single)     Status: None   Collection Time: 10/14/20  4:56 PM   Specimen: BLOOD  Result Value Ref Range Status   Specimen Description   Final    BLOOD BLOOD RIGHT FOREARM Performed at [facility] [address], [place], [place] [address]    Special Requests   Final    BOTTLES DRAWN AEROBIC AND ANAEROBIC Blood Culture adequate volume Performed at [facility] [address] [place], [place] [address]    Culture   Final    NO GROWTH 5 DAYS  Performed at [facility], [place] [address] [place], [place] [address]     Report Status 10/19/2020 FINAL  Final  Resp Panel by RT-PCR (Flu A&B, Covid) Nasopharyngeal Swab     Status: None   Collection Time: 10/14/20  5:04 PM   Specimen: Nasopharyngeal Swab; Nasopharyngeal(NP) swabs in vial transport medium  Result Value Ref Range Status   SARS Coronavirus 2 by RT PCR NEGATIVE NEGATIVE Final    Comment: (NOTE) SARS-CoV-2 target nucleic acids are NOT DETECTED.  The SARS-CoV-2 RNA is generally detectable in upper respiratory specimens during the acute phase of infection. The lowest concentration of SARS-CoV-2 viral copies this assay can detect is 138 copies/mL. A negative result does not preclude SARS-Cov-2 infection and should not be used as the sole basis for treatment or other patient management decisions. A negative result may occur with  improper specimen collection/handling, submission of specimen other than nasopharyngeal swab, presence of viral mutation(s) within the areas targeted by this assay, and inadequate number of viral copies(<138 copies/mL). A negative result must be combined with clinical observations, patient history, and epidemiological information. The expected result is Negative.  Fact Sheet for Patients:  EntrepreneurPulse.com.au  Fact Sheet for Healthcare Providers:  IncredibleEmployment.be  This test is no t yet approved or cleared by the [place] FDA and  has been authorized for detection and/or diagnosis of SARS-CoV-2 by FDA under an Emergency Use Authorization (EUA). This EUA will remain  in effect (meaning this test can be used) for the duration of the COVID-19 declaration under Section 564(b)(1) of the Act, 21 U.S.C.section 360bbb-3(b)(1), unless the authorization is terminated  or revoked sooner.       Influenza A by PCR NEGATIVE NEGATIVE Final   Influenza B by PCR NEGATIVE NEGATIVE Final    Comment: (NOTE) The Xpert Xpress SARS-CoV-2/FLU/RSV plus assay is intended as an aid in  the diagnosis of influenza from Nasopharyngeal swab specimens and should not be used as a sole basis for treatment. Nasal washings and aspirates are unacceptable for Xpert Xpress SARS-CoV-2/FLU/RSV testing.  Fact Sheet for Patients: EntrepreneurPulse.com.au  Fact Sheet for Healthcare Providers: IncredibleEmployment.be  This test is not yet approved or cleared by the [place] FDA and has been authorized for detection and/or diagnosis of SARS-CoV-2 by FDA under an Emergency Use Authorization (EUA). This EUA will remain in effect (meaning this test can be used) for the duration of the COVID-19 declaration under Section 564(b)(1) of the Act, 21 U.S.C. section 360bbb-3(b)(1), unless the authorization is terminated or revoked.  Performed at [facility], [place] [address] [place], [place] [address]   Culture, blood (single)     Status: None   Collection Time: 10/14/20  5:05 PM   Specimen: BLOOD  Result Value Ref Range Status   Specimen Description BLOOD SITE NOT SPECIFIED  Final   Special Requests   Final    BOTTLES DRAWN AEROBIC AND ANAEROBIC Blood Culture adequate volume   Culture   Final    NO GROWTH 5 DAYS Performed at [facility], [address] [place], [place] [address]    Report Status 10/19/2020 FINAL  Final  SARS CORONAVIRUS 2 (TAT 6-24 HRS) Nasopharyngeal Nasopharyngeal Swab     Status: None   Collection Time: 10/19/20  2:20 PM   Specimen: Nasopharyngeal Swab  Result Value Ref Range Status   SARS Coronavirus 2 NEGATIVE NEGATIVE Final    Comment: (NOTE) SARS-CoV-2 target nucleic acids are NOT DETECTED.  The SARS-CoV-2 RNA  is generally detectable in upper and lower respiratory specimens during the acute phase of infection. Negative results do not preclude SARS-CoV-2 infection, do not rule out co-infections with other pathogens, and should not be used as the sole basis for treatment or other patient  management decisions. Negative results must be combined with clinical observations, patient history, and epidemiological information. The expected result is Negative.  Fact Sheet for Patients: SugarRoll.be  Fact Sheet for Healthcare Providers: https://www.woods-mathews.com/  This test is not yet approved or cleared by the [place] FDA and  has been authorized for detection and/or diagnosis of SARS-CoV-2 by FDA under an Emergency Use Authorization (EUA). This EUA will remain  in effect (meaning this test can be used) for the duration of the COVID-19 declaration under Se ction 564(b)(1) of the Act, 21 U.S.C. section 360bbb-3(b)(1), unless the authorization is terminated or revoked sooner.  Performed at [facility], [place] [address] [place], [place] [address]     Labs: BNP (last 3 results) No results for input(s): BNP in the last 8760 hours. Basic Metabolic Panel: Recent Labs  Lab 10/15/20 1002 10/16/20 0329 10/17/20 0403 10/18/20 0431 10/19/20 0411  NA 139 140 140 138 139  K 4.1 4.6 4.0 4.0 4.1  CL 108 110 110 108 108  CO2 '24 26 24 25 25  '$ GLUCOSE 91 101* 116* 87 104*  BUN 48* 40* 32* 31* 28*  CREATININE 1.22 1.08 1.14 0.96 1.09  CALCIUM 9.1 8.9 8.7* 8.7* 8.9  MG 2.0 2.0 1.9 2.0 2.0  PHOS 2.5 2.4* 2.5 2.2* 2.7   Liver Function Tests: Recent Labs  Lab 10/15/20 1002 10/16/20 0329 10/17/20 0403 10/18/20 0431 10/19/20 0411  AST '18 18 17 16 20  '$ ALT '5 6 12 '$ <5 6  ALKPHOS 52 49 53 45 47  BILITOT 0.8 0.8 0.7 0.6 0.7  PROT 6.7 6.3* 6.1* 5.5* 5.7*  ALBUMIN 3.4* 3.1* 3.0* 2.8* 2.8*   No results for input(s): LIPASE, AMYLASE in the last 168 hours. No results for input(s): AMMONIA in the last 168 hours. CBC: Recent Labs  Lab 10/15/20 1002 10/16/20 0329 10/17/20 0403 10/18/20 0431 10/19/20 0411  WBC 7.0 6.5 9.0 6.4 7.9  NEUTROABS 4.4 4.0 4.5 3.8 5.2  HGB 11.5* 10.3* 10.4* 9.8* 10.0*  HCT 36.1* 32.0* 32.4* 30.0*  30.7*  MCV 100.6* 100.0 99.1 98.7 98.7  PLT 202 182 199 196 200   Cardiac Enzymes: No results for input(s): CKTOTAL, CKMB, CKMBINDEX, TROPONINI in the last 168 hours. BNP: Invalid input(s): POCBNP CBG: Recent Labs  Lab 10/19/20 0806  GLUCAP 76   D-Dimer No results for input(s): DDIMER in the last 72 hours. Hgb A1c No results for input(s): HGBA1C in the last 72 hours. Lipid Profile No results for input(s): CHOL, HDL, LDLCALC, TRIG, CHOLHDL, LDLDIRECT in the last 72 hours. Thyroid function studies No results for input(s): TSH, T4TOTAL, T3FREE, THYROIDAB in the last 72 hours.  Invalid input(s): FREET3 Anemia work up No results for input(s): VITAMINB12, FOLATE, FERRITIN, TIBC, IRON, RETICCTPCT in the last 72 hours. Urinalysis    Component Value Date/Time   COLORURINE YELLOW 10/14/2020 1500   APPEARANCEUR TURBID (A) 10/14/2020 1500   LABSPEC 1.018 10/14/2020 1500   PHURINE 6.0 10/14/2020 1500   GLUCOSEU NEGATIVE 10/14/2020 1500   HGBUR SMALL (A) 10/14/2020 1500   BILIRUBINUR NEGATIVE 10/14/2020 1500   BILIRUBINUR negative 01/24/2020 1749   BILIRUBINUR negative 04/01/2018 1148   KETONESUR NEGATIVE 10/14/2020 1500   PROTEINUR 100 (A) 10/14/2020 1500  UROBILINOGEN 0.2 01/24/2020 1749   UROBILINOGEN 1.0 04/27/2012 0019   NITRITE NEGATIVE 10/14/2020 1500   LEUKOCYTESUR LARGE (A) 10/14/2020 1500   Sepsis Labs Invalid input(s): PROCALCITONIN,  WBC,  LACTICIDVEN Microbiology Recent Results (from the past 240 hour(s))  Urine Culture     Status: Abnormal   Collection Time: 10/14/20  3:00 PM   Specimen: Urine, Clean Catch  Result Value Ref Range Status   Specimen Description   Final    URINE, CLEAN CATCH Performed at [facility], [place] [address], [place], [place] [address]    Special Requests   Final    NONE Performed at [facility], [place] [address], [place], [place] [address]    Culture >=100,000 COLONIES/mL STAPHYLOCOCCUS SIMULANS  (A)  Final   Report Status 10/18/2020 FINAL  Final   Organism ID, Bacteria STAPHYLOCOCCUS SIMULANS (A)  Final      Susceptibility   Staphylococcus simulans - MIC*    CIPROFLOXACIN <=0.5 SENSITIVE Sensitive     GENTAMICIN <=0.5 SENSITIVE Sensitive     NITROFURANTOIN <=16 SENSITIVE Sensitive     OXACILLIN <=0.25 SENSITIVE Sensitive     TETRACYCLINE <=1 SENSITIVE Sensitive     VANCOMYCIN <=0.5 SENSITIVE Sensitive     TRIMETH/SULFA <=10 SENSITIVE Sensitive     CLINDAMYCIN RESISTANT Resistant     RIFAMPIN <=0.5 SENSITIVE Sensitive     Inducible Clindamycin POSITIVE Resistant     * >=100,000 COLONIES/mL STAPHYLOCOCCUS SIMULANS  Blood culture (routine single)     Status: None   Collection Time: 10/14/20  4:56 PM   Specimen: BLOOD  Result Value Ref Range Status   Specimen Description   Final    BLOOD BLOOD RIGHT FOREARM Performed at [facility] [address] [place],  [address]    Special Requests   Final    BOTTLES DRAWN AEROBIC AND ANAEROBIC Blood Culture adequate volume Performed at [facility] [address] [place],  [address]    Culture   Final    NO GROWTH 5 DAYS Performed at [facility], [place] [address], [place],  [address]    Report Status 10/19/2020 FINAL  Final  Resp Panel by RT-PCR (Flu A&B, Covid) Nasopharyngeal Swab     Status: None   Collection Time: 10/14/20  5:04 PM   Specimen: Nasopharyngeal Swab; Nasopharyngeal(NP) swabs in vial transport medium  Result Value Ref Range Status   SARS Coronavirus 2 by RT PCR NEGATIVE NEGATIVE Final    Comment: (NOTE) SARS-CoV-2 target nucleic acids are NOT DETECTED.  The SARS-CoV-2 RNA is generally detectable in upper respiratory specimens during the acute phase of infection. The lowest concentration of SARS-CoV-2 viral copies this assay can detect is 138 copies/mL. A negative result does not preclude SARS-Cov-2 infection and should not be used as the sole  basis for treatment or other patient management decisions. A negative result may occur with  improper specimen collection/handling, submission of specimen other than nasopharyngeal swab, presence of viral mutation(s) within the areas targeted by this assay, and inadequate number of viral copies(<138 copies/mL). A negative result must be combined with clinical observations, patient history, and epidemiological information. The expected result is Negative.  Fact Sheet for Patients:  EntrepreneurPulse.com.au  Fact Sheet for Healthcare Providers:  IncredibleEmployment.be  This test is no t yet approved or cleared by the [place] FDA and  has been authorized for detection and/or diagnosis of SARS-CoV-2 by FDA under an Emergency Use Authorization (EUA). This  EUA will remain  in effect (meaning this test can be used) for the duration of the COVID-19 declaration under Section 564(b)(1) of the Act, 21 U.S.C.section 360bbb-3(b)(1), unless the authorization is terminated  or revoked sooner.       Influenza A by PCR NEGATIVE NEGATIVE Final   Influenza B by PCR NEGATIVE NEGATIVE Final    Comment: (NOTE) The Xpert Xpress SARS-CoV-2/FLU/RSV plus assay is intended as an aid in the diagnosis of influenza from Nasopharyngeal swab specimens and should not be used as a sole basis for treatment. Nasal washings and aspirates are unacceptable for Xpert Xpress SARS-CoV-2/FLU/RSV testing.  Fact Sheet for Patients: EntrepreneurPulse.com.au  Fact Sheet for Healthcare Providers: IncredibleEmployment.be  This test is not yet approved or cleared by the [place] FDA and has been authorized for detection and/or diagnosis of SARS-CoV-2 by FDA under an Emergency Use Authorization (EUA). This EUA will remain in effect (meaning this test can be used) for the duration of the COVID-19 declaration under Section 564(b)(1) of the Act,  21 U.S.C. section 360bbb-3(b)(1), unless the authorization is terminated or revoked.  Performed at [facility], [place] [address] [place], [place] [address]   Culture, blood (single)     Status: None   Collection Time: 10/14/20  5:05 PM   Specimen: BLOOD  Result Value Ref Range Status   Specimen Description BLOOD SITE NOT SPECIFIED  Final   Special Requests   Final    BOTTLES DRAWN AEROBIC AND ANAEROBIC Blood Culture adequate volume   Culture   Final    NO GROWTH 5 DAYS Performed at [facility], [address], [place], [place] [address]    Report Status 10/19/2020 FINAL  Final  SARS CORONAVIRUS 2 (TAT 6-24 HRS) Nasopharyngeal Nasopharyngeal Swab     Status: None   Collection Time: 10/19/20  2:20 PM   Specimen: Nasopharyngeal Swab  Result Value Ref Range Status   SARS Coronavirus 2 NEGATIVE NEGATIVE Final    Comment: (NOTE) SARS-CoV-2 target nucleic acids are NOT DETECTED.  The SARS-CoV-2 RNA is generally detectable in upper and lower respiratory specimens during the acute phase of infection. Negative results do not preclude SARS-CoV-2 infection, do not rule out co-infections with other pathogens, and should not be used as the sole basis for treatment or other patient management decisions. Negative results must be combined with clinical observations, patient history, and epidemiological information. The expected result is Negative.  Fact Sheet for Patients: SugarRoll.be  Fact Sheet for Healthcare Providers: https://www.woods-mathews.com/  This test is not yet approved or cleared by the [place] FDA and  has been authorized for detection and/or diagnosis of SARS-CoV-2 by FDA under an Emergency Use Authorization (EUA). This EUA will remain  in effect (meaning this test can be used) for the duration of the COVID-19 declaration under Se ction 564(b)(1) of the Act, 21 U.S.C. section 360bbb-3(b)(1), unless  the authorization is terminated or revoked sooner.  Performed at [facility], [place] [address] [place], [place] [address]    Time coordinating discharge: 35 minutes  SIGNED:  Kerney Elbe, DO [facility] 10/20/2020, 9:17 AM Pager is on [place]  If 7PM-7AM, please contact night-coverage [email or web address]

## 2020-10-20 NOTE — Progress Notes (Signed)
Physical Therapy Treatment Patient Details Name: Chad Russell MRN: [id number] DOB: 1926/03/02 Today's Date: 10/20/2020    History of Present Illness Patient is a 87 year old male who presented with weakness and poor oral intake and found to have UTI. PMH: Parkinson's disease, A. fib on anticoagulation, complete heart block for > 20 years with no indication for pacemaker, CAD, hypertension, OSA    PT Comments    Patient progressing to some in room ambulation this session.  He was slow, but able to participate in getting up to EOB and scooting forward.  Initial standing demonstrates posterior bias, but improved with ambulation.  Feel he can continue to benefit from skilled PT during acute stay and from follow up SNF level rehab at d/c.    Follow Up Recommendations  SNF     Equipment Recommendations  None recommended by PT    Recommendations for Other Services       Precautions / Restrictions Precautions Precautions: Fall Precaution Comments: baseline bradycardia per pt    Mobility  Bed Mobility Overal bed mobility: Needs Assistance Bed Mobility: Supine to Sit     Supine to sit: Min assist;HOB elevated     General bed mobility comments: slow to move legs, assist for trunk, then cues for hands on bed, increased time and min A to scoot to EOB    Transfers Overall transfer level: Needs assistance Equipment used: Rolling walker (2 wheeled)   Sit to Stand: Min assist;Mod assist;From elevated surface         General transfer comment: increased time, assist for balance, pt initially stood from chair then posterior bias so sat back on chair, rose second time and A and cues for anterior weight shift  Ambulation/Gait Ambulation/Gait assistance: Min assist Gait Distance (Feet): 4 Feet Assistive device: Rolling walker (2 wheeled) Gait Pattern/deviations: Step-to pattern;Decreased stride length;Narrow base of support;Trunk flexed     General Gait Details: increased time,  ambulated partway to door, then pt requesting to sit due to weakness   Stairs             Wheelchair Mobility    Modified Rankin (Stroke Patients Only)       Balance Overall balance assessment: Needs assistance Sitting-balance support: No upper extremity supported Sitting balance-Leahy Scale: Fair     Standing balance support: Bilateral upper extremity supported Standing balance-Leahy Scale: Poor Standing balance comment: posterior bias, UE support and A for anterior weight shift                            Cognition Arousal/Alertness: Awake/alert Behavior During Therapy: WFL for tasks assessed/performed Overall Cognitive Status: Within Functional Limits for tasks assessed                                        Exercises General Exercises - Lower Extremity Hip ABduction/ADduction: Strengthening;Both;10 reps;Seated Hip Flexion/Marching: Strengthening;Both;10 reps;Seated Toe Raises: Strengthening;Both;15 reps;Seated    General Comments General comments (skin integrity, edema, etc.): HR 35 initially      Pertinent Vitals/Pain Pain Assessment: No/denies pain    Home Living                      Prior Function            PT Goals (current goals can now be found in the care plan section)  Progress towards PT goals: Progressing toward goals    Frequency    Min 3X/week      PT Plan Current plan remains appropriate    Co-evaluation              AM-PAC PT "6 Clicks" Mobility   Outcome Measure  Help needed turning from your back to your side while in a flat bed without using bedrails?: A Little Help needed moving from lying on your back to sitting on the side of a flat bed without using bedrails?: A Lot Help needed moving to and from a bed to a chair (including a wheelchair)?: A Little Help needed standing up from a chair using your arms (e.g., wheelchair or bedside chair)?: A Lot Help needed to walk in hospital  room?: A Little Help needed climbing 3-5 steps with a railing? : A Lot 6 Click Score: 15    End of Session   Activity Tolerance: Patient tolerated treatment well Patient left: in chair;with call bell/phone within reach;with chair alarm set   PT Visit Diagnosis: Other abnormalities of gait and mobility (R26.89);Muscle weakness (generalized) (M62.81)     Time: [id number] PT Time Calculation (min) (ACUTE ONLY): 24 min  Charges:  $Gait Training: 8-22 mins $Therapeutic Exercise: 8-22 mins                     Magda Kiel, PT Acute Rehabilitation Services Pager:[phone] Office:[phone] 10/20/2020    Reginia Naas 10/20/2020, 10:58 AM

## 2020-10-20 NOTE — TOC Progression Note (Addendum)
Transition of Care [facility]) - Progression Note    Patient Details  Name: Chad Russell MRN: [id number] Date of Birth: [birth year]  [facility] CM/SW Contact  Purcell Mouton, RN Phone Number: 10/20/2020, 3:45 PM  Clinical Narrative:    Pt's wife selected [facility]. Admission Coordinator accepted and will start authorization from insurance co. SNF asked for 2nd booster for COVID to be given here. Pt's 3rd COVID Booster was given at [facility] on [address]. Which was confirmed by calling and a fax from pharmacy on 12/18/2019, placed in pt's chart.    Expected Discharge Plan: [place] Barriers to Discharge: Continued Medical Work up  Expected Discharge Plan and Services Expected Discharge Plan: [place]   Discharge Planning Services: CM Consult   Living arrangements for the past 2 months: Single Family Home Expected Discharge Date: 10/20/20                                     Social Determinants of Health (SDOH) Interventions    Readmission Risk Interventions No flowsheet data found.

## 2020-10-20 NOTE — Plan of Care (Signed)
  Problem: Education: Goal: Knowledge of General Education information will improve Description: Including pain rating scale, medication(s)/side effects and non-pharmacologic comfort measures Outcome: Progressing   Problem: Health Behavior/Discharge Planning: Goal: Ability to manage health-related needs will improve Outcome: Progressing   Problem: Clinical Measurements: Goal: Ability to maintain clinical measurements within normal limits will improve Outcome: Progressing Goal: Will remain free from infection Outcome: Progressing Goal: Diagnostic test results will improve Outcome: Progressing Goal: Respiratory complications will improve Outcome: Progressing Goal: Cardiovascular complication will be avoided Outcome: Progressing   Problem: Activity: Goal: Risk for activity intolerance will decrease Outcome: Progressing   Problem: Nutrition: Goal: Adequate nutrition will be maintained Outcome: Progressing   Problem: Elimination: Goal: Will not experience complications related to urinary retention Outcome: Progressing   Problem: Safety: Goal: Ability to remain free from injury will improve Outcome: Progressing   Problem: Skin Integrity: Goal: Risk for impaired skin integrity will decrease Outcome: Progressing

## 2020-10-21 DIAGNOSIS — G20 Parkinson's disease: Secondary | ICD-10-CM | POA: Diagnosis not present

## 2020-10-21 DIAGNOSIS — F0150 Vascular dementia without behavioral disturbance: Secondary | ICD-10-CM | POA: Diagnosis not present

## 2020-10-21 DIAGNOSIS — Z9181 History of falling: Secondary | ICD-10-CM | POA: Diagnosis not present

## 2020-10-21 DIAGNOSIS — N400 Enlarged prostate without lower urinary tract symptoms: Secondary | ICD-10-CM | POA: Diagnosis not present

## 2020-10-21 DIAGNOSIS — R2689 Other abnormalities of gait and mobility: Secondary | ICD-10-CM | POA: Diagnosis not present

## 2020-10-21 DIAGNOSIS — I480 Paroxysmal atrial fibrillation: Secondary | ICD-10-CM | POA: Diagnosis not present

## 2020-10-21 DIAGNOSIS — M6281 Muscle weakness (generalized): Secondary | ICD-10-CM | POA: Diagnosis not present

## 2020-10-21 DIAGNOSIS — R499 Unspecified voice and resonance disorder: Secondary | ICD-10-CM | POA: Diagnosis not present

## 2020-10-21 DIAGNOSIS — I952 Hypotension due to drugs: Secondary | ICD-10-CM | POA: Diagnosis not present

## 2020-10-21 DIAGNOSIS — H34811 Central retinal vein occlusion, right eye, with macular edema: Secondary | ICD-10-CM | POA: Diagnosis not present

## 2020-10-21 DIAGNOSIS — R001 Bradycardia, unspecified: Secondary | ICD-10-CM | POA: Diagnosis not present

## 2020-10-21 DIAGNOSIS — N179 Acute kidney failure, unspecified: Secondary | ICD-10-CM | POA: Diagnosis not present

## 2020-10-21 DIAGNOSIS — I482 Chronic atrial fibrillation, unspecified: Secondary | ICD-10-CM | POA: Diagnosis not present

## 2020-10-21 DIAGNOSIS — I442 Atrioventricular block, complete: Secondary | ICD-10-CM | POA: Diagnosis not present

## 2020-10-21 DIAGNOSIS — K219 Gastro-esophageal reflux disease without esophagitis: Secondary | ICD-10-CM | POA: Diagnosis not present

## 2020-10-21 DIAGNOSIS — H348111 Central retinal vein occlusion, right eye, with retinal neovascularization: Secondary | ICD-10-CM | POA: Diagnosis not present

## 2020-10-21 DIAGNOSIS — R531 Weakness: Secondary | ICD-10-CM | POA: Diagnosis not present

## 2020-10-21 DIAGNOSIS — N390 Urinary tract infection, site not specified: Secondary | ICD-10-CM | POA: Diagnosis not present

## 2020-10-21 DIAGNOSIS — M80052D Age-related osteoporosis with current pathological fracture, left femur, subsequent encounter for fracture with routine healing: Secondary | ICD-10-CM | POA: Diagnosis not present

## 2020-10-21 DIAGNOSIS — H353221 Exudative age-related macular degeneration, left eye, with active choroidal neovascularization: Secondary | ICD-10-CM | POA: Diagnosis not present

## 2020-10-21 DIAGNOSIS — R0902 Hypoxemia: Secondary | ICD-10-CM | POA: Diagnosis not present

## 2020-10-21 DIAGNOSIS — I10 Essential (primary) hypertension: Secondary | ICD-10-CM | POA: Diagnosis not present

## 2020-10-21 DIAGNOSIS — D649 Anemia, unspecified: Secondary | ICD-10-CM | POA: Diagnosis not present

## 2020-10-21 DIAGNOSIS — Z7401 Bed confinement status: Secondary | ICD-10-CM | POA: Diagnosis not present

## 2020-10-21 DIAGNOSIS — I459 Conduction disorder, unspecified: Secondary | ICD-10-CM | POA: Diagnosis not present

## 2020-10-21 DIAGNOSIS — R1310 Dysphagia, unspecified: Secondary | ICD-10-CM | POA: Diagnosis not present

## 2020-10-21 DIAGNOSIS — R609 Edema, unspecified: Secondary | ICD-10-CM | POA: Diagnosis not present

## 2020-10-21 DIAGNOSIS — N3000 Acute cystitis without hematuria: Secondary | ICD-10-CM | POA: Diagnosis not present

## 2020-10-21 MED ORDER — GUAIFENESIN ER 600 MG PO TB12
600.0000 mg | ORAL_TABLET | Freq: Two times a day (BID) | ORAL | Status: DC
  Administered 2020-10-21: 600 mg via ORAL
  Filled 2020-10-21: qty 1

## 2020-10-21 MED ORDER — GUAIFENESIN ER 600 MG PO TB12: 600.0000 mg | ORAL_TABLET | Freq: Two times a day (BID) | ORAL | 0 refills | Status: AC

## 2020-10-21 NOTE — Plan of Care (Signed)
  Problem: Clinical Measurements: Goal: Respiratory complications will improve Outcome: Progressing Goal: Cardiovascular complication will be avoided Outcome: Progressing   Problem: Activity: Goal: Risk for activity intolerance will decrease Outcome: Progressing   Problem: Nutrition: Goal: Adequate nutrition will be maintained Outcome: Progressing   

## 2020-10-21 NOTE — Progress Notes (Signed)
Provided discharge instructions and placed in packet. IV removed intact.  Chad Russell

## 2020-10-21 NOTE — Discharge Summary (Signed)
Physician Discharge Summary  BUTLER KEYE [id number] DOB: 02-Feb-1927 DOA: 10/14/2020  PCP: [name], MD  Admit date: 10/14/2020 Discharge date: 10/21/2020  Admitted From: Home Disposition: SNF  Recommendations for Outpatient Follow-up:  Follow up with PCP in 1-2 weeks Follow up with Cardiology Dr. Percival Spanish within 1-2 weeks Please obtain CMP/CBC, Mag, Phos in one week Please follow up on the following pending results:  Home Health: No Equipment/Devices: None  Discharge Condition: Stable CODE STATUS: FULL CODE Diet recommendation: Heart Healthy Diet   Brief/Interim Summary: The patient is a 87 year old Caucasian elderly male with a past medical history significant for but not limited to Parkinson's disease on Sinemet, atrial fibrillation on anticoagulation, history of complete heart block for greater than 20 years with no indication of recurrent pacemaker, CAD, hypertension, OSA as well as other comorbidities who presented with a chief complaint of generalized weakness.  He states that he has been feeling weak over the last 5 days and he states is gotten weaker significantly and endorsed poor oral intake.  He denies any falls but does endorse urinary frequency and urgency.  Denies any dysuria and denies any nausea vomiting abdominal pain, chest pain or shortness of breath or any syncopal episodes.  He presented the ED for generalized weakness in the ED is found to be bradycardic with a heart rate in the 30s but not hypotensive.  Labs showed no leukocytosis.  UA showed a large amount of leukocytes and there are greater than 50 WBCs and many bacteria.  He is empirically started on antibiotics for a suspected UTI blood cultures and urine culture obtained.  He is currently being admitted and treated for UTI with IV ceftriaxone and PT OT evaluated and recommending SNF.   The night before last the patient became nauseous and started vomiting and this was was abated with Antiemetics.   Sensitivities are now back for Staphylococcus simulans and case was discussed with infectious diseases Dr. Linus Salmons who recommends transitioning the patient to p.o. Keflex 500 mg 3 times daily.  He is deemed medically stable to be discharged to SNF at this time and awaiting bed availability   ADDENDUM 10/21/20: Patient was deemed medically stable to be D/C'd to SNF however did not go due to lack of Ship broker. Patient's Wife has selected [facility] and he was given his 2nd COVID booster. He remains medically stable to D/C and had no acute events overnight and will discharge once Insurance Authorization has been obtained.    Discharge Diagnoses:  Principal Problem:   UTI (urinary tract infection) Active Problems:   Complete heart block (HCC)   HTN (hypertension)   AKI (acute kidney injury) (HCC)   Bradycardia  Acute Staphylococcus Simulans UTI, present on admission -His lactate was elevated 2.1 and repeat was 2.1 but he had no fevers, leukocytosis, tachycardia or hypotension to suggest sepsis; WBC is now 9.0 -Urinalysis showed a turbid appearance with small hemoglobin, large leukocytes, negative nitrites, many bacteria, 21-50 RBCs per high-power field, and greater than 50 WBCs -Urine culture growing Staphyloccous Simulans with Sensitivities Showing only resistance to Clindamycin   -Blood cultures obtained and showed NGTD at 5 Days  -Empirically started on IV ceftriaxone this was changed to p.o. Keflex 500 mg po TID after discussion with Infectious Disease and after sensitivities have -PT OT evaluated and recommending SNF given that he needs at least moderate assistance as well as given that he fatigues easily   AKI on CKD Stage 3a, present on admission -In the setting  of prerenal azotemia likely from dehydration -IVF hydration now stopped  -Patient's BUN/Cr went from 70/1.95 -> 48/1.22 -> 40/1.08 -> 32/1.14 -> 31/0.96 -> 28/1.09 on last check  -Currently holding his home Lisinopril  and Hydrochlorothiazide but will resume at D/C  -Avoiding further nephrotoxic medications, contrast dyes, hypotension renally adjusted medications -Continue monitor and trend renal function carefully and repeat CMP in the a.m.   Bradycardia/complete heart block -He has had a heart rate in the 30s but he is asymptomatic and his blood pressure stable -This has been a chronic issue for several years and has been seen by Dr. Percival Spanish in outpatient setting and the decision was made not to put a pacemaker in as he remained asymptomatic with his -Continue with telemetry monitoring and continue to avoid AV nodal blocking agents -HR ranging in the High 20's to Mid 30's when sleeping; HR in the 30's again  -Follow up with Cardiology Dr. Percival Spanish in the outpatient setting    Chronic Atrial fibrillation -Continue with telemetry monitoring -CHA2DS2-VASc score 3 -Continue with anticoagulation with Xarelto  Acquired Thrombophilia -In the Setting of Chronic Atrial Fibrillation -C/w Anticoagulation as above    Hypertension -Currently stable -Holding his home hydrochlorothiazide and lisinopril in the setting of AKI but will resume now  -Continue monitor blood pressures per protocol -Last blood pressure reading was elevated to 140/69   Hypophosphatemia -Patient's Phos Level was 2.2 and improved to 2.7 after repleteion; Has not been rechecked today  -Continue to Monitor and Replete as Necessary -Repeat Phos Level in the outpatient setting    Nausea and Vomiting  -Vomited the night before last but not last night  -C/w Supportive Care -C/w Antiemetics with Ondansetron 4 mg IV q8hprn Nausea   Generalized Weakness -In the setting of infection as above -Placed on fall precautions -PT OT evaluating and recommending SNF -Continue IV fluid hydration as above and received 2 L boluses; Maintenance IVF now stopped  -Nutritionist evaluated and recommending boost breeze once a day, Ensure Plus once a day,  multivitamin with minerals daily and Magic cup with lunch meals and they are going to complete a NFPE when feasible   Parkinson's Disease -Continue with Carbidopa-Levodopa 25-100 mg 1.5 tablets p.o. twice daily -C/w Delirium Precautions    Macrocytic Anemia -Patient's hemoglobin/hematocrit went from 10.7/33.5 and is now 11.5/36.1 -> 10.3/32.0 -> 10.4/32.4 -> 9.8/30.0 -> 10.0/30.7 on last check -Checked Anemia Panel and showed an iron level of 55, U IBC 157, TIBC of 212, saturation ratios of 26%, ferritin level of 135, folate level 12.2, and vitamin B12 level 746 -Continue to monitor for signs and symptoms of bleeding; currently no overt bleeding noted -Repeat CBC within 1 week   Discharge Instructions Discharge Instructions     Call MD for:  difficulty breathing, headache or visual disturbances   Complete by: As directed    Call MD for:  extreme fatigue   Complete by: As directed    Call MD for:  hives   Complete by: As directed    Call MD for:  persistant dizziness or light-headedness   Complete by: As directed    Call MD for:  persistant nausea and vomiting   Complete by: As directed    Call MD for:  redness, tenderness, or signs of infection (pain, swelling, redness, odor or green/yellow discharge around incision site)   Complete by: As directed    Call MD for:  severe uncontrolled pain   Complete by: As directed    Call MD  for:  temperature >100.4   Complete by: As directed    Diet - low sodium heart healthy   Complete by: As directed    Discharge instructions   Complete by: As directed    You were cared for by a hospitalist during your hospital stay. If you have any questions about your discharge medications or the care you received while you were in the hospital after you are discharged, you can call the unit and ask to speak with the hospitalist on call if the hospitalist that took care of you is not available. Once you are discharged, your primary care physician will handle  any further medical issues. Please note that NO REFILLS for any discharge medications will be authorized once you are discharged, as it is imperative that you return to your primary care physician (or establish a relationship with a primary care physician if you do not have one) for your aftercare needs so that they can reassess your need for medications and monitor your lab values.  Follow up with PCP and Cardiology within 1-2 weeks. Take all medications as prescribed. If symptoms change or worsen please return to the ED for evaluation   Increase activity slowly   Complete by: As directed       Allergies as of 10/21/2020       Reactions   Doxycycline Other (See Comments)   Upset stomach   Keflex [cephalexin] Other (See Comments)   Abdominal discomfort 04-19-17 Pt has tolerated orally        Medication List     STOP taking these medications    bisacodyl 5 MG EC tablet Commonly known as: DULCOLAX       TAKE these medications    acetaminophen 500 MG tablet Commonly known as: TYLENOL Take 1 tablet (500 mg total) by mouth every 8 (eight) hours. What changed:  when to take this reasons to take this   calcium carbonate 500 MG chewable tablet Commonly known as: TUMS - dosed in mg elemental calcium Chew 1 tablet (200 mg of elemental calcium total) by mouth 2 (two) times daily with a meal.   carbidopa-levodopa 25-100 MG tablet Commonly known as: Sinemet Take 1.5 tablets by mouth in the morning and at bedtime.   cephALEXin 500 MG capsule Commonly known as: KEFLEX Take 1 capsule (500 mg total) by mouth every 8 (eight) hours for 2 days.   docusate sodium 100 MG capsule Commonly known as: COLACE Take 200 mg by mouth daily as needed for mild constipation.   feeding supplement Liqd Take 237 mLs by mouth daily.   lisinopril-hydrochlorothiazide 10-12.5 MG tablet Commonly known as: ZESTORETIC TAKE ONE TABLET BY MOUTH DAILY   PRESERVISION AREDS 2+MULTI VIT PO Take 1 capsule  by mouth 2 (two) times daily.   Prostate Caps Take 1 tablet by mouth 2 (two) times daily. Prostate Plus otc supplement   rivaroxaban 20 MG Tabs tablet Commonly known as: Xarelto TAKE ONE TABLET BY MOUTH DAILY WITH SUPPER What changed:  how much to take how to take this when to take this additional instructions   senna 8.6 MG Tabs tablet Commonly known as: SENOKOT Take 1 tablet by mouth daily as needed for mild constipation.   timolol 0.5 % ophthalmic solution Commonly known as: TIMOPTIC INSTILL ONE DROP IN [facility] EYE DAILY What changed:  how much to take how to take this when to take this additional instructions   vitamin C 500 MG tablet Commonly known as: ASCORBIC ACID Take 500  mg by mouth daily.        Follow-up Information     [name], MD. Call.   Specialties: Family Medicine, Sports Medicine Why: Follow up within 1-2 weeks Contact information: [place] [id number] [phone]         Minus Breeding, MD Follow up.   Specialty: Cardiology Why: Follow up within 1-2 weeks Contact information: [address] [place] [address] [phone]                Allergies  Allergen Reactions   Doxycycline Other (See Comments)    Upset stomach   Keflex [Cephalexin] Other (See Comments)    Abdominal discomfort 04-19-17 Pt has tolerated orally   Consultations: None  Procedures/Studies: No results found.  Subjective: Seen and examined in bedside he was doing well.  Denied any chest pain or shortness of breath.  States that he had a good night.  No other concerns or complaints at this time is currently awaiting bed availability to go for rehab.  Discharge Exam: Vitals:   10/21/20 0400 10/21/20 0500  BP:  140/69  Pulse:  (!) 33  Resp: 17 18  Temp:  98.7 F (37.1 C)  SpO2:     Vitals:   10/20/20 2135 10/21/20 0000 10/21/20 0400 10/21/20 0500  BP: (!) 123/59   140/69  Pulse: (!) 32   (!) 33  Resp: '20  15 17 18  '$ Temp: 98.3 F (36.8 C)   98.7 F (37.1 C)  TempSrc: Oral   Oral  SpO2: 98%     Weight:      Height:       General: Pt is alert, awake, not in acute distress Cardiovascular: Bradycardic rate but regular rhythm, S1/S2 +, no rubs, no gallops Respiratory: Diminished bilaterally, no wheezing, no rhonchi; unlabored breathing Abdominal: Soft, NT, ND, bowel sounds + Extremities: no appreciable edema, no cyanosis  The results of significant diagnostics from this hospitalization (including imaging, microbiology, ancillary and laboratory) are listed below for reference.    Microbiology: Recent Results (from the past 240 hour(s))  Urine Culture     Status: Abnormal   Collection Time: 10/14/20  3:00 PM   Specimen: Urine, Clean Catch  Result Value Ref Range Status   Specimen Description   Final    URINE, CLEAN CATCH Performed at [facility], [place] [address] [place], [place] [address]    Special Requests   Final    NONE Performed at [facility], [place] [address], [place], [place] [address]    Culture >=100,000 COLONIES/mL STAPHYLOCOCCUS SIMULANS (A)  Final   Report Status 10/18/2020 FINAL  Final   Organism ID, Bacteria STAPHYLOCOCCUS SIMULANS (A)  Final      Susceptibility   Staphylococcus simulans - MIC*    CIPROFLOXACIN <=0.5 SENSITIVE Sensitive     GENTAMICIN <=0.5 SENSITIVE Sensitive     NITROFURANTOIN <=16 SENSITIVE Sensitive     OXACILLIN <=0.25 SENSITIVE Sensitive     TETRACYCLINE <=1 SENSITIVE Sensitive     VANCOMYCIN <=0.5 SENSITIVE Sensitive     TRIMETH/SULFA <=10 SENSITIVE Sensitive     CLINDAMYCIN RESISTANT Resistant     RIFAMPIN <=0.5 SENSITIVE Sensitive     Inducible Clindamycin POSITIVE Resistant     * >=100,000 COLONIES/mL STAPHYLOCOCCUS SIMULANS  Blood culture (routine single)     Status: None   Collection Time: 10/14/20  4:56 PM   Specimen: BLOOD  Result Value Ref Range Status  Specimen Description   Final     BLOOD BLOOD RIGHT FOREARM Performed at [place] [address] [place], [place] [address]    Special Requests   Final    BOTTLES DRAWN AEROBIC AND ANAEROBIC Blood Culture adequate volume Performed at [place] [address], [place], [place] [address]    Culture   Final    NO GROWTH 5 DAYS Performed at [facility], [place] [address], [place], [place] [address]    Report Status 10/19/2020 FINAL  Final  Resp Panel by RT-PCR (Flu A&B, Covid) Nasopharyngeal Swab     Status: None   Collection Time: 10/14/20  5:04 PM   Specimen: Nasopharyngeal Swab; Nasopharyngeal(NP) swabs in vial transport medium  Result Value Ref Range Status   SARS Coronavirus 2 by RT PCR NEGATIVE NEGATIVE Final    Comment: (NOTE) SARS-CoV-2 target nucleic acids are NOT DETECTED.  The SARS-CoV-2 RNA is generally detectable in upper respiratory specimens during the acute phase of infection. The lowest concentration of SARS-CoV-2 viral copies this assay can detect is 138 copies/mL. A negative result does not preclude SARS-Cov-2 infection and should not be used as the sole basis for treatment or other patient management decisions. A negative result may occur with  improper specimen collection/handling, submission of specimen other than nasopharyngeal swab, presence of viral mutation(s) within the areas targeted by this assay, and inadequate number of viral copies(<138 copies/mL). A negative result must be combined with clinical observations, patient history, and epidemiological information. The expected result is Negative.  Fact Sheet for Patients:  EntrepreneurPulse.com.au  Fact Sheet for Healthcare Providers:  IncredibleEmployment.be  This test is no t yet approved or cleared by the [place] FDA and  has been authorized for detection and/or diagnosis of SARS-CoV-2 by FDA under an Emergency Use Authorization (EUA). This  EUA will remain  in effect (meaning this test can be used) for the duration of the COVID-19 declaration under Section 564(b)(1) of the Act, 21 U.S.C.section 360bbb-3(b)(1), unless the authorization is terminated  or revoked sooner.       Influenza A by PCR NEGATIVE NEGATIVE Final   Influenza B by PCR NEGATIVE NEGATIVE Final    Comment: (NOTE) The Xpert Xpress SARS-CoV-2/FLU/RSV plus assay is intended as an aid in the diagnosis of influenza from Nasopharyngeal swab specimens and should not be used as a sole basis for treatment. Nasal washings and aspirates are unacceptable for Xpert Xpress SARS-CoV-2/FLU/RSV testing.  Fact Sheet for Patients: EntrepreneurPulse.com.au  Fact Sheet for Healthcare Providers: IncredibleEmployment.be  This test is not yet approved or cleared by the [place] FDA and has been authorized for detection and/or diagnosis of SARS-CoV-2 by FDA under an Emergency Use Authorization (EUA). This EUA will remain in effect (meaning this test can be used) for the duration of the COVID-19 declaration under Section 564(b)(1) of the Act, 21 U.S.C. section 360bbb-3(b)(1), unless the authorization is terminated or revoked.  Performed at [facility], [place] [address] [place], [place] [address]   Culture, blood (single)     Status: None   Collection Time: 10/14/20  5:05 PM   Specimen: BLOOD  Result Value Ref Range Status   Specimen Description BLOOD SITE NOT SPECIFIED  Final   Special Requests   Final    BOTTLES DRAWN AEROBIC AND ANAEROBIC Blood Culture adequate volume   Culture   Final    NO GROWTH 5 DAYS Performed at [facility], [address] [place], [place] [address]    Report Status 10/19/2020 FINAL  Final  SARS CORONAVIRUS 2 (TAT 6-24 HRS) Nasopharyngeal Nasopharyngeal Swab     Status: None   Collection Time: 10/19/20  2:20 PM   Specimen: Nasopharyngeal Swab  Result Value Ref Range Status    SARS Coronavirus 2 NEGATIVE NEGATIVE Final    Comment: (NOTE) SARS-CoV-2 target nucleic acids are NOT DETECTED.  The SARS-CoV-2 RNA is generally detectable in upper and lower respiratory specimens during the acute phase of infection. Negative results do not preclude SARS-CoV-2 infection, do not rule out co-infections with other pathogens, and should not be used as the sole basis for treatment or other patient management decisions. Negative results must be combined with clinical observations, patient history, and epidemiological information. The expected result is Negative.  Fact Sheet for Patients: SugarRoll.be  Fact Sheet for Healthcare Providers: https://www.woods-mathews.com/  This test is not yet approved or cleared by the [place] FDA and  has been authorized for detection and/or diagnosis of SARS-CoV-2 by FDA under an Emergency Use Authorization (EUA). This EUA will remain  in effect (meaning this test can be used) for the duration of the COVID-19 declaration under Se ction 564(b)(1) of the Act, 21 U.S.C. section 360bbb-3(b)(1), unless the authorization is terminated or revoked sooner.  Performed at [facility], [place] [address] [place], [place] [address]     Labs: BNP (last 3 results) No results for input(s): BNP in the last 8760 hours. Basic Metabolic Panel: Recent Labs  Lab 10/15/20 1002 10/16/20 0329 10/17/20 0403 10/18/20 0431 10/19/20 0411  NA 139 140 140 138 139  K 4.1 4.6 4.0 4.0 4.1  CL 108 110 110 108 108  CO2 '24 26 24 25 25  '$ GLUCOSE 91 101* 116* 87 104*  BUN 48* 40* 32* 31* 28*  CREATININE 1.22 1.08 1.14 0.96 1.09  CALCIUM 9.1 8.9 8.7* 8.7* 8.9  MG 2.0 2.0 1.9 2.0 2.0  PHOS 2.5 2.4* 2.5 2.2* 2.7    Liver Function Tests: Recent Labs  Lab 10/15/20 1002 10/16/20 0329 10/17/20 0403 10/18/20 0431 10/19/20 0411  AST '18 18 17 16 20  '$ ALT '5 6 12 '$ <5 6  ALKPHOS 52 49 53 45 47  BILITOT 0.8 0.8  0.7 0.6 0.7  PROT 6.7 6.3* 6.1* 5.5* 5.7*  ALBUMIN 3.4* 3.1* 3.0* 2.8* 2.8*    No results for input(s): LIPASE, AMYLASE in the last 168 hours. No results for input(s): AMMONIA in the last 168 hours. CBC: Recent Labs  Lab 10/15/20 1002 10/16/20 0329 10/17/20 0403 10/18/20 0431 10/19/20 0411  WBC 7.0 6.5 9.0 6.4 7.9  NEUTROABS 4.4 4.0 4.5 3.8 5.2  HGB 11.5* 10.3* 10.4* 9.8* 10.0*  HCT 36.1* 32.0* 32.4* 30.0* 30.7*  MCV 100.6* 100.0 99.1 98.7 98.7  PLT 202 182 199 196 200    Cardiac Enzymes: No results for input(s): CKTOTAL, CKMB, CKMBINDEX, TROPONINI in the last 168 hours. BNP: Invalid input(s): POCBNP CBG: Recent Labs  Lab 10/19/20 0806  GLUCAP 76    D-Dimer No results for input(s): DDIMER in the last 72 hours. Hgb A1c No results for input(s): HGBA1C in the last 72 hours. Lipid Profile No results for input(s): CHOL, HDL, LDLCALC, TRIG, CHOLHDL, LDLDIRECT in the last 72 hours. Thyroid function studies No results for input(s): TSH, T4TOTAL, T3FREE, THYROIDAB in the last 72 hours.  Invalid input(s): FREET3 Anemia work up No results for input(s): VITAMINB12, FOLATE, FERRITIN, TIBC, IRON, RETICCTPCT in the last 72 hours. Urinalysis  Component Value Date/Time   COLORURINE YELLOW 10/14/2020 1500   APPEARANCEUR TURBID (A) 10/14/2020 1500   LABSPEC 1.018 10/14/2020 1500   PHURINE 6.0 10/14/2020 1500   GLUCOSEU NEGATIVE 10/14/2020 1500   HGBUR SMALL (A) 10/14/2020 1500   BILIRUBINUR NEGATIVE 10/14/2020 1500   BILIRUBINUR negative 01/24/2020 1749   BILIRUBINUR negative 04/01/2018 1148   KETONESUR NEGATIVE 10/14/2020 1500   PROTEINUR 100 (A) 10/14/2020 1500   UROBILINOGEN 0.2 01/24/2020 1749   UROBILINOGEN 1.0 04/27/2012 0019   NITRITE NEGATIVE 10/14/2020 1500   LEUKOCYTESUR LARGE (A) 10/14/2020 1500   Sepsis Labs Invalid input(s): PROCALCITONIN,  WBC,  LACTICIDVEN Microbiology Recent Results (from the past 240 hour(s))  Urine Culture     Status: Abnormal    Collection Time: 10/14/20  3:00 PM   Specimen: Urine, Clean Catch  Result Value Ref Range Status   Specimen Description   Final    URINE, CLEAN CATCH Performed at [facility], [place] [address], [place], [place] [address]    Special Requests   Final    NONE Performed at [facility], [place] [address] [place], [place] [address]    Culture >=100,000 COLONIES/mL STAPHYLOCOCCUS SIMULANS (A)  Final   Report Status 10/18/2020 FINAL  Final   Organism ID, Bacteria STAPHYLOCOCCUS SIMULANS (A)  Final      Susceptibility   Staphylococcus simulans - MIC*    CIPROFLOXACIN <=0.5 SENSITIVE Sensitive     GENTAMICIN <=0.5 SENSITIVE Sensitive     NITROFURANTOIN <=16 SENSITIVE Sensitive     OXACILLIN <=0.25 SENSITIVE Sensitive     TETRACYCLINE <=1 SENSITIVE Sensitive     VANCOMYCIN <=0.5 SENSITIVE Sensitive     TRIMETH/SULFA <=10 SENSITIVE Sensitive     CLINDAMYCIN RESISTANT Resistant     RIFAMPIN <=0.5 SENSITIVE Sensitive     Inducible Clindamycin POSITIVE Resistant     * >=100,000 COLONIES/mL STAPHYLOCOCCUS SIMULANS  Blood culture (routine single)     Status: None   Collection Time: 10/14/20  4:56 PM   Specimen: BLOOD  Result Value Ref Range Status   Specimen Description   Final    BLOOD BLOOD RIGHT FOREARM Performed at [place] [address], [place], [place] [address]    Special Requests   Final    BOTTLES DRAWN AEROBIC AND ANAEROBIC Blood Culture adequate volume Performed at [place] [address] [place], [place] [address]    Culture   Final    NO GROWTH 5 DAYS Performed at [facility], [place] [address], [place], [place] [address]    Report Status 10/19/2020 FINAL  Final  Resp Panel by RT-PCR (Flu A&B, Covid) Nasopharyngeal Swab     Status: None   Collection Time: 10/14/20  5:04 PM   Specimen: Nasopharyngeal Swab; Nasopharyngeal(NP) swabs in vial transport medium  Result Value Ref Range Status    SARS Coronavirus 2 by RT PCR NEGATIVE NEGATIVE Final    Comment: (NOTE) SARS-CoV-2 target nucleic acids are NOT DETECTED.  The SARS-CoV-2 RNA is generally detectable in upper respiratory specimens during the acute phase of infection. The lowest concentration of SARS-CoV-2 viral copies this assay can detect is 138 copies/mL. A negative result does not preclude SARS-Cov-2 infection and should not be used as the sole basis for treatment or other patient management decisions. A negative result may occur with  improper specimen collection/handling, submission of specimen other than nasopharyngeal swab, presence of viral mutation(s) within the areas targeted by this assay, and inadequate  number of viral copies(<138 copies/mL). A negative result must be combined with clinical observations, patient history, and epidemiological information. The expected result is Negative.  Fact Sheet for Patients:  EntrepreneurPulse.com.au  Fact Sheet for Healthcare Providers:  IncredibleEmployment.be  This test is no t yet approved or cleared by the [place] FDA and  has been authorized for detection and/or diagnosis of SARS-CoV-2 by FDA under an Emergency Use Authorization (EUA). This EUA will remain  in effect (meaning this test can be used) for the duration of the COVID-19 declaration under Section 564(b)(1) of the Act, 21 U.S.C.section 360bbb-3(b)(1), unless the authorization is terminated  or revoked sooner.       Influenza A by PCR NEGATIVE NEGATIVE Final   Influenza B by PCR NEGATIVE NEGATIVE Final    Comment: (NOTE) The Xpert Xpress SARS-CoV-2/FLU/RSV plus assay is intended as an aid in the diagnosis of influenza from Nasopharyngeal swab specimens and should not be used as a sole basis for treatment. Nasal washings and aspirates are unacceptable for Xpert Xpress SARS-CoV-2/FLU/RSV testing.  Fact Sheet for  Patients: EntrepreneurPulse.com.au  Fact Sheet for Healthcare Providers: IncredibleEmployment.be  This test is not yet approved or cleared by the [place] FDA and has been authorized for detection and/or diagnosis of SARS-CoV-2 by FDA under an Emergency Use Authorization (EUA). This EUA will remain in effect (meaning this test can be used) for the duration of the COVID-19 declaration under Section 564(b)(1) of the Act, 21 U.S.C. section 360bbb-3(b)(1), unless the authorization is terminated or revoked.  Performed at [facility], [place] [address] [place], [place] [address]   Culture, blood (single)     Status: None   Collection Time: 10/14/20  5:05 PM   Specimen: BLOOD  Result Value Ref Range Status   Specimen Description BLOOD SITE NOT SPECIFIED  Final   Special Requests   Final    BOTTLES DRAWN AEROBIC AND ANAEROBIC Blood Culture adequate volume   Culture   Final    NO GROWTH 5 DAYS Performed at [facility], [address] [place], [place] [address]    Report Status 10/19/2020 FINAL  Final  SARS CORONAVIRUS 2 (TAT 6-24 HRS) Nasopharyngeal Nasopharyngeal Swab     Status: None   Collection Time: 10/19/20  2:20 PM   Specimen: Nasopharyngeal Swab  Result Value Ref Range Status   SARS Coronavirus 2 NEGATIVE NEGATIVE Final    Comment: (NOTE) SARS-CoV-2 target nucleic acids are NOT DETECTED.  The SARS-CoV-2 RNA is generally detectable in upper and lower respiratory specimens during the acute phase of infection. Negative results do not preclude SARS-CoV-2 infection, do not rule out co-infections with other pathogens, and should not be used as the sole basis for treatment or other patient management decisions. Negative results must be combined with clinical observations, patient history, and epidemiological information. The expected result is Negative.  Fact Sheet for  Patients: SugarRoll.be  Fact Sheet for Healthcare Providers: https://www.woods-mathews.com/  This test is not yet approved or cleared by the [place] FDA and  has been authorized for detection and/or diagnosis of SARS-CoV-2 by FDA under an Emergency Use Authorization (EUA). This EUA will remain  in effect (meaning this test can be used) for the duration of the COVID-19 declaration under Se ction 564(b)(1) of the Act, 21 U.S.C. section 360bbb-3(b)(1), unless the authorization is terminated or revoked sooner.  Performed at [facility], [place] [address] [place],  [address]    Time coordinating discharge: 35 minutes  SIGNED:  Kerney Elbe, DO [facility] 10/21/2020, 8:20 AM Pager is on AMION  If 7PM-7AM, please contact night-coverage [email or web address]

## 2020-10-23 DIAGNOSIS — N390 Urinary tract infection, site not specified: Secondary | ICD-10-CM

## 2020-10-23 HISTORY — DX: Urinary tract infection, site not specified: N39.0

## 2020-10-23 NOTE — Progress Notes (Signed)
Cardiology Office Note   Date:  10/24/2020   ID:  Mayo, Faulk Jul 17, 1926, MRN [id number]  PCP:  [name], MD  CC: Hospital Follow Up    History of Present Illness: Chad Russell is a 87 y.o. male who presents for ongoing assessment and management of complete heart block and atrial fibrillation.  He was last seen by Dr. Percival Spanish on 11/30/2019, and documented that the patient has had complete heart block for many years and the decision was made a long time ago not to place a pacemaker as he was asymptomatic.  Should he develop symptomatic bradycardia or arrhythmias in the future this still could be considered.  His heart rate was well controlled and he can was continued on anticoagulation.  Other history includes Parkinson's disease, OSA.  The patient was recently discharged on 10/21/2020 after admission for weakness. He was diagnosed with UTI, AKI, treated with IV ceftriaxone, but changed to Keflex as cultures revealed staphylococcus simulans. He was discharged to SNF.  He remained bradycardic and in CHB throughout hospitalization, with atrial fib. He remains on Xarelto. HCTZ and lisinopril were held in the setting of AKI, but restarted on discharge.   He comes today with his wife he is wheelchair-bound at this time.  He remains at the skilled nursing facility and continues on antibiotic therapy.  The patient has generalized fatigue and has been working with physical therapy there but has not been progressing very much.  His wife is also concerned about needing help when he is discharged from the skilled nursing facility.  He denies any dizziness or shortness of breath that he is generally deconditioned.  Past Medical History:  Diagnosis Date   Arthritis    Atrial fibrillation [facility])    Balance problem 10/31/2013   BPH (benign prostatic hyperplasia) 2014   Urology Dr [name] with [facility] [phone]    Bradycardia    a. baseline HR in the 30's. Asymptomatic - no history of PPM  placement.    CAD (coronary artery disease)    LHC 03/16/11: Mid LAD 10-20%, proximal circumflex 10%, mid RCA 20%, EF 55%.   Cataract    Complete heart block (HCC)    Fracture of femoral neck, right (HCC) 04/19/2017   GERD (gastroesophageal reflux disease)    takes occasional  zantac   Hx of echocardiogram    Echo 02/2011: EF 65-70%, normal wall motion, MAC, mild MR, mild LAE, mild RVE   Hypertension    OSA (obstructive sleep apnea)    per patient went to have sleep study done about 2 years ago, bu they never F/U with him about results; but wife reports he still has periods of apnea at night    Talar fracture    casted no surg    Past Surgical History:  Procedure Laterality Date   ABDOMINAL AORTOGRAM W/LOWER EXTREMITY N/A 01/17/2017   Procedure: ABDOMINAL AORTOGRAM W/LOWER EXTREMITY;  Surgeon: Angelia Mould, MD;  Location: [place] [facility];  Service: Cardiovascular;  Laterality: N/A;   COLONOSCOPY     Repeated and normal in 2011   FRACTURE SURGERY     HEMIARTHROPLASTY HIP Right 04/19/2017   HEMORROIDECTOMY     HIP ARTHROPLASTY Right 04/19/2017   Procedure: ARTHROPLASTY BIPOLAR HIP (HEMIARTHROPLASTY);  Surgeon: Marchia Bond, MD;  Location: [place];  Service: Orthopedics;  Laterality: Right;   HIP ARTHROPLASTY Left 12/16/2018   Procedure: ARTHROPLASTY BIPOLAR HIP (HEMIARTHROPLASTY);  Surgeon: Altamese Lake Preston, MD;  Location: [place];  Service:  Orthopedics;  Laterality: Left;   INGUINAL HERNIA REPAIR Right 02/26/2014   Procedure: LAPAROSCOPIC RIGHT INGUINAL HERNIA REPAIR;  Surgeon: Ralene Ok, MD;  Location: [place];  Service: General;  Laterality: Right;   INGUINAL HERNIA REPAIR     INSERTION OF MESH Right 02/26/2014   Procedure: INSERTION OF MESH;  Surgeon: Ralene Ok, MD;  Location: [place];  Service: General;  Laterality: Right;   LEFT HEART CATHETERIZATION WITH CORONARY ANGIOGRAM N/A 03/16/2011   Procedure: LEFT HEART CATHETERIZATION WITH CORONARY ANGIOGRAM;  Surgeon: Peter  M Martinique, MD;  Location: [facility] CATH LAB;  Service: Cardiovascular;  Laterality: N/A;   PROSTATE BIOPSY     negative - [facility]   TRANSURETHRAL RESECTION OF PROSTATE N/A 09/07/2016   Procedure: TRANSURETHRAL RESECTION OF THE PROSTATE (TURP);  Surgeon: Irine Seal, MD;  Location: [facility];  Service: Urology;  Laterality: N/A;     Current Outpatient Medications  Medication Sig Dispense Refill   acetaminophen (TYLENOL) 500 MG tablet Take 1 tablet (500 mg total) by mouth every 8 (eight) hours. (Patient taking differently: Take 500 mg by mouth every 8 (eight) hours as needed for moderate pain.) 30 tablet 0   calcium carbonate (TUMS - DOSED IN MG ELEMENTAL CALCIUM) 500 MG chewable tablet Chew 1 tablet (200 mg of elemental calcium total) by mouth 2 (two) times daily with a meal.     carbidopa-levodopa (SINEMET) 25-100 MG tablet Take 1.5 tablets by mouth in the morning and at bedtime. 270 tablet 3   cephALEXin (KEFLEX) 500 MG capsule Take 500 mg by mouth 4 (four) times daily.     docusate sodium (COLACE) 100 MG capsule Take 200 mg by mouth daily as needed for mild constipation.     feeding supplement (ENSURE ENLIVE / ENSURE PLUS) LIQD Take 237 mLs by mouth daily. 237 mL 12   guaiFENesin (MUCINEX) 600 MG 12 hr tablet Take 1 tablet (600 mg total) by mouth 2 (two) times daily for 5 days. 10 tablet 0   lisinopril (ZESTRIL) 10 MG tablet Take 1 tablet (10 mg total) by mouth daily. 90 tablet 3   Misc Natural Products (PROSTATE) CAPS Take 1 tablet by mouth 2 (two) times daily. Prostate Plus otc supplement     Multiple Vitamins-Minerals (PRESERVISION AREDS 2+MULTI VIT PO) Take 1 capsule by mouth 2 (two) times daily.     rivaroxaban (XARELTO) 20 MG TABS tablet TAKE ONE TABLET BY MOUTH DAILY WITH SUPPER (Patient taking differently: Take 20 mg by mouth daily with supper.) 90 tablet 3   senna (SENOKOT) 8.6 MG TABS tablet Take 1 tablet by mouth daily as needed for mild constipation.     timolol (TIMOPTIC) 0.5 %  ophthalmic solution INSTILL ONE DROP IN [facility] EYE DAILY (Patient taking differently: Place 1 drop into both eyes 2 (two) times daily.) 10 mL 5   vitamin C (ASCORBIC ACID) 500 MG tablet Take 500 mg by mouth daily.     No current facility-administered medications for this visit.    Allergies:   Doxycycline and Keflex [cephalexin]    Social History:  The patient  reports that he quit smoking about 65 years ago. His smoking use included cigarettes. He has a 3.00 pack-year smoking history. He has quit using smokeless tobacco. He reports current alcohol use of about 14.0 standard drinks per week. He reports that he does not use drugs.   Family History:  The patient's family history includes Hypertension in his mother; Multiple sclerosis in his sister.  ROS: All other systems are reviewed and negative. Unless otherwise mentioned in H&P    PHYSICAL EXAM: VS:  BP 110/66   Resp 18   Ht 6' (1.829 m)   Wt 175 lb (79.4 kg)   BMI 23.73 kg/m  , BMI Body mass index is 23.73 kg/m. GEN: Well nourished, well developed, in no acute distress HEENT: normal Neck: no JVD, carotid bruits, or masses Cardiac: IRRR; bradycardic, 1/6 systolic murmurs, rubs, or gallops,no edema  Respiratory:  Clear to auscultation bilaterally, normal work of breathing, some crackles in the bases without wheezing or cough GI: soft, nontender, nondistended, + BS MS: no deformity or atrophy, cyanosis noted in the pretibial and bilateral feet, also seen in hands bilaterally.  Mild dependent edema. Skin: warm and dry, no rash Neuro:  Strength diminished, and sensation are intact, hard of hearing. Psych: euthymic mood, full affect   EKG:  EKG is not ordered today.  Recent Labs: 10/19/2020: ALT 6; BUN 28; Creatinine, Ser 1.09; Hemoglobin 10.0; Magnesium 2.0; Platelets 200; Potassium 4.1; Sodium 139    Lipid Panel    Component Value Date/Time   CHOL 147 11/09/2016 1746   TRIG 55 11/09/2016 1746   HDL 71 11/09/2016 1746    CHOLHDL 2.1 11/09/2016 1746   CHOLHDL 2.4 03/16/2011 0510   VLDL 11 03/16/2011 0510   LDLCALC 65 11/09/2016 1746      Wt Readings from Last 3 Encounters:  10/24/20 175 lb (79.4 kg)  10/19/20 166 lb 5 oz (75.4 kg)  06/18/20 175 lb (79.4 kg)      Other studies Reviewed: Echocardiogram 03/19/2017 Left ventricle: The cavity size was moderately dilated. Wall    thickness was normal. Systolic function was normal. The estimated    ejection fraction was in the range of 50% to 55%.  - Mitral valve: There was mild regurgitation.  - Left atrium: The atrium was severely dilated.  - Right ventricle: The cavity size was mildly dilated.  - Right atrium: The atrium was severely dilated.  - Pulmonary arteries: PA peak pressure: 69 mm Hg (S).  - Pericardium, extracardiac: A trivial pericardial effusion was    identified.    ASSESSMENT AND PLAN:  1.  Complete heart block: Patient is asymptomatic not on any AV nodal blocking agents.  It has been discussed in previous visits over the years concerning pacemaker implantation and the patient has refused.  Currently at the age of 38 this would not be readdressed.  2.  Atrial fibrillation: Remains on rivaroxaban 20 mg daily.  No complaints of bleeding or hematuria.  Continue current medication management.  3.  Hypertension: Due to inactivity and deconditioning, blood pressure is soft.  He had been taken off of lisinopril HCTZ during recent hospitalization but this was restarted on discharge.  I am going to remove HCTZ from his medication regimen and continue lisinopril to avoid dehydration and hopefully allow for better blood pressure levels.  Due to his frailty I do not want to endanger him to have falls due to orthostasis.  4.  UTI, status post hospitalization: Currently recovering and continues on antibiotic therapy p.o.  He is currently at skilled nursing facility where he is undergoing physical therapy.  His wife is concerned about taking him home and  needing extra help when he is there.  She is advised to speak with the social worker at the skilled nursing facility to help her devise a plan that suits both their needs.   Current medicines are reviewed at  length with the patient today.  I have spent 30 min's dedicated to the care of this patient on the date of this encounter to include pre-visit review of records, assessment, management and diagnostic testing,with shared decision making.  Labs/ tests ordered today include: none Phill Myron. [place] Pugh, ANP, AACC   10/24/2020 3:34 PM    [facility] [place] [address] Office [phone] Fax [phone]  Notice: This dictation was prepared with Dragon dictation along with smaller phrase technology. Any transcriptional errors that result from this process are unintentional and may not be corrected upon review.

## 2020-10-24 ENCOUNTER — Other Ambulatory Visit: Payer: Self-pay

## 2020-10-24 ENCOUNTER — Ambulatory Visit: Payer: Medicare HMO | Admitting: Adult Health

## 2020-10-24 ENCOUNTER — Encounter: Payer: Self-pay | Admitting: Adult Health

## 2020-10-24 VITALS — BP 110/66 | Resp 18 | Ht 72.0 in | Wt 175.0 lb

## 2020-10-24 DIAGNOSIS — I10 Essential (primary) hypertension: Secondary | ICD-10-CM

## 2020-10-24 DIAGNOSIS — N3000 Acute cystitis without hematuria: Secondary | ICD-10-CM

## 2020-10-24 DIAGNOSIS — I459 Conduction disorder, unspecified: Secondary | ICD-10-CM | POA: Diagnosis not present

## 2020-10-24 DIAGNOSIS — I482 Chronic atrial fibrillation, unspecified: Secondary | ICD-10-CM

## 2020-10-24 MED ORDER — LISINOPRIL 10 MG PO TABS: 10.0000 mg | ORAL_TABLET | Freq: Every day | ORAL | 3 refills | Status: AC

## 2020-10-24 NOTE — Patient Instructions (Signed)
Medication Instructions:  Stop Lisinopril/HCTZ 10/12.5 mg . Start Lisinopril 10 mg ( 1 Tablet Daily). *If you need a refill on your cardiac medications before your next appointment, please call your pharmacy*   Lab Work: No Labs. If you have labs (blood work) drawn today and your tests are completely normal, you will receive your results only by: [place] (if you have MyChart) OR A paper copy in the mail If you have any lab test that is abnormal or we need to change your treatment, we will call you to review the results.   Testing/Procedures: No Testing   Follow-Up: At [facility], you and your health needs are our priority.  As part of our continuing mission to provide you with exceptional heart care, we have created designated Provider Care Teams.  These Care Teams include your primary Cardiologist (physician) and Advanced Practice Providers (APPs -  Physician Assistants and Nurse Practitioners) who all work together to provide you with the care you need, when you need it.  We recommend signing up for the patient portal called "MyChart".  Sign up information is provided on this After Visit Summary.  MyChart is used to connect with patients for Virtual Visits (Telemedicine).  Patients are able to view lab/test results, encounter notes, upcoming appointments, etc.  Non-urgent messages can be sent to your provider as well.   To learn more about what you can do with MyChart, go to [email or web address].    Your next appointment:   December 25, 2020 3:20 PM  The format for your next appointment:   In Person  Provider:   Minus Breeding, MD

## 2020-10-28 ENCOUNTER — Ambulatory Visit: Payer: Medicare HMO | Admitting: Podiatry

## 2020-10-29 DIAGNOSIS — I10 Essential (primary) hypertension: Secondary | ICD-10-CM | POA: Diagnosis not present

## 2020-10-29 DIAGNOSIS — N3000 Acute cystitis without hematuria: Secondary | ICD-10-CM | POA: Diagnosis not present

## 2020-10-29 DIAGNOSIS — R609 Edema, unspecified: Secondary | ICD-10-CM | POA: Diagnosis not present

## 2020-10-29 DIAGNOSIS — F0150 Vascular dementia without behavioral disturbance: Secondary | ICD-10-CM | POA: Diagnosis not present

## 2020-10-29 DIAGNOSIS — G20 Parkinson's disease: Secondary | ICD-10-CM | POA: Diagnosis not present

## 2020-11-05 DIAGNOSIS — R1310 Dysphagia, unspecified: Secondary | ICD-10-CM | POA: Diagnosis not present

## 2020-11-05 DIAGNOSIS — F0150 Vascular dementia without behavioral disturbance: Secondary | ICD-10-CM | POA: Diagnosis not present

## 2020-11-05 DIAGNOSIS — K219 Gastro-esophageal reflux disease without esophagitis: Secondary | ICD-10-CM | POA: Diagnosis not present

## 2020-11-05 DIAGNOSIS — R609 Edema, unspecified: Secondary | ICD-10-CM | POA: Diagnosis not present

## 2020-11-17 ENCOUNTER — Other Ambulatory Visit: Payer: Self-pay

## 2020-11-17 ENCOUNTER — Encounter (INDEPENDENT_AMBULATORY_CARE_PROVIDER_SITE_OTHER): Payer: Self-pay | Admitting: Ophthalmology

## 2020-11-17 ENCOUNTER — Ambulatory Visit (INDEPENDENT_AMBULATORY_CARE_PROVIDER_SITE_OTHER): Payer: Medicare HMO | Admitting: Ophthalmology

## 2020-11-17 DIAGNOSIS — H34811 Central retinal vein occlusion, right eye, with macular edema: Secondary | ICD-10-CM | POA: Diagnosis not present

## 2020-11-17 DIAGNOSIS — H353221 Exudative age-related macular degeneration, left eye, with active choroidal neovascularization: Secondary | ICD-10-CM

## 2020-11-17 DIAGNOSIS — H348111 Central retinal vein occlusion, right eye, with retinal neovascularization: Secondary | ICD-10-CM

## 2020-11-17 NOTE — Assessment & Plan Note (Signed)
Diffuse macular edema no improvement in recent injections, will continue to observe

## 2020-11-17 NOTE — Progress Notes (Signed)
11/17/2020     CHIEF COMPLAINT Patient presents for  Chief Complaint  Patient presents with   Retina Follow Up      HISTORY OF PRESENT ILLNESS: Chad Russell is a 87 y.o. male who presents to the clinic today for:   HPI     Retina Follow Up   Patient presents with  CRVO/BRVO.  In both eyes.  This started 10 weeks ago.  Duration of 10 weeks.        Comments   10 week f/u OU with OCT and FP  Pt states he can tell that his left eye is weaker in vision than his right eye. Pt states he can pick up more details with his right eye. He states this is not necessarily something new, just an observation. Pt denies any new flashes or floaters in either eye. Pt denies any eye pain.   Eye Meds: Timolol-per pt is prescribed 2 gtts BID OU, States he is likely getting the drop in ~ 75% of the time, states this is due to inconsistency with the rehabilitation home he is residing in. AREDS      Last edited by Reather Littler, COA on 11/17/2020  1:21 PM.      Referring physician: [name], MD [address] [place],  [place] [address]  HISTORICAL INFORMATION:   Selected notes from the MEDICAL RECORD NUMBER       CURRENT MEDICATIONS: Current Outpatient Medications (Ophthalmic Drugs)  Medication Sig   timolol (TIMOPTIC) 0.5 % ophthalmic solution INSTILL ONE DROP IN [facility] EYE DAILY (Patient taking differently: Place 1 drop into both eyes 2 (two) times daily.)   No current facility-administered medications for this visit. (Ophthalmic Drugs)   Current Outpatient Medications (Other)  Medication Sig   acetaminophen (TYLENOL) 500 MG tablet Take 1 tablet (500 mg total) by mouth every 8 (eight) hours. (Patient taking differently: Take 500 mg by mouth every 8 (eight) hours as needed for moderate pain.)   calcium carbonate (TUMS - DOSED IN MG ELEMENTAL CALCIUM) 500 MG chewable tablet Chew 1 tablet (200 mg of elemental calcium total) by mouth 2 (two) times daily with a meal.    carbidopa-levodopa (SINEMET) 25-100 MG tablet Take 1.5 tablets by mouth in the morning and at bedtime.   cephALEXin (KEFLEX) 500 MG capsule Take 500 mg by mouth 4 (four) times daily.   docusate sodium (COLACE) 100 MG capsule Take 200 mg by mouth daily as needed for mild constipation.   feeding supplement (ENSURE ENLIVE / ENSURE PLUS) LIQD Take 237 mLs by mouth daily.   lisinopril (ZESTRIL) 10 MG tablet Take 1 tablet (10 mg total) by mouth daily.   Misc Natural Products (PROSTATE) CAPS Take 1 tablet by mouth 2 (two) times daily. Prostate Plus otc supplement   Multiple Vitamins-Minerals (PRESERVISION AREDS 2+MULTI VIT PO) Take 1 capsule by mouth 2 (two) times daily.   rivaroxaban (XARELTO) 20 MG TABS tablet TAKE ONE TABLET BY MOUTH DAILY WITH SUPPER (Patient taking differently: Take 20 mg by mouth daily with supper.)   senna (SENOKOT) 8.6 MG TABS tablet Take 1 tablet by mouth daily as needed for mild constipation.   vitamin C (ASCORBIC ACID) 500 MG tablet Take 500 mg by mouth daily.   No current facility-administered medications for this visit. (Other)      REVIEW OF SYSTEMS:    ALLERGIES Allergies  Allergen Reactions   Doxycycline Other (See Comments)    Upset stomach  Keflex [Cephalexin] Other (See Comments)    Abdominal discomfort 04-19-17 Pt has tolerated orally    PAST MEDICAL HISTORY Past Medical History:  Diagnosis Date   Arthritis    Atrial fibrillation [facility])    Balance problem 10/31/2013   BPH (benign prostatic hyperplasia) 2014   Urology Dr [name] with [facility] [phone]    Bradycardia    a. baseline HR in the 30's. Asymptomatic - no history of PPM placement.    CAD (coronary artery disease)    LHC 03/16/11: Mid LAD 10-20%, proximal circumflex 10%, mid RCA 20%, EF 55%.   Cataract    Complete heart block (HCC)    Fracture of femoral neck, right (HCC) 04/19/2017   GERD (gastroesophageal reflux disease)    takes occasional  zantac   Hx of echocardiogram    Echo 02/2011:  EF 65-70%, normal wall motion, MAC, mild MR, mild LAE, mild RVE   Hypertension    OSA (obstructive sleep apnea)    per patient went to have sleep study done about 2 years ago, bu they never F/U with him about results; but wife reports he still has periods of apnea at night    Talar fracture    casted no surg   Past Surgical History:  Procedure Laterality Date   ABDOMINAL AORTOGRAM W/LOWER EXTREMITY N/A 01/17/2017   Procedure: ABDOMINAL AORTOGRAM W/LOWER EXTREMITY;  Surgeon: Angelia Mould, MD;  Location: [facility];  Service: Cardiovascular;  Laterality: N/A;   COLONOSCOPY     Repeated and normal in 2011   FRACTURE SURGERY     HEMIARTHROPLASTY HIP Right 04/19/2017   HEMORROIDECTOMY     HIP ARTHROPLASTY Right 04/19/2017   Procedure: ARTHROPLASTY BIPOLAR HIP (HEMIARTHROPLASTY);  Surgeon: Marchia Bond, MD;  Location: [place];  Service: Orthopedics;  Laterality: Right;   HIP ARTHROPLASTY Left 12/16/2018   Procedure: ARTHROPLASTY BIPOLAR HIP (HEMIARTHROPLASTY);  Surgeon: Altamese Parkerfield, MD;  Location: [place];  Service: Orthopedics;  Laterality: Left;   INGUINAL HERNIA REPAIR Right 02/26/2014   Procedure: LAPAROSCOPIC RIGHT INGUINAL HERNIA REPAIR;  Surgeon: Ralene Ok, MD;  Location: [place];  Service: General;  Laterality: Right;   INGUINAL HERNIA REPAIR     INSERTION OF MESH Right 02/26/2014   Procedure: INSERTION OF MESH;  Surgeon: Ralene Ok, MD;  Location: [place];  Service: General;  Laterality: Right;   LEFT HEART CATHETERIZATION WITH CORONARY ANGIOGRAM N/A 03/16/2011   Procedure: LEFT HEART CATHETERIZATION WITH CORONARY ANGIOGRAM;  Surgeon: Peter M Martinique, MD;  Location: [facility];  Service: Cardiovascular;  Laterality: N/A;   PROSTATE BIOPSY     negative - [facility]   TRANSURETHRAL RESECTION OF PROSTATE N/A 09/07/2016   Procedure: TRANSURETHRAL RESECTION OF THE PROSTATE (TURP);  Surgeon: Irine Seal, MD;  Location: [facility];  Service: Urology;  Laterality: N/A;     FAMILY HISTORY Family History  Problem Relation Age of Onset   Hypertension Mother    Multiple sclerosis Sister     SOCIAL HISTORY Social History   Tobacco Use   Smoking status: Former    Packs/day: 1.00    Years: 3.00    Pack years: 3.00    Types: Cigarettes    Quit date: 03/17/1955    Years since quitting: 65.7   Smokeless tobacco: Former  Scientific laboratory technician Use: Never used  Substance Use Topics   Alcohol use: Yes    Alcohol/week: 14.0 standard drinks    Types: 7 Cans of beer, 7 Standard drinks  or equivalent per week    Comment: daily   Drug use: No         OPHTHALMIC EXAM:  Base Eye Exam     Visual Acuity (ETDRS)       Right Left   Dist cc CF_0  CF_1    Dist ph cc NI NI    Correction: Glasses         Tonometry (Tonopen, 1:31 PM)       Right Left   Pressure 7 12         Pupils       Dark Light Shape React APD   Right 4 3 Round Brisk None   Left 4 4 Round Minimal +1         Visual Fields       Left Right   Restrictions Total superior temporal, inferior temporal, superior nasal, inferior nasal deficiencies Total superior temporal, inferior temporal, superior nasal, inferior nasal deficiencies         Extraocular Movement       Right Left    XT Full    -- -- --  --  --  -- -- --   -- -- --  --  --  -- -- --           Neuro/Psych     Oriented x3: Yes   Mood/Affect: Normal         Dilation     Both eyes: 1.0% Mydriacyl, 2.5% Phenylephrine @ 1:31 PM           Slit Lamp and Fundus Exam     External Exam       Right Left   External Normal Normal         Slit Lamp Exam       Right Left   Lids/Lashes Normal Normal   Conjunctiva/Sclera White and quiet White and quiet   Cornea Clear Clear   Anterior Chamber Deep and quiet Deep and quiet   Iris Round and reactive Round and reactive   Lens Posterior chamber intraocular lens Posterior chamber intraocular lens   Anterior Vitreous Normal Normal          Fundus Exam       Right Left   Posterior Vitreous Posterior vitreous detachment Posterior vitreous detachment   Disc Normal Pallor 2+   C/D Ratio 0.5 0.75   Macula Macular thickening, Cystoid macular edema, Microaneurysms, Atrophy, Retinal pigment epithelial atrophy, Drusen, Soft drusen, Intraretinal hemorrhage Geographic atrophy, Soft drusen,no hemorrhage   Vessels Old central retinal vein occlusion intraretinal hemorrhages Normal   Periphery  good sector PRP temporally, nearly 360 anteriorly Normal            IMAGING AND PROCEDURES  Imaging and Procedures for 11/17/20  OCT, Retina - OU - Both Eyes       Right Eye Quality was borderline. Scan locations included subfoveal. Central Foveal Thickness: 698. Progression has worsened. Findings include cystoid macular edema, abnormal foveal contour.   Left Eye Quality was good. Scan locations included subfoveal. Central Foveal Thickness: 245. Progression has improved. Findings include abnormal foveal contour, inner retinal atrophy, outer retinal atrophy, central retinal atrophy, retinal drusen , subretinal hyper-reflective material, subretinal scarring, no SRF.   Notes Massive CME secondary to CRVO, OD, sensitive to Eylea in the past, and now at 13 weeks postinjection and now stable follow-up OD next as scheduled   OS, much less active CNVM, chronic subfoveal scarring limits the acuity OS yet no progression of  intraretinal or subretinal fluid.,  At 3-monthfollow-up observe     Color Fundus Photography Optos - OU - Both Eyes       Right Eye Progression has been stable. Disc findings include normal observations. Macula : retinal pigment epithelium abnormalities. Periphery : normal observations.   Left Eye Progression has been stable. Disc findings include normal observations. Macula : geographic atrophy, retinal pigment epithelium abnormalities. Vessels : normal observations. Periphery : normal observations.   Notes OD  with good PRP, old compensated CRV O no sign of neovascular complications post PRP    OS  , With geographic atrophy in the fovea limiting acuity no signs of CNVM             ASSESSMENT/PLAN:  Central retinal vein occlusion with neovascularization of right eye Stable condition OD with good PRP no signs of neovascular complications progressing  Central retinal vein occlusion with macular edema of right eye Diffuse macular edema no improvement in recent injections, will continue to observe  Exudative age-related macular degeneration of left eye with active choroidal neovascularization (HCC) No signs of active CNVM     ICD-10-CM   1. Central retinal vein occlusion with neovascularization of right eye  H34.8111 OCT, Retina - OU - Both Eyes    Color Fundus Photography Optos - OU - Both Eyes    2. Central retinal vein occlusion with macular edema of right eye  H34.8110     3. Exudative age-related macular degeneration of left eye with active choroidal neovascularization ([place]  H35.3221       1.  OD with history of CRV O now stabilized post PRP will continue to observe CME is a component of CRV O no improvement in past antivegF will continue to observe  2.  OS with geographic atrophy limiting acuity no signs of active CNVM will observe  3.  Ophthalmic Meds Ordered this visit:  No orders of the defined types were placed in this encounter.      Return in about 6 months (around 05/17/2021) for DILATE OU, COLOR FP, OCT.  There are no Patient Instructions on file for this visit.   Explained the diagnoses, plan, and follow up with the patient and they expressed understanding.  Patient expressed understanding of the importance of proper follow up care.   GClent DemarkRankin M.D. Diseases & Surgery of the Retina and Vitreous Retina & Diabetic EMax09/26/22     Abbreviations: M myopia (nearsighted); A astigmatism; H hyperopia (farsighted); P presbyopia; Mrx spectacle  prescription;  CTL contact lenses; OD right eye; OS left eye; OU both eyes  XT exotropia; ET esotropia; PEK punctate epithelial keratitis; PEE punctate epithelial erosions; DES dry eye syndrome; MGD meibomian gland dysfunction; ATs artificial tears; PFAT's preservative free artificial tears; [place] sclerotic cataract; PSC posterior subcapsular cataract; ERM epi-retinal membrane; PVD posterior vitreous detachment; RD retinal detachment; DM diabetes mellitus; DR diabetic retinopathy; NPDR non-proliferative diabetic retinopathy; PDR proliferative diabetic retinopathy; CSME clinically significant macular edema; DME diabetic macular edema; dbh dot blot hemorrhages; CWS cotton wool spot; POAG primary open angle glaucoma; C/D cup-to-disc ratio; HVF humphrey visual field; GVF goldmann visual field; OCT optical coherence tomography; IOP intraocular pressure; BRVO Branch retinal vein occlusion; CRVO central retinal vein occlusion; CRAO central retinal artery occlusion; BRAO branch retinal artery occlusion; RT retinal tear; SB scleral buckle; PPV pars plana vitrectomy; VH Vitreous hemorrhage; PRP panretinal laser photocoagulation; IVK intravitreal kenalog; VMT vitreomacular traction; MH Macular hole;  NVD neovascularization of the disc;  NVE neovascularization elsewhere; AREDS age related eye disease study; ARMD age related macular degeneration; POAG primary open angle glaucoma; EBMD epithelial/anterior basement membrane dystrophy; ACIOL anterior chamber intraocular lens; IOL intraocular lens; PCIOL posterior chamber intraocular lens; Phaco/IOL phacoemulsification with intraocular lens placement; [place] photorefractive keratectomy; LASIK laser assisted in situ keratomileusis; HTN hypertension; DM diabetes mellitus; COPD chronic obstructive pulmonary disease

## 2020-11-17 NOTE — Assessment & Plan Note (Signed)
Stable condition OD with good PRP no signs of neovascular complications progressing

## 2020-11-17 NOTE — Assessment & Plan Note (Signed)
No signs of active CNVM

## 2020-11-18 DIAGNOSIS — I952 Hypotension due to drugs: Secondary | ICD-10-CM | POA: Diagnosis not present

## 2020-11-18 DIAGNOSIS — G20 Parkinson's disease: Secondary | ICD-10-CM | POA: Diagnosis not present

## 2020-12-01 ENCOUNTER — Telehealth: Payer: Self-pay

## 2020-12-01 NOTE — Telephone Encounter (Signed)
Please advise as the pts wife has stated pt is now home from the hospital and is in need of an apptmnt to see you this week as you seen his wife and she states she has informed you that the pt was hospitalized and missed his TOC apptmnt. Pt is to start PT next week and needs to see a PCP this week. Pts wife is Vennie Waymire, MRN: [id number].

## 2020-12-03 ENCOUNTER — Encounter: Payer: Self-pay | Admitting: Family Medicine

## 2020-12-03 NOTE — Telephone Encounter (Signed)
Nurse Royanne Foots from [facility] requesting call back to see if patient can be accommodated with TOC appt sooner than January  Patient missed previous TOC appt due to hospitalization   [place] [phone]

## 2020-12-03 NOTE — Telephone Encounter (Signed)
No note needed 

## 2020-12-04 NOTE — Telephone Encounter (Signed)
Patient is established with Dr. Carlota Raspberry. Patient was attempting to schedule here at [facility] because he thought Dr. Carlota Raspberry was out of network.  I gave the [facility] nurse the [facility] number to call and set up an appointment there

## 2020-12-05 NOTE — Telephone Encounter (Signed)
Noted  

## 2020-12-08 ENCOUNTER — Telehealth: Payer: Self-pay

## 2020-12-08 DIAGNOSIS — N39498 Other specified urinary incontinence: Secondary | ICD-10-CM | POA: Diagnosis not present

## 2020-12-08 DIAGNOSIS — I459 Conduction disorder, unspecified: Secondary | ICD-10-CM | POA: Diagnosis not present

## 2020-12-08 DIAGNOSIS — N401 Enlarged prostate with lower urinary tract symptoms: Secondary | ICD-10-CM | POA: Diagnosis not present

## 2020-12-08 DIAGNOSIS — M79671 Pain in right foot: Secondary | ICD-10-CM | POA: Diagnosis not present

## 2020-12-08 DIAGNOSIS — I739 Peripheral vascular disease, unspecified: Secondary | ICD-10-CM | POA: Diagnosis not present

## 2020-12-08 DIAGNOSIS — I480 Paroxysmal atrial fibrillation: Secondary | ICD-10-CM | POA: Diagnosis not present

## 2020-12-08 DIAGNOSIS — H353221 Exudative age-related macular degeneration, left eye, with active choroidal neovascularization: Secondary | ICD-10-CM | POA: Diagnosis not present

## 2020-12-08 DIAGNOSIS — G20 Parkinson's disease: Secondary | ICD-10-CM | POA: Diagnosis not present

## 2020-12-08 DIAGNOSIS — M79672 Pain in left foot: Secondary | ICD-10-CM | POA: Diagnosis not present

## 2020-12-08 NOTE — Telephone Encounter (Signed)
Caller name:Gracie ([facility])   On DPR? :Yes  Call back number:[phone]  Provider they see: Carlota Raspberry  Reason for call:Need Verbal orders  Home Health PT therapy  1x week 1week 2x week for 4 weeks 1 x week for 4 weeks

## 2020-12-09 NOTE — Telephone Encounter (Signed)
He has a history of bradycardia with known complete heart block and has been evaluated by cardiology previously.  Would recommend letting cardiology know, but if any new dizziness or shortness of breath he should be evaluated.

## 2020-12-09 NOTE — Telephone Encounter (Signed)
Attempted to call to give verbal but no answer

## 2020-12-09 NOTE — Telephone Encounter (Signed)
Patient's heart rate is in the 30's - 36 to 38.  Just wanted to let Dr. Carlota Raspberry know about this also.  Please call  [place] - [phone]

## 2020-12-10 ENCOUNTER — Encounter (INDEPENDENT_AMBULATORY_CARE_PROVIDER_SITE_OTHER): Payer: Medicare HMO | Admitting: Ophthalmology

## 2020-12-10 NOTE — Telephone Encounter (Signed)
Called and left verbal order per pcp by vm.

## 2020-12-11 ENCOUNTER — Telehealth: Payer: Self-pay | Admitting: Family Medicine

## 2020-12-11 DIAGNOSIS — I459 Conduction disorder, unspecified: Secondary | ICD-10-CM | POA: Diagnosis not present

## 2020-12-11 DIAGNOSIS — I480 Paroxysmal atrial fibrillation: Secondary | ICD-10-CM | POA: Diagnosis not present

## 2020-12-11 DIAGNOSIS — M79672 Pain in left foot: Secondary | ICD-10-CM | POA: Diagnosis not present

## 2020-12-11 DIAGNOSIS — M79671 Pain in right foot: Secondary | ICD-10-CM | POA: Diagnosis not present

## 2020-12-11 DIAGNOSIS — G20 Parkinson's disease: Secondary | ICD-10-CM | POA: Diagnosis not present

## 2020-12-11 DIAGNOSIS — N39498 Other specified urinary incontinence: Secondary | ICD-10-CM | POA: Diagnosis not present

## 2020-12-11 DIAGNOSIS — H353221 Exudative age-related macular degeneration, left eye, with active choroidal neovascularization: Secondary | ICD-10-CM | POA: Diagnosis not present

## 2020-12-11 DIAGNOSIS — N401 Enlarged prostate with lower urinary tract symptoms: Secondary | ICD-10-CM | POA: Diagnosis not present

## 2020-12-11 DIAGNOSIS — I739 Peripheral vascular disease, unspecified: Secondary | ICD-10-CM | POA: Diagnosis not present

## 2020-12-11 NOTE — Telephone Encounter (Signed)
..  Home Health Verbal Orders  Agency:  [facility]  Caller: (Contact and title)  Hershal Coria Call back #: [phone] Fax #:  [phone]    Requesting OT/ PT/ Skilled nursing/ Social Work/ Speech:   Speech therapy  Reason for Request:  Disphasia  Frequency:  1 x week for 8 weeks   HH needs F2F w/in last 30 days

## 2020-12-12 ENCOUNTER — Encounter: Payer: Self-pay | Admitting: Gastroenterology

## 2020-12-12 ENCOUNTER — Ambulatory Visit (INDEPENDENT_AMBULATORY_CARE_PROVIDER_SITE_OTHER): Payer: Medicare HMO | Admitting: Gastroenterology

## 2020-12-12 VITALS — BP 120/70 | HR 40 | Ht 72.0 in | Wt 170.0 lb

## 2020-12-12 DIAGNOSIS — K219 Gastro-esophageal reflux disease without esophagitis: Secondary | ICD-10-CM | POA: Diagnosis not present

## 2020-12-12 DIAGNOSIS — R1312 Dysphagia, oropharyngeal phase: Secondary | ICD-10-CM

## 2020-12-12 NOTE — Telephone Encounter (Signed)
Called - left VM to call back (unsure if secure voicemail - no patient info provided)

## 2020-12-12 NOTE — Progress Notes (Signed)
HPI : Chad Russell is a very pleasant 87 year old male with a history of atrial fibrillation, complete heart block and Parkinson's disease who is referred to [place] for further evaluation of suspected gastroesophageal reflux.  When asked what symptoms he is having, the patient reports that he has coughing spells which can be quite severe and sometimes interfere with his ability to breathe.  These episodes tend to occur while he is eating noticed more with pastries.  He also feels like he is having difficulty clearing all of food and secretions from his pharynx.  He does also have occasional symptoms of heartburn for which he takes Tums as needed.  He estimates having heartburn a couple of times a week.  He denies problems with dysphagia.  He denies any problems with nocturnal reflux symptoms such as awakening from sleep coughing or with acid in his mouth.  He reports that he was evaluated by speech pathology during her recent hospitalization, and given recommendations on how to help reduce symptoms of coughing and choking when swallowing.  He states that these recommendations have helped.  He is on a thickened liquid diet.  He denies any problems with abdominal pain, nausea or vomiting.  He has chronic constipation, which is managed with daily stool softeners and occasional senna.  He has bowel movement about every 3 days. The patient's functional status has declined in the past year, and he is mostly limited to a wheelchair.  He was recently discharged from a skilled nursing facility after his hospitalization for generalized weakness and a UTI.  Past Medical History:  Diagnosis Date   Arthritis    Atrial fibrillation [facility])    Balance problem 10/31/2013   BPH (benign prostatic hyperplasia) 2014   Urology Dr [name] with [facility] [phone]    Bradycardia    a. baseline HR in the 30's. Asymptomatic - no history of PPM placement.    CAD (coronary artery disease)    LHC 03/16/11: Mid LAD 10-20%, proximal  circumflex 10%, mid RCA 20%, EF 55%.   Cataract    Complete heart block (HCC)    Fracture of femoral neck, right ([place]) 04/19/2017   GERD (gastroesophageal reflux disease)    takes occasional  zantac   Hx of echocardiogram    Echo 02/2011: EF 65-70%, normal wall motion, MAC, mild MR, mild LAE, mild RVE   Hypertension    OSA (obstructive sleep apnea)    per patient went to have sleep study done about 2 years ago, bu they never F/U with him about results; but wife reports he still has periods of apnea at night    Talar fracture    casted no surg   UTI (urinary tract infection) 10/2020     Past Surgical History:  Procedure Laterality Date   ABDOMINAL AORTOGRAM W/LOWER EXTREMITY N/A 01/17/2017   Procedure: ABDOMINAL AORTOGRAM W/LOWER EXTREMITY;  Surgeon: Angelia Mould, MD;  Location: [place] [facility];  Service: Cardiovascular;  Laterality: N/A;   COLONOSCOPY     Repeated and normal in 2011   FRACTURE SURGERY     HEMIARTHROPLASTY HIP Right 04/19/2017   HEMORROIDECTOMY     HIP ARTHROPLASTY Right 04/19/2017   Procedure: ARTHROPLASTY BIPOLAR HIP (HEMIARTHROPLASTY);  Surgeon: Marchia Bond, MD;  Location: [place];  Service: Orthopedics;  Laterality: Right;   HIP ARTHROPLASTY Left 12/16/2018   Procedure: ARTHROPLASTY BIPOLAR HIP (HEMIARTHROPLASTY);  Surgeon: Altamese Whittlesey, MD;  Location: [place];  Service: Orthopedics;  Laterality: Left;   INGUINAL HERNIA REPAIR  Right 02/26/2014   Procedure: LAPAROSCOPIC RIGHT INGUINAL HERNIA REPAIR;  Surgeon: Ralene Ok, MD;  Location: [place];  Service: General;  Laterality: Right;   INGUINAL HERNIA REPAIR     INSERTION OF MESH Right 02/26/2014   Procedure: INSERTION OF MESH;  Surgeon: Ralene Ok, MD;  Location: [place];  Service: General;  Laterality: Right;   LEFT HEART CATHETERIZATION WITH CORONARY ANGIOGRAM N/A 03/16/2011   Procedure: LEFT HEART CATHETERIZATION WITH CORONARY ANGIOGRAM;  Surgeon: Peter M Martinique, MD;  Location: [facility] CATH LAB;   Service: Cardiovascular;  Laterality: N/A;   PROSTATE BIOPSY     negative - [facility]   TRANSURETHRAL RESECTION OF PROSTATE N/A 09/07/2016   Procedure: TRANSURETHRAL RESECTION OF THE PROSTATE (TURP);  Surgeon: Irine Seal, MD;  Location: [facility];  Service: Urology;  Laterality: N/A;   Family History  Problem Relation Age of Onset   Hypertension Mother    Multiple sclerosis Sister    Social History   Tobacco Use   Smoking status: Former    Packs/day: 1.00    Years: 3.00    Pack years: 3.00    Types: Cigarettes    Quit date: 03/17/1955    Years since quitting: 65.7   Smokeless tobacco: Former  Scientific laboratory technician Use: Never used  Substance Use Topics   Alcohol use: Yes    Alcohol/week: 14.0 standard drinks    Types: 7 Cans of beer, 7 Standard drinks or equivalent per week   Drug use: No   Current Outpatient Medications  Medication Sig Dispense Refill   acetaminophen (TYLENOL) 500 MG tablet Take 1 tablet (500 mg total) by mouth every 8 (eight) hours. (Patient taking differently: Take 500 mg by mouth every 8 (eight) hours as needed for moderate pain.) 30 tablet 0   calcium carbonate (TUMS - DOSED IN MG ELEMENTAL CALCIUM) 500 MG chewable tablet Chew 1 tablet (200 mg of elemental calcium total) by mouth 2 (two) times daily with a meal. (Patient taking differently: Chew 1 tablet by mouth as needed.)     carbidopa-levodopa (SINEMET) 25-100 MG tablet Take 1.5 tablets by mouth in the morning and at bedtime. 270 tablet 3   docusate sodium (COLACE) 100 MG capsule Take 200 mg by mouth as needed for mild constipation.     feeding supplement (ENSURE ENLIVE / ENSURE PLUS) LIQD Take 237 mLs by mouth daily. 237 mL 12   lisinopril (ZESTRIL) 10 MG tablet Take 1 tablet (10 mg total) by mouth daily. 90 tablet 3   Misc Natural Products (PROSTATE) CAPS Take 1 tablet by mouth 2 (two) times daily. Prostate Plus otc supplement     Multiple Vitamins-Minerals (PRESERVISION AREDS 2+MULTI VIT PO) Take 1  capsule by mouth 2 (two) times daily.     rivaroxaban (XARELTO) 20 MG TABS tablet TAKE ONE TABLET BY MOUTH DAILY WITH SUPPER (Patient taking differently: Take 20 mg by mouth daily with supper.) 90 tablet 3   senna (SENOKOT) 8.6 MG TABS tablet Take 1 tablet by mouth daily as needed for mild constipation.     timolol (TIMOPTIC) 0.5 % ophthalmic solution INSTILL ONE DROP IN [facility] EYE DAILY (Patient taking differently: Place 1 drop into both eyes 2 (two) times daily.) 10 mL 5   vitamin C (ASCORBIC ACID) 500 MG tablet Take 500 mg by mouth daily.     No current facility-administered medications for this visit.   Allergies  Allergen Reactions   Doxycycline Other (See Comments)    Upset stomach  Keflex [Cephalexin] Other (See Comments)    Abdominal discomfort 04-19-17 Pt has tolerated orally     Review of Systems: All systems reviewed and negative except where noted in HPI.    OCT, Retina - OU - Both Eyes  Result Date: 11/17/2020 Right Eye Quality was borderline. Scan locations included subfoveal. Central Foveal Thickness: 698. Progression has worsened. Findings include cystoid macular edema, abnormal foveal contour. Left Eye Quality was good. Scan locations included subfoveal. Central Foveal Thickness: 245. Progression has improved. Findings include abnormal foveal contour, inner retinal atrophy, outer retinal atrophy, central retinal atrophy, retinal drusen , subretinal hyper-reflective material, subretinal scarring, no SRF. Notes Massive CME secondary to CRVO, OD, sensitive to Eylea in the past, and now at 13 weeks postinjection and now stable follow-up OD next as scheduled OS, much less active CNVM, chronic subfoveal scarring limits the acuity OS yet no progression of intraretinal or subretinal fluid.,  At 23-monthfollow-up observe  Color Fundus Photography Optos - OU - Both Eyes  Result Date: 11/17/2020 Right Eye Progression has been stable. Disc findings include normal observations. Macula :  retinal pigment epithelium abnormalities. Periphery : normal observations. Left Eye Progression has been stable. Disc findings include normal observations. Macula : geographic atrophy, retinal pigment epithelium abnormalities. Vessels : normal observations. Periphery : normal observations. Notes OD with good PRP, old compensated CRV O no sign of neovascular complications post PRP OS  , With geographic atrophy in the fovea limiting acuity no signs of CNVM   Physical Exam: BP 120/70   Pulse (!) 40   Ht 6' (1.829 m)   Wt 170 lb (77.1 kg)   BMI 23.06 kg/m  Constitutional: Pleasant,well-developed, frail, elderly Caucasian male in no acute distress, seated in wheelchair.  Accompanied by spouse HEENT: Normocephalic and atraumatic. Conjunctivae are normal. No scleral icterus. Cardiovascular: Bradycardic, irregular Pulmonary/chest: Effort normal and breath sounds normal. No wheezing, rales or rhonchi. Abdominal: Soft, nondistended, nontender. Bowel sounds active throughout. There are no masses palpable. No hepatomegaly. Extremities: 1+ bilateral lower extremity edema Neurological: Alert and oriented to person place and time. Skin: Skin is warm and dry. No rashes noted. Psychiatric: Normal mood and affect. Behavior is normal.  CBC    Component Value Date/Time   WBC 7.9 10/19/2020 0411   RBC 3.11 (L) 10/19/2020 0411   HGB 10.0 (L) 10/19/2020 0411   HGB 12.8 (L) 08/14/2018 1424   HGB 12.3 (L) 01/04/2017 1407   HCT 30.7 (L) 10/19/2020 0411   HCT 38.3 08/14/2018 1424   HCT 37.8 (L) 01/04/2017 1407   PLT 200 10/19/2020 0411   PLT 260 08/14/2018 1424   MCV 98.7 10/19/2020 0411   MCV 98 (H) 08/14/2018 1424   MCV 95.5 01/04/2017 1407   MCH 32.2 10/19/2020 0411   MCHC 32.6 10/19/2020 0411   RDW 15.0 10/19/2020 0411   RDW 13.2 08/14/2018 1424   RDW 14.4 01/04/2017 1407   LYMPHSABS 1.5 10/19/2020 0411   LYMPHSABS 1.6 01/04/2017 1407   MONOABS 1.0 10/19/2020 0411   MONOABS 0.5 01/04/2017 1407    EOSABS 0.2 10/19/2020 0411   EOSABS 0.1 01/04/2017 1407   EOSABS 0.0 05/13/2016 1038   BASOSABS 0.0 10/19/2020 0411   BASOSABS 0.0 01/04/2017 1407    CMP     Component Value Date/Time   NA 139 10/19/2020 0411   NA 144 08/14/2018 1512   NA 139 01/04/2017 1407   K 4.1 10/19/2020 0411   K 4.3 01/04/2017 1407   CL 108 10/19/2020 0411  CO2 25 10/19/2020 0411   CO2 26 01/04/2017 1407   GLUCOSE 104 (H) 10/19/2020 0411   GLUCOSE 99 01/04/2017 1407   BUN 28 (H) 10/19/2020 0411   BUN 25 08/14/2018 1512   BUN 20.7 01/04/2017 1407   CREATININE 1.09 10/19/2020 0411   CREATININE 1.2 01/04/2017 1407   CALCIUM 8.9 10/19/2020 0411   CALCIUM 9.8 01/04/2017 1407   PROT 5.7 (L) 10/19/2020 0411   PROT 6.8 01/04/2017 1407   PROT 7.4 01/04/2017 1407   ALBUMIN 2.8 (L) 10/19/2020 0411   ALBUMIN 4.0 01/04/2017 1407   AST 20 10/19/2020 0411   AST 25 01/04/2017 1407   ALT 6 10/19/2020 0411   ALT 18 01/04/2017 1407   ALKPHOS 47 10/19/2020 0411   ALKPHOS 58 01/04/2017 1407   BILITOT 0.7 10/19/2020 0411   BILITOT 0.46 01/04/2017 1407   GFRNONAA >60 10/19/2020 0411   GFRNONAA 77 12/11/2015 1502   GFRAA >60 12/24/2018 0422   GFRAA 88 12/11/2015 1502     ASSESSMENT AND PLAN: 87 year old male with atrial fibrillation, complete heart block and Parkinson's disease with decreased functional status, mostly confined to wheelchair with occasional GERD symptoms of heartburn and throat irritation, also with coughing episodes which seem to be more related to swallowing and oropharyngeal dysphagia.  He has already been evaluated by speech pathology for his oropharyngeal dysphagia and is implementing foods to reduce his symptoms.  With regards to his reflux, given the relative infrequent reported incidence of his typical heartburn symptoms, I do not think there is a lot of benefit in adding a daily PPI.  I recommended he either continue taking Tums as needed for his heartburn, versus trying Pepcid.  The patient  is fine with taking his Tums, and I think that is very reasonable.  No indication or more invasive tests or procedures at this point.  GERD -Continue Tums as needed - Recommend against any invasive evaluation or testing for GERD  Oropharyngeal dysphagia - Continue implementing recommendations by speech pathology to include thickened liquids and head tilting with swallowing  Yonah Tangeman E. Candis Schatz, [facility]  Carlota Raspberry Ranell Patrick, MD

## 2020-12-12 NOTE — Telephone Encounter (Signed)
Verbal order given  

## 2020-12-12 NOTE — Patient Instructions (Signed)
If you are age 85 or older, your body mass index should be between 23-30. Your Body mass index is 23.06 kg/m. If this is out of the aforementioned range listed, please consider follow up with your Primary Care Provider.  If you are age 54 or younger, your body mass index should be between 19-25. Your Body mass index is 23.06 kg/m. If this is out of the aformentioned range listed, please consider follow up with your Primary Care Provider.   Continue Tums.  The [facility] providers would like to encourage you to use [facility] to communicate with providers for non-urgent requests or questions.  Due to long hold times on the telephone, sending your provider a message by [facility] may be a faster and more efficient way to get a response.  Please allow 48 business hours for a response.  Please remember that this is for non-urgent requests.   It was a pleasure to see you today!  Thank you for trusting me with your gastrointestinal care!    Scott E. Candis Schatz, MD

## 2020-12-14 DIAGNOSIS — G20 Parkinson's disease: Secondary | ICD-10-CM | POA: Diagnosis not present

## 2020-12-14 DIAGNOSIS — F0150 Vascular dementia without behavioral disturbance: Secondary | ICD-10-CM | POA: Diagnosis not present

## 2020-12-14 DIAGNOSIS — N179 Acute kidney failure, unspecified: Secondary | ICD-10-CM | POA: Diagnosis not present

## 2020-12-15 ENCOUNTER — Telehealth: Payer: Self-pay | Admitting: Family Medicine

## 2020-12-15 NOTE — Telephone Encounter (Signed)
..  Home Health Certification or Plan of Care Tracking  Is this a Certification or Plan of Care?  yes  [place]  Order Number:  [id number]  Has charge sheet been attached? yes  Where has form been placed:   In Dr. Vonna Kotyk bin up front

## 2020-12-16 DIAGNOSIS — I459 Conduction disorder, unspecified: Secondary | ICD-10-CM | POA: Diagnosis not present

## 2020-12-16 DIAGNOSIS — N39498 Other specified urinary incontinence: Secondary | ICD-10-CM | POA: Diagnosis not present

## 2020-12-16 DIAGNOSIS — N401 Enlarged prostate with lower urinary tract symptoms: Secondary | ICD-10-CM | POA: Diagnosis not present

## 2020-12-16 DIAGNOSIS — G20 Parkinson's disease: Secondary | ICD-10-CM | POA: Diagnosis not present

## 2020-12-16 DIAGNOSIS — M79671 Pain in right foot: Secondary | ICD-10-CM | POA: Diagnosis not present

## 2020-12-16 DIAGNOSIS — M79672 Pain in left foot: Secondary | ICD-10-CM | POA: Diagnosis not present

## 2020-12-16 DIAGNOSIS — I739 Peripheral vascular disease, unspecified: Secondary | ICD-10-CM | POA: Diagnosis not present

## 2020-12-16 DIAGNOSIS — I480 Paroxysmal atrial fibrillation: Secondary | ICD-10-CM | POA: Diagnosis not present

## 2020-12-16 DIAGNOSIS — H353221 Exudative age-related macular degeneration, left eye, with active choroidal neovascularization: Secondary | ICD-10-CM | POA: Diagnosis not present

## 2020-12-17 ENCOUNTER — Telehealth: Payer: Self-pay

## 2020-12-17 DIAGNOSIS — N401 Enlarged prostate with lower urinary tract symptoms: Secondary | ICD-10-CM | POA: Diagnosis not present

## 2020-12-17 DIAGNOSIS — I459 Conduction disorder, unspecified: Secondary | ICD-10-CM | POA: Diagnosis not present

## 2020-12-17 DIAGNOSIS — M79672 Pain in left foot: Secondary | ICD-10-CM | POA: Diagnosis not present

## 2020-12-17 DIAGNOSIS — G20 Parkinson's disease: Secondary | ICD-10-CM | POA: Diagnosis not present

## 2020-12-17 DIAGNOSIS — I739 Peripheral vascular disease, unspecified: Secondary | ICD-10-CM | POA: Diagnosis not present

## 2020-12-17 DIAGNOSIS — N39498 Other specified urinary incontinence: Secondary | ICD-10-CM | POA: Diagnosis not present

## 2020-12-17 DIAGNOSIS — M79671 Pain in right foot: Secondary | ICD-10-CM | POA: Diagnosis not present

## 2020-12-17 DIAGNOSIS — I480 Paroxysmal atrial fibrillation: Secondary | ICD-10-CM | POA: Diagnosis not present

## 2020-12-17 DIAGNOSIS — H353221 Exudative age-related macular degeneration, left eye, with active choroidal neovascularization: Secondary | ICD-10-CM | POA: Diagnosis not present

## 2020-12-17 NOTE — Telephone Encounter (Signed)
Error

## 2020-12-18 ENCOUNTER — Telehealth: Payer: Self-pay | Admitting: Family Medicine

## 2020-12-18 DIAGNOSIS — M79671 Pain in right foot: Secondary | ICD-10-CM | POA: Diagnosis not present

## 2020-12-18 DIAGNOSIS — I459 Conduction disorder, unspecified: Secondary | ICD-10-CM | POA: Diagnosis not present

## 2020-12-18 DIAGNOSIS — I480 Paroxysmal atrial fibrillation: Secondary | ICD-10-CM | POA: Diagnosis not present

## 2020-12-18 DIAGNOSIS — I739 Peripheral vascular disease, unspecified: Secondary | ICD-10-CM | POA: Diagnosis not present

## 2020-12-18 DIAGNOSIS — N39498 Other specified urinary incontinence: Secondary | ICD-10-CM | POA: Diagnosis not present

## 2020-12-18 DIAGNOSIS — H353221 Exudative age-related macular degeneration, left eye, with active choroidal neovascularization: Secondary | ICD-10-CM | POA: Diagnosis not present

## 2020-12-18 DIAGNOSIS — G20 Parkinson's disease: Secondary | ICD-10-CM | POA: Diagnosis not present

## 2020-12-18 DIAGNOSIS — M79672 Pain in left foot: Secondary | ICD-10-CM | POA: Diagnosis not present

## 2020-12-18 DIAGNOSIS — N401 Enlarged prostate with lower urinary tract symptoms: Secondary | ICD-10-CM | POA: Diagnosis not present

## 2020-12-18 NOTE — Telephone Encounter (Signed)
Ho..Home Health Certification or Plan of Care Tracking   Is this a Certification or Plan of Care?  yes   [place]:   [place] Number:  [id number]   Has charge sheet been attached? yes   Where has form been placed:   In Dr. Vonna Kotyk bin up frontme health is looking for a form that needs to be signed and faxed back.  See message below.  Makynzie Dobesh,cma

## 2020-12-18 NOTE — Telephone Encounter (Signed)
Faxed to [facility] - received confirmation of fax - 12/18/2020 - Chad Russell

## 2020-12-18 NOTE — Telephone Encounter (Signed)
..  Home Health Certification or Plan of Care Tracking  Is this a Certification or Plan of Care?  yes  [place]:   [place] Number:  [phone]  Has charge sheet been attached? yes  Where has form been placed:   In Dr. Vonna Kotyk bin up front

## 2020-12-19 ENCOUNTER — Telehealth: Payer: Self-pay

## 2020-12-19 NOTE — Telephone Encounter (Signed)
Caller name:Cara with [facility]   On DPR? :yes  Call back number:[phone]  Provider they see: Carlota Raspberry  Reason for call: Pt was by PT and notice wound and would like to request nurse for wound care

## 2020-12-19 NOTE — Telephone Encounter (Signed)
Verbal order for nurse assessment, but if ulcer noted, should have office visit - understanding expressed.

## 2020-12-22 ENCOUNTER — Telehealth: Payer: Self-pay | Admitting: Family Medicine

## 2020-12-22 NOTE — Telephone Encounter (Signed)
..  Home Health Certification or Plan of Care Tracking  Is this a Certification or Plan of Care?  Yes  [place]:  [place]  Order Number:  [id number]  Has charge sheet been attached? yes  Where has form been placed:   In Dr. Vonna Kotyk bin up front

## 2020-12-22 NOTE — Telephone Encounter (Signed)
Placed in yellow folder for your signature

## 2020-12-23 DIAGNOSIS — I739 Peripheral vascular disease, unspecified: Secondary | ICD-10-CM | POA: Diagnosis not present

## 2020-12-23 DIAGNOSIS — M79671 Pain in right foot: Secondary | ICD-10-CM | POA: Diagnosis not present

## 2020-12-23 DIAGNOSIS — I459 Conduction disorder, unspecified: Secondary | ICD-10-CM | POA: Diagnosis not present

## 2020-12-23 DIAGNOSIS — H353221 Exudative age-related macular degeneration, left eye, with active choroidal neovascularization: Secondary | ICD-10-CM | POA: Diagnosis not present

## 2020-12-23 DIAGNOSIS — M79672 Pain in left foot: Secondary | ICD-10-CM | POA: Diagnosis not present

## 2020-12-23 DIAGNOSIS — N401 Enlarged prostate with lower urinary tract symptoms: Secondary | ICD-10-CM | POA: Diagnosis not present

## 2020-12-23 DIAGNOSIS — G20 Parkinson's disease: Secondary | ICD-10-CM | POA: Diagnosis not present

## 2020-12-23 DIAGNOSIS — N39498 Other specified urinary incontinence: Secondary | ICD-10-CM | POA: Diagnosis not present

## 2020-12-23 DIAGNOSIS — I480 Paroxysmal atrial fibrillation: Secondary | ICD-10-CM | POA: Diagnosis not present

## 2020-12-23 NOTE — Progress Notes (Signed)
Cardiology Office Note   Date:  12/25/2020   ID:  Chad Russell, Chad Russell 1926-10-17, MRN [id number]  PCP:  [name], MD  Cardiologist:   Minus Breeding, MD   Chief Complaint  Patient presents with   Atrial Fibrillation       History of Present Illness: Chad Russell is a 87 y.o. male who followup of CHB and atrial fib.  Since I last saw him he was in the hospital with a UTI and sepsis and then went for 5 weeks to rehab.  He has very limited mobility at home.  He walks with a walker.  They have a hospital bed.  He is quite frail and he is alone at home with his wife who also is frail and walking with a walker.  He has not been having any new cardiovascular symptoms.  In particular he has not been having any chest pressure, neck or arm discomfort.  He is not having any palpitations, presyncope or syncope.  He is not having any weight gain.  He is eating well.  He does have chronic lower extremity swelling and has a small ulcer that is being dressed by some home health nurses.  Has had physical therapy at home.  Past Medical History:  Diagnosis Date   Arthritis    Atrial fibrillation [facility])    Balance problem 10/31/2013   BPH (benign prostatic hyperplasia) 2014   Urology Dr [name] with [facility] [phone]    Bradycardia    a. baseline HR in the 30's. Asymptomatic - no history of PPM placement.    CAD (coronary artery disease)    LHC 03/16/11: Mid LAD 10-20%, proximal circumflex 10%, mid RCA 20%, EF 55%.   Cataract    Complete heart block (HCC)    Fracture of femoral neck, right ([place]) 04/19/2017   GERD (gastroesophageal reflux disease)    takes occasional  zantac   Hx of echocardiogram    Echo 02/2011: EF 65-70%, normal wall motion, MAC, mild MR, mild LAE, mild RVE   Hypertension    OSA (obstructive sleep apnea)    per patient went to have sleep study done about 2 years ago, bu they never F/U with him about results; but wife reports he still has periods of apnea at  night    Talar fracture    casted no surg   UTI (urinary tract infection) 10/2020    Past Surgical History:  Procedure Laterality Date   ABDOMINAL AORTOGRAM W/LOWER EXTREMITY N/A 01/17/2017   Procedure: ABDOMINAL AORTOGRAM W/LOWER EXTREMITY;  Surgeon: Angelia Mould, MD;  Location: [place] [facility];  Service: Cardiovascular;  Laterality: N/A;   COLONOSCOPY     Repeated and normal in 2011   FRACTURE SURGERY     HEMIARTHROPLASTY HIP Right 04/19/2017   HEMORROIDECTOMY     HIP ARTHROPLASTY Right 04/19/2017   Procedure: ARTHROPLASTY BIPOLAR HIP (HEMIARTHROPLASTY);  Surgeon: Marchia Bond, MD;  Location: [place];  Service: Orthopedics;  Laterality: Right;   HIP ARTHROPLASTY Left 12/16/2018   Procedure: ARTHROPLASTY BIPOLAR HIP (HEMIARTHROPLASTY);  Surgeon: Altamese Livingston, MD;  Location: [place];  Service: Orthopedics;  Laterality: Left;   INGUINAL HERNIA REPAIR Right 02/26/2014   Procedure: LAPAROSCOPIC RIGHT INGUINAL HERNIA REPAIR;  Surgeon: Ralene Ok, MD;  Location: [place];  Service: General;  Laterality: Right;   INGUINAL HERNIA REPAIR     INSERTION OF MESH Right 02/26/2014   Procedure: INSERTION OF MESH;  Surgeon: Ralene Ok, MD;  Location:  [place] [facility];  Service: General;  Laterality: Right;   LEFT HEART CATHETERIZATION WITH CORONARY ANGIOGRAM N/A 03/16/2011   Procedure: LEFT HEART CATHETERIZATION WITH CORONARY ANGIOGRAM;  Surgeon: Peter M Martinique, MD;  Location: [facility] CATH LAB;  Service: Cardiovascular;  Laterality: N/A;   PROSTATE BIOPSY     negative - [facility]   TRANSURETHRAL RESECTION OF PROSTATE N/A 09/07/2016   Procedure: TRANSURETHRAL RESECTION OF THE PROSTATE (TURP);  Surgeon: Irine Seal, MD;  [facility];  Service: Urology;  Laterality: N/A;     Current Outpatient Medications  Medication Sig Dispense Refill   acetaminophen (TYLENOL) 500 MG tablet Take 1 tablet (500 mg total) by mouth every 8 (eight) hours. (Patient taking differently: Take 500 mg by mouth every  8 (eight) hours as needed for moderate pain.) 30 tablet 0   calcium carbonate (TUMS - DOSED IN MG ELEMENTAL CALCIUM) 500 MG chewable tablet Chew 1 tablet (200 mg of elemental calcium total) by mouth 2 (two) times daily with a meal. (Patient taking differently: Chew 1 tablet by mouth as needed.)     carbidopa-levodopa (SINEMET) 25-100 MG tablet Take 1.5 tablets by mouth in the morning and at bedtime. 270 tablet 3   docusate sodium (COLACE) 100 MG capsule Take 200 mg by mouth as needed for mild constipation.     feeding supplement (ENSURE ENLIVE / ENSURE PLUS) LIQD Take 237 mLs by mouth daily. 237 mL 12   lisinopril (ZESTRIL) 10 MG tablet Take 1 tablet (10 mg total) by mouth daily. 90 tablet 3   Misc Natural Products (PROSTATE) CAPS Take 1 tablet by mouth 2 (two) times daily. Prostate Plus otc supplement     Multiple Vitamins-Minerals (PRESERVISION AREDS 2+MULTI VIT PO) Take 1 capsule by mouth 2 (two) times daily.     rivaroxaban (XARELTO) 20 MG TABS tablet TAKE ONE TABLET BY MOUTH DAILY WITH SUPPER (Patient taking differently: Take 20 mg by mouth daily with supper.) 90 tablet 3   senna (SENOKOT) 8.6 MG TABS tablet Take 1 tablet by mouth daily as needed for mild constipation.     timolol (TIMOPTIC) 0.5 % ophthalmic solution INSTILL ONE DROP IN [facility] EYE DAILY (Patient taking differently: Place 1 drop into both eyes 2 (two) times daily.) 10 mL 5   vitamin C (ASCORBIC ACID) 500 MG tablet Take 500 mg by mouth daily.     No current facility-administered medications for this visit.    Allergies:   Doxycycline and Keflex [cephalexin]   ROS:  Please see the history of present illness.   Otherwise, review of systems are positive for none.   All other systems are reviewed and negative.    PHYSICAL EXAM: VS:  BP (!) 157/76   Pulse (!) 38   Ht 6' (1.829 m)   Wt 170 lb (77.1 kg) Comment: Per patient  SpO2 98%   BMI 23.06 kg/m  , BMI Body mass index is 23.06 kg/m. GEN:  No distress, frail NECK:  No  jugular venous distention at 90 degrees, waveform within normal limits, carotid upstroke brisk and symmetric, no bruits, no thyromegaly LYMPHATICS:  No cervical adenopathy LUNGS:  Clear to auscultation bilaterally BACK:  No CVA tenderness CHEST:  Unremarkable HEART:  S1 and S2 within normal limits, no S3, no S4, no clicks, no rubs, no murmurs ABD:  Positive bowel sounds normal in frequency in pitch, no bruits, no rebound, no guarding, unable to assess midline mass or bruit with the patient seated. EXT:  2 plus pulses throughout,  moderate edema, no cyanosis no clubbing SKIN:  No rashes no nodules PSYCH:  Cognitively intact, oriented to person place and time  EKG:  EKG not ordered today.    Recent Labs: 10/19/2020: ALT 6; BUN 28; Creatinine, Ser 1.09; Hemoglobin 10.0; Magnesium 2.0; Platelets 200; Potassium 4.1; Sodium 139    Lipid Panel    Component Value Date/Time   CHOL 147 11/09/2016 1746   TRIG 55 11/09/2016 1746   HDL 71 11/09/2016 1746   CHOLHDL 2.1 11/09/2016 1746   CHOLHDL 2.4 03/16/2011 0510   VLDL 11 03/16/2011 0510   LDLCALC 65 11/09/2016 1746      Wt Readings from Last 3 Encounters:  12/25/20 170 lb (77.1 kg)  12/12/20 170 lb (77.1 kg)  10/24/20 175 lb (79.4 kg)      Other studies Reviewed: Additional studies/ records that were reviewed today include: Hospital records reviewed Review of the above records demonstrates:  Please see elsewhere in the note.     ASSESSMENT AND PLAN:  ATRIAL FIBRILLATION:   Mr. ZORAN YANKEE has a CHA2DS2 - VASc score of 3.   He tolerates anticoagulation and his creatinine demonstrates that he is on the appropriate dose.  He has no bleeding contraindications.  No change in therapy.    COMPLETE HEART BLOCK: He has had complete heart block for years without indication for a pacemaker.  He is tolerated this very slow heart rate and has an excellent blood pressure.  He would not want to consider a pacemaker at this point given his  frailty.  No change in therapy.   HTN:   His blood pressure is mildly elevated.  No change in therapy.   SOCIAL: It is hard to imagine how he and his wife are coping at home with her advanced age and frailty and no outside support other than the physical therapy which is about to expire.  Her caseworker will be coming to their home and I will follow-up to see if there are any alternatives for retirement home placement or other such help.  I think they are at risk for poor outcomes particularly with falls.   Current medicines are reviewed at length with the patient today.  The patient does not have concerns regarding medicines.  The following changes have been made:  None  Labs/ tests ordered today include: None  No orders of the defined types were placed in this encounter.    Disposition:   FU with me in 12 months.    Signed, Minus Breeding, MD  12/25/2020 5:11 PM    [facility]

## 2020-12-24 ENCOUNTER — Telehealth: Payer: Self-pay | Admitting: Cardiology

## 2020-12-24 NOTE — Telephone Encounter (Signed)
Message routed to nurse covering for Dr. Percival Spanish tomorrow

## 2020-12-24 NOTE — Telephone Encounter (Signed)
Patient's wife requesting someone come to the parking lot to get the patient for his appointment tomorrow. She says he needs a wheelchair, but she cannot push him because she uses a rollator.

## 2020-12-25 ENCOUNTER — Encounter: Payer: Self-pay | Admitting: Cardiology

## 2020-12-25 ENCOUNTER — Other Ambulatory Visit: Payer: Self-pay

## 2020-12-25 ENCOUNTER — Ambulatory Visit (INDEPENDENT_AMBULATORY_CARE_PROVIDER_SITE_OTHER): Payer: Medicare HMO | Admitting: Cardiology

## 2020-12-25 VITALS — BP 157/76 | HR 38 | Ht 72.0 in | Wt 170.0 lb

## 2020-12-25 DIAGNOSIS — I442 Atrioventricular block, complete: Secondary | ICD-10-CM | POA: Diagnosis not present

## 2020-12-25 DIAGNOSIS — I482 Chronic atrial fibrillation, unspecified: Secondary | ICD-10-CM

## 2020-12-25 DIAGNOSIS — I10 Essential (primary) hypertension: Secondary | ICD-10-CM | POA: Diagnosis not present

## 2020-12-25 DIAGNOSIS — N39498 Other specified urinary incontinence: Secondary | ICD-10-CM | POA: Diagnosis not present

## 2020-12-25 DIAGNOSIS — I480 Paroxysmal atrial fibrillation: Secondary | ICD-10-CM | POA: Diagnosis not present

## 2020-12-25 DIAGNOSIS — N401 Enlarged prostate with lower urinary tract symptoms: Secondary | ICD-10-CM | POA: Diagnosis not present

## 2020-12-25 DIAGNOSIS — G20 Parkinson's disease: Secondary | ICD-10-CM | POA: Diagnosis not present

## 2020-12-25 DIAGNOSIS — I459 Conduction disorder, unspecified: Secondary | ICD-10-CM | POA: Diagnosis not present

## 2020-12-25 DIAGNOSIS — M79672 Pain in left foot: Secondary | ICD-10-CM | POA: Diagnosis not present

## 2020-12-25 DIAGNOSIS — H353221 Exudative age-related macular degeneration, left eye, with active choroidal neovascularization: Secondary | ICD-10-CM | POA: Diagnosis not present

## 2020-12-25 DIAGNOSIS — I739 Peripheral vascular disease, unspecified: Secondary | ICD-10-CM | POA: Diagnosis not present

## 2020-12-25 DIAGNOSIS — M79671 Pain in right foot: Secondary | ICD-10-CM | POA: Diagnosis not present

## 2020-12-25 NOTE — Patient Instructions (Signed)

## 2020-12-25 NOTE — Progress Notes (Signed)
Subjective 87 year old male with history of complete heart block and atrial fibrillation presents for routine follow up. Had a recent hospitalization for a UTI, then participated in rehab. Returned home 3 weeks ago. Patient states that he feel pretty happy about himself. Wife says that she has noticed that his feet have been swelling throughout his leg since he returned home 3 weeks ago. Endorsing bilateral foot pain as well. Ankle had an abrasion and then became a sore later, had a home nurse who did routine wound care dressings. Compliant on all medications including lisinopril and xarelto. Denies chest pain, dyspnea, palpitations and syncopal episodes. Checks BP at home, usually within normal range with systolic BP 569A and diastolic 37-00F.

## 2020-12-26 ENCOUNTER — Telehealth: Payer: Self-pay | Admitting: Family Medicine

## 2020-12-26 DIAGNOSIS — I480 Paroxysmal atrial fibrillation: Secondary | ICD-10-CM | POA: Diagnosis not present

## 2020-12-26 DIAGNOSIS — I739 Peripheral vascular disease, unspecified: Secondary | ICD-10-CM | POA: Diagnosis not present

## 2020-12-26 DIAGNOSIS — I459 Conduction disorder, unspecified: Secondary | ICD-10-CM | POA: Diagnosis not present

## 2020-12-26 DIAGNOSIS — G20 Parkinson's disease: Secondary | ICD-10-CM | POA: Diagnosis not present

## 2020-12-26 DIAGNOSIS — M79672 Pain in left foot: Secondary | ICD-10-CM | POA: Diagnosis not present

## 2020-12-26 DIAGNOSIS — N401 Enlarged prostate with lower urinary tract symptoms: Secondary | ICD-10-CM | POA: Diagnosis not present

## 2020-12-26 DIAGNOSIS — M79671 Pain in right foot: Secondary | ICD-10-CM | POA: Diagnosis not present

## 2020-12-26 DIAGNOSIS — H353221 Exudative age-related macular degeneration, left eye, with active choroidal neovascularization: Secondary | ICD-10-CM | POA: Diagnosis not present

## 2020-12-26 DIAGNOSIS — N39498 Other specified urinary incontinence: Secondary | ICD-10-CM | POA: Diagnosis not present

## 2020-12-26 NOTE — Telephone Encounter (Signed)
I called the provided number, unable to leave voicemail.  We will try to reach again on Monday.

## 2020-12-26 NOTE — Telephone Encounter (Signed)
..  Home Health Verbal Orders  Agency:  [facility]  Caller: (Contact and title)Alden Alroy Dust Call back #: [phone] Fax #:[phone]    Requesting OT/ PT/ Skilled nursing/ Social Work/ Speech:  Social worker evaluation  Reason for Request:  Help patient with community resources  Frequency:  1 week - one time    [facility] needs F2F w/in last 30 days

## 2020-12-29 DIAGNOSIS — G20 Parkinson's disease: Secondary | ICD-10-CM | POA: Diagnosis not present

## 2020-12-29 DIAGNOSIS — I480 Paroxysmal atrial fibrillation: Secondary | ICD-10-CM | POA: Diagnosis not present

## 2020-12-29 DIAGNOSIS — N39498 Other specified urinary incontinence: Secondary | ICD-10-CM | POA: Diagnosis not present

## 2020-12-29 DIAGNOSIS — H353221 Exudative age-related macular degeneration, left eye, with active choroidal neovascularization: Secondary | ICD-10-CM | POA: Diagnosis not present

## 2020-12-29 DIAGNOSIS — M79671 Pain in right foot: Secondary | ICD-10-CM | POA: Diagnosis not present

## 2020-12-29 DIAGNOSIS — M79672 Pain in left foot: Secondary | ICD-10-CM | POA: Diagnosis not present

## 2020-12-29 DIAGNOSIS — N401 Enlarged prostate with lower urinary tract symptoms: Secondary | ICD-10-CM | POA: Diagnosis not present

## 2020-12-29 DIAGNOSIS — I739 Peripheral vascular disease, unspecified: Secondary | ICD-10-CM | POA: Diagnosis not present

## 2020-12-29 DIAGNOSIS — I459 Conduction disorder, unspecified: Secondary | ICD-10-CM | POA: Diagnosis not present

## 2020-12-30 DIAGNOSIS — M79672 Pain in left foot: Secondary | ICD-10-CM | POA: Diagnosis not present

## 2020-12-30 DIAGNOSIS — N39498 Other specified urinary incontinence: Secondary | ICD-10-CM | POA: Diagnosis not present

## 2020-12-30 DIAGNOSIS — G20 Parkinson's disease: Secondary | ICD-10-CM | POA: Diagnosis not present

## 2020-12-30 DIAGNOSIS — I480 Paroxysmal atrial fibrillation: Secondary | ICD-10-CM | POA: Diagnosis not present

## 2020-12-30 DIAGNOSIS — M79671 Pain in right foot: Secondary | ICD-10-CM | POA: Diagnosis not present

## 2020-12-30 DIAGNOSIS — N401 Enlarged prostate with lower urinary tract symptoms: Secondary | ICD-10-CM | POA: Diagnosis not present

## 2020-12-30 DIAGNOSIS — I739 Peripheral vascular disease, unspecified: Secondary | ICD-10-CM | POA: Diagnosis not present

## 2020-12-30 DIAGNOSIS — I459 Conduction disorder, unspecified: Secondary | ICD-10-CM | POA: Diagnosis not present

## 2020-12-30 DIAGNOSIS — H353221 Exudative age-related macular degeneration, left eye, with active choroidal neovascularization: Secondary | ICD-10-CM | POA: Diagnosis not present

## 2020-12-30 NOTE — Telephone Encounter (Signed)
Verbal order given  

## 2020-12-31 DIAGNOSIS — I459 Conduction disorder, unspecified: Secondary | ICD-10-CM | POA: Diagnosis not present

## 2020-12-31 DIAGNOSIS — M79671 Pain in right foot: Secondary | ICD-10-CM | POA: Diagnosis not present

## 2020-12-31 DIAGNOSIS — N401 Enlarged prostate with lower urinary tract symptoms: Secondary | ICD-10-CM | POA: Diagnosis not present

## 2020-12-31 DIAGNOSIS — N39498 Other specified urinary incontinence: Secondary | ICD-10-CM | POA: Diagnosis not present

## 2020-12-31 DIAGNOSIS — M79672 Pain in left foot: Secondary | ICD-10-CM | POA: Diagnosis not present

## 2020-12-31 DIAGNOSIS — G20 Parkinson's disease: Secondary | ICD-10-CM | POA: Diagnosis not present

## 2020-12-31 DIAGNOSIS — I480 Paroxysmal atrial fibrillation: Secondary | ICD-10-CM | POA: Diagnosis not present

## 2020-12-31 DIAGNOSIS — H353221 Exudative age-related macular degeneration, left eye, with active choroidal neovascularization: Secondary | ICD-10-CM | POA: Diagnosis not present

## 2020-12-31 DIAGNOSIS — I739 Peripheral vascular disease, unspecified: Secondary | ICD-10-CM | POA: Diagnosis not present

## 2021-01-01 DIAGNOSIS — N401 Enlarged prostate with lower urinary tract symptoms: Secondary | ICD-10-CM | POA: Diagnosis not present

## 2021-01-01 DIAGNOSIS — I739 Peripheral vascular disease, unspecified: Secondary | ICD-10-CM | POA: Diagnosis not present

## 2021-01-01 DIAGNOSIS — I459 Conduction disorder, unspecified: Secondary | ICD-10-CM | POA: Diagnosis not present

## 2021-01-01 DIAGNOSIS — H353221 Exudative age-related macular degeneration, left eye, with active choroidal neovascularization: Secondary | ICD-10-CM | POA: Diagnosis not present

## 2021-01-01 DIAGNOSIS — M79672 Pain in left foot: Secondary | ICD-10-CM | POA: Diagnosis not present

## 2021-01-01 DIAGNOSIS — G20 Parkinson's disease: Secondary | ICD-10-CM | POA: Diagnosis not present

## 2021-01-01 DIAGNOSIS — I480 Paroxysmal atrial fibrillation: Secondary | ICD-10-CM | POA: Diagnosis not present

## 2021-01-01 DIAGNOSIS — N39498 Other specified urinary incontinence: Secondary | ICD-10-CM | POA: Diagnosis not present

## 2021-01-01 DIAGNOSIS — M79671 Pain in right foot: Secondary | ICD-10-CM | POA: Diagnosis not present

## 2021-01-02 DIAGNOSIS — G20 Parkinson's disease: Secondary | ICD-10-CM | POA: Diagnosis not present

## 2021-01-02 DIAGNOSIS — I459 Conduction disorder, unspecified: Secondary | ICD-10-CM | POA: Diagnosis not present

## 2021-01-02 DIAGNOSIS — N39498 Other specified urinary incontinence: Secondary | ICD-10-CM | POA: Diagnosis not present

## 2021-01-02 DIAGNOSIS — I480 Paroxysmal atrial fibrillation: Secondary | ICD-10-CM | POA: Diagnosis not present

## 2021-01-02 DIAGNOSIS — H353221 Exudative age-related macular degeneration, left eye, with active choroidal neovascularization: Secondary | ICD-10-CM | POA: Diagnosis not present

## 2021-01-02 DIAGNOSIS — M79671 Pain in right foot: Secondary | ICD-10-CM | POA: Diagnosis not present

## 2021-01-02 DIAGNOSIS — M79672 Pain in left foot: Secondary | ICD-10-CM | POA: Diagnosis not present

## 2021-01-02 DIAGNOSIS — N401 Enlarged prostate with lower urinary tract symptoms: Secondary | ICD-10-CM | POA: Diagnosis not present

## 2021-01-02 DIAGNOSIS — I739 Peripheral vascular disease, unspecified: Secondary | ICD-10-CM | POA: Diagnosis not present

## 2021-01-05 DIAGNOSIS — I459 Conduction disorder, unspecified: Secondary | ICD-10-CM | POA: Diagnosis not present

## 2021-01-05 DIAGNOSIS — G20 Parkinson's disease: Secondary | ICD-10-CM | POA: Diagnosis not present

## 2021-01-05 DIAGNOSIS — N39498 Other specified urinary incontinence: Secondary | ICD-10-CM | POA: Diagnosis not present

## 2021-01-05 DIAGNOSIS — N401 Enlarged prostate with lower urinary tract symptoms: Secondary | ICD-10-CM | POA: Diagnosis not present

## 2021-01-05 DIAGNOSIS — H353221 Exudative age-related macular degeneration, left eye, with active choroidal neovascularization: Secondary | ICD-10-CM | POA: Diagnosis not present

## 2021-01-05 DIAGNOSIS — M79671 Pain in right foot: Secondary | ICD-10-CM | POA: Diagnosis not present

## 2021-01-05 DIAGNOSIS — I480 Paroxysmal atrial fibrillation: Secondary | ICD-10-CM | POA: Diagnosis not present

## 2021-01-05 DIAGNOSIS — I739 Peripheral vascular disease, unspecified: Secondary | ICD-10-CM | POA: Diagnosis not present

## 2021-01-05 DIAGNOSIS — M79672 Pain in left foot: Secondary | ICD-10-CM | POA: Diagnosis not present

## 2021-01-06 ENCOUNTER — Other Ambulatory Visit: Payer: Self-pay | Admitting: Cardiology

## 2021-01-06 DIAGNOSIS — M79671 Pain in right foot: Secondary | ICD-10-CM | POA: Diagnosis not present

## 2021-01-06 DIAGNOSIS — I459 Conduction disorder, unspecified: Secondary | ICD-10-CM | POA: Diagnosis not present

## 2021-01-06 DIAGNOSIS — N401 Enlarged prostate with lower urinary tract symptoms: Secondary | ICD-10-CM | POA: Diagnosis not present

## 2021-01-06 DIAGNOSIS — I480 Paroxysmal atrial fibrillation: Secondary | ICD-10-CM | POA: Diagnosis not present

## 2021-01-06 DIAGNOSIS — N39498 Other specified urinary incontinence: Secondary | ICD-10-CM | POA: Diagnosis not present

## 2021-01-06 DIAGNOSIS — G20 Parkinson's disease: Secondary | ICD-10-CM | POA: Diagnosis not present

## 2021-01-06 DIAGNOSIS — H353221 Exudative age-related macular degeneration, left eye, with active choroidal neovascularization: Secondary | ICD-10-CM | POA: Diagnosis not present

## 2021-01-06 DIAGNOSIS — M79672 Pain in left foot: Secondary | ICD-10-CM | POA: Diagnosis not present

## 2021-01-06 DIAGNOSIS — I739 Peripheral vascular disease, unspecified: Secondary | ICD-10-CM | POA: Diagnosis not present

## 2021-01-06 NOTE — Telephone Encounter (Signed)
Prescription refill request for Xarelto received.  Indication: Afib  Last office visit:12/25/20 ([place])  Weight:77.1kg Age:87 Scr:0.96 (10/18/20)  CrCl: 53.77ml/min  Appropriate dose and refill sent to requested pharmacy.

## 2021-01-07 ENCOUNTER — Encounter (HOSPITAL_COMMUNITY): Payer: Self-pay

## 2021-01-07 ENCOUNTER — Telehealth (INDEPENDENT_AMBULATORY_CARE_PROVIDER_SITE_OTHER): Payer: Medicare HMO | Admitting: Family Medicine

## 2021-01-07 ENCOUNTER — Emergency Department (HOSPITAL_COMMUNITY)
Admission: EM | Admit: 2021-01-07 | Discharge: 2021-01-08 | Disposition: A | Discharge status: Home / Self Care | Payer: Medicare HMO | Attending: Emergency Medicine | Admitting: Emergency Medicine

## 2021-01-07 DIAGNOSIS — I119 Hypertensive heart disease without heart failure: Secondary | ICD-10-CM | POA: Insufficient documentation

## 2021-01-07 DIAGNOSIS — R531 Weakness: Secondary | ICD-10-CM | POA: Diagnosis not present

## 2021-01-07 DIAGNOSIS — S299XXA Unspecified injury of thorax, initial encounter: Secondary | ICD-10-CM | POA: Diagnosis present

## 2021-01-07 DIAGNOSIS — Z79899 Other long term (current) drug therapy: Secondary | ICD-10-CM | POA: Diagnosis not present

## 2021-01-07 DIAGNOSIS — I739 Peripheral vascular disease, unspecified: Secondary | ICD-10-CM | POA: Diagnosis not present

## 2021-01-07 DIAGNOSIS — I2510 Atherosclerotic heart disease of native coronary artery without angina pectoris: Secondary | ICD-10-CM | POA: Diagnosis not present

## 2021-01-07 DIAGNOSIS — Z87440 Personal history of urinary (tract) infections: Secondary | ICD-10-CM | POA: Diagnosis not present

## 2021-01-07 DIAGNOSIS — M79671 Pain in right foot: Secondary | ICD-10-CM | POA: Diagnosis not present

## 2021-01-07 DIAGNOSIS — Z87891 Personal history of nicotine dependence: Secondary | ICD-10-CM | POA: Insufficient documentation

## 2021-01-07 DIAGNOSIS — W19XXXA Unspecified fall, initial encounter: Secondary | ICD-10-CM | POA: Diagnosis not present

## 2021-01-07 DIAGNOSIS — N39498 Other specified urinary incontinence: Secondary | ICD-10-CM | POA: Diagnosis not present

## 2021-01-07 DIAGNOSIS — Z96643 Presence of artificial hip joint, bilateral: Secondary | ICD-10-CM | POA: Insufficient documentation

## 2021-01-07 DIAGNOSIS — S3011XA Contusion of abdominal wall, initial encounter: Secondary | ICD-10-CM

## 2021-01-07 DIAGNOSIS — N179 Acute kidney failure, unspecified: Secondary | ICD-10-CM

## 2021-01-07 DIAGNOSIS — I4891 Unspecified atrial fibrillation: Secondary | ICD-10-CM | POA: Insufficient documentation

## 2021-01-07 DIAGNOSIS — S301XXA Contusion of abdominal wall, initial encounter: Secondary | ICD-10-CM | POA: Insufficient documentation

## 2021-01-07 DIAGNOSIS — I459 Conduction disorder, unspecified: Secondary | ICD-10-CM | POA: Diagnosis not present

## 2021-01-07 DIAGNOSIS — I700 Atherosclerosis of aorta: Secondary | ICD-10-CM | POA: Diagnosis not present

## 2021-01-07 DIAGNOSIS — G20 Parkinson's disease: Secondary | ICD-10-CM

## 2021-01-07 DIAGNOSIS — H353221 Exudative age-related macular degeneration, left eye, with active choroidal neovascularization: Secondary | ICD-10-CM | POA: Diagnosis not present

## 2021-01-07 DIAGNOSIS — Z8616 Personal history of COVID-19: Secondary | ICD-10-CM | POA: Diagnosis not present

## 2021-01-07 DIAGNOSIS — S20211A Contusion of right front wall of thorax, initial encounter: Secondary | ICD-10-CM | POA: Diagnosis not present

## 2021-01-07 DIAGNOSIS — S3991XA Unspecified injury of abdomen, initial encounter: Secondary | ICD-10-CM | POA: Diagnosis not present

## 2021-01-07 DIAGNOSIS — Z7901 Long term (current) use of anticoagulants: Secondary | ICD-10-CM

## 2021-01-07 DIAGNOSIS — M545 Low back pain, unspecified: Secondary | ICD-10-CM | POA: Diagnosis not present

## 2021-01-07 DIAGNOSIS — D539 Nutritional anemia, unspecified: Secondary | ICD-10-CM | POA: Diagnosis not present

## 2021-01-07 DIAGNOSIS — M79672 Pain in left foot: Secondary | ICD-10-CM | POA: Diagnosis not present

## 2021-01-07 DIAGNOSIS — M47816 Spondylosis without myelopathy or radiculopathy, lumbar region: Secondary | ICD-10-CM | POA: Diagnosis not present

## 2021-01-07 DIAGNOSIS — I480 Paroxysmal atrial fibrillation: Secondary | ICD-10-CM | POA: Diagnosis not present

## 2021-01-07 DIAGNOSIS — N401 Enlarged prostate with lower urinary tract symptoms: Secondary | ICD-10-CM | POA: Diagnosis not present

## 2021-01-07 NOTE — ED Triage Notes (Signed)
Patient is from home and lives with his wife. He was brought in by EMS for lower right back pain. Wife reports that the patient was sneezing frequently and the back pain occurred soon after. Patient takes Xarelto.

## 2021-01-07 NOTE — Progress Notes (Signed)
Virtual Visit via Video Note  I connected with Chad Russell on 01/07/21 at 5:47 PM by a video enabled telemedicine application and verified that I am speaking with the correct person using two identifiers.  87mn initial chart review. Initial am appointment - 3 min attempt in connecting - able to see on video but unstable video, no audio. Called on phone - they were unable to hear me. Repeat video attempt at 1:10pm - not available.   Rescheduled for later in day.   Patient location: home, with spouse PMardene Celeste- consent obtained.  My location: office - [place].    I discussed the limitations, risks, security and privacy concerns of performing an evaluation and management service by telephone and the availability of in person appointments. I also discussed with the patient that there may be a patient responsible charge related to this service. The patient expressed understanding and agreed to proceed, consent obtained  Chief complaint: Chief Complaint  Patient presents with   Hospitalization Follow-up    Pt reports no sxs but would like urine to recheck for any lingering bacteria, pt reports doing well with rehab,     History of Present Illness: CUraI SKishiis a 87y.o. male  History of complete heart block, Parkinson's, oropharyngeal dysphagia,BPH with lower urinary tract symptoms, MGUS, history of MRI, OSA, GERD. He was admitted August 23 through August 30 with generalized weakness.  Poor oral intake.     UTI Ultimately diagnosed with acute Staphylococcus simulans UTI.  Negative blood cultures.  Initially had been on IV ceftriaxone, changed to Keflex 500 mg 3 times daily after infectious disease consult.  PT/OT eval recommended SNF, [facility]  No fever.  No dysuria. Feels generally weak, past 6 months. About the same. Urine is not cloudy like it was when had UTI. Does not feel bad, just generally weak - off and on. No hematuria. No change in frequency. No new  abd pain. Producing urine.   AKI on CKD stage IIIA To be due to prerenal azotemia likely from dehydration.  Peak creatinine 1.95 on 10/14/20. Lisinopril, HCTZ were held then resumed at discharge.  Avoidance of nephrotoxic medications, IV hydration initially treated. Home blood pressures  108/65. Sometimes lower - under 100 sometimes when at rehab, but not recently.  BP 157/76 at cardiology 12/25/20, only on lisinopril a that time - no meds added. Not taking HCTZ.  Spouse trying to encourage fluids, but some difficulty with fluids.   Lab Results  Component Value Date   CREATININE 1.09 10/19/2020   Hypophosphatemia Phosphate low at 2.2, improved to 2.7 after repletion during hospitalization.  Recommended outpatient monitoring.  Has not had rechecked.   Generalized weakness Thought to be related to infection.  Underlying Parkinson's disease treated with carbidopa/levodopa.  [facility]OT [facility] Since that time has been cared for at home with his spouse, has physical therapy in place. He has had some progressive weakness over time, some days worse than others. Better in am, then weak as day went on, needing more assistance with movement. Noted the weakness past few months.  Trouble standing on own at times,  Speech therapy has been helpful with difficulty with swallowing. Still some cough. No new cough. No fever.  Neurology Dr. PPosey Pronto- last call in April 2021.   Macrocytic anemia During hospitalization hemoglobin 10.7 up to 11.5, down to a low of 9.8 then 10.0.  Plan for outpatient monitoring. Lab  Results  Component Value Date   WBC 7.9 10/19/2020   HGB 10.0 (L) 10/19/2020   HCT 30.7 (L) 10/19/2020   MCV 98.7 10/19/2020   PLT 200 10/19/2020   Hx of complete heart block, afib, recent eval 11/3 by cardiology, Dr. Percival Spanish, tolerating anticoagulation, no bleeding. Has tolerated very slow HR for years.  History of GERD with evaluation  by gastroenterologist October 21.  Symptoms of heartburn, throat irritation, some coughing episodes and oropharyngeal dysphagia.  Status post speech pathology eval and certain foods have been implemented to reduce symptoms.  Continued on Tums as needed for heartburn versus trying Pepcid.  No recommendation for invasive testing or testing for GERD at this time.  Continue thickened liquids and head tilting with swallowing.   Patient Active Problem List   Diagnosis Date Noted   UTI (urinary tract infection) 10/14/2020   Bradycardia 10/14/2020   Educated about COVID-19 virus infection 11/29/2019   Exudative age-related macular degeneration of left eye with active choroidal neovascularization ([place]) 10/02/2019   Central retinal vein occlusion with macular edema of right eye 06/20/2019   Central retinal vein occlusion with neovascularization of right eye 06/20/2019   Ocular ischemic syndrome 06/20/2019   Retinal hemorrhage of left eye 06/20/2019   Retinal hemorrhage of right eye 06/20/2019   AKI (acute kidney injury) ([place]) 12/15/2018   Hematoma of scalp    Heart block    Chest pain 05/04/2018   Fracture of femoral neck, left ([place]) 04/19/2017   MGUS (monoclonal gammopathy of unknown significance) 01/15/2017   BPH with obstruction/lower urinary tract symptoms 09/07/2016   Preoperative cardiovascular examination 08/24/2016   Urinary retention 05/13/2016   Balance problem 10/31/2013   OSA (obstructive sleep apnea) 10/31/2013   BPH (benign prostatic hyperplasia) 08/28/2012   Atrial fibrillation ([place]) 06/08/2012   Hyperplasia of prostate with lower urinary tract symptoms (LUTS) 04/28/2012   Moderate malnutrition ([place]) 04/28/2012   Infection of urinary tract - recurrent 04/26/2012   NSTEMI (non-ST elevated myocardial infarction) ([place]) 03/15/2011   Complete heart block ([place]) 03/15/2011   HTN (hypertension) 03/15/2011   Past Medical History:  Diagnosis Date   Arthritis    Atrial fibrillation  ([place])    Balance problem 10/31/2013   BPH (benign prostatic hyperplasia) 2014   Urology Dr [name] with [facility] [phone]    Bradycardia    a. baseline HR in the 30's. Asymptomatic - no history of PPM placement.    CAD (coronary artery disease)    LHC 03/16/11: Mid LAD 10-20%, proximal circumflex 10%, mid RCA 20%, EF 55%.   Cataract    Complete heart block (HCC)    Fracture of femoral neck, right ([place]) 04/19/2017   GERD (gastroesophageal reflux disease)    takes occasional  zantac   Hx of echocardiogram    Echo 02/2011: EF 65-70%, normal wall motion, MAC, mild MR, mild LAE, mild RVE   Hypertension    OSA (obstructive sleep apnea)    per patient went to have sleep study done about 2 years ago, bu they never F/U with him about results; but wife reports he still has periods of apnea at night    Talar fracture    casted no surg   UTI (urinary tract infection) 10/2020   Past Surgical History:  Procedure Laterality Date   ABDOMINAL AORTOGRAM W/LOWER EXTREMITY N/A 01/17/2017   Procedure: ABDOMINAL AORTOGRAM W/LOWER EXTREMITY;  Surgeon: Angelia Mould, MD;  Location: [facility];  Service: Cardiovascular;  Laterality: N/A;  COLONOSCOPY     Repeated and normal in 2011   FRACTURE SURGERY     HEMIARTHROPLASTY HIP Right 04/19/2017   HEMORROIDECTOMY     HIP ARTHROPLASTY Right 04/19/2017   Procedure: ARTHROPLASTY BIPOLAR HIP (HEMIARTHROPLASTY);  Surgeon: Marchia Bond, MD;  Location: [facility];  Service: Orthopedics;  Laterality: Right;   HIP ARTHROPLASTY Left 12/16/2018   Procedure: ARTHROPLASTY BIPOLAR HIP (HEMIARTHROPLASTY);  Surgeon: Altamese [place]ville, MD;  Location: [facility];  Service: Orthopedics;  Laterality: Left;   INGUINAL HERNIA REPAIR Right 02/26/2014   Procedure: LAPAROSCOPIC RIGHT INGUINAL HERNIA REPAIR;  Surgeon: Ralene Ok, MD;  Location: [facility];  Service: General;  Laterality: Right;   INGUINAL HERNIA REPAIR     INSERTION OF MESH Right 02/26/2014   Procedure: INSERTION OF  MESH;  Surgeon: Ralene Ok, MD;  Location: [facility];  Service: General;  Laterality: Right;   LEFT HEART CATHETERIZATION WITH CORONARY ANGIOGRAM N/A 03/16/2011   Procedure: LEFT HEART CATHETERIZATION WITH CORONARY ANGIOGRAM;  Surgeon: Peter M Martinique, MD;  Location: [facility];  Service: Cardiovascular;  Laterality: N/A;   PROSTATE BIOPSY     negative - [facility]   TRANSURETHRAL RESECTION OF PROSTATE N/A 09/07/2016   Procedure: TRANSURETHRAL RESECTION OF THE PROSTATE (TURP);  Surgeon: Irine Seal, MD;  Location: [facility];  Service: Urology;  Laterality: N/A;   Allergies  Allergen Reactions   Doxycycline Other (See Comments)    Upset stomach   Keflex [Cephalexin] Other (See Comments)    Abdominal discomfort 04-19-17 Pt has tolerated orally   Prior to Admission medications   Medication Sig Start Date End Date Taking? Authorizing Provider  acetaminophen (TYLENOL) 500 MG tablet Take 1 tablet (500 mg total) by mouth every 8 (eight) hours. Patient taking differently: Take 500 mg by mouth every 8 (eight) hours as needed for moderate pain. 12/26/18  Yes Debbe Odea, MD  calcium carbonate (TUMS - DOSED IN MG ELEMENTAL CALCIUM) 500 MG chewable tablet Chew 1 tablet (200 mg of elemental calcium total) by mouth 2 (two) times daily with a meal. Patient taking differently: Chew 1 tablet by mouth as needed. 10/20/20  Yes Sheikh, Omair Latif, DO  carbidopa-levodopa (SINEMET) 25-100 MG tablet Take 1.5 tablets by mouth in the morning and at bedtime. 06/11/19  Yes Patel, Donika K, DO  docusate sodium (COLACE) 100 MG capsule Take 200 mg by mouth as needed for mild constipation.   Yes [provider]  feeding supplement (ENSURE ENLIVE / ENSURE PLUS) LIQD Take 237 mLs by mouth daily. 10/20/20  Yes Sheikh, Omair Latif, DO  lisinopril (ZESTRIL) 10 MG tablet Take 1 tablet (10 mg total) by mouth daily. 10/24/20 01/22/21 Yes Lendon Colonel, NP  Misc Natural Products (PROSTATE) CAPS Take 1 tablet by mouth  2 (two) times daily. Prostate Plus otc supplement   Yes [provider]  Multiple Vitamins-Minerals (PRESERVISION AREDS 2+MULTI VIT PO) Take 1 capsule by mouth 2 (two) times daily.   Yes [provider]  senna (SENOKOT) 8.6 MG TABS tablet Take 1 tablet by mouth daily as needed for mild constipation.   Yes [provider]  timolol (TIMOPTIC) 0.5 % ophthalmic solution INSTILL ONE DROP IN [facility] EYE DAILY Patient taking differently: Place 1 drop into both eyes 2 (two) times daily. 09/08/20  Yes Rankin, Clent Demark, MD  vitamin C (ASCORBIC ACID) 500 MG tablet Take 500 mg by mouth daily.   Yes [provider]  XARELTO 20 MG TABS tablet TAKE ONE TABLET BY MOUTH DAILY  WITH SUPPER 01/06/21  Yes Minus Breeding, MD   Social History   Socioeconomic History   Marital status: Married    Spouse name: Mardene Celeste   Number of children: 0   Years of education: 18   Highest education level: Not on file  Occupational History   Occupation: Scientist, product/process development   Occupation: civil Chief Financial Officer  Tobacco Use   Smoking status: Former    Packs/day: 1.00    Years: 3.00    Pack years: 3.00    Types: Cigarettes    Quit date: 03/17/1955    Years since quitting: 65.8   Smokeless tobacco: Former  Scientific laboratory technician Use: Never used  Substance and Sexual Activity   Alcohol use: Yes    Alcohol/week: 14.0 standard drinks    Types: 7 Cans of beer, 7 Standard drinks or equivalent per week   Drug use: No   Sexual activity: Not Currently    Birth control/protection: None  Other Topics Concern   Not on file  Social History Narrative   Civil engineer, 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  Has no children.     Right handed    Social Determinants of Health   Financial Resource Strain: Not on file  Food Insecurity: Not on file  Transportation Needs: Not on file   Physical Activity: Not on file  Stress: Not on file  Social Connections: Not on file  Intimate Partner Violence: Not on file    Observations/Objective: There were no vitals filed for this visit. Nontoxic appearance on video, coherent speech, and questions were answered.  Additional history and questions provided by spouse.  No respiratory distress.  Speaking in complete sentences.  Assessment and Plan: Parkinson disease ([place]) - Plan: Ambulatory referral to Neurology  Generalized weakness - Plan: Ambulatory referral to Neurology  Macrocytic anemia - Plan: CBC  AKI (acute kidney injury) ([place]) - Plan: Comprehensive metabolic panel  Hypophosphatemia - Plan: Phosphorus  History of urinary infection  Generalized weakness appears to have been overall persistent, denies recent or acute changes.  Denies new urinary symptoms.  Suspect component of Parkinson's, deconditioning, but also prior AKI.  Additionally had history of macrocytic anemia, hypophosphatemia in hospital without follow-up testing known.   -Overdue for follow-up with neurology.  Referral placed back to his neurologist to discuss his generalized weakness, Parkinson's and treatment plan  -Check CMP to evaluate renal function, phosphate for prior hospital phosphatemia, and CBC with history of macrocytic anemia  -Continue PT, speech therapy, encouraged frequent small sips of fluids to lessen chance of dehydration  -In office exam in 2 weeks, ER/in person eval precautions given if any new symptoms prior to that time.  Follow Up Instructions: In office appointment in 2 weeks, ER precautions if any acute or new symptoms.   I discussed the assessment and treatment plan with the patient. The patient was provided an opportunity to ask questions and all were answered. The patient agreed with the plan and demonstrated an understanding of the instructions.   The patient was advised to call back or seek an in-person evaluation if the  symptoms worsen or if the condition fails to improve as anticipated.  I provided 38mnutes of non-face-to-face time during this encounter.   JWendie Agreste MD

## 2021-01-08 ENCOUNTER — Telehealth: Payer: Self-pay | Admitting: Family Medicine

## 2021-01-08 ENCOUNTER — Emergency Department (HOSPITAL_COMMUNITY): Payer: Medicare HMO

## 2021-01-08 ENCOUNTER — Other Ambulatory Visit: Payer: Self-pay

## 2021-01-08 DIAGNOSIS — I10 Essential (primary) hypertension: Secondary | ICD-10-CM | POA: Diagnosis not present

## 2021-01-08 DIAGNOSIS — I700 Atherosclerosis of aorta: Secondary | ICD-10-CM | POA: Diagnosis not present

## 2021-01-08 DIAGNOSIS — Z7401 Bed confinement status: Secondary | ICD-10-CM | POA: Diagnosis not present

## 2021-01-08 DIAGNOSIS — R001 Bradycardia, unspecified: Secondary | ICD-10-CM | POA: Diagnosis not present

## 2021-01-08 DIAGNOSIS — M47816 Spondylosis without myelopathy or radiculopathy, lumbar region: Secondary | ICD-10-CM | POA: Diagnosis not present

## 2021-01-08 DIAGNOSIS — S3991XA Unspecified injury of abdomen, initial encounter: Secondary | ICD-10-CM | POA: Diagnosis not present

## 2021-01-08 DIAGNOSIS — W19XXXA Unspecified fall, initial encounter: Secondary | ICD-10-CM | POA: Diagnosis not present

## 2021-01-08 LAB — CBC WITH DIFFERENTIAL/PLATELET
Abs Immature Granulocytes: 0.03 10*3/uL (ref 0.00–0.07)
Basophils Absolute: 0 10*3/uL (ref 0.0–0.1)
Basophils Relative: 1 %
Eosinophils Absolute: 0.2 10*3/uL (ref 0.0–0.5)
Eosinophils Relative: 2 %
HCT: 36.3 % — ABNORMAL LOW (ref 39.0–52.0)
Hemoglobin: 11.8 g/dL — ABNORMAL LOW (ref 13.0–17.0)
Immature Granulocytes: 0 %
Lymphocytes Relative: 21 %
Lymphs Abs: 1.5 10*3/uL (ref 0.7–4.0)
MCH: 32.2 pg (ref 26.0–34.0)
MCHC: 32.5 g/dL (ref 30.0–36.0)
MCV: 99.2 fL (ref 80.0–100.0)
Monocytes Absolute: 0.9 10*3/uL (ref 0.1–1.0)
Monocytes Relative: 13 %
Neutro Abs: 4.4 10*3/uL (ref 1.7–7.7)
Neutrophils Relative %: 63 %
Platelets: 249 10*3/uL (ref 150–400)
RBC: 3.66 MIL/uL — ABNORMAL LOW (ref 4.22–5.81)
RDW: 14.6 % (ref 11.5–15.5)
WBC: 7 10*3/uL (ref 4.0–10.5)
nRBC: 0 % (ref 0.0–0.2)

## 2021-01-08 LAB — COMPREHENSIVE METABOLIC PANEL
ALT: 16 U/L (ref 0–44)
AST: 21 U/L (ref 15–41)
Albumin: 3.4 g/dL — ABNORMAL LOW (ref 3.5–5.0)
Alkaline Phosphatase: 82 U/L (ref 38–126)
Anion gap: 6 (ref 5–15)
BUN: 36 mg/dL — ABNORMAL HIGH (ref 8–23)
CO2: 24 mmol/L (ref 22–32)
Calcium: 8.9 mg/dL (ref 8.9–10.3)
Chloride: 107 mmol/L (ref 98–111)
Creatinine, Ser: 1.13 mg/dL (ref 0.61–1.24)
GFR, Estimated: >60 mL/min (ref >60)
Glucose, Bld: 112 mg/dL — ABNORMAL HIGH (ref 70–99)
Potassium: 4.4 mmol/L (ref 3.5–5.1)
Sodium: 137 mmol/L (ref 135–145)
Total Bilirubin: 0.6 mg/dL (ref 0.3–1.2)
Total Protein: 6.7 g/dL (ref 6.5–8.1)

## 2021-01-08 MED ORDER — HYDROCODONE-ACETAMINOPHEN 5-325 MG PO TABS: 1.0000 | ORAL_TABLET | Freq: Four times a day (QID) | ORAL | 0 refills | Status: DC | PRN

## 2021-01-08 MED ORDER — IOHEXOL 350 MG/ML SOLN
80.0000 mL | Freq: Once | INTRAVENOUS | Status: AC | PRN
  Administered 2021-01-08: 02:00:00 80 mL via INTRAVENOUS

## 2021-01-08 MED ORDER — SODIUM CHLORIDE 0.9 % IV BOLUS
500.0000 mL | Freq: Once | INTRAVENOUS | Status: AC
  Administered 2021-01-08: 500 mL via INTRAVENOUS

## 2021-01-08 MED ORDER — HYDROCODONE-ACETAMINOPHEN 5-325 MG PO TABS
1.0000 | ORAL_TABLET | Freq: Once | ORAL | Status: AC
  Administered 2021-01-08: 04:00:00 1 via ORAL
  Filled 2021-01-08: qty 1

## 2021-01-08 NOTE — Telephone Encounter (Signed)
CBC, CMP noted. Still needs phosphorus - can be drawn at upcoming visit with other provider if needed or at [facility] as planned.

## 2021-01-08 NOTE — ED Notes (Signed)
To CT

## 2021-01-08 NOTE — ED Provider Notes (Signed)
[facility] DEPT Provider Note   CSN: [id number] Arrival date & time: 01/07/21  2331     History Chief Complaint  Patient presents with   Back Pain    Chad Russell is a 87 y.o. male.  Patient is a 87 year old male with past medical history of atrial fibrillation on Xarelto, Parkinson's disease, coronary artery disease.  Patient presenting today for evaluation of fall/right-sided abdominal pain.  Patient fell 2 days ago from a standing height.  He has been having some discomfort to the right lateral lower ribs and right upper quadrant.  This pain worsened this evening after he sneezed multiple times.  He describes pain that is worse when he breathes or moves.  He denies any alleviating factors.  Wife called 18 and patient was transported here.  The history is provided by the patient.      Past Medical History:  Diagnosis Date   Arthritis    Atrial fibrillation [facility])    Balance problem 10/31/2013   BPH (benign prostatic hyperplasia) 2014   Urology Dr [name] with [facility] [phone]    Bradycardia    a. baseline HR in the 30's. Asymptomatic - no history of PPM placement.    CAD (coronary artery disease)    LHC 03/16/11: Mid LAD 10-20%, proximal circumflex 10%, mid RCA 20%, EF 55%.   Cataract    Complete heart block (HCC)    Fracture of femoral neck, right ([place]) 04/19/2017   GERD (gastroesophageal reflux disease)    takes occasional  zantac   Hx of echocardiogram    Echo 02/2011: EF 65-70%, normal wall motion, MAC, mild MR, mild LAE, mild RVE   Hypertension    OSA (obstructive sleep apnea)    per patient went to have sleep study done about 2 years ago, bu they never F/U with him about results; but wife reports he still has periods of apnea at night    Talar fracture    casted no surg   UTI (urinary tract infection) 10/2020    Patient Active Problem List   Diagnosis Date Noted   UTI (urinary tract infection) 10/14/2020   Bradycardia  10/14/2020   Educated about COVID-19 virus infection 11/29/2019   Exudative age-related macular degeneration of left eye with active choroidal neovascularization ([place]) 10/02/2019   Central retinal vein occlusion with macular edema of right eye 06/20/2019   Central retinal vein occlusion with neovascularization of right eye 06/20/2019   Ocular ischemic syndrome 06/20/2019   Retinal hemorrhage of left eye 06/20/2019   Retinal hemorrhage of right eye 06/20/2019   AKI (acute kidney injury) ([place]) 12/15/2018   Hematoma of scalp    Heart block    Chest pain 05/04/2018   Fracture of femoral neck, left ([place]) 04/19/2017   MGUS (monoclonal gammopathy of unknown significance) 01/15/2017   BPH with obstruction/lower urinary tract symptoms 09/07/2016   Preoperative cardiovascular examination 08/24/2016   Urinary retention 05/13/2016   Balance problem 10/31/2013   OSA (obstructive sleep apnea) 10/31/2013   BPH (benign prostatic hyperplasia) 08/28/2012   Atrial fibrillation ([place]) 06/08/2012   Hyperplasia of prostate with lower urinary tract symptoms (LUTS) 04/28/2012   Moderate malnutrition ([place]) 04/28/2012   Infection of urinary tract - recurrent 04/26/2012   NSTEMI (non-ST elevated myocardial infarction) ([place]) 03/15/2011   Complete heart block ([place]) 03/15/2011   HTN (hypertension) 03/15/2011    Past Surgical History:  Procedure Laterality Date   ABDOMINAL AORTOGRAM W/LOWER EXTREMITY N/A 01/17/2017   Procedure: ABDOMINAL  AORTOGRAM W/LOWER EXTREMITY;  Surgeon: Angelia Mould, MD;  Location: [facility];  Service: Cardiovascular;  Laterality: N/A;   COLONOSCOPY     Repeated and normal in 2011   FRACTURE SURGERY     HEMIARTHROPLASTY HIP Right 04/19/2017   HEMORROIDECTOMY     HIP ARTHROPLASTY Right 04/19/2017   Procedure: ARTHROPLASTY BIPOLAR HIP (HEMIARTHROPLASTY);  Surgeon: Marchia Bond, MD;  Location: [facility];  Service: Orthopedics;  Laterality: Right;   HIP ARTHROPLASTY Left  12/16/2018   Procedure: ARTHROPLASTY BIPOLAR HIP (HEMIARTHROPLASTY);  Surgeon: Altamese Glenside, MD;  Location: [facility];  Service: Orthopedics;  Laterality: Left;   INGUINAL HERNIA REPAIR Right 02/26/2014   Procedure: LAPAROSCOPIC RIGHT INGUINAL HERNIA REPAIR;  Surgeon: Ralene Ok, MD;  Location: [facility];  Service: General;  Laterality: Right;   INGUINAL HERNIA REPAIR     INSERTION OF MESH Right 02/26/2014   Procedure: INSERTION OF MESH;  Surgeon: Ralene Ok, MD;  Location: [facility];  Service: General;  Laterality: Right;   LEFT HEART CATHETERIZATION WITH CORONARY ANGIOGRAM N/A 03/16/2011   Procedure: LEFT HEART CATHETERIZATION WITH CORONARY ANGIOGRAM;  Surgeon: Peter M Martinique, MD;  Location: [facility] CATH LAB;  Service: Cardiovascular;  Laterality: N/A;   PROSTATE BIOPSY     negative - [facility]   TRANSURETHRAL RESECTION OF PROSTATE N/A 09/07/2016   Procedure: TRANSURETHRAL RESECTION OF THE PROSTATE (TURP);  Surgeon: Irine Seal, MD;  Location: [facility];  Service: Urology;  Laterality: N/A;       Family History  Problem Relation Age of Onset   Hypertension Mother    Multiple sclerosis Sister     Social History   Tobacco Use   Smoking status: Former    Packs/day: 1.00    Years: 3.00    Pack years: 3.00    Types: Cigarettes    Quit date: 03/17/1955    Years since quitting: 65.8   Smokeless tobacco: Former  Scientific laboratory technician Use: Never used  Substance Use Topics   Alcohol use: Yes    Alcohol/week: 14.0 standard drinks    Types: 7 Cans of beer, 7 Standard drinks or equivalent per week   Drug use: No    Home Medications Prior to Admission medications   Medication Sig Start Date End Date Taking? Authorizing Provider  acetaminophen (TYLENOL) 500 MG tablet Take 1 tablet (500 mg total) by mouth every 8 (eight) hours. Patient taking differently: Take 500 mg by mouth every 8 (eight) hours as needed for moderate pain. 12/26/18   Debbe Odea, MD  calcium carbonate (TUMS - DOSED IN MG  ELEMENTAL CALCIUM) 500 MG chewable tablet Chew 1 tablet (200 mg of elemental calcium total) by mouth 2 (two) times daily with a meal. Patient taking differently: Chew 1 tablet by mouth as needed. 10/20/20   Raiford Noble Latif, DO  carbidopa-levodopa (SINEMET) 25-100 MG tablet Take 1.5 tablets by mouth in the morning and at bedtime. 06/11/19   Narda Amber K, DO  docusate sodium (COLACE) 100 MG capsule Take 200 mg by mouth as needed for mild constipation.    [provider]  feeding supplement (ENSURE ENLIVE / ENSURE PLUS) LIQD Take 237 mLs by mouth daily. 10/20/20   Raiford Noble Latif, DO  lisinopril (ZESTRIL) 10 MG tablet Take 1 tablet (10 mg total) by mouth daily. 10/24/20 01/22/21  Lendon Colonel, NP  Misc Natural Products (PROSTATE) CAPS Take 1 tablet by mouth 2 (two) times daily. Prostate Plus otc supplement    [provider]  Multiple Vitamins-Minerals (PRESERVISION AREDS 2+MULTI VIT PO) Take 1 capsule by mouth 2 (two) times daily.    [provider]  senna (SENOKOT) 8.6 MG TABS tablet Take 1 tablet by mouth daily as needed for mild constipation.    [provider]  timolol (TIMOPTIC) 0.5 % ophthalmic solution INSTILL ONE DROP IN [facility] EYE DAILY Patient taking differently: Place 1 drop into both eyes 2 (two) times daily. 09/08/20   Rankin, Clent Demark, MD  vitamin C (ASCORBIC ACID) 500 MG tablet Take 500 mg by mouth daily.    [provider]  XARELTO 20 MG TABS tablet TAKE ONE TABLET BY MOUTH DAILY WITH SUPPER 01/06/21   Minus Breeding, MD    Allergies    Doxycycline and Keflex [cephalexin]  Review of Systems   Review of Systems  All other systems reviewed and are negative.  Physical Exam Updated Vital Signs BP (!) 157/65 (BP Location: Right Arm)   Pulse (!) 57   Temp 97.6 F (36.4 C) (Oral)   Resp 16   Ht 6' (1.829 m)   Wt 77.1 kg   SpO2 97%   BMI 23.06 kg/m   Physical Exam Vitals and nursing note reviewed.  Constitutional:       General: He is not in acute distress.    Appearance: He is well-developed. He is not diaphoretic.  HENT:     Head: Normocephalic and atraumatic.  Cardiovascular:     Rate and Rhythm: Normal rate and regular rhythm.     Heart sounds: No murmur heard.   No friction rub.  Pulmonary:     Effort: Pulmonary effort is normal. No respiratory distress.     Breath sounds: Normal breath sounds. No wheezing or rales.     Comments: There is tenderness to palpation to the right lower lateral ribs.  There is no palpable abnormality or crepitus.  Breath sounds are equal bilaterally. Abdominal:     General: Bowel sounds are normal. There is no distension.     Palpations: Abdomen is soft.     Tenderness: There is no abdominal tenderness.     Comments: There is tenderness to palpation of the right upper quadrant and right lateral abdomen.  There is no rebound or guarding.  Musculoskeletal:        General: Normal range of motion.     Cervical back: Normal range of motion and neck supple.  Skin:    General: Skin is warm and dry.  Neurological:     Mental Status: He is alert and oriented to person, place, and time.     Coordination: Coordination normal.    ED Results / Procedures / Treatments   Labs (all labs ordered are listed, but only abnormal results are displayed) Labs Reviewed  COMPREHENSIVE METABOLIC PANEL  CBC WITH DIFFERENTIAL/PLATELET    EKG None  Radiology No results found.  Procedures Procedures   Medications Ordered in ED Medications  sodium chloride 0.9 % bolus 500 mL (has no administration in time range)    ED Course  I have reviewed the triage vital signs and the nursing notes.  Pertinent labs & imaging results that were available during my care of the patient were reviewed by me and considered in my medical decision making (see chart for details).    MDM Rules/Calculators/A&P  Patient with extensive medical history as per HPI currently taking Xarelto for A. fib.   He presents after fall and right-sided abdominal/lower chest pain.  CT of  the abdomen and pelvis was obtained to rule out internal bleeding and was negative.  There is no evidence for obvious rib fracture or pneumothorax.  There is also no evidence for retroperitoneal hematoma.  Patient's pain treated and laboratory studies otherwise unremarkable.  Patient seems appropriate for discharge.  Wife states that he is normally unsteady secondary to his Parkinson's, but seems comfortable with him returning home so long as she has assistance getting him into the house.  Patient will be transported back by [facility].  To return as needed for any problems.  Final Clinical Impression(s) / ED Diagnoses Final diagnoses:  None    Rx / DC Orders ED Discharge Orders     None        Veryl Speak, MD 01/08/21 0410

## 2021-01-08 NOTE — Discharge Instructions (Addendum)
Begin taking hydrocodone as prescribed as needed for pain.  Continue other medications as previously prescribed.  Follow-up with primary doctor in the next week if not improving, and return to the ER if you develop worsening pain, difficulty breathing, or other new and concerning symptoms.

## 2021-01-08 NOTE — ED Notes (Signed)
[facility] callled.

## 2021-01-08 NOTE — Telephone Encounter (Signed)
Hospital drew labs for cbc and cmp but did not draw phosphorus. Is this something we need to try to bring him in for or can this wait until the next visit?

## 2021-01-08 NOTE — Telephone Encounter (Signed)
Pt's wife calling in stating that pt was seen in the ER last night for side pain. She states that they did bw and wanted to know if want the did at the ER cover what Dr. Carlota Raspberry wanted him to have.  Please advise

## 2021-01-09 DIAGNOSIS — N401 Enlarged prostate with lower urinary tract symptoms: Secondary | ICD-10-CM | POA: Diagnosis not present

## 2021-01-09 DIAGNOSIS — N39498 Other specified urinary incontinence: Secondary | ICD-10-CM | POA: Diagnosis not present

## 2021-01-09 DIAGNOSIS — I739 Peripheral vascular disease, unspecified: Secondary | ICD-10-CM | POA: Diagnosis not present

## 2021-01-09 DIAGNOSIS — G20 Parkinson's disease: Secondary | ICD-10-CM | POA: Diagnosis not present

## 2021-01-09 DIAGNOSIS — M79672 Pain in left foot: Secondary | ICD-10-CM | POA: Diagnosis not present

## 2021-01-09 DIAGNOSIS — M79671 Pain in right foot: Secondary | ICD-10-CM | POA: Diagnosis not present

## 2021-01-09 DIAGNOSIS — I480 Paroxysmal atrial fibrillation: Secondary | ICD-10-CM | POA: Diagnosis not present

## 2021-01-09 DIAGNOSIS — I459 Conduction disorder, unspecified: Secondary | ICD-10-CM | POA: Diagnosis not present

## 2021-01-09 DIAGNOSIS — H353221 Exudative age-related macular degeneration, left eye, with active choroidal neovascularization: Secondary | ICD-10-CM | POA: Diagnosis not present

## 2021-01-09 NOTE — Telephone Encounter (Signed)
Pt wife made aware she states she will see if Cards can do this at upcoming appt.

## 2021-01-10 DIAGNOSIS — I480 Paroxysmal atrial fibrillation: Secondary | ICD-10-CM | POA: Diagnosis not present

## 2021-01-10 DIAGNOSIS — N401 Enlarged prostate with lower urinary tract symptoms: Secondary | ICD-10-CM | POA: Diagnosis not present

## 2021-01-10 DIAGNOSIS — I739 Peripheral vascular disease, unspecified: Secondary | ICD-10-CM | POA: Diagnosis not present

## 2021-01-10 DIAGNOSIS — G20 Parkinson's disease: Secondary | ICD-10-CM | POA: Diagnosis not present

## 2021-01-10 DIAGNOSIS — M79671 Pain in right foot: Secondary | ICD-10-CM | POA: Diagnosis not present

## 2021-01-10 DIAGNOSIS — N39498 Other specified urinary incontinence: Secondary | ICD-10-CM | POA: Diagnosis not present

## 2021-01-10 DIAGNOSIS — I459 Conduction disorder, unspecified: Secondary | ICD-10-CM | POA: Diagnosis not present

## 2021-01-10 DIAGNOSIS — H353221 Exudative age-related macular degeneration, left eye, with active choroidal neovascularization: Secondary | ICD-10-CM | POA: Diagnosis not present

## 2021-01-10 DIAGNOSIS — M79672 Pain in left foot: Secondary | ICD-10-CM | POA: Diagnosis not present

## 2021-01-12 ENCOUNTER — Telehealth: Payer: Self-pay | Admitting: Family Medicine

## 2021-01-12 DIAGNOSIS — N401 Enlarged prostate with lower urinary tract symptoms: Secondary | ICD-10-CM | POA: Diagnosis not present

## 2021-01-12 DIAGNOSIS — N39498 Other specified urinary incontinence: Secondary | ICD-10-CM | POA: Diagnosis not present

## 2021-01-12 DIAGNOSIS — G20 Parkinson's disease: Secondary | ICD-10-CM | POA: Diagnosis not present

## 2021-01-12 DIAGNOSIS — M79671 Pain in right foot: Secondary | ICD-10-CM | POA: Diagnosis not present

## 2021-01-12 DIAGNOSIS — H353221 Exudative age-related macular degeneration, left eye, with active choroidal neovascularization: Secondary | ICD-10-CM | POA: Diagnosis not present

## 2021-01-12 DIAGNOSIS — I459 Conduction disorder, unspecified: Secondary | ICD-10-CM | POA: Diagnosis not present

## 2021-01-12 DIAGNOSIS — M79672 Pain in left foot: Secondary | ICD-10-CM | POA: Diagnosis not present

## 2021-01-12 DIAGNOSIS — I480 Paroxysmal atrial fibrillation: Secondary | ICD-10-CM | POA: Diagnosis not present

## 2021-01-12 DIAGNOSIS — I739 Peripheral vascular disease, unspecified: Secondary | ICD-10-CM | POA: Diagnosis not present

## 2021-01-12 NOTE — Telephone Encounter (Signed)
..  Who is calling:  Costella Hatcher [facility]) - therapist  Where are they calling from:[facility] number:  [phone]  What type of DME ([place]) would you like your provider to order:  A transport or travel wheel chair  Who and where does the order need to be sent:  [facility] - Fax # [phone]  Comments:Patient is extremely weak, has not had a bowel movement in the last 5 days, was covered in [place] and wife unable to help him because she is extremely weak also    Last visit with PCP (>3 months requires an appointment for insurance to cover the cost.  Please schedule patient for visit to discuss medical necessity for DME)

## 2021-01-12 NOTE — Telephone Encounter (Signed)
Last saw you on 01/07/2021 as a video visit. Please advise

## 2021-01-13 ENCOUNTER — Other Ambulatory Visit: Payer: Self-pay | Admitting: Registered Nurse

## 2021-01-13 DIAGNOSIS — N401 Enlarged prostate with lower urinary tract symptoms: Secondary | ICD-10-CM | POA: Diagnosis not present

## 2021-01-13 DIAGNOSIS — R531 Weakness: Secondary | ICD-10-CM

## 2021-01-13 DIAGNOSIS — M79671 Pain in right foot: Secondary | ICD-10-CM | POA: Diagnosis not present

## 2021-01-13 DIAGNOSIS — G20 Parkinson's disease: Secondary | ICD-10-CM

## 2021-01-13 DIAGNOSIS — I739 Peripheral vascular disease, unspecified: Secondary | ICD-10-CM | POA: Diagnosis not present

## 2021-01-13 DIAGNOSIS — H353221 Exudative age-related macular degeneration, left eye, with active choroidal neovascularization: Secondary | ICD-10-CM | POA: Diagnosis not present

## 2021-01-13 DIAGNOSIS — I459 Conduction disorder, unspecified: Secondary | ICD-10-CM | POA: Diagnosis not present

## 2021-01-13 DIAGNOSIS — I480 Paroxysmal atrial fibrillation: Secondary | ICD-10-CM | POA: Diagnosis not present

## 2021-01-13 DIAGNOSIS — M79672 Pain in left foot: Secondary | ICD-10-CM | POA: Diagnosis not present

## 2021-01-13 DIAGNOSIS — N39498 Other specified urinary incontinence: Secondary | ICD-10-CM | POA: Diagnosis not present

## 2021-01-13 NOTE — Telephone Encounter (Signed)
I am sorry to hear of their predicament.  When I last spoke to him on the 16th he was having some fatigue, but it sounds like that may have worsened since his fall after my visit.  No sign of fracture on imaging in ER.  Spoke with therapist Clair Gulling - Chad Russell is generally weak, more bedbound since fall last week.Wife has also been weak - some difficulty moving Chad Russell. Lightweight transport wheelchair may help - will send in rx today.  Clair Gulling is OT once per week, PT 1-2xper week. Has [facility] nurse- nurse will visit him today, Clair Gulling will contact her and advised to let us know if concerns on her visit. Clair Gulling will reach out to social worker this week as well for eval to determine other options or assistance if needed. May need private assistant at home.   Please call and check status. If he has had progression of fatigue/weakness, or new incontinence, fevers, dysuria - should be seen through the ER.  We should be getting an update form nurse visit today as well.

## 2021-01-13 NOTE — Telephone Encounter (Signed)
Called to check in with patient, no answer LM

## 2021-01-16 DIAGNOSIS — N39498 Other specified urinary incontinence: Secondary | ICD-10-CM | POA: Diagnosis not present

## 2021-01-16 DIAGNOSIS — M79672 Pain in left foot: Secondary | ICD-10-CM | POA: Diagnosis not present

## 2021-01-16 DIAGNOSIS — H353221 Exudative age-related macular degeneration, left eye, with active choroidal neovascularization: Secondary | ICD-10-CM | POA: Diagnosis not present

## 2021-01-16 DIAGNOSIS — M79671 Pain in right foot: Secondary | ICD-10-CM | POA: Diagnosis not present

## 2021-01-16 DIAGNOSIS — I739 Peripheral vascular disease, unspecified: Secondary | ICD-10-CM | POA: Diagnosis not present

## 2021-01-16 DIAGNOSIS — I459 Conduction disorder, unspecified: Secondary | ICD-10-CM | POA: Diagnosis not present

## 2021-01-16 DIAGNOSIS — I480 Paroxysmal atrial fibrillation: Secondary | ICD-10-CM | POA: Diagnosis not present

## 2021-01-16 DIAGNOSIS — N401 Enlarged prostate with lower urinary tract symptoms: Secondary | ICD-10-CM | POA: Diagnosis not present

## 2021-01-16 DIAGNOSIS — G20 Parkinson's disease: Secondary | ICD-10-CM | POA: Diagnosis not present

## 2021-01-19 DIAGNOSIS — N401 Enlarged prostate with lower urinary tract symptoms: Secondary | ICD-10-CM | POA: Diagnosis not present

## 2021-01-19 DIAGNOSIS — G20 Parkinson's disease: Secondary | ICD-10-CM | POA: Diagnosis not present

## 2021-01-19 DIAGNOSIS — I739 Peripheral vascular disease, unspecified: Secondary | ICD-10-CM | POA: Diagnosis not present

## 2021-01-19 DIAGNOSIS — I480 Paroxysmal atrial fibrillation: Secondary | ICD-10-CM | POA: Diagnosis not present

## 2021-01-19 DIAGNOSIS — M79672 Pain in left foot: Secondary | ICD-10-CM | POA: Diagnosis not present

## 2021-01-19 DIAGNOSIS — N39498 Other specified urinary incontinence: Secondary | ICD-10-CM | POA: Diagnosis not present

## 2021-01-19 DIAGNOSIS — M79671 Pain in right foot: Secondary | ICD-10-CM | POA: Diagnosis not present

## 2021-01-19 DIAGNOSIS — H353221 Exudative age-related macular degeneration, left eye, with active choroidal neovascularization: Secondary | ICD-10-CM | POA: Diagnosis not present

## 2021-01-19 DIAGNOSIS — I459 Conduction disorder, unspecified: Secondary | ICD-10-CM | POA: Diagnosis not present

## 2021-01-20 ENCOUNTER — Telehealth: Payer: Self-pay | Admitting: Family Medicine

## 2021-01-20 DIAGNOSIS — G20 Parkinson's disease: Secondary | ICD-10-CM | POA: Diagnosis not present

## 2021-01-20 DIAGNOSIS — N401 Enlarged prostate with lower urinary tract symptoms: Secondary | ICD-10-CM | POA: Diagnosis not present

## 2021-01-20 DIAGNOSIS — H353221 Exudative age-related macular degeneration, left eye, with active choroidal neovascularization: Secondary | ICD-10-CM | POA: Diagnosis not present

## 2021-01-20 DIAGNOSIS — M79672 Pain in left foot: Secondary | ICD-10-CM | POA: Diagnosis not present

## 2021-01-20 DIAGNOSIS — I739 Peripheral vascular disease, unspecified: Secondary | ICD-10-CM | POA: Diagnosis not present

## 2021-01-20 DIAGNOSIS — I480 Paroxysmal atrial fibrillation: Secondary | ICD-10-CM | POA: Diagnosis not present

## 2021-01-20 DIAGNOSIS — M79671 Pain in right foot: Secondary | ICD-10-CM | POA: Diagnosis not present

## 2021-01-20 DIAGNOSIS — I459 Conduction disorder, unspecified: Secondary | ICD-10-CM | POA: Diagnosis not present

## 2021-01-20 DIAGNOSIS — N39498 Other specified urinary incontinence: Secondary | ICD-10-CM | POA: Diagnosis not present

## 2021-01-20 NOTE — Telephone Encounter (Signed)
[facility] needs to know if Chad Russell needs a "lightweight" wheelchair or a "transport" wheel chair - Please specify

## 2021-01-21 NOTE — Telephone Encounter (Signed)
Transport wheelchair please  Thanks  [employer]

## 2021-01-21 NOTE — Telephone Encounter (Signed)
Patient needs an new order for the transport wheelchair .

## 2021-01-21 NOTE — Telephone Encounter (Signed)
Called [facility] and told them that Chad Russell is ordering a transport wheel chair

## 2021-01-21 NOTE — Telephone Encounter (Signed)
[facility] needs to know if the patient is suppose to have an light weight wheelchair or transport wheelchair I think both were listed on the order they were just trying to specify.

## 2021-01-22 ENCOUNTER — Other Ambulatory Visit: Payer: Self-pay | Admitting: Registered Nurse

## 2021-01-22 DIAGNOSIS — R531 Weakness: Secondary | ICD-10-CM

## 2021-01-22 DIAGNOSIS — G20 Parkinson's disease: Secondary | ICD-10-CM

## 2021-01-23 DIAGNOSIS — M79672 Pain in left foot: Secondary | ICD-10-CM | POA: Diagnosis not present

## 2021-01-23 DIAGNOSIS — I459 Conduction disorder, unspecified: Secondary | ICD-10-CM | POA: Diagnosis not present

## 2021-01-23 DIAGNOSIS — I480 Paroxysmal atrial fibrillation: Secondary | ICD-10-CM | POA: Diagnosis not present

## 2021-01-23 DIAGNOSIS — M79671 Pain in right foot: Secondary | ICD-10-CM | POA: Diagnosis not present

## 2021-01-23 DIAGNOSIS — N39498 Other specified urinary incontinence: Secondary | ICD-10-CM | POA: Diagnosis not present

## 2021-01-23 DIAGNOSIS — H353221 Exudative age-related macular degeneration, left eye, with active choroidal neovascularization: Secondary | ICD-10-CM | POA: Diagnosis not present

## 2021-01-23 DIAGNOSIS — I739 Peripheral vascular disease, unspecified: Secondary | ICD-10-CM | POA: Diagnosis not present

## 2021-01-23 DIAGNOSIS — G20 Parkinson's disease: Secondary | ICD-10-CM | POA: Diagnosis not present

## 2021-01-23 DIAGNOSIS — N401 Enlarged prostate with lower urinary tract symptoms: Secondary | ICD-10-CM | POA: Diagnosis not present

## 2021-01-26 DIAGNOSIS — M79672 Pain in left foot: Secondary | ICD-10-CM | POA: Diagnosis not present

## 2021-01-26 DIAGNOSIS — I739 Peripheral vascular disease, unspecified: Secondary | ICD-10-CM | POA: Diagnosis not present

## 2021-01-26 DIAGNOSIS — H353221 Exudative age-related macular degeneration, left eye, with active choroidal neovascularization: Secondary | ICD-10-CM | POA: Diagnosis not present

## 2021-01-26 DIAGNOSIS — M79671 Pain in right foot: Secondary | ICD-10-CM | POA: Diagnosis not present

## 2021-01-26 DIAGNOSIS — I459 Conduction disorder, unspecified: Secondary | ICD-10-CM | POA: Diagnosis not present

## 2021-01-26 DIAGNOSIS — N39498 Other specified urinary incontinence: Secondary | ICD-10-CM | POA: Diagnosis not present

## 2021-01-26 DIAGNOSIS — G20 Parkinson's disease: Secondary | ICD-10-CM | POA: Diagnosis not present

## 2021-01-26 DIAGNOSIS — I480 Paroxysmal atrial fibrillation: Secondary | ICD-10-CM | POA: Diagnosis not present

## 2021-01-26 DIAGNOSIS — N401 Enlarged prostate with lower urinary tract symptoms: Secondary | ICD-10-CM | POA: Diagnosis not present

## 2021-01-27 DIAGNOSIS — H353221 Exudative age-related macular degeneration, left eye, with active choroidal neovascularization: Secondary | ICD-10-CM | POA: Diagnosis not present

## 2021-01-27 DIAGNOSIS — M79672 Pain in left foot: Secondary | ICD-10-CM | POA: Diagnosis not present

## 2021-01-27 DIAGNOSIS — I739 Peripheral vascular disease, unspecified: Secondary | ICD-10-CM | POA: Diagnosis not present

## 2021-01-27 DIAGNOSIS — N401 Enlarged prostate with lower urinary tract symptoms: Secondary | ICD-10-CM | POA: Diagnosis not present

## 2021-01-27 DIAGNOSIS — I459 Conduction disorder, unspecified: Secondary | ICD-10-CM | POA: Diagnosis not present

## 2021-01-27 DIAGNOSIS — N39498 Other specified urinary incontinence: Secondary | ICD-10-CM | POA: Diagnosis not present

## 2021-01-27 DIAGNOSIS — M79671 Pain in right foot: Secondary | ICD-10-CM | POA: Diagnosis not present

## 2021-01-27 DIAGNOSIS — G20 Parkinson's disease: Secondary | ICD-10-CM | POA: Diagnosis not present

## 2021-01-27 DIAGNOSIS — I480 Paroxysmal atrial fibrillation: Secondary | ICD-10-CM | POA: Diagnosis not present

## 2021-01-29 DIAGNOSIS — G20 Parkinson's disease: Secondary | ICD-10-CM | POA: Diagnosis not present

## 2021-01-29 DIAGNOSIS — N401 Enlarged prostate with lower urinary tract symptoms: Secondary | ICD-10-CM | POA: Diagnosis not present

## 2021-01-29 DIAGNOSIS — H353221 Exudative age-related macular degeneration, left eye, with active choroidal neovascularization: Secondary | ICD-10-CM | POA: Diagnosis not present

## 2021-01-29 DIAGNOSIS — M79671 Pain in right foot: Secondary | ICD-10-CM | POA: Diagnosis not present

## 2021-01-29 DIAGNOSIS — I480 Paroxysmal atrial fibrillation: Secondary | ICD-10-CM | POA: Diagnosis not present

## 2021-01-29 DIAGNOSIS — I459 Conduction disorder, unspecified: Secondary | ICD-10-CM | POA: Diagnosis not present

## 2021-01-29 DIAGNOSIS — M79672 Pain in left foot: Secondary | ICD-10-CM | POA: Diagnosis not present

## 2021-01-29 DIAGNOSIS — N39498 Other specified urinary incontinence: Secondary | ICD-10-CM | POA: Diagnosis not present

## 2021-01-29 DIAGNOSIS — I739 Peripheral vascular disease, unspecified: Secondary | ICD-10-CM | POA: Diagnosis not present

## 2021-01-30 ENCOUNTER — Ambulatory Visit: Payer: Medicare HMO | Admitting: Neurology

## 2021-01-30 DIAGNOSIS — G20 Parkinson's disease: Secondary | ICD-10-CM | POA: Diagnosis not present

## 2021-01-30 DIAGNOSIS — I459 Conduction disorder, unspecified: Secondary | ICD-10-CM | POA: Diagnosis not present

## 2021-01-30 DIAGNOSIS — N401 Enlarged prostate with lower urinary tract symptoms: Secondary | ICD-10-CM | POA: Diagnosis not present

## 2021-01-30 DIAGNOSIS — I480 Paroxysmal atrial fibrillation: Secondary | ICD-10-CM | POA: Diagnosis not present

## 2021-01-30 DIAGNOSIS — M79672 Pain in left foot: Secondary | ICD-10-CM | POA: Diagnosis not present

## 2021-01-30 DIAGNOSIS — H353221 Exudative age-related macular degeneration, left eye, with active choroidal neovascularization: Secondary | ICD-10-CM | POA: Diagnosis not present

## 2021-01-30 DIAGNOSIS — N39498 Other specified urinary incontinence: Secondary | ICD-10-CM | POA: Diagnosis not present

## 2021-01-30 DIAGNOSIS — M79671 Pain in right foot: Secondary | ICD-10-CM | POA: Diagnosis not present

## 2021-01-30 DIAGNOSIS — I739 Peripheral vascular disease, unspecified: Secondary | ICD-10-CM | POA: Diagnosis not present

## 2021-02-02 ENCOUNTER — Telehealth: Payer: Self-pay | Admitting: Family Medicine

## 2021-02-02 DIAGNOSIS — N401 Enlarged prostate with lower urinary tract symptoms: Secondary | ICD-10-CM | POA: Diagnosis not present

## 2021-02-02 DIAGNOSIS — N39498 Other specified urinary incontinence: Secondary | ICD-10-CM | POA: Diagnosis not present

## 2021-02-02 DIAGNOSIS — G20 Parkinson's disease: Secondary | ICD-10-CM | POA: Diagnosis not present

## 2021-02-02 DIAGNOSIS — I480 Paroxysmal atrial fibrillation: Secondary | ICD-10-CM | POA: Diagnosis not present

## 2021-02-02 DIAGNOSIS — H353221 Exudative age-related macular degeneration, left eye, with active choroidal neovascularization: Secondary | ICD-10-CM | POA: Diagnosis not present

## 2021-02-02 DIAGNOSIS — I459 Conduction disorder, unspecified: Secondary | ICD-10-CM | POA: Diagnosis not present

## 2021-02-02 DIAGNOSIS — I739 Peripheral vascular disease, unspecified: Secondary | ICD-10-CM | POA: Diagnosis not present

## 2021-02-02 DIAGNOSIS — M79671 Pain in right foot: Secondary | ICD-10-CM | POA: Diagnosis not present

## 2021-02-02 DIAGNOSIS — M79672 Pain in left foot: Secondary | ICD-10-CM | POA: Diagnosis not present

## 2021-02-02 NOTE — Telephone Encounter (Signed)
Placed in providers to be signed folder at nurses station

## 2021-02-02 NOTE — Telephone Encounter (Signed)
..  Type of form received:Client Coordination Note Report  Additional comments:   Received by:  Chad Russell should be Faxed/mailed to: (address/ fax #)  [phone]  Is patient requesting call for pickup:no  Form placed:  In Dr. Vonna Kotyk bin up front  Attach charge sheet.  Provider will determine charge.  Individual made aware of 3-5 business day turn around No?

## 2021-02-03 ENCOUNTER — Telehealth: Payer: Self-pay | Admitting: Family Medicine

## 2021-02-03 NOTE — Telephone Encounter (Signed)
FYI Dr Carlota Raspberry

## 2021-02-03 NOTE — Telephone Encounter (Signed)
Staff is still concerned about this patient and his wife's ability to care for him.  Wife is unable to take care of patient.  Patient's wife refused offer of hospice assistance or replacement therapy. [place] called Protective Services in on this case.

## 2021-02-04 ENCOUNTER — Telehealth: Payer: Self-pay

## 2021-02-04 DIAGNOSIS — I480 Paroxysmal atrial fibrillation: Secondary | ICD-10-CM | POA: Diagnosis not present

## 2021-02-04 DIAGNOSIS — M79671 Pain in right foot: Secondary | ICD-10-CM | POA: Diagnosis not present

## 2021-02-04 DIAGNOSIS — N401 Enlarged prostate with lower urinary tract symptoms: Secondary | ICD-10-CM | POA: Diagnosis not present

## 2021-02-04 DIAGNOSIS — M79672 Pain in left foot: Secondary | ICD-10-CM | POA: Diagnosis not present

## 2021-02-04 DIAGNOSIS — G20 Parkinson's disease: Secondary | ICD-10-CM | POA: Diagnosis not present

## 2021-02-04 DIAGNOSIS — H353221 Exudative age-related macular degeneration, left eye, with active choroidal neovascularization: Secondary | ICD-10-CM | POA: Diagnosis not present

## 2021-02-04 DIAGNOSIS — I459 Conduction disorder, unspecified: Secondary | ICD-10-CM | POA: Diagnosis not present

## 2021-02-04 DIAGNOSIS — I739 Peripheral vascular disease, unspecified: Secondary | ICD-10-CM | POA: Diagnosis not present

## 2021-02-04 DIAGNOSIS — N39498 Other specified urinary incontinence: Secondary | ICD-10-CM | POA: Diagnosis not present

## 2021-02-04 NOTE — Telephone Encounter (Signed)
FYI to follow up previous message of refusal for hospice and call to protective services

## 2021-02-04 NOTE — Telephone Encounter (Signed)
Caller name:Chad Russell  On DPR? :Yes  Call back [phone]  Provider they see: Carlota Raspberry  Reason for call: Pt wife is calling and said no home health nurse has come out this week for him and he has bed soars now. Pt wife can not tell me who the home health was she said [facility] and [facility] and she does not know which one was his. I had asked her had she called to see why and she don;t know who to reach out. Bed Soars are open wounds.

## 2021-02-04 NOTE — Telephone Encounter (Signed)
See other message, I tried to reach them tonight at 6:15 PM.  No answer.  Left voicemail that I will try to reach out to them again tomorrow and check status, see how we can help.

## 2021-02-04 NOTE — Telephone Encounter (Signed)
6:14 PM Call placed to Chad Russell, or Chad Russell with provided mobile number -no answer.  I will try to reach out to them again tomorrow to check status at home given concerns on previous notes.

## 2021-02-06 ENCOUNTER — Emergency Department (HOSPITAL_COMMUNITY): Payer: Medicare HMO

## 2021-02-06 ENCOUNTER — Other Ambulatory Visit: Payer: Self-pay

## 2021-02-06 ENCOUNTER — Inpatient Hospital Stay (HOSPITAL_COMMUNITY)
Admission: EM | Admit: 2021-02-06 | Discharge: 2021-02-14 | DRG: 193 | Disposition: A | Discharge status: Skilled Nursing Facility | Payer: Medicare HMO | Attending: Internal Medicine | Admitting: Internal Medicine

## 2021-02-06 ENCOUNTER — Telehealth: Payer: Self-pay

## 2021-02-06 ENCOUNTER — Encounter (HOSPITAL_COMMUNITY): Payer: Self-pay

## 2021-02-06 DIAGNOSIS — H353221 Exudative age-related macular degeneration, left eye, with active choroidal neovascularization: Secondary | ICD-10-CM | POA: Diagnosis not present

## 2021-02-06 DIAGNOSIS — I10 Essential (primary) hypertension: Secondary | ICD-10-CM | POA: Diagnosis not present

## 2021-02-06 DIAGNOSIS — L89150 Pressure ulcer of sacral region, unstageable: Secondary | ICD-10-CM | POA: Diagnosis present

## 2021-02-06 DIAGNOSIS — F0280 Dementia in other diseases classified elsewhere without behavioral disturbance: Secondary | ICD-10-CM | POA: Diagnosis not present

## 2021-02-06 DIAGNOSIS — R7989 Other specified abnormal findings of blood chemistry: Secondary | ICD-10-CM | POA: Diagnosis present

## 2021-02-06 DIAGNOSIS — I4811 Longstanding persistent atrial fibrillation: Secondary | ICD-10-CM | POA: Diagnosis not present

## 2021-02-06 DIAGNOSIS — Z8249 Family history of ischemic heart disease and other diseases of the circulatory system: Secondary | ICD-10-CM

## 2021-02-06 DIAGNOSIS — Z6823 Body mass index (BMI) 23.0-23.9, adult: Secondary | ICD-10-CM | POA: Diagnosis not present

## 2021-02-06 DIAGNOSIS — R627 Adult failure to thrive: Secondary | ICD-10-CM | POA: Diagnosis present

## 2021-02-06 DIAGNOSIS — Z96643 Presence of artificial hip joint, bilateral: Secondary | ICD-10-CM | POA: Diagnosis present

## 2021-02-06 DIAGNOSIS — E46 Unspecified protein-calorie malnutrition: Secondary | ICD-10-CM | POA: Diagnosis present

## 2021-02-06 DIAGNOSIS — G20 Parkinson's disease: Secondary | ICD-10-CM | POA: Diagnosis present

## 2021-02-06 DIAGNOSIS — G4733 Obstructive sleep apnea (adult) (pediatric): Secondary | ICD-10-CM | POA: Diagnosis present

## 2021-02-06 DIAGNOSIS — J8410 Pulmonary fibrosis, unspecified: Secondary | ICD-10-CM | POA: Diagnosis not present

## 2021-02-06 DIAGNOSIS — K219 Gastro-esophageal reflux disease without esophagitis: Secondary | ICD-10-CM | POA: Diagnosis present

## 2021-02-06 DIAGNOSIS — I4891 Unspecified atrial fibrillation: Secondary | ICD-10-CM | POA: Diagnosis present

## 2021-02-06 DIAGNOSIS — L89512 Pressure ulcer of right ankle, stage 2: Secondary | ICD-10-CM | POA: Diagnosis present

## 2021-02-06 DIAGNOSIS — E860 Dehydration: Secondary | ICD-10-CM | POA: Diagnosis present

## 2021-02-06 DIAGNOSIS — N401 Enlarged prostate with lower urinary tract symptoms: Secondary | ICD-10-CM | POA: Diagnosis not present

## 2021-02-06 DIAGNOSIS — H3530 Unspecified macular degeneration: Secondary | ICD-10-CM | POA: Diagnosis present

## 2021-02-06 DIAGNOSIS — F0390 Unspecified dementia without behavioral disturbance: Secondary | ICD-10-CM | POA: Diagnosis not present

## 2021-02-06 DIAGNOSIS — R54 Age-related physical debility: Secondary | ICD-10-CM | POA: Diagnosis present

## 2021-02-06 DIAGNOSIS — R531 Weakness: Secondary | ICD-10-CM | POA: Diagnosis not present

## 2021-02-06 DIAGNOSIS — G9341 Metabolic encephalopathy: Secondary | ICD-10-CM | POA: Diagnosis present

## 2021-02-06 DIAGNOSIS — N400 Enlarged prostate without lower urinary tract symptoms: Secondary | ICD-10-CM | POA: Diagnosis present

## 2021-02-06 DIAGNOSIS — L89312 Pressure ulcer of right buttock, stage 2: Secondary | ICD-10-CM | POA: Diagnosis present

## 2021-02-06 DIAGNOSIS — Z20822 Contact with and (suspected) exposure to covid-19: Secondary | ICD-10-CM | POA: Diagnosis present

## 2021-02-06 DIAGNOSIS — E440 Moderate protein-calorie malnutrition: Secondary | ICD-10-CM | POA: Diagnosis present

## 2021-02-06 DIAGNOSIS — R001 Bradycardia, unspecified: Secondary | ICD-10-CM | POA: Diagnosis not present

## 2021-02-06 DIAGNOSIS — R600 Localized edema: Secondary | ICD-10-CM | POA: Diagnosis not present

## 2021-02-06 DIAGNOSIS — Z881 Allergy status to other antibiotic agents status: Secondary | ICD-10-CM | POA: Diagnosis not present

## 2021-02-06 DIAGNOSIS — I4892 Unspecified atrial flutter: Secondary | ICD-10-CM | POA: Diagnosis present

## 2021-02-06 DIAGNOSIS — I4819 Other persistent atrial fibrillation: Secondary | ICD-10-CM | POA: Diagnosis present

## 2021-02-06 DIAGNOSIS — I442 Atrioventricular block, complete: Secondary | ICD-10-CM | POA: Diagnosis present

## 2021-02-06 DIAGNOSIS — L8990 Pressure ulcer of unspecified site, unspecified stage: Secondary | ICD-10-CM | POA: Insufficient documentation

## 2021-02-06 DIAGNOSIS — R1310 Dysphagia, unspecified: Secondary | ICD-10-CM | POA: Diagnosis not present

## 2021-02-06 DIAGNOSIS — Z7401 Bed confinement status: Secondary | ICD-10-CM | POA: Diagnosis not present

## 2021-02-06 DIAGNOSIS — J189 Pneumonia, unspecified organism: Principal | ICD-10-CM | POA: Diagnosis present

## 2021-02-06 DIAGNOSIS — R918 Other nonspecific abnormal finding of lung field: Secondary | ICD-10-CM | POA: Diagnosis not present

## 2021-02-06 DIAGNOSIS — N3289 Other specified disorders of bladder: Secondary | ICD-10-CM | POA: Diagnosis not present

## 2021-02-06 DIAGNOSIS — U071 COVID-19: Secondary | ICD-10-CM | POA: Diagnosis not present

## 2021-02-06 DIAGNOSIS — R404 Transient alteration of awareness: Secondary | ICD-10-CM | POA: Diagnosis not present

## 2021-02-06 DIAGNOSIS — I2510 Atherosclerotic heart disease of native coronary artery without angina pectoris: Secondary | ICD-10-CM | POA: Diagnosis present

## 2021-02-06 DIAGNOSIS — M2550 Pain in unspecified joint: Secondary | ICD-10-CM | POA: Diagnosis not present

## 2021-02-06 DIAGNOSIS — R1312 Dysphagia, oropharyngeal phase: Secondary | ICD-10-CM | POA: Diagnosis not present

## 2021-02-06 DIAGNOSIS — Z87891 Personal history of nicotine dependence: Secondary | ICD-10-CM

## 2021-02-06 DIAGNOSIS — M47816 Spondylosis without myelopathy or radiculopathy, lumbar region: Secondary | ICD-10-CM | POA: Diagnosis not present

## 2021-02-06 DIAGNOSIS — N19 Unspecified kidney failure: Secondary | ICD-10-CM

## 2021-02-06 DIAGNOSIS — L89896 Pressure-induced deep tissue damage of other site: Secondary | ICD-10-CM | POA: Diagnosis not present

## 2021-02-06 DIAGNOSIS — M6281 Muscle weakness (generalized): Secondary | ICD-10-CM | POA: Diagnosis not present

## 2021-02-06 DIAGNOSIS — I517 Cardiomegaly: Secondary | ICD-10-CM | POA: Diagnosis not present

## 2021-02-06 HISTORY — DX: Parkinson's disease: G20

## 2021-02-06 HISTORY — DX: Parkinson's disease without dyskinesia, without mention of fluctuations: G20.A1

## 2021-02-06 LAB — URINALYSIS, ROUTINE W REFLEX MICROSCOPIC
Bilirubin Urine: NEGATIVE
Glucose, UA: NEGATIVE mg/dL
Hgb urine dipstick: NEGATIVE
Ketones, ur: 5 mg/dL — AB
Leukocytes,Ua: NEGATIVE
Nitrite: NEGATIVE
Protein, ur: 30 mg/dL — AB
RBC / HPF: >50 RBC/hpf — ABNORMAL HIGH (ref 0–5)
Specific Gravity, Urine: >1.046 — ABNORMAL HIGH (ref 1.005–1.030)
pH: 5 (ref 5.0–8.0)

## 2021-02-06 LAB — COMPREHENSIVE METABOLIC PANEL
ALT: 17 U/L (ref 0–44)
AST: 37 U/L (ref 15–41)
Albumin: 3 g/dL — ABNORMAL LOW (ref 3.5–5.0)
Alkaline Phosphatase: 82 U/L (ref 38–126)
Anion gap: 6 (ref 5–15)
BUN: 38 mg/dL — ABNORMAL HIGH (ref 8–23)
CO2: 26 mmol/L (ref 22–32)
Calcium: 8.9 mg/dL (ref 8.9–10.3)
Chloride: 105 mmol/L (ref 98–111)
Creatinine, Ser: 1.18 mg/dL (ref 0.61–1.24)
GFR, Estimated: 58 mL/min — ABNORMAL LOW (ref >60)
Glucose, Bld: 132 mg/dL — ABNORMAL HIGH (ref 70–99)
Potassium: 4.3 mmol/L (ref 3.5–5.1)
Sodium: 137 mmol/L (ref 135–145)
Total Bilirubin: 0.5 mg/dL (ref 0.3–1.2)
Total Protein: 6.8 g/dL (ref 6.5–8.1)

## 2021-02-06 LAB — CBC WITH DIFFERENTIAL/PLATELET
Abs Immature Granulocytes: 0.03 10*3/uL (ref 0.00–0.07)
Basophils Absolute: 0 10*3/uL (ref 0.0–0.1)
Basophils Relative: 0 %
Eosinophils Absolute: 0.1 10*3/uL (ref 0.0–0.5)
Eosinophils Relative: 1 %
HCT: 39.9 % (ref 39.0–52.0)
Hemoglobin: 12.7 g/dL — ABNORMAL LOW (ref 13.0–17.0)
Immature Granulocytes: 0 %
Lymphocytes Relative: 23 %
Lymphs Abs: 1.7 10*3/uL (ref 0.7–4.0)
MCH: 30.8 pg (ref 26.0–34.0)
MCHC: 31.8 g/dL (ref 30.0–36.0)
MCV: 96.8 fL (ref 80.0–100.0)
Monocytes Absolute: 0.9 10*3/uL (ref 0.1–1.0)
Monocytes Relative: 12 %
Neutro Abs: 4.6 10*3/uL (ref 1.7–7.7)
Neutrophils Relative %: 64 %
Platelets: 225 10*3/uL (ref 150–400)
RBC: 4.12 MIL/uL — ABNORMAL LOW (ref 4.22–5.81)
RDW: 15 % (ref 11.5–15.5)
WBC: 7.3 10*3/uL (ref 4.0–10.5)
nRBC: 0 % (ref 0.0–0.2)

## 2021-02-06 LAB — RESP PANEL BY RT-PCR (FLU A&B, COVID) ARPGX2
Influenza A by PCR: NEGATIVE
Influenza B by PCR: NEGATIVE
SARS Coronavirus 2 by RT PCR: NEGATIVE

## 2021-02-06 LAB — MAGNESIUM: Magnesium: 2.4 mg/dL (ref 1.7–2.4)

## 2021-02-06 LAB — LACTIC ACID, PLASMA
Lactic Acid, Venous: 1.8 mmol/L (ref 0.5–1.9)
Lactic Acid, Venous: 1.3 mmol/L (ref 0.5–1.9)

## 2021-02-06 LAB — APTT: aPTT: 41 seconds — ABNORMAL HIGH (ref 24–36)

## 2021-02-06 LAB — CK: Total CK: 619 U/L — ABNORMAL HIGH (ref 49–397)

## 2021-02-06 LAB — PROTIME-INR
INR: 1.9 — ABNORMAL HIGH (ref 0.8–1.2)
Prothrombin Time: 21.3 seconds — ABNORMAL HIGH (ref 11.4–15.2)

## 2021-02-06 LAB — BRAIN NATRIURETIC PEPTIDE: B Natriuretic Peptide: 332 pg/mL — ABNORMAL HIGH (ref 0.0–100.0)

## 2021-02-06 LAB — TSH: TSH: 4.139 u[IU]/mL (ref 0.350–4.500)

## 2021-02-06 MED ORDER — SODIUM CHLORIDE 0.9 % IV SOLN
500.0000 mg | INTRAVENOUS | Status: DC
  Administered 2021-02-07: 500 mg via INTRAVENOUS
  Filled 2021-02-06: qty 5

## 2021-02-06 MED ORDER — CARBIDOPA-LEVODOPA 25-100 MG PO TABS
1.5000 | ORAL_TABLET | Freq: Two times a day (BID) | ORAL | Status: DC
  Administered 2021-02-07 – 2021-02-13 (×13): 1.5 via ORAL
  Filled 2021-02-06: qty 1.5
  Filled 2021-02-06 (×3): qty 2
  Filled 2021-02-06: qty 1.5
  Filled 2021-02-06 (×4): qty 2
  Filled 2021-02-06: qty 1.5
  Filled 2021-02-06 (×3): qty 2
  Filled 2021-02-06: qty 1.5
  Filled 2021-02-06: qty 2

## 2021-02-06 MED ORDER — RIVAROXABAN 20 MG PO TABS
20.0000 mg | ORAL_TABLET | Freq: Every day | ORAL | Status: DC
  Administered 2021-02-07 – 2021-02-13 (×7): 20 mg via ORAL
  Filled 2021-02-06 (×7): qty 1

## 2021-02-06 MED ORDER — IOHEXOL 350 MG/ML SOLN
80.0000 mL | Freq: Once | INTRAVENOUS | Status: AC | PRN
  Administered 2021-02-06: 80 mL via INTRAVENOUS

## 2021-02-06 MED ORDER — SODIUM CHLORIDE 0.9 % IV SOLN
500.0000 mg | Freq: Once | INTRAVENOUS | Status: AC
  Administered 2021-02-06: 500 mg via INTRAVENOUS
  Filled 2021-02-06: qty 5

## 2021-02-06 MED ORDER — BENZONATATE 100 MG PO CAPS: 200.0000 mg | ORAL_CAPSULE | Freq: Three times a day (TID) | ORAL | Status: DC | PRN

## 2021-02-06 MED ORDER — SODIUM CHLORIDE 0.9 % IV SOLN: 1.0000 g | INTRAVENOUS | Status: DC

## 2021-02-06 MED ORDER — LACTATED RINGERS IV BOLUS
500.0000 mL | Freq: Once | INTRAVENOUS | Status: AC
  Administered 2021-02-06: 500 mL via INTRAVENOUS

## 2021-02-06 MED ORDER — ACETAMINOPHEN 325 MG PO TABS: 650.0000 mg | ORAL_TABLET | Freq: Four times a day (QID) | ORAL | Status: DC | PRN

## 2021-02-06 MED ORDER — LACTATED RINGERS IV SOLN: INTRAVENOUS | Status: AC

## 2021-02-06 MED ORDER — ACETAMINOPHEN 650 MG RE SUPP: 650.0000 mg | Freq: Four times a day (QID) | RECTAL | Status: DC | PRN

## 2021-02-06 MED ORDER — SODIUM CHLORIDE 0.9 % IV SOLN
1.0000 g | Freq: Once | INTRAVENOUS | Status: AC
  Administered 2021-02-06: 1 g via INTRAVENOUS
  Filled 2021-02-06: qty 10

## 2021-02-06 NOTE — Telephone Encounter (Signed)
Caller name:[facility])   On DPR? :Yes  Call back [phone]  Provider they see: Carlota Raspberry   Reason for call: APS has been called on caregiver health of patient has decline drastically , Bed Sore, Fecal matter all over him. [facility] wanted to make Dr aware that they will stay until APS can take over. Caregiver was calling 911 to get him off the toilet

## 2021-02-06 NOTE — ED Triage Notes (Signed)
Pt bib ems for bed sore checks and increased failure to thrive. Per ems, unclear if wife or pcp sent pt for eval. Pt confused at present.

## 2021-02-06 NOTE — ED Provider Notes (Signed)
[facility] DEPT Provider Note   CSN: [id number] Arrival date & time: 02/06/21  1639     History Chief Complaint  Patient presents with   Wound Check   Failure to [place] is a 87 y.o. male.  HPI 87 year old male brought in by EMS for failure to thrive and pressure sores. He's having worsening sacral sores.  Initially, most of the history is done from chart review.  The patient is able to tell me that he has been having worsening soreness over the wounds for a couple weeks.  He has been having normal bowel movements.  He feels generally weak.  Chart review shows that an APS case has been started.  It seems from chart review that there is concerned that the wife is too weak to help patient and the patient is not getting up out of bed unless the nurse who comes in twice a week comes in.  His wounds have been contaminated with stool and urine.  There is concerned that he is unable to be care for it at his house.  Past Medical History:  Diagnosis Date   Arthritis    Atrial fibrillation [facility])    Balance problem 10/31/2013   BPH (benign prostatic hyperplasia) 2014   Urology Dr [name] with [facility] [phone]    Bradycardia    a. baseline HR in the 30's. Asymptomatic - no history of PPM placement.    CAD (coronary artery disease)    LHC 03/16/11: Mid LAD 10-20%, proximal circumflex 10%, mid RCA 20%, EF 55%.   Cataract    Complete heart block (HCC)    Fracture of femoral neck, right (HCC) 04/19/2017   GERD (gastroesophageal reflux disease)    takes occasional  zantac   Hx of echocardiogram    Echo 02/2011: EF 65-70%, normal wall motion, MAC, mild MR, mild LAE, mild RVE   Hypertension    OSA (obstructive sleep apnea)    per patient went to have sleep study done about 2 years ago, bu they never F/U with him about results; but wife reports he still has periods of apnea at night    Talar fracture    casted no surg   UTI (urinary tract  infection) 10/2020    Patient Active Problem List   Diagnosis Date Noted   UTI (urinary tract infection) 10/14/2020   Bradycardia 10/14/2020   Educated about COVID-19 virus infection 11/29/2019   Exudative age-related macular degeneration of left eye with active choroidal neovascularization ([place]) 10/02/2019   Central retinal vein occlusion with macular edema of right eye 06/20/2019   Central retinal vein occlusion with neovascularization of right eye 06/20/2019   Ocular ischemic syndrome 06/20/2019   Retinal hemorrhage of left eye 06/20/2019   Retinal hemorrhage of right eye 06/20/2019   AKI (acute kidney injury) ([place]) 12/15/2018   Hematoma of scalp    Heart block    Chest pain 05/04/2018   Fracture of femoral neck, left ([place]) 04/19/2017   MGUS (monoclonal gammopathy of unknown significance) 01/15/2017   BPH with obstruction/lower urinary tract symptoms 09/07/2016   Preoperative cardiovascular examination 08/24/2016   Urinary retention 05/13/2016   Balance problem 10/31/2013   OSA (obstructive sleep apnea) 10/31/2013   BPH (benign prostatic hyperplasia) 08/28/2012   Atrial fibrillation ([place]) 06/08/2012   Hyperplasia of prostate with lower urinary tract symptoms (LUTS) 04/28/2012   Moderate malnutrition ([place]) 04/28/2012   Infection of urinary tract - recurrent 04/26/2012  NSTEMI (non-ST elevated myocardial infarction) ([place]) 03/15/2011   Complete heart block ([place]) 03/15/2011   HTN (hypertension) 03/15/2011    Past Surgical History:  Procedure Laterality Date   ABDOMINAL AORTOGRAM W/LOWER EXTREMITY N/A 01/17/2017   Procedure: ABDOMINAL AORTOGRAM W/LOWER EXTREMITY;  Surgeon: Angelia Mould, MD;  Location: [facility];  Service: Cardiovascular;  Laterality: N/A;   COLONOSCOPY     Repeated and normal in 2011   FRACTURE SURGERY     HEMIARTHROPLASTY HIP Right 04/19/2017   HEMORROIDECTOMY     HIP ARTHROPLASTY Right 04/19/2017   Procedure: ARTHROPLASTY BIPOLAR HIP  (HEMIARTHROPLASTY);  Surgeon: Marchia Bond, MD;  Location: [facility];  Service: Orthopedics;  Laterality: Right;   HIP ARTHROPLASTY Left 12/16/2018   Procedure: ARTHROPLASTY BIPOLAR HIP (HEMIARTHROPLASTY);  Surgeon: Altamese Stickney, MD;  Location: [facility];  Service: Orthopedics;  Laterality: Left;   INGUINAL HERNIA REPAIR Right 02/26/2014   Procedure: LAPAROSCOPIC RIGHT INGUINAL HERNIA REPAIR;  Surgeon: Ralene Ok, MD;  Location: [facility];  Service: General;  Laterality: Right;   INGUINAL HERNIA REPAIR     INSERTION OF MESH Right 02/26/2014   Procedure: INSERTION OF MESH;  Surgeon: Ralene Ok, MD;  Location: [facility];  Service: General;  Laterality: Right;   LEFT HEART CATHETERIZATION WITH CORONARY ANGIOGRAM N/A 03/16/2011   Procedure: LEFT HEART CATHETERIZATION WITH CORONARY ANGIOGRAM;  Surgeon: Peter M Martinique, MD;  Location: [facility];  Service: Cardiovascular;  Laterality: N/A;   PROSTATE BIOPSY     negative - [facility]   TRANSURETHRAL RESECTION OF PROSTATE N/A 09/07/2016   Procedure: TRANSURETHRAL RESECTION OF THE PROSTATE (TURP);  Surgeon: Irine Seal, MD;  Location: [facility];  Service: Urology;  Laterality: N/A;       Family History  Problem Relation Age of Onset   Hypertension Mother    Multiple sclerosis Sister     Social History   Tobacco Use   Smoking status: Former    Packs/day: 1.00    Years: 3.00    Pack years: 3.00    Types: Cigarettes    Quit date: 03/17/1955    Years since quitting: 65.9   Smokeless tobacco: Former  Scientific laboratory technician Use: Never used  Substance Use Topics   Alcohol use: Yes    Alcohol/week: 14.0 standard drinks    Types: 7 Cans of beer, 7 Standard drinks or equivalent per week   Drug use: No    Home Medications Prior to Admission medications   Medication Sig Start Date End Date Taking? Authorizing Provider  acetaminophen (TYLENOL) 500 MG tablet Take 1 tablet (500 mg total) by mouth every 8 (eight) hours. Patient taking differently: Take  500 mg by mouth every 8 (eight) hours as needed for moderate pain. 12/26/18   Debbe Odea, MD  calcium carbonate (TUMS - DOSED IN MG ELEMENTAL CALCIUM) 500 MG chewable tablet Chew 1 tablet (200 mg of elemental calcium total) by mouth 2 (two) times daily with a meal. Patient taking differently: Chew 1 tablet by mouth as needed. 10/20/20   Raiford Noble Latif, DO  carbidopa-levodopa (SINEMET) 25-100 MG tablet Take 1.5 tablets by mouth in the morning and at bedtime. 06/11/19   Narda Amber K, DO  docusate sodium (COLACE) 100 MG capsule Take 200 mg by mouth as needed for mild constipation.    [provider]  feeding supplement (ENSURE ENLIVE / ENSURE PLUS) LIQD Take 237 mLs by mouth daily. 10/20/20   Raiford Noble Latif, DO  HYDROcodone-acetaminophen [facility]) [phone]  MG tablet Take 1-2 tablets by mouth every 6 (six) hours as needed. 01/08/21   Veryl Speak, MD  lisinopril (ZESTRIL) 10 MG tablet Take 1 tablet (10 mg total) by mouth daily. 10/24/20 01/22/21  Lendon Colonel, NP  Misc Natural Products (PROSTATE) CAPS Take 1 tablet by mouth 2 (two) times daily. Prostate Plus otc supplement    [provider]  Multiple Vitamins-Minerals (PRESERVISION AREDS 2+MULTI VIT PO) Take 1 capsule by mouth 2 (two) times daily.    [provider]  senna (SENOKOT) 8.6 MG TABS tablet Take 1 tablet by mouth daily as needed for mild constipation.    [provider]  timolol (TIMOPTIC) 0.5 % ophthalmic solution INSTILL ONE DROP IN [facility] EYE DAILY Patient taking differently: Place 1 drop into both eyes 2 (two) times daily. 09/08/20   Rankin, Clent Demark, MD  vitamin C (ASCORBIC ACID) 500 MG tablet Take 500 mg by mouth daily.    [provider]  XARELTO 20 MG TABS tablet TAKE ONE TABLET BY MOUTH DAILY WITH SUPPER 01/06/21   Minus Breeding, MD    Allergies    Doxycycline and Keflex [cephalexin]  Review of Systems   Review of Systems  Constitutional:  Negative for fever.   Gastrointestinal:  Negative for abdominal pain.  Musculoskeletal:  Positive for back pain.  Skin:  Positive for wound.  All other systems reviewed and are negative.  Physical Exam Updated Vital Signs BP (!) 146/72 (BP Location: Right Arm)    Pulse (!) 37    Temp 99.8 F (37.7 C) (Rectal)    Resp 19    Ht 6' (1.829 m)    Wt 77 kg    SpO2 98%    BMI 23.02 kg/m   Physical Exam Vitals and nursing note reviewed.  Constitutional:      General: He is not in acute distress.    Appearance: He is well-developed.  HENT:     Head: Normocephalic and atraumatic.     Right Ear: External ear normal.     Left Ear: External ear normal.     Nose: Nose normal.  Eyes:     General:        Right eye: No discharge.        Left eye: No discharge.  Cardiovascular:     Rate and Rhythm: Bradycardia present. Rhythm irregular.     Heart sounds: Normal heart sounds.  Pulmonary:     Effort: Pulmonary effort is normal.     Breath sounds: Normal breath sounds.  Abdominal:     General: There is no distension.     Palpations: Abdomen is soft.     Tenderness: There is no abdominal tenderness.  Musculoskeletal:     Cervical back: Neck supple.     Right lower leg: No edema.     Left lower leg: No edema.  Skin:    General: Skin is warm and dry.       Neurological:     Mental Status: He is alert.  Psychiatric:        Mood and Affect: Mood is not anxious.    ED Results / Procedures / Treatments   Labs (all labs ordered are listed, but only abnormal results are displayed) Labs Reviewed  COMPREHENSIVE METABOLIC PANEL - Abnormal; Notable for the following components:      Result Value   Glucose, Bld 132 (*)    BUN 38 (*)    Albumin 3.0 (*)    GFR,  Estimated 58 (*)    All other components within normal limits  CBC WITH DIFFERENTIAL/PLATELET - Abnormal; Notable for the following components:   RBC 4.12 (*)    Hemoglobin 12.7 (*)    All other components within normal limits  PROTIME-INR - Abnormal;  Notable for the following components:   Prothrombin Time 21.3 (*)    INR 1.9 (*)    All other components within normal limits  APTT - Abnormal; Notable for the following components:   aPTT 41 (*)    All other components within normal limits  CULTURE, BLOOD (ROUTINE X 2)  CULTURE, BLOOD (ROUTINE X 2)  URINE CULTURE  RESP PANEL BY RT-PCR (FLU A&B, COVID) ARPGX2  LACTIC ACID, PLASMA  LACTIC ACID, PLASMA  URINALYSIS, ROUTINE W REFLEX MICROSCOPIC  METHYLMALONIC ACID, SERUM  TSH  PROCALCITONIN  MAGNESIUM  MAGNESIUM  PHOSPHORUS  COMPREHENSIVE METABOLIC PANEL  CBC WITH DIFFERENTIAL/PLATELET    EKG EKG Interpretation  Date/Time:  Friday February 06 2021 17:06:18 EST Ventricular Rate:  39 PR Interval:    QRS Duration: 157 QT Interval:  531 QTC Calculation: 428 R Axis:   39 Text Interpretation: A-flutter/fibrillation w/ complete AV block Ventricular premature complex Right bundle branch block similar to April 2021 Confirmed by Sherwood Gambler [phone]) on 02/06/2021 5:44:24 PM  Radiology CT PELVIS W CONTRAST  Result Date: 02/06/2021 CLINICAL DATA:  Possible decubitus wound EXAM: CT PELVIS WITH CONTRAST TECHNIQUE: Multidetector CT imaging of the pelvis was performed using the standard protocol following the bolus administration of intravenous contrast. CONTRAST:  40m OMNIPAQUE IOHEXOL 350 MG/ML SOLN COMPARISON:  01/08/2021 FINDINGS: Urinary Tract: Bladder is well distended and somewhat obscured by scatter artifact from bilateral hip replacements. No gross abnormality is noted. Bowel: No obstructive changes are seen. Retained fecal material is noted throughout the visualized colon. Additionally some fecal material appears to lie within the intergluteal cleft. Vascular/Lymphatic: Diffuse vascular calcifications are noted. No aneurysmal dilatation is seen. No lymphadenopathy is noted. Reproductive: Prostate is obscured by scatter artifact from the hip replacements. Other:  No definitive free  fluid is seen. Musculoskeletal: Bilateral hip replacements are seen creating considerable scatter artifact. Some skin thickening is noted posteriorly as well as some subcutaneous edema which may represent some early decubitus changes without definitive skin breakdown. No discrete bony erosive changes are noted. Degenerative changes of lumbar spine are noted. IMPRESSION: No discrete decubitus wound is identified. No bony erosive changes are seen. Skin thickening and some subcutaneous edema is noted however. Fecal material within the gluteal cleft likely related to recent bowel movement No other acute abnormality is noted. Electronically Signed   By: MInez CatalinaM.D.   On: 02/06/2021 19:09   DG Chest Port 1 View  Result Date: 02/06/2021 CLINICAL DATA:  Questionable sepsis EXAM: PORTABLE CHEST 1 VIEW COMPARISON:  CT chest dated January 08, 2021 FINDINGS: The heart is mildly enlarged. Atherosclerotic calcification of the aortic arch. Bilateral lower lobe fibrotic changes and pleural calcification are unchanged. Right midlung opacity which may represent atelectasis or infiltrate. IMPRESSION: 1.  Stable cardiomegaly. 2. Bibasilar fibrotic changes and pleural calcifications, likely secondary to prior asbestos exposure. 3. Left midlung opacity which may represent atelectasis or infiltrate. Electronically Signed   By: IKeane PoliceD.O.   On: 02/06/2021 17:26    Procedures Procedures   Medications Ordered in ED Medications  cefTRIAXone (ROCEPHIN) 1 g in sodium chloride 0.9 % 100 mL IVPB (has no administration in time range)  azithromycin (ZITHROMAX) 500 mg in sodium chloride  0.9 % 250 mL IVPB (has no administration in time range)  acetaminophen (TYLENOL) tablet 650 mg (has no administration in time range)    Or  acetaminophen (TYLENOL) suppository 650 mg (has no administration in time range)  cefTRIAXone (ROCEPHIN) 1 g in sodium chloride 0.9 % 100 mL IVPB (has no administration in time range)  azithromycin  (ZITHROMAX) 500 mg in sodium chloride 0.9 % 250 mL IVPB (has no administration in time range)  benzonatate (TESSALON) capsule 200 mg (has no administration in time range)  lactated ringers bolus 500 mL (0 mLs Intravenous Stopped 02/06/21 1808)  iohexol (OMNIPAQUE) 350 MG/ML injection 80 mL (80 mLs Intravenous Contrast Given 02/06/21 1848)    ED Course  I have reviewed the triage vital signs and the nursing notes.  Pertinent labs & imaging results that were available during my care of the patient were reviewed by me and considered in my medical decision making (see chart for details).    MDM Rules/Calculators/A&P                         Patient overall appears to be a failure to thrive.  I discussed extensively with the wife and it seems like since his fall back in November he has been weaker, more stiff in his extremities from Parkinson's, and not eating and drinking as much.  He has been choking on liquids and coughing when he tries to swallow.  He has not had a cough otherwise though his x-ray is concerning for a possible pneumonia.  We will treat with some antibiotics.  He does not have any significant electrolyte abnormality or renal failure but I think he will need admission for physical therapy, and overall supportive care.  The wounds appear to be relatively new but no evidence of deep space infection or osteomyelitis.    Final Clinical Impression(s) / ED Diagnoses Final diagnoses:  Community acquired pneumonia of left lung, unspecified part of lung  Failure to thrive in adult    Rx / DC Orders ED Discharge Orders     None        Sherwood Gambler, MD 02/06/21 2221

## 2021-02-06 NOTE — Telephone Encounter (Signed)
See other notes. Unable to reach on phone call 12/14. Called Moshe Salisbury - nurse manager with  Nurse has been visiting 2 times per week. Last visit last Thursday. Teaching has been provided for dressing changes for bedsores. Some concern that caregiver was not able to do dressing changes. Caregiver reported to [place] that she was too weak to get him out of bed. Reportedly has called 911 to assist with moving off toilet - caregiver too weak to help him off toilet and to get out of bed, reports she is too weak to be able to help him. Has been found to have bedsores, and in stool or urine when therapists have arrived on multiple occasions. Social worker with [facility] has tried to come out to their house multiple times and has been refused. Report was placed Adult Protective Services yesterday by Education officer, museum with [facility] - Denman George at [employer]. DSS planned on eval/report. No updates received yet from DSS.   Called [facility] Stabs and [place]. Spoke with Dyann Kief on speakerphone.  Has had persistent soreness on his side after fall - seen in ER 11/16. Has not been able to walk or stand on his feet since coming home from ER at that time. More stiffness since coming from ER. Mardene Celeste suspects this is his parkinson's progressing.  Did walk with physical therapist once with assistance last week and week before, but unable to walk otherwise, Mardene Celeste is unable to assist him due to some weakness of her own.  Reports he has not seen nurse since last Tuesday. Prior 2 times per week.  Has friend they have paid to come by a few times (5 and 2 days ago) with a blanket to help move him in bed, apply bandages to bedsores and to help with cleaning him. but not able to assist to bathroom. Stooling and urinating into bed. Changed linens 2 times this week, but Mardene Celeste has applied  pads and dry bedclothes frequently under him to keep him dry.  Bedside urinal has been used recently. Bedsores there for over a week. In bed at  all times unless therapist in home (2 times per week). They have assisted him to bathroom, otherwise in bed.  Visit tomorrow from [facility]. Unknown provider.   Ankle wound has been healing, but now dark circle around wound - not sure if that has been there before. No fever. Mardene Celeste has been trying to get him to eat/drink. Some po fluids and eating some. At least 16 ounces fluid per day, not wanting to drink. Not talking as much past few days. Speaking with only a few words, more trouble initiating speech. No new focal weakness per Mardene Celeste.  I expressed concerns with his weakness past month since fall, increased rigidity, incontinence and new bedsores, possible progression of erythema of ankle wound, but more concerning is change in status past few days with more difficulty with speech. Some difficulty with fluid intake possibly as well. ER eval recommended today to eval wounds including bedsores/pressure wounds and ankle wound, Eval for change in speech past few days, eval for possible neurologic insult vs infection vs volume depletion.  Also may need ER social work assessment or inpatient assessment for disposition as current needs may be more of skilled nursing facility level due to difficulty with assisting him at home. Understanding expressed from spouse Mardene Celeste and plans to call EMS for transport for eval today.  36min call.

## 2021-02-06 NOTE — Telephone Encounter (Signed)
Caller name:Eulice Burgen ([facility])   On DPR? :Yes  Call back number:[phone]  Provider they see: Carlota Raspberry  Reason for call:[facility] is calling for verbal orders Home Health PT  1x week for 8 weeks

## 2021-02-06 NOTE — H&P (Signed)
History and Physical    PLEASE NOTE THAT DRAGON DICTATION SOFTWARE WAS USED IN THE CONSTRUCTION OF THIS NOTE.   Chad Russell RAQ:[id number] DOB: [birth year] DOA: 02/06/2021  PCP: [name], MD  Patient coming from: home   I have personally briefly reviewed patient's old medical records in [place]  Chief Complaint: Generalized weakness  HPI: Chad Russell is a 87 y.o. Russell with medical history significant for persistent atrial fibrillation chronically anticoagulated on Xarelto, chronic bradycardia, Parkinson's disease complicated by dementia, hypertension, who is admitted to [facility] on 02/06/2021 with community-acquired pneumonia after presenting from home to [facility] ED for evaluation of generalized weakness.  In the setting of the patient's presenting mental status, and associated limited ability to contribute to the history, the following HPI is provided via my discussions with the patient's wife, who is present at bedside, in addition to my discussions with the EDP, and via chart review.  Wife confirms that the patient has a history of Parkinson's disease complicated by dementia, for which the patient follows with Dr. Posey Pronto as his outpatient neurologist.  Wife conveys that the patient has not seen Dr. Posey Pronto over the last 3 years, in part due to COVID-19 climate, but states that the patient has his next appointment with Dr. Posey Pronto over the next few weeks.   Over the last few weeks, wife conveys that the patient has exhibited progressive evidence of generalized weakness in the absence of any evidence of acute focal weakness or complaint of acute focal sensory change.  As a consequence of this generalized weakness, patient now with difficulty ambulating, even with four-wheel walker, which at baseline, he is able to use with assistance, including assistance with transfers.  Consequently, the patient has been spending progressive periods of time over the last few  weeks in a nonambulatory state, mainly laying in bed.  Wife conveys that they recently had home health, including home health PT, with associated RN coming out to their house twice per week to work with the patient.  However, due to some reported staffing issues, wife reports that it has been proximately 2 weeks since the timing of most recent home PT session.   In the setting of underlying dementia as a consequence of his Parkinson's disease, wife has not noted any interval worsening in the patient's degree of confusion, but has noted the patient to be somnolent relative to baseline over the last several days.  This is been concomitant with new onset nonproductive cough.   Per chart review, patient has history of atrial fibrillation for which she is chronically anticoagulated on Xarelto.  Additionally, there is documentation of a history of chronic bradycardia with baseline heart rates in the 30s associated with complete heart block associated documentation and no history of associated symptoms of hemodynamic instability prompting no procedure pacemaker.  Per review of prior EKGs, including EKG from April 2021 there is evidence of prior complete heart block .wife confirms that he vomits in the high 30s to low 40s are consistent with the patient's baseline heart rate.  Not on any AV nodal blocking agents at home.     ED Course:  Vital signs in the ED were notable for the following: Temperature max 99.8; heart rate 40-46; blood pressure 137/64 -161/67; respiratory rate 17-24, oxygen saturation 95 to 100% on room air.  Labs were notable for the following: CMP notable for the following: BUN 38, creatinine 1.18 relative to most recent prior serum creatinine data point of  1.13 on 01/08/2021, BUN to creatinine ratio 32, calcium corrected for mild hypoalbuminemia at 9.8, albumin 3.0, otherwise, liver enzymes found to be within normal limits.  Lactic acid 1.8, with repeat value trending down to 1.3.  CBC notable  for white blood cell count 7300, hemoglobin 12.7 compared to most recent prior hemoglobin data point of 11.8 on 01/08/2021.  Urinalysis has been ordered, with result currently pending.  COVID-19/influenza PCR were ordered in the ED today, with results currently pending.  Blood cultures x2 were collected prior to initiation of IV antibiotics.  Imaging and additional notable ED work-up: EKG shows atrial fibrillation with complete AV block, with PVC, ventricular rate 39, right bundle branch block, similar in appearance to April 2021, without evidence of acute T wave/ST changes.  Chest x-ray shows stable cardiomegaly, with left midlung airspace opacity consistent with infiltrate and concerning for pneumonia, while demonstrating no evidence of edema, effusion, or pneumothorax.  CT pelvis with contrast to evaluate posterior skin breakdown showed no evidence of discrete decubitus wound, as well as no evidence of abscess or bony erosion.  While in the ED, the following were administered: Azithromycin, Rocephin, 500 cc LR bolus.  Subsequently, the patient is admitted to the med telemetry unit at [facility] for further evaluation and management of community-acquired pneumonia in setting of presenting generalized weakness acute metabolic encephalopathy.      Review of Systems: As per HPI otherwise 10 point review of systems negative.   Past Medical History:  Diagnosis Date   Arthritis    Atrial fibrillation [facility])    Balance problem 10/31/2013   BPH (benign prostatic hyperplasia) 2014   Urology Dr [name] with [facility] [phone]    Bradycardia    a. baseline HR in the 30's. Asymptomatic - no history of PPM placement.    CAD (coronary artery disease)    LHC 03/16/11: Mid LAD 10-20%, proximal circumflex 10%, mid RCA 20%, EF 55%.   Cataract    Complete heart block (HCC)    Fracture of femoral neck, right ([place]) 04/19/2017   GERD (gastroesophageal reflux disease)    takes occasional  zantac   Hx of  echocardiogram    Echo 02/2011: EF 65-70%, normal wall motion, MAC, mild MR, mild LAE, mild RVE   Hypertension    OSA (obstructive sleep apnea)    per patient went to have sleep study done about 2 years ago, bu they never F/U with him about results; but wife reports he still has periods of apnea at night    Talar fracture    casted no surg   UTI (urinary tract infection) 10/2020    Past Surgical History:  Procedure Laterality Date   ABDOMINAL AORTOGRAM W/LOWER EXTREMITY N/A 01/17/2017   Procedure: ABDOMINAL AORTOGRAM W/LOWER EXTREMITY;  Surgeon: Angelia Mould, MD;  Location: [facility];  Service: Cardiovascular;  Laterality: N/A;   COLONOSCOPY     Repeated and normal in 2011   FRACTURE SURGERY     HEMIARTHROPLASTY HIP Right 04/19/2017   HEMORROIDECTOMY     HIP ARTHROPLASTY Right 04/19/2017   Procedure: ARTHROPLASTY BIPOLAR HIP (HEMIARTHROPLASTY);  Surgeon: Marchia Bond, MD;  Location: [facility];  Service: Orthopedics;  Laterality: Right;   HIP ARTHROPLASTY Left 12/16/2018   Procedure: ARTHROPLASTY BIPOLAR HIP (HEMIARTHROPLASTY);  Surgeon: Altamese , MD;  Location: [facility];  Service: Orthopedics;  Laterality: Left;   INGUINAL HERNIA REPAIR Right 02/26/2014   Procedure: LAPAROSCOPIC RIGHT INGUINAL HERNIA REPAIR;  Surgeon: Ralene Ok, MD;  Location:  [place];  Service: General;  Laterality: Right;   INGUINAL HERNIA REPAIR     INSERTION OF MESH Right 02/26/2014   Procedure: INSERTION OF MESH;  Surgeon: Ralene Ok, MD;  Location: [facility];  Service: General;  Laterality: Right;   LEFT HEART CATHETERIZATION WITH CORONARY ANGIOGRAM N/A 03/16/2011   Procedure: LEFT HEART CATHETERIZATION WITH CORONARY ANGIOGRAM;  Surgeon: Peter M Martinique, MD;  Location: [facility] CATH LAB;  Service: Cardiovascular;  Laterality: N/A;   PROSTATE BIOPSY     negative - [facility]   TRANSURETHRAL RESECTION OF PROSTATE N/A 09/07/2016   Procedure: TRANSURETHRAL RESECTION OF THE PROSTATE (TURP);  Surgeon:  Irine Seal, MD;  Location: [facility];  Service: Urology;  Laterality: N/A;    Social History:  reports that he quit smoking about 65 years ago. His smoking use included cigarettes. He has a 3.00 pack-year smoking history. He has quit using smokeless tobacco. He reports current alcohol use of about 14.0 standard drinks per week. He reports that he does not use drugs.   Allergies  Allergen Reactions   Doxycycline Other (See Comments)    Upset stomach   Keflex [Cephalexin] Other (See Comments)    Abdominal discomfort 04-19-17 Pt has tolerated orally    Family History  Problem Relation Age of Onset   Hypertension Mother    Multiple sclerosis Sister     Family history reviewed and not pertinent    Prior to Admission medications   Medication Sig Start Date End Date Taking? Authorizing Provider  acetaminophen (TYLENOL) 500 MG tablet Take 1 tablet (500 mg total) by mouth every 8 (eight) hours. Patient taking differently: Take 500 mg by mouth every 8 (eight) hours as needed for moderate pain. 12/26/18   Debbe Odea, MD  calcium carbonate (TUMS - DOSED IN MG ELEMENTAL CALCIUM) 500 MG chewable tablet Chew 1 tablet (200 mg of elemental calcium total) by mouth 2 (two) times daily with a meal. Patient taking differently: Chew 1 tablet by mouth as needed. 10/20/20   Raiford Noble Latif, DO  carbidopa-levodopa (SINEMET) 25-100 MG tablet Take 1.5 tablets by mouth in the morning and at bedtime. 06/11/19   Narda Amber K, DO  docusate sodium (COLACE) 100 MG capsule Take 200 mg by mouth as needed for mild constipation.    [provider]  feeding supplement (ENSURE ENLIVE / ENSURE PLUS) LIQD Take 237 mLs by mouth daily. 10/20/20   Raiford Noble Latif, DO  HYDROcodone-acetaminophen (NORCO) 5-325 MG tablet Take 1-2 tablets by mouth every 6 (six) hours as needed. 01/08/21   Veryl Speak, MD  lisinopril (ZESTRIL) 10 MG tablet Take 1 tablet (10 mg total) by mouth daily. 10/24/20 01/22/21  Lendon Colonel, NP  Misc Natural Products (PROSTATE) CAPS Take 1 tablet by mouth 2 (two) times daily. Prostate Plus otc supplement    [provider]  Multiple Vitamins-Minerals (PRESERVISION AREDS 2+MULTI VIT PO) Take 1 capsule by mouth 2 (two) times daily.    [provider]  senna (SENOKOT) 8.6 MG TABS tablet Take 1 tablet by mouth daily as needed for mild constipation.    [provider]  timolol (TIMOPTIC) 0.5 % ophthalmic solution INSTILL ONE DROP IN [facility] EYE DAILY Patient taking differently: Place 1 drop into both eyes 2 (two) times daily. 09/08/20   Rankin, Clent Demark, MD  vitamin C (ASCORBIC ACID) 500 MG tablet Take 500 mg by mouth daily.    [provider]  XARELTO 20 MG TABS tablet TAKE ONE TABLET  BY MOUTH DAILY WITH SUPPER 01/06/21   Minus Breeding, MD     Objective    Physical Exam: Vitals:   02/06/21 1830 02/06/21 1845 02/06/21 1921 02/06/21 1930  BP: (!) 142/70 (!) 157/66 (!) 161/64 (!) 159/66  Pulse: (!) 38 (!) 35 75 (!) 26  Resp: (!) 23 17 (!) 25 (!) 23  Temp:      TempSrc:      SpO2: 100% 99% 96% 92%  Weight:      Height:        General: appears to be stated age; somnolent, will briefly open eyes to verbal stimuli, before falling back asleep Skin: warm, dry, no rash Head:  AT/[place] Mouth:  Oral mucosa membranes appear dry, normal dentition Neck: supple; trachea midline Heart: Bradycardic; did not appreciate any M/R/G Lungs: CTAB, did not appreciate any wheezes, rales, or rhonchi Abdomen: + BS; soft, ND, NT Vascular: 2+ pedal pulses b/l; 2+ radial pulses b/l Extremities: no peripheral edema, no muscle wasting Neuro: In the setting of the patient's current mental status and associated inability to follow instructions, unable to perform full neurologic exam at this time.  As such, assessment of strength, sensation, and cranial nerves is limited at this time. Patient noted to spontaneously move all 4 extremities.   Labs on Admission: I  have personally reviewed following labs and imaging studies  CBC: Recent Labs  Lab 02/06/21 1718  WBC 7.3  NEUTROABS 4.6  HGB 12.7*  HCT 39.9  MCV 96.8  PLT 196   Basic Metabolic Panel: Recent Labs  Lab 02/06/21 1718  NA 137  K 4.3  CL 105  CO2 26  GLUCOSE 132*  BUN 38*  CREATININE 1.18  CALCIUM 8.9   GFR: Estimated Creatinine Clearance: 43.5 mL/min (by C-G formula based on SCr of 1.18 mg/dL). Liver Function Tests: Recent Labs  Lab 02/06/21 1718  AST 37  ALT 17  ALKPHOS 82  BILITOT 0.5  PROT 6.8  ALBUMIN 3.0*   No results for input(s): LIPASE, AMYLASE in the last 168 hours. No results for input(s): AMMONIA in the last 168 hours. Coagulation Profile: Recent Labs  Lab 02/06/21 1718  INR 1.9*   Cardiac Enzymes: No results for input(s): CKTOTAL, CKMB, CKMBINDEX, TROPONINI in the last 168 hours. BNP (last 3 results) No results for input(s): PROBNP in the last 8760 hours. HbA1C: No results for input(s): HGBA1C in the last 72 hours. CBG: No results for input(s): GLUCAP in the last 168 hours. Lipid Profile: No results for input(s): CHOL, HDL, LDLCALC, TRIG, CHOLHDL, LDLDIRECT in the last 72 hours. Thyroid Function Tests: No results for input(s): TSH, T4TOTAL, FREET4, T3FREE, THYROIDAB in the last 72 hours. Anemia Panel: No results for input(s): VITAMINB12, FOLATE, FERRITIN, TIBC, IRON, RETICCTPCT in the last 72 hours. Urine analysis:    Component Value Date/Time   COLORURINE YELLOW 10/14/2020 1500   APPEARANCEUR TURBID (A) 10/14/2020 1500   LABSPEC 1.018 10/14/2020 1500   PHURINE 6.0 10/14/2020 1500   GLUCOSEU NEGATIVE 10/14/2020 1500   HGBUR SMALL (A) 10/14/2020 1500   BILIRUBINUR NEGATIVE 10/14/2020 1500   BILIRUBINUR negative 01/24/2020 1749   BILIRUBINUR negative 04/01/2018 1148   KETONESUR NEGATIVE 10/14/2020 1500   PROTEINUR 100 (A) 10/14/2020 1500   UROBILINOGEN 0.2 01/24/2020 1749   UROBILINOGEN 1.0 04/27/2012 0019   NITRITE NEGATIVE  10/14/2020 1500   LEUKOCYTESUR LARGE (A) 10/14/2020 1500    Radiological Exams on Admission: CT PELVIS W CONTRAST  Result Date: 02/06/2021 CLINICAL DATA:  Possible decubitus  wound EXAM: CT PELVIS WITH CONTRAST TECHNIQUE: Multidetector CT imaging of the pelvis was performed using the standard protocol following the bolus administration of intravenous contrast. CONTRAST:  91m OMNIPAQUE IOHEXOL 350 MG/ML SOLN COMPARISON:  01/08/2021 FINDINGS: Urinary Tract: Bladder is well distended and somewhat obscured by scatter artifact from bilateral hip replacements. No gross abnormality is noted. Bowel: No obstructive changes are seen. Retained fecal material is noted throughout the visualized colon. Additionally some fecal material appears to lie within the intergluteal cleft. Vascular/Lymphatic: Diffuse vascular calcifications are noted. No aneurysmal dilatation is seen. No lymphadenopathy is noted. Reproductive: Prostate is obscured by scatter artifact from the hip replacements. Other:  No definitive free fluid is seen. Musculoskeletal: Bilateral hip replacements are seen creating considerable scatter artifact. Some skin thickening is noted posteriorly as well as some subcutaneous edema which may represent some early decubitus changes without definitive skin breakdown. No discrete bony erosive changes are noted. Degenerative changes of lumbar spine are noted. IMPRESSION: No discrete decubitus wound is identified. No bony erosive changes are seen. Skin thickening and some subcutaneous edema is noted however. Fecal material within the gluteal cleft likely related to recent bowel movement No other acute abnormality is noted. Electronically Signed   By: MInez CatalinaM.D.   On: 02/06/2021 19:09   DG Chest Port 1 View  Result Date: 02/06/2021 CLINICAL DATA:  Questionable sepsis EXAM: PORTABLE CHEST 1 VIEW COMPARISON:  CT chest dated January 08, 2021 FINDINGS: The heart is mildly enlarged. Atherosclerotic  calcification of the aortic arch. Bilateral lower lobe fibrotic changes and pleural calcification are unchanged. Right midlung opacity which may represent atelectasis or infiltrate. IMPRESSION: 1.  Stable cardiomegaly. 2. Bibasilar fibrotic changes and pleural calcifications, likely secondary to prior asbestos exposure. 3. Left midlung opacity which may represent atelectasis or infiltrate. Electronically Signed   By: IKeane PoliceD.O.   On: 02/06/2021 17:26     EKG: Independently reviewed, with result as described above.    Assessment/Plan    Principal Problem:   CAP (community acquired pneumonia) Active Problems:   HTN (hypertension)   Atrial fibrillation (HCC)   Generalized weakness   Acute metabolic encephalopathy   Dehydration   Acute prerenal azotemia   Parkinson disease (HCC)   Protein calorie malnutrition [place]      #) Community-acquired pneumonia: Diagnosis on the basis of new onset nonproductive cough with borderline temperature elevation, and presenting chest x-ray showing interval development of right midlung airspace opacity consistent with infiltrate concerning for pneumonia.  In the absence of formal objective fever and in the absence of leukocytosis, SIRS criteria Are not met at this time for sepsis.  Of note, COVID-19/influenza PCR results are currently pending.  Blood cultures x2 collected prior to initiation of IV azithromycin/Rocephin in the ED today.  Lactic acid nonelevated x2.  Potentially representing a contributory factor leading to presenting generalized weakness complicated by suspected acute metabolic encephalopathy, as further detailed below.   Plan: Monitor for results of blood cultures x2 collected in the ED today prior to initiation of antibiotics. Abx: Continue azithromycin/Rocephin.  Repeat CBC with differential in the morning.  Monitor continuous pulse oximetry.  Monitor on telemetry. Prn acetaminophen for fever.  Add on procalcitonin.  Follow for result  of COVID-19/influenza PCR.  Check UA.  Strep pneumoniae urine antigen.  Flutter valve.  Add on BNP.        #) Generalized weakness: Recent progression of generalized weakness, in the absence of any evidence of acute focal neurologic deficits, relative  to baseline Parkinson's disease, with recent worsening of generalized weakness resulting in progressive difficulty with ambulation relative to baseline abilities, as further detailed above, and further complicated by reported staffing issues relating to health PT, resulting in no associated therapy sessions over the last 2 weeks per patient's wife.  Suspect multifactorial contributions, including from potential community-acquired pneumonia, as above, element of deconditioning given recent increase in diminished ambulatory status, potential contribution from intramuscular, potential contribution from nutritional status in the setting of protein calorie malnutrition, as further detailed below.  No additional overt source of infection at this time, although urinalysis as well as COVID-19/influenza PCR results are pending at this time.    Plan: work-up and management of presenting suspected community-acquired pneumonia, including IV antibiotics, as described above. Physical therapy/Occupational Therapy consults have been placed for the a.m. Fall precautions. Check TSH, MMA.  Follow for results of UA, COVID-19/influenza PCR.  Further evaluation/management of suspected protein calorie malnutrition, as further detailed below.         #) Dehydration: Clinical suspicion for such, including the appearance of dry oral mucous membranes as well as laboratory findings notable for acute prerenal azotemia. Appears to be in the setting of   recent decline in oral intake, including out of water, with contributions from recent decline in ambulatory status due to generalized weakness, as well as recent increase in somnolence.  No e/o associated hypotension.   Plan:  Monitor strict I's and O's.  Daily weights.  Repeat BMP in the morning. IVF's in form of LR at 50 cc/h x 8 hours.  Follow-up result UA.  Dietitian consultation, as below       #) Acute metabolic encephalopathy: Recent progression of somnolence relative to baseline level of alertness and functionality.  In the context of known underlying dementia as a consequence of his Parkinson's disease, the patient's wife has not noted the patient to exhibit any recent worsening of confusion.  Suspect multifactorial contributions to perceived acute metabolic encephalopathy, including physiologic stress stemming from suspected community-acquired pneumonia as well as medications from dehydration, suboptimal nutrition.   Plan: Keep n.p.o. until patient's mental status improves sufficiently that he is able to participate in and pass nursing bedside swallow screen.  Further evaluation/management of suspected community-acquired pneumonia, as above.  Gentle IV fluids, as above.  Dietitian consulted.  Check ammonia level, CPK, blood gas.  Follow-up results of UA, COVID-19/influenza PCR.  CMP/CBC in the morning.        #) Persistent atrial fibrillation: Documented history of such. In the setting of a CHA2DS2-VASc score of 4, there is an indication for the patient to be on chronic anticoagulation for thromboembolic prophylaxis. Consistent with this, the patient is chronically anticoagulated on Xarelto. Home AV nodal blocking regimen: None.  Most recent echocardiogram occurred in January 2018, it was notable for moderately dilated left ventricle, LVEF 50 to 55%, severely dilated left atrium, mild mitral regurgitation.   Plan: monitor strict I's & O's and daily weights. Repeat BMP and CBC in the morning. Check serum magnesium level. Continue home Xarelto.      #) Parkinson's disease: Documented history of such, complicated by associated dementia.  Follows with Dr. Posey Pronto as outpatient neurologist.  On Sinemet as  outpatient.  Per wife, neck scheduled outpatient neurology follow-up set to occur over the next 2 weeks.  Plan: Continue Sinemet.  Encourage patient/his wife to attend upcoming scheduled G follow-up.        #) Essential Hypertension: documented h/o such, with outpatient antihypertensive regimen  including lisinopril.  SBP's in the ED today: 130s  mmHg.  In setting of presenting infectious process and clinical suspicion for dehydration, will hold home lisinopril for now.    Plan: Close monitoring of subsequent BP via routine VS. hold home lisinopril for now.  Repeat BMP in the morning.  Further evaluation/management of suspected [facility] pneumonia, as above.        #) Protein calorie malnutrition: Suspected mild protein calorie malnutrition and is 77 kg patient with bmi 23, with contributions from recent decline in oral intake, as further detailed above, potentially "patient's presenting generalized weakness.   Plan: Check prealbumin level.  I have placed consult with dietitian for assistance with recommendations for nutritional optimization, including recommendations regarding potential supplements for this purpose.  Daily weights.       DVT prophylaxis: Continue home Xarelto Code Status: Full code(confirmed per my discussions with the patient's wife this evening) Family Communication: I discussed the patient's case with the patient's wife, who is present at bedside Disposition Plan: Per Rounding Team Consults called: none;  Admission status: Inpatient; med telemetry  Warrants inpatient status on basis of need for further evaluation and management of new diagnosis of suspected community-acquired pneumonia provision of IV antibiotics while also undergoing further evaluation and management of suspected acute metabolic encephalopathy, including expansion of laboratory evaluation and initiation of supportive therapies, as outlined above, in addition to requiring IV fluid  resuscitative efforts and progressive dehydration as a consequence of worsening oral intake on an outpatient basis.   PLEASE NOTE THAT DRAGON DICTATION SOFTWARE WAS USED IN THE CONSTRUCTION OF THIS NOTE.   [place] DO [facility] [place] [facility]   02/06/2021, 8:10 PM

## 2021-02-07 DIAGNOSIS — R531 Weakness: Secondary | ICD-10-CM

## 2021-02-07 DIAGNOSIS — G9341 Metabolic encephalopathy: Secondary | ICD-10-CM | POA: Diagnosis present

## 2021-02-07 DIAGNOSIS — E860 Dehydration: Secondary | ICD-10-CM | POA: Diagnosis present

## 2021-02-07 DIAGNOSIS — E46 Unspecified protein-calorie malnutrition: Secondary | ICD-10-CM | POA: Diagnosis present

## 2021-02-07 DIAGNOSIS — N19 Unspecified kidney failure: Secondary | ICD-10-CM | POA: Diagnosis present

## 2021-02-07 DIAGNOSIS — G20 Parkinson's disease: Secondary | ICD-10-CM | POA: Diagnosis present

## 2021-02-07 LAB — CBC WITH DIFFERENTIAL/PLATELET
Abs Immature Granulocytes: 0.04 10*3/uL (ref 0.00–0.07)
Basophils Absolute: 0 10*3/uL (ref 0.0–0.1)
Basophils Relative: 1 %
Eosinophils Absolute: 0.1 10*3/uL (ref 0.0–0.5)
Eosinophils Relative: 1 %
HCT: 37.3 % — ABNORMAL LOW (ref 39.0–52.0)
Hemoglobin: 11.9 g/dL — ABNORMAL LOW (ref 13.0–17.0)
Immature Granulocytes: 1 %
Lymphocytes Relative: 26 %
Lymphs Abs: 1.9 10*3/uL (ref 0.7–4.0)
MCH: 30.5 pg (ref 26.0–34.0)
MCHC: 31.9 g/dL (ref 30.0–36.0)
MCV: 95.6 fL (ref 80.0–100.0)
Monocytes Absolute: 1.1 10*3/uL — ABNORMAL HIGH (ref 0.1–1.0)
Monocytes Relative: 15 %
Neutro Abs: 4.3 10*3/uL (ref 1.7–7.7)
Neutrophils Relative %: 56 %
Platelets: 212 10*3/uL (ref 150–400)
RBC: 3.9 MIL/uL — ABNORMAL LOW (ref 4.22–5.81)
RDW: 14.8 % (ref 11.5–15.5)
WBC: 7.4 10*3/uL (ref 4.0–10.5)
nRBC: 0 % (ref 0.0–0.2)

## 2021-02-07 LAB — COMPREHENSIVE METABOLIC PANEL
ALT: 23 U/L (ref 0–44)
AST: 40 U/L (ref 15–41)
Albumin: 2.8 g/dL — ABNORMAL LOW (ref 3.5–5.0)
Alkaline Phosphatase: 70 U/L (ref 38–126)
Anion gap: 6 (ref 5–15)
BUN: 36 mg/dL — ABNORMAL HIGH (ref 8–23)
CO2: 25 mmol/L (ref 22–32)
Calcium: 8.5 mg/dL — ABNORMAL LOW (ref 8.9–10.3)
Chloride: 105 mmol/L (ref 98–111)
Creatinine, Ser: 0.91 mg/dL (ref 0.61–1.24)
GFR, Estimated: >60 mL/min (ref >60)
Glucose, Bld: 100 mg/dL — ABNORMAL HIGH (ref 70–99)
Potassium: 4 mmol/L (ref 3.5–5.1)
Sodium: 136 mmol/L (ref 135–145)
Total Bilirubin: 0.6 mg/dL (ref 0.3–1.2)
Total Protein: 6 g/dL — ABNORMAL LOW (ref 6.5–8.1)

## 2021-02-07 LAB — BLOOD GAS, VENOUS
Acid-Base Excess: 2.5 mmol/L — ABNORMAL HIGH (ref 0.0–2.0)
Bicarbonate: 26 mmol/L (ref 20.0–28.0)
O2 Saturation: 87.1 %
Patient temperature: 99.8
pCO2, Ven: 39 mmHg — ABNORMAL LOW (ref 44.0–60.0)
pH, Ven: 7.442 — ABNORMAL HIGH (ref 7.250–7.430)
pO2, Ven: 57.4 mmHg — ABNORMAL HIGH (ref 32.0–45.0)

## 2021-02-07 LAB — PREALBUMIN: Prealbumin: 9 mg/dL — ABNORMAL LOW (ref 18–38)

## 2021-02-07 LAB — STREP PNEUMONIAE URINARY ANTIGEN: Strep Pneumo Urinary Antigen: NEGATIVE

## 2021-02-07 LAB — PHOSPHORUS: Phosphorus: 2.8 mg/dL (ref 2.5–4.6)

## 2021-02-07 LAB — AMMONIA: Ammonia: 19 umol/L (ref 9–35)

## 2021-02-07 LAB — PROCALCITONIN: Procalcitonin: 0.1 ng/mL

## 2021-02-07 LAB — MAGNESIUM: Magnesium: 2.1 mg/dL (ref 1.7–2.4)

## 2021-02-07 MED ORDER — TIMOLOL MALEATE 0.5 % OP SOLN
1.0000 [drp] | Freq: Two times a day (BID) | OPHTHALMIC | Status: DC
Start: 2021-02-07 — End: 2021-02-14
  Administered 2021-02-07 – 2021-02-13 (×13): 1 [drp] via OPHTHALMIC
  Filled 2021-02-07 (×2): qty 5

## 2021-02-07 MED ORDER — SODIUM CHLORIDE 0.9 % IV SOLN
3.0000 g | Freq: Four times a day (QID) | INTRAVENOUS | Status: DC
Start: 2021-02-07 — End: 2021-02-11
  Administered 2021-02-07 – 2021-02-11 (×14): 3 g via INTRAVENOUS
  Filled 2021-02-07 (×16): qty 8

## 2021-02-07 NOTE — ED Notes (Signed)
Pt had a BM, changed w/ NT. Total assistance needed, pt not able to assist w/ rolling. Sacral area w/ breakdown, cleaned w/ cleanser spray and sacral dressing applied. Other dressing to R hip intact, not soiled, will leave in place as well as heel dressing.

## 2021-02-07 NOTE — ED Notes (Signed)
Admitting MD at bedside.

## 2021-02-07 NOTE — Evaluation (Signed)
Physical Therapy Evaluation Patient Details Name: Chad Russell MRN: [id number] DOB: 12/11/1926 Today's Date: 02/07/2021  History of Present Illness  Pt is 87 yo male who presented to [facility] on 02/06/21 with generalized weakness and CAP.  Pt with hx including but not limited to  persistent atrial fibrillation chronically anticoagulated on Xarelto, chronic bradycardia, Parkinson's disease complicated by dementia, hypertension, Bil hip hemiarthroplasty.  Clinical Impression  Pt admitted with above diagnosis. Wife providing hx and exact timeline of events unclear, but pt with general decline in mobility over last month.  Prior to that he was ambulatory with rollator, did need assist with ADLs. Today, pt lethargic but arousable.  He required max-total A x 2 for transfers and sit to stands.  Unable to take any steps.  Pt was able to follow basic commands.  Did have tendency to posterior lean initially but could correct in sitting with cues and assist.  Pt currently with functional limitations due to the deficits listed below (see PT Problem List). Pt will benefit from skilled PT to increase their independence and safety with mobility to allow discharge to the venue listed below.          Recommendations for follow up therapy are one component of a multi-disciplinary discharge planning process, led by the attending physician.  Recommendations may be updated based on patient status, additional functional criteria and insurance authorization.  Follow Up Recommendations Skilled nursing-short term rehab (<3 hours/day)    Assistance Recommended at Discharge Frequent or constant Supervision/Assistance  Functional Status Assessment Patient has had a recent decline in their functional status and demonstrates the ability to make significant improvements in function in a reasonable and predictable amount of time.  Equipment Recommendations  Other (comment) (further assessment post acute)    Recommendations for  Other Services       Precautions / Restrictions Precautions Precautions: Fall Precaution Comments: pressure wound on sacrum, right ankle wound Restrictions Weight Bearing Restrictions: No      Mobility  Bed Mobility Overal bed mobility: Needs Assistance Bed Mobility: Supine to Sit;Sit to Supine;Rolling Rolling: Max assist;+2 for physical assistance   Supine to sit: Total assist;+2 for physical assistance Sit to supine: Total assist;+2 for physical assistance        Transfers Overall transfer level: Needs assistance   Transfers: Sit to/from Stand Sit to Stand: Max assist;+2 physical assistance           General transfer comment: Max assist x 2 to stand twice at edge of bed. Patient able to bear weight through legs without buckling but knees flexed. Unable to take a step.    Ambulation/Gait               General Gait Details: unable  Stairs            Wheelchair Mobility    Modified Rankin (Stroke Patients Only)       Balance Overall balance assessment: Needs assistance Sitting-balance support: No upper extremity supported;Feet supported Sitting balance-Leahy Scale: Poor Sitting balance - Comments: reliant on external assist Postural control: Posterior lean Standing balance support: Bilateral upper extremity supported Standing balance-Leahy Scale: Zero Standing balance comment: Requiring max x 2 for 10-15 seconds with knees flexed                             Pertinent Vitals/Pain Pain Assessment: Faces Faces Pain Scale: Hurts even more Pain Location: sacrum Pain Descriptors / Indicators: Burning;Grimacing Pain Intervention(s):  Limited activity within patient's tolerance    Home Living Family/patient expects to be discharged to:: Skilled nursing facility Living Arrangements: Spouse/significant other               Home Equipment: Rollator (4 wheels) Additional Comments: patient does not have a shower chair.; wife  providing hx    Prior Function Prior Level of Function : Needs assist             Mobility Comments: Patient has walked once with PT last week but has mostly been bed bound for last last month after a fall at home in which he then reported right sided rib pain. Prior to that he walked with rollator. ADLs Comments: Has needed significant assistance for ADLs in last month. Has been unable to feed himself due to inability to hold utensils.     Hand Dominance   Dominant Hand: Right    Extremity/Trunk Assessment   Upper Extremity Assessment Upper Extremity Assessment: Defer to OT evaluation RUE Deficits / Details: 3-/5 shoulder, 5/5 otherwise; some pain in right shoulder with active assist ROM RUE Sensation: WNL RUE Coordination: decreased fine motor;decreased gross motor LUE Deficits / Details: Grossly functional ROM, 5/5 strength LUE Sensation: WNL LUE Coordination: decreased fine motor;decreased gross motor    Lower Extremity Assessment Lower Extremity Assessment: LLE deficits/detail;RLE deficits/detail;Difficult to assess due to impaired cognition RLE Deficits / Details: Pt with difficulty with MMT commands.  ROM: stiff but grossly WFL; MMT: 2-3/5 throughout LLE Deficits / Details: Pt with difficulty with MMT commands.  ROM: stiff but grossly WFL; MMT: 2-3/5 throughout    Cervical / Trunk Assessment Cervical / Trunk Assessment: Kyphotic  Communication   Communication: HOH  Cognition Arousal/Alertness: Lethargic Behavior During Therapy: WFL for tasks assessed/performed Overall Cognitive Status: Within Functional Limits for tasks assessed                                 General Comments: Alert to self, hospital and year. Was unsure of the month. can answer questions. Was able to follow commands to use flutter.        General Comments General comments (skin integrity, edema, etc.): Pt's HR 30's-40's baseline    Exercises     Assessment/Plan    PT  Assessment Patient needs continued PT services  PT Problem List Decreased strength;Decreased mobility;Decreased safety awareness;Impaired tone;Decreased range of motion;Decreased coordination;Decreased activity tolerance;Decreased cognition;Cardiopulmonary status limiting activity;Decreased balance;Decreased knowledge of use of DME;Pain       PT Treatment Interventions DME instruction;Therapeutic exercise;Gait training;Balance training;Stair training;Neuromuscular re-education;Functional mobility training;Therapeutic activities;Patient/family education    PT Goals (Current goals can be found in the Care Plan section)  Acute Rehab PT Goals Patient Stated Goal: wife agreeable to rehab PT Goal Formulation: With patient/family Time For Goal Achievement: 02/21/21 Potential to Achieve Goals: Fair    Frequency Min 2X/week   Barriers to discharge        Co-evaluation               AM-PAC PT "6 Clicks" Mobility  Outcome Measure Help needed turning from your back to your side while in a flat bed without using bedrails?: Total Help needed moving from lying on your back to sitting on the side of a flat bed without using bedrails?: Total Help needed moving to and from a bed to a chair (including a wheelchair)?: Total Help needed standing up from a chair using your arms (e.g.,  wheelchair or bedside chair)?: Total Help needed to walk in hospital room?: Total Help needed climbing 3-5 steps with a railing? : Total 6 Click Score: 6    End of Session Equipment Utilized During Treatment: Gait belt Activity Tolerance: Patient limited by fatigue Patient left: in bed;with call bell/phone within reach (In ED on stretcher; Pillow to offload under R hip, heels floated) Nurse Communication: Mobility status;Other (comment) (needs new dressing sacrum, had BM, needs air mattress) PT Visit Diagnosis: Other abnormalities of gait and mobility (R26.89);Muscle weakness (generalized) (M62.81)    Time:  6282-4175 PT Time Calculation (min) (ACUTE ONLY): 35 min   Charges:   PT Evaluation $PT Eval Moderate Complexity: 1 Melina Schools, PT Acute Rehab Services Pager [phone] [facility] [place] 02/07/2021, 3:12 PM

## 2021-02-07 NOTE — Progress Notes (Signed)
Pt unable to utilize Flutter valve at this time due to AMS. Flutter valve remains at bedside.

## 2021-02-07 NOTE — Evaluation (Signed)
Occupational Therapy Evaluation Patient Details Name: Chad Russell MRN: [id number] DOB: 1926-03-26 Today's Date: 02/07/2021   History of Present Illness Chad Russell is a 87 y.o. male with medical history significant for persistent atrial fibrillation chronically anticoagulated on Xarelto, chronic bradycardia, Parkinson's disease complicated by dementia, hypertension, who is admitted to [facility] on 02/06/2021 with community-acquired pneumonia   Clinical Impression   [name] is a 87 year old man who presents with generalized weakness, decreased activity tolerance, impaired balance, increased rigidity and decreased initiation and coordination secondary to Parkinson's disease. A month ago he was able to walk with his walker and has had a rapid decline since a fall. Unsure of his abilities to assist with ADLs.  Wife reports he "can't see" and he was unable to hold utensils. His upper body strength tested strong (except right shoulder) but overall he presents as weak.  Patient required total assist or bed mobility, max assist x 2 to stand at side of bed x 2. He is currently total assist for ADLs at bed level. Therapist washed patient's face and cleaned his eyes to improve his alertness. He keeps his eyes closed but answer questions appropriately. He knows the year and the hospital. He wa able to follow instructions to blow into flutter valve. Patient will benefit from skilled OT services while in hospital to improve deficits and learn compensatory strategies as needed in order to improve functional abilities and reduce caregiver burden.       Recommendations for follow up therapy are one component of a multi-disciplinary discharge planning process, led by the attending physician.  Recommendations may be updated based on patient status, additional functional criteria and insurance authorization.   Follow Up Recommendations  Skilled nursing-short term rehab (<3 hours/day)     Assistance Recommended at Discharge Frequent or constant Supervision/Assistance  Functional Status Assessment  Patient has had a recent decline in their functional status and demonstrates the ability to make significant improvements in function in a reasonable and predictable amount of time.  Equipment Recommendations  None recommended by OT    Recommendations for Other Services       Precautions / Restrictions Precautions Precautions: Fall Precaution Comments: pressure wound on sacrum, right ankle wound Restrictions Weight Bearing Restrictions: No      Mobility Bed Mobility Overal bed mobility: Needs Assistance Bed Mobility: Supine to Sit;Sit to Supine     Supine to sit: Total assist;+2 for physical assistance Sit to supine: Total assist;+2 for physical assistance        Transfers Overall transfer level: Needs assistance   Transfers: Sit to/from Stand Sit to Stand: Max assist;+2 physical assistance           General transfer comment: Max assist x 2 to stand twice at edge of bed. Patient able to bear weight through legs without buckling. Unable to take a step.      Balance Overall balance assessment: Needs assistance Sitting-balance support: No upper extremity supported;Feet supported Sitting balance-Leahy Scale: Poor Sitting balance - Comments: reliant on external assist     Standing balance-Leahy Scale: Zero                             ADL either performed or assessed with clinical judgement   ADL Overall ADL's : Needs assistance/impaired Eating/Feeding: NPO   Grooming: Bed level;Total assistance       Lower Body Bathing: Bed level;Total assistance   Upper Body  Dressing : Total assistance;Bed level   Lower Body Dressing: Total assistance;Bed level   Toilet Transfer: Total assistance   Toileting- Clothing Manipulation and Hygiene: Total assistance;Bed level       Functional mobility during ADLs: Total assistance;+2 for  physical assistance       Vision Baseline Vision/History: 2 Legally blind Additional Comments: Wife reports he can't see.     Perception     Praxis      Pertinent Vitals/Pain Pain Assessment: Faces Faces Pain Scale: Hurts even more Pain Location: sacrum Pain Descriptors / Indicators: Burning;Grimacing Pain Intervention(s): Limited activity within patient's tolerance     Hand Dominance Right   Extremity/Trunk Assessment Upper Extremity Assessment Upper Extremity Assessment: LUE deficits/detail;RUE deficits/detail RUE Deficits / Details: 3-/5 shoulder, 5/5 otherwise; some pain in right shoulder with active assist ROM RUE Sensation: WNL RUE Coordination: decreased fine motor;decreased gross motor LUE Deficits / Details: Grossly functional ROM, 5/5 strength LUE Sensation: WNL LUE Coordination: decreased fine motor;decreased gross motor   Lower Extremity Assessment Lower Extremity Assessment: Defer to PT evaluation   Cervical / Trunk Assessment Cervical / Trunk Assessment: Normal   Communication Communication Communication: HOH   Cognition Arousal/Alertness: Lethargic Behavior During Therapy: WFL for tasks assessed/performed Overall Cognitive Status: Within Functional Limits for tasks assessed                                 General Comments: Alert to self, hospital and year. Was unsure of the month. can answer questions. Was able to follow commands to use flutter.     General Comments       Exercises     Shoulder Instructions      Home Living Family/patient expects to be discharged to:: Skilled nursing facility Living Arrangements: Spouse/significant other                                      Prior Functioning/Environment Prior Level of Function : Needs assist             Mobility Comments: Patient has walked once with PT last week but has mostly been bed bound for last last month after a fall at home in which he then  reported right sided rib pain. Prior to that he walked with walker. ADLs Comments: Has needed significant assistance for ADLs in last month. Has been unable to feed himself due to inability to hold utensils.        OT Problem List: Decreased strength;Decreased activity tolerance;Impaired balance (sitting and/or standing);Pain;Decreased cognition;Decreased safety awareness;Decreased knowledge of use of DME or AE;Impaired UE functional use      OT Treatment/Interventions: Self-care/ADL training;Therapeutic exercise;DME and/or AE instruction;Balance training;Patient/family education;Therapeutic activities    OT Goals(Current goals can be found in the care plan section) Acute Rehab OT Goals Patient Stated Goal: to go to rehab and walk again OT Goal Formulation: With family Time For Goal Achievement: 02/21/21 Potential to Achieve Goals: Fair  OT Frequency: Min 2X/week   Barriers to D/C:            Co-evaluation              AM-PAC OT "6 Clicks" Daily Activity     Outcome Measure Help from another person eating meals?: Total Help from another person taking care of personal grooming?: Total Help from another person toileting, which includes  using toliet, bedpan, or urinal?: Total Help from another person bathing (including washing, rinsing, drying)?: Total Help from another person to put on and taking off regular upper body clothing?: Total Help from another person to put on and taking off regular lower body clothing?: Total 6 Click Score: 6   End of Session Equipment Utilized During Treatment: Gait belt Nurse Communication: Mobility status  Activity Tolerance: Patient tolerated treatment well Patient left: in bed;with call bell/phone within reach;with family/visitor present  OT Visit Diagnosis: Unsteadiness on feet (R26.81);Other abnormalities of gait and mobility (R26.89);Pain;Muscle weakness (generalized) (M62.81)                Time: 4628-6381 OT Time Calculation (min):  33 min Charges:  OT General Charges $OT Visit: 1 Visit OT Evaluation $OT Eval Moderate Complexity: 1 Mod  Mary Hockey, OTR/L [place]  Office [phone] Pager: [place] 02/07/2021, 2:16 PM

## 2021-02-07 NOTE — Progress Notes (Signed)
Pharmacy Antibiotic Note  Chad Russell is a 87 y.o. male admitted on 02/06/2021 with  aspiration PNA .  Pharmacy has been consulted for ampicillin/sulbactam dosing.  Today, 02/07/21 -WBC WNL -Renal function WNL -Afebrile  Plan: Ampicillin/sulbactam 3 g IV q6h Follow renal function   Height: 6' (182.9 cm) Weight: 77 kg (169 lb 12.1 oz) IBW/kg (Calculated) : 77.6  Temp (24hrs), Avg:99.8 F (37.7 C), Min:99.8 F (37.7 C), Max:99.8 F (37.7 C)  Recent Labs  Lab 02/06/21 1718 02/06/21 1932 02/07/21 0447  WBC 7.3  --  7.4  CREATININE 1.18  --  0.91  LATICACIDVEN 1.8 1.3  --     Estimated Creatinine Clearance: 56.4 mL/min (by C-G formula based on SCr of 0.91 mg/dL).    Allergies  Allergen Reactions   Doxycycline Other (See Comments)    Upset stomach   Keflex [Cephalexin] Other (See Comments)    Abdominal discomfort 04-19-17 Pt has tolerated orally    Thank you for allowing pharmacy to be a part of this patients care.  Lenis Noon, PharmD 02/07/2021 4:00 PM

## 2021-02-07 NOTE — Progress Notes (Signed)
PROGRESS NOTE    Chad Russell  AOZ:[id number] DOB: May 26, 1926 DOA: 02/06/2021 PCP: [name], MD    Brief Narrative:  Chad Russell was admitted to the hospital with the working diagnosis of community acquired pneumonia   87 yo male with the past medical history of persistent atrial fibrillation, bradycardia, parkinson's disease, dementia, and HTN who presented with generalized weakness. His wife reported patient having progressive weakness for the last few weeks, associated with decrease ambulatory state and somnolence. On his initial physical examination his blood pressure was 137/64, HR 40 to 46, RR 17 to 24 and oxygen saturation 100% on room air. Heart S1 and S2 bradycardic, lungs with no wheezing or rhonchi, abdomen soft and non tender, no lower extremity edema. Positive dementia, poorly responsive.   Sodium 137, potassium 4.3, chloride 105, bicarb 26, glucose 132, BUN 38, creatinine 1.18, CK6 119, white count 7.3, hemoglobin 12.7, hematocrit 39.9, platelets 225. SARS COVID-19 negative.  Urinalysis specific gravity > 1.046, 30 protein, 11-20 white cells,> 50 red cells.  CT pelvis with some subcutaneous edema.  Chest radiograph with right base pleural calcifications, right lower lobe interstitial infiltrate, left upper lobe interstitial infiltrate.  EKG 39 bpm, normal axis, right bundle branch block, atrial flutter rhythm with PVC, no significant St segment or T wave changes.   Patient was placed on broad spectrum IV antibiotic therapy.   Assessment & Plan:   Principal Problem:   CAP (community acquired pneumonia) Active Problems:   HTN (hypertension)   Atrial fibrillation (HCC)   Generalized weakness   Acute metabolic encephalopathy   Dehydration   Acute prerenal azotemia   Parkinson disease (HCC)   Protein calorie malnutrition (HCC)   Right lower lobe and left upper lobe community acquired pneumonia, aspiration pneumonia.  Patient with advance Parkinson's  disease with rapid decline in physical functional capacity. At home needs a modified diet and aid in feedings.   Plan to continue antibiotic therapy with Unasyn Continue aspiration precautions, dysphagia 1 diet and follow with speech and swallow recommendations.  Continue oxymetry monitoring and supplemental 02 per [place] to keep 02 saturation 92% or greater.   2. Parkinson's disease, with dementia, ambulatory dysfunction and multiple pressure sores, bilateral buttocks stage 2 present on admission.  Acute metabolic encephalopathy  Patient had decreased vision due to macular degeneration, he responds to simple questions. Will advance diet with aspiration precautions  Continue medical therapy with sinemet Follow wound care recommendations,  Add air mattress.  Follow with PT and OT recommendations   3. HTN. Continue blood pressure control   4. Atrial flutter with bradycardia (chronic). Continue blood pressure monitoring. Has has been hemodynamically stable.  Anticoagulation with rivaroxaban.   5. Protein calorie protein malnutrition. Consult nutrition for recommendations   Patient continue to be at high risk for  worsening pneumonia   Status is: Inpatient  Remains inpatient appropriate because: IV antibiotic therapy    DVT prophylaxis:  Enoxaparin   Code Status:    full  Family Communication:   I spoke with patient's wife at the bedside, we talked in detail about patient's condition, plan of care and prognosis and all questions were addressed.      Antimicrobials:  Unasyn     Subjective: Patient with eyes closed but responds to simple questions, most information from his wife at the bedside   Objective: Vitals:   02/07/21 1100 02/07/21 1130 02/07/21 1200 02/07/21 1230  BP: 135/66 (!) 155/61 (!) 153/67 (!) 152/61  Pulse: (!) 36 Marland Kitchen)  33 (!) 32 (!) 32  Resp: 20 (!) 21 11 16   Temp:      TempSrc:      SpO2: 96% 93% 97% 93%  Weight:      Height:        Intake/Output  Summary (Last 24 hours) at 02/07/2021 1403 Last data filed at 02/07/2021 1151 Gross per 24 hour  Intake 1384.3 ml  Output --  Net 1384.3 ml   Filed Weights   02/06/21 1707  Weight: 77 kg    Examination:   General: deconditioned and ill looking appearing Neurology: Awake and alert, non focal  E ENT: mild pallor, no icterus, oral mucosa moist Cardiovascular: No JVD. S1-S2 present, rhythmic, no gallops, rubs, or murmurs. No lower extremity edema. Pulmonary: positive breath sounds bilaterally, with no wheezing, rhonchi or rales. Gastrointestinal. Abdomen soft and non tender Skin. No rashes Musculoskeletal: no joint deformities     Data Reviewed: I have personally reviewed following labs and imaging studies  CBC: Recent Labs  Lab 02/06/21 1718 02/07/21 0447  WBC 7.3 7.4  NEUTROABS 4.6 4.3  HGB 12.7* 11.9*  HCT 39.9 37.3*  MCV 96.8 95.6  PLT 225 353   Basic Metabolic Panel: Recent Labs  Lab 02/06/21 1718 02/07/21 0447  NA 137 136  K 4.3 4.0  CL 105 105  CO2 26 25  GLUCOSE 132* 100*  BUN 38* 36*  CREATININE 1.18 0.91  CALCIUM 8.9 8.5*  MG 2.4 2.1  PHOS  --  2.8   GFR: Estimated Creatinine Clearance: 56.4 mL/min (by C-G formula based on SCr of 0.91 mg/dL). Liver Function Tests: Recent Labs  Lab 02/06/21 1718 02/07/21 0447  AST 37 40  ALT 17 23  ALKPHOS 82 70  BILITOT 0.5 0.6  PROT 6.8 6.0*  ALBUMIN 3.0* 2.8*   No results for input(s): LIPASE, AMYLASE in the last 168 hours. Recent Labs  Lab 02/07/21 0447  AMMONIA 19   Coagulation Profile: Recent Labs  Lab 02/06/21 1718  INR 1.9*   Cardiac Enzymes: Recent Labs  Lab 02/06/21 1718  CKTOTAL 619*   BNP (last 3 results) No results for input(s): PROBNP in the last 8760 hours. HbA1C: No results for input(s): HGBA1C in the last 72 hours. CBG: No results for input(s): GLUCAP in the last 168 hours. Lipid Profile: No results for input(s): CHOL, HDL, LDLCALC, TRIG, CHOLHDL, LDLDIRECT in the  last 72 hours. Thyroid Function Tests: Recent Labs    02/06/21 1718  TSH 4.139   Anemia Panel: No results for input(s): VITAMINB12, FOLATE, FERRITIN, TIBC, IRON, RETICCTPCT in the last 72 hours.    Radiology Studies: I have reviewed all of the imaging during this hospital visit personally     Scheduled Meds:  carbidopa-levodopa  1.5 tablet Oral BID PC   rivaroxaban  20 mg Oral Q supper   Continuous Infusions:  azithromycin     cefTRIAXone (ROCEPHIN)  IV       LOS: 1 day        Ruthann Angulo Gerome Apley, MD

## 2021-02-08 LAB — URINE CULTURE

## 2021-02-08 LAB — CBC
HCT: 38.9 % — ABNORMAL LOW (ref 39.0–52.0)
Hemoglobin: 12.5 g/dL — ABNORMAL LOW (ref 13.0–17.0)
MCH: 30.4 pg (ref 26.0–34.0)
MCHC: 32.1 g/dL (ref 30.0–36.0)
MCV: 94.6 fL (ref 80.0–100.0)
Platelets: 237 10*3/uL (ref 150–400)
RBC: 4.11 MIL/uL — ABNORMAL LOW (ref 4.22–5.81)
RDW: 14.7 % (ref 11.5–15.5)
WBC: 10.1 10*3/uL (ref 4.0–10.5)
nRBC: 0 % (ref 0.0–0.2)

## 2021-02-08 LAB — BASIC METABOLIC PANEL
Anion gap: 5 (ref 5–15)
BUN: 29 mg/dL — ABNORMAL HIGH (ref 8–23)
CO2: 24 mmol/L (ref 22–32)
Calcium: 8.3 mg/dL — ABNORMAL LOW (ref 8.9–10.3)
Chloride: 108 mmol/L (ref 98–111)
Creatinine, Ser: 0.9 mg/dL (ref 0.61–1.24)
GFR, Estimated: >60 mL/min (ref >60)
Glucose, Bld: 103 mg/dL — ABNORMAL HIGH (ref 70–99)
Potassium: 4 mmol/L (ref 3.5–5.1)
Sodium: 137 mmol/L (ref 135–145)

## 2021-02-08 MED ORDER — COLLAGENASE 250 UNIT/GM EX OINT
TOPICAL_OINTMENT | Freq: Every day | CUTANEOUS | Status: DC
  Administered 2021-02-09 – 2021-02-10 (×2): 1 via TOPICAL
  Filled 2021-02-08: qty 30

## 2021-02-08 NOTE — ED Notes (Signed)
ED TO INPATIENT HANDOFF REPORT  Name/Age/Gender Chad Russell 87 y.o. male  Code Status    Code Status Orders  (From admission, onward)           Start     Ordered   02/06/21 2007  Full code  Continuous        02/06/21 2006           Code Status History     Date Active Date Inactive Code Status Order ID Comments User Context   10/14/2020 2134 10/21/2020 1634 Full Code [id number]  Shela Leff, MD ED   12/14/2018 1947 12/26/2018 2232 Full Code [id number]  Orene Desanctis, DO ED   04/19/2017 2321 04/23/2017 0035 Full Code [id number]  Marchia Bond, MD Inpatient   01/17/2017 1142 01/17/2017 1904 Full Code [id number]  Angelia Mould, MD Inpatient   09/07/2016 1207 09/09/2016 1657 Full Code [id number]  Irine Seal, MD Inpatient   04/26/2012 2134 04/29/2012 1720 Full Code [id number]  Gerda Diss, DO Inpatient   03/15/2011 2127 03/17/2011 1522 Full Code [id number]  Bamimore, Earney Navy, MD ED       Home/SNF/Other Home  Chief Complaint CAP (community acquired pneumonia) [J18.9]  Level of Care/Admitting Diagnosis ED Disposition     ED Disposition  Admit   Condition  --   Comment  Hospital Area: [facility] [[id number]]  Level of Care: Telemetry [5]  Admit to tele based on following criteria: Monitor for Ischemic changes  May admit patient to [facility] or [facility] if equivalent level of care is available:: No  Covid Evaluation: Asymptomatic Screening Protocol (No Symptoms)  Diagnosis: CAP (community acquired pneumonia) [119417]  Admitting Physician: Rhetta Mura [[id number]]  Attending Physician: Rhetta Mura [[id number]]  Estimated length of stay: past midnight tomorrow  Certification:: I certify this patient will need inpatient services for at least 2 midnights          Medical History Past Medical History:  Diagnosis Date   Arthritis    Atrial fibrillation ([place])    Balance problem 10/31/2013   BPH (benign prostatic  hyperplasia) 2014   Urology Dr [name] with [facility] [phone]    Bradycardia    a. baseline HR in the 30's. Asymptomatic - no history of PPM placement.    CAD (coronary artery disease)    LHC 03/16/11: Mid LAD 10-20%, proximal circumflex 10%, mid RCA 20%, EF 55%.   Cataract    Complete heart block (H[facility])    Fracture of femoral neck, right ([place]) 04/19/2017   GERD (gastroesophageal reflux disease)    takes occasional  zantac   Hx of echocardiogram    Echo 02/2011: EF 65-70%, normal wall motion, MAC, mild MR, mild LAE, mild RVE   Hypertension    OSA (obstructive sleep apnea)    per patient went to have sleep study done about 2 years ago, bu they never F/U with him about results; but wife reports he still has periods of apnea at night    Parkinson disease ([place])    Talar fracture    casted no surg   UTI (urinary tract infection) 10/2020    Allergies Allergies  Allergen Reactions   Doxycycline Other (See Comments)    Upset stomach   Keflex [Cephalexin] Other (See Comments)    Abdominal discomfort 04-19-17 Pt has tolerated orally    IV Location/Drains/Wounds Patient Lines/Drains/Airways Status     Active Line/Drains/Airways     Name Placement date Placement time Site  Days   Peripheral IV 02/06/21 18 G Anterior;Right Forearm 02/06/21  1736  Forearm  2   External Urinary Catheter 10/17/20  0700  --  114            Labs/Imaging Results for orders placed or performed during the hospital encounter of 02/06/21 (from the past 48 hour(s))  Blood Culture (routine x 2)     Status: None (Preliminary result)   Collection Time: 02/06/21  5:01 PM   Specimen: BLOOD  Result Value Ref Range   Specimen Description      BLOOD BLOOD RIGHT FOREARM Performed at [facility] [address], [place], [place] [address]    Special Requests      BOTTLES DRAWN AEROBIC AND ANAEROBIC Blood Culture results may not be optimal due to an inadequate volume of blood received in culture  bottles Performed at [facility], [place] [address], [place], [place] [address]    Culture      NO GROWTH < 24 HOURS Performed at [facility] [address] [place], [place] [address]    Report Status PENDING   Blood Culture (routine x 2)     Status: None (Preliminary result)   Collection Time: 02/06/21  5:06 PM   Specimen: BLOOD  Result Value Ref Range   Specimen Description      BLOOD BLOOD RIGHT FOREARM Performed at [facility] [address] [place], [place] [address]    Special Requests      BOTTLES DRAWN AEROBIC AND ANAEROBIC Blood Culture adequate volume Performed at [facility] [address] [place], [place] [address]    Culture      NO GROWTH < 24 HOURS Performed at [facility] [address] [place], [place] [address]    Report Status PENDING   Lactic acid, plasma     Status: None   Collection Time: 02/06/21  5:18 PM  Result Value Ref Range   Lactic Acid, Venous 1.8 0.5 - 1.9 mmol/L    Comment: Performed at [facility], [place] [address] [place], [place] [address]  Comprehensive metabolic panel     Status: Abnormal   Collection Time: 02/06/21  5:18 PM  Result Value Ref Range   Sodium 137 135 - 145 mmol/L   Potassium 4.3 3.5 - 5.1 mmol/L   Chloride 105 98 - 111 mmol/L   CO2 26 22 - 32 mmol/L   Glucose, Bld 132 (H) 70 - 99 mg/dL    Comment: Glucose reference range applies only to samples taken after fasting for at least 8 hours.   BUN 38 (H) 8 - 23 mg/dL   Creatinine, Ser 1.18 0.61 - 1.24 mg/dL   Calcium 8.9 8.9 - 10.3 mg/dL   Total Protein 6.8 6.5 - 8.1 g/dL   Albumin 3.0 (L) 3.5 - 5.0 g/dL   AST 37 15 - 41 U/L   ALT 17 0 - 44 U/L   Alkaline Phosphatase 82 38 - 126 U/L   Total Bilirubin 0.5 0.3 - 1.2 mg/dL   GFR, Estimated 58 (L) >60 mL/min    Comment: (NOTE) Calculated using the CKD-EPI Creatinine Equation (2021)    Anion gap 6 5 - 15    Comment: Performed  at [facility], [place] [address], [place], [place] [address]  CBC WITH DIFFERENTIAL     Status: Abnormal   Collection Time: 02/06/21  5:18 PM  Result Value Ref  Range   WBC 7.3 4.0 - 10.5 K/uL   RBC 4.12 (L) 4.22 - 5.81 MIL/uL   Hemoglobin 12.7 (L) 13.0 - 17.0 g/dL   HCT 39.9 39.0 - 52.0 %   MCV 96.8 80.0 - 100.0 fL   MCH 30.8 26.0 - 34.0 pg   MCHC 31.8 30.0 - 36.0 g/dL   RDW 15.0 11.5 - 15.5 %   Platelets 225 150 - 400 K/uL   nRBC 0.0 0.0 - 0.2 %   Neutrophils Relative % 64 %   Neutro Abs 4.6 1.7 - 7.7 K/uL   Lymphocytes Relative 23 %   Lymphs Abs 1.7 0.7 - 4.0 K/uL   Monocytes Relative 12 %   Monocytes Absolute 0.9 0.1 - 1.0 K/uL   Eosinophils Relative 1 %   Eosinophils Absolute 0.1 0.0 - 0.5 K/uL   Basophils Relative 0 %   Basophils Absolute 0.0 0.0 - 0.1 K/uL   Immature Granulocytes 0 %   Abs Immature Granulocytes 0.03 0.00 - 0.07 K/uL    Comment: Performed at [facility], [place] [address], [place], [place] [address]  Protime-INR     Status: Abnormal   Collection Time: 02/06/21  5:18 PM  Result Value Ref Range   Prothrombin Time 21.3 (H) 11.4 - 15.2 seconds   INR 1.9 (H) 0.8 - 1.2    Comment: (NOTE) INR goal varies based on device and disease states. Performed at [facility], [place] [address] [place], [place] [address]   APTT     Status: Abnormal   Collection Time: 02/06/21  5:18 PM  Result Value Ref Range   aPTT 41 (H) 24 - 36 seconds    Comment:        IF BASELINE aPTT IS ELEVATED, SUGGEST PATIENT RISK ASSESSMENT BE USED TO DETERMINE APPROPRIATE ANTICOAGULANT THERAPY. Performed at [facility], [place] [address] [place], [place] [address]   TSH     Status: None   Collection Time: 02/06/21  5:18 PM  Result Value Ref Range   TSH 4.139 0.350 - 4.500 uIU/mL    Comment: Performed by a 3rd Generation assay with a functional sensitivity of <=0.01 uIU/mL. Performed at [facility], [place] [address] [place], [place] [address]   Procalcitonin - Baseline     Status: None   Collection Time: 02/06/21  5:18 PM  Result Value Ref Range   Procalcitonin 0.10 ng/mL    Comment:        Interpretation: PCT (Procalcitonin) <= 0.5 ng/mL: Systemic infection (sepsis) is not likely. Local bacterial infection is possible. (NOTE)       Sepsis PCT Algorithm           Lower Respiratory Tract                                      Infection PCT Algorithm    ----------------------------     ----------------------------         PCT < 0.25 ng/mL                PCT < 0.10 ng/mL          Strongly encourage             Strongly discourage   discontinuation of antibiotics    initiation of antibiotics    ----------------------------     -----------------------------  PCT 0.25 - 0.50 ng/mL            PCT 0.10 - 0.25 ng/mL               OR       >80% decrease in PCT            Discourage initiation of                                            antibiotics      Encourage discontinuation           of antibiotics    ----------------------------     -----------------------------         PCT >= 0.50 ng/mL              PCT 0.26 - 0.50 ng/mL               AND        <80% decrease in PCT             Encourage initiation of                                             antibiotics       Encourage continuation           of antibiotics    ----------------------------     -----------------------------        PCT >= 0.50 ng/mL                  PCT > 0.50 ng/mL               AND         increase in PCT                  Strongly encourage                                      initiation of antibiotics    Strongly encourage escalation           of antibiotics                                     -----------------------------                                           PCT <= 0.25 ng/mL                                                 OR                                        > 80% decrease in  PCT  Discontinue / Do not initiate                                             antibiotics  Performed at [facility] [address], [place], [place] [address]   Magnesium     Status: None   Collection Time: 02/06/21  5:18 PM  Result Value Ref Range   Magnesium 2.4 1.7 - 2.4 mg/dL    Comment: Performed at [facility], [place] [address] [place], [place] [address]  Brain natriuretic peptide     Status: Abnormal   Collection Time: 02/06/21  5:18 PM  Result Value Ref Range   B Natriuretic Peptide 332.0 (H) 0.0 - 100.0 pg/mL    Comment: Performed at [facility], [place] [address] [place], [place] [address]  CK     Status: Abnormal   Collection Time: 02/06/21  5:18 PM  Result Value Ref Range   Total CK 619 (H) 49 - 397 U/L    Comment: Performed at [facility], [place] [address], [place], [place] [address]  Prealbumin     Status: Abnormal   Collection Time: 02/06/21  5:20 PM  Result Value Ref Range   Prealbumin 9.0 (L) 18 - 38 mg/dL    Comment: Performed at [facility] [address], [place], [place] [address]  Lactic acid, plasma     Status: None   Collection Time: 02/06/21  7:32 PM  Result Value Ref Range   Lactic Acid, Venous 1.3 0.5 - 1.9 mmol/L    Comment: Performed at [facility], [place] [address], [place], [place] [address]  Urinalysis, Routine w reflex microscopic Urine, Clean Catch     Status: Abnormal   Collection Time: 02/06/21  8:07 PM  Result Value Ref Range   Color, Urine YELLOW YELLOW   APPearance HAZY (A) CLEAR   Specific Gravity, Urine >1.046 (H) 1.005 - 1.030   pH 5.0 5.0 - 8.0   Glucose, UA NEGATIVE NEGATIVE mg/dL   Hgb urine dipstick NEGATIVE NEGATIVE   Bilirubin Urine NEGATIVE NEGATIVE   Ketones, ur 5 (A) NEGATIVE mg/dL   Protein, ur 30 (A) NEGATIVE mg/dL   Nitrite NEGATIVE NEGATIVE   Leukocytes,Ua NEGATIVE NEGATIVE   RBC  / HPF >50 (H) 0 - 5 RBC/hpf   WBC, UA 11-20 0 - 5 WBC/hpf   Bacteria, UA FEW (A) NONE SEEN   Mucus PRESENT     Comment: Performed at [facility], [place] [address], [place], [place] [address]  Strep pneumoniae urinary antigen     Status: None   Collection Time: 02/06/21  8:07 PM  Result Value Ref Range   Strep Pneumo Urinary Antigen NEGATIVE NEGATIVE    Comment:        Infection due to S. pneumoniae cannot be absolutely ruled out since the antigen present may be below the detection limit of the test. Performed at [facility], [address], [place], [place] [address]   Resp Panel by RT-PCR (Flu A&B, Covid) Nasopharyngeal Swab     Status: None   Collection Time: 02/06/21  8:52 PM   Specimen: Nasopharyngeal Swab; Nasopharyngeal(NP) swabs in vial transport medium  Result Value Ref Range   SARS Coronavirus 2 by RT PCR NEGATIVE NEGATIVE    Comment: (NOTE) SARS-CoV-2 target nucleic acids  are NOT DETECTED.  The SARS-CoV-2 RNA is generally detectable in upper respiratory specimens during the acute phase of infection. The lowest concentration of SARS-CoV-2 viral copies this assay can detect is 138 copies/mL. A negative result does not preclude SARS-Cov-2 infection and should not be used as the sole basis for treatment or other patient management decisions. A negative result may occur with  improper specimen collection/handling, submission of specimen other than nasopharyngeal swab, presence of viral mutation(s) within the areas targeted by this assay, and inadequate number of viral copies(<138 copies/mL). A negative result must be combined with clinical observations, patient history, and epidemiological information. The expected result is Negative.  Fact Sheet for Patients:  EntrepreneurPulse.com.au  Fact Sheet for Healthcare Providers:  IncredibleEmployment.be  This test is no t yet approved or cleared by the [place] FDA and  has been authorized for detection and/or diagnosis of SARS-CoV-2 by FDA under an Emergency Use Authorization (EUA). This EUA will remain  in effect (meaning this test can be used) for the duration of the COVID-19 declaration under Section 564(b)(1) of the Act, 21 U.S.C.section 360bbb-3(b)(1), unless the authorization is terminated  or revoked sooner.       Influenza A by PCR NEGATIVE NEGATIVE   Influenza B by PCR NEGATIVE NEGATIVE    Comment: (NOTE) The Xpert Xpress SARS-CoV-2/FLU/RSV plus assay is intended as an aid in the diagnosis of influenza from Nasopharyngeal swab specimens and should not be used as a sole basis for treatment. Nasal washings and aspirates are unacceptable for Xpert Xpress SARS-CoV-2/FLU/RSV testing.  Fact Sheet for Patients: EntrepreneurPulse.com.au  Fact Sheet for Healthcare Providers: IncredibleEmployment.be  This test is not yet approved or cleared by the [place] FDA and has been authorized for detection and/or diagnosis of SARS-CoV-2 by FDA under an Emergency Use Authorization (EUA). This EUA will remain in effect (meaning this test can be used) for the duration of the COVID-19 declaration under Section 564(b)(1) of the Act, 21 U.S.C. section 360bbb-3(b)(1), unless the authorization is terminated or revoked.  Performed at [facility], [place] [address], [place], [place] [address]   Magnesium     Status: None   Collection Time: 02/07/21  4:47 AM  Result Value Ref Range   Magnesium 2.1 1.7 - 2.4 mg/dL    Comment: Performed at [facility], [place] [address] [place], [place] [address]  Phosphorus     Status: None   Collection Time: 02/07/21  4:47 AM  Result Value Ref Range   Phosphorus 2.8 2.5 - 4.6 mg/dL    Comment: Performed at [facility], [place] [address], [place], [place] [address]  Comprehensive metabolic panel     Status: Abnormal    Collection Time: 02/07/21  4:47 AM  Result Value Ref Range   Sodium 136 135 - 145 mmol/L   Potassium 4.0 3.5 - 5.1 mmol/L   Chloride 105 98 - 111 mmol/L   CO2 25 22 - 32 mmol/L   Glucose, Bld 100 (H) 70 - 99 mg/dL    Comment: Glucose reference range applies only to samples taken after fasting for at least 8 hours.   BUN 36 (H) 8 - 23 mg/dL   Creatinine, Ser 0.91 0.61 - 1.24 mg/dL   Calcium 8.5 (L) 8.9 - 10.3 mg/dL   Total Protein 6.0 (L) 6.5 - 8.1 g/dL   Albumin 2.8 (L) 3.5 - 5.0 g/dL   AST 40 15 - 41 U/L   ALT 23 0 -  44 U/L   Alkaline Phosphatase 70 38 - 126 U/L   Total Bilirubin 0.6 0.3 - 1.2 mg/dL   GFR, Estimated >60 >60 mL/min    Comment: (NOTE) Calculated using the CKD-EPI Creatinine Equation (2021)    Anion gap 6 5 - 15    Comment: Performed at [facility], [place] [address] [place], [place] [address]  CBC with Differential/Platelet     Status: Abnormal   Collection Time: 02/07/21  4:47 AM  Result Value Ref Range   WBC 7.4 4.0 - 10.5 K/uL   RBC 3.90 (L) 4.22 - 5.81 MIL/uL   Hemoglobin 11.9 (L) 13.0 - 17.0 g/dL   HCT 37.3 (L) 39.0 - 52.0 %   MCV 95.6 80.0 - 100.0 fL   MCH 30.5 26.0 - 34.0 pg   MCHC 31.9 30.0 - 36.0 g/dL   RDW 14.8 11.5 - 15.5 %   Platelets 212 150 - 400 K/uL   nRBC 0.0 0.0 - 0.2 %   Neutrophils Relative % 56 %   Neutro Abs 4.3 1.7 - 7.7 K/uL   Lymphocytes Relative 26 %   Lymphs Abs 1.9 0.7 - 4.0 K/uL   Monocytes Relative 15 %   Monocytes Absolute 1.1 (H) 0.1 - 1.0 K/uL   Eosinophils Relative 1 %   Eosinophils Absolute 0.1 0.0 - 0.5 K/uL   Basophils Relative 1 %   Basophils Absolute 0.0 0.0 - 0.1 K/uL   Immature Granulocytes 1 %   Abs Immature Granulocytes 0.04 0.00 - 0.07 K/uL    Comment: Performed at [facility], [place] [address] [place], [place] [address]  Ammonia     Status: None   Collection Time: 02/07/21  4:47 AM  Result Value Ref Range   Ammonia 19 9 - 35 umol/L    Comment: Performed at [facility], [place] [address], [place], [place] [address]  Blood gas, venous     Status: Abnormal   Collection Time: 02/07/21  4:47 AM  Result Value Ref Range   pH, Ven 7.442 (H) 7.250 - 7.430   pCO2, Ven 39.0 (L) 44.0 - 60.0 mmHg   pO2, Ven 57.4 (H) 32.0 - 45.0 mmHg   Bicarbonate 26.0 20.0 - 28.0 mmol/L   Acid-Base Excess 2.5 (H) 0.0 - 2.0 mmol/L   O2 Saturation 87.1 %   Patient temperature 99.8     Comment: Performed at [facility], [place] [address] [place], [place] [address]  CBC     Status: Abnormal   Collection Time: 02/08/21  4:02 AM  Result Value Ref Range   WBC 10.1 4.0 - 10.5 K/uL   RBC 4.11 (L) 4.22 - 5.81 MIL/uL   Hemoglobin 12.5 (L) 13.0 - 17.0 g/dL   HCT 38.9 (L) 39.0 - 52.0 %   MCV 94.6 80.0 - 100.0 fL   MCH 30.4 26.0 - 34.0 pg   MCHC 32.1 30.0 - 36.0 g/dL   RDW 14.7 11.5 - 15.5 %   Platelets 237 150 - 400 K/uL   nRBC 0.0 0.0 - 0.2 %    Comment: Performed at [facility], [place] [address], [place], [place] [address]   CT PELVIS W CONTRAST  Result Date: 02/06/2021 CLINICAL DATA:  Possible decubitus wound EXAM: CT PELVIS WITH CONTRAST TECHNIQUE: Multidetector CT imaging of the pelvis was performed using the standard protocol following the bolus administration of intravenous contrast. CONTRAST:  63m OMNIPAQUE IOHEXOL 350 MG/ML SOLN COMPARISON:  01/08/2021  FINDINGS: Urinary Tract: Bladder is well distended and somewhat obscured by scatter artifact from bilateral hip replacements. No gross abnormality is noted. Bowel: No obstructive changes are seen. Retained fecal material is noted throughout the visualized colon. Additionally some fecal material appears to lie within the intergluteal cleft. Vascular/Lymphatic: Diffuse vascular calcifications are noted. No aneurysmal dilatation is seen. No lymphadenopathy is noted. Reproductive: Prostate is obscured by scatter artifact from the hip replacements. Other:  No definitive free fluid  is seen. Musculoskeletal: Bilateral hip replacements are seen creating considerable scatter artifact. Some skin thickening is noted posteriorly as well as some subcutaneous edema which may represent some early decubitus changes without definitive skin breakdown. No discrete bony erosive changes are noted. Degenerative changes of lumbar spine are noted. IMPRESSION: No discrete decubitus wound is identified. No bony erosive changes are seen. Skin thickening and some subcutaneous edema is noted however. Fecal material within the gluteal cleft likely related to recent bowel movement No other acute abnormality is noted. Electronically Signed   By: Inez Catalina M.D.   On: 02/06/2021 19:09   DG Chest Port 1 View  Result Date: 02/06/2021 CLINICAL DATA:  Questionable sepsis EXAM: PORTABLE CHEST 1 VIEW COMPARISON:  CT chest dated January 08, 2021 FINDINGS: The heart is mildly enlarged. Atherosclerotic calcification of the aortic arch. Bilateral lower lobe fibrotic changes and pleural calcification are unchanged. Right midlung opacity which may represent atelectasis or infiltrate. IMPRESSION: 1.  Stable cardiomegaly. 2. Bibasilar fibrotic changes and pleural calcifications, likely secondary to prior asbestos exposure. 3. Left midlung opacity which may represent atelectasis or infiltrate. Electronically Signed   By: Keane Police D.O.   On: 02/06/2021 17:26    Pending Labs Unresulted Labs (From admission, onward)     Start     Ordered   02/08/21 1610  Basic metabolic panel  Tomorrow morning,   R        02/07/21 1458   02/06/21 1718  Methylmalonic acid, serum  Once,   R        02/06/21 1718   02/06/21 1701  Urine Culture  (Undifferentiated presentation (screening labs and basic nursing orders))  ONCE - STAT,   STAT       Question:  Indication  Answer:  Sepsis   02/06/21 1701            Vitals/Pain Today's Vitals   02/07/21 2030 02/07/21 2230 02/07/21 2330 02/08/21 0334  BP: (!) 180/87 (!) 176/72 (!)  179/76 122/68  Pulse: (!) 59  (!) 50 (!) 46  Resp: (!) 21 20 (!) 22 (!) 22  Temp:      TempSrc:      SpO2: 92%  95% 92%  Weight:      Height:      PainSc:        Isolation Precautions No active isolations  Medications Medications  acetaminophen (TYLENOL) tablet 650 mg (has no administration in time range)    Or  acetaminophen (TYLENOL) suppository 650 mg (has no administration in time range)  azithromycin (ZITHROMAX) 500 mg in sodium chloride 0.9 % 250 mL IVPB (0 mg Intravenous Stopped 02/08/21 0005)  benzonatate (TESSALON) capsule 200 mg (has no administration in time range)  lactated ringers infusion (0 mLs Intravenous Stopped 02/07/21 1151)  carbidopa-levodopa (SINEMET IR) 25-100 MG per tablet immediate release 1.5 tablet (1.5 tablets Oral Given 02/07/21 1724)  rivaroxaban (XARELTO) tablet 20 mg (20 mg Oral Given 02/07/21 1628)  timolol (TIMOPTIC) 0.5 % ophthalmic solution 1 drop (1 drop  Both Eyes Given 02/07/21 2257)  Ampicillin-Sulbactam (UNASYN) 3 g in sodium chloride 0.9 % 100 mL IVPB ( Intravenous Canceled Entry 02/08/21 0705)  lactated ringers bolus 500 mL (0 mLs Intravenous Stopped 02/06/21 1808)  iohexol (OMNIPAQUE) 350 MG/ML injection 80 mL (80 mLs Intravenous Contrast Given 02/06/21 1848)  cefTRIAXone (ROCEPHIN) 1 g in sodium chloride 0.9 % 100 mL IVPB (0 g Intravenous Stopped 02/06/21 2140)  azithromycin (ZITHROMAX) 500 mg in sodium chloride 0.9 % 250 mL IVPB (0 mg Intravenous Stopped 02/06/21 2345)    Mobility walks with device

## 2021-02-08 NOTE — Progress Notes (Signed)
SLP Cancellation Note  Patient Details Name: Chad Russell MRN: [id number] DOB: 09/02/1926   Cancelled treatment:       Reason Eval/Treat Not Completed: Patient's level of consciousness;Other (comment) (Patient with eyes closed but he was able to respond to questions and opted to wait until tomorrow for swallow eval. He reported he has been using thickened liquids (he thinks nectar thick) for past 3 years and has had home health SLP services)) SLP to return tomorrow for swallow eval.  Sonia Baller, MA, CCC-SLP Speech Therapy

## 2021-02-08 NOTE — Progress Notes (Signed)
PROGRESS NOTE    Chad Russell  MGQ:[id number] DOB: [birth year] DOA: 02/06/2021 PCP: [name], MD    Brief Narrative:  Chad Russell was admitted to the hospital with the working diagnosis of community acquired pneumonia    87 yo male with the past medical history of persistent atrial fibrillation, bradycardia, parkinson's disease, dementia, and HTN who presented with generalized weakness. His wife reported patient having progressive weakness for the last few weeks, associated with decrease ambulatory state and somnolence. On his initial physical examination his blood pressure was 137/64, HR 40 to 46, RR 17 to 24 and oxygen saturation 100% on room air. Heart S1 and S2 bradycardic, lungs with no wheezing or rhonchi, abdomen soft and non tender, no lower extremity edema. Positive dementia, poorly responsive.    Sodium 137, potassium 4.3, chloride 105, bicarb 26, glucose 132, BUN 38, creatinine 1.18, CK6 119, white count 7.3, hemoglobin 12.7, hematocrit 39.9, platelets 225. SARS COVID-19 negative.   Urinalysis specific gravity > 1.046, 30 protein, 11-20 white cells,> 50 red cells.   CT pelvis with some subcutaneous edema.   Chest radiograph with right base pleural calcifications, right lower lobe interstitial infiltrate, left upper lobe interstitial infiltrate.   EKG 39 bpm, normal axis, right bundle branch block, atrial flutter rhythm with PVC, no significant St segment or T wave changes.    Patient was placed on broad spectrum IV antibiotic therapy.    Assessment & Plan:   Principal Problem:   CAP (community acquired pneumonia) Active Problems:   HTN (hypertension)   Atrial fibrillation (HCC)   Generalized weakness   Acute metabolic encephalopathy   Dehydration   Acute prerenal azotemia   Parkinson disease (HCC)   Protein calorie malnutrition (HCC)     Right lower lobe and left upper lobe community acquired pneumonia, aspiration pneumonia.  Advance Parkinson's  disease with rapid decline in physical functional capacity at home.  Tolerating a dysphagia one diet.    wbc is 10,1 and he has been afebrile.   Responding well to antibiotic therapy with Unasyn Continue aspiration precautions, dysphagia 1 diet and follow with speech and swallow recommendations.  Continue oxymetry monitoring and supplemental 02 per [place] to keep 02 saturation 92% or greater.  Follow up with speech therapy recommendations    2. Parkinson's disease, with dementia, ambulatory dysfunction and multiple pressure sores, bilateral buttocks stage 2 present on admission.  Acute metabolic encephalopathy  Decreased vision due to macular degeneration,  Mentation has improved compared to yesterday.    On sinemet, continue local skin care to pressure wounds and follow up with PT and Ot recommendations.    3. HTN.  Blood pressure monitoring, systolic blood pressure 267 this pm    4. Atrial flutter with bradycardia (chronic).  Heart rate has been controlled, continue anticoagulation with Rrivaroxaban.    5. Protein calorie protein malnutrition.  Nutritional supplements.   Patient continue to be at high risk for worsening pneumonia   Status is: Inpatient  Remains inpatient appropriate because: IV antibiotics and oxymetry monitoring    DVT prophylaxis: Rivaroxaban   Code Status:    full  Family Communication:   I spoke with patient's wife at the bedside, we talked in detail about patient's condition, plan of care and prognosis and all questions were addressed.    Antimicrobials:  Unasyn     Subjective: Patient with eyes closed, feeling better, dyspnea improving, no nausea or vomiting, no chest pain.   Objective: Vitals:   02/07/21 2330 02/08/21  0334 02/08/21 0600 02/08/21 0704  BP: (!) 179/76 122/68 113/68 128/69  Pulse: (!) 50 (!) 46 (!) 38 (!) 37  Resp: (!) 22 (!) 22 17 20   Temp:   98.4 F (36.9 C) 98.7 F (37.1 C)  TempSrc:    Oral  SpO2: 95% 92% 95% 94%  Weight:       Height:        Intake/Output Summary (Last 24 hours) at 02/08/2021 2426 Last data filed at 02/07/2021 1842 Gross per 24 hour  Intake 784.17 ml  Output --  Net 784.17 ml   Filed Weights   02/06/21 1707  Weight: 77 kg    Examination:   General: Not in pain or dyspnea, deconditioned  Neurology: eyes closed but responding to questions   E ENT: mild pallor, no icterus, oral mucosa moist Cardiovascular: No JVD. S1-S2 present, rhythmic, no gallops, rubs, or murmurs. No lower extremity edema. Pulmonary: positive breath sounds bilaterally, adequate air movement, no wheezing, rhonchi or rales. Gastrointestinal. Abdomen soft and non tender Skin. No rashes Musculoskeletal: no joint deformities     Data Reviewed: I have personally reviewed following labs and imaging studies  CBC: Recent Labs  Lab 02/06/21 1718 02/07/21 0447 02/08/21 0402  WBC 7.3 7.4 10.1  NEUTROABS 4.6 4.3  --   HGB 12.7* 11.9* 12.5*  HCT 39.9 37.3* 38.9*  MCV 96.8 95.6 94.6  PLT 225 212 834   Basic Metabolic Panel: Recent Labs  Lab 02/06/21 1718 02/07/21 0447 02/08/21 0402  NA 137 136 137  K 4.3 4.0 4.0  CL 105 105 108  CO2 26 25 24   GLUCOSE 132* 100* 103*  BUN 38* 36* 29*  CREATININE 1.18 0.91 0.90  CALCIUM 8.9 8.5* 8.3*  MG 2.4 2.1  --   PHOS  --  2.8  --    GFR: Estimated Creatinine Clearance: 57 mL/min (by C-G formula based on SCr of 0.9 mg/dL). Liver Function Tests: Recent Labs  Lab 02/06/21 1718 02/07/21 0447  AST 37 40  ALT 17 23  ALKPHOS 82 70  BILITOT 0.5 0.6  PROT 6.8 6.0*  ALBUMIN 3.0* 2.8*   No results for input(s): LIPASE, AMYLASE in the last 168 hours. Recent Labs  Lab 02/07/21 0447  AMMONIA 19   Coagulation Profile: Recent Labs  Lab 02/06/21 1718  INR 1.9*   Cardiac Enzymes: Recent Labs  Lab 02/06/21 1718  CKTOTAL 619*   BNP (last 3 results) No results for input(s): PROBNP in the last 8760 hours. HbA1C: No results for input(s): HGBA1C in the last  72 hours. CBG: No results for input(s): GLUCAP in the last 168 hours. Lipid Profile: No results for input(s): CHOL, HDL, LDLCALC, TRIG, CHOLHDL, LDLDIRECT in the last 72 hours. Thyroid Function Tests: Recent Labs    02/06/21 1718  TSH 4.139   Anemia Panel: No results for input(s): VITAMINB12, FOLATE, FERRITIN, TIBC, IRON, RETICCTPCT in the last 72 hours.    Radiology Studies: I have reviewed all of the imaging during this hospital visit personally     Scheduled Meds:  carbidopa-levodopa  1.5 tablet Oral BID PC   rivaroxaban  20 mg Oral Q supper   timolol  1 drop Both Eyes BID   Continuous Infusions:  ampicillin-sulbactam (UNASYN) IV Stopped (02/08/21 0200)   azithromycin Stopped (02/08/21 0005)     LOS: 2 days        Monifa Blanchette Gerome Apley, MD

## 2021-02-08 NOTE — Plan of Care (Signed)
  Problem: Clinical Measurements: Goal: Ability to maintain clinical measurements within normal limits will improve Outcome: Progressing   Problem: Clinical Measurements: Goal: Will remain free from infection Outcome: Progressing   Problem: Clinical Measurements: Goal: Diagnostic test results will improve Outcome: Progressing   

## 2021-02-08 NOTE — Consult Note (Signed)
[facility] Nurse Consult Note: Reason for Consult:Unstageable pressure injuries POA. The consult is performed remotely with review of the medical record and assistance from the two clinicians named below. The input of Wound Treatment Associate D. Dark, RN and the Bedside RN, L. Fernatt were instrumental in the development of the initial POC. Wound type:Pressure Pressure Injury POA: Yes Measurement: Coccyx:  4cm round nonviable eschar Left lateral malleolus:  1cm round nonviable eschar (stable) Wound VAN:VBTY viable Drainage (amount, consistency, odor) None Periwound: intact, dry Dressing procedure/placement/frequency: A mattress replacement with low air loss feature has been placed. I will add bilateral pressure redistribution heel boots and guidance for turning and repositioning.  Topical care for the left lateral malleolus Unstageable pressure injury will be with xeroform gauze once daily; placement in the off-loading boots will assist with tissue repair and regeneration. The coccygeal Unstageable pressure injury will be treated with an enzymatic debriding agent (collagenase/Santyl) once daily and PRN soiling, applied in a 1/8 inch layer. This is to be topped with a saline moistened gauze, followed by a dry gauze and secured with a silicone foam. Turning and repositioning is the cornerstone of the POC, but nutrition and normalization of the other comorbid conditions as able all have a significant role.  Recommendation: Provider may also consider an assessment by General Surgery for consideration of surgical debridement of the coccygeal wound. They may also consider adjunct hydrotherapy several (3xW) times a week to loosen the nonviable tissue. If you agree, please consult CCS/Surgery.  [facility] nursing team will not follow, but will remain available to this patient, the nursing and medical teams.  Please re-consult if needed. Thanks, Maudie Flakes, MSN, RN, [place], Arther Abbott  Pager# [phone]

## 2021-02-09 ENCOUNTER — Telehealth: Payer: Self-pay | Admitting: Family Medicine

## 2021-02-09 LAB — BASIC METABOLIC PANEL
Anion gap: 6 (ref 5–15)
BUN: 27 mg/dL — ABNORMAL HIGH (ref 8–23)
CO2: 25 mmol/L (ref 22–32)
Calcium: 8.4 mg/dL — ABNORMAL LOW (ref 8.9–10.3)
Chloride: 109 mmol/L (ref 98–111)
Creatinine, Ser: 0.87 mg/dL (ref 0.61–1.24)
GFR, Estimated: >60 mL/min (ref >60)
Glucose, Bld: 99 mg/dL (ref 70–99)
Potassium: 3.7 mmol/L (ref 3.5–5.1)
Sodium: 140 mmol/L (ref 135–145)

## 2021-02-09 LAB — CBC
HCT: 35.5 % — ABNORMAL LOW (ref 39.0–52.0)
Hemoglobin: 11.5 g/dL — ABNORMAL LOW (ref 13.0–17.0)
MCH: 30.4 pg (ref 26.0–34.0)
MCHC: 32.4 g/dL (ref 30.0–36.0)
MCV: 93.9 fL (ref 80.0–100.0)
Platelets: 226 10*3/uL (ref 150–400)
RBC: 3.78 MIL/uL — ABNORMAL LOW (ref 4.22–5.81)
RDW: 14.6 % (ref 11.5–15.5)
WBC: 9.9 10*3/uL (ref 4.0–10.5)
nRBC: 0 % (ref 0.0–0.2)

## 2021-02-09 LAB — GLUCOSE, CAPILLARY: Glucose-Capillary: 125 mg/dL — ABNORMAL HIGH (ref 70–99)

## 2021-02-09 MED ORDER — PROSOURCE PLUS PO LIQD
30.0000 mL | Freq: Two times a day (BID) | ORAL | Status: DC
  Administered 2021-02-09 – 2021-02-13 (×9): 30 mL via ORAL
  Filled 2021-02-09 (×7): qty 30

## 2021-02-09 MED ORDER — ADULT MULTIVITAMIN W/MINERALS CH
1.0000 | ORAL_TABLET | Freq: Every day | ORAL | Status: DC
  Administered 2021-02-09 – 2021-02-13 (×5): 1 via ORAL
  Filled 2021-02-09 (×5): qty 1

## 2021-02-09 NOTE — NC FL2 (Signed)
[place] LEVEL OF CARE SCREENING TOOL     IDENTIFICATION  Patient Name: Chad Russell Birthdate: 1926-05-18 Sex: male Admission Date (Current Location): 02/06/2021  [facility] and [place] Number:  Herbalist and Address:  [facility],  [place], [place]      Provider Number: [id number]  Attending Physician Name and Address:  Tawni Millers  Relative Name and Phone Number:       Current Level of Care: Hospital Recommended Level of Care: [place] Prior Approval Number:    Date Approved/Denied:   PASRR Number: [id number]  Discharge Plan: SNF    Current Diagnoses: Patient Active Problem List   Diagnosis Date Noted   Generalized weakness 77/93/9030   Acute metabolic encephalopathy 11/14/3005   Dehydration 02/07/2021   Acute prerenal azotemia 02/07/2021   Parkinson disease ([place])    Protein calorie malnutrition ([place])    CAP (community acquired pneumonia) 02/06/2021   UTI (urinary tract infection) 10/14/2020   Bradycardia 10/14/2020   Educated about COVID-19 virus infection 11/29/2019   Exudative age-related macular degeneration of left eye with active choroidal neovascularization ([place]) 10/02/2019   Central retinal vein occlusion with macular edema of right eye 06/20/2019   Central retinal vein occlusion with neovascularization of right eye 06/20/2019   Ocular ischemic syndrome 06/20/2019   Retinal hemorrhage of left eye 06/20/2019   Retinal hemorrhage of right eye 06/20/2019   AKI (acute kidney injury) ([place]) 12/15/2018   Hematoma of scalp    Heart block    Chest pain 05/04/2018   Fracture of femoral neck, left ([place]) 04/19/2017   MGUS (monoclonal gammopathy of unknown significance) 01/15/2017   BPH with obstruction/lower urinary tract symptoms 09/07/2016   Preoperative cardiovascular examination 08/24/2016   Urinary retention 05/13/2016   Balance problem 10/31/2013   OSA (obstructive  sleep apnea) 10/31/2013   BPH (benign prostatic hyperplasia) 08/28/2012   Atrial fibrillation ([place]) 06/08/2012   Hyperplasia of prostate with lower urinary tract symptoms (LUTS) 04/28/2012   Moderate malnutrition ([place]) 04/28/2012   Infection of urinary tract - recurrent 04/26/2012   NSTEMI (non-ST elevated myocardial infarction) ([place]) 03/15/2011   Complete heart block ([place]) 03/15/2011   HTN (hypertension) 03/15/2011    Orientation RESPIRATION BLADDER Height & Weight     Self, Situation, Place  Normal Continent Weight: 76.2 kg Height:  6' (182.9 cm)  BEHAVIORAL SYMPTOMS/MOOD NEUROLOGICAL BOWEL NUTRITION STATUS      Continent Diet (regular)  AMBULATORY STATUS COMMUNICATION OF NEEDS Skin   Extensive Assist Verbally Normal                       Personal Care Assistance Level of Assistance  Bathing, Feeding, Dressing Bathing Assistance: Limited assistance Feeding assistance: Limited assistance Dressing Assistance: Limited assistance     Functional Limitations Info  Sight, Hearing, Speech Sight Info: Adequate Hearing Info: Adequate Speech Info: Adequate    SPECIAL CARE FACTORS FREQUENCY  PT (By licensed PT), OT (By licensed OT)     PT Frequency: 5x weekly OT Frequency: 5xweekly            Contractures      Additional Factors Info  Code Status Code Status Info: full             Current Medications (02/09/2021):  This is the current hospital active medication list Current Facility-Administered Medications  Medication Dose Route Frequency Provider Last Rate Last Admin   acetaminophen (TYLENOL) tablet 650 mg  650 mg Oral Q6H PRN Howerter, Justin B, DO       Or   acetaminophen (TYLENOL) suppository 650 mg  650 mg Rectal Q6H PRN Howerter, Justin B, DO       Ampicillin-Sulbactam (UNASYN) 3 g in sodium chloride 0.9 % 100 mL IVPB  3 g Intravenous Q6H Lenis Noon, RPH 200 mL/hr at 02/09/21 0529 3 g at 02/09/21 1610   benzonatate (TESSALON) capsule 200 mg  200  mg Oral TID PRN Howerter, Justin B, DO       carbidopa-levodopa (SINEMET IR) 25-100 MG per tablet immediate release 1.5 tablet  1.5 tablet Oral BID PC Howerter, Justin B, DO   1.5 tablet at 02/08/21 1712   collagenase (SANTYL) ointment   Topical Daily Arrien, Jimmy Picket, MD   Given at 02/08/21 1414   rivaroxaban (XARELTO) tablet 20 mg  20 mg Oral Q supper Howerter, Justin B, DO   20 mg at 02/08/21 1712   timolol (TIMOPTIC) 0.5 % ophthalmic solution 1 drop  1 drop Both Eyes BID Arrien, Jimmy Picket, MD   1 drop at 02/08/21 2228     Discharge Medications: Please see discharge summary for a list of discharge medications.  Relevant Imaging Results:  Relevant Lab Results:   Additional Information SSN: [id number]  Leeroy Cha, RN

## 2021-02-09 NOTE — TOC Initial Note (Addendum)
Transition of Care [facility]) - Initial/Assessment Note    Patient Details  Name: Chad Russell MRN: [id number] Date of Birth: 09-28-1926  [facility]) CM/SW Contact:    Leeroy Cha, RN Phone Number: 02/09/2021, 9:02 AM  Clinical Narrative:                  Transition of Care [facility]) Screening Note   Patient Details  Name: Chad Russell Date of Birth: January 22, 1927   Transition of Care [facility]) CM/SW Contact:    Leeroy Cha, RN Phone Number: 02/09/2021, 9:02 AM    Transition of Care Department [facility]) has reviewed patient and no TOC needs have been identified at this time. We will continue to monitor patient advancement through interdisciplinary progression rounds. If new patient transition needs arise, please place a TOC consult.  NOTE: has dementia IS FROM HOME WITH [facility]. PT SUGGESTING SNF placement. [facility] number is [phone] A  ssn [phone] Tct-wife has selected [facility].  Patient was there several months ago. Will need auth through [employer]. Expected Discharge Plan: Skilled Nursing Facility Barriers to Discharge: Continued Medical Work up   Patient Goals and CMS Choice Patient states their goals for this hospitalization and ongoing recovery are:: not stated CMS Medicare.gov Compare Post Acute Care list provided to:: Patient Represenative (must comment) (wife)    Expected Discharge Plan and Services Expected Discharge Plan: [place]   Discharge Planning Services: CM Consult Post Acute Care Choice: [place] Living arrangements for the past 2 months: Single Family Home                                      Prior Living Arrangements/Services Living arrangements for the past 2 months: Single Family Home Lives with:: Spouse Patient language and need for interpreter reviewed:: Yes Do you feel safe going back to the place where you live?: Yes            Criminal Activity/Legal Involvement  Pertinent to Current Situation/Hospitalization: No - Comment as needed  Activities of Daily Living Home Assistive Devices/Equipment: Environmental consultant (specify type), Eyeglasses (uses a rolling office chair per wife) ADL Screening (condition at time of admission) Patient's cognitive ability adequate to safely complete daily activities?: No Is the patient deaf or have difficulty hearing?: Yes Does the patient have difficulty seeing, even when wearing glasses/contacts?: Yes Does the patient have difficulty concentrating, remembering, or making decisions?: Yes Patient able to express need for assistance with ADLs?: Yes Does the patient have difficulty dressing or bathing?: Yes Independently performs ADLs?: No Communication: Appropriate for developmental age, Needs assistance Dressing (OT): Needs assistance Is this a change from baseline?: Pre-admission baseline Grooming: Needs assistance Is this a change from baseline?: Pre-admission baseline Feeding: Dependent Is this a change from baseline?: Pre-admission baseline Bathing: Dependent Is this a change from baseline?: Pre-admission baseline Toileting: Dependent Is this a change from baseline?: Pre-admission baseline In/Out Bed: Dependent Is this a change from baseline?: Pre-admission baseline Walks in Home: Dependent Is this a change from baseline?: Pre-admission baseline Does the patient have difficulty walking or climbing stairs?: Yes Weakness of Legs: Both Weakness of Arms/Hands: Both  Permission Sought/Granted                  Emotional Assessment Appearance:: Appears stated age Attitude/Demeanor/Rapport: Inconsistent Affect (typically observed): Calm Orientation: : Oriented to Place, Oriented to Self, Oriented to Situation Alcohol /  Substance Use: Not Applicable Psych Involvement: No (comment)  Admission diagnosis:  CAP (community acquired pneumonia) [J18.9] Failure to thrive in adult [R62.7] Community acquired pneumonia of  left lung, unspecified part of lung [J18.9] Patient Active Problem List   Diagnosis Date Noted   Generalized weakness 37/11/6267   Acute metabolic encephalopathy 48/54/6270   Dehydration 02/07/2021   Acute prerenal azotemia 02/07/2021   Parkinson disease ([place])    Protein calorie malnutrition ([place])    CAP (community acquired pneumonia) 02/06/2021   UTI (urinary tract infection) 10/14/2020   Bradycardia 10/14/2020   Educated about COVID-19 virus infection 11/29/2019   Exudative age-related macular degeneration of left eye with active choroidal neovascularization ([place]) 10/02/2019   Central retinal vein occlusion with macular edema of right eye 06/20/2019   Central retinal vein occlusion with neovascularization of right eye 06/20/2019   Ocular ischemic syndrome 06/20/2019   Retinal hemorrhage of left eye 06/20/2019   Retinal hemorrhage of right eye 06/20/2019   AKI (acute kidney injury) ([place]) 12/15/2018   Hematoma of scalp    Heart block    Chest pain 05/04/2018   Fracture of femoral neck, left ([place]) 04/19/2017   MGUS (monoclonal gammopathy of unknown significance) 01/15/2017   BPH with obstruction/lower urinary tract symptoms 09/07/2016   Preoperative cardiovascular examination 08/24/2016   Urinary retention 05/13/2016   Balance problem 10/31/2013   OSA (obstructive sleep apnea) 10/31/2013   BPH (benign prostatic hyperplasia) 08/28/2012   Atrial fibrillation ([place]) 06/08/2012   Hyperplasia of prostate with lower urinary tract symptoms (LUTS) 04/28/2012   Moderate malnutrition ([place]) 04/28/2012   Infection of urinary tract - recurrent 04/26/2012   NSTEMI (non-ST elevated myocardial infarction) ([place]) 03/15/2011   Complete heart block ([place]) 03/15/2011   HTN (hypertension) 03/15/2011   PCP:  [name], MD Pharmacy:   [facility] [id number], [place] [address] Phone: [phone] Fax:  [phone]     Social Determinants of Health (SDOH) Interventions    Readmission Risk Interventions No flowsheet data found.

## 2021-02-09 NOTE — Progress Notes (Addendum)
PROGRESS NOTE    Chad Russell  UTM:[id number] DOB: 02/22/1927 DOA: 02/06/2021 PCP: [name], MD    Brief Narrative:  Chad Russell was admitted to the hospital with the working diagnosis of community acquired pneumonia    87 yo male with the past medical history of persistent atrial fibrillation, bradycardia, parkinson's disease, dementia, and HTN who presented with generalized weakness. His wife reported patient having progressive weakness for the last few weeks, associated with decrease ambulatory state and somnolence. On his initial physical examination his blood pressure was 137/64, HR 40 to 46, RR 17 to 24 and oxygen saturation 100% on room air. Heart S1 and S2 bradycardic, lungs with no wheezing or rhonchi, abdomen soft and non tender, no lower extremity edema. Positive dementia, poorly responsive.    Sodium 137, potassium 4.3, chloride 105, bicarb 26, glucose 132, BUN 38, creatinine 1.18, CK6 119, white count 7.3, hemoglobin 12.7, hematocrit 39.9, platelets 225. SARS COVID-19 negative.   Urinalysis specific gravity > 1.046, 30 protein, 11-20 white cells,> 50 red cells.   CT pelvis with some subcutaneous edema.   Chest radiograph with right base pleural calcifications, right lower lobe interstitial infiltrate, left upper lobe interstitial infiltrate.   EKG 39 bpm, normal axis, right bundle branch block, atrial flutter rhythm with PVC, no significant St segment or T wave changes.    Patient was placed on broad spectrum IV antibiotic therapy.   Clinically improving, but continue to be very weak and deconditioned, plan to transfer to SNF.    Assessment & Plan:   Principal Problem:   CAP (community acquired pneumonia) Active Problems:   HTN (hypertension)   Atrial fibrillation (HCC)   Generalized weakness   Acute metabolic encephalopathy   Dehydration   Acute prerenal azotemia   Parkinson disease (HCC)   Protein calorie malnutrition (HCC)     Right lower lobe  and left upper lobe community acquired pneumonia, aspiration pneumonia.  Advance Parkinson's disease with rapid decline in physical functional capacity at home.  Patient is feeling better today, he is more awake and reactive.  Continue with aspiration precautions and dysphagia diet.    Continue antibiotic therapy with Unasyn Wbc is 9,9 and he has been afebrile.    2. Parkinson's disease, with dementia, ambulatory dysfunction and multiple pressure sores, bilateral buttocks stage 2 present on admission.  Acute metabolic encephalopathy  Decreased vision due to macular degeneration,   Continue to be very weak and deconditioned, not back to hid baseline, plan to continue therapy at SNF. Continue with local wound care.  His mentation has been improving. Able to respond to questions and follow simple commands.   3. HTN.  Systolic blood pressure has been 150 to 180 mmHg,  Plan to resume lisinopril for blood pressure control.    4. Atrial flutter with bradycardia (chronic).  Anticoagulation with rivaroxaban His rate has been well controlled, will discontinue telemetry   5. Protein calorie protein malnutrition. (moderate) Continue with nutritional supplements.    Status is: Inpatient  Remains inpatient appropriate because: IV antibiotic therapy        DVT prophylaxis: Rivaroxaban   Code Status:    full  Family Communication: I spoke with patient's wife at the bedside, we talked in detail about patient's condition, plan of care and prognosis and all questions were addressed.   Antimicrobials:  Unasyn    Subjective: Patient is more awake and alert, no nausea or vomiting and tolerating po well. Dyspnea has been improving, but continue to  be very weak and deconditioned   Objective: Vitals:   02/08/21 1954 02/09/21 0300 02/09/21 0424 02/09/21 0800  BP: 125/64  (!) 156/60   Pulse: (!) 43  73   Resp: 20  20 20   Temp: 99.7 F (37.6 C)  98 F (36.7 C)   TempSrc: Oral  Oral    SpO2: 96%  98%   Weight:  76.2 kg    Height:        Intake/Output Summary (Last 24 hours) at 02/09/2021 1149 Last data filed at 02/09/2021 0900 Gross per 24 hour  Intake 120 ml  Output 600 ml  Net -480 ml   Filed Weights   02/06/21 1707 02/09/21 0300  Weight: 77 kg 76.2 kg    Examination:   General: Not in pain or dyspnea, deconditioned  Neurology: Awake and alert,  E ENT: mild pallor, no icterus, oral mucosa moist Cardiovascular: No JVD. S1-S2 present, rhythmic, no gallops, rubs, or murmurs. No lower extremity edema. Pulmonary: positive breath sounds bilaterally, with no wheezing, rhonchi or rales. Gastrointestinal. Abdomen soft and non tender Skin. Pressure ulcers with dressing in place.  Musculoskeletal: no joint deformities     Data Reviewed: I have personally reviewed following labs and imaging studies  CBC: Recent Labs  Lab 02/06/21 1718 02/07/21 0447 02/08/21 0402 02/09/21 0416  WBC 7.3 7.4 10.1 9.9  NEUTROABS 4.6 4.3  --   --   HGB 12.7* 11.9* 12.5* 11.5*  HCT 39.9 37.3* 38.9* 35.5*  MCV 96.8 95.6 94.6 93.9  PLT 225 212 237 213   Basic Metabolic Panel: Recent Labs  Lab 02/06/21 1718 02/07/21 0447 02/08/21 0402 02/09/21 0416  NA 137 136 137 140  K 4.3 4.0 4.0 3.7  CL 105 105 108 109  CO2 26 25 24 25   GLUCOSE 132* 100* 103* 99  BUN 38* 36* 29* 27*  CREATININE 1.18 0.91 0.90 0.87  CALCIUM 8.9 8.5* 8.3* 8.4*  MG 2.4 2.1  --   --   PHOS  --  2.8  --   --    GFR: Estimated Creatinine Clearance: 58.4 mL/min (by C-G formula based on SCr of 0.87 mg/dL). Liver Function Tests: Recent Labs  Lab 02/06/21 1718 02/07/21 0447  AST 37 40  ALT 17 23  ALKPHOS 82 70  BILITOT 0.5 0.6  PROT 6.8 6.0*  ALBUMIN 3.0* 2.8*   No results for input(s): LIPASE, AMYLASE in the last 168 hours. Recent Labs  Lab 02/07/21 0447  AMMONIA 19   Coagulation Profile: Recent Labs  Lab 02/06/21 1718  INR 1.9*   Cardiac Enzymes: Recent Labs  Lab  02/06/21 1718  CKTOTAL 619*   BNP (last 3 results) No results for input(s): PROBNP in the last 8760 hours. HbA1C: No results for input(s): HGBA1C in the last 72 hours. CBG: No results for input(s): GLUCAP in the last 168 hours. Lipid Profile: No results for input(s): CHOL, HDL, LDLCALC, TRIG, CHOLHDL, LDLDIRECT in the last 72 hours. Thyroid Function Tests: Recent Labs    02/06/21 1718  TSH 4.139   Anemia Panel: No results for input(s): VITAMINB12, FOLATE, FERRITIN, TIBC, IRON, RETICCTPCT in the last 72 hours.    Radiology Studies: I have reviewed all of the imaging during this hospital visit personally     Scheduled Meds:  carbidopa-levodopa  1.5 tablet Oral BID PC   collagenase   Topical Daily   rivaroxaban  20 mg Oral Q supper   timolol  1 drop Both Eyes BID  Continuous Infusions:  ampicillin-sulbactam (UNASYN) IV 3 g (02/09/21 1144)     LOS: 3 days        Breanna Shorkey Gerome Apley, MD

## 2021-02-09 NOTE — Plan of Care (Signed)
  Problem: Clinical Measurements: Goal: Ability to maintain clinical measurements within normal limits will improve Outcome: Progressing   Problem: Clinical Measurements: Goal: Diagnostic test results will improve Outcome: Progressing   Problem: Clinical Measurements: Goal: Respiratory complications will improve Outcome: Progressing   

## 2021-02-09 NOTE — Consult Note (Signed)
WOC Nurse Consult Note: Reason for Consult:multiple pressure injuries Did not note that this patient was seen by my partner yesterday.  Wound type: Coccyx: Unstageable Pressure Injury 4cm x 5cm x 0.2cm; 100% black/yellow non viable tissue Right achilles: Deep Tissue Pressure Injury: 2cm x 2cm x 0cm; 100% dark purple, non blanchable Right dorsal foot: Stage 2 Pressure Injury: serous filled blister; silicone foam  Right upper buttock; Stage 2 Pressure Injury: 100% pink and clean Left lateral malleolus: 0.3cm x 0.3cm x 0.1cm; scabbed Wounds were all present on admission, I have added to the  nursing flow sheet a bit more accurately  Pressure Injury POA: Yes Measurement:see above  Wound bed:see above  Drainage (amount, consistency, odor) minimal; sacrum/coccyx  Periwound: frail, thin Dressing procedure/placement/frequency: Add air mattress for moisture management and pressure redistribution Continue enzymatic debridement ointment to the sacrum/coccyx Add Prevalon boots to offload heels and keep rotation of the left foot at minium. I think the left foot is rotating laterally onto the top of the right foot. Silicone foam to the right achilles area Purewick to contain urine and protect skin from exposure to bladder incontinence Add RD for consultation for wound healing  Continue xeroform to the left lateral malleolus.  Discussed POC with patient and bedside nurse.  Re consult if needed, will not follow at this time. Thanks  Dixie Coppa [employer], RN,CWOCN, CNS, [place] [phone])

## 2021-02-09 NOTE — Progress Notes (Signed)
Initial Nutrition Assessment  DOCUMENTATION CODES:   Not applicable  INTERVENTION:  - will order Hormel Shake BID, each supplement provides 500 kcal and 20 grams protein. - will order 30 ml Prosource Plus BID, each supplement provides 100 kcal and 15 grams protein.  - will order 1 tablet multivitamin with minerals/day. - complete NFPE when feasible.    NUTRITION DIAGNOSIS:   Increased nutrient needs related to acute illness as evidenced by estimated needs.   GOAL:   Patient will meet greater than or equal to 90% of their needs  MONITOR:   PO intake, Supplement acceptance, Labs, Weight trends  REASON FOR ASSESSMENT:   Consult Assessment of nutrition requirement/status  ASSESSMENT:   87 y.o. male with medical history of persistent afib chronically on Xarelto, chronic bradycardia, Parkinson's disease complicated by dementia, and HTN. He presented to the ED on 12/16 with CAP and generalized weakness.  Unable to see patient at time of attempted visit. He was seen by a [place] RD on 10/17/20 at which time he was a/o to self only and no visitors were in the room so RD was unable to obtain nutrition-related hx.   SLP conducted bedside swallow evaluation this afternoon and recommendation is for Dysphagia 1, nectar-thick liquids.   Weight today is 168 b and weight has been stable x2 months with weight of 170 lb on 12/12/20. Weight on 10/24/20 was 175 lb which indicates 7 lb weight loss (4% body weight) in the past 3.5 months; not significant for time frame.    Labs reviewed; BUN: 27 mg/dl, Ca: 8.4 mg/dl Medications reviewed.    NUTRITION - FOCUSED PHYSICAL EXAM:  Unable to complete at this time  Diet Order:   Diet Order             DIET - DYS 1 Room service appropriate? Yes; Fluid consistency: Nectar Thick  Diet effective now                   EDUCATION NEEDS:   Not appropriate for education at this time  Skin:  Skin Assessment: Reviewed RN Assessment  Last  BM:  12/19 (type 6 x1, medium amount)  Height:   Ht Readings from Last 1 Encounters:  02/06/21 6' (1.829 m)    Weight:   Wt Readings from Last 1 Encounters:  02/09/21 76.2 kg     Estimated Nutritional Needs:  Kcal:  1700-1900 kcal Protein:  85-95 grams Fluid:  >/= 1.8 L/day      Jarome Matin, MS, RD, LDN, CNSC Inpatient Clinical Dietitian RD pager # available in [facility]  After hours/weekend pager # available in [facility]

## 2021-02-09 NOTE — Telephone Encounter (Signed)
I have placed orders from [facility] home health in the bin upfront   Order # [id number]

## 2021-02-09 NOTE — Evaluation (Signed)
Clinical/Bedside Swallow Evaluation Patient Details  Name: Chad Russell MRN: [id number] Date of Birth: 1926/08/02  Today's Date: 02/09/2021 Time: SLP Start Time (ACUTE ONLY): 1205 SLP Stop Time (ACUTE ONLY): 1235 SLP Time Calculation (min) (ACUTE ONLY): 30 min  Past Medical History:  Past Medical History:  Diagnosis Date   Arthritis    Atrial fibrillation ([place])    Balance problem 10/31/2013   BPH (benign prostatic hyperplasia) 2014   Urology Dr [name] with [facility] [phone]    Bradycardia    a. baseline HR in the 30's. Asymptomatic - no history of PPM placement.    CAD (coronary artery disease)    LHC 03/16/11: Mid LAD 10-20%, proximal circumflex 10%, mid RCA 20%, EF 55%.   Cataract    Complete heart block (HCC)    Fracture of femoral neck, right ([place]) 04/19/2017   GERD (gastroesophageal reflux disease)    takes occasional  zantac   Hx of echocardiogram    Echo 02/2011: EF 65-70%, normal wall motion, MAC, mild MR, mild LAE, mild RVE   Hypertension    OSA (obstructive sleep apnea)    per patient went to have sleep study done about 2 years ago, bu they never F/U with him about results; but wife reports he still has periods of apnea at night    Parkinson disease ([place])    Talar fracture    casted no surg   UTI (urinary tract infection) 10/2020   Past Surgical History:  Past Surgical History:  Procedure Laterality Date   ABDOMINAL AORTOGRAM W/LOWER EXTREMITY N/A 01/17/2017   Procedure: ABDOMINAL AORTOGRAM W/LOWER EXTREMITY;  Surgeon: Angelia Mould, MD;  Location: [place] [facility];  Service: Cardiovascular;  Laterality: N/A;   COLONOSCOPY     Repeated and normal in 2011   FRACTURE SURGERY     HEMIARTHROPLASTY HIP Right 04/19/2017   HEMORROIDECTOMY     HIP ARTHROPLASTY Right 04/19/2017   Procedure: ARTHROPLASTY BIPOLAR HIP (HEMIARTHROPLASTY);  Surgeon: Marchia Bond, MD;  Location: [place];  Service: Orthopedics;  Laterality: Right;   HIP ARTHROPLASTY Left  12/16/2018   Procedure: ARTHROPLASTY BIPOLAR HIP (HEMIARTHROPLASTY);  Surgeon: Altamese Ord, MD;  Location: [place];  Service: Orthopedics;  Laterality: Left;   INGUINAL HERNIA REPAIR Right 02/26/2014   Procedure: LAPAROSCOPIC RIGHT INGUINAL HERNIA REPAIR;  Surgeon: Ralene Ok, MD;  Location: [place];  Service: General;  Laterality: Right;   INGUINAL HERNIA REPAIR     INSERTION OF MESH Right 02/26/2014   Procedure: INSERTION OF MESH;  Surgeon: Ralene Ok, MD;  Location: [place];  Service: General;  Laterality: Right;   LEFT HEART CATHETERIZATION WITH CORONARY ANGIOGRAM N/A 03/16/2011   Procedure: LEFT HEART CATHETERIZATION WITH CORONARY ANGIOGRAM;  Surgeon: Peter M Martinique, MD;  Location: [facility];  Service: Cardiovascular;  Laterality: N/A;   PROSTATE BIOPSY     negative - [facility]   TRANSURETHRAL RESECTION OF PROSTATE N/A 09/07/2016   Procedure: TRANSURETHRAL RESECTION OF THE PROSTATE (TURP);  Surgeon: Irine Seal, MD;  Location: [facility];  Service: Urology;  Laterality: N/A;   HPI:  Patient is a 87 y.o. male with PMH: persistent atrial fibrillation, bradycardia, parkinson's disease, dementia, and HTN who presented with generalized weakness. His wife reported patient having progressive weakness for the last few weeks, associated with decrease ambulatory state and somnolence. CXR revealed right base pleural calcifcations, right lower lobe interstitial infiltrate, left upper lobe interstitial infiltrate. Per family report, he was receiving [place] SLP and was on thickened liquids.  Assessment / Plan / Recommendation  Clinical Impression  Patient presents with clinical s/s of dysphagia as per this bedside swallow evaluation. When SLP arrived in room, patient with eyes closed but responds to voice and agreeable to having some of liquids and solids on lunch tray. Patient able to hold cup of nectar thick juices with setup and some intermittent assistance with steadying cup. Patient would comment,  "I have an unstable grip" when he needed assistance with cup. No overt s/s aspiration or penetration observed but patient did have slow oral transit (suspect secondary to Parkinson's). He accepted three bites of puree solids and although he exhibited mild delay in oral transit, no other difficulties. He consumed approximately 5-6 ounces of nectar thick liquds before declining any more solids. Patient's wife arrived at end of session and she provided some history, that patient had been getting [facility] SLP services for his swallowing impairment (due to Parkinson's disease). SLP recommending continue with Dys 1, nectar thick liquids and will f/u for toleration, potential benefit from objective swallow study (MBS) and potential for any PO texture solids or liquids advancements. SLP Visit Diagnosis: Dysphagia, unspecified (R13.10)    Aspiration Risk  Mild aspiration risk;Moderate aspiration risk    Diet Recommendation Dysphagia 1 (Puree);Nectar-thick liquid   Liquid Administration via: Cup Medication Administration: Crushed with puree Supervision: Full supervision/cueing for compensatory strategies;Staff to assist with self feeding Compensations: Slow rate;Small sips/bites Postural Changes: Seated upright at 90 degrees    Other  Recommendations Oral Care Recommendations: Oral care BID;Staff/trained caregiver to provide oral care    Recommendations for follow up therapy are one component of a multi-disciplinary discharge planning process, led by the attending physician.  Recommendations may be updated based on patient status, additional functional criteria and insurance authorization.  Follow up Recommendations Skilled nursing-short term rehab (<3 hours/day)      Assistance Recommended at Discharge Frequent or constant Supervision/Assistance  Functional Status Assessment Patient has had a recent decline in their functional status and demonstrates the ability to make significant improvements in function  in a reasonable and predictable amount of time.  Frequency and Duration min 2x/week  1 week       Prognosis Prognosis for Safe Diet Advancement: Fair Barriers to Reach Goals: Severity of deficits      Swallow Study   General Date of Onset: 02/06/21 HPI: Patient is a 87 y.o. male with PMH: persistent atrial fibrillation, bradycardia, parkinson's disease, dementia, and HTN who presented with generalized weakness. His wife reported patient having progressive weakness for the last few weeks, associated with decrease ambulatory state and somnolence. CXR revealed right base pleural calcifcations, right lower lobe interstitial infiltrate, left upper lobe interstitial infiltrate. Per family report, he was receiving [place] SLP and was on thickened liquids. Type of Study: Bedside Swallow Evaluation Previous Swallow Assessment: none found in chart Diet Prior to this Study: Dysphagia 1 (puree);Nectar-thick liquids Temperature Spikes Noted: No Respiratory Status: Room air History of Recent Intubation: No Behavior/Cognition: Alert;Cooperative;Pleasant mood;Lethargic/Drowsy Oral Cavity Assessment: Within Functional Limits Oral Care Completed by SLP: No Oral Cavity - Dentition: Adequate natural dentition Vision: Impaired for self-feeding Self-Feeding Abilities: Needs set up;Needs assist Patient Positioning: Upright in bed Baseline Vocal Quality: Low vocal intensity Volitional Cough: Weak Volitional Swallow: Able to elicit    Oral/Motor/Sensory Function Overall Oral Motor/Sensory Function: Generalized oral weakness   Ice Chips     Thin Liquid Thin Liquid: Not tested    Nectar Thick Nectar Thick Liquid: Impaired Oral phase functional implications: Prolonged oral  transit   Honey Thick     Puree Puree: Impaired Oral Phase Functional Implications: Prolonged oral transit   Solid     Solid: Not tested     Sonia Baller, MA, CCC-SLP Speech Therapy

## 2021-02-10 DIAGNOSIS — L8990 Pressure ulcer of unspecified site, unspecified stage: Secondary | ICD-10-CM | POA: Insufficient documentation

## 2021-02-10 MED ORDER — ORAL CARE MOUTH RINSE
15.0000 mL | Freq: Two times a day (BID) | OROMUCOSAL | Status: DC
  Administered 2021-02-11 – 2021-02-13 (×6): 15 mL via OROMUCOSAL

## 2021-02-10 MED ORDER — CHLORHEXIDINE GLUCONATE 0.12 % MT SOLN
15.0000 mL | Freq: Two times a day (BID) | OROMUCOSAL | Status: DC
  Administered 2021-02-10 – 2021-02-13 (×5): 15 mL via OROMUCOSAL
  Filled 2021-02-10 (×6): qty 15

## 2021-02-10 MED ORDER — SODIUM CHLORIDE 0.9 % IV SOLN: INTRAVENOUS | Status: DC | PRN

## 2021-02-10 NOTE — Progress Notes (Signed)
Pharmacy Antibiotic Note  Chad Russell is a 87 y.o. male admitted on 02/06/2021 with  aspiration PNA .  Pharmacy has been consulted for ampicillin/sulbactam dosing.  Today, 02/10/21 -WBC remains WNL, remains afebrile -Renal function WNL  Plan: Ampicillin/sulbactam 3 g IV q6h Follow renal function   Height: 6' (182.9 cm) Weight: 77.2 kg (170 lb 3.1 oz) IBW/kg (Calculated) : 77.6  Temp (24hrs), Avg:97.9 F (36.6 C), Min:97.4 F (36.3 C), Max:98.4 F (36.9 C)  Recent Labs  Lab 02/06/21 1718 02/06/21 1932 02/07/21 0447 02/08/21 0402 02/09/21 0416  WBC 7.3  --  7.4 10.1 9.9  CREATININE 1.18  --  0.91 0.90 0.87  LATICACIDVEN 1.8 1.3  --   --   --      Estimated Creatinine Clearance: 59.2 mL/min (by C-G formula based on SCr of 0.87 mg/dL).    Allergies  Allergen Reactions   Doxycycline Other (See Comments)    Upset stomach   Keflex [Cephalexin] Other (See Comments)    Abdominal discomfort 04-19-17 Pt has tolerated orally    Thank you for allowing pharmacy to be a part of this patients care.  Gretta Arab PharmD, BCPS Clinical Pharmacist [facility] main pharmacy [phone] 02/10/2021 11:11 AM

## 2021-02-10 NOTE — Plan of Care (Addendum)

## 2021-02-10 NOTE — Progress Notes (Signed)
Dysphagia  Patient Details Name: Chad Russell MRN: [id number] DOB: [birth year] Today's Date: 02/10/2021 Time: 6720-9470 SLP Time Calculation (min) (ACUTE ONLY): 21 min  Assessment / Plan / Recommendation Clinical Impression  Pt today seen today for dysphagia treatment goals.  RN reports good tolerance of po intake.  SLP observed him with intake of pureed potatoes and beef, nectar thick tea and thin water *at his bedside table.   No overt indication of aspiration - delayed swallow apparent. Per prior notes, pt was on nectar liquids PTA - SLP recommends to allow him thin water between meals - but thickened liquids with ALL other intake.  Updated sign at [facility].  Pt will need assist to eat - and his swallow will need to be observed via laryngeal elevation as his delay can be significant *(suspect due to Parkinson's and dement).  He can hold his own cup however.  Recommend continue current diet to decrease burden of care on caregivers.  No family present to educate.    HPI HPI: Patient is a 87 y.o. male with PMH: persistent atrial fibrillation, bradycardia, parkinson's disease, dementia, and HTN who presented with generalized weakness. His wife reported patient having progressive weakness for the last few weeks, associated with decrease ambulatory state and somnolence. CXR revealed right base pleural calcifcations, right lower lobe interstitial infiltrate, left upper lobe interstitial infiltrate. Per family report, he was receiving [place] SLP and was on thickened liquids.      SLP Plan  Continue with current plan of care      Recommendations for follow up therapy are one component of a multi-disciplinary discharge planning process, led by the attending physician.  Recommendations may be updated based on patient status, additional functional criteria and insurance authorization.    Recommendations  Diet recommendations: Nectar-thick liquid;Dysphagia 1 (puree);Other(comment) (thin water between meals  without other intake) Medication Administration: Whole meds with puree Compensations: Slow rate;Small sips/bites Postural Changes and/or Swallow Maneuvers: Seated upright 90 degrees;Upright 30-60 min after meal                Oral Care Recommendations: Oral care BID;Staff/trained caregiver to provide oral care Follow Up Recommendations: Skilled nursing-short term rehab (<3 hours/day) Assistance recommended at discharge: Frequent or constant Supervision/Assistance SLP Visit Diagnosis: Dysphagia, unspecified (R13.10) Plan: Continue with current plan of care          Kathleen Lime, MS [place] Office [phone] Pager [phone]  Macario Golds  02/10/2021, 12:25 PM

## 2021-02-10 NOTE — Progress Notes (Signed)
PROGRESS NOTE    REA RESER  NOB:[id number] DOB: [birth year] DOA: 02/06/2021 PCP: [name], MD    Brief Narrative:  Chad Russell was admitted to the hospital with the working diagnosis of community acquired pneumonia    87 yo male with the past medical history of persistent atrial fibrillation, bradycardia, parkinson's disease, dementia, and HTN who presented with generalized weakness. His wife reported Chad Russell having progressive weakness for the last few weeks, associated with decrease ambulatory state and somnolence. On his initial physical examination his blood pressure was 137/64, HR 40 to 46, RR 17 to 24 and oxygen saturation 100% on room air. Heart S1 and S2 bradycardic, lungs with no wheezing or rhonchi, abdomen soft and non tender, no lower extremity edema. Positive dementia, poorly responsive.    Sodium 137, potassium 4.3, chloride 105, bicarb 26, glucose 132, BUN 38, creatinine 1.18, CK6 119, white count 7.3, hemoglobin 12.7, hematocrit 39.9, platelets 225. SARS COVID-19 negative.   Urinalysis specific gravity > 1.046, 30 protein, 11-20 white cells,> 50 red cells.   CT pelvis with some subcutaneous edema.   Chest radiograph with right base pleural calcifications, right lower lobe interstitial infiltrate, left upper lobe interstitial infiltrate.   EKG 39 bpm, normal axis, right bundle branch block, atrial flutter rhythm with PVC, no significant St segment or T wave changes.    Chad Russell was placed on broad spectrum IV antibiotic therapy.    Clinically improving, but continue to be very weak and deconditioned, plan to transfer to SNF.    Assessment & Plan:   Principal Problem:   CAP (community acquired pneumonia) Active Problems:   HTN (hypertension)   Atrial fibrillation (HCC)   Generalized weakness   Acute metabolic encephalopathy   Dehydration   Acute prerenal azotemia   Parkinson disease (HCC)   Protein calorie malnutrition (HCC)   Pressure injury of  skin     Right lower lobe and left upper lobe community acquired pneumonia, aspiration pneumonia.  Advance Parkinson's disease with rapid decline in physical functional capacity at home.  His mentation continue to improve, he has been afebrile, no blood work for today.    Antibiotic therapy with Unasyn Follow up on cell count in am.  Continue with dysphagia one diet.    2. Parkinson's disease, with dementia, ambulatory dysfunction and multiple pressure sores, bilateral buttocks stage 2 present on admission.  Acute metabolic encephalopathy  Decreased vision due to macular degeneration,    Continue to improve mentation but continue to be very weak and deconditioned  Continue with PT and OT    3. HTN.  Continue with lisinopril for blood pressure control    4. Atrial flutter with bradycardia (chronic).  Continue anticoagulation with rivaroxaban His heart rate has been in the 35 to 75, ekg today personally reviewed with atrial flutter. Will continue telemetry monitoring.  His blood pressure has been stable.    5. Protein calorie protein malnutrition. (moderate)/ multiple pressure ulcers, tibial not able to stage, sacrum not able to stage , buttock stage 2, right foot stage 2.  On Nutritional supplements.  Continue with local skin care, per wound care team.      Chad Russell continue to be at high risk for worsening pneumonia   Status is: Inpatient  Remains inpatient appropriate because: IV antibiotic therapy    DVT prophylaxis: Rivaroxaban   Code Status:    full  Family Communication:  No family at the bedside      Nutrition Status: Nutrition Problem: Increased  nutrient needs Etiology: acute illness Signs/Symptoms: estimated needs Interventions: Hormel Shake, MVI, Prostat     Skin Documentation: Pressure Injury 02/08/21 Sacrum Medial Unstageable - Full thickness tissue loss in which the base of the injury is covered by slough (yellow, tan, gray, green or brown) and/or  eschar (tan, brown or black) in the wound bed. (Active)  02/08/21 1455  Location: Sacrum  Location Orientation: Medial  Staging: Unstageable - Full thickness tissue loss in which the base of the injury is covered by slough (yellow, tan, gray, green or brown) and/or eschar (tan, brown or black) in the wound bed.  Wound Description (Comments):   Present on Admission: Yes     Pressure Injury 02/09/21 Tibial Distal;Posterior;Right Deep Tissue Pressure Injury - Purple or maroon localized area of discolored intact skin or blood-filled blister due to damage of underlying soft tissue from pressure and/or shear. (Active)  02/09/21 1456  Location: Tibial  Location Orientation: Distal;Posterior;Right  Staging: Deep Tissue Pressure Injury - Purple or maroon localized area of discolored intact skin or blood-filled blister due to damage of underlying soft tissue from pressure and/or shear.  Wound Description (Comments):   Present on Admission: Yes     Pressure Injury 02/09/21 Foot Anterior;Right Stage 2 -  Partial thickness loss of dermis presenting as a shallow open injury with a red, pink wound bed without slough. (Active)  02/09/21 1456  Location: Foot  Location Orientation: Anterior;Right  Staging: Stage 2 -  Partial thickness loss of dermis presenting as a shallow open injury with a red, pink wound bed without slough.  Wound Description (Comments):   Present on Admission: Yes     Pressure Injury 02/08/21 Buttocks Right;Upper Stage 2 -  Partial thickness loss of dermis presenting as a shallow open injury with a red, pink wound bed without slough. (Active)  02/08/21 1456  Location: Buttocks  Location Orientation: Right;Upper  Staging: Stage 2 -  Partial thickness loss of dermis presenting as a shallow open injury with a red, pink wound bed without slough.  Wound Description (Comments):   Present on Admission: Yes    Antimicrobials:  Zosyn     Subjective: Chad Russell is resting in bed, open his  eyes to voice and responds to simple questions, no dyspnea or chest pain, no nausea or vomiting.   Objective: Vitals:   02/10/21 0500 02/10/21 0532 02/10/21 0956 02/10/21 1346  BP:  (!) 174/76 (!) 149/65 (!) 165/67  Pulse:  (!) 40 (!) 35 68  Resp:  20 16 20   Temp:  97.8 F (36.6 C)  97.6 F (36.4 C)  TempSrc:  Oral  Oral  SpO2:  100% 97% 97%  Weight: 77.2 kg     Height:        Intake/Output Summary (Last 24 hours) at 02/10/2021 1645 Last data filed at 02/10/2021 0700 Gross per 24 hour  Intake 490 ml  Output 500 ml  Net -10 ml   Filed Weights   02/06/21 1707 02/09/21 0300 02/10/21 0500  Weight: 77 kg 76.2 kg 77.2 kg    Examination:   General: Not in pain or dyspnea, deconditioned  Neurology: somnolent but easy to arouse  E ENT: no pallor, no icterus, oral mucosa moist Cardiovascular: No JVD. S1-S2 present, rhythmic, no gallops, rubs, or murmurs. No lower extremity edema. Pulmonary: positive breath sounds bilaterally,  with no wheezing, or rhonchi mild rales at the dependent zones. Gastrointestinal. Abdomen soft and non tender Skin. No rashes Musculoskeletal: no joint deformities  Data Reviewed: I have personally reviewed following labs and imaging studies  CBC: Recent Labs  Lab 02/06/21 1718 02/07/21 0447 02/08/21 0402 02/09/21 0416  WBC 7.3 7.4 10.1 9.9  NEUTROABS 4.6 4.3  --   --   HGB 12.7* 11.9* 12.5* 11.5*  HCT 39.9 37.3* 38.9* 35.5*  MCV 96.8 95.6 94.6 93.9  PLT 225 212 237 071   Basic Metabolic Panel: Recent Labs  Lab 02/06/21 1718 02/07/21 0447 02/08/21 0402 02/09/21 0416  NA 137 136 137 140  K 4.3 4.0 4.0 3.7  CL 105 105 108 109  CO2 26 25 24 25   GLUCOSE 132* 100* 103* 99  BUN 38* 36* 29* 27*  CREATININE 1.18 0.91 0.90 0.87  CALCIUM 8.9 8.5* 8.3* 8.4*  MG 2.4 2.1  --   --   PHOS  --  2.8  --   --    GFR: Estimated Creatinine Clearance: 59.2 mL/min (by C-G formula based on SCr of 0.87 mg/dL). Liver Function Tests: Recent Labs   Lab 02/06/21 1718 02/07/21 0447  AST 37 40  ALT 17 23  ALKPHOS 82 70  BILITOT 0.5 0.6  PROT 6.8 6.0*  ALBUMIN 3.0* 2.8*   No results for input(s): LIPASE, AMYLASE in the last 168 hours. Recent Labs  Lab 02/07/21 0447  AMMONIA 19   Coagulation Profile: Recent Labs  Lab 02/06/21 1718  INR 1.9*   Cardiac Enzymes: Recent Labs  Lab 02/06/21 1718  CKTOTAL 619*   BNP (last 3 results) No results for input(s): PROBNP in the last 8760 hours. HbA1C: No results for input(s): HGBA1C in the last 72 hours. CBG: Recent Labs  Lab 02/09/21 2122  GLUCAP 125*   Lipid Profile: No results for input(s): CHOL, HDL, LDLCALC, TRIG, CHOLHDL, LDLDIRECT in the last 72 hours. Thyroid Function Tests: No results for input(s): TSH, T4TOTAL, FREET4, T3FREE, THYROIDAB in the last 72 hours. Anemia Panel: No results for input(s): VITAMINB12, FOLATE, FERRITIN, TIBC, IRON, RETICCTPCT in the last 72 hours.    Radiology Studies: I have reviewed all of the imaging during this hospital visit personally     Scheduled Meds:  (feeding supplement) PROSource Plus  30 mL Oral BID BM   carbidopa-levodopa  1.5 tablet Oral BID PC   collagenase   Topical Daily   multivitamin with minerals  1 tablet Oral Daily   rivaroxaban  20 mg Oral Q supper   timolol  1 drop Both Eyes BID   Continuous Infusions:  ampicillin-sulbactam (UNASYN) IV 3 g (02/10/21 1319)     LOS: 4 days        Chad Russell Gerome Apley, MD

## 2021-02-11 LAB — CULTURE, BLOOD (ROUTINE X 2)
Culture: NO GROWTH
Culture: NO GROWTH
Special Requests: ADEQUATE

## 2021-02-11 LAB — CBC
HCT: 33.2 % — ABNORMAL LOW (ref 39.0–52.0)
Hemoglobin: 10.9 g/dL — ABNORMAL LOW (ref 13.0–17.0)
MCH: 30.6 pg (ref 26.0–34.0)
MCHC: 32.8 g/dL (ref 30.0–36.0)
MCV: 93.3 fL (ref 80.0–100.0)
Platelets: 282 10*3/uL (ref 150–400)
RBC: 3.56 MIL/uL — ABNORMAL LOW (ref 4.22–5.81)
RDW: 14.6 % (ref 11.5–15.5)
WBC: 8.1 10*3/uL (ref 4.0–10.5)
nRBC: 0 % (ref 0.0–0.2)

## 2021-02-11 LAB — BASIC METABOLIC PANEL
Anion gap: 6 (ref 5–15)
BUN: 24 mg/dL — ABNORMAL HIGH (ref 8–23)
CO2: 26 mmol/L (ref 22–32)
Calcium: 8.5 mg/dL — ABNORMAL LOW (ref 8.9–10.3)
Chloride: 108 mmol/L (ref 98–111)
Creatinine, Ser: 0.66 mg/dL (ref 0.61–1.24)
GFR, Estimated: >60 mL/min (ref >60)
Glucose, Bld: 101 mg/dL — ABNORMAL HIGH (ref 70–99)
Potassium: 3.7 mmol/L (ref 3.5–5.1)
Sodium: 140 mmol/L (ref 135–145)

## 2021-02-11 LAB — GLUCOSE, CAPILLARY: Glucose-Capillary: 86 mg/dL (ref 70–99)

## 2021-02-11 LAB — METHYLMALONIC ACID, SERUM: Methylmalonic Acid, Quantitative: 210 nmol/L (ref 0–378)

## 2021-02-11 MED ORDER — AMOXICILLIN-POT CLAVULANATE 875-125 MG PO TABS
1.0000 | ORAL_TABLET | Freq: Two times a day (BID) | ORAL | Status: DC
Start: 2021-02-11 — End: 2021-02-14
  Administered 2021-02-11 – 2021-02-13 (×6): 1 via ORAL
  Filled 2021-02-11 (×6): qty 1

## 2021-02-11 NOTE — Progress Notes (Signed)
Occupational Therapy Treatment Patient Details Name: Chad Russell MRN: [id number] DOB: 1926-09-23 Today's Date: 02/11/2021   History of present illness Pt is 87 yo male who presented to [facility] on 02/06/21 with generalized weakness and CAP.  Pt with hx including but not limited to  persistent atrial fibrillation chronically anticoagulated on Xarelto, chronic bradycardia, Parkinson's disease complicated by dementia, hypertension, Bil hip hemiarthroplasty.   OT comments  Patient exhibits some ability to initiate movement in lower extremities - however still requiring max x 2 for bed transfer and transfer to recliner. With assist patient able to hold cup and drink juice and with set up patient able to wash face. Patient pleasant and able to follow commands. Continue to recommend short term rehab at discharge.   Recommendations for follow up therapy are one component of a multi-disciplinary discharge planning process, led by the attending physician.  Recommendations may be updated based on patient status, additional functional criteria and insurance authorization.    Follow Up Recommendations  Skilled nursing-short term rehab (<3 hours/day)    Assistance Recommended at Discharge Frequent or constant Supervision/Assistance  Equipment Recommendations  None recommended by OT    Recommendations for Other Services      Precautions / Restrictions Precautions Precautions: Fall Precaution Comments: pressure wound on sacrum, right ankle wound Restrictions Weight Bearing Restrictions: No       Mobility Bed Mobility Overal bed mobility: Needs Assistance Bed Mobility: Supine to Sit     Supine to sit: Max assist;+2 for physical assistance     General bed mobility comments: Max x 2 to transfer to side of bed - initiated moving LEs to the right but had difficulty.    Transfers Overall transfer level: Needs assistance Equipment used: Rolling walker (2 wheels) Transfers: Sit to/from  Stand;Bed to chair/wheelchair/BSC Sit to Stand: Max assist;+2 physical assistance;From elevated surface           General transfer comment: Max x 2 to stand from elevated bed height with patient holding onto walker - though use of walker ineffective. Assistance needed to weight shift with patient exhibiting some ability to initiate steps but with mimimal movement requiring physical assistance. Full assistance to descend into chair.     Balance Overall balance assessment: Needs assistance Sitting-balance support: No upper extremity supported Sitting balance-Leahy Scale: Poor Sitting balance - Comments: requires external assistance Postural control: Posterior lean Standing balance support: Bilateral upper extremity supported Standing balance-Leahy Scale: Zero Standing balance comment: requires external assist                           ADL either performed or assessed with clinical judgement   ADL Overall ADL's : Needs assistance/impaired Eating/Feeding: Maximal assistance Eating/Feeding Details (indicate cue type and reason): therapist assisted patient with holding juice cup in hand - encouragement to raise hand higher. patient able to drink from straw Grooming: Set up;Wash/dry face Grooming Details (indicate cue type and reason): washed face with setup         Upper Body Dressing : Total assistance;Bed level Upper Body Dressing Details (indicate cue type and reason): to don socks                        Extremity/Trunk Assessment              Vision Baseline Vision/History: 2 Legally blind     Perception     Praxis      Cognition  Arousal/Alertness: Awake/alert Behavior During Therapy: WFL for tasks assessed/performed Overall Cognitive Status: Within Functional Limits for tasks assessed                                            Exercises Other Exercises Other Exercises: encouarged hip flexion and upper extremity movement  with active assist   Shoulder Instructions       General Comments      Pertinent Vitals/ Pain       Pain Assessment: Faces Faces Pain Scale: Hurts a little bit Pain Location: sacrum Pain Descriptors / Indicators: Burning;Grimacing Pain Intervention(s): Limited activity within patient's tolerance  Home Living                                          Prior Functioning/Environment              Frequency  Min 2X/week        Progress Toward Goals  OT Goals(current goals can now be found in the care plan section)  Progress towards OT goals: Progressing toward goals  Acute Rehab OT Goals Patient Stated Goal: to go to rehab and walker again OT Goal Formulation: With family Time For Goal Achievement: 02/21/21 Potential to Achieve Goals: [place] Discharge plan remains appropriate    Co-evaluation                 AM-PAC OT "6 Clicks" Daily Activity     Outcome Measure   Help from another person eating meals?: A Lot Help from another person taking care of personal grooming?: A Lot Help from another person toileting, which includes using toliet, bedpan, or urinal?: Total Help from another person bathing (including washing, rinsing, drying)?: Total Help from another person to put on and taking off regular upper body clothing?: Total Help from another person to put on and taking off regular lower body clothing?: Total 6 Click Score: 8    End of Session Equipment Utilized During Treatment: Gait belt;Rolling walker (2 wheels)  OT Visit Diagnosis: Unsteadiness on feet (R26.81);Other abnormalities of gait and mobility (R26.89);Pain;Muscle weakness (generalized) (M62.81)   Activity Tolerance Patient tolerated treatment well   Patient Left in chair;with call bell/phone within reach;with chair alarm set   Nurse Communication Mobility status        Time: 8242-3536 OT Time Calculation (min): 27 min  Charges: OT General Charges $OT Visit:  1 Visit OT Treatments $Self Care/Home Management : 8-22 mins $Therapeutic Activity: 8-22 mins  Izell Labat, OTR/L [place]  Office [phone] Pager: [place] 02/11/2021, 10:58 AM

## 2021-02-11 NOTE — TOC Progression Note (Addendum)
Transition of Care [facility]) - Progression Note    Patient Details  Name: TOBE KERVIN MRN: [id number] Date of Birth: 11/04/1926  [facility] CM/SW Contact  Leeroy Cha, RN Phone Number: 02/11/2021, 8:44 AM  Clinical Narrative:    Ursula Alert at [facility] requested call back [facility] unable to take/tct-wife please try [facility].  Expected Discharge Plan: [place] Barriers to Discharge: Continued Medical Work up  Expected Discharge Plan and Services Expected Discharge Plan: [place]   Discharge Planning Services: CM Consult Post Acute Care Choice: [place] Living arrangements for the past 2 months: Single Family Home                                       Social Determinants of Health (SDOH) Interventions    Readmission Risk Interventions No flowsheet data found.

## 2021-02-11 NOTE — Progress Notes (Signed)
PROGRESS NOTE    ZELL HYLTON  NID:[id number] DOB: 03-Aug-1926 DOA: 02/06/2021 PCP: [name], MD    Brief Narrative:  Mr. Uppal was admitted to the hospital with the working diagnosis of community acquired pneumonia    87 yo male with the past medical history of persistent atrial fibrillation, bradycardia, parkinson's disease, dementia, and HTN who presented with generalized weakness. His wife reported patient having progressive weakness for the last few weeks, associated with decrease ambulatory state and somnolence. On his initial physical examination his blood pressure was 137/64, HR 40 to 46, RR 17 to 24 and oxygen saturation 100% on room air. Heart S1 and S2 bradycardic, lungs with no wheezing or rhonchi, abdomen soft and non tender, no lower extremity edema. Positive dementia, poorly responsive.    Sodium 137, potassium 4.3, chloride 105, bicarb 26, glucose 132, BUN 38, creatinine 1.18, CK6 119, white count 7.3, hemoglobin 12.7, hematocrit 39.9, platelets 225. SARS COVID-19 negative.   Urinalysis specific gravity > 1.046, 30 protein, 11-20 white cells,> 50 red cells.   CT pelvis with some subcutaneous edema.   Chest radiograph with right base pleural calcifications, right lower lobe interstitial infiltrate, left upper lobe interstitial infiltrate.   EKG 39 bpm, normal axis, right bundle branch block, atrial flutter rhythm with PVC, no significant St segment or T wave changes.    Patient was placed on broad spectrum IV antibiotic therapy.    Clinically improving, but continue to be very weak and deconditioned, plan to transfer to SNF.      Assessment & Plan:   Principal Problem:   CAP (community acquired pneumonia) Active Problems:   HTN (hypertension)   Atrial fibrillation (HCC)   Generalized weakness   Acute metabolic encephalopathy   Dehydration   Acute prerenal azotemia   Parkinson disease (HCC)   Protein calorie malnutrition (HCC)   Pressure injury of  skin   Right lower lobe and left upper lobe community acquired pneumonia, aspiration pneumonia.  Advance Parkinson's disease with rapid decline in physical functional capacity at home.     Plan to completed antibiotic therapy with Augmentin for a total of 8 days for aspiration pneumonia Continue aspiration precautions and a dysphagia 1 diet.    2. Parkinson's disease, with dementia, ambulatory dysfunction and multiple pressure sores, bilateral buttocks stage 2 present on admission.  Acute metabolic encephalopathy  Decreased vision due to macular degeneration,    Mentation continue to improve but patient is very weak and deconditioned, he keeps his eyes closed and responds to simple questions Pending transfer to SNF for further therapy    3. HTN.  Blood pressure control with lisinopril.    4. Atrial flutter with bradycardia (chronic). complete heart block Anticoagulation with rivaroxaban Continue to have atrial flutter with bradycardia. Patient follows up with cardiology as out patient.  Old records personally reviewed, office visit from 12/2020. Pacemaker is not being considered due to his frailty.  Continue telemetry monitoring during his hospitalization    5. Protein calorie protein malnutrition. (moderate)/ multiple pressure ulcers, tibial not able to stage, sacrum not able to stage , buttock stage 2, right foot stage 2.  Continue with nutritional supplements.  Continue with local skin care, per wound care team.      Status is: Inpatient  Remains inpatient appropriate because: antibiotic therapy, pending transfer to SNF    DVT prophylaxis:  Rivaroxaban   Code Status:    full  Family Communication:  I spoke with patient's wife at the bedside,  we talked in detail about patient's condition, plan of care and prognosis and all questions were addressed. We talked about advance directives, patient with progressive neurologic disease, swallow dysfunction and chronic heart block.  Poor prognosis and high risk for medical complications and recurrent infections.  DNR was recommended, she will think about that and let the medical team know her decision     Nutrition Status: Nutrition Problem: Increased nutrient needs Etiology: acute illness Signs/Symptoms: estimated needs Interventions: Hormel Shake, MVI, Prostat     Skin Documentation: Pressure Injury 02/08/21 Sacrum Medial Unstageable - Full thickness tissue loss in which the base of the injury is covered by slough (yellow, tan, gray, green or brown) and/or eschar (tan, brown or black) in the wound bed. (Active)  02/08/21 1455  Location: Sacrum  Location Orientation: Medial  Staging: Unstageable - Full thickness tissue loss in which the base of the injury is covered by slough (yellow, tan, gray, green or brown) and/or eschar (tan, brown or black) in the wound bed.  Wound Description (Comments):   Present on Admission: Yes     Pressure Injury 02/09/21 Tibial Distal;Posterior;Right Deep Tissue Pressure Injury - Purple or maroon localized area of discolored intact skin or blood-filled blister due to damage of underlying soft tissue from pressure and/or shear. (Active)  02/09/21 1456  Location: Tibial  Location Orientation: Distal;Posterior;Right  Staging: Deep Tissue Pressure Injury - Purple or maroon localized area of discolored intact skin or blood-filled blister due to damage of underlying soft tissue from pressure and/or shear.  Wound Description (Comments):   Present on Admission: Yes     Pressure Injury 02/09/21 Foot Anterior;Right Stage 2 -  Partial thickness loss of dermis presenting as a shallow open injury with a red, pink wound bed without slough. (Active)  02/09/21 1456  Location: Foot  Location Orientation: Anterior;Right  Staging: Stage 2 -  Partial thickness loss of dermis presenting as a shallow open injury with a red, pink wound bed without slough.  Wound Description (Comments):   Present on  Admission: Yes     Pressure Injury 02/08/21 Buttocks Right;Upper Stage 2 -  Partial thickness loss of dermis presenting as a shallow open injury with a red, pink wound bed without slough. (Active)  02/08/21 1456  Location: Buttocks  Location Orientation: Right;Upper  Staging: Stage 2 -  Partial thickness loss of dermis presenting as a shallow open injury with a red, pink wound bed without slough.  Wound Description (Comments):   Present on Admission: Yes    Antimicrobials:  Augmentin     Subjective:  Patient very weak and deconditioned, he opens eyes and answers simple questions  Objective: Vitals:   02/10/21 2117 02/10/21 2200 02/11/21 0457 02/11/21 1351  BP: (!) 177/78  (!) 150/70 (!) 153/73  Pulse: (!) 40 (!) 56 (!) 40 (!) 36  Resp: 20  20 18   Temp: 98.3 F (36.8 C)  98 F (36.7 C) 98.5 F (36.9 C)  TempSrc: Oral  Axillary Oral  SpO2: 98%  95% 96%  Weight:      Height:        Intake/Output Summary (Last 24 hours) at 02/11/2021 1424 Last data filed at 02/11/2021 1242 Gross per 24 hour  Intake 1330.88 ml  Output 900 ml  Net 430.88 ml   Filed Weights   02/06/21 1707 02/09/21 0300 02/10/21 0500  Weight: 77 kg 76.2 kg 77.2 kg    Examination:   General: deconditioned  Neurology: eyes closed but able to answer simple  questions   E ENT: positive pallor, no icterus, oral mucosa moist Cardiovascular: No JVD. S1-S2 present, rhythmic, no gallops, rubs, or murmurs. No lower extremity edema. Pulmonary: positive breath sounds bilaterally, with wheezing, rhonchi or rales. Anterior auscultation  Gastrointestinal. Abdomen soft and non tender Skin. No rashes Musculoskeletal: no joint deformities     Data Reviewed: I have personally reviewed following labs and imaging studies  CBC: Recent Labs  Lab 02/06/21 1718 02/07/21 0447 02/08/21 0402 02/09/21 0416 02/11/21 0515  WBC 7.3 7.4 10.1 9.9 8.1  NEUTROABS 4.6 4.3  --   --   --   HGB 12.7* 11.9* 12.5* 11.5* 10.9*   HCT 39.9 37.3* 38.9* 35.5* 33.2*  MCV 96.8 95.6 94.6 93.9 93.3  PLT 225 212 237 226 619   Basic Metabolic Panel: Recent Labs  Lab 02/06/21 1718 02/07/21 0447 02/08/21 0402 02/09/21 0416 02/11/21 0515  NA 137 136 137 140 140  K 4.3 4.0 4.0 3.7 3.7  CL 105 105 108 109 108  CO2 26 25 24 25 26   GLUCOSE 132* 100* 103* 99 101*  BUN 38* 36* 29* 27* 24*  CREATININE 1.18 0.91 0.90 0.87 0.66  CALCIUM 8.9 8.5* 8.3* 8.4* 8.5*  MG 2.4 2.1  --   --   --   PHOS  --  2.8  --   --   --    GFR: Estimated Creatinine Clearance: 64.3 mL/min (by C-G formula based on SCr of 0.66 mg/dL). Liver Function Tests: Recent Labs  Lab 02/06/21 1718 02/07/21 0447  AST 37 40  ALT 17 23  ALKPHOS 82 70  BILITOT 0.5 0.6  PROT 6.8 6.0*  ALBUMIN 3.0* 2.8*   No results for input(s): LIPASE, AMYLASE in the last 168 hours. Recent Labs  Lab 02/07/21 0447  AMMONIA 19   Coagulation Profile: Recent Labs  Lab 02/06/21 1718  INR 1.9*   Cardiac Enzymes: Recent Labs  Lab 02/06/21 1718  CKTOTAL 619*   BNP (last 3 results) No results for input(s): PROBNP in the last 8760 hours. HbA1C: No results for input(s): HGBA1C in the last 72 hours. CBG: Recent Labs  Lab 02/09/21 2122 02/11/21 0726  GLUCAP 125* 86   Lipid Profile: No results for input(s): CHOL, HDL, LDLCALC, TRIG, CHOLHDL, LDLDIRECT in the last 72 hours. Thyroid Function Tests: No results for input(s): TSH, T4TOTAL, FREET4, T3FREE, THYROIDAB in the last 72 hours. Anemia Panel: No results for input(s): VITAMINB12, FOLATE, FERRITIN, TIBC, IRON, RETICCTPCT in the last 72 hours.    Radiology Studies: I have reviewed all of the imaging during this hospital visit personally     Scheduled Meds:  (feeding supplement) PROSource Plus  30 mL Oral BID BM   amoxicillin-clavulanate  1 tablet Oral Q12H   carbidopa-levodopa  1.5 tablet Oral BID PC   chlorhexidine  15 mL Mouth Rinse BID   collagenase   Topical Daily   mouth rinse  15 mL Mouth  Rinse q12n4p   multivitamin with minerals  1 tablet Oral Daily   rivaroxaban  20 mg Oral Q supper   timolol  1 drop Both Eyes BID   Continuous Infusions:  sodium chloride Stopped (02/10/21 2344)     LOS: 5 days        Cashay Manganelli Gerome Apley, MD

## 2021-02-11 NOTE — Progress Notes (Signed)
Physical Therapy Treatment Patient Details Name: Chad Russell MRN: [id number] DOB: 01/04/1927 Today's Date: 02/11/2021   History of Present Illness Pt is 87 yo male who presented to [facility] on 02/06/21 with generalized weakness and CAP.  Pt with hx including but not limited to  persistent atrial fibrillation chronically anticoagulated on Xarelto, chronic bradycardia, Parkinson's disease complicated by dementia, hypertension, Bil hip hemiarthroplasty.    PT Comments    Pt was OOB in recliner.  General Comments: AxO x 1 flat with few words but did ask for water twice and indicated his buttocks were hurting.  Assisted out of recliner back to bed was very difficult.  General transfer comment: pt required + 2 side by side MAX assist to partially rise form recliner.  Pt very rigid and present with B hip and knee flexion contractures.  Unable to stand upright. Used b platform EVA walker for increased support however pt unable to functionally support self and unable to weight shift effectively to complete 180 degree back to bed.  General Gait Details: attempted with + 2 assist and use of B platform EVA walker however pt was unable to achieve upright posture present with B hip and knee flexion and very narrow BOS.  Pt also unable to take any functional steps.  General bed mobility comments: Total Asisst + 2 back to bed sidelying with multiple pillows for comfort. Pt will need SNF level of care post D/C.   Recommendations for follow up therapy are one component of a multi-disciplinary discharge planning process, led by the attending physician.  Recommendations may be updated based on patient status, additional functional criteria and insurance authorization.  Follow Up Recommendations  Skilled nursing-short term rehab (<3 hours/day)     Assistance Recommended at Discharge Frequent or constant Supervision/Assistance  Equipment Recommendations       Recommendations for Other Services        Precautions / Restrictions Precautions Precautions: Fall Precaution Comments: Parkinsons     Mobility  Bed Mobility Overal bed mobility: Needs Assistance Bed Mobility: Sit to Supine       Sit to supine: Total assist;+2 for physical assistance   General bed mobility comments: Total Asisst + 2 back to bed sidelying with multiple pillows for comfort    Transfers Overall transfer level: Needs assistance Equipment used: Bilateral platform walker Transfers: Sit to/from Stand Sit to Stand: Max assist;+2 physical assistance;From elevated surface           General transfer comment: pt required + 2 side by side MAX assist to partially rise form recliner.  Pt very rigid and present with B hip and knee flexion contractures.  Unable to stand upright. Used b platform EVA walker for increased support however pt unable to functionally support self and unable to weight shift effectively to complete 180 degree back to bed.    Ambulation/Gait Ambulation/Gait assistance: Total assist;+2 physical assistance;+2 safety/equipment Gait Distance (Feet): 2 Feet Assistive device: Bilateral platform walker Gait Pattern/deviations: Trunk flexed;Scissoring;Shuffle;Narrow base of support;Decreased stance time - right;Decreased stance time - left;Step-to pattern Gait velocity: decreased     General Gait Details: attempted with + 2 assist and use of B platform EVA walker however pt was unable to achieve upright posture present with B hip and knee flexion and very narrow BOS.  Pt also unable to take any functional steps.   Stairs             Wheelchair Mobility    Modified Rankin (Stroke Patients Only)  Balance                                            Cognition Arousal/Alertness: Awake/alert Behavior During Therapy: WFL for tasks assessed/performed Overall Cognitive Status: Within Functional Limits for tasks assessed                                  General Comments: AxO x 1 flat with few words but did ask for water twice and indicated his buttocks were hurting        Exercises      General Comments        Pertinent Vitals/Pain Pain Assessment: Faces Faces Pain Scale: Hurts a little bit Pain Location: buttocks Pain Descriptors / Indicators: Discomfort;Grimacing Pain Intervention(s): Monitored during session;Repositioned    Home Living                          Prior Function            PT Goals (current goals can now be found in the care plan section) Progress towards PT goals: Progressing toward goals    Frequency    Min 2X/week      PT Plan Current plan remains appropriate    Co-evaluation              AM-PAC PT "6 Clicks" Mobility   Outcome Measure  Help needed turning from your back to your side while in a flat bed without using bedrails?: Total Help needed moving from lying on your back to sitting on the side of a flat bed without using bedrails?: Total Help needed moving to and from a bed to a chair (including a wheelchair)?: Total Help needed standing up from a chair using your arms (e.g., wheelchair or bedside chair)?: Total Help needed to walk in hospital room?: Total Help needed climbing 3-5 steps with a railing? : Total 6 Click Score: 6    End of Session Equipment Utilized During Treatment: Gait belt Activity Tolerance: Patient limited by fatigue Patient left: in bed;with call bell/phone within reach Nurse Communication: Mobility status;Other (comment) PT Visit Diagnosis: Other abnormalities of gait and mobility (R26.89);Muscle weakness (generalized) (M62.81)     Time: 6754-4920 PT Time Calculation (min) (ACUTE ONLY): 18 min  Charges:  $Gait Training: 8-22 mins                    Rica Koyanagi  PTA Acute  Rehabilitation Services Pager      [phone] Office      [phone]

## 2021-02-12 LAB — RESP PANEL BY RT-PCR (FLU A&B, COVID) ARPGX2
Influenza A by PCR: NEGATIVE
Influenza B by PCR: NEGATIVE
SARS Coronavirus 2 by RT PCR: NEGATIVE

## 2021-02-12 MED ORDER — AMOXICILLIN-POT CLAVULANATE 875-125 MG PO TABS: 1.0000 | ORAL_TABLET | Freq: Two times a day (BID) | ORAL | 0 refills | Status: AC

## 2021-02-12 NOTE — Care Management Important Message (Signed)
Important Message  Patient Details IM Letter placed in Patients room. Name: Chad Russell MRN: [id number] Date of Birth: December 22, 1926   Medicare Important Message Given:  Yes     Kerin Salen 02/12/2021, 10:55 AM

## 2021-02-12 NOTE — Plan of Care (Signed)
°  Problem: Education: Goal: Knowledge of General Education information will improve Description: Including pain rating scale, medication(s)/side effects and non-pharmacologic comfort measures Outcome: Progressing   Problem: Clinical Measurements: Goal: Respiratory complications will improve Outcome: Progressing Goal: Cardiovascular complication will be avoided Outcome: Progressing   Problem: Activity: Goal: Risk for activity intolerance will decrease Outcome: Not Progressing

## 2021-02-12 NOTE — TOC Transition Note (Addendum)
Transition of Care [facility]) - CM/SW Discharge Note   Patient Details  Name: Chad Russell MRN: [id number] Date of Birth: [birth year]  Transition [facility] Care [facility] CM/SW Contact:  Leeroy Cha, RN Phone Number: 02/12/2021, 2:25 PM   Clinical Narrative:    Will go to room 107B at [facility] number to call report is [phone] Dc summary faxed to [facility] necessity form and packet to the floor RN. Ptar called at [phone] Final next level of care: [place] Barriers to Discharge: Barriers Resolved   Patient Goals and CMS Choice Patient states their goals for this hospitalization and ongoing recovery are:: not stated CMS Medicare.gov Compare Post Acute Care list provided to:: Patient Represenative (must comment) (wife)    Discharge Placement                       Discharge Plan and Services   Discharge Planning Services: CM Consult Post Acute Care Choice: Skilled Nursing Facility                               Social Determinants of Health (SDOH) Interventions     Readmission Risk Interventions No flowsheet data found.

## 2021-02-12 NOTE — Discharge Summary (Addendum)
Physician Discharge Summary  Chad Russell IOE:[id number] DOB: Mar 23, 1926 DOA: 02/06/2021  PCP: [name], MD  Admit date: 02/06/2021 Discharge date: 02/13/2021  Admitted From: Home  Disposition:   SNF   Recommendations for Outpatient Follow-up and new medication changes:  Follow up with Dr. Carlota Raspberry in 7 to 10 days  Follow up with Cardiology as scheduled Continue antibiotic therapy with Augmentin for 3 more days Aspiration precautions   Home Health: na   Equipment/Devices: na    Discharge Condition: stable  CODE STATUS: full.   I spoke with his wife at the bedside about DNR, implications of resuscitation attempts and low chance of being successful in his condition. She has talked to him and they would like him to continue full code.   Diet recommendation:  dysphagia 1 diet with aspiration precautions.   Brief/Interim Summary: Chad Russell was admitted to the hospital with the working diagnosis of community acquired pneumonia    87 yo male with the past medical history of persistent atrial fibrillation/ flutter, complete heart block, parkinson's disease, and HTN who presented with generalized weakness. His wife reported patient having progressive weakness for the last few weeks, associated with decrease ambulatory state and somnolence. On his initial physical examination his blood pressure was 137/64, HR 40 to 46, RR 17 to 24 and oxygen saturation 100% on room air. Heart S1 and S2 bradycardic, lungs with no wheezing or rhonchi, abdomen soft and non tender, no lower extremity edema. Positive dementia, poorly responsive.    Sodium 137, potassium 4.3, chloride 105, bicarb 26, glucose 132, BUN 38, creatinine 1.18, CK6 119, white count 7.3, hemoglobin 12.7, hematocrit 39.9, platelets 225. SARS COVID-19 negative.   Urinalysis specific gravity > 1.046, 30 protein, 11-20 white cells,> 50 red cells.   CT pelvis with some subcutaneous edema.   Chest radiograph with right base  pleural calcifications, right lower lobe interstitial infiltrate, left upper lobe interstitial infiltrate.   EKG 39 bpm, normal axis, right bundle branch block, atrial flutter rhythm with PVC, no significant St segment or T wave changes.    Patient was placed on broad spectrum IV antibiotic therapy.    Clinically improving, but continue to be very weak and deconditioned, plan to transfer to SNF.   Right lower lobe and left upper lobe community acquired pneumonia, aspiration pneumonia.   Patient was admitted to the telemetry ward, he was initially treated with broad-spectrum antibiotic with intravenous Unasyn.  Speech evaluation recommended dysphagia 1 diet.  His condition slowly improved and he was transitioned to oral Augmentin. Continue aspiration precautions.  2.  Parkinson's disease, dementia, ambulatory dysfunction, multiple pressure sores, bilateral buttock stage II, present admission, acute metabolic encephalopathy.  Patient with progressive decline in his functional physical capacity, he has decreased vision due to macular degeneration. With treatment of his pneumonia his mentation improved back to baseline. He continued to be very weak and deconditioned, to continue therapy at a skilled nursing facility.  Continue Sinemet and outpatient follow-up.  3.  Hypertension.  Continue blood pressure control with lisinopril.  4.  Atrial flutter with bradycardia, chronic complete heart block.  Patient has been anticoagulated with rivaroxaban.  He remained bradycardic on telemetry monitor.  His blood pressure remained stable. He follows up with cardiology as an outpatient, last visit from November 2022, pacemaker is not being recommended due to patient being frail.  Continue outpatient cardiology follow-up.  5.  Protein calorie malnutrition, moderate.  Multiple pressure ulcers, not able to stage, sacral mandible stage, buttock  stage II, right foot stage II, tibial not able to stage. Patient  was consulted with wound care, dressing changes were recommended. Continue nutritional supplements.  Discharge Diagnoses:  Principal Problem:   CAP (community acquired pneumonia) Active Problems:   HTN (hypertension)   Atrial fibrillation (HCC)   Generalized weakness   Acute metabolic encephalopathy   Dehydration   Acute prerenal azotemia   Parkinson disease (HCC)   Protein calorie malnutrition (HCC)   Pressure injury of skin    Discharge Instructions  Discharge Instructions     Diet - low sodium heart healthy   Complete by: As directed    Discharge instructions   Complete by: As directed    Please follow up with primary care in 7 to 10 days   Discharge wound care:   Complete by: As directed    Silicone foam dressings to the right achilles wound and right dorsal foot blisters change every 3 days. Assess under dressings each shift for any acute changes in the wounds. Wound care to unstageable pressure injury to coccycx cleanse with NS and pat dry. Apply 1/8 inch layer of collagenase (Santyl) to the black non visible tissue, top with saline dampened gauze 2x2, cover with dry gauze 2x2s and secure with a silicone foam. Apply collagenase daily. Replace PRN soiling. Wound care to left lateral malleolus. Unstageable pressure injury: cleanse with NS, pat dry. Cover with folded piece of xeroform gauze, Lawson #[id number]. Top with dry gauze 4x4s and secure with a few turns of Kerlix roll gauze/ paper tape. Place foot/ feet into Prevalon boots, wound care every shift.   Increase activity slowly   Complete by: As directed       Allergies as of 02/12/2021       Reactions   Doxycycline Other (See Comments)   Upset stomach   Keflex [cephalexin] Other (See Comments)   Abdominal discomfort 04-19-17 Pt has tolerated orally        Medication List     STOP taking these medications    HYDROcodone-acetaminophen 5-325 MG tablet Commonly known as: Norco       TAKE these medications     acetaminophen 500 MG tablet Commonly known as: TYLENOL Take 1 tablet (500 mg total) by mouth every 8 (eight) hours. What changed:  when to take this reasons to take this   amoxicillin-clavulanate 875-125 MG tablet Commonly known as: AUGMENTIN Take 1 tablet by mouth every 12 (twelve) hours for 3 days.   calcium carbonate 500 MG chewable tablet Commonly known as: TUMS - dosed in mg elemental calcium Chew 1 tablet (200 mg of elemental calcium total) by mouth 2 (two) times daily with a meal. What changed:  when to take this reasons to take this   carbidopa-levodopa 25-100 MG tablet Commonly known as: Sinemet Take 1.5 tablets by mouth in the morning and at bedtime.   docusate sodium 100 MG capsule Commonly known as: COLACE Take 200 mg by mouth as needed for mild constipation.   feeding supplement Liqd Take 237 mLs by mouth daily.   lisinopril 10 MG tablet Commonly known as: ZESTRIL Take 1 tablet (10 mg total) by mouth daily.   magnesium 30 MG tablet Take 30 mg by mouth every other day.   PRESERVISION AREDS 2+MULTI VIT PO Take 1 capsule by mouth 2 (two) times daily.   Prevagen 10 MG Caps Generic drug: Apoaequorin Take 10 mg by mouth daily.   Prostate Caps Take 1 tablet by mouth 2 (two) times daily. Prostate  Plus otc supplement   PROSTATE PO Take 1 tablet by mouth daily.   senna 8.6 MG Tabs tablet Commonly known as: SENOKOT Take 1 tablet by mouth daily as needed for mild constipation.   timolol 0.5 % ophthalmic solution Commonly known as: TIMOPTIC INSTILL ONE DROP IN [facility] EYE DAILY What changed:  how much to take how to take this when to take this additional instructions   vitamin C 500 MG tablet Commonly known as: ASCORBIC ACID Take 500 mg by mouth daily.   Xarelto 20 MG Tabs tablet Generic drug: rivaroxaban TAKE ONE TABLET BY MOUTH DAILY WITH SUPPER What changed: See the new instructions.               Discharge Care Instructions  (From  admission, onward)           Start     Ordered   02/12/21 0000  Discharge wound care:       Comments: Silicone foam dressings to the right achilles wound and right dorsal foot blisters change every 3 days. Assess under dressings each shift for any acute changes in the wounds. Wound care to unstageable pressure injury to coccycx cleanse with NS and pat dry. Apply 1/8 inch layer of collagenase (Santyl) to the black non visible tissue, top with saline dampened gauze 2x2, cover with dry gauze 2x2s and secure with a silicone foam. Apply collagenase daily. Replace PRN soiling. Wound care to left lateral malleolus. Unstageable pressure injury: cleanse with NS, pat dry. Cover with folded piece of xeroform gauze, Lawson #294. Top with dry gauze 4x4s and secure with a few turns of Kerlix roll gauze/ paper tape. Place foot/ feet into Prevalon boots, wound care every shift.   02/12/21 1411            Allergies  Allergen Reactions   Doxycycline Other (See Comments)    Upset stomach   Keflex [Cephalexin] Other (See Comments)    Abdominal discomfort 04-19-17 Pt has tolerated orally      Procedures/Studies: CT PELVIS W CONTRAST  Result Date: 02/06/2021 CLINICAL DATA:  Possible decubitus wound EXAM: CT PELVIS WITH CONTRAST TECHNIQUE: Multidetector CT imaging of the pelvis was performed using the standard protocol following the bolus administration of intravenous contrast. CONTRAST:  52mL OMNIPAQUE IOHEXOL 350 MG/ML SOLN COMPARISON:  01/08/2021 FINDINGS: Urinary Tract: Bladder is well distended and somewhat obscured by scatter artifact from bilateral hip replacements. No gross abnormality is noted. Bowel: No obstructive changes are seen. Retained fecal material is noted throughout the visualized colon. Additionally some fecal material appears to lie within the intergluteal cleft. Vascular/Lymphatic: Diffuse vascular calcifications are noted. No aneurysmal dilatation is seen. No lymphadenopathy is  noted. Reproductive: Prostate is obscured by scatter artifact from the hip replacements. Other:  No definitive free fluid is seen. Musculoskeletal: Bilateral hip replacements are seen creating considerable scatter artifact. Some skin thickening is noted posteriorly as well as some subcutaneous edema which may represent some early decubitus changes without definitive skin breakdown. No discrete bony erosive changes are noted. Degenerative changes of lumbar spine are noted. IMPRESSION: No discrete decubitus wound is identified. No bony erosive changes are seen. Skin thickening and some subcutaneous edema is noted however. Fecal material within the gluteal cleft likely related to recent bowel movement No other acute abnormality is noted. Electronically Signed   By: Inez Catalina M.D.   On: 02/06/2021 19:09   DG Chest Port 1 View  Result Date: 02/06/2021 CLINICAL DATA:  Questionable sepsis EXAM: PORTABLE CHEST  1 VIEW COMPARISON:  CT chest dated January 08, 2021 FINDINGS: The heart is mildly enlarged. Atherosclerotic calcification of the aortic arch. Bilateral lower lobe fibrotic changes and pleural calcification are unchanged. Right midlung opacity which may represent atelectasis or infiltrate. IMPRESSION: 1.  Stable cardiomegaly. 2. Bibasilar fibrotic changes and pleural calcifications, likely secondary to prior asbestos exposure. 3. Left midlung opacity which may represent atelectasis or infiltrate. Electronically Signed   By: Keane Police D.O.   On: 02/06/2021 17:26       Subjective: Patient continue to be stable, dyspnea has been improving and mentation likely back to baseline, but he continue to be very weak and deconditioned, not back to baseline.   Discharge Exam: Vitals:   02/11/21 2055 02/12/21 0548  BP: (!) 142/68 (!) 173/68  Pulse: (!) 36 62  Resp: 20 20  Temp: 99 F (37.2 C) 98.3 F (36.8 C)  SpO2: 95% 94%   Vitals:   02/11/21 0457 02/11/21 1351 02/11/21 2055 02/12/21 0548  BP: (!)  150/70 (!) 153/73 (!) 142/68 (!) 173/68  Pulse: (!) 40 (!) 36 (!) 36 62  Resp: 20 18 20 20   Temp: 98 F (36.7 C) 98.5 F (36.9 C) 99 F (37.2 C) 98.3 F (36.8 C)  TempSrc: Axillary Oral Oral Oral  SpO2: 95% 96% 95% 94%  Weight:      Height:        General: Not in pain or dyspnea, deconditioned  Neurology: Awake. Eyes closed.  E ENT: no pallor, no icterus, oral mucosa moist Cardiovascular: No JVD. S1-S2 present, rhythmic, no gallops, rubs, or murmurs. No lower extremity edema. Pulmonary: positive breath sounds bilaterally, with no wheezing, rhonchi or rales. Gastrointestinal. Abdomen soft and non tender Skin. No rashes Musculoskeletal: no joint deformities   The results of significant diagnostics from this hospitalization (including imaging, microbiology, ancillary and laboratory) are listed below for reference.     Microbiology: Recent Results (from the past 240 hour(s))  Blood Culture (routine x 2)     Status: None   Collection Time: 02/06/21  5:01 PM   Specimen: BLOOD  Result Value Ref Range Status   Specimen Description   Final    BLOOD BLOOD RIGHT FOREARM Performed at [facility] [address] [place], [place] [address]    Special Requests   Final    BOTTLES DRAWN AEROBIC AND ANAEROBIC Blood Culture results may not be optimal due to an inadequate volume of blood received in culture bottles Performed at [facility] [address] [place], [place] [address]    Culture   Final    NO GROWTH 5 DAYS Performed at [facility], [place] [address], [place], [place] [address]    Report Status 02/11/2021 FINAL  Final  Blood Culture (routine x 2)     Status: None   Collection Time: 02/06/21  5:06 PM   Specimen: BLOOD  Result Value Ref Range Status   Specimen Description   Final    BLOOD BLOOD RIGHT FOREARM Performed at [facility] [address] [place], [place] [address]    Special Requests   Final     BOTTLES DRAWN AEROBIC AND ANAEROBIC Blood Culture adequate volume Performed at [facility] [address], [place], [place] [address]    Culture   Final    NO GROWTH 5 DAYS Performed at [facility], [place] [address] [place], [place] [address]    Report Status 02/11/2021  FINAL  Final  Urine Culture     Status: Abnormal   Collection Time: 02/06/21  8:07 PM   Specimen: In/Out Cath Urine  Result Value Ref Range Status   Specimen Description   Final    IN/OUT CATH URINE Performed at [facility] [address] [place], [place] [address]    Special Requests   Final    NONE Performed at [facility], [place] [address], [place], [place] [address]    Culture MULTIPLE SPECIES PRESENT, SUGGEST RECOLLECTION (A)  Final   Report Status 02/08/2021 FINAL  Final  Resp Panel by RT-PCR (Flu A&B, Covid) Nasopharyngeal Swab     Status: None   Collection Time: 02/06/21  8:52 PM   Specimen: Nasopharyngeal Swab; Nasopharyngeal(NP) swabs in vial transport medium  Result Value Ref Range Status   SARS Coronavirus 2 by RT PCR NEGATIVE NEGATIVE Final    Comment: (NOTE) SARS-CoV-2 target nucleic acids are NOT DETECTED.  The SARS-CoV-2 RNA is generally detectable in upper respiratory specimens during the acute phase of infection. The lowest concentration of SARS-CoV-2 viral copies this assay can detect is 138 copies/mL. A negative result does not preclude SARS-Cov-2 infection and should not be used as the sole basis for treatment or other patient management decisions. A negative result may occur with  improper specimen collection/handling, submission of specimen other than nasopharyngeal swab, presence of viral mutation(s) within the areas targeted by this assay, and inadequate number of viral copies(<138 copies/mL). A negative result must be combined with clinical observations, patient history, and epidemiological information. The expected  result is Negative.  Fact Sheet for Patients:  EntrepreneurPulse.com.au  Fact Sheet for Healthcare Providers:  IncredibleEmployment.be  This test is no t yet approved or cleared by the [place] FDA and  has been authorized for detection and/or diagnosis of SARS-CoV-2 by FDA under an Emergency Use Authorization (EUA). This EUA will remain  in effect (meaning this test can be used) for the duration of the COVID-19 declaration under Section 564(b)(1) of the Act, 21 U.S.C.section 360bbb-3(b)(1), unless the authorization is terminated  or revoked sooner.       Influenza A by PCR NEGATIVE NEGATIVE Final   Influenza B by PCR NEGATIVE NEGATIVE Final    Comment: (NOTE) The Xpert Xpress SARS-CoV-2/FLU/RSV plus assay is intended as an aid in the diagnosis of influenza from Nasopharyngeal swab specimens and should not be used as a sole basis for treatment. Nasal washings and aspirates are unacceptable for Xpert Xpress SARS-CoV-2/FLU/RSV testing.  Fact Sheet for Patients: EntrepreneurPulse.com.au  Fact Sheet for Healthcare Providers: IncredibleEmployment.be  This test is not yet approved or cleared by the [place] FDA and has been authorized for detection and/or diagnosis of SARS-CoV-2 by FDA under an Emergency Use Authorization (EUA). This EUA will remain in effect (meaning this test can be used) for the duration of the COVID-19 declaration under Section 564(b)(1) of the Act, 21 U.S.C. section 360bbb-3(b)(1), unless the authorization is terminated or revoked.  Performed at [facility], [place] [address] [place], [place] [address]      Labs: BNP (last 3 results) Recent Labs    02/06/21 1718  BNP 166.0*   Basic Metabolic Panel: Recent Labs  Lab 02/06/21 1718 02/07/21 0447 02/08/21 0402 02/09/21 0416 02/11/21 0515  NA 137 136 137 140 140  K 4.3 4.0 4.0 3.7 3.7  CL 105 105  108 109 108  CO2 26 25 24 25 26   GLUCOSE 132* 100*  103* 99 101*  BUN 38* 36* 29* 27* 24*  CREATININE 1.18 0.91 0.90 0.87 0.66  CALCIUM 8.9 8.5* 8.3* 8.4* 8.5*  MG 2.4 2.1  --   --   --   PHOS  --  2.8  --   --   --    Liver Function Tests: Recent Labs  Lab 02/06/21 1718 02/07/21 0447  AST 37 40  ALT 17 23  ALKPHOS 82 70  BILITOT 0.5 0.6  PROT 6.8 6.0*  ALBUMIN 3.0* 2.8*   No results for input(s): LIPASE, AMYLASE in the last 168 hours. Recent Labs  Lab 02/07/21 0447  AMMONIA 19   CBC: Recent Labs  Lab 02/06/21 1718 02/07/21 0447 02/08/21 0402 02/09/21 0416 02/11/21 0515  WBC 7.3 7.4 10.1 9.9 8.1  NEUTROABS 4.6 4.3  --   --   --   HGB 12.7* 11.9* 12.5* 11.5* 10.9*  HCT 39.9 37.3* 38.9* 35.5* 33.2*  MCV 96.8 95.6 94.6 93.9 93.3  PLT 225 212 237 226 282   Cardiac Enzymes: Recent Labs  Lab 02/06/21 1718  CKTOTAL 619*   BNP: Invalid input(s): POCBNP CBG: Recent Labs  Lab 02/09/21 2122 02/11/21 0726  GLUCAP 125* 86   D-Dimer No results for input(s): DDIMER in the last 72 hours. Hgb A1c No results for input(s): HGBA1C in the last 72 hours. Lipid Profile No results for input(s): CHOL, HDL, LDLCALC, TRIG, CHOLHDL, LDLDIRECT in the last 72 hours. Thyroid function studies No results for input(s): TSH, T4TOTAL, T3FREE, THYROIDAB in the last 72 hours.  Invalid input(s): FREET3 Anemia work up No results for input(s): VITAMINB12, FOLATE, FERRITIN, TIBC, IRON, RETICCTPCT in the last 72 hours. Urinalysis    Component Value Date/Time   COLORURINE YELLOW 02/06/2021 2007   APPEARANCEUR HAZY (A) 02/06/2021 2007   LABSPEC >1.046 (H) 02/06/2021 2007   PHURINE 5.0 02/06/2021 2007   GLUCOSEU NEGATIVE 02/06/2021 2007   HGBUR NEGATIVE 02/06/2021 2007   BILIRUBINUR NEGATIVE 02/06/2021 2007   BILIRUBINUR negative 01/24/2020 1749   BILIRUBINUR negative 04/01/2018 1148   KETONESUR 5 (A) 02/06/2021 2007   PROTEINUR 30 (A) 02/06/2021 2007   UROBILINOGEN 0.2 01/24/2020  1749   UROBILINOGEN 1.0 04/27/2012 0019   NITRITE NEGATIVE 02/06/2021 2007   LEUKOCYTESUR NEGATIVE 02/06/2021 2007   Sepsis Labs Invalid input(s): PROCALCITONIN,  WBC,  LACTICIDVEN Microbiology Recent Results (from the past 240 hour(s))  Blood Culture (routine x 2)     Status: None   Collection Time: 02/06/21  5:01 PM   Specimen: BLOOD  Result Value Ref Range Status   Specimen Description   Final    BLOOD BLOOD RIGHT FOREARM Performed at [facility], [place] [address] [place], [place] [address]    Special Requests   Final    BOTTLES DRAWN AEROBIC AND ANAEROBIC Blood Culture results may not be optimal due to an inadequate volume of blood received in culture bottles Performed at [facility] [address], [place], [place] [address]    Culture   Final    NO GROWTH 5 DAYS Performed at [facility], [place] [address], [place], [place] [address]    Report Status 02/11/2021 FINAL  Final  Blood Culture (routine x 2)     Status: None   Collection Time: 02/06/21  5:06 PM   Specimen: BLOOD  Result Value Ref Range Status   Specimen Description   Final    BLOOD BLOOD RIGHT FOREARM Performed at [facility], [address] [place], [place] [address]    Special Requests   Final    BOTTLES DRAWN AEROBIC AND ANAEROBIC Blood Culture adequate volume Performed at [facility] [address] [place], [place] [address]    Culture   Final    NO GROWTH 5 DAYS Performed at [facility], [place] [address], [place], [place] [address]    Report Status 02/11/2021 FINAL  Final  Urine Culture     Status: Abnormal   Collection Time: 02/06/21  8:07 PM   Specimen: In/Out Cath Urine  Result Value Ref Range Status   Specimen Description   Final    IN/OUT CATH URINE Performed at [facility] [address], [place], [place] [address]    Special Requests   Final    NONE Performed at [facility], [place] [address], [place],  [address]    Culture MULTIPLE SPECIES PRESENT, SUGGEST RECOLLECTION (A)  Final   Report Status 02/08/2021 FINAL  Final  Resp Panel by RT-PCR (Flu A&B, Covid) Nasopharyngeal Swab     Status: None   Collection Time: 02/06/21  8:52 PM   Specimen: Nasopharyngeal Swab; Nasopharyngeal(NP) swabs in vial transport medium  Result Value Ref Range Status   SARS Coronavirus 2 by RT PCR NEGATIVE NEGATIVE Final    Comment: (NOTE) SARS-CoV-2 target nucleic acids are NOT DETECTED.  The SARS-CoV-2 RNA is generally detectable in upper respiratory specimens during the acute phase of infection. The lowest concentration of SARS-CoV-2 viral copies this assay can detect is 138 copies/mL. A negative result does not preclude SARS-Cov-2 infection and should not be used as the sole basis for treatment or other patient management decisions. A negative result may occur with  improper specimen collection/handling, submission of specimen other than nasopharyngeal swab, presence of viral mutation(s) within the areas targeted by this assay, and inadequate number of viral copies(<138 copies/mL). A negative result must be combined with clinical observations, patient history, and epidemiological information. The expected result is Negative.  Fact Sheet for Patients:  EntrepreneurPulse.com.au  Fact Sheet for Healthcare Providers:  IncredibleEmployment.be  This test is no t yet approved or cleared by the [place] FDA and  has been authorized for detection and/or diagnosis of SARS-CoV-2 by FDA under an Emergency Use Authorization (EUA). This EUA will remain  in effect (meaning this test can be used) for the duration of the COVID-19 declaration under Section 564(b)(1) of the Act, 21 U.S.C.section 360bbb-3(b)(1), unless the authorization is terminated  or revoked sooner.       Influenza A by PCR NEGATIVE NEGATIVE Final    Influenza B by PCR NEGATIVE NEGATIVE Final    Comment: (NOTE) The Xpert Xpress SARS-CoV-2/FLU/RSV plus assay is intended as an aid in the diagnosis of influenza from Nasopharyngeal swab specimens and should not be used as a sole basis for treatment. Nasal washings and aspirates are unacceptable for Xpert Xpress SARS-CoV-2/FLU/RSV testing.  Fact Sheet for Patients: EntrepreneurPulse.com.au  Fact Sheet for Healthcare Providers: IncredibleEmployment.be  This test is not yet approved or cleared by the [place] FDA and has been authorized for detection and/or diagnosis of SARS-CoV-2 by FDA under an Emergency Use Authorization (EUA). This EUA will remain in effect (meaning this test can be used) for the duration of the COVID-19 declaration under Section 564(b)(1) of the Act, 21 U.S.C. section 360bbb-3(b)(1), unless the authorization is terminated or revoked.  Performed at [facility], [place] [address] [place], [place]  [address]      Time coordinating discharge: 45 minutes  SIGNED:   Tawni Millers, MD  [facility] 02/12/2021, 2:11 PM

## 2021-02-13 NOTE — Progress Notes (Addendum)
Patient continue to be stable for transfer to SNF.  He continue to have bradycardia, rate in the mid 30's, chronic bradycardia and followed by cardiology as outpatient.  I spoke with his wife yesterday about advance directives including resuscitation in case of cardiac arrest and she would like him to continue to be full code.   Patient with unchanged physical examination   Blood pressure 168/77, HR 31 to 62.  Aspiration pneumonia.  Continue medical therapy.  Plan to discharge to SNF today.

## 2021-02-13 NOTE — Progress Notes (Signed)
Physical Therapy Treatment Patient Details Name: Chad Russell MRN: [id number] DOB: February 01, 1927 Today's Date: 02/13/2021   History of Present Illness Pt is 87 yo male who presented to ED on 02/06/21 with generalized weakness and CAP.  Pt with hx including but not limited to  persistent atrial fibrillation chronically anticoagulated on Xarelto, chronic bradycardia, Parkinson's disease complicated by dementia, hypertension, Bil hip hemiarthroplasty.    PT Comments    Pt agreeable to mobilize and assisted with performing 2 sit to stands however pt fatigued quickly.  Continue to recommend SNF upon d/c.    Recommendations for follow up therapy are one component of a multi-disciplinary discharge planning process, led by the attending physician.  Recommendations may be updated based on patient status, additional functional criteria and insurance authorization.  Follow Up Recommendations  Skilled nursing-short term rehab (<3 hours/day)     Assistance Recommended at Discharge Frequent or constant Supervision/Assistance  Equipment Recommendations  Other (comment) (would need hospital bed, lift, w/c)    Recommendations for Other Services       Precautions / Restrictions Precautions Precautions: Fall Precaution Comments: Parkinsons     Mobility  Bed Mobility Overal bed mobility: Needs Assistance Bed Mobility: Supine to Sit;Sit to Supine     Supine to sit: Max assist;+2 for physical assistance Sit to supine: Total assist;+2 for physical assistance   General bed mobility comments: pt initiating however requiring increased assist to complete bed mobility; returned to bed and repositioned with prevalon boots in place    Transfers Overall transfer level: Needs assistance Equipment used: Rollator (4 wheels) Transfers: Sit to/from Stand Sit to Stand: Mod assist;Max assist;+2 physical assistance;From elevated surface           General transfer comment: pt's wife arrived to room  upon sitting EOB and requested we try using her rollator with pt when standing (he is used to rollator); pt requiring assist to rise and steady, pt with difficulty achieving full upright extension of knees/hips/trunk; performed sit to stand twice; pt able to hold standing  <30 seconds    Ambulation/Gait                   Stairs             Wheelchair Mobility    Modified Rankin (Stroke Patients Only)       Balance Overall balance assessment: Needs assistance Sitting-balance support: Feet supported;Bilateral upper extremity supported Sitting balance-Leahy Scale: Poor Sitting balance - Comments: reliant on UE support, trunk assist required with fatigue Postural control: Posterior lean Standing balance support: Bilateral upper extremity supported Standing balance-Leahy Scale: Zero Standing balance comment: requires external assist                            Cognition Arousal/Alertness: Awake/alert Behavior During Therapy: WFL for tasks assessed/performed                                   General Comments: sleepy today, when not engaged he closes his eyes however wife reports this is normal; does attempt to assist as able and follow commands        Exercises      General Comments        Pertinent Vitals/Pain Pain Assessment: Faces Faces Pain Scale: Hurts a little bit Pain Location: buttocks Pain Descriptors / Indicators: Discomfort;Grimacing Pain Intervention(s): Repositioned;Monitored during session  Home Living                          Prior Function            PT Goals (current goals can now be found in the care plan section) Progress towards PT goals: Progressing toward goals    Frequency    Min 2X/week      PT Plan Current plan remains appropriate    Co-evaluation              AM-PAC PT "6 Clicks" Mobility   Outcome Measure  Help needed turning from your back to your side while in a  flat bed without using bedrails?: Total Help needed moving from lying on your back to sitting on the side of a flat bed without using bedrails?: Total Help needed moving to and from a bed to a chair (including a wheelchair)?: Total Help needed standing up from a chair using your arms (e.g., wheelchair or bedside chair)?: Total Help needed to walk in hospital room?: Total Help needed climbing 3-5 steps with a railing? : Total 6 Click Score: 6    End of Session Equipment Utilized During Treatment: Gait belt Activity Tolerance: Patient limited by fatigue Patient left: in bed;with call bell/phone within reach;with family/visitor present Nurse Communication: Mobility status PT Visit Diagnosis: Other abnormalities of gait and mobility (R26.89);Muscle weakness (generalized) (M62.81)     Time: 6203-5597 PT Time Calculation (min) (ACUTE ONLY): 16 min  Charges:  $Therapeutic Activity: 8-22 mins                    Jannette Spanner PT, DPT Acute Rehabilitation Services Pager: [phone] Office: [place] 02/13/2021, 12:52 PM

## 2021-02-13 NOTE — Progress Notes (Signed)
Called to give report to receiving facility at 1730. [place], at receiving facility received report for pt. She didn't have any further questions for me at this time.

## 2021-02-13 NOTE — TOC Transition Note (Signed)
Transition of Care [facility]) - CM/SW Discharge Note   Patient Details  Name: DAILEY BUCCHERI MRN: [id number] Date of Birth: [birth year]  [facility] of [facility]) CM/SW Contact:  Leeroy Cha, RN Phone Number: 02/13/2021, 1:25 PM   Clinical Narrative:    Pt transferred to [facility] via [facility]tar.   Final next level of care: Skilled Nursing Facility Barriers to Discharge: Barriers Resolved   Patient Goals and CMS Choice Patient states their goals for this hospitalization and ongoing recovery are:: not stated CMS Medicare.gov Compare Post Acute Care list provided to:: Patient Represenative (must comment) (wife)    Discharge Placement                       Discharge Plan and Services   Discharge Planning Services: CM Consult Post Acute Care Choice: Skilled Nursing Facility                               Social Determinants of Health (SDOH) Interventions     Readmission Risk Interventions No flowsheet data found.

## 2021-02-14 DIAGNOSIS — J189 Pneumonia, unspecified organism: Secondary | ICD-10-CM | POA: Diagnosis not present

## 2021-02-14 DIAGNOSIS — M5030 Other cervical disc degeneration, unspecified cervical region: Secondary | ICD-10-CM | POA: Diagnosis not present

## 2021-02-14 DIAGNOSIS — H353221 Exudative age-related macular degeneration, left eye, with active choroidal neovascularization: Secondary | ICD-10-CM | POA: Diagnosis not present

## 2021-02-14 DIAGNOSIS — L89159 Pressure ulcer of sacral region, unspecified stage: Secondary | ICD-10-CM | POA: Diagnosis not present

## 2021-02-14 DIAGNOSIS — I119 Hypertensive heart disease without heart failure: Secondary | ICD-10-CM | POA: Diagnosis present

## 2021-02-14 DIAGNOSIS — R001 Bradycardia, unspecified: Secondary | ICD-10-CM | POA: Diagnosis not present

## 2021-02-14 DIAGNOSIS — F0390 Unspecified dementia without behavioral disturbance: Secondary | ICD-10-CM | POA: Diagnosis not present

## 2021-02-14 DIAGNOSIS — I959 Hypotension, unspecified: Secondary | ICD-10-CM | POA: Diagnosis not present

## 2021-02-14 DIAGNOSIS — R220 Localized swelling, mass and lump, head: Secondary | ICD-10-CM | POA: Diagnosis not present

## 2021-02-14 DIAGNOSIS — L89324 Pressure ulcer of left buttock, stage 4: Secondary | ICD-10-CM | POA: Diagnosis not present

## 2021-02-14 DIAGNOSIS — R4182 Altered mental status, unspecified: Secondary | ICD-10-CM | POA: Diagnosis not present

## 2021-02-14 DIAGNOSIS — R5383 Other fatigue: Secondary | ICD-10-CM | POA: Diagnosis not present

## 2021-02-14 DIAGNOSIS — L89302 Pressure ulcer of unspecified buttock, stage 2: Secondary | ICD-10-CM | POA: Diagnosis not present

## 2021-02-14 DIAGNOSIS — I4819 Other persistent atrial fibrillation: Secondary | ICD-10-CM | POA: Diagnosis not present

## 2021-02-14 DIAGNOSIS — R7982 Elevated C-reactive protein (CRP): Secondary | ICD-10-CM | POA: Diagnosis not present

## 2021-02-14 DIAGNOSIS — I4891 Unspecified atrial fibrillation: Secondary | ICD-10-CM | POA: Diagnosis not present

## 2021-02-14 DIAGNOSIS — Z7189 Other specified counseling: Secondary | ICD-10-CM | POA: Diagnosis not present

## 2021-02-14 DIAGNOSIS — F0280 Dementia in other diseases classified elsewhere without behavioral disturbance: Secondary | ICD-10-CM | POA: Diagnosis present

## 2021-02-14 DIAGNOSIS — L89520 Pressure ulcer of left ankle, unstageable: Secondary | ICD-10-CM | POA: Diagnosis not present

## 2021-02-14 DIAGNOSIS — L89323 Pressure ulcer of left buttock, stage 3: Secondary | ICD-10-CM | POA: Diagnosis not present

## 2021-02-14 DIAGNOSIS — K219 Gastro-esophageal reflux disease without esophagitis: Secondary | ICD-10-CM | POA: Diagnosis present

## 2021-02-14 DIAGNOSIS — R509 Fever, unspecified: Secondary | ICD-10-CM | POA: Diagnosis not present

## 2021-02-14 DIAGNOSIS — M1990 Unspecified osteoarthritis, unspecified site: Secondary | ICD-10-CM | POA: Diagnosis present

## 2021-02-14 DIAGNOSIS — R64 Cachexia: Secondary | ICD-10-CM | POA: Diagnosis present

## 2021-02-14 DIAGNOSIS — J9601 Acute respiratory failure with hypoxia: Secondary | ICD-10-CM | POA: Diagnosis not present

## 2021-02-14 DIAGNOSIS — R0902 Hypoxemia: Secondary | ICD-10-CM | POA: Diagnosis not present

## 2021-02-14 DIAGNOSIS — R918 Other nonspecific abnormal finding of lung field: Secondary | ICD-10-CM | POA: Diagnosis not present

## 2021-02-14 DIAGNOSIS — R531 Weakness: Secondary | ICD-10-CM | POA: Diagnosis not present

## 2021-02-14 DIAGNOSIS — I4811 Longstanding persistent atrial fibrillation: Secondary | ICD-10-CM | POA: Diagnosis not present

## 2021-02-14 DIAGNOSIS — Z043 Encounter for examination and observation following other accident: Secondary | ICD-10-CM | POA: Diagnosis not present

## 2021-02-14 DIAGNOSIS — G9341 Metabolic encephalopathy: Secondary | ICD-10-CM | POA: Diagnosis not present

## 2021-02-14 DIAGNOSIS — Z7401 Bed confinement status: Secondary | ICD-10-CM | POA: Diagnosis not present

## 2021-02-14 DIAGNOSIS — K6389 Other specified diseases of intestine: Secondary | ICD-10-CM | POA: Diagnosis not present

## 2021-02-14 DIAGNOSIS — Z8616 Personal history of COVID-19: Secondary | ICD-10-CM | POA: Diagnosis not present

## 2021-02-14 DIAGNOSIS — R0689 Other abnormalities of breathing: Secondary | ICD-10-CM | POA: Diagnosis not present

## 2021-02-14 DIAGNOSIS — R1310 Dysphagia, unspecified: Secondary | ICD-10-CM | POA: Diagnosis not present

## 2021-02-14 DIAGNOSIS — R52 Pain, unspecified: Secondary | ICD-10-CM | POA: Diagnosis not present

## 2021-02-14 DIAGNOSIS — Z23 Encounter for immunization: Secondary | ICD-10-CM | POA: Diagnosis not present

## 2021-02-14 DIAGNOSIS — N401 Enlarged prostate with lower urinary tract symptoms: Secondary | ICD-10-CM | POA: Diagnosis not present

## 2021-02-14 DIAGNOSIS — K5641 Fecal impaction: Secondary | ICD-10-CM | POA: Diagnosis not present

## 2021-02-14 DIAGNOSIS — M6281 Muscle weakness (generalized): Secondary | ICD-10-CM | POA: Diagnosis not present

## 2021-02-14 DIAGNOSIS — G20 Parkinson's disease: Secondary | ICD-10-CM | POA: Diagnosis not present

## 2021-02-14 DIAGNOSIS — R911 Solitary pulmonary nodule: Secondary | ICD-10-CM | POA: Diagnosis not present

## 2021-02-14 DIAGNOSIS — R404 Transient alteration of awareness: Secondary | ICD-10-CM | POA: Diagnosis not present

## 2021-02-14 DIAGNOSIS — J690 Pneumonitis due to inhalation of food and vomit: Secondary | ICD-10-CM | POA: Diagnosis not present

## 2021-02-14 DIAGNOSIS — R5381 Other malaise: Secondary | ICD-10-CM | POA: Diagnosis not present

## 2021-02-14 DIAGNOSIS — R54 Age-related physical debility: Secondary | ICD-10-CM | POA: Diagnosis present

## 2021-02-14 DIAGNOSIS — I700 Atherosclerosis of aorta: Secondary | ICD-10-CM | POA: Diagnosis not present

## 2021-02-14 DIAGNOSIS — R32 Unspecified urinary incontinence: Secondary | ICD-10-CM | POA: Diagnosis present

## 2021-02-14 DIAGNOSIS — Z515 Encounter for palliative care: Secondary | ICD-10-CM | POA: Diagnosis not present

## 2021-02-14 DIAGNOSIS — L89512 Pressure ulcer of right ankle, stage 2: Secondary | ICD-10-CM | POA: Diagnosis not present

## 2021-02-14 DIAGNOSIS — R262 Difficulty in walking, not elsewhere classified: Secondary | ICD-10-CM | POA: Diagnosis not present

## 2021-02-14 DIAGNOSIS — W19XXXA Unspecified fall, initial encounter: Secondary | ICD-10-CM | POA: Diagnosis not present

## 2021-02-14 DIAGNOSIS — J90 Pleural effusion, not elsewhere classified: Secondary | ICD-10-CM | POA: Diagnosis not present

## 2021-02-14 DIAGNOSIS — U071 COVID-19: Secondary | ICD-10-CM | POA: Diagnosis not present

## 2021-02-14 DIAGNOSIS — S0990XA Unspecified injury of head, initial encounter: Secondary | ICD-10-CM | POA: Diagnosis not present

## 2021-02-14 DIAGNOSIS — I2510 Atherosclerotic heart disease of native coronary artery without angina pectoris: Secondary | ICD-10-CM | POA: Diagnosis present

## 2021-02-14 DIAGNOSIS — E440 Moderate protein-calorie malnutrition: Secondary | ICD-10-CM | POA: Diagnosis not present

## 2021-02-14 DIAGNOSIS — Z7901 Long term (current) use of anticoagulants: Secondary | ICD-10-CM | POA: Diagnosis not present

## 2021-02-14 DIAGNOSIS — M2550 Pain in unspecified joint: Secondary | ICD-10-CM | POA: Diagnosis not present

## 2021-02-14 DIAGNOSIS — Z66 Do not resuscitate: Secondary | ICD-10-CM | POA: Diagnosis not present

## 2021-02-14 DIAGNOSIS — R1312 Dysphagia, oropharyngeal phase: Secondary | ICD-10-CM | POA: Diagnosis not present

## 2021-02-14 DIAGNOSIS — L89896 Pressure-induced deep tissue damage of other site: Secondary | ICD-10-CM | POA: Diagnosis not present

## 2021-02-14 DIAGNOSIS — G4733 Obstructive sleep apnea (adult) (pediatric): Secondary | ICD-10-CM | POA: Diagnosis present

## 2021-02-14 DIAGNOSIS — S0101XA Laceration without foreign body of scalp, initial encounter: Secondary | ICD-10-CM | POA: Diagnosis not present

## 2021-02-14 DIAGNOSIS — L89154 Pressure ulcer of sacral region, stage 4: Secondary | ICD-10-CM | POA: Diagnosis not present

## 2021-02-14 DIAGNOSIS — H543 Unqualified visual loss, both eyes: Secondary | ICD-10-CM | POA: Diagnosis present

## 2021-02-14 DIAGNOSIS — J1282 Pneumonia due to coronavirus disease 2019: Secondary | ICD-10-CM | POA: Diagnosis not present

## 2021-02-14 DIAGNOSIS — M545 Low back pain, unspecified: Secondary | ICD-10-CM | POA: Diagnosis not present

## 2021-02-14 DIAGNOSIS — I10 Essential (primary) hypertension: Secondary | ICD-10-CM | POA: Diagnosis not present

## 2021-02-14 DIAGNOSIS — N400 Enlarged prostate without lower urinary tract symptoms: Secondary | ICD-10-CM | POA: Diagnosis present

## 2021-02-14 DIAGNOSIS — I480 Paroxysmal atrial fibrillation: Secondary | ICD-10-CM | POA: Diagnosis not present

## 2021-02-14 DIAGNOSIS — K5900 Constipation, unspecified: Secondary | ICD-10-CM | POA: Diagnosis not present

## 2021-02-14 DIAGNOSIS — I517 Cardiomegaly: Secondary | ICD-10-CM | POA: Diagnosis not present

## 2021-02-14 DIAGNOSIS — K6289 Other specified diseases of anus and rectum: Secondary | ICD-10-CM | POA: Diagnosis not present

## 2021-02-14 DIAGNOSIS — L89320 Pressure ulcer of left buttock, unstageable: Secondary | ICD-10-CM | POA: Diagnosis not present

## 2021-02-14 DIAGNOSIS — E43 Unspecified severe protein-calorie malnutrition: Secondary | ICD-10-CM | POA: Diagnosis not present

## 2021-02-18 DIAGNOSIS — L89320 Pressure ulcer of left buttock, unstageable: Secondary | ICD-10-CM | POA: Diagnosis not present

## 2021-02-18 DIAGNOSIS — L89896 Pressure-induced deep tissue damage of other site: Secondary | ICD-10-CM | POA: Diagnosis not present

## 2021-02-19 DIAGNOSIS — M545 Low back pain, unspecified: Secondary | ICD-10-CM | POA: Diagnosis not present

## 2021-02-19 DIAGNOSIS — M6281 Muscle weakness (generalized): Secondary | ICD-10-CM | POA: Diagnosis not present

## 2021-02-19 DIAGNOSIS — R5381 Other malaise: Secondary | ICD-10-CM | POA: Diagnosis not present

## 2021-02-19 DIAGNOSIS — L89324 Pressure ulcer of left buttock, stage 4: Secondary | ICD-10-CM | POA: Diagnosis not present

## 2021-02-19 DIAGNOSIS — R52 Pain, unspecified: Secondary | ICD-10-CM | POA: Diagnosis not present

## 2021-02-25 ENCOUNTER — Emergency Department (HOSPITAL_COMMUNITY): Payer: Medicare HMO

## 2021-02-25 ENCOUNTER — Emergency Department (HOSPITAL_COMMUNITY)
Admission: EM | Admit: 2021-02-25 | Discharge: 2021-02-25 | Disposition: A | Discharge status: Skilled Nursing Facility | Payer: Medicare HMO | Attending: Emergency Medicine | Admitting: Emergency Medicine

## 2021-02-25 DIAGNOSIS — Z23 Encounter for immunization: Secondary | ICD-10-CM | POA: Diagnosis not present

## 2021-02-25 DIAGNOSIS — R220 Localized swelling, mass and lump, head: Secondary | ICD-10-CM | POA: Diagnosis not present

## 2021-02-25 DIAGNOSIS — S0101XA Laceration without foreign body of scalp, initial encounter: Secondary | ICD-10-CM | POA: Diagnosis not present

## 2021-02-25 DIAGNOSIS — W19XXXA Unspecified fall, initial encounter: Secondary | ICD-10-CM | POA: Diagnosis not present

## 2021-02-25 DIAGNOSIS — F0390 Unspecified dementia without behavioral disturbance: Secondary | ICD-10-CM | POA: Insufficient documentation

## 2021-02-25 DIAGNOSIS — Z7901 Long term (current) use of anticoagulants: Secondary | ICD-10-CM | POA: Insufficient documentation

## 2021-02-25 DIAGNOSIS — S0990XA Unspecified injury of head, initial encounter: Secondary | ICD-10-CM | POA: Diagnosis not present

## 2021-02-25 DIAGNOSIS — R0902 Hypoxemia: Secondary | ICD-10-CM | POA: Diagnosis not present

## 2021-02-25 DIAGNOSIS — R001 Bradycardia, unspecified: Secondary | ICD-10-CM | POA: Insufficient documentation

## 2021-02-25 DIAGNOSIS — Z043 Encounter for examination and observation following other accident: Secondary | ICD-10-CM | POA: Diagnosis not present

## 2021-02-25 DIAGNOSIS — I10 Essential (primary) hypertension: Secondary | ICD-10-CM | POA: Diagnosis not present

## 2021-02-25 DIAGNOSIS — L89320 Pressure ulcer of left buttock, unstageable: Secondary | ICD-10-CM | POA: Diagnosis not present

## 2021-02-25 DIAGNOSIS — Z7401 Bed confinement status: Secondary | ICD-10-CM | POA: Diagnosis not present

## 2021-02-25 DIAGNOSIS — M5030 Other cervical disc degeneration, unspecified cervical region: Secondary | ICD-10-CM | POA: Diagnosis not present

## 2021-02-25 LAB — BASIC METABOLIC PANEL
Anion gap: 9 (ref 5–15)
BUN: 26 mg/dL — ABNORMAL HIGH (ref 8–23)
CO2: 22 mmol/L (ref 22–32)
Calcium: 8.7 mg/dL — ABNORMAL LOW (ref 8.9–10.3)
Chloride: 104 mmol/L (ref 98–111)
Creatinine, Ser: 0.88 mg/dL (ref 0.61–1.24)
GFR, Estimated: >60 mL/min (ref >60)
Glucose, Bld: 105 mg/dL — ABNORMAL HIGH (ref 70–99)
Potassium: 4.2 mmol/L (ref 3.5–5.1)
Sodium: 135 mmol/L (ref 135–145)

## 2021-02-25 LAB — CBC WITH DIFFERENTIAL/PLATELET
Abs Immature Granulocytes: 0.04 10*3/uL (ref 0.00–0.07)
Basophils Absolute: 0 10*3/uL (ref 0.0–0.1)
Basophils Relative: 0 %
Eosinophils Absolute: 0 10*3/uL (ref 0.0–0.5)
Eosinophils Relative: 0 %
HCT: 38 % — ABNORMAL LOW (ref 39.0–52.0)
Hemoglobin: 12.2 g/dL — ABNORMAL LOW (ref 13.0–17.0)
Immature Granulocytes: 0 %
Lymphocytes Relative: 17 %
Lymphs Abs: 1.7 10*3/uL (ref 0.7–4.0)
MCH: 30.1 pg (ref 26.0–34.0)
MCHC: 32.1 g/dL (ref 30.0–36.0)
MCV: 93.8 fL (ref 80.0–100.0)
Monocytes Absolute: 1.2 10*3/uL — ABNORMAL HIGH (ref 0.1–1.0)
Monocytes Relative: 12 %
Neutro Abs: 6.8 10*3/uL (ref 1.7–7.7)
Neutrophils Relative %: 71 %
Platelets: 249 10*3/uL (ref 150–400)
RBC: 4.05 MIL/uL — ABNORMAL LOW (ref 4.22–5.81)
RDW: 15.8 % — ABNORMAL HIGH (ref 11.5–15.5)
WBC: 9.8 10*3/uL (ref 4.0–10.5)
nRBC: 0 % (ref 0.0–0.2)

## 2021-02-25 LAB — TYPE AND SCREEN
ABO/RH(D): O POS
Antibody Screen: NEGATIVE

## 2021-02-25 LAB — PROTIME-INR
INR: 1.5 — ABNORMAL HIGH (ref 0.8–1.2)
Prothrombin Time: 17.8 seconds — ABNORMAL HIGH (ref 11.4–15.2)

## 2021-02-25 LAB — CK: Total CK: 83 U/L (ref 49–397)

## 2021-02-25 MED ORDER — TETANUS-DIPHTH-ACELL PERTUSSIS 5-2.5-18.5 LF-MCG/0.5 IM SUSY
0.5000 mL | PREFILLED_SYRINGE | Freq: Once | INTRAMUSCULAR | Status: AC
  Administered 2021-02-25: 0.5 mL via INTRAMUSCULAR

## 2021-02-25 NOTE — ED Notes (Signed)
Pt transported to CT with [facility]

## 2021-02-25 NOTE — ED Notes (Signed)
TRN and Primary RN noted pt had a pressure ulcer to the sacrum area, and pt complaining of pain "in the small of my back." Provider was notified and TRN helped to turn pt so provider could see. Pt also noted to have a bowel movement. Pt taken into a room with 2 nurse techs and patients old dressing removed and new dressing placed over pressure ulcer. Dressing included a saline moist gauze with a mepilex pad, as that appears to be what pt had on. Pt cleaned up from his bowel movement and he also noted to have urinated, and pt was cleaned, new depends placed and primary RN notified.

## 2021-02-25 NOTE — Progress Notes (Signed)
Orthopedic Tech Progress Note Patient Details:  Chad Russell 05/30/1926 [id number]  Patient ID: Chad Russell, male   DOB: Nov 02, 1926, 88 y.o.   MRN: [id number] Level 2 not needed. Zaniyah Wernette L Estell Puccini 02/25/2021, 3:02 AM

## 2021-02-25 NOTE — ED Notes (Signed)
Called [facility] to transport patient to [facility].

## 2021-02-25 NOTE — ED Provider Notes (Signed)
[facility] EMERGENCY DEPARTMENT Provider Note   CSN: [id number] Arrival date & time: 02/25/21  2992     History  Chief Complaint  Patient presents with   Fall   [place] is a 88 y.o. male who presents to the ED today from SNF for unwitnessed fall.  Per EMS patient believes he was on the ground for approximately 30 minutes however he does have a history of dementia.  Facility told EMS that patient was acting at baseline.  He is noted to have a hematoma/laceration to the top of his head and is currently anticoagulated on Xarelto for A. fib.  Patient denies any other pain besides pain to his head.  He is alert and oriented to person, place, and situation.  Patient believes it is 47.   EMS reports that patient was slightly bradycardic in the 50s to 60s on arrival to the ED today however they report that he has a history of same.   The history is provided by the patient, the EMS personnel and medical records.      Home Medications Prior to Admission medications   Medication Sig Start Date End Date Taking? Authorizing Provider  acetaminophen (TYLENOL) 325 MG tablet Take 650 mg by mouth every 6 (six) hours as needed for mild pain.   Yes [provider]  calcium carbonate (TUMS - DOSED IN MG ELEMENTAL CALCIUM) 500 MG chewable tablet Chew 1 tablet (200 mg of elemental calcium total) by mouth 2 (two) times daily with a meal. Patient taking differently: Chew 1 tablet by mouth as needed. 10/20/20  Yes Sheikh, Omair Latif, DO  carbidopa-levodopa (SINEMET) 25-100 MG tablet Take 1.5 tablets by mouth in the morning and at bedtime. 06/11/19  Yes Patel, Donika K, DO  docusate sodium (COLACE) 100 MG capsule Take 200 mg by mouth as needed for mild constipation.   Yes [provider]  HYDROcodone-acetaminophen (NORCO/VICODIN) 5-325 MG tablet Take 1 tablet by mouth every 4 (four) hours as needed for moderate pain.   Yes [provider]  lisinopril (ZESTRIL) 10 MG tablet Take 1 tablet (10 mg total) by mouth daily. 10/24/20 02/25/21 Yes Lendon Colonel, NP  magnesium oxide (MAG-OX) 400 MG tablet Take 400 mg by mouth daily. **one tablet by mouth every other day**   Yes [provider]  Multiple Vitamins-Minerals (PRESERVISION AREDS 2+MULTI VIT PO) Take 1 capsule by mouth 2 (two) times daily.   Yes [provider]  senna (SENOKOT) 8.6 MG TABS tablet Take 1 tablet by mouth daily as needed for mild constipation.   Yes [provider]  timolol (TIMOPTIC) 0.5 % ophthalmic solution INSTILL ONE DROP IN [facility] EYE DAILY Patient taking differently: Place 1 drop into both eyes 2 (two) times daily. 09/08/20  Yes Rankin, Clent Demark, MD  vitamin C (ASCORBIC ACID) 500 MG tablet Take 500 mg by mouth daily.   Yes [provider]  XARELTO 20 MG TABS tablet TAKE ONE TABLET BY MOUTH DAILY WITH SUPPER Patient taking differently: Take 20 mg by mouth daily with supper. 01/06/21  Yes Minus Breeding, MD  acetaminophen (TYLENOL) 500 MG tablet Take 1 tablet (500 mg total) by mouth every 8 (eight) hours. Patient not taking: Reported on 02/25/2021 12/26/18   Debbe Odea, MD  feeding supplement (ENSURE ENLIVE / ENSURE PLUS) LIQD Take 237 mLs by mouth daily. Patient not taking: Reported on 02/25/2021 10/20/20   Raiford Noble Latif, DO  magnesium  30 MG tablet Take 30 mg by mouth every other day.    [provider]  Misc Natural Products (PROSTATE) CAPS Take 1 tablet by mouth 2 (two) times daily. Prostate Plus otc supplement Patient not taking: Reported on 02/25/2021    [provider]  Nutritional Supplements (PROSTATE PO) Take 1 tablet by mouth daily. Patient not taking: Reported on 02/25/2021    [provider]      Allergies    Doxycycline and Keflex [cephalexin]    Review of Systems   Review of Systems  Unable to perform ROS: Dementia  Constitutional:  Negative for fever.   Musculoskeletal:  Negative for arthralgias.  Skin:  Positive for wound.  Neurological:  Positive for headaches.   Physical Exam Updated Vital Signs BP 118/63    Pulse (!) 46    Temp 98.1 F (36.7 C) (Oral)    Resp 13    Ht 6' (1.829 m)    Wt 77 kg    SpO2 100%    BMI 23.02 kg/m  Physical Exam Vitals and nursing note reviewed.  Constitutional:      Appearance: He is not ill-appearing or diaphoretic.  HENT:     Head: Normocephalic.     Comments: Hematoma/abrasion noted to mid frontal scalp with mild active bleeding No raccoon's sign or battle's sign Eyes:     Extraocular Movements: Extraocular movements intact.     Conjunctiva/sclera: Conjunctivae normal.     Pupils: Pupils are equal, round, and reactive to light.  Neck:     Comments: C collar in place Cardiovascular:     Rate and Rhythm: Normal rate and regular rhythm.  Pulmonary:     Effort: Pulmonary effort is normal.     Breath sounds: Normal breath sounds. No wheezing, rhonchi or rales.     Comments: Ribs without TTP bilaterally.  Chest:     Chest wall: No tenderness.  Abdominal:     Palpations: Abdomen is soft.     Tenderness: There is no abdominal tenderness. There is no guarding or rebound.  Musculoskeletal:     Cervical back: Neck supple.     Comments: Pelvis stable. No TTP to bilateral hips. Moving all extremities without difficulty. Unna boots on bilaterally.   Skin:    General: Skin is warm and dry.  Neurological:     Mental Status: He is alert.     Comments: Oriented to person, place, and situation. Baseline per EMS.     ED Results / Procedures / Treatments   Labs (all labs ordered are listed, but only abnormal results are displayed) Labs Reviewed  BASIC METABOLIC PANEL - Abnormal; Notable for the following components:      Result Value   Glucose, Bld 105 (*)    BUN 26 (*)    Calcium 8.7 (*)    All other components within normal limits  CBC WITH DIFFERENTIAL/PLATELET - Abnormal; Notable for the  following components:   RBC 4.05 (*)    Hemoglobin 12.2 (*)    HCT 38.0 (*)    RDW 15.8 (*)    Monocytes Absolute 1.2 (*)    All other components within normal limits  PROTIME-INR - Abnormal; Notable for the following components:   Prothrombin Time 17.8 (*)    INR 1.5 (*)    All other components within normal limits  CK  TYPE AND SCREEN    EKG None  Radiology CT Head Wo Contrast  Result Date: 02/25/2021 CLINICAL DATA:  Fall, head  injury, chronic anticoagulation EXAM: CT HEAD WITHOUT CONTRAST CT CERVICAL SPINE WITHOUT CONTRAST TECHNIQUE: Multidetector CT imaging of the head and cervical spine was performed following the standard protocol without intravenous contrast. Multiplanar CT image reconstructions of the cervical spine were also generated. COMPARISON:  None. FINDINGS: CT HEAD FINDINGS Brain: Normal anatomic configuration. Parenchymal volume loss is commensurate with the patient's age. Moderate periventricular and pontine white matter changes are present likely reflecting the sequela of small vessel ischemia. No abnormal intra or extra-axial mass lesion or fluid collection. No abnormal mass effect or midline shift. No evidence of acute intracranial hemorrhage or infarct. Ventricular size is normal. Cerebellum unremarkable. Vascular: No asymmetric hyperdense vasculature at the skull base. Skull: Intact Sinuses/Orbits: Paranasal sinuses are clear. Ocular lenses have been removed. Orbits are otherwise unremarkable. Other: Mastoid air cells and middle ear cavities are clear. There is mild frontal soft tissue swelling. CT CERVICAL SPINE FINDINGS Alignment: Motion degraded examination. 2 mm retrolisthesis of C4-5 and C5-6 is likely degenerative in nature. Skull base and vertebrae: Craniocervical alignment is normal. Atlantodental interval is not widened. There is fusion of the facets of C2-3 bilaterally and C3-4 on the right. No acute fracture of the cervical spine. Vertebral body height has been  preserved. Soft tissues and spinal canal: No prevertebral fluid or swelling. No visible canal hematoma. Moderate atherosclerotic calcification within the carotid bifurcations. Disc levels: Diffuse intervertebral disc space narrowing, disc calcification, and endplate remodeling in keeping with changes of diffuse severe degenerative disc disease. Prevertebral soft tissues are not thickened. Spinal canal is widely patent. Review of the axial images demonstrates multilevel uncovertebral and facet arthrosis resulting in multilevel mild-to-moderate neuroforaminal narrowing. Upper chest: Unremarkable Other: None IMPRESSION: No acute intracranial injury. No calvarial fracture. Mild frontal soft tissue swelling. No acute fracture or listhesis of the cervical spine. Moderate atherosclerotic calcification within the carotid bifurcations bilaterally. Electronically Signed   By: Fidela Salisbury M.D.   On: 02/25/2021 03:35   CT Cervical Spine Wo Contrast  Result Date: 02/25/2021 CLINICAL DATA:  Fall, head injury, chronic anticoagulation EXAM: CT HEAD WITHOUT CONTRAST CT CERVICAL SPINE WITHOUT CONTRAST TECHNIQUE: Multidetector CT imaging of the head and cervical spine was performed following the standard protocol without intravenous contrast. Multiplanar CT image reconstructions of the cervical spine were also generated. COMPARISON:  None. FINDINGS: CT HEAD FINDINGS Brain: Normal anatomic configuration. Parenchymal volume loss is commensurate with the patient's age. Moderate periventricular and pontine white matter changes are present likely reflecting the sequela of small vessel ischemia. No abnormal intra or extra-axial mass lesion or fluid collection. No abnormal mass effect or midline shift. No evidence of acute intracranial hemorrhage or infarct. Ventricular size is normal. Cerebellum unremarkable. Vascular: No asymmetric hyperdense vasculature at the skull base. Skull: Intact Sinuses/Orbits: Paranasal sinuses are clear.  Ocular lenses have been removed. Orbits are otherwise unremarkable. Other: Mastoid air cells and middle ear cavities are clear. There is mild frontal soft tissue swelling. CT CERVICAL SPINE FINDINGS Alignment: Motion degraded examination. 2 mm retrolisthesis of C4-5 and C5-6 is likely degenerative in nature. Skull base and vertebrae: Craniocervical alignment is normal. Atlantodental interval is not widened. There is fusion of the facets of C2-3 bilaterally and C3-4 on the right. No acute fracture of the cervical spine. Vertebral body height has been preserved. Soft tissues and spinal canal: No prevertebral fluid or swelling. No visible canal hematoma. Moderate atherosclerotic calcification within the carotid bifurcations. Disc levels: Diffuse intervertebral disc space narrowing, disc calcification, and endplate remodeling in keeping with  changes of diffuse severe degenerative disc disease. Prevertebral soft tissues are not thickened. Spinal canal is widely patent. Review of the axial images demonstrates multilevel uncovertebral and facet arthrosis resulting in multilevel mild-to-moderate neuroforaminal narrowing. Upper chest: Unremarkable Other: None IMPRESSION: No acute intracranial injury. No calvarial fracture. Mild frontal soft tissue swelling. No acute fracture or listhesis of the cervical spine. Moderate atherosclerotic calcification within the carotid bifurcations bilaterally. Electronically Signed   By: Fidela Salisbury M.D.   On: 02/25/2021 03:35    Procedures .Marland KitchenLaceration Repair  Date/Time: 02/25/2021 3:11 AM Performed by: Eustaquio Maize, PA-C Authorized by: Eustaquio Maize, PA-C   Consent:    Consent obtained:  Verbal   Consent given by:  Patient   Risks discussed:  Infection, pain and poor cosmetic result Universal protocol:    Patient identity confirmed:  Verbally with patient Anesthesia:    Anesthesia method:  None Laceration details:    Location:  Scalp   Scalp location:  Frontal    Length (cm):  2.5   Depth (mm):  2 Skin repair:    Repair method:  Staples   Number of staples:  3 Approximation:    Approximation:  Close Repair type:    Repair type:  Intermediate Post-procedure details:    Dressing:  Open (no dressing)    Medications Ordered in ED Medications  Tdap (BOOSTRIX) injection 0.5 mL (0.5 mLs Intramuscular Given 02/25/21 0323)    ED Course/ Medical Decision Making/ A&P                           Medical Decision Making 88 year old male who presents to the ED today for unwitnessed fall from skilled nursing facility.  Arrives as a level 2 trauma as he is anticoagulated on Xarelto for A. fib.  On arrival to the ED patient is alert and oriented to person, place, situation.  This is baseline per EMS.  He is noted to have a hematoma and laceration to the frontal aspect of his scalp with mild active bleeding.  Collar in place.  Patient appears to be in no acute distress at this time.  He complains of a mild headache.  Otherwise he is symptom-free.  Remainder of skeletal exam is reassuring at this time.  We will plan for CT head and CT C-spine.  We will plan for labs given unwitnessed fall as well as EKG.  Patient will require staples.  Per chart review unable to see if patient is up-to-date on tetanus, will update in the ED today.  Labwork unremarkable at this time. CT Head and CT C spine clear without acute abnormality. Laceration repaired with staples. Pt to be discharged back to his facility with instructions for staple removal in 1 week. Discussed case with attending physician Dr. Leonette Monarch who is in agreement with plan.   This note was prepared using Dragon voice recognition software and may include unintentional dictation errors due to the inherent limitations of voice recognition software.    Amount and/or Complexity of Data Reviewed Labs: ordered.    Details: CBC without leukocytosis. Hgb 12.2; improved from baseline.  BMP with glucose 105. No other  electrolyte abnormalities.  CK 83 INR 1.5 Radiology: ordered and independent interpretation performed.    Details: No obvious bleed or skull fracture appreciated on my read of CT scan. Confirmed by radiologist. CT neck without acute fx. ECG/medicine tests: ordered and independent interpretation performed.    Details: EKG with significant motion artifact.  Patient  with history of dementia/Parkinson's and tremor.          Final Clinical Impression(s) / ED Diagnoses Final diagnoses:  Fall, initial encounter  Injury of head, initial encounter  Laceration of scalp, initial encounter    Rx / DC Orders ED Discharge Orders     None        Discharge Instructions      You will need to have staples removed in 1 weeks time. Follow up with PCP for same.   Return to the ED for any new/worsening symptoms       Eustaquio Maize, PA-C 02/25/21 0403    Fatima Blank, MD 02/27/21 [phone]

## 2021-02-25 NOTE — Discharge Instructions (Signed)
You will need to have staples removed in 1 weeks time. Follow up with PCP for same.   Return to the ED for any new/worsening symptoms

## 2021-02-25 NOTE — ED Notes (Signed)
Pt arrived via [facility] for cc of level 2 fall on thinners Chad Russell) from [employer]. Pt found face down on flood of room GCS 2 (Baseline per staff). Pt bed bound at facility. Hematoma/laceration to top of head, bleeding controlled.  20 LAC  HR 50-60 RR 18 BP 184/80 CBG 142

## 2021-02-25 NOTE — ED Notes (Signed)
Trauma Response Nurse Note-  Reason for Call / Reason for Trauma activation:   - Level 2 fall on blood thinners, with hematoma and laceration to top of head  Initial Focused Assessment (If applicable, or please see trauma documentation):  - Pt came in alert to self. Hematoma to the top of the head noted, with laceration. Minimal bleeding.   Interventions:  - Pt came in with IV. Blood work obtained. CT head and c-spine completed. Provider stapled laceration to head. Pyxis are down and unable to pull tdap at this time  Plan of Care as of this note:  - Tdap to be given. Imaging results to be obtained  Event Summary:   -Pt came in as a level 2 trauma, fall on blood thinners. EMS reports per facility, it was an unwitnessed fall/roll out of bed. Pt has history of parkinson's and dementia. Pt alert to self. Pt noted to have a hematoma and laceration to the top of the head, which provider stapled in the ED. Pt also have blood work obtained and ct head and c-spine completed. Pt back from CT at 0256.

## 2021-02-26 DIAGNOSIS — G20 Parkinson's disease: Secondary | ICD-10-CM | POA: Diagnosis not present

## 2021-02-26 DIAGNOSIS — R262 Difficulty in walking, not elsewhere classified: Secondary | ICD-10-CM | POA: Diagnosis not present

## 2021-02-26 DIAGNOSIS — R531 Weakness: Secondary | ICD-10-CM | POA: Diagnosis not present

## 2021-02-26 DIAGNOSIS — L89302 Pressure ulcer of unspecified buttock, stage 2: Secondary | ICD-10-CM | POA: Diagnosis not present

## 2021-03-02 DIAGNOSIS — R7982 Elevated C-reactive protein (CRP): Secondary | ICD-10-CM | POA: Diagnosis not present

## 2021-03-02 DIAGNOSIS — M6281 Muscle weakness (generalized): Secondary | ICD-10-CM | POA: Diagnosis not present

## 2021-03-02 DIAGNOSIS — G20 Parkinson's disease: Secondary | ICD-10-CM | POA: Diagnosis not present

## 2021-03-02 DIAGNOSIS — F0390 Unspecified dementia without behavioral disturbance: Secondary | ICD-10-CM | POA: Diagnosis not present

## 2021-03-02 DIAGNOSIS — U071 COVID-19: Secondary | ICD-10-CM | POA: Diagnosis not present

## 2021-03-02 DIAGNOSIS — R5383 Other fatigue: Secondary | ICD-10-CM | POA: Diagnosis not present

## 2021-03-04 DIAGNOSIS — L89324 Pressure ulcer of left buttock, stage 4: Secondary | ICD-10-CM | POA: Diagnosis not present

## 2021-03-04 DIAGNOSIS — L89320 Pressure ulcer of left buttock, unstageable: Secondary | ICD-10-CM | POA: Diagnosis not present

## 2021-03-04 DIAGNOSIS — L89520 Pressure ulcer of left ankle, unstageable: Secondary | ICD-10-CM | POA: Diagnosis not present

## 2021-03-04 DIAGNOSIS — L89512 Pressure ulcer of right ankle, stage 2: Secondary | ICD-10-CM | POA: Diagnosis not present

## 2021-03-09 DIAGNOSIS — L89520 Pressure ulcer of left ankle, unstageable: Secondary | ICD-10-CM | POA: Diagnosis not present

## 2021-03-09 DIAGNOSIS — L89324 Pressure ulcer of left buttock, stage 4: Secondary | ICD-10-CM | POA: Diagnosis not present

## 2021-03-09 DIAGNOSIS — L89320 Pressure ulcer of left buttock, unstageable: Secondary | ICD-10-CM | POA: Diagnosis not present

## 2021-03-11 DIAGNOSIS — R5383 Other fatigue: Secondary | ICD-10-CM | POA: Diagnosis not present

## 2021-03-11 DIAGNOSIS — M6281 Muscle weakness (generalized): Secondary | ICD-10-CM | POA: Diagnosis not present

## 2021-03-11 DIAGNOSIS — R52 Pain, unspecified: Secondary | ICD-10-CM | POA: Diagnosis not present

## 2021-03-11 DIAGNOSIS — Z8616 Personal history of COVID-19: Secondary | ICD-10-CM | POA: Diagnosis not present

## 2021-03-16 DIAGNOSIS — L89320 Pressure ulcer of left buttock, unstageable: Secondary | ICD-10-CM | POA: Diagnosis not present

## 2021-03-16 DIAGNOSIS — L89520 Pressure ulcer of left ankle, unstageable: Secondary | ICD-10-CM | POA: Diagnosis not present

## 2021-03-16 DIAGNOSIS — L89324 Pressure ulcer of left buttock, stage 4: Secondary | ICD-10-CM | POA: Diagnosis not present

## 2021-03-23 DIAGNOSIS — L89323 Pressure ulcer of left buttock, stage 3: Secondary | ICD-10-CM | POA: Diagnosis not present

## 2021-03-23 DIAGNOSIS — L89324 Pressure ulcer of left buttock, stage 4: Secondary | ICD-10-CM | POA: Diagnosis not present

## 2021-03-25 ENCOUNTER — Ambulatory Visit: Payer: Medicare HMO | Admitting: Neurology

## 2021-03-25 ENCOUNTER — Encounter (HOSPITAL_COMMUNITY): Payer: Self-pay

## 2021-03-25 ENCOUNTER — Other Ambulatory Visit: Payer: Self-pay

## 2021-03-25 ENCOUNTER — Inpatient Hospital Stay (HOSPITAL_COMMUNITY)
Admission: EM | Admit: 2021-03-25 | Discharge: 2021-05-23 | DRG: 177 | Disposition: E | Payer: Medicare HMO | Source: Skilled Nursing Facility | Attending: Family Medicine | Admitting: Family Medicine

## 2021-03-25 ENCOUNTER — Inpatient Hospital Stay (HOSPITAL_COMMUNITY): Payer: Medicare HMO

## 2021-03-25 ENCOUNTER — Emergency Department (HOSPITAL_COMMUNITY): Payer: Medicare HMO

## 2021-03-25 DIAGNOSIS — R0602 Shortness of breath: Secondary | ICD-10-CM

## 2021-03-25 DIAGNOSIS — N400 Enlarged prostate without lower urinary tract symptoms: Secondary | ICD-10-CM | POA: Diagnosis present

## 2021-03-25 DIAGNOSIS — I4811 Longstanding persistent atrial fibrillation: Secondary | ICD-10-CM

## 2021-03-25 DIAGNOSIS — Z22322 Carrier or suspected carrier of Methicillin resistant Staphylococcus aureus: Secondary | ICD-10-CM

## 2021-03-25 DIAGNOSIS — I480 Paroxysmal atrial fibrillation: Secondary | ICD-10-CM | POA: Diagnosis not present

## 2021-03-25 DIAGNOSIS — K5641 Fecal impaction: Secondary | ICD-10-CM | POA: Diagnosis present

## 2021-03-25 DIAGNOSIS — I442 Atrioventricular block, complete: Secondary | ICD-10-CM | POA: Diagnosis present

## 2021-03-25 DIAGNOSIS — I119 Hypertensive heart disease without heart failure: Secondary | ICD-10-CM | POA: Diagnosis present

## 2021-03-25 DIAGNOSIS — I517 Cardiomegaly: Secondary | ICD-10-CM | POA: Diagnosis not present

## 2021-03-25 DIAGNOSIS — K219 Gastro-esophageal reflux disease without esophagitis: Secondary | ICD-10-CM | POA: Diagnosis present

## 2021-03-25 DIAGNOSIS — R1310 Dysphagia, unspecified: Secondary | ICD-10-CM

## 2021-03-25 DIAGNOSIS — Z881 Allergy status to other antibiotic agents status: Secondary | ICD-10-CM

## 2021-03-25 DIAGNOSIS — Z7901 Long term (current) use of anticoagulants: Secondary | ICD-10-CM

## 2021-03-25 DIAGNOSIS — Z66 Do not resuscitate: Secondary | ICD-10-CM | POA: Diagnosis not present

## 2021-03-25 DIAGNOSIS — G4733 Obstructive sleep apnea (adult) (pediatric): Secondary | ICD-10-CM | POA: Diagnosis present

## 2021-03-25 DIAGNOSIS — L89159 Pressure ulcer of sacral region, unspecified stage: Secondary | ICD-10-CM | POA: Diagnosis present

## 2021-03-25 DIAGNOSIS — Z6823 Body mass index (BMI) 23.0-23.9, adult: Secondary | ICD-10-CM

## 2021-03-25 DIAGNOSIS — J9601 Acute respiratory failure with hypoxia: Secondary | ICD-10-CM | POA: Diagnosis not present

## 2021-03-25 DIAGNOSIS — E43 Unspecified severe protein-calorie malnutrition: Secondary | ICD-10-CM | POA: Diagnosis present

## 2021-03-25 DIAGNOSIS — I4891 Unspecified atrial fibrillation: Secondary | ICD-10-CM | POA: Diagnosis present

## 2021-03-25 DIAGNOSIS — R911 Solitary pulmonary nodule: Secondary | ICD-10-CM | POA: Diagnosis not present

## 2021-03-25 DIAGNOSIS — K5900 Constipation, unspecified: Secondary | ICD-10-CM | POA: Diagnosis present

## 2021-03-25 DIAGNOSIS — Z515 Encounter for palliative care: Secondary | ICD-10-CM

## 2021-03-25 DIAGNOSIS — Z87891 Personal history of nicotine dependence: Secondary | ICD-10-CM

## 2021-03-25 DIAGNOSIS — L89154 Pressure ulcer of sacral region, stage 4: Secondary | ICD-10-CM | POA: Diagnosis not present

## 2021-03-25 DIAGNOSIS — Z7189 Other specified counseling: Secondary | ICD-10-CM | POA: Diagnosis not present

## 2021-03-25 DIAGNOSIS — Z9079 Acquired absence of other genital organ(s): Secondary | ICD-10-CM

## 2021-03-25 DIAGNOSIS — R64 Cachexia: Secondary | ICD-10-CM | POA: Diagnosis present

## 2021-03-25 DIAGNOSIS — I959 Hypotension, unspecified: Secondary | ICD-10-CM | POA: Diagnosis not present

## 2021-03-25 DIAGNOSIS — R0902 Hypoxemia: Secondary | ICD-10-CM | POA: Diagnosis not present

## 2021-03-25 DIAGNOSIS — Z8701 Personal history of pneumonia (recurrent): Secondary | ICD-10-CM | POA: Diagnosis not present

## 2021-03-25 DIAGNOSIS — J189 Pneumonia, unspecified organism: Secondary | ICD-10-CM | POA: Diagnosis not present

## 2021-03-25 DIAGNOSIS — I252 Old myocardial infarction: Secondary | ICD-10-CM

## 2021-03-25 DIAGNOSIS — R509 Fever, unspecified: Secondary | ICD-10-CM | POA: Diagnosis not present

## 2021-03-25 DIAGNOSIS — R54 Age-related physical debility: Secondary | ICD-10-CM | POA: Diagnosis present

## 2021-03-25 DIAGNOSIS — J90 Pleural effusion, not elsewhere classified: Secondary | ICD-10-CM | POA: Diagnosis not present

## 2021-03-25 DIAGNOSIS — R627 Adult failure to thrive: Secondary | ICD-10-CM | POA: Diagnosis present

## 2021-03-25 DIAGNOSIS — F0280 Dementia in other diseases classified elsewhere without behavioral disturbance: Secondary | ICD-10-CM | POA: Diagnosis present

## 2021-03-25 DIAGNOSIS — G20 Parkinson's disease: Secondary | ICD-10-CM | POA: Diagnosis not present

## 2021-03-25 DIAGNOSIS — T17908A Unspecified foreign body in respiratory tract, part unspecified causing other injury, initial encounter: Secondary | ICD-10-CM

## 2021-03-25 DIAGNOSIS — R001 Bradycardia, unspecified: Secondary | ICD-10-CM | POA: Diagnosis not present

## 2021-03-25 DIAGNOSIS — G20A1 Parkinson's disease without dyskinesia, without mention of fluctuations: Secondary | ICD-10-CM | POA: Diagnosis present

## 2021-03-25 DIAGNOSIS — G9341 Metabolic encephalopathy: Secondary | ICD-10-CM | POA: Diagnosis present

## 2021-03-25 DIAGNOSIS — U071 COVID-19: Principal | ICD-10-CM | POA: Diagnosis present

## 2021-03-25 DIAGNOSIS — J690 Pneumonitis due to inhalation of food and vomit: Secondary | ICD-10-CM | POA: Diagnosis not present

## 2021-03-25 DIAGNOSIS — M1990 Unspecified osteoarthritis, unspecified site: Secondary | ICD-10-CM | POA: Diagnosis present

## 2021-03-25 DIAGNOSIS — Z96643 Presence of artificial hip joint, bilateral: Secondary | ICD-10-CM | POA: Diagnosis present

## 2021-03-25 DIAGNOSIS — I700 Atherosclerosis of aorta: Secondary | ICD-10-CM | POA: Diagnosis not present

## 2021-03-25 DIAGNOSIS — I10 Essential (primary) hypertension: Secondary | ICD-10-CM | POA: Diagnosis not present

## 2021-03-25 DIAGNOSIS — H543 Unqualified visual loss, both eyes: Secondary | ICD-10-CM | POA: Diagnosis present

## 2021-03-25 DIAGNOSIS — Z7401 Bed confinement status: Secondary | ICD-10-CM

## 2021-03-25 DIAGNOSIS — K6289 Other specified diseases of anus and rectum: Secondary | ICD-10-CM | POA: Diagnosis not present

## 2021-03-25 DIAGNOSIS — J1282 Pneumonia due to coronavirus disease 2019: Secondary | ICD-10-CM | POA: Diagnosis not present

## 2021-03-25 DIAGNOSIS — R4182 Altered mental status, unspecified: Secondary | ICD-10-CM | POA: Diagnosis not present

## 2021-03-25 DIAGNOSIS — R404 Transient alteration of awareness: Secondary | ICD-10-CM | POA: Diagnosis not present

## 2021-03-25 DIAGNOSIS — R918 Other nonspecific abnormal finding of lung field: Secondary | ICD-10-CM | POA: Diagnosis present

## 2021-03-25 DIAGNOSIS — R0689 Other abnormalities of breathing: Secondary | ICD-10-CM | POA: Diagnosis not present

## 2021-03-25 DIAGNOSIS — Z79899 Other long term (current) drug therapy: Secondary | ICD-10-CM

## 2021-03-25 DIAGNOSIS — I2510 Atherosclerotic heart disease of native coronary artery without angina pectoris: Secondary | ICD-10-CM | POA: Diagnosis present

## 2021-03-25 DIAGNOSIS — Z8249 Family history of ischemic heart disease and other diseases of the circulatory system: Secondary | ICD-10-CM

## 2021-03-25 DIAGNOSIS — Z4682 Encounter for fitting and adjustment of non-vascular catheter: Secondary | ICD-10-CM | POA: Diagnosis not present

## 2021-03-25 DIAGNOSIS — D472 Monoclonal gammopathy: Secondary | ICD-10-CM | POA: Diagnosis present

## 2021-03-25 DIAGNOSIS — R32 Unspecified urinary incontinence: Secondary | ICD-10-CM | POA: Diagnosis present

## 2021-03-25 DIAGNOSIS — Z452 Encounter for adjustment and management of vascular access device: Secondary | ICD-10-CM | POA: Diagnosis not present

## 2021-03-25 DIAGNOSIS — Z4659 Encounter for fitting and adjustment of other gastrointestinal appliance and device: Secondary | ICD-10-CM

## 2021-03-25 DIAGNOSIS — K6389 Other specified diseases of intestine: Secondary | ICD-10-CM | POA: Diagnosis not present

## 2021-03-25 DIAGNOSIS — N401 Enlarged prostate with lower urinary tract symptoms: Secondary | ICD-10-CM | POA: Diagnosis present

## 2021-03-25 DIAGNOSIS — N138 Other obstructive and reflux uropathy: Secondary | ICD-10-CM | POA: Diagnosis present

## 2021-03-25 LAB — PROTIME-INR
INR: 1.4 — ABNORMAL HIGH (ref 0.8–1.2)
Prothrombin Time: 17.1 seconds — ABNORMAL HIGH (ref 11.4–15.2)

## 2021-03-25 LAB — URINALYSIS, ROUTINE W REFLEX MICROSCOPIC
Bacteria, UA: NONE SEEN
Bilirubin Urine: NEGATIVE
Glucose, UA: NEGATIVE mg/dL
Hgb urine dipstick: NEGATIVE
Ketones, ur: NEGATIVE mg/dL
Leukocytes,Ua: NEGATIVE
Nitrite: NEGATIVE
Protein, ur: 30 mg/dL — AB
Specific Gravity, Urine: 1.015 (ref 1.005–1.030)
pH: 6.5 (ref 5.0–8.0)

## 2021-03-25 LAB — I-STAT CHEM 8, ED
BUN: 26 mg/dL — ABNORMAL HIGH (ref 8–23)
Calcium, Ion: 1.15 mmol/L (ref 1.15–1.40)
Chloride: 101 mmol/L (ref 98–111)
Creatinine, Ser: 0.8 mg/dL (ref 0.61–1.24)
Glucose, Bld: 109 mg/dL — ABNORMAL HIGH (ref 70–99)
HCT: 27 % — ABNORMAL LOW (ref 39.0–52.0)
Hemoglobin: 9.2 g/dL — ABNORMAL LOW (ref 13.0–17.0)
Potassium: 4 mmol/L (ref 3.5–5.1)
Sodium: 136 mmol/L (ref 135–145)
TCO2: 25 mmol/L (ref 22–32)

## 2021-03-25 LAB — COMPREHENSIVE METABOLIC PANEL
ALT: 17 U/L (ref 0–44)
AST: 35 U/L (ref 15–41)
Albumin: 2.5 g/dL — ABNORMAL LOW (ref 3.5–5.0)
Alkaline Phosphatase: 85 U/L (ref 38–126)
Anion gap: 7 (ref 5–15)
BUN: 29 mg/dL — ABNORMAL HIGH (ref 8–23)
CO2: 25 mmol/L (ref 22–32)
Calcium: 8.2 mg/dL — ABNORMAL LOW (ref 8.9–10.3)
Chloride: 101 mmol/L (ref 98–111)
Creatinine, Ser: 0.76 mg/dL (ref 0.61–1.24)
GFR, Estimated: >60 mL/min (ref >60)
Glucose, Bld: 119 mg/dL — ABNORMAL HIGH (ref 70–99)
Potassium: 4 mmol/L (ref 3.5–5.1)
Sodium: 133 mmol/L — ABNORMAL LOW (ref 135–145)
Total Bilirubin: 0.9 mg/dL (ref 0.3–1.2)
Total Protein: 6.3 g/dL — ABNORMAL LOW (ref 6.5–8.1)

## 2021-03-25 LAB — CBC WITH DIFFERENTIAL/PLATELET
Abs Immature Granulocytes: 0.06 10*3/uL (ref 0.00–0.07)
Basophils Absolute: 0 10*3/uL (ref 0.0–0.1)
Basophils Relative: 0 %
Eosinophils Absolute: 0.1 10*3/uL (ref 0.0–0.5)
Eosinophils Relative: 1 %
HCT: 30.4 % — ABNORMAL LOW (ref 39.0–52.0)
Hemoglobin: 9.8 g/dL — ABNORMAL LOW (ref 13.0–17.0)
Immature Granulocytes: 1 %
Lymphocytes Relative: 16 %
Lymphs Abs: 1.6 10*3/uL (ref 0.7–4.0)
MCH: 29.8 pg (ref 26.0–34.0)
MCHC: 32.2 g/dL (ref 30.0–36.0)
MCV: 92.4 fL (ref 80.0–100.0)
Monocytes Absolute: 1.3 10*3/uL — ABNORMAL HIGH (ref 0.1–1.0)
Monocytes Relative: 14 %
Neutro Abs: 6.6 10*3/uL (ref 1.7–7.7)
Neutrophils Relative %: 68 %
Platelets: 313 10*3/uL (ref 150–400)
RBC: 3.29 MIL/uL — ABNORMAL LOW (ref 4.22–5.81)
RDW: 17.6 % — ABNORMAL HIGH (ref 11.5–15.5)
WBC: 9.7 10*3/uL (ref 4.0–10.5)
nRBC: 0 % (ref 0.0–0.2)

## 2021-03-25 LAB — APTT: aPTT: 34 seconds (ref 24–36)

## 2021-03-25 LAB — LACTIC ACID, PLASMA: Lactic Acid, Venous: 1.4 mmol/L (ref 0.5–1.9)

## 2021-03-25 LAB — RESP PANEL BY RT-PCR (FLU A&B, COVID) ARPGX2
Influenza A by PCR: NEGATIVE
Influenza B by PCR: NEGATIVE
SARS Coronavirus 2 by RT PCR: POSITIVE — AB

## 2021-03-25 LAB — D-DIMER, QUANTITATIVE: D-Dimer, Quant: 1.45 ug{FEU}/mL — ABNORMAL HIGH (ref 0.00–0.50)

## 2021-03-25 MED ORDER — ONDANSETRON HCL 4 MG PO TABS: 4.0000 mg | ORAL_TABLET | Freq: Four times a day (QID) | ORAL | Status: DC | PRN

## 2021-03-25 MED ORDER — LACTATED RINGERS IV SOLN: INTRAVENOUS | Status: AC

## 2021-03-25 MED ORDER — SODIUM CHLORIDE 0.9 % IV SOLN
3.0000 g | Freq: Four times a day (QID) | INTRAVENOUS | Status: DC
  Administered 2021-03-25 – 2021-03-27 (×7): 3 g via INTRAVENOUS
  Filled 2021-03-25 (×8): qty 8

## 2021-03-25 MED ORDER — BISACODYL 10 MG RE SUPP: 10.0000 mg | Freq: Every day | RECTAL | Status: DC | PRN

## 2021-03-25 MED ORDER — SODIUM CHLORIDE 0.9 % IV BOLUS (SEPSIS)
1000.0000 mL | Freq: Once | INTRAVENOUS | Status: AC
  Administered 2021-03-25: 1000 mL via INTRAVENOUS

## 2021-03-25 MED ORDER — ONDANSETRON HCL 4 MG/2ML IJ SOLN
4.0000 mg | Freq: Four times a day (QID) | INTRAMUSCULAR | Status: DC | PRN
  Administered 2021-04-02: 4 mg via INTRAVENOUS
  Filled 2021-03-25: qty 2

## 2021-03-25 MED ORDER — APOAEQUORIN 10 MG PO CAPS: 1.0000 | ORAL_CAPSULE | Freq: Every day | ORAL | Status: DC

## 2021-03-25 MED ORDER — SODIUM CHLORIDE 0.9 % IV SOLN: 2.0000 g | Freq: Once | INTRAVENOUS | Status: DC

## 2021-03-25 MED ORDER — IOHEXOL 300 MG/ML  SOLN
75.0000 mL | Freq: Once | INTRAMUSCULAR | Status: AC | PRN
  Administered 2021-03-25: 75 mL via INTRAVENOUS

## 2021-03-25 MED ORDER — SODIUM CHLORIDE 0.9 % IV SOLN
2.0000 g | Freq: Once | INTRAVENOUS | Status: AC
  Administered 2021-03-25: 2 g via INTRAVENOUS
  Filled 2021-03-25: qty 2

## 2021-03-25 MED ORDER — SODIUM CHLORIDE 0.9 % IV SOLN: 200.0000 mg | Freq: Once | INTRAVENOUS | Status: DC

## 2021-03-25 MED ORDER — HYDROCODONE-ACETAMINOPHEN 5-325 MG PO TABS
1.0000 | ORAL_TABLET | ORAL | Status: DC | PRN
  Administered 2021-03-31 – 2021-04-05 (×5): 1 via ORAL
  Filled 2021-03-25 (×5): qty 1

## 2021-03-25 MED ORDER — SODIUM CHLORIDE 0.9 % IV SOLN
200.0000 mg | Freq: Once | INTRAVENOUS | Status: AC
  Administered 2021-03-25: 200 mg via INTRAVENOUS
  Filled 2021-03-25: qty 40

## 2021-03-25 MED ORDER — ACETAMINOPHEN 325 MG PO TABS
650.0000 mg | ORAL_TABLET | Freq: Four times a day (QID) | ORAL | Status: DC | PRN
  Administered 2021-04-04: 650 mg via ORAL
  Filled 2021-03-25: qty 2

## 2021-03-25 MED ORDER — VANCOMYCIN HCL 1500 MG/300ML IV SOLN
1500.0000 mg | Freq: Once | INTRAVENOUS | Status: AC
  Administered 2021-03-25: 1500 mg via INTRAVENOUS
  Filled 2021-03-25: qty 300

## 2021-03-25 MED ORDER — VANCOMYCIN HCL IN DEXTROSE 1-5 GM/200ML-% IV SOLN: 1000.0000 mg | Freq: Once | INTRAVENOUS | Status: DC

## 2021-03-25 MED ORDER — REMDESIVIR 100 MG IV SOLR
100.0000 mg | Freq: Every day | INTRAVENOUS | Status: AC
  Administered 2021-03-26 – 2021-03-29 (×4): 100 mg via INTRAVENOUS
  Filled 2021-03-25 (×4): qty 20

## 2021-03-25 MED ORDER — SODIUM CHLORIDE 0.9 % IV SOLN: 100.0000 mg | Freq: Every day | INTRAVENOUS | Status: DC

## 2021-03-25 MED ORDER — FLEET ENEMA 7-19 GM/118ML RE ENEM
1.0000 | ENEMA | Freq: Once | RECTAL | Status: DC
  Filled 2021-03-25: qty 1

## 2021-03-25 MED ORDER — ENOXAPARIN SODIUM 80 MG/0.8ML IJ SOSY
75.0000 mg | PREFILLED_SYRINGE | Freq: Two times a day (BID) | INTRAMUSCULAR | Status: DC
  Administered 2021-03-25 – 2021-04-07 (×26): 75 mg via SUBCUTANEOUS
  Filled 2021-03-25 (×7): qty 0.8
  Filled 2021-03-25: qty 0.75
  Filled 2021-03-25 (×2): qty 0.8
  Filled 2021-03-25: qty 0.75
  Filled 2021-03-25 (×10): qty 0.8
  Filled 2021-03-25: qty 0.75
  Filled 2021-03-25 (×5): qty 0.8

## 2021-03-25 MED ORDER — CARBIDOPA-LEVODOPA 25-100 MG PO TABS
1.5000 | ORAL_TABLET | Freq: Two times a day (BID) | ORAL | Status: DC
  Administered 2021-03-26: 1.5 via ORAL
  Filled 2021-03-25 (×2): qty 1.5

## 2021-03-25 MED ORDER — RIVAROXABAN 20 MG PO TABS: 20.0000 mg | ORAL_TABLET | Freq: Every day | ORAL | Status: DC

## 2021-03-25 MED ORDER — TIMOLOL MALEATE 0.5 % OP SOLN
1.0000 [drp] | Freq: Every day | OPHTHALMIC | Status: DC
  Administered 2021-03-28 – 2021-04-26 (×31): 1 [drp] via OPHTHALMIC
  Filled 2021-03-25: qty 5

## 2021-03-25 MED ORDER — METHYLPREDNISOLONE SODIUM SUCC 40 MG IJ SOLR
40.0000 mg | INTRAMUSCULAR | Status: DC
  Administered 2021-03-25: 40 mg via INTRAVENOUS
  Filled 2021-03-25: qty 1

## 2021-03-25 NOTE — Assessment & Plan Note (Addendum)
No cellulitis or osteomyelitis on CT Follow wound care consult recommendations

## 2021-03-25 NOTE — Assessment & Plan Note (Addendum)
Care transitioned to comfort care. Xarelto discontinued.

## 2021-03-25 NOTE — ED Provider Notes (Signed)
[facility] DEPT Provider Note   CSN: [id number] Arrival date & time: 04/21/2021  1729     History  Chief Complaint  Patient presents with   Altered Mental Status    Chad Russell is a 88 y.o. male hx of dementia, hypertension, CAD here presenting with fever and worsening sacral decub ulcer.  Patient is from a nursing home.  Patient was noted to be more confused than usual.  Patient was noted to be febrile and also his sacral decub ulcer is foul-smelling.  Patient received Tylenol prior to arrival.  Also received 500 cc of normal saline prior to arrival.  Patient unable to answer any questions due to dementia  The history is provided by the EMS personnel.      Home Medications Prior to Admission medications   Medication Sig Start Date End Date Taking? Authorizing Provider  acetaminophen (TYLENOL) 325 MG tablet Take 650 mg by mouth every 6 (six) hours as needed for mild pain.   Yes [provider]  Apoaequorin (PREVAGEN) 10 MG CAPS Take 1 capsule by mouth daily.   Yes [provider]  calcium carbonate (TUMS - DOSED IN MG ELEMENTAL CALCIUM) 500 MG chewable tablet Chew 1 tablet (200 mg of elemental calcium total) by mouth 2 (two) times daily with a meal. Patient taking differently: Chew 1 tablet by mouth 2 (two) times daily. 10/20/20  Yes Sheikh, Omair Latif, DO  carbidopa-levodopa (SINEMET) 25-100 MG tablet Take 1.5 tablets by mouth in the morning and at bedtime. 06/11/19  Yes Patel, Donika K, DO  HYDROcodone-acetaminophen (NORCO/VICODIN) 5-325 MG tablet Take 1 tablet by mouth every 4 (four) hours as needed for moderate pain.   Yes [provider]  lisinopril (ZESTRIL) 10 MG tablet Take 1 tablet (10 mg total) by mouth daily. 10/24/20 04/14/2021 Yes Lendon Colonel, NP  magnesium oxide (MAG-OX) 400 MG tablet Take 400 mg by mouth daily. **one tablet by mouth every other day**   Yes [provider]  Multiple Vitamins-Minerals  (PRESERVISION AREDS 2+MULTI VIT PO) Take 1 capsule by mouth 2 (two) times daily.   Yes [provider]  Nutritional Supplements (PROSTATE PO) Take 1 tablet by mouth in the morning, at noon, and at bedtime.   Yes [provider]  timolol (TIMOPTIC) 0.5 % ophthalmic solution INSTILL ONE DROP IN [facility] EYE DAILY Patient taking differently: Place 1 drop into both eyes at bedtime. 09/08/20  Yes Rankin, Clent Demark, MD  vitamin C (ASCORBIC ACID) 500 MG tablet Take 500 mg by mouth daily.   Yes [provider]  XARELTO 20 MG TABS tablet TAKE ONE TABLET BY MOUTH DAILY WITH SUPPER Patient taking differently: Take 20 mg by mouth daily with supper. 01/06/21  Yes Minus Breeding, MD  feeding supplement (ENSURE ENLIVE / ENSURE PLUS) LIQD Take 237 mLs by mouth daily. Patient not taking: Reported on 02/25/2021 10/20/20   Chad Noble Latif, DO      Allergies    Doxycycline and Keflex [cephalexin]    Review of Systems   Review of Systems  Constitutional:  Positive for fatigue and fever.  Skin:  Positive for wound.  All other systems reviewed and are negative.  Physical Exam Updated Vital Signs BP 120/68    Pulse (!) 50    Temp 98.1 F (36.7 C) (Oral)    Resp 17    Wt 74.8 kg    SpO2 92%    BMI 22.38 kg/m  Physical Exam Vitals and nursing  note reviewed.  Constitutional:      Comments: Chronically ill   HENT:     Head: Normocephalic.     Nose: Nose normal.     Mouth/Throat:     Mouth: Mucous membranes are dry.  Eyes:     Extraocular Movements: Extraocular movements intact.     Pupils: Pupils are equal, round, and reactive to light.  Cardiovascular:     Rate and Rhythm: Normal rate and regular rhythm.     Pulses: Normal pulses.     Heart sounds: Normal heart sounds.  Pulmonary:     Comments: Diminished bilateral bases Abdominal:     General: Abdomen is flat.     Palpations: Abdomen is soft.  Musculoskeletal:     Cervical back: Normal range of motion and neck supple.     ED Results / Procedures / Treatments   Labs (all labs ordered are listed, but only abnormal results are displayed) Labs Reviewed  RESP PANEL BY RT-PCR (FLU A&B, COVID) ARPGX2 - Abnormal; Notable for the following components:      Result Value   SARS Coronavirus 2 by RT PCR POSITIVE (*)    All other components within normal limits  COMPREHENSIVE METABOLIC PANEL - Abnormal; Notable for the following components:   Sodium 133 (*)    Glucose, Bld 119 (*)    BUN 29 (*)    Calcium 8.2 (*)    Total Protein 6.3 (*)    Albumin 2.5 (*)    All other components within normal limits  CBC WITH DIFFERENTIAL/PLATELET - Abnormal; Notable for the following components:   RBC 3.29 (*)    Hemoglobin 9.8 (*)    HCT 30.4 (*)    RDW 17.6 (*)    Monocytes Absolute 1.3 (*)    All other components within normal limits  PROTIME-INR - Abnormal; Notable for the following components:   Prothrombin Time 17.1 (*)    INR 1.4 (*)    All other components within normal limits  URINALYSIS, ROUTINE W REFLEX MICROSCOPIC - Abnormal; Notable for the following components:   Color, Urine YELLOW (*)    APPearance CLEAR (*)    Protein, ur 30 (*)    All other components within normal limits  I-STAT CHEM 8, ED - Abnormal; Notable for the following components:   BUN 26 (*)    Glucose, Bld 109 (*)    Hemoglobin 9.2 (*)    HCT 27.0 (*)    All other components within normal limits  CULTURE, BLOOD (ROUTINE X 2)  CULTURE, BLOOD (ROUTINE X 2)  URINE CULTURE  MRSA NEXT GEN BY PCR, NASAL  LACTIC ACID, PLASMA  APTT  PROCALCITONIN  D-DIMER, QUANTITATIVE  C-REACTIVE PROTEIN  CBC WITH DIFFERENTIAL/PLATELET  COMPREHENSIVE METABOLIC PANEL  C-REACTIVE PROTEIN  D-DIMER, QUANTITATIVE    EKG EKG Interpretation  Date/Time:  Wednesday March 25 2021 17:42:39 EST Ventricular Rate:  53 PR Interval:    QRS Duration: 154 QT Interval:  351 QTC Calculation: 345 R Axis:   47 Text Interpretation: Atrial fibrillation Right  bundle branch block No significant change since last tracing Confirmed by Wandra Arthurs [phone]) on 04/18/2021 6:08:17 PM  Radiology CT HEAD WO CONTRAST (5MM)  Result Date: 04/08/2021 CLINICAL DATA:  Altered mental status. EXAM: CT HEAD WITHOUT CONTRAST TECHNIQUE: Contiguous axial images were obtained from the base of the skull through the vertex without intravenous contrast. RADIATION DOSE REDUCTION: This exam was performed according to the departmental dose-optimization program which includes automated exposure  control, adjustment of the mA and/or kV according to patient size and/or use of iterative reconstruction technique. COMPARISON:  02/25/2021 FINDINGS: Brain: Stable moderately enlarged ventricles and subarachnoid spaces. Stable moderate patchy white matter low density in both cerebral hemispheres. No intracranial hemorrhage, mass lesion or CT evidence of acute infarction. Vascular: No hyperdense vessel or unexpected calcification. Skull: Normal. Negative for fracture or focal lesion. Sinuses/Orbits: Status post bilateral cataract extraction. Unremarkable bones and included paranasal sinuses. Other: None. IMPRESSION: 1. No acute abnormality. 2. Stable atrophy and chronic small vessel white matter ischemic changes. Electronically Signed   By: Claudie Revering M.D.   On: 04/08/2021 19:01   CT CHEST W CONTRAST  Result Date: 04/10/2021 CLINICAL DATA:  Pneumonia, complication suspected, xray done Multifocal PNA: COVID+, trying to differentiate how much of this is COVID and how much is aspiration, and if theres anything else going on. EXAM: CT CHEST WITH CONTRAST TECHNIQUE: Multidetector CT imaging of the chest was performed during intravenous contrast administration. RADIATION DOSE REDUCTION: This exam was performed according to the departmental dose-optimization program which includes automated exposure control, adjustment of the mA and/or kV according to patient size and/or use of iterative reconstruction  technique. CONTRAST:  47mL OMNIPAQUE IOHEXOL 300 MG/ML  SOLN COMPARISON:  Chest x-ray 02/06/2021, PET CT 01/18/2017 FINDINGS: Cardiovascular: Prominent sized heart. No significant pericardial effusion. The thoracic aorta is normal in caliber. No atherosclerotic plaque of the thoracic aorta. No coronary artery calcifications. The pulmonary artery is normal in caliber. No central or proximal segmental pulmonary embolus. Mediastinum/Nodes: No enlarged mediastinal, hilar, or axillary lymph nodes. Thyroid gland, trachea, and esophagus demonstrate no significant findings. Lungs/Pleura: Interval development of nodular-like densities within the right upper and lower lobes with as an example a subsolid lesion measuring 1.8 cm in the right lower lobe (7:63). Multiple new subcentimeter pulmonary nodules are noted within the right upper lobe with as an example a 0.9 cm right upper lobe ground-glass nodule (7:78) and a 7 x 5 mm solid right upper lobe pulmonary nodule (7:77). Query cluster of nodules within the right lower lobe (7: 93-102). Slightly more conspicuous left lower lobe 8 x 8 mm subpleural nodule (7:84). Slightly more conspicuous 5 mm left upper lobe nodule (7:694). Redemonstration of bilateral pleural calcified plaques. Bilateral trace pleural effusions, right greater than left. Interval development of thickening along the right major fissure. No pneumothorax. Upper Abdomen: No acute abnormality. Musculoskeletal: No abdominal wall hernia or abnormality. No suspicious lytic or blastic osseous lesions. No acute displaced fracture. Bilateral degenerative changes of the spine. IMPRESSION: 1. Interval development of right upper and lower lobe pulmonary nodules with a subsolid 1.8 cm right lower lobe nodule. Non-contrast chest CT at 3-6 months is recommended. If nodules persist, subsequent management will be based upon the most suspicious nodule(s). This recommendation follows the consensus statement: Guidelines for  Management of Incidental Pulmonary Nodules Detected on CT Images: From the Fleischner Society 2017; Radiology 2017; 284:228-243. 2. Likely stable but slightly more conspicuous left lower lobe and left upper lobe pulmonary nodules measuring up to 8 mm. 3. Interval development of thickening along the right major fissure. Recommend attention on follow-up. 4. Bilateral trace pleural effusions, right greater than left. 5. Bilateral calcified plaques consistent with asbestos exposure. 6.  Aortic Atherosclerosis (ICD10-I70.0). Electronically Signed   By: Iven Finn M.D.   On: 04/13/2021 22:28   CT PELVIS WO CONTRAST  Result Date: 04/17/2021 CLINICAL DATA:  Stage IV sacral decubitus ulcer. Clinical concern for osteomyelitis. EXAM: CT PELVIS  WITHOUT CONTRAST TECHNIQUE: Multidetector CT imaging of the pelvis was performed following the standard protocol without intravenous contrast. RADIATION DOSE REDUCTION: This exam was performed according to the departmental dose-optimization program which includes automated exposure control, adjustment of the mA and/or kV according to patient size and/or use of iterative reconstruction technique. COMPARISON:  02/06/2021 FINDINGS: Urinary Tract: Portions of the urinary bladder and distal ureters are obscured by streak artifact from hip prostheses. No visible abnormalities. Bowel: Portions obscured by the streak artifacts from the prostheses. Large amount of stool in the rectum with mild diffuse low density inferior rectal wall thickening. There is also a moderate amount of stool elsewhere in the included portions of the colon. The visualized small bowel is unremarkable. Grossly normal appendix. Vascular/Lymphatic: Atheromatous arterial calcifications without aneurysm. No enlarged lymph nodes. Reproductive:  Prostate obscured by the streak artifacts. Other: Anterior abdominal wall hernia repair mesh at the level of the inferior pelvis. Diffuse subcutaneous edema. Musculoskeletal:  Sacral decubitus ulcer centered to the left of midline and extending close to the inferior aspect of the coccyx. No bone destruction or periosteal reaction. No soft tissue gas. Bilateral hip prostheses with associated streak artifacts. Lower lumbar spine degenerative changes. IMPRESSION: 1. Sacral decubitus ulcer without evidence of underlying osteomyelitis. 2. Rectal fecal impaction with associated mild diffuse low density wall thickening involving the distal rectum, compatible with stercoral proctitis. Electronically Signed   By: Claudie Revering M.D.   On: 04/16/2021 18:58   DG Chest Port 1 View  Result Date: 04/13/2021 CLINICAL DATA:  Possible sepsis.  Fevers. EXAM: PORTABLE CHEST 1 VIEW COMPARISON:  02/06/2021 PET-CT dated 01/18/2017. FINDINGS: Stable enlarged cardiac silhouette. The aorta remains mildly tortuous and calcified. Mild increase in patchy density in both lungs with more pronounced patchy density in the right lower lung zone. Bilateral calcified pleural plaques are again demonstrated. Old distal right clavicle fracture. Mild to moderate left glenohumeral joint degenerative changes. IMPRESSION: 1. Mild-to-moderate bilateral multilobar pneumonia. 2. Stable cardiomegaly. 3. Stable bilateral calcified pleural plaques compatible with previous asbestos exposure. Electronically Signed   By: Claudie Revering M.D.   On: 04/21/2021 19:10    Procedures Procedures    CRITICAL CARE Performed by: Wandra Arthurs   Total critical care time: 30 minutes  Critical care time was exclusive of separately billable procedures and treating other patients.  Critical care was necessary to treat or prevent imminent or life-threatening deterioration.  Critical care was time spent personally by me on the following activities: development of treatment plan with patient and/or surrogate as well as nursing, discussions with consultants, evaluation of patient's response to treatment, examination of patient, obtaining history  from patient or surrogate, ordering and performing treatments and interventions, ordering and review of laboratory studies, ordering and review of radiographic studies, pulse oximetry and re-evaluation of patient's condition.   Medications Ordered in ED Medications  lactated ringers infusion ( Intravenous New Bag/Given 04/15/2021 2127)  sodium phosphate (FLEET) 7-19 GM/118ML enema 1 enema (has no administration in time range)  carbidopa-levodopa (SINEMET IR) 25-100 MG per tablet immediate release 1.5 tablet (has no administration in time range)  HYDROcodone-acetaminophen (NORCO/VICODIN) 5-325 MG per tablet 1 tablet (has no administration in time range)  timolol (TIMOPTIC) 0.5 % ophthalmic solution 1 drop (has no administration in time range)  acetaminophen (TYLENOL) tablet 650 mg (has no administration in time range)  bisacodyl (DULCOLAX) suppository 10 mg (has no administration in time range)  ondansetron (ZOFRAN) tablet 4 mg (has no administration in time range)  Or  ondansetron (ZOFRAN) injection 4 mg (has no administration in time range)  Ampicillin-Sulbactam (UNASYN) 3 g in sodium chloride 0.9 % 100 mL IVPB (has no administration in time range)  remdesivir 200 mg in sodium chloride 0.9% 250 mL IVPB (has no administration in time range)  remdesivir 100 mg in sodium chloride 0.9 % 100 mL IVPB (has no administration in time range)  methylPREDNISolone sodium succinate (SOLU-MEDROL) 40 mg/mL injection 40 mg (has no administration in time range)  enoxaparin (LOVENOX) injection 75 mg (has no administration in time range)  sodium chloride 0.9 % bolus 1,000 mL (0 mLs Intravenous Stopped 04/11/2021 1953)  ceFEPIme (MAXIPIME) 2 g in sodium chloride 0.9 % 100 mL IVPB (0 g Intravenous Stopped 03/31/2021 1852)  vancomycin (VANCOREADY) IVPB 1500 mg/300 mL (0 mg Intravenous Stopped 04/09/2021 2114)  iohexol (OMNIPAQUE) 300 MG/ML solution 75 mL (75 mLs Intravenous Contrast Given 04/21/2021 2153)    ED Course/ Medical  Decision Making/ A&P                           Medical Decision Making Diarra I Meinzer is a 88 y.o. male history of dementia, here presenting with altered mental status and worsening stage IV decub ulcer.  Patient is from a nursing home.  Patient is febrile and altered.  Patient also was hypoxic and dropped his sats to about 88%.  Concern for possible pneumonia versus osteomyelitis of the sacral decub ulcer. We will do sepsis work-up with CBC CMP and lactate and cultures and CT pelvis and also CT head.  We will send COVID test as well  9 pm Patient is COVID-positive.  Patient was given remdesivir.  Patient also has multilobar pneumonia on chest x-ray.  No osteomyelitis on CT pelvis.  Patient does have a fecal impaction.  I did order an enema.  Patient also received broad-spectrum antibiotics.  Patient will be admitted for hypoxia from COVID, pneumonia, stage IV sacral decub ulcer.  Problems Addressed: COVID-19: acute illness or injury Fecal impaction [facility]): acute illness or injury Hypoxia: acute illness or injury Sacral decubitus ulcer, stage IV ([place]): chronic illness or injury with exacerbation, progression, or side effects of treatment  Amount and/or Complexity of Data Reviewed Labs: ordered. Radiology: ordered. ECG/medicine tests: ordered.  Risk OTC drugs. Prescription drug management. Decision regarding hospitalization.  Final Clinical Impression(s) / ED Diagnoses Final diagnoses:  COVID-19  Sacral decubitus ulcer, stage IV ([place])  Hypoxia  Fecal impaction [facility])    Rx / DC Orders ED Discharge Orders     None         Drenda Freeze, MD 04/13/2021 2248

## 2021-03-25 NOTE — Progress Notes (Signed)
A consult was received from an ED physician for Cefepime & Vancomycin per pharmacy dosing.  The patient's profile has been reviewed for ht/wt/allergies/indication/available labs.   A one time order has been placed for Cefepime 2gm & Vancomycin 1500mg  IV.   Cephalexin intolerance noted but patient has received IV cephalosporins previously with no noted reaction.   Further antibiotics/pharmacy consults should be ordered by admitting physician if indicated.                       Thank you, Netta Cedars PharmD 04/12/2021  5:54 PM

## 2021-03-25 NOTE — Assessment & Plan Note (Addendum)
Advanced PD with dementia, significant cerebral atrophy on CT head as well. Care transitioned to comfort care.  Sinemet discontinued

## 2021-03-25 NOTE — H&P (Signed)
History and Physical    Patient: Chad Russell LHT:[id number] DOB: 03/17/1926 DOA: 04/12/2021 DOS: the patient was seen and examined on 04/12/2021 PCP: Garwin Brothers, MD  Patient coming from: SNF  Chief Complaint:  Chief Complaint  Patient presents with   Altered Mental Status    HPI: Chad Russell is a 88 y.o. male with medical history significant of Parkinson's disease and dementia.  Chronic debility, bed bound at baseline, malnutrition, suspected chronic aspiration, A.Fib on Xarelto.  Recent admit in Dec for multifocal PNA, COVID neg at that time, suspected to have aspiration PNA.  Pt non-ambulatory at baseline with stage 4 decubitus on buttocks / sacrum.  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  Pt presents to ED today from SNF with increased confusion compared to baseline.  Pt also noted to have fever.  Pt unable to contribute significantly to HPI.  Review of Systems: unable to review all systems due to the inability of the patient to answer questions. Past Medical History:  Diagnosis Date   Arthritis    Atrial fibrillation [facility])    Balance problem 10/31/2013   BPH (benign prostatic hyperplasia) 2014   Urology Dr [name] with [facility] [phone]    Bradycardia    a. baseline HR in the 30's. Asymptomatic - no history of PPM placement.    CAD (coronary artery disease)    LHC 03/16/11: Mid LAD 10-20%, proximal circumflex 10%, mid RCA 20%, EF 55%.   Cataract    Complete heart block (HCC)    Fracture of femoral neck, right ([place]) 04/19/2017   GERD (gastroesophageal reflux disease)    takes occasional  zantac   Hx of echocardiogram    Echo 02/2011: EF 65-70%, normal wall motion, MAC, mild MR, mild LAE, mild RVE   Hypertension    OSA (obstructive sleep apnea)    per patient went to have sleep study done about 2 years ago, bu they never F/U with him about results; but wife reports he still has periods of apnea at night    Parkinson disease ([place])    Talar fracture    casted no surg    UTI (urinary tract infection) 10/2020   Past Surgical History:  Procedure Laterality Date   ABDOMINAL AORTOGRAM W/LOWER EXTREMITY N/A 01/17/2017   Procedure: ABDOMINAL AORTOGRAM W/LOWER EXTREMITY;  Surgeon: Angelia Mould, MD;  Location: [place] [facility];  Service: Cardiovascular;  Laterality: N/A;   COLONOSCOPY     Repeated and normal in 2011   FRACTURE SURGERY     HEMIARTHROPLASTY HIP Right 04/19/2017   HEMORROIDECTOMY     HIP ARTHROPLASTY Right 04/19/2017   Procedure: ARTHROPLASTY BIPOLAR HIP (HEMIARTHROPLASTY);  Surgeon: Marchia Bond, MD;  Location: [place];  Service: Orthopedics;  Laterality: Right;   HIP ARTHROPLASTY Left 12/16/2018   Procedure: ARTHROPLASTY BIPOLAR HIP (HEMIARTHROPLASTY);  Surgeon: Altamese Hicksville, MD;  Location: [place];  Service: Orthopedics;  Laterality: Left;   INGUINAL HERNIA REPAIR Right 02/26/2014   Procedure: LAPAROSCOPIC RIGHT INGUINAL HERNIA REPAIR;  Surgeon: Ralene Ok, MD;  Location: [place];  Service: General;  Laterality: Right;   INGUINAL HERNIA REPAIR     INSERTION OF MESH Right 02/26/2014   Procedure: INSERTION OF MESH;  Surgeon: Ralene Ok, MD;  Location: [place];  Service: General;  Laterality: Right;   LEFT HEART CATHETERIZATION WITH CORONARY ANGIOGRAM N/A 03/16/2011   Procedure: LEFT HEART CATHETERIZATION WITH CORONARY ANGIOGRAM;  Surgeon: Peter M Martinique, MD;  Location: [facility];  Service: Cardiovascular;  Laterality:  N/A;   PROSTATE BIOPSY     negative - [facility]   TRANSURETHRAL RESECTION OF PROSTATE N/A 09/07/2016   Procedure: TRANSURETHRAL RESECTION OF THE PROSTATE (TURP);  Surgeon: Irine Seal, MD;  Location: [facility];  Service: Urology;  Laterality: N/A;   Social History:  reports that he quit smoking about 66 years ago. His smoking use included cigarettes. He has a 3.00 pack-year smoking history. He has quit using smokeless tobacco. He reports current alcohol use of about 14.0 standard drinks per week. He reports that he  does not use drugs.  Allergies  Allergen Reactions   Doxycycline Other (See Comments)    Upset stomach   Keflex [Cephalexin] Other (See Comments)    Abdominal discomfort 04-19-17 Pt has tolerated orally; tolerated IV Rocephin    Family History  Problem Relation Age of Onset   Hypertension Mother    Multiple sclerosis Sister     Prior to Admission medications   Medication Sig Start Date End Date Taking? Authorizing Provider  acetaminophen (TYLENOL) 325 MG tablet Take 650 mg by mouth every 6 (six) hours as needed for mild pain.   Yes [provider]  Apoaequorin (PREVAGEN) 10 MG CAPS Take 1 capsule by mouth daily.   Yes [provider]  calcium carbonate (TUMS - DOSED IN MG ELEMENTAL CALCIUM) 500 MG chewable tablet Chew 1 tablet (200 mg of elemental calcium total) by mouth 2 (two) times daily with a meal. Patient taking differently: Chew 1 tablet by mouth 2 (two) times daily. 10/20/20  Yes Sheikh, Omair Latif, DO  carbidopa-levodopa (SINEMET) 25-100 MG tablet Take 1.5 tablets by mouth in the morning and at bedtime. 06/11/19  Yes Patel, Donika K, DO  HYDROcodone-acetaminophen (NORCO/VICODIN) 5-325 MG tablet Take 1 tablet by mouth every 4 (four) hours as needed for moderate pain.   Yes [provider]  lisinopril (ZESTRIL) 10 MG tablet Take 1 tablet (10 mg total) by mouth daily. 10/24/20 04/03/2021 Yes Lendon Colonel, NP  magnesium oxide (MAG-OX) 400 MG tablet Take 400 mg by mouth daily. **one tablet by mouth every other day**   Yes [provider]  Multiple Vitamins-Minerals (PRESERVISION AREDS 2+MULTI VIT PO) Take 1 capsule by mouth 2 (two) times daily.   Yes [provider]  Nutritional Supplements (PROSTATE PO) Take 1 tablet by mouth in the morning, at noon, and at bedtime.   Yes [provider]  timolol (TIMOPTIC) 0.5 % ophthalmic solution INSTILL ONE DROP IN [facility] EYE DAILY Patient taking differently: Place 1 drop into both eyes at  bedtime. 09/08/20  Yes Rankin, Clent Demark, MD  vitamin C (ASCORBIC ACID) 500 MG tablet Take 500 mg by mouth daily.   Yes [provider]  XARELTO 20 MG TABS tablet TAKE ONE TABLET BY MOUTH DAILY WITH SUPPER Patient taking differently: Take 20 mg by mouth daily with supper. 01/06/21  Yes Minus Breeding, MD  feeding supplement (ENSURE ENLIVE / ENSURE PLUS) LIQD Take 237 mLs by mouth daily. Patient not taking: Reported on 02/25/2021 10/20/20   Kerney Elbe, DO    Physical Exam: Vitals:   04/04/2021 1915 03/27/2021 1930 04/20/2021 2000 04/13/2021 2021  BP: (!) 151/103 140/83 120/68   Pulse: 68 70 (!) 50   Resp: _0 Temp:    98.1 F (36.7 C)  TempSrc:    Oral  SpO2: 99% 98% 92%   Weight:       Constitutional: NAD, calm, comfortable, chronically ill appearing with temporal  muscle wasting Eyes: PERRL, lids and conjunctivae normal ENMT: Mucous membranes are moist. Posterior pharynx clear of any exudate or lesions.Normal dentition.  Neck: normal, supple, no masses, no thyromegaly Respiratory: Diminished breath sounds bilateral bases Cardiovascular: IRR, IRR Abdomen: no tenderness, no masses palpated. No hepatosplenomegaly. Bowel sounds positive.  Musculoskeletal: no clubbing / cyanosis. No joint deformity upper and lower extremities. Good ROM, no contractures. Normal muscle tone.  Skin: Stage 4 sacral decubitus. Neurologic: MAE Psychiatric: Confused  Data Reviewed:  Multifocal PNA on CXR. Fecal impaction on CT pelvis. Sacral decubitus on CT pelvis with no obvious findings of cellulitis or osteomyelitis.  Assessment and Plan: * Multifocal pneumonia- (present on admission) Not sure how much of his multifocal PNA is viral from COVID, and how much is bacterial from aspiration in setting of suspected chronic aspiration from advanced parkinson's dz and dementia. Getting CT chest to see if we can clarify appearance of PNA (COVID vs aspiration / bacterial vs both), and exclude any  other obvious process (obvious neoplasm, etc).] Treating both at this time. Got cefepime + Vanc in ED, will switch to empiric unasyn Procalcitonin pending MRSA PCR pending  COVID-19 virus infection- (present on admission) COVID-19 is positive. COVID pathway Empiric remdesivir. Solumedrol for the moment given new O2 requirement. Daily labs. See multifocal PNA as well.  Acute respiratory failure with hypoxia ([place])- (present on admission) New O2 requirement in setting of PNA + Fever + COVID positive.  Sacral decubitus ulcer- (present on admission) No cellulitis or osteomyelitis on CT. Wound care consult. Fever and hypoxia most likely from lung process.  Constipation- (present on admission) EDP giving fleets enema. If that doesn't work EDP planning on disimpacting.  Atrial fibrillation ([place])- (present on admission) Holding Xarelto while NPO. Lovenox bridge while waiting for SLP eval.  Parkinson disease ([place])- (present on admission) Advanced PD with dementia. Holding sinemet due to NPO status, resume if able to take POs with SLP in AM.       Advance Care Planning:   Code Status: Full Code Confirmed with wife.  She seems to feel that he will get better with proper care, wants a full court press at this time.  May wish to get pal care involvement during stay.  Consults: None  Family Communication: Wife at bedside  Severity of Illness: The appropriate patient status for this patient is INPATIENT. Inpatient status is judged to be reasonable and necessary in order to provide the required intensity of service to ensure the patient's safety. The patient's presenting symptoms, physical exam findings, and initial radiographic and laboratory data in the context of their chronic comorbidities is felt to place them at high risk for further clinical deterioration. Furthermore, it is not anticipated that the patient will be medically stable for discharge from the hospital within 2  midnights of admission.   * I certify that at the point of admission it is my clinical judgment that the patient will require inpatient hospital care spanning beyond 2 midnights from the point of admission due to high intensity of service, high risk for further deterioration and high frequency of surveillance required.*  Author: Etta Quill., DO 04/05/2021 9:37 PM  For on call review [email or web address].

## 2021-03-25 NOTE — Progress Notes (Signed)
Pharmacy Antibiotic Note  Chad Russell is a 88 y.o. male admitted on 03/31/2021 with aspiration pneumonia.  Pharmacy has been consulted for unasyn dosing.  Plan: Unasyn 3g IV q6h Follow up renal function & cultures  Weight: 74.8 kg (165 lb)  Temp (24hrs), Avg:99.8 F (37.7 C), Min:98.1 F (36.7 C), Max:101.4 F (38.6 C)  Recent Labs  Lab 04/07/2021 1749 04/08/2021 1823  WBC 9.7  --   CREATININE 0.76 0.80  LATICACIDVEN 1.4  --     Estimated Creatinine Clearance: 62.3 mL/min (by C-G formula based on SCr of 0.8 mg/dL).    Allergies  Allergen Reactions   Doxycycline Other (See Comments)    Upset stomach   Keflex [Cephalexin] Other (See Comments)    Abdominal discomfort 04-19-17 Pt has tolerated orally; tolerated IV Rocephin    Antimicrobials this admission: 2/1 Vanc/Cefepime x 1 2/1 Unasyn >> 2/1 Remdesivir >> (2/5)  Dose adjustments this admission:  Microbiology results: 2/1 COVID+ 2/1 MRSA PCR: 2/1 BCx: 2/1 UCx:  Thank you for allowing pharmacy to be a part of this patients care.  Peggyann Juba, PharmD, BCPS Pharmacy: [phone] 04/13/2021 9:33 PM

## 2021-03-25 NOTE — Assessment & Plan Note (Signed)
Delirium due to PNA / COVID on top of chronic parkinson's dementia.

## 2021-03-25 NOTE — Assessment & Plan Note (Deleted)
-   s/p enema in ER

## 2021-03-25 NOTE — Assessment & Plan Note (Addendum)
Completed course of antibiotics and remdesivir. Care transitioned to comfort care.

## 2021-03-25 NOTE — Assessment & Plan Note (Addendum)
-   continue remdesivir x 5 days (completed on 2/5) - CT value 33 on admission  - fax received from [facility] that his covid test was positive on 03/01/21 and he completed isolation as well; will d/c isolation at this time

## 2021-03-25 NOTE — ED Triage Notes (Signed)
Pt BIB via EMS from [facility] with [facility]. Per EMS, they were called out due to pt being lethargic and stage 4 ulcer on bottom. Pt had has fevers at nursing home and received tylenol from nursing home. Pt received 500cc of NS.

## 2021-03-25 NOTE — Assessment & Plan Note (Addendum)
Worsening hypoxia due to aspiration, PNA, COVID, severe deconditioning.  Continue supplemental oxygen.

## 2021-03-26 DIAGNOSIS — J189 Pneumonia, unspecified organism: Secondary | ICD-10-CM | POA: Diagnosis not present

## 2021-03-26 DIAGNOSIS — R911 Solitary pulmonary nodule: Secondary | ICD-10-CM

## 2021-03-26 DIAGNOSIS — U071 COVID-19: Secondary | ICD-10-CM | POA: Diagnosis not present

## 2021-03-26 DIAGNOSIS — G9341 Metabolic encephalopathy: Secondary | ICD-10-CM | POA: Diagnosis not present

## 2021-03-26 DIAGNOSIS — J9601 Acute respiratory failure with hypoxia: Secondary | ICD-10-CM | POA: Diagnosis not present

## 2021-03-26 LAB — CBC WITH DIFFERENTIAL/PLATELET
Abs Immature Granulocytes: 0.1 10*3/uL — ABNORMAL HIGH (ref 0.00–0.07)
Basophils Absolute: 0 10*3/uL (ref 0.0–0.1)
Basophils Relative: 0 %
Eosinophils Absolute: 0 10*3/uL (ref 0.0–0.5)
Eosinophils Relative: 0 %
HCT: 30.6 % — ABNORMAL LOW (ref 39.0–52.0)
Hemoglobin: 9.9 g/dL — ABNORMAL LOW (ref 13.0–17.0)
Immature Granulocytes: 1 %
Lymphocytes Relative: 12 %
Lymphs Abs: 1.1 10*3/uL (ref 0.7–4.0)
MCH: 29.9 pg (ref 26.0–34.0)
MCHC: 32.4 g/dL (ref 30.0–36.0)
MCV: 92.4 fL (ref 80.0–100.0)
Monocytes Absolute: 0.2 10*3/uL (ref 0.1–1.0)
Monocytes Relative: 2 %
Neutro Abs: 8.2 10*3/uL — ABNORMAL HIGH (ref 1.7–7.7)
Neutrophils Relative %: 85 %
Platelets: 288 10*3/uL (ref 150–400)
RBC: 3.31 MIL/uL — ABNORMAL LOW (ref 4.22–5.81)
RDW: 17.8 % — ABNORMAL HIGH (ref 11.5–15.5)
WBC: 9.6 10*3/uL (ref 4.0–10.5)
nRBC: 0 % (ref 0.0–0.2)

## 2021-03-26 LAB — COMPREHENSIVE METABOLIC PANEL
ALT: 24 U/L (ref 0–44)
AST: 28 U/L (ref 15–41)
Albumin: 2.4 g/dL — ABNORMAL LOW (ref 3.5–5.0)
Alkaline Phosphatase: 69 U/L (ref 38–126)
Anion gap: 6 (ref 5–15)
BUN: 23 mg/dL (ref 8–23)
CO2: 25 mmol/L (ref 22–32)
Calcium: 8 mg/dL — ABNORMAL LOW (ref 8.9–10.3)
Chloride: 103 mmol/L (ref 98–111)
Creatinine, Ser: 0.77 mg/dL (ref 0.61–1.24)
GFR, Estimated: >60 mL/min (ref >60)
Glucose, Bld: 131 mg/dL — ABNORMAL HIGH (ref 70–99)
Potassium: 4.4 mmol/L (ref 3.5–5.1)
Sodium: 134 mmol/L — ABNORMAL LOW (ref 135–145)
Total Bilirubin: 0.6 mg/dL (ref 0.3–1.2)
Total Protein: 6 g/dL — ABNORMAL LOW (ref 6.5–8.1)

## 2021-03-26 LAB — PROCALCITONIN: Procalcitonin: <0.1 ng/mL

## 2021-03-26 LAB — MRSA NEXT GEN BY PCR, NASAL: MRSA by PCR Next Gen: DETECTED — AB

## 2021-03-26 LAB — D-DIMER, QUANTITATIVE: D-Dimer, Quant: 1.32 ug/mL-FEU — ABNORMAL HIGH (ref 0.00–0.50)

## 2021-03-26 LAB — C-REACTIVE PROTEIN
CRP: 10.6 mg/dL — ABNORMAL HIGH (ref <1.0)
CRP: 11.6 mg/dL — ABNORMAL HIGH (ref <1.0)

## 2021-03-26 MED ORDER — METHYLPREDNISOLONE SODIUM SUCC 40 MG IJ SOLR
40.0000 mg | Freq: Two times a day (BID) | INTRAMUSCULAR | Status: DC
  Administered 2021-03-26 – 2021-04-03 (×16): 40 mg via INTRAVENOUS
  Filled 2021-03-26 (×17): qty 1

## 2021-03-26 MED ORDER — SENNOSIDES-DOCUSATE SODIUM 8.6-50 MG PO TABS
2.0000 | ORAL_TABLET | Freq: Two times a day (BID) | ORAL | Status: DC
  Administered 2021-03-27 – 2021-04-05 (×16): 2 via ORAL
  Filled 2021-03-26 (×18): qty 2

## 2021-03-26 MED ORDER — LACTULOSE 10 GM/15ML PO SOLN
10.0000 g | Freq: Three times a day (TID) | ORAL | Status: DC
  Administered 2021-03-27 – 2021-03-31 (×12): 10 g via ORAL
  Filled 2021-03-26 (×13): qty 30

## 2021-03-26 MED ORDER — GERHARDT'S BUTT CREAM
TOPICAL_CREAM | Freq: Every day | CUTANEOUS | Status: AC
  Filled 2021-03-26: qty 1

## 2021-03-26 MED ORDER — CARBIDOPA-LEVODOPA 25-100 MG PO TABS
1.0000 | ORAL_TABLET | Freq: Four times a day (QID) | ORAL | Status: DC
  Administered 2021-03-26 – 2021-04-05 (×42): 1 via ORAL
  Filled 2021-03-26 (×44): qty 1

## 2021-03-26 MED ORDER — LISINOPRIL 10 MG PO TABS
10.0000 mg | ORAL_TABLET | Freq: Every day | ORAL | Status: DC
  Administered 2021-03-26 – 2021-04-01 (×7): 10 mg via ORAL
  Filled 2021-03-26 (×7): qty 1

## 2021-03-26 MED ORDER — POLYETHYLENE GLYCOL 3350 17 G PO PACK
17.0000 g | PACK | Freq: Two times a day (BID) | ORAL | Status: DC
  Administered 2021-03-27 – 2021-04-05 (×14): 17 g via ORAL
  Filled 2021-03-26 (×17): qty 1

## 2021-03-26 NOTE — ED Notes (Signed)
ED TO INPATIENT HANDOFF REPORT  ED Nurse Name and Phone #:   S Name/Age/Gender Chad Russell 88 y.o. male Room/Bed: WA08/WA08  Code Status   Code Status: Full Code  Home/SNF/Other Skilled nursing facility Patient oriented to: sel  Triage Complete: Triage complete  Chief Complaint Sepsis due to pneumonia ([name]) [J18.9, A41.9]  Triage Note Pt BIB via EMS from [employer] [facility] with [place] [facility]. Per EMS, they were called out due to pt being lethargic and stage 4 ulcer on bottom. Pt had has fevers at nursing home and received tylenol from nursing home. Pt received 500cc of NS.    Allergies Allergies  Allergen Reactions   Doxycycline Other (See Comments)    Upset stomach   Keflex [Cephalexin] Other (See Comments)    Abdominal discomfort 04-19-17 Pt has tolerated orally; tolerated IV Rocephin    Level of Care/Admitting Diagnosis ED Disposition     ED Disposition  Admit   Condition  --   [place]: [place] [[id number]]  Level of Care: Progressive [102]  Admit to Progressive based on following criteria: MULTISYSTEM THREATS such as stable sepsis, metabolic/electrolyte imbalance with or without encephalopathy that is responding to early treatment.  May admit patient to [facility] or [facility] if equivalent level of care is available:: No  Covid Evaluation: Asymptomatic Screening Protocol (No Symptoms)  Diagnosis: Sepsis due to pneumonia [facility]) [[id number]]  Admitting Physician: Etta Quill [[id number]]  Attending Physician: Etta Quill [[id number]]  Estimated length of stay: past midnight tomorrow  Certification:: I certify this patient will need inpatient services for at least 2 midnights          B Medical/Surgery History Past Medical History:  Diagnosis Date   Arthritis    Atrial fibrillation ([place])    Balance problem 10/31/2013   BPH (benign prostatic hyperplasia) 2014   Urology Dr [name] with [facility] [phone]    Bradycardia    a.  baseline HR in the 30's. Asymptomatic - no history of PPM placement.    CAD (coronary artery disease)    LHC 03/16/11: Mid LAD 10-20%, proximal circumflex 10%, mid RCA 20%, EF 55%.   Cataract    Complete heart block (H[facility])    Fracture of femoral neck, right ([place]) 04/19/2017   GERD (gastroesophageal reflux disease)    takes occasional  zantac   Hx of echocardiogram    Echo 02/2011: EF 65-70%, normal wall motion, MAC, mild MR, mild LAE, mild RVE   Hypertension    OSA (obstructive sleep apnea)    per patient went to have sleep study done about 2 years ago, bu they never F/U with him about results; but wife reports he still has periods of apnea at night    Parkinson disease ([place])    Talar fracture    casted no surg   UTI (urinary tract infection) 10/2020   Past Surgical History:  Procedure Laterality Date   ABDOMINAL AORTOGRAM W/LOWER EXTREMITY N/A 01/17/2017   Procedure: ABDOMINAL AORTOGRAM W/LOWER EXTREMITY;  Surgeon: Angelia Mould, MD;  Location: [place] CV [facility];  Service: Cardiovascular;  Laterality: N/A;   COLONOSCOPY     Repeated and normal in 2011   FRACTURE SURGERY     HEMIARTHROPLASTY HIP Right 04/19/2017   HEMORROIDECTOMY     HIP ARTHROPLASTY Right 04/19/2017   Procedure: ARTHROPLASTY BIPOLAR HIP (HEMIARTHROPLASTY);  Surgeon: Marchia Bond, MD;  Location: [facility];  Service: Orthopedics;  Laterality: Right;   HIP ARTHROPLASTY Left 12/16/2018  Procedure: ARTHROPLASTY BIPOLAR HIP (HEMIARTHROPLASTY);  Surgeon: Altamese Kaka, MD;  Location: [place];  Service: Orthopedics;  Laterality: Left;   INGUINAL HERNIA REPAIR Right 02/26/2014   Procedure: LAPAROSCOPIC RIGHT INGUINAL HERNIA REPAIR;  Surgeon: Ralene Ok, MD;  Location: [place];  Service: General;  Laterality: Right;   INGUINAL HERNIA REPAIR     INSERTION OF MESH Right 02/26/2014   Procedure: INSERTION OF MESH;  Surgeon: Ralene Ok, MD;  Location: [place];  Service: General;  Laterality: Right;   LEFT HEART  CATHETERIZATION WITH CORONARY ANGIOGRAM N/A 03/16/2011   Procedure: LEFT HEART CATHETERIZATION WITH CORONARY ANGIOGRAM;  Surgeon: Peter M Martinique, MD;  Location: [facility] CATH LAB;  Service: Cardiovascular;  Laterality: N/A;   PROSTATE BIOPSY     negative - [facility]   TRANSURETHRAL RESECTION OF PROSTATE N/A 09/07/2016   Procedure: TRANSURETHRAL RESECTION OF THE PROSTATE (TURP);  Surgeon: Irine Seal, MD;  Location: [facility];  Service: Urology;  Laterality: N/A;     A IV Location/Drains/Wounds Patient Lines/Drains/Airways Status     Active Line/Drains/Airways     Name Placement date Placement time Site Days   Peripheral IV 02/08/21 22 G 1" Anterior;Left;Lateral Forearm 02/08/21  1038  Forearm  46   Peripheral IV 04/02/2021 20 G Right Antecubital 03/29/2021  1745  Antecubital  1   External Urinary Catheter 10/17/20  0700  --  160   External Urinary Catheter 02/10/21  2045  --  44   Pressure Injury 02/08/21 Sacrum Medial Unstageable - Full thickness tissue loss in which the base of the injury is covered by slough (yellow, tan, gray, green or brown) and/or eschar (tan, brown or black) in the wound bed. 02/08/21  1455  -- 46   Pressure Injury 02/09/21 Tibial Distal;Posterior;Right Deep Tissue Pressure Injury - Purple or maroon localized area of discolored intact skin or blood-filled blister due to damage of underlying soft tissue from pressure and/or shear. 02/09/21  1456  -- 45   Pressure Injury 02/09/21 Foot Anterior;Right Stage 2 -  Partial thickness loss of dermis presenting as a shallow open injury with a red, pink wound bed without slough. 02/09/21  1456  -- 45   Pressure Injury 02/08/21 Buttocks Right;Upper Stage 2 -  Partial thickness loss of dermis presenting as a shallow open injury with a red, pink wound bed without slough. 02/08/21  1456  -- 46            Intake/Output Last 24 hours  Intake/Output Summary (Last 24 hours) at 03/26/2021 1920 Last data filed at 03/26/2021 1601 Gross per 24  hour  Intake 3506 ml  Output --  Net 3506 ml    Labs/Imaging Results for orders placed or performed during the hospital encounter of 03/27/2021 (from the past 48 hour(s))  Lactic acid, plasma     Status: None   Collection Time: 03/29/2021  5:49 PM  Result Value Ref Range   Lactic Acid, Venous 1.4 0.5 - 1.9 mmol/L    Comment: Performed at [facility], [place] [address] [place], [place] [address]  Comprehensive metabolic panel     Status: Abnormal   Collection Time: 04/16/2021  5:49 PM  Result Value Ref Range   Sodium 133 (L) 135 - 145 mmol/L   Potassium 4.0 3.5 - 5.1 mmol/L   Chloride 101 98 - 111 mmol/L   CO2 25 22 - 32 mmol/L   Glucose, Bld 119 (H) 70 - 99 mg/dL    Comment: Glucose reference range applies  only to samples taken after fasting for at least 8 hours.   BUN 29 (H) 8 - 23 mg/dL   Creatinine, Ser 0.76 0.61 - 1.24 mg/dL   Calcium 8.2 (L) 8.9 - 10.3 mg/dL   Total Protein 6.3 (L) 6.5 - 8.1 g/dL   Albumin 2.5 (L) 3.5 - 5.0 g/dL   AST 35 15 - 41 U/L   ALT 17 0 - 44 U/L   Alkaline Phosphatase 85 38 - 126 U/L   Total Bilirubin 0.9 0.3 - 1.2 mg/dL   GFR, Estimated >60 >60 mL/min    Comment: (NOTE) Calculated using the CKD-EPI Creatinine Equation (2021)    Anion gap 7 5 - 15    Comment: Performed at [facility], [place] [address] [place], [place] [address]  CBC WITH DIFFERENTIAL     Status: Abnormal   Collection Time: 04/20/2021  5:49 PM  Result Value Ref Range   WBC 9.7 4.0 - 10.5 K/uL   RBC 3.29 (L) 4.22 - 5.81 MIL/uL   Hemoglobin 9.8 (L) 13.0 - 17.0 g/dL   HCT 30.4 (L) 39.0 - 52.0 %   MCV 92.4 80.0 - 100.0 fL   MCH 29.8 26.0 - 34.0 pg   MCHC 32.2 30.0 - 36.0 g/dL   RDW 17.6 (H) 11.5 - 15.5 %   Platelets 313 150 - 400 K/uL   nRBC 0.0 0.0 - 0.2 %   Neutrophils Relative % 68 %   Neutro Abs 6.6 1.7 - 7.7 K/uL   Lymphocytes Relative 16 %   Lymphs Abs 1.6 0.7 - 4.0 K/uL   Monocytes Relative 14 %   Monocytes Absolute 1.3 (H) 0.1 - 1.0  K/uL   Eosinophils Relative 1 %   Eosinophils Absolute 0.1 0.0 - 0.5 K/uL   Basophils Relative 0 %   Basophils Absolute 0.0 0.0 - 0.1 K/uL   Immature Granulocytes 1 %   Abs Immature Granulocytes 0.06 0.00 - 0.07 K/uL    Comment: Performed at [facility], [place] [address], [place], [place] [address]  Protime-INR     Status: Abnormal   Collection Time: 04/21/2021  5:49 PM  Result Value Ref Range   Prothrombin Time 17.1 (H) 11.4 - 15.2 seconds   INR 1.4 (H) 0.8 - 1.2    Comment: (NOTE) INR goal varies based on device and disease states. Performed at [facility], [place] [address], [place], [place] [address]   APTT     Status: None   Collection Time: 04/11/2021  5:49 PM  Result Value Ref Range   aPTT 34 24 - 36 seconds    Comment: Performed at [facility], [place] [address], [place], [place] [address]  Blood Culture (routine x 2)     Status: None (Preliminary result)   Collection Time: 04/21/2021  5:49 PM   Specimen: BLOOD  Result Value Ref Range   Specimen Description      BLOOD BLOOD RIGHT WRIST Performed at [facility] [address], [place], [place] [address]    Special Requests      BOTTLES DRAWN AEROBIC AND ANAEROBIC Blood Culture adequate volume Performed at [place] [address] [place], [place] [address]    Culture      NO GROWTH < 12 HOURS Performed at [facility] [address] [place], [place] [address]    Report Status PENDING   Urinalysis, Routine w reflex microscopic  Status: Abnormal   Collection Time: 04/06/2021  5:49 PM  Result Value Ref Range   Color, Urine YELLOW (A) YELLOW   APPearance CLEAR (A) CLEAR   Specific Gravity, Urine 1.015 1.005 - 1.030   pH 6.5 5.0 - 8.0   Glucose, UA NEGATIVE NEGATIVE mg/dL   Hgb urine dipstick NEGATIVE NEGATIVE   Bilirubin Urine NEGATIVE NEGATIVE   Ketones, ur NEGATIVE NEGATIVE mg/dL   Protein, ur 30 (A) NEGATIVE  mg/dL   Nitrite NEGATIVE NEGATIVE   Leukocytes,Ua NEGATIVE NEGATIVE   RBC / HPF 0-5 0 - 5 RBC/hpf   WBC, UA 0-5 0 - 5 WBC/hpf   Bacteria, UA NONE SEEN NONE SEEN   Squamous Epithelial / LPF 0-5 0 - 5   Mucus PRESENT     Comment: Performed at [facility], [place] [address], [place], [place] [address]  Blood Culture (routine x 2)     Status: None (Preliminary result)   Collection Time: 04/12/2021  5:54 PM   Specimen: Right Antecubital; Blood  Result Value Ref Range   Specimen Description      RIGHT ANTECUBITAL Performed at [facility] [address], [place], [place] [address]    Special Requests      BOTTLES DRAWN AEROBIC AND ANAEROBIC Blood Culture adequate volume Performed at [facility] [address], [place], [place] [address]    Culture      NO GROWTH < 12 HOURS Performed at [facility] [facility] [address] [place], [place] [address]    Report Status PENDING   Resp Panel by RT-PCR (Flu A&B, Covid) Nasopharyngeal Swab     Status: Abnormal   Collection Time: 03/26/2021  6:20 PM   Specimen: Nasopharyngeal Swab; Nasopharyngeal(NP) swabs in vial transport medium  Result Value Ref Range   SARS Coronavirus 2 by RT PCR POSITIVE (A) NEGATIVE    Comment: (NOTE) SARS-CoV-2 target nucleic acids are DETECTED.  The SARS-CoV-2 RNA is generally detectable in upper respiratory specimens during the acute phase of infection. Positive results are indicative of the presence of the identified virus, but do not rule out bacterial infection or co-infection with other pathogens not detected by the test. Clinical correlation with patient history and other diagnostic information is necessary to determine patient infection status. The expected result is Negative.  Fact Sheet for Patients: EntrepreneurPulse.com.au  Fact Sheet for Healthcare Providers: IncredibleEmployment.be  This test is not yet  approved or cleared by the [place] FDA and  has been authorized for detection and/or diagnosis of SARS-CoV-2 by FDA under an Emergency Use Authorization (EUA).  This EUA will remain in effect (meaning this test can be used) for the duration of  the COVID-19 declaration under Section 564(b)(1) of the A ct, 21 U.S.C. section 360bbb-3(b)(1), unless the authorization is terminated or revoked sooner.     Influenza A by PCR NEGATIVE NEGATIVE   Influenza B by PCR NEGATIVE NEGATIVE    Comment: (NOTE) The Xpert Xpress SARS-CoV-2/FLU/RSV plus assay is intended as an aid in the diagnosis of influenza from Nasopharyngeal swab specimens and should not be used as a sole basis for treatment. Nasal washings and aspirates are unacceptable for Xpert Xpress SARS-CoV-2/FLU/RSV testing.  Fact Sheet for Patients: EntrepreneurPulse.com.au  Fact Sheet for Healthcare Providers: IncredibleEmployment.be  This test is not yet approved or cleared by the [place] FDA and has been authorized for detection and/or diagnosis of SARS-CoV-2 by FDA under an Emergency Use Authorization (EUA). This EUA will  remain in effect (meaning this test can be used) for the duration of the COVID-19 declaration under Section 564(b)(1) of the Act, 21 U.S.C. section 360bbb-3(b)(1), unless the authorization is terminated or revoked.  Performed at [facility], [place] [address] [place], [place] [address]   I-stat chem 8, ED (not at [facility] or [facility])     Status: Abnormal   Collection Time: 04/02/2021  6:23 PM  Result Value Ref Range   Sodium 136 135 - 145 mmol/L   Potassium 4.0 3.5 - 5.1 mmol/L   Chloride 101 98 - 111 mmol/L   BUN 26 (H) 8 - 23 mg/dL   Creatinine, Ser 0.80 0.61 - 1.24 mg/dL   Glucose, Bld 109 (H) 70 - 99 mg/dL    Comment: Glucose reference range applies only to samples taken after fasting for at least 8 hours.   Calcium, Ion 1.15 1.15 - 1.40 mmol/L    TCO2 25 22 - 32 mmol/L   Hemoglobin 9.2 (L) 13.0 - 17.0 g/dL   HCT 27.0 (L) 39.0 - 52.0 %  Procalcitonin     Status: None   Collection Time: 03/29/2021 10:44 PM  Result Value Ref Range   Procalcitonin <0.10 ng/mL    Comment:        Interpretation: PCT (Procalcitonin) <= 0.5 ng/mL: Systemic infection (sepsis) is not likely. Local bacterial infection is possible. (NOTE)       Sepsis PCT Algorithm           Lower Respiratory Tract                                      Infection PCT Algorithm    ----------------------------     ----------------------------         PCT < 0.25 ng/mL                PCT < 0.10 ng/mL          Strongly encourage             Strongly discourage   discontinuation of antibiotics    initiation of antibiotics    ----------------------------     -----------------------------       PCT 0.25 - 0.50 ng/mL            PCT 0.10 - 0.25 ng/mL               OR       >80% decrease in PCT            Discourage initiation of                                            antibiotics      Encourage discontinuation           of antibiotics    ----------------------------     -----------------------------         PCT >= 0.50 ng/mL              PCT 0.26 - 0.50 ng/mL               AND        <80% decrease in PCT             Encourage initiation of  antibiotics       Encourage continuation           of antibiotics    ----------------------------     -----------------------------        PCT >= 0.50 ng/mL                  PCT > 0.50 ng/mL               AND         increase in PCT                  Strongly encourage                                      initiation of antibiotics    Strongly encourage escalation           of antibiotics                                     -----------------------------                                           PCT <= 0.25 ng/mL                                                 OR                                         > 80% decrease in PCT                                      Discontinue / Do not initiate                                             antibiotics  Performed at [facility] [address] [place], [place] [address]   D-dimer, quantitative     Status: Abnormal   Collection Time: 04/14/2021 10:44 PM  Result Value Ref Range   D-Dimer, Quant 1.45 (H) 0.00 - 0.50 ug/mL-FEU    Comment: (NOTE) At the manufacturer cut-off value of 0.5 g/mL FEU, this assay has a negative predictive value of 95-100%.This assay is intended for use in conjunction with a clinical pretest probability (PTP) assessment model to exclude pulmonary embolism (PE) and deep venous thrombosis (DVT) in outpatients suspected of PE or DVT. Results should be correlated with clinical presentation. Performed at [facility], [place] [address], [place], [place] [address]   C-reactive protein     Status: Abnormal   Collection Time: 03/26/21  1:05 AM  Result Value Ref Range   CRP 10.6 (H) <1.0 mg/dL    Comment: Performed at [facility] [address] [place], [place] [address]  CBC with  Differential/Platelet     Status: Abnormal   Collection Time: 03/26/21  4:03 AM  Result Value Ref Range   WBC 9.6 4.0 - 10.5 K/uL   RBC 3.31 (L) 4.22 - 5.81 MIL/uL   Hemoglobin 9.9 (L) 13.0 - 17.0 g/dL   HCT 30.6 (L) 39.0 - 52.0 %   MCV 92.4 80.0 - 100.0 fL   MCH 29.9 26.0 - 34.0 pg   MCHC 32.4 30.0 - 36.0 g/dL   RDW 17.8 (H) 11.5 - 15.5 %   Platelets 288 150 - 400 K/uL   nRBC 0.0 0.0 - 0.2 %   Neutrophils Relative % 85 %   Neutro Abs 8.2 (H) 1.7 - 7.7 K/uL   Lymphocytes Relative 12 %   Lymphs Abs 1.1 0.7 - 4.0 K/uL   Monocytes Relative 2 %   Monocytes Absolute 0.2 0.1 - 1.0 K/uL   Eosinophils Relative 0 %   Eosinophils Absolute 0.0 0.0 - 0.5 K/uL   Basophils Relative 0 %   Basophils Absolute 0.0 0.0 - 0.1 K/uL   Immature Granulocytes 1 %   Abs Immature Granulocytes 0.10 (H) 0.00 -  0.07 K/uL    Comment: Performed at [facility], [place] [address] [place], [place] [address]  Comprehensive metabolic panel     Status: Abnormal   Collection Time: 03/26/21  4:03 AM  Result Value Ref Range   Sodium 134 (L) 135 - 145 mmol/L   Potassium 4.4 3.5 - 5.1 mmol/L   Chloride 103 98 - 111 mmol/L   CO2 25 22 - 32 mmol/L   Glucose, Bld 131 (H) 70 - 99 mg/dL    Comment: Glucose reference range applies only to samples taken after fasting for at least 8 hours.   BUN 23 8 - 23 mg/dL   Creatinine, Ser 0.77 0.61 - 1.24 mg/dL   Calcium 8.0 (L) 8.9 - 10.3 mg/dL   Total Protein 6.0 (L) 6.5 - 8.1 g/dL   Albumin 2.4 (L) 3.5 - 5.0 g/dL   AST 28 15 - 41 U/L   ALT 24 0 - 44 U/L   Alkaline Phosphatase 69 38 - 126 U/L   Total Bilirubin 0.6 0.3 - 1.2 mg/dL   GFR, Estimated >60 >60 mL/min    Comment: (NOTE) Calculated using the CKD-EPI Creatinine Equation (2021)    Anion gap 6 5 - 15    Comment: Performed at [facility], [place] [address] [place], [place] [address]  C-reactive protein     Status: Abnormal   Collection Time: 03/26/21  4:03 AM  Result Value Ref Range   CRP 11.6 (H) <1.0 mg/dL    Comment: Performed at [place] [address], [place], [place] [address]  D-dimer, quantitative     Status: Abnormal   Collection Time: 03/26/21  4:03 AM  Result Value Ref Range   D-Dimer, Quant 1.32 (H) 0.00 - 0.50 ug/mL-FEU    Comment: (NOTE) At the manufacturer cut-off value of 0.5 g/mL FEU, this assay has a negative predictive value of 95-100%.This assay is intended for use in conjunction with a clinical pretest probability (PTP) assessment model to exclude pulmonary embolism (PE) and deep venous thrombosis (DVT) in outpatients suspected of PE or DVT. Results should be correlated with clinical presentation. Performed at [facility], [place] [address] [place],  [address]   MRSA Next Gen by PCR, Nasal     Status:  Abnormal   Collection Time: 03/26/21  4:03  AM   Specimen: Nasal Mucosa; Nasal Swab  Result Value Ref Range   MRSA by PCR Next Gen DETECTED (A) NOT DETECTED    Comment: RESULT CALLED TO, READ BACK BY AND VERIFIED WITH: HAMBLIN, H. RN ON 03/26/2021 @ 0735 BY MECIAL J. (NOTE) The GeneXpert MRSA Assay (FDA approved for NASAL specimens only), is one component of a comprehensive MRSA colonization surveillance program. It is not intended to diagnose MRSA infection nor to guide or monitor treatment for MRSA infections. Test performance is not FDA approved in patients less than 69 years old. Performed at [facility], [place] [address], [place], [place] [address]    CT HEAD WO CONTRAST (5MM)  Result Date: 04/01/2021 CLINICAL DATA:  Altered mental status. EXAM: CT HEAD WITHOUT CONTRAST TECHNIQUE: Contiguous axial images were obtained from the base of the skull through the vertex without intravenous contrast. RADIATION DOSE REDUCTION: This exam was performed according to the departmental dose-optimization program which includes automated exposure control, adjustment of the mA and/or kV according to patient size and/or use of iterative reconstruction technique. COMPARISON:  02/25/2021 FINDINGS: Brain: Stable moderately enlarged ventricles and subarachnoid spaces. Stable moderate patchy white matter low density in both cerebral hemispheres. No intracranial hemorrhage, mass lesion or CT evidence of acute infarction. Vascular: No hyperdense vessel or unexpected calcification. Skull: Normal. Negative for fracture or focal lesion. Sinuses/Orbits: Status post bilateral cataract extraction. Unremarkable bones and included paranasal sinuses. Other: None. IMPRESSION: 1. No acute abnormality. 2. Stable atrophy and chronic small vessel white matter ischemic changes. Electronically Signed   By: Claudie Revering M.D.   On: 04/12/2021 19:01   CT CHEST W CONTRAST  Result Date: 04/07/2021 CLINICAL DATA:   Pneumonia, complication suspected, xray done Multifocal PNA: COVID+, trying to differentiate how much of this is COVID and how much is aspiration, and if theres anything else going on. EXAM: CT CHEST WITH CONTRAST TECHNIQUE: Multidetector CT imaging of the chest was performed during intravenous contrast administration. RADIATION DOSE REDUCTION: This exam was performed according to the departmental dose-optimization program which includes automated exposure control, adjustment of the mA and/or kV according to patient size and/or use of iterative reconstruction technique. CONTRAST:  24m OMNIPAQUE IOHEXOL 300 MG/ML  SOLN COMPARISON:  Chest x-ray 02/06/2021, PET CT 01/18/2017 FINDINGS: Cardiovascular: Prominent sized heart. No significant pericardial effusion. The thoracic aorta is normal in caliber. No atherosclerotic plaque of the thoracic aorta. No coronary artery calcifications. The pulmonary artery is normal in caliber. No central or proximal segmental pulmonary embolus. Mediastinum/Nodes: No enlarged mediastinal, hilar, or axillary lymph nodes. Thyroid gland, trachea, and esophagus demonstrate no significant findings. Lungs/Pleura: Interval development of nodular-like densities within the right upper and lower lobes with as an example a subsolid lesion measuring 1.8 cm in the right lower lobe (7:63). Multiple new subcentimeter pulmonary nodules are noted within the right upper lobe with as an example a 0.9 cm right upper lobe ground-glass nodule (7:78) and a 7 x 5 mm solid right upper lobe pulmonary nodule (7:77). Query cluster of nodules within the right lower lobe (7: 93-102). Slightly more conspicuous left lower lobe 8 x 8 mm subpleural nodule (7:84). Slightly more conspicuous 5 mm left upper lobe nodule (7:694). Redemonstration of bilateral pleural calcified plaques. Bilateral trace pleural effusions, right greater than left. Interval development of thickening along the right major fissure. No pneumothorax.  Upper Abdomen: No acute abnormality. Musculoskeletal: No abdominal wall hernia or abnormality. No suspicious lytic or blastic osseous lesions. No acute displaced fracture.  Bilateral degenerative changes of the spine. IMPRESSION: 1. Interval development of right upper and lower lobe pulmonary nodules with a subsolid 1.8 cm right lower lobe nodule. Non-contrast chest CT at 3-6 months is recommended. If nodules persist, subsequent management will be based upon the most suspicious nodule(s). This recommendation follows the consensus statement: Guidelines for Management of Incidental Pulmonary Nodules Detected on CT Images: From the Fleischner Society 2017; Radiology 2017; 284:228-243. 2. Likely stable but slightly more conspicuous left lower lobe and left upper lobe pulmonary nodules measuring up to 8 mm. 3. Interval development of thickening along the right major fissure. Recommend attention on follow-up. 4. Bilateral trace pleural effusions, right greater than left. 5. Bilateral calcified plaques consistent with asbestos exposure. 6.  Aortic Atherosclerosis (ICD10-I70.0). Electronically Signed   By: Iven Finn M.D.   On: 04/01/2021 22:28   CT PELVIS WO CONTRAST  Result Date: 04/21/2021 CLINICAL DATA:  Stage IV sacral decubitus ulcer. Clinical concern for osteomyelitis. EXAM: CT PELVIS WITHOUT CONTRAST TECHNIQUE: Multidetector CT imaging of the pelvis was performed following the standard protocol without intravenous contrast. RADIATION DOSE REDUCTION: This exam was performed according to the departmental dose-optimization program which includes automated exposure control, adjustment of the mA and/or kV according to patient size and/or use of iterative reconstruction technique. COMPARISON:  02/06/2021 FINDINGS: Urinary Tract: Portions of the urinary bladder and distal ureters are obscured by streak artifact from hip prostheses. No visible abnormalities. Bowel: Portions obscured by the streak artifacts from the  prostheses. Large amount of stool in the rectum with mild diffuse low density inferior rectal wall thickening. There is also a moderate amount of stool elsewhere in the included portions of the colon. The visualized small bowel is unremarkable. Grossly normal appendix. Vascular/Lymphatic: Atheromatous arterial calcifications without aneurysm. No enlarged lymph nodes. Reproductive:  Prostate obscured by the streak artifacts. Other: Anterior abdominal wall hernia repair mesh at the level of the inferior pelvis. Diffuse subcutaneous edema. Musculoskeletal: Sacral decubitus ulcer centered to the left of midline and extending close to the inferior aspect of the coccyx. No bone destruction or periosteal reaction. No soft tissue gas. Bilateral hip prostheses with associated streak artifacts. Lower lumbar spine degenerative changes. IMPRESSION: 1. Sacral decubitus ulcer without evidence of underlying osteomyelitis. 2. Rectal fecal impaction with associated mild diffuse low density wall thickening involving the distal rectum, compatible with stercoral proctitis. Electronically Signed   By: Claudie Revering M.D.   On: 04/13/2021 18:58   DG Chest Port 1 View  Result Date: 04/21/2021 CLINICAL DATA:  Possible sepsis.  Fevers. EXAM: PORTABLE CHEST 1 VIEW COMPARISON:  02/06/2021 PET-CT dated 01/18/2017. FINDINGS: Stable enlarged cardiac silhouette. The aorta remains mildly tortuous and calcified. Mild increase in patchy density in both lungs with more pronounced patchy density in the right lower lung zone. Bilateral calcified pleural plaques are again demonstrated. Old distal right clavicle fracture. Mild to moderate left glenohumeral joint degenerative changes. IMPRESSION: 1. Mild-to-moderate bilateral multilobar pneumonia. 2. Stable cardiomegaly. 3. Stable bilateral calcified pleural plaques compatible with previous asbestos exposure. Electronically Signed   By: Claudie Revering M.D.   On: 04/11/2021 19:10    Pending  Labs Unresulted Labs (From admission, onward)     Start     Ordered   03/26/21 0500  CBC with Differential/Platelet  Daily,   R      04/07/2021 2126   03/26/21 0500  Comprehensive metabolic panel  Daily,   R      04/02/2021 2126   03/26/21 0500  C-reactive  protein  Daily,   R      03/27/2021 2126   03/26/21 0500  D-dimer, quantitative  Daily,   R      03/31/2021 2126   04/10/2021 1749  Urine Culture  (Septic presentation on arrival (screening labs, nursing and treatment orders for obvious sepsis))  ONCE - STAT,   STAT       Question:  Indication  Answer:  Dysuria   03/30/2021 1749            Vitals/Pain Today's Vitals   03/26/21 1325 03/26/21 1400 03/26/21 1700 03/26/21 1900  BP: 140/61 118/67 116/70 124/62  Pulse: 68 (!) 31 71 70  Resp: _0 Temp:      TempSrc:      SpO2: 100% 100% 100% 100%  Weight:      PainSc:        Isolation Precautions Airborne and Contact precautions  Medications Medications  lactated ringers infusion (0 mLs Intravenous Stopped 03/26/21 1601)  sodium phosphate (FLEET) 7-19 GM/118ML enema 1 enema (1 enema Rectal Not Given 03/28/2021 2254)  HYDROcodone-acetaminophen (NORCO/VICODIN) 5-325 MG per tablet 1 tablet (has no administration in time range)  timolol (TIMOPTIC) 0.5 % ophthalmic solution 1 drop (1 drop Both Eyes Not Given 04/05/2021 2254)  acetaminophen (TYLENOL) tablet 650 mg (has no administration in time range)  bisacodyl (DULCOLAX) suppository 10 mg (has no administration in time range)  ondansetron (ZOFRAN) tablet 4 mg (has no administration in time range)    Or  ondansetron (ZOFRAN) injection 4 mg (has no administration in time range)  Ampicillin-Sulbactam (UNASYN) 3 g in sodium chloride 0.9 % 100 mL IVPB (3 g Intravenous New Bag/Given 03/26/21 1843)  remdesivir 100 mg in sodium chloride 0.9 % 100 mL IVPB (100 mg Intravenous New Bag/Given 03/26/21 1052)  enoxaparin (LOVENOX) injection 75 mg (75 mg Subcutaneous Given 03/26/21 1054)  Gerhardt's butt  cream ( Topical Given 03/26/21 1202)  methylPREDNISolone sodium succinate (SOLU-MEDROL) 40 mg/mL injection 40 mg (40 mg Intravenous Given 03/26/21 1054)  carbidopa-levodopa (SINEMET IR) 25-100 MG per tablet immediate release 1 tablet (1 tablet Oral Not Given 03/26/21 1842)  lisinopril (ZESTRIL) tablet 10 mg (10 mg Oral Given 03/26/21 1558)  polyethylene glycol (MIRALAX / GLYCOLAX) packet 17 g (17 g Oral Not Given 03/26/21 1600)  senna-docusate (Senokot-S) tablet 2 tablet (2 tablets Oral Not Given 03/26/21 1600)  lactulose (CHRONULAC) 10 GM/15ML solution 10 g (10 g Oral Not Given 03/26/21 1611)  sodium chloride 0.9 % bolus 1,000 mL (0 mLs Intravenous Stopped 04/17/2021 1953)  ceFEPIme (MAXIPIME) 2 g in sodium chloride 0.9 % 100 mL IVPB (0 g Intravenous Stopped 04/17/2021 1852)  vancomycin (VANCOREADY) IVPB 1500 mg/300 mL (0 mg Intravenous Stopped 03/27/2021 2114)  remdesivir 200 mg in sodium chloride 0.9% 250 mL IVPB (0 mg Intravenous Stopped 03/31/2021 2336)  iohexol (OMNIPAQUE) 300 MG/ML solution 75 mL (75 mLs Intravenous Contrast Given 04/06/2021 2153)    Mobility  High fall risk   Focused Assessments     R Recommendations: See Admitting Provider Note  Report given to:   Additional Notes:

## 2021-03-26 NOTE — Evaluation (Signed)
Clinical/Bedside Swallow Evaluation Patient Details  Name: Chad Russell MRN: [id number] Date of Birth: Jul 29, 1926  Today's Date: 03/26/2021 Time: SLP Start Time (ACUTE ONLY): 31 SLP Stop Time (ACUTE ONLY): 1630 SLP Time Calculation (min) (ACUTE ONLY): 20 min  Past Medical History:  Past Medical History:  Diagnosis Date   Arthritis    Atrial fibrillation ([place])    Balance problem 10/31/2013   BPH (benign prostatic hyperplasia) 2014   Urology Dr [name] with [facility] [phone]    Bradycardia    a. baseline HR in the 30's. Asymptomatic - no history of PPM placement.    CAD (coronary artery disease)    LHC 03/16/11: Mid LAD 10-20%, proximal circumflex 10%, mid RCA 20%, EF 55%.   Cataract    Complete heart block (HCC)    Fracture of femoral neck, right ([place]) 04/19/2017   GERD (gastroesophageal reflux disease)    takes occasional  zantac   Hx of echocardiogram    Echo 02/2011: EF 65-70%, normal wall motion, MAC, mild MR, mild LAE, mild RVE   Hypertension    OSA (obstructive sleep apnea)    per patient went to have sleep study done about 2 years ago, bu they never F/U with him about results; but wife reports he still has periods of apnea at night    Parkinson disease ([place])    Talar fracture    casted no surg   UTI (urinary tract infection) 10/2020   Past Surgical History:  Past Surgical History:  Procedure Laterality Date   ABDOMINAL AORTOGRAM W/LOWER EXTREMITY N/A 01/17/2017   Procedure: ABDOMINAL AORTOGRAM W/LOWER EXTREMITY;  Surgeon: Angelia Mould, MD;  Location: [facility];  Service: Cardiovascular;  Laterality: N/A;   COLONOSCOPY     Repeated and normal in 2011   FRACTURE SURGERY     HEMIARTHROPLASTY HIP Right 04/19/2017   HEMORROIDECTOMY     HIP ARTHROPLASTY Right 04/19/2017   Procedure: ARTHROPLASTY BIPOLAR HIP (HEMIARTHROPLASTY);  Surgeon: Marchia Bond, MD;  Location: [facility];  Service: Orthopedics;  Laterality: Right;   HIP ARTHROPLASTY Left 12/16/2018    Procedure: ARTHROPLASTY BIPOLAR HIP (HEMIARTHROPLASTY);  Surgeon: Altamese San Antonio, MD;  Location: [facility];  Service: Orthopedics;  Laterality: Left;   INGUINAL HERNIA REPAIR Right 02/26/2014   Procedure: LAPAROSCOPIC RIGHT INGUINAL HERNIA REPAIR;  Surgeon: Ralene Ok, MD;  Location: [facility];  Service: General;  Laterality: Right;   INGUINAL HERNIA REPAIR     INSERTION OF MESH Right 02/26/2014   Procedure: INSERTION OF MESH;  Surgeon: Ralene Ok, MD;  Location: [facility];  Service: General;  Laterality: Right;   LEFT HEART CATHETERIZATION WITH CORONARY ANGIOGRAM N/A 03/16/2011   Procedure: LEFT HEART CATHETERIZATION WITH CORONARY ANGIOGRAM;  Surgeon: Peter M Martinique, MD;  Location[facility];  Service: Cardiovascular;  Laterality: N/A;   PROSTATE BIOPSY     negative - [facility]   TRANSURETHRAL RESECTION OF PROSTATE N/A 09/07/2016   Procedure: TRANSURETHRAL RESECTION OF THE PROSTATE (TURP);  Surgeon: Irine Seal, MD;  Location: [facility];  Service: Urology;  Laterality: N/A;   HPI:  Patient is a 88 y.o. male with PMH: PAF, BPH, CHB, HTN, Parkinson's disease with dementia, OSA, arthritis who presented to the ER with worsening mentation. He was recently admitted (December 2022) for PNA due to presumed aspiration and had been residing at [facility] since 12/23 discharge.  During current admission he was found to be Covid positive. He also has a stage 4 sacral pressure ulcer. CXR  revealed mild-moderate bilateral multilobar PNA.    Assessment / Plan / Recommendation  Clinical Impression  Patient presents with clinical s/s of dysphagia as per this bedside swallow evaluation. He was lethargic and did not open eyes but he did respond to some questions and did follow basic commands to open mouth, attempt to stick out tongue. SLP performed oral care and removed small amount of dried and sticky secretions throughout oral cavity. Patient then sucked small amount of water from toothette sponge. No  overt s/s aspiration or penetration however PO amount was very small. He did exhibit some congested coughing with mild amount of secretions expectorated. (SLP cleared out of oral cavity). SLP spoke with his wife regarding recommendations and plan for ST services to follow up next date for potential PO trials. SLP is recommending continue NPO status except for necessary medications crushed in puree. SLP Visit Diagnosis: Dysphagia, unspecified (R13.10)    Aspiration Risk  Severe aspiration risk;Risk for inadequate nutrition/hydration    Diet Recommendation NPO   Medication Administration: Other (Comment) (necessary meds only crushed in puree) Postural Changes: Seated upright at 90 degrees    Other  Recommendations Oral Care Recommendations: Oral care QID;Staff/trained caregiver to provide oral care    Recommendations for follow up therapy are one component of a multi-disciplinary discharge planning process, led by the attending physician.  Recommendations may be updated based on patient status, additional functional criteria and insurance authorization.  Follow up Recommendations Skilled nursing-short term rehab (<3 hours/day)      Assistance Recommended at Discharge Frequent or constant Supervision/Assistance  Functional Status Assessment Patient has had a recent decline in their functional status and demonstrates the ability to make significant improvements in function in a reasonable and predictable amount of time.  Frequency and Duration min 2x/week  1 week       Prognosis Prognosis for Safe Diet Advancement: Fair Barriers to Reach Goals: Cognitive deficits;Time post onset      Swallow Study   General Date of Onset: 04/21/2021 HPI: Patient is a 88 y.o. male with PMH: PAF, BPH, CHB, HTN, Parkinson's disease with dementia, OSA, arthritis who presented to the ER with worsening mentation. He was recently admitted (December 2022) for PNA due to presumed aspiration and had been residing at  [facility] since 12/23 discharge.  During current admission he was found to be Covid positive. He also has a stage 4 sacral pressure ulcer. CXR revealed mild-moderate bilateral multilobar PNA. Type of Study: Bedside Swallow Evaluation Previous Swallow Assessment: BSE during previous admission Diet Prior to this Study: NPO Temperature Spikes Noted: No Respiratory Status: Room air History of Recent Intubation: No Behavior/Cognition: Requires cueing;Lethargic/Drowsy Oral Cavity Assessment: Dried secretions Oral Care Completed by SLP: Yes Oral Cavity - Dentition: Adequate natural dentition Self-Feeding Abilities: Total assist Patient Positioning: Partially reclined Baseline Vocal Quality: Low vocal intensity Volitional Cough: Congested Volitional Swallow: Unable to elicit    Oral/Motor/Sensory Function Overall Oral Motor/Sensory Function: Generalized oral weakness (difficult to fully assess as patient lethargic and unable to follow all commands. Appears with decreased oral motor strength and ROM overall.)   Ice Chips     Thin Liquid Thin Liquid: Impaired Presentation:  (via toothette sponge) Oral Phase Functional Implications: Prolonged oral transit Pharyngeal  Phase Impairments: Other (comments) Other Comments: Patient sucked small amounts of water from toothette sponge. He did exhibit congested coughing however appeared to be productive and small amount of expectoration of secretions into oral cavity that SLP cleared with toothette sponge and  toothbrush    Nectar Thick     Honey Thick     Puree Puree: Not tested   Solid     Solid: Not tested      Sonia Baller, MA, CCC-SLP Speech Therapy

## 2021-03-26 NOTE — Assessment & Plan Note (Deleted)
Multiple lung nodules noted on CT chest (RUL, RLL, LUL, LLL). Also has R>L pleural effusion.  Differential for effusion would include parapneumonic versus malignant. Given his extremely frail state, poor functional status, underlying dementia he would be a extremely poor treatment candidate. Per radiology fluid collection in right pleura not enough for thoracentesis at this time.

## 2021-03-26 NOTE — Progress Notes (Signed)
.  He Progress Note   Patient: Chad Russell TIW:[id number] DOB: 08/11/1926 DOA: 03/28/2021     1 DOS: the patient was seen and examined on 03/26/2021   Brief hospital course: Mr. [name] is a 88  yo male with PMH PAF, BPH, CAD, CHB, HTN, Parkinson's with dementia, OSA, arthritis who presented to the ER with worsening mentation.  He was recently admitted in December 2022 for PNA (negative covid at that time) due to presumed aspiration.  He has been less mobile since October 2022 with significant progressive decline from a mobility standpoint.  He has been residing at [facility] since discharge on 02/13/21.  He has been bedbound for several weeks since that time as well.  On workup he was reported to have increased confusion compared to baseline and associated fever.  He was found to be covid positive on workup and was started on Remdesivir. He also has a stage 4 sacral pressure ulcer that has had difficulty healing.   Assessment and Plan: * Multifocal pneumonia- (present on admission) - Differential includes ongoing aspiration from underlying advanced Parkinson's disease/dementia versus contribution from COVID-pneumonia -Reasonable to continue Unasyn and remdesivir for now -Trend procalcitonin -Follow-up cultures  COVID-19 virus infection- (present on admission) - continue remdesivir - trend inflammatory markers - CT value 33  Acute respiratory failure with hypoxia ([place])- (present on admission) - New O2 requirement in setting of PNA + Fever + COVID positive. - continue steroids - wean O2 as able   Sacral decubitus ulcer- (present on admission) - No cellulitis or osteomyelitis on CT - follow rec's from wound care consult - low suspicion this is going to heal well with his bed bound state and poor functional status/physical reserve; I've communicated this with his wife, but she is disproportionately hopeful he will have a tremendous recovery in general  Acute metabolic  encephalopathy- (present on admission) Delirium due to PNA / COVID on top of chronic parkinson's dementia.  Lung nodule - Multiple lung nodules noted on CT chest (RUL, RLL, LUL, LLL). Also has R>L pleural effusion.  Differential for effusion would include parapneumonic versus malignant -Regardless, given his extremely frail state, poor functional status, underlying dementia he would be a extremely poor treatment candidate -I will communicate these findings with wife during next update although these are not likely to be pursued for further work-up nor the effusions unless start to contribute to his respiratory status (however a possible malignant diagnosis might guide more GOC discussions)  Constipation- (present on admission) - s/p enema in ER - follow up response as may need disimpaction and/or more laxatives  Parkinson disease ([place])- (present on admission) - Advanced PD with dementia, although wife states he is more lucid that chart review implies - significant cerebral atrophy appreciated on CT head as well - continue sinemet; wife states he "locks up" often and is stiff. His sinemet is BID dosing, therefore I will adjust to QID and modify dose - given his frail state in general, I again tried discussing code status at length on the phone with wife (20 min call) regarding that he would likely not survive a resuscitation event, however she is still insistent on full code for now  Atrial fibrillation ([place])- (present on admission) Holding Xarelto while NPO. Lovenox bridge while waiting for SLP eval.  HTN (hypertension)- (present on admission) - mild elevation on admission - use labetalol or hydralazine for now as well  - resume home lisinopril       Subjective: Resting in  bed when seen in the ER this morning.  Eyes closed but did open them to voice was able to answer my questions fairly well, but underlying dementia still appreciated. Called and spoke with his wife this afternoon for  an extensive amount of time.  We tried talking about CODE STATUS as well, however she was still insistent on full code at this time. I also discussed his underlying severe sacral decubitus ulcer with concerns for poor healing.  Physical Exam: Vitals:   03/26/21 0715 03/26/21 0730 03/26/21 0800 03/26/21 0900  BP: (!) 143/52 (!) 119/49 (!) 124/39 (!) 151/61  Pulse: (!) 28 (!) 28 71 (!) 29  Resp: 15 (!) 22 15 15   Temp:      TempSrc:      SpO2: 100% 99% 100% 100%  Weight:      Physical Exam Constitutional:      Comments: Chronically ill-appearing elderly gentleman lying in bed in no distress  HENT:     Head: Normocephalic and atraumatic.     Mouth/Throat:     Mouth: Mucous membranes are dry.  Eyes:     Extraocular Movements: Extraocular movements intact.  Cardiovascular:     Rate and Rhythm: Normal rate and regular rhythm.  Pulmonary:     Breath sounds: Rhonchi and rales present. No wheezing.  Abdominal:     General: Bowel sounds are normal. There is no distension.     Palpations: Abdomen is soft.     Tenderness: There is no abdominal tenderness.  Musculoskeletal:        General: No swelling.     Cervical back: Normal range of motion and neck supple.  Skin:    General: Skin is warm.  Neurological:     Comments: Chronic underlying dementia appreciated.  Follows commands and moves all 4 extremities albeit extremely weak   DVT ppx: Lovenox   Data Reviewed:  I have Reviewed nursing notes, Vitals, and Lab results since pt's last encounter. Pertinent lab results Hgb 9.9 g/dL I have independently visualized and interpreted imaging CT chest which showed multiple lung nodules, R>L effusion. I have reviewed the last note from all staff,  I have discussed pt's care plan and test results with wife and nursing staff.  Family Communication: Wife on phone on 2/2  Disposition: Status is: Inpatient Remains inpatient appropriate because: ongoing covid treatment, pna treatment, and  failure to thrive     Planned Discharge Destination: Skilled nursing facility       Author: Dwyane Dee, MD 03/26/2021 1:18 PM  For on call review [email or web address].

## 2021-03-26 NOTE — Hospital Course (Signed)
Chad Russell is a 88  yo male with PMH PAF, BPH, CAD, CHB, HTN, Parkinson's with dementia, OSA, arthritis who presented to the ER with worsening mentation.  He was recently admitted in December 2022 for PNA (negative covid at that time) due to presumed aspiration.  He has been less mobile since October 2022 with significant progressive decline from a mobility standpoint.  He has been residing at [facility] since discharge on 02/13/21.  He has been bedbound for several weeks since that time as well.  On workup he was reported to have increased confusion compared to baseline and associated fever.  He was found to be covid positive on workup and was started on Remdesivir. He also has a stage 4 sacral pressure ulcer that has had difficulty healing.

## 2021-03-26 NOTE — Assessment & Plan Note (Addendum)
Blood pressure remains soft.

## 2021-03-26 NOTE — Progress Notes (Signed)
Pt is unable to do flutter valve at this time.

## 2021-03-26 NOTE — Consult Note (Signed)
[facility] Nurse Consult Note: Reason for Consult:Stage 4 pressure injury to sacrum. Patient known to our team and this Probation officer from a previous admission last month. Medical diagnoses include Frailty, Parkinson's Disease, Protein Calorie Malnutrition, incontinence. Patient is currently Covid + and with pneumonia. Consult performed remotely following review of the EMR including photographs. Wound type:Pressure Pressure Injury POA: Yes Measurement:To be obtained by Bedside RN today and documented on the nursing flow sheet Wound HMC:NOBSJ, red, moist Drainage (amount, consistency, odor) Moderate on old dressings Periwound: erythematous Dressing procedure/placement/frequency: I will provide the patient with a mattress replacement with low air loss feature for mitigation of microclimate and pressure redistribution. Bilateral Prevalon pressure redistribution heel boots are also provided.  Topical care to the sacral wound with surrounding erythema will be with twice daily cleansing, followed by a thin application of Gerhart's Butt Cream to the periwound tissue. This is a compounded preparation of 1:1:1 Hydrocortisone cream,lotrimin cream, and zinc oxide.  The wound defect will be filled with a silver hydrofiber (Aquacel Ag+ Advantage) and the area topped with an ABD pad and secured with tape. Patient is to be turned side to side and the pressing changed twice daily and PRN incontinence. A PurWick external device for males is recommended for management of urinary incontinence. The previously noted bilateral LE wounds will be dressed daily with xeroform gauze and topped with dry gauze and secured with Kerlix roll gauze/paper tape.   [facility] nursing team will not follow, but will remain available to this patient, the nursing and medical teams.  Please re-consult if needed. Thanks, Maudie Flakes, MSN, RN, [place], Arther Abbott  Pager# [phone]

## 2021-03-27 ENCOUNTER — Telehealth: Payer: Self-pay | Admitting: Family Medicine

## 2021-03-27 ENCOUNTER — Encounter (HOSPITAL_COMMUNITY): Payer: Self-pay | Admitting: Internal Medicine

## 2021-03-27 DIAGNOSIS — R1310 Dysphagia, unspecified: Secondary | ICD-10-CM

## 2021-03-27 DIAGNOSIS — E43 Unspecified severe protein-calorie malnutrition: Secondary | ICD-10-CM | POA: Diagnosis present

## 2021-03-27 DIAGNOSIS — J189 Pneumonia, unspecified organism: Secondary | ICD-10-CM | POA: Diagnosis not present

## 2021-03-27 DIAGNOSIS — U071 COVID-19: Secondary | ICD-10-CM | POA: Diagnosis not present

## 2021-03-27 DIAGNOSIS — J9601 Acute respiratory failure with hypoxia: Secondary | ICD-10-CM | POA: Diagnosis not present

## 2021-03-27 LAB — CBC WITH DIFFERENTIAL/PLATELET
Abs Immature Granulocytes: 0.14 10*3/uL — ABNORMAL HIGH (ref 0.00–0.07)
Basophils Absolute: 0 10*3/uL (ref 0.0–0.1)
Basophils Relative: 0 %
Eosinophils Absolute: 0 10*3/uL (ref 0.0–0.5)
Eosinophils Relative: 0 %
HCT: 36.5 % — ABNORMAL LOW (ref 39.0–52.0)
Hemoglobin: 11.7 g/dL — ABNORMAL LOW (ref 13.0–17.0)
Immature Granulocytes: 1 %
Lymphocytes Relative: 9 %
Lymphs Abs: 1.2 10*3/uL (ref 0.7–4.0)
MCH: 29.3 pg (ref 26.0–34.0)
MCHC: 32.1 g/dL (ref 30.0–36.0)
MCV: 91.5 fL (ref 80.0–100.0)
Monocytes Absolute: 0.7 10*3/uL (ref 0.1–1.0)
Monocytes Relative: 5 %
Neutro Abs: 11.5 10*3/uL — ABNORMAL HIGH (ref 1.7–7.7)
Neutrophils Relative %: 85 %
Platelets: 322 10*3/uL (ref 150–400)
RBC: 3.99 MIL/uL — ABNORMAL LOW (ref 4.22–5.81)
RDW: 17.7 % — ABNORMAL HIGH (ref 11.5–15.5)
WBC: 13.6 10*3/uL — ABNORMAL HIGH (ref 4.0–10.5)
nRBC: 0 % (ref 0.0–0.2)

## 2021-03-27 LAB — COMPREHENSIVE METABOLIC PANEL
ALT: 10 U/L (ref 0–44)
AST: 21 U/L (ref 15–41)
Albumin: 2.6 g/dL — ABNORMAL LOW (ref 3.5–5.0)
Alkaline Phosphatase: 75 U/L (ref 38–126)
Anion gap: 7 (ref 5–15)
BUN: 30 mg/dL — ABNORMAL HIGH (ref 8–23)
CO2: 23 mmol/L (ref 22–32)
Calcium: 8.6 mg/dL — ABNORMAL LOW (ref 8.9–10.3)
Chloride: 104 mmol/L (ref 98–111)
Creatinine, Ser: 0.77 mg/dL (ref 0.61–1.24)
GFR, Estimated: >60 mL/min (ref >60)
Glucose, Bld: 144 mg/dL — ABNORMAL HIGH (ref 70–99)
Potassium: 4.2 mmol/L (ref 3.5–5.1)
Sodium: 134 mmol/L — ABNORMAL LOW (ref 135–145)
Total Bilirubin: 0.8 mg/dL (ref 0.3–1.2)
Total Protein: 6.2 g/dL — ABNORMAL LOW (ref 6.5–8.1)

## 2021-03-27 LAB — VITAMIN B12: Vitamin B-12: 794 pg/mL (ref 180–914)

## 2021-03-27 LAB — GLUCOSE, CAPILLARY: Glucose-Capillary: 111 mg/dL — ABNORMAL HIGH (ref 70–99)

## 2021-03-27 LAB — MAGNESIUM: Magnesium: 2.1 mg/dL (ref 1.7–2.4)

## 2021-03-27 LAB — D-DIMER, QUANTITATIVE: D-Dimer, Quant: 1.03 ug/mL-FEU — ABNORMAL HIGH (ref 0.00–0.50)

## 2021-03-27 LAB — PHOSPHORUS: Phosphorus: 3.4 mg/dL (ref 2.5–4.6)

## 2021-03-27 LAB — URINE CULTURE: Culture: NO GROWTH

## 2021-03-27 LAB — C-REACTIVE PROTEIN: CRP: 10.2 mg/dL — ABNORMAL HIGH (ref <1.0)

## 2021-03-27 LAB — FOLATE: Folate: 16.9 ng/mL (ref >5.9)

## 2021-03-27 MED ORDER — CEFEPIME HCL 2 G IJ SOLR
2.0000 g | Freq: Three times a day (TID) | INTRAMUSCULAR | Status: AC
  Administered 2021-03-27 – 2021-04-02 (×18): 2 g via INTRAVENOUS
  Filled 2021-03-27 (×20): qty 2

## 2021-03-27 MED ORDER — PROSOURCE TF PO LIQD
45.0000 mL | Freq: Two times a day (BID) | ORAL | Status: DC
  Administered 2021-03-28 – 2021-04-05 (×16): 45 mL
  Filled 2021-03-27 (×22): qty 45

## 2021-03-27 MED ORDER — ATROPINE SULFATE 1 MG/10ML IJ SOSY
0.5000 mg | PREFILLED_SYRINGE | INTRAMUSCULAR | Status: DC | PRN
  Filled 2021-03-27: qty 10

## 2021-03-27 MED ORDER — VITAL HIGH PROTEIN PO LIQD
1000.0000 mL | ORAL | Status: DC
  Filled 2021-03-27: qty 1000

## 2021-03-27 MED ORDER — OSMOLITE 1.5 CAL PO LIQD
1000.0000 mL | ORAL | Status: DC
  Administered 2021-03-28 – 2021-03-30 (×2): 1000 mL
  Filled 2021-03-27 (×6): qty 1000

## 2021-03-27 MED ORDER — VANCOMYCIN HCL 1500 MG/300ML IV SOLN
1500.0000 mg | INTRAVENOUS | Status: AC
  Administered 2021-03-27 – 2021-04-02 (×6): 1500 mg via INTRAVENOUS
  Filled 2021-03-27 (×7): qty 300

## 2021-03-27 MED ORDER — MUPIROCIN 2 % EX OINT
1.0000 "application " | TOPICAL_OINTMENT | Freq: Two times a day (BID) | CUTANEOUS | Status: AC
  Administered 2021-03-27 – 2021-03-31 (×10): 1 via NASAL
  Filled 2021-03-27 (×2): qty 22

## 2021-03-27 MED ORDER — CHLORHEXIDINE GLUCONATE CLOTH 2 % EX PADS
6.0000 | MEDICATED_PAD | Freq: Every day | CUTANEOUS | Status: AC
  Administered 2021-03-27 – 2021-03-31 (×5): 6 via TOPICAL

## 2021-03-27 MED ORDER — FREE WATER
150.0000 mL | Freq: Four times a day (QID) | Status: DC
  Administered 2021-03-28 – 2021-04-06 (×32): 150 mL

## 2021-03-27 MED ORDER — JUVEN PO PACK
1.0000 | PACK | Freq: Two times a day (BID) | ORAL | Status: DC
  Administered 2021-03-28 – 2021-04-05 (×16): 1 via ORAL
  Filled 2021-03-27 (×22): qty 1

## 2021-03-27 MED ORDER — METRONIDAZOLE 500 MG/100ML IV SOLN
500.0000 mg | Freq: Two times a day (BID) | INTRAVENOUS | Status: AC
  Administered 2021-03-27 – 2021-04-02 (×11): 500 mg via INTRAVENOUS
  Filled 2021-03-27 (×11): qty 100

## 2021-03-27 NOTE — Progress Notes (Incomplete)
RApid response unsuccessful in placement of

## 2021-03-27 NOTE — Progress Notes (Signed)
Attempted to place NG tube 3x. Unable to successfully place at this time. Girguis MD aware

## 2021-03-27 NOTE — Progress Notes (Signed)
Pt arrived to unit 03/26/21 - sacral wound. No wounds seen on BL LE. Pt arrived with full urinated brief. Pt placed on 2L oxygen [place].

## 2021-03-27 NOTE — Assessment & Plan Note (Addendum)
°  Care transitioned to comfort care.

## 2021-03-27 NOTE — Progress Notes (Signed)
Pharmacy Antibiotic Note  Chad Russell is a 88 y.o. male admitted on 03/29/2021 with +COVID, possible aspiration pneumonia.  Pharmacy has been consulted for vancomycin dosing.  Plan: Vancomycin 1500mg  IV q24h for estimated AUC 470 using SCr 0.8 Check vancomycin levels as needed, goal AUC 400-550 Cefepime per MD Follow up renal function & cultures   Weight: 76.2 kg (168 lb)  Temp (24hrs), Avg:98 F (36.7 C), Min:97.6 F (36.4 C), Max:98.2 F (36.8 C)  Recent Labs  Lab 04/21/2021 1749 03/26/2021 1823 03/26/21 0403 03/27/21 0347  WBC 9.7  --  9.6 13.6*  CREATININE 0.76 0.80 0.77 0.77  LATICACIDVEN 1.4  --   --   --     Estimated Creatinine Clearance: 63.5 mL/min (by C-G formula based on SCr of 0.77 mg/dL).    Allergies  Allergen Reactions   Doxycycline Other (See Comments)    Upset stomach   Keflex [Cephalexin] Other (See Comments)    Abdominal discomfort 04-19-17 Pt has tolerated orally; tolerated IV Rocephin    2/1 Vanc x1 2/1 Cefepime x1 2/1 Remdesivir >> (2/5) 2/1 Unasyn >> 2/3 2/3 Cefepime >> 2/3 Vanc >>  2/1 COVID positive; influenza neg 2/1 BCx: ngtd 2/1 UCx: NGF 2/2 MRSA PCR: positive  Thank you for allowing pharmacy to be a part of this patients care.  Peggyann Juba, PharmD, BCPS Pharmacy: [phone] 03/27/2021 1:48 PM

## 2021-03-27 NOTE — TOC Initial Note (Signed)
Transition of Care [facility]) - Initial/Assessment Note    Patient Details  Name: Chad Russell MRN: [id number] Date of Birth: [birth year]  Transition of Care [facility]) CM/SW Contact:    Leeroy Cha, RN Phone Number: 03/27/2021, 8:48 AM  Clinical Narrative:                  Transition of Care [facility]) Screening Note   Patient Details  Name: Chad Russell Date of Birth: 1926/03/17   Transition of Care [facility]) CM/SW Contact:    Leeroy Cha, RN Phone Number: 03/27/2021, 8:48 AM    Transition of Care Department [facility]) has reviewed patient and no TOC needs have been identified at this time. We will continue to monitor patient advancement through interdisciplinary progression rounds. If new patient transition needs arise, please place a TOC consult.    Expected Discharge Plan: Home/Self Care Barriers to Discharge: Continued Medical Work up   Patient Goals and CMS Choice Patient states their goals for this hospitalization and ongoing recovery are:: to go home CMS Medicare.gov Compare Post Acute Care list provided to:: Patient    Expected Discharge Plan and Services Expected Discharge Plan: Home/Self Care   Discharge Planning Services: CM Consult   Living arrangements for the past 2 months: Single Family Home                                      Prior Living Arrangements/Services Living arrangements for the past 2 months: Single Family Home Lives with:: Spouse Patient language and need for interpreter reviewed:: Yes Do you feel safe going back to the place where you live?: Yes            Criminal Activity/Legal Involvement Pertinent to Current Situation/Hospitalization: No - Comment as needed  Activities of Daily Living Home Assistive Devices/Equipment: Other (Comment) (pt unable to answer) ADL Screening (condition at time of admission) Patient's cognitive ability adequate to safely complete daily activities?: No Is the patient deaf or have difficulty  hearing?: Yes Does the patient have difficulty seeing, even when wearing glasses/contacts?: Yes Does the patient have difficulty concentrating, remembering, or making decisions?: Yes Patient able to express need for assistance with ADLs?: No Does the patient have difficulty dressing or bathing?: Yes Independently performs ADLs?: No Communication: Independent Dressing (OT): Needs assistance Is this a change from baseline?: Pre-admission baseline Grooming: Needs assistance Is this a change from baseline?: Pre-admission baseline Feeding: Needs assistance Is this a change from baseline?: Pre-admission baseline Bathing: Needs assistance Is this a change from baseline?: Pre-admission baseline Toileting: Needs assistance Is this a change from baseline?: Pre-admission baseline In/Out Bed: Needs assistance Is this a change from baseline?: Pre-admission baseline Walks in Home: Dependent Is this a change from baseline?: Pre-admission baseline Does the patient have difficulty walking or climbing stairs?: Yes Weakness of Legs: Both Weakness of Arms/Hands: Both  Permission Sought/Granted                  Emotional Assessment Appearance:: Appears stated age Attitude/Demeanor/Rapport: Engaged Affect (typically observed): Calm Orientation: : Oriented to Self, Oriented to Place, Oriented to Situation, Oriented to  Time Alcohol / Substance Use: Not Applicable Psych Involvement: No (comment)  Admission diagnosis:  Fecal impaction ([place]) [K56.41] Hypoxia [R09.02] Sacral decubitus ulcer, stage IV ([place]) [L89.154] Sepsis due to pneumonia ([place]) [J18.9, A41.9] COVID-19 [U07.1] Patient Active Problem List   Diagnosis Date Noted   Lung  nodule 03/26/2021   Constipation 04/17/2021   Sacral decubitus ulcer 04/14/2021   Acute respiratory failure with hypoxia ([place]) 04/08/2021   COVID-19 virus infection 04/20/2021   Pressure injury of skin 02/10/2021   Generalized weakness 89/16/9450   Acute  metabolic encephalopathy 38/88/2800   Dehydration 02/07/2021   Acute prerenal azotemia 02/07/2021   Parkinson disease ([place])    Protein calorie malnutrition ([place])    Multifocal pneumonia 02/06/2021   UTI (urinary tract infection) 10/14/2020   Bradycardia 10/14/2020   Educated about COVID-19 virus infection 11/29/2019   Exudative age-related macular degeneration of left eye with active choroidal neovascularization ([place]) 10/02/2019   Central retinal vein occlusion with macular edema of right eye 06/20/2019   Central retinal vein occlusion with neovascularization of right eye 06/20/2019   Ocular ischemic syndrome 06/20/2019   Retinal hemorrhage of left eye 06/20/2019   Retinal hemorrhage of right eye 06/20/2019   AKI (acute kidney injury) ([place]) 12/15/2018   Hematoma of scalp    Heart block    Chest pain 05/04/2018   Fracture of femoral neck, left ([place]) 04/19/2017   MGUS (monoclonal gammopathy of unknown significance) 01/15/2017   BPH with obstruction/lower urinary tract symptoms 09/07/2016   Preoperative cardiovascular examination 08/24/2016   Urinary retention 05/13/2016   Balance problem 10/31/2013   OSA (obstructive sleep apnea) 10/31/2013   BPH (benign prostatic hyperplasia) 08/28/2012   Atrial fibrillation ([place]) 06/08/2012   Hyperplasia of prostate with lower urinary tract symptoms (LUTS) 04/28/2012   Moderate malnutrition ([place]) 04/28/2012   Infection of urinary tract - recurrent 04/26/2012   NSTEMI (non-ST elevated myocardial infarction) ([place]) 03/15/2011   Complete heart block ([place]) 03/15/2011   HTN (hypertension) 03/15/2011   PCP:  Garwin Brothers, MD Pharmacy:   [facility] [id number], [place] [address] Phone: [phone] Fax: [phone]     Social Determinants of Health (SDOH) Interventions    Readmission Risk Interventions No flowsheet data found.

## 2021-03-27 NOTE — Progress Notes (Signed)
At 0305: Pt has been having asymptomatic bradycardia with HR as low as 26bpm. underlining rhythm: Afib. Pt is a full code. BP stable. Pt arousable, A/Ox2 (admitted with AMS)  On call APP [place],NP notified. 12lead EKG ordered. Rapid response and charge Rn notified.   Pt has no pacemaker/ICD. cardiac hx noted by all parties.  EKG completed, uploaded in [place]. Lab work drawn at time of EKG.   Critical rhythm resulted on EKG - On call APP Olena Heckle, NP notified. No new orders given. Pt remains asymptomatic.   Will continue to monitor and address pt needs throughout this shift PRN.

## 2021-03-27 NOTE — Progress Notes (Addendum)
.  He Progress Note   Patient: Chad Russell VOZ:[id number] DOB: 05/23/1926 DOA: 04/05/2021     2 DOS: the patient was seen and examined on 03/27/2021   Brief hospital course: [name] is a 88  yo male with PMH PAF, BPH, CAD, CHB, HTN, Parkinson's with dementia, OSA, arthritis who presented to the ER with worsening mentation.  He was recently admitted in December 2022 for PNA (negative covid at that time) due to presumed aspiration.  He has been less mobile since October 2022 with significant progressive decline from a mobility standpoint.  He has been residing at [facility] since discharge on 02/13/21.  He has been bedbound for several weeks since that time as well.  On workup he was reported to have increased confusion compared to baseline and associated fever.  He was found to be covid positive on workup and was started on Remdesivir. He also has a stage 4 sacral pressure ulcer that has had difficulty healing.   Assessment and Plan: * Multifocal pneumonia- (present on admission) - Differential includes ongoing aspiration from underlying advanced Parkinson's disease/dementia versus contribution from COVID-pneumonia - given MRSA positive swab, will change abx to vanc, cefepime, flagyl for now -Trend procalcitonin -Follow-up cultures  COVID-19 virus infection- (present on admission) - continue remdesivir - trend inflammatory markers - CT value 33  Acute respiratory failure with hypoxia ([place])- (present on admission) - New O2 requirement in setting of PNA + Fever + COVID positive. - continue steroids - wean O2 as able   Sacral decubitus ulcer- (present on admission) - No cellulitis or osteomyelitis on CT - follow rec's from wound care consult - low suspicion this is going to heal well with his bed bound state and poor functional status/physical reserve; I've communicated this with his wife, but she is disproportionately hopeful he will have a tremendous recovery in  general  Goals of care, counseling/discussion - Have discussed his case with his wife at length for the past 2 days. There is certainly a component of denial unfortunately but also just not being fully aware of the gravity of his disease state and his overall QOL and functional status. In regards to his desaturation this am down into the 70s while RN and myself were in the room, she immediately asked [place] if he had received his "blood circulation medicine, xarelto" - regardless I again went thru his medical history with her and his current presentation, his recurrent infections, poor QOL, severe sacral ulcer and tried to explain that he has continued to have multiple setbacks along his road of recovery, furthermore he's now failed a swallow assessment and nutrition would begin to play a role, even moreso he is full code and should he sustain a cardiac event he has extremely poor chance of surviving a resuscitative event much less would he come out with the same, albeit, poor quality of life he already has. But despite this in depth conversation the only decision they made was to place a cortrak and start TF - will await further discussions with palliative care at this point and continue ongoing treatment as we're doing  - prognosis remains guarded at best with easy potential for worsening   Lung nodule - Multiple lung nodules noted on CT chest (RUL, RLL, LUL, LLL). Also has R>L pleural effusion.  Differential for effusion would include parapneumonic versus malignant -Regardless, given his extremely frail state, poor functional status, underlying dementia he would be a extremely poor treatment candidate -I will communicate these  findings with wife during next update although these are not likely to be pursued for further work-up nor the effusions unless start to contribute to his respiratory status (however a possible malignant diagnosis might guide more GOC discussions)  Constipation- (present on  admission) - s/p enema in ER - follow up response as may need disimpaction and/or more laxatives  Parkinson disease ([place])- (present on admission) - Advanced PD with dementia, although wife states he is more lucid than chart review implies - significant cerebral atrophy appreciated on CT head as well - continue sinemet; wife states he "locks up" often and is stiff. His sinemet is BID dosing, therefore I will adjust to QID and modify dose - given his frail state in general, I again tried discussing code status at length with wife who's bedside this morning; she was still not receptive to the conversation but the patient was able to understand most of the conversation and he states they will talk about it more together - palliative care aware of consult as well  Dysphagia - Underwent SLP eval on 03/26/2021 with overt signs of dysphagia and risk for aspiration. - recommended to remain NPO - we'll continue necessary meds for now via crushed in puree, especially his sinemet (in efforts that this helps his overall function improve) - wife and patient have talked and they wish for cortrak placement and starting TF for now  Acute metabolic encephalopathy-resolved as of 03/27/2021, (present on admission) Delirium due to PNA / COVID on top of chronic parkinson's dementia.  Atrial fibrillation ([place])- (present on admission) Holding Xarelto while NPO. - continue lovenox   HTN (hypertension)- (present on admission) - mild elevation on admission - use labetalol or hydralazine for now as well  - resume home lisinopril       Subjective: Overnight had a episode of bradycardia down to the 20s to 30s.  Wife present this morning.  I again had a very long conversation explaining his poor quality life and progressive decline on top of his already poor functional status.  Unfortunately they were still not ready to make any decisions regarding changing his CODE STATUS.  He remains full code. We also talked about  nutrition given that he failed his swallow assessment yesterday.  Luckily he is still swallowing some pills okay for now and we will continue this however from a nutrition standpoint they have elected for feeding tube placement and beginning tube feeds.  Physical Exam: Vitals:   03/27/21 1120 03/27/21 1125 03/27/21 1346 03/27/21 1356  BP: 133/71  (!) 151/65   Pulse: 97  (!) 128 (!) 102  Resp: 16  19   Temp: 98.1 F (36.7 C)  98 F (36.7 C)   TempSrc: Axillary  Axillary   SpO2: (!) 84% 92%    Weight:      Physical Exam Constitutional:      Comments: Chronically ill-appearing elderly gentleman lying in bed in no distress  HENT:     Head: Normocephalic and atraumatic.     Mouth/Throat:     Mouth: Mucous membranes are dry.  Eyes:     Extraocular Movements: Extraocular movements intact.  Cardiovascular:     Rate and Rhythm: Normal rate and regular rhythm.  Pulmonary:     Breath sounds: Rhonchi and rales present. No wheezing.  Abdominal:     General: Bowel sounds are normal. There is no distension.     Palpations: Abdomen is soft.     Tenderness: There is no abdominal tenderness.  Musculoskeletal:  General: No swelling.     Cervical back: Normal range of motion and neck supple.  Skin:    General: Skin is warm.  Neurological:     Comments: Chronic underlying dementia appreciated.  Follows commands and moves all 4 extremities albeit extremely weak   DVT ppx: Lovenox   Data Reviewed:  I have Reviewed nursing notes, Vitals, and Lab results since pt's last encounter. Pertinent lab results Hgb 11.7 g/dL I have independently visualized and interpreted imaging CT chest which showed multiple lung nodules, R>L effusion. I have reviewed the last note from  staff over past 24 hours,  I have discussed pt's care plan and test results with wife and nursing staff.  Family Communication: Wife bedside  Disposition: Status is: Inpatient Remains inpatient appropriate because: ongoing  covid treatment, pna treatment, and failure to thrive     Planned Discharge Destination: Skilled nursing facility  DVT ppx: Lovenox       Author: Dwyane Dee, MD 03/27/2021 2:14 PM  For on call review [email or web address].

## 2021-03-27 NOTE — Plan of Care (Signed)
Discussed current plan of care with patients spouse.

## 2021-03-27 NOTE — Progress Notes (Signed)
SLP Cancellation Note  Patient Details Name: Chad Russell MRN: [id number] DOB: 1926/08/05   Cancelled treatment:       Reason Eval/Treat Not Completed: Medical issues which prohibited therapy (Given he is requiring HFNC, has h/o significant dysphagia with aspiration due to his Parkinson's, has pna and is a full code - would be best to hold for today.) SLP can follow up over the weekend to see if pt has improved. An MBS would be indicated when appropriate to provide definitive information to the wife and pt regarding his dysphagia.  His aspiration and malnutrition and diminished functional reserve negatively impacting tolerance will be ongoing.  Kathleen Lime, MS [facility] SLP [place] Office [phone] Cell [phone]    Macario Golds 03/27/2021, 5:05 PM

## 2021-03-27 NOTE — Telephone Encounter (Signed)
Pt's wife called in stating that pt is back in the hospital and she would like to talk to Dr .Carlota Raspberry about pt's care. I advised pt that I'm not sure if Dr. Carlota Raspberry would be able to call her back directly but I would ask. That is all the information that she would give me.  She can be reached at [phone]

## 2021-03-27 NOTE — Telephone Encounter (Signed)
Should schedule a virtual visit and we can discuss that way will inform Dr Carlota Raspberry of recent hospitalization

## 2021-03-27 NOTE — Progress Notes (Signed)
Initial Nutrition Assessment  DOCUMENTATION CODES:   Severe malnutrition in context of chronic illness  INTERVENTION:  - once small bore NGT placement confirmed: Osmolite 1.5 @ 25 ml/hr to advance by 10 ml every 24 hours to reach goal rate of 55 ml/hr with 45 ml Prosource TF BID, 1 packet Juven BID, and 150 ml free water every 4 hours.  - at goal rate, this regimen will provide 2250 kcal, 110 grams protein, and 1608 ml free water.  - checking serum vitamin B12 and folate.    NUTRITION DIAGNOSIS:   Severe Malnutrition related to chronic illness (Parkinson's disease) as evidenced by severe fat depletion, severe muscle depletion.  GOAL:   Patient will meet greater than or equal to 90% of their needs  MONITOR:   TF tolerance, Labs, Weight trends, Skin  REASON FOR ASSESSMENT:   Consult Enteral/tube feeding initiation and management, Wound healing  ASSESSMENT:   88  yo male with medical history listed in the chart of PAF, BPH, CAD, CHB, HTN, Parkinson's with dementia, OSA, and arthritis. He presented to the ED with worsening mentation. He was admitted in 01/2021 with PNA which at that time was presumed to be d/t aspiration. Notes indicate that patient has been less mobile since 11/2020. Patient has been at [facility] since 02/13/2021. He has been bedbound for several weeks. In the ED he was found to be COVID-19 positive. Admission dx of multifocal PNA.  Patient laying in bed with wife at bedside. Patient was seen by SLP yesterday for bedside swallow evaluation and recommendation was made for NPO status except necessary medications crushed in puree. Noted severe aspiration risk.   Pending small bore NGT placement by 2W staff.   Wife reports that patient had a fall and since that time has had decreased mobility. He has been bedbound for unknown amount of time.   She shares that patient has been at facility since the end of December and while there was on very soft foods and  that he did need feeding assistance. She reports that he has a chronic cough. The last time he ate was lunch on 2/1 and he ate very well for the meal.   Weight yesterday was documented as 168 lb. Weight on 10/24/20 was 175 lb. This indicates 7 lb weight loss (4% body weight) in the past 5 months; not significant for time frame.    Labs reviewed; BUN: 26 mg/dl.  Medications reviewed; 10 g lactulose TID, 40 mg solu-medrol BID, 17 g miralax BID, 2 tablets senokot BID.    NUTRITION - FOCUSED PHYSICAL EXAM:  Flowsheet Row Most Recent Value  Orbital Region Moderate depletion  Upper Arm Region Severe depletion  Thoracic and Lumbar Region Unable to assess  Buccal Region Severe depletion  Temple Region Severe depletion  Clavicle Bone Region Severe depletion  Clavicle and Acromion Bone Region Severe depletion  Scapular Bone Region Unable to assess  Dorsal Hand Moderate depletion  Patellar Region Moderate depletion  Anterior Thigh Region Moderate depletion  Posterior Calf Region Unable to assess  [boots]  Edema (RD Assessment) None  Hair Reviewed  Eyes Reviewed  Mouth Reviewed  [place] tongue]  Skin Reviewed  Nails Reviewed       Diet Order:   Diet Order             Diet NPO time specified Except for: Ice Chips, Sips with Meds  Diet effective now  EDUCATION NEEDS:   Education needs have been addressed  Skin:  Skin Assessment: Skin Integrity Issues: Skin Integrity Issues:: Stage IV Stage IV: sacrum  Last BM:  2/3 (type 4 x1, small amount)  Height:   Ht Readings from Last 1 Encounters:  02/25/21 6' (1.829 m)    Weight:   Wt Readings from Last 1 Encounters:  03/26/21 76.2 kg     BMI:  Body mass index is 22.78 kg/m.   Estimated Nutritional Needs:  Kcal:  2200-2400 kcal Protein:  110-120 grams Fluid:  >/= 2.5 L/day     Jarome Matin, MS, RD, LDN Inpatient Clinical Dietitian RD pager # available in [place]  After hours/weekend  pager # available in [facility]

## 2021-03-27 NOTE — Assessment & Plan Note (Addendum)
Overall poor prognosis and functional status with high risk of aspiration and decompensation.  DNR/DNI. Palliative care has been following.  Care transitioned to comfort care.

## 2021-03-28 ENCOUNTER — Inpatient Hospital Stay (HOSPITAL_COMMUNITY): Payer: Medicare HMO

## 2021-03-28 DIAGNOSIS — Z7189 Other specified counseling: Secondary | ICD-10-CM | POA: Diagnosis not present

## 2021-03-28 DIAGNOSIS — Z515 Encounter for palliative care: Secondary | ICD-10-CM | POA: Diagnosis not present

## 2021-03-28 DIAGNOSIS — R1310 Dysphagia, unspecified: Secondary | ICD-10-CM | POA: Diagnosis not present

## 2021-03-28 DIAGNOSIS — E43 Unspecified severe protein-calorie malnutrition: Secondary | ICD-10-CM

## 2021-03-28 DIAGNOSIS — U071 COVID-19: Secondary | ICD-10-CM | POA: Diagnosis not present

## 2021-03-28 DIAGNOSIS — J9601 Acute respiratory failure with hypoxia: Secondary | ICD-10-CM | POA: Diagnosis not present

## 2021-03-28 DIAGNOSIS — J90 Pleural effusion, not elsewhere classified: Secondary | ICD-10-CM | POA: Diagnosis not present

## 2021-03-28 DIAGNOSIS — J189 Pneumonia, unspecified organism: Secondary | ICD-10-CM | POA: Diagnosis not present

## 2021-03-28 LAB — CBC WITH DIFFERENTIAL/PLATELET
Abs Immature Granulocytes: 0.09 10*3/uL — ABNORMAL HIGH (ref 0.00–0.07)
Basophils Absolute: 0 10*3/uL (ref 0.0–0.1)
Basophils Relative: 0 %
Eosinophils Absolute: 0 10*3/uL (ref 0.0–0.5)
Eosinophils Relative: 0 %
HCT: 27 % — ABNORMAL LOW (ref 39.0–52.0)
Hemoglobin: 8.5 g/dL — ABNORMAL LOW (ref 13.0–17.0)
Immature Granulocytes: 1 %
Lymphocytes Relative: 7 %
Lymphs Abs: 0.8 10*3/uL (ref 0.7–4.0)
MCH: 29.6 pg (ref 26.0–34.0)
MCHC: 31.5 g/dL (ref 30.0–36.0)
MCV: 94.1 fL (ref 80.0–100.0)
Monocytes Absolute: 0.7 10*3/uL (ref 0.1–1.0)
Monocytes Relative: 6 %
Neutro Abs: 9.9 10*3/uL — ABNORMAL HIGH (ref 1.7–7.7)
Neutrophils Relative %: 86 %
Platelets: 270 10*3/uL (ref 150–400)
RBC: 2.87 MIL/uL — ABNORMAL LOW (ref 4.22–5.81)
RDW: 17.6 % — ABNORMAL HIGH (ref 11.5–15.5)
WBC: 11.5 10*3/uL — ABNORMAL HIGH (ref 4.0–10.5)
nRBC: 0 % (ref 0.0–0.2)

## 2021-03-28 LAB — C-REACTIVE PROTEIN: CRP: <0.5 mg/dL (ref <1.0)

## 2021-03-28 LAB — COMPREHENSIVE METABOLIC PANEL
ALT: <5 U/L (ref 0–44)
ALT: 13 U/L (ref 0–44)
AST: <5 U/L — ABNORMAL LOW (ref 15–41)
AST: 43 U/L — ABNORMAL HIGH (ref 15–41)
Albumin: <1.5 g/dL — ABNORMAL LOW (ref 3.5–5.0)
Albumin: 2.3 g/dL — ABNORMAL LOW (ref 3.5–5.0)
Alkaline Phosphatase: 38 U/L (ref 38–126)
Alkaline Phosphatase: 69 U/L (ref 38–126)
Anion gap: 10 (ref 5–15)
Anion gap: 7 (ref 5–15)
BUN: 21 mg/dL (ref 8–23)
BUN: 33 mg/dL — ABNORMAL HIGH (ref 8–23)
CO2: 14 mmol/L — ABNORMAL LOW (ref 22–32)
CO2: 24 mmol/L (ref 22–32)
Calcium: 4.5 mg/dL — CL (ref 8.9–10.3)
Calcium: 8.2 mg/dL — ABNORMAL LOW (ref 8.9–10.3)
Chloride: 109 mmol/L (ref 98–111)
Chloride: 104 mmol/L (ref 98–111)
Creatinine, Ser: 0.71 mg/dL (ref 0.61–1.24)
Creatinine, Ser: 0.78 mg/dL (ref 0.61–1.24)
GFR, Estimated: >60 mL/min (ref >60)
GFR, Estimated: >60 mL/min (ref >60)
Glucose, Bld: 92 mg/dL (ref 70–99)
Glucose, Bld: 143 mg/dL — ABNORMAL HIGH (ref 70–99)
Potassium: 2.3 mmol/L — CL (ref 3.5–5.1)
Potassium: 4.3 mmol/L (ref 3.5–5.1)
Sodium: 133 mmol/L — ABNORMAL LOW (ref 135–145)
Sodium: 135 mmol/L (ref 135–145)
Total Bilirubin: 0.4 mg/dL (ref 0.3–1.2)
Total Bilirubin: 0.5 mg/dL (ref 0.3–1.2)
Total Protein: 3.2 g/dL — ABNORMAL LOW (ref 6.5–8.1)
Total Protein: 5.6 g/dL — ABNORMAL LOW (ref 6.5–8.1)

## 2021-03-28 LAB — MAGNESIUM
Magnesium: 1.3 mg/dL — ABNORMAL LOW (ref 1.7–2.4)
Magnesium: 2 mg/dL (ref 1.7–2.4)
Magnesium: 2.2 mg/dL (ref 1.7–2.4)

## 2021-03-28 LAB — GLUCOSE, CAPILLARY
Glucose-Capillary: 97 mg/dL (ref 70–99)
Glucose-Capillary: 109 mg/dL — ABNORMAL HIGH (ref 70–99)
Glucose-Capillary: 75 mg/dL (ref 70–99)
Glucose-Capillary: 79 mg/dL (ref 70–99)

## 2021-03-28 LAB — D-DIMER, QUANTITATIVE: D-Dimer, Quant: <0.27 ug/mL-FEU (ref 0.00–0.50)

## 2021-03-28 LAB — HEMOGLOBIN AND HEMATOCRIT, BLOOD
HCT: 33 % — ABNORMAL LOW (ref 39.0–52.0)
Hemoglobin: 10.7 g/dL — ABNORMAL LOW (ref 13.0–17.0)

## 2021-03-28 LAB — PHOSPHORUS
Phosphorus: 7.4 mg/dL — ABNORMAL HIGH (ref 2.5–4.6)
Phosphorus: 2.6 mg/dL (ref 2.5–4.6)
Phosphorus: 2.5 mg/dL (ref 2.5–4.6)

## 2021-03-28 MED ORDER — ALBUMIN HUMAN 25 % IV SOLN
25.0000 g | Freq: Four times a day (QID) | INTRAVENOUS | Status: AC
  Administered 2021-03-28 – 2021-03-30 (×8): 25 g via INTRAVENOUS
  Filled 2021-03-28 (×8): qty 100

## 2021-03-28 MED ORDER — SODIUM CHLORIDE 0.9 % IV SOLN: INTRAVENOUS | Status: DC

## 2021-03-28 MED ORDER — IOHEXOL 300 MG/ML  SOLN
30.0000 mL | Freq: Once | INTRAMUSCULAR | Status: AC | PRN
  Administered 2021-03-28: 30 mL

## 2021-03-28 NOTE — Progress Notes (Signed)
pt had a major drop in Hgb. previous level 11 and now 8.5...no s/s of bleeding seen. Mag 1.3 and Phos 7.4  On call APP Md Norins notified. Orders placed

## 2021-03-28 NOTE — Consult Note (Signed)
Consultation Note Date: 03/28/2021   Patient Name: Chad Russell  DOB: 09/01/1926  MRN: [id number]  Age / Sex: 88 y.o., male  PCP: Garwin Brothers, MD Referring Physician: Dwyane Dee, MD  Reason for Consultation: Establishing goals of care  HPI/Patient Profile: 88 y.o. male    admitted on 04/13/2021    Clinical Assessment and Goals of Care: 88 year old gentleman with coronary artery disease hypertension Parkinson's with dementia arthritis, admitted to the hospital medicine service for worsening mentation, was hospitalized in December 2022 for pneumonia, has had gradual progressive functional decline since October 2022.  Was recently at [facility] health care for rehab attempt.  Found to be COVID-positive on work-up in this hospitalization and started on remdesivir, found to have stage IV sacral pressure ulcer, also on antibiotics. Consultation has been requested for CODE STATUS and goals of care discussions.  Hospital course also complicated difficulty swallowing and take oral intake.  NEXT OF KIN Wife who is present at bedside  Discussion/SUMMARY OF RECOMMENDATIONS   Patient's wife arrived at the bedside, and introduced myself and palliative care as follows: Palliative medicine is specialized medical care for people living with serious illness. It focuses on providing relief from the symptoms and stress of a serious illness. The goal is to improve quality of life for both the patient and the family. Goals of care: Broad aims of medical therapy in relation to the patient's values and preferences. Our aim is to provide medical care aimed at enabling patients to achieve the goals that matter most to them, given the circumstances of their particular medical situation and their constraints.  Brief life review performed.  Discussed with the patient's wife about patient's current condition.  Patient's wife simply stated  that she does not want people to " give up on him."  She talked about starting IV fluids and artificial nutrition and hydration.  She states that in spite of patient's current condition that he has a quality of life but preserving with artificial means. Will monitor hospital course and follow peripherally, will engage when we can be of more assistance. Thank you for the consult.  Code Status/Advance Care Planning: Full code   Symptom Management:     Palliative Prophylaxis:  Delirium Protocol  Additional Recommendations (Limitations, Scope, Preferences): Full Scope Treatment  Psycho-social/Spiritual:  Desire for further Chaplaincy support:yes Additional Recommendations: Education on Hospice  Prognosis:  Guarded   Discharge Planning: To Be Determined      Primary Diagnoses: Present on Admission:  Parkinson disease ([place])  HTN (hypertension)  Multifocal pneumonia  Constipation  Sacral decubitus ulcer  Acute respiratory failure with hypoxia (HCC)  Atrial fibrillation ([place])  COVID-19 virus infection  (Resolved) Acute metabolic encephalopathy   I have reviewed the medical record, interviewed the patient and family, and examined the patient. The following aspects are pertinent.  Past Medical History:  Diagnosis Date   Arthritis    Atrial fibrillation [facility])    Balance problem 10/31/2013   BPH (benign prostatic hyperplasia) 2014   Urology Dr [name] with  [facility] [phone]    Bradycardia    a. baseline HR in the 30's. Asymptomatic - no history of PPM placement.    CAD (coronary artery disease)    LHC 03/16/11: Mid LAD 10-20%, proximal circumflex 10%, mid RCA 20%, EF 55%.   Cataract    Complete heart block (HCC)    Fracture of femoral neck, right (HCC) 04/19/2017   GERD (gastroesophageal reflux disease)    takes occasional  zantac   Hx of echocardiogram    Echo 02/2011: EF 65-70%, normal wall motion, MAC, mild MR, mild LAE, mild RVE   Hypertension    OSA (obstructive  sleep apnea)    per patient went to have sleep study done about 2 years ago, bu they never F/U with him about results; but wife reports he still has periods of apnea at night    Parkinson disease ([place])    Talar fracture    casted no surg   UTI (urinary tract infection) 10/2020   Social History   Socioeconomic History   Marital status: Married    Spouse name: Mardene Celeste   Number of children: 0   Years of education: 18   Highest education level: Not on file  Occupational History   Occupation: Scientist, product/process development   Occupation: civil Chief Financial Officer  Tobacco Use   Smoking status: Former    Packs/day: 1.00    Years: 3.00    Pack years: 3.00    Types: Cigarettes    Quit date: 03/17/1955    Years since quitting: 66.0   Smokeless tobacco: Former  Scientific laboratory technician Use: Never used  Substance and Sexual Activity   Alcohol use: Yes    Alcohol/week: 14.0 standard drinks    Types: 7 Cans of beer, 7 Standard drinks or equivalent per week   Drug use: No   Sexual activity: Not Currently    Birth control/protection: None  Other Topics Concern   Not on file  Social History Narrative   Civil engineer, 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  Has no children.     Right handed    Social Determinants of Health   Financial Resource Strain: Not on file  Food Insecurity: Not on file  Transportation Needs: Not on file  Physical Activity: Not on file  Stress: Not on file  Social Connections: Not on file   Family History  Problem Relation Age of Onset   Hypertension Mother    Multiple sclerosis Sister    Scheduled Meds:  carbidopa-levodopa  1 tablet Oral QID   Chlorhexidine Gluconate Cloth  6 each Topical Q0600   enoxaparin (LOVENOX) injection  75 mg Subcutaneous Q12H   feeding supplement (PROSource TF)  45 mL Per Tube BID   free water  150 mL Per Tube Q6H    Gerhardt's butt cream   Topical Daily   lactulose  10 g Oral TID   lisinopril  10 mg Oral Daily   methylPREDNISolone (SOLU-MEDROL) injection  40 mg Intravenous BID   mupirocin ointment  1 application Nasal BID   nutrition supplement (JUVEN)  1 packet Oral BID BM   polyethylene glycol  17 g Oral BID   senna-docusate  2 tablet Oral BID   sodium phosphate  1 enema Rectal Once   timolol  1 drop Both Eyes QHS  Continuous Infusions:  sodium chloride 75 mL/hr at 03/28/21 1350   albumin human     ceFEPime (MAXIPIME) IV 2 g (03/28/21 1351)   feeding supplement (OSMOLITE 1.5 CAL) Stopped (03/27/21 1731)   metronidazole 500 mg (03/28/21 1352)   remdesivir 100 mg in NS 100 mL 100 mg (03/28/21 0929)   vancomycin Stopped (03/27/21 2030)   PRN Meds:.acetaminophen, atropine, bisacodyl, HYDROcodone-acetaminophen, ondansetron **OR** ondansetron (ZOFRAN) IV Medications Prior to Admission:  Prior to Admission medications   Medication Sig Start Date End Date Taking? Authorizing Provider  acetaminophen (TYLENOL) 325 MG tablet Take 650 mg by mouth every 6 (six) hours as needed for mild pain.   Yes [provider]  Apoaequorin (PREVAGEN) 10 MG CAPS Take 1 capsule by mouth daily.   Yes [provider]  calcium carbonate (TUMS - DOSED IN MG ELEMENTAL CALCIUM) 500 MG chewable tablet Chew 1 tablet (200 mg of elemental calcium total) by mouth 2 (two) times daily with a meal. Patient taking differently: Chew 1 tablet by mouth 2 (two) times daily. 10/20/20  Yes Sheikh, Omair Latif, DO  carbidopa-levodopa (SINEMET) 25-100 MG tablet Take 1.5 tablets by mouth in the morning and at bedtime. 06/11/19  Yes Patel, Donika K, DO  HYDROcodone-acetaminophen (NORCO/VICODIN) 5-325 MG tablet Take 1 tablet by mouth every 4 (four) hours as needed for moderate pain.   Yes [provider]  lisinopril (ZESTRIL) 10 MG tablet Take 1 tablet (10 mg total) by mouth daily. 10/24/20 04/14/2021 Yes Lendon Colonel, NP   magnesium oxide (MAG-OX) 400 MG tablet Take 400 mg by mouth daily. **one tablet by mouth every other day**   Yes [provider]  Multiple Vitamins-Minerals (PRESERVISION AREDS 2+MULTI VIT PO) Take 1 capsule by mouth 2 (two) times daily.   Yes [provider]  Nutritional Supplements (PROSTATE PO) Take 1 tablet by mouth in the morning, at noon, and at bedtime.   Yes [provider]  timolol (TIMOPTIC) 0.5 % ophthalmic solution INSTILL ONE DROP IN [facility] EYE DAILY Patient taking differently: Place 1 drop into both eyes at bedtime. 09/08/20  Yes Rankin, Clent Demark, MD  vitamin C (ASCORBIC ACID) 500 MG tablet Take 500 mg by mouth daily.   Yes [provider]  XARELTO 20 MG TABS tablet TAKE ONE TABLET BY MOUTH DAILY WITH SUPPER Patient taking differently: Take 20 mg by mouth daily with supper. 01/06/21  Yes Minus Breeding, MD  feeding supplement (ENSURE ENLIVE / ENSURE PLUS) LIQD Take 237 mLs by mouth daily. Patient not taking: Reported on 02/25/2021 10/20/20   Raiford Noble Latif, DO   Allergies  Allergen Reactions   Doxycycline Other (See Comments)    Upset stomach   Keflex [Cephalexin] Other (See Comments)    Abdominal discomfort 04-19-17 Pt has tolerated orally; tolerated IV Rocephin   Review of Systems + weakness  Physical Exam Weak appearing gentleman resting in bed in Dry oral mucosa Coarse breath sounds S1-S2 Abdomen not distended  Vital Signs: BP (!) 153/81 (BP Location: Left Arm)    Pulse (!) 55    Temp 98.1 F (36.7 C) (Axillary)    Resp 12    Wt 76.2 kg    SpO2 99%    BMI 22.78 kg/m  Pain Scale: PAINAD   Pain Score: Asleep   SpO2: SpO2: 99 % O2 Device:SpO2: 99 % O2 Flow Rate: .O2 Flow Rate (L/min): 6 L/min  IO: Intake/output summary:  Intake/Output Summary (Last 24 hours) at 03/28/2021 1359 Last data filed  at 03/28/2021 0535 Gross per 24 hour  Intake 965.54 ml  Output 400 ml  Net 565.54 ml    LBM: Last BM Date: 03/28/21 Baseline  Weight: Weight: 74.8 kg Most recent weight: Weight: 76.2 kg     Palliative Assessment/Data:   PPS 30%  Time In:  1300 Time Out:  1400 Time Total:  60  Greater than 50%  of this time was spent counseling and coordinating care related to the above assessment and plan.  Signed by: Loistine Chance, MD   Please contact Palliative Medicine Team phone at [phone] for questions and concerns.  For individual provider: See Shea Evans

## 2021-03-28 NOTE — Progress Notes (Signed)
.  He Progress Note   Patient: Chad Russell ASN:[id number] DOB: Jul 02, 1926 DOA: 04/09/2021     3 DOS: the patient was seen and examined on 03/28/2021   Brief hospital course: Mr. Lando is a 88  yo male with PMH PAF, BPH, CAD, CHB, HTN, Parkinson's with dementia, OSA, arthritis who presented to the ER with worsening mentation.  He was recently admitted in December 2022 for PNA (negative covid at that time) due to presumed aspiration.  He has been less mobile since October 2022 with significant progressive decline from a mobility standpoint.  He has been residing at [facility] since discharge on 02/13/21.  He has been bedbound for several weeks since that time as well.  On workup he was reported to have increased confusion compared to baseline and associated fever.  He was found to be covid positive on workup and was started on Remdesivir. He also has a stage 4 sacral pressure ulcer that has had difficulty healing.   Assessment and Plan: * Multifocal pneumonia- (present on admission) - Differential includes ongoing aspiration from underlying advanced Parkinson's disease/dementia versus contribution from COVID-pneumonia - given MRSA positive swab, will change abx to vanc, cefepime, flagyl for now -Trend procalcitonin -Follow-up cultures  COVID-19 virus infection- (present on admission) - continue remdesivir - trend inflammatory markers - CT value 33  Acute respiratory failure with hypoxia ([place])- (present on admission) - New O2 requirement in setting of PNA + Fever + COVID positive. - continue steroids - wean O2 as able   Sacral decubitus ulcer- (present on admission) - No cellulitis or osteomyelitis on CT - follow rec's from wound care consult - low suspicion this is going to heal well with his bed bound state and poor functional status/physical reserve; I've communicated this with his wife, but she is disproportionately hopeful he will have a tremendous recovery in  general  Goals of care, counseling/discussion - Have discussed his case with his wife at length for the past 2 days. There is certainly a component of denial unfortunately but also just not being fully aware of the gravity of his disease state and his overall QOL and functional status. - I have continued to emphasize his overall poor status etc as noted the past 2 days. Wife remains adamant for full aggressive treatment, demanding that "normal saline" be started this morning and why he hadn't already been sent down to radiology for a feeding tube. I tried explaining in detail what 3rd spacing is and why just giving him fluids would possibly worsen his respiratory status and the pathophysiology as to why some treatments she may be wanting might actually be considered "doing harm". She wasn't convinced unfortunately. I also explained that going to radiology for procedures requires orders and consent and conversations and that he would not just "be sent" to radiology b/c the attempt wasn't successful yesterday.  - after much explanation, we decided to do IV albumin plus NS in efforts to minimize 3rd spacing, but should his respiratory status worsen, we will have to stop IVF. Furthermore, I ordered radiology to attempt tube placement as well so we could start TF after and I also discussed the lung nodules and R effusion as I want to try and get a sample of this fluid to test it. If enough to tap we'll see if radiology can perform a thoracentesis - unfortunately his wife was not interested in talking to palliative care this morning too - prognosis remains guarded at best with easy potential for  worsening   Lung nodule - Multiple lung nodules noted on CT chest (RUL, RLL, LUL, LLL). Also has R>L pleural effusion.  Differential for effusion would include parapneumonic versus malignant -Regardless, given his extremely frail state, poor functional status, underlying dementia he would be a extremely poor treatment  candidate - updated wife on lung nodules on 2/4; she says she "already knew about them" and if "he never got cancer in the past he probably doesn't have it now" - we'll try and see if a thoracentesis is possible  Constipation- (present on admission) - s/p enema in ER - follow up response as may need disimpaction and/or more laxatives  Parkinson disease ([place])- (present on admission) - Advanced PD with dementia, although wife states he is more lucid than chart review implies - significant cerebral atrophy appreciated on CT head as well - continue sinemet; wife states he "locks up" often and is stiff. His sinemet is BID dosing, therefore I will adjust to QID and modify dose - he does note feeling improvement in his tremors since sinemet was adjusted - code status still Full code  Dysphagia - Underwent SLP eval on 03/26/2021 with overt signs of dysphagia and risk for aspiration. - recommended to remain NPO; ice chips are okay - unsuccessful attempt for cortrak placement with nursing on 2/3 despite multiple attempts - order placed for radiology to attempt tube placement in efforts to be able to start TF - MBSS is tentative plan next time with SLP; if still not able to advance, we'll need to then discuss long term plan  Acute metabolic encephalopathy-resolved as of 03/27/2021, (present on admission) Delirium due to PNA / COVID on top of chronic parkinson's dementia.  Atrial fibrillation ([place])- (present on admission) Holding Xarelto while NPO. - continue lovenox   HTN (hypertension)- (present on admission) - mild elevation on admission - use labetalol or hydralazine for now as well  - resume home lisinopril       Subjective: No events overnight. Wife was upset this morning that she feels like "we're giving up on him". She was short spoken and only wanted IVF started and didn't understand why he didn't already have an NGT in place. See my long discussion under A&P as well.  After a lengthy  explanation justifying medical decision making she was at least partly in understanding as to why things were done and not done yesterday. We'll order radiology to place NGT today and see if they're able to get a thoracentesis as well.   Physical Exam: Vitals:   03/28/21 0222 03/28/21 0704 03/28/21 0849 03/28/21 1534  BP: (!) 146/76 (!) 147/82 (!) 153/81 (!) 155/101  Pulse: 83 82 (!) 55 76  Resp: 16 16 12 15   Temp: 98 F (36.7 C) 98.4 F (36.9 C) 98.1 F (36.7 C) 97.8 F (36.6 C)  TempSrc: Oral Oral Axillary Axillary  SpO2: 100% 98% 99% 100%  Weight:      Physical Exam Constitutional:      Comments: Chronically ill-appearing elderly gentleman lying in bed in no distress  HENT:     Head: Normocephalic and atraumatic.     Mouth/Throat:     Mouth: Mucous membranes are dry.  Eyes:     Extraocular Movements: Extraocular movements intact.  Cardiovascular:     Rate and Rhythm: Normal rate and regular rhythm.  Pulmonary:     Breath sounds: Rhonchi and rales present. No wheezing.  Abdominal:     General: Bowel sounds are normal. There is no  distension.     Palpations: Abdomen is soft.     Tenderness: There is no abdominal tenderness.  Musculoskeletal:        General: No swelling.     Cervical back: Normal range of motion and neck supple.  Skin:    General: Skin is warm.  Neurological:     Comments: Chronic underlying dementia appreciated.  Follows commands and moves all 4 extremities albeit extremely weak   DVT ppx: Lovenox   Data Reviewed:  I have Reviewed nursing notes, Vitals, and Lab results since pt's last encounter. Pertinent lab results Na 135, creat 0.78, BUN 33, Alb 2.3, ALT 13 I have independently visualized and interpreted imaging CT chest which showed multiple lung nodules, R>L effusion. I have reviewed the last note from  staff over past 24 hours,  I have discussed pt's care plan and test results with wife and nursing staff.  Family Communication: Wife  bedside  Disposition: Status is: Inpatient Remains inpatient appropriate because: ongoing covid treatment, pna treatment, and failure to thrive     Planned Discharge Destination: Skilled nursing facility  DVT ppx: Lovenox      Author: Dwyane Dee, MD 03/28/2021 5:20 PM  For on call review [email or web address].

## 2021-03-28 NOTE — Procedures (Signed)
Pre procedure Dx: Dysphagia: pleural effusion Post Procedure Dx: Same  Successful fluoroscopic guided insertion of nasogastric tube with tip terminating within the stomach. The NG tube is ready for immediate use.  Bedside US of the chest demonstrates trace bilateral effusion, right great than left, too small to allow for safe US thoracentesis.  No thoracentesis attempted.     EBL: None Complications: None immediate  Ronny Bacon, MD Pager #: [phone]

## 2021-03-29 ENCOUNTER — Inpatient Hospital Stay: Payer: Self-pay

## 2021-03-29 DIAGNOSIS — J189 Pneumonia, unspecified organism: Secondary | ICD-10-CM | POA: Diagnosis not present

## 2021-03-29 DIAGNOSIS — G9341 Metabolic encephalopathy: Secondary | ICD-10-CM | POA: Diagnosis not present

## 2021-03-29 DIAGNOSIS — U071 COVID-19: Secondary | ICD-10-CM | POA: Diagnosis not present

## 2021-03-29 DIAGNOSIS — J9601 Acute respiratory failure with hypoxia: Secondary | ICD-10-CM | POA: Diagnosis not present

## 2021-03-29 LAB — COMPREHENSIVE METABOLIC PANEL
ALT: 26 U/L (ref 0–44)
AST: 60 U/L — ABNORMAL HIGH (ref 15–41)
Albumin: 3.3 g/dL — ABNORMAL LOW (ref 3.5–5.0)
Alkaline Phosphatase: 67 U/L (ref 38–126)
Anion gap: 8 (ref 5–15)
BUN: 29 mg/dL — ABNORMAL HIGH (ref 8–23)
CO2: 24 mmol/L (ref 22–32)
Calcium: 8.7 mg/dL — ABNORMAL LOW (ref 8.9–10.3)
Chloride: 101 mmol/L (ref 98–111)
Creatinine, Ser: 0.62 mg/dL (ref 0.61–1.24)
GFR, Estimated: >60 mL/min (ref >60)
Glucose, Bld: 158 mg/dL — ABNORMAL HIGH (ref 70–99)
Potassium: 4.1 mmol/L (ref 3.5–5.1)
Sodium: 133 mmol/L — ABNORMAL LOW (ref 135–145)
Total Bilirubin: 0.9 mg/dL (ref 0.3–1.2)
Total Protein: 6.1 g/dL — ABNORMAL LOW (ref 6.5–8.1)

## 2021-03-29 LAB — CBC WITH DIFFERENTIAL/PLATELET
Abs Immature Granulocytes: 0.1 10*3/uL — ABNORMAL HIGH (ref 0.00–0.07)
Basophils Absolute: 0 10*3/uL (ref 0.0–0.1)
Basophils Relative: 0 %
Eosinophils Absolute: 0 10*3/uL (ref 0.0–0.5)
Eosinophils Relative: 0 %
HCT: 31.5 % — ABNORMAL LOW (ref 39.0–52.0)
Hemoglobin: 10 g/dL — ABNORMAL LOW (ref 13.0–17.0)
Immature Granulocytes: 1 %
Lymphocytes Relative: 8 %
Lymphs Abs: 0.9 10*3/uL (ref 0.7–4.0)
MCH: 29.6 pg (ref 26.0–34.0)
MCHC: 31.7 g/dL (ref 30.0–36.0)
MCV: 93.2 fL (ref 80.0–100.0)
Monocytes Absolute: 0.8 10*3/uL (ref 0.1–1.0)
Monocytes Relative: 7 %
Neutro Abs: 9.3 10*3/uL — ABNORMAL HIGH (ref 1.7–7.7)
Neutrophils Relative %: 84 %
Platelets: 311 10*3/uL (ref 150–400)
RBC: 3.38 MIL/uL — ABNORMAL LOW (ref 4.22–5.81)
RDW: 17.6 % — ABNORMAL HIGH (ref 11.5–15.5)
WBC: 11.1 10*3/uL — ABNORMAL HIGH (ref 4.0–10.5)
nRBC: 0 % (ref 0.0–0.2)

## 2021-03-29 LAB — C-REACTIVE PROTEIN: CRP: 3.8 mg/dL — ABNORMAL HIGH (ref <1.0)

## 2021-03-29 LAB — GLUCOSE, CAPILLARY
Glucose-Capillary: 165 mg/dL — ABNORMAL HIGH (ref 70–99)
Glucose-Capillary: 152 mg/dL — ABNORMAL HIGH (ref 70–99)
Glucose-Capillary: 96 mg/dL (ref 70–99)
Glucose-Capillary: 147 mg/dL — ABNORMAL HIGH (ref 70–99)
Glucose-Capillary: 127 mg/dL — ABNORMAL HIGH (ref 70–99)

## 2021-03-29 LAB — MAGNESIUM: Magnesium: 2.3 mg/dL (ref 1.7–2.4)

## 2021-03-29 LAB — D-DIMER, QUANTITATIVE: D-Dimer, Quant: 1.95 ug/mL-FEU — ABNORMAL HIGH (ref 0.00–0.50)

## 2021-03-29 LAB — PHOSPHORUS: Phosphorus: 2.2 mg/dL — ABNORMAL LOW (ref 2.5–4.6)

## 2021-03-29 MED ORDER — SODIUM PHOSPHATES 45 MMOLE/15ML IV SOLN
30.0000 mmol | Freq: Once | INTRAVENOUS | Status: DC
  Filled 2021-03-29: qty 10

## 2021-03-29 NOTE — Progress Notes (Signed)
At bedside to insert PICC line. Pt noted to be very stiff upon assessment. Guide wire unable to thread past the midclavicular region and unable to thread centrally. Pt c/o shoulder pain and requested to stop procedure. Site cleaned with CHG and covered with a sterile gauze. Very scant bleeding noted from site. Reported to primary RN. Will make attending MD aware. Please refer PICC  placements to IR if needed.

## 2021-03-29 NOTE — Progress Notes (Signed)
A consult was placed to the IV Therapist for new iv access;  the previous site in the Right arm infiltrated, and R arm is extremely edematous;  Left arm was assessed with ultrasound, but pt is limited in how far he can abduct his left arm and turn his arm over; very poor venous status; finger tips are cyanotic; able to place a small PIV on the 2nd attempt with help from the pt's nurse;

## 2021-03-29 NOTE — Progress Notes (Signed)
Progress Note   Patient: LYNKIN SAINI ZHG:[id number] DOB: 02/12/1927 DOA: 04/14/2021     4 DOS: the patient was seen and examined on 03/29/2021   Brief hospital course: Mr. Vallin is a 88  yo male with PMH PAF, BPH, CAD, CHB, HTN, Parkinson's with dementia, OSA, arthritis who presented to the ER with worsening mentation.  He was recently admitted in December 2022 for PNA (negative covid at that time) due to presumed aspiration.  He has been less mobile since October 2022 with significant progressive decline from a mobility standpoint.  He has been residing at [facility] since discharge on 02/13/21.  He has been bedbound for several weeks since that time as well.  On workup he was reported to have increased confusion compared to baseline and associated fever.  He was found to be covid positive on workup and was started on Remdesivir. He also has a stage 4 sacral pressure ulcer that has had difficulty healing.   Assessment and Plan: * Multifocal pneumonia- (present on admission) - Differential includes ongoing aspiration from underlying advanced Parkinson's disease/dementia versus contribution from COVID-pneumonia - given MRSA positive swab, will change abx to vanc, cefepime, flagyl for now -Trend procalcitonin -Follow-up cultures: Negative blood cx for 4 days  COVID-19 virus infection- (present on admission) - continue remdesivir - trend inflammatory markers - CT value 33  Acute respiratory failure with hypoxia ([place])- (present on admission) - New O2 requirement in setting of PNA + Fever + COVID positive. - continue steroids - wean O2 as able   Sacral decubitus ulcer- (present on admission) - No cellulitis or osteomyelitis on CT - follow rec's from wound care consult - low suspicion this is going to heal well with his bed bound state and poor functional status/physical reserve; I've communicated this with his wife, but she is disproportionately hopeful he will have a  tremendous recovery in general  Goals of care, counseling/discussion - Have discussed his case with his wife at length since admission. There is certainly a component of denial unfortunately, but also just not being fully aware of the gravity of his disease state and his overall QOL and functional status. - I have continued to emphasize his overall poor status etc as noted. Wife remains adamant for full aggressive treatment.  - NGT placed 2/4 with IR. TF started 2/4 - unfortunately his wife was not interested in talking to palliative care on 2/4 - prognosis remains guarded at best with easy potential for worsening   Lung nodule - Multiple lung nodules noted on CT chest (RUL, RLL, LUL, LLL). Also has R>L pleural effusion.  Differential for effusion would include parapneumonic versus malignant -Regardless, given his extremely frail state, poor functional status, underlying dementia he would be a extremely poor treatment candidate - updated wife on lung nodules on 2/4; she says she "already knew about them" and if "he never got cancer in the past he probably doesn't have it now" - per radiology fluid collection in right pleura not enough for thoracentesis at this time  Constipation- (present on admission) - s/p enema in ER - follow up response as may need disimpaction and/or more laxatives  Parkinson disease ([place])- (present on admission) - Advanced PD with dementia, although wife states he is more lucid than chart review implies - significant cerebral atrophy appreciated on CT head as well - continue sinemet; wife states he "locks up" often and is stiff. His sinemet is BID dosing, therefore I will adjust to QID - he does  note feeling improvement in his tremors since sinemet was adjusted - code status still Full code  Dysphagia - Underwent SLP eval on 03/26/2021 with overt signs of dysphagia and risk for aspiration. - recommended to remain NPO; ice chips are okay - unsuccessful attempt for  cortrak placement with nursing on 2/3 despite multiple attempts - IR able to place NGT on 03/28/21 - MBSS is tentative plan next time with SLP; if still not able to advance, we'll need to then discuss long term plan - continue TF for now   Acute metabolic encephalopathy-resolved as of 03/27/2021, (present on admission) Delirium due to PNA / COVID on top of chronic parkinson's dementia.  Atrial fibrillation ([place])- (present on admission) Holding Xarelto while NPO. - continue lovenox   HTN (hypertension)- (present on admission) - mild elevation on admission - use labetalol or hydralazine for now as well  - resume home lisinopril       Subjective:  No events overnight.  He was able to have feeding tube placed with radiology yesterday.  He remains about in his usual state in bed which is eyes closed, not moving, and minimally interactive. Updated wife in hallway as she was arriving today.  Physical Exam: Vitals:   03/28/21 2000 03/29/21 0000 03/29/21 0254 03/29/21 1059  BP: (!) 162/98 (!) 150/94 (!) 140/92 (!) 152/75  Pulse: 75  94 (!) 59  Resp: 16 16 18 16   Temp: 98 F (36.7 C) 98.3 F (36.8 C) 98 F (36.7 C) 98 F (36.7 C)  TempSrc: Axillary Axillary  Oral  SpO2: 100%   99%  Weight:      Height:      Physical Exam Constitutional:      Comments: Chronically ill-appearing elderly gentleman lying in bed in no distress  HENT:     Head: Normocephalic and atraumatic.     Mouth/Throat:     Mouth: Mucous membranes are dry.  Eyes:     Extraocular Movements: Extraocular movements intact.  Cardiovascular:     Rate and Rhythm: Normal rate and regular rhythm.  Pulmonary:     Breath sounds: Rhonchi and rales present. No wheezing.  Abdominal:     General: Bowel sounds are normal. There is no distension.     Palpations: Abdomen is soft.     Tenderness: There is no abdominal tenderness.  Musculoskeletal:        General: No swelling.     Cervical back: Normal range of motion and  neck supple.  Skin:    General: Skin is warm.  Neurological:     Comments: Chronic underlying dementia appreciated.  Follows commands and moves all 4 extremities albeit extremely weak   DVT ppx: Lovenox   Data Reviewed:  I have Reviewed nursing notes, Vitals, and Lab results since pt's last encounter. Pertinent lab results Na 133, creat 0.62, BUN 29, Alb 3.3, ALT 26 I have independently visualized and interpreted imaging CT chest which showed multiple lung nodules, R>L effusion. I have reviewed the last note from  staff over past 24 hours,  I have discussed pt's care plan and test results with wife and nursing staff.  Family Communication: Wife   Disposition: Status is: Inpatient Remains inpatient appropriate because: ongoing covid treatment, pna treatment, and failure to thrive     Planned Discharge Destination: Skilled nursing facility  DVT ppx: Lovenox      Author: Dwyane Dee, MD 03/29/2021 12:59 PM  For on call review [email or web address].

## 2021-03-30 ENCOUNTER — Ambulatory Visit: Payer: Medicare HMO | Admitting: Neurology

## 2021-03-30 DIAGNOSIS — G9341 Metabolic encephalopathy: Secondary | ICD-10-CM | POA: Diagnosis not present

## 2021-03-30 DIAGNOSIS — Z7189 Other specified counseling: Secondary | ICD-10-CM | POA: Diagnosis not present

## 2021-03-30 DIAGNOSIS — U071 COVID-19: Secondary | ICD-10-CM | POA: Diagnosis not present

## 2021-03-30 DIAGNOSIS — Z515 Encounter for palliative care: Secondary | ICD-10-CM | POA: Diagnosis not present

## 2021-03-30 DIAGNOSIS — E43 Unspecified severe protein-calorie malnutrition: Secondary | ICD-10-CM | POA: Diagnosis not present

## 2021-03-30 DIAGNOSIS — J189 Pneumonia, unspecified organism: Secondary | ICD-10-CM | POA: Diagnosis not present

## 2021-03-30 DIAGNOSIS — J9601 Acute respiratory failure with hypoxia: Secondary | ICD-10-CM | POA: Diagnosis not present

## 2021-03-30 LAB — CULTURE, BLOOD (ROUTINE X 2)
Culture: NO GROWTH
Culture: NO GROWTH
Special Requests: ADEQUATE
Special Requests: ADEQUATE

## 2021-03-30 LAB — COMPREHENSIVE METABOLIC PANEL
ALT: 35 U/L (ref 0–44)
AST: 45 U/L — ABNORMAL HIGH (ref 15–41)
Albumin: 3.4 g/dL — ABNORMAL LOW (ref 3.5–5.0)
Alkaline Phosphatase: 61 U/L (ref 38–126)
Anion gap: 5 (ref 5–15)
BUN: 22 mg/dL (ref 8–23)
CO2: 28 mmol/L (ref 22–32)
Calcium: 8.7 mg/dL — ABNORMAL LOW (ref 8.9–10.3)
Chloride: 98 mmol/L (ref 98–111)
Creatinine, Ser: 0.52 mg/dL — ABNORMAL LOW (ref 0.61–1.24)
GFR, Estimated: >60 mL/min (ref >60)
Glucose, Bld: 138 mg/dL — ABNORMAL HIGH (ref 70–99)
Potassium: 3.8 mmol/L (ref 3.5–5.1)
Sodium: 131 mmol/L — ABNORMAL LOW (ref 135–145)
Total Bilirubin: 0.5 mg/dL (ref 0.3–1.2)
Total Protein: 6 g/dL — ABNORMAL LOW (ref 6.5–8.1)

## 2021-03-30 LAB — MAGNESIUM: Magnesium: 2.1 mg/dL (ref 1.7–2.4)

## 2021-03-30 LAB — CBC WITH DIFFERENTIAL/PLATELET
Abs Immature Granulocytes: 0.09 10*3/uL — ABNORMAL HIGH (ref 0.00–0.07)
Basophils Absolute: 0 10*3/uL (ref 0.0–0.1)
Basophils Relative: 0 %
Eosinophils Absolute: 0 10*3/uL (ref 0.0–0.5)
Eosinophils Relative: 0 %
HCT: 28.4 % — ABNORMAL LOW (ref 39.0–52.0)
Hemoglobin: 9.3 g/dL — ABNORMAL LOW (ref 13.0–17.0)
Immature Granulocytes: 1 %
Lymphocytes Relative: 12 %
Lymphs Abs: 1 10*3/uL (ref 0.7–4.0)
MCH: 29.8 pg (ref 26.0–34.0)
MCHC: 32.7 g/dL (ref 30.0–36.0)
MCV: 91 fL (ref 80.0–100.0)
Monocytes Absolute: 0.6 10*3/uL (ref 0.1–1.0)
Monocytes Relative: 7 %
Neutro Abs: 6.6 10*3/uL (ref 1.7–7.7)
Neutrophils Relative %: 80 %
Platelets: 309 10*3/uL (ref 150–400)
RBC: 3.12 MIL/uL — ABNORMAL LOW (ref 4.22–5.81)
RDW: 17.2 % — ABNORMAL HIGH (ref 11.5–15.5)
WBC: 8.4 10*3/uL (ref 4.0–10.5)
nRBC: 0 % (ref 0.0–0.2)

## 2021-03-30 LAB — D-DIMER, QUANTITATIVE: D-Dimer, Quant: 0.33 ug/mL-FEU (ref 0.00–0.50)

## 2021-03-30 LAB — C-REACTIVE PROTEIN: CRP: 2.6 mg/dL — ABNORMAL HIGH (ref <1.0)

## 2021-03-30 LAB — GLUCOSE, CAPILLARY
Glucose-Capillary: 119 mg/dL — ABNORMAL HIGH (ref 70–99)
Glucose-Capillary: 137 mg/dL — ABNORMAL HIGH (ref 70–99)
Glucose-Capillary: 138 mg/dL — ABNORMAL HIGH (ref 70–99)
Glucose-Capillary: 140 mg/dL — ABNORMAL HIGH (ref 70–99)
Glucose-Capillary: 157 mg/dL — ABNORMAL HIGH (ref 70–99)
Glucose-Capillary: 177 mg/dL — ABNORMAL HIGH (ref 70–99)

## 2021-03-30 LAB — PHOSPHORUS: Phosphorus: 2.1 mg/dL — ABNORMAL LOW (ref 2.5–4.6)

## 2021-03-30 NOTE — TOC Progression Note (Deleted)
Transition of Care [facility]) - Progression Note    Patient Details  Name: Chad Russell MRN: [id number] Date of Birth: August 05, 1926  [facility] CM/SW Contact  Leeroy Cha, RN Phone Number: 03/30/2021, 9:09 AM  Clinical Narrative:       Expected Discharge Plan: Home/Self Care Barriers to Discharge: Continued Medical Work up  Expected Discharge Plan and Services Expected Discharge Plan: Home/Self Care   Discharge Planning Services: CM Consult   Living arrangements for the past 2 months: Single Family Home                                       Social Determinants of Health (SDOH) Interventions    Readmission Risk Interventions No flowsheet data found.

## 2021-03-30 NOTE — Progress Notes (Signed)
Speech Language Pathology Treatment: Dysphagia  Patient Details Name: Chad Russell MRN: [id number] DOB: 1926/06/04 Today's Date: 03/30/2021 Time: 1025-1050 SLP Time Calculation (min) (ACUTE ONLY): 25 min  Assessment / Plan / Recommendation Clinical Impression  Patient seen by SLP for skilled treatment focusing on dysphagia. His spouse entered room towards end of session. Patient had eyes closed but was able to respond appropriately to questions, often with significant delay. When asked how he felt he said, "Not as good as yesterday" but he denied being in pain. He did ask SLP at one point, "How many days do you think I have?" but as patient unable to elaborate further, SLP unsure if he meant days in terms of length of hospital stay or life expectancy. When asked if he and his wife have been discussing things he said, "we both know there are some possibilities" but again he did not elaborate. He declined ice chips but did accept PO's of water, soaked into toothette sponge. He was able to suck from sponge and laryngeal elevation was observed. No overt s/s aspiration or penetration however this was a small amount (approximately one spoonful of water total). SLP spoke with patient and spouse regarding recommendation for continued NPO as patient still not awake and alert enough to safely consume PO's. SLP will continue to follow patient to determine readiness for further PO trials however prognosis for this is guarded at this time.   HPI HPI: Patient is a 88 y.o. male with PMH: PAF, BPH, CHB, HTN, Parkinson's disease with dementia, OSA, arthritis who presented to the ER with worsening mentation. He was recently admitted (December 2022) for PNA due to presumed aspiration and had been residing at [facility] since 12/23 discharge.  During current admission he was found to be Covid positive. He also has a stage 4 sacral pressure ulcer. CXR revealed mild-moderate bilateral multilobar PNA.       SLP Plan  Continue with current plan of care      Recommendations for follow up therapy are one component of a multi-disciplinary discharge planning process, led by the attending physician.  Recommendations may be updated based on patient status, additional functional criteria and insurance authorization.    Recommendations  Diet recommendations: NPO Medication Administration: Other (Comment) (necessary meds only, crushed in puree) Postural Changes and/or Swallow Maneuvers: Seated upright 90 degrees                Oral Care Recommendations: Oral care QID;Staff/trained caregiver to provide oral care Follow Up Recommendations: Skilled nursing-short term rehab (<3 hours/day) Assistance recommended at discharge: Frequent or constant Supervision/Assistance SLP Visit Diagnosis: Dysphagia, unspecified (R13.10) Plan: Continue with current plan of care         Sonia Baller, MA, CCC-SLP Speech Therapy

## 2021-03-30 NOTE — TOC Progression Note (Signed)
Transition of Care [facility]) - Progression Note    Patient Details  Name: Chad Russell MRN: [id number] Date of Birth: 1926-06-25  Transition of Care [facility]) CM/SW Contact  Leeroy Cha, RN Phone Number: 03/30/2021, 9:44 AM  Clinical Narrative:    [place] for toc needs   Expected Discharge Plan: Home/Self Care Barriers to Discharge: Continued Medical Work up  Expected Discharge Plan and Services Expected Discharge Plan: Home/Self Care   Discharge Planning Services: CM Consult   Living arrangements for the past 2 months: Single Family Home                                       Social Determinants of Health (SDOH) Interventions    Readmission Risk Interventions No flowsheet data found.

## 2021-03-30 NOTE — Care Management Important Message (Signed)
Important Message  Patient Details IM Letter placed in Patients room. Name: Chad Russell MRN: [id number] Date of Birth: 06-22-1926   Medicare Important Message Given:  Yes     Kerin Salen 03/30/2021, 1:03 PM

## 2021-03-30 NOTE — Progress Notes (Addendum)
MD and pharmacy consulted and notified of infiltration noted post cefepime infusion, cold and heat alternated on the site, no new order obtained, wife at bedside notified, left arm iv site edematous, arm elevated, no color change noted, we will monitor closely, IR consulted for new PICC placement. IR called to verify PICC  placement time, they will call back when ready, MD aware that IV abx and albumin on hold until new access is obtained.

## 2021-03-30 NOTE — Progress Notes (Signed)
Daily Progress Note   Patient Name: Chad Russell       Date: 03/30/2021 DOB: [birth year]  Age: 88 y.o. MRN#: [id number] Attending Physician: Dwyane Dee, MD Primary Care Physician: Garwin Brothers, MD Admit Date: 04/21/2021  Reason for Consultation/Follow-up: Establishing goals of care  Subjective: Patient appears weak and debilitated, appears deconditioned, resting in bed, has NG tube.  Wife present at bedside, spent some time discussing with her about CODE STATUS and broad goals of care as well as reviewing the patient's life thus far, see below.  Length of Stay: 5  Current Medications: Scheduled Meds:   carbidopa-levodopa  1 tablet Oral QID   Chlorhexidine Gluconate Cloth  6 each Topical Q0600   enoxaparin (LOVENOX) injection  75 mg Subcutaneous Q12H   feeding supplement (PROSource TF)  45 mL Per Tube BID   free water  150 mL Per Tube Q6H   Gerhardt's butt cream   Topical Daily   lactulose  10 g Oral TID   lisinopril  10 mg Oral Daily   methylPREDNISolone (SOLU-MEDROL) injection  40 mg Intravenous BID   mupirocin ointment  1 application Nasal BID   nutrition supplement (JUVEN)  1 packet Oral BID BM   polyethylene glycol  17 g Oral BID   senna-docusate  2 tablet Oral BID   sodium phosphate  1 enema Rectal Once   timolol  1 drop Both Eyes QHS    Continuous Infusions:  albumin human 25 g (03/30/21 0605)   ceFEPime (MAXIPIME) IV 2 g (03/30/21 0529)   feeding supplement (OSMOLITE 1.5 CAL) 1,000 mL (03/30/21 0739)   metronidazole Stopped (03/30/21 0242)   sodium phosphate  Dextrose 5% IVPB     vancomycin 1,500 mg (03/29/21 1521)    PRN Meds: acetaminophen, atropine, bisacodyl, HYDROcodone-acetaminophen, ondansetron **OR** ondansetron (ZOFRAN) IV  Physical Exam          Patient appears pale and weak Patient appears chronically ill Has NG tube Has coarse breath sounds Does not not have edema Has muscle wasting Does not awaken or respond  Vital Signs: BP (!) 153/68 (BP Location: Right Arm)    Pulse (!) 42    Temp 97.9 F (36.6 C) (Oral)    Resp 12    Ht 6' 1"  (1.854 m)    Wt 74.8 kg  SpO2 100%    BMI 21.77 kg/m  SpO2: SpO2: 100 % O2 Device: O2 Device: Nasal Cannula O2 Flow Rate: O2 Flow Rate (L/min): 4 L/min  Intake/output summary:  Intake/Output Summary (Last 24 hours) at 03/30/2021 1228 Last data filed at 03/30/2021 0900 Gross per 24 hour  Intake 2474.24 ml  Output 3225 ml  Net -750.76 ml   LBM: Last BM Date: 03/29/21 Baseline Weight: Weight: 74.8 kg Most recent weight: Weight: 74.8 kg       Palliative Assessment/Data:      Patient Active Problem List   Diagnosis Date Noted   Dysphagia 03/27/2021   Protein-calorie malnutrition, severe 03/27/2021   Lung nodule 03/26/2021   Constipation 04/17/2021   Sacral decubitus ulcer 04/01/2021   Acute respiratory failure with hypoxia ([place]) 04/17/2021   COVID-19 virus infection 03/26/2021   Pressure injury of skin 02/10/2021   Generalized weakness 02/07/2021   Dehydration 02/07/2021   Acute prerenal azotemia 02/07/2021   Parkinson disease ([place])    Protein calorie malnutrition ([place])    Multifocal pneumonia 02/06/2021   UTI (urinary tract infection) 10/14/2020   Bradycardia 10/14/2020   Educated about COVID-19 virus infection 11/29/2019   Exudative age-related macular degeneration of left eye with active choroidal neovascularization ([place]) 10/02/2019   Central retinal vein occlusion with macular edema of right eye 06/20/2019   Central retinal vein occlusion with neovascularization of right eye 06/20/2019   Ocular ischemic syndrome 06/20/2019   Retinal hemorrhage of left eye 06/20/2019   Retinal hemorrhage of right eye 06/20/2019   AKI (acute kidney injury) ([place]) 12/15/2018   Hematoma of  scalp    Heart block    Chest pain 05/04/2018   Fracture of femoral neck, left ([place]) 04/19/2017   MGUS (monoclonal gammopathy of unknown significance) 01/15/2017   BPH with obstruction/lower urinary tract symptoms 09/07/2016   Goals of care, counseling/discussion 08/24/2016   Urinary retention 05/13/2016   Balance problem 10/31/2013   OSA (obstructive sleep apnea) 10/31/2013   BPH (benign prostatic hyperplasia) 08/28/2012   Atrial fibrillation ([place]) 06/08/2012   Hyperplasia of prostate with lower urinary tract symptoms (LUTS) 04/28/2012   Moderate malnutrition ([place]) 04/28/2012   Infection of urinary tract - recurrent 04/26/2012   NSTEMI (non-ST elevated myocardial infarction) ([place]) 03/15/2011   Complete heart block ([place]) 03/15/2011   HTN (hypertension) 03/15/2011    Palliative Care Assessment & Plan   Patient Profile:    Assessment: Multifocal pneumonia present on admission COVID-19 virus infection present on admission Acute hypoxic respiratory failure Sacral decubitus ulcer present on admission History of lung nodule History of Parkinson's disease. Patient with recent admission in December 2022 for pneumonia, progressive functional decline since October 22 from mobility standpoint, was recently at [facility] and was noted to have been bedbound, found to have stage IV pressure sacral pressure ulcer with a difficulty in healing.  Recommendations/Plan: Family meeting: I met with the patient's wife at bedside.  She states that the patient is not doing well today.  He now has an NG tube and a feeding trial is underway.  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  They do not have any children.  They have been married to each other for over 2 years. Discussed gently but compassionately with the patient's wife about patient's current condition and about her  fears that the patient continues to decline despite of current interventions.  Hence,  part of CODE STATUS discussions and fully discussed about what happens inside resuscitative attempt.  Discussed about CPR, defibrillation if indicated, use of ACLS medications, intubation and chemical ventilation.  Patient's wife asks "why the paddles cannot be applied and why cannot he be shocked just to see if this will bring him back before doing CPR.  To the best of my ability, reviewed with her about resuscitation algorithm.  Patient's wife stated that she knows of a friend who had a defibrillator machine at home and who shocked her husband at home with a successful outcome.  We again discussed about differences between full code versus DNR/DNI.  Patient's wife becomes very tearful but told me on several occasions that she would not want the patient to undergo CPR or end up on machines.  She stated," Gerald Stabs would not want to live that way."  All opportunities were given for asking questions and for clarification.  CODE STATUS will now be revised to DNR/DNI. Broad goals of care discussions were also undertaken.  Patient's wife remains hopeful that his mental status will improve that his nutrition will improve and that the antibiotics will work.  Best case scenario/best outcome in her mind is for the patient to get better to the point that he can come home and she can continue caring for him.  She is aware of the several challenges that are head of the patient.  She wishes to remain cautiously optimistic.  She becomes tearful on multiple occasions.  Advised self-care, taking breaks, taking her medications and meals on time.  Offered active listening, empathic presence, compassionate care and other therapeutic techniques. Patient's wife remains hopeful that we can achieve further documentation from [facility] and that she will be able to have his attorneys come to the hospital to complete living will and other  important documents. Palliative team will continue to follow.     Code Status:    Code Status Orders  (From admission, onward)           Start     Ordered   04/03/2021 2126  Full code  Continuous        04/03/2021 2126           Code Status History     Date Active Date Inactive Code Status Order ID Comments User Context   02/06/2021 2006 02/14/2021 0624 Full Code [id number]  Rhetta Mura, DO ED   10/14/2020 2134 10/21/2020 1634 Full Code [id number]  Shela Leff, MD ED   12/14/2018 1947 12/26/2018 2232 Full Code [id number]  Orene Desanctis, DO ED   04/19/2017 2321 04/23/2017 0035 Full Code [id number]  Marchia Bond, MD Inpatient   01/17/2017 1142 01/17/2017 1904 Full Code [id number]  Angelia Mould, MD Inpatient   09/07/2016 1207 09/09/2016 1657 Full Code [id number]  Irine Seal, MD Inpatient   04/26/2012 2134 04/29/2012 1720 Full Code [id number]  Gerda Diss, DO Inpatient   03/15/2011 2127 03/17/2011 1522 Full Code [id number]  Bamimore, Earney Navy, MD ED       Prognosis:  Guarded   Discharge Planning: To Be Determined  Care plan was discussed with  wife at bedside.  Dr Sabino Gasser Nursing staff on secure chat.   Thank you for allowing the Palliative Medicine Team to assist in the care of this patient.   Time In: 11.30 Time Out: 12.30 Total Time 60 Prolonged Time Billed  no       Greater than 50%  of this time was spent  counseling and coordinating care related to the above assessment and plan.  Loistine Chance, MD  Please contact Palliative Medicine Team phone at [phone] for questions and concerns.

## 2021-03-30 NOTE — Progress Notes (Signed)
Progress Note   Patient: Chad Russell TIR:[id number] DOB: [birth year] DOA: 04/17/2021     5 DOS: the patient was seen and examined on 03/30/2021   Brief hospital course: [name] is a 88  yo male with PMH PAF, BPH, CAD, CHB, HTN, Parkinson's with dementia, OSA, arthritis who presented to the ER with worsening mentation.  He was recently admitted in December 2022 for PNA (negative covid at that time) due to presumed aspiration.  He has been less mobile since October 2022 with significant progressive decline from a mobility standpoint.  He has been residing at [facility] since discharge on 02/13/21.  He has been bedbound for several weeks since that time as well.  On workup he was reported to have increased confusion compared to baseline and associated fever.  He was found to be covid positive on workup and was started on Remdesivir. He also has a stage 4 sacral pressure ulcer that has had difficulty healing.   Assessment and Plan: * Multifocal pneumonia- (present on admission) - Differential includes ongoing aspiration from underlying advanced Parkinson's disease/dementia versus contribution from COVID-pneumonia - given MRSA positive swab, will change abx to vanc, cefepime, flagyl for now. Complete 7 day course and stop - tremendous difficulty with IV access; PICC unable to be placed by VAT team as well; have asked IR to attempt placement  -Follow-up cultures: Negative blood cx, final  COVID-19 virus infection-resolved as of 03/30/2021, (present on admission) - continue remdesivir x 5 days (completed on 2/5) - CT value 33 on admission  - fax received from [facility] that his covid test was positive on 03/01/21 and he completed isolation as well; will d/c isolation at this time   Acute respiratory failure with hypoxia ([place])- (present on admission) - New O2 requirement in setting of PNA + Fever + COVID positive. - continue steroids - wean O2 as able   Sacral  decubitus ulcer- (present on admission) - No cellulitis or osteomyelitis on CT - follow rec's from wound care consult - low suspicion this is going to heal well with his bed bound state and poor functional status/physical reserve; I've communicated this with his wife, but she is disproportionately hopeful he will have a tremendous recovery in general  Goals of care, counseling/discussion - Have discussed his case with his wife at length since admission. There is certainly a component of denial unfortunately, but also just not being fully aware of the gravity of his disease state and his overall QOL and functional status. - I have continued to emphasize his overall poor status etc as noted. - NGT placed 2/4 with IR. TF started 2/4 - prognosis remains guarded at best with easy potential for worsening  - palliative care able to talk with wife at length more on 2/6 and wife was okay with status change to DNR/DNI but continuing ongoing medical treatment; confirmed by nursing as well   Lung nodule - Multiple lung nodules noted on CT chest (RUL, RLL, LUL, LLL). Also has R>L pleural effusion.  Differential for effusion would include parapneumonic versus malignant -Regardless, given his extremely frail state, poor functional status, underlying dementia he would be a extremely poor treatment candidate - updated wife on lung nodules on 2/4; she says she "already knew about them" and if "he never got cancer in the past he probably doesn't have it now" - per radiology fluid collection in right pleura not enough for thoracentesis at this time  Constipation- (present on admission) - s/p  enema in ER - follow up response as may need disimpaction and/or more laxatives  Parkinson disease ([place])- (present on admission) - Advanced PD with dementia, although wife states he is more lucid than chart review implies - significant cerebral atrophy appreciated on CT head as well - continue sinemet; wife states he "locks  up" often and is stiff. His sinemet is BID dosing, therefore I will adjust to QID - he does note feeling improvement in his tremors since sinemet was adjusted  Dysphagia - Underwent SLP eval on 03/26/2021 with overt signs of dysphagia and risk for aspiration. - recommended to remain NPO; ice chips are okay - unsuccessful attempt for cortrak placement with nursing on 2/3 despite multiple attempts - IR able to place NGT on 03/28/21 - reevaluated by SLP on 2/6, still not safe for PO; we'll continue NPO with allowing ice chips and continue meds via NGT or IV - continue TF for now   Acute metabolic encephalopathy-resolved as of 03/27/2021, (present on admission) Delirium due to PNA / COVID on top of chronic parkinson's dementia.  Atrial fibrillation ([place])- (present on admission) Holding Xarelto while NPO. - continue lovenox for now until more [place] discussions   HTN (hypertension)- (present on admission) - mild elevation on admission - use labetalol or hydralazine for now as well  - resume home lisinopril       Subjective:  Still having episodes of bradycardia though asymptomatic.  Updated wife in the hallway today.  Patient states he feels a little worse today.  Not sure how much he truly understands when given a medical update. Palliative care was able to talk with his wife more this morning and she agreed to changing his status to DNR/DNI.  This was confirmed by his nurse as well. Thus, I have changed code status in the computer to reflect this.   Wife also wants his lawyer and witness to be able to visit today to sign the will, POA, and some other documents.    Physical Exam: Vitals:   03/30/21 0021 03/30/21 0350 03/30/21 0500 03/30/21 0930  BP: 118/68 138/75  (!) 153/68  Pulse: (!) 28 (!) 30  (!) 42  Resp:  12    Temp: 97.8 F (36.6 C) 97.7 F (36.5 C)  97.9 F (36.6 C)  TempSrc: Oral Oral  Oral  SpO2:  100%  100%  Weight:   74.8 kg   Height:      Physical  Exam Constitutional:      Comments: Chronically ill-appearing elderly gentleman lying in bed in no distress  HENT:     Head: Normocephalic and atraumatic.     Mouth/Throat:     Mouth: Mucous membranes are dry.  Eyes:     Extraocular Movements: Extraocular movements intact.  Cardiovascular:     Rate and Rhythm: Normal rate and regular rhythm.  Pulmonary:     Breath sounds: Rhonchi and rales present. No wheezing.  Abdominal:     General: Bowel sounds are normal. There is no distension.     Palpations: Abdomen is soft.     Tenderness: There is no abdominal tenderness.  Musculoskeletal:        General: No swelling.     Cervical back: Normal range of motion and neck supple.  Skin:    General: Skin is warm.  Neurological:     Comments: Chronic underlying dementia appreciated.  Follows commands and moves all 4 extremities albeit extremely weak   DVT ppx: Lovenox   Data Reviewed:  I have Reviewed nursing notes, Vitals, and Lab results since pt's last encounter. Pertinent lab results Na 131, creat 0.52, BUN 22, Alb 3.4, ALT 35 I have independently visualized and interpreted imaging CT chest which showed multiple lung nodules, R>L effusion. I have reviewed the last note from  staff over past 24 hours,  I have discussed pt's care plan and test results with wife and nursing staff.  Family Communication: Wife     Code Status: DNR   Disposition: Status is: Inpatient Remains inpatient appropriate because: ongoing covid treatment, pna treatment, and failure to thrive     Planned Discharge Destination: Skilled nursing facility  DVT ppx: Lovenox       Author: Dwyane Dee, MD 03/30/2021 2:26 PM  For on call review [email or web address].

## 2021-03-31 ENCOUNTER — Inpatient Hospital Stay (HOSPITAL_COMMUNITY): Payer: Medicare HMO

## 2021-03-31 DIAGNOSIS — J189 Pneumonia, unspecified organism: Secondary | ICD-10-CM | POA: Diagnosis not present

## 2021-03-31 DIAGNOSIS — U071 COVID-19: Secondary | ICD-10-CM | POA: Diagnosis not present

## 2021-03-31 DIAGNOSIS — Z7189 Other specified counseling: Secondary | ICD-10-CM | POA: Diagnosis not present

## 2021-03-31 DIAGNOSIS — J9601 Acute respiratory failure with hypoxia: Secondary | ICD-10-CM | POA: Diagnosis not present

## 2021-03-31 DIAGNOSIS — R1310 Dysphagia, unspecified: Secondary | ICD-10-CM | POA: Diagnosis not present

## 2021-03-31 HISTORY — PX: IR US GUIDE VASC ACCESS LEFT: IMG2389

## 2021-03-31 LAB — GLUCOSE, CAPILLARY
Glucose-Capillary: 174 mg/dL — ABNORMAL HIGH (ref 70–99)
Glucose-Capillary: 136 mg/dL — ABNORMAL HIGH (ref 70–99)
Glucose-Capillary: 99 mg/dL (ref 70–99)
Glucose-Capillary: 139 mg/dL — ABNORMAL HIGH (ref 70–99)
Glucose-Capillary: 144 mg/dL — ABNORMAL HIGH (ref 70–99)

## 2021-03-31 LAB — COMPREHENSIVE METABOLIC PANEL
ALT: 42 U/L (ref 0–44)
AST: 57 U/L — ABNORMAL HIGH (ref 15–41)
Albumin: 3 g/dL — ABNORMAL LOW (ref 3.5–5.0)
Alkaline Phosphatase: 66 U/L (ref 38–126)
Anion gap: 5 (ref 5–15)
BUN: 23 mg/dL (ref 8–23)
CO2: 30 mmol/L (ref 22–32)
Calcium: 8.7 mg/dL — ABNORMAL LOW (ref 8.9–10.3)
Chloride: 97 mmol/L — ABNORMAL LOW (ref 98–111)
Creatinine, Ser: 0.6 mg/dL — ABNORMAL LOW (ref 0.61–1.24)
GFR, Estimated: >60 mL/min (ref >60)
Glucose, Bld: 134 mg/dL — ABNORMAL HIGH (ref 70–99)
Potassium: 3.9 mmol/L (ref 3.5–5.1)
Sodium: 132 mmol/L — ABNORMAL LOW (ref 135–145)
Total Bilirubin: 0.6 mg/dL (ref 0.3–1.2)
Total Protein: 5.4 g/dL — ABNORMAL LOW (ref 6.5–8.1)

## 2021-03-31 LAB — CBC WITH DIFFERENTIAL/PLATELET
Abs Immature Granulocytes: 0.19 10*3/uL — ABNORMAL HIGH (ref 0.00–0.07)
Basophils Absolute: 0 10*3/uL (ref 0.0–0.1)
Basophils Relative: 0 %
Eosinophils Absolute: 0.1 10*3/uL (ref 0.0–0.5)
Eosinophils Relative: 1 %
HCT: 30.4 % — ABNORMAL LOW (ref 39.0–52.0)
Hemoglobin: 10 g/dL — ABNORMAL LOW (ref 13.0–17.0)
Immature Granulocytes: 2 %
Lymphocytes Relative: 13 %
Lymphs Abs: 1.2 10*3/uL (ref 0.7–4.0)
MCH: 29.9 pg (ref 26.0–34.0)
MCHC: 32.9 g/dL (ref 30.0–36.0)
MCV: 90.7 fL (ref 80.0–100.0)
Monocytes Absolute: 0.7 10*3/uL (ref 0.1–1.0)
Monocytes Relative: 7 %
Neutro Abs: 7.2 10*3/uL (ref 1.7–7.7)
Neutrophils Relative %: 77 %
Platelets: 339 10*3/uL (ref 150–400)
RBC: 3.35 MIL/uL — ABNORMAL LOW (ref 4.22–5.81)
RDW: 17.3 % — ABNORMAL HIGH (ref 11.5–15.5)
WBC: 9.5 10*3/uL (ref 4.0–10.5)
nRBC: 0 % (ref 0.0–0.2)

## 2021-03-31 LAB — MAGNESIUM
Magnesium: 2.1 mg/dL (ref 1.7–2.4)
Magnesium: 2.2 mg/dL (ref 1.7–2.4)

## 2021-03-31 LAB — PHOSPHORUS
Phosphorus: 1.8 mg/dL — ABNORMAL LOW (ref 2.5–4.6)
Phosphorus: 4.4 mg/dL (ref 2.5–4.6)

## 2021-03-31 MED ORDER — SODIUM PHOSPHATES 45 MMOLE/15ML IV SOLN
45.0000 mmol | Freq: Once | INTRAVENOUS | Status: AC
  Administered 2021-03-31: 45 mmol via INTRAVENOUS
  Filled 2021-03-31: qty 15

## 2021-03-31 MED ORDER — LIDOCAINE HCL (PF) 1 % IJ SOLN
INTRAMUSCULAR | Status: DC | PRN
  Administered 2021-03-31: 5 mL via SUBCUTANEOUS

## 2021-03-31 MED ORDER — LIDOCAINE HCL 1 % IJ SOLN
INTRAMUSCULAR | Status: AC
  Filled 2021-03-31: qty 20

## 2021-03-31 MED ORDER — OSMOLITE 1.5 CAL PO LIQD
1000.0000 mL | ORAL | Status: DC
  Administered 2021-03-31 – 2021-04-05 (×4): 1000 mL
  Filled 2021-03-31 (×10): qty 1000

## 2021-03-31 NOTE — Plan of Care (Signed)
  Problem: Clinical Measurements: Goal: Diagnostic test results will improve Outcome: Progressing   Problem: Clinical Measurements: Goal: Respiratory complications will improve Outcome: Progressing   

## 2021-03-31 NOTE — Progress Notes (Signed)
Daily Progress Note   Patient Name: Chad Russell       Date: 03/31/2021 DOB: 1926/07/05  Age: 88 y.o. MRN#: [id number] Attending Physician: Dwyane Dee, MD Primary Care Physician: Garwin Brothers, MD Admit Date: 04/18/2021  Reason for Consultation/Follow-up: Establishing goals of care  Subjective: I met today with Chad Russell and his wife.  He was lying in bed with NG in place and eyes closed but he did respond when asked simple questions.  His wife was present at the bedside.  Initially, she was very concerned as she believed that he was unable to have PICC placed this morning.  She had many questions regarding care plan if that was the case.    I confirmed with his RN that PICC was placed and functioning.  Shared this information with his wife and she was much less anxious at that point.  She reports that they remain invested in plan to see how he continues to do with continuation of current support.  Length of Stay: 6  Current Medications: Scheduled Meds:   carbidopa-levodopa  1 tablet Oral QID   enoxaparin (LOVENOX) injection  75 mg Subcutaneous Q12H   feeding supplement (PROSource TF)  45 mL Per Tube BID   free water  150 mL Per Tube Q6H   Gerhardt's butt cream   Topical Daily   lisinopril  10 mg Oral Daily   methylPREDNISolone (SOLU-MEDROL) injection  40 mg Intravenous BID   nutrition supplement (JUVEN)  1 packet Oral BID BM   polyethylene glycol  17 g Oral BID   senna-docusate  2 tablet Oral BID   sodium phosphate  1 enema Rectal Once   timolol  1 drop Both Eyes QHS    Continuous Infusions:  ceFEPime (MAXIPIME) IV 2 g (03/31/21 2152)   feeding supplement (OSMOLITE 1.5 CAL) 1,000 mL (03/31/21 1459)   metronidazole 500 mg (03/31/21 1356)   vancomycin 1,500 mg (03/31/21 1529)     PRN Meds: acetaminophen, atropine, bisacodyl, HYDROcodone-acetaminophen, lidocaine (PF), ondansetron **OR** ondansetron (ZOFRAN) IV  Physical Exam         Patient appears pale and weak Patient appears chronically ill Has NG tube Has coarse breath sounds Does not not have edema Has muscle wasting Does not open eyes but answers a few "yes" or "no" questions  Vital Signs:  BP (!) 152/80 (BP Location: Right Arm)    Pulse (!) 51    Temp 98 F (36.7 C) (Axillary)    Resp 16    Ht 6' 1"  (1.854 m)    Wt 79.4 kg    SpO2 100%    BMI 23.09 kg/m  SpO2: SpO2: 100 % O2 Device: O2 Device: Nasal Cannula O2 Flow Rate: O2 Flow Rate (L/min): 2 L/min  Intake/output summary:  Intake/Output Summary (Last 24 hours) at 03/31/2021 2223 Last data filed at 03/31/2021 1815 Gross per 24 hour  Intake 140 ml  Output 1900 ml  Net -1760 ml    LBM: Last BM Date: 04/19/2021 Baseline Weight: Weight: 74.8 kg Most recent weight: Weight: 79.4 kg       Palliative Assessment/Data:      Patient Active Problem List   Diagnosis Date Noted   Dysphagia 03/27/2021   Protein-calorie malnutrition, severe 03/27/2021   Lung nodule 03/26/2021   Constipation 03/30/2021   Sacral decubitus ulcer 04/10/2021   Acute respiratory failure with hypoxia ([place]) 04/21/2021   Pressure injury of skin 02/10/2021   Generalized weakness 02/07/2021   Dehydration 02/07/2021   Acute prerenal azotemia 02/07/2021   Parkinson disease ([place])    Protein calorie malnutrition ([place])    Multifocal pneumonia 02/06/2021   UTI (urinary tract infection) 10/14/2020   Bradycardia 10/14/2020   Educated about COVID-19 virus infection 11/29/2019   Exudative age-related macular degeneration of left eye with active choroidal neovascularization ([place]) 10/02/2019   Central retinal vein occlusion with macular edema of right eye 06/20/2019   Central retinal vein occlusion with neovascularization of right eye 06/20/2019   Ocular ischemic syndrome  06/20/2019   Retinal hemorrhage of left eye 06/20/2019   Retinal hemorrhage of right eye 06/20/2019   AKI (acute kidney injury) ([place]) 12/15/2018   Hematoma of scalp    Heart block    Chest pain 05/04/2018   Fracture of femoral neck, left ([place]) 04/19/2017   MGUS (monoclonal gammopathy of unknown significance) 01/15/2017   BPH with obstruction/lower urinary tract symptoms 09/07/2016   Goals of care, counseling/discussion 08/24/2016   Urinary retention 05/13/2016   Balance problem 10/31/2013   OSA (obstructive sleep apnea) 10/31/2013   BPH (benign prostatic hyperplasia) 08/28/2012   Atrial fibrillation ([place]) 06/08/2012   Hyperplasia of prostate with lower urinary tract symptoms (LUTS) 04/28/2012   Moderate malnutrition ([place]) 04/28/2012   Infection of urinary tract - recurrent 04/26/2012   NSTEMI (non-ST elevated myocardial infarction) ([place]) 03/15/2011   Complete heart block ([place]) 03/15/2011   HTN (hypertension) 03/15/2011    Palliative Care Assessment & Plan   Patient Profile:    Assessment: Multifocal pneumonia present on admission COVID-19 virus infection present on admission Acute hypoxic respiratory failure Sacral decubitus ulcer present on admission History of lung nodule History of Parkinson's disease. Patient with recent admission in December 2022 for pneumonia, progressive functional decline since October 22 from mobility standpoint, was recently at [place] [facility] care and was noted to have been bedbound, found to have stage IV pressure sacral pressure ulcer with a difficulty in healing.  Recommendations/Plan: DNR/DNI Otherwise, Chad Russell and his wife remain invested in plan to continue current care.  Will continue to follow and progress conversation based upon his clinical course over the next few days.  One major concern will be his long term nutrition and hydration.  His wife is focused on ensuring he receives antibiotics today and not really in place to progress  conversation regarding longer term plan  today.     Code Status:    Code Status Orders  (From admission, onward)           Start     Ordered   04/17/2021 2126  Full code  Continuous        04/02/2021 2126           Code Status History     Date Active Date Inactive Code Status Order ID Comments User Context   02/06/2021 2006 02/14/2021 0624 Full Code [id number]  Rhetta Mura, DO ED   10/14/2020 2134 10/21/2020 1634 Full Code [id number]  Shela Leff, MD ED   12/14/2018 1947 12/26/2018 2232 Full Code [id number]  Orene Desanctis, DO ED   04/19/2017 2321 04/23/2017 0035 Full Code [id number]  Marchia Bond, MD Inpatient   01/17/2017 1142 01/17/2017 1904 Full Code [id number]  Angelia Mould, MD Inpatient   09/07/2016 1207 09/09/2016 1657 Full Code [id number]  Irine Seal, MD Inpatient   04/26/2012 2134 04/29/2012 1720 Full Code [id number]  Gerda Diss, DO Inpatient   03/15/2011 2127 03/17/2011 1522 Full Code [id number]  Bamimore, Earney Navy, MD ED       Prognosis:  Guarded   Discharge Planning: To Be Determined  Care plan was discussed with  wife at bedside.   Micheline Rough, MD  Please contact Palliative Medicine Team phone at [phone] for questions and concerns.

## 2021-03-31 NOTE — Progress Notes (Signed)
Nutrition Follow-up  DOCUMENTATION CODES:   Severe malnutrition in context of chronic illness  INTERVENTION:  - continue TF at goal regimen of Osmolite 1.5 @ 55 ml/hr with 45 ml Prosource TF BID, 1 packet Juven BID (mixed with 237 ml water each time), and 150 ml free water every 6 hours.  - this regimen provides 2250 kcal, 110 grams protein, and 2080 ml free water.   - check serum K, Mg, and Phos today at 1700 and 2/8 at 0500. MD to replete if needed.   NUTRITION DIAGNOSIS:   Severe Malnutrition related to chronic illness (Parkinson's disease) as evidenced by severe fat depletion, severe muscle depletion. -ongoing  GOAL:   Patient will meet greater than or equal to 90% of their needs -met with TF regimen at goal  MONITOR:   TF tolerance, Labs, Weight trends, Skin   ASSESSMENT:   88  yo male with medical history listed in the chart of PAF, BPH, CAD, CHB, HTN, Parkinson's with dementia, OSA, and arthritis. He presented to the ED with worsening mentation. He was admitted in 01/2021 with PNA which at that time was presumed to be d/t aspiration. Notes indicate that patient has been less mobile since 11/2020. Patient has been at [facility] since 02/13/2021. He has been bedbound for several weeks. In the ED he was found to be COVID-19 positive. Admission dx of multifocal PNA.  Patient laying in bed with wife at bedside. Earlier this afternoon he had double lumen PICC placed in L brachial and RN reports she had given patient pain medication.  HOB noted to be at 15 degrees so RD raised head of bed to 32 degrees. HOB to be >/= 30 degrees when patient receiving tube feeding.   Small bore NGT in L nare and patient receiving Osmolite 1.5 at 45 ml/hr with 45 ml Prosource TF BID, 1 packet Juven BID, and 30 ml free water every 4 hours.   This regimen is providing 1890 kcal, 95 grams protein, and 906 ml free water.  Alerted RN that free water flush order is for 150 ml water every 6  hours.   RN reports that Osmolite 1.5 running at 45 ml/hr since prior to day shift. RD asked RN to increase rate to goal rate of 55 ml/hr as RN reports patient has been tolerating TF without any noted issues.   Hypophosphatemia noted with repletion provided earlier this afternoon; no lab draw following repletion.   Weight today is +10 compared to weights recorded on 2/1 and 2/6. Non-pitting edema to BUE documented in the edema section of flow sheet.    Labs reviewed; CBGs: 174, 136, 99 mg/dl, Na: 132 mmol/l, Cl: 97 mmol/l, creatinine: 0.6 mg/dl, Ca: 8.7 mg/dl, Phos: 1.8 mg/dl. Mg and K WDL. Vitamin B12 checked on 2/3 and is WDL at 794. Folate checked on 2/3 and was WDL at 16.9.  Medications reviewed; 40 mg solu-medrol BID, 17 g miralax BID, 45 mmol IV NaPhos x1 run today at 1200.   Diet Order:   Diet Order             Diet NPO time specified Except for: Ice Chips, Sips with Meds  Diet effective now                   EDUCATION NEEDS:   Education needs have been addressed  Skin:  Skin Assessment: Skin Integrity Issues: Skin Integrity Issues:: Stage IV Stage IV: sacrum (4.5 cm x 4.5 cm x 1 cm)  Last BM:  2/5 (type 3, small amount)  Height:   Ht Readings from Last 1 Encounters:  03/28/21 6' 1"  (1.854 m)    Weight:   Wt Readings from Last 1 Encounters:  03/31/21 79.4 kg     BMI:  Body mass index is 23.09 kg/m.   Estimated Nutritional Needs:  Kcal:  2200-2400 kcal Protein:  110-120 grams Fluid:  >/= 2.5 L/day     Jarome Matin, MS, RD, LDN Inpatient Clinical Dietitian RD pager # available in [place]  After hours/weekend pager # available in [phone]

## 2021-03-31 NOTE — Progress Notes (Signed)
Progress Note   Patient: Chad Russell WSF:[id number] DOB: 20-May-1926 DOA: 03/29/2021     6 DOS: the patient was seen and examined on 03/31/2021   Brief hospital course: [name] is a 88  yo male with PMH PAF, BPH, CAD, CHB, HTN, Parkinson's with dementia, OSA, arthritis who presented to the ER with worsening mentation.  He was recently admitted in December 2022 for PNA (negative covid at that time) due to presumed aspiration.  He has been less mobile since October 2022 with significant progressive decline from a mobility standpoint.  He has been residing at [facility] since discharge on 02/13/21.  He has been bedbound for several weeks since that time as well.  On workup he was reported to have increased confusion compared to baseline and associated fever.  He was found to be covid positive on workup and was started on Remdesivir. He also has a stage 4 sacral pressure ulcer that has had difficulty healing.   Assessment and Plan: * Multifocal pneumonia- (present on admission) - Differential includes ongoing aspiration from underlying advanced Parkinson's disease/dementia versus contribution from COVID-pneumonia - given MRSA positive swab, will change abx to vanc, cefepime, flagyl for now. Complete 7 day course and stop - tremendous difficulty with IV access; PICC unable to be placed by VAT team as well; PICC placed by IR on 03/31/21 to LUE -Follow-up cultures: Negative blood cx, final  COVID-19 virus infection-resolved as of 03/30/2021, (present on admission) - continue remdesivir x 5 days (completed on 2/5) - CT value 33 on admission  - fax received from [facility] that his covid test was positive on 03/01/21 and he completed isolation as well; will d/c isolation at this time   Acute respiratory failure with hypoxia ([place])- (present on admission) - New O2 requirement in setting of PNA + Fever + COVID positive. - continue steroids - wean O2 as able   Sacral  decubitus ulcer- (present on admission) - No cellulitis or osteomyelitis on CT - follow rec's from wound care consult - low suspicion this is going to heal well with his bed bound state and poor functional status/physical reserve; I've communicated this with his wife, but she is disproportionately hopeful he will have a tremendous recovery in general  Goals of care, counseling/discussion - Have discussed his case with his wife at length since admission. There is certainly a component of denial unfortunately, but also just not being fully aware of the gravity of his disease state and his overall QOL and functional status. - I have continued to emphasize his overall poor status etc as noted. - NGT placed 2/4 with IR. TF started 2/4 - prognosis remains guarded at best with easy potential for worsening  - palliative care able to talk with wife at length more on 2/6 and wife was okay with status change to DNR/DNI but continuing ongoing medical treatment; confirmed by nursing as well  - my medical opinion is that IF he does survive hospitalization he will discharge to long term care vs residential hospice vs needing to remain in hospital on comfort care (however, wife has not elected for any of that) but I do not see him improving/thriving enough to accomplish improving enough for even short term rehab  Lung nodule - Multiple lung nodules noted on CT chest (RUL, RLL, LUL, LLL). Also has R>L pleural effusion.  Differential for effusion would include parapneumonic versus malignant -Regardless, given his extremely frail state, poor functional status, underlying dementia he would be  a extremely poor treatment candidate - updated wife on lung nodules on 2/4; she says she "already knew about them" and if "he never got cancer in the past he probably doesn't have it now" - per radiology fluid collection in right pleura not enough for thoracentesis at this time  Constipation- (present on admission) - s/p enema  in ER - follow up response as may need disimpaction and/or more laxatives  Parkinson disease ([place])- (present on admission) - Advanced PD with dementia, although wife states he is more lucid than chart review implies - significant cerebral atrophy appreciated on CT head as well - continue sinemet; wife states he "locks up" often and is stiff. His sinemet is BID dosing, therefore I will adjust to QID - he does note feeling improvement in his tremors since sinemet was adjusted  Dysphagia - Underwent SLP eval on 03/26/2021 with overt signs of dysphagia and risk for aspiration. - recommended to remain NPO; ice chips are okay - unsuccessful attempt for cortrak placement with nursing on 2/3 despite multiple attempts - IR able to place NGT on 03/28/21 - reevaluated by SLP on 2/6, still not safe for PO; we'll continue NPO with allowing ice chips and continue meds via NGT or IV - continue TF for now   Acute metabolic encephalopathy-resolved as of 03/27/2021, (present on admission) Delirium due to PNA / COVID on top of chronic parkinson's dementia.  Atrial fibrillation ([place])- (present on admission) Holding Xarelto while NPO. - continue lovenox for now until more [place] discussions   HTN (hypertension)- (present on admission) - mild elevation on admission - use labetalol or hydralazine for now as well  - resume home lisinopril       Subjective:  See my opinion under goals of care as well. He is laying in bed with eyes closed as usual but awakens fairly easily. He asks again "how am I doing" which I tell him his body is very sick and weak and I'm worried he's not going to recover and he remains dependent on tube feeding. He says "okay", but I'm not sure how much he processes.  Wife not present today but I wouldn't tell her anything different today than I haven't the past few days.  Prognosis remains poor.    Physical Exam: Vitals:   03/31/21 0500 03/31/21 0613 03/31/21 0800 03/31/21 1153  BP:   109/70  126/74  Pulse:  (!) 105  (!) 33  Resp:  14 14 16   Temp:  98.2 F (36.8 C)  97.7 F (36.5 C)  TempSrc:    Oral  SpO2:  100%  96%  Weight: 79.4 kg     Height:      Physical Exam Constitutional:      Comments: Chronically ill-appearing elderly gentleman lying in bed in no distress  HENT:     Head: Normocephalic and atraumatic.     Mouth/Throat:     Mouth: Mucous membranes are dry.  Eyes:     Extraocular Movements: Extraocular movements intact.  Cardiovascular:     Rate and Rhythm: Normal rate and regular rhythm.  Pulmonary:     Breath sounds: Rhonchi and rales present. No wheezing.  Abdominal:     General: Bowel sounds are normal. There is no distension.     Palpations: Abdomen is soft.     Tenderness: There is no abdominal tenderness.  Musculoskeletal:        General: No swelling.     Cervical back: Normal range of motion and neck  supple.     Comments: PICC in LUE  Skin:    General: Skin is warm.  Neurological:     Comments: Chronic underlying dementia appreciated.  Follows commands and moves all 4 extremities albeit extremely weak   DVT ppx: Lovenox   Data Reviewed:  I have Reviewed nursing notes, Vitals, and Lab results since pt's last encounter. Pertinent lab results Na 132, creat 0.6, BUN 22, Alb 3, ALT 42 I have independently visualized and interpreted imaging CT chest which showed multiple lung nodules, R>L effusion. I have reviewed the last note from  staff over past 24 hours,  I have discussed pt's care plan and test results with wife and nursing staff.  Family Communication: Wife     Code Status: DNR   Disposition: Status is: Inpatient Remains inpatient appropriate because: ongoing covid treatment, pna treatment, and failure to thrive     Planned Discharge Destination: Skilled nursing facility  DVT ppx: Lovenox      Author: Dwyane Dee, MD 03/31/2021 2:03 PM  For on call review [email or web address].

## 2021-03-31 NOTE — Procedures (Addendum)
Successful placement of dual lumen PICC line to brachial vein. Length 41 cm Tip at lower SVC/RA PICC capped No complications Ready for use.  EBL < 5 mL   Tyson Alias, AGNP 03/31/2021 9:22 AM

## 2021-03-31 NOTE — Progress Notes (Signed)
Pharmacy Antibiotic Note  Chad Russell is a 88 y.o. male admitted on 04/19/2021 with +COVID, possible aspiration pneumonia.  Pharmacy has been consulted for vancomycin dosing.  Today 03/31/21 SCr 0.6 WBC remain WNL Remains afebrile  Plan: Continue vancomycin 1500 mg iv q 24 hours  Daily SCr   Flagyl per MD  Cefepime per MD  Planned completion after 7 days per TRH   Height: 6\' 1"  (185.4 cm) Weight: 79.4 kg (175 lb) IBW/kg (Calculated) : 79.9  Temp (24hrs), Avg:98 F (36.7 C), Min:97.5 F (36.4 C), Max:98.3 F (36.8 C)  Recent Labs  Lab 04/12/2021 1749 04/03/2021 1823 03/27/21 0347 03/28/21 0420 03/28/21 0822 03/29/21 0411 03/30/21 0711 03/31/21 0411  WBC 9.7   < > 13.6* 11.5*  --  11.1* 8.4 9.5  CREATININE 0.76   < > 0.77 0.71 0.78 0.62 0.52* 0.60*  LATICACIDVEN 1.4  --   --   --   --   --   --   --    < > = values in this interval not displayed.     Estimated Creatinine Clearance: 66.2 mL/min (A) (by C-G formula based on SCr of 0.6 mg/dL (L)).    Allergies  Allergen Reactions   Doxycycline Other (See Comments)    Upset stomach   Keflex [Cephalexin] Other (See Comments)    Abdominal discomfort 04-19-17 Pt has tolerated orally; tolerated IV Rocephin    2/1 Vanc x1 2/1 Cefepime x1 2/1 Remdesivir >> (2/5) 2/1 Unasyn >> 2/3 2/3 Cefepime >> 2/3 Vanc >> 2/3 metronidazole >>  2/1 COVID positive; influenza neg 2/1 BCx: NGF 2/1 UCx: NGF 2/2 MRSA PCR: positive  Thank you for allowing pharmacy to be a part of this patients care.  Ulice Dash, PharmD 03/31/2021 9:14 AM

## 2021-03-31 NOTE — Telephone Encounter (Signed)
Caller Name [place] Phone Number [phone] Patient Name Chad Russell Patient DOB 02-13-1927 Call Type Message Only Information Provided Reason for Call Request for General Office Information Initial Comment Caller states she would like Doctor Carlota Raspberry to call her back. Additional Comment Caller states she wants to know what to do with care for patient after he is discharged from hospital. Disp. Time Disposition Final User 03/27/2021 5:55:40 PM General Information Provided Yes Olga Coaster Call Closed By: Olga Coaster Transaction Date/Time: 03/27/2021 5:52:46 PM (ET

## 2021-03-31 NOTE — Telephone Encounter (Signed)
Pt is insisting on call, I have recommended virtual for Hospitalization follow up.   Please advise

## 2021-04-01 ENCOUNTER — Inpatient Hospital Stay (HOSPITAL_COMMUNITY): Payer: Medicare HMO

## 2021-04-01 DIAGNOSIS — J189 Pneumonia, unspecified organism: Secondary | ICD-10-CM | POA: Diagnosis not present

## 2021-04-01 DIAGNOSIS — Z515 Encounter for palliative care: Secondary | ICD-10-CM | POA: Diagnosis not present

## 2021-04-01 DIAGNOSIS — I4811 Longstanding persistent atrial fibrillation: Secondary | ICD-10-CM | POA: Diagnosis not present

## 2021-04-01 DIAGNOSIS — G9341 Metabolic encephalopathy: Secondary | ICD-10-CM | POA: Diagnosis not present

## 2021-04-01 DIAGNOSIS — Z7189 Other specified counseling: Secondary | ICD-10-CM | POA: Diagnosis not present

## 2021-04-01 DIAGNOSIS — J90 Pleural effusion, not elsewhere classified: Secondary | ICD-10-CM | POA: Diagnosis not present

## 2021-04-01 DIAGNOSIS — J9601 Acute respiratory failure with hypoxia: Secondary | ICD-10-CM | POA: Diagnosis not present

## 2021-04-01 LAB — CBC WITH DIFFERENTIAL/PLATELET
Abs Immature Granulocytes: 0.21 10*3/uL — ABNORMAL HIGH (ref 0.00–0.07)
Basophils Absolute: 0 10*3/uL (ref 0.0–0.1)
Basophils Relative: 0 %
Eosinophils Absolute: 0 10*3/uL (ref 0.0–0.5)
Eosinophils Relative: 0 %
HCT: 30.5 % — ABNORMAL LOW (ref 39.0–52.0)
Hemoglobin: 10.2 g/dL — ABNORMAL LOW (ref 13.0–17.0)
Immature Granulocytes: 1 %
Lymphocytes Relative: 4 %
Lymphs Abs: 0.6 10*3/uL — ABNORMAL LOW (ref 0.7–4.0)
MCH: 30.1 pg (ref 26.0–34.0)
MCHC: 33.4 g/dL (ref 30.0–36.0)
MCV: 90 fL (ref 80.0–100.0)
Monocytes Absolute: 0.5 10*3/uL (ref 0.1–1.0)
Monocytes Relative: 3 %
Neutro Abs: 14.5 10*3/uL — ABNORMAL HIGH (ref 1.7–7.7)
Neutrophils Relative %: 92 %
Platelets: 304 10*3/uL (ref 150–400)
RBC: 3.39 MIL/uL — ABNORMAL LOW (ref 4.22–5.81)
RDW: 17.7 % — ABNORMAL HIGH (ref 11.5–15.5)
WBC: 15.9 10*3/uL — ABNORMAL HIGH (ref 4.0–10.5)
nRBC: 0 % (ref 0.0–0.2)

## 2021-04-01 LAB — COMPREHENSIVE METABOLIC PANEL
ALT: 45 U/L — ABNORMAL HIGH (ref 0–44)
AST: 49 U/L — ABNORMAL HIGH (ref 15–41)
Albumin: 2.8 g/dL — ABNORMAL LOW (ref 3.5–5.0)
Alkaline Phosphatase: 62 U/L (ref 38–126)
Anion gap: 5 (ref 5–15)
BUN: 27 mg/dL — ABNORMAL HIGH (ref 8–23)
CO2: 28 mmol/L (ref 22–32)
Calcium: 8.2 mg/dL — ABNORMAL LOW (ref 8.9–10.3)
Chloride: 98 mmol/L (ref 98–111)
Creatinine, Ser: 0.62 mg/dL (ref 0.61–1.24)
GFR, Estimated: >60 mL/min (ref >60)
Glucose, Bld: 142 mg/dL — ABNORMAL HIGH (ref 70–99)
Potassium: 4 mmol/L (ref 3.5–5.1)
Sodium: 131 mmol/L — ABNORMAL LOW (ref 135–145)
Total Bilirubin: 0.8 mg/dL (ref 0.3–1.2)
Total Protein: 5.3 g/dL — ABNORMAL LOW (ref 6.5–8.1)

## 2021-04-01 LAB — GLUCOSE, CAPILLARY
Glucose-Capillary: 114 mg/dL — ABNORMAL HIGH (ref 70–99)
Glucose-Capillary: 121 mg/dL — ABNORMAL HIGH (ref 70–99)
Glucose-Capillary: 125 mg/dL — ABNORMAL HIGH (ref 70–99)
Glucose-Capillary: 135 mg/dL — ABNORMAL HIGH (ref 70–99)
Glucose-Capillary: 139 mg/dL — ABNORMAL HIGH (ref 70–99)
Glucose-Capillary: 153 mg/dL — ABNORMAL HIGH (ref 70–99)
Glucose-Capillary: 90 mg/dL (ref 70–99)

## 2021-04-01 LAB — PHOSPHORUS: Phosphorus: 3.1 mg/dL (ref 2.5–4.6)

## 2021-04-01 LAB — MAGNESIUM: Magnesium: 2.2 mg/dL (ref 1.7–2.4)

## 2021-04-01 MED ORDER — CHLORHEXIDINE GLUCONATE CLOTH 2 % EX PADS
6.0000 | MEDICATED_PAD | Freq: Every day | CUTANEOUS | Status: DC
  Administered 2021-04-02 – 2021-04-26 (×24): 6 via TOPICAL

## 2021-04-01 MED ORDER — SODIUM CHLORIDE 0.9% FLUSH
10.0000 mL | INTRAVENOUS | Status: DC | PRN
  Administered 2021-04-03 – 2021-04-21 (×2): 10 mL

## 2021-04-01 NOTE — Progress Notes (Addendum)
Pt vomited tube feeds during med pass. Tube feeds turned off. Pt oral suctioned.   Pt was able to cough and clear most secretions. Was able to clear more secretions when turned onto his side while getting cleaned up.   Breathing was unlabored and regular. O2 96% with 3L [place] after event.   On call APP Olena Heckle, NP notified of event. CXR ordered.  When asked, pt says that he feels ok but wanted his HOB down due to feeling cramped in the bed. Pt educated on safety of HOB being >30degrees while on tube feeds and especially due to vomiting episode. Oral care performed. Pt states understanding of safety intervention.   CXR completed.   Throughout event pt feeding tube does not appear to have moved out place. Will continue to monitor pt and address needs PRN

## 2021-04-01 NOTE — Assessment & Plan Note (Addendum)
Aspiration with TF; core trak has been removed.

## 2021-04-01 NOTE — Progress Notes (Signed)
[facility]                                                                               Chad Russell, is a 88 y.o. male, DOB - March 24, 1926, PPI:[id number] Admit date - 04/10/2021    Outpatient Primary MD for the patient is Garwin Brothers, MD  LOS - 7  days    Brief summary   Chad Russell is a 88  yo male with PMH PAF, BPH, CAD, CHB, HTN, Parkinson's with dementia, OSA, arthritis who presented to the ER with worsening mentation.  He was recently admitted in December 2022 for PNA (negative covid at that time) due to presumed aspiration.  He has been less mobile since October 2022 with significant progressive decline from a mobility standpoint.  He has been residing at [facility] since discharge on 02/13/21.  He has been bedbound for several weeks since that time as well.  On workup he was reported to have increased confusion compared to baseline and associated fever.  He was found to be covid positive on workup and was started on Remdesivir. He also has a stage 4 sacral pressure ulcer that has had difficulty healing.    Assessment & Plan    Assessment and Plan: * Multifocal pneumonia- (present on admission) - Differential includes ongoing aspiration from underlying advanced Parkinson's disease/dementia versus contribution from COVID-pneumonia - given MRSA positive swab, antibiotics changed to IV vancomycin, cefepime, Flagyl for 7 days. -PICC line placed on 2/7 by IR -Cultures negative so far  Protein-calorie malnutrition, severe - Continue tube feeds  Dysphagia - Underwent SLP eval on 03/26/2021 with overt signs of dysphagia and risk for aspiration. -Currently receiving tube feeds, free water via core track -Unclear long-term plan, needs ongoing palliative GOC, wife hopeful for meaningful recovery, however has high risk of decompensation and aspiration.  Lung nodule - Multiple lung nodules noted on CT chest (RUL, RLL, LUL, LLL). Also has R>L pleural effusion.   Differential for effusion would include parapneumonic versus malignant -Regardless, given his extremely frail state, poor functional status, underlying dementia he would be a extremely poor treatment candidate -Dr Sabino Gasser updated wife on lung nodules on 2/4; per documentation she "already knew about them" and if "he never got cancer in the past he probably doesn't have it now" - per radiology fluid collection in right pleura not enough for thoracentesis at this time  Acute respiratory failure with hypoxia ([place])- (present on admission) - New O2 requirement in setting of PNA + Fever + COVID positive. -Continue steroids, wean O2 as tolerated   Sacral decubitus ulcer - stage 4- (present on admission) - No cellulitis or osteomyelitis on CT - follow rec's from wound care consult   Constipation- (present on admission) - s/p enema in ER -continue Dulcolax, Senokot-S  Parkinson disease ([place])- (present on admission) - Advanced PD with dementia, significant cerebral atrophy  on CT head as well -Continue Sinemet 4 times daily (was adjusted to QID from BID), improvement in his tremors since adjustment)   Goals of care, counseling/discussion - Overall poor prognosis and functional status with high risk of aspiration and decompensation.  DNR/DNI. -Palliative medicine consulted, appreciate Dr. Kirstie Mirza recommendation.  -  Tube feeds started on 2/4 via core track. -Needs ongoing palliative GOC   Atrial fibrillation ([place])- (present on admission) - Holding Xarelto while NPO. - continue lovenox for now until more [place] discussions   HTN (hypertension)- (present on admission) - Mild BP elevation on admission.  Continue lisinopril 10 mg daily    COVID-19 virus infection-resolved as of 03/30/2021, (present on admission) - continue remdesivir x 5 days (completed on 2/5) - CT value 33 on admission  - fax received from [facility] that his covid test was positive on 03/01/21 and he completed  isolation as well; will d/c isolation at this time   Acute metabolic encephalopathy-resolved as of 03/27/2021, (present on admission) Delirium due to PNA / COVID on top of chronic parkinson's dementia.   Code Status: DNR DVT Prophylaxis:    Lovenox, holding Xarelto Level of Care: Level of care: Progressive Family Communication: No family member at the bedside  Disposition Plan:     Remains inpatient appropriate: Currently on tube feeds via core track, needs ongoing GOC's for DC planning  Procedures:   Consultants:   Palliative medicine  Antimicrobials:  IV vancomycin IV cefepime IV Flagyl Completed remdesivir on 2/5   Medications  Scheduled Meds:  carbidopa-levodopa  1 tablet Oral QID   enoxaparin (LOVENOX) injection  75 mg Subcutaneous Q12H   feeding supplement (PROSource TF)  45 mL Per Tube BID   free water  150 mL Per Tube Q6H   Gerhardt's butt cream   Topical Daily   lisinopril  10 mg Oral Daily   methylPREDNISolone (SOLU-MEDROL) injection  40 mg Intravenous BID   nutrition supplement (JUVEN)  1 packet Oral BID BM   polyethylene glycol  17 g Oral BID   senna-docusate  2 tablet Oral BID   sodium phosphate  1 enema Rectal Once   timolol  1 drop Both Eyes QHS   Continuous Infusions:  ceFEPime (MAXIPIME) IV Stopped (04/01/21 0503)   feeding supplement (OSMOLITE 1.5 CAL) 1,000 mL (04/01/21 0503)   metronidazole Stopped (04/01/21 0303)   vancomycin 1,500 mg (03/31/21 1529)   PRN Meds:.acetaminophen, atropine, bisacodyl, HYDROcodone-acetaminophen, lidocaine (PF), ondansetron **OR** ondansetron (ZOFRAN) IV    Subjective:   Chad Russell was seen and examined today.  Alert and awake, appears comfortable however very deconditioned.  Difficult to obtain review of system from the patient.  No acute events overnight.  Has chronic underlying dementia  Objective:   Vitals:   03/31/21 1153 03/31/21 2013 04/01/21 0500 04/01/21 0527  BP: 126/74 (!) 152/80  (!) 149/72   Pulse: (!) 33 (!) 51  (!) 101  Resp: 16     Temp: 97.7 F (36.5 C) 98 F (36.7 C)  97.7 F (36.5 C)  TempSrc: Oral Axillary  Axillary  SpO2: 96% 100%  98%  Weight:   80.7 kg   Height:        Intake/Output Summary (Last 24 hours) at 04/01/2021 1201 Last data filed at 04/01/2021 0503 Gross per 24 hour  Intake 300 ml  Output 700 ml  Net -400 ml   Filed Weights   03/30/21 0500 03/31/21 0500 04/01/21 0500  Weight: 74.8 kg 79.4 kg 80.7 kg     Exam General: Alert and awake, appears comfortable, ill-appearing Cardiovascular: S1 S2 auscultated, RRR Respiratory: Decreased breath sounds at the bases with rhonchi Gastrointestinal: Soft, nontender, nondistended, + bowel sounds Ext: no pedal edema bilaterally Neuro: chronic underlying dementia, follows commands,   Data Reviewed:  I have personally reviewed  following labs and imaging studies   CBC Lab Results  Component Value Date   WBC 15.9 (H) 04/01/2021   RBC 3.39 (L) 04/01/2021   HGB 10.2 (L) 04/01/2021   HCT 30.5 (L) 04/01/2021   MCV 90.0 04/01/2021   MCH 30.1 04/01/2021   PLT 304 04/01/2021   MCHC 33.4 04/01/2021   RDW 17.7 (H) 04/01/2021   LYMPHSABS 0.6 (L) 04/01/2021   MONOABS 0.5 04/01/2021   EOSABS 0.0 04/01/2021   BASOSABS 0.0 88/91/6945     Last metabolic panel Lab Results  Component Value Date   NA 131 (L) 04/01/2021   K 4.0 04/01/2021   CL 98 04/01/2021   CO2 28 04/01/2021   BUN 27 (H) 04/01/2021   CREATININE 0.62 04/01/2021   GLUCOSE 142 (H) 04/01/2021   GFRNONAA >60 04/01/2021   GFRAA >60 12/24/2018   CALCIUM 8.2 (L) 04/01/2021   PHOS 3.1 04/01/2021   PROT 5.3 (L) 04/01/2021   ALBUMIN 2.8 (L) 04/01/2021   LABGLOB 2.8 01/04/2017   AGRATIO 1.7 11/09/2016   BILITOT 0.8 04/01/2021   ALKPHOS 62 04/01/2021   AST 49 (H) 04/01/2021   ALT 45 (H) 04/01/2021   ANIONGAP 5 04/01/2021    CBG (last 3)  Recent Labs    04/01/21 0437 04/01/21 0746 04/01/21 1127  GLUCAP 135* 125* 90       Coagulation Profile: Recent Labs  Lab 04/10/2021 1749  INR 1.4*     Radiology Studies: IR US Guide Vasc Access Left  Result Date: 03/31/2021 INDICATION: Request for PICC placement due to poor venous access. EXAM: ULTRASOUND AND FLUOROSCOPIC GUIDED PICC LINE INSERTION MEDICATIONS: 4 mL 1% lidocaine CONTRAST:  None FLUOROSCOPY TIME:  154 seconds (3.7 mGy) COMPLICATIONS: None immediate. TECHNIQUE: The procedure, risks, benefits, and alternatives were explained to the patient and informed written consent was obtained. The left upper extremity was prepped with chlorhexidine in a sterile fashion, and a sterile drape was applied covering the operative field. Maximum barrier sterile technique with sterile gowns and gloves were used for the procedure. A timeout was performed prior to the initiation of the procedure. Local anesthesia was provided with 1% lidocaine. After the overlying soft tissues were anesthetized with 1% lidocaine, a micropuncture kit was utilized to access the left brachial vein. Real-time ultrasound guidance was utilized for vascular access including the acquisition of a permanent ultrasound image documenting patency of the accessed vessel. A guidewire was advanced to the level of the superior caval-atrial junction for measurement purposes and the PICC line was cut to length. A peel-away sheath was placed and a 41 cm, 5 [place], dual lumen was inserted to level of the superior caval-atrial junction. A post procedure spot fluoroscopic was obtained. The catheter easily aspirated and flushed and was secured in place. A dressing was placed. The patient tolerated the procedure well without immediate post procedural complication. FINDINGS: After catheter placement, the tip lies within the superior cavoatrial junction. The catheter aspirates and flushes normally and is ready for immediate use. IMPRESSION: Successful ultrasound and fluoroscopic guided placement of a left brachial vein approach, 41  cm, 5 French, dual lumen PICC with tip at the superior caval-atrial junction. The PICC line is ready for immediate use. Read by: Narda Rutherford, AGNP-BC Electronically Signed   By: Miachel Roux M.D.   On: 03/31/2021 09:29   IR PICC PLACEMENT LEFT >5 YRS INC IMG GUIDE  Result Date: 03/31/2021 INDICATION: Request for PICC placement due to poor venous access. EXAM: ULTRASOUND AND FLUOROSCOPIC GUIDED  PICC LINE INSERTION MEDICATIONS: 4 mL 1% lidocaine CONTRAST:  None FLUOROSCOPY TIME:  154 seconds (3.7 mGy) COMPLICATIONS: None immediate. TECHNIQUE: The procedure, risks, benefits, and alternatives were explained to the patient and informed written consent was obtained. The left upper extremity was prepped with chlorhexidine in a sterile fashion, and a sterile drape was applied covering the operative field. Maximum barrier sterile technique with sterile gowns and gloves were used for the procedure. A timeout was performed prior to the initiation of the procedure. Local anesthesia was provided with 1% lidocaine. After the overlying soft tissues were anesthetized with 1% lidocaine, a micropuncture kit was utilized to access the left brachial vein. Real-time ultrasound guidance was utilized for vascular access including the acquisition of a permanent ultrasound image documenting patency of the accessed vessel. A guidewire was advanced to the level of the superior caval-atrial junction for measurement purposes and the PICC line was cut to length. A peel-away sheath was placed and a 41 cm, 5 [place], dual lumen was inserted to level of the superior caval-atrial junction. A post procedure spot fluoroscopic was obtained. The catheter easily aspirated and flushed and was secured in place. A dressing was placed. The patient tolerated the procedure well without immediate post procedural complication. FINDINGS: After catheter placement, the tip lies within the superior cavoatrial junction. The catheter aspirates and flushes normally  and is ready for immediate use. IMPRESSION: Successful ultrasound and fluoroscopic guided placement of a left brachial vein approach, 41 cm, 5 French, dual lumen PICC with tip at the superior caval-atrial junction. The PICC line is ready for immediate use. Read by: Narda Rutherford, AGNP-BC Electronically Signed   By: Miachel Roux M.D.   On: 03/31/2021 09:29       Chad Russell M.D. [facility] 04/01/2021, 12:01 PM  Available via Epic secure chat 7am-7pm After 7 pm, please refer to night coverage provider listed on amion.

## 2021-04-01 NOTE — Telephone Encounter (Signed)
Called pt's spouse Mardene Celeste. She had called to discuss his care during hospital.  Listened to concerns and recommended continued communication with his care team.  Support given during this difficult time in making some tough decisions in his care and decisions with consideration of quality of life. Noted visit with palliative care yesterday. Advised to let me know if I can help further with discussions or listening in next few days if needed. She thanked me for the call.

## 2021-04-01 NOTE — Progress Notes (Signed)
Daily Progress Note   Patient Name: Chad Russell       Date: 04/01/2021 DOB: 1926/11/29  Age: 88 y.o. MRN#: [id number] Attending Physician: Mendel Corning, MD Primary Care Physician: Garwin Brothers, MD Admit Date: 04/02/2021  Reason for Consultation/Follow-up: Establishing goals of care  Subjective: I met today with Mr. Bonura and his wife.  He appeared to be sleeping and did not really interact with me very much today.  His wife was distracted looking for cards in her wallet and it was difficult to direct her in conversation.  We discussed same concerns as yesterday of seeing how he continues to progress in regard to his nutrition, cognition, and functional status.  She remains hopeful for continued improvement and is limited in ability to engage in conversation about anything other than his recovery.  Length of Stay: 7  Current Medications: Scheduled Meds:   carbidopa-levodopa  1 tablet Oral QID   enoxaparin (LOVENOX) injection  75 mg Subcutaneous Q12H   feeding supplement (PROSource TF)  45 mL Per Tube BID   free water  150 mL Per Tube Q6H   Gerhardt's butt cream   Topical Daily   lisinopril  10 mg Oral Daily   methylPREDNISolone (SOLU-MEDROL) injection  40 mg Intravenous BID   nutrition supplement (JUVEN)  1 packet Oral BID BM   polyethylene glycol  17 g Oral BID   senna-docusate  2 tablet Oral BID   sodium phosphate  1 enema Rectal Once   timolol  1 drop Both Eyes QHS    Continuous Infusions:  ceFEPime (MAXIPIME) IV 2 g (04/01/21 1555)   feeding supplement (OSMOLITE 1.5 CAL) 1,000 mL (04/01/21 0503)   metronidazole 500 mg (04/01/21 1603)   vancomycin 1,500 mg (04/01/21 1818)    PRN Meds: acetaminophen, atropine, bisacodyl, HYDROcodone-acetaminophen, lidocaine (PF),  ondansetron **OR** ondansetron (ZOFRAN) IV  Physical Exam         Patient appears pale and weak Patient appears chronically ill Has NG tube Has coarse breath sounds Does not not have edema Has muscle wasting  Vital Signs: BP (!) 149/72    Pulse (!) 101    Temp 97.7 F (36.5 C) (Axillary)    Resp 16    Ht 6' 1"  (1.854 m)    Wt 80.7 kg    SpO2 98%  BMI 23.48 kg/m  SpO2: SpO2: 98 % O2 Device: O2 Device: Nasal Cannula O2 Flow Rate: O2 Flow Rate (L/min): 2 L/min  Intake/output summary:  Intake/Output Summary (Last 24 hours) at 04/01/2021 1845 Last data filed at 04/01/2021 1603 Gross per 24 hour  Intake 325.09 ml  Output 400 ml  Net -74.91 ml    LBM: Last BM Date: 03/31/21 Baseline Weight: Weight: 74.8 kg Most recent weight: Weight: 80.7 kg       Palliative Assessment/Data:      Patient Active Problem List   Diagnosis Date Noted   Dysphagia 03/27/2021   Protein-calorie malnutrition, severe 03/27/2021   Lung nodule 03/26/2021   Constipation 04/05/2021   Sacral decubitus ulcer - stage 4 03/29/2021   Acute respiratory failure with hypoxia (HCC) 04/13/2021   Pressure injury of skin 02/10/2021   Generalized weakness 02/07/2021   Dehydration 02/07/2021   Acute prerenal azotemia 02/07/2021   Parkinson disease ([place])    Protein calorie malnutrition ([place])    Multifocal pneumonia 02/06/2021   UTI (urinary tract infection) 10/14/2020   Bradycardia 10/14/2020   Educated about COVID-19 virus infection 11/29/2019   Exudative age-related macular degeneration of left eye with active choroidal neovascularization ([place]) 10/02/2019   Central retinal vein occlusion with macular edema of right eye 06/20/2019   Central retinal vein occlusion with neovascularization of right eye 06/20/2019   Ocular ischemic syndrome 06/20/2019   Retinal hemorrhage of left eye 06/20/2019   Retinal hemorrhage of right eye 06/20/2019   AKI (acute kidney injury) ([place]) 12/15/2018   Hematoma of scalp    Heart  block    Chest pain 05/04/2018   Fracture of femoral neck, left ([place]) 04/19/2017   MGUS (monoclonal gammopathy of unknown significance) 01/15/2017   BPH with obstruction/lower urinary tract symptoms 09/07/2016   Goals of care, counseling/discussion 08/24/2016   Urinary retention 05/13/2016   Balance problem 10/31/2013   OSA (obstructive sleep apnea) 10/31/2013   BPH (benign prostatic hyperplasia) 08/28/2012   Atrial fibrillation ([place]) 06/08/2012   Hyperplasia of prostate with lower urinary tract symptoms (LUTS) 04/28/2012   Moderate malnutrition ([place]) 04/28/2012   Infection of urinary tract - recurrent 04/26/2012   NSTEMI (non-ST elevated myocardial infarction) ([place]) 03/15/2011   Complete heart block ([place]) 03/15/2011   HTN (hypertension) 03/15/2011    Palliative Care Assessment & Plan   Patient Profile:    Assessment: Multifocal pneumonia present on admission COVID-19 virus infection present on admission Acute hypoxic respiratory failure Sacral decubitus ulcer present on admission History of lung nodule History of Parkinson's disease. Patient with recent admission in December 2022 for pneumonia, progressive functional decline since October 22 from mobility standpoint, was recently at [facility] and was noted to have been bedbound, found to have stage IV pressure sacral pressure ulcer with a difficulty in healing.  Recommendations/Plan: DNR/DNI Continues to be invested in plan for current care and wife remains hopeful he will show improvement.  We will continue to follow and progress conversation as she is emotionally able but it is challenging to keep her on task with discussion.  Discussed that major concern will be nutrition and hydration as he is currently receiving supplemental nutrition and hydration via NG tube.  Discussed that his course of antibiotics will be completed soon and we will need to determine if he is going to be able to sustain himself with nutrition  and hydration long-term.      Code Status:    Code Status Orders  (From admission, onward)  Start     Ordered   03/26/2021 2126  Full code  Continuous        04/13/2021 2126           Code Status History     Date Active Date Inactive Code Status Order ID Comments User Context   02/06/2021 2006 02/14/2021 0624 Full Code [id number]  Rhetta Mura, DO ED   10/14/2020 2134 10/21/2020 1634 Full Code [id number]  Shela Leff, MD ED   12/14/2018 1947 12/26/2018 2232 Full Code [id number]  Orene Desanctis, DO ED   04/19/2017 2321 04/23/2017 0035 Full Code [id number]  Marchia Bond, MD Inpatient   01/17/2017 1142 01/17/2017 1904 Full Code [id number]  Angelia Mould, MD Inpatient   09/07/2016 1207 09/09/2016 1657 Full Code [id number]  Irine Seal, MD Inpatient   04/26/2012 2134 04/29/2012 1720 Full Code [id number]  Gerda Diss, DO Inpatient   03/15/2011 2127 03/17/2011 1522 Full Code [id number]  Bamimore, Earney Navy, MD ED       Prognosis:  Guarded   Discharge Planning: To Be Determined  Care plan was discussed with  wife at bedside.   Micheline Rough, MD  Please contact Palliative Medicine Team phone at [phone] for questions and concerns.

## 2021-04-02 DIAGNOSIS — Z7189 Other specified counseling: Secondary | ICD-10-CM | POA: Diagnosis not present

## 2021-04-02 DIAGNOSIS — J9601 Acute respiratory failure with hypoxia: Secondary | ICD-10-CM | POA: Diagnosis not present

## 2021-04-02 DIAGNOSIS — R1310 Dysphagia, unspecified: Secondary | ICD-10-CM | POA: Diagnosis not present

## 2021-04-02 DIAGNOSIS — J189 Pneumonia, unspecified organism: Secondary | ICD-10-CM | POA: Diagnosis not present

## 2021-04-02 DIAGNOSIS — G9341 Metabolic encephalopathy: Secondary | ICD-10-CM | POA: Diagnosis not present

## 2021-04-02 LAB — CBC WITH DIFFERENTIAL/PLATELET
Abs Immature Granulocytes: 0.26 10*3/uL — ABNORMAL HIGH (ref 0.00–0.07)
Basophils Absolute: 0.1 10*3/uL (ref 0.0–0.1)
Basophils Relative: 0 %
Eosinophils Absolute: 0 10*3/uL (ref 0.0–0.5)
Eosinophils Relative: 0 %
HCT: 32 % — ABNORMAL LOW (ref 39.0–52.0)
Hemoglobin: 10.4 g/dL — ABNORMAL LOW (ref 13.0–17.0)
Immature Granulocytes: 1 %
Lymphocytes Relative: 3 %
Lymphs Abs: 0.6 10*3/uL — ABNORMAL LOW (ref 0.7–4.0)
MCH: 30.1 pg (ref 26.0–34.0)
MCHC: 32.5 g/dL (ref 30.0–36.0)
MCV: 92.5 fL (ref 80.0–100.0)
Monocytes Absolute: 0.5 10*3/uL (ref 0.1–1.0)
Monocytes Relative: 3 %
Neutro Abs: 18.2 10*3/uL — ABNORMAL HIGH (ref 1.7–7.7)
Neutrophils Relative %: 93 %
Platelets: 349 10*3/uL (ref 150–400)
RBC: 3.46 MIL/uL — ABNORMAL LOW (ref 4.22–5.81)
RDW: 18.4 % — ABNORMAL HIGH (ref 11.5–15.5)
WBC: 19.6 10*3/uL — ABNORMAL HIGH (ref 4.0–10.5)
nRBC: 0 % (ref 0.0–0.2)

## 2021-04-02 LAB — COMPREHENSIVE METABOLIC PANEL
ALT: 38 U/L (ref 0–44)
AST: 33 U/L (ref 15–41)
Albumin: 3 g/dL — ABNORMAL LOW (ref 3.5–5.0)
Alkaline Phosphatase: 70 U/L (ref 38–126)
Anion gap: 7 (ref 5–15)
BUN: 36 mg/dL — ABNORMAL HIGH (ref 8–23)
CO2: 26 mmol/L (ref 22–32)
Calcium: 8.4 mg/dL — ABNORMAL LOW (ref 8.9–10.3)
Chloride: 97 mmol/L — ABNORMAL LOW (ref 98–111)
Creatinine, Ser: 0.51 mg/dL — ABNORMAL LOW (ref 0.61–1.24)
GFR, Estimated: >60 mL/min (ref >60)
Glucose, Bld: 121 mg/dL — ABNORMAL HIGH (ref 70–99)
Potassium: 4.1 mmol/L (ref 3.5–5.1)
Sodium: 130 mmol/L — ABNORMAL LOW (ref 135–145)
Total Bilirubin: 0.6 mg/dL (ref 0.3–1.2)
Total Protein: 5.8 g/dL — ABNORMAL LOW (ref 6.5–8.1)

## 2021-04-02 LAB — GLUCOSE, CAPILLARY
Glucose-Capillary: 116 mg/dL — ABNORMAL HIGH (ref 70–99)
Glucose-Capillary: 122 mg/dL — ABNORMAL HIGH (ref 70–99)
Glucose-Capillary: 123 mg/dL — ABNORMAL HIGH (ref 70–99)
Glucose-Capillary: 99 mg/dL (ref 70–99)
Glucose-Capillary: 109 mg/dL — ABNORMAL HIGH (ref 70–99)

## 2021-04-02 MED ORDER — HYDROMORPHONE HCL 1 MG/ML IJ SOLN
0.3000 mg | INTRAMUSCULAR | Status: DC | PRN
  Administered 2021-04-02: 0.3 mg via INTRAVENOUS
  Filled 2021-04-02: qty 0.5

## 2021-04-02 MED ORDER — GLYCOPYRROLATE 0.2 MG/ML IJ SOLN
0.1000 mg | Freq: Once | INTRAMUSCULAR | Status: AC
  Administered 2021-04-02: 0.1 mg via INTRAVENOUS
  Filled 2021-04-02: qty 1

## 2021-04-02 NOTE — Care Management Important Message (Signed)
Important Message  Patient Details IM Letter placed in Patients room. Name: Chad Russell MRN: [id number] Date of Birth: [birth year]   Medicare Important Message Given:  Yes     Kerin Salen 04/02/2021, 1:06 PM

## 2021-04-02 NOTE — Progress Notes (Signed)
Daily Progress Note   Patient Name: Chad Russell       Date: 04/02/2021 DOB: 12-19-1926  Age: 88 y.o. MRN#: [id number] Attending Physician: Mendel Corning, MD Primary Care Physician: Garwin Brothers, MD Admit Date: 04/01/2021  Reason for Consultation/Follow-up: Establishing goals of care  Subjective: I met today with patient's wife.  He is somnolent and did not respond to gentle verbal or tactile stimulation.  We discussed clinical course overnight with aspiration event and concern that he is not going to recover regardless of interventions moving forward.  See below.  Length of Stay: 8  Current Medications: Scheduled Meds:   carbidopa-levodopa  1 tablet Oral QID   Chlorhexidine Gluconate Cloth  6 each Topical Daily   enoxaparin (LOVENOX) injection  75 mg Subcutaneous Q12H   feeding supplement (PROSource TF)  45 mL Per Tube BID   free water  150 mL Per Tube Q6H   Gerhardt's butt cream   Topical Daily   glycopyrrolate  0.1 mg Intravenous Once   lisinopril  10 mg Oral Daily   methylPREDNISolone (SOLU-MEDROL) injection  40 mg Intravenous BID   nutrition supplement (JUVEN)  1 packet Oral BID BM   polyethylene glycol  17 g Oral BID   senna-docusate  2 tablet Oral BID   sodium phosphate  1 enema Rectal Once   timolol  1 drop Both Eyes QHS    Continuous Infusions:  ceFEPime (MAXIPIME) IV 2 g (04/02/21 0620)   feeding supplement (OSMOLITE 1.5 CAL) Stopped (04/01/21 2230)   metronidazole 500 mg (04/02/21 0101)   vancomycin Stopped (04/01/21 2018)    PRN Meds: acetaminophen, atropine, bisacodyl, HYDROcodone-acetaminophen, HYDROmorphone (DILAUDID) injection, lidocaine (PF), ondansetron **OR** ondansetron (ZOFRAN) IV, sodium chloride flush  Physical Exam         Patient appears pale  and weak Patient appears chronically ill Has NG tube Has coarse breath sounds Does not not have edema Has muscle wasting Does not wake or speak to me today  Vital Signs: BP 111/68 (BP Location: Right Arm)    Pulse (!) 49    Temp 98.4 F (36.9 C) (Axillary)    Resp 18    Ht 6' 1"  (1.854 m)    Wt 79.8 kg    SpO2 (!) 82%    BMI 23.22 kg/m  SpO2: SpO2: [place])  82 % O2 Device: O2 Device: Nasal Cannula O2 Flow Rate: O2 Flow Rate (L/min): 6 L/min  Intake/output summary:  Intake/Output Summary (Last 24 hours) at 04/02/2021 1254 Last data filed at 04/02/2021 0500 Gross per 24 hour  Intake 1384.84 ml  Output 1000 ml  Net 384.84 ml    LBM: Last BM Date: 03/31/21 Baseline Weight: Weight: 74.8 kg Most recent weight: Weight: 79.8 kg       Palliative Assessment/Data:      Patient Active Problem List   Diagnosis Date Noted   Dysphagia 03/27/2021   Protein-calorie malnutrition, severe 03/27/2021   Lung nodule 03/26/2021   Constipation 04/13/2021   Sacral decubitus ulcer - stage 4 03/26/2021   Acute respiratory failure with hypoxia ([place]) 04/20/2021   Pressure injury of skin 02/10/2021   Generalized weakness 02/07/2021   Dehydration 02/07/2021   Acute prerenal azotemia 02/07/2021   Parkinson disease ([place])    Protein calorie malnutrition ([place])    Multifocal pneumonia 02/06/2021   UTI (urinary tract infection) 10/14/2020   Bradycardia 10/14/2020   Educated about COVID-19 virus infection 11/29/2019   Exudative age-related macular degeneration of left eye with active choroidal neovascularization ([place]) 10/02/2019   Central retinal vein occlusion with macular edema of right eye 06/20/2019   Central retinal vein occlusion with neovascularization of right eye 06/20/2019   Ocular ischemic syndrome 06/20/2019   Retinal hemorrhage of left eye 06/20/2019   Retinal hemorrhage of right eye 06/20/2019   AKI (acute kidney injury) ([place]) 12/15/2018   Hematoma of scalp    Heart block    Chest pain  05/04/2018   Fracture of femoral neck, left ([place]) 04/19/2017   MGUS (monoclonal gammopathy of unknown significance) 01/15/2017   BPH with obstruction/lower urinary tract symptoms 09/07/2016   Goals of care, counseling/discussion 08/24/2016   Urinary retention 05/13/2016   Balance problem 10/31/2013   OSA (obstructive sleep apnea) 10/31/2013   BPH (benign prostatic hyperplasia) 08/28/2012   Atrial fibrillation ([place]) 06/08/2012   Hyperplasia of prostate with lower urinary tract symptoms (LUTS) 04/28/2012   Moderate malnutrition ([place]) 04/28/2012   Infection of urinary tract - recurrent 04/26/2012   NSTEMI (non-ST elevated myocardial infarction) ([place]) 03/15/2011   Complete heart block ([place]) 03/15/2011   HTN (hypertension) 03/15/2011    Palliative Care Assessment & Plan   Patient Profile:    Assessment: Multifocal pneumonia present on admission COVID-19 virus infection present on admission Acute hypoxic respiratory failure Sacral decubitus ulcer present on admission History of lung nodule History of Parkinson's disease. Patient with recent admission in December 2022 for pneumonia, progressive functional decline since October 22 from mobility standpoint, was recently at [facility] and was noted to have been bedbound, found to have stage IV pressure sacral pressure ulcer with a difficulty in healing.  Recommendations/Plan: DNR/DNI Continue current care without escalation.  She understands that he is no longer getting feeds via NGT and that we have no source for nutrition that is not going to result in further aspiration.  She is still processing and remains hopeful that he is going to "turn around."  She focuses on the fact that he did not have IV access for a period of time and feels that the antibiotics "need time to catch up."  I was clear with her today that I think that his body is shutting down and he is dying.  I also discussed that, even if we are treating with medical  interventions, the fact remains that he is aspirating and this  is not something that can be fixed, even with artificial nutrition and hydration, as has been demonstrated by the fact he aspirated on tube feeds last night.   We discussed plan to continue current care without escalation, utilize medications as needed for symptoms regardless of concern for side effects if he needs them for symptomatic relief, and reassess in 24 hours.  If he has not shown improvement tomorrow, we discussed that it would be clear that he is not going to recover and my recommendation would remain that we should focus only on interventions for his comfort.      Code Status:    Code Status Orders  (From admission, onward)           Start     Ordered   03/31/2021 2126  Full code  Continuous        03/26/2021 2126           Code Status History     Date Active Date Inactive Code Status Order ID Comments User Context   02/06/2021 2006 02/14/2021 0624 Full Code [id number]  Rhetta Mura, DO ED   10/14/2020 2134 10/21/2020 1634 Full Code [id number]  Shela Leff, MD ED   12/14/2018 1947 12/26/2018 2232 Full Code [id number]  Orene Desanctis, DO ED   04/19/2017 2321 04/23/2017 0035 Full Code [id number]  Marchia Bond, MD Inpatient   01/17/2017 1142 01/17/2017 1904 Full Code [id number]  Angelia Mould, MD Inpatient   09/07/2016 1207 09/09/2016 1657 Full Code [id number]  Irine Seal, MD Inpatient   04/26/2012 2134 04/29/2012 1720 Full Code [id number]  Gerda Diss, DO Inpatient   03/15/2011 2127 03/17/2011 1522 Full Code [id number]  Bamimore, Earney Navy, MD ED       Prognosis:  Poor.  I believe he is going to continue to decline regardless of interventions moving forward.  Discharge Planning: To Be Determined- I think there is a high likelihood that this will be a terminal admission  Care plan was discussed with  wife at bedside.   Total time: 39 minutes  Micheline Rough, MD  Please contact Palliative Medicine  Team phone at [phone] for questions and concerns.

## 2021-04-02 NOTE — Progress Notes (Signed)
[facility]                                                                               Chad Russell, is a 88 y.o. male, DOB - 11-08-1926, TOI:[id number] Admit date - 03/30/2021    Outpatient Primary MD for the patient is Chad Brothers, MD  LOS - 8  days    Brief summary   Chad Russell is a 88  yo male with PMH PAF, BPH, CAD, CHB, HTN, Parkinson's with dementia, OSA, arthritis who presented to the ER with worsening mentation.  He was recently admitted in December 2022 for PNA (negative covid at that time) due to presumed aspiration.  He has been less mobile since October 2022 with significant progressive decline from a mobility standpoint.  He has been residing at [facility] since discharge on 02/13/21.  He has been bedbound for several weeks since that time as well.  On workup he was reported to have increased confusion compared to baseline and associated fever.  He was found to be covid positive on workup and was started on Remdesivir. He also has a stage 4 sacral pressure ulcer that has had difficulty healing.    Assessment & Plan    Assessment and Plan: * Multifocal pneumonia- (present on admission) - Differential includes ongoing aspiration from underlying advanced Parkinson's disease/dementia versus contribution from COVID-pneumonia - given MRSA positive swab, antibiotics were changed to IV vancomycin, cefepime, Flagyl for 7 days. -PICC line placed on 2/7 by IR -Cultures negative so far  Protein-calorie malnutrition, severe - Hold tube feeds  Dysphagia - Underwent SLP eval on 03/26/2021 with overt signs of dysphagia and risk for aspiration. -Unclear long-term plan, needs ongoing palliative GOC, wife hopeful for meaningful recovery -Given overnight issues, clearly aspirating, will continue to hold the tube feeds   Lung nodule - Multiple lung nodules noted on CT chest (RUL, RLL, LUL, LLL). Also has R>L pleural effusion.  Differential for effusion  would include parapneumonic versus malignant -Regardless, given his extremely frail state, poor functional status, underlying dementia he would be a extremely poor treatment candidate -Dr Sabino Gasser updated wife on lung nodules on 2/4; per documentation she "already knew about them" and if "he never got cancer in the past he probably doesn't have it now" - per radiology fluid collection in right pleura not enough for thoracentesis at this time  Acute respiratory failure with hypoxia ([place])- (present on admission) - Worsening hypoxia due to aspiration, PNA, COVID, severe deconditioning -Overnight was placed on nonrebreather, monitor closely.  Sacral decubitus ulcer - stage 4- (present on admission) - No cellulitis or osteomyelitis on CT - follow wound care consult recommendations   Constipation- (present on admission) - s/p enema in ER -continue Dulcolax, Senokot-S  Parkinson disease ([place])- (present on admission) - Advanced PD with dementia, significant cerebral atrophy  on CT head as well -Sinemet was increased to 4 times daily (was adjusted to QID from BID due to tremors) -Clearly aspirating, tube feeds on hold, n.p.o.  Goals of care, counseling/discussion - Overall poor prognosis and functional status with high risk of aspiration and decompensation.  DNR/DNI. -Overnight patient decompensated with hypoxia, aspirating, was placed to  4.5 L and then on nonrebreather.  On exam, weak cough with congestion, coarse BS throughout.  Somnolent, not responding to any verbal commands. -Meaningful recovery unlikely with any kind of artificial feeding at this point, patient is clearly aspirating the tube feeds.  -Dr. Domingo Cocking discussed goals of care again with patient's wife today, she is still hopeful for recovery although unlikely at this point.  Atrial fibrillation ([place])- (present on admission) - Holding Xarelto while NPO. - continue lovenox for now  HTN (hypertension)- (present on admission) - BP  soft, hold lisinopril   COVID-19 virus infection-resolved as of 03/30/2021, (present on admission) - continue remdesivir x 5 days (completed on 2/5) - CT value 33 on admission  - fax received from [facility] that his covid test was positive on 03/01/21 and he completed isolation as well; will d/c isolation at this time   Acute metabolic encephalopathy-resolved as of 03/27/2021, (present on admission) Delirium due to PNA / COVID on top of chronic parkinson's dementia.    Code Status: DNR  DVT Prophylaxis:    Level of Care: Level of care: Progressive Family Communication:  Disposition Plan:     Remains inpatient appropriate:    Procedures:    Consultants:   Palliative   Antimicrobials:  IV vancomycin IV cefepime IV Flagyl Completed remdesivir on 2/5    Medications  Scheduled Meds:  carbidopa-levodopa  1 tablet Oral QID   Chlorhexidine Gluconate Cloth  6 each Topical Daily   enoxaparin (LOVENOX) injection  75 mg Subcutaneous Q12H   feeding supplement (PROSource TF)  45 mL Per Tube BID   free water  150 mL Per Tube Q6H   Gerhardt's butt cream   Topical Daily   methylPREDNISolone (SOLU-MEDROL) injection  40 mg Intravenous BID   nutrition supplement (JUVEN)  1 packet Oral BID BM   polyethylene glycol  17 g Oral BID   senna-docusate  2 tablet Oral BID   sodium phosphate  1 enema Rectal Once   timolol  1 drop Both Eyes QHS   Continuous Infusions:  ceFEPime (MAXIPIME) IV 2 g (04/02/21 1314)   feeding supplement (OSMOLITE 1.5 CAL) Stopped (04/01/21 2230)   metronidazole 500 mg (04/02/21 1414)   vancomycin Stopped (04/01/21 2018)   PRN Meds:.acetaminophen, atropine, bisacodyl, HYDROcodone-acetaminophen, HYDROmorphone (DILAUDID) injection, lidocaine (PF), ondansetron **OR** ondansetron (ZOFRAN) IV, sodium chloride flush    Subjective:   Chad Russell was seen and examined today.   Overnight events noted, patient somnolent, not responding to any verbal  commands, coarse breath sounds.  Tube feeds on hold   Objective:   Vitals:   04/01/21 1959 04/02/21 0445 04/02/21 0610 04/02/21 0800  BP: (!) 112/59 (!) 132/117 111/68   Pulse: 83 (!) 57 (!) 49   Resp:  18    Temp: (!) 97.5 F (36.4 C) 98.2 F (36.8 C) 98.4 F (36.9 C)   TempSrc: Oral Oral Axillary   SpO2: 97%  94% (!) 82%  Weight:   79.8 kg   Height:        Intake/Output Summary (Last 24 hours) at 04/02/2021 1414 Last data filed at 04/02/2021 0500 Gross per 24 hour  Intake 1384.84 ml  Output 1000 ml  Net 384.84 ml   Filed Weights   03/31/21 0500 04/01/21 0500 04/02/21 0610  Weight: 79.4 kg 80.7 kg 79.8 kg     Exam General: Somnolent, not responding to any verbal or tactile stimuli Cardiovascular: S1 S2 auscultated, no murmurs, RRR Respiratory: Coarse breath sounds throughout  Gastrointestinal: Soft, nontender, nondistended, + bowel sounds Ext: no pedal edema bilaterally Neuro: not responding to any verbal commands or tactile stimuli   Data Reviewed:  I have personally reviewed following labs and imaging studies   CBC Lab Results  Component Value Date   WBC 19.6 (H) 04/02/2021   RBC 3.46 (L) 04/02/2021   HGB 10.4 (L) 04/02/2021   HCT 32.0 (L) 04/02/2021   MCV 92.5 04/02/2021   MCH 30.1 04/02/2021   PLT 349 04/02/2021   MCHC 32.5 04/02/2021   RDW 18.4 (H) 04/02/2021   LYMPHSABS 0.6 (L) 04/02/2021   MONOABS 0.5 04/02/2021   EOSABS 0.0 04/02/2021   BASOSABS 0.1 50/38/8828     Last metabolic panel Lab Results  Component Value Date   NA 130 (L) 04/02/2021   K 4.1 04/02/2021   CL 97 (L) 04/02/2021   CO2 26 04/02/2021   BUN 36 (H) 04/02/2021   CREATININE 0.51 (L) 04/02/2021   GLUCOSE 121 (H) 04/02/2021   GFRNONAA >60 04/02/2021   GFRAA >60 12/24/2018   CALCIUM 8.4 (L) 04/02/2021   PHOS 3.1 04/01/2021   PROT 5.8 (L) 04/02/2021   ALBUMIN 3.0 (L) 04/02/2021   LABGLOB 2.8 01/04/2017   AGRATIO 1.7 11/09/2016   BILITOT 0.6 04/02/2021   ALKPHOS 70  04/02/2021   AST 33 04/02/2021   ALT 38 04/02/2021   ANIONGAP 7 04/02/2021    CBG (last 3)  Recent Labs    04/02/21 0447 04/02/21 0749 04/02/21 1251  GLUCAP 116* 122* 123*      Coagulation Profile: No results for input(s): INR, PROTIME in the last 168 hours.   Radiology Studies: DG CHEST PORT 1 VIEW  Result Date: 04/01/2021 CLINICAL DATA:  Aspiration into airway. EXAM: PORTABLE CHEST 1 VIEW COMPARISON:  04/11/2021 FINDINGS: Cardiac enlargement. No vascular congestion. Bilateral perihilar infiltrates, similar to prior study, possibly aspiration or edema. Small bilateral pleural effusions. Bilateral calcified pleural plaques. There has been interval insertion of a left central venous catheter with tip projected over the expected area of confluence of the brachiocephalic and superior vena cava. No pneumothorax. An enteric tube has been inserted with tip projected over the lower mediastinum consistent with location in the distal esophagus. Advancement is recommended if placement in the stomach is desired. Calcification of the aorta. IMPRESSION: 1. Cardiac enlargement. Bilateral perihilar infiltrates may indicate edema or aspiration. 2. Small bilateral pleural effusions. 3. Appliances positioned as described. Electronically Signed   By: Lucienne Capers M.D.   On: 04/01/2021 23:33       Narjis Mira M.D. [facility] 04/02/2021, 2:14 PM  Available via Epic secure chat 7am-7pm After 7 pm, please refer to night coverage provider listed on amion.

## 2021-04-02 NOTE — Progress Notes (Signed)
° ° °  OVERNIGHT PROGRESS REPORT  Notified by RN for concern of aspiration of tube feed as noticed by what appeared to be TF in the airway with coughing. Suctioning was conducted by RN.  TF stopped, Portable cxr ordered, findings inpart:    "FINDINGS: Cardiac enlargement. No vascular congestion. Bilateral perihilar infiltrates, similar to prior study, possibly aspiration or edema. Small bilateral pleural effusions. Bilateral calcified pleural plaques. There has been interval insertion of a left central venous catheter with tip projected over the expected area of confluence of the brachiocephalic and superior vena cava. No pneumothorax. An enteric tube has been inserted with tip projected over the lower mediastinum consistent with location in the distal esophagus. Advancement is recommended if placement in the stomach is desired. Calcification of the aorta.   IMPRESSION: 1. Cardiac enlargement. Bilateral perihilar infiltrates may indicate edema or aspiration. 2. Small bilateral pleural effusions. 3. Appliances positioned as described.     Electronically Signed   By: Lucienne Capers M.D.   On: 04/01/2021 23:33"     04/20/2021 Concluded in part:   "IMPRESSION: 1. Mild-to-moderate bilateral multilobar pneumonia. 2. Stable cardiomegaly. 3. Stable bilateral calcified pleural plaques compatible with previous asbestos exposure.     Electronically Signed   By: Claudie Revering M.D.   On: 04/21/2021 19:10  "    Patient is currently on Vancomycin, Cefepime, and Flagyl.   Gershon Cull MSNA MSN ACNPC-AG Acute Care Nurse Practitioner [place]

## 2021-04-02 NOTE — Progress Notes (Addendum)
Rn found pt in bed coughing to clear secretions. O2 increased to 4.5L for resp support. Pt breathing was unlabored. Cough: wet and strong. Pt HOB remains at 45degree angle with tube feeds off.

## 2021-04-02 NOTE — Progress Notes (Signed)
SLP Cancellation Note  Patient Details Name: Chad Russell MRN: [id number] DOB: Feb 03, 1927   Cancelled treatment:       Reason Eval/Treat Not Completed: Medical issues which prohibited therapy (note medical issues with pt being nearly somnolent and aspirating TF as well as reviewed palliative notes re: meeting with [name])  Kathleen Lime, [facility] SLP [place] Office [phone] Cell [phone]  Chad Russell 04/02/2021, 4:57 PM

## 2021-04-02 NOTE — TOC Progression Note (Signed)
Transition of Care [facility]) - Progression Note    Patient Details  Name: Chad Russell MRN: [id number] Date of Birth: 09-20-1926  [facility]) CM/SW Contact  Leeroy Cha, RN Phone Number: 04/02/2021, 8:13 AM  Clinical Narrative:    Chart and notes reviewed, no toc needs at this time.  Palliative care is following but no definite plans at this time.   Expected Discharge Plan: Home/Self Care Barriers to Discharge: Continued Medical Work up  Expected Discharge Plan and Services Expected Discharge Plan: Home/Self Care   Discharge Planning Services: CM Consult   Living arrangements for the past 2 months: Single Family Home                                       Social Determinants of Health (SDOH) Interventions    Readmission Risk Interventions No flowsheet data found.

## 2021-04-03 DIAGNOSIS — Z7189 Other specified counseling: Secondary | ICD-10-CM | POA: Diagnosis not present

## 2021-04-03 DIAGNOSIS — J189 Pneumonia, unspecified organism: Secondary | ICD-10-CM | POA: Diagnosis not present

## 2021-04-03 DIAGNOSIS — G9341 Metabolic encephalopathy: Secondary | ICD-10-CM | POA: Diagnosis not present

## 2021-04-03 DIAGNOSIS — R1310 Dysphagia, unspecified: Secondary | ICD-10-CM | POA: Diagnosis not present

## 2021-04-03 DIAGNOSIS — J9601 Acute respiratory failure with hypoxia: Secondary | ICD-10-CM | POA: Diagnosis not present

## 2021-04-03 LAB — CBC WITH DIFFERENTIAL/PLATELET
Abs Immature Granulocytes: 0.08 10*3/uL — ABNORMAL HIGH (ref 0.00–0.07)
Basophils Absolute: 0 10*3/uL (ref 0.0–0.1)
Basophils Relative: 0 %
Eosinophils Absolute: 0 10*3/uL (ref 0.0–0.5)
Eosinophils Relative: 0 %
HCT: 27.1 % — ABNORMAL LOW (ref 39.0–52.0)
Hemoglobin: 8.8 g/dL — ABNORMAL LOW (ref 13.0–17.0)
Immature Granulocytes: 1 %
Lymphocytes Relative: 2 %
Lymphs Abs: 0.3 10*3/uL — ABNORMAL LOW (ref 0.7–4.0)
MCH: 30.1 pg (ref 26.0–34.0)
MCHC: 32.5 g/dL (ref 30.0–36.0)
MCV: 92.8 fL (ref 80.0–100.0)
Monocytes Absolute: 0.4 10*3/uL (ref 0.1–1.0)
Monocytes Relative: 2 %
Neutro Abs: 16.7 10*3/uL — ABNORMAL HIGH (ref 1.7–7.7)
Neutrophils Relative %: 95 %
Platelets: 236 10*3/uL (ref 150–400)
RBC: 2.92 MIL/uL — ABNORMAL LOW (ref 4.22–5.81)
RDW: 19.1 % — ABNORMAL HIGH (ref 11.5–15.5)
WBC: 17.4 10*3/uL — ABNORMAL HIGH (ref 4.0–10.5)
nRBC: 0 % (ref 0.0–0.2)

## 2021-04-03 LAB — GLUCOSE, CAPILLARY
Glucose-Capillary: 103 mg/dL — ABNORMAL HIGH (ref 70–99)
Glucose-Capillary: 120 mg/dL — ABNORMAL HIGH (ref 70–99)
Glucose-Capillary: 121 mg/dL — ABNORMAL HIGH (ref 70–99)
Glucose-Capillary: 166 mg/dL — ABNORMAL HIGH (ref 70–99)

## 2021-04-03 LAB — COMPREHENSIVE METABOLIC PANEL
ALT: 21 U/L (ref 0–44)
AST: 21 U/L (ref 15–41)
Albumin: 2.6 g/dL — ABNORMAL LOW (ref 3.5–5.0)
Alkaline Phosphatase: 50 U/L (ref 38–126)
Anion gap: 4 — ABNORMAL LOW (ref 5–15)
BUN: 51 mg/dL — ABNORMAL HIGH (ref 8–23)
CO2: 27 mmol/L (ref 22–32)
Calcium: 8.2 mg/dL — ABNORMAL LOW (ref 8.9–10.3)
Chloride: 101 mmol/L (ref 98–111)
Creatinine, Ser: 0.88 mg/dL (ref 0.61–1.24)
GFR, Estimated: >60 mL/min (ref >60)
Glucose, Bld: 122 mg/dL — ABNORMAL HIGH (ref 70–99)
Potassium: 3.6 mmol/L (ref 3.5–5.1)
Sodium: 132 mmol/L — ABNORMAL LOW (ref 135–145)
Total Bilirubin: 0.6 mg/dL (ref 0.3–1.2)
Total Protein: 5 g/dL — ABNORMAL LOW (ref 6.5–8.1)

## 2021-04-03 MED ORDER — SODIUM CHLORIDE 0.9 % IV BOLUS
250.0000 mL | Freq: Once | INTRAVENOUS | Status: AC
  Administered 2021-04-03: 250 mL via INTRAVENOUS

## 2021-04-03 MED ORDER — MORPHINE SULFATE (PF) 2 MG/ML IV SOLN: 1.0000 mg | INTRAVENOUS | Status: DC | PRN

## 2021-04-03 MED ORDER — METHYLPREDNISOLONE SODIUM SUCC 40 MG IJ SOLR
40.0000 mg | INTRAMUSCULAR | Status: DC
  Administered 2021-04-04 – 2021-04-08 (×5): 40 mg via INTRAVENOUS
  Filled 2021-04-03 (×5): qty 1

## 2021-04-03 MED ORDER — SCOPOLAMINE 1 MG/3DAYS TD PT72
1.0000 | MEDICATED_PATCH | TRANSDERMAL | Status: DC
  Administered 2021-04-03 – 2021-04-24 (×8): 1.5 mg via TRANSDERMAL
  Filled 2021-04-03 (×9): qty 1

## 2021-04-03 MED ORDER — SODIUM CHLORIDE 0.9 % IV BOLUS: 500.0000 mL | Freq: Once | INTRAVENOUS | Status: DC

## 2021-04-03 MED ORDER — GLYCOPYRROLATE 0.2 MG/ML IJ SOLN
0.1000 mg | INTRAMUSCULAR | Status: DC | PRN
  Administered 2021-04-05 – 2021-04-07 (×2): 0.1 mg via INTRAVENOUS
  Filled 2021-04-03 (×2): qty 1

## 2021-04-03 MED ORDER — SODIUM CHLORIDE 0.9 % IV SOLN
3.0000 g | Freq: Four times a day (QID) | INTRAVENOUS | Status: AC
  Administered 2021-04-03 – 2021-04-09 (×26): 3 g via INTRAVENOUS
  Filled 2021-04-03 (×27): qty 8

## 2021-04-03 NOTE — Progress Notes (Signed)
Chaplain met with Chad Russell, Fate's wife, to offer support.  She understands that he cannot recover from this.  She feels that this could have been prevented, but that several compiling circumstances have brought them to this point.  She has friends in town who have been offering support, but she does not want to "burden them with her grief."  I encouraged her to receive support when it is offered without any feeling of "being a burden."  Her brother lives out of state, but will come for the funeral and there is no other family in town.  She asked for me to pray for her husband but did not want to pray together.  I provided listening and comfort as she shared.  [address], [place] Pager, [phone]

## 2021-04-03 NOTE — Progress Notes (Signed)
Daily Progress Note   Patient Name: Chad Russell       Date: 04/03/2021 DOB: 12-07-1926  Age: 88 y.o. MRN#: [id number] Attending Physician: Mendel Corning, MD Primary Care Physician: Garwin Brothers, MD Admit Date: 04/10/2021  Reason for Consultation/Follow-up: Establishing goals of care  Subjective: I met today with patient's wife.  Patient slept throughout my encounter.  I talked again with his wife about my concern that Chad Russell is not improving and still does not have source of nutrition or hydration as he aspirated on tube feeds and these have since been held.  She listened to my concerns but then stated that she feels that the care team has "written him off" and that he still has the opportunity to improve if he is given further antibiotics and to tube feeds are restarted.   Discussed my concerns about consideration for restarting tube feeds as he has aspirated on them already and he is even more compromised than he was previously with other episodes of recurrent aspiration.  She feels that he is "much stronger" than yesterday and I am not listening to her concerns.  She asked that I ask Dr. Tana Coast to discuss further with her today.  Length of Stay: 9  Current Medications: Scheduled Meds:   carbidopa-levodopa  1 tablet Oral QID   Chlorhexidine Gluconate Cloth  6 each Topical Daily   enoxaparin (LOVENOX) injection  75 mg Subcutaneous Q12H   feeding supplement (PROSource TF)  45 mL Per Tube BID   free water  150 mL Per Tube Q6H   Gerhardt's butt cream   Topical Daily   [START ON 04/04/2021] methylPREDNISolone (SOLU-MEDROL) injection  40 mg Intravenous Q24H   nutrition supplement (JUVEN)  1 packet Oral BID BM   polyethylene glycol  17 g Oral BID   scopolamine  1 patch Transdermal Q72H    senna-docusate  2 tablet Oral BID   sodium phosphate  1 enema Rectal Once   timolol  1 drop Both Eyes QHS    Continuous Infusions:  ampicillin-sulbactam (UNASYN) IV 3 g (04/03/21 1500)   feeding supplement (OSMOLITE 1.5 CAL) Stopped (04/01/21 2230)    PRN Meds: acetaminophen, atropine, bisacodyl, glycopyrrolate, HYDROcodone-acetaminophen, lidocaine (PF), morphine injection, ondansetron **OR** ondansetron (ZOFRAN) IV, sodium chloride flush  Physical Exam  Patient appears pale and weak Patient appears chronically ill Has NG tube Has coarse breath sounds Does not not have edema Has muscle wasting Does not wake or speak to me today  Vital Signs: BP (!) 91/44 (BP Location: Right Arm)    Pulse 64    Temp 97.8 F (36.6 C) (Oral)    Resp 20    Ht _0  (1.854 m)    Wt 79.8 kg    SpO2 94%    BMI 23.22 kg/m  SpO2: SpO2: 94 % O2 Device: O2 Device: NRB O2 Flow Rate: O2 Flow Rate (L/min): 15 L/min  Intake/output summary:  Intake/Output Summary (Last 24 hours) at 04/03/2021 1631 Last data filed at 04/03/2021 0600 Gross per 24 hour  Intake 519.64 ml  Output 625 ml  Net -105.36 ml    LBM: Last BM Date: 04/02/21 Baseline Weight: Weight: 74.8 kg Most recent weight: Weight: 79.8 kg       Palliative Assessment/Data:      Patient Active Problem List   Diagnosis Date Noted   Dysphagia 03/27/2021   Protein-calorie malnutrition, severe 03/27/2021   Lung nodule 03/26/2021   Constipation 03/27/2021   Sacral decubitus ulcer - stage 4 04/08/2021   Acute respiratory failure with hypoxia ([place]) 04/02/2021   Pressure injury of skin 02/10/2021   Generalized weakness 02/07/2021   Dehydration 02/07/2021   Acute prerenal azotemia 02/07/2021   Parkinson disease ([place])    Protein calorie malnutrition ([place])    Multifocal pneumonia 02/06/2021   UTI (urinary tract infection) 10/14/2020   Bradycardia 10/14/2020   Educated about COVID-19 virus infection 11/29/2019   Exudative age-related  macular degeneration of left eye with active choroidal neovascularization ([place]) 10/02/2019   Central retinal vein occlusion with macular edema of right eye 06/20/2019   Central retinal vein occlusion with neovascularization of right eye 06/20/2019   Ocular ischemic syndrome 06/20/2019   Retinal hemorrhage of left eye 06/20/2019   Retinal hemorrhage of right eye 06/20/2019   AKI (acute kidney injury) ([place]) 12/15/2018   Hematoma of scalp    Heart block    Chest pain 05/04/2018   Fracture of femoral neck, left ([place]) 04/19/2017   MGUS (monoclonal gammopathy of unknown significance) 01/15/2017   BPH with obstruction/lower urinary tract symptoms 09/07/2016   Goals of care, counseling/discussion 08/24/2016   Urinary retention 05/13/2016   Balance problem 10/31/2013   OSA (obstructive sleep apnea) 10/31/2013   BPH (benign prostatic hyperplasia) 08/28/2012   Atrial fibrillation ([place]) 06/08/2012   Hyperplasia of prostate with lower urinary tract symptoms (LUTS) 04/28/2012   Moderate malnutrition ([place]) 04/28/2012   Infection of urinary tract - recurrent 04/26/2012   NSTEMI (non-ST elevated myocardial infarction) ([place]) 03/15/2011   Complete heart block ([place]) 03/15/2011   HTN (hypertension) 03/15/2011    Palliative Care Assessment & Plan   Patient Profile:    Assessment: Multifocal pneumonia present on admission COVID-19 virus infection present on admission Acute hypoxic respiratory failure Sacral decubitus ulcer present on admission History of lung nodule History of Parkinson's disease. Patient with recent admission in December 2022 for pneumonia, progressive functional decline since October 22 from mobility standpoint, was recently at [place] care and was noted to have been bedbound, found to have stage IV pressure sacral pressure ulcer with a difficulty in healing.  Recommendations/Plan: DNR/DNI Patient's wife remains hopeful for his recovery and was frustrated with my  assessment that I did not think he was going to survive this hospitalization.  She requested to speak further  with primary attending.  I notified Dr. Tana Coast who will discuss with her today.  Code Status:    Code Status Orders  (From admission, onward)           Start     Ordered   04/21/2021 2126  Full code  Continuous        04/08/2021 2126           Code Status History     Date Active Date Inactive Code Status Order ID Comments User Context   02/06/2021 2006 02/14/2021 0624 Full Code [id number]  Rhetta Mura, DO ED   10/14/2020 2134 10/21/2020 1634 Full Code [id number]  Shela Leff, MD ED   12/14/2018 1947 12/26/2018 2232 Full Code [id number]  Orene Desanctis, DO ED   04/19/2017 2321 04/23/2017 0035 Full Code [id number]  Marchia Bond, MD Inpatient   01/17/2017 1142 01/17/2017 1904 Full Code [id number]  Angelia Mould, MD Inpatient   09/07/2016 1207 09/09/2016 1657 Full Code [id number]  Irine Seal, MD Inpatient   04/26/2012 2134 04/29/2012 1720 Full Code [id number]  Gerda Diss, DO Inpatient   03/15/2011 2127 03/17/2011 1522 Full Code [id number]  [place], Earney Navy, MD ED       Prognosis:  Poor.  I believe he is going to continue to decline regardless of interventions moving forward.  Discharge Planning: To Be Determined- I think there is a high likelihood that this will be a terminal admission  Care plan was discussed with  wife at bedside.   Total time: 50 minutes  Micheline Rough, MD  Please contact Palliative Medicine Team phone at [phone] for questions and concerns.

## 2021-04-03 NOTE — Plan of Care (Signed)
  Problem: Education: Goal: Knowledge of General Education information will improve Description: Including pain rating scale, medication(s)/side effects and non-pharmacologic comfort measures Outcome: Progressing   Problem: Health Behavior/Discharge Planning: Goal: Ability to manage health-related needs will improve Outcome: Progressing   Problem: Clinical Measurements: Goal: Will remain free from infection Outcome: Progressing   

## 2021-04-03 NOTE — Progress Notes (Signed)
pt BP 91/44 with MAP of 59. (this is the 2nd BP check)... previous 1 was 88/42 (56).   Pt responsive, just a little slow to respond. His CXR from 2/8 resulted edema vs aspiration after he vomited  On call APP [place] NP notified. 254ml NS bolus ordered.

## 2021-04-03 NOTE — Progress Notes (Signed)
[facility]                                                                               Britain Saber, is a 88 y.o. male, DOB - May 07, 1926, VQQ:[id number] Admit date - 04/02/2021    Outpatient Primary MD for the patient is Garwin Brothers, MD  LOS - 9  days    Brief summary   Mr. Flatt is a 88  yo male with PMH PAF, BPH, CAD, CHB, HTN, Parkinson's with dementia, OSA, arthritis who presented to the ER with worsening mentation.  He was recently admitted in December 2022 for PNA (negative covid at that time) due to presumed aspiration.  He has been less mobile since October 2022 with significant progressive decline from a mobility standpoint.  He has been residing at [facility] since discharge on 02/13/21.  He has been bedbound for several weeks since that time as well.  On workup he was reported to have increased confusion compared to baseline and associated fever.  He was found to be covid positive on workup and was started on Remdesivir. He also has a stage 4 sacral pressure ulcer that has had difficulty healing.    Assessment & Plan    Assessment and Plan: * Multifocal pneumonia- (present on admission) - Differential includes ongoing aspiration from underlying advanced Parkinson's disease/dementia versus contribution from COVID-pneumonia - given MRSA positive swab, antibiotics were changed to IV vancomycin, cefepime, Flagyl for 7 days. -PICC line placed on 2/7 by IR -Cultures negative so far -Will place on IV Unasyn, reassess on Monday  Protein-calorie malnutrition, severe - On tube feeds  Dysphagia - Underwent SLP eval on 03/26/2021 with overt signs of dysphagia and risk for aspiration. -Unclear long-term plan, needs ongoing palliative GOC, wife hopeful for meaningful recovery -Will resume tube feeds at 20 cc an hour and increase 10 cc every 4hrs to go with free water.  He remains a high aspiration risk, explained to the patient's wife in detail and she  understands that if patient has another aspiration event, tube feeds will be discontinued.   Lung nodule - Multiple lung nodules noted on CT chest (RUL, RLL, LUL, LLL). Also has R>L pleural effusion.  Differential for effusion would include parapneumonic versus malignant -Regardless, given his extremely frail state, poor functional status, underlying dementia he would be a extremely poor treatment candidate -Dr Sabino Gasser updated wife on lung nodules on 2/4; per documentation she "already knew about them" and if "he never got cancer in the past he probably doesn't have it now" - per radiology fluid collection in right pleura not enough for thoracentesis at this time  Acute respiratory failure with hypoxia ([place])- (present on admission) - Worsening hypoxia due to aspiration, PNA, COVID, severe deconditioning -Continue O2 via NRB and wean as tolerated  Sacral decubitus ulcer - stage 4- (present on admission) - No cellulitis or osteomyelitis on CT - follow wound care consult recommendations   Constipation- (present on admission) - s/p enema in ER -continue Dulcolax, Senokot-S  Parkinson disease ([place])- (present on admission) - Advanced PD with dementia, significant cerebral atrophy  on CT head as well -Sinemet was increased to 4 times daily (  was adjusted to QID from BID due to tremors) -Continue Sinemet via core track  Goals of care, counseling/discussion - Overall poor prognosis and functional status with high risk of aspiration and decompensation.  DNR/DNI. -I discussed in detail with patient's wife who feels that pneumonia did not get adequately treated when he lost IV access.  Patient also has high risk of aspiration, tube feeds were placed on hold due to aspiration/vomiting event.  Patient's wife requested antibiotics, tube feeds and hydration over the weekend and hopefully he can go to SNF.  I did mention it to her that patient will not be able to go to SNF with a core track and PEG tube  would not be appropriate with his age, advanced Parkinson's, deconditioning and aspiration risk.  Patient's wife was agreeable to continue current management including antibiotics, tube feeds, free water, frequent turning over the weekend and reassess on Monday.  If no significant improvement she is agreeable to then possibly pursue comfort care at that point.   Atrial fibrillation ([place])- (present on admission) - Holding Xarelto while NPO. - continue lovenox for now  HTN (hypertension)- (present on admission) - BP soft, hold lisinopril   COVID-19 virus infection-resolved as of 03/30/2021, (present on admission) - continue remdesivir x 5 days (completed on 2/5) - CT value 33 on admission  - fax received from [facility] that his covid test was positive on 03/01/21 and he completed isolation as well; will d/c isolation at this time   Acute metabolic encephalopathy-resolved as of 03/27/2021, (present on admission) Delirium due to PNA / COVID on top of chronic parkinson's dementia.   Code Status: DNR/DNI DVT Prophylaxis:    Lovenox Level of Care: Level of care: Progressive Family Communication: Discussed in detail with patient's wife, see above  Disposition Plan:     Remains inpatient appropriate:    Procedures:  None  Consultants:   Palliative medicine  Antimicrobials:  IV vancomycin IV cefepime IV Flagyl Completed remdesivir on 2/5 Starting Unasyn 2/10--->  Medications  Scheduled Meds:  carbidopa-levodopa  1 tablet Oral QID   Chlorhexidine Gluconate Cloth  6 each Topical Daily   enoxaparin (LOVENOX) injection  75 mg Subcutaneous Q12H   feeding supplement (PROSource TF)  45 mL Per Tube BID   free water  150 mL Per Tube Q6H   Gerhardt's butt cream   Topical Daily   [START ON 04/04/2021] methylPREDNISolone (SOLU-MEDROL) injection  40 mg Intravenous Q24H   nutrition supplement (JUVEN)  1 packet Oral BID BM   polyethylene glycol  17 g Oral BID   scopolamine  1 patch  Transdermal Q72H   senna-docusate  2 tablet Oral BID   sodium phosphate  1 enema Rectal Once   timolol  1 drop Both Eyes QHS   Continuous Infusions:  feeding supplement (OSMOLITE 1.5 CAL) Stopped (04/01/21 2230)   PRN Meds:.acetaminophen, atropine, bisacodyl, glycopyrrolate, HYDROcodone-acetaminophen, lidocaine (PF), morphine injection, ondansetron **OR** ondansetron (ZOFRAN) IV, sodium chloride flush    Subjective:   Chad Russell was seen and examined today.  Somewhat alert and awake, oriented to self but no significant improvement.  BP remains low.  No fevers or chills.  Objective:   Vitals:   04/02/21 2059 04/03/21 0511 04/03/21 0600 04/03/21 0800  BP: (!) 90/50 (!) 88/42 (!) 91/44   Pulse: (!) 58 66 64   Resp: (!) 24 (!) 22  20  Temp: 99.4 F (37.4 C) 97.9 F (36.6 C) 97.8 F (36.6 C)   TempSrc: Oral  Oral Oral   SpO2:  94%    Weight:      Height:        Intake/Output Summary (Last 24 hours) at 04/03/2021 1247 Last data filed at 04/03/2021 0600 Gross per 24 hour  Intake 619.64 ml  Output 925 ml  Net -305.36 ml   Filed Weights   03/31/21 0500 04/01/21 0500 04/02/21 0610  Weight: 79.4 kg 80.7 kg 79.8 kg     Exam General: Alert and oriented x self, frail and ill-appearing Cardiovascular: S1 S2 auscultated, RRR Respiratory: Coarse breath sounds bilaterally Gastrointestinal: Soft, nontender, nondistended, + bowel sounds Ext: no pedal edema bilaterally Neuro: does not follow commands  Data Reviewed:  I have personally reviewed following labs and imaging studies   CBC Lab Results  Component Value Date   WBC 17.4 (H) 04/03/2021   RBC 2.92 (L) 04/03/2021   HGB 8.8 (L) 04/03/2021   HCT 27.1 (L) 04/03/2021   MCV 92.8 04/03/2021   MCH 30.1 04/03/2021   PLT 236 04/03/2021   MCHC 32.5 04/03/2021   RDW 19.1 (H) 04/03/2021   LYMPHSABS 0.3 (L) 04/03/2021   MONOABS 0.4 04/03/2021   EOSABS 0.0 04/03/2021   BASOSABS 0.0 82/95/6213     Last metabolic  panel Lab Results  Component Value Date   NA 132 (L) 04/03/2021   K 3.6 04/03/2021   CL 101 04/03/2021   CO2 27 04/03/2021   BUN 51 (H) 04/03/2021   CREATININE 0.88 04/03/2021   GLUCOSE 122 (H) 04/03/2021   GFRNONAA >60 04/03/2021   GFRAA >60 12/24/2018   CALCIUM 8.2 (L) 04/03/2021   PHOS 3.1 04/01/2021   PROT 5.0 (L) 04/03/2021   ALBUMIN 2.6 (L) 04/03/2021   LABGLOB 2.8 01/04/2017   AGRATIO 1.7 11/09/2016   BILITOT 0.6 04/03/2021   ALKPHOS 50 04/03/2021   AST 21 04/03/2021   ALT 21 04/03/2021   ANIONGAP 4 (L) 04/03/2021    CBG (last 3)  Recent Labs    04/03/21 0041 04/03/21 0512 04/03/21 0800  GLUCAP 103* 120* 121*      Coagulation Profile: No results for input(s): INR, PROTIME in the last 168 hours.   Radiology Studies: DG CHEST PORT 1 VIEW  Result Date: 04/01/2021 CLINICAL DATA:  Aspiration into airway. EXAM: PORTABLE CHEST 1 VIEW COMPARISON:  04/05/2021 FINDINGS: Cardiac enlargement. No vascular congestion. Bilateral perihilar infiltrates, similar to prior study, possibly aspiration or edema. Small bilateral pleural effusions. Bilateral calcified pleural plaques. There has been interval insertion of a left central venous catheter with tip projected over the expected area of confluence of the brachiocephalic and superior vena cava. No pneumothorax. An enteric tube has been inserted with tip projected over the lower mediastinum consistent with location in the distal esophagus. Advancement is recommended if placement in the stomach is desired. Calcification of the aorta. IMPRESSION: 1. Cardiac enlargement. Bilateral perihilar infiltrates may indicate edema or aspiration. 2. Small bilateral pleural effusions. 3. Appliances positioned as described. Electronically Signed   By: Lucienne Capers M.D.   On: 04/01/2021 23:33       Keamber Macfadden M.D. [facility] 04/03/2021, 12:47 PM  Available via Epic secure chat 7am-7pm After 7 pm, please refer to night coverage  provider listed on amion.

## 2021-04-03 NOTE — Progress Notes (Signed)
SLP DC Note  Patient Details Name: Chad Russell MRN: [id number] DOB: Jul 05, 1926   Cancelled treatment:       Reason Eval/Treat Not Completed: Medical issues which prohibited therapy (Pt continues with medical decline, At this time SLP treatment is not indicated. Please reorder if pt medically improves.  SLP contacted palliative regarding plan and MD in agreement with SLP discharge. Thanks.) Kathleen Lime, MS [facility] SLP Acute Rehab Services Office [phone] Cell [phone]   Macario Golds 04/03/2021, 10:29 AM

## 2021-04-03 NOTE — Progress Notes (Signed)
Pharmacy Antibiotic Note  Chad Russell is a 88 y.o. male ordered Unasyn for aspiration PNA  Plan: Unasyn 3 g iv q 6 hours  Will sign off and follow remotely  Height: 6\' 1"  (185.4 cm) Weight: 79.8 kg (176 lb) IBW/kg (Calculated) : 79.9  Temp (24hrs), Avg:98.4 F (36.9 C), Min:97.8 F (36.6 C), Max:99.4 F (37.4 C)  Recent Labs  Lab 03/30/21 0711 03/31/21 0411 04/01/21 0425 04/02/21 0418 04/03/21 0446  WBC 8.4 9.5 15.9* 19.6* 17.4*  CREATININE 0.52* 0.60* 0.62 0.51* 0.88    Estimated Creatinine Clearance: 60.5 mL/min (by C-G formula based on SCr of 0.88 mg/dL).    Allergies  Allergen Reactions   Doxycycline Other (See Comments)    Upset stomach   Keflex [Cephalexin] Other (See Comments)    Abdominal discomfort 04-19-17 Pt has tolerated orally; tolerated IV Rocephin    Thank you for allowing pharmacy to be a part of this patients care.  Ulice Dash D 04/03/2021 1:17 PM

## 2021-04-04 ENCOUNTER — Inpatient Hospital Stay (HOSPITAL_COMMUNITY): Payer: Medicare HMO

## 2021-04-04 DIAGNOSIS — J189 Pneumonia, unspecified organism: Secondary | ICD-10-CM | POA: Diagnosis not present

## 2021-04-04 DIAGNOSIS — J9601 Acute respiratory failure with hypoxia: Secondary | ICD-10-CM | POA: Diagnosis not present

## 2021-04-04 DIAGNOSIS — G9341 Metabolic encephalopathy: Secondary | ICD-10-CM | POA: Diagnosis not present

## 2021-04-04 LAB — COMPREHENSIVE METABOLIC PANEL
ALT: 25 U/L (ref 0–44)
AST: 22 U/L (ref 15–41)
Albumin: 2.9 g/dL — ABNORMAL LOW (ref 3.5–5.0)
Alkaline Phosphatase: 64 U/L (ref 38–126)
Anion gap: 7 (ref 5–15)
BUN: 48 mg/dL — ABNORMAL HIGH (ref 8–23)
CO2: 26 mmol/L (ref 22–32)
Calcium: 8.9 mg/dL (ref 8.9–10.3)
Chloride: 103 mmol/L (ref 98–111)
Creatinine, Ser: 0.69 mg/dL (ref 0.61–1.24)
GFR, Estimated: >60 mL/min (ref >60)
Glucose, Bld: 139 mg/dL — ABNORMAL HIGH (ref 70–99)
Potassium: 3.9 mmol/L (ref 3.5–5.1)
Sodium: 136 mmol/L (ref 135–145)
Total Bilirubin: 0.7 mg/dL (ref 0.3–1.2)
Total Protein: 5.7 g/dL — ABNORMAL LOW (ref 6.5–8.1)

## 2021-04-04 LAB — CBC WITH DIFFERENTIAL/PLATELET
Abs Immature Granulocytes: 0.09 10*3/uL — ABNORMAL HIGH (ref 0.00–0.07)
Basophils Absolute: 0 10*3/uL (ref 0.0–0.1)
Basophils Relative: 0 %
Eosinophils Absolute: 0 10*3/uL (ref 0.0–0.5)
Eosinophils Relative: 0 %
HCT: 31.7 % — ABNORMAL LOW (ref 39.0–52.0)
Hemoglobin: 10.5 g/dL — ABNORMAL LOW (ref 13.0–17.0)
Immature Granulocytes: 1 %
Lymphocytes Relative: 2 %
Lymphs Abs: 0.3 10*3/uL — ABNORMAL LOW (ref 0.7–4.0)
MCH: 29.9 pg (ref 26.0–34.0)
MCHC: 33.1 g/dL (ref 30.0–36.0)
MCV: 90.3 fL (ref 80.0–100.0)
Monocytes Absolute: 0.4 10*3/uL (ref 0.1–1.0)
Monocytes Relative: 2 %
Neutro Abs: 17.3 10*3/uL — ABNORMAL HIGH (ref 1.7–7.7)
Neutrophils Relative %: 95 %
Platelets: 282 10*3/uL (ref 150–400)
RBC: 3.51 MIL/uL — ABNORMAL LOW (ref 4.22–5.81)
RDW: 19.9 % — ABNORMAL HIGH (ref 11.5–15.5)
WBC: 18.1 10*3/uL — ABNORMAL HIGH (ref 4.0–10.5)
nRBC: 0 % (ref 0.0–0.2)

## 2021-04-04 LAB — GLUCOSE, CAPILLARY
Glucose-Capillary: 119 mg/dL — ABNORMAL HIGH (ref 70–99)
Glucose-Capillary: 129 mg/dL — ABNORMAL HIGH (ref 70–99)
Glucose-Capillary: 129 mg/dL — ABNORMAL HIGH (ref 70–99)
Glucose-Capillary: 133 mg/dL — ABNORMAL HIGH (ref 70–99)
Glucose-Capillary: 147 mg/dL — ABNORMAL HIGH (ref 70–99)

## 2021-04-04 NOTE — Progress Notes (Signed)
[facility]                                                                               Hersel Mcmeen, is a 88 y.o. male, DOB - 02-01-1927, YBO:[id number] Admit date - 03/26/2021    Outpatient Primary MD for the patient is Garwin Brothers, MD  LOS - 10  days    Brief summary   Mr. Turano is a 88  yo male with PMH PAF, BPH, CAD, CHB, HTN, Parkinson's with dementia, OSA, arthritis who presented to the ER with worsening mentation.  He was recently admitted in December 2022 for PNA (negative covid at that time) due to presumed aspiration.  He has been less mobile since October 2022 with significant progressive decline from a mobility standpoint.  He has been residing at [facility] since discharge on 02/13/21.  He has been bedbound for several weeks since that time as well.  On workup he was reported to have increased confusion compared to baseline and associated fever.  He was found to be covid positive on workup and was started on Remdesivir. He also has a stage 4 sacral pressure ulcer that has had difficulty healing.    Assessment & Plan    Assessment and Plan: * Multifocal pneumonia- (present on admission) - Differential includes ongoing aspiration from underlying advanced Parkinson's disease/dementia versus contribution from COVID-pneumonia - given MRSA positive swab, antibiotics were changed to IV vancomycin, cefepime, Flagyl for 7 days. -PICC line placed on 2/7 by IR -Cultures negative so far.  Overnight spiking fevers of 102.6 degrees currently.  Continue IV Unasyn, obtain blood cultures  Protein-calorie malnutrition, severe- (present on admission) - On tube feeds  Dysphagia - Underwent SLP eval on 03/26/2021 with overt signs of dysphagia and risk for aspiration. -Unclear long-term plan, needs ongoing palliative GOC, wife hopeful for meaningful recovery -Continue tube feeds and increase 10 cc every 4hrs with free water.  He remains a high aspiration risk,  explained to the patient's wife in detail and she understands that if patient has another aspiration event, tube feeds will be discontinued.   Lung nodule - Multiple lung nodules noted on CT chest (RUL, RLL, LUL, LLL). Also has R>L pleural effusion.  Differential for effusion would include parapneumonic versus malignant -Regardless, given his extremely frail state, poor functional status, underlying dementia he would be a extremely poor treatment candidate -Dr Sabino Gasser updated wife on lung nodules on 2/4; per documentation she "already knew about them" and if "he never got cancer in the past he probably doesn't have it now" - per radiology fluid collection in right pleura not enough for thoracentesis at this time  Acute respiratory failure with hypoxia ([place])- (present on admission) - Worsening hypoxia due to aspiration, PNA, COVID, severe deconditioning -Continue O2 via NRB and wean as tolerated  Sacral decubitus ulcer - stage 4- (present on admission) - No cellulitis or osteomyelitis on CT - follow wound care consult recommendations   Constipation- (present on admission) - s/p enema in ER -continue Dulcolax, Senokot-S  Parkinson disease ([place])- (present on admission) - Advanced PD with dementia, significant cerebral atrophy  on CT head as well -Sinemet was increased to 4  times daily (was adjusted to QID from BID due to tremors) -Continue Sinemet via core track  Goals of care, counseling/discussion - Overall poor prognosis and functional status with high risk of aspiration and decompensation.  DNR/DNI. -GOC discussed with patient's wife on 2/10, see note - per wife's request, placed back on tube feeds, increase to 40 cc an hour and then to goal rate as tolerated.  She understands the high risk of aspiration.   -Continue IV Unasyn - If no significant improvement in next 24 to 48 hours, she is agreeable to then possibly pursue comfort care at that point.   Atrial fibrillation ([place])-  (present on admission) - Holding Xarelto while NPO. - continue lovenox for now  HTN (hypertension)- (present on admission) - BP soft, continue to hold antihypertensives   COVID-19 virus infection-resolved as of 03/30/2021, (present on admission) - continue remdesivir x 5 days (completed on 2/5) - CT value 33 on admission  - fax received from [facility] that his covid test was positive on 03/01/21 and he completed isolation as well; will d/c isolation at this time   Acute metabolic encephalopathy-resolved as of 03/27/2021, (present on admission) Delirium due to PNA / COVID on top of chronic parkinson's dementia.    Code Status: DNR/DNI DVT Prophylaxis:    Lovenox  Level of Care: Level of care: Progressive Family Communication: Updated patient's wife yesterday at bedside  Disposition Plan:     Remains inpatient appropriate:     Procedures:  None  Consultants:   Palliative medicine   Antimicrobials:  IV vancomycin IV cefepime IV Flagyl Completed remdesivir on 2/5 Starting Unasyn 2/10--->    Antimicrobials:   Anti-infectives (From admission, onward)    Start     Dose/Rate Route Frequency Ordered Stop   04/03/21 1430  Ampicillin-Sulbactam (UNASYN) 3 g in sodium chloride 0.9 % 100 mL IVPB        3 g 200 mL/hr over 30 Minutes Intravenous Every 6 hours 04/03/21 1315     03/27/21 1500  vancomycin (VANCOREADY) IVPB 1500 mg/300 mL        1,500 mg 150 mL/hr over 120 Minutes Intravenous Every 24 hours 03/27/21 1351 04/02/21 2359   03/27/21 1445  ceFEPIme (MAXIPIME) 2 g in sodium chloride 0.9 % 100 mL IVPB        2 g 200 mL/hr over 30 Minutes Intravenous Every 8 hours 03/27/21 1345 04/02/21 2359   03/27/21 1445  metroNIDAZOLE (FLAGYL) IVPB 500 mg        500 mg 100 mL/hr over 60 Minutes Intravenous Every 12 hours 03/27/21 1345 04/02/21 2359   03/26/21 1000  remdesivir 100 mg in sodium chloride 0.9 % 100 mL IVPB  Status:  Discontinued       See Hyperspace for full  Linked Orders Report.   100 mg 200 mL/hr over 30 Minutes Intravenous Daily 04/18/2021 2126 04/19/2021 2137   03/26/21 1000  remdesivir 100 mg in sodium chloride 0.9 % 100 mL IVPB        100 mg 200 mL/hr over 30 Minutes Intravenous Daily 03/31/2021 2133 03/29/21 1254   03/26/21 0000  Ampicillin-Sulbactam (UNASYN) 3 g in sodium chloride 0.9 % 100 mL IVPB  Status:  Discontinued        3 g 200 mL/hr over 30 Minutes Intravenous Every 6 hours 03/30/2021 2130 03/27/21 1342   04/13/2021 2230  remdesivir 200 mg in sodium chloride 0.9% 250 mL IVPB        200 mg  580 mL/hr over 30 Minutes Intravenous Once 04/20/2021 2133 04/19/2021 2336   04/11/2021 2130  remdesivir 200 mg in sodium chloride 0.9% 250 mL IVPB  Status:  Discontinued       See Hyperspace for full Linked Orders Report.   200 mg 580 mL/hr over 30 Minutes Intravenous Once 04/18/2021 2126 04/14/2021 2137   04/19/2021 1800  vancomycin (VANCOCIN) IVPB 1000 mg/200 mL premix  Status:  Discontinued        1,000 mg 200 mL/hr over 60 Minutes Intravenous  Once 03/27/2021 1749 04/21/2021 1753   04/07/2021 1800  aztreonam (AZACTAM) 2 g in sodium chloride 0.9 % 100 mL IVPB  Status:  Discontinued        2 g 200 mL/hr over 30 Minutes Intravenous  Once 04/15/2021 1749 04/04/2021 1750   04/21/2021 1800  ceFEPIme (MAXIPIME) 2 g in sodium chloride 0.9 % 100 mL IVPB        2 g 200 mL/hr over 30 Minutes Intravenous  Once 03/30/2021 1753 03/31/2021 1852   03/31/2021 1800  vancomycin (VANCOREADY) IVPB 1500 mg/300 mL        1,500 mg 150 mL/hr over 120 Minutes Intravenous  Once 04/09/2021 1753 04/18/2021 2114        Medications  Scheduled Meds:  carbidopa-levodopa  1 tablet Oral QID   Chlorhexidine Gluconate Cloth  6 each Topical Daily   enoxaparin (LOVENOX) injection  75 mg Subcutaneous Q12H   feeding supplement (PROSource TF)  45 mL Per Tube BID   free water  150 mL Per Tube Q6H   Gerhardt's butt cream   Topical Daily   methylPREDNISolone (SOLU-MEDROL) injection  40 mg Intravenous Q24H    nutrition supplement (JUVEN)  1 packet Oral BID BM   polyethylene glycol  17 g Oral BID   scopolamine  1 patch Transdermal Q72H   senna-docusate  2 tablet Oral BID   sodium phosphate  1 enema Rectal Once   timolol  1 drop Both Eyes QHS   Continuous Infusions:  ampicillin-sulbactam (UNASYN) IV 3 g (04/04/21 0818)   feeding supplement (OSMOLITE 1.5 CAL) 1,000 mL (04/03/21 2203)   PRN Meds:.acetaminophen, atropine, bisacodyl, glycopyrrolate, HYDROcodone-acetaminophen, lidocaine (PF), morphine injection, ondansetron **OR** ondansetron (ZOFRAN) IV, sodium chloride flush    Subjective:   Nellie Chevalier was seen and examined today.  Spiking fevers overnight.  Somnolent, difficult to obtain review of system from the patient.  Tolerating tube feeds at 20 cc an hour  Objective:   Vitals:   04/03/21 2028 04/04/21 0354 04/04/21 0506 04/04/21 0800  BP: (!) 120/47 (!) 114/93    Pulse: 78 (!) 57    Resp: 20 20  20   Temp: 97.9 F (36.6 C) (!) 102.6 F (39.2 C) (!) 100.6 F (38.1 C)   TempSrc: Oral Axillary Axillary   SpO2: 95% 100%    Weight:      Height:        Intake/Output Summary (Last 24 hours) at 04/04/2021 1057 Last data filed at 04/04/2021 0413 Gross per 24 hour  Intake 299 ml  Output 400 ml  Net -101 ml   Filed Weights   03/31/21 0500 04/01/21 0500 04/02/21 0610  Weight: 79.4 kg 80.7 kg 79.8 kg     Exam General: Somnolent, Cardiovascular: S1 S2 auscultated, no murmurs, RRR Respiratory: Diminished breath sounds Gastrointestinal: Soft, nontender, nondistended, + bowel sounds Ext: no pedal edema bilaterally Neuro: not following commands   Data Reviewed:  I have personally reviewed following labs and imaging studies  CBC Lab Results  Component Value Date   WBC 18.1 (H) 04/04/2021   RBC 3.51 (L) 04/04/2021   HGB 10.5 (L) 04/04/2021   HCT 31.7 (L) 04/04/2021   MCV 90.3 04/04/2021   MCH 29.9 04/04/2021   PLT 282 04/04/2021   MCHC 33.1 04/04/2021   RDW 19.9  (H) 04/04/2021   LYMPHSABS 0.3 (L) 04/04/2021   MONOABS 0.4 04/04/2021   EOSABS 0.0 04/04/2021   BASOSABS 0.0 17/61/6073     Last metabolic panel Lab Results  Component Value Date   NA 136 04/04/2021   K 3.9 04/04/2021   CL 103 04/04/2021   CO2 26 04/04/2021   BUN 48 (H) 04/04/2021   CREATININE 0.69 04/04/2021   GLUCOSE 139 (H) 04/04/2021   GFRNONAA >60 04/04/2021   GFRAA >60 12/24/2018   CALCIUM 8.9 04/04/2021   PHOS 3.1 04/01/2021   PROT 5.7 (L) 04/04/2021   ALBUMIN 2.9 (L) 04/04/2021   LABGLOB 2.8 01/04/2017   AGRATIO 1.7 11/09/2016   BILITOT 0.7 04/04/2021   ALKPHOS 64 04/04/2021   AST 22 04/04/2021   ALT 25 04/04/2021   ANIONGAP 7 04/04/2021    CBG (last 3)  Recent Labs    04/04/21 0014 04/04/21 0356 04/04/21 [place]         Lorra Freeman M.D. [facility] 04/04/2021, 10:57 AM  Available via Epic secure chat 7am-7pm After 7 pm, please refer to night coverage provider listed on amion.

## 2021-04-04 NOTE — Progress Notes (Addendum)
Patients temperature was 100.6 F. Will continue with ice packs and no bulky warm blankets to continue to lower patients temperature. MD notified.

## 2021-04-04 NOTE — Progress Notes (Signed)
On my initial assessment cortrak external length is 72cm but last documented length is 21cm on 2/11. Tube feeding held and provider notified.

## 2021-04-04 NOTE — Progress Notes (Signed)
Pts temperature was 102.6 F. Treated per MAR. Ice packs placed at patients sides and bulky warm blankets removed. MD notified. Will recheck patients temperature.

## 2021-04-05 DIAGNOSIS — J9601 Acute respiratory failure with hypoxia: Secondary | ICD-10-CM | POA: Diagnosis not present

## 2021-04-05 DIAGNOSIS — J189 Pneumonia, unspecified organism: Secondary | ICD-10-CM | POA: Diagnosis not present

## 2021-04-05 DIAGNOSIS — U071 COVID-19: Secondary | ICD-10-CM | POA: Diagnosis not present

## 2021-04-05 DIAGNOSIS — G9341 Metabolic encephalopathy: Secondary | ICD-10-CM | POA: Diagnosis not present

## 2021-04-05 LAB — GLUCOSE, CAPILLARY
Glucose-Capillary: 109 mg/dL — ABNORMAL HIGH (ref 70–99)
Glucose-Capillary: 122 mg/dL — ABNORMAL HIGH (ref 70–99)
Glucose-Capillary: 124 mg/dL — ABNORMAL HIGH (ref 70–99)
Glucose-Capillary: 139 mg/dL — ABNORMAL HIGH (ref 70–99)
Glucose-Capillary: 150 mg/dL — ABNORMAL HIGH (ref 70–99)
Glucose-Capillary: 160 mg/dL — ABNORMAL HIGH (ref 70–99)

## 2021-04-05 NOTE — Plan of Care (Signed)
°  Problem: Health Behavior/Discharge Planning: Goal: Ability to manage health-related needs will improve Outcome: Progressing   Problem: Clinical Measurements: Goal: Will remain free from infection Outcome: Progressing   Problem: Clinical Measurements: Goal: Will remain free from infection Outcome: Progressing   Problem: Clinical Measurements: Goal: Will remain free from infection Outcome: Progressing

## 2021-04-05 NOTE — Progress Notes (Signed)
Progress Note   Patient: Chad Russell TDD:[id number] DOB: [birth year] DOA: 04/09/2021     11 DOS: the patient was seen and examined on 04/05/2021   Brief hospital course: Chad Russell is a 88  yo male with PMH PAF, BPH, CAD, CHB, HTN, Parkinson's with dementia, OSA, arthritis who presented to the ER with worsening mentation.  He was recently admitted in December 2022 for PNA (negative covid at that time) due to presumed aspiration.  He has been less mobile since October 2022 with significant progressive decline from a mobility standpoint.  He has been residing at [facility] since discharge on 02/13/21.  He has been bedbound for several weeks since that time as well.  On workup he was reported to have increased confusion compared to baseline and associated fever.  He was found to be covid positive on workup and was started on Remdesivir. He also has a stage 4 sacral pressure ulcer that has had difficulty healing.   Assessment and Plan: * Multifocal pneumonia- (present on admission) - Differential includes ongoing aspiration from underlying advanced Parkinson's disease/dementia versus contribution from COVID-pneumonia - given MRSA positive swab, antibiotics were changed to IV vancomycin, cefepime, Flagyl for 7 days. -PICC line placed on 2/7 by IR -Cultures negative so far.  Continue IV Unasyn however no significant improvement, x-ray showed worsening of the infiltrates.   Protein-calorie malnutrition, severe- (present on admission) - On tube feeds  Dysphagia - Underwent SLP eval on 03/26/2021 with overt signs of dysphagia and risk for aspiration. -Unclear long-term plan, needs ongoing palliative GOC, wife hopeful for meaningful recovery -Continue tube feeds, remains high respiration risk. Explained to the patient's wife and she understands that if patient has another aspiration event, tube feeds will be discontinued.   Lung nodule - Multiple lung nodules noted on CT chest (RUL,  RLL, LUL, LLL). Also has R>L pleural effusion.  Differential for effusion would include parapneumonic versus malignant -Regardless, given his extremely frail state, poor functional status, underlying dementia he would be a extremely poor treatment candidate -Dr Sabino Gasser updated wife on lung nodules on 2/4; per documentation she "already knew about them" and if "he never got cancer in the past he probably doesn't have it now" - per radiology fluid collection in right pleura not enough for thoracentesis at this time  Acute respiratory failure with hypoxia ([place])- (present on admission) - Worsening hypoxia due to aspiration, PNA, COVID, severe deconditioning -Hypoxia with worsening of infiltrates, continue O2 via NRB 15 L, no meaningful recovery despite antibiotics  Sacral decubitus ulcer - stage 4- (present on admission) - No cellulitis or osteomyelitis on CT - follow wound care consult recommendations   Constipation- (present on admission) - s/p enema in ER -continue Dulcolax, Senokot-S  Parkinson disease ([place])- (present on admission) - Advanced PD with dementia, significant cerebral atrophy  on CT head as well -Sinemet was increased to 4 times daily (was adjusted to QID from BID due to tremors) -Continue Sinemet via core track  Goals of care, counseling/discussion - Overall poor prognosis and functional status with high risk of aspiration and decompensation.  DNR/DNI. -GOC discussed with patient's wife on 2/10, see note - per wife's request, placed back on tube feeds, increase to 40 cc an hour and then to goal rate as tolerated.  She understands the high risk of aspiration.   -Continue IV Unasyn -No significant improvement. Overnight had removed core track which was replaced.  X-ray showed worsening of bilateral pulmonary infiltrates particularly in the left lower  lobe shows clearly aspirating (wife aware of the risk however requested to continue with the tube feeds and  antibiotics)   Atrial fibrillation ([place])- (present on admission) - Holding Xarelto while NPO. - continue lovenox for now  HTN (hypertension)- (present on admission) - BP soft, continue to hold antihypertensives   COVID-19 virus infection-resolved as of 03/30/2021, (present on admission) - continue remdesivir x 5 days (completed on 2/5) - CT value 33 on admission  - fax received from [facility] that his covid test was positive on 03/01/21 and he completed isolation as well; will d/c isolation at this time   Acute metabolic encephalopathy-resolved as of 03/27/2021, (present on admission) Delirium due to PNA / COVID on top of chronic parkinson's dementia.   Pressure injury Left medial sacrum, stage IV, POA  Nutrition Documentation    Flowsheet Row ED to Hosp-Admission (Current) from 04/12/2021 in [facility]  Nutrition Problem Severe Malnutrition  Etiology chronic illness  [Parkinson's disease]  Nutrition Goal Patient will meet greater than or equal to 90% of their needs  Interventions Tube feeding, Juven   Subjective: Overnight events noted, NG tube was pulled out, was replaced.  Unable to obtain review of system from the patient, awake.  No fevers.  Still remains on NRB, 15 L HFNC.  Physical Exam: Vitals:   04/05/21 0000 04/05/21 0106 04/05/21 0526 04/05/21 0800  BP:  (!) 127/58 128/78   Pulse:  (!) 53 79   Resp: 19  20 18   Temp:  98.3 F (36.8 C) 97.9 F (36.6 C)   TempSrc:  Axillary    SpO2: 93% 93% (!) 77%   Weight:      Height:       Physical Exam General: Alert and awake, keeping eyes closed Cardiovascular: S1 S2 clear, RRR. No pedal edema b/l Respiratory: Coarse breath sounds throughout Gastrointestinal: Soft, nontender, nondistended, NBS Ext: no pedal edema bilaterally Neuro: does not follow commands   Data Reviewed: Abdominal x-ray overnight showed core track in the mid body of stomach, worsening of  bilateral pulmonary infiltrates particularly in the left lower lobe  No new CBC, bmet  Family Communication: No family at the bedside  Disposition: Remains inpatient appropriate because: Worsening infiltrates bilaterally on tube feeds, hypoxic, on 15 L HFNC  Time spent: 40 minutes  Author: Estill Cotta, MD 04/05/2021 12:28 PM  For on call review [email or web address].

## 2021-04-06 ENCOUNTER — Inpatient Hospital Stay (HOSPITAL_COMMUNITY): Payer: Medicare HMO

## 2021-04-06 DIAGNOSIS — J189 Pneumonia, unspecified organism: Secondary | ICD-10-CM | POA: Diagnosis not present

## 2021-04-06 DIAGNOSIS — Z515 Encounter for palliative care: Secondary | ICD-10-CM | POA: Diagnosis not present

## 2021-04-06 DIAGNOSIS — I480 Paroxysmal atrial fibrillation: Secondary | ICD-10-CM

## 2021-04-06 DIAGNOSIS — U071 COVID-19: Secondary | ICD-10-CM | POA: Diagnosis not present

## 2021-04-06 DIAGNOSIS — G9341 Metabolic encephalopathy: Secondary | ICD-10-CM | POA: Diagnosis not present

## 2021-04-06 DIAGNOSIS — J9601 Acute respiratory failure with hypoxia: Secondary | ICD-10-CM | POA: Diagnosis not present

## 2021-04-06 DIAGNOSIS — Z7189 Other specified counseling: Secondary | ICD-10-CM | POA: Diagnosis not present

## 2021-04-06 LAB — GLUCOSE, CAPILLARY
Glucose-Capillary: 110 mg/dL — ABNORMAL HIGH (ref 70–99)
Glucose-Capillary: 114 mg/dL — ABNORMAL HIGH (ref 70–99)
Glucose-Capillary: 124 mg/dL — ABNORMAL HIGH (ref 70–99)
Glucose-Capillary: 141 mg/dL — ABNORMAL HIGH (ref 70–99)
Glucose-Capillary: 154 mg/dL — ABNORMAL HIGH (ref 70–99)
Glucose-Capillary: 96 mg/dL (ref 70–99)

## 2021-04-06 MED ORDER — FUROSEMIDE 10 MG/ML IJ SOLN
40.0000 mg | Freq: Once | INTRAMUSCULAR | Status: AC
  Administered 2021-04-06: 40 mg via INTRAVENOUS
  Filled 2021-04-06: qty 4

## 2021-04-06 NOTE — Progress Notes (Addendum)
RN had made several attempts through the night to perform mouth care and attempt to suction secretions.  Pt bites down on swabs and suction yaunker.  Pt with a wet, congested, nonproductive cough all night.  Secretions sounds as if they are sitting at back of his throat.   0521 stopped tube feed, flow error on kangaroo pump.  Rn immediately stopped tube feed, attempted to flush, then draw back with no results. Notified Jeannette Corpus NP on call floor coverage to get an xray to confirm tube placement given the previous issues aspiration and tube displacement   Xray results called in to RN  IMPRESSION: 1. Worsening bilateral pulmonary opacity with patchy appearance favoring pneumonia. Superimposed edema is not excluded in the setting of chronic marked cardiomegaly. 2. Shortening of feeding tube with tip at the lower esophagus.  Notified X. Blount, ordered to continue to keep tube feed stopped.  Possible lasix?    Previous Chest X ray 04/01/21     IMPRESSION: 1. Cardiac enlargement. Bilateral perihilar infiltrates may indicate edema or aspiration. 2. Small bilateral pleural effusions. 3. Appliances positioned as described. (enteric tube has been inserted with tip projected over the lower mediastinum consistent with location in the distal esophagus. Advancement is recommended if placement in the stomach is desired.)

## 2021-04-06 NOTE — Progress Notes (Signed)
°   04/06/21 1630  Clinical Encounter Type  Visited With Patient and family together  Visit Type Initial;Spiritual support;Social support  Referral From Chaplain;Palliative care team  Consult/Referral To [facility] f/u from palliative consult and other chaplain to provide ongoing support, particularly to Pt's spouse at EOL. Daryel November provided active listening of their seventy-two year relationship and of Pt's accomplishments at climbing three [place] in [place] in [place]. Spouse is bereft. Chaplain Burris encouraged making this time "theirs." Encouraged possibly playing music, whatever might be meaningful; informed spouse she could remain at bedside but also urged her to eat and to look out for her self-care. Ongoing pastoral care available as needed.

## 2021-04-06 NOTE — Progress Notes (Addendum)
[facility]                                                                               Jaxson Anglin, is a 88 y.o. male, DOB - 01-Feb-1927, FYT:[id number] Admit date - 04/13/2021    Outpatient Primary MD for the patient is Garwin Brothers, MD  LOS - 12  days    Brief summary   Mr. Rainey is a 88  yo male with PMH PAF, BPH, CAD, CHB, HTN, Parkinson's with dementia, OSA, arthritis who presented to the ER with worsening mentation.  He was recently admitted in December 2022 for PNA (negative covid at that time) due to presumed aspiration.  He has been less mobile since October 2022 with significant progressive decline from a mobility standpoint.  He has been residing at [facility] since discharge on 02/13/21.  He has been bedbound for several weeks since that time as well.  On workup he was reported to have increased confusion compared to baseline and associated fever.  He was found to be covid positive on workup and was started on Remdesivir. He also has a stage 4 sacral pressure ulcer that has had difficulty healing.    Assessment & Plan    Assessment and Plan: * Multifocal pneumonia- (present on admission) - Differential includes ongoing aspiration from underlying advanced Parkinson's disease/dementia versus contribution from COVID-pneumonia - given MRSA positive swab, antibiotics were changed to IV vancomycin, cefepime, Flagyl for 7 days. -PICC line placed on 2/7 by IR -Cultures negative so far.  On IV Unasyn, however no significant improvement, x-ray showed worsening of the infiltrates.   Protein-calorie malnutrition, severe- (present on admission) - On tube feeds, currently on hold  Dysphagia - Underwent SLP eval on 03/26/2021 with overt signs of dysphagia and risk for aspiration. -Unclear long-term plan, needs ongoing palliative GOC, wife hopeful for meaningful recovery -Tube feeds have been placed on hold again due to aspiration, congested.  No  significant improvement.  Lung nodule - Multiple lung nodules noted on CT chest (RUL, RLL, LUL, LLL). Also has R>L pleural effusion.  Differential for effusion would include parapneumonic versus malignant -Regardless, given his extremely frail state, poor functional status, underlying dementia he would be a extremely poor treatment candidate -Dr Sabino Gasser updated wife on lung nodules on 2/4; per documentation she "already knew about them" and if "he never got cancer in the past he probably doesn't have it now" - per radiology fluid collection in right pleura not enough for thoracentesis at this time  Acute respiratory failure with hypoxia ([place])- (present on admission) - Worsening hypoxia due to aspiration, PNA, COVID, severe deconditioning  Sacral decubitus ulcer - stage 4- (present on admission) - No cellulitis or osteomyelitis on CT - follow wound care consult recommendations   Constipation- (present on admission) - s/p enema in ER -continue Dulcolax, Senokot-S  Parkinson disease ([place])- (present on admission) - Advanced PD with dementia, significant cerebral atrophy  on CT head as well -Sinemet was increased to 4 times daily (was adjusted to QID from BID due to tremors) -Continue Sinemet via core track  Goals of care, counseling/discussion - Overall poor prognosis and functional status with high risk  of aspiration and decompensation.  DNR/DNI. -GOC discussed with patient's wife on 2/10, was placed on TF's at wife's request and continued on IV Unasyn. -No improvement, barely responsive, congested with secretions.  Tube feeds placed on hold.  -X-ray 2/13 showed worsening bilateral pneumonia with superimposed edema I met with the patient's wife.  Patient is near end-of-life however patient's wife appears to be in severe denial.  She feels that he is not getting better because he did not get 2 days of antibiotics when he lost access last week however patient has been on antibiotics since  admission.  We had discussed about comfort care last week and she had requested to continue him on antibiotics and tube feeds for the weekend and to readdress goals of care today if no meaningful recovery.  Patient has not made any meaningful recovery in the last 2 weeks, barely responsive, pneumonia has been getting worse due to aspiration.  Patient's wife correlates comfort care with "actively killing him".  She brought up PEG tube placement and I explained to her that with his age, advanced dementia, advanced Parkinson, aspiration and other comorbidities, PEG tube will not help the patient. -I have requested ethics committee and Dr. Hilma Favors to weigh in after discussing with Dr. Domingo Cocking.  Atrial fibrillation ([place])- (present on admission) - Holding Xarelto while NPO. -Continue Lovenox for now  HTN (hypertension)- (present on admission) - BP now elevated   COVID-19 virus infection-resolved as of 03/30/2021, (present on admission) - continue remdesivir x 5 days (completed on 2/5) - CT value 33 on admission  - fax received from [facility] that his covid test was positive on 03/01/21 and he completed isolation as well; will d/c isolation at this time   Acute metabolic encephalopathy-resolved as of 03/27/2021, (present on admission) Delirium due to PNA / COVID on top of chronic parkinson's dementia.   Pressure injury Left medial sacrum stage IV, POA   Code Status: DNR DVT Prophylaxis:     Level of Care: Level of care: Progressive Family Communication:  Disposition Plan:     Remains inpatient appropriate:    Procedures:   Consultants:   Palliative medicine  Antimicrobials:  IV Unasyn 04/03/2021--->  IV vancomycin, cefepime, 04/18/2021-04/02/21 IV Flagyl 03/27/2021--04/02/2021   Medications   carbidopa-levodopa  1 tablet Oral QID   Chlorhexidine Gluconate Cloth  6 each Topical Daily   enoxaparin (LOVENOX) injection  75 mg Subcutaneous Q12H   feeding supplement (PROSource TF)   45 mL Per Tube BID   free water  150 mL Per Tube Q6H   Gerhardt's butt cream   Topical Daily   methylPREDNISolone (SOLU-MEDROL) injection  40 mg Intravenous Q24H   nutrition supplement (JUVEN)  1 packet Oral BID BM   polyethylene glycol  17 g Oral BID   scopolamine  1 patch Transdermal Q72H   senna-docusate  2 tablet Oral BID   timolol  1 drop Both Eyes QHS     Subjective:   Tayron Hunnell was seen and examined today.  Minimally responsive, coarse breath sounds throughout. Overnight tube feeds were placed on hold    Objective:   Vitals:   04/05/21 2005 04/06/21 0518 04/06/21 1159 04/06/21 1311  BP: 126/61 (!) 150/88 (!) 160/65 125/64  Pulse: (!) 53 64 (!) 50 76  Resp: _0 Temp: 98.2 F (36.8 C) 97.9 F (36.6 C) 98.1 F (36.7 C) 98.4 F (36.9 C)  TempSrc: Oral Oral Oral Oral  SpO2: 100% 96% 92% 91%  Weight:      Height:        Intake/Output Summary (Last 24 hours) at 04/06/2021 1441 Last data filed at 04/06/2021 1300 Gross per 24 hour  Intake 7040 ml  Output 3600 ml  Net 3440 ml   Filed Weights   03/31/21 0500 04/01/21 0500 04/02/21 0610  Weight: 79.4 kg 80.7 kg 79.8 kg     Exam General: Minimally responsive, on nonrebreather Cardiovascular: S1 S2 auscultated, RRR Respiratory: Coarse breath sounds throughout Gastrointestinal: Soft, nontender, nondistended, + bowel sounds Ext: no pedal edema bilaterally Neuro: not responding to any verbal commands  Data Reviewed:   No new labs from today  CBG (last 3)  Recent Labs    04/06/21 0343 04/06/21 0737 04/06/21 1151  GLUCAP 141* 110* 96     Radiology Studies: I have personally reviewed the imaging studies  DG Chest 1 View  Result Date: 04/06/2021 CLINICAL DATA:  Increased shortness of breath this morning EXAM: CHEST  1 IMPRESSION: 1. Worsening bilateral pulmonary opacity with patchy appearance favoring pneumonia. Superimposed edema is not excluded in the setting of chronic marked cardiomegaly. 2.  Shortening of feeding tube with tip at the lower esophagus. Electronically Signed   By: Jorje Guild M.D.   On: 04/06/2021 05:56       Tamia Dial M.D. [facility] 04/06/2021, 2:41 PM  Available via Epic secure chat 7am-7pm After 7 pm, please refer to night coverage provider listed on amion.

## 2021-04-06 NOTE — Progress Notes (Signed)
Daily Progress Note   Patient Name: Chad Russell       Date: 04/06/2021 DOB: Jul 08, 1926  Age: 88 y.o. MRN#: [id number] Attending Physician: Mendel Corning, MD Primary Care Physician: Garwin Brothers, MD Admit Date: 04/18/2021  Reason for Consultation/Follow-up: Establishing goals of care  Subjective: Received update from bedside RN that Chad Russell NG was displaced.  I asked him to remove the NG as it was no longer correctly placed, and I met at the bedside to discuss goals of care with his wife.  She expressed that she felt he was not getting better as his care needs were not being met.  I shared with her my concern that, unfortunately, he is getting worse despite being back on antibiotics and he continues to aspirate as demonstrated by continued worsening both clinically and on most recent chest x-ray.  She expressed that she believes he has not improved because there was a period last week where he he was unable to receive IV antibiotics while awaiting PICC placement.  I expressed that I do not think that this is the reason he continues to decline as the worsening has occurred despite being back on antibiotics for several days.   She also requested another feeding tube placed.  I shared with her that I did not feel that was going to benefit him as this is a problem with aspiration which will occur regardless of tube feeding as he will continue to aspirate on secretions as well.  She expressed that she wants to "override" me regarding recommendation not to replace NG tube.    I gently told my fear is that Chad Russell is not going to get better regardless of interventions, and she told me, "Don't say that.  Why would you say that?" and asked me to leave and not come back to his room.  She did request  to discuss with Dr. Tana Coast.  Length of Stay: 12  Current Medications: Scheduled Meds:   carbidopa-levodopa  1 tablet Oral QID   Chlorhexidine Gluconate Cloth  6 each Topical Daily   enoxaparin (LOVENOX) injection  75 mg Subcutaneous Q12H   feeding supplement (PROSource TF)  45 mL Per Tube BID   free water  150 mL Per Tube Q6H   Gerhardt's butt cream   Topical Daily   methylPREDNISolone (  SOLU-MEDROL) injection  40 mg Intravenous Q24H   nutrition supplement (JUVEN)  1 packet Oral BID BM   polyethylene glycol  17 g Oral BID   scopolamine  1 patch Transdermal Q72H   senna-docusate  2 tablet Oral BID   timolol  1 drop Both Eyes QHS    Continuous Infusions:  ampicillin-sulbactam (UNASYN) IV 3 g (04/06/21 0835)   feeding supplement (OSMOLITE 1.5 CAL) 1,000 mL (04/05/21 0227)    PRN Meds: acetaminophen, atropine, bisacodyl, glycopyrrolate, HYDROcodone-acetaminophen, lidocaine (PF), morphine injection, ondansetron **OR** ondansetron (ZOFRAN) IV, sodium chloride flush  Physical Exam         Patient appears pale and weak Patient appears chronically ill Has NG tube Has coarse breath sounds Does not not have edema Has muscle wasting Does not wake or speak to me today  Vital Signs: BP 125/64 (BP Location: Right Arm)    Pulse 76    Temp 98.4 F (36.9 C) (Oral)    Resp 20    Ht 6' 1"  (1.854 m)    Wt 79.8 kg    SpO2 91%    BMI 23.22 kg/m  SpO2: SpO2: 91 % O2 Device: O2 Device: NRB O2 Flow Rate: O2 Flow Rate (L/min): 15 L/min  Intake/output summary:  Intake/Output Summary (Last 24 hours) at 04/06/2021 1409 Last data filed at 04/06/2021 1300 Gross per 24 hour  Intake 7040 ml  Output 3600 ml  Net 3440 ml    LBM: Last BM Date: 05/02/21 Baseline Weight: Weight: 74.8 kg Most recent weight: Weight: 79.8 kg       Palliative Assessment/Data:      Patient Active Problem List   Diagnosis Date Noted   Dysphagia 03/27/2021   Protein-calorie malnutrition, severe 03/27/2021   Lung  nodule 03/26/2021   Constipation 04/21/2021   Sacral decubitus ulcer - stage 4 04/03/2021   Acute respiratory failure with hypoxia (HCC) 04/19/2021   Pressure injury of skin 02/10/2021   Generalized weakness 02/07/2021   Dehydration 02/07/2021   Acute prerenal azotemia 02/07/2021   Parkinson disease ([place])    Protein calorie malnutrition (HCC)    Multifocal pneumonia 02/06/2021   UTI (urinary tract infection) 10/14/2020   Bradycardia 10/14/2020   Educated about COVID-19 virus infection 11/29/2019   Exudative age-related macular degeneration of left eye with active choroidal neovascularization ([place]) 10/02/2019   Central retinal vein occlusion with macular edema of right eye 06/20/2019   Central retinal vein occlusion with neovascularization of right eye 06/20/2019   Ocular ischemic syndrome 06/20/2019   Retinal hemorrhage of left eye 06/20/2019   Retinal hemorrhage of right eye 06/20/2019   AKI (acute kidney injury) ([place]) 12/15/2018   Hematoma of scalp    Heart block    Chest pain 05/04/2018   Fracture of femoral neck, left (HCC) 04/19/2017   MGUS (monoclonal gammopathy of unknown significance) 01/15/2017   BPH with obstruction/lower urinary tract symptoms 09/07/2016   Goals of care, counseling/discussion 08/24/2016   Urinary retention 05/13/2016   Balance problem 10/31/2013   OSA (obstructive sleep apnea) 10/31/2013   BPH (benign prostatic hyperplasia) 08/28/2012   Atrial fibrillation ([place]) 06/08/2012   Hyperplasia of prostate with lower urinary tract symptoms (LUTS) 04/28/2012   Moderate malnutrition ([place]) 04/28/2012   Infection of urinary tract - recurrent 04/26/2012   NSTEMI (non-ST elevated myocardial infarction) ([place]) 03/15/2011   Complete heart block ([place]) 03/15/2011   HTN (hypertension) 03/15/2011    Palliative Care Assessment & Plan   Patient Profile:    Assessment: Multifocal  pneumonia present on admission COVID-19 virus infection present on admission Acute  hypoxic respiratory failure Sacral decubitus ulcer present on admission History of lung nodule History of Parkinson's disease. Patient with recent admission in December 2022 for pneumonia, progressive functional decline since October 22 from mobility standpoint, was recently at [facility] and was noted to have been bedbound, found to have stage IV pressure sacral pressure ulcer with a difficulty in healing.  Recommendations/Plan: DNR/DNI I shared with his wife my concern that he continues to decline despite medical interventions.  I also shared my concern that he is approaching end-of-life regardless of interventions.  She was upset with this assessment and asked me to leave and not come back.  She did asked to speak with Dr. Tana Coast and I passed along this request.  Prognosis:  Poor.  I believe he is going to continue to decline regardless of interventions moving forward.  Discharge Planning: To Be Determined- I think there is a high likelihood that this will be a terminal admission  Care plan was discussed with  wife at bedside.   Micheline Rough, MD  Please contact Palliative Medicine Team phone at [phone] for questions and concerns.

## 2021-04-06 NOTE — TOC Progression Note (Signed)
Transition of Care [facility]) - Progression Note    Patient Details  Name: Chad Russell MRN: [id number] Date of Birth: December 16, 1926  [facility] CM/SW Contact  Leeroy Cha, RN Phone Number: 04/06/2021, 10:54 AM  Clinical Narrative:    Following for dc and toc needs.    Expected Discharge Plan: Home/Self Care Barriers to Discharge: Continued Medical Work up  Expected Discharge Plan and Services Expected Discharge Plan: Home/Self Care   Discharge Planning Services: CM Consult   Living arrangements for the past 2 months: Single Family Home                                       Social Determinants of Health (SDOH) Interventions    Readmission Risk Interventions No flowsheet data found.

## 2021-04-07 DIAGNOSIS — J189 Pneumonia, unspecified organism: Secondary | ICD-10-CM | POA: Diagnosis not present

## 2021-04-07 DIAGNOSIS — J9601 Acute respiratory failure with hypoxia: Secondary | ICD-10-CM | POA: Diagnosis not present

## 2021-04-07 DIAGNOSIS — G9341 Metabolic encephalopathy: Secondary | ICD-10-CM | POA: Diagnosis not present

## 2021-04-07 DIAGNOSIS — U071 COVID-19: Secondary | ICD-10-CM | POA: Diagnosis not present

## 2021-04-07 LAB — GLUCOSE, CAPILLARY
Glucose-Capillary: 112 mg/dL — ABNORMAL HIGH (ref 70–99)
Glucose-Capillary: 119 mg/dL — ABNORMAL HIGH (ref 70–99)
Glucose-Capillary: 97 mg/dL (ref 70–99)

## 2021-04-07 MED ORDER — MORPHINE SULFATE (PF) 2 MG/ML IV SOLN
2.0000 mg | INTRAVENOUS | Status: DC | PRN
  Administered 2021-04-08 – 2021-04-26 (×17): 2 mg via INTRAVENOUS
  Filled 2021-04-07 (×19): qty 1

## 2021-04-07 NOTE — Progress Notes (Signed)
2159- Patient allowed RN to perform mouth care, following simple directions to open mouth, to allow for mouth swabs, suction and moisturizer.  Pt minimally responsive,has followed RN simple directions.  Pt does not open eyes or engage with RN other wise.  Pt still with weak congested cough,  Pt is unable to clear secretions despite his best efforts and RNs efforts to suction.  Pt definitely appears more comfortable and is coughing less with out the tube feed going

## 2021-04-07 NOTE — Care Management Important Message (Signed)
Important Message  Patient Details IM Letter placed in Patients room. Name: Chad Russell MRN: [id number] Date of Birth: Dec 12, 1926   Medicare Important Message Given:  Yes     Kerin Salen 04/07/2021, 12:46 PM

## 2021-04-07 NOTE — Progress Notes (Signed)
Nutrition Follow-up  DOCUMENTATION CODES:   Severe malnutrition in context of chronic illness  INTERVENTION:  - will continue to monitor for GOC/plans.   NUTRITION DIAGNOSIS:   Severe Malnutrition related to chronic illness (Parkinson's disease) as evidenced by severe fat depletion, severe muscle depletion. -ongoing  GOAL:   Patient will meet greater than or equal to 90% of their needs -unmet  MONITOR:   Labs, I & O's   ASSESSMENT:   88  yo male with medical history listed in the chart of PAF, BPH, CAD, CHB, HTN, Parkinson's with dementia, OSA, and arthritis. He presented to the ED with worsening mentation. He was admitted in 01/2021 with PNA which at that time was presumed to be d/t aspiration. Notes indicate that patient has been less mobile since 11/2020. Patient has been at [facility] since 02/13/2021. He has been bedbound for several weeks. In the ED he was found to be COVID-19 positive. Admission dx of multifocal PNA.  Chart reviewed. On 2/13 at 0521 RN noted a pump error on kangaroo pump so tube feeding was placed on hold. Abdominal x-ray obtained which showed worsening bilateral pulmonary opacity with patchy appearance and tip of feeding tube noted to be in the lower esophagus. Tube was subsequently removed.   Palliative Care following and last saw patient yesterday afternoon. Note read. MD note from yesterday afternoon read.  Patient has been NPO for entirety of hospitalization. Notes indicate he is disoriented x4 and minimally responsive.  RN note from night shift last night into this morning indicates patient with a weak cough and inability to clear secretions.   He has not been weighed since 2/9. Moderate pitting edema to BUE documented in the edema section of flow sheet.    Labs reviewed; CBG: 147 mg/dl, BUN: 48 mg/dl.  Medications reviewed; 40 mg solu-medrol/day.   Diet Order:   Diet Order             Diet NPO time specified Except for: Ice Chips,  Sips with Meds  Diet effective now                   EDUCATION NEEDS:   Education needs have been addressed  Skin:  Skin Assessment: Skin Integrity Issues: Skin Integrity Issues:: Stage IV Stage IV: sacrum (previously 4.5 cm x 4.5 cm x1 cm; now 4.5 cm x 3 cm x 0.5 cm)  Last BM:  2/5 (type 3, small amount)  Height:   Ht Readings from Last 1 Encounters:  03/28/21 6\' 1"  (1.854 m)    Weight:   Wt Readings from Last 1 Encounters:  04/02/21 79.8 kg     BMI:  Body mass index is 23.22 kg/m.   Estimated Nutritional Needs:  Kcal:  2200-2400 kcal Protein:  110-120 grams Fluid:  >/= 2.5 L/day     Jarome Matin, MS, RD, LDN Inpatient Clinical Dietitian RD pager # available in [place]  After hours/weekend pager # available in [facility]

## 2021-04-07 NOTE — Progress Notes (Signed)
Palliative Care Progress Note  Mr. Fluegel is lying in bed, mostly unresponsive but will open his eyes when prompted and cooperated with cleaning out his mouth and small sis of liquids. He appeared uncomfortable with the full face mask on and was pulling at it. He is extremely cachectic and malnourished, his extremities are severely mottled. He is moving very little air and his chest is skeletal.  I met his wife at bedside and introduced myself as one of the palliative care providers. She immediately requested to speak outside the room. His wife is distraught over his condition and intensely grieving. She continues to replay events over the past few weeks where things have gone very wrong for her husband-she still wants to see him as a strong and thriving man-she described him as intensely "athletic". They have been married for 70+ years. When I asked Mrs. Doverspike who her support was other than her husband he said "no one". I acknowledged that losing her husband would be like losing a part of herself. She is able to tell me today that she has accepted the truth that he is dying and cannot recover. She asks me how long he has.  One thing that was very important to her today was to try to honor his wish to donate his body to science. He wants to be an anatomical gift to [employer] students. She is most worried about his body being acceptable. She is adamant that she wants the antibiotics continued because she does not want him to die of sepsis-sepsis would make it so that his body could not be donated.She has listed several other things that would make his body unacceptable for donation and I am indeed concerned that he would not qualify. I told her ultimately we would be truthful about his condition and this is something that none of [place] -including the patient can control. She tells me she understands.I went over the totality of his condition and asked her to consider what dignity would  look like for him at the end of his life. I also asked her to consider what prolonging his life in his current condition meant for him and for her- she wants to hang on to him as long as possible and just doesn't see him through the same lens as his providers.  Recommendations:  DNR Ongoing spiritual care support -Mrs. Jaber is very high risk for complex grief. Discontinue CBG readings. She agreed to transition him to a nasal cannula-I provided education on looking at him and not his numbers-she wants monitoring to continue. Continue antibiotics at her request, although once he has completed a standard course we are under no medical imperative to continue and should maintain stewardship standards. Discontinue Steroids. Could ask TOC/CSW to assist with the anatomical gift issues that his wife expresses. She has agreed to providing comfort medication such as morphine if he shows signs of distress. I explained he would receive nothing more or less than he needed for comfort. Provide frequent oral care. Palliative wound care. Will continue to work with Mrs. Horseman on the transition to full comfort-I anticipate he has hours to days.    Lane Hacker, DO Palliative Medicine  Time: 65 minutes

## 2021-04-07 NOTE — Progress Notes (Signed)
[facility]                                                                               Chad Russell, is a 88 y.o. male, DOB - 03/09/1926, HGD:[id number] Admit date - 04/08/2021    Outpatient Primary MD for the patient is Garwin Brothers, MD  LOS - 13  days    Brief summary   Chad Russell is a 88  yo male with PMH PAF, BPH, CAD, CHB, HTN, Parkinson's with dementia, OSA, arthritis who presented to the ER with worsening mentation.  He was recently admitted in December 2022 for PNA (negative covid at that time) due to presumed aspiration.  He has been less mobile since October 2022 with significant progressive decline from a mobility standpoint.  He has been residing at [facility] since discharge on 02/13/21.  He has been bedbound for several weeks since that time as well.  On workup he was reported to have increased confusion compared to baseline and associated fever.  He was found to be covid positive on workup and was started on Remdesivir. He also has a stage 4 sacral pressure ulcer that has had difficulty healing.    Assessment & Plan    Assessment and Plan: * Multifocal pneumonia- (present on admission) - Differential includes ongoing aspiration from underlying advanced Parkinson's disease/dementia versus contribution from COVID-pneumonia - given MRSA positive swab, antibiotics were changed to IV vancomycin, cefepime, Flagyl for 7 days. -PICC line placed on 2/7 by IR -Cultures negative so far.  On IV Unasyn, however no significant improvement, x-ray showed worsening of the infiltrates.  -Patient's wife wants to continue IV Unasyn.  Protein-calorie malnutrition, severe- (present on admission) - On tube feeds, currently on hold  Dysphagia - Underwent SLP eval on 03/26/2021 with overt signs of dysphagia and risk for aspiration. -wife hopeful for meaningful recovery -Tube feeds have been placed on hold again due to aspiration, congested.  No significant  improvement. -Core track is out and will not be replaced, patient has been clearly aspirating, wife understands the risk.  Lung nodule - Multiple lung nodules noted on CT chest (RUL, RLL, LUL, LLL). Also has R>L pleural effusion.  Differential for effusion would include parapneumonic versus malignant -Regardless, given his extremely frail state, poor functional status, underlying dementia he would be a extremely poor treatment candidate -Dr Sabino Gasser updated wife on lung nodules on 2/4; per documentation she "already knew about them" and if "he never got cancer in the past he probably doesn't have it now" - per radiology fluid collection in right pleura not enough for thoracentesis at this time  Acute respiratory failure with hypoxia ([place])- (present on admission) - Worsening hypoxia due to aspiration, PNA, COVID, severe deconditioning  Sacral decubitus ulcer - stage 4- (present on admission) - No cellulitis or osteomyelitis on CT - follow wound care consult recommendations   Constipation- (present on admission) - s/p enema in ER -continue Dulcolax, Senokot-S  Parkinson disease ([place])- (present on admission) - Advanced PD with dementia, significant cerebral atrophy  on CT head as well -Sinemet was increased to 4 times daily (was adjusted to QID from BID due to tremors) -Core track  currently out and patient has been aspirating,  Sinemet on hold  Goals of care, counseling/discussion - Overall poor prognosis and functional status with high risk of aspiration and decompensation.  DNR/DNI. -GOC discussed with patient's wife on 2/10, was placed on TF's at wife's request and continued on IV Unasyn. -X-ray 2/13 showed worsening bilateral pneumonia with superimposed edema -Goals of care discussion on 2/13, please see my note.  Appreciate Dr. Hilma Favors for meeting with patient's wife.  Ethics committee, Dr. Cathlean Sauer also aware.  Atrial fibrillation ([place])- (present on admission) - Holding Xarelto while  NPO. -Continue Lovenox for now  HTN (hypertension)- (present on admission) - BP stable   COVID-19 virus infection-resolved as of 03/30/2021, (present on admission) - continue remdesivir x 5 days (completed on 2/5) - CT value 33 on admission  - fax received from [facility] that his covid test was positive on 03/01/21 and he completed isolation as well; will d/c isolation at this time   Acute metabolic encephalopathy-resolved as of 03/27/2021, (present on admission) Delirium due to PNA / COVID on top of chronic parkinson's dementia.   Code Status: DNR DVT Prophylaxis:    Lovenox   Level of Care: Level of care: Progressive Family Communication: Updated patient's wife and goals of care meeting on 2/13  Disposition Plan:     Remains inpatient appropriate:     Consultants:   Palliative medicine  Antimicrobials:  IV Unasyn 04/03/2021--->   IV vancomycin, cefepime, 04/20/2021-04/02/21 IV Flagyl 03/27/2021--04/02/2021   Medications   carbidopa-levodopa  1 tablet Oral QID   Chlorhexidine Gluconate Cloth  6 each Topical Daily   Gerhardt's butt cream   Topical Daily   methylPREDNISolone (SOLU-MEDROL) injection  40 mg Intravenous Q24H   scopolamine  1 patch Transdermal Q72H   timolol  1 drop Both Eyes QHS     Subjective:   Chad Russell was seen and examined today.  Somnolent however opens eyes to verbal commands.  Coarse breath sounds throughout.  Core track out.  No fevers or chills.  Coughing, better and congested  Objective:   Vitals:   04/06/21 1311 04/06/21 2038 04/07/21 0500 04/07/21 1329  BP: 125/64 (!) 153/61 (!) 162/69 (!) 145/57  Pulse: 76 (!) 55 70 (!) 27  Resp: 20 20  20   Temp: 98.4 F (36.9 C) 98 F (36.7 C) 98.2 F (36.8 C) 97.7 F (36.5 C)  TempSrc: Oral Axillary Axillary Oral  SpO2: 91% 98% 98% 100%  Weight:      Height:        Intake/Output Summary (Last 24 hours) at 04/07/2021 1546 Last data filed at 04/07/2021 0030 Gross per 24 hour  Intake  300 ml  Output 400 ml  Net -100 ml   Filed Weights   03/31/21 0500 04/01/21 0500 04/02/21 0610  Weight: 79.4 kg 80.7 kg 79.8 kg     Exam General: Somnolent, arousable, coarse breath sounds Cardiovascular: S1 S2 auscultated, RRR Respiratory: Coarse breath sounds bilaterally Gastrointestinal: Soft, nontender, nondistended, + bowel sounds Ext: no pedal edema bilaterally Neuro: does not follow commands   Data Reviewed:   No new labs from today.  Chest x-ray on 04/06/2021 had shown worsening of pneumonia. CBG (last 3)  Recent Labs    04/07/21 0001 04/07/21 0420 04/07/21 0749  GLUCAP 112* 119* 97         Annaly Skop M.D. [facility] 04/07/2021, 3:46 PM  Available via Epic secure chat 7am-7pm After 7 pm, please refer to night coverage provider listed  on amion.

## 2021-04-08 DIAGNOSIS — Z7189 Other specified counseling: Secondary | ICD-10-CM | POA: Diagnosis not present

## 2021-04-08 DIAGNOSIS — J189 Pneumonia, unspecified organism: Secondary | ICD-10-CM | POA: Diagnosis not present

## 2021-04-08 DIAGNOSIS — G9341 Metabolic encephalopathy: Secondary | ICD-10-CM | POA: Diagnosis not present

## 2021-04-08 DIAGNOSIS — J9601 Acute respiratory failure with hypoxia: Secondary | ICD-10-CM | POA: Diagnosis not present

## 2021-04-08 NOTE — Consult Note (Signed)
Ethics committee consultation.    I have reviewed the medical chart and communicated with Dr. Tana Coast and Dr. Domingo Cocking.  Dr. Hilma Favors from palliative care has evaluated Chad Russell and had a very thorough conversation with Chad Russell as noted in the note from 04/07/21.   Chad Russell is the main care giver and decision maker in regards of Chad Russell medical care.  At this point per my review there is an agreement between Chad Russell and the medical team.   Currently there seems to be no specific ethical question to be addressed.   The committee is aware of the current situation and will be available in case a specific ethical questions arises.   Chad Bulthuis MD.   04/08/2021 8:41 AM  For on call review [email or web address].

## 2021-04-08 NOTE — TOC Progression Note (Signed)
Transition of Care [facility]) - Progression Note    Patient Details  Name: Chad Russell MRN: [id number] Date of Birth: 11-22-1926  Transition of Care [facility]) CM/SW Contact  Leeroy Cha, RN Phone Number: 04/08/2021, 8:40 AM  Clinical Narrative:    Following for toc needs   Expected Discharge Plan: Home/Self Care Barriers to Discharge: Continued Medical Work up  Expected Discharge Plan and Services Expected Discharge Plan: Home/Self Care   Discharge Planning Services: CM Consult   Living arrangements for the past 2 months: Single Family Home                                       Social Determinants of Health (SDOH) Interventions    Readmission Risk Interventions No flowsheet data found.

## 2021-04-08 NOTE — Progress Notes (Signed)
Progress Note   Patient: Chad Russell ZLD:[id number] DOB: 09-11-1926 DOA: 03/31/2021     14 DOS: the patient was seen and examined on 04/08/2021   Brief hospital course: Mr. Kluever is a 88  yo male with PMH PAF, BPH, CAD, CHB, HTN, Parkinson's with dementia, OSA, arthritis who presented to the ER with worsening mentation.  He was recently admitted in December 2022 for PNA (negative covid at that time) due to presumed aspiration.  He has been less mobile since October 2022 with significant progressive decline from a mobility standpoint.  He has been residing at [facility] since discharge on 02/13/21.  He has been bedbound for several weeks since that time as well.  On workup he was reported to have increased confusion compared to baseline and associated fever.  He was found to be covid positive on workup and was started on Remdesivir. He also has a stage 4 sacral pressure ulcer that has had difficulty healing.   Assessment and Plan: * Multifocal pneumonia- (present on admission) - Differential includes ongoing aspiration from underlying advanced Parkinson's disease/dementia versus contribution from COVID-pneumonia - given MRSA positive swab, antibiotics were changed to IV vancomycin, cefepime, Flagyl for 7 days. -PICC line placed on 2/7 by IR -Cultures negative so far.  On IV Unasyn, however no significant improvement, x-ray showed worsening of the infiltrates.  - now on repeat course of Unasyn (started 2/10); complete 7 days and stop  COVID-19 virus infection-resolved as of 03/30/2021, (present on admission) - continue remdesivir x 5 days (completed on 2/5) - CT value 33 on admission  - fax received from [facility] that his covid test was positive on 03/01/21 and he completed isolation as well; will d/c isolation at this time   Acute respiratory failure with hypoxia ([place])- (present on admission) - Worsening hypoxia due to aspiration, PNA, COVID, severe  deconditioning  Sacral decubitus ulcer - stage 4- (present on admission) - No cellulitis or osteomyelitis on CT - follow wound care consult recommendations   Goals of care, counseling/discussion - Overall poor prognosis and functional status with high risk of aspiration and decompensation.  DNR/DNI. -GOC discussed with patient's wife on 2/10, was placed on TF's at wife's request and continued on IV Unasyn. -X-ray 2/13 showed worsening bilateral pneumonia with superimposed edema - appreciate palliative care continuing to follow; evaluated by ethics as well - prognosis remains poor  Lung nodule - Multiple lung nodules noted on CT chest (RUL, RLL, LUL, LLL). Also has R>L pleural effusion.  Differential for effusion would include parapneumonic versus malignant -Regardless, given his extremely frail state, poor functional status, underlying dementia he would be a extremely poor treatment candidate -Dr. Sabino Gasser updated wife on lung nodules on 2/4; per documentation she "already knew about them" and if "he never got cancer in the past he probably doesn't have it now" - per radiology fluid collection in right pleura not enough for thoracentesis at this time  Constipation- (present on admission) - s/p enema in ER -continue Dulcolax, Senokot-S  Parkinson disease ([place])- (present on admission) - Advanced PD with dementia, significant cerebral atrophy  on CT head as well -Sinemet was increased to 4 times daily (was adjusted to QID from BID due to tremors) -Core trak currently out (since 2/13) and patient has been aspirating,  Sinemet on hold - speaks to poor prognosis  Dysphagia - Underwent SLP eval on 03/26/2021 with overt signs of dysphagia and risk for aspiration. -Tube feeds have been placed on hold  again due to aspiration. No significant improvement. -Core trak out since 2/13 and will not be replaced, patient has been clearly aspirating, wife understands the risk.  Acute metabolic  encephalopathy-resolved as of 03/27/2021, (present on admission) Delirium due to PNA / COVID on top of chronic parkinson's dementia.  Protein-calorie malnutrition, severe- (present on admission) - aspiration with TF; core trak has been removed   Atrial fibrillation ([place])- (present on admission) - Holding Xarelto while NPO. -Continue Lovenox for now  HTN (hypertension)- (present on admission) - BP stable      Subjective: No events overnight.  Patient well-known to me from last week.  His wife is present bedside this morning.  She did not have much to say other than stating that he has been "talking a lot" and she still seems to have higher expectations than are currently being observed with his ongoing progressive decline and imminently approaching end-of-life.  Physical Exam: Vitals:   04/08/21 0418 04/08/21 0541 04/08/21 0930 04/08/21 1409  BP:  (!) 169/69  (!) 176/80  Pulse:  (!) 58  62  Resp:    (!) 22  Temp:  98.3 F (36.8 C)  98.1 F (36.7 C)  TempSrc:  Axillary  Oral  SpO2:  97% 93% 93%  Weight: 78 kg     Height:      Physical Exam Constitutional:      Comments: Chronically ill-appearing elderly gentleman lying in bed in no distress  HENT:     Head: Normocephalic and atraumatic.     Mouth/Throat:     Mouth: Mucous membranes are dry.  Eyes:     Extraocular Movements: Extraocular movements intact.  Cardiovascular:     Rate and Rhythm: Normal rate and regular rhythm.  Pulmonary:     Breath sounds: Rhonchi present. No wheezing.  Abdominal:     General: Bowel sounds are normal. There is no distension.     Palpations: Abdomen is soft.     Tenderness: There is no abdominal tenderness.  Musculoskeletal:        General: No swelling.     Cervical back: Normal range of motion and neck supple.     Comments: PICC in LUE  Skin:    General: Skin is warm.  Neurological:     Comments: Chronic underlying dementia appreciated.  Follows commands and moves all 4 extremities  albeit extremely weak     Data Reviewed:  No results found for this or any previous visit (from the past 24 hour(s)).  I have Reviewed nursing notes, Vitals, and Lab results since pt's last encounter. I have ordered test including BMP, CBC, Mg I have reviewed the last note from staff over past 24 hours I have discussed pt's care plan and test results with nursing staff, case manager   Family Communication: wife  Disposition: Status is: Inpatient Remains inpatient appropriate because: Treatment as outlined in A&P   Planned Discharge Destination:  to be determined; likely in hospital death     Time spent: 35 minutes   Author: Dwyane Dee, MD 04/08/2021 5:48 PM  For on call review [email or web address].

## 2021-04-09 DIAGNOSIS — Z7189 Other specified counseling: Secondary | ICD-10-CM | POA: Diagnosis not present

## 2021-04-09 DIAGNOSIS — J189 Pneumonia, unspecified organism: Secondary | ICD-10-CM | POA: Diagnosis not present

## 2021-04-09 LAB — CULTURE, BLOOD (ROUTINE X 2)
Culture: NO GROWTH
Culture: NO GROWTH
Special Requests: ADEQUATE
Special Requests: ADEQUATE

## 2021-04-09 NOTE — Progress Notes (Signed)
Progress Note   Patient: Chad Russell RUE:[id number] DOB: 12/04/1926 DOA: 04/12/2021     15 DOS: the patient was seen and examined on 04/09/2021   Brief hospital course: Mr. Pope is a 88  yo male with PMH PAF, BPH, CAD, CHB, HTN, Parkinson's with dementia, OSA, arthritis who presented to the ER with worsening mentation.  He was recently admitted in December 2022 for PNA (negative covid at that time) due to presumed aspiration.  He has been less mobile since October 2022 with significant progressive decline from a mobility standpoint.  He has been residing at [facility] since discharge on 02/13/21.  He has been bedbound for several weeks since that time as well.  On workup he was reported to have increased confusion compared to baseline and associated fever.  He was found to be covid positive on workup and was started on Remdesivir. He also has a stage 4 sacral pressure ulcer that has had difficulty healing.   Assessment and Plan: * Multifocal pneumonia- (present on admission) - Differential includes ongoing aspiration from underlying advanced Parkinson's disease/dementia versus contribution from COVID-pneumonia - given MRSA positive swab, antibiotics were changed to IV vancomycin, cefepime, Flagyl for 7 days. -PICC line placed on 2/7 by IR -Cultures negative so far.  On IV Unasyn, however no significant improvement, x-ray showed worsening of the infiltrates.  - now on repeat course of Unasyn (started 2/10); complete 7 days and stop  COVID-19 virus infection-resolved as of 03/30/2021, (present on admission) - continue remdesivir x 5 days (completed on 2/5) - CT value 33 on admission  - fax received from [facility] that his covid test was positive on 03/01/21 and he completed isolation as well; will d/c isolation at this time   Acute respiratory failure with hypoxia ([place])- (present on admission) - Worsening hypoxia due to aspiration, PNA, COVID, severe  deconditioning  Sacral decubitus ulcer - stage 4- (present on admission) - No cellulitis or osteomyelitis on CT - follow wound care consult recommendations   Goals of care, counseling/discussion - Overall poor prognosis and functional status with high risk of aspiration and decompensation.  DNR/DNI. -GOC discussed with patient's wife on 2/10, was placed on TF's at wife's request and continued on IV Unasyn. -X-ray 2/13 showed worsening bilateral pneumonia with superimposed edema - appreciate palliative care continuing to follow; evaluated by ethics as well - prognosis remains poor - personally spent ~30 minutes talking with his wife in the hallway this morning again about his clinical status. She again was very distraught and displaying classic emotions of grief to include anger, bargaining, and denial (examples include begging me to keep abx going indefinitely, restarting IVF and nutrition; then becoming angry at my responses; then talking about his athletic abilities years ago when he was extremely active before all his decline ensued). I tried to provide empathy while listening and as much reassurance as possible that we have his best interest in hand and want him to have quality of life as well; furthermore I emphasized that some of her requests can be considered "doing harm" such as feeding him again and starting IVF, etc (which would inevitably cause further aspiration and respiratory distress)  Lung nodule - Multiple lung nodules noted on CT chest (RUL, RLL, LUL, LLL). Also has R>L pleural effusion.  Differential for effusion would include parapneumonic versus malignant -Regardless, given his extremely frail state, poor functional status, underlying dementia he would be a extremely poor treatment candidate -Dr. Sabino Gasser updated wife on  lung nodules on 2/4; per documentation she "already knew about them" and if "he never got cancer in the past he probably doesn't have it now" - per radiology  fluid collection in right pleura not enough for thoracentesis at this time  Constipation- (present on admission) - s/p enema in ER -continue Dulcolax, Senokot-S  Parkinson disease ([place])- (present on admission) - Advanced PD with dementia, significant cerebral atrophy  on CT head as well -Sinemet was increased to 4 times daily (was adjusted to QID from BID due to tremors) -Core trak currently out (since 2/13) and patient has been aspirating,  Sinemet on hold - speaks to poor prognosis  Dysphagia - Underwent SLP eval on 03/26/2021 with overt signs of dysphagia and risk for aspiration. -Tube feeds have been placed on hold again due to aspiration. No significant improvement. -Core trak out since 2/13 and will not be replaced, patient has been clearly aspirating, wife understands the risk.  Acute metabolic encephalopathy-resolved as of 03/27/2021, (present on admission) Delirium due to PNA / COVID on top of chronic parkinson's dementia.  Protein-calorie malnutrition, severe- (present on admission) - aspiration with TF; core trak has been removed   Atrial fibrillation ([place])- (present on admission) - Holding Xarelto while NPO. -Continue Lovenox for now  HTN (hypertension)- (present on admission) - BP stable      Subjective: No events overnight. Wife present bedside; see my goals of care discussion.  I spent almost 30 minutes with her in the hallway again talking about his progressive decline.  She was tearfully upset but also angry.  Continues to exhibit bargaining, anger, denial. Provided empathetic listening and as much comfort as I could give her as well as reassurance but she was still not terribly convinced.  Physical Exam: Vitals:   04/08/21 1409 04/08/21 2114 04/09/21 0553 04/09/21 1222  BP: (!) 176/80 126/85 131/84 (!) 145/63  Pulse: 62 (!) 39 67 74  Resp: (!) 22  20 20   Temp: 98.1 F (36.7 C) 98.1 F (36.7 C) 98.1 F (36.7 C) 98 F (36.7 C)  TempSrc: Oral Axillary  Axillary Oral  SpO2: 93% 98% 93% 94%  Weight:      Height:      Physical Exam Constitutional:      Comments: Chronically ill-appearing elderly gentleman lying in bed in no distress  HENT:     Head: Normocephalic and atraumatic.     Mouth/Throat:     Mouth: Mucous membranes are dry.  Eyes:     Extraocular Movements: Extraocular movements intact.  Cardiovascular:     Rate and Rhythm: Normal rate and regular rhythm.  Pulmonary:     Breath sounds: Rhonchi present. No wheezing.  Abdominal:     General: Bowel sounds are normal. There is no distension.     Palpations: Abdomen is soft.     Tenderness: There is no abdominal tenderness.  Musculoskeletal:        General: No swelling.     Cervical back: Normal range of motion and neck supple.     Comments: PICC in LUE  Skin:    General: Skin is warm.  Neurological:     Comments: Chronic underlying dementia appreciated.  Follows commands and moves all 4 extremities albeit extremely weak     Data Reviewed:  No results found for this or any previous visit (from the past 24 hour(s)).  I have Reviewed nursing notes, Vitals, and Lab results since pt's last encounter. I have ordered test including BMP, CBC, Mg I  have reviewed the last note from staff over past 24 hours I have discussed pt's care plan and test results with nursing staff, case manager   Family Communication: wife  Disposition: Status is: Inpatient Remains inpatient appropriate because: Treatment as outlined in A&P   Planned Discharge Destination:  to be determined; likely in hospital death      Time spent: Greater than 50% of the 60 minute visit was spent in counseling/coordination of care for the patient as laid out in the A&P.   Author: Dwyane Dee, MD 04/09/2021 2:47 PM  For on call review [email or web address].

## 2021-04-10 DIAGNOSIS — J9601 Acute respiratory failure with hypoxia: Secondary | ICD-10-CM | POA: Diagnosis not present

## 2021-04-10 DIAGNOSIS — Z7189 Other specified counseling: Secondary | ICD-10-CM | POA: Diagnosis not present

## 2021-04-10 DIAGNOSIS — J189 Pneumonia, unspecified organism: Secondary | ICD-10-CM | POA: Diagnosis not present

## 2021-04-10 DIAGNOSIS — G9341 Metabolic encephalopathy: Secondary | ICD-10-CM | POA: Diagnosis not present

## 2021-04-10 NOTE — Progress Notes (Signed)
Progress Note   Patient: Chad Russell VVO:[id number] DOB: 1926/09/14 DOA: 04/08/2021     16 DOS: the patient was seen and examined on 04/10/2021   Brief hospital course: Mr. Trigueros is a 88  yo male with PMH PAF, BPH, CAD, CHB, HTN, Parkinson's with dementia, OSA, arthritis who presented to the ER with worsening mentation.  He was recently admitted in December 2022 for PNA (negative covid at that time) due to presumed aspiration.  He has been less mobile since October 2022 with significant progressive decline from a mobility standpoint.  He has been residing at [facility] since discharge on 02/13/21.  He has been bedbound for several weeks since that time as well.  On workup he was reported to have increased confusion compared to baseline and associated fever.  He was found to be covid positive on workup and was started on Remdesivir. He also has a stage 4 sacral pressure ulcer that has had difficulty healing.   Assessment and Plan: * Multifocal pneumonia- (present on admission) - Differential includes ongoing aspiration from underlying advanced Parkinson's disease/dementia versus contribution from COVID-pneumonia - given MRSA positive swab, antibiotics were changed to IV vancomycin, cefepime, Flagyl for 7 days. -PICC line placed on 2/7 by IR -Cultures negative so far.  On IV Unasyn, however no significant improvement, x-ray showed worsening of the infiltrates.  - now on repeat course of Unasyn (started 2/10); complete 7 days and stop  COVID-19 virus infection-resolved as of 03/30/2021, (present on admission) - continue remdesivir x 5 days (completed on 2/5) - CT value 33 on admission  - fax received from [facility] that his covid test was positive on 03/01/21 and he completed isolation as well; will d/c isolation at this time   Acute respiratory failure with hypoxia ([place])- (present on admission) - Worsening hypoxia due to aspiration, PNA, COVID, severe  deconditioning  Sacral decubitus ulcer - stage 4- (present on admission) - No cellulitis or osteomyelitis on CT - follow wound care consult recommendations   Goals of care, counseling/discussion - Overall poor prognosis and functional status with high risk of aspiration and decompensation.  DNR/DNI. -GOC discussed with patient's wife on 2/10, was placed on TF's at wife's request and continued on IV Unasyn. -X-ray 2/13 showed worsening bilateral pneumonia with superimposed edema - appreciate palliative care continuing to follow; evaluated by ethics as well - prognosis remains poor - personally spent ~30 minutes talking with his wife in the hallway this morning again about his clinical status. She again was very distraught and displaying classic emotions of grief to include anger, bargaining, and denial (examples include begging me to keep abx going indefinitely, restarting IVF and nutrition; then becoming angry at my responses; then talking about his athletic abilities years ago when he was extremely active before all his decline ensued). I tried to provide empathy while listening and as much reassurance as possible that we have his best interest in hand and want him to have quality of life as well; furthermore I emphasized that some of her requests can be considered "doing harm" such as feeding him again and starting IVF, etc (which would inevitably cause further aspiration and respiratory distress)  Lung nodule - Multiple lung nodules noted on CT chest (RUL, RLL, LUL, LLL). Also has R>L pleural effusion.  Differential for effusion would include parapneumonic versus malignant -Regardless, given his extremely frail state, poor functional status, underlying dementia he would be a extremely poor treatment candidate -Dr. Sabino Gasser updated wife on  lung nodules on 2/4; per documentation she "already knew about them" and if "he never got cancer in the past he probably doesn't have it now" - per radiology  fluid collection in right pleura not enough for thoracentesis at this time  Constipation- (present on admission) - s/p enema in ER -continue Dulcolax, Senokot-S  Parkinson disease ([place])- (present on admission) - Advanced PD with dementia, significant cerebral atrophy  on CT head as well -Sinemet was increased to 4 times daily (was adjusted to QID from BID due to tremors) -Core trak currently out (since 2/13) and patient has been aspirating,  Sinemet on hold - speaks to poor prognosis  Dysphagia - Underwent SLP eval on 03/26/2021 with overt signs of dysphagia and risk for aspiration. -Tube feeds have been placed on hold again due to aspiration. No significant improvement. -Core trak out since 2/13 and will not be replaced, patient has been clearly aspirating, wife understands the risk.  Acute metabolic encephalopathy-resolved as of 03/27/2021, (present on admission) Delirium due to PNA / COVID on top of chronic parkinson's dementia.  Protein-calorie malnutrition, severe- (present on admission) - aspiration with TF; core trak has been removed   Atrial fibrillation ([place])- (present on admission) - Holding Xarelto while NPO. -Continue Lovenox for now  HTN (hypertension)- (present on admission) - BP stable     Subjective: No events overnight. Wife present bedside; see my goals of care discussion.  I spent almost 30 minutes with her in the hallway again talking about his progressive decline.  She was tearfully upset but also angry.  Continues to exhibit bargaining, anger, denial. Provided empathetic listening and as much comfort as I could give her as well as reassurance but she was still not terribly convinced.  Physical Exam: Vitals:   04/09/21 2041 04/10/21 0549 04/10/21 0800 04/10/21 1312  BP: (!) 146/63 (!) 169/63  (!) 184/73  Pulse: (!) 48   (!) 36  Resp:  12 20 (!) 22  Temp: 98.4 F (36.9 C) 98.1 F (36.7 C)  97.9 F (36.6 C)  TempSrc: Axillary Axillary  Oral  SpO2: 92%    100%  Weight:      Height:      Physical Exam Constitutional:      Comments: Chronically ill-appearing elderly gentleman lying in bed in no distress. He is less responsive today and speech very difficult to understand now. Mouth wide open.  HENT:     Head: Normocephalic and atraumatic.     Mouth/Throat:     Mouth: Mucous membranes are dry.  Eyes:     Extraocular Movements: Extraocular movements intact.  Cardiovascular:     Rate and Rhythm: Normal rate and regular rhythm.  Pulmonary:     Breath sounds: Rhonchi present. No wheezing.  Abdominal:     General: Bowel sounds are normal. There is no distension.     Palpations: Abdomen is soft.     Tenderness: There is no abdominal tenderness.  Musculoskeletal:        General: No swelling.     Cervical back: Normal range of motion and neck supple.     Comments: PICC in LUE  Skin:    General: Skin is warm.  Neurological:     Comments: Chronic underlying dementia appreciated.  Follows commands and moves all 4 extremities albeit extremely weak    Data Reviewed:  No results found for this or any previous visit (from the past 24 hour(s)).  I have Reviewed nursing notes, Vitals, and Lab results since  pt's last encounter. I have ordered test including BMP, CBC, Mg I have reviewed the last note from staff over past 24 hours I have discussed pt's care plan and test results with nursing staff, case manager   Family Communication: wife  Disposition: Status is: Inpatient Remains inpatient appropriate because: Treatment as outlined in A&P   Planned Discharge Destination:  to be determined; likely in hospital death       Author: Dwyane Dee, MD 04/10/2021 3:45 PM  For on call review [email or web address].

## 2021-04-11 DIAGNOSIS — Z7189 Other specified counseling: Secondary | ICD-10-CM | POA: Diagnosis not present

## 2021-04-11 DIAGNOSIS — J189 Pneumonia, unspecified organism: Secondary | ICD-10-CM | POA: Diagnosis not present

## 2021-04-11 DIAGNOSIS — J9601 Acute respiratory failure with hypoxia: Secondary | ICD-10-CM | POA: Diagnosis not present

## 2021-04-11 NOTE — Plan of Care (Signed)
  Problem: Clinical Measurements: Goal: Diagnostic test results will improve Outcome: Progressing   Problem: Clinical Measurements: Goal: Respiratory complications will improve Outcome: Progressing   

## 2021-04-11 NOTE — Progress Notes (Signed)
Progress Note   Patient: Chad Russell [id number] DOB: 07-Jan-1927 DOA: 03/27/2021     17 DOS: the patient was seen and examined on 04/11/2021   Brief hospital course: Chad Russell is a 88  yo male with PMH PAF, BPH, CAD, CHB, HTN, Parkinson's with dementia, OSA, arthritis who presented to the ER with worsening mentation.  He was recently admitted in December 2022 for PNA (negative covid at that time) due to presumed aspiration.  He has been less mobile since October 2022 with significant progressive decline from a mobility standpoint.  He has been residing at [facility] since discharge on 02/13/21.  He has been bedbound for several weeks since that time as well.  On workup he was reported to have increased confusion compared to baseline and associated fever.  He was found to be covid positive on workup and was started on Remdesivir. He also has a stage 4 sacral pressure ulcer that has had difficulty healing.   Assessment and Plan: * Multifocal pneumonia- (present on admission) - Differential includes ongoing aspiration from underlying advanced Parkinson's disease/dementia versus contribution from COVID-pneumonia - given MRSA positive swab, antibiotics were changed to IV vancomycin, cefepime, Flagyl for 7 days. -PICC line placed on 2/7 by IR -Cultures negative so far.  On IV Unasyn, however no significant improvement, x-ray showed worsening of the infiltrates.  - was on repeat course of Unasyn (started 2/10); completed 7 days  COVID-19 virus infection-resolved as of 03/30/2021, (present on admission) - continue remdesivir x 5 days (completed on 2/5) - CT value 33 on admission  - fax received from [facility] that his covid test was positive on 03/01/21 and he completed isolation as well; will d/c isolation at this time   Acute respiratory failure with hypoxia ([place])- (present on admission) - Worsening hypoxia due to aspiration, PNA, COVID, severe  deconditioning  Sacral decubitus ulcer - stage 4- (present on admission) - No cellulitis or osteomyelitis on CT - follow wound care consult recommendations   Goals of care, counseling/discussion - Overall poor prognosis and functional status with high risk of aspiration and decompensation.  DNR/DNI. -GOC discussed with patient's wife on 2/10, was placed on TF's at wife's request and continued on IV Unasyn. Has completed abx course and TF have been stopped due to aspiration -X-ray 2/13 showed worsening bilateral pneumonia with superimposed edema - appreciate palliative care continuing to follow; evaluated by ethics as well - prognosis remains poor  Lung nodule - Multiple lung nodules noted on CT chest (RUL, RLL, LUL, LLL). Also has R>L pleural effusion.  Differential for effusion would include parapneumonic versus malignant -Regardless, given his extremely frail state, poor functional status, underlying dementia he would be a extremely poor treatment candidate -Dr. Sabino Gasser updated wife on lung nodules on 2/4; per documentation she "already knew about them" and if "he never got cancer in the past he probably doesn't have it now" - per radiology fluid collection in right pleura not enough for thoracentesis at this time  Constipation- (present on admission) - s/p enema in ER -continue Dulcolax, Senokot-S  Parkinson disease ([place])- (present on admission) - Advanced PD with dementia, significant cerebral atrophy  on CT head as well -Sinemet was increased to 4 times daily (was adjusted to QID from BID due to tremors) -Core trak currently out (since 2/13) and patient has been aspirating,  Sinemet on hold - speaks to poor prognosis  Dysphagia - Underwent SLP eval on 03/26/2021 with overt signs of dysphagia and  risk for aspiration. -Tube feeds have been placed on hold again due to aspiration. No significant improvement. -Core trak out since 2/13 and will not be replaced, patient has been clearly  aspirating, wife understands the risk.  Acute metabolic encephalopathy-resolved as of 03/27/2021, (present on admission) Delirium due to PNA / COVID on top of chronic parkinson's dementia.  Protein-calorie malnutrition, severe- (present on admission) - aspiration with TF; core trak has been removed   Atrial fibrillation ([place])- (present on admission) - Holding Xarelto while NPO. -Continue Lovenox for now  HTN (hypertension)- (present on admission) - BP stable    Subjective: No events overnight.  Wife came in during the end of my examination and was immediately crying.  Still very clearly angry and in denial at patient's demise.  She consistently says "why don't you just put a pillow over his face and kill him". Again provided reassurance and empathetic listening.  Physical Exam: Vitals:   04/10/21 1940 04/11/21 0400 04/11/21 0500 04/11/21 0800  BP: (!) 172/71 (!) 176/69    Pulse: (!) 37     Resp: 18 16  17   Temp: 98.1 F (36.7 C) 97.7 F (36.5 C)    TempSrc: Axillary Axillary    SpO2: 100% 96%    Weight:   53.1 kg   Height:      Physical Exam Constitutional:      Comments: Chronically ill-appearing elderly gentleman now laying in bed less responsive with unintelligible speech with mouth wide open.  HENT:     Head: Normocephalic and atraumatic.     Mouth/Throat:     Mouth: Mucous membranes are dry.  Eyes:     Extraocular Movements: Extraocular movements intact.  Cardiovascular:     Rate and Rhythm: Normal rate and regular rhythm.  Pulmonary:     Breath sounds: Rhonchi present. No wheezing.  Abdominal:     General: Bowel sounds are normal. There is no distension.     Palpations: Abdomen is soft.     Tenderness: There is no abdominal tenderness.  Musculoskeletal:        General: No swelling.     Cervical back: Normal range of motion and neck supple.     Comments: PICC in LUE  Skin:    General: Skin is warm.  Neurological:     Comments: Minimally moving extremities     Data Reviewed:  No results found for this or any previous visit (from the past 24 hour(s)).  I have Reviewed nursing notes, Vitals, and Lab results since pt's last encounter. I have ordered test including BMP, CBC, Mg I have reviewed the last note from staff over past 24 hours I have discussed pt's care plan and test results with nursing staff, case manager   Family Communication: wife  Disposition: Status is: Inpatient Remains inpatient appropriate because: Treatment as outlined in A&P   Planned Discharge Destination:  to be determined; likely in hospital death     Author: Dwyane Dee, MD 04/11/2021 1:25 PM  For on call review [email or web address].

## 2021-04-12 DIAGNOSIS — G9341 Metabolic encephalopathy: Secondary | ICD-10-CM | POA: Diagnosis not present

## 2021-04-12 DIAGNOSIS — Z7189 Other specified counseling: Secondary | ICD-10-CM | POA: Diagnosis not present

## 2021-04-12 DIAGNOSIS — J9601 Acute respiratory failure with hypoxia: Secondary | ICD-10-CM | POA: Diagnosis not present

## 2021-04-12 DIAGNOSIS — J189 Pneumonia, unspecified organism: Secondary | ICD-10-CM | POA: Diagnosis not present

## 2021-04-12 NOTE — Progress Notes (Signed)
Progress Note   Patient: Chad Russell [id number]:[id number] DOB: 09-01-1926 DOA: 04/18/2021     18 DOS: the patient was seen and examined on 04/12/2021   Brief hospital course: Chad Russell is a 88  yo male with PMH PAF, BPH, CAD, CHB, HTN, Parkinson's with dementia, OSA, arthritis who presented to the ER with worsening mentation.  He was recently admitted in December 2022 for PNA (negative covid at that time) due to presumed aspiration.  He has been less mobile since October 2022 with significant progressive decline from a mobility standpoint.  He has been residing at [facility] since discharge on 02/13/21.  He has been bedbound for several weeks since that time as well.  On workup he was reported to have increased confusion compared to baseline and associated fever.  He was found to be covid positive on workup and was started on Remdesivir. He also has a stage 4 sacral pressure ulcer that has had difficulty healing.   Assessment and Plan: * Multifocal pneumonia- (present on admission) - Differential includes ongoing aspiration from underlying advanced Parkinson's disease/dementia versus contribution from COVID-pneumonia - given MRSA positive swab, antibiotics were changed to IV vancomycin, cefepime, Flagyl for 7 days. -PICC line placed on 2/7 by IR -Cultures negative so far.  On IV Unasyn, however no significant improvement, x-ray showed worsening of the infiltrates.  - was on repeat course of Unasyn (started 2/10); completed 7 days  COVID-19 virus infection-resolved as of 03/30/2021, (present on admission) - continue remdesivir x 5 days (completed on 2/5) - CT value 33 on admission  - fax received from [name] that his covid test was positive on 03/01/21 and he completed isolation as well; will d/c isolation at this time   Acute respiratory failure with hypoxia ([name])- (present on admission) - Worsening hypoxia due to aspiration, PNA, COVID, severe  deconditioning  Sacral decubitus ulcer - stage 4- (present on admission) - No cellulitis or osteomyelitis on CT - follow wound care consult recommendations   Goals of care, counseling/discussion - Overall poor prognosis and functional status with high risk of aspiration and decompensation.  DNR/DNI. -GOC discussed with patient's wife on 2/10, was placed on TF's at wife's request and continued on IV Unasyn. Has completed abx course and TF have been stopped due to aspiration -X-ray 2/13 showed worsening bilateral pneumonia with superimposed edema - appreciate palliative care continuing to follow; evaluated by ethics as well - prognosis remains poor  Lung nodule - Multiple lung nodules noted on CT chest (RUL, RLL, LUL, LLL). Also has R>L pleural effusion.  Differential for effusion would include parapneumonic versus malignant -Regardless, given his extremely frail state, poor functional status, underlying dementia he would be a extremely poor treatment candidate -Dr. Sabino Gasser updated wife on lung nodules on 2/4; per documentation she "already knew about them" and if "he never got cancer in the past he probably doesn't have it now" - per radiology fluid collection in right pleura not enough for thoracentesis at this time  Constipation- (present on admission) - s/p enema in ER  Parkinson disease ([place])- (present on admission) - Advanced PD with dementia, significant cerebral atrophy  on CT head as well -Sinemet was increased to 4 times daily (was adjusted to QID from BID due to tremors) -Core trak currently out (since 2/13) and patient has been aspirating,  Sinemet on hold - speaks to poor prognosis  Dysphagia - Underwent SLP eval on 03/26/2021 with overt signs of dysphagia and risk for aspiration. -  Tube feeds have been placed on hold again due to aspiration. No significant improvement. -Core trak out since 2/13 and will not be replaced, patient has been clearly aspirating, wife understands  the risk.  Acute metabolic encephalopathy-resolved as of 03/27/2021, (present on admission) Delirium due to PNA / COVID on top of chronic parkinson's dementia.  Protein-calorie malnutrition, severe- (present on admission) - aspiration with TF; core trak has been removed   Atrial fibrillation ([place])- (present on admission) - Holding Xarelto while NPO  HTN (hypertension)- (present on admission) - BP stable    Subjective: No events overnight. Remains minimally responsive as he was like yesterday. Briefly opened his eyes to light chest rub then went back to sleep.   Physical Exam: Vitals:   04/11/21 2029 04/12/21 0041 04/12/21 0605 04/12/21 0732  BP: (!) 177/117 (!) 169/76 132/74   Pulse: (!) 38 (!) 33 (!) 39   Resp: 20 18 20    Temp: 98.7 F (37.1 C) 99.1 F (37.3 C) 98.3 F (36.8 C)   TempSrc: Axillary Axillary Axillary   SpO2: 100% 96% 99% 96%  Weight:   55.3 kg   Height:      Physical Exam Constitutional:      Comments: Chronically ill-appearing elderly gentleman now laying in bed less responsive with unintelligible speech with mouth wide open.  HENT:     Head: Normocephalic and atraumatic.     Mouth/Throat:     Mouth: Mucous membranes are dry.  Eyes:     Extraocular Movements: Extraocular movements intact.  Cardiovascular:     Rate and Rhythm: Normal rate and regular rhythm.  Pulmonary:     Breath sounds: Rhonchi present. No wheezing.  Abdominal:     General: Bowel sounds are normal. There is no distension.     Palpations: Abdomen is soft.     Tenderness: There is no abdominal tenderness.  Musculoskeletal:        General: No swelling.     Cervical back: Normal range of motion and neck supple.     Comments: PICC in LUE  Skin:    General: Skin is warm.  Neurological:     Comments: Minimally moving extremities    Data Reviewed:  No results found for this or any previous visit (from the past 24 hour(s)).  I have Reviewed nursing notes, Vitals, and Lab results  since pt's last encounter. I have reviewed the last note from staff over past 24 hours I have discussed pt's care plan and test results with nursing staff, case manager   Family Communication: wife  Disposition: Status is: Inpatient Remains inpatient appropriate because: Treatment as outlined in A&P   Planned Discharge Destination:  expected in-hospital death     Author: Dwyane Dee, MD 04/12/2021 12:31 PM  For on call review [email or web address].

## 2021-04-13 DIAGNOSIS — Z7189 Other specified counseling: Secondary | ICD-10-CM | POA: Diagnosis not present

## 2021-04-13 DIAGNOSIS — J9601 Acute respiratory failure with hypoxia: Secondary | ICD-10-CM | POA: Diagnosis not present

## 2021-04-13 DIAGNOSIS — G9341 Metabolic encephalopathy: Secondary | ICD-10-CM | POA: Diagnosis not present

## 2021-04-13 DIAGNOSIS — J189 Pneumonia, unspecified organism: Secondary | ICD-10-CM | POA: Diagnosis not present

## 2021-04-13 NOTE — Plan of Care (Signed)
  Problem: Clinical Measurements: Goal: Diagnostic test results will improve Outcome: Progressing   Problem: Clinical Measurements: Goal: Respiratory complications will improve Outcome: Progressing   

## 2021-04-13 NOTE — Progress Notes (Signed)
NUTRITION NOTE  Last full follow-up completed on 2/14. Patient's small bore NGT became dislodged on 2/13 and remains out at this time with no plans for replacement. He remains NPO since admission.   MD note from 07-May-2022 indicates hospital death is anticipated. Patient is DNR/DNI.  No nutrition-related interventions at this time and RD will sign off. If nutrition-related needs arise, please re-consult RD.     Jarome Matin, MS, RD, LDN Inpatient Clinical Dietitian RD pager # available in [place]  After hours/weekend pager # available in [facility]

## 2021-04-13 NOTE — Progress Notes (Signed)
Progress Note   Patient: Chad Russell JSE:[id number] DOB: [birth year] DOA: 04/15/2021     19 DOS: the patient was seen and examined on 04/13/2021   Brief hospital course: Chad Russell is a 88  yo male with PMH PAF, BPH, CAD, CHB, HTN, Parkinson's with dementia, OSA, arthritis who presented to the ER with worsening mentation.  He was recently admitted in December 2022 for PNA (negative covid at that time) due to presumed aspiration.  He has been less mobile since October 2022 with significant progressive decline from a mobility standpoint.  He has been residing at [facility] since discharge on 02/13/21.  He has been bedbound for several weeks since that time as well.  On workup he was reported to have increased confusion compared to baseline and associated fever.  He was found to be covid positive on workup and was started on Remdesivir. He also has a stage 4 sacral pressure ulcer that has had difficulty healing.   Assessment and Plan: * Multifocal pneumonia- (present on admission) - Differential includes ongoing aspiration from underlying advanced Parkinson's disease/dementia versus contribution from COVID-pneumonia - given MRSA positive swab, antibiotics were changed to IV vancomycin, cefepime, Flagyl for 7 days. -PICC line placed on 2/7 by IR -Cultures negative so far.  On IV Unasyn, however no significant improvement, x-ray showed worsening of the infiltrates.  - was on repeat course of Unasyn (started 2/10); completed 7 days  COVID-19 virus infection-resolved as of 03/30/2021, (present on admission) - continue remdesivir x 5 days (completed on 2/5) - CT value 33 on admission  - fax received from [facility] that his covid test was positive on 03/01/21 and he completed isolation as well; will d/c isolation at this time   Acute respiratory failure with hypoxia ([place])- (present on admission) - Worsening hypoxia due to aspiration, PNA, COVID, severe  deconditioning  Sacral decubitus ulcer - stage 4- (present on admission) - No cellulitis or osteomyelitis on CT - follow wound care consult recommendations   Goals of care, counseling/discussion - Overall poor prognosis and functional status with high risk of aspiration and decompensation.  DNR/DNI. -GOC discussed with patient's wife on 2/10, was placed on TF's at wife's request and continued on IV Unasyn. Has completed abx course and TF have been stopped due to aspiration -X-ray 2/13 showed worsening bilateral pneumonia with superimposed edema - appreciate palliative care continuing to follow; evaluated by ethics as well - prognosis remains poor  Lung nodule - Multiple lung nodules noted on CT chest (RUL, RLL, LUL, LLL). Also has R>L pleural effusion.  Differential for effusion would include parapneumonic versus malignant -Regardless, given his extremely frail state, poor functional status, underlying dementia he would be a extremely poor treatment candidate -Dr. Sabino Gasser updated wife on lung nodules on 2/4; per documentation she "already knew about them" and if "he never got cancer in the past he probably doesn't have it now" - per radiology fluid collection in right pleura not enough for thoracentesis at this time  Constipation- (present on admission) - s/p enema in ER  Parkinson disease ([place])- (present on admission) - Advanced PD with dementia, significant cerebral atrophy  on CT head as well -Sinemet was increased to 4 times daily (was adjusted to QID from BID due to tremors) -Core trak currently out (since 2/13) and patient has been aspirating,  Sinemet on hold - speaks to poor prognosis  Dysphagia - Underwent SLP eval on 03/26/2021 with overt signs of dysphagia and risk for aspiration. -  Tube feeds have been placed on hold again due to aspiration. No significant improvement. -Core trak out since 2/13 and will not be replaced, patient has been clearly aspirating, wife understands  the risk.  Acute metabolic encephalopathy-resolved as of 03/27/2021, (present on admission) Delirium due to PNA / COVID on top of chronic parkinson's dementia.  Protein-calorie malnutrition, severe- (present on admission) - aspiration with TF; core trak has been removed   Atrial fibrillation ([place])- (present on admission) - Holding Xarelto while NPO  HTN (hypertension)- (present on admission) - BP stable    Subjective: No events overnight. Remains minimally responsive as he was like yesterday.  Wife essentially cussed me out as usual in the hallway for not putting him on fluids and antibiotics. Tells me "you don't care about him and just want him to die".  I again calmly tried explaining to her why the medical team believes that fluids would be a harm to him and that antibiotics are medically futile at this time and he has completed sufficient courses without any meaningful improvement. She listened but then begged for fluids still and told me "you shouldn't be a doctor, you don't care about him".   Physical Exam: Vitals:   04/13/21 0338 04/13/21 0341 04/13/21 0800 04/13/21 1242  BP: (!) 171/73   (!) 161/97  Pulse: (!) 33   86  Resp: 20  (!) 29 20  Temp: 98.2 F (36.8 C)   97.6 F (36.4 C)  TempSrc: Oral   Axillary  SpO2: 98%  99%   Weight:  61.2 kg    Height:      Physical Exam Constitutional:      Comments: Chronically ill-appearing elderly gentleman now laying in bed less responsive with unintelligible speech with mouth wide open.  HENT:     Head: Normocephalic and atraumatic.     Mouth/Throat:     Mouth: Mucous membranes are dry.  Eyes:     Extraocular Movements: Extraocular movements intact.  Cardiovascular:     Rate and Rhythm: Normal rate and regular rhythm.  Pulmonary:     Breath sounds: Rhonchi present. No wheezing.  Abdominal:     General: Bowel sounds are normal. There is no distension.     Palpations: Abdomen is soft.     Tenderness: There is no abdominal  tenderness.  Musculoskeletal:        General: No swelling.     Cervical back: Normal range of motion and neck supple.     Comments: PICC in LUE  Skin:    General: Skin is warm.  Neurological:     Comments: Minimally moving extremities    Data Reviewed:  No results found for this or any previous visit (from the past 24 hour(s)).  I have Reviewed nursing notes, Vitals, and Lab results since pt's last encounter. I have reviewed the last note from staff over past 24 hours I have discussed pt's care plan and test results with nursing staff, case manager   Family Communication: wife  Disposition: Status is: Inpatient Remains inpatient appropriate because: Treatment as outlined in A&P   Planned Discharge Destination:  expected in-hospital death     Author: Dwyane Dee, MD 04/13/2021 1:52 PM  For on call review [email or web address].

## 2021-04-14 DIAGNOSIS — G9341 Metabolic encephalopathy: Secondary | ICD-10-CM | POA: Diagnosis not present

## 2021-04-14 DIAGNOSIS — Z7189 Other specified counseling: Secondary | ICD-10-CM | POA: Diagnosis not present

## 2021-04-14 DIAGNOSIS — J189 Pneumonia, unspecified organism: Secondary | ICD-10-CM | POA: Diagnosis not present

## 2021-04-14 DIAGNOSIS — J9601 Acute respiratory failure with hypoxia: Secondary | ICD-10-CM | POA: Diagnosis not present

## 2021-04-14 MED ORDER — MORPHINE SULFATE (PF) 2 MG/ML IV SOLN
1.0000 mg | Freq: Two times a day (BID) | INTRAVENOUS | Status: DC
  Administered 2021-04-14: 1 mg via INTRAVENOUS
  Filled 2021-04-14: qty 1

## 2021-04-14 MED ORDER — MORPHINE SULFATE (PF) 2 MG/ML IV SOLN
1.0000 mg | Freq: Two times a day (BID) | INTRAVENOUS | Status: DC
  Administered 2021-04-14 – 2021-04-26 (×22): 1 mg via INTRAVENOUS
  Filled 2021-04-14 (×22): qty 1

## 2021-04-14 NOTE — TOC Progression Note (Signed)
Transition of Care [facility]) - Progression Note    Patient Details  Name: Chad Russell MRN: [id number] Date of Birth: 30-Mar-1926  Transition [facility] CM/SW Contact  Leeroy Cha, RN Phone Number: 04/14/2021, 9:29 AM  Clinical Narrative:    Following for toc needs in hospital death is planned.   Expected Discharge Plan: Home/Self Care Barriers to Discharge: Continued Medical Work up  Expected Discharge Plan and Services Expected Discharge Plan: Home/Self Care   Discharge Planning Services: CM Consult   Living arrangements for the past 2 months: Single Family Home                                       Social Determinants of Health (SDOH) Interventions    Readmission Risk Interventions No flowsheet data found.

## 2021-04-14 NOTE — Progress Notes (Signed)
Progress Note   Patient: Chad Russell [id number]:[id number] DOB: 08-Sep-1926 DOA: 04/17/2021     20 DOS: the patient was seen and examined on 04/14/2021   Brief hospital course: [name] is a 88  yo male with PMH PAF, BPH, CAD, CHB, HTN, Parkinson's with dementia, OSA, arthritis who presented to the ER with worsening mentation.  He was recently admitted in December 2022 for PNA (negative covid at that time) due to presumed aspiration.  He has been less mobile since October 2022 with significant progressive decline from a mobility standpoint.  He has been residing at [facility] since discharge on 02/13/21.  He has been bedbound for several weeks since that time as well.  On workup he was reported to have increased confusion compared to baseline and associated fever.  He was found to be covid positive on workup and was started on Remdesivir. He also has a stage 4 sacral pressure ulcer that has had difficulty healing.   Assessment and Plan: * Multifocal pneumonia- (present on admission) - Differential includes ongoing aspiration from underlying advanced Parkinson's disease/dementia versus contribution from COVID-pneumonia - given MRSA positive swab, antibiotics were changed to IV vancomycin, cefepime, Flagyl for 7 days. -PICC line placed on 2/7 by IR -Cultures negative so far.  On IV Unasyn, however no significant improvement, x-ray showed worsening of the infiltrates.  - was on repeat course of Unasyn (started 2/10); completed 7 days  COVID-19 virus infection-resolved as of 03/30/2021, (present on admission) - continue remdesivir x 5 days (completed on 2/5) - CT value 33 on admission  - fax received from [facility] that his covid test was positive on 03/01/21 and he completed isolation as well; will d/c isolation at this time   Acute respiratory failure with hypoxia ([place])- (present on admission) - Worsening hypoxia due to aspiration, PNA, COVID, severe  deconditioning  Sacral decubitus ulcer - stage 4- (present on admission) - No cellulitis or osteomyelitis on CT - follow wound care consult recommendations   Goals of care, counseling/discussion - Overall poor prognosis and functional status with high risk of aspiration and decompensation.  DNR/DNI. -GOC discussed with patient's wife on 2/10, was placed on TF's at wife's request and continued on IV Unasyn. Has completed abx course and TF have been stopped due to aspiration -X-ray 2/13 showed worsening bilateral pneumonia with superimposed edema - appreciate palliative care continuing to follow; evaluated by ethics as well - prognosis remains poor  Lung nodule - Multiple lung nodules noted on CT chest (RUL, RLL, LUL, LLL). Also has R>L pleural effusion.  Differential for effusion would include parapneumonic versus malignant -Regardless, given his extremely frail state, poor functional status, underlying dementia he would be a extremely poor treatment candidate -Dr. Sabino Gasser updated wife on lung nodules on 2/4; per documentation she "already knew about them" and if "he never got cancer in the past he probably doesn't have it now" - per radiology fluid collection in right pleura not enough for thoracentesis at this time  Constipation- (present on admission) - s/p enema in ER  Parkinson disease ([place])- (present on admission) - Advanced PD with dementia, significant cerebral atrophy  on CT head as well -Sinemet was increased to 4 times daily (was adjusted to QID from BID due to tremors) -Core trak currently out (since 2/13) and patient has been aspirating,  Sinemet on hold - speaks to poor prognosis  Dysphagia - Underwent SLP eval on 03/26/2021 with overt signs of dysphagia and risk for aspiration. -  Tube feeds have been placed on hold again due to aspiration. No significant improvement. -Core trak out since 2/13 and will not be replaced, patient has been clearly aspirating, wife understands  the risk.  Acute metabolic encephalopathy-resolved as of 03/27/2021, (present on admission) Delirium due to PNA / COVID on top of chronic parkinson's dementia.  Protein-calorie malnutrition, severe- (present on admission) - aspiration with TF; core trak has been removed   Atrial fibrillation ([place])- (present on admission) - Holding Xarelto while NPO  HTN (hypertension)- (present on admission) - BP stable    Subjective: No events overnight. Wife present this am; again upset with me asking if I had started antibiotics yesterday to which I said "no" and again explained reasoning but she's fixated still on not wanting him to get "septic".  He was started on low dose scheduled morphine yesterday too for the discomfort that was being reported by his wife.   Physical Exam: Vitals:   04/13/21 2133 04/14/21 0300 04/14/21 0437 04/14/21 1228  BP: (!) 183/67  (!) 168/67 (!) 163/65  Pulse: 70  73 (!) 35  Resp:   18 16  Temp: (!) 97.3 F (36.3 C)  98.5 F (36.9 C) 97.9 F (36.6 C)  TempSrc: Oral  Oral Axillary  SpO2: 100%  100% 100%  Weight:  70.3 kg    Height:      Physical Exam Constitutional:      Comments: Chronically ill-appearing elderly gentleman now laying in bed less responsive with very difficult to understand speech with mouth wide open.  HENT:     Head: Normocephalic and atraumatic.     Mouth/Throat:     Mouth: Mucous membranes are dry.  Eyes:     Extraocular Movements: Extraocular movements intact.  Cardiovascular:     Rate and Rhythm: Normal rate and regular rhythm.  Pulmonary:     Breath sounds: Rhonchi present. No wheezing.  Abdominal:     General: Bowel sounds are normal. There is no distension.     Palpations: Abdomen is soft.     Tenderness: There is no abdominal tenderness.  Musculoskeletal:        General: No swelling.     Cervical back: Normal range of motion and neck supple.     Comments: PICC in LUE  Skin:    General: Skin is warm.  Neurological:      Comments: Minimally moving extremities    Data Reviewed:  No results found for this or any previous visit (from the past 24 hour(s)).  I have Reviewed nursing notes, Vitals, and Lab results since pt's last encounter. I have reviewed the last note from staff over past 24 hours I have discussed pt's care plan and test results with nursing staff, case manager   Family Communication: wife  Disposition: Status is: Inpatient Remains inpatient appropriate because: Treatment as outlined in A&P   Planned Discharge Destination:  expected in-hospital death     Author: Dwyane Dee, MD 04/14/2021 1:16 PM  For on call review [email or web address].

## 2021-04-14 NOTE — Progress Notes (Signed)
Palliative Care Progress Note  Mr. Apostol continue to languish and decline, although today he is able to respond to me and answer simple questions. He describes pain in his backside and where his PICC is located. His mouth is very dry-he asks me for a "beer". I provided sips of water and cleaned his mouth. His wife continue to keep vigil at bedside-she knows he is dying, but asks for hydration and antibiotics. I explained how these things can cause harm. She believes he is suffering without these things- I explained we could treat his discomfort and she has agreed to comfort medications-and that comfort is in fact our goal here and really what we can do for him. His condition is not reversible.  Recommendations:  Grief support for his wife-she has been at bedside for days and she looks exhausted. Scheduled Morphine q12 hours. Oral care, comfort fluids as tolerated.   Lane Hacker, DO Palliative Medicine

## 2021-04-15 DIAGNOSIS — J189 Pneumonia, unspecified organism: Secondary | ICD-10-CM | POA: Diagnosis not present

## 2021-04-15 DIAGNOSIS — Z515 Encounter for palliative care: Secondary | ICD-10-CM | POA: Diagnosis not present

## 2021-04-15 NOTE — Progress Notes (Signed)
Chaplain encountered Chad Russell's wife in the hallway and provided support.  She stated "He is holding on and he will keep holding on."  Chaplain provided listening as she shared about how difficult it is to watch him die every day.  Chaplains will continue to follow, but please also page as needs arise.  [address], [place] Pager, [phone]

## 2021-04-15 NOTE — Progress Notes (Signed)
PROGRESS NOTE    Chad SHERIDAN  ZYS:[id number] DOB: [birth year] DOA: 04/07/2021 PCP: Garwin Brothers, MD   Brief Narrative:  88  yo male with PMH PAF, BPH, CAD, CHB, HTN, Parkinson's with dementia, OSA, arthritis, possible aspiration pneumonia in December 2022 requiring admission, overall significant progressive decline since October 2022 and currently bedbound for several weeks presented with worsening mental status.  On presentation, he was found to have possible COVID-19 pneumonia versus aspiration pneumonia.  He was treated with broad-spectrum antibiotics and has completed treatment with remdesivir and completed isolation as well.  Palliative care consulted for goals of care discussion.  Overall prognosis very poor.  He has been switched to comfort measures from 04/14/2021 onwards.  Assessment & Plan:   Comfort measures only status Multifocal pneumonia secondary to aspiration pneumonia and COVID-19 pneumonia Acute respiratory failure with hypoxia Stage IV sacral decubitus ulcer: Present on admission Lung nodule Constipation Dysphagia Acute metabolic encephalopathy Parkinson's disease with dementia Severe protein calorie malnutrition Paroxysmal A-fib Hypertension  Plan -As discussed above, patient has been transitioned to comfort measures from 04/14/2021 onwards.  Palliative care following.  Expect in-hospital death.    DVT prophylaxis: None for comfort measures Code Status: DNR Family Communication: None at bedside Disposition Plan: Status is: Inpatient Remains inpatient appropriate because: Of severity of illness.  Expect in-hospital death.  Consultants: Palliative care  Procedures: None  Antimicrobials: None currently.  Completed antibiotic treatment   Subjective: Patient seen and examined at bedside.  Looks comfortable.  No signs of agitation.  Wakes up minimally, does not participate in conversation.  Objective: Vitals:   04/14/21 0437 04/14/21 1228 04/14/21 2102  04/15/21 0448  BP: (!) 168/67 (!) 163/65 (!) 169/84   Pulse: 73 (!) 35 (!) 38   Resp: 18 16 20    Temp: 98.5 F (36.9 C) 97.9 F (36.6 C) 98.3 F (36.8 C)   TempSrc: Oral Axillary Oral   SpO2: 100% 100% 95%   Weight:    70.8 kg  Height:        Intake/Output Summary (Last 24 hours) at 04/15/2021 1218 Last data filed at 04/15/2021 0400 Gross per 24 hour  Intake --  Output 550 ml  Net -550 ml   Filed Weights   04/13/21 0341 04/14/21 0300 04/15/21 0448  Weight: 61.2 kg 70.3 kg 70.8 kg    Examination:  General exam: Looks comfortable.  No signs of agitation.  Wakes up minimally, does not participate in conversation.   Data Reviewed: I have personally reviewed following labs and imaging studies  CBC: No results for input(s): WBC, NEUTROABS, HGB, HCT, MCV, PLT in the last 168 hours. Basic Metabolic Panel: No results for input(s): NA, K, CL, CO2, GLUCOSE, BUN, CREATININE, CALCIUM, MG, PHOS in the last 168 hours. GFR: Estimated Creatinine Clearance: 59 mL/min (by C-G formula based on SCr of 0.69 mg/dL). Liver Function Tests: No results for input(s): AST, ALT, ALKPHOS, BILITOT, PROT, ALBUMIN in the last 168 hours. No results for input(s): LIPASE, AMYLASE in the last 168 hours. No results for input(s): AMMONIA in the last 168 hours. Coagulation Profile: No results for input(s): INR, PROTIME in the last 168 hours. Cardiac Enzymes: No results for input(s): CKTOTAL, CKMB, CKMBINDEX, TROPONINI in the last 168 hours. BNP (last 3 results) No results for input(s): PROBNP in the last 8760 hours. HbA1C: No results for input(s): HGBA1C in the last 72 hours. CBG: No results for input(s): GLUCAP in the last 168 hours. Lipid Profile: No results for input(s): CHOL, HDL,  LDLCALC, TRIG, CHOLHDL, LDLDIRECT in the last 72 hours. Thyroid Function Tests: No results for input(s): TSH, T4TOTAL, FREET4, T3FREE, THYROIDAB in the last 72 hours. Anemia Panel: No results for input(s): VITAMINB12,  FOLATE, FERRITIN, TIBC, IRON, RETICCTPCT in the last 72 hours. Sepsis Labs: No results for input(s): PROCALCITON, LATICACIDVEN in the last 168 hours.  No results found for this or any previous visit (from the past 240 hour(s)).       Radiology Studies: No results found.      Scheduled Meds:  Chlorhexidine Gluconate Cloth  6 each Topical Daily    morphine injection  1 mg Intravenous q12n4p   scopolamine  1 patch Transdermal Q72H   timolol  1 drop Both Eyes QHS   Continuous Infusions:        Aline August, MD [facility] 04/15/2021, 12:18 PM

## 2021-04-16 DIAGNOSIS — J189 Pneumonia, unspecified organism: Secondary | ICD-10-CM | POA: Diagnosis not present

## 2021-04-16 DIAGNOSIS — Z515 Encounter for palliative care: Secondary | ICD-10-CM

## 2021-04-16 NOTE — Progress Notes (Signed)
°  PROGRESS NOTE    Chad Russell  XKG:[id number] DOB: January 10, 1927 DOA: 03/26/2021 PCP: Garwin Brothers, MD   Brief Narrative:  88  yo male with PMH PAF, BPH, CAD, CHB, HTN, Parkinson's with dementia, OSA, arthritis, possible aspiration pneumonia in December 2022 requiring admission, overall significant progressive decline since October 2022 and currently bedbound for several weeks presented with worsening mental status.  On presentation, he was found to have possible COVID-19 pneumonia versus aspiration pneumonia.  He was treated with broad-spectrum antibiotics and has completed treatment with remdesivir and completed isolation as well.  Palliative care consulted for goals of care discussion.  Overall prognosis very poor.  He has been switched to comfort measures from 04/14/2021 onwards.  Assessment & Plan:   Comfort measures only status Multifocal pneumonia secondary to aspiration pneumonia and COVID-19 pneumonia Acute respiratory failure with hypoxia Stage IV sacral decubitus ulcer: Present on admission Lung nodule Constipation Dysphagia Acute metabolic encephalopathy Parkinson's disease with dementia Severe protein calorie malnutrition Paroxysmal A-fib Hypertension  Plan -As discussed above, patient has been transitioned to comfort measures from 04/14/2021 onwards.  Palliative care following.  Expect in-hospital death.    DVT prophylaxis: None for comfort measures Code Status: DNR Family Communication: None at bedside Disposition Plan: Status is: Inpatient Remains inpatient appropriate because: Of severity of illness.  Expect in-hospital death.  Consultants: Palliative care  Procedures: None  Antimicrobials: None currently.  Completed antibiotic treatment   Subjective: Patient seen and examined at bedside.  No overnight fever, seizures or agitation reported. Objective: Vitals:   04/14/21 2102 04/15/21 0448 04/15/21 1417 04/15/21 2201  BP: (!) 169/84  (!) 165/74 (!) 160/76   Pulse: (!) 38   (!) 51  Resp: 20  18 18   Temp: 98.3 F (36.8 C)  98.3 F (36.8 C) 98.5 F (36.9 C)  TempSrc: Oral  Oral Axillary  SpO2: 95%     Weight:  70.8 kg    Height:        Intake/Output Summary (Last 24 hours) at 04/16/2021 0731 Last data filed at 04/16/2021 0500 Gross per 24 hour  Intake --  Output 600 ml  Net -600 ml    Filed Weights   04/13/21 0341 04/14/21 0300 04/15/21 0448  Weight: 61.2 kg 70.3 kg 70.8 kg    Examination:  General exam: Currently looks comfortable with no signs of agitation.  Hardly participates in any conversation.   Scheduled Meds:  Chlorhexidine Gluconate Cloth  6 each Topical Daily    morphine injection  1 mg Intravenous q12n4p   scopolamine  1 patch Transdermal Q72H   timolol  1 drop Both Eyes QHS   Continuous Infusions:        Aline August, MD [facility] 04/16/2021, 7:31 AM

## 2021-04-17 DIAGNOSIS — Z515 Encounter for palliative care: Secondary | ICD-10-CM | POA: Diagnosis not present

## 2021-04-17 DIAGNOSIS — J189 Pneumonia, unspecified organism: Secondary | ICD-10-CM | POA: Diagnosis not present

## 2021-04-17 NOTE — Progress Notes (Signed)
I talked with patient's wife, Ms. Mardene Celeste. She was concerned that the doctor did not show compassion. ON the first day they came here, the doctor asked, "what would you do if his heart stops today?"   She was concerned  Patient has not had any food, water or any nutrition. She asked why can't he have nutrition provided for him. We talked about how he was a very strong person. 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 He was in great physical health, used to be a Teaching laboratory technician and climbed mountains.   He was infected by poison ivy and a spider, then he suffered from falls. Ms. Mardene Celeste was worried that adequate medical help was not arranged for him.   They used to grow up in [employer] churches in [place], and here they haven't been regularly going, although she would like to.  She mentioned to me, "Please say a prayer for him" Ms. Mardene Celeste is heartbroken and sad. We both prayed together.   [place] Marilyn Nihiser

## 2021-04-17 NOTE — Progress Notes (Signed)
°  PROGRESS NOTE    DIAGO HAIK  YIA:[id number] DOB: December 16, 1926 DOA: 04/21/2021 PCP: Garwin Brothers, MD   Brief Narrative:  88  yo male with PMH PAF, BPH, CAD, CHB, HTN, Parkinson's with dementia, OSA, arthritis, possible aspiration pneumonia in December 2022 requiring admission, overall significant progressive decline since October 2022 and currently bedbound for several weeks presented with worsening mental status.  On presentation, he was found to have possible COVID-19 pneumonia versus aspiration pneumonia.  He was treated with broad-spectrum antibiotics and has completed treatment with remdesivir and completed isolation as well.  Palliative care consulted for goals of care discussion.  Overall prognosis very poor.  He has been switched to comfort measures from 04/14/2021 onwards.  Assessment & Plan:   Comfort measures only status Multifocal pneumonia secondary to aspiration pneumonia and COVID-19 pneumonia Acute respiratory failure with hypoxia Stage IV sacral decubitus ulcer: Present on admission Lung nodule Constipation Dysphagia Acute metabolic encephalopathy Parkinson's disease with dementia Severe protein calorie malnutrition Paroxysmal A-fib Hypertension  Plan -As discussed above, patient has been transitioned to comfort measures from 04/14/2021 onwards.  Palliative care following.  Expect in-hospital death.    DVT prophylaxis: None for comfort measures Code Status: DNR Family Communication: None at bedside Disposition Plan: Status is: Inpatient Remains inpatient appropriate because: Of severity of illness.  Expect in-hospital death.  Consultants: Palliative care  Procedures: None  Antimicrobials: None currently.  Completed antibiotic treatment   Subjective: Patient seen and examined at bedside.  Confused and almost unresponsive.   Objective: Vitals:   04/16/21 1832 04/16/21 2038 04/17/21 0429 04/17/21 0438  BP: (!) 160/86 133/60    Pulse: (!) 39 (!) 41     Resp: 18 18    Temp:  98.2 F (36.8 C)    TempSrc:      SpO2: 98%     Weight:   (P) 70.8 kg 71.7 kg  Height:        Intake/Output Summary (Last 24 hours) at 04/17/2021 0728 Last data filed at 04/17/2021 0429 Gross per 24 hour  Intake --  Output 350 ml  Net -350 ml    Filed Weights   04/15/21 0448 04/17/21 0429 04/17/21 0438  Weight: 70.8 kg (P) 70.8 kg 71.7 kg    Examination:  General exam: Currently not agitated.  Looks comfortable.  Confused.  Scheduled Meds:  Chlorhexidine Gluconate Cloth  6 each Topical Daily    morphine injection  1 mg Intravenous q12n4p   scopolamine  1 patch Transdermal Q72H   timolol  1 drop Both Eyes QHS   Continuous Infusions:        Aline August, MD [facility] 04/17/2021, 7:28 AM

## 2021-04-18 DIAGNOSIS — J189 Pneumonia, unspecified organism: Secondary | ICD-10-CM | POA: Diagnosis not present

## 2021-04-18 DIAGNOSIS — Z515 Encounter for palliative care: Secondary | ICD-10-CM | POA: Diagnosis not present

## 2021-04-18 NOTE — Progress Notes (Signed)
°  PROGRESS NOTE    Chad Russell  JQZ:[id number] DOB: 08/15/1926 DOA: 04/05/2021 PCP: Garwin Brothers, MD   Brief Narrative:  88  yo male with PMH PAF, BPH, CAD, CHB, HTN, Parkinson's with dementia, OSA, arthritis, possible aspiration pneumonia in December 2022 requiring admission, overall significant progressive decline since October 2022 and currently bedbound for several weeks presented with worsening mental status.  On presentation, he was found to have possible COVID-19 pneumonia versus aspiration pneumonia.  He was treated with broad-spectrum antibiotics and has completed treatment with remdesivir and completed isolation as well.  Palliative care consulted for goals of care discussion.  Overall prognosis very poor.  He has been switched to comfort measures from 04/14/2021 onwards.  Assessment & Plan:   Comfort measures only status Multifocal pneumonia secondary to aspiration pneumonia and COVID-19 pneumonia Acute respiratory failure with hypoxia Stage IV sacral decubitus ulcer: Present on admission Lung nodule Constipation Dysphagia Acute metabolic encephalopathy Parkinson's disease with dementia Severe protein calorie malnutrition Paroxysmal A-fib Hypertension  Plan -As discussed above, patient has been transitioned to comfort measures from 04/14/2021 onwards.  Palliative care following.  Expect in-hospital death.    DVT prophylaxis: None for comfort measures Code Status: DNR Family Communication: None at bedside Disposition Plan: Status is: Inpatient Remains inpatient appropriate because: Of severity of illness.  Expect in-hospital death.  Consultants: Palliative care  Procedures: None  Antimicrobials: None currently.  Completed antibiotic treatment   Subjective: Patient seen and examined at bedside.  Still confused and almost unresponsive  objective: Vitals:   04/17/21 0438 04/17/21 1752 04/18/21 0500 04/18/21 0618  BP:  103/64  129/66  Pulse:  94  76  Resp:  20   16  Temp:  99.3 F (37.4 C)  98.7 F (37.1 C)  TempSrc:  Axillary  Oral  SpO2:  96%  (!) 76%  Weight: 71.7 kg  68 kg   Height:        Intake/Output Summary (Last 24 hours) at 04/18/2021 0726 Last data filed at 04/18/2021 6226 Gross per 24 hour  Intake 0 ml  Output 350 ml  Net -350 ml    Filed Weights   04/17/21 0429 04/17/21 0438 04/18/21 0500  Weight: 70.8 kg 71.7 kg 68 kg    Examination:  General exam: No signs of agitation currently.  Currently looks comfortable. Scheduled Meds:  Chlorhexidine Gluconate Cloth  6 each Topical Daily    morphine injection  1 mg Intravenous q12n4p   scopolamine  1 patch Transdermal Q72H   timolol  1 drop Both Eyes QHS   Continuous Infusions:        Aline August, MD [facility] 04/18/2021, 7:26 AM

## 2021-04-19 DIAGNOSIS — Z515 Encounter for palliative care: Secondary | ICD-10-CM | POA: Diagnosis not present

## 2021-04-19 DIAGNOSIS — J189 Pneumonia, unspecified organism: Secondary | ICD-10-CM | POA: Diagnosis not present

## 2021-04-19 NOTE — Progress Notes (Signed)
°  PROGRESS NOTE    Chad Russell  ULA:[id number] DOB: 1926-04-22 DOA: 04/04/2021 PCP: Garwin Brothers, MD   Brief Narrative:  88  yo male with PMH PAF, BPH, CAD, CHB, HTN, Parkinson's with dementia, OSA, arthritis, possible aspiration pneumonia in December 2022 requiring admission, overall significant progressive decline since October 2022 and currently bedbound for several weeks presented with worsening mental status.  On presentation, he was found to have possible COVID-19 pneumonia versus aspiration pneumonia.  He was treated with broad-spectrum antibiotics and has completed treatment with remdesivir and completed isolation as well.  Palliative care consulted for goals of care discussion.  Overall prognosis very poor.  He has been switched to comfort measures from 04/14/2021 onwards.  Assessment & Plan:   Comfort measures only status Multifocal pneumonia secondary to aspiration pneumonia and COVID-19 pneumonia Acute respiratory failure with hypoxia Stage IV sacral decubitus ulcer: Present on admission Lung nodule Constipation Dysphagia Acute metabolic encephalopathy Parkinson's disease with dementia Severe protein calorie malnutrition Paroxysmal A-fib Hypertension  Plan -As discussed above, patient has been transitioned to comfort measures from 04/14/2021 onwards.  Palliative care following.  Expect in-hospital death.    DVT prophylaxis: None for comfort measures Code Status: DNR Family Communication: None at bedside Disposition Plan: Status is: Inpatient Remains inpatient appropriate because: Of severity of illness.  Expect in-hospital death.  Consultants: Palliative care  Procedures: None  Antimicrobials: None currently.  Completed antibiotic treatment   Subjective: Patient seen and examined at bedside.  No agitation reported.  Confused. objective: Vitals:   04/18/21 0618 04/18/21 1215 04/19/21 0442 04/19/21 0443  BP: 129/66 (!) 114/58 137/68   Pulse: 76 (!) 104 80    Resp: 16 16 16    Temp: 98.7 F (37.1 C) 98.2 F (36.8 C) 99.4 F (37.4 C)   TempSrc: Oral Oral Oral   SpO2: (!) 76% 96% 94%   Weight:    53.1 kg  Height:        Intake/Output Summary (Last 24 hours) at 04/19/2021 0750 Last data filed at 04/19/2021 0407 Gross per 24 hour  Intake 0 ml  Output 200 ml  Net -200 ml    Filed Weights   04/17/21 0438 04/18/21 0500 04/19/21 0443  Weight: 71.7 kg 68 kg 53.1 kg    Examination:  General exam: Looks confused but comfortable.  Not agitated currently.    Scheduled Meds:  Chlorhexidine Gluconate Cloth  6 each Topical Daily    morphine injection  1 mg Intravenous q12n4p   scopolamine  1 patch Transdermal Q72H   timolol  1 drop Both Eyes QHS   Continuous Infusions:        Aline August, MD [facility] 04/19/2021, 7:50 AM

## 2021-04-19 NOTE — Plan of Care (Signed)
?  Problem: Clinical Measurements: ?Goal: Will remain free from infection ?Outcome: Progressing ?  ?

## 2021-04-20 DIAGNOSIS — J189 Pneumonia, unspecified organism: Secondary | ICD-10-CM | POA: Diagnosis not present

## 2021-04-20 DIAGNOSIS — Z515 Encounter for palliative care: Secondary | ICD-10-CM | POA: Diagnosis not present

## 2021-04-20 NOTE — Progress Notes (Signed)
°  PROGRESS NOTE    Chad Russell  [id number]:[id number] DOB: 03-Jan-1927 DOA: 04/12/2021 PCP: Garwin Brothers, MD   Brief Narrative:  88  yo male with PMH PAF, BPH, CAD, CHB, HTN, Parkinson's with dementia, OSA, arthritis, possible aspiration pneumonia in December 2022 requiring admission, overall significant progressive decline since October 2022 and currently bedbound for several weeks presented with worsening mental status.  On presentation, he was found to have possible COVID-19 pneumonia versus aspiration pneumonia.  He was treated with broad-spectrum antibiotics and has completed treatment with remdesivir and completed isolation as well.  Palliative care consulted for goals of care discussion.  Overall prognosis very poor.  He has been switched to comfort measures from 04/14/2021 onwards.  Assessment & Plan:   Comfort measures only status Multifocal pneumonia secondary to aspiration pneumonia and COVID-19 pneumonia Acute respiratory failure with hypoxia Stage IV sacral decubitus ulcer: Present on admission Lung nodule Constipation Dysphagia Acute metabolic encephalopathy Parkinson's disease with dementia Severe protein calorie malnutrition Paroxysmal A-fib Hypertension  Plan -As discussed above, patient has been transitioned to comfort measures from 04/14/2021 onwards.  Palliative care following.  Expect in-hospital death.    DVT prophylaxis: None for comfort measures Code Status: DNR Family Communication: None at bedside Disposition Plan: Status is: Inpatient Remains inpatient appropriate because: Of severity of illness.  Expect in-hospital death.  Consultants: Palliative care  Procedures: None  Antimicrobials: None currently.  Completed antibiotic treatment   Subjective: Patient seen and examined at bedside.  Drowsy.  No agitation reported.   Objective: Vitals:   04/19/21 0442 04/19/21 0443 04/19/21 1449 04/19/21 2159  BP: 137/68  (!) 126/58 120/66  Pulse: 80  73 (!) 46   Resp: 16  18 16   Temp: 99.4 F (37.4 C)  98.1 F (36.7 C) 98.3 F (36.8 C)  TempSrc: Oral  Oral Oral  SpO2: 94%  (!) 78% 97%  Weight:  53.1 kg    Height:        Intake/Output Summary (Last 24 hours) at 04/20/2021 0734 Last data filed at 04/20/2021 0700 Gross per 24 hour  Intake 30 ml  Output 350 ml  Net -320 ml    Filed Weights   04/17/21 0438 04/18/21 0500 04/19/21 0443  Weight: 71.7 kg 68 kg 53.1 kg    Examination:  General exam: Looks comfortable with no signs of agitation. Scheduled Meds:  Chlorhexidine Gluconate Cloth  6 each Topical Daily    morphine injection  1 mg Intravenous q12n4p   scopolamine  1 patch Transdermal Q72H   timolol  1 drop Both Eyes QHS   Continuous Infusions:        Aline August, MD [facility] 04/20/2021, 7:34 AM

## 2021-04-20 NOTE — Plan of Care (Signed)

## 2021-04-21 DIAGNOSIS — J189 Pneumonia, unspecified organism: Secondary | ICD-10-CM | POA: Diagnosis not present

## 2021-04-21 DIAGNOSIS — Z515 Encounter for palliative care: Secondary | ICD-10-CM | POA: Diagnosis not present

## 2021-04-21 MED ORDER — LORAZEPAM 2 MG/ML IJ SOLN: 0.5000 mg | INTRAMUSCULAR | Status: DC | PRN

## 2021-04-21 NOTE — TOC Progression Note (Signed)
Transition of Care [facility]) - Progression Note    Patient Details  Name: Chad Russell MRN: [id number] Date of Birth: 1926/03/25  [facility] CM/SW Contact  Leeroy Cha, RN Phone Number: 04/21/2021, 7:53 AM  Clinical Narrative:    Folloing for toc needs.  Family very resistant to [facility] or home for patient.   Expected Discharge Plan: Home/Self Care Barriers to Discharge: Continued Medical Work up  Expected Discharge Plan and Services Expected Discharge Plan: Home/Self Care   Discharge Planning Services: CM Consult   Living arrangements for the past 2 months: Single Family Home                                       Social Determinants of Health (SDOH) Interventions    Readmission Risk Interventions No flowsheet data found.

## 2021-04-21 NOTE — Progress Notes (Signed)
°  PROGRESS NOTE    Chad Russell  [id number]:[id number] DOB: 13-Jun-1926 DOA: 04/01/2021 PCP: Garwin Brothers, MD   Brief Narrative:  88  yo male with PMH PAF, BPH, CAD, CHB, HTN, Parkinson's with dementia, OSA, arthritis, possible aspiration pneumonia in December 2022 requiring admission, overall significant progressive decline since October 2022 and currently bedbound for several weeks presented with worsening mental status.  On presentation, he was found to have possible COVID-19 pneumonia versus aspiration pneumonia.  He was treated with broad-spectrum antibiotics and has completed treatment with remdesivir and completed isolation as well.  Palliative care consulted for goals of care discussion.  Overall prognosis very poor.  He has been switched to comfort measures from 04/14/2021 onwards.  Assessment & Plan:   Comfort measures only status Multifocal pneumonia secondary to aspiration pneumonia and COVID-19 pneumonia Acute respiratory failure with hypoxia Stage IV sacral decubitus ulcer: Present on admission Lung nodule Constipation Dysphagia Acute metabolic encephalopathy Parkinson's disease with dementia Severe protein calorie malnutrition Paroxysmal A-fib Hypertension  Plan -As discussed above, patient has been transitioned to comfort measures from 04/14/2021 onwards.  Palliative care following.  Expect in-hospital death.    DVT prophylaxis: None for comfort measures Code Status: DNR Family Communication: None at bedside Disposition Plan: Status is: Inpatient Remains inpatient appropriate because: Of severity of illness.  Expect in-hospital death.  Consultants: Palliative care  Procedures: None  Antimicrobials: None currently.  Completed antibiotic treatment   Subjective: Patient seen and examined at bedside.  No agitation reported.  Looks drowsy.   Objective: Vitals:   04/19/21 1449 04/19/21 2159 04/20/21 2011 04/21/21 0438  BP: (!) 126/58 120/66 118/61   Pulse: 73 (!) 46  (!) 58   Resp: 18 16 (!) 21   Temp: 98.1 F (36.7 C) 98.3 F (36.8 C) 99.9 F (37.7 C)   TempSrc: Oral Oral Oral   SpO2: (!) 78% 97% 98%   Weight:    59.9 kg  Height:        Intake/Output Summary (Last 24 hours) at 04/21/2021 0724 Last data filed at 04/21/2021 0600 Gross per 24 hour  Intake 0 ml  Output 450 ml  Net -450 ml    Filed Weights   04/18/21 0500 04/19/21 0443 04/21/21 0438  Weight: 68 kg 53.1 kg 59.9 kg    Examination:  General exam: Currently looks comfortable and showing no signs of agitation  scheduled Meds:  Chlorhexidine Gluconate Cloth  6 each Topical Daily    morphine injection  1 mg Intravenous q12n4p   scopolamine  1 patch Transdermal Q72H   timolol  1 drop Both Eyes QHS   Continuous Infusions:        Aline August, MD [facility] 04/21/2021, 7:24 AM

## 2021-04-22 DIAGNOSIS — J189 Pneumonia, unspecified organism: Secondary | ICD-10-CM | POA: Diagnosis not present

## 2021-04-22 NOTE — Progress Notes (Signed)
?Progress Note ? ? ?Patient: Chad Russell [id number]:[id number] DOB: 1926/06/07 DOA: 04/16/2021     28 ?DOS: the patient was seen and examined on 04/22/2021 ?  ?Brief hospital course: ?This 88  yrs old male with PMH significant of PAF, BPH, CAD, CHB, HTN, Parkinson's with dementia, OSA, arthritis, possible aspiration pneumonia in December 2022 requiring admission, Overall significant progressive decline since October 2022 and currently bedbound for several weeks presented with worsening mental status.  On presentation, he was found to have possible COVID-19 pneumonia versus aspiration pneumonia.  He was treated with broad-spectrum antibiotics and has completed treatment with remdesivir and completed isolation as well.  Palliative care consulted for goals of care discussion.  Overall prognosis very poor.  He has been switched to comfort measures from 04/14/2021 onwards.  Anticipate hospital death. ? ? ?Assessment and Plan: ?* Multifocal pneumonia- (present on admission) ?Completed course of antibiotics and remdesivir. ?Care transitioned to comfort care. ? ?Protein-calorie malnutrition, severe- (present on admission) ?Aspiration with TF; core trak has been removed. ? ?Dysphagia ?He underwent SLP eval on 03/26/2021 with overt signs of dysphagia and risk for aspiration. ?Tube feeds have been placed on hold again due to aspiration. No significant improvement. ?Core trak out since 2/13 and will not be replaced, patient has been clearly aspirating, wife understands the risk. ?Care transitioned to comfort care. ? ?Lung nodule ?Multiple lung nodules noted on CT chest (RUL, RLL, LUL, LLL). Also has R>L pleural effusion.  Differential for effusion would include parapneumonic versus malignant. ?Given his extremely frail state, poor functional status, underlying dementia he would be a extremely poor treatment candidate. ?Per radiology fluid collection in right pleura not enough for thoracentesis at this time. ? ?Acute respiratory failure  with hypoxia ([place])- (present on admission) ?Worsening hypoxia due to aspiration, PNA, COVID, severe deconditioning. ? ?Sacral decubitus ulcer - stage 4- (present on admission) ?No cellulitis or osteomyelitis on CT ?Follow wound care consult recommendations ? ? ?Constipation- (present on admission) ?- s/p enema in ER ? ?Parkinson disease ([place])- (present on admission) ?Advanced PD with dementia, significant cerebral atrophy on CT head as well. ?Care transitioned to comfort care.  Sinemet discontinued ? ?Goals of care, counseling/discussion ?Overall poor prognosis and functional status with high risk of aspiration and decompensation.  DNR/DNI. ?Palliative care has been following.  Care transitioned to comfort care. ? ?Atrial fibrillation ([place])- (present on admission) ?Care transitioned to comfort care. ?Xarelto discontinued. ? ?HTN (hypertension)- (present on admission) ?Blood pressure remains stable. ? ? ?COVID-19 virus infection-resolved as of 03/30/2021, (present on admission) ?- continue remdesivir x 5 days (completed on 2/5) ?- CT value 33 on admission  ?- fax received from [facility] that his covid test was positive on 03/01/21 and he completed isolation as well; will d/c isolation at this time  ? ?Acute metabolic encephalopathy-resolved as of 03/27/2021, (present on admission) ?Delirium due to PNA / COVID on top of chronic parkinson's dementia. ? ? ?Nutrition Status: ?Nutrition Problem: Severe Malnutrition ?Etiology: chronic illness (Parkinson's disease) ?Signs/Symptoms: severe fat depletion, severe muscle depletion ?Interventions: Refer to RD note for recommendations ?  ? ?Subjective: Patient was seen and examined at bedside.  Overnight events noted. ?Patient remains comfortable, arousable, not oriented.  Remains on comfort measures. ? ?Physical Exam: ?Vitals:  ? 04/19/21 2159 04/20/21 2011 04/21/21 0438 04/21/21 1630  ?BP: 120/66 118/61  114/63  ?Pulse: (!) 46 (!) 58  (!) 39  ?Resp: 16 (!) 21  12   ?Temp: 98.3 ?F (36.8 ?C) 99.9 ?  F (37.7 ?C)    ?TempSrc: Oral Oral    ?SpO2: 97% 98%  100%  ?Weight:   59.9 kg   ?Height:      ? ?General : Appears comfortable, chronically ill looking, not in any distress. ?Respiratory : Transmitted sounds, decreased air entry, respiratory effort normal. ?Cardiovascular : S1-S2 heard, regular rate and rhythm, no murmur ?Gastrointestinal: Abdomen is soft, nontender, nondistended, BS+ ?Central nervous : Alert, arousable, not following commands. ?Extremities: No edema, no cyanosis, no clubbing ?Psychiatry: Insight, mood, judgment not assessed. ? ? ?Data Reviewed: ?I have Reviewed nursing notes, Vitals, and Lab results since pt's last encounter. Pertinent lab results CBC ?I have ordered test including no labs ?I have reviewed the last note from Dr. Edward Jolly,  ?I have discussed pt's care plan and test results with wife.  ? ?Family Communication: No family at bedside ? ?Disposition: ?Status is: Inpatient ?Remains inpatient appropriate because: Patient admitted with pneumonia secondary to COVID and bacterial pneumonia completed course of treatment.  Patient has poor prognosis palliative care consulted care transitioned to comfort care.  Anticipate hospital discharge. ? ? Planned Discharge Destination:  Anticipate hospital discharge ? ? ?Time spent: 35 minutes ? ?Author: ?Shawna Clamp, MD ?04/22/2021 10:39 AM ? ?For on call review [email or web address].  ? ?

## 2021-04-22 DEATH — deceased

## 2021-04-23 DIAGNOSIS — J189 Pneumonia, unspecified organism: Secondary | ICD-10-CM | POA: Diagnosis not present

## 2021-04-23 NOTE — Progress Notes (Signed)
?Progress Note ? ? ?Patient: Chad Russell EAV:[id number] DOB: 03-08-1926 DOA: 03/29/2021     29 ? ?DOS: the patient was seen and examined on 04/23/2021 ?  ?Brief hospital course: ?This 88  yrs old male with PMH significant of PAF, BPH, CAD, CHB, HTN, Parkinson's with dementia, OSA, arthritis, possible aspiration pneumonia in December 2022 requiring admission, Overall significant progressive decline since October 2022 and currently bedbound for several weeks presented with worsening mental status.  On presentation, he was found to have possible COVID-19 pneumonia versus aspiration pneumonia.  He was treated with broad-spectrum antibiotics and has completed treatment with remdesivir and completed isolation as well.  Palliative care consulted for goals of care discussion.  Overall prognosis very poor.  He has been switched to comfort measures from 04/14/2021 onwards.  Anticipate hospital death. ? ? ?Assessment and Plan: ?* Multifocal pneumonia- (present on admission) ?Completed course of antibiotics and remdesivir. ?Care transitioned to comfort care. ? ?Protein-calorie malnutrition, severe- (present on admission) ?Aspiration with TF; core trak has been removed. ? ?Dysphagia ?He underwent SLP eval on 03/26/2021 with overt signs of dysphagia and risk for aspiration. ?Tube feeds have been placed on hold again due to aspiration. No significant improvement. ?Core trak out since 2/13 and will not be replaced, patient has been clearly aspirating, wife understands the risk. ?Care transitioned to comfort care. ? ?Lung nodule ?Multiple lung nodules noted on CT chest (RUL, RLL, LUL, LLL). Also has R>L pleural effusion.  Differential for effusion would include parapneumonic versus malignant. ?Given his extremely frail state, poor functional status, underlying dementia he would be a extremely poor treatment candidate. ?Per radiology fluid collection in right pleura not enough for thoracentesis at this time. ? ?Acute respiratory  failure with hypoxia ([place])- (present on admission) ?Worsening hypoxia due to aspiration, PNA, COVID, severe deconditioning. ? ?Sacral decubitus ulcer - stage 4- (present on admission) ?No cellulitis or osteomyelitis on CT ?Follow wound care consult recommendations ? ? ?Parkinson disease ([place])- (present on admission) ?Advanced PD with dementia, significant cerebral atrophy on CT head as well. ?Care transitioned to comfort care.  Sinemet discontinued ? ?Goals of care, counseling/discussion ?Overall poor prognosis and functional status with high risk of aspiration and decompensation.  DNR/DNI. ?Palliative care has been following.  Care transitioned to comfort care. ? ?Atrial fibrillation ([place])- (present on admission) ?Care transitioned to comfort care. ?Xarelto discontinued. ? ?HTN (hypertension)- (present on admission) ?Blood pressure remains stable. ? ? ?COVID-19 virus infection-resolved as of 03/30/2021, (present on admission) ?- continue remdesivir x 5 days (completed on 2/5) ?- CT value 33 on admission  ?- fax received from [facility] that his covid test was positive on 03/01/21 and he completed isolation as well; will d/c isolation at this time  ? ?Acute metabolic encephalopathy-resolved as of 03/27/2021, (present on admission) ?Delirium due to PNA / COVID on top of chronic parkinson's dementia. ? ? ?Nutrition Status: ?Nutrition Problem: Severe Malnutrition ?Etiology: chronic illness (Parkinson's disease) ?Signs/Symptoms: severe fat depletion, severe muscle depletion ?Interventions: Refer to RD note for recommendations ?  ? ?Subjective: Patient was seen and examined at bedside.  Overnight events noted. ?Patient remains comfortable, arousable but not oriented.  He remains on comfort care. ? ?Physical Exam: ?Vitals:  ? 04/20/21 2011 04/21/21 7829 04/21/21 1630 04/23/21 0406  ?BP: 118/61  114/63 (!) 109/53  ?Pulse: (!) 58  (!) 39 (!) 42  ?Resp: (!) 21  12 20   ?Temp: 99.9 ?F (37.7 ?C)   98 ?F (36.7 ?C)   ?  TempSrc: Oral   Oral  ?SpO2: 98%  100% (!) 80%  ?Weight:  59.9 kg    ?Height:      ? ?General : Chronically ill looking, appears comfortable, not in any distress. ?Respiratory : Decreased air entry, respiratory effort normal. RR20 ?Cardiovascular : S1-S2 heard, regular rate and rhythm, no murmur ?Gastrointestinal: Abdomen is soft, nontender, nondistended, BS+ ?Central nervous : Alert, arousable, not following commands. ?Extremities: No edema, no cyanosis, no clubbing ?Psychiatry: Insight, mood, judgment not assessed. ? ? ?Data Reviewed: ?I have Reviewed nursing notes, Vitals, and Lab results since pt's last encounter. Pertinent lab results CBC CMP. ?I have reviewed the last note from [facility],  ?I have discussed pt's care plan and test results with family on phone.  ? ?Family Communication: No family at bedside ? ?Disposition: ?Status is: Inpatient ?Remains inpatient appropriate because: Patient admitted with pneumonia secondary to COVID and bacterial pneumonia completed course of treatment.  Patient has poor prognosis,  palliative care consulted and care transitioned to comfort care.  Anticipate hospital discharge. ? ? Planned Discharge Destination:  Anticipate hospital discharge ? ? ?Time spent: 35 minutes ? ?Author: ?Shawna Clamp, MD ?04/23/2021 10:51 AM ? ?For on call review [email or web address]  ? ?

## 2021-04-23 NOTE — Progress Notes (Signed)
Chaplain provided continued support to Juana's wife, who continues to have a difficult time. It is so painful for her to watch him go through this. ? ?Spiritual Care will continue to follow up, but please page as needs arise. ? ?[employer], Bcc ?Pager, [phone] ?

## 2021-04-24 DIAGNOSIS — J189 Pneumonia, unspecified organism: Secondary | ICD-10-CM | POA: Diagnosis not present

## 2021-04-24 NOTE — Progress Notes (Signed)
?  04/24/21 1600  ?Clinical Encounter Type  ?Visited With Patient and family together  ?Visit Type Follow-up;Psychological support;Spiritual support  ?Referral From Chaplain  ?Consult/Referral To Chaplain  ?Recommendations Anticipatory Grief  ?Spiritual Encounters  ?Spiritual Needs Sacred text;Prayer;Ritual;Emotional;Grief support  ?Stress Factors  ?Patient Stress Factors Loss of control  ?Family Stress Factors Exhausted;Health changes;Loss;Loss of control;Major life changes  ? ?Met with Wife Chad Russell and patient Chad Russell at his bedside today.  Chad Russell was able to share her struggles with watching her husband of 69 years suffer.  She spoke of her emotional and spiritual distress.  Complicated grief that "this is a horrible place to suffer like this - it's gone on too long and he needs to be more comfortable."  She expressed her anticipatory grief, of not having children and has taken care of her husband for 3 years of illness.  She appeared stressed and exhausted.  Chaplain provided an opportunity for her to express her emotional and spiritual distress.  ?Anointed patient with oil and provided a departing blessing.   ?

## 2021-04-24 NOTE — Progress Notes (Signed)
?Progress Note ? ? ?Patient: Chad Russell GLO:[id number] DOB: 11-30-1926 DOA: 04/18/2021     30 ? ?DOS: the patient was seen and examined on 04/24/2021 ?  ?Brief hospital course: ?This 88  yrs old male with PMH significant of PAF, BPH, CAD, CHB, HTN, Parkinson's with dementia, OSA, arthritis, possible aspiration pneumonia in December 2022 requiring admission, Overall significant progressive decline since October 2022 and currently bedbound for several weeks presented with worsening mental status.  On presentation, he was found to have possible COVID-19 pneumonia versus aspiration pneumonia.  He was treated with broad-spectrum antibiotics and has completed treatment with remdesivir and completed isolation as well.  Palliative care consulted for goals of care discussion.  Overall prognosis very poor.  He has been switched to comfort measures from 04/14/2021 onwards.  Anticipate hospital death. ? ? ?Assessment and Plan: ?* Multifocal pneumonia ?Completed course of antibiotics and remdesivir. ?Care transitioned to comfort care. ? ?Protein-calorie malnutrition, severe ?Aspiration with TF; core trak has been removed. ? ?Dysphagia ? ?Care transitioned to comfort care. ? ?Lung nodule ?Multiple lung nodules noted on CT chest (RUL, RLL, LUL, LLL). Also has R>L pleural effusion.  Differential for effusion would include parapneumonic versus malignant. ?Given his extremely frail state, poor functional status, underlying dementia he would be a extremely poor treatment candidate. ?Per radiology fluid collection in right pleura not enough for thoracentesis at this time. ? ?Acute respiratory failure with hypoxia ([place]) ?Worsening hypoxia due to aspiration, PNA, COVID, severe deconditioning. ? ?Sacral decubitus ulcer - stage 4 ?No cellulitis or osteomyelitis on CT ?Follow wound care consult recommendations ? ? ?Parkinson disease ([place]) ?Advanced PD with dementia, significant cerebral atrophy on CT head as well. ?Care transitioned to  comfort care.  Sinemet discontinued ? ?Goals of care, counseling/discussion ?Overall poor prognosis and functional status with high risk of aspiration and decompensation.  DNR/DNI. ?Palliative care has been following.  Care transitioned to comfort care. ? ?Atrial fibrillation ([place]) ?Care transitioned to comfort care. ?Xarelto discontinued. ? ?HTN (hypertension) ?Blood pressure remains stable. ? ? ?COVID-19 virus infection-resolved as of 03/30/2021 ?- continue remdesivir x 5 days (completed on 2/5) ?- CT value 33 on admission  ?- fax received from [facility] that his covid test was positive on 03/01/21 and he completed isolation as well; will d/c isolation at this time  ? ?Acute metabolic encephalopathy-resolved as of 03/27/2021 ?Delirium due to PNA / COVID on top of chronic parkinson's dementia. ? ? ?Nutrition Status: ?Nutrition Problem: Severe Malnutrition ?Etiology: chronic illness (Parkinson's disease) ?Signs/Symptoms: severe fat depletion, severe muscle depletion ?Interventions: Refer to RD note for recommendations ?  ? ?Subjective: Patient was seen and examined at bedside.  Overnight events noted. ?Patient remains comfortable, arousable but not oriented.  Remains on comfort care. ? ?Physical Exam: ?Vitals:  ? 04/23/21 1848 04/23/21 2200 04/24/21 0756 04/24/21 1000  ?BP: (!) 128/56 135/82 (!) 95/56 (!) 111/59  ?Pulse: (!) 45 (!) 45 (!) 127 (!) 45  ?Resp: 15 20 18 18   ?Temp: 98.4 ?F (36.9 ?C) 98.5 ?F (36.9 ?C) 98.3 ?F (36.8 ?C) 98.5 ?F (36.9 ?C)  ?TempSrc: Oral Axillary Axillary Axillary  ?SpO2:  100% 96% 95%  ?Weight:      ?Height:      ? ?General : Chronically ill looking, appears comfortable, arousable. ?Respiratory : Decreased air entry, respiratory effort normal. RR20 ?Cardiovascular : S1-S2 heard, regular rate and rhythm, no murmur ?Gastrointestinal: Abdomen is soft, non tender, non distended, BS+ ?Central nervous : Alert, arousable, not following  commands. ?Extremities: No edema, no cyanosis, no  clubbing ?Psychiatry: Insight, mood, judgment not assessed. ? ? ?Data Reviewed: ?I have Reviewed nursing notes, Vitals, and Lab results since pt's last encounter. Pertinent lab results CBC CMP. ?I have reviewed the last note from [facility],  ?I have discussed pt's care plan and test results with family on phone.  ? ?Family Communication: No family at bedside ? ?Disposition: ?Status is: Inpatient ?Remains inpatient appropriate because: Patient admitted with pneumonia secondary to COVID and bacterial pneumonia completed course of treatment.  Patient has poor prognosis,  palliative care consulted and care transitioned to comfort care.  Anticipate hospital discharge. ? ? Planned Discharge Destination:  Anticipate hospital discharge ? ? ?Time spent: 25 minutes ? ?Author: ?Shawna Clamp, MD ?04/24/2021 11:16 AM ? ?For on call review [email or web address]  ? ?

## 2021-04-25 DIAGNOSIS — J189 Pneumonia, unspecified organism: Secondary | ICD-10-CM | POA: Diagnosis not present

## 2021-04-25 NOTE — Progress Notes (Signed)
Received report from [place], [place]. Assuming care of Patient. Patient is resting in bed. Respirations are normal to Agonal type mouth breathing. Will continue to monitor.  ?

## 2021-04-25 NOTE — Progress Notes (Signed)
?Progress Note ? ? ?Patient: Chad Russell MCN:[id number] DOB: June 08, 1926 DOA: 03/29/2021     31 ? ?DOS: the patient was seen and examined on 04/25/2021 ?  ?Brief hospital course: ?This 88  yrs old male with PMH significant of PAF, BPH, CAD, CHB, HTN, Parkinson's with dementia, OSA, arthritis, possible aspiration pneumonia in December 2022 requiring admission, Overall significant progressive decline since October 2022 and currently bedbound for several weeks presented with worsening mental status.  On presentation, he was found to have possible COVID-19 pneumonia versus aspiration pneumonia.  He was treated with broad-spectrum antibiotics and has completed treatment with remdesivir and completed isolation as well.  Palliative care consulted for goals of care discussion.  Overall prognosis very poor.  He has been switched to comfort measures from 04/14/2021 onwards.  Anticipate hospital death. ? ? ?Assessment and Plan: ?* Multifocal pneumonia ?Completed course of antibiotics and remdesivir. ?Care transitioned to comfort care. ? ?Protein-calorie malnutrition, severe ?Aspiration with TF; core trak has been removed. ? ?Dysphagia ? ?Care transitioned to comfort care. ? ?Lung nodule ?Multiple lung nodules noted on CT chest (RUL, RLL, LUL, LLL). Also has R>L pleural effusion.  Differential for effusion would include parapneumonic versus malignant. ?Given his extremely frail state, poor functional status, underlying dementia he would be a extremely poor treatment candidate. ?Per radiology fluid collection in right pleura not enough for thoracentesis at this time. ? ?Acute respiratory failure with hypoxia ([place]) ?Worsening hypoxia due to aspiration, PNA, COVID, severe deconditioning. ? ?Sacral decubitus ulcer - stage 4 ?No cellulitis or osteomyelitis on CT ?Follow wound care consult recommendations ? ? ?Parkinson disease ([place]) ?Advanced PD with dementia, significant cerebral atrophy on CT head as well. ?Care transitioned to  comfort care.  Sinemet discontinued ? ?Goals of care, counseling/discussion ?Overall poor prognosis and functional status with high risk of aspiration and decompensation.  DNR/DNI. ?Palliative care has been following.  Care transitioned to comfort care. ? ?Atrial fibrillation ([place]) ?Care transitioned to comfort care. ?Xarelto discontinued. ? ?HTN (hypertension) ?Blood pressure remains stable. ? ? ?COVID-19 virus infection-resolved as of 03/30/2021 ?- continue remdesivir x 5 days (completed on 2/5) ?- CT value 33 on admission  ?- fax received from [facility] that his covid test was positive on 03/01/21 and he completed isolation as well; will d/c isolation at this time  ? ?Acute metabolic encephalopathy-resolved as of 03/27/2021 ?Delirium due to PNA / COVID on top of chronic parkinson's dementia. ? ? ?Nutrition Status: ?Nutrition Problem: Severe Malnutrition ?Etiology: chronic illness (Parkinson's disease) ?Signs/Symptoms: severe fat depletion, severe muscle depletion ?Interventions: Refer to RD note for recommendations ?  ? ?Subjective: Patient was seen and examined at bedside.  Overnight events noted. ?Patient remains comfortable.  He is on comfort care.  Respiration normal to agonal. ? ?Physical Exam: ?Vitals:  ? 04/24/21 0756 04/24/21 1000 04/24/21 2019 04/25/21 0843  ?BP: (!) 95/56 (!) 111/59 105/64 (!) 80/41  ?Pulse: (!) 127 (!) 45 67 (!) 43  ?Resp: 18 18 (!) 22 20  ?Temp: 98.3 ?F (36.8 ?C) 98.5 ?F (36.9 ?C) (!) 101 ?F (38.3 ?C)   ?TempSrc: Axillary Axillary Oral   ?SpO2: 96% 95% (!) 82% 99%  ?Weight:      ?Height:      ? ?General : Chronically ill looking, appears comfortable, arousable. ?Respiratory : Decreased air entry, respiratory effort normal. RR20 ?Cardiovascular : S1-S2 heard, regular rate and rhythm, no murmur ?Gastrointestinal: Abdomen is soft, non tender, non distended, BS+ ?Central nervous : Alert, arousable, not  following commands. ?Extremities: No edema, no cyanosis, no  clubbing ?Psychiatry: Insight, mood, judgment not assessed. ? ? ?Data Reviewed: ?I have Reviewed nursing notes, Vitals, and Lab results since pt's last encounter. Pertinent lab results CBC CMP. ?I have reviewed the last note from [facility],  ?I have discussed pt's care plan and test results with family on phone.  ? ?Family Communication: No family at bedside ? ?Disposition: ?Status is: Inpatient ?Remains inpatient appropriate because: Patient admitted with pneumonia secondary to COVID and bacterial pneumonia completed course of treatment.  Patient has poor prognosis,  palliative care consulted and care transitioned to comfort care.  Anticipate hospital discharge. ? ? Planned Discharge Destination:  Anticipate hospital discharge ? ? ?Time spent: 25 minutes ? ?Author: ?Shawna Clamp, MD ?04/25/2021 1:30 PM ? ?For on call review [email or web address]  ? ?

## 2021-04-25 NOTE — Plan of Care (Signed)

## 2021-04-26 DIAGNOSIS — J189 Pneumonia, unspecified organism: Secondary | ICD-10-CM | POA: Diagnosis not present

## 2021-04-26 NOTE — Progress Notes (Signed)
?Progress Note ? ? ?Patient: Chad Russell MPN:[id number] DOB: [birth year] DOA: 04/20/2021     32 ? ?DOS: the patient was seen and examined on 04/26/2021 ?  ?Brief hospital course: ?This 88  yrs old male with PMH significant of PAF, BPH, CAD, CHB, HTN, Parkinson's with dementia, OSA, arthritis, possible aspiration pneumonia in December 2022 requiring admission, Overall significant progressive decline since October 2022 and currently bedbound for several weeks presented with worsening mental status.  On presentation, he was found to have possible COVID-19 pneumonia versus aspiration pneumonia.  He was treated with broad-spectrum antibiotics and has completed treatment with remdesivir and completed isolation as well.  Palliative care consulted for goals of care discussion.  Overall prognosis very poor.  He has been switched to comfort measures from 04/14/2021 onwards.  Anticipate hospital death. ? ? ?Assessment and Plan: ?* Multifocal pneumonia ?Completed course of antibiotics and remdesivir. ?Care transitioned to comfort care. ? ?Protein-calorie malnutrition, severe ?Aspiration with TF; core trak has been removed. ? ?Dysphagia ? ?Care transitioned to comfort care. ? ?Acute respiratory failure with hypoxia ([place]) ?Worsening hypoxia due to aspiration, PNA, COVID, severe deconditioning.  Continue supplemental oxygen. ? ?Sacral decubitus ulcer - stage 4 ?No cellulitis or osteomyelitis on CT ?Follow wound care consult recommendations ? ? ?Parkinson disease ([place]) ?Advanced PD with dementia, significant cerebral atrophy on CT head as well. ?Care transitioned to comfort care.  Sinemet discontinued ? ?Goals of care, counseling/discussion ?Overall poor prognosis and functional status with high risk of aspiration and decompensation.  DNR/DNI. ?Palliative care has been following.  Care transitioned to comfort care. ? ?Atrial fibrillation ([place]) ?Care transitioned to comfort care. ?Xarelto discontinued. ? ?HTN (hypertension) ?Blood  pressure remains soft. ? ? ?COVID-19 virus infection-resolved as of 03/30/2021 ?- continue remdesivir x 5 days (completed on 2/5) ?- CT value 33 on admission  ?- fax received from [place] that his covid test was positive on 03/01/21 and he completed isolation as well; will d/c isolation at this time  ? ?Acute metabolic encephalopathy-resolved as of 03/27/2021 ?Delirium due to PNA / COVID on top of chronic parkinson's dementia. ? ? ?Nutrition Status: ?Nutrition Problem: Severe Malnutrition ?Etiology: chronic illness (Parkinson's disease) ?Signs/Symptoms: severe fat depletion, severe muscle depletion ?Interventions: Refer to RD note for recommendations ?  ? ?Subjective: Patient was seen and examined at bedside.  Overnight events noted. ?Patient remains comfortable.  Respirations normal to agonal.  He is on comfort care ? ?Physical Exam: ?Vitals:  ? 04/24/21 0756 04/24/21 1000 04/24/21 2019 04/25/21 0843  ?BP: (!) 95/56 (!) 111/59 105/64 (!) 80/41  ?Pulse: (!) 127 (!) 45 67 (!) 43  ?Resp: 18 18 (!) 22 20  ?Temp: 98.3 ?F (36.8 ?C) 98.5 ?F (36.9 ?C) (!) 101 ?F (38.3 ?C)   ?TempSrc: Axillary Axillary Oral   ?SpO2: 96% 95% (!) 82% 99%  ?Weight:      ?Height:      ? ?General : Chronically ill looking, appears comfortable, arousable. ?Respiratory : Decreased air entry, respiratory effort normal, RR 15 ?Cardiovascular : S1-S2 heard, regular rate and rhythm, no murmur ?Gastrointestinal: Abdomen is soft, non tender, non distended, BS+ ?Central nervous : Alert, arousable, not following commands. ?Extremities: No edema, no cyanosis, no clubbing ?Psychiatry: Insight, mood, judgment not assessed. ? ? ?Data Reviewed: ?I have Reviewed nursing notes, Vitals, and Lab results since pt's last encounter. Pertinent lab results CBC CMP ?I have discussed pt's care plan and test results with family.  ? ?Family Communication: Wife ? ?Disposition: ?  Status is: Inpatient ?Remains inpatient appropriate because: Patient admitted with  pneumonia secondary to COVID and bacterial pneumonia completed course of treatment.  Patient has poor prognosis,  palliative care consulted and care transitioned to comfort care.  Anticipate hospital discharge. ? ?Planned Discharge Destination:  Anticipate hospital discharge ? ? ?Time spent: 25 minutes ? ?Author: ?Shawna Clamp, MD ?04/26/2021 10:26 AM ? ?For on call review [email or web address]  ? ?

## 2021-05-18 ENCOUNTER — Encounter (INDEPENDENT_AMBULATORY_CARE_PROVIDER_SITE_OTHER): Payer: Medicare HMO | Admitting: Ophthalmology

## 2021-05-23 NOTE — Progress Notes (Signed)
Chaplain provided support to [name] at [facility] bedside in the [place].  Chaplain engaged in compassionate and therapeutic care for her as she processed [name]'s medical care and memory.  She voiced that [place] and [place] [employer].  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  [name] talked about having great memories and times with [name].  Later she noted that [place] had rebuilt and reformed the home they live in now.  He had been able to take something that everyone thought should be torn down and revitalize it.  [name] uplifted the mind and brain of her husband, noting his extraordinary intellect.  ? ?Chaplain provided space for her to share her grief and anger, while affirming her love and advocacy for her husband.  Chaplain was able to to help her get to her vehicle and connect with [employer] for [name]'s body.  Chaplain provided more information to the nursing staff to assist in that process.   ? ?Chaplain worked to be a safe and brave space for [name] through the loss of her dear husband.  ? ? ? 05-15-2021 1100  ?Clinical Encounter Type  ?Visited With Family  ?Visit Type Death;Spiritual support  ?Referral From Nurse  ?Consult/Referral To Chaplain  ?Spiritual Encounters  ?Spiritual Needs Emotional;Grief support;Prayer  ?Stress Factors  ?Family Stress Factors Loss of control;Major life changes  ? ? ?

## 2021-05-23 NOTE — Progress Notes (Signed)
Spoke with Proofreader at [facility] for body donation. Questions regarding body status answered. Per Estill Bamberg they will follow back up with patient spouse regarding acceptance or nonacceptance. [place] [facility] also provided bed placement number if body is accepted.    ?

## 2021-05-23 NOTE — Progress Notes (Signed)
Pt passed away @ 0105 am, HR=0 upon auscultation, P=0 RR=0, pronounced dead with another RN [employer]. Wife notified and aware. NP on call notified. ?

## 2021-05-23 NOTE — Death Summary Note (Addendum)
DEATH SUMMARY   Patient Details  Name: Chad Russell MRN: [id number] DOB: February 06, 1927 [id number], Marlene Lard, MD  Admission/Discharge Information   Admit Date:  April 22, 2021  Date of Death: Date of Death: 25-May-2021  Time of Death: Time of Death: 0105  Length of Stay: 2031/05/16   Principle Cause of death: Aspiration Pneumonia                                             Acute metabolic encaphalopathy                                             Hospital Diagnoses: Principal Problem:   Multifocal pneumonia Active Problems:   HTN (hypertension)   Atrial fibrillation ([place])   Goals of care, counseling/discussion   Parkinson disease ([place])   Constipation   Sacral decubitus ulcer - stage 4   Acute respiratory failure with hypoxia (HCC)   Lung nodule   Dysphagia   Protein-calorie malnutrition, severe   Comfort measures only status  Hospital Course: This 88  yrs old male with PMH significant of PAF, BPH, CAD, CHB, HTN, Parkinson's with dementia, OSA, arthritis, possible aspiration pneumonia in December 2022 requiring admission, Overall significant progressive decline since October 2022 and currently bedbound for several weeks presented with worsening mental status.  On presentation, he was found to have possible COVID-19 pneumonia versus aspiration pneumonia.  He was treated with broad-spectrum antibiotics and has completed treatment with remdesivir and completed isolation as well.  Palliative care consulted for goals of care discussion.  Overall prognosis very poor. He has been switched to comfort measures from 04/14/2021 onwards.  Anticipate hospital death.  Patient passed away on 05-25-21 at 1:05 AM.  Family was notified.   Assessment and Plan: * Multifocal pneumonia Completed course of antibiotics and remdesivir. Care transitioned to comfort care.  Protein-calorie malnutrition, severe Aspiration with TF; core trak has been removed.  Dysphagia  Care transitioned to comfort care.  Acute  respiratory failure with hypoxia (HCC) Worsening hypoxia due to aspiration, PNA, COVID, severe deconditioning.  Continue supplemental oxygen.  Sacral decubitus ulcer - stage 4 No cellulitis or osteomyelitis on CT Follow wound care consult recommendations   Parkinson disease ([place]) Advanced PD with dementia, significant cerebral atrophy on CT head as well. Care transitioned to comfort care.  Sinemet discontinued  Goals of care, counseling/discussion Overall poor prognosis and functional status with high risk of aspiration and decompensation.  DNR/DNI. Palliative care has been following.  Care transitioned to comfort care.  Atrial fibrillation [facility]) Care transitioned to comfort care. Xarelto discontinued.  HTN (hypertension) Blood pressure remains soft.   COVID-19 virus infection-resolved as of 03/30/2021 - continue remdesivir x 5 days (completed on 2/5) - CT value 33 on admission  - fax received from [facility] that his covid test was positive on 03/01/21 and he completed isolation as well; will d/c isolation at this time   Acute metabolic encephalopathy-resolved as of 03/27/2021 Delirium due to PNA / COVID on top of chronic parkinson's dementia.   Procedures: None  The results of significant diagnostics from this hospitalization (including imaging, microbiology, ancillary and laboratory) are listed below for reference.   Significant Diagnostic Studies: DG Chest 1 View  Result Date: 04/06/2021 CLINICAL DATA:  Increased shortness of breath this morning EXAM: CHEST  1 VIEW COMPARISON:  04/01/2021 chest x-ray and abdominal radiograph from 2 days ago FINDINGS: Increased indistinct pulmonary opacity which is both hazy and interstitial. Calcified pleural plaques and possible small pleural effusion, especially on the left. Feeding tube with tip at the lower mediastinum. Cardiomegaly and aortic tortuosity. These results will be called to the ordering clinician or representative  by the Radiologist Assistant, and communication documented in the PACS or [employer]. IMPRESSION: 1. Worsening bilateral pulmonary opacity with patchy appearance favoring pneumonia. Superimposed edema is not excluded in the setting of chronic marked cardiomegaly. 2. Shortening of feeding tube with tip at the lower esophagus. Electronically Signed   By: Jorje Guild M.D.   On: 04/06/2021 05:56   DG Abd 1 View  Result Date: 04/04/2021 CLINICAL DATA:  Nasogastric placement. EXAM: ABDOMEN - 1 VIEW COMPARISON:  Earlier same day FINDINGS: Soft feeding tube enters the stomach with its tip in the mid body. Note is made of worsening bilateral pulmonary infiltrates, particularly in the left lower lobe. IMPRESSION: Soft feeding tube tip in the mid body of the stomach. Worsening of bilateral pulmonary infiltrates, particularly in the left lower lobe. Electronically Signed   By: Nelson Chimes M.D.   On: 04/04/2021 23:23   DG Abd 1 View  Result Date: 04/04/2021 CLINICAL DATA:  NG placement. EXAM: ABDOMEN - 1 VIEW COMPARISON:  Chest radiograph dated 04/01/2021. FINDINGS: Enteric tube with weighted tip over the lower mediastinum in similar position as the radiograph of 04/01/2021 in the distal esophagus. Recommend further advancing by additional 14 cm. Consolidative changes of the visualized left lung base. IMPRESSION: Enteric tube with weighted tip in the distal esophagus. Recommend further advancing by additional 14 cm. Electronically Signed   By: Anner Crete M.D.   On: 04/04/2021 21:20   [place] CHEST (PLEURAL EFFUSION)  Result Date: 03/28/2021 CLINICAL DATA:  COVID-19 pneumonia. Please perform chest ultrasound and ultrasound-guided thoracentesis as indicated. EXAM: CHEST ULTRASOUND COMPARISON:  Chest CT-03/28/2021; CT abdomen pelvis-01/08/2021 FINDINGS: Sonographic evaluation demonstrates a trace amount bilateral pleural fluid, right slightly greater than left, both too small to allow for safe  ultrasound-guided thoracentesis. No thoracentesis attempted. IMPRESSION: Trace bilateral pleural effusions, right greater than left, too small to allow for safe ultrasound-guided thoracentesis. Electronically Signed   By: Sandi Mariscal M.D.   On: 03/28/2021 17:25   IR US Guide Vasc Access Left  Result Date: 03/31/2021 INDICATION: Request for PICC placement due to poor venous access. EXAM: ULTRASOUND AND FLUOROSCOPIC GUIDED PICC LINE INSERTION MEDICATIONS: 4 mL 1% lidocaine CONTRAST:  None FLUOROSCOPY TIME:  154 seconds (3.7 mGy) COMPLICATIONS: None immediate. TECHNIQUE: The procedure, risks, benefits, and alternatives were explained to the patient and informed written consent was obtained. The left upper extremity was prepped with chlorhexidine in a sterile fashion, and a sterile drape was applied covering the operative field. Maximum barrier sterile technique with sterile gowns and gloves were used for the procedure. A timeout was performed prior to the initiation of the procedure. Local anesthesia was provided with 1% lidocaine. After the overlying soft tissues were anesthetized with 1% lidocaine, a micropuncture kit was utilized to access the left brachial vein. Real-time ultrasound guidance was utilized for vascular access including the acquisition of a permanent ultrasound image documenting patency of the accessed vessel. A guidewire was advanced to the level of the superior caval-atrial junction for measurement purposes and the PICC line was cut to length. A peel-away sheath was placed and  a 41 cm, 5 [place], dual lumen was inserted to level of the superior caval-atrial junction. A post procedure spot fluoroscopic was obtained. The catheter easily aspirated and flushed and was secured in place. A dressing was placed. The patient tolerated the procedure well without immediate post procedural complication. FINDINGS: After catheter placement, the tip lies within the superior cavoatrial junction. The catheter  aspirates and flushes normally and is ready for immediate use. IMPRESSION: Successful ultrasound and fluoroscopic guided placement of a left brachial vein approach, 41 cm, 5 French, dual lumen PICC with tip at the superior caval-atrial junction. The PICC line is ready for immediate use. Read by: Narda Rutherford, AGNP-BC Electronically Signed   By: Miachel Roux M.D.   On: 03/31/2021 09:29   DG CHEST PORT 1 VIEW  Result Date: 04/01/2021 CLINICAL DATA:  Aspiration into airway. EXAM: PORTABLE CHEST 1 VIEW COMPARISON:  04/09/2021 FINDINGS: Cardiac enlargement. No vascular congestion. Bilateral perihilar infiltrates, similar to prior study, possibly aspiration or edema. Small bilateral pleural effusions. Bilateral calcified pleural plaques. There has been interval insertion of a left central venous catheter with tip projected over the expected area of confluence of the brachiocephalic and superior vena cava. No pneumothorax. An enteric tube has been inserted with tip projected over the lower mediastinum consistent with location in the distal esophagus. Advancement is recommended if placement in the stomach is desired. Calcification of the aorta. IMPRESSION: 1. Cardiac enlargement. Bilateral perihilar infiltrates may indicate edema or aspiration. 2. Small bilateral pleural effusions. 3. Appliances positioned as described. Electronically Signed   By: Lucienne Capers M.D.   On: 04/01/2021 23:33   IR PICC PLACEMENT LEFT >5 YRS INC IMG GUIDE  Result Date: 03/31/2021 INDICATION: Request for PICC placement due to poor venous access. EXAM: ULTRASOUND AND FLUOROSCOPIC GUIDED PICC LINE INSERTION MEDICATIONS: 4 mL 1% lidocaine CONTRAST:  None FLUOROSCOPY TIME:  154 seconds (3.7 mGy) COMPLICATIONS: None immediate. TECHNIQUE: The procedure, risks, benefits, and alternatives were explained to the patient and informed written consent was obtained. The left upper extremity was prepped with chlorhexidine in a sterile fashion, and a  sterile drape was applied covering the operative field. Maximum barrier sterile technique with sterile gowns and gloves were used for the procedure. A timeout was performed prior to the initiation of the procedure. Local anesthesia was provided with 1% lidocaine. After the overlying soft tissues were anesthetized with 1% lidocaine, a micropuncture kit was utilized to access the left brachial vein. Real-time ultrasound guidance was utilized for vascular access including the acquisition of a permanent ultrasound image documenting patency of the accessed vessel. A guidewire was advanced to the level of the superior caval-atrial junction for measurement purposes and the PICC line was cut to length. A peel-away sheath was placed and a 41 cm, 5 [place], dual lumen was inserted to level of the superior caval-atrial junction. A post procedure spot fluoroscopic was obtained. The catheter easily aspirated and flushed and was secured in place. A dressing was placed. The patient tolerated the procedure well without immediate post procedural complication. FINDINGS: After catheter placement, the tip lies within the superior cavoatrial junction. The catheter aspirates and flushes normally and is ready for immediate use. IMPRESSION: Successful ultrasound and fluoroscopic guided placement of a left brachial vein approach, 41 cm, 5 French, dual lumen PICC with tip at the superior caval-atrial junction. The PICC line is ready for immediate use. Read by: Narda Rutherford, AGNP-BC Electronically Signed   By: Miachel Roux M.D.   On: 03/31/2021 09:29  [place] EKG SITE RITE  Result Date: 03/29/2021 If Site Rite image not attached, placement could not be confirmed due to current cardiac rhythm.   Microbiology: No results found for this or any previous visit (from the past 240 hour(s)).  Time spent: 15 minutes  Signed: Shawna Clamp, MD 05/16/2021

## 2021-05-23 NOTE — Progress Notes (Signed)
Per wife, pt is a full donor at [facility] [phone]. Wife was instructed by [facility] to make arrangement and notify [place] about the donor process. ?

## 2021-05-23 DEATH — deceased
